# Patient Record
Sex: Female | Born: 1945
Health system: Southern US, Community
[De-identification: ages and names within clinical notes are randomized; demographics above are authoritative.]

## PROBLEM LIST (undated history)

## (undated) DIAGNOSIS — I219 Acute myocardial infarction, unspecified: Secondary | ICD-10-CM

## (undated) DIAGNOSIS — Z972 Presence of dental prosthetic device (complete) (partial): Secondary | ICD-10-CM

## (undated) DIAGNOSIS — G9340 Encephalopathy, unspecified: Secondary | ICD-10-CM

## (undated) DIAGNOSIS — I10 Essential (primary) hypertension: Secondary | ICD-10-CM

## (undated) DIAGNOSIS — E039 Hypothyroidism, unspecified: Secondary | ICD-10-CM

## (undated) DIAGNOSIS — E785 Hyperlipidemia, unspecified: Secondary | ICD-10-CM

## (undated) DIAGNOSIS — I4891 Unspecified atrial fibrillation: Secondary | ICD-10-CM

## (undated) DIAGNOSIS — T4145XA Adverse effect of unspecified anesthetic, initial encounter: Secondary | ICD-10-CM

## (undated) DIAGNOSIS — I639 Cerebral infarction, unspecified: Secondary | ICD-10-CM

## (undated) DIAGNOSIS — J45909 Unspecified asthma, uncomplicated: Secondary | ICD-10-CM

## (undated) DIAGNOSIS — T792XXA Traumatic secondary and recurrent hemorrhage and seroma, initial encounter: Secondary | ICD-10-CM

## (undated) DIAGNOSIS — K219 Gastro-esophageal reflux disease without esophagitis: Secondary | ICD-10-CM

## (undated) DIAGNOSIS — R7611 Nonspecific reaction to tuberculin skin test without active tuberculosis: Secondary | ICD-10-CM

## (undated) DIAGNOSIS — D649 Anemia, unspecified: Secondary | ICD-10-CM

## (undated) DIAGNOSIS — N186 End stage renal disease: Secondary | ICD-10-CM

## (undated) DIAGNOSIS — C801 Malignant (primary) neoplasm, unspecified: Secondary | ICD-10-CM

## (undated) DIAGNOSIS — R569 Unspecified convulsions: Secondary | ICD-10-CM

## (undated) DIAGNOSIS — I2510 Atherosclerotic heart disease of native coronary artery without angina pectoris: Secondary | ICD-10-CM

## (undated) DIAGNOSIS — I739 Peripheral vascular disease, unspecified: Secondary | ICD-10-CM

## (undated) DIAGNOSIS — G4730 Sleep apnea, unspecified: Secondary | ICD-10-CM

## (undated) DIAGNOSIS — I272 Pulmonary hypertension, unspecified: Secondary | ICD-10-CM

## (undated) HISTORY — PX: OTHER SURGICAL HISTORY: SHX169

## (undated) HISTORY — PX: NEPHRECTOMY: SHX65

## (undated) HISTORY — DX: Hyperlipidemia, unspecified: E78.5

## (undated) HISTORY — PX: MULTIPLE TOOTH EXTRACTIONS: SHX2053

## (undated) HISTORY — DX: Hypothyroidism, unspecified: E03.9

## (undated) HISTORY — PX: CHOLECYSTECTOMY: SHX55

## (undated) HISTORY — DX: Peripheral vascular disease, unspecified: I73.9

## (undated) HISTORY — PX: COLONOSCOPY: SHX174

## (undated) HISTORY — PX: CARDIAC CATHETERIZATION: SHX172

## (undated) HISTORY — PX: ABDOMINAL HYSTERECTOMY: SHX81

## (undated) HISTORY — DX: Unspecified convulsions: R56.9

## (undated) HISTORY — DX: Cerebral infarction, unspecified: I63.9

## (undated) HISTORY — PX: TONSILLECTOMY: SUR1361

## (undated) HISTORY — DX: Atherosclerotic heart disease of native coronary artery without angina pectoris: I25.10

## (undated) HISTORY — PX: TUBAL LIGATION: SHX77

## (undated) HISTORY — PX: APPENDECTOMY: SHX54

## (undated) HISTORY — DX: Malignant (primary) neoplasm, unspecified: C80.1

## (undated) HISTORY — DX: Nonspecific reaction to tuberculin skin test without active tuberculosis: R76.11

---

## 2001-01-02 DIAGNOSIS — I639 Cerebral infarction, unspecified: Secondary | ICD-10-CM

## 2001-01-02 HISTORY — DX: Cerebral infarction, unspecified: I63.9

## 2005-03-21 ENCOUNTER — Encounter: Payer: Self-pay | Admitting: Cardiology

## 2005-04-11 ENCOUNTER — Encounter: Payer: Self-pay | Admitting: Cardiology

## 2005-05-23 ENCOUNTER — Encounter: Payer: Self-pay | Admitting: Cardiology

## 2005-07-22 ENCOUNTER — Encounter: Payer: Self-pay | Admitting: Cardiology

## 2006-06-08 ENCOUNTER — Ambulatory Visit: Payer: Self-pay | Admitting: Nephrology

## 2007-02-19 ENCOUNTER — Encounter: Payer: Self-pay | Admitting: Cardiology

## 2008-02-19 ENCOUNTER — Encounter: Payer: Self-pay | Admitting: Cardiology

## 2008-05-22 ENCOUNTER — Encounter: Payer: Self-pay | Admitting: Cardiology

## 2008-07-20 ENCOUNTER — Encounter: Payer: Self-pay | Admitting: Cardiology

## 2008-07-22 ENCOUNTER — Ambulatory Visit: Payer: Self-pay | Admitting: Cardiology

## 2008-07-24 ENCOUNTER — Ambulatory Visit: Payer: Self-pay | Admitting: Cardiology

## 2008-07-27 DIAGNOSIS — I739 Peripheral vascular disease, unspecified: Secondary | ICD-10-CM | POA: Insufficient documentation

## 2008-07-27 DIAGNOSIS — F17200 Nicotine dependence, unspecified, uncomplicated: Secondary | ICD-10-CM

## 2008-07-27 DIAGNOSIS — N186 End stage renal disease: Secondary | ICD-10-CM

## 2008-07-27 DIAGNOSIS — Z8673 Personal history of transient ischemic attack (TIA), and cerebral infarction without residual deficits: Secondary | ICD-10-CM | POA: Insufficient documentation

## 2008-07-27 DIAGNOSIS — I2510 Atherosclerotic heart disease of native coronary artery without angina pectoris: Secondary | ICD-10-CM

## 2008-07-27 HISTORY — DX: End stage renal disease: N18.6

## 2008-07-28 LAB — CONVERTED CEMR LAB
ALT: 21 units/L (ref 0–35)
AST: 21 units/L (ref 0–37)
Albumin: 3.4 g/dL — ABNORMAL LOW (ref 3.5–5.2)
Alkaline Phosphatase: 122 units/L — ABNORMAL HIGH (ref 39–117)
Bilirubin, Direct: 0.1 mg/dL (ref 0.0–0.3)
Cholesterol: 166 mg/dL (ref 0–200)
HDL: 49.1 mg/dL (ref >39.00)
LDL Cholesterol: 97 mg/dL (ref 0–99)
Total Bilirubin: 0.6 mg/dL (ref 0.3–1.2)
Total CHOL/HDL Ratio: 3
Total Protein: 6.9 g/dL (ref 6.0–8.3)
Triglycerides: 98 mg/dL (ref 0.0–149.0)
VLDL: 19.6 mg/dL (ref 0.0–40.0)

## 2008-10-07 ENCOUNTER — Encounter: Payer: Self-pay | Admitting: Cardiology

## 2008-10-08 ENCOUNTER — Encounter (INDEPENDENT_AMBULATORY_CARE_PROVIDER_SITE_OTHER): Payer: Self-pay | Admitting: *Deleted

## 2008-10-13 ENCOUNTER — Encounter: Payer: Self-pay | Admitting: Cardiology

## 2008-10-26 ENCOUNTER — Ambulatory Visit: Payer: Self-pay | Admitting: Cardiovascular Disease

## 2008-10-26 LAB — CONVERTED CEMR LAB: POC INR: 2.9

## 2008-10-28 ENCOUNTER — Ambulatory Visit: Payer: Self-pay | Admitting: Cardiology

## 2008-10-29 ENCOUNTER — Telehealth (INDEPENDENT_AMBULATORY_CARE_PROVIDER_SITE_OTHER): Payer: Self-pay | Admitting: *Deleted

## 2008-11-02 ENCOUNTER — Encounter: Payer: Self-pay | Admitting: Cardiology

## 2008-11-03 ENCOUNTER — Ambulatory Visit: Payer: Self-pay | Admitting: Cardiology

## 2008-11-03 LAB — CONVERTED CEMR LAB: POC INR: 2.5

## 2008-11-17 ENCOUNTER — Ambulatory Visit: Payer: Self-pay | Admitting: Cardiology

## 2008-11-17 LAB — CONVERTED CEMR LAB: POC INR: 1.3

## 2008-11-24 ENCOUNTER — Ambulatory Visit: Payer: Self-pay | Admitting: Cardiovascular Disease

## 2008-11-24 LAB — CONVERTED CEMR LAB: POC INR: 2.5

## 2008-12-08 ENCOUNTER — Ambulatory Visit: Payer: Self-pay | Admitting: Internal Medicine

## 2008-12-08 LAB — CONVERTED CEMR LAB: POC INR: 2.1

## 2009-01-05 ENCOUNTER — Ambulatory Visit: Payer: Self-pay | Admitting: Cardiovascular Disease

## 2009-01-05 LAB — CONVERTED CEMR LAB: POC INR: 2

## 2009-01-28 ENCOUNTER — Inpatient Hospital Stay (HOSPITAL_COMMUNITY): Admission: EM | Admit: 2009-01-28 | Discharge: 2009-02-02 | Payer: Self-pay | Admitting: Emergency Medicine

## 2009-01-28 ENCOUNTER — Ambulatory Visit: Payer: Self-pay | Admitting: Cardiology

## 2009-01-31 ENCOUNTER — Encounter (INDEPENDENT_AMBULATORY_CARE_PROVIDER_SITE_OTHER): Payer: Self-pay | Admitting: Nephrology

## 2009-02-23 ENCOUNTER — Ambulatory Visit: Payer: Self-pay | Admitting: Cardiology

## 2009-02-23 LAB — CONVERTED CEMR LAB: POC INR: 1.6

## 2009-03-09 ENCOUNTER — Ambulatory Visit: Payer: Self-pay | Admitting: Cardiovascular Disease

## 2009-03-09 LAB — CONVERTED CEMR LAB: POC INR: 3

## 2009-03-11 ENCOUNTER — Ambulatory Visit: Payer: Medicare Other | Admitting: Vascular Surgery

## 2009-03-17 ENCOUNTER — Emergency Department: Payer: Self-pay | Admitting: Emergency Medicine

## 2009-03-30 ENCOUNTER — Ambulatory Visit: Payer: Self-pay | Admitting: Cardiology

## 2009-03-30 LAB — CONVERTED CEMR LAB: POC INR: 1.7

## 2009-04-20 ENCOUNTER — Ambulatory Visit: Payer: Self-pay | Admitting: Cardiovascular Disease

## 2009-04-20 LAB — CONVERTED CEMR LAB: POC INR: 2

## 2009-04-22 ENCOUNTER — Ambulatory Visit: Payer: Medicare Other | Admitting: Internal Medicine

## 2009-05-05 ENCOUNTER — Emergency Department (HOSPITAL_COMMUNITY)
Admission: EM | Admit: 2009-05-05 | Discharge: 2009-05-05 | Payer: Self-pay | Source: Home / Self Care | Admitting: Emergency Medicine

## 2009-05-10 ENCOUNTER — Telehealth: Payer: Self-pay | Admitting: Cardiology

## 2009-05-11 ENCOUNTER — Ambulatory Visit: Payer: Self-pay | Admitting: Cardiology

## 2009-05-11 ENCOUNTER — Ambulatory Visit: Payer: Self-pay

## 2009-05-11 ENCOUNTER — Encounter: Payer: Self-pay | Admitting: Cardiovascular Disease

## 2009-05-11 DIAGNOSIS — M79609 Pain in unspecified limb: Secondary | ICD-10-CM

## 2009-05-11 DIAGNOSIS — I10 Essential (primary) hypertension: Secondary | ICD-10-CM | POA: Insufficient documentation

## 2009-05-11 LAB — CONVERTED CEMR LAB
POC INR: 3.58
Prothrombin Time: 36.1 s

## 2009-05-13 ENCOUNTER — Encounter: Payer: Self-pay | Admitting: Cardiology

## 2009-05-18 ENCOUNTER — Encounter: Payer: Self-pay | Admitting: Cardiovascular Disease

## 2009-05-26 ENCOUNTER — Ambulatory Visit: Payer: Self-pay | Admitting: Internal Medicine

## 2009-05-26 LAB — CONVERTED CEMR LAB: POC INR: 2.8

## 2009-06-23 ENCOUNTER — Ambulatory Visit: Payer: Self-pay | Admitting: Cardiology

## 2009-06-23 LAB — CONVERTED CEMR LAB: POC INR: 2.5

## 2009-07-22 ENCOUNTER — Ambulatory Visit: Payer: Self-pay

## 2009-07-22 LAB — CONVERTED CEMR LAB: POC INR: 2.5

## 2009-08-04 ENCOUNTER — Emergency Department (HOSPITAL_COMMUNITY): Admission: EM | Admit: 2009-08-04 | Discharge: 2009-08-04 | Payer: Self-pay | Admitting: Emergency Medicine

## 2009-08-05 ENCOUNTER — Telehealth: Payer: Self-pay | Admitting: Cardiology

## 2009-08-07 ENCOUNTER — Encounter: Admission: RE | Admit: 2009-08-07 | Discharge: 2009-08-07 | Payer: Self-pay | Admitting: Sports Medicine

## 2009-08-13 ENCOUNTER — Encounter: Payer: Self-pay | Admitting: Cardiology

## 2009-08-19 ENCOUNTER — Ambulatory Visit: Payer: Self-pay | Admitting: Internal Medicine

## 2009-08-19 LAB — CONVERTED CEMR LAB: POC INR: 3.2

## 2009-08-29 ENCOUNTER — Emergency Department (HOSPITAL_COMMUNITY): Admission: EM | Admit: 2009-08-29 | Discharge: 2009-08-29 | Payer: Self-pay | Admitting: Emergency Medicine

## 2009-08-31 ENCOUNTER — Encounter (INDEPENDENT_AMBULATORY_CARE_PROVIDER_SITE_OTHER): Payer: Self-pay | Admitting: Urology

## 2009-08-31 ENCOUNTER — Inpatient Hospital Stay (HOSPITAL_COMMUNITY): Admission: RE | Admit: 2009-08-31 | Discharge: 2009-09-03 | Payer: Self-pay | Admitting: Urology

## 2009-09-01 ENCOUNTER — Encounter: Payer: Self-pay | Admitting: Cardiology

## 2009-09-02 ENCOUNTER — Ambulatory Visit: Payer: Self-pay | Admitting: Oncology

## 2009-09-07 ENCOUNTER — Encounter: Payer: Self-pay | Admitting: Cardiology

## 2009-09-28 ENCOUNTER — Encounter: Payer: Self-pay | Admitting: Cardiology

## 2009-09-28 ENCOUNTER — Ambulatory Visit: Payer: Self-pay | Admitting: Cardiovascular Disease

## 2009-09-28 LAB — CONVERTED CEMR LAB: POC INR: 4

## 2009-10-10 ENCOUNTER — Emergency Department (HOSPITAL_COMMUNITY): Admission: EM | Admit: 2009-10-10 | Discharge: 2009-10-10 | Payer: Self-pay | Admitting: Emergency Medicine

## 2009-10-12 ENCOUNTER — Ambulatory Visit: Payer: Self-pay | Admitting: Internal Medicine

## 2009-10-12 LAB — CONVERTED CEMR LAB: POC INR: 4.1

## 2009-10-26 ENCOUNTER — Ambulatory Visit: Payer: Self-pay | Admitting: Cardiovascular Disease

## 2009-10-26 LAB — CONVERTED CEMR LAB: POC INR: 2.5

## 2009-11-09 ENCOUNTER — Ambulatory Visit: Payer: Self-pay | Admitting: Internal Medicine

## 2009-11-09 LAB — CONVERTED CEMR LAB: POC INR: 1.4

## 2009-11-14 ENCOUNTER — Encounter: Payer: Self-pay | Admitting: Cardiology

## 2009-11-14 ENCOUNTER — Emergency Department (HOSPITAL_COMMUNITY): Admission: EM | Admit: 2009-11-14 | Discharge: 2009-11-14 | Payer: Self-pay | Admitting: Emergency Medicine

## 2009-11-15 ENCOUNTER — Encounter: Payer: Self-pay | Admitting: Cardiology

## 2009-11-16 ENCOUNTER — Ambulatory Visit: Payer: Self-pay | Admitting: Cardiovascular Disease

## 2009-11-16 ENCOUNTER — Ambulatory Visit: Payer: Self-pay | Admitting: Cardiology

## 2010-01-06 ENCOUNTER — Encounter
Admission: RE | Admit: 2010-01-06 | Discharge: 2010-01-06 | Payer: Self-pay | Source: Home / Self Care | Attending: Internal Medicine | Admitting: Internal Medicine

## 2010-01-23 ENCOUNTER — Encounter: Payer: Self-pay | Admitting: Internal Medicine

## 2010-02-01 NOTE — Letter (Signed)
Summary: [facility]  [facility]   Imported By: Kassie Mends 03/15/2009 13:08:23  _____________________________________________________________________  External Attachment:    Type:   Image     Comment:   External Document

## 2010-02-01 NOTE — Medication Information (Signed)
Summary: rov/tm  Anticoagulant Therapy  Managed by: Bethena Midget, RN, BSN Referring MD: Angelina Sheriff, MD PCP: None Supervising MD: Graciela Husbands MD, Viviann Spare Indication 1: CVA Lab Used: [facility] [place] Site: [address] INR POC 1.4 INR RANGE 2.0-3.0  Dietary changes: no    Health status changes: no    Bleeding/hemorrhagic complications: no    Recent/future hospitalizations: no    Any changes in medication regimen? no    Recent/future dental: no  Any missed doses?: no       Is patient compliant with meds? yes      Comments: Sees Dr Antoine Poche next wk and want check INR at that time.  Allergies: 1)  ! Pcn 2)  ! Ace Inhibitors 3)  ! Demerol 4)  ! * Ivp Dye 5)  ! Morphine 6)  ! * Tb Med 7)  ! Talwin 8)  ! Dilantin 9)  ! Asa 10)  ! Heparin  Anticoagulation Management History:      The patient is taking warfarin and comes in today for a routine follow up visit.  Positive risk factors for bleeding include an age of 22 years or older and history of CVA/TIA.  The bleeding index is 'intermediate risk'.  Positive CHADS2 values include History of CHF, History of HTN, and Prior Stroke/CVA/TIA.  Negative CHADS2 values include Age > 37 years old.  Anticoagulation responsible provider: Graciela Husbands MD, Viviann Spare.  INR POC: 1.4.  Cuvette Lot#: [id number].  Exp: 11/2010.    Anticoagulation Management Assessment/Plan:      The patient's current anticoagulation dose is Warfarin sodium 10 mg tabs: Use as directed by Anticoagulation Clinic.  The target INR is 2.0-3.0.  The next INR is due 11/16/2009.  Anticoagulation instructions were given to patient.  Results were reviewed/authorized by Bethena Midget, RN, BSN.  She was notified by Bethena Midget, RN, BSN.         Prior Anticoagulation Instructions: INR 2.5 Continue 10mg s daily except 5mg s on Mondays and Fridays. Recheck in 2 weeks.   Current Anticoagulation Instructions: INR 1.4  Today take 1 1/2 tablets then change dose to 1 tablet  everyday except 1/2  tablet on Mondays. Recheck in 2 weeks.

## 2010-02-01 NOTE — Medication Information (Signed)
Summary: rov/ewj  Anticoagulant Therapy  Managed by: Weston Brass, PharmD Referring MD: Angelina Sheriff, MD PCP: None Supervising MD: Gala Romney MD, Reuel Boom Indication 1: CVA Lab Used: [facility] [place] Site: [address] INR POC 3.2 INR RANGE 2.0-3.0  Dietary changes: no     Bleeding/hemorrhagic complications: no    Recent/future hospitalizations: yes       Details: Having kidney removed on Aug. 30.  Will stop Coumadin on Aug. 25.  Any changes in medication regimen? no    Recent/future dental: no  Any missed doses?: no       Is patient compliant with meds? yes       Allergies: 1)  ! Pcn 2)  ! Ace Inhibitors 3)  ! Demerol 4)  ! * Ivp Dye 5)  ! Morphine 6)  ! * Tb Med 7)  ! Talwin 8)  ! Dilantin 9)  ! Asa 10)  ! Heparin  Anticoagulation Management History:      The patient is taking warfarin and comes in today for a routine follow up visit.  Positive risk factors for bleeding include an age of 66 years or older and history of CVA/TIA.  The bleeding index is 'intermediate risk'.  Positive CHADS2 values include History of CHF, History of HTN, and Prior Stroke/CVA/TIA.  Negative CHADS2 values include Age > 21 years old.  Anticoagulation responsible provider: Bensimhon MD, Reuel Boom.  INR POC: 3.2.  Cuvette Lot#: [id number].  Exp: 10/2010.    Anticoagulation Management Assessment/Plan:      The patient's current anticoagulation dose is Warfarin sodium 10 mg tabs: Use as directed by Anticoagulation Clinic.  The target INR is 2.0-3.0.  The next INR is due 09/16/2009.  Anticoagulation instructions were given to patient.  Results were reviewed/authorized by Weston Brass, PharmD.  She was notified by Liana Gerold, PharmD Candidate.         Prior Anticoagulation Instructions: INR 2.5  Continue on same dosage 10mg  daily.   Recheck in 4 weeks.    Current Anticoagulation Instructions: INR 3.2   Skip today's dose, then resume 1 tablet daily.  Return to clinic in 4  weeks.

## 2010-02-01 NOTE — Miscellaneous (Signed)
  Clinical Lists Changes  Observations: Added new observation of DOPPLER LEG: No evidence of right lower extremity deep or supraficial venous thrombus or incompetence (05/11/2009 9:28)      Venous Doppler  Procedure date:  05/11/2009  Findings:      No evidence of right lower extremity deep or supraficial venous thrombus or incompetence

## 2010-02-01 NOTE — Medication Information (Signed)
Summary: ccr  Anticoagulant Therapy  Managed by: Elaina Pattee, PharmD Referring MD: Angelina Sheriff, MD PCP: None Supervising MD: Jens Som MD, Arlys John Indication 1: CVA Lab Used: [facility] [place] Site: [address] INR POC 2.5 INR RANGE 2.0-3.0  Dietary changes: no    Health status changes: no    Bleeding/hemorrhagic complications: no    Recent/future hospitalizations: no    Any changes in medication regimen? yes       Details: DC clonidine.  Recent/future dental: no  Any missed doses?: no       Is patient compliant with meds? yes       Allergies: 1)  ! Pcn 2)  ! Ace Inhibitors 3)  ! Demerol 4)  ! * Ivp Dye 5)  ! Morphine 6)  ! * Tb Med 7)  ! Talwin 8)  ! Dilantin 9)  ! Asa 10)  ! Heparin  Anticoagulation Management History:      The patient is taking warfarin and comes in today for a routine follow up visit.  Positive risk factors for bleeding include an age of 75 years or older and history of CVA/TIA.  The bleeding index is 'intermediate risk'.  Positive CHADS2 values include History of CHF, History of HTN, and Prior Stroke/CVA/TIA.  Negative CHADS2 values include Age > 27 years old.  Anticoagulation responsible provider: Jens Som MD, Arlys John.  INR POC: 2.5.  Cuvette Lot#: [id number].  Exp: 08/2010.    Anticoagulation Management Assessment/Plan:      The patient's current anticoagulation dose is Warfarin sodium 10 mg tabs: Use as directed by Anticoagulation Clinic.  The target INR is 2.0-3.0.  The next INR is due 07/21/2009.  Anticoagulation instructions were given to patient.  Results were reviewed/authorized by Elaina Pattee, PharmD.  She was notified by Elaina Pattee, PharmD.         Prior Anticoagulation Instructions: INR 2.8  Continue same dose of 10mg  daily.   Current Anticoagulation Instructions: INR 2.5. Take 1 tablet daily. Recheck in 4 weeks.

## 2010-02-01 NOTE — Medication Information (Signed)
Summary: rov/tm  Anticoagulant Therapy  Managed by: Inactive Referring MD: Angelina Sheriff, MD PCP: None Supervising MD: Antoine Poche MD, Fayrene Fearing Indication 1: CVA Lab Used: [facility] [place] Site: [address] INR RANGE 2.0-3.0          Comments: Dr Antoine Poche today at his visit discontinued pt coumadin  Allergies: 1)  ! Pcn 2)  ! Ace Inhibitors 3)  ! Demerol 4)  ! * Ivp Dye 5)  ! Morphine 6)  ! * Tb Med 7)  ! Talwin 8)  ! Dilantin 9)  ! Asa 10)  ! Heparin  Anticoagulation Management History:      Positive risk factors for bleeding include an age of 32 years or older and history of CVA/TIA.  The bleeding index is 'intermediate risk'.  Positive CHADS2 values include History of CHF, History of HTN, and Prior Stroke/CVA/TIA.  Negative CHADS2 values include Age > 36 years old.  Anticoagulation responsible provider: Antoine Poche MD, Fayrene Fearing.  Exp: 11/2010.    Anticoagulation Management Assessment/Plan:      The patient's current anticoagulation dose is Warfarin sodium 10 mg tabs: Use as directed by Anticoagulation Clinic.  The target INR is 2.0-3.0.  The next INR is due 11/16/2009.  Anticoagulation instructions were given to patient.  Results were reviewed/authorized by Inactive.         Prior Anticoagulation Instructions: INR 1.4  Today take 1 1/2 tablets then change dose to 1 tablet everyday except 1/2  tablet on Mondays. Recheck in 2 weeks.

## 2010-02-01 NOTE — Medication Information (Signed)
Summary: rov/tm  Anticoagulant Therapy  Managed by: Cloyde Reams, RN, BSN Referring MD: Angelina Sheriff, MD PCP: Dr. Rolly Pancake [facility]) Supervising MD: Eden Emms MD, Theron Arista Indication 1: CVA Lab Used: [facility] [place] Site: [address] INR POC 2.0 INR RANGE 2.0-3.0  Dietary changes: yes       Details: Incr amt in vit K over holidays.    Health status changes: no    Bleeding/hemorrhagic complications: no    Recent/future hospitalizations: no    Any changes in medication regimen? no    Recent/future dental: no  Any missed doses?: no       Is patient compliant with meds? yes       Allergies (verified): 1)  ! Pcn 2)  ! Ace Inhibitors 3)  ! Demerol 4)  ! * Ivp Dye 5)  ! Morphine 6)  ! * Tb Med 7)  ! Talwin 8)  ! Dilantin 9)  ! Asa 10)  ! Heparin  Anticoagulation Management History:      The patient is taking warfarin and comes in today for a routine follow up visit.  Positive risk factors for bleeding include history of CVA/TIA.  Negative risk factors for bleeding include an age less than 18 years old.  The bleeding index is 'intermediate risk'.  Positive CHADS2 values include Prior Stroke/CVA/TIA.  Negative CHADS2 values include Age > 20 years old.  Anticoagulation responsible provider: Eden Emms MD, Theron Arista.  INR POC: 2.0.  Cuvette Lot#: [id number].  Exp: 02/2010.    Anticoagulation Management Assessment/Plan:      The patient's current anticoagulation dose is Warfarin sodium 10 mg tabs: Use as directed by Anticoagulation Clinic.  The next INR is due 02/02/2009.  Anticoagulation instructions were given to patient.  Results were reviewed/authorized by Cloyde Reams, RN, BSN.  She was notified by Cloyde Reams RN.         Prior Anticoagulation Instructions: INR 2.1 Continue 10mg s everyday. Recheck in 4 weeks.   Current Anticoagulation Instructions: INR 2.0  Continue on same dosage 10mg  daily.  Recheck in 4 weeks.

## 2010-02-01 NOTE — Medication Information (Signed)
Summary: rov/eac  Anticoagulant Therapy  Managed by: Bethena Midget, RN, BSN Referring MD: Angelina Sheriff, MD PCP: Dr. Rolly Pancake [facility]) Supervising MD: Eden Emms MD, Theron Arista Indication 1: CVA Lab Used: [facility] [place] Site: [address] INR POC 3.0 INR RANGE 2.0-3.0  Dietary changes: yes       Details: Hasn't been eating normal green leafy vegetables.   Health status changes: no    Bleeding/hemorrhagic complications: no    Recent/future hospitalizations: no    Any changes in medication regimen? no    Recent/future dental: no  Any missed doses?: no       Is patient compliant with meds? yes       Current Medications (verified): 1)  Levothroid 88 Mcg Tabs (Levothyroxine Sodium) .... Take One Tablet By Mouth Once Daily. 2)  Warfarin Sodium 10 Mg Tabs (Warfarin Sodium) .... Use As Directed By Anticoagulation Clinic 3)  Oxcarbazepine 300 Mg Tabs (Oxcarbazepine) .... Take One Tablet By Mouth Twice Daily. 4)  Hydralazine Hcl 25 Mg Tabs (Hydralazine Hcl) .... Take One Tablet By Mouth Three Times A Day 5)  Amlodipine Besylate 10 Mg Tabs (Amlodipine Besylate) .... Take One Tablet By Mouth Daily 6)  Welchol 625 Mg Tabs (Colesevelam Hcl) .... Take 3 Tablets By Mouth Twice A Day With Meals 7)  Renvela 800 Mg Tabs (Sevelamer Carbonate) .... 2 With Every Meal or Snack. 8)  Pravastatin Sodium 40 Mg Tabs (Pravastatin Sodium) .... Take 2  Tablets By Mouth Daily At Bedtime 9)  Citalopram Hydrobromide 20 Mg Tabs (Citalopram Hydrobromide) .... Take One Tablet By Mouth Once Daily. 10)  Hydrocodone-Acetaminophen 5-500 Mg Tabs (Hydrocodone-Acetaminophen) .... As Needed 11)  Lorazepam 2 Mg Tabs (Lorazepam) .... As Needed 12)  Rena-Vite  Tabs (B Complex-C-Folic Acid) .[name].. 1 Tablet By Mouth Daily 13)  Aspirin 81 Mg Tbec (Aspirin) .... Take One Tablet By Mouth Daily  Allergies: 1)  ! Pcn 2)  ! Ace Inhibitors 3)  ! Demerol 4)  ! * Ivp Dye 5)  ! Morphine 6)  ! * Tb Med 7)   ! Talwin 8)  ! Dilantin 9)  ! Asa 10)  ! Heparin  Anticoagulation Management History:      The patient is taking warfarin and comes in today for a routine follow up visit.  Positive risk factors for bleeding include history of CVA/TIA.  Negative risk factors for bleeding include an age less than 1 years old.  The bleeding index is 'intermediate risk'.  Positive CHADS2 values include Prior Stroke/CVA/TIA.  Negative CHADS2 values include Age > 58 years old.  Anticoagulation responsible provider: Eden Emms MD, Theron Arista.  INR POC: 3.0.  Cuvette Lot#: [id number].  Exp: 04/2010.    Anticoagulation Management Assessment/Plan:      The patient's current anticoagulation dose is Warfarin sodium 10 mg tabs: Use as directed by Anticoagulation Clinic.  The target INR is 2.0-3.0.  The next INR is due 03/30/2009.  Anticoagulation instructions were given to patient.  Results were reviewed/authorized by Bethena Midget, RN, BSN.  She was notified by Bethena Midget, RN, BSN.         Prior Anticoagulation Instructions: INR 1.6  Take 1.5 tablets today.  Then return to normal dosing schedule of 10 mg daily.  Return to clinic in 2 weels.  Current Anticoagulation Instructions: INR 3.0 Continue 10mg  everyday. Recheck in 3 weeks.

## 2010-02-01 NOTE — Medication Information (Signed)
Summary: ccr  Anticoagulant Therapy  Managed by: Weston Brass, PharmD Referring MD: Angelina Sheriff, MD PCP: None Supervising MD: Eden Emms MD, Theron Arista Indication 1: CVA Lab Used: [facility] [place] Site: [address] INR POC 4.0 INR RANGE 2.0-3.0  Dietary changes: yes       Details: not had many greens due to decreased appetite  Health status changes: no    Bleeding/hemorrhagic complications: no    Recent/future hospitalizations: yes       Details: kidney removed august 30th   Any changes in medication regimen? no    Recent/future dental: no  Any missed doses?: yes     Details: was taken off coumadin for 5 days before surgery, but has now been back on coumadin several weeks.    Allergies: 1)  ! Pcn 2)  ! Ace Inhibitors 3)  ! Demerol 4)  ! * Ivp Dye 5)  ! Morphine 6)  ! * Tb Med 7)  ! Talwin 8)  ! Dilantin 9)  ! Asa 10)  ! Heparin  Anticoagulation Management History:      The patient is taking warfarin and comes in today for a routine follow up visit.  Positive risk factors for bleeding include an age of 66 years or older and history of CVA/TIA.  The bleeding index is 'intermediate risk'.  Positive CHADS2 values include History of CHF, History of HTN, and Prior Stroke/CVA/TIA.  Negative CHADS2 values include Age > 66 years old.  Anticoagulation responsible Eliyana Pagliaro: Eden Emms MD, Theron Arista.  INR POC: 4.0.  Cuvette Lot#: [id number].  Exp: 11/2010.    Anticoagulation Management Assessment/Plan:      The patient's current anticoagulation dose is Warfarin sodium 10 mg tabs: Use as directed by Anticoagulation Clinic.  The target INR is 2.0-3.0.  The next INR is due 10/12/2009.  Anticoagulation instructions were given to patient.  Results were reviewed/authorized by Weston Brass, PharmD.  She was notified by Kennieth Francois.         Prior Anticoagulation Instructions: INR 3.2   Skip today's dose, then resume 1 tablet daily.  Return to clinic in 4 weeks.  Current  Anticoagulation Instructions: INR 4.0  Skip today's dose, and then begin new dose of one tablet every day except for one-half tablet on Friday.  Recheck in two weeks. Prescriptions: WARFARIN SODIUM 10 MG TABS (WARFARIN SODIUM) Use as directed by Anticoagulation Clinic  #90 x 0   Entered by:   Lyna Poser PharmD   Authorized by:   Rollene Rotunda, MD, [facility]   Signed by:   Lyna Poser PharmD on 09/28/2009   Method used:   Electronically to        [facility] [phone]* (retail)       [address]       [place], [place]  [address]       Ph: [phone]       Fax: [phone]   RxID:   [id number]

## 2010-02-01 NOTE — Letter (Signed)
Summary: Clearance Letter  [facility], Main Office  [address]   [place], [place] [address]   Phone: [phone]  Fax: [phone]    August 13, 2009  Re:     [facility] Address:   [address] [place], [place]  [address] DOB:     08-17-1945 MRN:     [id number]   Dear            Sincerely,  Charolotte Capuchin, RN
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
## 2010-02-01 NOTE — Medication Information (Signed)
Summary: rov/ewj  Anticoagulant Therapy  Managed by: Elaina Pattee, PharmD Referring MD: Angelina Sheriff, MD PCP: Dr. Rolly Pancake [facility]) Supervising MD: Eden Emms MD, Theron Arista Indication 1: CVA Lab Used: [facility] [place] Site: [address] INR POC 2.0 INR RANGE 2.0-3.0  Dietary changes: no    Health status changes: no    Bleeding/hemorrhagic complications: no    Recent/future hospitalizations: no    Any changes in medication regimen? no    Recent/future dental: no  Any missed doses?: no       Is patient compliant with meds? yes       Allergies: 1)  ! Pcn 2)  ! Ace Inhibitors 3)  ! Demerol 4)  ! * Ivp Dye 5)  ! Morphine 6)  ! * Tb Med 7)  ! Talwin 8)  ! Dilantin 9)  ! Asa 10)  ! Heparin  Anticoagulation Management History:      The patient is taking warfarin and comes in today for a routine follow up visit.  Positive risk factors for bleeding include history of CVA/TIA.  Negative risk factors for bleeding include an age less than 63 years old.  The bleeding index is 'intermediate risk'.  Positive CHADS2 values include Prior Stroke/CVA/TIA.  Negative CHADS2 values include Age > 29 years old.  Anticoagulation responsible provider: Eden Emms MD, Theron Arista.  INR POC: 2.0.  Cuvette Lot#: [id number].  Exp: 05/2010.    Anticoagulation Management Assessment/Plan:      The patient's current anticoagulation dose is Warfarin sodium 10 mg tabs: Use as directed by Anticoagulation Clinic.  The target INR is 2.0-3.0.  The next INR is due 05/18/2009.  Anticoagulation instructions were given to patient.  Results were reviewed/authorized by Elaina Pattee, PharmD.  She was notified by Elaina Pattee, PharmD.         Prior Anticoagulation Instructions: INR 1.7  Take 15mg  today, then resume same dosage 10mg  daily.  Recheck in 3 weeks.    Current Anticoagulation Instructions: INR 2.0. Take 1 tablet daily. Recheck in 4 weeks.

## 2010-02-01 NOTE — Letter (Signed)
Summary: Clearance Letter  [facility], Main Office  [address]   [place], [place] [address]   Phone: [phone]  Fax: [phone]    August 13, 2009  Re:     [facility] Address:   [address] [place], [place]  [address] DOB:     [birth year] MRN:     [id number]   Dear Dr. Ihor Gully,  The above patient is at acceptable risk for surgery and may hold Coumadin as needed.        Sincerely,     Dr Rollene Rotunda Charolotte Capuchin, RN

## 2010-02-01 NOTE — Medication Information (Signed)
Summary: rov/cs  Anticoagulant Therapy  Managed by: Bethena Midget, RN, BSN Referring MD: Angelina Sheriff, MD PCP: None Supervising MD: Eden Emms MD, Theron Arista Indication 1: CVA Lab Used: [facility] [place] Site: [address] INR POC 2.5 INR RANGE 2.0-3.0  Dietary changes: no    Health status changes: no    Bleeding/hemorrhagic complications: no    Recent/future hospitalizations: no    Any changes in medication regimen? no    Recent/future dental: no  Any missed doses?: no       Is patient compliant with meds? yes       Allergies: 1)  ! Pcn 2)  ! Ace Inhibitors 3)  ! Demerol 4)  ! * Ivp Dye 5)  ! Morphine 6)  ! * Tb Med 7)  ! Talwin 8)  ! Dilantin 9)  ! Asa 10)  ! Heparin  Anticoagulation Management History:      The patient is taking warfarin and comes in today for a routine follow up visit.  Positive risk factors for bleeding include an age of 66 years or older and history of CVA/TIA.  The bleeding index is 'intermediate risk'.  Positive CHADS2 values include History of CHF, History of HTN, and Prior Stroke/CVA/TIA.  Negative CHADS2 values include Age > 66 years old.  Anticoagulation responsible Jody Silas: Eden Emms MD, Theron Arista.  INR POC: 2.5.  Cuvette Lot#: [id number].  Exp: 11/2010.    Anticoagulation Management Assessment/Plan:      The patient's current anticoagulation dose is Warfarin sodium 10 mg tabs: Use as directed by Anticoagulation Clinic.  The target INR is 2.0-3.0.  The next INR is due 11/09/2009.  Anticoagulation instructions were given to patient.  Results were reviewed/authorized by Bethena Midget, RN, BSN.  She was notified by Bethena Midget, RN, BSN.         Prior Anticoagulation Instructions: INR 4.1  Skip todays dose, and then take Coumadin 1 tablet every day of the week, except 1/2 tablet on Monday and Friday.  Return to clinic in 2 weeks.    Current Anticoagulation Instructions: INR 2.5 Continue 10mg s daily except 5mg s on Mondays and Fridays.  Recheck in 2 weeks.

## 2010-02-01 NOTE — Letter (Signed)
Summary: [facility] Office Visit Note   [facility] Office Visit Note   Imported By: Roderic Ovens 09/21/2009 15:53:54  _____________________________________________________________________  External Attachment:    Type:   Image     Comment:   External Document

## 2010-02-01 NOTE — Medication Information (Signed)
Summary: rov/eac  Anticoagulant Therapy  Managed by: Eda Keys, PharmD Referring MD: Angelina Sheriff, MD PCP: Dr. Rolly Pancake [facility]) Supervising MD: Excell Seltzer MD, Casimiro Needle Indication 1: CVA Lab Used: [facility] [place] Site: [address] INR POC 1.6 INR RANGE 2.0-3.0  Dietary changes: no    Health status changes: no    Bleeding/hemorrhagic complications: no    Recent/future hospitalizations: yes       Details: Admit 1/27, d/c 02/02/09  Any changes in medication regimen? yes       Details: ASA 325 mg, pt also to start combivent and albuterol  Recent/future dental: no  Any missed doses?: no       Is patient compliant with meds? yes       Allergies: 1)  ! Pcn 2)  ! Ace Inhibitors 3)  ! Demerol 4)  ! * Ivp Dye 5)  ! Morphine 6)  ! * Tb Med 7)  ! Talwin 8)  ! Dilantin 9)  ! Asa 10)  ! Heparin  Anticoagulation Management History:      The patient is taking warfarin and comes in today for a routine follow up visit.  Positive risk factors for bleeding include history of CVA/TIA.  Negative risk factors for bleeding include an age less than 38 years old.  The bleeding index is 'intermediate risk'.  Positive CHADS2 values include Prior Stroke/CVA/TIA.  Negative CHADS2 values include Age > 49 years old.  Anticoagulation responsible provider: Excell Seltzer MD, Casimiro Needle.  INR POC: 1.6.  Cuvette Lot#: [id number].  Exp: 04/2010.    Anticoagulation Management Assessment/Plan:      The patient's current anticoagulation dose is Warfarin sodium 10 mg tabs: Use as directed by Anticoagulation Clinic.  The next INR is due 03/09/2009.  Anticoagulation instructions were given to patient.  Results were reviewed/authorized by Eda Keys, PharmD.  She was notified by Eda Keys.         Prior Anticoagulation Instructions: INR 2.0  Continue on same dosage 10mg  daily.  Recheck in 4 weeks.    Current Anticoagulation Instructions: INR 1.6  Take 1.5 tablets today.  Then  return to normal dosing schedule of 10 mg daily.  Return to clinic in 2 weels.

## 2010-02-01 NOTE — Letter (Signed)
Summary: [facility] Office Visit Note   [facility] Office Visit Note   Imported By: Roderic Ovens 10/12/2009 16:04:09  _____________________________________________________________________  External Attachment:    Type:   Image     Comment:   External Document

## 2010-02-01 NOTE — Progress Notes (Signed)
Summary: refill  Phone Note Refill Request Message from:  Patient on May 10, 2009 10:57 AM  Refills Requested: Medication #1:  WARFARIN SODIUM 10 MG TABS Use as directed by Anticoagulation Clinic Send to [facility] [phone] pt is out of medication  Initial call taken by: Judie Grieve,  May 10, 2009 10:59 AM    Prescriptions: WARFARIN SODIUM 10 MG TABS (WARFARIN SODIUM) Use as directed by Anticoagulation Clinic  #30 x 3   Entered by:   Weston Brass PharmD   Authorized by:   Rollene Rotunda, MD, [facility]   Signed by:   Weston Brass PharmD on 05/10/2009   Method used:   Electronically to        [facility] [phone]* (retail)       [address]       [place], [place]  [address]       Ph: [phone]       Fax: [phone]   RxID:   [id number]

## 2010-02-01 NOTE — Consult Note (Signed)
Summary: [place]   [place]   Imported By: Roderic Ovens 10/27/2009 12:27:22  _____________________________________________________________________  External Attachment:    Type:   Image     Comment:   External Document

## 2010-02-01 NOTE — Medication Information (Signed)
Summary: rov/cb  Anticoagulant Therapy  Managed by: Cloyde Reams, RN, BSN Referring MD: Angelina Sheriff, MD PCP: None Supervising MD: Myrtis Ser MD, Tinnie Gens Indication 1: CVA Lab Used: [facility] [place] Site: [address] INR POC 2.5 INR RANGE 2.0-3.0  Dietary changes: yes       Details: Eating less vit K  Health status changes: no    Bleeding/hemorrhagic complications: yes       Details: Incr bruising at dialysis graft site  Recent/future hospitalizations: no    Any changes in medication regimen? no    Recent/future dental: no  Any missed doses?: no       Is patient compliant with meds? yes       Allergies: 1)  ! Pcn 2)  ! Ace Inhibitors 3)  ! Demerol 4)  ! * Ivp Dye 5)  ! Morphine 6)  ! * Tb Med 7)  ! Talwin 8)  ! Dilantin 9)  ! Asa 10)  ! Heparin  Anticoagulation Management History:      The patient is taking warfarin and comes in today for a routine follow up visit.  Positive risk factors for bleeding include an age of 65 years or older and history of CVA/TIA.  The bleeding index is 'intermediate risk'.  Positive CHADS2 values include History of CHF, History of HTN, and Prior Stroke/CVA/TIA.  Negative CHADS2 values include Age > 41 years old.  Anticoagulation responsible provider: Myrtis Ser MD, Tinnie Gens.  INR POC: 2.5.  Cuvette Lot#: [id number].  Exp: 10/2010.    Anticoagulation Management Assessment/Plan:      The patient's current anticoagulation dose is Warfarin sodium 10 mg tabs: Use as directed by Anticoagulation Clinic.  The target INR is 2.0-3.0.  The next INR is due 08/19/2009.  Anticoagulation instructions were given to patient.  Results were reviewed/authorized by Cloyde Reams, RN, BSN.  She was notified by Cloyde Reams RN.         Prior Anticoagulation Instructions: INR 2.5. Take 1 tablet daily. Recheck in 4 weeks.  Current Anticoagulation Instructions: INR 2.5  Continue on same dosage 10mg  daily.   Recheck in 4 weeks.

## 2010-02-01 NOTE — Medication Information (Signed)
Summary: rov/sp  Anticoagulant Therapy  Managed by: Weston Brass, PharmD Referring MD: Angelina Sheriff, MD PCP: None Supervising MD: Gala Romney MD, Reuel Boom Indication 1: CVA Lab Used: [facility] [place] Site: [address] INR POC 2.8 INR RANGE 2.0-3.0  Dietary changes: no    Health status changes: no    Bleeding/hemorrhagic complications: yes       Details: some bleeding after dialysis  Recent/future hospitalizations: no    Any changes in medication regimen? yes       Details: decreased clonidine to 1/2 tablet TID  Recent/future dental: no  Any missed doses?: no       Is patient compliant with meds? yes       Allergies: 1)  ! Pcn 2)  ! Ace Inhibitors 3)  ! Demerol 4)  ! * Ivp Dye 5)  ! Morphine 6)  ! * Tb Med 7)  ! Talwin 8)  ! Dilantin 9)  ! Asa 10)  ! Heparin  Anticoagulation Management History:      The patient is taking warfarin and comes in today for a routine follow up visit.  Positive risk factors for bleeding include an age of 66 years or older and history of CVA/TIA.  The bleeding index is 'intermediate risk'.  Positive CHADS2 values include History of CHF, History of HTN, and Prior Stroke/CVA/TIA.  Negative CHADS2 values include Age > 66 years old.  Anticoagulation responsible provider: Laural Eiland MD, Reuel Boom.  INR POC: 2.8.  Cuvette Lot#: [id number].  Exp: 08/2010.    Anticoagulation Management Assessment/Plan:      The patient's current anticoagulation dose is Warfarin sodium 10 mg tabs: Use as directed by Anticoagulation Clinic.  The target INR is 2.0-3.0.  The next INR is due 06/16/2009.  Anticoagulation instructions were given to patient.  Results were reviewed/authorized by Weston Brass, PharmD.  She was notified by Weston Brass PharmD.         Prior Anticoagulation Instructions: INR 3.58   LMOM for pt Weston Brass PharmD  May 18, 2009 11:26 AM  [facility] Weston Brass PharmD  May 19, 2009 10:17 AM   Spoke with pt.  Take 1/2 tablet today then resume same  dose of 1 tablet every day.  Recheck INR in 1 week Weston Brass PharmD  May 19, 2009 4:10 PM   Current Anticoagulation Instructions: INR 2.8  Continue same dose of 10mg  daily.

## 2010-02-01 NOTE — Progress Notes (Signed)
Summary: off coumadin prior to surgery [facility] Review**  Phone Note From Other Clinic   Caller: [facility] OFFICE [phone] ext [phone] . fax [phone] Request: Talk with Nurse Details for Reason: Pt need to come off coumadin 5 day prior to surgery. pt have nephrectomy.  Initial call taken by: Lorne Skeens,  August 05, 2009 10:50 AM  Follow-up for Phone Call        [facility]. Ollen Gross, RN, BSN  August 05, 2009 11:41 AM  Pam called back and the pt is not scheduled for Nephrectomy at this time. Dr Vernie Ammons would like cardiac clearance and clearance for the pt to hold coumadin prior to surgery.  I will forward this information to Dr Antoine Poche to review. Pam is aware that Dr Antoine Poche is out of the office this week. Julieta Gutting, RN, BSN  August 05, 2009 11:47 AM  Additional Follow-up for Phone Call Additional follow up Details #1::        The patient is at acceptable risk for the planned surgery.  I reviewd records.  She was taken off of coumadin for her previous CVA but is in sinus rhythm without evidence of residual clot identified.  OK to hold coumadin as needed for surgery. Additional Follow-up by: Rollene Rotunda, MD, [facility],  August 08, 2009 10:23 PM

## 2010-02-01 NOTE — Assessment & Plan Note (Signed)
Summary: 6 MONTH ROV 414.01 272.4 PFH,RN   Visit Type:  Follow-up Primary Provider:  None  CC:  Diastolic HF.  History of Present Illness: The patient presents for followup after a hospitalization in January for volume overload. She missed her dialysis appointment and actually had a slight troponin elevation. She was volume overloaded. I have reviewed these results. Her echocardiogram demonstrated a very small pericardial effusion. She had a well-preserved systolic function but some evidence of diastolic dysfunction. She did have a cardiac catheterization demonstrating a normal LAD, circumflex posterior lateral 50% and a previously stented right coronary artery with an ostial 50% stenosis in the stented area. She was managed medically.  She did recently presented to the emergency room with a right leg swelling. She doesn't remember trauma but there is an inflamed slightly indurated area in the anterior tibial region. There is some calf tenderness and swelling in the leg. She was noted to have a therapeutic INR in the ER and no further imaging was undertaken. I reviewed these results. However, she's continued to have tenderness and swelling in that area. She's had no fevers or chills.  From a cardiovascular standpoint she denies any new shortness of breath, PND or orthopnea. She denies any chest pressure, neck or arm discomfort. She's had no palpitations, presyncope or syncope.  Current Medications (verified): 1)  Levothroid 88 Mcg Tabs (Levothyroxine Sodium) .... Take One Tablet By Mouth Once Daily. 2)  Warfarin Sodium 10 Mg Tabs (Warfarin Sodium) .... Use As Directed By Anticoagulation Clinic 3)  Hydralazine Hcl 25 Mg Tabs (Hydralazine Hcl) .... Take One Tablet By Mouth Three Times A Day 4)  Amlodipine Besylate 10 Mg Tabs (Amlodipine Besylate) .... Take One Tablet By Mouth Daily 5)  Renvela 800 Mg Tabs (Sevelamer Carbonate) .... 2 With Every Meal or Snack. 6)  Pravastatin Sodium 40 Mg Tabs  (Pravastatin Sodium) .... Take 2  Tablets By Mouth Daily At Bedtime 7)  Citalopram Hydrobromide 20 Mg Tabs (Citalopram Hydrobromide) .... Take One Tablet By Mouth Once Daily. 8)  Hydrocodone-Acetaminophen 5-500 Mg Tabs (Hydrocodone-Acetaminophen) .... As Needed 9)  Lorazepam 2 Mg Tabs (Lorazepam) .... As Needed 10)  Rena-Vite  Tabs (B Complex-C-Folic Acid) .[name].. 1 Tablet By Mouth Daily 11)  Aspirin 81 Mg Tbec (Aspirin) .... Take One Tablet By Mouth Daily  Allergies (verified): 1)  ! Pcn 2)  ! Ace Inhibitors 3)  ! Demerol 4)  ! * Ivp Dye 5)  ! Morphine 6)  ! * Tb Med 7)  ! Talwin 8)  ! Dilantin 9)  ! Asa 10)  ! Heparin  Past History:  Past Medical History: CAD (RCA stent) CVA 2003 (no apparent residual) Renal failure related to hypertension Hypothyroidism Peripheral vascular disease (right leg 2 stents left leg 3 stents per the patient) Hyperlipidemia Tobacco abuse Positive PPD completed rifampin Diastolic heart failure  Review of Systems       As stated in the HPI and negative for all other systems.   Vital Signs:  Patient profile:   65 year old female Height:      63 inches Weight:      194 pounds BMI:     34.49 Pulse rate:   62 / minute Resp:     16 per minute BP sitting:   162 / 84  (left arm)  Vitals Entered By: Marrion Coy, CNA (May 11, 2009 9:15 AM)  Physical Exam  General:  Well developed, well nourished, in no acute distress. Head:  normocephalic and  atraumatic Eyes:  PERRLA/EOM intact; conjunctiva and lids normal. Neck:  Neck supple, no JVD. No masses, thyromegaly or abnormal cervical nodes. Chest Wall:  no deformities or breast masses noted Lungs:  Clear bilaterally to auscultation and percussion. Abdomen:  Bowel sounds positive; abdomen soft and non-tender without masses, organomegaly, or hernias noted. No hepatosplenomegaly. Msk:  Back normal, normal gait. Muscle strength and tone normal. Extremities:  right leg lower extremity with swelling,  induration with some erythema Neurologic:  Alert and oriented x 3. Skin:  Intact without lesions or rashes. Psych:  Normal affect.   Detailed Cardiovascular Exam  Neck    Carotids: Carotids full and equal bilaterally without bruits.      Neck Veins: Normal, no JVD.    Heart    Inspection: no deformities or lifts noted.      Palpation: normal PMI with no thrills palpable.      Auscultation: regular rate and rhythm, S1, S2 without murmurs, rubs, gallops, or clicks.    Vascular    Abdominal Aorta: no palpable masses, pulsations, or audible bruits.      Femoral Pulses: normal femoral pulses bilaterally.      Pedal Pulses: normal pedal pulses bilaterally.      Radial Pulses: normal radial pulses bilaterally.      Peripheral Circulation: no clubbing, cyanosis, with normal capillary refill.     EKG  Procedure date:  05/11/2009  Findings:      sinus rhythm, rate 60 to, left axis deviation, poor anterior R-wave progression, no acute ST-T wave changes.  Impression & Recommendations:  Problem # 1:  LEG PAIN (ICD-729.5) Today I will check a lower extremity venous Doppler though I think DVT is unlikely. She does have a therapeutic Coumadin level is documented and there seems to be a superficial skin component to this. However, there is some calf swelling and tenderness as well. If this is negative I will talk with her nephrologist who will be seeing her for dialysis today and asked them to suggest further treatment with consideration of antibiotics. Orders: Venous Duplex Lower Extremity (Venous Duplex Lower)  Problem # 2:  CAD (ICD-414.00) She is having no chest pain. She had a catheter results as described from January. She will continue with risk reduction. Orders: EKG w/ Interpretation (93000)  Problem # 3:  TOBACCO USER (ICD-305.1) I again talked with her about the need to stop smoking.  (Greater than 3 minutes)  Problem # 4:  CHRONIC DIASTOLIC HEART FAILURE (ICD-428.32) The  patient seems to be euvolemic with volume treated at dialysis. She avoids salt.  Problem # 5:  ESSENTIAL HYPERTENSION, MALIGNANT (ICD-401.0) She has had difficult to control hypertension. Her blood pressure is elevated today. However, I defer blood pressure management to her nephrologist to see her frequently.  Patient Instructions: 1)  Your physician recommends that you schedule a follow-up appointment as directed 2)  Your physician recommends that you continue on your current medications as directed. Please refer to the Current Medication list given to you today. 3)  Your physician has requested that you have a lower or upper extremity venous duplex.  This test is an ultrasound of the veins in the legs or arms.  It looks at venous blood flow that carries blood from the heart to the legs or arms.  Allow one hour for a Lower Venous exam.  Allow thirty minutes for an Upper Venous exam. There are no restrictions or special instructions.

## 2010-02-01 NOTE — Letter (Signed)
Summary: [facility] Cardiology Office Note  [facility]   Imported By: Roderic Ovens 04/19/2009 16:12:17  _____________________________________________________________________  External Attachment:    Type:   Image     Comment:   External Document

## 2010-02-01 NOTE — Medication Information (Signed)
Summary: Coumadin Clinic  Anticoagulant Therapy  Managed by: Weston Brass, PharmD Referring MD: Angelina Sheriff, MD PCP: None Supervising MD: Eden Emms MD, Theron Arista Indication 1: CVA Lab Used: [facility] [place] Site: [address] PT 36.1 INR POC 3.58 INR RANGE 2.0-3.0  Dietary changes: yes       Details: diet has been different; no vit k in the last few weeks   Health status changes: yes       Details: has cellulitis in leg  Bleeding/hemorrhagic complications: no    Recent/future hospitalizations: no    Any changes in medication regimen? yes       Details: on vancomycin for cellutitis- given to her at dialysis   Recent/future dental: no  Any missed doses?: no       Is patient compliant with meds? yes      Comments: Labs drawn at dialysis.    Allergies: 1)  ! Pcn 2)  ! Ace Inhibitors 3)  ! Demerol 4)  ! * Ivp Dye 5)  ! Morphine 6)  ! * Tb Med 7)  ! Talwin 8)  ! Dilantin 9)  ! Asa 10)  ! Heparin  Anticoagulation Management History:      Her anticoagulation is being managed by telephone today.  Positive risk factors for bleeding include an age of 109 years or older and history of CVA/TIA.  The bleeding index is 'intermediate risk'.  Positive CHADS2 values include History of CHF, History of HTN, and Prior Stroke/CVA/TIA.  Negative CHADS2 values include Age > 11 years old.  Prothrombin time is 36.1.  Anticoagulation responsible provider: Eden Emms MD, Theron Arista.  INR POC: 3.58.  Exp: 05/2010.    Anticoagulation Management Assessment/Plan:      The patient's current anticoagulation dose is Warfarin sodium 10 mg tabs: Use as directed by Anticoagulation Clinic.  The target INR is 2.0-3.0.  The next INR is due 05/26/2009.  Anticoagulation instructions were given to patient.  Results were reviewed/authorized by Weston Brass, PharmD.  She was notified by Weston Brass PharmD.         Prior Anticoagulation Instructions: INR 2.0. Take 1 tablet daily. Recheck in 4 weeks.  Current  Anticoagulation Instructions: INR 3.58   LMOM for pt Weston Brass PharmD  May 18, 2009 11:26 AM  [facility] Weston Brass PharmD  May 19, 2009 10:17 AM   Spoke with pt.  Take 1/2 tablet today then resume same dose of 1 tablet every day.  Recheck INR in 1 week Weston Brass PharmD  May 19, 2009 4:10 PM

## 2010-02-01 NOTE — Assessment & Plan Note (Signed)
Summary: 6 month rov 428.22  414.01  pfh,rn   Visit Type:  Follow-up Primary Provider:  Dorothyann Peng, MD  CC:  CAD/PVD.  History of Present Illness: The patient presents for followup of her known coronary and peripheral vascular disease. I last saw her she has had a left nephrectomy to treat clear cell cancer. She did well with this. She had no cardiovascular problems. Since then she has had no further chest discomfort, neck or arm discomfort. She does get short of breath with activities but she is relatively sedentary. She is not describing any resting shortness of breath, PND or orthopnea. She is having no new palpitations, presyncope or syncope. She has had no weight gain or edema.  Current Medications (verified): 1)  Levothroid 88 Mcg Tabs (Levothyroxine Sodium) .... Take One Tablet By Mouth Once Daily. 2)  Warfarin Sodium 10 Mg Tabs (Warfarin Sodium) .... Use As Directed By Anticoagulation Clinic 3)  Amlodipine Besylate 10 Mg Tabs (Amlodipine Besylate) .... Take One Tablet By Mouth Daily 4)  Renvela 800 Mg Tabs (Sevelamer Carbonate) .... 2 With Every Meal or Snack. 5)  Pravastatin Sodium 40 Mg Tabs (Pravastatin Sodium) .... Take 2  Tablets By Mouth Daily At Bedtime 6)  Citalopram Hydrobromide 20 Mg Tabs (Citalopram Hydrobromide) .... Take One Tablet By Mouth Once Daily. 7)  Hydrocodone-Acetaminophen 5-500 Mg Tabs (Hydrocodone-Acetaminophen) .... As Needed 8)  Lorazepam 2 Mg Tabs (Lorazepam) .... As Needed 9)  Rena-Vite  Tabs (B Complex-C-Folic Acid) .[name].. 1 Tablet By Mouth Daily  Allergies (verified): 1)  ! Pcn 2)  ! Ace Inhibitors 3)  ! Demerol 4)  ! * Ivp Dye 5)  ! Morphine 6)  ! * Tb Med 7)  ! Talwin 8)  ! Dilantin 9)  ! Asa 10)  ! Heparin  Past History:  Past Medical History: CAD (RCA stent) CVA 2003 (no apparent residual) Renal failure related to hypertension Hypothyroidism Peripheral vascular disease (right leg 2 stents left leg 3 stents per the  patient) Hyperlipidemia Tobacco abuse Positive PPD completed rifampin Diastolic heart failure Clear cell cancer  Past Surgical History: Hysterectomy Cholecystectomy D&Cs Right Ankle Repair Left nephrectomy  Review of Systems       As stated in the HPI and negative for all other systems.   Vital Signs:  Patient profile:   65 year old female Height:      63 inches Weight:      168 pounds BMI:     29.87 Pulse rate:   65 / minute Resp:     16 per minute BP sitting:   168 / 78  (right arm)  Vitals Entered By: Marrion Coy, CNA (November 16, 2009 10:42 AM)  Physical Exam  General:  Well developed, well nourished, in no acute distress. Head:  normocephalic and atraumatic Eyes:  PERRLA/EOM intact; conjunctiva and lids normal. Neck:  Neck supple, no JVD. No masses, thyromegaly or abnormal cervical nodes, transmitted bruit from dialysis graft Chest Wall:  no deformities or breast masses noted Lungs:  Clear bilaterally to auscultation and percussion. Abdomen:  Bowel sounds positive; abdomen soft and non-tender without masses, organomegaly, or hernias noted. No hepatosplenomegaly. Msk:  Back normal, normal gait. Muscle strength and tone normal. Extremities:  Right upper arm AV dialysis graft Neurologic:  Alert and oriented x 3. Skin:  Intact without lesions or rashes. Cervical Nodes:  no significant adenopathy Inguinal Nodes:  no significant adenopathy Psych:  Normal affect.   Detailed Cardiovascular Exam  Neck  Carotids: Carotids full and equal bilaterally without bruits.      Neck Veins: Normal, no JVD.    Heart    Inspection: no deformities or lifts noted.      Palpation: normal PMI with no thrills palpable.      Auscultation: regular rate and rhythm, S1, S2 with soft sternal border murmur, no  rubs, gallops, or clicks.    Vascular    Abdominal Aorta: no palpable masses, pulsations, or audible bruits.      Femoral Pulses: normal femoral pulses bilaterally.       Pedal Pulses: normal pedal pulses bilaterally.      Radial Pulses: absent right radial pulse and diminished left radial pulse.      Peripheral Circulation: no clubbing, cyanosis, with normal capillary refill.     EKG  Procedure date:  11/14/2009  Findings:      Sinus rhythm, rate 78, axis within normal limits, intervals within normal limits, no acute ST-T wave changes  Impression & Recommendations:  Problem # 1:  CAD (ICD-414.00) Her last catheterization was January. She has had no new symptoms. She should continue with risk reduction.  She is to be on aspirin. She will resume this.  Problem # 2:  ESSENTIAL HYPERTENSION, MALIGNANT (ICD-401.0) Her blood pressure is elevated. However, I will do for management of this to her nephrologist since her blood pressure is labile related to dialysis.  Problem # 3:  COUMADIN THERAPY (ICD-V58.61) I have reviewed her records thoroughly. At this point I see no clear indication to continue Coumadin. It was started in the past after stent placement as she couldn't take Plavix. She has seen a hematologist recently in the hospital who said they found no clear indication to continue this. I think the risk benefit far weighs on the side of discontinuing it. Therefore, she will stop Coumadin. I had a long discussion with her daughter and her about this.  Problem # 4:  TOBACCO USER (ICD-305.1) We had a long discussion about tobacco cessation (greater than 3 minutes). She will try patches worked him in the electronic cigarette. She has failed Zyban and Chantix  Problem # 5:  OTHER AND UNSPECIFIED HYPERLIPIDEMIA (ICD-272.4) She is given written instructions to get a lipid profile fasting.  Patient Instructions: 1)  Your physician recommends that you schedule a follow-up appointment as directed 2)  Your physician has recommended you make the following change in your medication: stop Coumadin 3)  Your physician recommends that you have a FASTING lipid and liver  profile: 272.0 v58.69

## 2010-02-01 NOTE — Medication Information (Signed)
Summary: rov/tm  Anticoagulant Therapy  Managed by: Cloyde Reams, RN, BSN Referring MD: Angelina Sheriff, MD PCP: Dr. Rolly Pancake [facility]) Supervising MD: Shirlee Latch MD, Freida Busman Indication 1: CVA Lab Used: [facility] [place] Site: [address] INR POC 1.7 INR RANGE 2.0-3.0  Dietary changes: no    Health status changes: no    Bleeding/hemorrhagic complications: no    Recent/future hospitalizations: no    Any changes in medication regimen? no    Recent/future dental: no  Any missed doses?: yes     Details: Fell asleep, missed 1 dose last week, took 15mg  the next day.    Is patient compliant with meds? yes      Comments: Pt states her vein MD advised her she may not need coumadin currently, pt will discuss with Dr Antoine Poche.    Allergies (verified): 1)  ! Pcn 2)  ! Ace Inhibitors 3)  ! Demerol 4)  ! * Ivp Dye 5)  ! Morphine 6)  ! * Tb Med 7)  ! Talwin 8)  ! Dilantin 9)  ! Asa 10)  ! Heparin  Anticoagulation Management History:      The patient is taking warfarin and comes in today for a routine follow up visit.  Positive risk factors for bleeding include history of CVA/TIA.  Negative risk factors for bleeding include an age less than 49 years old.  The bleeding index is 'intermediate risk'.  Positive CHADS2 values include Prior Stroke/CVA/TIA.  Negative CHADS2 values include Age > 71 years old.  Anticoagulation responsible provider: Shirlee Latch MD, Ameilia Rattan.  INR POC: 1.7.  Cuvette Lot#: [id number].  Exp: 05/2010.    Anticoagulation Management Assessment/Plan:      The patient's current anticoagulation dose is Warfarin sodium 10 mg tabs: Use as directed by Anticoagulation Clinic.  The target INR is 2.0-3.0.  The next INR is due 04/20/2009.  Anticoagulation instructions were given to patient.  Results were reviewed/authorized by Cloyde Reams, RN, BSN.  She was notified by Cloyde Reams RN.         Prior Anticoagulation Instructions: INR 3.0 Continue 10mg   everyday. Recheck in 3 weeks.   Current Anticoagulation Instructions: INR 1.7  Take 15mg  today, then resume same dosage 10mg  daily.  Recheck in 3 weeks.

## 2010-03-15 LAB — DIFFERENTIAL
Basophils Relative: 1 % (ref 0–1)
Eosinophils Absolute: 0.4 10*3/uL (ref 0.0–0.7)
Eosinophils Relative: 5 % (ref 0–5)
Lymphs Abs: 1.6 10*3/uL (ref 0.7–4.0)
Monocytes Relative: 7 % (ref 3–12)

## 2010-03-15 LAB — CBC
Hemoglobin: 10.5 g/dL — ABNORMAL LOW (ref 12.0–15.0)
MCH: 26.3 pg (ref 26.0–34.0)
MCHC: 31.8 g/dL (ref 30.0–36.0)
MCV: 82.5 fL (ref 78.0–100.0)
RBC: 4 MIL/uL (ref 3.87–5.11)

## 2010-03-15 LAB — POCT I-STAT, CHEM 8
BUN: 19 mg/dL (ref 6–23)
Creatinine, Ser: 7.3 mg/dL — ABNORMAL HIGH (ref 0.4–1.2)
Sodium: 138 mEq/L (ref 135–145)
TCO2: 33 mmol/L (ref 0–100)

## 2010-03-17 LAB — COMPREHENSIVE METABOLIC PANEL
ALT: 18 U/L (ref 0–35)
AST: 29 U/L (ref 0–37)
Albumin: 2.9 g/dL — ABNORMAL LOW (ref 3.5–5.2)
Alkaline Phosphatase: 46 U/L (ref 39–117)
BUN: 36 mg/dL — ABNORMAL HIGH (ref 6–23)
CO2: 28 mEq/L (ref 19–32)
Calcium: 8.9 mg/dL (ref 8.4–10.5)
Chloride: 98 mEq/L (ref 96–112)
Creatinine, Ser: 8.78 mg/dL — ABNORMAL HIGH (ref 0.4–1.2)
GFR calc Af Amer: 5 mL/min — ABNORMAL LOW (ref >60)
GFR calc non Af Amer: 5 mL/min — ABNORMAL LOW (ref >60)
Glucose, Bld: 88 mg/dL (ref 70–99)
Potassium: 4 mEq/L (ref 3.5–5.1)
Sodium: 136 mEq/L (ref 135–145)
Total Bilirubin: 0.8 mg/dL (ref 0.3–1.2)
Total Protein: 6.3 g/dL (ref 6.0–8.3)

## 2010-03-17 LAB — BASIC METABOLIC PANEL
BUN: 19 mg/dL (ref 6–23)
CO2: 27 mEq/L (ref 19–32)
Calcium: 8.8 mg/dL (ref 8.4–10.5)
Chloride: 99 mEq/L (ref 96–112)
Creatinine, Ser: 6.38 mg/dL — ABNORMAL HIGH (ref 0.4–1.2)
GFR calc Af Amer: 8 mL/min — ABNORMAL LOW (ref >60)
GFR calc non Af Amer: 7 mL/min — ABNORMAL LOW (ref >60)
Glucose, Bld: 82 mg/dL (ref 70–99)
Potassium: 4.1 mEq/L (ref 3.5–5.1)
Sodium: 137 mEq/L (ref 135–145)

## 2010-03-17 LAB — CBC
HCT: 29.1 % — ABNORMAL LOW (ref 36.0–46.0)
HCT: 29.8 % — ABNORMAL LOW (ref 36.0–46.0)
Hemoglobin: 9.8 g/dL — ABNORMAL LOW (ref 12.0–15.0)
Hemoglobin: 9.9 g/dL — ABNORMAL LOW (ref 12.0–15.0)
MCH: 27.6 pg (ref 26.0–34.0)
MCH: 27.8 pg (ref 26.0–34.0)
MCHC: 33.3 g/dL (ref 30.0–36.0)
MCHC: 33.5 g/dL (ref 30.0–36.0)
MCV: 82.8 fL (ref 78.0–100.0)
MCV: 82.9 fL (ref 78.0–100.0)
Platelets: 127 10*3/uL — ABNORMAL LOW (ref 150–400)
Platelets: 148 10*3/uL — ABNORMAL LOW (ref 150–400)
RBC: 3.51 MIL/uL — ABNORMAL LOW (ref 3.87–5.11)
RBC: 3.6 MIL/uL — ABNORMAL LOW (ref 3.87–5.11)
RDW: 16.7 % — ABNORMAL HIGH (ref 11.5–15.5)
RDW: 17.2 % — ABNORMAL HIGH (ref 11.5–15.5)
WBC: 5.9 10*3/uL (ref 4.0–10.5)
WBC: 6.8 10*3/uL (ref 4.0–10.5)

## 2010-03-17 LAB — PROTIME-INR: INR: 2.66 — ABNORMAL HIGH (ref 0.00–1.49)

## 2010-03-17 LAB — PHOSPHORUS: Phosphorus: 6 mg/dL — ABNORMAL HIGH (ref 2.3–4.6)

## 2010-03-18 LAB — BASIC METABOLIC PANEL WITH GFR
BUN: 36 mg/dL — ABNORMAL HIGH (ref 6–23)
CO2: 24 meq/L (ref 19–32)
Calcium: 8.2 mg/dL — ABNORMAL LOW (ref 8.4–10.5)
Chloride: 106 meq/L (ref 96–112)
Creatinine, Ser: 8.29 mg/dL — ABNORMAL HIGH (ref 0.4–1.2)
GFR calc non Af Amer: 5 mL/min — ABNORMAL LOW
Glucose, Bld: 100 mg/dL — ABNORMAL HIGH (ref 70–99)
Potassium: 5.4 meq/L — ABNORMAL HIGH (ref 3.5–5.1)
Sodium: 139 meq/L (ref 135–145)

## 2010-03-18 LAB — CBC
HCT: 28.1 % — ABNORMAL LOW (ref 36.0–46.0)
HCT: 30.2 % — ABNORMAL LOW (ref 36.0–46.0)
HCT: 36.1 % (ref 36.0–46.0)
Hemoglobin: 10 g/dL — ABNORMAL LOW (ref 12.0–15.0)
Hemoglobin: 9.2 g/dL — ABNORMAL LOW (ref 12.0–15.0)
Hemoglobin: 11.9 g/dL — ABNORMAL LOW (ref 12.0–15.0)
MCH: 27.4 pg (ref 26.0–34.0)
MCH: 27.5 pg (ref 26.0–34.0)
MCH: 27.2 pg (ref 26.0–34.0)
MCHC: 32.8 g/dL (ref 30.0–36.0)
MCHC: 33.1 g/dL (ref 30.0–36.0)
MCHC: 33 g/dL (ref 30.0–36.0)
MCV: 83.1 fL (ref 78.0–100.0)
MCV: 83.6 fL (ref 78.0–100.0)
MCV: 82.6 fL (ref 78.0–100.0)
Platelets: 133 K/uL — ABNORMAL LOW (ref 150–400)
Platelets: 157 10*3/uL (ref 150–400)
Platelets: 192 K/uL (ref 150–400)
RBC: 3.37 MIL/uL — ABNORMAL LOW (ref 3.87–5.11)
RBC: 3.63 MIL/uL — ABNORMAL LOW (ref 3.87–5.11)
RBC: 4.37 MIL/uL (ref 3.87–5.11)
RDW: 17.1 % — ABNORMAL HIGH (ref 11.5–15.5)
RDW: 17.7 % — ABNORMAL HIGH (ref 11.5–15.5)
RDW: 15.1 % (ref 11.5–15.5)
WBC: 5 10*3/uL (ref 4.0–10.5)
WBC: 6.7 K/uL (ref 4.0–10.5)
WBC: 9.2 K/uL (ref 4.0–10.5)

## 2010-03-18 LAB — COMPREHENSIVE METABOLIC PANEL
ALT: 11 U/L (ref 0–35)
AST: 17 U/L (ref 0–37)
AST: 23 U/L (ref 0–37)
Albumin: 3.3 g/dL — ABNORMAL LOW (ref 3.5–5.2)
Albumin: 3.5 g/dL (ref 3.5–5.2)
Alkaline Phosphatase: 46 U/L (ref 39–117)
BUN: 26 mg/dL — ABNORMAL HIGH (ref 6–23)
CO2: 29 mEq/L (ref 19–32)
CO2: 24 mEq/L (ref 19–32)
Calcium: 8.8 mg/dL (ref 8.4–10.5)
Calcium: 9.8 mg/dL (ref 8.4–10.5)
Chloride: 104 mEq/L (ref 96–112)
Creatinine, Ser: 6.46 mg/dL — ABNORMAL HIGH (ref 0.4–1.2)
Creatinine, Ser: 7.9 mg/dL — ABNORMAL HIGH (ref 0.4–1.2)
GFR calc Af Amer: 8 mL/min — ABNORMAL LOW (ref >60)
GFR calc Af Amer: 6 mL/min — ABNORMAL LOW (ref >60)
GFR calc non Af Amer: 6 mL/min — ABNORMAL LOW (ref >60)
GFR calc non Af Amer: 5 mL/min — ABNORMAL LOW (ref >60)
Glucose, Bld: 104 mg/dL — ABNORMAL HIGH (ref 70–99)
Potassium: 4.1 mEq/L (ref 3.5–5.1)
Sodium: 140 mEq/L (ref 135–145)
Total Bilirubin: 0.8 mg/dL (ref 0.3–1.2)
Total Protein: 6.8 g/dL (ref 6.0–8.3)

## 2010-03-18 LAB — IRON AND TIBC
Iron: 56 ug/dL (ref 42–135)
Saturation Ratios: 21 % (ref 20–55)
TIBC: 266 ug/dL (ref 250–470)
UIBC: 210 ug/dL

## 2010-03-18 LAB — ABO/RH: ABO/RH(D): B POS

## 2010-03-18 LAB — PROTIME-INR
INR: 1.06 (ref 0.00–1.49)
Prothrombin Time: 14 seconds (ref 11.6–15.2)
Prothrombin Time: 29.3 seconds — ABNORMAL HIGH (ref 11.6–15.2)

## 2010-03-18 LAB — DIFFERENTIAL
Basophils Absolute: 0 K/uL (ref 0.0–0.1)
Basophils Absolute: 0 10*3/uL (ref 0.0–0.1)
Basophils Relative: 0 % (ref 0–1)
Basophils Relative: 0 % (ref 0–1)
Eosinophils Absolute: 0.1 K/uL (ref 0.0–0.7)
Eosinophils Absolute: 0.2 10*3/uL (ref 0.0–0.7)
Eosinophils Relative: 1 % (ref 0–5)
Lymphocytes Relative: 17 % (ref 12–46)
Lymphocytes Relative: 25 % (ref 12–46)
Lymphs Abs: 1.1 K/uL (ref 0.7–4.0)
Monocytes Absolute: 0.7 K/uL (ref 0.1–1.0)
Monocytes Absolute: 0.9 10*3/uL (ref 0.1–1.0)
Monocytes Relative: 10 % (ref 3–12)
Monocytes Relative: 10 % (ref 3–12)
Neutro Abs: 4.8 K/uL (ref 1.7–7.7)
Neutrophils Relative %: 72 % (ref 43–77)

## 2010-03-18 LAB — SURGICAL PCR SCREEN
MRSA, PCR: NEGATIVE
Staphylococcus aureus: NEGATIVE

## 2010-03-18 LAB — TYPE AND SCREEN
ABO/RH(D): B POS
Antibody Screen: NEGATIVE

## 2010-03-18 LAB — FERRITIN: Ferritin: 1189 ng/mL — ABNORMAL HIGH (ref 10–291)

## 2010-03-18 LAB — URINALYSIS, ROUTINE W REFLEX MICROSCOPIC
Bilirubin Urine: NEGATIVE
Glucose, UA: 100 mg/dL — AB
Ketones, ur: NEGATIVE mg/dL
Leukocytes, UA: NEGATIVE
Nitrite: NEGATIVE
Protein, ur: 100 mg/dL — AB
Specific Gravity, Urine: 1.008 (ref 1.005–1.030)
Urobilinogen, UA: 0.2 mg/dL (ref 0.0–1.0)
pH: 8.5 — ABNORMAL HIGH (ref 5.0–8.0)

## 2010-03-18 LAB — GLUCOSE, CAPILLARY
Glucose-Capillary: 102 mg/dL — ABNORMAL HIGH (ref 70–99)
Glucose-Capillary: 127 mg/dL — ABNORMAL HIGH (ref 70–99)
Glucose-Capillary: 79 mg/dL (ref 70–99)

## 2010-03-18 LAB — URINE MICROSCOPIC-ADD ON

## 2010-03-18 LAB — APTT: aPTT: 33 seconds (ref 24–37)

## 2010-03-21 LAB — POCT I-STAT 3, ART BLOOD GAS (G3+)
Acid-base deficit: 6 mmol/L — ABNORMAL HIGH (ref 0.0–2.0)
O2 Saturation: 76 %
TCO2: 24 mmol/L (ref 0–100)
pCO2 arterial: 56.3 mmHg — ABNORMAL HIGH (ref 35.0–45.0)

## 2010-03-21 LAB — IRON AND TIBC
Saturation Ratios: 17 % — ABNORMAL LOW (ref 20–55)
TIBC: 254 ug/dL (ref 250–470)

## 2010-03-21 LAB — RENAL FUNCTION PANEL
Albumin: 3 g/dL — ABNORMAL LOW (ref 3.5–5.2)
BUN: 65 mg/dL — ABNORMAL HIGH (ref 6–23)
Chloride: 97 mEq/L (ref 96–112)
Creatinine, Ser: 9 mg/dL — ABNORMAL HIGH (ref 0.4–1.2)

## 2010-03-21 LAB — CBC
HCT: 34.7 % — ABNORMAL LOW (ref 36.0–46.0)
HCT: 38.8 % (ref 36.0–46.0)
Hemoglobin: 10.5 g/dL — ABNORMAL LOW (ref 12.0–15.0)
Hemoglobin: 9.9 g/dL — ABNORMAL LOW (ref 12.0–15.0)
MCHC: 33.3 g/dL (ref 30.0–36.0)
MCHC: 33.9 g/dL (ref 30.0–36.0)
MCV: 85.8 fL (ref 78.0–100.0)
Platelets: 130 10*3/uL — ABNORMAL LOW (ref 150–400)
Platelets: 143 10*3/uL — ABNORMAL LOW (ref 150–400)
RBC: 3.47 MIL/uL — ABNORMAL LOW (ref 3.87–5.11)
RBC: 3.73 MIL/uL — ABNORMAL LOW (ref 3.87–5.11)
RBC: 4.04 MIL/uL (ref 3.87–5.11)
RDW: 14.6 % (ref 11.5–15.5)
RDW: 14.9 % (ref 11.5–15.5)
WBC: 16.2 10*3/uL — ABNORMAL HIGH (ref 4.0–10.5)
WBC: 5.6 10*3/uL (ref 4.0–10.5)

## 2010-03-21 LAB — PROTIME-INR
INR: 1.42 (ref 0.00–1.49)
INR: 1.49 (ref 0.00–1.49)
INR: 1.5 — ABNORMAL HIGH (ref 0.00–1.49)
INR: 1.53 — ABNORMAL HIGH (ref 0.00–1.49)
INR: 2.8 — ABNORMAL HIGH (ref 0.00–1.49)
Prothrombin Time: 17.9 seconds — ABNORMAL HIGH (ref 11.6–15.2)
Prothrombin Time: 18.3 seconds — ABNORMAL HIGH (ref 11.6–15.2)

## 2010-03-21 LAB — POCT CARDIAC MARKERS: Troponin i, poc: <0.05 ng/mL (ref 0.00–0.09)

## 2010-03-21 LAB — URINALYSIS, ROUTINE W REFLEX MICROSCOPIC
Bilirubin Urine: NEGATIVE
Glucose, UA: 100 mg/dL — AB
Ketones, ur: NEGATIVE mg/dL
pH: 7.5 (ref 5.0–8.0)

## 2010-03-21 LAB — CARDIAC PANEL(CRET KIN+CKTOT+MB+TROPI)
CK, MB: 3.3 ng/mL (ref 0.3–4.0)
CK, MB: 3.4 ng/mL (ref 0.3–4.0)
CK, MB: 4 ng/mL (ref 0.3–4.0)
Total CK: 97 U/L (ref 7–177)
Total CK: 98 U/L (ref 7–177)
Troponin I: 0.75 ng/mL (ref 0.00–0.06)

## 2010-03-21 LAB — BASIC METABOLIC PANEL
BUN: 50 mg/dL — ABNORMAL HIGH (ref 6–23)
CO2: 24 mEq/L (ref 19–32)
CO2: 25 mEq/L (ref 19–32)
CO2: 27 mEq/L (ref 19–32)
Calcium: 8.2 mg/dL — ABNORMAL LOW (ref 8.4–10.5)
Calcium: 8.3 mg/dL — ABNORMAL LOW (ref 8.4–10.5)
Creatinine, Ser: 4.96 mg/dL — ABNORMAL HIGH (ref 0.4–1.2)
Creatinine, Ser: 7.54 mg/dL — ABNORMAL HIGH (ref 0.4–1.2)
GFR calc Af Amer: 11 mL/min — ABNORMAL LOW (ref >60)
GFR calc Af Amer: 7 mL/min — ABNORMAL LOW (ref >60)
GFR calc non Af Amer: 9 mL/min — ABNORMAL LOW (ref >60)
Glucose, Bld: 102 mg/dL — ABNORMAL HIGH (ref 70–99)
Glucose, Bld: 115 mg/dL — ABNORMAL HIGH (ref 70–99)
Potassium: 4.2 mEq/L (ref 3.5–5.1)
Potassium: 4.3 mEq/L (ref 3.5–5.1)
Sodium: 136 mEq/L (ref 135–145)
Sodium: 139 mEq/L (ref 135–145)

## 2010-03-21 LAB — DIFFERENTIAL
Basophils Relative: 0 % (ref 0–1)
Eosinophils Absolute: 0 10*3/uL (ref 0.0–0.7)
Eosinophils Relative: 0 % (ref 0–5)
Lymphs Abs: 1.6 10*3/uL (ref 0.7–4.0)
Monocytes Relative: 5 % (ref 3–12)

## 2010-03-21 LAB — URINE MICROSCOPIC-ADD ON

## 2010-03-21 LAB — HEPATITIS B SURFACE ANTIGEN: Hepatitis B Surface Ag: NEGATIVE

## 2010-03-21 LAB — POCT I-STAT, CHEM 8
Calcium, Ion: 1.08 mmol/L — ABNORMAL LOW (ref 1.12–1.32)
Chloride: 116 mEq/L — ABNORMAL HIGH (ref 96–112)
Glucose, Bld: 174 mg/dL — ABNORMAL HIGH (ref 70–99)
HCT: 36 % (ref 36.0–46.0)

## 2010-03-21 LAB — FERRITIN: Ferritin: 1331 ng/mL — ABNORMAL HIGH (ref 10–291)

## 2010-03-21 LAB — URINE CULTURE: Colony Count: NO GROWTH

## 2010-03-21 LAB — PHOSPHORUS: Phosphorus: 1.9 mg/dL — ABNORMAL LOW (ref 2.3–4.6)

## 2010-03-24 LAB — BASIC METABOLIC PANEL
CO2: 28 mEq/L (ref 19–32)
Chloride: 97 mEq/L (ref 96–112)
Creatinine, Ser: 5.83 mg/dL — ABNORMAL HIGH (ref 0.4–1.2)
GFR calc Af Amer: 9 mL/min — ABNORMAL LOW (ref >60)

## 2010-03-24 LAB — CBC
HCT: 37.7 % (ref 36.0–46.0)
Hemoglobin: 12.4 g/dL (ref 12.0–15.0)
MCHC: 32.9 g/dL (ref 30.0–36.0)
MCV: 86.1 fL (ref 78.0–100.0)
RBC: 4.37 MIL/uL (ref 3.87–5.11)

## 2010-03-24 LAB — PROTIME-INR: Prothrombin Time: 18.7 seconds — ABNORMAL HIGH (ref 11.6–15.2)

## 2010-07-21 ENCOUNTER — Encounter: Payer: Self-pay | Admitting: Cardiology

## 2010-08-23 ENCOUNTER — Telehealth: Payer: Self-pay | Admitting: Cardiology

## 2010-08-23 ENCOUNTER — Emergency Department (HOSPITAL_COMMUNITY)
Admission: EM | Admit: 2010-08-23 | Discharge: 2010-08-23 | Disposition: A | Discharge status: Home / Self Care | Payer: Medicare Other | Attending: Emergency Medicine | Admitting: Emergency Medicine

## 2010-08-23 ENCOUNTER — Encounter (HOSPITAL_COMMUNITY): Payer: Self-pay

## 2010-08-23 ENCOUNTER — Emergency Department (HOSPITAL_COMMUNITY): Payer: Medicare Other

## 2010-08-23 ENCOUNTER — Ambulatory Visit: Payer: Self-pay | Admitting: Cardiology

## 2010-08-23 DIAGNOSIS — J449 Chronic obstructive pulmonary disease, unspecified: Secondary | ICD-10-CM | POA: Insufficient documentation

## 2010-08-23 DIAGNOSIS — N289 Disorder of kidney and ureter, unspecified: Secondary | ICD-10-CM | POA: Insufficient documentation

## 2010-08-23 DIAGNOSIS — I2510 Atherosclerotic heart disease of native coronary artery without angina pectoris: Secondary | ICD-10-CM | POA: Insufficient documentation

## 2010-08-23 DIAGNOSIS — N186 End stage renal disease: Secondary | ICD-10-CM | POA: Insufficient documentation

## 2010-08-23 DIAGNOSIS — E119 Type 2 diabetes mellitus without complications: Secondary | ICD-10-CM | POA: Insufficient documentation

## 2010-08-23 DIAGNOSIS — K2980 Duodenitis without bleeding: Secondary | ICD-10-CM | POA: Insufficient documentation

## 2010-08-23 DIAGNOSIS — R197 Diarrhea, unspecified: Secondary | ICD-10-CM | POA: Insufficient documentation

## 2010-08-23 DIAGNOSIS — R109 Unspecified abdominal pain: Secondary | ICD-10-CM | POA: Insufficient documentation

## 2010-08-23 DIAGNOSIS — I120 Hypertensive chronic kidney disease with stage 5 chronic kidney disease or end stage renal disease: Secondary | ICD-10-CM | POA: Insufficient documentation

## 2010-08-23 DIAGNOSIS — J4489 Other specified chronic obstructive pulmonary disease: Secondary | ICD-10-CM | POA: Insufficient documentation

## 2010-08-23 DIAGNOSIS — R112 Nausea with vomiting, unspecified: Secondary | ICD-10-CM | POA: Insufficient documentation

## 2010-08-23 DIAGNOSIS — I509 Heart failure, unspecified: Secondary | ICD-10-CM | POA: Insufficient documentation

## 2010-08-23 HISTORY — DX: Essential (primary) hypertension: I10

## 2010-08-23 LAB — COMPREHENSIVE METABOLIC PANEL
Alkaline Phosphatase: 65 U/L (ref 39–117)
BUN: 35 mg/dL — ABNORMAL HIGH (ref 6–23)
GFR calc Af Amer: 5 mL/min — ABNORMAL LOW (ref >60)
Glucose, Bld: 118 mg/dL — ABNORMAL HIGH (ref 70–99)
Potassium: 5.2 mEq/L — ABNORMAL HIGH (ref 3.5–5.1)
Total Protein: 7.3 g/dL (ref 6.0–8.3)

## 2010-08-23 LAB — CBC
HCT: 36.6 % (ref 36.0–46.0)
MCH: 26.8 pg (ref 26.0–34.0)
MCHC: 33.1 g/dL (ref 30.0–36.0)
MCV: 81.2 fL (ref 78.0–100.0)
RDW: 14.5 % (ref 11.5–15.5)

## 2010-08-23 LAB — DIFFERENTIAL
Eosinophils Relative: 1 % (ref 0–5)
Lymphocytes Relative: 20 % (ref 12–46)
Lymphs Abs: 1.4 10*3/uL (ref 0.7–4.0)
Monocytes Absolute: 0.5 10*3/uL (ref 0.1–1.0)
Monocytes Relative: 7 % (ref 3–12)

## 2010-08-23 NOTE — Telephone Encounter (Signed)
pts dtr calling re appt this am at 1045a, pt bp really high but she has an upset stomach, dtr said on call dr told her to call to see what to do, dtr wants to know if she needs to go to the ER

## 2010-08-23 NOTE — Telephone Encounter (Signed)
Patient states she has been sick since yesterday with diarrhea upset stomach. This AM her B/P has been 220/140 to 183/138. Patient denies blurred vision nor headache. Patient states is going to [facility]. Pt. Has a appointment at 10:45 AM today in Dr. Jenene Slicker clinic. Avie Arenas RN notified. Daun Peacock aware of pt coming to the ER.

## 2010-09-21 ENCOUNTER — Encounter: Payer: Self-pay | Admitting: Cardiology

## 2010-09-22 ENCOUNTER — Ambulatory Visit (HOSPITAL_COMMUNITY)
Admission: RE | Admit: 2010-09-22 | Discharge: 2010-09-22 | Disposition: A | Discharge status: Home / Self Care | Payer: Medicare Other | Source: Ambulatory Visit | Attending: Urology | Admitting: Urology

## 2010-09-22 ENCOUNTER — Other Ambulatory Visit (HOSPITAL_COMMUNITY): Payer: Self-pay | Admitting: Urology

## 2010-09-22 DIAGNOSIS — C801 Malignant (primary) neoplasm, unspecified: Secondary | ICD-10-CM

## 2010-09-22 DIAGNOSIS — Z85528 Personal history of other malignant neoplasm of kidney: Secondary | ICD-10-CM | POA: Insufficient documentation

## 2010-09-22 DIAGNOSIS — I517 Cardiomegaly: Secondary | ICD-10-CM | POA: Insufficient documentation

## 2010-11-03 HISTORY — PX: VASCULAR SURGERY: SHX849

## 2010-11-10 ENCOUNTER — Ambulatory Visit: Payer: Medicare Other | Admitting: Vascular Surgery

## 2010-11-16 ENCOUNTER — Ambulatory Visit: Payer: Medicare Other | Admitting: Vascular Surgery

## 2010-11-28 ENCOUNTER — Ambulatory Visit: Payer: Medicare Other | Admitting: Vascular Surgery

## 2010-11-29 ENCOUNTER — Ambulatory Visit (INDEPENDENT_AMBULATORY_CARE_PROVIDER_SITE_OTHER): Payer: Medicare Other | Admitting: Cardiology

## 2010-11-29 ENCOUNTER — Encounter: Payer: Self-pay | Admitting: Cardiology

## 2010-11-29 DIAGNOSIS — F17200 Nicotine dependence, unspecified, uncomplicated: Secondary | ICD-10-CM

## 2010-11-29 DIAGNOSIS — E7800 Pure hypercholesterolemia, unspecified: Secondary | ICD-10-CM

## 2010-11-29 DIAGNOSIS — I10 Essential (primary) hypertension: Secondary | ICD-10-CM

## 2010-11-29 DIAGNOSIS — E785 Hyperlipidemia, unspecified: Secondary | ICD-10-CM

## 2010-11-29 DIAGNOSIS — E039 Hypothyroidism, unspecified: Secondary | ICD-10-CM

## 2010-11-29 DIAGNOSIS — I2510 Atherosclerotic heart disease of native coronary artery without angina pectoris: Secondary | ICD-10-CM

## 2010-11-29 LAB — LIPID PANEL
Cholesterol: 145 mg/dL (ref 0–200)
VLDL: 25.6 mg/dL (ref 0.0–40.0)

## 2010-11-29 MED ORDER — ASPIRIN EC 81 MG PO TBEC: 81.0000 mg | DELAYED_RELEASE_TABLET | Freq: Every day | ORAL | Status: DC

## 2010-11-29 NOTE — Assessment & Plan Note (Addendum)
She has no new symptoms.  No further cardiovascular testing is indicated.  She will continue with risk reduction.  We did discuss the risk benefit of ASA and she will restart 81 mg.

## 2010-11-29 NOTE — Assessment & Plan Note (Signed)
We talked about this again at length today. She's tried many times to quit smoking and has failed all available therapies.

## 2010-11-29 NOTE — Assessment & Plan Note (Signed)
Although her blood pressure is elevated today it is typically below 140/90 at home. She watches this closely. No change in therapy is indicated.

## 2010-11-29 NOTE — Assessment & Plan Note (Signed)
I will check a lipid profile today with a goal LDL less than 100 and HDL greater than 40.

## 2010-11-29 NOTE — Patient Instructions (Signed)
Please have lab drawn today.  Start Asprin 81 mg a day. Continue all other medications as listed.  Follow up in 1 year with Dr Antoine Poche.  You will receive a letter in the mail 2 months before you are due.  Please call us when you receive this letter to schedule your follow up appointment.

## 2010-11-29 NOTE — Progress Notes (Signed)
HPI Patient presents for followup of her known coronary disease. Her last catheterization was January 2011. Since that time she has had no new cardiovascular problems. She does have end-stage renal disease and is on dialysis. She tolerates this. She has not get chest pressure, neck or arm discomfort. She doesn't report any shortness of breath, PND or orthopnea. She doesn't have any palpitations, presyncope or syncope.  Allergies  Allergen Reactions  . Ace Inhibitors   . Aspirin   . Heparin   . Meperidine Hcl   . Morphine   . Penicillins   . Pentazocine Lactate   . Phenytoin     Current Outpatient Prescriptions  Medication Sig Dispense Refill  . acyclovir (ZOVIRAX) 800 MG tablet Take 800 mg by mouth daily.       [name] amLODipine (NORVASC) 10 MG tablet Take 10 mg by mouth daily.        . carvedilol (COREG) 25 MG tablet Take 25 mg by mouth 2 (two) times daily with a meal.       . DEXILANT 60 MG capsule Take 60 mg by mouth daily.       . hydrALAZINE (APRESOLINE) 25 MG tablet Take 25 mg by mouth 2 (two) times daily.       [name] HYDROcodone-acetaminophen (NORCO) 10-325 MG per tablet Take 1 tablet by mouth every 6 (six) hours as needed.        [name] levothyroxine (SYNTHROID, LEVOTHROID) 88 MCG tablet Take 88 mcg by mouth daily.        [name] LORazepam (ATIVAN) 2 MG tablet Take 2 mg by mouth as needed.        . multivitamin (RENA-VIT) TABS tablet Take 1 tablet by mouth daily.        [name] NITROSTAT 0.4 MG SL tablet       . polyethylene glycol powder (GLYCOLAX/MIRALAX) powder       . pravastatin (PRAVACHOL) 40 MG tablet Take 80 mg by mouth at bedtime.        . sevelamer (RENVELA) 800 MG tablet Take 1,600 mg by mouth 3 (three) times daily with meals.        . citalopram (CELEXA) 20 MG tablet Take 20 mg by mouth daily.        . OXYCONTIN 10 MG 12 hr tablet         Past Medical History  Diagnosis Date  . CAD (coronary artery disease)     stent to RCA  . CVA (cerebral infarction) 2003    no apparent  residual  . Renal failure     related to hypertension  . Hypothyroidism   . PVD (peripheral vascular disease)   . Hyperlipidemia   . Positive PPD     completed rifampin  . Diastolic congestive heart failure   . Cancer     clear cell cancer  . Renal insufficiency   . Hypertension   . COPD (chronic obstructive pulmonary disease) t    Past Surgical History  Procedure Date  . Abdominal hysterectomy   . Cholecystectomy   . D&cs   . Right ankle repair   . Left nephrectomy     ROS:  As stated in the HPI and negative for all other systems.  PHYSICAL EXAM BP 186/93  Pulse 69  Resp 18  Ht 5\' 3"  (1.6 m)  Wt 173 lb (78.472 kg)  BMI 30.65 kg/m2 GENERAL:  Well appearing HEENT:  Pupils equal round and reactive, fundi not visualized, oral mucosa unremarkable NECK:  No jugular venous distention, waveform within normal limits, carotid upstroke brisk and symmetric, transmitted systolic bruit, no thyromegaly LYMPHATICS:  No cervical, inguinal adenopathy LUNGS:  Clear to auscultation bilaterally BACK:  No CVA tenderness CHEST:  Unremarkable HEART:  PMI not displaced or sustained,S1 and S2 within normal limits, no S3, no S4, no clicks, no rubs, apical systolic murmur early peaking and radiating out the outflow tract. ABD:  Flat, positive bowel sounds normal in frequency in pitch, no bruits, no rebound, no guarding, no midline pulsatile mass, no hepatomegaly, no splenomegaly EXT:  2 plus pulses throughout, right arm edema, clotted right arm fistula, thrill bruit left upper arm fistula,  no cyanosis no clubbing SKIN:  No rashes no nodules NEURO:  Cranial nerves II through XII grossly intact, motor grossly intact throughout PSYCH:  Cognitively intact, oriented to person place and time  EKG:  Sinus rhythm, rate 67, left axis deviation, premature ventricular contractions, poor anterior R wave progression, no acute ST-T wave changes.  ASSESSMENT AND PLAN

## 2010-12-02 ENCOUNTER — Encounter: Payer: Self-pay | Admitting: *Deleted

## 2010-12-03 DIAGNOSIS — T8859XA Other complications of anesthesia, initial encounter: Secondary | ICD-10-CM

## 2010-12-03 HISTORY — DX: Other complications of anesthesia, initial encounter: T88.59XA

## 2010-12-07 ENCOUNTER — Inpatient Hospital Stay: Payer: Medicare Other | Admitting: Vascular Surgery

## 2010-12-20 ENCOUNTER — Other Ambulatory Visit (HOSPITAL_COMMUNITY): Payer: Self-pay | Admitting: *Deleted

## 2010-12-22 ENCOUNTER — Ambulatory Visit (HOSPITAL_COMMUNITY)
Admission: RE | Admit: 2010-12-22 | Discharge: 2010-12-22 | Disposition: A | Discharge status: Home / Self Care | Payer: Medicare Other | Source: Ambulatory Visit | Attending: Nephrology | Admitting: Nephrology

## 2010-12-22 ENCOUNTER — Other Ambulatory Visit: Payer: Self-pay | Admitting: Nephrology

## 2010-12-22 DIAGNOSIS — N189 Chronic kidney disease, unspecified: Secondary | ICD-10-CM

## 2010-12-22 MED ORDER — DIPHENHYDRAMINE HCL 25 MG PO CAPS: 25.0000 mg | ORAL_CAPSULE | Freq: Once | ORAL | Status: DC

## 2010-12-22 MED ORDER — CAMPHOR-MENTHOL 0.5-0.5 % EX LOTN: 1.0000 "application " | TOPICAL_LOTION | Freq: Three times a day (TID) | CUTANEOUS | Status: DC | PRN

## 2010-12-22 MED ORDER — DOCUSATE SODIUM 283 MG RE ENEM: 1.0000 | ENEMA | RECTAL | Status: DC | PRN

## 2010-12-22 MED ORDER — ZOLPIDEM TARTRATE 5 MG PO TABS: 5.0000 mg | ORAL_TABLET | Freq: Every evening | ORAL | Status: DC | PRN

## 2010-12-22 MED ORDER — NEPRO/CARBSTEADY PO LIQD: 237.0000 mL | Freq: Three times a day (TID) | ORAL | Status: DC | PRN

## 2010-12-22 MED ORDER — CALCIUM CARBONATE 1250 MG/5ML PO SUSP: 500.0000 mg | Freq: Four times a day (QID) | ORAL | Status: DC | PRN

## 2010-12-22 MED ORDER — ACETAMINOPHEN 325 MG PO TABS: 650.0000 mg | ORAL_TABLET | Freq: Four times a day (QID) | ORAL | Status: DC | PRN

## 2010-12-22 MED ORDER — ACETAMINOPHEN 325 MG PO TABS: 650.0000 mg | ORAL_TABLET | Freq: Once | ORAL | Status: DC

## 2010-12-22 MED ORDER — ACETAMINOPHEN 650 MG RE SUPP: 650.0000 mg | Freq: Four times a day (QID) | RECTAL | Status: DC | PRN

## 2010-12-22 MED ORDER — ONDANSETRON HCL 4 MG PO TABS: 4.0000 mg | ORAL_TABLET | Freq: Four times a day (QID) | ORAL | Status: DC | PRN

## 2010-12-22 MED ORDER — SODIUM CHLORIDE 0.9 % IV SOLN: 4.0000 mg | Freq: Four times a day (QID) | INTRAVENOUS | Status: DC | PRN

## 2010-12-22 MED ORDER — HYDROXYZINE HCL 10 MG PO TABS: 25.0000 mg | ORAL_TABLET | Freq: Three times a day (TID) | ORAL | Status: DC | PRN

## 2010-12-22 NOTE — H&P (Signed)
Short History and Physical Form  12/22/2010,3:52 PM   HPI: needs transfusion, unable to be done at short stay due to limited access  [name] is an 65 y.o. female. She was discovered to have a hgb of 6.9 in the face of multiple access procedures and possible GI bleeding.  She needs a transfusion, was not able to be done as OP so will come to [facility] tomorrow December 21 for hemodialysis and blood transfusion to be discharged tomorrow after hemodialysis   [facility] [address]   Past Medical History  Diagnosis Date  . CAD (coronary artery disease)     stent to RCA  . CVA (cerebral infarction) 2003    no apparent residual  . Renal failure     related to hypertension  . Hypothyroidism   . PVD (peripheral vascular disease)   . Hyperlipidemia   . Positive PPD     completed rifampin  . Diastolic congestive heart failure   . Cancer     clear cell cancer  . Renal insufficiency   . Hypertension   . COPD (chronic obstructive pulmonary disease) t   Allergies  Allergen Reactions  . Ace Inhibitors   . Aspirin   . Heparin   . Meperidine Hcl   . Morphine   . Penicillins   . Pentazocine Lactate   . Phenytoin    Prior to Admission medications   Medication Sig Start Date End Date Taking? Authorizing Provider  acyclovir (ZOVIRAX) 800 MG tablet Take 800 mg by mouth daily.  11/07/10   Historical Provider, MD  amLODipine (NORVASC) 10 MG tablet Take 10 mg by mouth daily.      Historical Provider, MD  aspirin EC 81 MG tablet Take 1 tablet (81 mg total) by mouth daily. 11/29/10 11/29/11  Rollene Rotunda, MD  carvedilol (COREG) 25 MG tablet Take 25 mg by mouth 2 (two) times daily with a meal.  10/30/10   Historical Provider, MD  citalopram (CELEXA) 20 MG tablet Take 20 mg by mouth daily.      Historical Provider, MD  DEXILANT 60 MG capsule Take 60 mg by mouth daily.  11/27/10   Historical Provider, MD  hydrALAZINE (APRESOLINE) 25 MG tablet Take 25 mg by mouth 2 (two) times daily.  11/07/10    Historical Provider, MD  HYDROcodone-acetaminophen (NORCO) 10-325 MG per tablet Take 1 tablet by mouth every 6 (six) hours as needed.      Historical Provider, MD  levothyroxine (SYNTHROID, LEVOTHROID) 88 MCG tablet Take 88 mcg by mouth daily.      Historical Provider, MD  LORazepam (ATIVAN) 2 MG tablet Take 2 mg by mouth as needed.      Historical Provider, MD  multivitamin (RENA-VIT) TABS tablet Take 1 tablet by mouth daily.      Historical Provider, MD  NITROSTAT 0.4 MG SL tablet  09/16/10   Historical Provider, MD  OXYCONTIN 10 MG 12 hr tablet  08/24/10   Historical Provider, MD  polyethylene glycol powder (GLYCOLAX/MIRALAX) powder  09/28/10   Historical Provider, MD  pravastatin (PRAVACHOL) 40 MG tablet Take 80 mg by mouth at bedtime.      Historical Provider, MD  sevelamer (RENVELA) 800 MG tablet Take 1,600 mg by mouth 3 (three) times daily with meals.      Historical Provider, MD   No results found for this or any previous visit (from the past 48 hour(s)). No results found.  Physical Exam: There were no vitals filed for this  visit. General: mild distress Heart: RRR Lungs: mostly clear Abdomen"soft, nontender Extremities:NAD Access: L sided hero  Assessment/Plan: 1 Anemia- admit on December 21 for transfusion and hemodialysis, followed by discharge 2 ESRD: tomorrrow will keep patient on schedule Continue max ESA  [name] A  12/22/2010, 3:52 PM

## 2010-12-22 NOTE — Progress Notes (Signed)
Dr. Kathrene Bongo spoke with the pt and the pt relayed that Dr. Kathrene Bongo said to d/c her and she was going to have her admitted to dialysis tomorrow.

## 2010-12-22 NOTE — Progress Notes (Signed)
After approximately 6-7 attempt and 1 IV therapist using ultrasound, IV team unable to get IV or type and screen. Called [facility] who told me to call [facility] who told me to page Dr. Kathrene Bongo directly. I paged Dr. Ervin Knack and informed her about no access and no type and screen.

## 2010-12-23 ENCOUNTER — Other Ambulatory Visit: Payer: Self-pay | Admitting: Nephrology

## 2010-12-23 ENCOUNTER — Inpatient Hospital Stay (HOSPITAL_COMMUNITY)
Admission: AD | Admit: 2010-12-23 | Discharge: 2010-12-23 | DRG: 811 | Disposition: A | Discharge status: Home / Self Care | Payer: Medicare Other | Source: Ambulatory Visit | Attending: Nephrology | Admitting: Nephrology

## 2010-12-23 ENCOUNTER — Inpatient Hospital Stay (HOSPITAL_COMMUNITY): Admission: RE | Admit: 2010-12-23 | Payer: Medicare Other | Source: Ambulatory Visit | Admitting: Nephrology

## 2010-12-23 DIAGNOSIS — D509 Iron deficiency anemia, unspecified: Principal | ICD-10-CM | POA: Diagnosis present

## 2010-12-23 DIAGNOSIS — Z992 Dependence on renal dialysis: Secondary | ICD-10-CM

## 2010-12-23 DIAGNOSIS — Z9861 Coronary angioplasty status: Secondary | ICD-10-CM

## 2010-12-23 DIAGNOSIS — I2510 Atherosclerotic heart disease of native coronary artery without angina pectoris: Secondary | ICD-10-CM | POA: Diagnosis present

## 2010-12-23 DIAGNOSIS — I5032 Chronic diastolic (congestive) heart failure: Secondary | ICD-10-CM | POA: Diagnosis present

## 2010-12-23 DIAGNOSIS — I509 Heart failure, unspecified: Secondary | ICD-10-CM | POA: Diagnosis present

## 2010-12-23 DIAGNOSIS — N186 End stage renal disease: Secondary | ICD-10-CM | POA: Diagnosis present

## 2010-12-23 MED ORDER — PARICALCITOL 5 MCG/ML IV SOLN
13.0000 ug | INTRAVENOUS | Status: DC
  Administered 2010-12-23: 13 ug via INTRAVENOUS
  Filled 2010-12-23: qty 2.6

## 2010-12-23 MED ORDER — EPOETIN ALFA 40000 UNIT/ML IJ SOLN
28000.0000 [IU] | Freq: Once | INTRAMUSCULAR | Status: AC
  Administered 2010-12-23: 28000 [IU] via SUBCUTANEOUS
  Filled 2010-12-23: qty 1

## 2010-12-23 MED ORDER — SODIUM CHLORIDE 0.9 % IJ SOLN
125.0000 mg | Freq: Once | INTRAVENOUS | Status: AC
  Administered 2010-12-23: 125 mg via INTRAVENOUS
  Filled 2010-12-23 (×2): qty 10

## 2010-12-23 MED ORDER — PARICALCITOL 5 MCG/ML IV SOLN
INTRAVENOUS | Status: AC
  Administered 2010-12-23: 13 ug via INTRAVENOUS
  Filled 2010-12-23: qty 3

## 2010-12-23 MED ORDER — DIPHENHYDRAMINE HCL 25 MG PO CAPS
ORAL_CAPSULE | ORAL | Status: AC
  Administered 2010-12-23: 25 mg via ORAL
  Filled 2010-12-23: qty 1

## 2010-12-23 MED ORDER — OXYCODONE HCL 5 MG PO TABS: 5.0000 mg | ORAL_TABLET | Freq: Once | ORAL | Status: DC

## 2010-12-23 MED ORDER — DIPHENHYDRAMINE HCL 25 MG PO CAPS
25.0000 mg | ORAL_CAPSULE | Freq: Once | ORAL | Status: AC
  Administered 2010-12-23: 25 mg via ORAL

## 2010-12-23 NOTE — Progress Notes (Signed)
Reviewed labs outpt center drawn 12/21/10 revealing Hgb 7.2; T-sat 12%. Currently scheduled for transfusion. Will initiate iron bolus (Venofer 100mg  IV q tx x 10 with first dose today)

## 2010-12-24 LAB — TYPE AND SCREEN
ABO/RH(D): B POS
Antibody Screen: NEGATIVE
Unit division: 0

## 2011-01-04 ENCOUNTER — Ambulatory Visit: Payer: Self-pay | Admitting: Vascular Surgery

## 2011-01-04 DIAGNOSIS — E119 Type 2 diabetes mellitus without complications: Secondary | ICD-10-CM | POA: Diagnosis not present

## 2011-01-04 DIAGNOSIS — R609 Edema, unspecified: Secondary | ICD-10-CM | POA: Diagnosis not present

## 2011-01-04 DIAGNOSIS — I252 Old myocardial infarction: Secondary | ICD-10-CM | POA: Diagnosis not present

## 2011-01-04 DIAGNOSIS — M7989 Other specified soft tissue disorders: Secondary | ICD-10-CM | POA: Diagnosis not present

## 2011-01-04 DIAGNOSIS — Z8679 Personal history of other diseases of the circulatory system: Secondary | ICD-10-CM | POA: Diagnosis not present

## 2011-01-04 DIAGNOSIS — Z8711 Personal history of peptic ulcer disease: Secondary | ICD-10-CM | POA: Diagnosis not present

## 2011-01-04 DIAGNOSIS — I120 Hypertensive chronic kidney disease with stage 5 chronic kidney disease or end stage renal disease: Secondary | ICD-10-CM | POA: Diagnosis not present

## 2011-01-04 DIAGNOSIS — I498 Other specified cardiac arrhythmias: Secondary | ICD-10-CM | POA: Diagnosis not present

## 2011-01-04 DIAGNOSIS — Z79899 Other long term (current) drug therapy: Secondary | ICD-10-CM | POA: Diagnosis not present

## 2011-01-04 DIAGNOSIS — I10 Essential (primary) hypertension: Secondary | ICD-10-CM | POA: Diagnosis not present

## 2011-01-04 DIAGNOSIS — T82898A Other specified complication of vascular prosthetic devices, implants and grafts, initial encounter: Secondary | ICD-10-CM | POA: Diagnosis not present

## 2011-01-04 DIAGNOSIS — I509 Heart failure, unspecified: Secondary | ICD-10-CM | POA: Diagnosis not present

## 2011-01-04 DIAGNOSIS — Z905 Acquired absence of kidney: Secondary | ICD-10-CM | POA: Diagnosis not present

## 2011-01-04 DIAGNOSIS — Z992 Dependence on renal dialysis: Secondary | ICD-10-CM | POA: Diagnosis not present

## 2011-01-04 DIAGNOSIS — Z85528 Personal history of other malignant neoplasm of kidney: Secondary | ICD-10-CM | POA: Diagnosis not present

## 2011-01-04 DIAGNOSIS — F17200 Nicotine dependence, unspecified, uncomplicated: Secondary | ICD-10-CM | POA: Diagnosis not present

## 2011-01-04 DIAGNOSIS — K219 Gastro-esophageal reflux disease without esophagitis: Secondary | ICD-10-CM | POA: Diagnosis not present

## 2011-01-04 DIAGNOSIS — N186 End stage renal disease: Secondary | ICD-10-CM | POA: Diagnosis not present

## 2011-01-04 DIAGNOSIS — J40 Bronchitis, not specified as acute or chronic: Secondary | ICD-10-CM | POA: Diagnosis not present

## 2011-01-04 DIAGNOSIS — Z9861 Coronary angioplasty status: Secondary | ICD-10-CM | POA: Diagnosis not present

## 2011-01-05 DIAGNOSIS — N039 Chronic nephritic syndrome with unspecified morphologic changes: Secondary | ICD-10-CM | POA: Diagnosis not present

## 2011-01-05 DIAGNOSIS — N186 End stage renal disease: Secondary | ICD-10-CM | POA: Diagnosis not present

## 2011-01-05 DIAGNOSIS — Z992 Dependence on renal dialysis: Secondary | ICD-10-CM | POA: Diagnosis not present

## 2011-01-05 DIAGNOSIS — N259 Disorder resulting from impaired renal tubular function, unspecified: Secondary | ICD-10-CM | POA: Diagnosis not present

## 2011-01-05 DIAGNOSIS — D509 Iron deficiency anemia, unspecified: Secondary | ICD-10-CM | POA: Diagnosis not present

## 2011-01-05 DIAGNOSIS — N2581 Secondary hyperparathyroidism of renal origin: Secondary | ICD-10-CM | POA: Diagnosis not present

## 2011-01-06 DIAGNOSIS — N2581 Secondary hyperparathyroidism of renal origin: Secondary | ICD-10-CM | POA: Diagnosis not present

## 2011-01-06 DIAGNOSIS — Z992 Dependence on renal dialysis: Secondary | ICD-10-CM | POA: Diagnosis not present

## 2011-01-06 DIAGNOSIS — D631 Anemia in chronic kidney disease: Secondary | ICD-10-CM | POA: Diagnosis not present

## 2011-01-06 DIAGNOSIS — N259 Disorder resulting from impaired renal tubular function, unspecified: Secondary | ICD-10-CM | POA: Diagnosis not present

## 2011-01-06 DIAGNOSIS — N186 End stage renal disease: Secondary | ICD-10-CM | POA: Diagnosis not present

## 2011-01-06 DIAGNOSIS — D509 Iron deficiency anemia, unspecified: Secondary | ICD-10-CM | POA: Diagnosis not present

## 2011-01-07 NOTE — Discharge Summary (Signed)
Physician Discharge Summary  Patient ID: [name] MRN: [id number] DOB/AGE: 06-14-1945 66 y.o.  Admit date: 12/23/2010 Discharge date: 12/23/10  Discharge Diagnoses:  Active Problems:   Anemia, iron deficiency  ESRD    Discharged Condition: stable  Hospital Course: [name] is an 66 y.o. AA female with ESRD on chronic HD support every MWF at [facility]. She was discovered to have a hgb of 6.9 in the face of multiple access procedures and possible GI bleeding. She was unable to have an outpatient transfusion when needed and in light of her PMH of CAD s/p RCA stenting and Diastolic CHF, she was therefore admitted for transfusion during her normally scheduled hemodialysis treatment day. Due to her iron deficient T-sat of 12%, she did also receive the first bolus of IV Venofer. She tolerated all the desired treatments well with a post-HD BP 121/62, HR 75 and weight 75.1 kg. She was then discharged to return to her outpatient hemodialysis unit for follow up labs and further treatment as indicated.   Treatments: Transfusion, Hemodialysis Blood pressure 121/62, pulse 75, temperature 97.3 F (36.3 C), temperature source Oral, resp. rate 18, weight 75.1 kg (165 lb 9.1 oz), SpO2 96.00%.  Disposition: Home or Self Care   Discharge Medication List as of 12/23/2010  5:45 PM    CONTINUE these medications which have NOT CHANGED   Details  acyclovir (ZOVIRAX) 800 MG tablet Take 1 tablet by mouth Daily., Starting 11/07/2010, Until Discontinued, Historical Med    amLODipine (NORVASC) 10 MG tablet Take 10 mg by mouth daily. , Until Discontinued, Historical Med    aspirin EC 81 MG tablet Take 81 mg by mouth daily.  , Starting 11/29/2010, Until Wed 11/29/11, Historical Med    carvedilol (COREG) 25 MG tablet Take 25 mg by mouth 2 (two) times daily with a meal. , Starting 10/30/2010, Until Discontinued, Historical Med    citalopram (CELEXA) 20 MG tablet Take 20 mg by mouth daily.  , Until  Discontinued, Historical Med    DEXILANT 60 MG capsule Take 60 mg by mouth daily. , Starting 11/27/2010, Until Discontinued, Historical Med    hydrALAZINE (APRESOLINE) 25 MG tablet Take 25 mg by mouth 2 (two) times daily. , Starting 11/07/2010, Until Discontinued, Historical Med    HYDROcodone-acetaminophen (VICODIN) 5-500 MG per tablet Take 1 tablet by mouth every 6 (six) hours as needed. For pain. , Until Discontinued, Historical Med    levothyroxine (SYNTHROID, LEVOTHROID) 88 MCG tablet Take 88 mcg by mouth daily.  , Until Discontinued, Historical Med    LORazepam (ATIVAN) 2 MG tablet Take 2 mg by mouth as needed.  , Until Discontinued, Historical Med    multivitamin (RENA-VIT) TABS tablet Take 1 tablet by mouth daily.  , Until Discontinued, Historical Med    NITROSTAT 0.4 MG SL tablet Place 0.4 mg under the tongue every 5 (five) minutes as needed. For chest pain., Starting 09/16/2010, Until Discontinued, Historical Med    polyethylene glycol powder (GLYCOLAX/MIRALAX) powder Take 17 g by mouth daily as needed. For constipation., Starting 09/28/2010, Until Discontinued, Historical Med    pravastatin (PRAVACHOL) 40 MG tablet Take 80 mg by mouth at bedtime.  , Until Discontinued, Historical Med    sevelamer (RENVELA) 800 MG tablet Take 1,600 mg by mouth 3 (three) times daily with meals.  , Until Discontinued, Historical Med         Signed: Samuel Germany, [facility] Pager [phone] 01/07/2011, 8:15 AM  Attending Physician: Dr. Caryl Asp  Kathrene Bongo

## 2011-01-09 DIAGNOSIS — N2581 Secondary hyperparathyroidism of renal origin: Secondary | ICD-10-CM | POA: Diagnosis not present

## 2011-01-09 DIAGNOSIS — Z992 Dependence on renal dialysis: Secondary | ICD-10-CM | POA: Diagnosis not present

## 2011-01-09 DIAGNOSIS — D631 Anemia in chronic kidney disease: Secondary | ICD-10-CM | POA: Diagnosis not present

## 2011-01-09 DIAGNOSIS — N186 End stage renal disease: Secondary | ICD-10-CM | POA: Diagnosis not present

## 2011-01-09 DIAGNOSIS — D509 Iron deficiency anemia, unspecified: Secondary | ICD-10-CM | POA: Diagnosis not present

## 2011-01-09 DIAGNOSIS — N259 Disorder resulting from impaired renal tubular function, unspecified: Secondary | ICD-10-CM | POA: Diagnosis not present

## 2011-01-10 DIAGNOSIS — T859XXA Unspecified complication of internal prosthetic device, implant and graft, initial encounter: Secondary | ICD-10-CM | POA: Diagnosis not present

## 2011-01-10 DIAGNOSIS — N186 End stage renal disease: Secondary | ICD-10-CM | POA: Diagnosis not present

## 2011-01-11 DIAGNOSIS — D631 Anemia in chronic kidney disease: Secondary | ICD-10-CM | POA: Diagnosis not present

## 2011-01-11 DIAGNOSIS — N2581 Secondary hyperparathyroidism of renal origin: Secondary | ICD-10-CM | POA: Diagnosis not present

## 2011-01-11 DIAGNOSIS — Z992 Dependence on renal dialysis: Secondary | ICD-10-CM | POA: Diagnosis not present

## 2011-01-11 DIAGNOSIS — D509 Iron deficiency anemia, unspecified: Secondary | ICD-10-CM | POA: Diagnosis not present

## 2011-01-11 DIAGNOSIS — N186 End stage renal disease: Secondary | ICD-10-CM | POA: Diagnosis not present

## 2011-01-11 DIAGNOSIS — N259 Disorder resulting from impaired renal tubular function, unspecified: Secondary | ICD-10-CM | POA: Diagnosis not present

## 2011-01-11 NOTE — Discharge Summary (Signed)
Patient seen and examined, agree with above note with above modifications.  Eloyse Causey, MD 01/11/2011    

## 2011-01-13 DIAGNOSIS — N259 Disorder resulting from impaired renal tubular function, unspecified: Secondary | ICD-10-CM | POA: Diagnosis not present

## 2011-01-13 DIAGNOSIS — Z992 Dependence on renal dialysis: Secondary | ICD-10-CM | POA: Diagnosis not present

## 2011-01-13 DIAGNOSIS — D631 Anemia in chronic kidney disease: Secondary | ICD-10-CM | POA: Diagnosis not present

## 2011-01-13 DIAGNOSIS — N2581 Secondary hyperparathyroidism of renal origin: Secondary | ICD-10-CM | POA: Diagnosis not present

## 2011-01-13 DIAGNOSIS — N186 End stage renal disease: Secondary | ICD-10-CM | POA: Diagnosis not present

## 2011-01-13 DIAGNOSIS — D509 Iron deficiency anemia, unspecified: Secondary | ICD-10-CM | POA: Diagnosis not present

## 2011-01-16 ENCOUNTER — Ambulatory Visit: Payer: Self-pay | Admitting: Vascular Surgery

## 2011-01-16 DIAGNOSIS — F17200 Nicotine dependence, unspecified, uncomplicated: Secondary | ICD-10-CM | POA: Diagnosis not present

## 2011-01-16 DIAGNOSIS — Z79899 Other long term (current) drug therapy: Secondary | ICD-10-CM | POA: Diagnosis not present

## 2011-01-16 DIAGNOSIS — I70219 Atherosclerosis of native arteries of extremities with intermittent claudication, unspecified extremity: Secondary | ICD-10-CM | POA: Diagnosis not present

## 2011-01-16 DIAGNOSIS — N259 Disorder resulting from impaired renal tubular function, unspecified: Secondary | ICD-10-CM | POA: Diagnosis not present

## 2011-01-16 DIAGNOSIS — Z992 Dependence on renal dialysis: Secondary | ICD-10-CM | POA: Diagnosis not present

## 2011-01-16 DIAGNOSIS — N039 Chronic nephritic syndrome with unspecified morphologic changes: Secondary | ICD-10-CM | POA: Diagnosis not present

## 2011-01-16 DIAGNOSIS — T82898A Other specified complication of vascular prosthetic devices, implants and grafts, initial encounter: Secondary | ICD-10-CM | POA: Diagnosis not present

## 2011-01-16 DIAGNOSIS — I519 Heart disease, unspecified: Secondary | ICD-10-CM | POA: Diagnosis not present

## 2011-01-16 DIAGNOSIS — N186 End stage renal disease: Secondary | ICD-10-CM | POA: Diagnosis not present

## 2011-01-16 DIAGNOSIS — I509 Heart failure, unspecified: Secondary | ICD-10-CM | POA: Diagnosis not present

## 2011-01-16 DIAGNOSIS — N2581 Secondary hyperparathyroidism of renal origin: Secondary | ICD-10-CM | POA: Diagnosis not present

## 2011-01-16 DIAGNOSIS — D509 Iron deficiency anemia, unspecified: Secondary | ICD-10-CM | POA: Diagnosis not present

## 2011-01-16 DIAGNOSIS — Z9889 Other specified postprocedural states: Secondary | ICD-10-CM | POA: Diagnosis not present

## 2011-01-16 DIAGNOSIS — Z8673 Personal history of transient ischemic attack (TIA), and cerebral infarction without residual deficits: Secondary | ICD-10-CM | POA: Diagnosis not present

## 2011-01-16 LAB — POTASSIUM: Potassium: 4.9 mmol/L (ref 3.5–5.1)

## 2011-01-18 DIAGNOSIS — N2581 Secondary hyperparathyroidism of renal origin: Secondary | ICD-10-CM | POA: Diagnosis not present

## 2011-01-18 DIAGNOSIS — D509 Iron deficiency anemia, unspecified: Secondary | ICD-10-CM | POA: Diagnosis not present

## 2011-01-18 DIAGNOSIS — N259 Disorder resulting from impaired renal tubular function, unspecified: Secondary | ICD-10-CM | POA: Diagnosis not present

## 2011-01-18 DIAGNOSIS — Z992 Dependence on renal dialysis: Secondary | ICD-10-CM | POA: Diagnosis not present

## 2011-01-18 DIAGNOSIS — N186 End stage renal disease: Secondary | ICD-10-CM | POA: Diagnosis not present

## 2011-01-18 DIAGNOSIS — D631 Anemia in chronic kidney disease: Secondary | ICD-10-CM | POA: Diagnosis not present

## 2011-01-20 DIAGNOSIS — N2581 Secondary hyperparathyroidism of renal origin: Secondary | ICD-10-CM | POA: Diagnosis not present

## 2011-01-20 DIAGNOSIS — N039 Chronic nephritic syndrome with unspecified morphologic changes: Secondary | ICD-10-CM | POA: Diagnosis not present

## 2011-01-20 DIAGNOSIS — Z992 Dependence on renal dialysis: Secondary | ICD-10-CM | POA: Diagnosis not present

## 2011-01-20 DIAGNOSIS — N186 End stage renal disease: Secondary | ICD-10-CM | POA: Diagnosis not present

## 2011-01-20 DIAGNOSIS — N259 Disorder resulting from impaired renal tubular function, unspecified: Secondary | ICD-10-CM | POA: Diagnosis not present

## 2011-01-20 DIAGNOSIS — D509 Iron deficiency anemia, unspecified: Secondary | ICD-10-CM | POA: Diagnosis not present

## 2011-01-23 DIAGNOSIS — D631 Anemia in chronic kidney disease: Secondary | ICD-10-CM | POA: Diagnosis not present

## 2011-01-23 DIAGNOSIS — N186 End stage renal disease: Secondary | ICD-10-CM | POA: Diagnosis not present

## 2011-01-23 DIAGNOSIS — D509 Iron deficiency anemia, unspecified: Secondary | ICD-10-CM | POA: Diagnosis not present

## 2011-01-23 DIAGNOSIS — N259 Disorder resulting from impaired renal tubular function, unspecified: Secondary | ICD-10-CM | POA: Diagnosis not present

## 2011-01-23 DIAGNOSIS — Z992 Dependence on renal dialysis: Secondary | ICD-10-CM | POA: Diagnosis not present

## 2011-01-23 DIAGNOSIS — N2581 Secondary hyperparathyroidism of renal origin: Secondary | ICD-10-CM | POA: Diagnosis not present

## 2011-01-25 DIAGNOSIS — E1129 Type 2 diabetes mellitus with other diabetic kidney complication: Secondary | ICD-10-CM | POA: Diagnosis not present

## 2011-01-25 DIAGNOSIS — Z992 Dependence on renal dialysis: Secondary | ICD-10-CM | POA: Diagnosis not present

## 2011-01-25 DIAGNOSIS — N186 End stage renal disease: Secondary | ICD-10-CM | POA: Diagnosis not present

## 2011-01-25 DIAGNOSIS — D509 Iron deficiency anemia, unspecified: Secondary | ICD-10-CM | POA: Diagnosis not present

## 2011-01-25 DIAGNOSIS — N259 Disorder resulting from impaired renal tubular function, unspecified: Secondary | ICD-10-CM | POA: Diagnosis not present

## 2011-01-25 DIAGNOSIS — D631 Anemia in chronic kidney disease: Secondary | ICD-10-CM | POA: Diagnosis not present

## 2011-01-25 DIAGNOSIS — N2581 Secondary hyperparathyroidism of renal origin: Secondary | ICD-10-CM | POA: Diagnosis not present

## 2011-01-26 DIAGNOSIS — N281 Cyst of kidney, acquired: Secondary | ICD-10-CM | POA: Diagnosis not present

## 2011-01-26 DIAGNOSIS — D4959 Neoplasm of unspecified behavior of other genitourinary organ: Secondary | ICD-10-CM | POA: Diagnosis not present

## 2011-01-26 DIAGNOSIS — Z85528 Personal history of other malignant neoplasm of kidney: Secondary | ICD-10-CM | POA: Diagnosis not present

## 2011-01-26 DIAGNOSIS — C649 Malignant neoplasm of unspecified kidney, except renal pelvis: Secondary | ICD-10-CM | POA: Diagnosis not present

## 2011-01-26 DIAGNOSIS — N2889 Other specified disorders of kidney and ureter: Secondary | ICD-10-CM | POA: Diagnosis not present

## 2011-01-27 DIAGNOSIS — Z992 Dependence on renal dialysis: Secondary | ICD-10-CM | POA: Diagnosis not present

## 2011-01-27 DIAGNOSIS — N2581 Secondary hyperparathyroidism of renal origin: Secondary | ICD-10-CM | POA: Diagnosis not present

## 2011-01-27 DIAGNOSIS — N186 End stage renal disease: Secondary | ICD-10-CM | POA: Diagnosis not present

## 2011-01-27 DIAGNOSIS — D631 Anemia in chronic kidney disease: Secondary | ICD-10-CM | POA: Diagnosis not present

## 2011-01-27 DIAGNOSIS — N259 Disorder resulting from impaired renal tubular function, unspecified: Secondary | ICD-10-CM | POA: Diagnosis not present

## 2011-01-27 DIAGNOSIS — D509 Iron deficiency anemia, unspecified: Secondary | ICD-10-CM | POA: Diagnosis not present

## 2011-01-30 DIAGNOSIS — D509 Iron deficiency anemia, unspecified: Secondary | ICD-10-CM | POA: Diagnosis not present

## 2011-01-30 DIAGNOSIS — N259 Disorder resulting from impaired renal tubular function, unspecified: Secondary | ICD-10-CM | POA: Diagnosis not present

## 2011-01-30 DIAGNOSIS — D631 Anemia in chronic kidney disease: Secondary | ICD-10-CM | POA: Diagnosis not present

## 2011-01-30 DIAGNOSIS — N2581 Secondary hyperparathyroidism of renal origin: Secondary | ICD-10-CM | POA: Diagnosis not present

## 2011-01-30 DIAGNOSIS — Z992 Dependence on renal dialysis: Secondary | ICD-10-CM | POA: Diagnosis not present

## 2011-01-30 DIAGNOSIS — N186 End stage renal disease: Secondary | ICD-10-CM | POA: Diagnosis not present

## 2011-01-31 DIAGNOSIS — Z992 Dependence on renal dialysis: Secondary | ICD-10-CM | POA: Diagnosis not present

## 2011-01-31 DIAGNOSIS — N186 End stage renal disease: Secondary | ICD-10-CM | POA: Diagnosis not present

## 2011-01-31 DIAGNOSIS — N039 Chronic nephritic syndrome with unspecified morphologic changes: Secondary | ICD-10-CM | POA: Diagnosis not present

## 2011-01-31 DIAGNOSIS — D509 Iron deficiency anemia, unspecified: Secondary | ICD-10-CM | POA: Diagnosis not present

## 2011-01-31 DIAGNOSIS — N259 Disorder resulting from impaired renal tubular function, unspecified: Secondary | ICD-10-CM | POA: Diagnosis not present

## 2011-01-31 DIAGNOSIS — N2581 Secondary hyperparathyroidism of renal origin: Secondary | ICD-10-CM | POA: Diagnosis not present

## 2011-02-01 DIAGNOSIS — N186 End stage renal disease: Secondary | ICD-10-CM | POA: Diagnosis not present

## 2011-02-01 DIAGNOSIS — D509 Iron deficiency anemia, unspecified: Secondary | ICD-10-CM | POA: Diagnosis not present

## 2011-02-01 DIAGNOSIS — N2581 Secondary hyperparathyroidism of renal origin: Secondary | ICD-10-CM | POA: Diagnosis not present

## 2011-02-01 DIAGNOSIS — D631 Anemia in chronic kidney disease: Secondary | ICD-10-CM | POA: Diagnosis not present

## 2011-02-01 DIAGNOSIS — N259 Disorder resulting from impaired renal tubular function, unspecified: Secondary | ICD-10-CM | POA: Diagnosis not present

## 2011-02-01 DIAGNOSIS — Z992 Dependence on renal dialysis: Secondary | ICD-10-CM | POA: Diagnosis not present

## 2011-02-02 ENCOUNTER — Other Ambulatory Visit: Payer: Self-pay | Admitting: Urology

## 2011-02-02 DIAGNOSIS — N289 Disorder of kidney and ureter, unspecified: Secondary | ICD-10-CM

## 2011-02-02 DIAGNOSIS — N281 Cyst of kidney, acquired: Secondary | ICD-10-CM | POA: Diagnosis not present

## 2011-02-02 DIAGNOSIS — D4959 Neoplasm of unspecified behavior of other genitourinary organ: Secondary | ICD-10-CM | POA: Diagnosis not present

## 2011-02-02 DIAGNOSIS — N186 End stage renal disease: Secondary | ICD-10-CM | POA: Diagnosis not present

## 2011-02-02 DIAGNOSIS — Z85528 Personal history of other malignant neoplasm of kidney: Secondary | ICD-10-CM | POA: Diagnosis not present

## 2011-02-03 DIAGNOSIS — N2581 Secondary hyperparathyroidism of renal origin: Secondary | ICD-10-CM | POA: Diagnosis not present

## 2011-02-03 DIAGNOSIS — D509 Iron deficiency anemia, unspecified: Secondary | ICD-10-CM | POA: Diagnosis not present

## 2011-02-03 DIAGNOSIS — D631 Anemia in chronic kidney disease: Secondary | ICD-10-CM | POA: Diagnosis not present

## 2011-02-03 DIAGNOSIS — N186 End stage renal disease: Secondary | ICD-10-CM | POA: Diagnosis not present

## 2011-02-07 ENCOUNTER — Ambulatory Visit
Admission: RE | Admit: 2011-02-07 | Discharge: 2011-02-07 | Disposition: A | Discharge status: Home / Self Care | Payer: Medicare Other | Source: Ambulatory Visit | Attending: Urology | Admitting: Urology

## 2011-02-07 ENCOUNTER — Other Ambulatory Visit: Payer: Self-pay | Admitting: Interventional Radiology

## 2011-02-07 DIAGNOSIS — N289 Disorder of kidney and ureter, unspecified: Secondary | ICD-10-CM

## 2011-02-07 DIAGNOSIS — N186 End stage renal disease: Secondary | ICD-10-CM | POA: Diagnosis not present

## 2011-02-07 DIAGNOSIS — I10 Essential (primary) hypertension: Secondary | ICD-10-CM | POA: Diagnosis not present

## 2011-02-07 NOTE — Progress Notes (Signed)
Patient here with her daughter, [name], for consultation for treatment of new right renal mass.  Patient's allergies, medical/surgical history and medications reviewed.  jkl

## 2011-02-16 ENCOUNTER — Inpatient Hospital Stay (HOSPITAL_COMMUNITY): Admission: RE | Admit: 2011-02-16 | Payer: Medicare Other | Source: Ambulatory Visit

## 2011-02-17 ENCOUNTER — Inpatient Hospital Stay (HOSPITAL_COMMUNITY): Payer: Medicare Other

## 2011-02-17 ENCOUNTER — Encounter (HOSPITAL_COMMUNITY): Payer: Self-pay | Admitting: Neurology

## 2011-02-17 ENCOUNTER — Emergency Department (HOSPITAL_COMMUNITY): Payer: Medicare Other

## 2011-02-17 ENCOUNTER — Inpatient Hospital Stay (HOSPITAL_COMMUNITY)
Admission: EM | Admit: 2011-02-17 | Discharge: 2011-02-27 | DRG: 286 | Disposition: A | Discharge status: Skilled Nursing Facility | Payer: Medicare Other | Attending: Internal Medicine | Admitting: Internal Medicine

## 2011-02-17 ENCOUNTER — Other Ambulatory Visit: Payer: Self-pay

## 2011-02-17 DIAGNOSIS — J4489 Other specified chronic obstructive pulmonary disease: Secondary | ICD-10-CM | POA: Diagnosis present

## 2011-02-17 DIAGNOSIS — I5181 Takotsubo syndrome: Secondary | ICD-10-CM | POA: Diagnosis not present

## 2011-02-17 DIAGNOSIS — Z880 Allergy status to penicillin: Secondary | ICD-10-CM

## 2011-02-17 DIAGNOSIS — N289 Disorder of kidney and ureter, unspecified: Secondary | ICD-10-CM

## 2011-02-17 DIAGNOSIS — Z8249 Family history of ischemic heart disease and other diseases of the circulatory system: Secondary | ICD-10-CM

## 2011-02-17 DIAGNOSIS — I219 Acute myocardial infarction, unspecified: Secondary | ICD-10-CM

## 2011-02-17 DIAGNOSIS — R0989 Other specified symptoms and signs involving the circulatory and respiratory systems: Secondary | ICD-10-CM | POA: Diagnosis not present

## 2011-02-17 DIAGNOSIS — Z7982 Long term (current) use of aspirin: Secondary | ICD-10-CM | POA: Diagnosis not present

## 2011-02-17 DIAGNOSIS — R0902 Hypoxemia: Secondary | ICD-10-CM | POA: Diagnosis present

## 2011-02-17 DIAGNOSIS — Z888 Allergy status to other drugs, medicaments and biological substances status: Secondary | ICD-10-CM

## 2011-02-17 DIAGNOSIS — I6350 Cerebral infarction due to unspecified occlusion or stenosis of unspecified cerebral artery: Secondary | ICD-10-CM | POA: Diagnosis not present

## 2011-02-17 DIAGNOSIS — I10 Essential (primary) hypertension: Secondary | ICD-10-CM | POA: Diagnosis not present

## 2011-02-17 DIAGNOSIS — R9431 Abnormal electrocardiogram [ECG] [EKG]: Secondary | ICD-10-CM | POA: Diagnosis not present

## 2011-02-17 DIAGNOSIS — F17200 Nicotine dependence, unspecified, uncomplicated: Secondary | ICD-10-CM | POA: Diagnosis present

## 2011-02-17 DIAGNOSIS — Z8673 Personal history of transient ischemic attack (TIA), and cerebral infarction without residual deficits: Secondary | ICD-10-CM

## 2011-02-17 DIAGNOSIS — J189 Pneumonia, unspecified organism: Secondary | ICD-10-CM | POA: Diagnosis present

## 2011-02-17 DIAGNOSIS — R4182 Altered mental status, unspecified: Secondary | ICD-10-CM | POA: Diagnosis not present

## 2011-02-17 DIAGNOSIS — I517 Cardiomegaly: Secondary | ICD-10-CM | POA: Diagnosis not present

## 2011-02-17 DIAGNOSIS — Z992 Dependence on renal dialysis: Secondary | ICD-10-CM

## 2011-02-17 DIAGNOSIS — Z8701 Personal history of pneumonia (recurrent): Secondary | ICD-10-CM | POA: Diagnosis not present

## 2011-02-17 DIAGNOSIS — R5381 Other malaise: Secondary | ICD-10-CM | POA: Diagnosis not present

## 2011-02-17 DIAGNOSIS — I2109 ST elevation (STEMI) myocardial infarction involving other coronary artery of anterior wall: Secondary | ICD-10-CM | POA: Diagnosis not present

## 2011-02-17 DIAGNOSIS — I2510 Atherosclerotic heart disease of native coronary artery without angina pectoris: Secondary | ICD-10-CM | POA: Diagnosis present

## 2011-02-17 DIAGNOSIS — R404 Transient alteration of awareness: Secondary | ICD-10-CM | POA: Diagnosis not present

## 2011-02-17 DIAGNOSIS — Z79899 Other long term (current) drug therapy: Secondary | ICD-10-CM

## 2011-02-17 DIAGNOSIS — E785 Hyperlipidemia, unspecified: Secondary | ICD-10-CM | POA: Diagnosis present

## 2011-02-17 DIAGNOSIS — E871 Hypo-osmolality and hyponatremia: Secondary | ICD-10-CM | POA: Diagnosis not present

## 2011-02-17 DIAGNOSIS — Z66 Do not resuscitate: Secondary | ICD-10-CM | POA: Diagnosis present

## 2011-02-17 DIAGNOSIS — Z23 Encounter for immunization: Secondary | ICD-10-CM

## 2011-02-17 DIAGNOSIS — E039 Hypothyroidism, unspecified: Secondary | ICD-10-CM | POA: Diagnosis present

## 2011-02-17 DIAGNOSIS — R51 Headache: Secondary | ICD-10-CM | POA: Diagnosis not present

## 2011-02-17 DIAGNOSIS — F329 Major depressive disorder, single episode, unspecified: Secondary | ICD-10-CM | POA: Diagnosis not present

## 2011-02-17 DIAGNOSIS — I5032 Chronic diastolic (congestive) heart failure: Secondary | ICD-10-CM

## 2011-02-17 DIAGNOSIS — I69959 Hemiplegia and hemiparesis following unspecified cerebrovascular disease affecting unspecified side: Secondary | ICD-10-CM | POA: Diagnosis not present

## 2011-02-17 DIAGNOSIS — F29 Unspecified psychosis not due to a substance or known physiological condition: Secondary | ICD-10-CM | POA: Diagnosis not present

## 2011-02-17 DIAGNOSIS — R05 Cough: Secondary | ICD-10-CM | POA: Diagnosis not present

## 2011-02-17 DIAGNOSIS — J9819 Other pulmonary collapse: Secondary | ICD-10-CM | POA: Diagnosis not present

## 2011-02-17 DIAGNOSIS — I120 Hypertensive chronic kidney disease with stage 5 chronic kidney disease or end stage renal disease: Secondary | ICD-10-CM | POA: Diagnosis not present

## 2011-02-17 DIAGNOSIS — I739 Peripheral vascular disease, unspecified: Secondary | ICD-10-CM | POA: Diagnosis present

## 2011-02-17 DIAGNOSIS — N186 End stage renal disease: Secondary | ICD-10-CM | POA: Diagnosis not present

## 2011-02-17 DIAGNOSIS — J449 Chronic obstructive pulmonary disease, unspecified: Secondary | ICD-10-CM | POA: Diagnosis present

## 2011-02-17 DIAGNOSIS — G40909 Epilepsy, unspecified, not intractable, without status epilepticus: Secondary | ICD-10-CM | POA: Diagnosis not present

## 2011-02-17 DIAGNOSIS — J984 Other disorders of lung: Secondary | ICD-10-CM | POA: Diagnosis not present

## 2011-02-17 DIAGNOSIS — I319 Disease of pericardium, unspecified: Secondary | ICD-10-CM | POA: Diagnosis not present

## 2011-02-17 DIAGNOSIS — Z09 Encounter for follow-up examination after completed treatment for conditions other than malignant neoplasm: Secondary | ICD-10-CM | POA: Diagnosis not present

## 2011-02-17 DIAGNOSIS — Z9861 Coronary angioplasty status: Secondary | ICD-10-CM | POA: Diagnosis not present

## 2011-02-17 DIAGNOSIS — R918 Other nonspecific abnormal finding of lung field: Secondary | ICD-10-CM | POA: Diagnosis not present

## 2011-02-17 DIAGNOSIS — Z905 Acquired absence of kidney: Secondary | ICD-10-CM

## 2011-02-17 DIAGNOSIS — I509 Heart failure, unspecified: Secondary | ICD-10-CM | POA: Diagnosis not present

## 2011-02-17 DIAGNOSIS — Z85528 Personal history of other malignant neoplasm of kidney: Secondary | ICD-10-CM | POA: Diagnosis not present

## 2011-02-17 DIAGNOSIS — J90 Pleural effusion, not elsewhere classified: Secondary | ICD-10-CM | POA: Diagnosis not present

## 2011-02-17 DIAGNOSIS — K219 Gastro-esophageal reflux disease without esophagitis: Secondary | ICD-10-CM | POA: Diagnosis not present

## 2011-02-17 DIAGNOSIS — I119 Hypertensive heart disease without heart failure: Secondary | ICD-10-CM | POA: Diagnosis not present

## 2011-02-17 HISTORY — DX: Adverse effect of unspecified anesthetic, initial encounter: T41.45XA

## 2011-02-17 LAB — COMPREHENSIVE METABOLIC PANEL
ALT: 8 U/L (ref 0–35)
AST: 15 U/L (ref 0–37)
Albumin: 3.3 g/dL — ABNORMAL LOW (ref 3.5–5.2)
Alkaline Phosphatase: 78 U/L (ref 39–117)
BUN: 27 mg/dL — ABNORMAL HIGH (ref 6–23)
CO2: 25 mEq/L (ref 19–32)
Calcium: 10.9 mg/dL — ABNORMAL HIGH (ref 8.4–10.5)
Chloride: 97 mEq/L (ref 96–112)
Creatinine, Ser: 7.42 mg/dL — ABNORMAL HIGH (ref 0.50–1.10)
GFR calc Af Amer: 6 mL/min — ABNORMAL LOW (ref >90)
GFR calc non Af Amer: 5 mL/min — ABNORMAL LOW (ref >90)
Glucose, Bld: 119 mg/dL — ABNORMAL HIGH (ref 70–99)
Potassium: 4.6 mEq/L (ref 3.5–5.1)
Sodium: 137 mEq/L (ref 135–145)
Total Bilirubin: 0.5 mg/dL (ref 0.3–1.2)
Total Protein: 6.8 g/dL (ref 6.0–8.3)

## 2011-02-17 LAB — PHOSPHORUS: Phosphorus: 5.4 mg/dL — ABNORMAL HIGH (ref 2.3–4.6)

## 2011-02-17 LAB — CBC
HCT: 42.8 % (ref 36.0–46.0)
Hemoglobin: 12.9 g/dL (ref 12.0–15.0)
MCH: 25.1 pg — ABNORMAL LOW (ref 26.0–34.0)
MCHC: 30.1 g/dL (ref 30.0–36.0)
MCV: 83.3 fL (ref 78.0–100.0)
Platelets: 113 10*3/uL — ABNORMAL LOW (ref 150–400)
RBC: 5.14 MIL/uL — ABNORMAL HIGH (ref 3.87–5.11)
RDW: 19.6 % — ABNORMAL HIGH (ref 11.5–15.5)
WBC: 9.3 10*3/uL (ref 4.0–10.5)

## 2011-02-17 LAB — PROTIME-INR
INR: 1 (ref 0.00–1.49)
Prothrombin Time: 13.4 seconds (ref 11.6–15.2)

## 2011-02-17 LAB — MAGNESIUM: Magnesium: 2 mg/dL (ref 1.5–2.5)

## 2011-02-17 MED ORDER — PANTOPRAZOLE SODIUM 40 MG IV SOLR
40.0000 mg | Freq: Every day | INTRAVENOUS | Status: DC
  Administered 2011-02-18 – 2011-02-21 (×5): 40 mg via INTRAVENOUS
  Filled 2011-02-17 (×10): qty 40

## 2011-02-17 MED ORDER — LORAZEPAM 1 MG PO TABS
1.0000 mg | ORAL_TABLET | Freq: Once | ORAL | Status: DC
  Filled 2011-02-17: qty 1

## 2011-02-17 MED ORDER — PARICALCITOL 5 MCG/ML IV SOLN
INTRAVENOUS | Status: AC
  Administered 2011-02-17: 6 ug via INTRAVENOUS
  Filled 2011-02-17: qty 2

## 2011-02-17 MED ORDER — LEVOTHYROXINE SODIUM 88 MCG PO TABS
88.0000 ug | ORAL_TABLET | Freq: Every day | ORAL | Status: DC
  Filled 2011-02-17 (×2): qty 1

## 2011-02-17 MED ORDER — PROMETHAZINE-CODEINE 6.25-10 MG/5ML PO SYRP: 5.0000 mL | ORAL_SOLUTION | Freq: Four times a day (QID) | ORAL | Status: DC | PRN

## 2011-02-17 MED ORDER — LORAZEPAM 2 MG/ML IJ SOLN
1.0000 mg | Freq: Four times a day (QID) | INTRAMUSCULAR | Status: DC | PRN
  Administered 2011-02-17 – 2011-02-19 (×4): 1 mg via INTRAVENOUS
  Filled 2011-02-17 (×5): qty 1

## 2011-02-17 MED ORDER — LORAZEPAM 2 MG/ML IJ SOLN
1.0000 mg | Freq: Once | INTRAMUSCULAR | Status: AC
  Administered 2011-02-17: 1 mg via INTRAVENOUS
  Filled 2011-02-17: qty 1

## 2011-02-17 MED ORDER — SEVELAMER CARBONATE 800 MG PO TABS
1600.0000 mg | ORAL_TABLET | Freq: Three times a day (TID) | ORAL | Status: DC
  Filled 2011-02-17 (×2): qty 2

## 2011-02-17 MED ORDER — HYDRALAZINE HCL 25 MG PO TABS
25.0000 mg | ORAL_TABLET | Freq: Once | ORAL | Status: DC
  Filled 2011-02-17: qty 1

## 2011-02-17 MED ORDER — CITALOPRAM HYDROBROMIDE 20 MG PO TABS
20.0000 mg | ORAL_TABLET | Freq: Every day | ORAL | Status: DC
  Filled 2011-02-17: qty 1

## 2011-02-17 MED ORDER — SODIUM CHLORIDE 0.9 % IJ SOLN
3.0000 mL | Freq: Two times a day (BID) | INTRAMUSCULAR | Status: DC
  Administered 2011-02-18 – 2011-02-26 (×16): 3 mL via INTRAVENOUS

## 2011-02-17 MED ORDER — PRAVASTATIN SODIUM 40 MG PO TABS
40.0000 mg | ORAL_TABLET | Freq: Every day | ORAL | Status: DC
  Filled 2011-02-17: qty 1

## 2011-02-17 MED ORDER — VANCOMYCIN HCL 1000 MG IV SOLR
750.0000 mg | INTRAVENOUS | Status: DC
  Administered 2011-02-20 – 2011-02-22 (×2): 750 mg via INTRAVENOUS
  Filled 2011-02-17 (×4): qty 750

## 2011-02-17 MED ORDER — ASPIRIN EC 81 MG PO TBEC
81.0000 mg | DELAYED_RELEASE_TABLET | Freq: Every day | ORAL | Status: DC
  Filled 2011-02-17: qty 1

## 2011-02-17 MED ORDER — VANCOMYCIN HCL 1000 MG IV SOLR
1500.0000 mg | Freq: Once | INTRAVENOUS | Status: AC
  Administered 2011-02-17: 1500 mg via INTRAVENOUS
  Filled 2011-02-17: qty 1500

## 2011-02-17 MED ORDER — LORAZEPAM 1 MG PO TABS: 2.0000 mg | ORAL_TABLET | Freq: Four times a day (QID) | ORAL | Status: DC | PRN

## 2011-02-17 MED ORDER — IPRATROPIUM-ALBUTEROL 18-103 MCG/ACT IN AERO
2.0000 | INHALATION_SPRAY | Freq: Four times a day (QID) | RESPIRATORY_TRACT | Status: DC
  Administered 2011-02-18 – 2011-02-20 (×4): 2 via RESPIRATORY_TRACT
  Filled 2011-02-17 (×2): qty 14.7

## 2011-02-17 MED ORDER — VANCOMYCIN HCL 1000 MG IV SOLR
1500.0000 mg | INTRAVENOUS | Status: DC
  Filled 2011-02-17: qty 1500

## 2011-02-17 MED ORDER — CARVEDILOL 25 MG PO TABS
25.0000 mg | ORAL_TABLET | Freq: Two times a day (BID) | ORAL | Status: DC
  Filled 2011-02-17 (×2): qty 1

## 2011-02-17 MED ORDER — AMLODIPINE BESYLATE 10 MG PO TABS
10.0000 mg | ORAL_TABLET | Freq: Every day | ORAL | Status: DC
  Filled 2011-02-17: qty 1

## 2011-02-17 MED ORDER — HALOPERIDOL LACTATE 5 MG/ML IJ SOLN: 2.0000 mg | Freq: Four times a day (QID) | INTRAMUSCULAR | Status: DC | PRN

## 2011-02-17 MED ORDER — PARICALCITOL 5 MCG/ML IV SOLN
6.0000 ug | INTRAVENOUS | Status: DC
  Administered 2011-02-17 – 2011-02-22 (×3): 6 ug via INTRAVENOUS
  Filled 2011-02-17 (×4): qty 1.2

## 2011-02-17 MED ORDER — DEXTROSE 5 % IV SOLN
10.0000 mg/kg | Freq: Three times a day (TID) | INTRAVENOUS | Status: DC
  Administered 2011-02-17 – 2011-02-18 (×3): 500 mg via INTRAVENOUS
  Filled 2011-02-17 (×5): qty 10

## 2011-02-17 MED ORDER — LORAZEPAM 2 MG/ML IJ SOLN
0.5000 mg | Freq: Once | INTRAMUSCULAR | Status: AC
  Administered 2011-02-17: 0.5 mg via INTRAVENOUS

## 2011-02-17 MED ORDER — LABETALOL HCL 5 MG/ML IV SOLN
10.0000 mg | Freq: Four times a day (QID) | INTRAVENOUS | Status: DC
  Administered 2011-02-18 – 2011-02-22 (×13): 10 mg via INTRAVENOUS
  Filled 2011-02-17 (×27): qty 4

## 2011-02-17 MED ORDER — HYDRALAZINE HCL 25 MG PO TABS
25.0000 mg | ORAL_TABLET | Freq: Two times a day (BID) | ORAL | Status: DC
  Filled 2011-02-17 (×2): qty 1

## 2011-02-17 MED ORDER — HYDRALAZINE HCL 20 MG/ML IJ SOLN
10.0000 mg | Freq: Four times a day (QID) | INTRAMUSCULAR | Status: DC
  Administered 2011-02-18 – 2011-02-19 (×7): 10 mg via INTRAVENOUS
  Administered 2011-02-20: 13:00:00 via INTRAVENOUS
  Administered 2011-02-21: 10 mg via INTRAVENOUS
  Administered 2011-02-21: via INTRAVENOUS
  Administered 2011-02-22: 10 mg via INTRAVENOUS
  Administered 2011-02-22: 06:00:00 via INTRAVENOUS
  Filled 2011-02-17 (×24): qty 0.5

## 2011-02-17 MED ORDER — HYDRALAZINE HCL 20 MG/ML IJ SOLN
10.0000 mg | Freq: Once | INTRAMUSCULAR | Status: AC
  Administered 2011-02-17: 10 mg via INTRAVENOUS
  Filled 2011-02-17: qty 0.5

## 2011-02-17 MED ORDER — LORAZEPAM 2 MG/ML IJ SOLN
INTRAMUSCULAR | Status: AC
  Administered 2011-02-17: 1 mg via INTRAVENOUS
  Filled 2011-02-17: qty 1

## 2011-02-17 MED ORDER — POLYETHYLENE GLYCOL 3350 17 GM/SCOOP PO POWD
17.0000 g | Freq: Every day | ORAL | Status: DC | PRN
  Filled 2011-02-17: qty 255

## 2011-02-17 MED ORDER — POLYETHYLENE GLYCOL 3350 17 G PO PACK
17.0000 g | PACK | Freq: Every day | ORAL | Status: DC | PRN
  Filled 2011-02-17: qty 1

## 2011-02-17 MED ORDER — METOPROLOL TARTRATE 1 MG/ML IV SOLN
5.0000 mg | Freq: Once | INTRAVENOUS | Status: AC
  Administered 2011-02-17: 5 mg via INTRAVENOUS
  Filled 2011-02-17: qty 5

## 2011-02-17 MED ORDER — NITROGLYCERIN 0.4 MG SL SUBL: 0.4000 mg | SUBLINGUAL_TABLET | SUBLINGUAL | Status: DC | PRN

## 2011-02-17 MED ORDER — NITROGLYCERIN 0.4 MG SL SUBL
SUBLINGUAL_TABLET | SUBLINGUAL | Status: AC
  Administered 2011-02-17: 0.3 mg via SUBLINGUAL
  Filled 2011-02-17: qty 25

## 2011-02-17 MED ORDER — DEXTROSE 5 % IV SOLN
1.0000 g | INTRAVENOUS | Status: DC
  Filled 2011-02-17: qty 1

## 2011-02-17 MED ORDER — LORAZEPAM 2 MG/ML IJ SOLN
1.0000 mg | Freq: Once | INTRAMUSCULAR | Status: AC
  Administered 2011-02-21: 1 mg via INTRAVENOUS
  Filled 2011-02-17: qty 2
  Filled 2011-02-17: qty 1

## 2011-02-17 MED ORDER — SEVELAMER CARBONATE 800 MG PO TABS: 1600.0000 mg | ORAL_TABLET | Freq: Three times a day (TID) | ORAL | Status: DC

## 2011-02-17 MED ORDER — FLUTICASONE-SALMETEROL 100-50 MCG/DOSE IN AEPB
1.0000 | INHALATION_SPRAY | Freq: Two times a day (BID) | RESPIRATORY_TRACT | Status: DC
  Administered 2011-02-18 – 2011-02-26 (×12): 1 via RESPIRATORY_TRACT
  Filled 2011-02-17: qty 14

## 2011-02-17 MED ORDER — PANTOPRAZOLE SODIUM 40 MG PO TBEC: 40.0000 mg | DELAYED_RELEASE_TABLET | Freq: Every day | ORAL | Status: DC

## 2011-02-17 MED ORDER — RENA-VITE PO TABS
1.0000 | ORAL_TABLET | Freq: Every day | ORAL | Status: DC
  Filled 2011-02-17: qty 1

## 2011-02-17 MED ORDER — DEXTROSE 5 % IV SOLN
1.0000 g | INTRAVENOUS | Status: DC
  Administered 2011-02-18 – 2011-02-22 (×4): 1 g via INTRAVENOUS
  Filled 2011-02-17 (×7): qty 1

## 2011-02-17 MED ORDER — SIMVASTATIN 40 MG PO TABS: 40.0000 mg | ORAL_TABLET | Freq: Every day | ORAL | Status: DC

## 2011-02-17 MED ORDER — ONDANSETRON 4 MG PO TBDP
4.0000 mg | ORAL_TABLET | Freq: Once | ORAL | Status: AC
  Administered 2011-02-17: 4 mg via ORAL
  Filled 2011-02-17: qty 1

## 2011-02-17 MED ORDER — NITROGLYCERIN 0.3 MG SL SUBL
0.3000 mg | SUBLINGUAL_TABLET | Freq: Once | SUBLINGUAL | Status: AC
  Administered 2011-02-17: 0.3 mg via SUBLINGUAL
  Filled 2011-02-17: qty 100

## 2011-02-17 MED ORDER — SODIUM CHLORIDE 0.9 % IJ SOLN
INTRAMUSCULAR | Status: AC
  Filled 2011-02-17: qty 10

## 2011-02-17 MED ORDER — OXYCODONE-ACETAMINOPHEN 5-325 MG PO TABS: 1.0000 | ORAL_TABLET | Freq: Four times a day (QID) | ORAL | Status: DC | PRN

## 2011-02-17 NOTE — ED Provider Notes (Signed)
History     CSN: [id number]  Arrival date & time 02/17/11  4098   First MD Initiated Contact with Patient 02/17/11 [phone]      Chief Complaint  Patient presents with  . Headache   HPI Patient presents via EMS, she has been complaining of headache and nausea.  This morning she was supposed to go to dialysis, but was found at her neighbor's house with confusion and looking disheveled.  The neighbor called the daughter, and EMS was called.  Ms. [name] is a chronic HD patient, last received dialysis on Wednesday.  She has had access procedures in both arms, but the graft in the right arm failed, and she has some chronic weakness in that hand.    The patient has a known renal mass in right kidney (hx of left nephrectomy), and was supposed to get a MRI yesterday to evaluate this mass, but there was confusion about where to go for the imaging and it was not done.   The patient is currently complaining of her feet itching, and her daughter feels like her eyes look yellow.  Her daughter also notes she has had a cough that sounds wet.   Past Medical History  Diagnosis Date  . CAD (coronary artery disease)     stent to RCA  . CVA (cerebral infarction) 2003    no apparent residual  . Renal failure     related to hypertension  . Hypothyroidism   . PVD (peripheral vascular disease)   . Hyperlipidemia   . Positive PPD     completed rifampin  . Diastolic congestive heart failure   . Cancer     clear cell cancer  . Renal insufficiency   . Hypertension   . COPD (chronic obstructive pulmonary disease) t    Past Surgical History  Procedure Date  . Abdominal hysterectomy   . Cholecystectomy   . D&cs   . Right ankle repair   . Left nephrectomy     Family History  Problem Relation Age of Onset  . Heart disease Father   . Hypertension Mother   . Dementia Mother   . Coronary artery disease Sister     History  Substance Use Topics  . Smoking status: Current Everyday Smoker -- 0.5  packs/day  . Smokeless tobacco: Not on file   Comment: 1/2 ppd since age 73  . Alcohol Use: No    Review of Systems  Constitutional: Negative for fever.  HENT: Negative for congestion.   Eyes: Negative for visual disturbance.  Respiratory: Negative for shortness of breath.   Cardiovascular: Negative for chest pain.  Gastrointestinal: Positive for nausea. Negative for vomiting.  Musculoskeletal: Negative for back pain.  Skin: Negative for rash and wound.  Neurological: Negative for dizziness and weakness.  Psychiatric/Behavioral: Positive for confusion.    Allergies  Iohexol; Ampicillin; Meperidine hcl; Morphine; Penicillins; Codeine; Heparin; Pentazocine lactate; Phenytoin; and Ace inhibitors  Home Medications   Current Outpatient Rx  Name Route Sig Dispense Refill  . ACYCLOVIR 800 MG PO TABS Oral Take 1 tablet by mouth Daily.    [name] 18-103 MCG/ACT IN AERO Inhalation Inhale 2 puffs into the lungs every 6 (six) hours as needed. For breathing    . AMLODIPINE BESYLATE 10 MG PO TABS Oral Take 10 mg by mouth daily.     . ASPIRIN EC 81 MG PO TBEC Oral Take 81 mg by mouth daily.      [name] CARVEDILOL 25 MG PO TABS Oral  Take 25 mg by mouth 2 (two) times daily with a meal.     . CITALOPRAM HYDROBROMIDE 20 MG PO TABS Oral Take 20 mg by mouth daily.     [name] DEXILANT 60 MG PO CPDR Oral Take 60 mg by mouth daily.     [name] FLUTICASONE-SALMETEROL 100-50 MCG/DOSE IN AEPB Inhalation Inhale 1 puff into the lungs every 12 (twelve) hours.    [name] HYDRALAZINE HCL 25 MG PO TABS Oral Take 25 mg by mouth 2 (two) times daily.     [name] HYDROCODONE-ACETAMINOPHEN 5-500 MG PO TABS Oral Take 1 tablet by mouth every 6 (six) hours as needed. For pain.     [name] LEVOTHYROXINE SODIUM 88 MCG PO TABS Oral Take 88 mcg by mouth daily.      [name] LORAZEPAM 2 MG PO TABS Oral Take 2 mg by mouth every 6 (six) hours as needed. For anxiety    . RENA-VITE PO TABS Oral Take 1 tablet by mouth daily.      [name] NITROSTAT 0.4 MG SL  SUBL Sublingual Place 0.4 mg under the tongue every 5 (five) minutes as needed. For chest pain.    . OXYCODONE-ACETAMINOPHEN 5-325 MG PO TABS Oral Take 1 tablet by mouth every 6 (six) hours as needed. For pain    . POLYETHYLENE GLYCOL 3350 PO POWD Oral Take 17 g by mouth daily as needed. For constipation.    [name] PRAVASTATIN SODIUM 40 MG PO TABS Oral Take 80 mg by mouth at bedtime.      [name] PROMETHAZINE-CODEINE 6.25-10 MG/5ML PO SYRP Oral Take 5 mLs by mouth every 6 (six) hours as needed. For cough    . SEVELAMER CARBONATE 800 MG PO TABS Oral Take 1,600 mg by mouth 3 (three) times daily with meals.        BP 242/103  Pulse 80  Temp(Src) 98.3 F (36.8 C) (Oral)  Resp 12  SpO2 95%  Physical Exam  Constitutional: She appears well-developed and well-nourished.  HENT:  Head: Normocephalic.       Oral mucosa appears dry, poor dentition.   Eyes: Pupils are equal, round, and reactive to light. Scleral icterus is present.  Neck: Normal range of motion. Neck supple.  Cardiovascular: Normal rate and regular rhythm.   Pulmonary/Chest: Effort normal. She has rales.  Abdominal: Soft. Bowel sounds are normal. There is no tenderness.  Musculoskeletal: She exhibits no edema.  Neurological: She is alert.       Patient oriented to person, place, knows it is February but is unsure of the year.  No CN deficits or focal neuro deficits noted.   Skin: Skin is dry. No rash noted.    ED Course  Procedures (including critical care time)  Date: 02/17/2011  Rate: 80  Rhythm: normal sinus rhythm  QRS Axis: left  Intervals: normal  ST/T Wave abnormalities: nonspecific ST changes  Conduction Disutrbances:left anterior fascicular block  Narrative Interpretation: NSR, evidence of old infarction, no acute changes.   Old EKG Reviewed: unchanged   Labs Reviewed  CBC - Abnormal; Notable for the following:    RBC 5.14 (*)    MCH 25.1 (*)    RDW 19.6 (*)    Platelets 113 (*) PLATELET COUNT CONFIRMED BY SMEAR    All other components within normal limits  COMPREHENSIVE METABOLIC PANEL - Abnormal; Notable for the following:    Glucose, Bld 119 (*)    BUN 27 (*)    Creatinine, Ser 7.42 (*)    Calcium 10.9 (*)  Albumin 3.3 (*)    GFR calc non Af Amer 5 (*)    GFR calc Af Amer 6 (*)    All other components within normal limits  PHOSPHORUS - Abnormal; Notable for the following:    Phosphorus 5.4 (*)    All other components within normal limits  PROTIME-INR  MAGNESIUM   Dg Chest Portable 1 View  02/17/2011  *RADIOLOGY REPORT*  Clinical Data: Headaches, confusion  PORTABLE CHEST - 1 VIEW  Comparison: Chest x-ray of 09/22/2010  Findings: There is vague opacity at both lung bases suspicious for pneumonia.  Cardiomegaly is stable.  No effusion is seen.  No acute bony abnormality is noted.  IMPRESSION: Vague opacities at the lung bases medially.  Pneumonia cannot be excluded.  Stable cardiomegaly.  Original Report Authenticated By: Juline Patch, M.D.      MDM  Patient with agitation in ED.  BP has varied from 140/90-242/100.  Improved with hydralazine, nitro SL, lopressor, ativan.  Pt hypoxic, requiring 3 L [place], CXR concerning for fluid overload.  Will admit to Internal Medicine. Nephrology has also been consulted.         Ardyth Gal, MD 02/17/11 [phone]

## 2011-02-17 NOTE — ED Notes (Signed)
Pt reporting can't stick either arm due to graft placement. Pt has hx of PVD. IV team paged. EDP and resident aware.

## 2011-02-17 NOTE — Progress Notes (Addendum)
Rounded on patient at 2100 pm Patient is currently in dialysis. Nurse reports that she was confused and agitated earlier, and calmed down following Ativan IV ordered by Dr. Lowell Guitar and Dr. Arlean Hopping. The plan is for her to finish her HD and transfer her to 3300 for close monitoring. I have spoken the nurses in HD and 3300, and we will coordinate her care for her to have head CT done tonight.  Patient noted to be febrile at 101.3 and blood cultures obtained in HD. Patient is already on Vanc and Cefepime. I will follow up on her head CT.

## 2011-02-17 NOTE — ED Notes (Signed)
Pt sleeping/resting.

## 2011-02-17 NOTE — Progress Notes (Signed)
Hemodialysis-See flowsheet (late entry) Pt brought to unit in bed. Confused and increasing agitated. Consent obtained from daughter via telephone. Unable to initiate HD d/t agitation and confusion. Dr. Lowell Guitar notified. Order to apply bilateral soft wrist restraints and medicate with Ativan. Use of restraints discussed with pt family via telephone. Pt seen by Dr. Arlean Hopping. Order to give an extra 0.5mg  Ativan. Temp on arrival =101.3. Blood cultures drawn and sent. Continue to monitor

## 2011-02-17 NOTE — ED Notes (Signed)
PO ativan not given due to pt being lethargic

## 2011-02-17 NOTE — ED Notes (Signed)
During swallow evaluation pt not alert enough to follow commands. Pt appearing to still be feeling the side effects of IV Ativan.

## 2011-02-17 NOTE — ED Notes (Signed)
Per ems- c/o headache that started yesterday. Reporting "feeling unwell". C/o nausea, denying any vomiting. Pt has weakness on left hand from previous CVA. Pt BP 170 palpated. CBG 98. No new neuro deficits noted. Denying any blurry vision. Pt was supposed to go to dialysis today, not feeling well enough to go this morning. Pt last dialyzed Wednesday. Pt was supposed to go to MRI yesterday for a mass on her existing kidney. Pt did not end up getting the MRI due to not feeling well. Pt family member reports pt had some confusion yesterday but today her complaint is headache and nausea.

## 2011-02-17 NOTE — H&P (Signed)
Hospital Admission Note Date: 02/17/2011  Patient name: [name] Medical record number: [id number] Date of birth: 17-Jul-1945 Age: 66 y.o. Gender: female PCP: [name], MD, MD  Medical Service: Internal Medicine  Attending physician: Judyann Munson     1st Contact: Wylene Men, MS4  Pager: [phone] 2nd Contact: Dr. Lars Mage   Pager: After 5 pm or weekends: 1st Contact:      Pager: [phone] 2nd Contact:      Pager: [phone]  Chief Complaint: Acute Mental Status Change  History of Present Illness: [name] is a 66 year old lady with a PMH significant for CVA (2003), CAD, ESRD on mwf dialysis, PVD, Hypothyroidism, lyperlipidemia, hypertension who presents with acute mental status change. All information was gathered by the patient's daugher ([name]). Last night Mrs. Dumond was at her baseline mental status, which means she is mildly forgetful but able to perform executive functions. This morning she was found disheveled by a neighbor, when she was scheduled to attend a dialysis appointment. The patient was incoherent with a short attention span. In the ED her blood pressure was 242103, which was corrected to 16862 after 10mg  iv of Hydralazine. She was also given 1mg  of Ativan for agitation.   The patient has a history of a stroke in 2003, which predominantly affected the left side of her body. The patient is left handed. The patient recovered from this stroke with little residual effects. The patient has a history of CAD status post stenting of her RCA "many years ago". The patient has not complained of an increase in chest pain or shortness of breath lately, though she does occasionally use her nitroglycerine tablets for angina. The patient does not have a history of syncope. Patient has a history of heart failure, and has been losing weight lately. The patient has a history of COPD, but she rarely has to use her Albuterol/Ipratropium inhaler. The patient has a long history of  hypertension. At home, she regularly has SBPs of 160-170. The daughter believes the patient has taken lots of Ativan, Benadryl, and Percocet lately due to pain and discomfort with her left arm dialysis graft. Patient has not been ill lately, and does not have any sick contacts. The patient has had a mildly productive cough, but is without fever, chills, malaise, or shortness of breath. Patient has ESRD due to hypertension on MWF hemodialysis. She still produces urine, but has not complained of any dysuria. Patient has a history of falling out of her bed, but not blood or trauma were noted. Patient has complained of intermittent constipation over the past several weeks.   Meds: Medications Prior to Admission  Medication Dose Route Frequency Provider Last Rate Last Dose  . hydrALAZINE (APRESOLINE) injection 10 mg  10 mg Intravenous Once Ardyth Gal, MD   10 mg at 02/17/11 0959  . LORazepam (ATIVAN) injection 1 mg  1 mg Intravenous Once Ardyth Gal, MD   1 mg at 02/17/11 1012  . LORazepam (ATIVAN) tablet 1 mg  1 mg Oral Once Ardyth Gal, MD      . metoprolol (LOPRESSOR) injection 5 mg  5 mg Intravenous Once Ardyth Gal, MD   5 mg at 02/17/11 1113  . nitroGLYCERIN (NITROSTAT) SL tablet 0.3 mg  0.3 mg Sublingual Once Ardyth Gal, MD   0.3 mg at 02/17/11 0831  . ondansetron (ZOFRAN-ODT) disintegrating tablet 4 mg  4 mg Oral Once Ardyth Gal, MD   4 mg at 02/17/11 0801  . DISCONTD: hydrALAZINE (APRESOLINE) tablet  25 mg  25 mg Oral Once Ardyth Gal, MD       Medications Prior to Admission  Medication Sig Dispense Refill  . acyclovir (ZOVIRAX) 800 MG tablet Take 1 tablet by mouth Daily.      [name] albuterol-ipratropium (COMBIVENT) 18-103 MCG/ACT inhaler Inhale 2 puffs into the lungs every 6 (six) hours as needed. For breathing      . amLODipine (NORVASC) 10 MG tablet Take 10 mg by mouth daily.       [name] aspirin EC 81 MG tablet Take 81 mg by mouth daily.        .  carvedilol (COREG) 25 MG tablet Take 25 mg by mouth 2 (two) times daily with a meal.       . citalopram (CELEXA) 20 MG tablet Take 20 mg by mouth daily.       [name] DEXILANT 60 MG capsule Take 60 mg by mouth daily.       . Fluticasone-Salmeterol (ADVAIR) 100-50 MCG/DOSE AEPB Inhale 1 puff into the lungs every 12 (twelve) hours.      . hydrALAZINE (APRESOLINE) 25 MG tablet Take 25 mg by mouth 2 (two) times daily.       [name] HYDROcodone-acetaminophen (VICODIN) 5-500 MG per tablet Take 1 tablet by mouth every 6 (six) hours as needed. For pain.       [name] levothyroxine (SYNTHROID, LEVOTHROID) 88 MCG tablet Take 88 mcg by mouth daily.        [name] LORazepam (ATIVAN) 2 MG tablet Take 2 mg by mouth every 6 (six) hours as needed. For anxiety      . multivitamin (RENA-VIT) TABS tablet Take 1 tablet by mouth daily.        [name] NITROSTAT 0.4 MG SL tablet Place 0.4 mg under the tongue every 5 (five) minutes as needed. For chest pain.      [name] oxyCODONE-acetaminophen (PERCOCET) 5-325 MG per tablet Take 1 tablet by mouth every 6 (six) hours as needed. For pain      . polyethylene glycol powder (GLYCOLAX/MIRALAX) powder Take 17 g by mouth daily as needed. For constipation.      . pravastatin (PRAVACHOL) 40 MG tablet Take 80 mg by mouth at bedtime.        . promethazine-codeine (PHENERGAN WITH CODEINE) 6.25-10 MG/5ML syrup Take 5 mLs by mouth every 6 (six) hours as needed. For cough      . sevelamer (RENVELA) 800 MG tablet Take 1,600 mg by mouth 3 (three) times daily with meals.          Allergies: Iohexol; Ampicillin; Meperidine hcl; Morphine; Penicillins; Heparin; Pentazocine lactate; Phenytoin; and Ace inhibitors Past Medical History  Diagnosis Date  . CAD (coronary artery disease)     stent to RCA  . CVA (cerebral infarction) 2003    no apparent residual  . Renal failure     related to hypertension  . Hypothyroidism   . PVD (peripheral vascular disease)   . Hyperlipidemia   . Positive PPD     completed rifampin  .  Diastolic congestive heart failure   . Renal insufficiency   . Hypertension   . COPD (chronic obstructive pulmonary disease) t  . Cancer     clear cell cancer, kidney  . Complication of anesthesia 12/2010    pt is very confused, with AMS with anesthesia   Past Surgical History  Procedure Date  . Abdominal hysterectomy   . Cholecystectomy   . D&cs   . Right ankle repair   .  Left nephrectomy   . Vascular surgery 11/2010    graft inserted to left arm   Family History  Problem Relation Age of Onset  . Heart disease Father   . Hypertension Mother   . Dementia Mother   . Coronary artery disease Sister    History   Social History  . Marital Status: Legally Separated    Spouse Name: N/A    Number of Children: 2  . Years of Education: N/A   Occupational History  . Retired Engineer, civil (consulting)    Social History Main Topics  . Smoking status: Current Everyday Smoker -- 2.0 packs/day for 53 years    Types: Cigarettes  . Smokeless tobacco: Not on file   Comment: 1/2 ppd since age 86  . Alcohol Use: No  . Drug Use: No  . Sexually Active: Not Currently    Birth Control/ Protection: Post-menopausal   Other Topics Concern  . Not on file   Social History Narrative  . No narrative on file    Review of Systems: Pertinent items are noted in HPI.  Physical Exam: Blood pressure 168/62, pulse 89, temperature 98.3 F (36.8 C), temperature source Oral, resp. rate 15, SpO2 94.00%. Head: Normocephalic, Atraumatic. Eyes: Pupils equally round and reactive to light bilaterally. Sclera non-injected and anicteric.  Mouth: Normal dentition. Moist mucus membranes. Neck: At 45 degrees JVD only observed with hepatic palpation. Trachea midline. No cervical lymphadenopathy. No thyroidmegaly.  Pulmonary: Normal work of breathing. Breath sounds present bilaterally. Mild rhonchii on left. No wheezing or crackles. Cardiac: Regular rate and rhythm. No murmurs, rubs, or gallops. Normal S1 and S2.  GI: Bowel  sounds present. Non-tender to palpation. No hepatosplenomegaly.  Well healed surgical scars.  Extremities: 1+ left radial pulse. 2+ right radial pulse. 2+ pedal pulses bilaterally. Left arm is cool to touch. No clubbing.  Neuro: Minimal participation to exam. Ignores everything to her left. 5/5 grip on right. 3/5 grip on left. 5/5 ankle extension on right. 3/5 ankle extension on left. Reacts to painful stimuli bilaterally. Toes down and in R>L. 2+ patellar reflexes bilaterally.  Psych: Can give full name. Does not know time, date, location, president, or reason for hospitalization.  Lab results: Na 137, K 4.6, Cl 97, CO2 25, BUN 27, Cr 7.42, Glucose 119, Ca 10.9, Mg 2.0, Phosphorus 5.4 Alk Phos 78, Albumin 3.3, AST 15, ALT 8, Total Protein 6.8, Tbili 0.5 WBC 9.3, HGB 12.9, HCT 42.3, PLTs 113,  INR 1.00  Imaging results:  Dg Chest Portable 1 View (02/17/11) IMPRESSION: Vague opacities at the lung bases medially.  Pneumonia cannot be excluded.  Stable cardiomegaly.    Other results: EKG: normal EKG, normal sinus rhythm, unchanged from previous tracings.  Assessment & Plan by Problem:  1. Altered Mental Status: Patient presents with a 1 day history of acute mental status change. A differential diagnosis includes: ischemic stroke, hemorrhagic stroke, subdural hematoma, epidural hematoma, medication, hypertensive encephalopathy, pneumonia, or thyroid dysfunction. Patient is potentially having an ischemic stroke because she has left hemineglect, and appears to have left sided muscle weakness. However, the patient is altered and does not participate in the neurologic exam. Given the patient's history of HTN she is also at risk of hemorrhagic stroke. Given the patient's history of falling she is at risk of subdural or epidural hematoma. Patient has been noted to be taking lorazepam, benadryl, and percocet lately; all which could affect her mental status. Given the patient's high blood pressure on admission  she could be  suffering from hypertensive encephalopathy. Patient has been noted to have productive cough and CXR cannot rule out pneumonia. However, infection is less likely because patient is afebrile with a normal white blood count.  -- MRIMRA of the head and neck -- Urineanalysis/Urine Culture -- Urine drug screen -- Check B12 and Folate  2. Hypertension: Patient's blood pressure on admission to the ED was very high at 242/103. It is unclear if she has taken her outpatient hypertensive medications. Patient was given Hydralazine 10mg  iv, with follow-up blood pressure of 168/62. Patient's home systolic blood pressure is 160-170s. Will continue to monitor. Home blood pressure medications include: Amlodipine 10mg  po daily, Carvedilol 25mg  po BID, and Hydralazine 25mg  po BID.  -- Will restart home medications as tolerated, or convert to iv blood pressure control.  3. End Stage Renal Disease: Patient's has ESRD with MWF hemodialysis. Her last dialysis was last Wednesday (2 days ago). Patient has elevated Ca and PO4, consistent with her renal dysfunction. Patient does not appear to be volume overloaded. BUN is 27 and unlikely to be contributing to her current symptoms. Followed by Dr. Kathrene Bongo. -- Consult nephrology -- Initiate inpatient hemodialysis.   4. Coronary Artery Disease: Patient has a history of CVA in 2003, CAD with a stent to her RCA, and leg PVD. Given the patient's prior history, family history, 106 pack year history of smoking, ESRD, and uncontrolled hypertension; she is at high risk for further vascular insults. Her most recent lipid panel (11/29/10) was total cholesterol 145, LDL 80, TG 128, HDL 40. Patient takes Pravastatin 80mg  daily and ASA 81mg  daily. EKG on admission does not show any signs of ischemia. Patient is followed by Dr. Antoine Poche.  5. Chronic Diastolic Heart Failure: last catheterization on 02/02/09 shows an ejection fraction of 55%. She appears euvolemic today. She has  minimal JVD, and no lower extremity edema. She does not have pulmonary crackles on auscultation, and has not been gaining weight lately. She is currently taking Carvedilol 25mg  po bid. The patient has documented rashes to ACE inhibitors. She is not currently on and ARB.  6. CVA history: Patient has a history of stroke in 2003 that was characterized by left sided weakness. The weakness has completely resolved. Patient's physical exam is worrisome for CVA today which will be evaluated by MRI.   7. Hypothyroidism: Patient has a history of hypthyroidism treated with Synthroid daily. Her last TSH was 1.10 on 11/29/10.  -- TSH pending  Signed: Wilmer Floor 02/17/2011, 1:54 PM    Addendum:  Patient was seen examined and evaluated with MS4 Wylene Men.  Ms. Taflinger is 66 yo women with PMH of ESRD on HD MWF, diastolic CHF, uncontrolled HTN, CAD and previous stroke comes in today with altered mental status. She was due for HD today morning and when her daughter came to get her, she was found in her neighbour's house and was profoundly confused. No fever, chills, shortness of breath, chest pain, difficulty urination or change in bowel or bladder habits. Although she is ESRD, she still makes urine.  14 point ROS was negative except noted above. The history is somewhat limited due to Level 5 caveat and most of the history was obtained from her daughter.  BP 166/115  Pulse 91  Temp(Src) 98.5 F (36.9 C) (Oral)  Resp 19  Ht 5\' 2"  (1.575 m)  Wt 165 lb 9.1 oz (75.1 kg)  BMI 30.28 kg/m2  SpO2 94%  BP 166/115  Pulse 91  Temp(Src) 98.5 F (  36.9 C) (Oral)  Resp 19  Ht 5\' 2"  (1.575 m)  Wt 165 lb 9.1 oz (75.1 kg)  BMI 30.28 kg/m2  SpO2 94%  General Appearance:    Oriented to hospital only, fluent speech which did not make much sense  Head:    Normocephalic, without obvious abnormality, atraumatic  Eyes:    PERRL, conjunctiva/corneas clear, EOM's intact, fundi    benign, both eyes    Ears:    Normal TM's and external ear canals, both ears  Nose:   Nares normal, septum midline, mucosa normal, no drainage    or sinus tenderness  Throat:   Lips, mucosa, and tongue normal; teeth and gums normal  Neck:   Supple, symmetrical, trachea midline, no adenopathy;    thyroid:  no enlargement/tenderness/nodules; no carotid   bruit or JVD  Back:     Symmetric, no curvature, ROM normal, no CVA tenderness  Lungs:     Some rochi on the left lower base, respirations unlabored  Chest Wall:    No tenderness or deformity   Heart:    Regular rate and rhythm, S1 and S2 normal, 2/6 ES murmur left sternal border,  No rub   or gallop  Breast Exam:    No tenderness, masses, or nipple abnormality  Abdomen:     Soft, non-tender, bowel sounds active all four quadrants,    no masses, no organomegaly        Extremities:   Extremities normal, atraumatic, no cyanosis or edema  Pulses:   2+ and symmetric all extremities  Skin:   Skin color, texture, turgor normal, no rashes or lesions  Lymph nodes:   Cervical, supraclavicular, and axillary nodes normal  Neurologic:   Patient was non cooperative during the exam.    Labs and imaging studies from ED were reviewed and no significant abnormalities found except noted in A and P.   Assessment and plan: 1. Altered mental status: Most likely new stroke 2. ESRD 3. CAD 4. HTN 5. Chronic diastolic heart failure 6. Hypothyroidism  Plan: Patient will be admitted to telemetry bed. MRI/MRA head and neck for new stroke vs bleed. Encephalitis is remote possibility with acute change in mental status but given given no fevers and headache, the probability is very low. We will consult renal team for dialysis inpatient. Check tsh, vitamin b12 and folate to rule out severe deficiency leading change in menatl status. We will allow permissive HTN for now.  Lars Mage MD [facility] Internal Medicine Resident Pager [phone] 4:14 PM

## 2011-02-17 NOTE — ED Notes (Signed)
Pt BP 200/113, resident ordering BP medicine. Called pharmacy to have them tube it to me.

## 2011-02-17 NOTE — ED Notes (Signed)
BP taken in lower forearm. Can Not take BP in upper arm on both arms.

## 2011-02-17 NOTE — ED Notes (Addendum)
Iv team at bedside. Trying to get IV via ultrasound. At this time pt hard to reason with, family assisting to reorient patient. Resident at bedside aware of pt condition. Pt hypertensive, pt given 1 SL nitro per resident. Pt coughing sounding congested.

## 2011-02-17 NOTE — Progress Notes (Signed)
Pt off unit

## 2011-02-17 NOTE — Progress Notes (Signed)
ANTIBIOTIC CONSULT NOTE - INITIAL  Pharmacy Consult for Vancomycin/Cefepime Indication: rule out pneumonia  Allergies  Allergen Reactions  . Iohexol Swelling and Other (See Comments)    1970s; passed out and had facial/tongue swelling.  Requires 13-hour prep with prednisone and benadryl  . Ampicillin Hives and Rash  . Meperidine Hcl Swelling and Rash    Makes tongue swell  . Morphine Rash  . Penicillins Rash  . Heparin     MDs told her not to take after reaction in ICU  . Pentazocine Lactate     Patient does not remember reaction to this med ([place]win).   . Phenytoin Other (See Comments)    Had reaction while in ICU; doesn't know.  MDs told her not to take ever again.  . Ace Inhibitors Rash    Patient Measurements: Height: 5\' 2"  (157.5 cm) Weight: 156 lb 4.9 oz (70.9 kg) IBW/kg (Calculated) : 50.1   Vital Signs: Temp: 101.3 F (38.5 C) (02/15 1805) Temp src: Oral (02/15 1805) BP: 117/78 mmHg (02/15 1805) Pulse Rate: 90  (02/15 1805) Intake/Output from previous day:   Intake/Output from this shift:    Labs:  Basename 02/17/11 0801  WBC 9.3  HGB 12.9  PLT 113*  LABCREA --  CREATININE 7.42*   Estimated Creatinine Clearance: 6.9 ml/min (by C-G formula based on Cr of 7.42).   Microbiology: No results found for this or any previous visit (from the past 720 hour(s)).  Medical History: Past Medical History  Diagnosis Date  . CAD (coronary artery disease)     stent to RCA  . CVA (cerebral infarction) 2003    no apparent residual  . Renal failure     related to hypertension  . Hypothyroidism   . PVD (peripheral vascular disease)   . Hyperlipidemia   . Positive PPD     completed rifampin  . Diastolic congestive heart failure   . Renal insufficiency   . Hypertension   . COPD (chronic obstructive pulmonary disease) t  . Cancer     clear cell cancer, kidney  . Complication of anesthesia 12/2010    pt is very confused, with AMS with anesthesia     Medications:  See med rec  Assessment: 66 yo F admitted with AMS, now found to have possible pneumonia and to begin vancomycin and Cefepime empirically.  Patient has ESRD and usual schedule is MWF, last dialysis session was Wednesday.  Patient in dialysis now.    Goal of Therapy:  Target pre-HD level 15-25 mcg/ml Target post-HD level 5-15 mcg/ml  Plan:  1.  Vancomycin 1500 mg IV x 1 then 750 mg IV post each dialysis 2.  Cefepime 1 gm IV q 24hr 3.  F/U any cultures, vancomycin level at steady state  Rolland Porter, [place].D., BCPS Clinical Pharmacist Pager: [phone]

## 2011-02-17 NOTE — Progress Notes (Signed)
02/17/11   1429 note pt is to confused and lethargic  to take her meds by mouth at this time  [address] RN,BSN
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
## 2011-02-17 NOTE — ED Notes (Signed)
Resident aware of pt BP

## 2011-02-17 NOTE — ED Notes (Signed)
Pt appearing confused, difficult to reason with. Uncooperative. Pt repeatedly trying to get out of bed. Family trying to reason with patient. Resident aware of pt mental status. Pt hypertensive. Resident aware

## 2011-02-17 NOTE — ED Notes (Signed)
Iv team unable to get IV. Dr. Juleen China at bedside attempt an EJ.

## 2011-02-17 NOTE — ED Notes (Signed)
RN hanging blood at this time. Unable to take report. Reporting I will call back in 10-15 mins

## 2011-02-17 NOTE — ED Notes (Signed)
IV team paged, on the way down

## 2011-02-18 ENCOUNTER — Inpatient Hospital Stay (HOSPITAL_COMMUNITY): Payer: Medicare Other

## 2011-02-18 DIAGNOSIS — I509 Heart failure, unspecified: Secondary | ICD-10-CM

## 2011-02-18 DIAGNOSIS — I5032 Chronic diastolic (congestive) heart failure: Secondary | ICD-10-CM

## 2011-02-18 DIAGNOSIS — I10 Essential (primary) hypertension: Secondary | ICD-10-CM

## 2011-02-18 DIAGNOSIS — N186 End stage renal disease: Secondary | ICD-10-CM

## 2011-02-18 LAB — URINALYSIS, ROUTINE W REFLEX MICROSCOPIC
Ketones, ur: NEGATIVE mg/dL
Leukocytes, UA: NEGATIVE
Nitrite: NEGATIVE
pH: 8.5 — ABNORMAL HIGH (ref 5.0–8.0)

## 2011-02-18 LAB — RENAL FUNCTION PANEL
BUN: 19 mg/dL (ref 6–23)
Calcium: 10.8 mg/dL — ABNORMAL HIGH (ref 8.4–10.5)
GFR calc Af Amer: 7 mL/min — ABNORMAL LOW (ref >90)
Glucose, Bld: 89 mg/dL (ref 70–99)
Phosphorus: 5.9 mg/dL — ABNORMAL HIGH (ref 2.3–4.6)
Sodium: 139 mEq/L (ref 135–145)

## 2011-02-18 LAB — RAPID URINE DRUG SCREEN, HOSP PERFORMED
Amphetamines: NOT DETECTED
Cocaine: NOT DETECTED
Opiates: POSITIVE — AB
Tetrahydrocannabinol: NOT DETECTED

## 2011-02-18 LAB — GLUCOSE, CAPILLARY: Glucose-Capillary: 95 mg/dL (ref 70–99)

## 2011-02-18 LAB — VITAMIN B12: Vitamin B-12: 1001 pg/mL — ABNORMAL HIGH (ref 211–911)

## 2011-02-18 LAB — URINE MICROSCOPIC-ADD ON

## 2011-02-18 MED ORDER — DEXTROSE 5 % IV SOLN: 10.0000 mg/kg | INTRAVENOUS | Status: DC

## 2011-02-18 MED ORDER — GENTAMICIN SULFATE 40 MG/ML IJ SOLN
180.0000 mg | Freq: Once | INTRAVENOUS | Status: DC
  Filled 2011-02-18: qty 4.5

## 2011-02-18 MED ORDER — GENTAMICIN SULFATE 40 MG/ML IJ SOLN: 140.0000 mg | INTRAVENOUS | Status: DC

## 2011-02-18 NOTE — H&P (Signed)
Internal Medicine Teaching Service Attending Note Date: 02/18/2011  Patient name: [name]  Medical record number: [id number]  Date of birth: 1945-02-25   I have seen and evaluated [name] and discussed their care with the Residency Team.  This is a 66 yo Female with history of CAD, stroke, s/p nephrectomy due to renal mass, and CKD 2/2 hypertension on HD. She presents with 1-2 day of altered mental status. On admit, she was found to have hypertensive emergency with BP of 242/103 and AMS, possibly due to hypertension. Her blood pressure was controlled with doses of labetolol and hydralazine, in addition to having 3L removed in HD last night. Other causes of encephalopathy was also worked up, including infection. NCHCT showed no evidence of bleed or hydrocephaly. She was started on vancomycin and cefepime for presumable pneumonia due to mildly elevated WBC and cxr suggestive of RLL infiltrate. Overnight, she required numerous doses of ativan for agitation. She is more alert on exam but needs frequent prompting to follow commands. She does not appear to have any left sided neglect nor focal weakness on observation/exam. She is still not completely appropriate this morning. One of her daughters has mentioned that her mother may be using her medications incorrectly since a few weekends ago, she was found to be excessively sleepy for a period of 2 days.  Physical Exam: Blood pressure 114/67, pulse 94, temperature 98.5 F (36.9 C), temperature source Oral, resp. rate 24, height 5\' 2"  (1.575 m), weight 148 lb 11.2 oz (67.45 kg), SpO2 99.00%. Gen= dishelved, unkept, a x o by 1, older than stated age, not following commands consistently, repeats "  i want to get up" Head: Normocephalic, Atraumatic.  Eyes: Pupils equally round and reactive to light bilaterally. Sclera non-injected and anicteric.  Mouth: dry oral mucosa, tongue and uvula are midline Neck:  Trachea midline. No cervical lymphadenopathy.  No thyroidmegaly.  Pulmonary: Normal work of breathing.Mild rhonchii on right ant lung fields and clearer on left No wheezing or crackles.  Cardiac: Regular rate and rhythm. No murmurs, rubs, or gallops. Normal S1 and S2.  GI: Bowel sounds present. Non-tender to palpation. No hepatosplenomegaly. Well healed surgical scars.  Extremities: 1+ left radial pulse. 2+ right radial pulse. 2+ pedal pulses bilaterally. Left arm is cool to touch. No clubbing.  Neuro: inconsistent participation to exam.  5/5 grip on right. 3/5 grip on left. 5/5 ankle extension on right. 3/5 ankle extension on left. Reacts to painful stimuli bilaterally. Toes down and in R>L. 2+ patellar reflexes bilaterally. Although she appears to have equal strength in upper extremities as she holds herself up with side rails, sitting up in bed.   Lab results: Results for orders placed during the hospital encounter of 02/17/11 (from the past 24 hour(s))  TSH     Status: Normal   Collection Time   02/17/11  8:52 PM      Component Value Range   TSH 1.536  0.350 - 4.500 (uIU/mL)  VITAMIN B12     Status: Abnormal   Collection Time   02/17/11  8:52 PM      Component Value Range   Vitamin B-12 1001 (*) 211 - 911 (pg/mL)  GLUCOSE, CAPILLARY     Status: Normal   Collection Time   02/17/11 10:47 PM      Component Value Range   Glucose-Capillary 95  70 - 99 (mg/dL)   Comment 1 Notify RN     Comment 2 Documented in Chart  RENAL FUNCTION PANEL     Status: Abnormal   Collection Time   02/18/11  8:25 AM      Component Value Range   Sodium 139  135 - 145 (mEq/L)   Potassium 4.8  3.5 - 5.1 (mEq/L)   Chloride 99  96 - 112 (mEq/L)   CO2 25  19 - 32 (mEq/L)   Glucose, Bld 89  70 - 99 (mg/dL)   BUN 19  6 - 23 (mg/dL)   Creatinine, Ser 1.61 (*) 0.50 - 1.10 (mg/dL)   Calcium 09.6 (*) 8.4 - 10.5 (mg/dL)   Phosphorus 5.9 (*) 2.3 - 4.6 (mg/dL)   Albumin 2.9 (*) 3.5 - 5.2 (g/dL)   GFR calc non Af Amer 6 (*) >90 (mL/min)   GFR calc Af Amer 7 (*)  >90 (mL/min)    Imaging results:  Dg Chest 1 View  02/17/2011  *RADIOLOGY REPORT*  Clinical Data: Altered mental status  CHEST - 1 VIEW  Comparison: Exam at 1533 hours compared to 02/17/2011 at 1010 hours  Findings: Rotated to the left. Enlargement of cardiac silhouette with pulmonary vascular congestion. Bibasilar consolidation. Atherosclerotic calcification aortic arch. No gross pleural effusion or pneumothorax. Bones demineralized.  IMPRESSION: Enlargement of cardiac silhouette with pulmonary vascular congestion. Bibasilar consolidation.  Original Report Authenticated By: Lollie Marrow, M.D.   Ct Head Wo Contrast  02/18/2011  *RADIOLOGY REPORT*  Clinical Data: Altered mental status.  CT HEAD WITHOUT CONTRAST  Technique:  Contiguous axial images were obtained from the base of the skull through the vertex without contrast.  Comparison: 10/10/2009  Findings: There is stable low density and encephalomalacia involving the left frontal lobe and left parietal lobe. Again noted are low density lacunar infarcts throughout the cerebellum.  The study is limited due to excessive patient motion despite repeat images.  No evidence for acute hemorrhage, midline shift or hydrocephalus.  No evidence for a new large infarct.  No acute bony abnormality.  IMPRESSION: Limited examination due to excessive patient motion.  No significant change from the prior examination.  Old ischemic changes in the left cerebral cortex and cerebellar hemispheres.  Original Report Authenticated By: Richarda Overlie, M.D.   Dg Chest Port 1 View  02/18/2011  *RADIOLOGY REPORT*  Clinical Data: Pulmonary infiltrates  PORTABLE CHEST - 1 VIEW  Comparison:   the previous day's study  Findings: There is been interval worsening of interstitial airspace opacities throughout the left lung.  Some improvement in aeration at the right lung base.  Some new mild atelectasis or infiltrate medially in the right upper lung.  Mild cardiomegaly stable. Cannot exclude  small left pleural effusion.  IMPRESSION:  1.  Overall worsening of asymmetric infiltrates or edema, left greater than right.  Original Report Authenticated By: Osa Craver, M.D.   Dg Chest Portable 1 View  02/17/2011  *RADIOLOGY REPORT*  Clinical Data: Headaches, confusion  PORTABLE CHEST - 1 VIEW  Comparison: Chest x-ray of 09/22/2010  Findings: There is vague opacity at both lung bases suspicious for pneumonia.  Cardiomegaly is stable.  No effusion is seen.  No acute bony abnormality is noted.  IMPRESSION: Vague opacities at the lung bases medially.  Pneumonia cannot be excluded.  Stable cardiomegaly.  Original Report Authenticated By: Juline Patch, M.D.    Assessment and Plan: I agree with the formulated Assessment and Plan with the following changes: will get chest CT since patient has increasing patchy airspace disease involving left lung fields unclear etiology, fast progression  for the night prior. Will continue with vancomycin and cefepime for hcap coverage. Will attempt to get sputum culture. It appears there was a delay in receiving cefepime last night. Have re-ordered for this morning. Patient does not appear to have neglect or weakness on the left, thus will not get MRI at this point in order to avoid excess sedation. Will track down records at dialysis center to see why she is on acyclovir 800mg  daily. Her mental status is waxing and waning, no physical exam findings suggestive of nuchal rigidity. If worsens, will get LP.  Duke Salvia Drue Second MD MPH [facility] [phone]

## 2011-02-18 NOTE — Progress Notes (Signed)
Nursing 0300 performed bladder scan which showed zero urine, unable to collect urine specimen at this time, will continue to monitor [name],  [name]
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
## 2011-02-18 NOTE — Progress Notes (Addendum)
Subjective: Patient received hemodialysis yesterday removing 3kg or 3.5litres. Patient remains somnolent and agitated most of the night. She was given 1mg  of ativan. No acute events. Patient is sitting up in her bed this morning. Patient was febrile up to 101.7, but has decreased to 98.9.  Objective: Vital signs in last 24 hours: Filed Vitals:   02/18/11 0345 02/18/11 0410 02/18/11 0634 02/18/11 0733  BP:  128/77 142/75   Pulse: 93 94    Temp:  100.1 F (37.8 C)  98.9 F (37.2 C)  TempSrc:  Oral  Oral  Resp: 23 24    Height:      Weight:  67.45 kg (148 lb 11.2 oz)    SpO2: 98% 99%      Physical Exam: Head: Normocephalic, Atraumatic.  Eyes: Pupils equally round and reactive to light bilaterally. Sclera non-injected and anicteric.  Mouth: Normal dentition. Moist mucus membranes.  Neck: Trachea midline. No cervical lymphadenopathy. No thyroidmegaly.  Pulmonary: Patient does not participate in exam. Breath sounds difficult to hear bilaterally. L>R dullness to percussion.  Cardiac: Regular rate and rhythm. No murmurs, rubs, or gallops. Normal S1 and S2.  GI: Bowel sounds present. Non-tender to palpation. No hepatosplenomegaly. Well healed surgical scars.  Extremities: 1+ left radial pulse. 2+ right radial pulse. 2+ pedal pulses bilaterally. Left arm is cool to touch. No clubbing. Patient's grafts are non-erythematous and non-tender.  Neuro: Moving all extremities. Does not follow commands. Reacts to pain stimuli in hands and feet bilaterally.  Psych: Difficult to arouse. Not orientated to person time or place.   Lab Results: Na 139, K 4.8, Cl 99, CO2 25, BUN 19, Cr 6.09, Glucose 89, Phos 5.9 TSH 1.536  Micro Results: Blood Cultures Pending Urine Cultures Pending  Studies/Results: Dg Chest Portable 1 View (02/17/2011) at 1034 IMPRESSION: Vague opacities at the lung bases medially.  Pneumonia cannot be excluded.  Stable cardiomegaly.   Dg Chest 1 View (02/17/2011) at  1533 IMPRESSION: Enlargement of cardiac silhouette with pulmonary vascular congestion. Bibasilar consolidation.    Dg Chest Port 1 View (02/18/1011)  IMPRESSION: Overall worsening of asymmetric infiltrates or edema, left greater than right.    Ct Head Wo Contrast (02/18/2011) IMPRESSION: Limited examination due to excessive patient motion.  No significant change from the prior examination.  Old ischemic changes in the left cerebral cortex and cerebellar hemispheres.  Medications: I have reviewed the patient's current medications. Scheduled Meds:   . albuterol-ipratropium  2 puff Inhalation Q6H  . ceFEPime (MAXIPIME) IV  1 g Intravenous Q24H  . Fluticasone-Salmeterol  1 puff Inhalation Q12H  . hydrALAZINE  10 mg Intravenous Q6H  . labetalol  10 mg Intravenous Q6H  . LORazepam  0.5 mg Intravenous Once  . LORazepam  1 mg Intravenous Once  . metoprolol  5 mg Intravenous Once  . pantoprazole (PROTONIX) IV  40 mg Intravenous QHS  . paricalcitol  6 mcg Intravenous Q M,W,F-HD  . sodium chloride  3 mL Intravenous Q12H  . sodium chloride      . vancomycin  1,500 mg Intravenous Once  . vancomycin  750 mg Intravenous Q M,W,F-HD  . DISCONTD: acyclovir  10 mg/kg (Ideal) Intravenous Q8H  . DISCONTD: acyclovir  10 mg/kg (Ideal) Intravenous Q24H  . DISCONTD: amLODipine  10 mg Oral Daily  . DISCONTD: aspirin EC  81 mg Oral Daily  . DISCONTD: carvedilol  25 mg Oral BID WC  . DISCONTD: ceFEPime (MAXIPIME) IV  1 g Intravenous To Hemo  . DISCONTD:  citalopram  20 mg Oral Daily  . DISCONTD: gentamicin  140 mg Intravenous Q M,W,F-HD  . DISCONTD: gentamicin  180 mg Intravenous Once  . DISCONTD: hydrALAZINE  25 mg Oral BID  . DISCONTD: levothyroxine  88 mcg Oral QAC breakfast  . DISCONTD: LORazepam  1 mg Oral Once  . DISCONTD: multivitamin  1 tablet Oral Daily  . DISCONTD: pantoprazole  40 mg Oral Q1200  . DISCONTD: pravastatin  40 mg Oral q1800  . DISCONTD: sevelamer  1,600 mg Oral TID WC  . DISCONTD:  sevelamer  1,600 mg Oral TID WC  . DISCONTD: simvastatin  40 mg Oral q1800  . DISCONTD: vancomycin  1,500 mg Intravenous To Hemo   Continuous Infusions:  PRN Meds:.haloperidol lactate, LORazepam, DISCONTD: LORazepam, DISCONTD: LORazepam, DISCONTD: nitroGLYCERIN, DISCONTD: oxyCODONE-acetaminophen, DISCONTD: polyethylene glycol, DISCONTD: polyethylene glycol powder, DISCONTD: promethazine-codeine Assessment/Plan: 1. Health care associated Pneumonia: Increased dullness over left lung, and CXR indicate left lung pneumonia. Patient became febrile overnight with a Tmax of 101.7. Last night she was started on Vancomycin and Cefepime. Cefepime however was not delivered. In the morning patient's temperature had decreased to 98.9 without taking any tylenol. On physical exam the patient is less agitated today, but still difficult to arouse. Will continue broad pneumonia coverage. Patient's chest x-ray shows increased opacity of the entire left lung, but improvement on the right. If patient pneumonia does not improve, will consider broadening coverage with Gentamycin. -- Maintain Vanc/Cefepime -- Blood and Urine cultures pending. -- Chest CT to evaluate for empyema. -- Urine Legionella urinary antigen pending. -- Strept pneumo urinary antigen pending. -- Consider Gentamycin if no improvement. 2  2. Altered Mental Status: Most likely due to pneumonia. Stroke is less likely because patient is able to move and ure extremities. Role of medication is unclear. Family is worried that patient over-uses benadryl, ativan, and percocet. Patient has had mild episodes of disorientation previously. Unclear if the patient became disorientated and aspirated causing pneumonia. Patient could also be suffering from hypertensive encephalopathy. -- Advise nursing to be conservative with ativan.  -- Place hand mits to protect HD line.   3. Hypertension: Patient has a long standing history of hypertension with a baseline SBP  160-170. Patient blood pressure is currently being managed with IV medications. Her blood pressure in the morning was 142/75. Will continue to monitor.  -- Maintain Hydralazine 10mg  iv QID -- Maintain Labetalol 10mg  iv QID   4. End Stage Renal Disease: Patient's has ESRD with MWF hemodialysis. Patient received HD last night, removing 3 kg of fluid. Patient does not appear volume overloaded. BUN is 19 this morning.  -- Being followed by nephrology, we appreciate their input.   5. CVA history: Patient has a history of stroke in 2003 that was characterized by left sided weakness. The weakness has completely resolved. Patient may have some weakness on her left, but she does not participate in the exam. She moves all extremities. She withdrawals to pain stimuli in her hands and feet bilaterally. But her altered mental status is more likely due to pneumonia.   6. Coronary Artery Disease: Patient has a history of CVA in 2003, CAD with a stent to her RCA in 2007, and iliac stenting. Given the patient's prior history, family history, 106 pack year history of smoking, ESRD, and uncontrolled hypertension; she is at high risk for further vascular insults. Her most recent lipid panel (11/29/10) was total cholesterol 145, LDL 80, TG 128, HDL 40.   7. Chronic Diastolic Heart  Failure: last catheterization on 02/02/09 shows an ejection fraction of 55%. She appears euvolemic today. She has minimal JVD, and no lower extremity edema.   8. Hypothyroidism: Patient has a history of hypothyroidism. Her TSH on admission was 1.536. -- Maintain Synthroid 88 mcg daily.    LOS: 1 day   Wilmer Floor 02/18/2011, 11:05 AM   Addendum:   Patient was seen examined and evaluated with MS4 Wylene Men.   Ms Whiteaker looks a little better overall as compared to yesterday. Although initially she was not responding at all but later on she stood up and sat in the bed by herself. She is still not responding to our questions in an  appropriate manner. She did not get antibiotics as ordered. She did get dialysis last night with 3.5 L taken out. No new complaints. Patient had a fever last night and blood cultures were drawn.  14 point ROS was negative except noted above. The history is somewhat limited due to Level 5 caveat and most of the history was obtained from her daughter.   BP 114/67  Pulse 94  Temp(Src) 98.5 F (36.9 C) (Oral)  Resp 24  Ht 5\' 2"  (1.575 m)  Wt 148 lb 11.2 oz (67.45 kg)  BMI 27.20 kg/m2  SpO2 99%  General Appearance:  Oriented to hospital only, fluent speech which did not make much sense   Head:  Normocephalic, without obvious abnormality, atraumatic   Eyes:  PERRL, conjunctiva/corneas clear, EOM's intact, fundi  benign, both eyes   Ears:  Normal TM's and external ear canals, both ears   Nose:  Nares normal, septum midline, mucosa normal, no drainage  or sinus tenderness   Throat:  Lips, mucosa, and tongue normal; teeth and gums normal   Neck:  Supple, symmetrical, trachea midline, no adenopathy;  thyroid: no enlargement/tenderness/nodules; no carotid  bruit or JVD   Back:  Symmetric, no curvature, ROM normal, no CVA tenderness   Lungs:  respirations unlabored, did not cooperate during the exam , difficult to hear breath sounds , dullness to percussion over the left lung   Chest Wall:  No tenderness or deformity   Heart:  Regular rate and rhythm, S1 and S2 normal, 2/6 ES murmur left sternal border, No rub  or gallop   Breast Exam:  No tenderness, masses, or nipple abnormality   Abdomen:  Soft, non-tender, bowel sounds active all four quadrants,  no masses, no organomegaly   Extremities:  Extremities normal, atraumatic, no cyanosis or edema   Pulses:  2+ and symmetric all extremities   Skin:  Skin color, texture, turgor normal, no rashes or lesions   Lymph nodes:  Cervical, supraclavicular, and axillary nodes normal   Neurologic:  Patient was non cooperative during the exam.      All  the labs and imaging studies were reviewed. Patient's chest x-ray findings were suggestive of worsening left lung infiltrates.   Assessment and plan:  1. Altered mental status: Likely 2/2 healthcare associated PNA 2. ESRD  3. CAD  4. HTN  5. Chronic diastolic heart failure  6. Hypothyroidism   Plan: The new infiltrate on chest x-ray, it seems like patient is having a progressive pneumonia. Blood cultures have already been drawn. Vanc and cefepime to cover for gram positives, MRSA and Pseudomonas has been put on board. We did also obtain a CT chest to rule out empyema or loculated pneumonia. Urine culture and urine studies are pending at this time. We will also order sputum culture  and Gram stain. Given the physical exam findings are not consistent with focal weakness we would hold on MRI and MRA for now.   Lars Mage MD  R3 Internal Medicine Resident  Pager [phone]  4:14 PM Internal Medicine Teaching Service Attending Note Date: 02/18/2011  Patient name: [name]  Medical record number: [id number]  Date of birth: 06/25/1945    This patient has been seen and discussed with the house staff. Please see their note for complete details. I concur with his findings.  Judyann Munson 02/18/2011, 1:43 PM

## 2011-02-18 NOTE — Progress Notes (Addendum)
ANTIBIOTIC CONSULT NOTE - INITIAL  Pharmacy Consult for gentamicin Indication: rule out pneumonia  Allergies  Allergen Reactions  . Iohexol Swelling and Other (See Comments)    1970s; passed out and had facial/tongue swelling.  Requires 13-hour prep with prednisone and benadryl  . Ampicillin Hives and Rash  . Meperidine Hcl Swelling and Rash    Makes tongue swell  . Morphine Rash  . Penicillins Rash  . Heparin     MDs told her not to take after reaction in ICU  . Pentazocine Lactate     Patient does not remember reaction to this med ([place]win).   . Phenytoin Other (See Comments)    Had reaction while in ICU; doesn't know.  MDs told her not to take ever again.  . Ace Inhibitors Rash    Patient Measurements: Height: 5\' 2"  (157.5 cm) Weight: 148 lb 11.2 oz (67.45 kg) IBW/kg (Calculated) : 50.1   Vital Signs: Temp: 98.9 F (37.2 C) (02/16 0733) Temp src: Oral (02/16 0733) BP: 142/75 mmHg (02/16 0634) Pulse Rate: 94  (02/16 0410) Intake/Output from previous day: 02/15 0701 - 02/16 0700 In: 330 [IV Piggyback:330] Out: -  Intake/Output from this shift:    Labs:  Basename 02/18/11 0825 02/17/11 0801  WBC -- 9.3  HGB -- 12.9  PLT -- 113*  LABCREA -- --  CREATININE 6.09* 7.42*   Estimated Creatinine Clearance: 8.2 ml/min (by C-G formula based on Cr of 6.09).   Microbiology: No results found for this or any previous visit (from the past 720 hour(s)).  Medical History: Past Medical History  Diagnosis Date  . CAD (coronary artery disease)     stent to RCA  . CVA (cerebral infarction) 2003    no apparent residual  . Renal failure     related to hypertension  . Hypothyroidism   . PVD (peripheral vascular disease)   . Hyperlipidemia   . Positive PPD     completed rifampin  . Diastolic congestive heart failure   . Renal insufficiency   . Hypertension   . COPD (chronic obstructive pulmonary disease) t  . Cancer     clear cell cancer, kidney  . Complication of  anesthesia 12/2010    pt is very confused, with AMS with anesthesia    Medications:  See med rec  Assessment: 66 yo F admitted with AMS, now found to have possible pneumonia and to begin vancomycin and Cefepime empirically.  Patient has ESRD and usual schedule is MWF, Pt received HD yesterday. Received vanc with HD but cefepime was not charted. PNA worse today per MD. Team decided to hold off gent for now.     Goal of Therapy:  Target pre-HD level 15-25 mcg/ml  Plan:  1.  Vancomycin  750 mg IV post each dialysis 2.  Cefepime 1 gm IV q 24hr

## 2011-02-19 ENCOUNTER — Inpatient Hospital Stay (HOSPITAL_COMMUNITY): Payer: Medicare Other

## 2011-02-19 DIAGNOSIS — I5032 Chronic diastolic (congestive) heart failure: Secondary | ICD-10-CM | POA: Diagnosis not present

## 2011-02-19 DIAGNOSIS — R4182 Altered mental status, unspecified: Secondary | ICD-10-CM | POA: Diagnosis not present

## 2011-02-19 DIAGNOSIS — I509 Heart failure, unspecified: Secondary | ICD-10-CM | POA: Diagnosis not present

## 2011-02-19 DIAGNOSIS — N186 End stage renal disease: Secondary | ICD-10-CM | POA: Diagnosis not present

## 2011-02-19 DIAGNOSIS — I10 Essential (primary) hypertension: Secondary | ICD-10-CM | POA: Diagnosis not present

## 2011-02-19 LAB — CBC
MCV: 83.5 fL (ref 78.0–100.0)
Platelets: 118 10*3/uL — ABNORMAL LOW (ref 150–400)
RBC: 5.51 MIL/uL — ABNORMAL HIGH (ref 3.87–5.11)
RDW: 19.6 % — ABNORMAL HIGH (ref 11.5–15.5)
WBC: 4.9 10*3/uL (ref 4.0–10.5)

## 2011-02-19 LAB — DIFFERENTIAL
Basophils Absolute: 0.1 10*3/uL (ref 0.0–0.1)
Lymphocytes Relative: 22 % (ref 12–46)
Lymphs Abs: 1.1 10*3/uL (ref 0.7–4.0)
Neutro Abs: 3 10*3/uL (ref 1.7–7.7)
Neutrophils Relative %: 61 % (ref 43–77)

## 2011-02-19 LAB — RENAL FUNCTION PANEL
CO2: 24 mEq/L (ref 19–32)
Chloride: 98 mEq/L (ref 96–112)
GFR calc Af Amer: 5 mL/min — ABNORMAL LOW (ref >90)
Glucose, Bld: 68 mg/dL — ABNORMAL LOW (ref 70–99)
Phosphorus: 7.3 mg/dL — ABNORMAL HIGH (ref 2.3–4.6)
Potassium: 5.5 mEq/L — ABNORMAL HIGH (ref 3.5–5.1)
Sodium: 137 mEq/L (ref 135–145)

## 2011-02-19 MED ORDER — LORAZEPAM 2 MG/ML IJ SOLN
2.0000 mg | Freq: Once | INTRAMUSCULAR | Status: AC
  Administered 2011-02-19: 2 mg via INTRAMUSCULAR

## 2011-02-19 MED ORDER — LEVOTHYROXINE SODIUM 100 MCG IV SOLR
50.0000 ug | Freq: Every day | INTRAVENOUS | Status: DC
  Filled 2011-02-19 (×2): qty 2.5

## 2011-02-19 MED ORDER — HALOPERIDOL LACTATE 5 MG/ML IJ SOLN
5.0000 mg | Freq: Four times a day (QID) | INTRAMUSCULAR | Status: DC | PRN
  Administered 2011-02-20: 5 mg via INTRAMUSCULAR
  Filled 2011-02-19 (×2): qty 1

## 2011-02-19 MED ORDER — LORAZEPAM 2 MG/ML IJ SOLN: 2.0000 mg | INTRAMUSCULAR | Status: DC | PRN

## 2011-02-19 MED ORDER — SODIUM CHLORIDE 0.9 % IJ SOLN
INTRAMUSCULAR | Status: AC
  Administered 2011-02-19: 09:00:00
  Filled 2011-02-19: qty 10

## 2011-02-19 MED ORDER — HALOPERIDOL LACTATE 5 MG/ML IJ SOLN: 2.0000 mg | INTRAMUSCULAR | Status: DC | PRN

## 2011-02-19 MED ORDER — HEPARIN SODIUM (PORCINE) 1000 UNIT/ML DIALYSIS: 20.0000 [IU]/kg | INTRAMUSCULAR | Status: DC | PRN

## 2011-02-19 MED ORDER — SODIUM CHLORIDE 0.9 % IJ SOLN
INTRAMUSCULAR | Status: AC
  Filled 2011-02-19: qty 50

## 2011-02-19 NOTE — Progress Notes (Addendum)
Subjective: Patient continues to be significantly confused and altered from her baseline. Patient pulled her left IJ catheter out yesterday. She does not have an IV access at this time. No fevers reported in the last 24 hours.   Objective: Vital signs in last 24 hours: Filed Vitals:   02/18/11 2004 02/18/11 2053 02/18/11 2331 02/19/11 0330  BP: 139/79  154/80 185/101  Pulse: 82  88 91  Temp: 98.2 F (36.8 C)  98.2 F (36.8 C) 98.2 F (36.8 C)  TempSrc: Oral  Oral Oral  Resp: 19  18 17   Height:      Weight:    146 lb 6.2 oz (66.4 kg)  SpO2: 99% 96% 95% 96%    Physical Exam: Gen: Patient is alert oriented x 0 Head: Normocephalic, Atraumatic.  Eyes: Pupils equally round and reactive to light bilaterally. Sclera non-injected and anicteric.  Mouth: Normal dentition. Moist mucus membranes.  Neck: Trachea midline. No cervical lymphadenopathy. No thyroidmegaly.  Pulmonary: Patient does not participate in exam. Breath sounds difficult to hear bilaterally. L>R dullness to percussion.  Cardiac: Regular rate and rhythm. No murmurs, rubs, or gallops. Normal S1 and S2.  GI: Bowel sounds present. Non-tender to palpation. No hepatosplenomegaly. Well healed surgical scars.  Extremities: 1+ left radial pulse. 2+ right radial pulse. 2+ pedal pulses bilaterally. Left arm is cool to touch. No clubbing. Patient's grafts are non-erythematous and non-tender.  Neuro: Moving all extremities. Does not follow commands. Psych:  Not orientated to person time or place. Patient is babbling words which are difficult to understand  Lab Results: Patient's renal panel shows generalized worsening consistent with accumulation of the renal byproducts.  Micro Results: Blood Cultures : No growth to date Urine Cultures Pending urine strep antigen is negative  Studies/Results: Dg Chest Portable 1 View (02/17/2011) at 1034 IMPRESSION: Vague opacities at the lung bases medially.  Pneumonia cannot be excluded.  Stable  cardiomegaly.   Dg Chest 1 View (02/17/2011) at 1533 IMPRESSION: Enlargement of cardiac silhouette with pulmonary vascular congestion. Bibasilar consolidation.    Dg Chest Port 1 View (02/18/1011)  IMPRESSION: Overall worsening of asymmetric infiltrates or edema, left greater than right.    Ct Head Wo Contrast (02/18/2011) IMPRESSION: Limited examination due to excessive patient motion.  No significant change from the prior examination.  Old ischemic changes in the left cerebral cortex and cerebellar hemispheres.  Medications: I have reviewed the patient's current medications. Scheduled Meds:    . albuterol-ipratropium  2 puff Inhalation Q6H  . ceFEPime (MAXIPIME) IV  1 g Intravenous Q24H  . Fluticasone-Salmeterol  1 puff Inhalation Q12H  . hydrALAZINE  10 mg Intravenous Q6H  . labetalol  10 mg Intravenous Q6H  . levothyroxine  50 mcg Intravenous QAC breakfast  . LORazepam  1 mg Intravenous Once  . LORazepam  2 mg Intramuscular Once  . LORazepam  2 mg Intramuscular Once  . pantoprazole (PROTONIX) IV  40 mg Intravenous QHS  . paricalcitol  6 mcg Intravenous Q M,W,F-HD  . sodium chloride  3 mL Intravenous Q12H  . sodium chloride      . sodium chloride      . sodium chloride      . sodium chloride      . vancomycin  750 mg Intravenous Q M,W,F-HD   Continuous Infusions:  PRN Meds:.haloperidol lactate, LORazepam, LORazepam Assessment/Plan: 1. Health care associated Pneumonia: She was started on Vancomycin and Cefepime. Will continue broad pneumonia coverage. If patient pneumonia does not improve, will consider  broadening coverage with Gentamycin. CT chest obtained yesterday was consistent with infectious opacity the left upper and middle lung. -- Maintain Vanc/Cefepime --Will obtain CBC with differential to measure improvement in WBC count -- Consider Gentamycin if no improvement. 2  2. Altered Mental Status: Most likely due to pneumonia. Stroke is less likely because patient is able  to move allextremities. Role of medication is unclear. Family is worried that patient over-uses benadryl, ativan, and percocet. Patient has had mild episodes of disorientation previously. Unclear if the patient became disorientated and aspirated causing pneumonia. Patient could also be suffering from hypertensive encephalopathy. -- Advise nursing to be conservative with ativan.  -- Place hand mits to protect HD line.  --start IV BP meds as scheduled.  3. Hypertension: Patient has a long standing history of hypertension with a baseline SBP 160-170. Patient blood pressure is currently being managed with IV medications. Her blood pressure in the morning was 142/75. Will continue to monitor.  -- Maintain Hydralazine 10mg  iv QID -- Maintain Labetalol 10mg  iv QID   4. End Stage Renal Disease: Patient has ESRD with MWF hemodialysis. Patient received HD last night, removing 3 kg of fluid. Patient does not appear volume overloaded. BUN is 36 in morning.  -- Being followed by nephrology, we appreciate their input.  --may need another HD today  5 Coronary Artery Disease: Patient has a history of CVA in 2003, CAD with a stent to her RCA in 2007, and iliac stenting. Given the patient's prior history, family history, 106 pack year history of smoking, ESRD, and uncontrolled hypertension; she is at high risk for further vascular insults. Her most recent lipid panel (11/29/10) was total cholesterol 145, LDL 80, TG 128, HDL 40.   6. Chronic Diastolic Heart Failure: last catheterization on 02/02/09 shows an ejection fraction of 55%. She appears euvolemic today. She has minimal JVD, and no lower extremity edema.   7. Hypothyroidism: Patient has a history of hypothyroidism. Her TSH on admission was 1.536. -- Maintain Synthroid daily IV  Lars Mage MD [facility] Internal Medicine Resident Pager [phone] 10:45 AM  Internal Medicine Teaching Service Attending Note Date: 02/19/2011  Patient name: [name]  Medical record number: [id number]  Date of birth: 1945/07/04    This patient has been seen and discussed with the house staff. Please see their note for complete details. I concur with their findings with the following additions/corrections: patient's overall mental status not clearly improved. She has episodes of waxing and waning. She is on broad spectrum atbx for presumed HCAP. Does not appear to be encephalopathic from infectious standpoint. She does have element of paradoxical effect of increasing agitation with ativan. We will use haldol for her moments of sundowning/inc. Agitation. If still not at baseline, will push for LP in the morning. Anticipate that she will  Need HD today or latest by tomorrow. Appreciate renal consult/mgmt  Loris Winrow 02/19/2011, 1:09 PM

## 2011-02-19 NOTE — Progress Notes (Signed)
Subjective:  Fevers resolving and CXR looks much better today.  Pt still confused, in restraints.  Family does offer that patient has had some memory problems over the last year or so and they have been wondering if she is developing some early dementia, comes and goes, not all the time. Objective:    Vital signs in last 24 hours: Filed Vitals:   02/18/11 2004 02/18/11 2053 02/18/11 2331 02/19/11 0330  BP: 139/79  154/80 185/101  Pulse: 82  88 91  Temp: 98.2 F (36.8 C)  98.2 F (36.8 C) 98.2 F (36.8 C)  TempSrc: Oral  Oral Oral  Resp: 19  18 17   Height:      Weight:    66.4 kg (146 lb 6.2 oz)  SpO2: 99% 96% 95% 96%   Weight change: -8.7 kg (-19 lb 2.9 oz)  Intake/Output Summary (Last 24 hours) at 02/19/11 1257 Last data filed at 02/19/11 0600  Gross per 24 hour  Intake     53 ml  Output      0 ml  Net     53 ml   Labs: Basic Metabolic Panel:  Lab 02/19/11 4098 02/18/11 0825 02/17/11 0837 02/17/11 0801  NA 137 139 -- 137  K 5.5* 4.8 -- 4.6  CL 98 99 -- 97  CO2 24 25 -- 25  GLUCOSE 68* 89 -- 119*  BUN 36* 19 -- 27*  CREATININE 8.15* 6.09* -- 7.42*  ALB -- -- -- --  CALCIUM 10.9* 10.8* -- 10.9*  PHOS 7.3* 5.9* 5.4* --   Liver Function Tests:  Lab 02/19/11 0437 02/18/11 0825 02/17/11 0801  AST -- -- 15  ALT -- -- 8  ALKPHOS -- -- 78  BILITOT -- -- 0.5  PROT -- -- 6.8  ALBUMIN 2.9* 2.9* 3.3*   No results found for this basename: LIPASE:3,AMYLASE:3 in the last 168 hours No results found for this basename: AMMONIA:3 in the last 168 hours CBC:  Lab 02/19/11 1128 02/17/11 0801  WBC 4.9 9.3  NEUTROABS 3.0 --  HGB 14.2 12.9  HCT 46.0 42.8  MCV 83.5 83.3  PLT 118* 113*   Cardiac Enzymes: No results found for this basename: CKTOTAL:5,CKMB:5,CKMBINDEX:5,TROPONINI:5 in the last 168 hours CBG:  Lab 02/17/11 2247  GLUCAP 95    Iron Studies: No results found for this basename: IRON:30,TIBC:30,SATURATION RATIOS:30,TRANSFERRIN:30,FERRITIN:30 in the last 168  hours Studies/Results: Dg Chest 1 View  02/17/2011  *RADIOLOGY REPORT*  Clinical Data: Altered mental status  CHEST - 1 VIEW  Comparison: Exam at 1533 hours compared to 02/17/2011 at 1010 hours  Findings: Rotated to the left. Enlargement of cardiac silhouette with pulmonary vascular congestion. Bibasilar consolidation. Atherosclerotic calcification aortic arch. No gross pleural effusion or pneumothorax. Bones demineralized.  IMPRESSION: Enlargement of cardiac silhouette with pulmonary vascular congestion. Bibasilar consolidation.  Original Report Authenticated By: Lollie Marrow, M.D.   Ct Head Wo Contrast  02/18/2011  *RADIOLOGY REPORT*  Clinical Data: Altered mental status.  CT HEAD WITHOUT CONTRAST  Technique:  Contiguous axial images were obtained from the base of the skull through the vertex without contrast.  Comparison: 10/10/2009  Findings: There is stable low density and encephalomalacia involving the left frontal lobe and left parietal lobe. Again noted are low density lacunar infarcts throughout the cerebellum.  The study is limited due to excessive patient motion despite repeat images.  No evidence for acute hemorrhage, midline shift or hydrocephalus.  No evidence for a new large infarct.  No acute bony abnormality.  IMPRESSION: Limited examination due to excessive patient motion.  No significant change from the prior examination.  Old ischemic changes in the left cerebral cortex and cerebellar hemispheres.  Original Report Authenticated By: Richarda Overlie, M.D.   Ct Chest Wo Contrast  02/18/2011  *RADIOLOGY REPORT*  Clinical Data: Evaluate progressive pneumonia.  History of coronary artery disease, CVA.  Renal failure.  Hypertension.  End-stage renal disease.  CT CHEST WITHOUT CONTRAST  Technique:  Multidetector CT imaging of the chest was performed following the standard protocol without IV contrast.  Comparison: Plain films, including earlier in the day.  No prior CT.  Findings: Lung windows  demonstrate patent airways.  Patchy airspace disease within the dependent left upper lobe. Similar findings within the dependent left lower lobe.  No well-defined mass.  Soft tissue windows demonstrate  mild degradation secondary to lack of IV contrast and patient arm position.  Collateral vessels about the right chest suggests central venous insufficiency.  Moderate cardiomegaly with a small pericardial effusion.  Age advanced dense coronary artery atherosclerosis.  Trace left pleural fluid.  Enlarged pulmonary outflow tract, 3.2 cm.  Mitral annular calcifications.  Prevascular nodes measure up to 9 mm, image 16.  Limited evaluation for hilar adenopathy, secondary to unenhanced technique.  No middle mediastinal adenopathy.  Limited abdominal imaging demonstrates under distended gastric body.  Apparent wall thickening could be secondary.  Mild right adrenal thickening.  Right renal atrophy with low density indeterminate lesions.  Left kidney absent. No acute osseous abnormality. Renal osteodystrophy.  IMPRESSION: 1.  Patchy dependent left upper and left lower lobe airspace disease.  Favor a component of infection.  When comparing scout film to the plain film of earlier today, is felt to be improved. 2.  Trace left pleural fluid. 3.  Cardiomegaly with small pericardial effusion and dense advanced coronary artery atherosclerosis. 4. Pulmonary artery enlargement suggests pulmonary arterial hypertension. 5.  Prominent but not pathologically enlarged prevascular nodes, likely reactive. 6.  Degraded exam due to lack of IV contrast and patient arm position. 7.  Atrophic right kidney with right renal lesions which were better evaluated on 01/26/2011. 8.  Apparent gastric wall thickening, possibly due to underdistension. Correlate with any symptoms of gastritis.  Original Report Authenticated By: Consuello Bossier, M.D.   Dg Chest Port 1 View  02/19/2011  *RADIOLOGY REPORT*  Clinical Data: Central line placement.  PORTABLE  CHEST - 1 VIEW  Comparison: 02/18/2011 CT and plain film.  Findings: Left IJ central line terminates high, at the left jugular vein versus peripheral left brachiocephalic.  No pneumothorax.  Midline trachea.  Cardiomegaly accentuated by AP portable technique.  Probable small left pleural effusion.  Mild interstitial edema is new or increased.  Improved left upper lobe aeration with persistent lower lobe airspace disease.  IMPRESSION:  1.  Left IJ central line high in position.  This should be advanced at least 8 cm.  The study was made a "call report. 2. No pneumothorax. 3.  Cardiomegaly with developing interstitial edema. 4.  Improved aeration with persistent left base air space disease.  Original Report Authenticated By: Consuello Bossier, M.D.   Dg Chest Port 1 View  02/18/2011  *RADIOLOGY REPORT*  Clinical Data: Pulmonary infiltrates  PORTABLE CHEST - 1 VIEW  Comparison:   the previous day's study  Findings: There is been interval worsening of interstitial airspace opacities throughout the left lung.  Some improvement in aeration at the right lung base.  Some new mild atelectasis or infiltrate medially  in the right upper lung.  Mild cardiomegaly stable. Cannot exclude small left pleural effusion.  IMPRESSION:  1.  Overall worsening of asymmetric infiltrates or edema, left greater than right.  Original Report Authenticated By: Osa Craver, M.D.   Medications:      . albuterol-ipratropium  2 puff Inhalation Q6H  . ceFEPime (MAXIPIME) IV  1 g Intravenous Q24H  . Fluticasone-Salmeterol  1 puff Inhalation Q12H  . hydrALAZINE  10 mg Intravenous Q6H  . labetalol  10 mg Intravenous Q6H  . levothyroxine  50 mcg Intravenous QAC breakfast  . LORazepam  1 mg Intravenous Once  . LORazepam  2 mg Intramuscular Once  . LORazepam  2 mg Intramuscular Once  . pantoprazole (PROTONIX) IV  40 mg Intravenous QHS  . paricalcitol  6 mcg Intravenous Q M,W,F-HD  . sodium chloride  3 mL Intravenous Q12H  .  sodium chloride      . sodium chloride      . sodium chloride      . vancomycin  750 mg Intravenous Q M,W,F-HD    I  have reviewed scheduled and prn medications.  Physical Exam:  Blood pressure 185/101, pulse 91, temperature 98.2 F (36.8 C), temperature source Oral, resp. rate 17, height 5\' 2"  (1.575 m), weight 66.4 kg (146 lb 6.2 oz), SpO2 96.00%.  General: remains confused.  Knows she is in the hospital, not sure of date.  Less agitated. Moving all extremities. Neck: supple, no jvd Heart:RRR, no rub Lungs: some faint crackles at the bases Abdomen: BS+ ,soft , nontender  Extremities:no pedal edema  Skin:warm ,dry , no overt ulcers  Neuro:as above Dialysis Access:positive bruit LUA access (HeRO)  Outpatient Dialysis Orders: Center: [place] on MWF .  EDW 71.5 HD Bath 2.0 ca, 2.0 k Time 4hrs Heparin none ALLERGY TO HEPARIN. Access left upper arm hero BFR 400 DFR 800  Zemplar 6 mcg IV/HD Epogen 28000 Units IV/HD Venofer 0  Other 0   Assessment/Plan:  1. AMS- old strokes by CT, prob TME.  May have some early underlying dementia per family.     2. ESRD - had HD on Friday night with 3 kg off, she is now under EDW by weights, about 5 kg.  CXR shows improved PNA, though still has IS edema pattern and BP still high.  Will do HD on Monday on schedule, attempt 2-3 kg more as tolerated.   3. Hypertension- getting prn IV meds  4. Fever/pulm infiltrates- better on vanc/cefipime 5. Metabolic bone disease - zemplar and binders , fu ca and phos  6. Hx of renal cell Ca 7. HO CVA 2003  8. Hx CAD/MI stent  2007  9. Hx of HIT 2007  Vinson Moselle  MD [facility] [phone] pgr    [phone] cell 02/19/2011, 12:57 PM

## 2011-02-19 NOTE — Progress Notes (Signed)
Notified dr Maisie Fus patient pulled out external jugular iv, bp iv medications due at 0600, no orders received, dr Maisie Fus will notify the 0700 dr and i will continue to monitor  Donell Beers, [name]

## 2011-02-19 NOTE — Procedures (Signed)
Internal Medicine Teaching Service Attending Note Date: 02/19/2011  Patient name: [name]  Medical record number: [id number]  Date of birth: 10-23-1945    This patient has been seen and discussed with the house staff. Please see their note for complete details. I concur with the patient's need for venous access. Dr. Eben Burow has placed a temporary IJ for renal to decide if switch out. Will get cxr to assess proper placement.  [name] 02/19/2011, 1:08 PM

## 2011-02-19 NOTE — Procedures (Signed)
PROCEDURE:  Internal jugular central venous catheter, U/S guided.                                 INDICATION: NO IV acces for dialysis                           PROCEDURE: Internal Jugular central venous, ultrasound guided OPERATOR: Lars Mage MD, Jory Ee MS3, supervised by Aram Beecham snider MD                                          CONSENT: Obtained from daughter                         PROCEDURE SUMMARY: a left IJ was placed without any complications                                                                  A time-out was performed. The patient's LEFT neck region was prepped and draped in sterile fashion using chlorhexidine scrub. Anesthesia was achieved with 1% lidocaine. The LEFT internal jugular vein was accessed under ultrasound guidance using a finder needle and sheath. U/S images were permanently documented. Venous blood was withdrawn and the sheath was advanced into the vein and the needle was withdrawn. A guidewire was advanced through the sheath. A small incision was made with a 10 blade scalpel and the sheath was exchanged for a dilator over the guidewire until appropriate dilation was obtained. The dilator was removed and an 8.5 [place] central venous tri-lumen catheter was advanced over the guidewire and secured into place with 4 sutures at <_12_> cm. At time of procedure completion, all ports aspirated and flushed properly. Post-procedure x-ray is pending at this time.  COMPLICATIONS: None  ESTIMATED BLOOD LOSS: 50cc  Lars Mage MD R3 Internal Medicine Resident Pager [phone] 10:19 AM

## 2011-02-20 ENCOUNTER — Inpatient Hospital Stay (HOSPITAL_COMMUNITY): Payer: Medicare Other

## 2011-02-20 LAB — CBC
HCT: 41.7 % (ref 36.0–46.0)
Hemoglobin: 13.1 g/dL (ref 12.0–15.0)
MCV: 81.1 fL (ref 78.0–100.0)
RBC: 5.14 MIL/uL — ABNORMAL HIGH (ref 3.87–5.11)
RDW: 19.4 % — ABNORMAL HIGH (ref 11.5–15.5)
WBC: 7 10*3/uL (ref 4.0–10.5)

## 2011-02-20 LAB — URINE CULTURE: Culture  Setup Time: 201302170240

## 2011-02-20 LAB — RENAL FUNCTION PANEL
CO2: 22 mEq/L (ref 19–32)
Chloride: 98 mEq/L (ref 96–112)
Creatinine, Ser: 10.43 mg/dL — ABNORMAL HIGH (ref 0.50–1.10)
GFR calc Af Amer: 4 mL/min — ABNORMAL LOW (ref >90)
GFR calc non Af Amer: 3 mL/min — ABNORMAL LOW (ref >90)

## 2011-02-20 MED ORDER — LIDOCAINE HCL (PF) 1 % IJ SOLN
INTRAMUSCULAR | Status: AC
  Filled 2011-02-20: qty 5

## 2011-02-20 MED ORDER — MIDAZOLAM HCL 2 MG/2ML IJ SOLN
INTRAMUSCULAR | Status: AC
  Filled 2011-02-20: qty 4

## 2011-02-20 MED ORDER — IPRATROPIUM BROMIDE 0.02 % IN SOLN
0.5000 mg | Freq: Four times a day (QID) | RESPIRATORY_TRACT | Status: DC
  Administered 2011-02-20 – 2011-02-23 (×8): 0.5 mg via RESPIRATORY_TRACT
  Filled 2011-02-20 (×7): qty 2.5

## 2011-02-20 MED ORDER — PARICALCITOL 5 MCG/ML IV SOLN
INTRAVENOUS | Status: AC
  Administered 2011-02-20: 6 ug via INTRAVENOUS
  Filled 2011-02-20: qty 2

## 2011-02-20 MED ORDER — LABETALOL HCL 5 MG/ML IV SOLN: 10.0000 mg | Freq: Once | INTRAVENOUS | Status: DC

## 2011-02-20 MED ORDER — ALBUTEROL SULFATE (5 MG/ML) 0.5% IN NEBU
INHALATION_SOLUTION | RESPIRATORY_TRACT | Status: AC
  Administered 2011-02-20: 21:00:00
  Filled 2011-02-20: qty 0.5

## 2011-02-20 MED ORDER — LABETALOL HCL 5 MG/ML IV SOLN
INTRAVENOUS | Status: AC
  Administered 2011-02-21: 10 mg via INTRAVENOUS
  Filled 2011-02-20: qty 4

## 2011-02-20 MED ORDER — LORAZEPAM 2 MG/ML IJ SOLN: 2.0000 mg | Freq: Once | INTRAMUSCULAR | Status: DC

## 2011-02-20 MED ORDER — ACYCLOVIR SODIUM 50 MG/ML IV SOLN
5.0000 mg/kg | INTRAVENOUS | Status: DC
  Administered 2011-02-22: 320 mg via INTRAVENOUS
  Filled 2011-02-20 (×3): qty 6.4

## 2011-02-20 MED ORDER — ALBUTEROL SULFATE (5 MG/ML) 0.5% IN NEBU
2.5000 mg | INHALATION_SOLUTION | Freq: Four times a day (QID) | RESPIRATORY_TRACT | Status: DC
  Administered 2011-02-20 – 2011-02-23 (×8): 2.5 mg via RESPIRATORY_TRACT
  Filled 2011-02-20 (×8): qty 0.5

## 2011-02-20 MED ORDER — LEVOTHYROXINE SODIUM 100 MCG IV SOLR
44.0000 ug | Freq: Every day | INTRAVENOUS | Status: DC
  Administered 2011-02-21 – 2011-02-22 (×2): 44 ug via INTRAVENOUS
  Filled 2011-02-20 (×4): qty 2.2

## 2011-02-20 MED ORDER — IPRATROPIUM BROMIDE 0.02 % IN SOLN
RESPIRATORY_TRACT | Status: AC
  Administered 2011-02-20: 21:00:00
  Filled 2011-02-20: qty 2.5

## 2011-02-20 NOTE — Progress Notes (Signed)
02/20/11 0645 Pt taken to HD. Pt is stable but confused. Family at bedside.   Elisha Headland RN

## 2011-02-20 NOTE — Progress Notes (Signed)
ANTIBIOTIC CONSULT NOTE - INITIAL  Pharmacy Consult for Acyclovir Indication: r/o HSV encephalopathy  Allergies  Allergen Reactions  . Iohexol Swelling and Other (See Comments)    1970s; passed out and had facial/tongue swelling.  Requires 13-hour prep with prednisone and benadryl  . Ampicillin Hives and Rash  . Meperidine Hcl Swelling and Rash    Makes tongue swell  . Morphine Rash  . Penicillins Rash  . Heparin     MDs told her not to take after reaction in ICU  . Pentazocine Lactate     Patient does not remember reaction to this med ([facility]).   . Phenytoin Other (See Comments)    Had reaction while in ICU; doesn't know.  MDs told her not to take ever again.  . Ace Inhibitors Rash    Patient Measurements: Height: 5\' 2"  (157.5 cm) Weight: 141 lb 1.5 oz (64 kg) IBW/kg (Calculated) : 50.1   Vital Signs: Temp: 97.8 F (36.6 C) (02/18 1125) Temp src: Oral (02/18 1125) BP: 208/97 mmHg (02/18 1125) Pulse Rate: 108  (02/18 1125) Intake/Output from previous day: 02/17 0701 - 02/18 0700 In: 10 [IV Piggyback:10] Out: -  Intake/Output from this shift: Total I/O In: 120 [P.O.:120] Out: 3000 [Other:3000]  Labs:  Basename 02/20/11 0821 02/20/11 0820 02/19/11 1128 02/19/11 0437 02/18/11 0825  WBC -- 7.0 4.9 -- --  HGB -- 13.1 14.2 -- --  PLT -- 120* 118* -- --  LABCREA -- -- -- -- --  CREATININE 10.43* -- -- 8.15* 6.09*   Estimated Creatinine Clearance: 4.7 ml/min (by C-G formula based on Cr of 10.43). No results found for this basename: VANCOTROUGH:2,VANCOPEAK:2,VANCORANDOM:2,GENTTROUGH:2,GENTPEAK:2,GENTRANDOM:2,TOBRATROUGH:2,TOBRAPEAK:2,TOBRARND:2,AMIKACINPEAK:2,AMIKACINTROU:2,AMIKACIN:2, in the last 72 hours   Microbiology: Recent Results (from the past 720 hour(s))  CULTURE, BLOOD (ROUTINE X 2)     Status: Normal (Preliminary result)   Collection Time   02/17/11  6:42 PM      Component Value Range Status Comment   Specimen Description BLOOD HEMODIALYSIS GRAFT    Final    Special Requests BOTTLES DRAWN AEROBIC AND ANAEROBIC 10CC EACH   Final    Culture  Setup Time [id number]   Final    Culture     Final    Value:        BLOOD CULTURE RECEIVED NO GROWTH TO DATE CULTURE WILL BE HELD FOR 5 DAYS BEFORE ISSUING A FINAL NEGATIVE REPORT   Report Status PENDING   Incomplete   CULTURE, BLOOD (ROUTINE X 2)     Status: Normal (Preliminary result)   Collection Time   02/17/11  6:50 PM      Component Value Range Status Comment   Specimen Description BLOOD HEMODIALYSIS GRAFT   Final    Special Requests BOTTLES DRAWN AEROBIC AND ANAEROBIC 10CC EACH   Final    Culture  Setup Time [id number]   Final    Culture     Final    Value:        BLOOD CULTURE RECEIVED NO GROWTH TO DATE CULTURE WILL BE HELD FOR 5 DAYS BEFORE ISSUING A FINAL NEGATIVE REPORT   Report Status PENDING   Incomplete   URINE CULTURE     Status: Normal   Collection Time   02/18/11  5:35 PM      Component Value Range Status Comment   Specimen Description URINE, CLEAN CATCH   Final    Special Requests NONE   Final    Culture  Setup Time [id number]   Final  Colony Count NO GROWTH   Final    Culture NO GROWTH   Final    Report Status 02/20/2011 FINAL   Final     Medical History: Past Medical History  Diagnosis Date  . CAD (coronary artery disease)     stent to RCA  . CVA (cerebral infarction) 2003    no apparent residual  . Renal failure     related to hypertension  . Hypothyroidism   . PVD (peripheral vascular disease)   . Hyperlipidemia   . Positive PPD     completed rifampin  . Diastolic congestive heart failure   . Renal insufficiency   . Hypertension   . COPD (chronic obstructive pulmonary disease) t  . Cancer     clear cell cancer, kidney  . Complication of anesthesia 12/2010    pt is very confused, with AMS with anesthesia    Medications:  Scheduled:    . albuterol-ipratropium  2 puff Inhalation Q6H  . ceFEPime (MAXIPIME) IV  1 g Intravenous Q24H  .  Fluticasone-Salmeterol  1 puff Inhalation Q12H  . hydrALAZINE  10 mg Intravenous Q6H  . labetalol      . labetalol  10 mg Intravenous Q6H  . labetalol  10 mg Intravenous Once  . labetalol  10 mg Intravenous Once  . levothyroxine  44 mcg Intravenous QAC breakfast  . LORazepam  1 mg Intravenous Once  . pantoprazole (PROTONIX) IV  40 mg Intravenous QHS  . paricalcitol  6 mcg Intravenous Q M,W,F-HD  . sodium chloride  3 mL Intravenous Q12H  . sodium chloride      . sodium chloride      . vancomycin  750 mg Intravenous Q M,W,F-HD  . DISCONTD: levothyroxine  50 mcg Intravenous QAC breakfast   Infusions:   Anti-infectives     Start     Dose/Rate Route Frequency Ordered Stop   02/20/11 1200   vancomycin (VANCOCIN) 750 mg in sodium chloride 0.9 % 150 mL IVPB        750 mg 150 mL/hr over 60 Minutes Intravenous Every M-W-F (Hemodialysis) 02/17/11 1910     02/20/11 1200   gentamicin (GARAMYCIN) 140 mg in dextrose 5 % 50 mL IVPB  Status:  Discontinued        140 mg 107 mL/hr over 30 Minutes Intravenous Every M-W-F (Hemodialysis) 02/18/11 1016 02/18/11 1020   02/19/11 0600   acyclovir (ZOVIRAX) 500 mg in dextrose 5 % 100 mL IVPB  Status:  Discontinued        10 mg/kg  50.1 kg (Ideal) 110 mL/hr over 60 Minutes Intravenous Every 24 hours 02/18/11 0850 02/18/11 1003   02/18/11 1030   ceFEPIme (MAXIPIME) 1 g in dextrose 5 % 50 mL IVPB        1 g 100 mL/hr over 30 Minutes Intravenous Every 24 hours 02/17/11 1910     02/18/11 1030   gentamicin (GARAMYCIN) 180 mg in dextrose 5 % 50 mL IVPB  Status:  Discontinued        180 mg 109 mL/hr over 30 Minutes Intravenous  Once 02/18/11 1016 02/18/11 1020   02/17/11 2100   vancomycin (VANCOCIN) 1,500 mg in sodium chloride 0.9 % 500 mL IVPB  Status:  Discontinued        1,500 mg 250 mL/hr over 120 Minutes Intravenous To Hemodialysis 02/17/11 1910 02/17/11 2038   02/17/11 2100   ceFEPIme (MAXIPIME) 1 g in dextrose 5 % 50 mL IVPB  Status:  Discontinued  1 g 100 mL/hr over 30 Minutes Intravenous To Hemodialysis 02/17/11 1910 02/18/11 1007   02/17/11 2100   vancomycin (VANCOCIN) 1,500 mg in sodium chloride 0.9 % 250 mL IVPB        1,500 mg 250 mL/hr over 60 Minutes Intravenous  Once 02/17/11 2040 02/18/11 0000   02/17/11 1500   acyclovir (ZOVIRAX) 500 mg in dextrose 5 % 100 mL IVPB  Status:  Discontinued        10 mg/kg  50.1 kg (Ideal) 110 mL/hr over 60 Minutes Intravenous 3 times per day 02/17/11 1418 02/18/11 0850         Assessment: 66 yo F admitted with AMS, now found to have possible pneumonia on vancomycin and Cefepime empirically. Patient has ESRD and usual dialysis schedule is MWF.  Now to restart Acyclovir for r/o HSV encephalopathy.  Medication Review: Anticoagulation: SCDs for VTE prophylaxis  Infectious Disease: Admitted with AMS. CXR showed infiltrate. Received vanc 1.5g load 2/15. Vanc/cefepime (2/16 >>). Acyclovir dced on 2/16 (was on 10mg /kg IV daily).  Infectious disease restarting Acyclovir as patient remains confused, empiric for HSV encephalopathy while check LP. Afebrile. WBC wnl. Urine cx negative, Blood cx ngtd. Strep Pneumo Urinary Ag negative.  Plan: Continue Vancomycin 750mg  MWF- consider HD level if appropriate Continue Cefepime 1g q24 Restart Acyclovir for potential HSV encephalopathy. Follow-up LP  Cardiovascular: CAD, PVD, lipidemia, HTN, CHF (55%) - BP elevated 207/100,  HR 108 (ST, NSR). Admitted with very elevated BP. Hydralazine q6, labetalol q6, Renal recommends IV enalapril with change to po when able  Endocrinology: hypothyroidism - TSH wnl - synthroid IV at 1/2 home dose.   Gastrointestinal / Nutrition: NPO-sips and chips  Neurology: CVA - CT no acute findings. Confused prior to HD.   Nephrology: ESRD - HD MWF. K 5.2, Phos 7.3- Renal adding phosphate binder, HD today- received 4 hour session at BFR 400. Vancomycin dose given post-HD.   Pulmonary: COPD - 96% 2L On Advair,  combivent  Hematology / Oncology: ESRD, h/h ok, platelets 120  PTA Medication Issues: meds still on hold Best Practices: SCDs  Goal of Therapy:  Clinical Resolution  Plan:  1. Restart Acyclovir at 5mg /kg IV q24 - give after HD on HD days.  2. Follow-up LP cultures.   Fayne Norrie 02/20/2011,1:12 PM

## 2011-02-20 NOTE — Progress Notes (Signed)
INITIAL ADULT NUTRITION ASSESSMENT Date: 02/20/2011   Time: 3:27 PM  Reason for Assessment: Low Braden; Nutrition Risk report  ASSESSMENT: Female 66 y.o.  Dx: Altered mental state  Hx:  Past Medical History  Diagnosis Date  . CAD (coronary artery disease)     stent to RCA  . CVA (cerebral infarction) 2003    no apparent residual  . Renal failure     related to hypertension  . Hypothyroidism   . PVD (peripheral vascular disease)   . Hyperlipidemia   . Positive PPD     completed rifampin  . Diastolic congestive heart failure   . Renal insufficiency   . Hypertension   . COPD (chronic obstructive pulmonary disease) t  . Cancer     clear cell cancer, kidney  . Complication of anesthesia 12/2010    pt is very confused, with AMS with anesthesia    Related Meds:  Scheduled Meds:   . acyclovir  5 mg/kg Intravenous Q24H  . albuterol-ipratropium  2 puff Inhalation Q6H  . ceFEPime (MAXIPIME) IV  1 g Intravenous Q24H  . Fluticasone-Salmeterol  1 puff Inhalation Q12H  . hydrALAZINE  10 mg Intravenous Q6H  . labetalol      . labetalol  10 mg Intravenous Q6H  . labetalol  10 mg Intravenous Once  . labetalol  10 mg Intravenous Once  . levothyroxine  44 mcg Intravenous QAC breakfast  . lidocaine      . LORazepam  1 mg Intravenous Once  . LORazepam  2 mg Intravenous Once  . midazolam      . pantoprazole (PROTONIX) IV  40 mg Intravenous QHS  . paricalcitol  6 mcg Intravenous Q M,W,F-HD  . sodium chloride  3 mL Intravenous Q12H  . sodium chloride      . sodium chloride      . vancomycin  750 mg Intravenous Q M,W,F-HD  . DISCONTD: levothyroxine  50 mcg Intravenous QAC breakfast   Continuous Infusions:  PRN Meds:.haloperidol lactate, DISCONTD: haloperidol lactate   Ht: 5\' 2"  (157.5 cm)  Wt: 141 lb 1.5 oz (64 kg)  Ideal Wt: 50 kg % Ideal Wt: 128%  Wt Readings from Last 12 Encounters:  02/20/11 141 lb 1.5 oz (64 kg)  12/23/10 165 lb 9.1 oz (75.1 kg)  11/29/10 173 lb  (78.472 kg)  11/16/09 168 lb (76.204 kg)  05/11/09 194 lb (87.998 kg)  10/28/08 188 lb (85.276 kg)  07/22/08 193 lb (87.544 kg)   Usual Wt: 165 lb % Usual Wt: 85%  Body mass index is 25.81 kg/(m^2).  Food/Nutrition Related Hx: unintentional weight loss PTA  Labs:  CMP     Component Value Date/Time   NA 141 02/20/2011 0821   K 5.2* 02/20/2011 0821   CL 98 02/20/2011 0821   CO2 22 02/20/2011 0821   GLUCOSE 84 02/20/2011 0821   BUN 54* 02/20/2011 0821   CREATININE 10.43* 02/20/2011 0821   CALCIUM 10.9* 02/20/2011 0821   PROT 6.8 02/17/2011 0801   ALBUMIN 3.0* 02/20/2011 0821   AST 15 02/17/2011 0801   ALT 8 02/17/2011 0801   ALKPHOS 78 02/17/2011 0801   BILITOT 0.5 02/17/2011 0801   GFRNONAA 3* 02/20/2011 0821   GFRAA 4* 02/20/2011 0821    CBG (last 3)   Basename 02/17/11 2247  GLUCAP 95    Intake/Output Summary (Last 24 hours) at 02/20/11 1532 Last data filed at 02/20/11 1200  Gross per 24 hour  Intake    130 ml  Output   3000 ml  Net  -2870 ml    Diet Order:  NPO  IVF:  None  Estimated Nutritional Needs:   Kcal: 1600-1800 Protein: 77-90 grams Fluid: 1.2 liters restriction  Patient did not pass stroke swallow screen on 2/15.  Is currently receiving an MRI.  Patient with severe malnutrition in the context of chronic illness given 15% weight loss in 2 months and visible muscle loss.  Noted progressive weight loss for the past few years.  NUTRITION DIAGNOSIS: -Inadequate oral intake (NI-2.1).  Status: Ongoing  RELATED TO: inability to eat with altered mental status and difficulty swallowing  AS EVIDENCED BY: NPO diet  MONITORING/EVALUATION(Goals): Goal:  Intake to meet at least 90% of estimated needs to prevent further loss of lean body mass.  Monitor for diet advancement, labs, weight trend.  EDUCATION NEEDS: -Education not appropriate at this time  INTERVENTION:  Recommend swallow evaluation with SLP before initiating PO diet.  If unable to safely take  po's, recommend placing an enteral feeding tube for TF:  Nepro at 10 ml/h, increase by 10 ml every 4 hours to goal of 40 ml/h to provide 1728 kcals, 78 grams protein, 696 ml free water daily.  Dietitian #:  [id number]  DOCUMENTATION CODES Per approved criteria  -Severe malnutrition in the context of chronic illness    Hettie Holstein 02/20/2011, 3:27 PM

## 2011-02-20 NOTE — Progress Notes (Addendum)
Subjective: Patient received hemodialysis in the morning. During dialysis the patient's blood pressure was elevated. After returning to the floor her blood pressure peaked at  208/97. Afterwhich, she was given iv labatelol to return her SBPs into the 160s, which is her baseline. Patient's daughter says patient's mental status has not improved since admission. The patient has intermittent periods of lucency, where she knows her name and location. Daughter says the patient is less agitated that at admission. Patient was disorientated and unable to communicate at the time of exam.   Objective: Vital signs in last 24 hours: Filed Vitals:   02/20/11 1012 02/20/11 1030 02/20/11 1100 02/20/11 1125  BP: 219/92 207/100 195/113 208/97  Pulse: 102 108 114 108  Temp:    97.8 F (36.6 C)  TempSrc:    Oral  Resp: 18 23 24 18   Height:      Weight:    64 kg (141 lb 1.5 oz)  SpO2:    99%   Physical Exam: Head: Normocephalic, Atraumatic.  Eyes: Pupils equally round and reactive to light bilaterally. Sclera non-injected and anicteric.  Mouth: Normal dentition. Moist mucus membranes.  Neck: Trachea midline. No cervical lymphadenopathy. No thyroidmegaly.  Pulmonary:  Breath sounds improved bilaterally. No wheezing, rales, or rhonchii.  Cardiac: Regular rate and rhythm. No murmurs, rubs, or gallops. Normal S1 and S2.  GI: Bowel sounds present. Non-tender to palpation. No hepatosplenomegaly. Well healed surgical scars.  Extremities: 1+ left radial pulse. 2+ right radial pulse. 2+ pedal pulses bilaterally. Patient's grafts are non-erythematous and non-tender.  Neuro: Moving all extremities.  Psych: Not orientated to person time or place. Difficult to arouse. Patient will briefly follow commands like sitting foreword, taking deep breaths, and gripping fingers.   Lab Results: Na 141, K 5.2, Cl 98, CO2 22, BUN 54, Cr 10.43, Glucose 84, Phos 8.5, Albumin 3.0 [pre-dialysis labs] WBCs 7.0, HGB 13.1, HCT 41.7, PLTs  120  Micro:  1. Urine Culture: No Growth 2. Blood Cultures: No Growth to date.   Studies/Results: Dg Chest Port 1 View (02/19/2011) IMPRESSION:  1.  Left IJ central line high in position.  This should be advanced at least 8 cm.  The study was made a "call report. 2. No pneumothorax. 3.  Cardiomegaly with developing interstitial edema. 4.  Improved aeration with persistent left base air space disease.  Original Report Authenticated By: Consuello Bossier, M.D.   Medications: I have reviewed the patient's current medications. Scheduled Meds:   . albuterol-ipratropium  2 puff Inhalation Q6H  . ceFEPime (MAXIPIME) IV  1 g Intravenous Q24H  . Fluticasone-Salmeterol  1 puff Inhalation Q12H  . hydrALAZINE  10 mg Intravenous Q6H  . labetalol      . labetalol  10 mg Intravenous Q6H  . labetalol  10 mg Intravenous Once  . labetalol  10 mg Intravenous Once  . levothyroxine  44 mcg Intravenous QAC breakfast  . LORazepam  1 mg Intravenous Once  . pantoprazole (PROTONIX) IV  40 mg Intravenous QHS  . paricalcitol  6 mcg Intravenous Q M,W,F-HD  . sodium chloride  3 mL Intravenous Q12H  . sodium chloride      . sodium chloride      . vancomycin  750 mg Intravenous Q M,W,F-HD  . DISCONTD: levothyroxine  50 mcg Intravenous QAC breakfast   Continuous Infusions:  PRN Meds:.haloperidol lactate, DISCONTD: haloperidol lactate, DISCONTD: haloperidol lactate, DISCONTD: heparin, DISCONTD: LORazepam, DISCONTD: LORazepam Assessment/Plan:  1. Altered Mental Status: Patient's mental status remains  altered, despite improvement in pulmonary exam and chest x-rays. Patient's antibiotic regimen is sufficient to cover for bacterial meningitis. Patient has not received Ativan in over 24-hour, yet she remains altered. Patient is less likely to be suffering from an encephalitis because her mentation is waxing and waning. However, we will evaluate this with an MRI and LP. Hypertensive encephalopathy could also be complicating  this patient. After dialysis her BP was documented to be 208/97. Noteably, she did not receive her AM blood pressure medications, but the degree of hypertension after dialysis is a bit unusual.  -- Advise nursing to be conservative with ativan.   -- MRI of the head -- Lumbar puncture with cell count, opening pressure, gram stain with culture, HSV PCR, and enterovirus PCR.  -- Bedside swallowing evaluation -- Adding Acyclovir 320mg  iv daily to cover HSV encephalitis   2. Health Care Associated Pneumonia: She was started on Vancomycin and Cefepime upon admission. The patient was only briefly febrile. She never had elevated WBCs. Her CXR has improved. Patient may have had pneumonia vs. mucous plugging given the rapid CXR improvement. Will continue broad pneumonia coverage, given her AMS -- Maintain Vanc/Cefepime   3. Hypertension: Patient has a long standing history of hypertension with a baseline SBP 160-170. Patient blood pressure is currently being managed with IV medications. Her blood pressure in the morning was elevated to 208/97 after dialysis. Improved with IV labatelol. Will continue to monitor.  -- Maintain Hydralazine 10mg  iv QID  -- Maintain Labetalol 10mg  iv QID  -- Consider po ACE inhibitor when patient can take PO.   4. End Stage Renal Disease: Patient has ESRD with MWF hemodialysis. Patient received HD this morning.  -- Being followed by nephrology, we appreciate their input.  -- Add Renvela 800--2 TID with meals once she can take po  5. R Renal Mass with History of left RCC: Spoke with radiology. They advise getting abdominal MRI, which patient has improved mental status, because abdominal MRI requires a high level of patient participation.  -- Consider abdominal MRI when patient's mental status improves.   6. Coronary Artery Disease: Patient has a history of CVA in 2003, CAD with a stent to her RCA in 2007, and iliac stenting. Given the patient's prior history, family history,  106 pack year history of smoking, ESRD, and uncontrolled hypertension; she is at high risk for further vascular insults. Her most recent lipid panel (11/29/10) was total cholesterol 145, LDL 80, TG 128, HDL 40.   7. Chronic Diastolic Heart Failure: last catheterization on 02/02/09 shows an ejection fraction of 55%. She appears euvolemic today. She has minimal JVD, and no lower extremity edema.   8. Hypothyroidism: Patient has a history of hypothyroidism. Her TSH on admission was 1.536.  -- Maintain Synthroid daily IV    LOS: 3 days   Wilmer Floor 02/20/2011, 1:07 PM  Addendum: Patient was seen, examined and reviewed with Wylene Men MS4.  Subjective: Patient continues to be significantly confused and altered from her baseline. Her daughter is at bedside and she confirms that patient's condition is unchanged from the day of admission. Patient received her dialysis today and her blood pressure is on the higher side possibly because she did not receive her scheduled antihypertensives.  Objective: Vital signs in last 24 hours: Filed Vitals:   02/20/11 1012 02/20/11 1030 02/20/11 1100 02/20/11 1125  BP: 219/92 207/100 195/113 208/97  Pulse: 102 108 114 108  Temp:    97.8 F (36.6 C)  TempSrc:    Oral  Resp: 18 23 24 18   Height:      Weight:    141 lb 1.5 oz (64 kg)  SpO2:    99%    Physical Exam: Gen: Patient is alert oriented x 1 Head: Normocephalic, Atraumatic.  Eyes: Pupils equally round and reactive to light bilaterally. Sclera non-injected and anicteric.  Mouth: Normal dentition. Moist mucus membranes.  Neck: Trachea midline. No cervical lymphadenopathy. No thyroidmegaly.  Pulmonary: Patient does not participate in exam. Breath sounds difficult to hear bilaterally. L>R dullness to percussion.  Cardiac: Regular rate and rhythm. No murmurs, rubs, or gallops. Normal S1 and S2.  GI: Bowel sounds present. Non-tender to palpation. No hepatosplenomegaly. Well healed  surgical scars.  Extremities: 1+ left radial pulse. 2+ right radial pulse. 2+ pedal pulses bilaterally. Left arm is cool to touch. No clubbing. Patient's grafts are non-erythematous and non-tender.  Neuro: Moving all extremities. Does not follow commands. Psych:  Not orientated to person time or place. Patient is babbling words which are difficult to understand  Lab Results: Patient's labs and imaging studies were reviewed.  Micro Results: Blood Cultures : No growth to date Urine Cultures Pending urine strep antigen is negative  Studies/Results: Dg Chest Portable 1 View (02/17/2011) at 1034 IMPRESSION: Vague opacities at the lung bases medially.  Pneumonia cannot be excluded.  Stable cardiomegaly.   Dg Chest 1 View (02/17/2011) at 1533 IMPRESSION: Enlargement of cardiac silhouette with pulmonary vascular congestion. Bibasilar consolidation.    Dg Chest Port 1 View (02/18/1011)  IMPRESSION: Overall worsening of asymmetric infiltrates or edema, left greater than right.    Ct Head Wo Contrast (02/18/2011) IMPRESSION: Limited examination due to excessive patient motion.  No significant change from the prior examination.  Old ischemic changes in the left cerebral cortex and cerebellar hemispheres.  Medications: I have reviewed the patient's current medications. Scheduled Meds:    . acyclovir  5 mg/kg Intravenous Q24H  . albuterol-ipratropium  2 puff Inhalation Q6H  . ceFEPime (MAXIPIME) IV  1 g Intravenous Q24H  . Fluticasone-Salmeterol  1 puff Inhalation Q12H  . hydrALAZINE  10 mg Intravenous Q6H  . labetalol      . labetalol  10 mg Intravenous Q6H  . labetalol  10 mg Intravenous Once  . labetalol  10 mg Intravenous Once  . levothyroxine  44 mcg Intravenous QAC breakfast  . LORazepam  1 mg Intravenous Once  . LORazepam  2 mg Intravenous Once  . pantoprazole (PROTONIX) IV  40 mg Intravenous QHS  . paricalcitol  6 mcg Intravenous Q M,W,F-HD  . sodium chloride  3 mL Intravenous Q12H    . sodium chloride      . sodium chloride      . vancomycin  750 mg Intravenous Q M,W,F-HD  . DISCONTD: levothyroxine  50 mcg Intravenous QAC breakfast    Assessment/Plan: 1. Health care associated Pneumonia: She was started on Vancomycin and Cefepime. Will continue broad pneumonia coverage. This diagnoses is presumptive at this time as he does not have any documentation of blood cultures or sputum cultures. 3 days of antibiotics and patient is still not improved. Chest x-ray reviewed from yesterday shows healing of left upper opacities.    2. Altered Mental Status: Most likely due to pneumonia. Stroke is less likely because patient is able to move allextremities. Role of medication is unclear. We will start her on acyclovir today and obtain and a lumbar puncture and MRI of her brain. Patient's condition  being similar to what it was 4 days ago at admission is a little bit concerning. We will try to obtain as much data from MRI and lumbar puncture to guide our treatment in next few days.   3. Hypertension: Patient has a long standing history of hypertension with a baseline SBP 160-170. Patient blood pressure is currently being managed with IV medications. Will continue to monitor.  -- Maintain Hydralazine 10mg  iv QID -- Maintain Labetalol 10mg  iv QID   4. End Stage Renal Disease: Agent is status post hemodialysis today. We'll continue to follow renal recommendations.   5 Coronary Artery Disease: Patient has a history of CVA in 2003, CAD with a stent to her RCA in 2007, and iliac stenting. Given the patient's prior history, family history, 106 pack year history of smoking, ESRD, and uncontrolled hypertension; she is at high risk for further vascular insults. Her most recent lipid panel (11/29/10) was total cholesterol 145, LDL 80, TG 128, HDL 40.   6. Chronic Diastolic Heart Failure: last catheterization on 02/02/09 shows an ejection fraction of 55%. She appears euvolemic today. She has minimal  JVD, and no lower extremity edema.   7. Hypothyroidism: Patient has a history of hypothyroidism. Her TSH on admission was 1.536. -- Maintain Synthroid daily IV  Lars Mage MD R3 Internal Medicine Resident Pager [phone] 2:08 PM Internal Medicine Teaching Service Attending Note Date: 02/20/2011  Patient name: Aaliya Maultsby  Medical record number: [id number]  Date of birth: 1945-09-05    This patient has been seen and discussed with the house staff. Please see their note for complete details. I concur with their findings with the following additions/corrections: Ms. Quinter's etiology of altered mental status is somewhat perplexing. On admit, it was thought to be related somewhat to possible medication mismanagement at home vs. Infection. She was started on empiric antibiotics of vancomycin and cefepime for presumed HCAP. Her mental status has been fluctuating, and in the last 24-48 hrs, she had received anxiolytics and sedatives for sundowning, which only complicates her current physical exam. This morning upon return from HD, she had sBP in 230s with responded to labetolol to sBP in the 160s. We will plan to do Brain MRI and LP to assess for viral encephalitis. Will add back acyclovir until CSF analysis and HSV PCR returns.   Kasen Adduci 02/20/2011, 4:00 PM

## 2011-02-20 NOTE — Progress Notes (Signed)
02/20/11 0100 When reviewing pt chart, pt found to have restraint order placed wrong. MD notified. MD states that he will review pt chart and call back with proper orders and plan.   Elisha Headland RN

## 2011-02-20 NOTE — Progress Notes (Addendum)
On HD via L upper arm AVG BP 151/107, Goal 3.5 L.  Hgb  14.2 K 5.5 yesterday Wt Readings from Last 3 Encounters:  02/20/11 66.8 kg (147 lb 4.3 oz)  12/23/10 75.1 kg (165 lb 9.1 oz)  11/29/10 78.472 kg (173 lb)   WEIGHT      DATE 68.7 kg 15 Feb 66.4  17 Feb 66.8  18 Feb (pre dialysis)  BP is still high--on IV labetalol and hydralazine. I'd suggest IV enalapril (1.25 mg IV)  with switch to po ACE inhib once she can take po  Phosphorus is 7.3. She'll need phosphate binder (Renvela 800--2 TID with meals once she can take po).   She will need MRI to look at  R kidney (prior hx L Nephrectomy for renal cell carcinoma) once she's stable and can be still.   Daughter ([name]) at bedside.  She says her mother was living by herself independently at home PTA.

## 2011-02-21 ENCOUNTER — Inpatient Hospital Stay (HOSPITAL_COMMUNITY): Payer: Medicare Other

## 2011-02-21 ENCOUNTER — Inpatient Hospital Stay (HOSPITAL_COMMUNITY): Admission: RE | Admit: 2011-02-21 | Payer: Medicare Other | Source: Ambulatory Visit

## 2011-02-21 LAB — LEGIONELLA ANTIGEN, URINE: Legionella Antigen, Urine: NEGATIVE

## 2011-02-21 LAB — URINALYSIS, ROUTINE W REFLEX MICROSCOPIC
Bilirubin Urine: NEGATIVE
Specific Gravity, Urine: 1.015 (ref 1.005–1.030)
Urobilinogen, UA: 0.2 mg/dL (ref 0.0–1.0)

## 2011-02-21 LAB — CSF CELL COUNT WITH DIFFERENTIAL: Tube #: 3

## 2011-02-21 LAB — RENAL FUNCTION PANEL
Albumin: 2.9 g/dL — ABNORMAL LOW (ref 3.5–5.2)
CO2: 25 mEq/L (ref 19–32)
Chloride: 99 mEq/L (ref 96–112)
Creatinine, Ser: 7.26 mg/dL — ABNORMAL HIGH (ref 0.50–1.10)
GFR calc Af Amer: 6 mL/min — ABNORMAL LOW (ref >90)
GFR calc non Af Amer: 5 mL/min — ABNORMAL LOW (ref >90)
Potassium: 4.2 mEq/L (ref 3.5–5.1)
Sodium: 140 mEq/L (ref 135–145)

## 2011-02-21 LAB — URINE MICROSCOPIC-ADD ON

## 2011-02-21 LAB — RAPID URINE DRUG SCREEN, HOSP PERFORMED
Amphetamines: NOT DETECTED
Barbiturates: NOT DETECTED
Benzodiazepines: NOT DETECTED

## 2011-02-21 LAB — PROTEIN AND GLUCOSE, CSF: Glucose, CSF: 50 mg/dL (ref 43–76)

## 2011-02-21 LAB — GRAM STAIN

## 2011-02-21 MED ORDER — LIDOCAINE-PRILOCAINE 2.5-2.5 % EX CREA
1.0000 "application " | TOPICAL_CREAM | CUTANEOUS | Status: DC | PRN
  Filled 2011-02-21: qty 5

## 2011-02-21 MED ORDER — SODIUM CHLORIDE 0.9 % IV SOLN: 100.0000 mL | INTRAVENOUS | Status: DC | PRN

## 2011-02-21 MED ORDER — PENTAFLUOROPROP-TETRAFLUOROETH EX AERO: 1.0000 "application " | INHALATION_SPRAY | CUTANEOUS | Status: DC | PRN

## 2011-02-21 MED ORDER — NEPRO/CARBSTEADY PO LIQD
237.0000 mL | ORAL | Status: DC | PRN
  Filled 2011-02-21: qty 237

## 2011-02-21 MED ORDER — ALTEPLASE 2 MG IJ SOLR
2.0000 mg | Freq: Once | INTRAMUSCULAR | Status: AC | PRN
  Filled 2011-02-21: qty 2

## 2011-02-21 MED ORDER — LORAZEPAM 2 MG/ML IJ SOLN
INTRAMUSCULAR | Status: AC
  Administered 2011-02-21: 1 mg via INTRAVENOUS
  Filled 2011-02-21: qty 1

## 2011-02-21 MED ORDER — HEPARIN SODIUM (PORCINE) 1000 UNIT/ML DIALYSIS
1000.0000 [IU] | INTRAMUSCULAR | Status: DC | PRN
  Filled 2011-02-21: qty 1

## 2011-02-21 MED ORDER — LIDOCAINE HCL (PF) 1 % IJ SOLN: 5.0000 mL | INTRAMUSCULAR | Status: DC | PRN

## 2011-02-21 MED ORDER — SEVELAMER CARBONATE 800 MG PO TABS
1600.0000 mg | ORAL_TABLET | Freq: Three times a day (TID) | ORAL | Status: DC
  Administered 2011-02-22 – 2011-02-27 (×12): 1600 mg via ORAL
  Filled 2011-02-21 (×22): qty 2

## 2011-02-21 NOTE — Progress Notes (Signed)
ANTIBIOTIC CONSULT NOTE - FOLLOW UP  Pharmacy Consult for Vancomycin/Cefepime/Acyclovir Indication: HCAP, r/o HSV encephalitis  Allergies  Allergen Reactions  . Iohexol Swelling and Other (See Comments)    1970s; passed out and had facial/tongue swelling.  Requires 13-hour prep with prednisone and benadryl  . Ampicillin Hives and Rash  . Meperidine Hcl Swelling and Rash    Makes tongue swell  . Morphine Rash  . Penicillins Rash  . Heparin     MDs told her not to take after reaction in ICU  . Pentazocine Lactate     Patient does not remember reaction to this med (Talwin).   . Phenytoin Other (See Comments)    Had reaction while in ICU; doesn't know.  MDs told her not to take ever again.  . Ace Inhibitors Rash    Patient Measurements: Height: 5\' 2"  (157.5 cm) Weight: 142 lb 13.7 oz (64.8 kg) IBW/kg (Calculated) : 50.1   Vital Signs: Temp: 98.8 F (37.1 C) (02/19 0704) Temp src: Oral (02/19 0704) BP: 98/44 mmHg (02/19 0400) Pulse Rate: 98  (02/19 0400) Intake/Output from previous day: 02/18 0701 - 02/19 0700 In: 130 [P.O.:120; IV Piggyback:10] Out: 3000  Intake/Output from this shift: Total I/O In: 360 [P.O.:360] Out: -   Labs:  Basename 02/21/11 1236 02/20/11 0821 02/20/11 0820 02/19/11 1128 02/19/11 0437  WBC -- -- 7.0 4.9 --  HGB -- -- 13.1 14.2 --  PLT -- -- 120* 118* --  LABCREA -- -- -- -- --  CREATININE 7.26* 10.43* -- -- 8.15*   Estimated Creatinine Clearance: 6.7 ml/min (by C-G formula based on Cr of 7.26).  Microbiology: Recent Results (from the past 720 hour(s))  CULTURE, BLOOD (ROUTINE X 2)     Status: Normal (Preliminary result)   Collection Time   02/17/11  6:42 PM      Component Value Range Status Comment   Specimen Description BLOOD HEMODIALYSIS GRAFT   Final    Special Requests BOTTLES DRAWN AEROBIC AND ANAEROBIC 10CC EACH   Final    Culture  Setup Time [id number]   Final    Culture     Final    Value:        BLOOD CULTURE RECEIVED NO  GROWTH TO DATE CULTURE WILL BE HELD FOR 5 DAYS BEFORE ISSUING A FINAL NEGATIVE REPORT   Report Status PENDING   Incomplete   CULTURE, BLOOD (ROUTINE X 2)     Status: Normal (Preliminary result)   Collection Time   02/17/11  6:50 PM      Component Value Range Status Comment   Specimen Description BLOOD HEMODIALYSIS GRAFT   Final    Special Requests BOTTLES DRAWN AEROBIC AND ANAEROBIC 10CC EACH   Final    Culture  Setup Time [id number]   Final    Culture     Final    Value:        BLOOD CULTURE RECEIVED NO GROWTH TO DATE CULTURE WILL BE HELD FOR 5 DAYS BEFORE ISSUING A FINAL NEGATIVE REPORT   Report Status PENDING   Incomplete   URINE CULTURE     Status: Normal   Collection Time   02/18/11  5:35 PM      Component Value Range Status Comment   Specimen Description URINE, CLEAN CATCH   Final    Special Requests NONE   Final    Culture  Setup Time [id number]   Final    Colony Count NO GROWTH   Final  Culture NO GROWTH   Final    Report Status 02/20/2011 FINAL   Final   GRAM STAIN     Status: Normal   Collection Time   02/21/11  8:44 AM      Component Value Range Status Comment   Specimen Description CSF   Final    Special Requests 2.7ML   Final    Gram Stain     Final    Value: CYTOSPIN     WBC PRESENT, PREDOMINANTLY PMN NO ORGANISMS SEEN   Report Status 02/21/2011 FINAL   Final     Anti-infectives     Start     Dose/Rate Route Frequency Ordered Stop   02/20/11 1600   acyclovir (ZOVIRAX) 320 mg in dextrose 5 % 100 mL IVPB        5 mg/kg  64 kg 106.4 mL/hr over 60 Minutes Intravenous Every 24 hours 02/20/11 1328     02/20/11 1200   vancomycin (VANCOCIN) 750 mg in sodium chloride 0.9 % 150 mL IVPB        750 mg 150 mL/hr over 60 Minutes Intravenous Every M-W-F (Hemodialysis) 02/17/11 1910     02/20/11 1200   gentamicin (GARAMYCIN) 140 mg in dextrose 5 % 50 mL IVPB  Status:  Discontinued        140 mg 107 mL/hr over 30 Minutes Intravenous Every M-W-F (Hemodialysis) 02/18/11  1016 02/18/11 1020   02/19/11 0600   acyclovir (ZOVIRAX) 500 mg in dextrose 5 % 100 mL IVPB  Status:  Discontinued        10 mg/kg  50.1 kg (Ideal) 110 mL/hr over 60 Minutes Intravenous Every 24 hours 02/18/11 0850 02/18/11 1003   02/18/11 1030   ceFEPIme (MAXIPIME) 1 g in dextrose 5 % 50 mL IVPB        1 g 100 mL/hr over 30 Minutes Intravenous Every 24 hours 02/17/11 1910     02/18/11 1030   gentamicin (GARAMYCIN) 180 mg in dextrose 5 % 50 mL IVPB  Status:  Discontinued        180 mg 109 mL/hr over 30 Minutes Intravenous  Once 02/18/11 1016 02/18/11 1020   02/17/11 2100   vancomycin (VANCOCIN) 1,500 mg in sodium chloride 0.9 % 500 mL IVPB  Status:  Discontinued        1,500 mg 250 mL/hr over 120 Minutes Intravenous To Hemodialysis 02/17/11 1910 02/17/11 2038   02/17/11 2100   ceFEPIme (MAXIPIME) 1 g in dextrose 5 % 50 mL IVPB  Status:  Discontinued        1 g 100 mL/hr over 30 Minutes Intravenous To Hemodialysis 02/17/11 1910 02/18/11 1007   02/17/11 2100   vancomycin (VANCOCIN) 1,500 mg in sodium chloride 0.9 % 250 mL IVPB        1,500 mg 250 mL/hr over 60 Minutes Intravenous  Once 02/17/11 2040 02/18/11 0000   02/17/11 1500   acyclovir (ZOVIRAX) 500 mg in dextrose 5 % 100 mL IVPB  Status:  Discontinued        10 mg/kg  50.1 kg (Ideal) 110 mL/hr over 60 Minutes Intravenous 3 times per day 02/17/11 1418 02/18/11 0850          Assessment: 66 yo F admitted with AMS.  On vancomycin (day #5) and cefepime (day#4) for HCAP and acyclovir (#2-although was not charted yesterday) for r/o HSV encephalitis.  Cultures have not grown anything.  Pt has ESRD, HD on MWF.  Goal of Therapy:  Target pre-HD  level 15-25, target post-HD level 5-15  Plan:  1.  Continue vancomycin 750 mg IV post HD 2.  Continue cefepime 1 gm IV daily 3.  Continue acyclovir 320mg  IV q 24hr 4.  F/U cultures, HD  Rolland Porter, Pharm.D., BCPS Clinical Pharmacist Pager: [phone]

## 2011-02-21 NOTE — Progress Notes (Signed)
Subjective: Daughter ([name]) spent night with the patient. Daughter feels the patient's mental status is improving. The patient is more easily aroused, can answer questions, follow commands, and speak in brief phrases. The patient has no complaints. Patient occasionally has a cough productive of brown sputum. Patient denies chest pain or shortness of breath.   At 4am the patient's blood pressure decreased to 98/44. Her morning labatelol and hydralazine were held. On rounds in the morning the patient's blood pressure was 170s/90s. Patient received hemodialysis yesterday and 3.0L of fluid were removed.   Objective: Vital signs in last 24 hours: Filed Vitals:   02/21/11 0000 02/21/11 0223 02/21/11 0400 02/21/11 0704  BP: 125/54  98/44   Pulse: 93  98   Temp: 99.4 F (37.4 C)  99.1 F (37.3 C) 98.8 F (37.1 C)  TempSrc: Oral  Oral Oral  Resp: 19  28   Height:      Weight:   64.8 kg (142 lb 13.7 oz)   SpO2: 96% 98% 100%    Physical Exam: Head: Normocephalic, Atraumatic.  Eyes: Pupils equally round and reactive to light bilaterally. Sclera non-injected and anicteric.  Neck: Trachea midline. No cervical lymphadenopathy. No thyroidmegaly.  Pulmonary: Clear to auscultation bilaterally. Breath sounds heard bilaterally. No wheezing, rales, or rhonchii.  Cardiac: Borderline tachycardia. Regular rhythm. No murmurs, rubs, or gallops. Normal S1 and S2.  GI: Bowel sounds present. Non-tender to palpation. No hepatosplenomegaly. Well healed surgical scars.  Extremities: Patient's grafts are non-erythematous and non-tender.  Neuro: Moving all extremities. Can grip fingers equivalently bilaterally. Toes down and in. Foot extension is equivalent bilaterally.  Psych: More easy to arouse. Orientated to person and place. When asked why she is in the hospital, patient replied "because I had a stroke".   CSF Results:  Glucose: 50, Total Protein 62, RBCs 1, WBCs 2, Neutrophils-few, Lymphs-rare,  Color-clear.  Urine Analysis:  Color-yellow, Appearance-cloudy, Specific Gravity 1.015, pH 8.5, Glucose 100, Bilirubin(-), Ketones(-), Protein 100, Urobilinogen 0.2, Nitrite (-), LE(large), RBCs 7-10, WBCs-many, Squamous Epithelial-many, Trichomonas (+).   Micro Results: 1. Urine Culture: Pending 2. Blood Cultures: No Growth to date.  Studies/Results: Mr Brain Wo Contrast (02/20/2011): FINDINGS: There is considerable motion degradation.  Diffusion imaging does not show any acute or subacute infarction. There are numerous old small vessel infarctions within the cerebellum.  There are old small vessel infarctions within the basal ganglia and thalami.  There are old cortical and subcortical infarctions in the left posterior frontal and parietal region.  No sign of mass lesion, hemorrhage, hydrocephalus or extra-axial collection.  No evidence of inflammatory sinus disease.  IMPRESSION: No evidence of acute or reversible process.  Old infarctions throughout the brain as outlined above.    Medications: I have reviewed the patient's current medications. Scheduled Meds:   . acyclovir  5 mg/kg Intravenous Q24H  . ipratropium  0.5 mg Nebulization Q6H   And  . albuterol  2.5 mg Nebulization Q6H  . albuterol      . ceFEPime (MAXIPIME) IV  1 g Intravenous Q24H  . Fluticasone-Salmeterol  1 puff Inhalation Q12H  . hydrALAZINE  10 mg Intravenous Q6H  . ipratropium      . labetalol      . labetalol  10 mg Intravenous Q6H  . labetalol  10 mg Intravenous Once  . labetalol  10 mg Intravenous Once  . levothyroxine  44 mcg Intravenous QAC breakfast  . lidocaine      . LORazepam  1 mg Intravenous Once  .  LORazepam  2 mg Intravenous Once  . midazolam      . pantoprazole (PROTONIX) IV  40 mg Intravenous QHS  . paricalcitol  6 mcg Intravenous Q M,W,F-HD  . sevelamer  1,600 mg Oral TID WC  . sodium chloride  3 mL Intravenous Q12H  . vancomycin  750 mg Intravenous Q M,W,F-HD  . DISCONTD:  albuterol-ipratropium  2 puff Inhalation Q6H   Continuous Infusions:  PRN Meds:.haloperidol lactate Assessment/Plan: 1. Altered Mental Status: Patient's mental status is improving. LP today not characteristic of an intracranial viral or bacterial infection. MRI yesterday is not consistent with any new intracranial edema or inflammation. Patient received 1mg  of Ativan for LP today, and was more altered afterwards.  -- Advise nursing to be conservative with ativan.  -- Allow daughter to advance diet to softs. If patient is unable to swallow, place an NGT.  -- Maintain Acyclovir 320mg  iv daily to cover HSV encephalitis. However consider discontinuing tomorrow because LP is not consistent with viral infection.    2. Health Care Associated Pneumonia: She was started on Vancomycin and Cefepime upon admission. The patient was only briefly febrile. She never had elevated WBCs. Her CXR has improved. Patient may have had pneumonia vs. mucous plugging given the rapid CXR improvement. If patient's mental status continues to improve will consider narrowing the antibiotic spectrum.  -- Maintain Vancomycin 750mg  qMWF after HD -- Maintain Cefepime 1G iv daily  3. Hypertension: Patient has a long standing history of hypertension with a baseline SBP 160-170. Patient blood pressure is currently being managed with IV medications. Her blood pressure has been very labile with a 24-hour range of 98/44 - 219/92. For now will continue IV management, but will convert back to PO if patient is able to tolerate a soft diet. Otherwise PO medications will be put down an NGT. Patient not on ACE inhibitor because she has a history of rash with that class of medication. Will continue to monitor and hold medication as necessary.  -- Maintain Hydralazine 10mg  iv QID  -- Maintain Labetalol 10mg  iv QID   4. End Stage Renal Disease: Patient has ESRD with MWF hemodialysis. Patient received HD yesterday, removing 3.0 L. Patient's  phosphorus is elevated at 8.5, will add Renvela -- Being followed by nephrology, we appreciate their input.  -- Maintain Paricalcitol iv qMWF during HD.  -- Add Renvela 800--2 TID with meals once she can take po.    5. Trichomonas on Urine Analysis: This is a suspect finding. The patient had a urineanalysis on 12/16 that did not reveal trichomonas. Possible that trichomonas infection has spread to urine in meantime.  -- Repeat UA -- If still positive Metronidazole therapy.   6. Hypothyroidism: Patient has a history of hypothyroidism. Her TSH on admission was 1.536.  -- Maintain Synthroid daily IV  7. R Renal Mass with History of left RCC: Spoke with radiology. They advise getting abdominal MRI, which patient has improved mental status, because abdominal MRI requires a high level of patient participation.  -- Consider abdominal MRI when patient's mental status improves.   8. Coronary Artery Disease: Patient has a history of CVA in 2003, CAD with a stent to her RCA in 2007, and iliac stenting. Given the patient's prior history, family history, 106 pack year history of smoking, ESRD, and uncontrolled hypertension; she is at high risk for further vascular insults. Her most recent lipid panel (11/29/10) was total cholesterol 145, LDL 80, TG 128, HDL 40.   9.  Chronic Diastolic Heart Failure: last catheterization on 02/02/09 shows an ejection fraction of 55%. She appears euvolemic today. She has minimal JVD, and no lower extremity edema.   LOS: 4 days   Wilmer Floor 02/21/2011, 11:30 AM  Addendum:  Patient was seen, examined and reviewed with Wylene Men MS4.   Subjective:  Patient doing better as compared to yesterday and wants to eat. The daughter is much happier with this improvement however she thinks she is still far from baseline. No other complaints overnight.   Objective:  BP 98/44  Pulse 98  Temp(Src) 98.8 F (37.1 C) (Oral)  Resp 28  Ht 5\' 2"  (1.575 m)  Wt 142  lb 13.7 oz (64.8 kg)  BMI 26.13 kg/m2  SpO2 100%   Physical Exam:  Gen: Patient is alert oriented x 2 Head: Normocephalic, Atraumatic.  Eyes: Pupils equally round and reactive to light bilaterally. Sclera non-injected and anicteric.  Mouth: Normal dentition. Moist mucus membranes.  Neck: Trachea midline. No cervical lymphadenopathy. No thyroidmegaly.  Pulmonary: Patient does not participate in exam. Breath sounds difficult to hear bilaterally. L>R dullness to percussion.  Cardiac: Regular rate and rhythm. No murmurs, rubs, or gallops. Normal S1 and S2.  GI: Bowel sounds present. Non-tender to palpation. No hepatosplenomegaly. Well healed surgical scars.  Extremities: 1+ left radial pulse. 2+ right radial pulse. 2+ pedal pulses bilaterally. Left arm is cool to touch. No clubbing. Patient's grafts are non-erythematous and non-tender.  Neuro: Moving all extremities. Does not follow commands.  Psych: Following commands and psych is somewhat appropriate but still far from baseline  Lab Results:  Patient's labs and imaging studies were reviewed.  UA s/o trichomonas  Micro Results:  Blood Cultures : No growth to date  Urine Cultures Pending   urine strep antigen is negative   Studies/Results:  Dg Chest Portable 1 View (02/17/2011) at 1034  IMPRESSION: Vague opacities at the lung bases medially. Pneumonia cannot be excluded. Stable cardiomegaly.  Dg Chest 1 View (02/17/2011) at 1533  IMPRESSION: Enlargement of cardiac silhouette with pulmonary vascular congestion. Bibasilar consolidation.  Dg Chest Port 1 View (02/18/1011)  IMPRESSION: Overall worsening of asymmetric infiltrates or edema, left greater than right.  Ct Head Wo Contrast (02/18/2011)  IMPRESSION: Limited examination due to excessive patient motion. No significant change from the prior examination. Old ischemic changes in the left cerebral cortex and cerebellar hemispheres.   Medications: I have reviewed the patient's current  medications.  Scheduled Meds:   .  acyclovir  5 mg/kg  Intravenous  Q24H   .  albuterol-ipratropium  2 puff  Inhalation  Q6H   .  ceFEPime (MAXIPIME) IV  1 g  Intravenous  Q24H   .  Fluticasone-Salmeterol  1 puff  Inhalation  Q12H   .  hydrALAZINE  10 mg  Intravenous  Q6H   .  labetalol      .  labetalol  10 mg  Intravenous  Q6H   .  labetalol  10 mg  Intravenous  Once   .  labetalol  10 mg  Intravenous  Once   .  levothyroxine  44 mcg  Intravenous  QAC breakfast   .  LORazepam  1 mg  Intravenous  Once   .  LORazepam  2 mg  Intravenous  Once   .  pantoprazole (PROTONIX) IV  40 mg  Intravenous  QHS   .  paricalcitol  6 mcg  Intravenous  Q M,W,F-HD   .  sodium chloride  3 mL  Intravenous  Q12H   .  sodium chloride      .  sodium chloride      .  vancomycin  750 mg  Intravenous  Q M,W,F-HD   .  DISCONTD: levothyroxine  50 mcg  Intravenous  QAC breakfast    Assessment/Plan:  1. Health care associated Pneumonia: She was started on Vancomycin and Cefepime. Will continue broad pneumonia coverage. This diagnoses is presumptive at this time as he does not have any documentation of blood cultures or sputum cultures. 4 days of antibiotics and patient is still not improved. Await clinical improvement/deterioration prior to decision re: change in therapy.  2. Altered Mental Status: Most likely due to pneumonia. Stroke is less likely because patient is able to move all extremities and MRI is negative. Role of medication is unclear. LP results pending at this time.  3. Hypertension: Patient has a long standing history of hypertension with a baseline SBP 160-170. Patient blood pressure is currently being managed with IV medications. Will continue to monitor.  -- Maintain Hydralazine 10mg  iv QID  -- Maintain Labetalol 10mg  iv QID   4. End Stage Renal Disease: We'll continue to follow renal recommendations.   5. CAD 6. Hypothyroidism  Lars Mage MD [facility] Internal Medicine Resident Pager  [phone] 1:59 PM

## 2011-02-21 NOTE — Procedures (Signed)
Procedure Note Lumbar Puncture  Pre-procedure Diagnosis: Altered Mental Status  Post-procedure Diagnosis: Altered Mental Status  Procedure Note:  The patient was sedated with 2 mg of ativan, and the lumbar puncture kit was prepared. The patient was draped and positioned on her side, and the area of insertion was cleaned with chloraprep.  The area was numbed using local lidocaine.  The patient continued to experience agitation and movement with lidocaine injection, so an additional 2 mg of ativan was given.  The LP needle was advanced, but no CSF returned.  The needle was repositioned and re-advanced three additional times, but no CSF returned.  The needle was withdrawn, and the area was bandaged.  The procedure was unsuccessful, and the patient will need IR Lumbar Puncture.  Signed, Janalyn Harder, PGY1

## 2011-02-21 NOTE — Progress Notes (Signed)
Int Med Night Intern:  Called for central line coiling under tegaderm.  CXRay showed poor positioning, line visibly coiled external to skin under tegaderm.  Peripheral IV was placed by IV team, so central line discontinued.

## 2011-02-21 NOTE — Progress Notes (Signed)
02/21/11 0000 Called MD about pt BP. Pt was supposed to receive apresoline and labetelol at 0000. Pt Bp is 125/54. MD states to hold BP meds and to check back 2 hrs later and to call if SBP > 140.   Will update with changes  Elisha Headland RN

## 2011-02-21 NOTE — Progress Notes (Signed)
Internal Medicine Teaching Service Attending Note Date: 02/21/2011  Patient name: [name]  Medical record number: [id number]  Date of birth: [birth year]    This patient has been seen and discussed with the house staff. Please see their note for complete details. I concur with their findings with the following additions/corrections: [name] has had waxing and waning mental status. She had MRI which did not show evidence of acute abnormality, other than old strokes. She underwent attempted LP yesterday without success, thus patient went to IR this morning for LP. She was initiated on acyclovir for HSV encephalitis, although this is thought to be less likely given her mentation is fluctuating. It is unclear how much of her variability in mental status is due to sedatives. She did receive ativan for LP yesterday as well as this morning for the IR procedure. Her daughter reports her mother somewhat improved, "hungry, wants to eat". The second issue to address is the patient's labile blood pressure. She was significantly elevated yesterday, post-dialysis, requiring addn IV doses. We will adjust her medicines since today she is on the lower end with sBP in 100-110s. Will fine tune her regimen as soon as more information returns from lab. Her CSF cell count show WBC within normal limit, thus unlikely infection. Will d/c acyclovir. Await hsv pcr results.  Judyann Munson 02/21/2011, 3:31 PM

## 2011-02-21 NOTE — Progress Notes (Addendum)
Subjective: Awake, more alert, daughter [name]) at bedside  Objective: Vital signs in last 24 hours: Blood pressure 98/44, pulse 98, temperature 98.8 F (37.1 C), temperature source Oral, resp. rate 28, height 5\' 2"  (1.575 m), weight 64.8 kg (142 lb 13.7 oz), SpO2 100.00%.   Intake/Output from previous day: 02/18 0701 - 02/19 0700 In: 130 [P.O.:120; IV Piggyback:10] Out: 3000  Intake/Output this shift:   Weight change: -2.8 kg (-6 lb 2.8 oz)   PHYSICAL EXAM General--0 x2 [facility], 2009, [name]), still with restraints and mittens Chest--clear, 3 lumen cath L IJ Heart--no rub Abd--nontender Extr--AVG patent L upper arm, no edema  Lab Results:   Lab 02/20/11 0821 02/19/11 0437 02/18/11 0825  NA 141 137 139  K 5.2* 5.5* 4.8  CL 98 98 99  CO2 22 24 25   BUN 54* 36* 19  CREATININE 10.43* 8.15* 6.09*  EGFR -- -- --  GLUCOSE 84 -- --  CALCIUM 10.9* 10.9* 10.8*  PHOS 8.5* 7.3* 5.9*      Basename 02/20/11 0820 02/19/11 1128  WBC 7.0 4.9  HGB 13.1 14.2  HCT 41.7 46.0  PLT 120* 118*     LP--1 RBC, 2 WBC   Assessment/Plan: 1. AMS- old strokes by CT, prob TME. May have some early underlying dementia per family.  2. ESRD - HDyesterday,  CXR shows improved PNA attempt 2-3 kg more as tolerated. Weight 64 kg post HD yesterday (EDW 71 kg as outpatient) 3. Hypertension- getting  IV meds--hydralazine 10 QID and Labetalol 10 IV QID.  I'd suggest switch to po if BP meds needed.  Was on Carvedilol 25 BID, amlodipine 10/d and hydralazine 25 BID PTA.  I don't think she needs all those with current BP 4. Fever/pulm infiltrates- better on vanc/cefipime  5. Metabolic bone disease - zemplar and binders , Ca and phos both HIGH.  Using low Ca bath in HD plus non-Ca phos binder 6. Hx of renal cell Ca--will need repeat MRI while in hospital  7. HO CVA 2003  8. Hx CAD/MI stent 2007  9. Hx of HIT 2007     LOS: 4 days   [name] F 02/21/2011,11:23 AM   .labalb

## 2011-02-21 NOTE — Procedures (Signed)
Fluoro guided LP via a left paramedian approach at L2/3.  Opening Pressure 16 cm H2O.  9ml of clear fluid sent for laboratory analysis.  No complications.

## 2011-02-22 ENCOUNTER — Inpatient Hospital Stay (HOSPITAL_COMMUNITY): Payer: Medicare Other

## 2011-02-22 ENCOUNTER — Encounter (HOSPITAL_COMMUNITY)
Admission: EM | Disposition: A | Discharge status: Skilled Nursing Facility | Payer: Self-pay | Source: Home / Self Care | Attending: Internal Medicine

## 2011-02-22 DIAGNOSIS — I2510 Atherosclerotic heart disease of native coronary artery without angina pectoris: Secondary | ICD-10-CM

## 2011-02-22 DIAGNOSIS — I2109 ST elevation (STEMI) myocardial infarction involving other coronary artery of anterior wall: Secondary | ICD-10-CM

## 2011-02-22 HISTORY — PX: LEFT HEART CATHETERIZATION WITH CORONARY ANGIOGRAM: SHX5451

## 2011-02-22 LAB — CARDIAC PANEL(CRET KIN+CKTOT+MB+TROPI): Troponin I: 0.33 ng/mL (ref <0.30)

## 2011-02-22 LAB — URINALYSIS, ROUTINE W REFLEX MICROSCOPIC
Bilirubin Urine: NEGATIVE
Glucose, UA: 100 mg/dL — AB
Ketones, ur: NEGATIVE mg/dL
Nitrite: NEGATIVE
Protein, ur: 100 mg/dL — AB
Specific Gravity, Urine: 1.015 (ref 1.005–1.030)
Urobilinogen, UA: 0.2 mg/dL (ref 0.0–1.0)
pH: 8.5 — ABNORMAL HIGH (ref 5.0–8.0)

## 2011-02-22 LAB — COMPREHENSIVE METABOLIC PANEL
ALT: 17 U/L (ref 0–35)
ALT: 20 U/L (ref 0–35)
AST: 25 U/L (ref 0–37)
AST: 27 U/L (ref 0–37)
Albumin: 2.7 g/dL — ABNORMAL LOW (ref 3.5–5.2)
Alkaline Phosphatase: 61 U/L (ref 39–117)
Alkaline Phosphatase: 59 U/L (ref 39–117)
CO2: 23 mEq/L (ref 19–32)
Calcium: 11.1 mg/dL — ABNORMAL HIGH (ref 8.4–10.5)
Chloride: 96 mEq/L (ref 96–112)
GFR calc Af Amer: 5 mL/min — ABNORMAL LOW (ref >90)
GFR calc Af Amer: 11 mL/min — ABNORMAL LOW (ref >90)
GFR calc non Af Amer: 4 mL/min — ABNORMAL LOW (ref >90)
Glucose, Bld: 101 mg/dL — ABNORMAL HIGH (ref 70–99)
Glucose, Bld: 128 mg/dL — ABNORMAL HIGH (ref 70–99)
Potassium: 3.9 mEq/L (ref 3.5–5.1)
Potassium: 3.8 mEq/L (ref 3.5–5.1)
Sodium: 135 mEq/L (ref 135–145)
Sodium: 131 mEq/L — ABNORMAL LOW (ref 135–145)
Total Protein: 6.5 g/dL (ref 6.0–8.3)

## 2011-02-22 LAB — CBC
Hemoglobin: 12.8 g/dL (ref 12.0–15.0)
Hemoglobin: 13.1 g/dL (ref 12.0–15.0)
MCH: 25.2 pg — ABNORMAL LOW (ref 26.0–34.0)
MCHC: 31.2 g/dL (ref 30.0–36.0)
Platelets: 97 10*3/uL — ABNORMAL LOW (ref 150–400)
Platelets: 80 10*3/uL — ABNORMAL LOW (ref 150–400)
RBC: 5.07 MIL/uL (ref 3.87–5.11)
RBC: 5.21 MIL/uL — ABNORMAL HIGH (ref 3.87–5.11)
WBC: 5.1 10*3/uL (ref 4.0–10.5)

## 2011-02-22 LAB — URINE MICROSCOPIC-ADD ON

## 2011-02-22 LAB — URINE CULTURE: Culture  Setup Time: 201302190731

## 2011-02-22 LAB — LIPID PANEL
LDL Cholesterol: 87 mg/dL (ref 0–99)
Total CHOL/HDL Ratio: 3.6 RATIO
VLDL: 28 mg/dL (ref 0–40)

## 2011-02-22 LAB — PROTIME-INR
INR: 1.25 (ref 0.00–1.49)
Prothrombin Time: 16 seconds — ABNORMAL HIGH (ref 11.6–15.2)

## 2011-02-22 LAB — MRSA PCR SCREENING: MRSA by PCR: NEGATIVE

## 2011-02-22 SURGERY — LEFT HEART CATHETERIZATION WITH CORONARY ANGIOGRAM
Anesthesia: LOCAL

## 2011-02-22 MED ORDER — LEVOTHYROXINE SODIUM 88 MCG PO TABS
88.0000 ug | ORAL_TABLET | Freq: Every day | ORAL | Status: DC
  Administered 2011-02-23 – 2011-02-27 (×5): 88 ug via ORAL
  Filled 2011-02-22 (×7): qty 1

## 2011-02-22 MED ORDER — PARICALCITOL 5 MCG/ML IV SOLN
INTRAVENOUS | Status: AC
  Administered 2011-02-22: 6 ug via INTRAVENOUS
  Filled 2011-02-22: qty 2

## 2011-02-22 MED ORDER — ASPIRIN 81 MG PO CHEW
324.0000 mg | CHEWABLE_TABLET | Freq: Once | ORAL | Status: AC
  Administered 2011-02-22: 324 mg via ORAL

## 2011-02-22 MED ORDER — ASPIRIN 81 MG PO CHEW
CHEWABLE_TABLET | ORAL | Status: AC
  Filled 2011-02-22: qty 4

## 2011-02-22 MED ORDER — HEPARIN (PORCINE) IN NACL 2-0.9 UNIT/ML-% IJ SOLN
INTRAMUSCULAR | Status: AC
  Filled 2011-02-22: qty 2000

## 2011-02-22 MED ORDER — ASPIRIN 81 MG PO CHEW
81.0000 mg | CHEWABLE_TABLET | Freq: Every day | ORAL | Status: DC
  Administered 2011-02-23 – 2011-02-27 (×5): 81 mg via ORAL
  Filled 2011-02-22 (×5): qty 1

## 2011-02-22 MED ORDER — PANTOPRAZOLE SODIUM 40 MG PO TBEC
40.0000 mg | DELAYED_RELEASE_TABLET | Freq: Every day | ORAL | Status: DC
  Administered 2011-02-23 – 2011-02-27 (×5): 40 mg via ORAL
  Filled 2011-02-22 (×5): qty 1

## 2011-02-22 MED ORDER — CARVEDILOL 25 MG PO TABS
25.0000 mg | ORAL_TABLET | Freq: Two times a day (BID) | ORAL | Status: DC
Start: 2011-02-22 — End: 2011-02-27
  Administered 2011-02-22 – 2011-02-26 (×9): 25 mg via ORAL
  Filled 2011-02-22 (×13): qty 1

## 2011-02-22 MED ORDER — NITROGLYCERIN 0.2 MG/ML ON CALL CATH LAB
INTRAVENOUS | Status: AC
  Filled 2011-02-22: qty 1

## 2011-02-22 MED ORDER — BIVALIRUDIN 250 MG IV SOLR
INTRAVENOUS | Status: AC
  Filled 2011-02-22: qty 250

## 2011-02-22 MED ORDER — METHYLPREDNISOLONE SODIUM SUCC 125 MG IJ SOLR
INTRAMUSCULAR | Status: AC
  Filled 2011-02-22: qty 2

## 2011-02-22 MED ORDER — CEFUROXIME AXETIL 500 MG PO TABS
500.0000 mg | ORAL_TABLET | Freq: Two times a day (BID) | ORAL | Status: AC
  Administered 2011-02-23 – 2011-02-24 (×4): 500 mg via ORAL
  Filled 2011-02-22 (×6): qty 1

## 2011-02-22 MED ORDER — DIPHENHYDRAMINE HCL 50 MG/ML IJ SOLN
INTRAMUSCULAR | Status: AC
  Filled 2011-02-22: qty 1

## 2011-02-22 MED ORDER — NEPRO/CARBSTEADY PO LIQD
237.0000 mL | Freq: Three times a day (TID) | ORAL | Status: DC
  Administered 2011-02-23 – 2011-02-26 (×8): 237 mL via ORAL
  Filled 2011-02-22 (×18): qty 237

## 2011-02-22 MED ORDER — HALOPERIDOL LACTATE 5 MG/ML IJ SOLN
5.0000 mg | Freq: Four times a day (QID) | INTRAMUSCULAR | Status: DC | PRN
  Administered 2011-02-22 (×2): 5 mg via INTRAVENOUS
  Filled 2011-02-22: qty 1

## 2011-02-22 MED ORDER — MIDAZOLAM HCL 2 MG/2ML IJ SOLN
INTRAMUSCULAR | Status: AC
  Filled 2011-02-22: qty 2

## 2011-02-22 MED ORDER — SIMVASTATIN 20 MG PO TABS
20.0000 mg | ORAL_TABLET | Freq: Every day | ORAL | Status: DC
  Administered 2011-02-23 – 2011-02-26 (×4): 20 mg via ORAL
  Filled 2011-02-22 (×7): qty 1

## 2011-02-22 MED ORDER — LIDOCAINE HCL (PF) 1 % IJ SOLN
INTRAMUSCULAR | Status: AC
  Filled 2011-02-22: qty 30

## 2011-02-22 MED ORDER — HYDRALAZINE HCL 25 MG PO TABS
25.0000 mg | ORAL_TABLET | Freq: Two times a day (BID) | ORAL | Status: DC
  Administered 2011-02-22 – 2011-02-24 (×5): 25 mg via ORAL
  Filled 2011-02-22 (×7): qty 1

## 2011-02-22 MED ORDER — FAMOTIDINE IN NACL 20-0.9 MG/50ML-% IV SOLN
INTRAVENOUS | Status: AC
  Filled 2011-02-22: qty 50

## 2011-02-22 MED ORDER — AMLODIPINE BESYLATE 10 MG PO TABS
10.0000 mg | ORAL_TABLET | Freq: Every day | ORAL | Status: DC
  Administered 2011-02-23 – 2011-02-27 (×5): 10 mg via ORAL
  Filled 2011-02-22 (×6): qty 1

## 2011-02-22 NOTE — Consults (Signed)
CARDIOLOGY CONSULT NOTE  Patient ID: [name] MRN: [id number] DOB/AGE: February 27, 1945 66 y.o.  Admit date: 02/17/2011  Primary Cardiologist Rollene Rotunda, MD Reason for Consultation anterior ST elevation myocardial infarction.  HPI: This is a 66 year old female with known history of coronary artery disease status post RCA PCI. She has extensive medical problems that include end-stage renal disease on hemodialysis as well as peripheral arterial disease. She was initially admitted for mental status changes and confusion which gradually improved. She started getting physical therapy today and during that she had significant weakness and almost fell on the floor. She did not feel well and had significant tachycardia. She reported dyspnea and possibly chest pain. She had an ECG done which showed anterior and anterolateral ST elevation with biphasic T waves. Cardiac catheterization was recommended. Initially, the patient refused the procedure. I spoke with the patient and her daughter [name] who is her power of attorney. They subsequently agreed to proceed with the procedure. I also have to clarify the issue that she would be a full code during cardiac catheterization although she does have a DO NOT RESUSCITATE status. The patient's mental status has been waxing and waning since admission.    Review of systems complete and found to be negative unless listed above   Past Medical History  Diagnosis Date  . CAD (coronary artery disease)     stent to RCA  . CVA (cerebral infarction) 2003    no apparent residual  . Renal failure     related to hypertension  . Hypothyroidism   . PVD (peripheral vascular disease)   . Hyperlipidemia   . Positive PPD     completed rifampin  . Diastolic congestive heart failure   . Renal insufficiency   . Hypertension   . COPD (chronic obstructive pulmonary disease) t  . Cancer     clear cell cancer, kidney  . Complication of anesthesia 12/2010    pt is very  confused, with AMS with anesthesia    Family History  Problem Relation Age of Onset  . Heart disease Father   . Hypertension Mother   . Dementia Mother   . Coronary artery disease Sister     History   Social History  . Marital Status: Legally Separated    Spouse Name: N/A    Number of Children: 2  . Years of Education: N/A   Occupational History  . Retired Engineer, civil (consulting)    Social History Main Topics  . Smoking status: Current Everyday Smoker -- 2.0 packs/day for 53 years    Types: Cigarettes  . Smokeless tobacco: Not on file   Comment: 1/2 ppd since age 30  . Alcohol Use: No  . Drug Use: No  . Sexually Active: Not Currently    Birth Control/ Protection: Post-menopausal   Other Topics Concern  . Not on file   Social History Narrative  . No narrative on file    Past Surgical History  Procedure Date  . Abdominal hysterectomy   . Cholecystectomy   . D&cs   . Right ankle repair   . Left nephrectomy   . Vascular surgery 11/2010    graft inserted to left arm     Prescriptions prior to admission  Medication Sig Dispense Refill  . acyclovir (ZOVIRAX) 800 MG tablet Take 1 tablet by mouth Daily.      [name] albuterol-ipratropium (COMBIVENT) 18-103 MCG/ACT inhaler Inhale 2 puffs into the lungs every 6 (six) hours as needed. For breathing      .  amLODipine (NORVASC) 10 MG tablet Take 10 mg by mouth daily.       [name] aspirin EC 81 MG tablet Take 81 mg by mouth daily.        . carvedilol (COREG) 25 MG tablet Take 25 mg by mouth 2 (two) times daily with a meal.       . citalopram (CELEXA) 20 MG tablet Take 20 mg by mouth daily.       [name] DEXILANT 60 MG capsule Take 60 mg by mouth daily.       . Fluticasone-Salmeterol (ADVAIR) 100-50 MCG/DOSE AEPB Inhale 1 puff into the lungs every 12 (twelve) hours.      . hydrALAZINE (APRESOLINE) 25 MG tablet Take 25 mg by mouth 2 (two) times daily.       [name] HYDROcodone-acetaminophen (VICODIN) 5-500 MG per tablet Take 1 tablet by mouth every 6 (six) hours as  needed. For pain.       [name] levothyroxine (SYNTHROID, LEVOTHROID) 88 MCG tablet Take 88 mcg by mouth daily.        [name] LORazepam (ATIVAN) 2 MG tablet Take 2 mg by mouth every 6 (six) hours as needed. For anxiety      . multivitamin (RENA-VIT) TABS tablet Take 1 tablet by mouth daily.        [name] NITROSTAT 0.4 MG SL tablet Place 0.4 mg under the tongue every 5 (five) minutes as needed. For chest pain.      [name] oxyCODONE-acetaminophen (PERCOCET) 5-325 MG per tablet Take 1 tablet by mouth every 6 (six) hours as needed. For pain      . polyethylene glycol powder (GLYCOLAX/MIRALAX) powder Take 17 g by mouth daily as needed. For constipation.      . pravastatin (PRAVACHOL) 40 MG tablet Take 80 mg by mouth at bedtime.        . promethazine-codeine (PHENERGAN WITH CODEINE) 6.25-10 MG/5ML syrup Take 5 mLs by mouth every 6 (six) hours as needed. For cough      . sevelamer (RENVELA) 800 MG tablet Take 1,600 mg by mouth 3 (three) times daily with meals.          Physical Exam: Constitutional: She is oriented to person but she does not have a good understanding of her medical conditions. HENT: No nasal discharge.  Head: Normocephalic and atraumatic.  Eyes: Pupils are equal and round. Right eye exhibits no discharge. Left eye exhibits no discharge.  Neck: Normal range of motion. Neck supple. No JVD present. No thyromegaly present.  Cardiovascular: Normal rate, regular rhythm, normal heart sounds. Exam reveals no gallop and no friction rub. No murmur heard.  Pulmonary/Chest: Effort normal and breath sounds normal. No stridor. No respiratory distress. She has no wheezes. She has no rales. She exhibits no tenderness.  Abdominal: Soft. Bowel sounds are normal. She exhibits no distension. There is no tenderness. There is no rebound and no guarding.  Musculoskeletal: Normal range of motion. She exhibits no edema and no tenderness.  Skin: Skin is warm and dry. No rash noted. She is not diaphoretic. No erythema. No pallor.       Labs:   Lab Results  Component Value Date   WBC 4.7 02/22/2011   HGB 13.1 02/22/2011   HCT 42.0 02/22/2011   MCV 80.6 02/22/2011   PLT 80* 02/22/2011    Lab 02/22/11 1815  NA 131*  K 3.8  CL 93*  CO2 27  BUN 16  CREATININE 4.48*  CALCIUM 11.0*  PROT 6.5  BILITOT  0.4  ALKPHOS 59  ALT 20  AST 27  GLUCOSE 128*   Lab Results  Component Value Date   CKTOTAL 83 02/22/2011   CKMB 6.3* 02/22/2011   TROPONINI <0.30 02/22/2011    Lab Results  Component Value Date   CHOL 159 02/22/2011   CHOL 145 11/29/2010   CHOL 166 07/24/2008   Lab Results  Component Value Date   HDL 44 02/22/2011   HDL 39.50 11/29/2010   HDL 49.10 07/24/2008   Lab Results  Component Value Date   LDLCALC 87 02/22/2011   LDLCALC 80 11/29/2010   LDLCALC 97 07/24/2008   Lab Results  Component Value Date   TRIG 140 02/22/2011   TRIG 128.0 11/29/2010   TRIG 98.0 07/24/2008   Lab Results  Component Value Date   CHOLHDL 3.6 02/22/2011   CHOLHDL 4 11/29/2010   CHOLHDL 3 07/24/2008   No results found for this basename: LDLDIRECT       ASSESSMENT AND PLAN:   1. stress-induced cardiomyopathy: The patient had an emergent cardiac catheterization for suspected anterior ST elevation myocardial infarction. However, the catheterization showed no evidence of obstructive coronary artery disease or any evidence of plaque rupture. Her left ventricular angiography was suggestive of stress-induced cardiomyopathy. However, this can be also seen in the setting of intracranial hemorrhage. Thus, a stat CT head was ordered. This was communicated with the primary team. Continue treatment with Coreg and aspirin.  2. mental status changes: Present since admission and still of unclear etiology. This is being followed by the primary team. 3. end-stage renal disease on hemodialysis.   Signed: Lorine Bears, MD, [facility] 02/22/2011, 7:33 PM

## 2011-02-22 NOTE — Progress Notes (Signed)
Clinical Social Work-CSW met with pt and daughter at bedside-Pt daughter in agreement with ST SN[facility]-CSW explained process of placement including insurance and will facilitate FL2 completion. CSW will initiate bed search and follow for d/c planning-[name]-MSW, [phone]

## 2011-02-22 NOTE — Progress Notes (Addendum)
Dr. Yaakov Guthrie called with CXR results. New orders received to not access R IJ. Will not access IJ. IV team paged for PIV access. Will continue to monitor.   Sarayu Prevost, [place] Men[place] RN

## 2011-02-22 NOTE — Op Note (Signed)
Cardiac Catheterization Procedure Note  Name: [name] MRN: [id number] DOB: [birth year]  Procedure: Left Heart Cath, Selective Coronary Angiography, LV angiography  Indication: Suspected acute anterior myocardial infarction. This is a 66 year old female with known history of coronary artery disease status post RCA PCI. She has extensive medical problems that include end-stage renal disease on hemodialysis as well as peripheral arterial disease. She was initially admitted for mental status changes and confusion which gradually improved. She started getting physical therapy today and during that she had significant weakness and almost fell on the floor. She did not feel well. She reported dyspnea and possibly chest pain. She had an ECG done which showed inferior and anterolateral ST elevation with biphasic T waves. Cardiac catheterization was recommended. Initially, the patient refused the procedure. I spoke with the patient and her daughter [name] who is her power of attorney. They subsequently agreed to proceed with the procedure. I also have to clarify the issue that she would be a full code during cardiac catheterization although she does have a DO NOT RESUSCITATE status.   Medications:  Sedation:  1 mg IV Versed,   Contrast:  85 mL Omnipaque  Procedural details: The right groin was prepped, draped, and anesthetized with 1% lidocaine. Using modified Seldinger technique, a 6 French sheath was introduced into the right femoral artery. Standard Judkins catheters were used for coronary angiography and left ventriculography. An AR-1 catheter was used to engage the right coronary artery Catheter exchanges were performed over a guidewire. There were no immediate procedural complications. The patient was transferred to the post catheterization recovery area for further monitoring.  This was a difficult procedure as the patient continued to move her right leg and did not follow instructions.   Procedural  Findings:  Hemodynamics: AO:  132/57   mmHg LV:  127/1    mmHg LVEDP: 4  mmHg  Coronary angiography: Coronary dominance: Right   Left Main:  Normal in size with mild 10-20% distal stenosis.  Left Anterior Descending (LAD):  Mildly calcified with mild diffuse atherosclerosis but no evidence of obstructive disease.  1st diagonal (D1):  Normal in size with 30% proximal disease.  2nd diagonal (D2):  Small in size.  3rd diagonal (D3):  Small in size.  Circumflex (LCx):  Normal in size and mildly calcified. There is mild diffuse atherosclerosis but no evidence of obstructive disease.  1st obtuse marginal:  Very small in size.  2nd obtuse marginal:  Medium in size with minor irregularities.  3rd obtuse marginal:  Normal in size and free of significant disease.   Right Coronary Artery: The vessel is normal in size and appears to be dominant. An ostial stent is noted which appears to be protruding into the aorta at least a few millimeters. This made engaging the vessel selectively almost impossible. I was able to get nonselective shots with an AR-1 catheter. This showed that the vessel is patent with no significant in-stent restenosis. There seems to be a 30- 40% stenosis distal to the stent but otherwise no evidence of obstructive disease.    Left ventriculography: Left ventricular systolic function is moderately reduced , LVEF is estimated at 35-40 %. There is akinesis of the distal anterior, apex and distal inferior wall. The appearance is suggestive of stress-induced cardiomyopathy.  Final Conclusions:   1. Patent RCA stent with no evidence of obstructive coronary artery disease. 2. Moderately reduced LV systolic function with an estimated ejection fraction 35-40% with an appearance suggestive of stress-induced cardiomyopathy. 3. Low left  ventricular end-diastolic pressure.   Recommendations: The patient does not have evidence of obstructive coronary artery disease to explain her ECG  changes. Her ECG changes and left ventricular angiography are suggestive of stress-induced cardiomyopathy. However, given her continued mental status changes, a CT scan of the head is recommended to rule out intracranial bleed as a cause of her current presentation. Continue treatment with Coreg and consider an echocardiogram in few days.   Lorine Bears MD, [facility] 02/22/2011, 6:26 PM

## 2011-02-22 NOTE — Progress Notes (Signed)
Pt's R IJ is coiled and the dressign is wet. Dr. Yaakov Guthrie called. New orders received for CXR. Will continue to monitor.  Peta Peachey, [place] Men[place] RN

## 2011-02-22 NOTE — Progress Notes (Signed)
Nutrition Follow-up  Diet Order:  Regular  Meds: Scheduled Meds:   . ipratropium  0.5 mg Nebulization Q6H   And  . albuterol  2.5 mg Nebulization Q6H  . ceFEPime (MAXIPIME) IV  1 g Intravenous Q24H  . Fluticasone-Salmeterol  1 puff Inhalation Q12H  . hydrALAZINE  10 mg Intravenous Q6H  . labetalol  10 mg Intravenous Q6H  . labetalol  10 mg Intravenous Once  . labetalol  10 mg Intravenous Once  . levothyroxine  44 mcg Intravenous QAC breakfast  . LORazepam  2 mg Intravenous Once  . pantoprazole (PROTONIX) IV  40 mg Intravenous QHS  . paricalcitol  6 mcg Intravenous Q M,W,F-HD  . sevelamer  1,600 mg Oral TID WC  . sodium chloride  3 mL Intravenous Q12H  . vancomycin  750 mg Intravenous Q M,W,F-HD  . DISCONTD: acyclovir  5 mg/kg Intravenous Q24H   Continuous Infusions:  PRN Meds:.sodium chloride, sodium chloride, alteplase, feeding supplement (NEPRO CARB STEADY), haloperidol lactate, heparin, lidocaine, lidocaine-prilocaine, pentafluoroprop-tetrafluoroeth, DISCONTD: haloperidol lactate  Labs:  CMP     Component Value Date/Time   NA 135 02/22/2011 0809   K 3.9 02/22/2011 0809   CL 96 02/22/2011 0809   CO2 23 02/22/2011 0809   GLUCOSE 101* 02/22/2011 0809   BUN 42* 02/22/2011 0809   CREATININE 8.76* 02/22/2011 0809   CALCIUM 11.1* 02/22/2011 0809   PROT 6.6 02/22/2011 0809   ALBUMIN 2.7* 02/22/2011 0809   AST 25 02/22/2011 0809   ALT 17 02/22/2011 0809   ALKPHOS 61 02/22/2011 0809   BILITOT 0.4 02/22/2011 0809   GFRNONAA 4* 02/22/2011 0809   GFRAA 5* 02/22/2011 0809  Phosphorous 6.7 (high)  Intake/Output Summary (Last 24 hours) at 02/22/11 1446 Last data filed at 02/22/11 1202  Gross per 24 hour  Intake    243 ml  Output   2816 ml  Net  -2573 ml    Weight Status:  61.8 kg (down from 64 kg 2/18 with negative fluid balance)  Noted EDW is 71 kg as outpatient.  Current weight is 13% below EDW.  Patient only consumed the cake and a few bites of salad on her lunch tray  today.  Re-estimated needs:  1600-1800 kcals, 77-90 grams protein daily  Nutrition Dx:  Inadequate oral intake now related to altered mental status as evidenced by minimal intake of meals.  Goal:  Intake to meet at least 90% of estimated needs to prevent further loss of lean body mass, unmet.  Intervention:  Nepro TID between meals to maximize oral intake.  Monitor:  PO intake, labs, weight trend.   Hettie Holstein Pager #:  [phone]

## 2011-02-22 NOTE — Progress Notes (Signed)
On HD via l upper arm AVG BP 140/71 Goal 3.3.  Hgb pending K pending.  Calcium pending-- had been high--on low calcium dialysate  Mental status not as good today. Wt Readings from Last 3 Encounters:  02/22/11 65.3 kg (143 lb 15.4 oz)  12/23/10 75.1 kg (165 lb 9.1 oz)  11/29/10 78.472 kg (173 lb)

## 2011-02-22 NOTE — Progress Notes (Signed)
Internal Medicine Teaching Service Attending Note Date: 02/22/2011  Patient name: [name]  Medical record number: [id number]  Date of birth: [birth year]    This patient has been seen and discussed with the house staff. Please see their note for complete details. I concur with their findings with the following additions/corrections: at 4:30 patient was noted to have ST elevations on telemetry in setting of tachycardia, which was confirmed on 12 lead EKG, STEMI alert was initiated, patient received 4 baby aspirin and started on bivalirudin for anticoagulation in preparation to be seen by cardiology for heart catherization. The patient denied having chest pain and arrhythmia was thought to be provoked by her participation with physical therapy. Her vital reflected her HR in the 90s but sBP in the 100-110s. Beta blockade was held due to low sBP. We await to hear results from heart catherization.  Judyann Munson 02/22/2011, 9:39 PM

## 2011-02-22 NOTE — Progress Notes (Signed)
Subjective: Spoke with patient's daughter Rubye Oaks in the morning. Daughter believes the patient is more agitated compared to yesterday. Daughter says the patient has had some uncontrollable arm movements. Daughter says the patient had expressed insight into her disease, but patient did not know what was going on.   We visited the patient in dialysis. She was easily aroused, but confused. The patient answered questions, and could make brief statements. She denied any pain or shortness of breath.   Objective: Vital signs in last 24 hours: Filed Vitals:   02/22/11 1030 02/22/11 1031 02/22/11 1040 02/22/11 1041  BP: 120/57  76/43 85/27  Pulse: 96 96 99 79  Temp:      TempSrc:      Resp: 23 16 18 16   Height:      Weight:      SpO2: 100% 100% 100% 100%   Physical Exam: Head: Normocephalic, Atraumatic.  Eyes: Pupils equally round and reactive to light bilaterally. Sclera non-injected and anicteric.  Neck: Trachea midline. No cervical lymphadenopathy. No thyroidmegaly.  Pulmonary: Clear to auscultation bilaterally. Breath sounds heard bilaterally. No wheezing, rales, or rhonchii.  Cardiac: Borderline tachycardia. Regular rhythm. No murmurs, rubs, or gallops. Normal S1 and S2.  Extremities: Patient's grafts are non-erythematous and non-tender.  Neuro: Moving all extremities. Can grip fingers equivalently bilaterally. Toes down and in. Foot extension is equivalent bilaterally. Patient does not express any involuntary arm, eye, mouth, or tongue movements. Patient is not rigid.  Psych: Easy to arouse. Only orientated to person, but not time, or place.   Lab Results: Na 135, K 3.9, Cl 96, CO2 23, BUN 42, Cr 8.76, Ca 11.1 WBCs 5.1, HGB 12.8, PLTs 97  Studies/Results: Dg Chest Port 1 View (02/21/11) IMPRESSION: Left IJ central line remains high, tip just above the thoracic inlet.  This should be advanced or removed and replaced.  Stable chest.  Dg Fluoro Guide Lumbar Puncture (02/21/11):IMPRESSION:  Technically successful fluoroscopic guided lumbar puncture via a left paramidline approach at L2-3.    Medications: I have reviewed the patient's current medications. Scheduled Meds:   . ipratropium  0.5 mg Nebulization Q6H   And  . albuterol  2.5 mg Nebulization Q6H  . ceFEPime (MAXIPIME) IV  1 g Intravenous Q24H  . Fluticasone-Salmeterol  1 puff Inhalation Q12H  . hydrALAZINE  10 mg Intravenous Q6H  . labetalol  10 mg Intravenous Q6H  . labetalol  10 mg Intravenous Once  . labetalol  10 mg Intravenous Once  . levothyroxine  44 mcg Intravenous QAC breakfast  . LORazepam  2 mg Intravenous Once  . pantoprazole (PROTONIX) IV  40 mg Intravenous QHS  . paricalcitol  6 mcg Intravenous Q M,W,F-HD  . sevelamer  1,600 mg Oral TID WC  . sodium chloride  3 mL Intravenous Q12H  . vancomycin  750 mg Intravenous Q M,W,F-HD  . DISCONTD: acyclovir  5 mg/kg Intravenous Q24H   Continuous Infusions:  PRN Meds:.sodium chloride, sodium chloride, alteplase, feeding supplement (NEPRO CARB STEADY), haloperidol lactate, heparin, lidocaine, lidocaine-prilocaine, pentafluoroprop-tetrafluoroeth, DISCONTD: haloperidol lactate Assessment/Plan: 1. Altered Mental Status: Altered mental status is largely unchanged from yesterday. Patient is arousable, and follows commands, but has little insight. She is easily agitated. OPatient has not received any Ativan in the past 24-hours. Patient has received Haloperidol 5mg  iv twice. Patient does not appear rigid or to have any dyskinesia. The LP does not have any signs of viral infection, Acyclovir will be discontinued.  -- Converting to PO medications since patient is  eating solid meals.  -- Discontinue Acyclovir. -- Attempting to contact Dr. Willis's(neurologist) office to update him and get recommendations. -- Psychiatry consulted because no improvement.   -- Will discontinue restraints because IJ central line has been removed.   2. Health Care Associated Pneumonia: She  was started on Vancomycin and Cefepime upon admission. The patient was only briefly febrile. She never had elevated WBCs. Her CXR has improved. Patient may have had pneumonia vs. mucous plugging given the rapid CXR improvement. Will convert patient to po coverage.  -- Discontinue Vancomycin and Cefepime -- Add Cefuroxime 500mg  po bid  3. Hypertension: Patient has a long standing history of hypertension with a baseline SBP 160-170. During most of the day yesterday her systolic blood pressure was in the 160s. She is receiving dialysis toady. Will convert her back to her home HTN medications. Given her extreme HTN after dialysis will resume her entire therapy. Will continue to monitor.  -- Discontinue iv hydralazine/labatelol -- Add Carvedilol 25mg  po bid -- Add Hydralazine 25mg  po bid -- Add Amlodipine 10mg  po daily  4. End Stage Renal Disease: Patient has ESRD with MWF hemodialysis. Getting hemodialysis today. Getting therapy to lower phosphorus levels.  -- Being followed by nephrology, we appreciate their input.  -- Maintain Paricalcitol iv qMWF during HD.  -- Maintain Renvela 800--2 TID with meals  5. Trichomonas on Urine Analysis: This is a suspect finding. The patient had a urineanalysis on 12/16 that did not reveal trichomonas. Possible that trichomonas infection has spread to urine in meantime.  -- Repeat UA  -- If still positive Metronidazole therapy.   6. Hypothyroidism: Patient has a history of hypothyroidism. Her TSH on admission was 1.536.  -- Discontinue iv Synthroid -- Add Synthroid po daily  7. R Renal Mass with History of left RCC: Spoke with radiology. They advise getting abdominal MRI, which patient has improved mental status, because abdominal MRI requires a high level of patient participation.  -- Consider abdominal MRI when patient's mental status improves.   8. Coronary Artery Disease: Patient has a history of CVA in 2003, CAD with a stent to her RCA in 2007,  and iliac stenting. Given the patient's prior history, family history, 106 pack year history of smoking, ESRD, and uncontrolled hypertension; she is at high risk for further vascular insults. Her most recent lipid panel (11/29/10) was total cholesterol 145, LDL 80, TG 128, HDL 40.   9. Chronic Diastolic Heart Failure: last catheterization on 02/02/09 shows an ejection fraction of 55%. She appears euvolemic today. She has minimal JVD, and no lower extremity edema.    LOS: 5 days   Wilmer Floor 02/22/2011, 10:50 AM  Addendum:   Patient was seen, examined and reviewed with Wylene Men MS4.   Subjective:  We examined the patient while she was getting dialysis. Patient was sleepy but easily arousable. She was not complaining of any pain. Daughter feels that we should contact Dr. Anne Hahn as he is the Neurologist they have seen in the past.    Objective:  BP 112/56  Pulse 99  Temp(Src) 97.4 F (36.3 C) (Oral)  Resp 15  Ht 5\' 2"  (1.575 m)  Wt 143 lb 15.4 oz (65.3 kg)  BMI 26.33 kg/m2  SpO2 100%   Physical Exam:  Gen: Patient is alert oriented x 2  Head: Normocephalic, Atraumatic.  Eyes: Pupils equally round and reactive to light bilaterally. Sclera non-injected and anicteric.  Mouth: Normal dentition. Moist mucus membranes.  Neck: Trachea midline.  No cervical lymphadenopathy. No thyroidmegaly.  Pulmonary: Patient does not participate in exam. Breath sounds difficult to hear bilaterally. L>R dullness to percussion.  Cardiac: Regular rate and rhythm. No murmurs, rubs, or gallops. Normal S1 and S2.  GI: Bowel sounds present. Non-tender to palpation. No hepatosplenomegaly. Well healed surgical scars.  Extremities: 1+ left radial pulse. 2+ right radial pulse. 2+ pedal pulses bilaterally. Left arm is cool to touch. No clubbing. Patient's grafts are non-erythematous and non-tender.  Neuro: Moving all extremities. Does not follow commands.  Psych: Following commands and psych is somewhat  appropriate but still far from baseline   Lab Results:  Patient's labs and imaging studies were reviewed.  UA s/o trichomonas   Micro Results:  Blood Cultures : No growth to date  Urine Cultures Pending  urine strep antigen is negative   Studies/Results:  Dg Chest Portable 1 View (02/17/2011) at 1034  IMPRESSION: Vague opacities at the lung bases medially. Pneumonia cannot be excluded. Stable cardiomegaly.  Dg Chest 1 View (02/17/2011) at 1533  IMPRESSION: Enlargement of cardiac silhouette with pulmonary vascular congestion. Bibasilar consolidation.  Dg Chest Port 1 View (02/18/1011)  IMPRESSION: Overall worsening of asymmetric infiltrates or edema, left greater than right.  Ct Head Wo Contrast (02/18/2011)  IMPRESSION: Limited examination due to excessive patient motion. No significant change from the prior examination. Old ischemic changes in the left cerebral cortex and cerebellar hemispheres.   Medications: I have reviewed the patient's current medications.  Scheduled Meds:  .  acyclovir  5 mg/kg  Intravenous  Q24H   .  albuterol-ipratropium  2 puff  Inhalation  Q6H   .  ceFEPime (MAXIPIME) IV  1 g  Intravenous  Q24H   .  Fluticasone-Salmeterol  1 puff  Inhalation  Q12H   .  hydrALAZINE  10 mg  Intravenous  Q6H   .  labetalol      .  labetalol  10 mg  Intravenous  Q6H   .  labetalol  10 mg  Intravenous  Once   .  labetalol  10 mg  Intravenous  Once   .  levothyroxine  44 mcg  Intravenous  QAC breakfast   .  LORazepam  1 mg  Intravenous  Once   .  LORazepam  2 mg  Intravenous  Once   .  pantoprazole (PROTONIX) IV  40 mg  Intravenous  QHS   .  paricalcitol  6 mcg  Intravenous  Q M,W,F-HD   .  sodium chloride  3 mL  Intravenous  Q12H   .  sodium chloride      .  sodium chloride      .  vancomycin  750 mg  Intravenous  Q M,W,F-HD   .  DISCONTD: levothyroxine  50 mcg  Intravenous  QAC breakfast    Assessment/Plan:  1. Health care associated Pneumonia: We will change her therapy  to oral as she is eating well.  2. Altered Mental Status: It seems like we have exhausted all our resources at this time and this is less likely to be infectious. Her electrolytes are fine. Stroke is less likely because patient is able to move all extremities and MRI is negative. Role of medication is unclear. LP results negative at this time. I will get psych input today to manage her psychosis.  3. Hypertension: Patient has a long standing history of hypertension with a baseline SBP 160-170. Patient blood pressure is currently being managed with IV medications. Will restart all  her po meds.  4. End Stage Renal Disease: We'll continue to follow renal recommendations.   5. CAD   6. Hypothyroidism- start PO dose.   Lars Mage MD  [facility] Internal Medicine Resident  Pager [phone]  1:59 PM

## 2011-02-22 NOTE — Evaluation (Signed)
Physical Therapy Evaluation Patient Details Name: [name] MRN: [id number] DOB: 06-21-1945 Today's Date: 02/22/2011  Problem List:  Patient Active Problem List  Diagnoses  . OTHER AND UNSPECIFIED HYPERLIPIDEMIA  . TOBACCO USER  . Essential hypertension, malignant  . CAD  . Chronic diastolic heart failure  . CVA  . PVD  . ESRD  . LEG PAIN  . Altered mental state    Past Medical History:  Past Medical History  Diagnosis Date  . CAD (coronary artery disease)     stent to RCA  . CVA (cerebral infarction) 2003    no apparent residual  . Renal failure     related to hypertension  . Hypothyroidism   . PVD (peripheral vascular disease)   . Hyperlipidemia   . Positive PPD     completed rifampin  . Diastolic congestive heart failure   . Renal insufficiency   . Hypertension   . COPD (chronic obstructive pulmonary disease) t  . Cancer     clear cell cancer, kidney  . Complication of anesthesia 12/2010    pt is very confused, with AMS with anesthesia   Past Surgical History:  Past Surgical History  Procedure Date  . Abdominal hysterectomy   . Cholecystectomy   . D&cs   . Right ankle repair   . Left nephrectomy   . Vascular surgery 11/2010    graft inserted to left arm    PT Assessment/Plan/Recommendation Clinical Impression Statement: Daughter present throughout session. Pt initially sleeping and snoring and unarousable.  Stepped out of room and daughter called me back stating she was awake and wanted to get up. Pt pulling against wrist restraints on entry, "take these off, I need to use the bathroom."  Pt easily distracted while lines/monitors/restraints arranged. RN entered room as pt coming to sit EOB. Monitor showing HR 177--pt alert, talking, brachial pulse 84.  HR then back down to 92 as RN obtained stethoscope to listen to apical pulse=91. Pt assisted to recliner.  Pt then with incr restlessness and wanting to use bathroom.  RN in to bring [facility].  Pt on/off BSC  without incident.  Once sitting in recliner began to complain of dizziness, then nausea, then feeling hot. Pt reclined to nearly supine position and became less alert.  RN called in to assess pt.  Arousing to sternal rub.  Attempted use of Maxi-move to lift pt back to bed and lift was not functioning. Pt lifted recliner to bed with underlying sheet with 4 people.  RN's attending to pt on PT departure.  Pt clearly not at baseline (functionally or cognitively) as she was able to live alone PTA.  If medically stable, will benefit from PT to further assess balance and mobility issues. PT Recommendation/Assessment: Patient will need skilled PT in the acute care venue PT Problem List: Decreased activity tolerance;Decreased balance;Decreased mobility;Decreased cognition;Decreased knowledge of use of DME;Decreased safety awareness;Cardiopulmonary status limiting activity Barriers to Discharge: Decreased caregiver support PT Therapy Diagnosis : Difficulty walking;Altered mental status PT Frequency: Min 3X/week PT Treatment/Interventions: DME instruction;Gait training;Functional mobility training;Therapeutic activities;Therapeutic exercise;Balance training;Cognitive remediation;Patient/family education Recommendations for Other Services: OT consult Follow Up Recommendations: Skilled nursing facility Equipment Recommended: Defer to next venue PT Goals  Acute Rehab PT Goals PT Goal Formulation: Patient unable to participate in goal setting Time For Goal Achievement: 2 weeks Pt will go Supine/Side to Sit: with supervision;Other (comment) (distant supervision with good safety awareness) PT Goal: Supine/Side to Sit - Progress: Goal set today Pt will go Sit  to Supine/Side: with modified independence PT Goal: Sit to Supine/Side - Progress: Goal set today Pt will go Sit to Stand: with supervision PT Goal: Sit to Stand - Progress: Goal set today Pt will go Stand to Sit: with supervision PT Goal: Stand to Sit -  Progress: Goal set today Pt will Ambulate: 51 - 150 feet;with supervision;with least restrictive assistive device PT Goal: Ambulate - Progress: Goal set today Pt will Perform Home Exercise Program: with supervision, verbal cues required/provided Additional Goals Additional Goal #1: Pt to attend to PT instructions in distracting hospital hallway with less than 2 cues for re-direction over 10 minute period. PT Goal: Additional Goal #1 - Progress: Goal set today  PT Evaluation Precautions/Restrictions  Precautions Precautions: Fall Precaution Comments: unsteady; AMS Prior Functioning  Home Living Lives With: Alone (daughter aware will need SNF (per SW note)) Prior Function Level of Independence: Independent with basic ADLs;Independent with homemaking with ambulation;Independent with gait Cognition Cognition Arousal/Alertness: Lethargic (initially difficult to arouse; then awake and wants BSC) Overall Cognitive Status: History of cognitive impairments History of Cognitive Impairment: Decline in baseline functioning Attention: Impaired Current Attention Level: Sustained Attention - Other Comments: at most 15 seconds; internally distracted "I just don't feel right" Memory: Appears impaired Memory Deficits: daughter reports declining memory for ~1 year Orientation Level: Oriented to person;Oriented to place;Disoriented to situation Safety/Judgement: Decreased safety judgement for tasks assessed Decreased Safety/Judgement: Impulsive;Decreased awareness of need for assistance Sensation/Coordination   Extremity Assessment RLE Assessment RLE Assessment:  (grossly WFL, difficult to assess due to AMS) LLE Assessment LLE Assessment:  (grossly WFL, difficult to assess due to AMS) Mobility (including Balance) Bed Mobility Bed Mobility: Yes Supine to Sit: 5: Supervision;HOB flat;With rails Supine to Sit Details (indicate cue type and reason): incr time to process request to sit EOB Sitting -  Scoot to Edge of Bed: 5: Supervision Sitting - Scoot to Edge of Bed Details (indicate cue type and reason): for safety Transfers Transfers: Yes Sit to Stand: 4: Min assist;With upper extremity assist;From bed;From chair/3-in-1;With armrests Sit to Stand Details (indicate cue type and reason): unsteady even with wide base of support Stand to Sit: 4: Min assist;With upper extremity assist;With armrests;To chair/3-in-1 Stand Pivot Transfers: 4: Min assist;With armrests Stand Pivot Transfer Details (indicate cue type and reason): pivoted x 3 between bed, chair, BSC Ambulation/Gait Ambulation/Gait: No    Exercise    End of Session PT - End of Session Equipment Utilized During Treatment: Gait belt Activity Tolerance: Treatment limited secondary to medical complications (Comment) (see assessment for comments) Patient left: in bed;with family/visitor present;Other (comment) (RN present) Nurse Communication: Mobility status for transfers;Other (comment) (decr arousal, nausea, dizzy, feeling hot) General Behavior During Session: Restless (became less alert at end of session)  Franchot Pollitt 02/22/2011, 5:03 PM Pager [phone]

## 2011-02-23 ENCOUNTER — Inpatient Hospital Stay (HOSPITAL_COMMUNITY): Payer: Medicare Other

## 2011-02-23 DIAGNOSIS — R4182 Altered mental status, unspecified: Secondary | ICD-10-CM

## 2011-02-23 DIAGNOSIS — R9431 Abnormal electrocardiogram [ECG] [EKG]: Secondary | ICD-10-CM | POA: Diagnosis not present

## 2011-02-23 DIAGNOSIS — I2510 Atherosclerotic heart disease of native coronary artery without angina pectoris: Secondary | ICD-10-CM

## 2011-02-23 LAB — CARDIAC PANEL(CRET KIN+CKTOT+MB+TROPI)
CK, MB: 5.6 ng/mL — ABNORMAL HIGH (ref 0.3–4.0)
Total CK: 52 U/L (ref 7–177)
Total CK: 35 U/L (ref 7–177)
Troponin I: <0.3 ng/mL (ref <0.30)

## 2011-02-23 MED ORDER — BIOTENE DRY MOUTH MT LIQD
15.0000 mL | Freq: Two times a day (BID) | OROMUCOSAL | Status: DC
  Administered 2011-02-23 – 2011-02-26 (×7): 15 mL via OROMUCOSAL

## 2011-02-23 MED ORDER — ALBUTEROL SULFATE HFA 108 (90 BASE) MCG/ACT IN AERS
2.0000 | INHALATION_SPRAY | Freq: Four times a day (QID) | RESPIRATORY_TRACT | Status: DC | PRN
  Filled 2011-02-23: qty 6.7

## 2011-02-23 MED FILL — Dextrose Inj 5%: INTRAVENOUS | Qty: 50 | Status: AC

## 2011-02-23 NOTE — Progress Notes (Signed)
PT Cancellation Note     Treatment cancelled this am due to RN and daughter both stated that pt had a bad night last night with little sleep.  Will try back in pm if possible.  02/23/2011  [place] Bing, PT [phone] (pager)

## 2011-02-23 NOTE — Progress Notes (Signed)
Pt too sleepy  For consult at this time. Will revisit later.

## 2011-02-23 NOTE — Consult Note (Signed)
Pt smokes 1 ppd and says she enjoys smoking. Verbalizes understanding of the risk factors. Also refuses education information. Gave pt contact info for future interest.

## 2011-02-23 NOTE — Progress Notes (Addendum)
Subjective: During physical therapy yesterday, patient's HR went into the 140s, and patient became lethargic. 12-lead done at time showed anterolateral ST elevation with biphasic T waves. Patient was taken to the cath lab, which revealed stress induced cardiomyopathy, but no thrombotic events. No intervention was performed. She was transferred to the cardiac ICU. Overnight, the patient had difficulty sleeping due to ICU activity, but her mental status improved. By the morning she was sitting up in bed, lucent, and interactive. She denies any chest pain or shortness of breath. She denies any pain at her catheterization site.   Objective: Vital signs in last 24 hours: Filed Vitals:   02/23/11 0826 02/23/11 0830 02/23/11 0900 02/23/11 1146  BP:   170/71   Pulse: 77  78   Temp: 97.6 F (36.4 C)   98.4 F (36.9 C)  TempSrc: Oral   Oral  Resp: 16  17   Height:      Weight:      SpO2: 99% 99% 97% 98%   Physical Exam: Head: Normocephalic, Atraumatic.  Eyes: Pupils equally round and reactive to light bilaterally. Sclera non-injected and anicteric.  Neck: Trachea midline. No cervical lymphadenopathy. No thyroidmegaly. No JVD.  Pulmonary: Clear to auscultation bilaterally. Breath sounds heard bilaterally. No wheezing, rales, or rhonchii.  Cardiac: Regular rate and rhythms. No murmurs, rubs, or gallops. Normal S1 and S2.  Extremities: Patient's grafts are non-erythematous and non-tender. Femoral catheter access site does not have hematoma or bruit. 1+ left radial pulse. 2+ right radial pulse. 2+ pedal pulses bilaterally. No cyanosis or clubbing.  Neuro: Moving all extremities. Can grip fingers equivalently bilaterally. Toes down and in. Foot extension is equivalent bilaterally.  Psych: Alert. Orientated to person, time, and place. Conversant, but still confused. Able to eat food with minimal assistance.   Lab Results: CK-MB: 6.3 --> 6.7 --> 5.6 --> 4.9 CK: 83 --> 95 --> 52 --> 35 Troponin (<0.30)  --> (0.33) --> (<0.30) --> (<0.30) Na 131, K 3.8, Cl 93, CO2 27, BUN 16, Cr 4.48, Glucose 128, Ca 11.0 WBCs 4.7, HGB 13.1, PLTs 80 Alk Phos 59, Albumin 2.7, AST 27, ALT 20, Tprotein 6.5, Tbili 0.4 Lipids: Total Chol 159, TG 140, HDL 44, LDL 87 Urine analysis: Repeat UA is negative for trichomonas  Studies/Results: Ct Head Wo Contrast (02/22/2011) IMPRESSION: No evidence of acute or reversible findings.  Old left hemispheric cortical and subcortical infarctions and old cerebellar infarctions. Intravascular contrast is present, presumably related to recent intraarterial catheter procedure.  Dg Chest Port 1 View (02/23/2011) FINDINGS: Removal of left IJ venous access.  Stable heart size with mild central vascular congestion.  Minimal basilar atelectasis.  No focal pneumonia, collapse, consolidation, effusion or pneumothorax. Trachea midline.  IMPRESSION: Stable chest exam.  Removal of left IJ central venous access.    Cardiac Catheterization (02/22/2011): Final Conclusions:  1. Patent RCA stent with no evidence of obstructive coronary artery disease.  2. Moderately reduced LV systolic function with an estimated ejection fraction 35-40% with an appearance suggestive of stress-induced cardiomyopathy.  3. Low left ventricular end-diastolic pressure.    Medications: I have reviewed the patient's current medications. Scheduled Meds:   . amLODipine  10 mg Oral Daily  . aspirin      . aspirin  324 mg Oral Once  . aspirin  81 mg Oral Daily  . bivalirudin      . carvedilol  25 mg Oral BID WC  . cefUROXime  500 mg Oral BID WC  . diphenhydrAMINE      .  famotidine      . feeding supplement (NEPRO CARB STEADY)  237 mL Oral TID BM  . Fluticasone-Salmeterol  1 puff Inhalation Q12H  . heparin      . hydrALAZINE  25 mg Oral BID  . levothyroxine  88 mcg Oral QAC breakfast  . lidocaine      . methylPREDNISolone sodium succinate      . midazolam      . nitroGLYCERIN      . pantoprazole  40 mg Oral Q1200   . paricalcitol  6 mcg Intravenous Q M,W,F-HD  . sevelamer  1,600 mg Oral TID WC  . simvastatin  20 mg Oral q1800  . sodium chloride  3 mL Intravenous Q12H  . DISCONTD: albuterol  2.5 mg Nebulization Q6H  . DISCONTD: ceFEPime (MAXIPIME) IV  1 g Intravenous Q24H  . DISCONTD: hydrALAZINE  10 mg Intravenous Q6H  . DISCONTD: ipratropium  0.5 mg Nebulization Q6H  . DISCONTD: labetalol  10 mg Intravenous Q6H  . DISCONTD: labetalol  10 mg Intravenous Once  . DISCONTD: labetalol  10 mg Intravenous Once  . DISCONTD: levothyroxine  44 mcg Intravenous QAC breakfast  . DISCONTD: LORazepam  2 mg Intravenous Once  . DISCONTD: pantoprazole (PROTONIX) IV  40 mg Intravenous QHS  . DISCONTD: vancomycin  750 mg Intravenous Q M,W,F-HD   Continuous Infusions:  PRN Meds:.albuterol, feeding supplement (NEPRO CARB STEADY), haloperidol lactate, heparin, lidocaine, lidocaine-prilocaine, pentafluoroprop-tetrafluoroeth, DISCONTD: sodium chloride, DISCONTD: sodium chloride Assessment/Plan: 1. Stress Induced Cardiomyopathy: After PT yesterday patient presented with anterior distribution ST-elevations. She was given 4 Aspirin 81 mg. Heparin was not given due to a history of heparin-induced thrombocytopenia. Angiomax was ordered, but patient was taken to the cath lab before it could be given. Cardiac catheterization did not reveal any intracoronary thrombus. Catheterization found EF of 35-40% with an appearance suggestive of stress-induced cardiomyopathy. Patient had a minimal elevation of troponin I, which has subsequently resolved. Patient does not appear to have suffered from significant myocardial damage. Patient is currently without chest pain or shortness of breath. Patient did not experience any episodes of tachycardia or irregular rhythms last night. She is currently in the cardiac icu in stable condition. CT of the head ruled out intracranial hemorrhage as a cause of stress.  -- Limited physical activity today.  --  Up to chair as tolerated.  -- Being followed by cardiology, we appreciate their input.  -- Repeat Echocardiogram in 2-4 weeks  2. Altered Mental Status: Altered mental status is improved from yesterday Patient is alert and oriented to person, time, and place. She follows commands. However, she continues to make odd statements like "The tv is going to bring my breakfast this morning." Medical work-up remains negative. Continuing to minimize psychoactive medications.  -- Minimize sedating medications -- Nepro TID between meals, per nutrition recommendations. We appreciate their input.   3. Health Care Associated Pneumonia: Patient is currently being covered with Cefuroxime. She has been afebrile. She denies SOB. CXR does not show any evidence of consolidation.   -- Continue Cefuroxime 500mg  po bid (currently day 6 of therapy)  4. Hypertension: Patient has a long standing history of hypertension with a baseline SBP 160-170. During most of the day yesterday her systolic blood pressure was in the 160s. Patient's blood pressure has been highly variable, with a 24-hour range of 101/54 - 176/93. Patient was recently converted back to her home regimen. Will continue to monitor and optimize.  -- Add Carvedilol 25mg  po bid  --  Add Hydralazine 25mg  po bid  -- Add Amlodipine 10mg  po daily   5. End Stage Renal Disease: Patient has ESRD with MWF hemodialysis. Getting hemodialysis today. Getting therapy to lower phosphorus levels.  -- Being followed by nephrology, we appreciate their input.  -- Maintain Paricalcitol iv qMWF during HD.  -- Maintain Renvela 800--2 TID with meals   6. R Renal Mass with History of left RCC: Spoke with radiology. They advise getting abdominal MRI, which patient has improved mental status, because abdominal MRI requires a high level of patient participation.  -- Patient receiving abdominal MRI today  7. Coronary Artery Disease: Patient has a history of CVA in 2003, CAD with a  stent to her RCA in 2007, and iliac stenting. Given the patient's prior history, family history, 106 pack year history of smoking, ESRD, and uncontrolled hypertension; she is at high risk for further vascular insults. Her most recent lipid panel (02/22/11) wasTotal Chol 159, TG 140, HDL 44, LDL 87. -- Maintain Simvastatin 20mg  po daily -- Maintain ASA 81mg  daily  8. COPD: Patient has a history of COPD. She has not had any shortness of breath.  -- Maintain Advair 1 puff daily -- Maintain duoneb q6h  9. Trichomonas on Urine Analysis: This is a suspect finding. The patient had a urineanalysis on 12/16 that did not reveal trichomonas. Repeat urine analysis was negative for trichomonas. Likely a lab abnormality. Will not treat.   10. Hypothyroidism: Patient has a history of hypothyroidism. Her TSH on admission was 1.536.  -- Maintain Synthroid po daily   LOS: 6 days   Wilmer Floor 02/23/2011, 2:30 PM  Addendum:   Patient was seen, examined and reviewed with Wylene Men MS4.   Subjective:  She is calm and not altered any more. No complaints other than food could be better. Patient had cardiac acth for STEMI but the cath was consistent with Takotsubo cardiomyopathy.  Objective:   BP 152/61  Pulse 83  Temp(Src) 98.4 F (36.9 C) (Oral)  Resp 17  Ht 5\' 2"  (1.575 m)  Wt 136 lb 3.9 oz (61.8 kg)  BMI 24.92 kg/m2  SpO2 97%   Physical Exam:  Gen: Patient is alert oriented x 2  Head: Normocephalic, Atraumatic.  Eyes: Pupils equally round and reactive to light bilaterally. Sclera non-injected and anicteric.  Mouth: Normal dentition. Moist mucus membranes.  Neck: Trachea midline. No cervical lymphadenopathy. No thyroidmegaly.  Pulmonary: Patient does not participate in exam. Breath sounds difficult to hear bilaterally. L>R dullness to percussion.  Cardiac: Regular rate and rhythm. No murmurs, rubs, or gallops. Normal S1 and S2.  GI: Bowel sounds present. Non-tender to palpation.  No hepatosplenomegaly. Well healed surgical scars.  Extremities: 1+ left radial pulse. 2+ right radial pulse. 2+ pedal pulses bilaterally. Left arm is cool to touch. No clubbing. Patient's grafts are non-erythematous and non-tender.  Neuro: Moving all extremities. Does not follow commands.  Psych: Following commands and psych is somewhat appropriate but still far from baseline   Lab Results:   Patient's labs and imaging studies were reviewed.    Micro Results:  Blood Cultures : No growth to date  Urine Cultures: negative urine strep antigen is negative   Studies/Results: reviewed       . amLODipine  10 mg Oral Daily  . aspirin      . aspirin  324 mg Oral Once  . aspirin  81 mg Oral Daily  . bivalirudin      . carvedilol  25 mg Oral BID WC  . cefUROXime  500 mg Oral BID WC  . diphenhydrAMINE      . famotidine      . feeding supplement (NEPRO CARB STEADY)  237 mL Oral TID BM  . Fluticasone-Salmeterol  1 puff Inhalation Q12H  . heparin      . hydrALAZINE  25 mg Oral BID  . levothyroxine  88 mcg Oral QAC breakfast  . lidocaine      . methylPREDNISolone sodium succinate      . midazolam      . nitroGLYCERIN      . pantoprazole  40 mg Oral Q1200  . paricalcitol  6 mcg Intravenous Q M,W,F-HD  . sevelamer  1,600 mg Oral TID WC  . simvastatin  20 mg Oral q1800  . sodium chloride  3 mL Intravenous Q12H  . DISCONTD: albuterol  2.5 mg Nebulization Q6H  . DISCONTD: ipratropium  0.5 mg Nebulization Q6H    Assessment/Plan:   1. Health care associated Pneumonia: On oral antibiotics per Dr. Feliz Beam recommendations.  2. Altered Mental Status: It seems like we have exhausted all our resources at this time and this is less likely to be infectious. Her electrolytes are fine. Stroke is less likely because patient is able to move all extremities and MRI is negative. Role of medication is unclear. LP results negative at this time.    3. Hypertension: Patient has a long standing history  of hypertension. Currently at baseline. Patient's blood pressure is currently being managed with IV medications. Will restart all her po meds.   4. End Stage Renal Disease: We'll continue to follow renal recommendations.   5. CAD: patient had STEMI as per the EKG changes after PT therapy session. She is on coreg and aspirin now as the cath suggested cardiomyopathy.  6. Hypothyroidism   Lars Mage MD R3 Internal Medicine Resident Pager [phone] 4:37 PM Internal Medicine Teaching Service Attending Note Date: 02/23/2011 Patient name: [name]  Medical record number: [id number]  Date of birth: Jun 16, 1945    This patient has been seen and discussed with the house staff on 02/23/2011. Please see their note for complete details. I concur with their findings with the following additions/corrections: On Wednesday evening 2/20, the patient went to cath for what on 12 lead appeared to be STEMI, on heart catherization, she had non-obstructive coronaries but evidence of decreased EF, thought to be stress-induced cardiomyopathy. Patient feels better this morning and mentation overall is improved.  Glori Machnik

## 2011-02-23 NOTE — Progress Notes (Signed)
Clinical Social Work Psychiatry  Assessment    Presenting Symptoms/Problems: Consult for AMS and recent episode of agitation and confusion.  Consult was first seen on  2/21 but assessment unable to be completed, due to patient medical condition and moving her to 2907.  Daughter in room at time of assessment and was able to provide more information.  Psychiatric History: Reported patient had a nervous break down over 20 years ago per daughter and reports stemming from some sort of stressor but it was not clear as to what.  Reports patient takes Ativan at home and that is the only known medication.  No recent stressors or problems per daughter.  No SI, HI, or psychosis.  Reports patient has been doing well and no previous outpatient follow up.   Family Collateral Information: Daughter reports patient lives at home alone and will need ST SNF rehab at time of discharge and all family members and patient are agreeable.  Patient has had several mini strokes with last one being over three years ago and patient has been on dialysis for last six years.  Daughter feel memory has declined in the last two years due to medical problems.  Strong family support and does not feel agitation/AMS is congruent with mental health problems, being that there have not been any recently.                      Emotional Health/Current Symptoms:      Attention/Behavioral/Psychotic Symptoms: Patient lying in the bed with eyes closed and upset because she has not had any sleep.  Patient daughter at bedside able to provide more information regarding patient.  Reports patient is doing much better than when she came in, alert and oriented to self, place and time.  (This writer was unable to assess patient accurately because she was resting in the bed, eyes closed but would have spontaneous movements in the bed with legs and arms.      Interpretive Summary/Anticipated DC Plan:   1.  Patient plan at dc is for ST rehab in which per  chart review and discussion with CSW the process has begun and when medically stable will go to rehab. 2.  Will discuss findings with Psych MD and await further recommendations. 3.  No agitation noted during assessment, but patient has been upset due to lack of sleep. 4.  No outpatient was discussed at this time.  Daughter feels unnecessary at this time.  Will continue to follow.  Ashley Jacobs, MSW LCSW [phone]

## 2011-02-23 NOTE — Progress Notes (Signed)
Physical Therapy Treatment Patient Details Name: [name] MRN: [id number] DOB: 16-Sep-1945 Today's Date: 02/23/2011  PT Assessment/Plan  PT - Assessment/Plan Comments on Treatment Session: pt much improved this treatment session.  Per family, pt is more spirited and getting back to herself.  Her movements are still uncoordinated and often synergistic in nature.  Her gait is also uncoordinated if not ataxic and with decr balance. PT Plan: Discharge plan remains appropriate Follow Up Recommendations: Skilled nursing facility Equipment Recommended: Defer to next venue PT Goals  Acute Rehab PT Goals PT Goal: Supine/Side to Sit - Progress: Progressing toward goal PT Goal: Sit to Supine/Side - Progress: Progressing toward goal PT Goal: Sit to Stand - Progress: Progressing toward goal PT Goal: Stand to Sit - Progress: Progressing toward goal PT Goal: Ambulate - Progress: Progressing toward goal Additional Goals Additional Goal #1: Pt to attend to PT instructions in distracting hospital hallway with less than 2 cues for re-direction over 10 minute period. PT Goal: Additional Goal #1 - Progress: Progressing toward goal  PT Treatment Precautions/Restrictions  Precautions Precautions: Fall Precaution Comments: unsteady; AMS Mobility (including Balance) Bed Mobility Supine to Sit: 5: Supervision;HOB flat;With rails Supine to Sit Details (indicate cue type and reason):  movements are uncoordinated and pt takes time to process. Sitting - Scoot to Edge of Bed: 5: Supervision Transfers Transfers: Yes Sit to Stand: Other (comment);From bed;With upper extremity assist (min guard assist) Sit to Stand Details (indicate cue type and reason): vc's for hand placement/safety; pt thrusts mildly against the bed for support Stand to Sit: 5: Supervision;To bed;To chair/3-in-1 Ambulation/Gait Ambulation/Gait: Yes Ambulation/Gait Assistance: 4: Min assist;Patient percentage (comment);Other (comment) (pt  = 75-85%) Ambulation/Gait Assistance Details (indicate cue type and reason): moderately large lateral w/shift, unequal step length short stride length.; as expected pt has better stability with +2 HHA than +1 HHA or no assist.  With no assistive device pt scissors moderately Ambulation Distance (Feet):  (300) Assistive device: 2 person hand held assist;1 person hand held assist (10-15 feet without assist) Gait Pattern: Step-through pattern;Decreased step length - right;Decreased step length - left;Decreased stride length;Scissoring (uncontrolled heel contact bil) Stairs: No  Posture/Postural Control Posture/Postural Control: No significant limitations Balance Balance Assessed: Yes Static Sitting Balance Static Sitting - Balance Support: Right upper extremity supported;Left upper extremity supported;Feet supported Static Sitting - Level of Assistance: 5: Stand by assistance Static Sitting - Comment/# of Minutes: 8 Static Standing Balance Static Standing - Balance Support: No upper extremity supported Static Standing - Level of Assistance: 4: Min assist;5: Stand by assistance (variable with/without eyes closed) Static Standing - Comment/# of Minutes: 3 Exercise    End of Session PT - End of Session Activity Tolerance: Patient tolerated treatment well Patient left: in chair;with call bell in reach;with family/visitor present Nurse Communication: Mobility status for transfers;Mobility status for ambulation General Behavior During Session: [facility] for tasks performed Cognition: [facility] for tasks performed  Sabino Denning, Eliseo Gum 02/23/2011, 4:25 PM  02/23/2011  [place] Bing, PT [phone] (pager)

## 2011-02-23 NOTE — Progress Notes (Signed)
@   Subjective:  Denies CP or dyspnea   Objective:  Filed Vitals:   02/23/11 0700 02/23/11 0813 02/23/11 0826 02/23/11 0830  BP: 166/68 159/72    Pulse: 79  77   Temp:   97.6 F (36.4 C)   TempSrc:   Oral   Resp: 17  16   Height:      Weight:      SpO2: 98%  99% 99%    Intake/Output from previous day:  Intake/Output Summary (Last 24 hours) at 02/23/11 0836 Last data filed at 02/23/11 0300  Gross per 24 hour  Intake    480 ml  Output   2816 ml  Net  -2336 ml    Physical Exam: Physical exam: Well-developed well-nourished in no acute distress.  Skin is warm and dry.  HEENT is normal.  Neck is supple. No thyromegaly.  Chest is clear to auscultation with normal expansion.  Cardiovascular exam is regular rate and rhythm.  Abdominal exam nontender or distended. No masses palpated. Right groin with no hematoma and no bruit Extremities show no edema. neuro grossly intact    Lab Results: Basic Metabolic Panel:  Basename 02/22/11 1815 02/22/11 0809 02/21/11 1236  NA 131* 135 --  K 3.8 3.9 --  CL 93* 96 --  CO2 27 23 --  GLUCOSE 128* 101* --  BUN 16 42* --  CREATININE 4.48* 8.76* --  CALCIUM 11.0* 11.1* --  MG -- -- --  PHOS -- 6.7* 7.0*   CBC:  Basename 02/22/11 1815 02/22/11 0809  WBC 4.7 5.1  NEUTROABS -- --  HGB 13.1 12.8  HCT 42.0 40.7  MCV 80.6 80.3  PLT 80* 97*   Cardiac Enzymes:  Basename 02/23/11 0244 02/22/11 1923 02/22/11 1815  CKTOTAL 52 95 83  CKMB 5.6* 6.7* 6.3*  CKMBINDEX -- -- --  TROPONINI <0.30 0.33* <0.30     Assessment/Plan:  1) ECG changes - cath revealed no obstructive CAD. Continue medical therapy including ASA, coreg, hydralazine and statin. Repeat echo in 2-4 weeks to see if LV function improved. Note head CT with no bleed. 2) ESRD management per nephrology. 3) AMS - improving; management per primary care Other issues per primary care; fu with Dr Antoine Poche 2-4 weeks after DC. Please call with questions. Olga Millers 02/23/2011, 8:36 AM

## 2011-02-23 NOTE — Progress Notes (Signed)
Subjective: Asleep, daughter Angelique Blonder at bedside Cardiac cath results noted (no CAD; mod decreased LV fct--EF 35-40 %))  Objective: Vital signs in last 24 hours: Blood pressure 170/71, pulse 78, temperature 97.6 F (36.4 C), temperature source Oral, resp. rate 17, height 5\' 2"  (1.575 m), weight 61.8 kg (136 lb 3.9 oz), SpO2 97.00%.   Intake/Output from previous day: 02/20 0701 - 02/21 0700 In: 720 [P.O.:720] Out: 2816  Intake/Output this shift: Total I/O In: 240 [P.O.:240] Out: -  Weight change:    PHYSICAL EXAM General--asleep Chest--clear, cath in L IJ Heart--no rub Abd--nontender Extr--AVG patent L upper arm  Lab Results:   Lab 02/22/11 1815 02/22/11 0809 02/21/11 1236 02/20/11 0821  NA 131* 135 140 --  K 3.8 3.9 4.2 --  CL 93* 96 99 --  CO2 27 23 25  --  BUN 16 42* 30* --  CREATININE 4.48* 8.76* 7.26* --  EGFR -- -- -- --  GLUCOSE 128* -- -- --  CALCIUM 11.0* 11.1* 11.3* --  PHOS -- 6.7* 7.0* 8.5*      Basename 02/22/11 1815 02/22/11 0809  WBC 4.7 5.1  HGB 13.1 12.8  HCT 42.0 40.7  PLT 80* 97*      Assessment/Plan: 1. AMS- old strokes by CT, prob TME. May have some early underlying dementia per family. Sen by  psychiatry 2. ESRD - HD 20 Feb, CXR shows improved PNA. attempt 1 kg more as tolerated. Weight 61.8  kg post HD yesterday (EDW 71 kg as outpatient)--try for weight 61 kg tomorrow  3. Hypertension- amlodipine 10/d, carvedilol 25 BID, hydralazine 25 BID,  4. Fever/pulm infiltrates- now on po ceftin.  Check CXR today  5. Metabolic bone disease - zemplar and binders , Ca and phos both HIGH. Using low Ca bath in HD plus non-Ca phos binder  6. Hx of renal cell Ca, s/p L nephrectomy--will need repeat MRI while in hospital--I will order MRI.  High Ca could be from renal cell ca  7. HO CVA 2003  8. Hx CAD/MI stent 2007--cardiac cath yesterday as noted above  9. Hx of HIT 2007     LOS: 6 days   Vrinda Heckstall F 02/23/2011,10:59 AM   .[email or web address]

## 2011-02-23 NOTE — Consult Note (Signed)
Reason for Consult:Altered Mental Status  Referring Physician: Dr. Leata Bradley [name] is an 66 y.o. female.  HPI: Pt in 3309 was starting PT session, became weak, faint and nearly lost consciousness. Abnormal EKG prompted cardiac screening labs and cardiac cath.  Pt is transferred to Rm [id number] AXIS I Mental Status Change AXIS II Deferred AXIS III Past Medical History  Diagnosis Date  . CAD (coronary artery disease)     stent to RCA  . CVA (cerebral infarction) 2003    no apparent residual  . Renal failure     related to hypertension  . Hypothyroidism   . PVD (peripheral vascular disease)   . Hyperlipidemia   . Positive PPD     completed rifampin  . Diastolic congestive heart failure   . Renal insufficiency   . Hypertension   . COPD (chronic obstructive pulmonary disease) t  . Cancer     clear cell cancer, kidney  . Complication of anesthesia 12/2010    pt is very confused, with AMS with anesthesia    Past Surgical History  Procedure Date  . Abdominal hysterectomy   . Cholecystectomy   . D&cs   . Right ankle repair   . Left nephrectomy   . Vascular surgery 11/2010    graft inserted to left arm  AXIS IV  Complicated medical conditions, acute MI, family support AXIS V  GAF 40  Family History  Problem Relation Age of Onset  . Heart disease Father   . Hypertension Mother   . Dementia Mother   . Coronary artery disease Sister     Social History:  reports that she has been smoking Cigarettes.  She has a 106 pack-year smoking history. She does not have any smokeless tobacco history on file. She reports that she does not drink alcohol or use illicit drugs.  Allergies:  Allergies  Allergen Reactions  . Iohexol Swelling and Other (See Comments)    1970s; passed out and had facial/tongue swelling.  Requires 13-hour prep with prednisone and benadryl  . Ampicillin Hives and Rash  . Meperidine Hcl Swelling and Rash    Makes tongue swell  . Morphine Rash  . Penicillins  Rash  . Heparin     MDs told her not to take after reaction in ICU  . Pentazocine Lactate     Patient does not remember reaction to this med (Talwin).   . Phenytoin Other (See Comments)    Had reaction while in ICU; doesn't know.  MDs told her not to take ever again.  . Ace Inhibitors Rash    Medications: I have reviewed patient's current medications  Results for orders placed during the hospital encounter of 02/17/11 (from the past 48 hour(s))  URINALYSIS, ROUTINE W REFLEX MICROSCOPIC     Status: Abnormal   Collection Time   02/21/11  6:15 AM      Component Value Range Comment   Color, Urine YELLOW  YELLOW     APPearance CLOUDY (*) CLEAR     Specific Gravity, Urine 1.015  1.005 - 1.030     pH 8.5 (*) 5.0 - 8.0     Glucose, UA 100 (*) NEGATIVE (mg/dL)    Hgb urine dipstick SMALL (*) NEGATIVE     Bilirubin Urine NEGATIVE  NEGATIVE     Ketones, ur NEGATIVE  NEGATIVE (mg/dL)    Protein, ur 237 (*) NEGATIVE (mg/dL)    Urobilinogen, UA 0.2  0.0 - 1.0 (mg/dL)    Nitrite NEGATIVE  NEGATIVE     Leukocytes, UA LARGE (*) NEGATIVE    URINE CULTURE     Status: Normal   Collection Time   02/21/11  6:15 AM      Component Value Range Comment   Specimen Description URINE, CLEAN CATCH      Special Requests NONE      Culture  Setup Time [id number]      Colony Count >=100,000 COLONIES/ML      Culture        Value: Multiple bacterial morphotypes present, none predominant. Suggest appropriate recollection if clinically indicated.   Report Status 02/22/2011 FINAL     URINE RAPID DRUG SCREEN (HOSP PERFORMED)     Status: Abnormal   Collection Time   02/21/11  6:15 AM      Component Value Range Comment   Opiates POSITIVE (*) NONE DETECTED     Cocaine NONE DETECTED  NONE DETECTED     Benzodiazepines NONE DETECTED  NONE DETECTED     Amphetamines NONE DETECTED  NONE DETECTED     Tetrahydrocannabinol NONE DETECTED  NONE DETECTED     Barbiturates NONE DETECTED  NONE DETECTED    LEGIONELLA  ANTIGEN, URINE     Status: Normal   Collection Time   02/21/11  6:15 AM      Component Value Range Comment   Specimen Description URINE, CLEAN CATCH      Special Requests NONE      Legionella Antigen, Urine Negative for Legionella pneumophilia serogroup 1      Report Status 02/21/2011 FINAL     STREP PNEUMONIAE URINARY ANTIGEN     Status: Normal   Collection Time   02/21/11  6:15 AM      Component Value Range Comment   Strep Pneumo Urinary Antigen NEGATIVE  NEGATIVE    URINE MICROSCOPIC-ADD ON     Status: Abnormal   Collection Time   02/21/11  6:15 AM      Component Value Range Comment   Squamous Epithelial / LPF MANY (*) RARE     WBC, UA TOO NUMEROUS TO COUNT  <3 (WBC/hpf)    RBC / HPF 7-10  <3 (RBC/hpf)    Bacteria, UA MANY (*) RARE     Urine-Other TRICHOMONAS PRESENT     PROTEIN AND GLUCOSE, CSF     Status: Abnormal   Collection Time   02/21/11  8:44 AM      Component Value Range Comment   Glucose, CSF 50  43 - 76 (mg/dL)    Total  Protein, CSF 62 (*) 15 - 45 (mg/dL)   CSF CULTURE     Status: Normal (Preliminary result)   Collection Time   02/21/11  8:44 AM      Component Value Range Comment   Specimen Description CSF      Special Requests 2.7ML      Gram Stain        Value: CYTOSPIN SLIDE WBC PRESENT,BOTH PMN AND MONONUCLEAR     NO ORGANISMS SEEN     Performed at [facility]   Culture NO GROWTH 1 DAY      Report Status PENDING     GRAM STAIN     Status: Normal   Collection Time   02/21/11  8:44 AM      Component Value Range Comment   Specimen Description CSF      Special Requests 2.7ML      Gram Stain        Value:  CYTOSPIN     WBC PRESENT, PREDOMINANTLY PMN NO ORGANISMS SEEN   Report Status 02/21/2011 FINAL     HERPES SIMPLEX VIRUS(HSV) DNA BY PCR     Status: Normal   Collection Time   02/21/11  8:44 AM      Component Value Range Comment   Specimen source hsv CSF      HSV 1 DNA Not Detected  Not Detected     HSV 2 DNA Not Detected  Not Detected    CSF  CELL COUNT WITH DIFFERENTIAL     Status: Abnormal   Collection Time   02/21/11  8:44 AM      Component Value Range Comment   Tube # 3      Color, CSF COLORLESS  COLORLESS     Appearance, CSF CLEAR  CLEAR     Supernatant NOT INDICATED      RBC Count, CSF 1 (*) 0 (/cu mm)    WBC, CSF 2  0 - 5 (/cu mm)    Segmented Neutrophils-CSF TOO FEW TO COUNT, SMEAR AVAILABLE FOR REVIEW  0 - 6 (%)    Lymphs, CSF RARE  40 - 80 (%)   FUNGUS CULTURE W SMEAR     Status: Normal (Preliminary result)   Collection Time   02/21/11  8:44 AM      Component Value Range Comment   Specimen Description CSF      Special Requests 2.7ML      Fungal Smear NO YEAST OR FUNGAL ELEMENTS SEEN      Culture CULTURE IN PROGRESS FOR FOUR WEEKS      Report Status PENDING     RENAL FUNCTION PANEL     Status: Abnormal   Collection Time   02/21/11 12:36 PM      Component Value Range Comment   Sodium 140  135 - 145 (mEq/L)    Potassium 4.2  3.5 - 5.1 (mEq/L) DELTA CHECK NOTED   Chloride 99  96 - 112 (mEq/L)    CO2 25  19 - 32 (mEq/L)    Glucose, Bld 113 (*) 70 - 99 (mg/dL)    BUN 30 (*) 6 - 23 (mg/dL) DELTA CHECK NOTED   Creatinine, Ser 7.26 (*) 0.50 - 1.10 (mg/dL)    Calcium 16.1 (*) 8.4 - 10.5 (mg/dL)    Phosphorus 7.0 (*) 2.3 - 4.6 (mg/dL)    Albumin 2.9 (*) 3.5 - 5.2 (g/dL)    GFR calc non Af Amer 5 (*) >90 (mL/min)    GFR calc Af Amer 6 (*) >90 (mL/min)   CBC     Status: Abnormal   Collection Time   02/22/11  8:09 AM      Component Value Range Comment   WBC 5.1  4.0 - 10.5 (K/uL)    RBC 5.07  3.87 - 5.11 (MIL/uL)    Hemoglobin 12.8  12.0 - 15.0 (g/dL)    HCT 09.6  04.5 - 40.9 (%)    MCV 80.3  78.0 - 100.0 (fL)    MCH 25.2 (*) 26.0 - 34.0 (pg)    MCHC 31.4  30.0 - 36.0 (g/dL)    RDW 81.1 (*) 91.4 - 15.5 (%)    Platelets 97 (*) 150 - 400 (K/uL)   COMPREHENSIVE METABOLIC PANEL     Status: Abnormal   Collection Time   02/22/11  8:09 AM      Component Value Range Comment   Sodium 135  135 - 145 (mEq/L)  Potassium 3.9  3.5 - 5.1 (mEq/L)    Chloride 96  96 - 112 (mEq/L)    CO2 23  19 - 32 (mEq/L)    Glucose, Bld 101 (*) 70 - 99 (mg/dL)    BUN 42 (*) 6 - 23 (mg/dL)    Creatinine, Ser 1.61 (*) 0.50 - 1.10 (mg/dL)    Calcium 09.6 (*) 8.4 - 10.5 (mg/dL)    Total Protein 6.6  6.0 - 8.3 (g/dL)    Albumin 2.7 (*) 3.5 - 5.2 (g/dL)    AST 25  0 - 37 (U/L)    ALT 17  0 - 35 (U/L)    Alkaline Phosphatase 61  39 - 117 (U/L)    Total Bilirubin 0.4  0.3 - 1.2 (mg/dL)    GFR calc non Af Amer 4 (*) >90 (mL/min)    GFR calc Af Amer 5 (*) >90 (mL/min)   PHOSPHORUS     Status: Abnormal   Collection Time   02/22/11  8:09 AM      Component Value Range Comment   Phosphorus 6.7 (*) 2.3 - 4.6 (mg/dL)   URINALYSIS, ROUTINE W REFLEX MICROSCOPIC     Status: Abnormal   Collection Time   02/22/11  4:31 PM      Component Value Range Comment   Color, Urine YELLOW  YELLOW     APPearance CLEAR  CLEAR     Specific Gravity, Urine 1.015  1.005 - 1.030     pH 8.5 (*) 5.0 - 8.0     Glucose, UA 100 (*) NEGATIVE (mg/dL)    Hgb urine dipstick SMALL (*) NEGATIVE     Bilirubin Urine NEGATIVE  NEGATIVE     Ketones, ur NEGATIVE  NEGATIVE (mg/dL)    Protein, ur 045 (*) NEGATIVE (mg/dL)    Urobilinogen, UA 0.2  0.0 - 1.0 (mg/dL)    Nitrite NEGATIVE  NEGATIVE     Leukocytes, UA MODERATE (*) NEGATIVE    URINE MICROSCOPIC-ADD ON     Status: Abnormal   Collection Time   02/22/11  4:31 PM      Component Value Range Comment   Squamous Epithelial / LPF RARE  RARE     WBC, UA 7-10  <3 (WBC/hpf)    RBC / HPF 3-6  <3 (RBC/hpf)    Bacteria, UA FEW (*) RARE    CBC     Status: Abnormal   Collection Time   02/22/11  6:15 PM      Component Value Range Comment   WBC 4.7  4.0 - 10.5 (K/uL)    RBC 5.21 (*) 3.87 - 5.11 (MIL/uL)    Hemoglobin 13.1  12.0 - 15.0 (g/dL)    HCT 40.9  81.1 - 91.4 (%)    MCV 80.6  78.0 - 100.0 (fL)    MCH 25.1 (*) 26.0 - 34.0 (pg)    MCHC 31.2  30.0 - 36.0 (g/dL)    RDW 78.2 (*) 95.6 - 15.5 (%)     Platelets 80 (*) 150 - 400 (K/uL) CONSISTENT WITH PREVIOUS RESULT  COMPREHENSIVE METABOLIC PANEL     Status: Abnormal   Collection Time   02/22/11  6:15 PM      Component Value Range Comment   Sodium 131 (*) 135 - 145 (mEq/L)    Potassium 3.8  3.5 - 5.1 (mEq/L)    Chloride 93 (*) 96 - 112 (mEq/L)    CO2 27  19 - 32 (mEq/L)    Glucose, Bld 128 (*)  70 - 99 (mg/dL)    BUN 16  6 - 23 (mg/dL) DELTA CHECK NOTED   Creatinine, Ser 4.48 (*) 0.50 - 1.10 (mg/dL) DELTA CHECK NOTED   Calcium 11.0 (*) 8.4 - 10.5 (mg/dL)    Total Protein 6.5  6.0 - 8.3 (g/dL)    Albumin 2.7 (*) 3.5 - 5.2 (g/dL)    AST 27  0 - 37 (U/L)    ALT 20  0 - 35 (U/L)    Alkaline Phosphatase 59  39 - 117 (U/L)    Total Bilirubin 0.4  0.3 - 1.2 (mg/dL)    GFR calc non Af Amer 9 (*) >90 (mL/min)    GFR calc Af Amer 11 (*) >90 (mL/min)   LIPID PANEL     Status: Normal   Collection Time   02/22/11  6:15 PM      Component Value Range Comment   Cholesterol 159  0 - 200 (mg/dL)    Triglycerides 161  <150 (mg/dL)    HDL 44  >09 (mg/dL)    Total CHOL/HDL Ratio 3.6      VLDL 28  0 - 40 (mg/dL)    LDL Cholesterol 87  0 - 99 (mg/dL)   PROTIME-INR     Status: Abnormal   Collection Time   02/22/11  6:15 PM      Component Value Range Comment   Prothrombin Time 16.0 (*) 11.6 - 15.2 (seconds)    INR 1.25  0.00 - 1.49    APTT     Status: Abnormal   Collection Time   02/22/11  6:15 PM      Component Value Range Comment   aPTT 47 (*) 24 - 37 (seconds)   CARDIAC PANEL(CRET KIN+CKTOT+MB+TROPI)     Status: Abnormal   Collection Time   02/22/11  6:15 PM      Component Value Range Comment   Total CK 83  7 - 177 (U/L)    CK, MB 6.3 (*) 0.3 - 4.0 (ng/mL)    Troponin I <0.30  <0.30 (ng/mL)    Relative Index RELATIVE INDEX IS INVALID  0.0 - 2.5    CARDIAC PANEL(CRET KIN+CKTOT+MB+TROPI)     Status: Abnormal   Collection Time   02/22/11  7:23 PM      Component Value Range Comment   Total CK 95  7 - 177 (U/L)    CK, MB 6.7 (*) 0.3 - 4.0  (ng/mL)    Troponin I 0.33 (*) <0.30 (ng/mL)    Relative Index RELATIVE INDEX IS INVALID  0.0 - 2.5    MRSA PCR SCREENING     Status: Normal   Collection Time   02/22/11  7:28 PM      Component Value Range Comment   MRSA by PCR NEGATIVE  NEGATIVE      Ct Head Wo Contrast  02/22/2011  *RADIOLOGY REPORT*  Clinical Data: Weakness.  Altered mental status.  CT HEAD WITHOUT CONTRAST  Technique:  Contiguous axial images were obtained from the base of the skull through the vertex without contrast.  Comparison: MRI 02/20/2011.  Head CT 02/17/2011.  Findings: There is intravascular contrast present.  Presumably, the patient has had a recent arterial catheterization procedure.  The brain shows generalized atrophy.  There are old cortical and subcortical infarctions in the left parietal and posterior frontal region.  There are old cerebellar infarctions.  No sign of acute infarction, mass lesion, hemorrhage, hydrocephalus or extra-axial collection.  Calvarium is unremarkable.  Sinuses  are clear.  IMPRESSION: No evidence of acute or reversible findings.  Old left hemispheric cortical and subcortical infarctions and old cerebellar infarctions. Intravascular contrast is present, presumably related to recent intraarterial catheter procedure.  Original Report Authenticated By: Thomasenia Sales, M.D.   Dg Chest Port 1 View  02/21/2011  *RADIOLOGY REPORT*  Clinical Data: 66 year old female status post IJ line placement.  PORTABLE CHEST - 1 VIEW  Comparison: 02/19/2011 and earlier.  Findings: AP portable semi upright view at 2055 hours.  Unchanged position of the left IJ central line tip which is above the left sternoclavicular joint level at the thoracic inlet.  No pneumothorax. Stable cardiomegaly and mediastinal contours.  No acute pulmonary opacity.  IMPRESSION: Left IJ central line remains high, tip just above the thoracic inlet.  This should be advanced or removed and replaced.  Stable chest.  Original Report  Authenticated By: Harley Hallmark, M.D.   Dg Fluoro Guide Lumbar Puncture  02/21/2011  *RADIOLOGY REPORT*  Clinical Data:  Altered mental status.  DIAGNOSTIC LUMBAR PUNCTURE UNDER FLUOROSCOPIC GUIDANCE  Fluoroscopy time:  0.16 minutes minutes.  Technique:  Informed consent was obtained from the patient prior to the procedure, including potential complications of headache, allergy, and pain.   With the patient prone, the lower back was prepped with Betadine.  1% Lidocaine was used for local anesthesia. Lumbar puncture was performed at the left paramidline L2-3 level using a 22 gauge needle with return of clear CSF with an opening pressure of 16 cm water, within normal limits.   11.0 ml of CSF were obtained for laboratory studies.  The patient tolerated the procedure well and there were no apparent complications.  IMPRESSION: Technically successful fluoroscopic guided lumbar puncture via a left paramidline approach at L2-3.  Original Report Authenticated By: Jamesetta Orleans. MATTERN, M.D.    Review of Systems  Unable to perform ROS: other   Blood pressure 155/63, pulse 87, temperature 98.9 F (37.2 C), temperature source Oral, resp. rate 11, height 5\' 2"  (1.575 m), weight 61.8 kg (136 lb 3.9 oz), SpO2 99.00%. Physical Exam  Assessment/Plan:  Discussed with RN, Dr. Eben Burow present Pt just diagnosed with MI Cardiology being called.  Discussed with daughter Rubye Oaks who has power of attorney. Pt is seen lying in bed  5:10 pm 02/2011  [name] says she was just starting first PT when she slumped, seem to lose consciousness and then complained of chest pain/breathing . She has been noticeable more confused at home and she was brought to [facility].  Cardiac conditions, COPD and renal insufficiency confound her risk for MI RECOMMENDATION: 1. Pt in acute distress.  Will follow patient   Wolfe Camarena 02/23/2011, 12:08 AM

## 2011-02-24 ENCOUNTER — Inpatient Hospital Stay (HOSPITAL_COMMUNITY): Payer: Medicare Other

## 2011-02-24 LAB — RENAL FUNCTION PANEL
Albumin: 2.7 g/dL — ABNORMAL LOW (ref 3.5–5.2)
GFR calc Af Amer: 5 mL/min — ABNORMAL LOW (ref >90)
GFR calc non Af Amer: 4 mL/min — ABNORMAL LOW (ref >90)
Glucose, Bld: 86 mg/dL (ref 70–99)
Phosphorus: 5.9 mg/dL — ABNORMAL HIGH (ref 2.3–4.6)
Potassium: 5 mEq/L (ref 3.5–5.1)
Sodium: 129 mEq/L — ABNORMAL LOW (ref 135–145)

## 2011-02-24 LAB — CULTURE, BLOOD (ROUTINE X 2): Culture: NO GROWTH

## 2011-02-24 LAB — CSF CULTURE W GRAM STAIN: Culture: NO GROWTH

## 2011-02-24 LAB — CBC
MCHC: 31 g/dL (ref 30.0–36.0)
Platelets: 131 10*3/uL — ABNORMAL LOW (ref 150–400)
RDW: 18.4 % — ABNORMAL HIGH (ref 11.5–15.5)
WBC: 4.4 10*3/uL (ref 4.0–10.5)

## 2011-02-24 NOTE — Progress Notes (Signed)
Subjective: No acute events overnight. Patient slept well. She denies any chest pain or shortness of breath. Spoke with the patient's daughter Rubye Oaks). The daughter believes the patient is improved from admission but not back to baseline. The patient's mental status waxes and wanes. She is most lethargic in the morning and after physical therapy. Daughter says the patient continues to make odd comments. For example, the patient often speaks with the nurses about close family friends as though the nurses are intimately familiar with those same friends.   Objective: Vital signs in last 24 hours: Filed Vitals:   02/24/11 0700 02/24/11 0800 02/24/11 0816 02/24/11 0821  BP: 179/88 155/70 155/70   Pulse: 60 58 62   Temp:  97.6 F (36.4 C)    TempSrc:  Oral    Resp:      Height:      Weight:      SpO2: 100% 97%  99%   Physical Exam: Head: Normocephalic, Atraumatic.  Eyes: Pupils equally round and reactive to light bilaterally. Sclera non-injected and anicteric.  Neck: Trachea midline. No cervical lymphadenopathy. No thyroidmegaly. No JVD.  Pulmonary: Clear to auscultation bilaterally. Breath sounds heard bilaterally. No wheezing, rales, or rhonchii.  Cardiac: Regular rate and rhythms. No murmurs, rubs, or gallops. Normal S1 and S2.  Extremities: Patient's grafts are non-erythematous and non-tender. Femoral catheter access site does not have hematoma or bruit. No cyanosis or clubbing.  Neuro: Moving all extremities. Can grip fingers equivalently bilaterally.  Psych: Alert. Orientated to person, time, and place. Conversant, but makes slightly delusional comments. Able to eat food by herself.   Studies/Results: Dg Chest Port 1 View (02/23/2011) FINDINGS: Removal of left IJ venous access.  Stable heart size with mild central vascular congestion.  Minimal basilar atelectasis.  No focal pneumonia, collapse, consolidation, effusion or pneumothorax. Trachea midline.  IMPRESSION: Stable chest exam.  Removal  of left IJ central venous access.    Medications: I have reviewed the patient's current medications. Scheduled Meds:   . amLODipine  10 mg Oral Daily  . antiseptic oral rinse  15 mL Mouth Rinse BID  . aspirin  81 mg Oral Daily  . carvedilol  25 mg Oral BID WC  . cefUROXime  500 mg Oral BID WC  . feeding supplement (NEPRO CARB STEADY)  237 mL Oral TID BM  . Fluticasone-Salmeterol  1 puff Inhalation Q12H  . hydrALAZINE  25 mg Oral BID  . levothyroxine  88 mcg Oral QAC breakfast  . pantoprazole  40 mg Oral Q1200  . paricalcitol  6 mcg Intravenous Q M,W,F-HD  . sevelamer  1,600 mg Oral TID WC  . simvastatin  20 mg Oral q1800  . sodium chloride  3 mL Intravenous Q12H  . DISCONTD: albuterol  2.5 mg Nebulization Q6H  . DISCONTD: ipratropium  0.5 mg Nebulization Q6H   Continuous Infusions:  PRN Meds:.albuterol, feeding supplement (NEPRO CARB STEADY), haloperidol lactate, heparin, lidocaine, lidocaine-prilocaine, pentafluoroprop-tetrafluoroeth Assessment/Plan: 1. Stress Induced Cardiomyopathy: Patient was able to tolerate getting out of her bed yesterday without any episodes of tachycardia. However, after physical therapy she becomes somnolent. 2 days ago patient presented with tachycardia and somnolence after PT, and subsequent 12-lead EKG showed ST-elevations in an anteriolateral distribution. Patient's post exercise somnolence could also be due to deconditioning as well.  -- Up to chair as tolerated.  -- If difficult to arouse after PT, EKG once.  -- Being followed by cardiology, we appreciate their input.  -- Repeat Echocardiogram in 2-4 weeks  --  Moving from CICU to step down today.   2. Altered Mental Status: Altered mental status is similar to yesterday. She is alert, orientated, but has slight delusions. She follows commands. Medical work-up remains negative. Continuing to minimize psychoactive medications.  -- Minimize sedating medications  -- Nepro TID between meals, per nutrition  recommendations. We appreciate their input.  -- Seen by psychiatry yesterday. They recommend trazadone for sleep, minimization of Ativan, and outpatient follow-up. We appreciate their input.   3. Health Care Associated Pneumonia: Patient is currently being covered with Cefuroxime. She has been afebrile. She denies SOB. CXR does not show any evidence of consolidation.  -- Last dose of Cefuroxime will be today after dialysis.   4. Hypertension: Patient has a long standing history of hypertension with a baseline SBP 160-170. Patient's blood pressure was 148/59 in the morning. Patient was recently converted back to her home regimen. Will continue to monitor and optimize.  -- Maintain Carvedilol 25mg  po bid  -- Maintain Hydralazine 25mg  po bid  -- Maintain Amlodipine 10mg  po daily   5. End Stage Renal Disease: Patient has ESRD with MWF hemodialysis. Getting hemodialysis today. Getting therapy to lower phosphorus levels.  -- Being followed by nephrology, we appreciate their input.  -- Maintain Paricalcitol iv qMWF during HD.  -- Maintain Renvela 800--2 TID with meals   6. R Renal Mass with History of left RCC: Spoke with radiology. They advise getting abdominal MRI, which patient has improved mental status, because abdominal MRI requires a high level of patient participation.  -- Patient to receive abdominal MRI when her mental status improves and she can cooperate with exam.   7. Coronary Artery Disease: Patient has a history of CVA in 2003, CAD with a stent to her RCA in 2007, and iliac stenting. Given the patient's prior history, family history, 106 pack year history of smoking, ESRD, and uncontrolled hypertension; she is at high risk for further vascular insults. Her most recent lipid panel (02/22/11) wasTotal Chol 159, TG 140, HDL 44, LDL 87.  -- Maintain Simvastatin 20mg  po daily  -- Maintain ASA 81mg  daily   8. COPD: Patient has a history of COPD. She has not had any shortness of breath.    -- Maintain Advair 1 puff daily  -- Maintain duoneb q6h    9. Hypothyroidism: Patient has a history of hypothyroidism. Her TSH on admission was 1.536.  -- Maintain Synthroid po daily   Dispo: Likely SNF Monday.    LOS: 7 days   Wilmer Floor 02/24/2011, 9:45 AM

## 2011-02-24 NOTE — Progress Notes (Signed)
Subjective: Awake, talking with daughter [name]).  Wants to go home  Objective: Vital signs in last 24 hours: Blood pressure 148/59, pulse 58, temperature 97.6 F (36.4 C), temperature source Oral, resp. rate 16, height 5\' 2"  (1.575 m), weight 64.3 kg (141 lb 12.1 oz), SpO2 96.00%.   Intake/Output from previous day: 02/21 0701 - 02/22 0700 In: 1020 [P.O.:1020] Out: -  Intake/Output this shift: Total I/O In: 223 [P.O.:220; I.V.:3] Out: -  Weight change: -1 kg (-2 lb 3.3 oz)   PHYSICAL EXAM General--awake,alert Chest--clear Heart--no rub Abd--nontender Extr--no edema, AVG patent L upper arm  Lab Results:   Lab 02/22/11 1815 02/22/11 0809 02/21/11 1236 02/20/11 0821  NA 131* 135 140 --  K 3.8 3.9 4.2 --  CL 93* 96 99 --  CO2 27 23 25  --  BUN 16 42* 30* --  CREATININE 4.48* 8.76* 7.26* --  EGFR -- -- -- --  GLUCOSE 128* -- -- --  CALCIUM 11.0* 11.1* 11.3* --  PHOS -- 6.7* 7.0* 8.5*      Basename 02/22/11 1815 02/22/11 0809  WBC 4.7 5.1  HGB 13.1 12.8  HCT 42.0 40.7  PLT 80* 97*      Assessment/Plan: 1. AMS- old strokes by CT, prob TME. May have some early underlying dementia per family. Sen by psychiatry who suggests minimize ativan  And begin Trazodone (antidepressant which is also good for sleep) 2. ESRD - HD 20 Feb, CXR shows improved PNA. attempt 1 kg more as tolerated. Weight 61.8 kg post HD 20 Feb (EDW 71 kg as outpatient)--try for weight 61 kg today 3. Hypertension- amlodipine 10/d, carvedilol 25 BID, hydralazine 25 BID.  If BP continues to be high would add ACE inhib  4. Fever/pulm infiltrates- now on po ceftin. Check CXR today (PA and Lat)  5. Metabolic bone disease - off zemplar andon non-Ca phosphate  binders , Ca and phos both still HIGH. Using low Ca bath in HD plus non-Ca phos binder .  Check today in HD 6. Hx of renal cell Ca, s/p L nephrectomy--will need repeat MRI while in hospital--I will order MRI. High Ca could possibly be from renal cell ca    7. HO CVA 2003  8. Hx CAD/MI stent 2007--cardiac cath yesterday as noted above  9. Hx of HIT 2007    LOS: 7 days   Anntoinette Haefele F 02/24/2011,10:49 AM   .labalb

## 2011-02-24 NOTE — Progress Notes (Signed)
[name] continues to have improvement in mentation although still not quite back to baseline. She was able to tolerate working with PT without chest pain. Will continue to have her work with PT/OT. Will avoid sedatives. Likely to go to SNF early next week, barring any new events.  Duke Salvia Drue Second MD MPH [facility] [phone]

## 2011-02-24 NOTE — Progress Notes (Signed)
Pt was brought to MRI Dept. MRI Abdomen w.o cm was attempted. Pt refused after one series of images. Dr Pecolia Ades ; Radiologist has spoken Supervising Nephrologist on 02/23/11 about the necessity of this exam. Pt has had a CT w/wo cm of and Radiologist felt confident that MRI w/o gadolinium would not add any additional insight to the mass on pt's residual kidney than the CT did. I spoke to pt's RN Jacki Cones prior to returning pt to her room 2917. I also spoke w Jacki Cones again on the floor and relayed the information above to her.  -Eustaquio Boyden RT,R,MR

## 2011-02-24 NOTE — Progress Notes (Signed)
Chaplain made brief initial visit with patient and her daughter. Chaplain listened and offered emotional support. Patient stated that she is coping okay. No follow up needed.

## 2011-02-24 NOTE — Progress Notes (Signed)
Clinical Social Work-CSW following-pt not yet out of step down-CSW attempting to provide bed offers to dtr-CSW will leave report for weekend coverage to provide bed offers (if able) in preparation for d/c on Monday-CSW following-[name]-MSW, [phone]

## 2011-02-24 NOTE — ED Provider Notes (Signed)
I saw and evaluated the patient, reviewed the resident's note and I agree with the findings and plan.  I personally reviewed the patient's EKG and agree with the resident's documentation.  Pt with AMS and hypoxia. Possible hypertensive encephalopathy or from hypoxia or infection. CXR suggestive of possibel pneumonia. ABx and admission.  Raeford Razor, MD 02/24/11 1750

## 2011-02-24 NOTE — Consult Note (Signed)
Reason for Consult   Referring Physician:    [name] is an 66 y.o. female.  HPI: Pt has had progressive mental changes noted by family. She had an episode of weakness, mental confusion. She was to go to MRI with her daughter [name]. She knew where to go but was unable to give verbal directions to and from the MRI appointment. She was brought to ED for evaluation. She had an acute MI yesterday and is stable today after cardiac cath.   AXIS I  Mental Status Changes, Depression due to medical conditions AXIS II Deferred AXIS III Past Medical History  Diagnosis Date  . CAD (coronary artery disease)     stent to RCA  . CVA (cerebral infarction) 2003    no apparent residual  . Renal failure     related to hypertension  . Hypothyroidism   . PVD (peripheral vascular disease)   . Hyperlipidemia   . Positive PPD     completed rifampin  . Diastolic congestive heart failure   . Renal insufficiency   . Hypertension   . COPD (chronic obstructive pulmonary disease) t  . Cancer     clear cell cancer, kidney  . Complication of anesthesia 12/2010    pt is very confused, with AMS with anesthesia    Past Surgical History  Procedure Date  . Abdominal hysterectomy   . Cholecystectomy   . D&cs   . Right ankle repair   . Left nephrectomy   . Vascular surgery 11/2010    graft inserted to left arm  AXID IV Renal insufficiency, family support AXIS V  GAF 45  Family History  Problem Relation Age of Onset  . Heart disease Father   . Hypertension Mother   . Dementia Mother   . Coronary artery disease Sister     Social History:  reports that she has been smoking Cigarettes.  She has a 106 pack-year smoking history. She does not have any smokeless tobacco history on file. She reports that she does not drink alcohol or use illicit drugs.  Allergies:  Allergies  Allergen Reactions  . Iohexol Swelling and Other (See Comments)    1970s; passed out and had facial/tongue swelling.  Requires  13-hour prep with prednisone and benadryl  . Ampicillin Hives and Rash  . Meperidine Hcl Swelling and Rash    Makes tongue swell  . Morphine Rash  . Penicillins Rash  . Heparin     MDs told her not to take after reaction in ICU  . Pentazocine Lactate     Patient does not remember reaction to this med (Talwin).   . Phenytoin Other (See Comments)    Had reaction while in ICU; doesn't know.  MDs told her not to take ever again.  . Ace Inhibitors Rash    Medications: I have reviewed patients current medications.   Results for orders placed during the hospital encounter of 02/17/11 (from the past 48 hour(s))  CARDIAC PANEL(CRET KIN+CKTOT+MB+TROPI)     Status: Abnormal   Collection Time   02/23/11  2:44 AM      Component Value Range Comment   Total CK 52  7 - 177 (U/L)    CK, MB 5.6 (*) 0.3 - 4.0 (ng/mL)    Troponin I <0.30  <0.30 (ng/mL)    Relative Index RELATIVE INDEX IS INVALID  0.0 - 2.5    CARDIAC PANEL(CRET KIN+CKTOT+MB+TROPI)     Status: Abnormal   Collection Time   02/23/11 10:35 AM  Component Value Range Comment   Total CK 35  7 - 177 (U/L)    CK, MB 4.9 (*) 0.3 - 4.0 (ng/mL)    Troponin I <0.30  <0.30 (ng/mL)    Relative Index RELATIVE INDEX IS INVALID  0.0 - 2.5    RENAL FUNCTION PANEL     Status: Abnormal   Collection Time   02/24/11  1:07 PM      Component Value Range Comment   Sodium 129 (*) 135 - 145 (mEq/L)    Potassium 5.0  3.5 - 5.1 (mEq/L)    Chloride 88 (*) 96 - 112 (mEq/L)    CO2 26  19 - 32 (mEq/L)    Glucose, Bld 86  70 - 99 (mg/dL)    BUN 49 (*) 6 - 23 (mg/dL)    Creatinine, Ser 4.54 (*) 0.50 - 1.10 (mg/dL) DELTA CHECK NOTED   Calcium 11.4 (*) 8.4 - 10.5 (mg/dL)    Phosphorus 5.9 (*) 2.3 - 4.6 (mg/dL)    Albumin 2.7 (*) 3.5 - 5.2 (g/dL)    GFR calc non Af Amer 4 (*) >90 (mL/min)    GFR calc Af Amer 5 (*) >90 (mL/min)   CBC     Status: Abnormal   Collection Time   02/24/11  1:07 PM      Component Value Range Comment   WBC 4.4  4.0 - 10.5 (K/uL)     RBC 4.94  3.87 - 5.11 (MIL/uL)    Hemoglobin 12.3  12.0 - 15.0 (g/dL)    HCT 09.8  11.9 - 14.7 (%)    MCV 80.4  78.0 - 100.0 (fL)    MCH 24.9 (*) 26.0 - 34.0 (pg)    MCHC 31.0  30.0 - 36.0 (g/dL)    RDW 82.9 (*) 56.2 - 15.5 (%)    Platelets 131 (*) 150 - 400 (K/uL) PLATELET COUNT CONFIRMED BY SMEAR    Dg Chest 2 View  02/24/2011  *RADIOLOGY REPORT*  Clinical Data: Follow up CHF post dialysis.  CHEST - 2 VIEW 02/24/2011:  Comparison: Portable chest x-rays yesterday and dating back to 02/19/2011.  Findings: Cardiac silhouette enlarged but stable.  Pulmonary vascularity now normal without evidence of pulmonary edema.  No visible pleural effusions.  Minimal linear atelectasis the left lung base.  Lungs otherwise clear.  IMPRESSION: Stable cardiomegaly.  Resolution of pulmonary edema.  Minimal atelectasis at the left lung base.  No acute cardiopulmonary disease otherwise.  Original Report Authenticated By: Arnell Sieving, M.D.   Dg Chest Port 1 View  02/23/2011  *RADIOLOGY REPORT*  Clinical Data: Cough, possible pneumonia  PORTABLE CHEST - 1 VIEW  Comparison: 02/21/2011  Findings: Removal of left IJ venous access.  Stable heart size with mild central vascular congestion.  Minimal basilar atelectasis.  No focal pneumonia, collapse, consolidation, effusion or pneumothorax. Trachea midline.  IMPRESSION: Stable chest exam.  Removal of left IJ central venous access.  Original Report Authenticated By: Judie Petit. Ruel Favors, M.D.    Review of Systems  Unable to perform ROS: other   Blood pressure 126/53, pulse 76, temperature 98.3 F (36.8 C), temperature source Oral, resp. rate 20, height 5\' 2"  (1.575 m), weight 60.6 kg (133 lb 9.6 oz), SpO2 98.00%. Physical Exam  Assessment/Plan: chart reviewed Pt is unavailable.  Pt had been taken to hemodialysis RECOMMENDATION: 1. Will follow patient.   [name] 02/24/2011, 10:44 PM

## 2011-02-24 NOTE — Consult Note (Signed)
Reason for Consult:Altered Mental Status Referring Physician: Dr. Daisey Must Lingelbach is an 66 y.o. female.  HPI: Pt has had progressive mental changes noted by family.  She had an episode of weakness, mental confusion.  She was to go to MRI with her daughter Rubye Oaks.  She knew where to go but was unable to give verbal directions to and from the MRI appointment.  She was brought to ED for evaluation. She had an acute MI yesterday and is stable today after cardiac cath.   AXIS I  Mental Status Change  AXIS  II Deferred AXIS III Past Medical History  Diagnosis Date  . CAD (coronary artery disease)     stent to RCA  . CVA (cerebral infarction) 2003    no apparent residual  . Renal failure     related to hypertension  . Hypothyroidism   . PVD (peripheral vascular disease)   . Hyperlipidemia   . Positive PPD     completed rifampin  . Diastolic congestive heart failure   . Renal insufficiency   . Hypertension   . COPD (chronic obstructive pulmonary disease) t  . Cancer     clear cell cancer, kidney  . Complication of anesthesia 12/2010    pt is very confused, with AMS with anesthesia    Past Surgical History  Procedure Date  . Abdominal hysterectomy   . Cholecystectomy   . D&cs   . Right ankle repair   . Left nephrectomy   . Vascular surgery 11/2010    graft inserted to left arm  AXIS IV  Complex medical conditions, mental health, supportive family  AXIS V  GAF 40    Family History  Problem Relation Age of Onset  . Heart disease Father   . Hypertension Mother   . Dementia Mother   . Coronary artery disease Sister     Social History:  reports that she has been smoking Cigarettes.  She has a 106 pack-year smoking history. She does not have any smokeless tobacco history on file. She reports that she does not drink alcohol or use illicit drugs.  Allergies:  Allergies  Allergen Reactions  . Iohexol Swelling and Other (See Comments)    1970s; passed out and had  facial/tongue swelling.  Requires 13-hour prep with prednisone and benadryl  . Ampicillin Hives and Rash  . Meperidine Hcl Swelling and Rash    Makes tongue swell  . Morphine Rash  . Penicillins Rash  . Heparin     MDs told her not to take after reaction in ICU  . Pentazocine Lactate     Patient does not remember reaction to this med (Talwin).   . Phenytoin Other (See Comments)    Had reaction while in ICU; doesn't know.  MDs told her not to take ever again.  . Ace Inhibitors Rash    Medications: I have reviewed patient's current medications  Results for orders placed during the hospital encounter of 02/17/11 (from the past 48 hour(s))  CBC     Status: Abnormal   Collection Time   02/22/11  8:09 AM      Component Value Range Comment   WBC 5.1  4.0 - 10.5 (K/uL)    RBC 5.07  3.87 - 5.11 (MIL/uL)    Hemoglobin 12.8  12.0 - 15.0 (g/dL)    HCT 91.4  78.2 - 95.6 (%)    MCV 80.3  78.0 - 100.0 (fL)    MCH 25.2 (*) 26.0 - 34.0 (pg)  MCHC 31.4  30.0 - 36.0 (g/dL)    RDW 16.1 (*) 09.6 - 15.5 (%)    Platelets 97 (*) 150 - 400 (K/uL)   COMPREHENSIVE METABOLIC PANEL     Status: Abnormal   Collection Time   02/22/11  8:09 AM      Component Value Range Comment   Sodium 135  135 - 145 (mEq/L)    Potassium 3.9  3.5 - 5.1 (mEq/L)    Chloride 96  96 - 112 (mEq/L)    CO2 23  19 - 32 (mEq/L)    Glucose, Bld 101 (*) 70 - 99 (mg/dL)    BUN 42 (*) 6 - 23 (mg/dL)    Creatinine, Ser 0.45 (*) 0.50 - 1.10 (mg/dL)    Calcium 40.9 (*) 8.4 - 10.5 (mg/dL)    Total Protein 6.6  6.0 - 8.3 (g/dL)    Albumin 2.7 (*) 3.5 - 5.2 (g/dL)    AST 25  0 - 37 (U/L)    ALT 17  0 - 35 (U/L)    Alkaline Phosphatase 61  39 - 117 (U/L)    Total Bilirubin 0.4  0.3 - 1.2 (mg/dL)    GFR calc non Af Amer 4 (*) >90 (mL/min)    GFR calc Af Amer 5 (*) >90 (mL/min)   PHOSPHORUS     Status: Abnormal   Collection Time   02/22/11  8:09 AM      Component Value Range Comment   Phosphorus 6.7 (*) 2.3 - 4.6 (mg/dL)     URINALYSIS, ROUTINE W REFLEX MICROSCOPIC     Status: Abnormal   Collection Time   02/22/11  4:31 PM      Component Value Range Comment   Color, Urine YELLOW  YELLOW     APPearance CLEAR  CLEAR     Specific Gravity, Urine 1.015  1.005 - 1.030     pH 8.5 (*) 5.0 - 8.0     Glucose, UA 100 (*) NEGATIVE (mg/dL)    Hgb urine dipstick SMALL (*) NEGATIVE     Bilirubin Urine NEGATIVE  NEGATIVE     Ketones, ur NEGATIVE  NEGATIVE (mg/dL)    Protein, ur 811 (*) NEGATIVE (mg/dL)    Urobilinogen, UA 0.2  0.0 - 1.0 (mg/dL)    Nitrite NEGATIVE  NEGATIVE     Leukocytes, UA MODERATE (*) NEGATIVE    URINE MICROSCOPIC-ADD ON     Status: Abnormal   Collection Time   02/22/11  4:31 PM      Component Value Range Comment   Squamous Epithelial / LPF RARE  RARE     WBC, UA 7-10  <3 (WBC/hpf)    RBC / HPF 3-6  <3 (RBC/hpf)    Bacteria, UA FEW (*) RARE    CBC     Status: Abnormal   Collection Time   02/22/11  6:15 PM      Component Value Range Comment   WBC 4.7  4.0 - 10.5 (K/uL)    RBC 5.21 (*) 3.87 - 5.11 (MIL/uL)    Hemoglobin 13.1  12.0 - 15.0 (g/dL)    HCT 91.4  78.2 - 95.6 (%)    MCV 80.6  78.0 - 100.0 (fL)    MCH 25.1 (*) 26.0 - 34.0 (pg)    MCHC 31.2  30.0 - 36.0 (g/dL)    RDW 21.3 (*) 08.6 - 15.5 (%)    Platelets 80 (*) 150 - 400 (K/uL) CONSISTENT WITH PREVIOUS RESULT  COMPREHENSIVE METABOLIC PANEL  Status: Abnormal   Collection Time   02/22/11  6:15 PM      Component Value Range Comment   Sodium 131 (*) 135 - 145 (mEq/L)    Potassium 3.8  3.5 - 5.1 (mEq/L)    Chloride 93 (*) 96 - 112 (mEq/L)    CO2 27  19 - 32 (mEq/L)    Glucose, Bld 128 (*) 70 - 99 (mg/dL)    BUN 16  6 - 23 (mg/dL) DELTA CHECK NOTED   Creatinine, Ser 4.48 (*) 0.50 - 1.10 (mg/dL) DELTA CHECK NOTED   Calcium 11.0 (*) 8.4 - 10.5 (mg/dL)    Total Protein 6.5  6.0 - 8.3 (g/dL)    Albumin 2.7 (*) 3.5 - 5.2 (g/dL)    AST 27  0 - 37 (U/L)    ALT 20  0 - 35 (U/L)    Alkaline Phosphatase 59  39 - 117 (U/L)    Total  Bilirubin 0.4  0.3 - 1.2 (mg/dL)    GFR calc non Af Amer 9 (*) >90 (mL/min)    GFR calc Af Amer 11 (*) >90 (mL/min)   LIPID PANEL     Status: Normal   Collection Time   02/22/11  6:15 PM      Component Value Range Comment   Cholesterol 159  0 - 200 (mg/dL)    Triglycerides 782  <150 (mg/dL)    HDL 44  >95 (mg/dL)    Total CHOL/HDL Ratio 3.6      VLDL 28  0 - 40 (mg/dL)    LDL Cholesterol 87  0 - 99 (mg/dL)   PROTIME-INR     Status: Abnormal   Collection Time   02/22/11  6:15 PM      Component Value Range Comment   Prothrombin Time 16.0 (*) 11.6 - 15.2 (seconds)    INR 1.25  0.00 - 1.49    APTT     Status: Abnormal   Collection Time   02/22/11  6:15 PM      Component Value Range Comment   aPTT 47 (*) 24 - 37 (seconds)   CARDIAC PANEL(CRET KIN+CKTOT+MB+TROPI)     Status: Abnormal   Collection Time   02/22/11  6:15 PM      Component Value Range Comment   Total CK 83  7 - 177 (U/L)    CK, MB 6.3 (*) 0.3 - 4.0 (ng/mL)    Troponin I <0.30  <0.30 (ng/mL)    Relative Index RELATIVE INDEX IS INVALID  0.0 - 2.5    CARDIAC PANEL(CRET KIN+CKTOT+MB+TROPI)     Status: Abnormal   Collection Time   02/22/11  7:23 PM      Component Value Range Comment   Total CK 95  7 - 177 (U/L)    CK, MB 6.7 (*) 0.3 - 4.0 (ng/mL)    Troponin I 0.33 (*) <0.30 (ng/mL)    Relative Index RELATIVE INDEX IS INVALID  0.0 - 2.5    MRSA PCR SCREENING     Status: Normal   Collection Time   02/22/11  7:28 PM      Component Value Range Comment   MRSA by PCR NEGATIVE  NEGATIVE    CARDIAC PANEL(CRET KIN+CKTOT+MB+TROPI)     Status: Abnormal   Collection Time   02/23/11  2:44 AM      Component Value Range Comment   Total CK 52  7 - 177 (U/L)    CK, MB 5.6 (*) 0.3 - 4.0 (ng/mL)  Troponin I <0.30  <0.30 (ng/mL)    Relative Index RELATIVE INDEX IS INVALID  0.0 - 2.5    CARDIAC PANEL(CRET KIN+CKTOT+MB+TROPI)     Status: Abnormal   Collection Time   02/23/11 10:35 AM      Component Value Range Comment   Total CK 35  7  - 177 (U/L)    CK, MB 4.9 (*) 0.3 - 4.0 (ng/mL)    Troponin I <0.30  <0.30 (ng/mL)    Relative Index RELATIVE INDEX IS INVALID  0.0 - 2.5      Ct Head Wo Contrast  02/22/2011  *RADIOLOGY REPORT*  Clinical Data: Weakness.  Altered mental status.  CT HEAD WITHOUT CONTRAST  Technique:  Contiguous axial images were obtained from the base of the skull through the vertex without contrast.  Comparison: MRI 02/20/2011.  Head CT 02/17/2011.  Findings: There is intravascular contrast present.  Presumably, the patient has had a recent arterial catheterization procedure.  The brain shows generalized atrophy.  There are old cortical and subcortical infarctions in the left parietal and posterior frontal region.  There are old cerebellar infarctions.  No sign of acute infarction, mass lesion, hemorrhage, hydrocephalus or extra-axial collection.  Calvarium is unremarkable.  Sinuses are clear.  IMPRESSION: No evidence of acute or reversible findings.  Old left hemispheric cortical and subcortical infarctions and old cerebellar infarctions. Intravascular contrast is present, presumably related to recent intraarterial catheter procedure.  Original Report Authenticated By: Thomasenia Sales, M.D.   Dg Chest Port 1 View  02/23/2011  *RADIOLOGY REPORT*  Clinical Data: Cough, possible pneumonia  PORTABLE CHEST - 1 VIEW  Comparison: 02/21/2011  Findings: Removal of left IJ venous access.  Stable heart size with mild central vascular congestion.  Minimal basilar atelectasis.  No focal pneumonia, collapse, consolidation, effusion or pneumothorax. Trachea midline.  IMPRESSION: Stable chest exam.  Removal of left IJ central venous access.  Original Report Authenticated By: Judie Petit. Ruel Favors, M.D.    Review of Systems  Unable to perform ROS: other   Blood pressure 129/67, pulse 63, temperature 98.3 F (36.8 C), temperature source Oral, resp. rate 14, height 5\' 2"  (1.575 m), weight 61.8 kg (136 lb 3.9 oz), SpO2 96.00%. Physical  Exam  Assessment/Plan: Chart reviewed. Pt is interviewed 5:30 pm 02/23/11  [name] her daughter is present Pt is sitting in a chair with head down.  She is awake, drowsy.  She responds to some questions and says she feels better today.  She begins talking about a chest in the room and makes no sense.  Her daughter Rubye Oaks says that is what she has been doing.  Before coming to ED she was to go for an MRI.  She knew the way but she was unable to describe it to her daughter.  She was not able to describe directions to or from MRI.  [name] reports pt take lots of xanax to go to sleep. Home meds say only 2 mg daily prn.  She is suspected of being addicted to more than prescribed Xanax which can cause confusion and sedation. It is not a recommended (NIH Consensus) hypnotic. Mental status examination is suspended until pt is more alert. RECOMMENDATIONS; 1. Consider minimizing use of ativan, only prn when needed for agitation 2. Consider trazodone 50 mg at bedtime 3. Will follow patient.   Lomax Poehler 02/24/2011, 1:35 AM

## 2011-02-25 NOTE — Progress Notes (Addendum)
[facility]    Subjective: Awake, alert, [name] 2 ("2013, [facility],  [place]"), watching TV.  Says her daughters are at home  Objective: Vital signs in last 24 hours: Blood pressure 103/87, pulse 70, temperature 98.9 F (37.2 C), temperature source Oral, resp. rate 20, height 5\' 2"  (1.575 m), weight 60.6 kg (133 lb 9.6 oz), SpO2 100.00%.    PHYSICAL EXAM General--awake, alert Chest--clear Heart--no rub Abd--noontender Extr--no edema, AVG patent L upper arm  Lab Results:   Lab 02/24/11 1307 02/22/11 1815 02/22/11 0809 02/21/11 1236  NA 129* 131* 135 --  K 5.0 3.8 3.9 --  CL 88* 93* 96 --  CO2 26 27 23  --  BUN 49* 16 42* --  CREATININE 8.33* 4.48* 8.76* --  EGFR -- -- -- --  GLUCOSE 86 -- -- --  CALCIUM 11.4* 11.0* 11.1* --  PHOS 5.9* -- 6.7* 7.0*     Basename 02/24/11 1307 02/22/11 1815  WBC 4.4 4.7  HGB 12.3 13.1  HCT 39.7 42.0  PLT 131* 80*   No results found for this basename: PTH in the last 72 hours   Scheduled: Continuous:   Assessment/Plan:  1. AMS- old strokes by CT, prob TME. May have some early underlying dementia per family. Seen by psychiatry who suggests minimize ativan and begin Trazodone (antidepressant which is also good for sleep)  2. ESRD - HD 22 Feb, CXR shows improved PNA. Post HD weight yesterday 60.6 kg. Ca remains high despite 2.0 Ca bath 3. Hypertension- amlodipine 10/d, carvedilol 25 BID,  Hydralazine 25 TID.  BP much better with lower dry weight in dialysis. I will d/c hydralazine for now 4. Fever/pulm infiltrates- now on po ceftin.CXR yesterday--resolution of pulm edema, minimal L lung atelectasis. OK with me to d/c antibiotics  5. Metabolic bone disease - off zemplar and on non-Ca phosphate binders , Ca and phos both still HIGH. Using low Ca bath in HD plus non-Ca phos binder . Check Ca/phos tomorrow  6. Hx of renal cell Ca, s/p L nephrectomy--MRI ordered, but not done since she couldn't hold still.  Need to  get this done (especially wit hypercalcemia). High Ca could possibly be from renal cell ca  7. HO CVA 2003  8. Hx CAD/MI stent 2007--cardiac cathThurs as noted   9. Hx of HIT    LOS: 8 days   Sasuke Yaffe F 02/25/2011,9:23 AM   .[email or web address]

## 2011-02-25 NOTE — Progress Notes (Signed)
Subjective: No acute events overnight. Patient slept well. She denies any chest pain or shortness of breath.   Objective: Vital signs in last 24 hours: Filed Vitals:   02/25/11 0000 02/25/11 0400 02/25/11 0734 02/25/11 0756  BP: 139/75 135/61 103/87   Pulse: 67 68 70   Temp: 99.2 F (37.3 C) 98.9 F (37.2 C) 98.9 F (37.2 C)   TempSrc: Oral Oral Oral   Resp:   20   Height:      Weight:      SpO2: 93% 93% 100% 100%   Physical Exam: Head: Normocephalic, Atraumatic.  Eyes: Pupils equally round and reactive to light bilaterally. Sclera non-injected and anicteric.  Neck: Trachea midline. No cervical lymphadenopathy. No thyroidmegaly. No JVD.  Pulmonary: Clear to auscultation bilaterally. Breath sounds heard bilaterally. No wheezing, rales, or rhonchii.  Cardiac: Regular rate and rhythms. No murmurs, rubs, or gallops. Normal S1 and S2.  Extremities: Patient's grafts are non-erythematous and non-tender. Femoral catheter access site does not have hematoma or bruit. No cyanosis or clubbing.  Neuro: Moving all extremities. Can grip fingers equivalently bilaterally.  Psych: Alert. Orientated to person, time, and place. Conversant, but makes slightly delusional comments. Able to eat food by herself.   Studies/Results: Results for orders placed during the hospital encounter of 02/17/11 (from the past 24 hour(s))  RENAL FUNCTION PANEL     Status: Abnormal   Collection Time   02/24/11  1:07 PM      Component Value Range   Sodium 129 (*) 135 - 145 (mEq/L)   Potassium 5.0  3.5 - 5.1 (mEq/L)   Chloride 88 (*) 96 - 112 (mEq/L)   CO2 26  19 - 32 (mEq/L)   Glucose, Bld 86  70 - 99 (mg/dL)   BUN 49 (*) 6 - 23 (mg/dL)   Creatinine, Ser 1.61 (*) 0.50 - 1.10 (mg/dL)   Calcium 09.6 (*) 8.4 - 10.5 (mg/dL)   Phosphorus 5.9 (*) 2.3 - 4.6 (mg/dL)   Albumin 2.7 (*) 3.5 - 5.2 (g/dL)   GFR calc non Af Amer 4 (*) >90 (mL/min)   GFR calc Af Amer 5 (*) >90 (mL/min)  CBC     Status: Abnormal   Collection  Time   02/24/11  1:07 PM      Component Value Range   WBC 4.4  4.0 - 10.5 (K/uL)   RBC 4.94  3.87 - 5.11 (MIL/uL)   Hemoglobin 12.3  12.0 - 15.0 (g/dL)   HCT 04.5  40.9 - 81.1 (%)   MCV 80.4  78.0 - 100.0 (fL)   MCH 24.9 (*) 26.0 - 34.0 (pg)   MCHC 31.0  30.0 - 36.0 (g/dL)   RDW 91.4 (*) 78.2 - 15.5 (%)   Platelets 131 (*) 150 - 400 (K/uL)     Medications: I have reviewed the patient's current medications. Scheduled Meds:    . amLODipine  10 mg Oral Daily  . antiseptic oral rinse  15 mL Mouth Rinse BID  . aspirin  81 mg Oral Daily  . carvedilol  25 mg Oral BID WC  . cefUROXime  500 mg Oral BID WC  . feeding supplement (NEPRO CARB STEADY)  237 mL Oral TID BM  . Fluticasone-Salmeterol  1 puff Inhalation Q12H  . hydrALAZINE  25 mg Oral BID  . levothyroxine  88 mcg Oral QAC breakfast  . pantoprazole  40 mg Oral Q1200  . sevelamer  1,600 mg Oral TID WC  . simvastatin  20 mg Oral  [id number]  . sodium chloride  3 mL Intravenous Q12H  . DISCONTD: paricalcitol  6 mcg Intravenous Q M,W,F-HD   Continuous Infusions:  PRN Meds:.albuterol, feeding supplement (NEPRO CARB STEADY), haloperidol lactate, heparin, lidocaine, lidocaine-prilocaine, pentafluoroprop-tetrafluoroeth  Assessment/Plan: 1. Stress Induced Cardiomyopathy: Patient was able to tolerate getting out of her bed yesterday without any episodes of tachycardia. However, after physical therapy she becomes somnolent. 2 days ago patient presented with tachycardia and somnolence after PT, and subsequent 12-lead EKG showed ST-elevations in an anteriolateral distribution. Patient's post exercise somnolence could also be due to deconditioning as well.  -- Up to chair as tolerated.  -- If difficult to arouse after PT, EKG once.  -- Repeat Echocardiogram in 2-4 weeks   2. Altered Mental Status: Altered mental status is similar to yesterday. She is alert, orientated, but has slight delusions. She follows commands. Medical work-up remains  negative. Continuing to minimize psychoactive medications.  -- Minimize sedating medications  -- Nepro TID between meals, per nutrition recommendations. We appreciate their input.    3. Health Care Associated Pneumonia: Patient is currently being covered with Cefuroxime. She has been afebrile. She denies SOB. CXR does not show any evidence of consolidation.    4. Hypertension: Patient's blood pressure was 139/75 in the morning. Patient was recently converted back to her home regimen. Will continue to monitor and optimize.  -- Maintain Carvedilol 25mg  po bid  -- Maintain Hydralazine 25mg  po bid  -- Maintain Amlodipine 10mg  po daily   5. End Stage Renal Disease: Patient has ESRD with MWF hemodialysis. Getting hemodialysis today. Getting therapy to lower phosphorus levels.  -- Being followed by nephrology, we appreciate their input.  -- Maintain Paricalcitol iv qMWF during HD.  -- Maintain Renvela 800--2 TID with meals   6. R Renal Mass with History of left RCC: Spoke with radiology. They advise getting abdominal MRI, which patient has improved mental status, because abdominal MRI requires a high level of patient participation.  -- Patient to receive abdominal MRI when her mental status improves and she can cooperate with exam.   7. Coronary Artery Disease: Patient has a history of CVA in 2003, CAD with a stent to her RCA in 2007, and iliac stenting. Given the patient's prior history, family history, 106 pack year history of smoking, ESRD, and uncontrolled hypertension; she is at high risk for further vascular insults. Her most recent lipid panel (02/22/11) wasTotal Chol 159, TG 140, HDL 44, LDL 87.  -- Maintain Simvastatin 20mg  po daily  -- Maintain ASA 81mg  daily   8. COPD: Patient has a history of COPD. She has not had any shortness of breath.  -- Maintain Advair 1 puff daily  -- Maintain duoneb q6h    9. Hypothyroidism: Patient has a history of hypothyroidism. Her TSH on admission was  1.536.  -- Maintain Synthroid po daily   10. Hyponatremia: Continue to monitor.  Dispo: Likely SNF Monday.    LOS: 8 days   Breanna Shorkey 02/25/2011, 8:21 AM

## 2011-02-26 LAB — RENAL FUNCTION PANEL
Albumin: 2.8 g/dL — ABNORMAL LOW (ref 3.5–5.2)
Chloride: 90 mEq/L — ABNORMAL LOW (ref 96–112)
GFR calc non Af Amer: 5 mL/min — ABNORMAL LOW (ref >90)
Phosphorus: 3.8 mg/dL (ref 2.3–4.6)
Potassium: 4.7 mEq/L (ref 3.5–5.1)

## 2011-02-26 MED ORDER — LORAZEPAM 1 MG PO TABS
2.0000 mg | ORAL_TABLET | Freq: Every evening | ORAL | Status: DC | PRN
  Administered 2011-02-26: 2 mg via ORAL
  Filled 2011-02-26: qty 2

## 2011-02-26 NOTE — Progress Notes (Addendum)
Subjective:  No acute events overnight. Patient slept well. She denies any chest pain or shortness of breath. She says "the doctor says she would go home on Monday".   Objective: Vital signs in last 24 hours: Filed Vitals:   02/26/11 0743 02/26/11 0900 02/26/11 0946 02/26/11 0947  BP: 172/59  135/61 135/61  Pulse: 69  63   Temp: 97.6 F (36.4 C)     TempSrc: Oral     Resp: 18     Height:      Weight:      SpO2: 100% 96%     Physical Exam: Head: Normocephalic, Atraumatic.  Eyes: Pupils equally round and reactive to light bilaterally. Sclera non-injected and anicteric.  Neck: Trachea midline. No cervical lymphadenopathy. No thyroidmegaly. No JVD.  Pulmonary: Clear to auscultation bilaterally. Breath sounds heard bilaterally. No wheezing, rales, or rhonchii.  Cardiac: Regular rate and rhythms. No murmurs, rubs, or gallops. Normal S1 and S2.  Extremities: Patient's grafts are non-erythematous and non-tender. Femoral catheter access site does not have hematoma or bruit. No cyanosis or clubbing.  Neuro: Moving all extremities. Can grip fingers equivalently bilaterally.  Psych: Alert. Orientated to person, time, and place. Conversant. Able to eat food by herself.   Studies/Results: Results for orders placed during the hospital encounter of 02/17/11 (from the past 24 hour(s))  RENAL FUNCTION PANEL     Status: Abnormal   Collection Time   02/26/11  6:20 AM      Component Value Range   Sodium 129 (*) 135 - 145 (mEq/L)   Potassium 4.7  3.5 - 5.1 (mEq/L)   Chloride 90 (*) 96 - 112 (mEq/L)   CO2 26  19 - 32 (mEq/L)   Glucose, Bld 97  70 - 99 (mg/dL)   BUN 41 (*) 6 - 23 (mg/dL)   Creatinine, Ser 1.19 (*) 0.50 - 1.10 (mg/dL)   Calcium 14.7 (*) 8.4 - 10.5 (mg/dL)   Phosphorus 3.8  2.3 - 4.6 (mg/dL)   Albumin 2.8 (*) 3.5 - 5.2 (g/dL)   GFR calc non Af Amer 5 (*) >90 (mL/min)   GFR calc Af Amer 6 (*) >90 (mL/min)     Medications: I have reviewed the patient's current  medications. Scheduled Meds:    . amLODipine  10 mg Oral Daily  . antiseptic oral rinse  15 mL Mouth Rinse BID  . aspirin  81 mg Oral Daily  . carvedilol  25 mg Oral BID WC  . feeding supplement (NEPRO CARB STEADY)  237 mL Oral TID BM  . Fluticasone-Salmeterol  1 puff Inhalation Q12H  . levothyroxine  88 mcg Oral QAC breakfast  . pantoprazole  40 mg Oral Q1200  . sevelamer  1,600 mg Oral TID WC  . simvastatin  20 mg Oral q1800  . sodium chloride  3 mL Intravenous Q12H   Continuous Infusions:  PRN Meds:.albuterol, feeding supplement (NEPRO CARB STEADY), haloperidol lactate, heparin, lidocaine, lidocaine-prilocaine, pentafluoroprop-tetrafluoroeth  Assessment/Plan: 1. Stress Induced Cardiomyopathy: Patient was able to tolerate getting out of her bed without any episodes of tachycardia. 3 days ago patient presented with tachycardia and somnolence after PT, and subsequent 12-lead EKG showed ST-elevations in an anteriolateral distribution. Patient's post exercise somnolence could also be due to deconditioning. -Repeat echo in 4 weeks. -continue treatment with statin, aspirin and coreg. -continue physical therapy to help with deconditioning -D/C o SNF possibly on Monday -would transfer to regular floor today  2. Altered Mental Status: She is alert, orientated. She follows commands.  Medical work-up remains negative. Continuing to minimize psychoactive medications.  -- Minimize sedating medications  -- Nepro TID between meals, per nutrition recommendations. We appreciate their input.  -- Appreciate psych input in managing psychosis -- Will need outpatient follow up with psych   3. Health Care Associated Pneumonia: Patient is currently being covered with oral Cefuroxime (total of 9 days of therapy including today) . Her white count was  She has been afebrile. No cultures ever grew any organism.  She denies SOB. CXR does not show any evidence of consolidation so less likely to PNA as we  discussed in previous notes. Will continue abx one more day(total 10 days of therapy).   4. Hypertension: Patient's blood pressure was 135/61 in the morning. It has been very labile. Patient was recently converted back to her home regimen. Will continue to monitor and optimize. Dr. Caryn Section (renal) recommends addition of benezepril 10 mg to her current regimen but patient is allergic to ACE inhibitors (rash). -- Maintain Carvedilol 25mg  po bid  -- Maintain Hydralazine 25mg  po bid  -- Maintain Amlodipine 10mg  po daily  Will continue to monitor BP   5. End Stage Renal Disease: Patient has ESRD with MWF hemodialysis. Getting therapy to lower phosphorus levels and decrease calcium levels. High calcium could be 2/2 renal cell carcinoma. -- Being followed by nephrology, we appreciate their input.   6. R Renal Mass with History of left RCC: MRI was ordered by renal team team to evaluate the bosniac 3 tumor on the right renal lower pole. Patient was too altered to participate as noted on previous notes as a reason for not ordering it at the time of admission and subsequently.  Will get an MRI done today as patient is willing to cooperate prior to calling GU as MRI will define/differentiate the mass better.  7. Coronary Artery Disease: As above #1  8. COPD: Patient has a history of COPD. She has not had any shortness of breath.  -- Maintain Advair 1 puff daily  -- Maintain duoneb q6h    9. Hypothyroidism: Patient has a history of hypothyroidism. Her TSH on admission was 1.536.  -- Maintain Synthroid po daily   10. Hyponatremia: Asymptomatic. Continue to monitor at this time. She does not look hypovolemic. TSH normal.   Dispo: Likely SNF Monday.    LOS: 9 days   Rossi Silvestro 02/26/2011, 11:30 AM

## 2011-02-26 NOTE — Progress Notes (Signed)
[facility]    Subjective: "When can I go home?" Had dialysis on Friday 22 Feb  Objective: Vital signs in last 24 hours: Blood pressure 172/59, pulse 69, temperature 97.6 F (36.4 C), temperature source Oral, resp. rate 18, height 5\' 2"  (1.575 m), weight 60.6 kg (133 lb 9.6 oz), SpO2 100.00%.    PHYSICAL EXAM General--awake, alert, BP a little high today Chest--clear Heart--no rub Abd--nontender Extr--no edema, AVG patent L upper arm  Lab Results:   Lab 02/26/11 0620 02/24/11 1307 02/22/11 1815 02/22/11 0809  NA 129* 129* 131* --  K 4.7 5.0 3.8 --  CL 90* 88* 93* --  CO2 26 26 27  --  BUN 41* 49* 16 --  CREATININE 7.06* 8.33* 4.48* --  EGFR -- -- -- --  GLUCOSE 97 -- -- --  CALCIUM 11.6* 11.4* 11.0* --  PHOS 3.8 5.9* -- 6.7*     Basename 02/24/11 1307  WBC 4.4  HGB 12.3  HCT 39.7  PLT 131*       Continuous:   Assessment/Plan: 1. AMS- old strokes by CT, prob TME. May have some early underlying dementia per family. Seen by psychiatry who suggests minimize ativan and begin Trazodone (which has NOT been started. She looks well today 2. ESRD - HD 22 Feb, CXR shows NO PNA (indicating that CXR findings were most likely fluid overload). Post HD weight 22 Feb 60.6 kg. Ca remains high despite 2.0 Ca bath  3. Hypertension- amlodipine 10/d, carvedilol 25 BID, Hydralazine d/c'd. I'd suggest benazepril--10 mg/d to start today  4. Fever/pulm infiltrates- now on po ceftin.CXR 22 Feb--resolution of pulm edema, minimal L lung atelectasis. OK with me to d/c antibiotics  5. Metabolic bone disease - off zemplar and on non-Ca phosphate binders , Ca still HIGH. Using low Ca bath in HD plus non-Ca phos binder. Ca/Phos 11.6/3.8 today 6. Hx of renal cell Ca, s/p L nephrectomy--MRI ordered, but not done since she couldn't hold still. Discussed CT (with Dr. Si Gaul in radiology today)--CT scan done at Dr. Margrett Rud office 24 Jan showing 9.7 mm mass "Bosniak 3" in lower pole of R  kidney High Ca could  be from renal cell ca.   GU should be called to see whether R nephrectomy is indicated  7. HO CVA 2003  8. Hx CAD/MI stent 2007--cardiac cathThurs as noted  9. Hx of HIT   CBC and renal profile in HDtomorrow  LOS: 9 days   Avaiyah Strubel F 02/26/2011,9:18 AM   .labalb

## 2011-02-26 NOTE — Progress Notes (Signed)
CSW met with pt and provided bed offers. Pt reports she plans to d/c home with assist provided by family. Weekday CSW will f/u on Monday re: d/c plan.  Dellie Burns, MSW, [place] [phone] (weekend)

## 2011-02-27 ENCOUNTER — Inpatient Hospital Stay (HOSPITAL_COMMUNITY): Payer: Medicare Other

## 2011-02-27 DIAGNOSIS — D509 Iron deficiency anemia, unspecified: Secondary | ICD-10-CM | POA: Diagnosis not present

## 2011-02-27 DIAGNOSIS — I119 Hypertensive heart disease without heart failure: Secondary | ICD-10-CM | POA: Diagnosis not present

## 2011-02-27 DIAGNOSIS — Z992 Dependence on renal dialysis: Secondary | ICD-10-CM | POA: Diagnosis not present

## 2011-02-27 DIAGNOSIS — N289 Disorder of kidney and ureter, unspecified: Secondary | ICD-10-CM | POA: Diagnosis not present

## 2011-02-27 DIAGNOSIS — I5181 Takotsubo syndrome: Secondary | ICD-10-CM | POA: Diagnosis not present

## 2011-02-27 DIAGNOSIS — N186 End stage renal disease: Secondary | ICD-10-CM | POA: Diagnosis not present

## 2011-02-27 DIAGNOSIS — R4182 Altered mental status, unspecified: Secondary | ICD-10-CM | POA: Diagnosis not present

## 2011-02-27 DIAGNOSIS — Z85528 Personal history of other malignant neoplasm of kidney: Secondary | ICD-10-CM | POA: Diagnosis not present

## 2011-02-27 DIAGNOSIS — K219 Gastro-esophageal reflux disease without esophagitis: Secondary | ICD-10-CM | POA: Diagnosis not present

## 2011-02-27 DIAGNOSIS — I2510 Atherosclerotic heart disease of native coronary artery without angina pectoris: Secondary | ICD-10-CM | POA: Diagnosis not present

## 2011-02-27 DIAGNOSIS — I5032 Chronic diastolic (congestive) heart failure: Secondary | ICD-10-CM | POA: Diagnosis not present

## 2011-02-27 DIAGNOSIS — N2581 Secondary hyperparathyroidism of renal origin: Secondary | ICD-10-CM | POA: Diagnosis not present

## 2011-02-27 DIAGNOSIS — Z8701 Personal history of pneumonia (recurrent): Secondary | ICD-10-CM | POA: Diagnosis not present

## 2011-02-27 DIAGNOSIS — E039 Hypothyroidism, unspecified: Secondary | ICD-10-CM | POA: Diagnosis not present

## 2011-02-27 DIAGNOSIS — F329 Major depressive disorder, single episode, unspecified: Secondary | ICD-10-CM | POA: Diagnosis not present

## 2011-02-27 DIAGNOSIS — G40909 Epilepsy, unspecified, not intractable, without status epilepticus: Secondary | ICD-10-CM | POA: Diagnosis not present

## 2011-02-27 DIAGNOSIS — E871 Hypo-osmolality and hyponatremia: Secondary | ICD-10-CM | POA: Diagnosis not present

## 2011-02-27 DIAGNOSIS — I69959 Hemiplegia and hemiparesis following unspecified cerebrovascular disease affecting unspecified side: Secondary | ICD-10-CM | POA: Diagnosis not present

## 2011-02-27 DIAGNOSIS — I120 Hypertensive chronic kidney disease with stage 5 chronic kidney disease or end stage renal disease: Secondary | ICD-10-CM | POA: Diagnosis not present

## 2011-02-27 DIAGNOSIS — J189 Pneumonia, unspecified organism: Secondary | ICD-10-CM | POA: Diagnosis not present

## 2011-02-27 DIAGNOSIS — J449 Chronic obstructive pulmonary disease, unspecified: Secondary | ICD-10-CM | POA: Diagnosis not present

## 2011-02-27 LAB — CBC
MCV: 77.9 fL — ABNORMAL LOW (ref 78.0–100.0)
Platelets: 134 10*3/uL — ABNORMAL LOW (ref 150–400)
RBC: 4.76 MIL/uL (ref 3.87–5.11)
RDW: 17.6 % — ABNORMAL HIGH (ref 11.5–15.5)
WBC: 4.8 10*3/uL (ref 4.0–10.5)

## 2011-02-27 LAB — BASIC METABOLIC PANEL
BUN: 11 mg/dL (ref 6–23)
Calcium: 10 mg/dL (ref 8.4–10.5)
Chloride: 97 mEq/L (ref 96–112)
GFR calc non Af Amer: 13 mL/min — ABNORMAL LOW (ref >90)
Potassium: 3.7 mEq/L (ref 3.5–5.1)
Sodium: 137 mEq/L (ref 135–145)

## 2011-02-27 LAB — RENAL FUNCTION PANEL
Albumin: 2.6 g/dL — ABNORMAL LOW (ref 3.5–5.2)
Chloride: 84 mEq/L — ABNORMAL LOW (ref 96–112)
GFR calc Af Amer: 5 mL/min — ABNORMAL LOW (ref >90)
GFR calc non Af Amer: 4 mL/min — ABNORMAL LOW (ref >90)
Phosphorus: 4 mg/dL (ref 2.3–4.6)
Potassium: 5.2 mEq/L — ABNORMAL HIGH (ref 3.5–5.1)
Sodium: 121 mEq/L — ABNORMAL LOW (ref 135–145)

## 2011-02-27 LAB — ENTEROVIRUS PCR

## 2011-02-27 NOTE — Progress Notes (Signed)
Physical Therapy Treatment Note   02/27/11 1500  PT Visit Information  Last PT Received On 02/27/11  Precautions  Precautions Fall  Precaution Comments AMS  Restrictions  Weight Bearing Restrictions No  Bed Mobility  Supine to Sit 5: Supervision  Supine to Sit Details (indicate cue type and reason) increased time  Sitting - Scoot to Edge of Bed 5: Supervision  Transfers  Sit to Stand 4: Min assist (contact guard)  Stand to Sit 4: Min assist (contact guard)  Ambulation/Gait  Ambulation/Gait Yes  Ambulation/Gait Assistance 4: Min assist  Ambulation/Gait Assistance Details (indicate cue type and reason) freq standing rest breaks due to pt report "I can't get myself together." Pt c/o of dizziness. BP 117/59  Ambulation Distance (Feet) 50 Feet  Assistive device (HHA via 1 person on R side)  Gait Pattern Step-through pattern;Decreased stride length (narrow base of support)  Gait velocity decreased  Stairs No  Wheelchair Mobility  Wheelchair Mobility No  Posture/Postural Control  Posture/Postural Control No significant limitations  Static Standing Balance  Static Standing - Balance Support Right upper extremity supported  Static Standing - Level of Assistance 4: Min assist  PT - End of Session  Equipment Utilized During Treatment Gait belt  Activity Tolerance (limited by dizziness and lethargy)  Patient left in bed;with call bell in reach (pt leaving for MRI)  Nurse Communication Mobility status for ambulation (RN aware pt with increased lethargy)  General  Behavior During Session Lethargic (pt did have dialysis earlier this AM)  Cognition [facility] for tasks performed  PT - Assessment/Plan  Comments on Treatment Session Patient with decreased ataxic mvmts this date however ambulation and transfer abiltiy limited by dizziness and lethargy.  PT Plan Discharge plan remains appropriate  PT Frequency Min 3X/week  Follow Up Recommendations Skilled nursing facility  Equipment  Recommended Defer to next venue  Acute Rehab PT Goals  PT Goal: Supine/Side to Sit - Progress Progressing toward goal  PT Goal: Sit to Supine/Side - Progress Progressing toward goal  PT Goal: Sit to Stand - Progress Progressing toward goal  PT Goal: Stand to Sit - Progress Progressing toward goal  PT Goal: Ambulate - Progress Progressing toward goal  Additional Goals  PT Goal: Additional Goal #1 - Progress Progressing toward goal     Lewis Shock, PT, DPT Pager #: [phone] Office #: [phone]

## 2011-02-27 NOTE — Progress Notes (Addendum)
Subjective: No acute events overnight.  Patient states that she slept well overnight, but that she is ready to leave the hospital.  She is under the impression that a physician told her she could go back to her house, rather than to a SNF.  We re-confirmed that she will be going to SNF, and she is amenable.  Objective: Vital signs in last 24 hours: Filed Vitals:   02/27/11 0735 02/27/11 0740 02/27/11 0800 02/27/11 0830  BP: 184/74 166/68 150/63 146/62  Pulse: 57 57 61 64  Temp:      TempSrc:      Resp: 18 18 18 18   Height:      Weight:      SpO2:       Physical Exam: Head: Normocephalic, Atraumatic.  Eyes: Pupils equally round and reactive to light bilaterally. Sclera non-injected and anicteric.  Neck: Trachea midline. No cervical lymphadenopathy. No thyroidmegaly. No JVD.  Pulmonary: Clear to auscultation bilaterally. Breath sounds heard bilaterally. No wheezing, rales, or rhonchii.  Cardiac: Regular rate and rhythms. No murmurs, rubs, or gallops. Normal S1 and S2.  Extremities: Patient's grafts are non-erythematous and non-tender. Femoral catheter access site does not have hematoma or bruit. No cyanosis or clubbing.  Neuro: Moving all extremities. Can grip fingers equivalently bilaterally.  Psych: Alert. Orientated to person, time, and place. Conversant. Able to eat food by herself.   Studies/Results: No results found for this or any previous visit (from the past 24 hour(s)).   Medications: I have reviewed the patient's current medications. Scheduled Meds:    . amLODipine  10 mg Oral Daily  . antiseptic oral rinse  15 mL Mouth Rinse BID  . aspirin  81 mg Oral Daily  . carvedilol  25 mg Oral BID WC  . feeding supplement (NEPRO CARB STEADY)  237 mL Oral TID BM  . Fluticasone-Salmeterol  1 puff Inhalation Q12H  . levothyroxine  88 mcg Oral QAC breakfast  . pantoprazole  40 mg Oral Q1200  . sevelamer  1,600 mg Oral TID WC  . simvastatin  20 mg Oral q1800  . sodium chloride   3 mL Intravenous Q12H   Labs: CBC    Component Value Date/Time   WBC 4.8 02/27/2011 0800   RBC 4.76 02/27/2011 0800   HGB 11.9* 02/27/2011 0800   HCT 37.1 02/27/2011 0800   PLT 134* 02/27/2011 0800   MCV 77.9* 02/27/2011 0800   MCH 25.0* 02/27/2011 0800   MCHC 32.1 02/27/2011 0800   RDW 17.6* 02/27/2011 0800   LYMPHSABS 1.1 02/19/2011 1128   MONOABS 0.8 02/19/2011 1128   EOSABS 0.1 02/19/2011 1128   BASOSABS 0.1 02/19/2011 1128    BMET    Component Value Date/Time   NA 121* 02/27/2011 0800   K 5.2* 02/27/2011 0800   CL 84* 02/27/2011 0800   CO2 25 02/27/2011 0800   GLUCOSE 78 02/27/2011 0800   BUN 49* 02/27/2011 0800   CREATININE 8.39* 02/27/2011 0800   CALCIUM 10.9* 02/27/2011 0800   GFRNONAA 4* 02/27/2011 0800   GFRAA 5* 02/27/2011 0800     Assessment/Plan: 1. Stress Induced Cardiomyopathy: Patient has been able to tolerate getting out of her bed without any episodes of tachycardia. 4 days ago patient presented with tachycardia and somnolence after PT, and subsequent 12-lead EKG showed ST-elevations in an anteriolateral distribution. Patient's post exercise somnolence could also be due to deconditioning. -Repeat echo in 4 weeks. -continue treatment with statin, aspirin and coreg. -continue physical therapy to help  with deconditioning -D/C to SNF for further rehab  2. Altered Mental Status: She is alert, orientated. She follows commands. Medical work-up remains negative. Continuing to minimize psychoactive medications.  -- Minimize sedating medications  -- Nepro TID between meals, per nutrition recommendations. We appreciate their input.  -- Appreciate psych input in managing psychosis -- Will need outpatient follow up with psych   3. Health Care Associated Pneumonia: Patient is currently being covered with oral Cefuroxime (total of 10 days of therapy including today) . She has been afebrile. No cultures ever grew any organism.  She denies SOB. CXR does not show any evidence of  consolidation so less likely to PNA as we discussed in previous notes. -d/c Cefuroxime today  4. Hypertension: Patient's BP's have been elevated today to SBP 150-180's.  BP's have been labile over the course of his hospitalization.  Hydralazine was discontinued on 2/23, and BP's have been more elevated since that time. -- Maintain Carvedilol 25mg  po bid  -- Maintain Amlodipine 10mg  po daily  -- Patient has a listed allergy to ACE-inhibitors, so we will not start Benazepril -- consider re-starting Hydralazine, 25 mg BID  5. End Stage Renal Disease: Patient has ESRD with MWF hemodialysis. Getting therapy to lower phosphorus levels and decrease calcium levels. High calcium could be 2/2 renal cell carcinoma. -- Being followed by nephrology, we appreciate their input.  6. R Renal Mass with History of left RCC: MRI was ordered by renal team team to evaluate the bosniac 3 tumor on the right renal lower pole, but patient was initially unable to participate in study due to altered mental status. -patient is now more alert/oriented, and we believe can now participate in MRI -patient on schedule for MRI today to better evaluate mass, will consult GU in inpatient or outpatient setting as indicated  7. Coronary Artery Disease: As above #1  8. COPD: Patient has a history of COPD. She has not had any shortness of breath.  -- Maintain Advair 1 puff daily  -- Maintain duoneb q6h    9. Hypothyroidism: Patient has a history of hypothyroidism. Her TSH on admission was 1.536.  -- Maintain Synthroid po daily   10. Hyponatremia:  Na continuing to downtrend. Patient currently asymptomatic.  No evidence of hypovolemia, and patient has been tolerating a regular diet. TSH normal.  No hypernatremia or hyper triglyceridemia. Likely due to increased free water intake over the last few days when patient re-started tolerating PO. -discussed with Dr. Arlean Hopping, hyponatremia in ESRD patients can be corrected with  dialysis.  No need for acute treatments (hypertonic saline, salt tablets, etc.), will re-check this pm, and correct with dialysis MWF.  Dispo: Likely SNF Monday.    LOS: 10 days   Janalyn Harder 02/27/2011, 8:44 AM  Internal Medicine Teaching Service Attending Note Date: 02/27/2011  Patient name: [name]  Medical record number: [id number]  Date of birth: [birth year]    This patient has been seen and discussed with the house staff. Please see their note for complete details. I concur with their findings with the following additions/corrections: Ms. Mansouri's mental status is much improved since last week. She denies any shortness of breath or chest pressure when ambulating yesterday. Her presentation of altered mental status is resolved, but she likely does have element of dementia. During the hospitalization she had an episode of stress-induced cardiomyopathy for which she is to continue on aspirin, and optimize her hypertension medications. She is allergic to ace-inhibitors thus it will be excluded  from her regimen. Still not at goal with blood pressure, thus hydralazine is added back into her regimen. She is to have repeat echo in 4 wks to assess her EF that was decreased on this admission. She is to get MRI of kidney today to assess renal mass, then would be ready to discharge to snf for rehabilitation.  Judyann Munson 02/27/2011, 12:51 PM

## 2011-02-27 NOTE — Progress Notes (Signed)
Clinical Social Work-CSW facilitated pt d/c to [facility] SNF with d/c packet, family completing paperwork and [facility]AR transport-No further needs-Jeffory Snelgrove-MSW, [phone]

## 2011-02-27 NOTE — Progress Notes (Signed)
[facility] Progress Note Subjective:  Sleepy on dialysis.  Denies pain, SOB or CP  Objective on HD BP 150/63 P 61 - wt 70.2  Goal 5000- wt today up 10 Kg from 2/22?? Possible but not likely Filed Vitals:   02/27/11 0700 02/27/11 0735 02/27/11 0740 02/27/11 0800  BP: 174/74 184/74 166/68 150/63  Pulse: 55 57 57 61  Temp: 97 F (36.1 C)     TempSrc: Oral     Resp: 18 18 18 18   Height:      Weight: 70.2 kg (154 lb 12.2 oz)     SpO2: 98%      Physical Exam: General: Drowsy, breathing easily off O2  Heart: RRR  Lungs: clear inspiration, some sonorus expiratory breath sounds Abdomen: soft, nontender Extremities: no LE edema Dialysis Access: left upper AVGG Qb400  Problem/Plan: 1. AMS- old strokes by CT, prob TME. May have some early underlying dementia per family. Seen by psychiatry who suggests minimize ativan and begin Trazodone (which has NOT been started. Appears basely, but very drowsy 2. ESRD - MWF [facility] 22 Feb, CXR shows NO PNA (indicating that CXR findings were most likely fluid overload). Post HD weight 22 Feb 60.6 kg. Ca remains high despite 2.0 Ca bath  3. Hypertension- amlodipine 10/d, carvedilol 25 BID, Hydralazine d/c'd. If BP does not come down on dialysis would start benazepril--10 mg/d at HS today  4. Fever/pulm infiltrates-ceftin d/c.CXR 22 Feb--resolution of pulm edema, minimal L lung atelectasis. OK with me to d/c antibiotics  5. Metabolic bone disease - off zemplar and on non-Ca phosphate binders , Ca still HIGH. Using low Ca bath in HD plus non-Ca phos binder. Labs today pending; will follow up at discharge. 6. Hx of renal cell Ca, s/p L nephrectomy--MRI ordered, but not done since she couldn't hold still. Per Dr. Caryn Section: discussed CT (with Dr. Si Gaul in radiology today)--CT scan done at Dr. 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 GU should be called to see whether R nephrectomy  is indicated  7. HO CVA 2003  8. Hx CAD/MI stent 2007--cardiac cathThurs as noted  9. Hx of HIT  Additional Objective Labs: Basic Metabolic Panel:  Lab 02/26/11 1610 02/24/11 1307 02/22/11 1815 02/22/11 0809  NA 129* 129* 131* --  K 4.7 5.0 3.8 --  CL 90* 88* 93* --  CO2 26 26 27  --  GLUCOSE 97 86 128* --  BUN 41* 49* 16 --  CREATININE 7.06* 8.33* 4.48* --  CALCIUM 11.6* 11.4* 11.0* --  ALB -- -- -- --  PHOS 3.8 5.9* -- 6.7*   Liver Function Tests:  Lab 02/26/11 0620 02/24/11 1307 02/22/11 1815 02/22/11 0809  AST -- -- 27 25  ALT -- -- 20 17  ALKPHOS -- -- 59 61  BILITOT -- -- 0.4 0.4  PROT -- -- 6.5 6.6  ALBUMIN 2.8* 2.7* 2.7* --  CBC:  Lab 02/24/11 1307 02/22/11 1815 02/22/11 0809  WBC 4.4 4.7 5.1  NEUTROABS -- -- --  HGB 12.3 13.1 12.8  HCT 39.7 42.0 40.7  MCV 80.4 80.6 80.3  PLT 131* 80* 97*   Cardiac Enzymes:  Lab 02/23/11 1035 02/23/11 0244 02/22/11 1923 02/22/11 1815  CKTOTAL 35 52 95 83  CKMB 4.9* 5.6* 6.7* 6.3*  CKMBINDEX -- -- -- --  TROPONINI <0.30 <0.30 0.33* <0.30   CMedications:      .  amLODipine  10 mg Oral Daily  . antiseptic oral rinse  15 mL Mouth Rinse BID  . aspirin  81 mg Oral Daily  . carvedilol  25 mg Oral BID WC  . feeding supplement (NEPRO CARB STEADY)  237 mL Oral TID BM  . Fluticasone-Salmeterol  1 puff Inhalation Q12H  . levothyroxine  88 mcg Oral QAC breakfast  . pantoprazole  40 mg Oral Q1200  . sevelamer  1,600 mg Oral TID WC  . simvastatin  20 mg Oral q1800  . sodium chloride  3 mL Intravenous Q12H    I  have reviewed scheduled and prn medications.  Sheffield Slider, PA-C [facility] Beeper [phone]  02/27/2011,8:30 AM  LOS: 10 days   Patient seen and examined and agree with assessment and plan as above.   Vinson Moselle  MD [employer] [phone] pgr    [phone] cell 02/27/2011, 9:55 AM

## 2011-02-27 NOTE — Discharge Instructions (Signed)
End Stage Kidney Disease End-stage kidney disease occurs when your kidneys no longer work well enough to support day-to-day life. It usually occurs when longstanding (chronic) kidney failure gets worse, to the point where kidney function is less than 10% of normal. At this point, the kidney function is so low that death will occur from buildup of fluids and waste products in the body. The most common cause of kidney failure is diabetes. Kidney failure is very common. ESRD (End Stage Renal Disease) almost always follows chronic kidney failure or renal insufficiency. This condition may exist for 10 to 20 years or more before developing into ESRD. SYMPTOMS   Unintentional weight loss.   Fatigue, anemia.   Generalized itching (pruritus).   Easy bruising or bleeding.   Drowsiness, lethargy.   Muscle twitching or cramps.   Skin may appear yellow or [place].   General ill feeling.   Frequent hiccups.   No or decreased urine output.   Decreased alertness.   Coma.   Increased skin pigmentation.   Decreased sensation in the hands, feet, or other areas.  DIAGNOSIS  Your caregiver will be able to tell what is wrong by talking to you, doing an examination, and doing laboratory tests. The blood work and urinalysis will show that your kidneys are not working well enough. TREATMENT  Dialysis or kidney transplantation are the only treatments for ESRD. Your health, age and other factors determine which treatment is best. Other treatments for chronic renal failure should continue. Associated diseases that cause kidney failure should be controlled. Some of these are:  Hypertension.   Kidney stones.   Heart failure.   Obstructions of the urinary tract.   Urinary tract infections.   Glomerulonephritis.  PROGNOSIS  ESRD is fatal unless treated with dialysis or transplantation. Both of these treatments can have serious risks and consequences. The outcome varies and is unique to each  individual. RISKS AND COMPLICATIONS Complications of dialysis and kidney transplantation:  Hypertension (kidneys try to raise blood pressure so they can work better).   Platelet dysfunction (cells in blood which help with clotting are defective).   Gastrointestinal loss of blood, duodenal or peptic ulcers.   Hemorrhage (bleeding problems).   Anemia (not enough blood cells are produced).   Hepatitis B, hepatitis C, liver failure (exposure may occur during dialysis).   Infection from decreased operation of white blood cells and immune system.   Multiple cancers form, from long-term immunosuppressant use.   Peripheral neuropathy (damage to your nerves).   Seizures (convulsions).   Encephalopathy, nervous system damage, dementia (changes in your brain).   Weakening of the bones, fractures, joint disorders, joint replacements are common.   Permanent skin pigmentation changes.   Skin dryness, itching, scratching resulting in skin infection from hydration problems.   Changes in glucose metabolism.   Changes in electrolyte levels (salts in your blood).   Decreased libido, impotence (loss of interest in sex or ability to function well).   Miscarriage, menstrual irregularities, infertility.   Pericarditis (inflammation of the lining surrounding the heart).   Cardiac tamponade (fluid collection around the heart).   Heart failure in which your heart cannot pump well enough to keep up with the work.  PREVENTION  Treatment of the causes of longstanding kidney failure may delay or prevent progression to ESRD. For example, diabetes which is under strict control is less likely to cause renal failure than diabetes which is left untreated. Some causes of renal failure cannot be treated. Document Released: 03/11/2003 Document   Revised: 08/31/2010 Document Reviewed: 12/19/2004 [employer] Patient Information 2012 [employer]. 

## 2011-02-27 NOTE — Discharge Summary (Signed)
Internal Medicine Teaching [facility] Discharge Note  Name: [name] MRN: [id number] DOB: [birth year] 66 y.o.  Date of Admission: 02/17/2011  7:35 AM Date of Discharge: 02/27/2011 Attending Physician: Judyann Munson, MD  Discharge Diagnosis: 1. Altered Mental Status 2. Stress induced cardiomyopathy 3. Healthcare associated pneumonia 4. Hypertension 5. End-stage renal disease 6. 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 Coronary artery disease 8. COPD 9. Hypothyroidism 10. Hyponatremia  Discharge Medications: Medication List  As of 02/27/2011  1:05 PM   STOP taking these medications         acyclovir 800 MG tablet      HYDROcodone-acetaminophen 5-500 MG per tablet      LORazepam 2 MG tablet      oxyCODONE-acetaminophen 5-325 MG per tablet      promethazine-codeine 6.25-10 MG/5ML syrup         TAKE these medications         albuterol-ipratropium 18-103 MCG/ACT inhaler   Commonly known as: COMBIVENT   Inhale 2 puffs into the lungs every 6 (six) hours as needed. For breathing      amLODipine 10 MG tablet   Commonly known as: NORVASC   Take 10 mg by mouth daily.      aspirin EC 81 MG tablet   Take 81 mg by mouth daily.      carvedilol 25 MG tablet   Commonly known as: COREG   Take 25 mg by mouth 2 (two) times daily with a meal.      citalopram 20 MG tablet   Commonly known as: CELEXA   Take 20 mg by mouth daily.      DEXILANT 60 MG capsule   Generic drug: dexlansoprazole   Take 60 mg by mouth daily.      Fluticasone-Salmeterol 100-50 MCG/DOSE Aepb   Commonly known as: ADVAIR   Inhale 1 puff into the lungs every 12 (twelve) hours.      hydrALAZINE 25 MG tablet   Commonly known as: APRESOLINE   Take 25 mg by mouth 2 (two) times daily.      levothyroxine 88 MCG tablet   Commonly known as: SYNTHROID, LEVOTHROID   Take 88 mcg by mouth daily.      multivitamin Tabs tablet   Take 1 tablet by mouth daily.     NITROSTAT 0.4 MG SL tablet   Generic drug: nitroGLYCERIN   Place 0.4 mg under the tongue every 5 (five) minutes as needed. For chest pain.      polyethylene glycol powder powder   Commonly known as: GLYCOLAX/MIRALAX   Take 17 g by mouth daily as needed. For constipation.      pravastatin 40 MG tablet   Commonly known as: PRAVACHOL   Take 80 mg by mouth at bedtime.      sevelamer 800 MG tablet   Commonly known as: RENVELA   Take 1,600 mg by mouth 3 (three) times daily with meals.            Disposition and follow-up:   The patient was discharged from [facility] in stable and improved condition, with resolution of altered mental status.  The patient will follow-up with Dr. Allyne Gee on 3/5 at 3:30 pm.  The patient will also follow-up with Dr. Vernie Ammons on 3/5 at 8:15 am, to further follow-up on R renal mass.  Follow-up Appointments:  Follow-up Information    Follow up with [name], MD on 03/07/2011. (3:30 pm)  Contact information:   [address] [place] [address] [phone]       Follow up with Garnett Farm, MD on 03/07/2011. (8:15 am)    Contact information:   [address] [place] [address] [phone]           Consultations: Treatment Team:  Lauris Poag, MD  Procedures Performed:  Dg Chest 1 View  02/17/2011  *RADIOLOGY REPORT*  Clinical Data: Altered mental status  CHEST - 1 VIEW  Comparison: Exam at 1533 hours compared to 02/17/2011 at 1010 hours  Findings: Rotated to the left. Enlargement of cardiac silhouette with pulmonary vascular congestion. Bibasilar consolidation. Atherosclerotic calcification aortic arch. No gross pleural effusion or pneumothorax. Bones demineralized.  IMPRESSION: Enlargement of cardiac silhouette with pulmonary vascular congestion. Bibasilar consolidation.  Original Report Authenticated By: Lollie Marrow, M.D.   Dg Chest 2 View  02/24/2011  *RADIOLOGY REPORT*   Clinical Data: Follow up CHF post dialysis.  CHEST - 2 VIEW 02/24/2011:  Comparison: Portable chest x-rays yesterday and dating back to 02/19/2011.  Findings: Cardiac silhouette enlarged but stable.  Pulmonary vascularity now normal without evidence of pulmonary edema.  No visible pleural effusions.  Minimal linear atelectasis the left lung base.  Lungs otherwise clear.  IMPRESSION: Stable cardiomegaly.  Resolution of pulmonary edema.  Minimal atelectasis at the left lung base.  No acute cardiopulmonary disease otherwise.  Original Report Authenticated By: Arnell Sieving, M.D.   Ct Head Wo Contrast  02/18/2011  *RADIOLOGY REPORT*  Clinical Data: Altered mental status.  CT HEAD WITHOUT CONTRAST  Technique:  Contiguous axial images were obtained from the base of the skull through the vertex without contrast.  Comparison: 10/10/2009  Findings: There is stable low density and encephalomalacia involving the left frontal lobe and left parietal lobe. Again noted are low density lacunar infarcts throughout the cerebellum.  The study is limited due to excessive patient motion despite repeat images.  No evidence for acute hemorrhage, midline shift or hydrocephalus.  No evidence for a new large infarct.  No acute bony abnormality.  IMPRESSION: Limited examination due to excessive patient motion.  No significant change from the prior examination.  Old ischemic changes in the left cerebral cortex and cerebellar hemispheres.  Original Report Authenticated By: Richarda Overlie, M.D.   Ct Chest Wo Contrast  02/18/2011  *RADIOLOGY REPORT*  Clinical Data: Evaluate progressive pneumonia.  History of coronary artery disease, CVA.  Renal failure.  Hypertension.  End-stage renal disease.  CT CHEST WITHOUT CONTRAST  Technique:  Multidetector CT imaging of the chest was performed following the standard protocol without IV contrast.  Comparison: Plain films, including earlier in the day.  No prior CT.  Findings: Lung windows demonstrate  patent airways.  Patchy airspace disease within the dependent left upper lobe. Similar findings within the dependent left lower lobe.  No well-defined mass.  Soft tissue windows demonstrate  mild degradation secondary to lack of IV contrast and patient arm position.  Collateral vessels about the right chest suggests central venous insufficiency.  Moderate cardiomegaly with a small pericardial effusion.  Age advanced dense coronary artery atherosclerosis.  Trace left pleural fluid.  Enlarged pulmonary outflow tract, 3.2 cm.  Mitral annular calcifications.  Prevascular nodes measure up to 9 mm, image 16.  Limited evaluation for hilar adenopathy, secondary to unenhanced technique.  No middle mediastinal adenopathy.  Limited abdominal imaging demonstrates under distended gastric body.  Apparent wall thickening could be secondary.  Mild right  adrenal thickening.  Right renal atrophy with low density indeterminate lesions.  Left kidney absent. No acute osseous abnormality. Renal osteodystrophy.  IMPRESSION: 1.  Patchy dependent left upper and left lower lobe airspace disease.  Favor a component of infection.  When comparing scout film to the plain film of earlier today, is felt to be improved. 2.  Trace left pleural fluid. 3.  Cardiomegaly with small pericardial effusion and dense advanced coronary artery atherosclerosis. 4. Pulmonary artery enlargement suggests pulmonary arterial hypertension. 5.  Prominent but not pathologically enlarged prevascular nodes, likely reactive. 6.  Degraded exam due to lack of IV contrast and patient arm position. 7.  Atrophic right kidney with right renal lesions which were better evaluated on 01/26/2011. 8.  Apparent gastric wall thickening, possibly due to underdistension. Correlate with any symptoms of gastritis.  Original Report Authenticated By: Consuello Bossier, M.D.   Mr Brain Wo Contrast  02/20/2011  *RADIOLOGY REPORT*  Clinical Data: Altered mental status.  Dialysis patient.  MRI  HEAD WITHOUT CONTRAST  Technique:  Multiplanar, multiecho pulse sequences of the brain and surrounding structures were obtained according to standard protocol without intravenous contrast.  Comparison: Head CT 02/15  Findings: There is considerable motion degradation.  Diffusion imaging does not show any acute or subacute infarction. There are numerous old small vessel infarctions within the cerebellum.  There are old small vessel infarctions within the basal ganglia and thalami.  There are old cortical and subcortical infarctions in the left posterior frontal and parietal region.  No sign of mass lesion, hemorrhage, hydrocephalus or extra-axial collection.  No evidence of inflammatory sinus disease.  IMPRESSION: No evidence of acute or reversible process.  Old infarctions throughout the brain as outlined above.  Original Report Authenticated By: Thomasenia Sales, M.D.   Dg Chest Port 1 View  02/19/2011  *RADIOLOGY REPORT*  Clinical Data: Central line placement.  PORTABLE CHEST - 1 VIEW  Comparison: 02/18/2011 CT and plain film.  Findings: Left IJ central line terminates high, at the left jugular vein versus peripheral left brachiocephalic.  No pneumothorax.  Midline trachea.  Cardiomegaly accentuated by AP portable technique.  Probable small left pleural effusion.  Mild interstitial edema is new or increased.  Improved left upper lobe aeration with persistent lower lobe airspace disease.  IMPRESSION:  1.  Left IJ central line high in position.  This should be advanced at least 8 cm.  The study was made a "call report. 2. No pneumothorax. 3.  Cardiomegaly with developing interstitial edema. 4.  Improved aeration with persistent left base air space disease.  Original Report Authenticated By: Consuello Bossier, M.D.   Dg Chest Portable 1 View  02/17/2011  *RADIOLOGY REPORT*  Clinical Data: Headaches, confusion  PORTABLE CHEST - 1 VIEW  Comparison: Chest x-ray of 09/22/2010  Findings: There is vague opacity at both  lung bases suspicious for pneumonia.  Cardiomegaly is stable.  No effusion is seen.  No acute bony abnormality is noted.  IMPRESSION: Vague opacities at the lung bases medially.  Pneumonia cannot be excluded.  Stable cardiomegaly.  Original Report Authenticated By: Juline Patch, M.D.   Ir Radiologist Eval & Mgmt  02/10/2011  *RADIOLOGY REPORT*  NEW PATIENT OFFICE VISIT - LEVEL III ([id number])  02/07/11  Ihor Gully, M.D. [facility] [address] [place], [place]  [address]  RE:  Jing Howatt (DOB: [birth year])  Dear Loraine Leriche:  Thank you for sending Mrs. Jeralene Peters for consultation regarding possible use of  percutaneous ablation to treat an enhancing lesion of the right kidney.  The patient is a 66 year old female with a history of renal carcinoma of the left kidney presenting with flank pain in August 2011.  CT at that time showed a large 6 cm mass of the left kidney.  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 Resected lymph nodes were negative for metastatic disease.  The patient does have a history of end stage renal disease and has been dialysis dependent for the last six years.  Imaging after nephrectomy showed no evidence of tumor recurrence at the site of left nephrectomy.  CT of the abdomen with and without contrast on 01/26/11 showed some complex cysts of the remaining right kidney, marked right renal atrophy and a small area of nodular enhancement in the lower pole of the right kidney measuring roughly 9  mm in greatest diameter.  This enhancement is apparent only on the arterial phase of imaging and is not visible on the venous phase images.  This does not appear to be a hyperdense cyst, as it is not visible on the unenhanced CT images and was not previously seen by unenhanced CT.  The latest CT is the only CT performed with contrast since  nephrectomy.  The patient is asymptomatic with respect to the lesion and denies any hematuria or dysuria.  She has no right flank pain.  Past Medical History:     1.    History of end stage renal disease.  The patient has had a       prior failed graft in the right arm and is currently dialyzed by a       left upper arm HeRO graft placed in January by Dr. Wyn Quaker in       [place].  She did have some complications related to what       appears to be steal phenomenon and arterial insufficiency after       graft placement requiring additional vascular surgical procedures.       Details of exact nature of these procedures are currently       unavailable.  The patient states that she required some type of       venous bypass graft in the upper extremity which has helped with       steal symptoms.  Her left distal upper extremity remains weak.  The       patient dialyzes locally and is followed by  [facility]       [facility].  The patient has been evaluated at [facility] for possible renal       transplantation.  She currently is not on the transplant list due       to recent history of renal carcinoma. 2.    History of coronary artery disease.  Previous stenting of the right coronary artery.  History of diastolic heart failure.  She is currently asymptomatic with respect to chronic obstructive disease. She is followed by Dr. Angelina Sheriff with [facility]. 3.    History of hypertension and hypercholesterolemia.  The patient's primary care physician is Dr. Dorothyann Peng. 4.    History of stroke without significant neurologic deficit. The patient has seen Dr. Lesia Sago in the past. 5.    History of hypothyroidism. 6.    History of COPD. 7.    History of prior cholecystectomy. 8.  History of abdominal hysterectomy.  Medications:  Acyclovir 800 mg daily, Norvasc 10 mg daily, Coreg 25 mg two times daily, Dexilant 60 mg daily, Hydralazine 25 mg twice daily, Synthroid 88 mcg daily, Ativan 2 mg as needed, nitroglycerin 0.4 mg  sublingual as needed, Pravachol 80 mg daily, Renvela 800 mg three times daily, CeleXA 20 mg daily, hydrocodone - acetaminophen 5-500 mg as needed, aspirin 81 mg daily, Combivent inhaler as needed.  Allergies:  ACE inhibitors, heparin, penicillin, Demerol, morphine, Dilantin, pentazocine, iodinated contrast.  Family History:  Twin sister with history of renal carcinoma and renal failure.  Mother with history of hypertension.  Father with history of heart disease.  Social History:  The patient is married and has two children.  She lives in [place].  She is a retired Engineer, civil (consulting).  She rarely drinks alcohol.  She smokes approximately 1 pack of cigarettes per day. She consumes caffeine.  Review of Systems:  The patient wears glasses.  Gastrointestinal: No abdominal pain, nausea, vomiting or diarrhea.  Genitourinary: The patient still produces urine.  No hematuria or history of urinary infection.  Cardiovascular:  No chest pain or palpitations. Musculoskeletal:  No significant joint or muscle pain.  Neurologic: No focal neurologic symptoms.  Respiratory:  History of COPD.  No significant shortness of breath or cough.  Exam:  Vital Signs:  Blood pressure 201/91, pulse 69, respirations 18, temperature 98.2, oxygen saturation 100 percent on room air. Chest:  Clear to auscultation bilaterally.  Heart:  Regular rate and rhythm.  Abdomen:  Soft and nontender.  No flank tenderness. Extremities:  No edema.  Labs:  WBC 7.1, hemoglobin 12.1, hematocrit 36.6, platelet count 177.  Imaging:  All prior CT scans were independently reviewed.  Assessment:  I met with Mrs. Zahniser and her daughter, [name].  We reviewed imaging findings and discussed treatment options for the small area of what appears to be enhancing nodularity in the inferior aspect of the remaining right kidney.  This may have been present previously but was not evaluated with a contrast enhanced scan until recently.  Given the small size of this nodularity, I told Mrs. Whitmyer  that we can likely follow the lesion for now to determine growth pattern over time prior to making a treatment decision.  I did present the possibility of future percutaneous cryoablation and discussed details of the procedure including risks.  If ultimately treated, this small mass should be technically easy to treat with a percutaneous procedure.  Prior to surveillance, I have recommended at least one MRI scan to further characterize the right kidney including the enhancing lesion and the complex cysts present.  This will be potentially also helpful for follow-up purposes as the lesion may be better characterized by MRI than CT.  Unfortunately, the patient cannot receive gadolinium due to renal failure.  She did tolerate iodinated contrast administration recently with pre-medication prior to contrast administration due to history of prior allergy years ago.  Mrs. Brabec is agreeable to obtaining an MRI but did request a sedative given prior symptoms of claustrophobia with MRI.  We will give her 0.5 mg of Xanax for the scan.  This will be scheduled as an outpatient.  I will meet back with Mrs. Groome and her daughter after the MRI scan to discuss results.  Thank you again for sending Mrs. Waddington for consultation and allowing me to participate in her care.  I will forward MRI results to you.  I will also forward any future notes regarding surveillance  of the lesion and possible future percutaneous cryoablation should the lesion clearly enlarge over time.  Sincerely,  Irish Lack, M.D. Lavone NianAngelina Sheriff, M.D.       Annie Sable, M.D.       Dorothyann Peng, M.D.  Original Report Authenticated By: Reola Calkins, M.D.   Dg Fluoro Guide Lumbar Puncture  02/21/2011  *RADIOLOGY REPORT*  Clinical Data:  Altered mental status.  DIAGNOSTIC LUMBAR PUNCTURE UNDER FLUOROSCOPIC GUIDANCE  Fluoroscopy time:  0.16 minutes minutes.  Technique:  Informed consent was obtained from the patient prior to the  procedure, including potential complications of headache, allergy, and pain.   With the patient prone, the lower back was prepped with Betadine.  1% Lidocaine was used for local anesthesia. Lumbar puncture was performed at the left paramidline L2-3 level using a 22 gauge needle with return of clear CSF with an opening pressure of 16 cm water, within normal limits.   11.0 ml of CSF were obtained for laboratory studies.  The patient tolerated the procedure well and there were no apparent complications.  IMPRESSION: Technically successful fluoroscopic guided lumbar puncture via a left paramidline approach at L2-3.  Original Report Authenticated By: Jamesetta Orleans. MATTERN, M.D.   CT Abd/Pelvis 01/26/11 IMPRESSION: 9 mm enhancing focus in the anterior right lower pole, possibly reflecting a small renal neoplasm, Bosniak III. 1.9 cm complex cystic lesion in the right upper pole, Bosniak IIF. If no intervention is performed, follow-up imaging CT with/without contrast is suggested in 6 months.  Additional hemorrhagic and nonhemorrhagic right renal cysts, as described above.  Status post left nephrectomy.   Cardiac Cath: 02/22/11 Procedural Findings:  Hemodynamics:  AO: 132/57 mmHg  LV: 127/1 mmHg  LVEDP: 4 mmHg  Coronary angiography:  Coronary dominance: Right  Left Main: Normal in size with mild 10-20% distal stenosis.  Left Anterior Descending (LAD): Mildly calcified with mild diffuse atherosclerosis but no evidence of obstructive disease.  1st diagonal (D1): Normal in size with 30% proximal disease.  2nd diagonal (D2): Small in size.  3rd diagonal (D3): Small in size.  Circumflex (LCx): Normal in size and mildly calcified. There is mild diffuse atherosclerosis but no evidence of obstructive disease.  1st obtuse marginal: Very small in size.  2nd obtuse marginal: Medium in size with minor irregularities.  3rd obtuse marginal: Normal in size and free of significant disease.  Right Coronary  Artery: The vessel is normal in size and appears to be dominant. An ostial stent is noted which appears to be protruding into the aorta at least a few millimeters. This made engaging the vessel selectively almost impossible. I was able to get nonselective shots with an AR-1 catheter. This showed that the vessel is patent with no significant in-stent restenosis. There seems to be a 30- 40% stenosis distal to the stent but otherwise no evidence of obstructive disease.  Left ventriculography: Left ventricular systolic function is moderately reduced , LVEF is estimated at 35-40 %. There is akinesis of the distal anterior, apex and distal inferior wall. The appearance is suggestive of stress-induced cardiomyopathy.  Final Conclusions:  1. Patent RCA stent with no evidence of obstructive coronary artery disease.  2. Moderately reduced LV systolic function with an estimated ejection fraction 35-40% with an appearance suggestive of stress-induced cardiomyopathy.  3. Low left ventricular end-diastolic pressure.  Recommendations: The patient does not have evidence of obstructive coronary artery disease to explain her ECG changes. Her ECG changes and left ventricular angiography are suggestive of  stress-induced cardiomyopathy. However, given her continued mental status changes, a CT scan of the head is recommended to rule out intracranial bleed as a cause of her current presentation. Continue treatment with Coreg and consider an echocardiogram in few days.    Admission HPI:  Mrs. Mitchner is a 66 year old lady with a PMH significant for CVA (2003), CAD, ESRD on mwf dialysis, PVD, Hypothyroidism, lyperlipidemia, hypertension who presents with acute mental status change. All information was gathered by the patient's daugher ([name]). Last night Mrs. Vandenheuvel was at her baseline mental status, which means she is mildly forgetful but able to perform executive functions. This morning she was found disheveled by a neighbor, when  she was scheduled to attend a dialysis appointment. The patient was incoherent with a short attention span. In the ED her blood pressure was 242103, which was corrected to 16862 after 10mg  iv of Hydralazine. She was also given 1mg  of Ativan for agitation.  The patient has a history of a stroke in 2003, which predominantly affected the left side of her body. The patient is left handed. The patient recovered from this stroke with little residual effects. The patient has a history of CAD status post stenting of her RCA "many years ago". The patient has not complained of an increase in chest pain or shortness of breath lately, though she does occasionally use her nitroglycerine tablets for angina. The patient does not have a history of syncope. Patient has a history of heart failure, and has been losing weight lately. The patient has a history of COPD, but she rarely has to use her Albuterol/Ipratropium inhaler. The patient has a long history of hypertension. At home, she regularly has SBPs of 160-170. The daughter believes the patient has taken lots of Ativan, Benadryl, and Percocet lately due to pain and discomfort with her left arm dialysis graft. Patient has not been ill lately, and does not have any sick contacts. The patient has had a mildly productive cough, but is without fever, chills, malaise, or shortness of breath. Patient has ESRD due to hypertension on MWF hemodialysis. She still produces urine, but has not complained of any dysuria. Patient has a history of falling out of her bed, but not blood or trauma were noted. Patient has complained of intermittent constipation over the past several weeks.   Admission Physical Exam Blood pressure 168/62, pulse 89, temperature 98.3 F (36.8 C), temperature source Oral, resp. rate 15, SpO2 94.00%.  Head: Normocephalic, Atraumatic.  Eyes: Pupils equally round and reactive to light bilaterally. Sclera non-injected and anicteric.  Mouth: Normal dentition. Moist  mucus membranes.  Neck: At 45 degrees JVD only observed with hepatic palpation. Trachea midline. No cervical lymphadenopathy. No thyroidmegaly.  Pulmonary: Normal work of breathing. Breath sounds present bilaterally. Mild rhonchii on left. No wheezing or crackles.  Cardiac: Regular rate and rhythm. No murmurs, rubs, or gallops. Normal S1 and S2.  GI: Bowel sounds present. Non-tender to palpation. No hepatosplenomegaly. Well healed surgical scars.  Extremities: 1+ left radial pulse. 2+ right radial pulse. 2+ pedal pulses bilaterally. Left arm is cool to touch. No clubbing.  Neuro: Minimal participation to exam. Ignores everything to her left. 5/5 grip on right. 3/5 grip on left. 5/5 ankle extension on right. 3/5 ankle extension on left. Reacts to painful stimuli bilaterally. Toes down and in R>L. 2+ patellar reflexes bilaterally.  Psych: Can give full name. Does not know time, date, location, president, or reason for hospitalization.  Admission Labs Na 137, K  4.6, Cl 97, CO2 25, BUN 27, Cr 7.42, Glucose 119, Ca 10.9, Mg 2.0, Phosphorus 5.4  Alk Phos 78, Albumin 3.3, AST 15, ALT 8, Total Protein 6.8, Tbili 0.5  WBC 9.3, HGB 12.9, HCT 42.3, PLTs 113,  INR 1.00  Hospital Course by problem list: 1. Altered Mental Status - the patient presented with acute altered mental status, unable to follow commands or answer questions.  Initial lab work-up revealed no significant abnormalities.  Imaging revealed a possible lung opacity on chest x-ray concerning for pneumonia, a head CT with no acute abnormalities, a chest CT showing a right renal lesion (previously seen on CT 01/26/11), and a brain MRI with no acute abnormality.  A lumbar puncture showed no significant abnormality.  There was concern for medication-induced AMS, and the patient's medication list was reduced, with significant improvement in mental status.  By discharge, the patient had improved to her baseline mental status, and was alert and oriented  x3, and able to participate in activities of daily living.   2. Stress-induced cardiomyopathy - during admission, the patient was noted to become tachycardic and short of breath while working with PT.  An EKG was obtained, which revealed ST-segment elevations.  The patient was taken to the cath lab out of concern for STEMI, but the cath revealed no significant vessel blockage.  The patient was diagnosed with Takotsubo cardiomyopathy.  The patient participated with PT with no further incident for the remainder of her hospitalization, and will be discharged to SNF for further rehabilitation.  3. Healthcare associated pneumonia - the patient presented with altered mental status, and a possible lung opacity on chest x-ray.  However, the patient had no preceding cough, and WBC count = 9.3.  The patient had a Tmax of 101.7 on Day 1 of hospitalization, which resolved by day 2, and did not recur over the remainder of her hospitalization.   She was started on vanc and cefepime, then transitioned to cefuroxime, for a total of 7 days of antibiotics, from 2/15-2/22.   4. Hypertension - the patient has a history of hypertension, which was labile but relatively well-controlled over the course of her hospitalization.  The patient will be discharged on her home regimen of coreg, amlodipine, and hydralazine.  5. End-stage renal disease - the patient has a history of ESRD, and was managed on dialysis throughout hospitalization.  6. Right renal mass - The patient has a known history of a right kidney mass, noted on CT abd 01/26/11 as an anterior right lower pole Bosniac III lesion.  The patient has a history of left RCC, s/p left nephrectomy, and currently follows with Dr. Vernie Ammons of urology.  The patient will follow-up with Dr. Vernie Ammons on 3/5, for further work-up and management.  9. Hypothyroidism - the patient has a long-standing history of hypothyroidism, with TSH within normal limits on admission.  The patient was  continued on her synthroid.  10. Hyponatremia - the patient was noted to have mild hyponatremia during the last few days of her hospitalization.  Her hyponatremia is likely attributable to increased free water intake, after the patient's AMS resolved and she was able to tolerate oral intake.  Her hyponatremia was managed with dialysis, and will continue to be managed with dialysis.  Time spent on discharge: 45 minutes  Discharge Vitals:  BP 130/69  Pulse 70  Temp(Src) 97.4 F (36.3 C) (Oral)  Resp 18  Ht 5\' 2"  (1.575 m)  Wt 148 lb 13 oz (67.5 kg)  BMI 27.22 kg/m2  SpO2 100%  Discharge Labs:  Results for orders placed during the hospital encounter of 02/17/11 (from the past 24 hour(s))  RENAL FUNCTION PANEL     Status: Abnormal   Collection Time   02/27/11  8:00 AM      Component Value Range   Sodium 121 (*) 135 - 145 (mEq/L)   Potassium 5.2 (*) 3.5 - 5.1 (mEq/L)   Chloride 84 (*) 96 - 112 (mEq/L)   CO2 25  19 - 32 (mEq/L)   Glucose, Bld 78  70 - 99 (mg/dL)   BUN 49 (*) 6 - 23 (mg/dL)   Creatinine, Ser 7.82 (*) 0.50 - 1.10 (mg/dL)   Calcium 95.6 (*) 8.4 - 10.5 (mg/dL)   Phosphorus 4.0  2.3 - 4.6 (mg/dL)   Albumin 2.6 (*) 3.5 - 5.2 (g/dL)   GFR calc non Af Amer 4 (*) >90 (mL/min)   GFR calc Af Amer 5 (*) >90 (mL/min)  CBC     Status: Abnormal   Collection Time   02/27/11  8:00 AM      Component Value Range   WBC 4.8  4.0 - 10.5 (K/uL)   RBC 4.76  3.87 - 5.11 (MIL/uL)   Hemoglobin 11.9 (*) 12.0 - 15.0 (g/dL)   HCT 21.3  08.6 - 57.8 (%)   MCV 77.9 (*) 78.0 - 100.0 (fL)   MCH 25.0 (*) 26.0 - 34.0 (pg)   MCHC 32.1  30.0 - 36.0 (g/dL)   RDW 46.9 (*) 62.9 - 15.5 (%)   Platelets 134 (*) 150 - 400 (K/uL)    Signed: Janalyn Harder 02/27/2011, 1:05 PM

## 2011-03-01 ENCOUNTER — Other Ambulatory Visit: Payer: Self-pay | Admitting: Interventional Radiology

## 2011-03-01 DIAGNOSIS — N2889 Other specified disorders of kidney and ureter: Secondary | ICD-10-CM

## 2011-03-01 NOTE — Discharge Summary (Signed)
Internal Medicine Teaching Service Attending Note Date: 03/01/2011  Patient name: [name]  Medical record number: [id number]  Date of birth: 1945/09/06    This patient has been seen and discussed with the house staff. Please see their note for complete details. I concur with their findings and the discharge plan for [name] to go to SNF for further rehabilitation from her prolonged hospitalization.  Latrenda Irani

## 2011-03-02 DIAGNOSIS — N186 End stage renal disease: Secondary | ICD-10-CM | POA: Diagnosis not present

## 2011-03-03 DIAGNOSIS — D509 Iron deficiency anemia, unspecified: Secondary | ICD-10-CM | POA: Diagnosis not present

## 2011-03-03 DIAGNOSIS — D631 Anemia in chronic kidney disease: Secondary | ICD-10-CM | POA: Diagnosis not present

## 2011-03-03 DIAGNOSIS — N186 End stage renal disease: Secondary | ICD-10-CM | POA: Diagnosis not present

## 2011-03-03 DIAGNOSIS — N2581 Secondary hyperparathyroidism of renal origin: Secondary | ICD-10-CM | POA: Diagnosis not present

## 2011-03-07 ENCOUNTER — Ambulatory Visit
Admission: RE | Admit: 2011-03-07 | Discharge: 2011-03-07 | Disposition: A | Discharge status: Home / Self Care | Payer: Medicare Other | Source: Ambulatory Visit | Attending: Interventional Radiology | Admitting: Interventional Radiology

## 2011-03-07 VITALS — BP 146/66 | HR 57 | Temp 98.0°F | Resp 12

## 2011-03-07 DIAGNOSIS — R4182 Altered mental status, unspecified: Secondary | ICD-10-CM | POA: Diagnosis not present

## 2011-03-07 DIAGNOSIS — N2889 Other specified disorders of kidney and ureter: Secondary | ICD-10-CM

## 2011-03-07 DIAGNOSIS — N289 Disorder of kidney and ureter, unspecified: Secondary | ICD-10-CM | POA: Diagnosis not present

## 2011-03-07 DIAGNOSIS — Z79899 Other long term (current) drug therapy: Secondary | ICD-10-CM | POA: Diagnosis not present

## 2011-03-07 DIAGNOSIS — N189 Chronic kidney disease, unspecified: Secondary | ICD-10-CM | POA: Diagnosis not present

## 2011-03-07 DIAGNOSIS — I129 Hypertensive chronic kidney disease with stage 1 through stage 4 chronic kidney disease, or unspecified chronic kidney disease: Secondary | ICD-10-CM | POA: Diagnosis not present

## 2011-03-07 NOTE — Progress Notes (Signed)
Pt. To go over MRI results to f/u renal lesion.  Only complaint is weakness, no other symptoms to report.

## 2011-03-08 DIAGNOSIS — R4182 Altered mental status, unspecified: Secondary | ICD-10-CM | POA: Diagnosis not present

## 2011-03-08 DIAGNOSIS — I10 Essential (primary) hypertension: Secondary | ICD-10-CM | POA: Diagnosis not present

## 2011-03-08 DIAGNOSIS — R112 Nausea with vomiting, unspecified: Secondary | ICD-10-CM | POA: Diagnosis not present

## 2011-03-08 DIAGNOSIS — I129 Hypertensive chronic kidney disease with stage 1 through stage 4 chronic kidney disease, or unspecified chronic kidney disease: Secondary | ICD-10-CM | POA: Diagnosis not present

## 2011-03-08 DIAGNOSIS — B029 Zoster without complications: Secondary | ICD-10-CM | POA: Diagnosis not present

## 2011-03-08 DIAGNOSIS — R1084 Generalized abdominal pain: Secondary | ICD-10-CM | POA: Diagnosis not present

## 2011-03-08 DIAGNOSIS — Z79899 Other long term (current) drug therapy: Secondary | ICD-10-CM | POA: Diagnosis not present

## 2011-03-08 DIAGNOSIS — N189 Chronic kidney disease, unspecified: Secondary | ICD-10-CM | POA: Diagnosis not present

## 2011-03-09 DIAGNOSIS — Z8673 Personal history of transient ischemic attack (TIA), and cerebral infarction without residual deficits: Secondary | ICD-10-CM | POA: Diagnosis not present

## 2011-03-09 DIAGNOSIS — I739 Peripheral vascular disease, unspecified: Secondary | ICD-10-CM | POA: Diagnosis not present

## 2011-03-09 DIAGNOSIS — M6281 Muscle weakness (generalized): Secondary | ICD-10-CM | POA: Diagnosis not present

## 2011-03-09 DIAGNOSIS — Z85528 Personal history of other malignant neoplasm of kidney: Secondary | ICD-10-CM | POA: Diagnosis not present

## 2011-03-09 DIAGNOSIS — N186 End stage renal disease: Secondary | ICD-10-CM | POA: Diagnosis not present

## 2011-03-09 DIAGNOSIS — I5032 Chronic diastolic (congestive) heart failure: Secondary | ICD-10-CM | POA: Diagnosis not present

## 2011-03-09 DIAGNOSIS — J449 Chronic obstructive pulmonary disease, unspecified: Secondary | ICD-10-CM | POA: Diagnosis not present

## 2011-03-09 DIAGNOSIS — I120 Hypertensive chronic kidney disease with stage 5 chronic kidney disease or end stage renal disease: Secondary | ICD-10-CM | POA: Diagnosis not present

## 2011-03-09 DIAGNOSIS — I2510 Atherosclerotic heart disease of native coronary artery without angina pectoris: Secondary | ICD-10-CM | POA: Diagnosis not present

## 2011-03-09 DIAGNOSIS — Z992 Dependence on renal dialysis: Secondary | ICD-10-CM | POA: Diagnosis not present

## 2011-03-10 DIAGNOSIS — M6281 Muscle weakness (generalized): Secondary | ICD-10-CM | POA: Diagnosis not present

## 2011-03-10 DIAGNOSIS — I120 Hypertensive chronic kidney disease with stage 5 chronic kidney disease or end stage renal disease: Secondary | ICD-10-CM | POA: Diagnosis not present

## 2011-03-10 DIAGNOSIS — I739 Peripheral vascular disease, unspecified: Secondary | ICD-10-CM | POA: Diagnosis not present

## 2011-03-10 DIAGNOSIS — N186 End stage renal disease: Secondary | ICD-10-CM | POA: Diagnosis not present

## 2011-03-10 DIAGNOSIS — I5032 Chronic diastolic (congestive) heart failure: Secondary | ICD-10-CM | POA: Diagnosis not present

## 2011-03-10 DIAGNOSIS — I2510 Atherosclerotic heart disease of native coronary artery without angina pectoris: Secondary | ICD-10-CM | POA: Diagnosis not present

## 2011-03-14 DIAGNOSIS — I5032 Chronic diastolic (congestive) heart failure: Secondary | ICD-10-CM | POA: Diagnosis not present

## 2011-03-14 DIAGNOSIS — I739 Peripheral vascular disease, unspecified: Secondary | ICD-10-CM | POA: Diagnosis not present

## 2011-03-14 DIAGNOSIS — I120 Hypertensive chronic kidney disease with stage 5 chronic kidney disease or end stage renal disease: Secondary | ICD-10-CM | POA: Diagnosis not present

## 2011-03-14 DIAGNOSIS — I2510 Atherosclerotic heart disease of native coronary artery without angina pectoris: Secondary | ICD-10-CM | POA: Diagnosis not present

## 2011-03-14 DIAGNOSIS — M6281 Muscle weakness (generalized): Secondary | ICD-10-CM | POA: Diagnosis not present

## 2011-03-14 DIAGNOSIS — N186 End stage renal disease: Secondary | ICD-10-CM | POA: Diagnosis not present

## 2011-03-16 DIAGNOSIS — K117 Disturbances of salivary secretion: Secondary | ICD-10-CM | POA: Diagnosis not present

## 2011-03-16 DIAGNOSIS — F329 Major depressive disorder, single episode, unspecified: Secondary | ICD-10-CM | POA: Diagnosis not present

## 2011-03-16 DIAGNOSIS — A5901 Trichomonal vulvovaginitis: Secondary | ICD-10-CM | POA: Diagnosis not present

## 2011-03-16 DIAGNOSIS — F17200 Nicotine dependence, unspecified, uncomplicated: Secondary | ICD-10-CM | POA: Diagnosis not present

## 2011-03-17 DIAGNOSIS — I739 Peripheral vascular disease, unspecified: Secondary | ICD-10-CM | POA: Diagnosis not present

## 2011-03-17 DIAGNOSIS — M6281 Muscle weakness (generalized): Secondary | ICD-10-CM | POA: Diagnosis not present

## 2011-03-17 DIAGNOSIS — I2510 Atherosclerotic heart disease of native coronary artery without angina pectoris: Secondary | ICD-10-CM | POA: Diagnosis not present

## 2011-03-17 DIAGNOSIS — I120 Hypertensive chronic kidney disease with stage 5 chronic kidney disease or end stage renal disease: Secondary | ICD-10-CM | POA: Diagnosis not present

## 2011-03-17 DIAGNOSIS — I5032 Chronic diastolic (congestive) heart failure: Secondary | ICD-10-CM | POA: Diagnosis not present

## 2011-03-17 DIAGNOSIS — N186 End stage renal disease: Secondary | ICD-10-CM | POA: Diagnosis not present

## 2011-03-23 DIAGNOSIS — I2510 Atherosclerotic heart disease of native coronary artery without angina pectoris: Secondary | ICD-10-CM | POA: Diagnosis not present

## 2011-03-23 DIAGNOSIS — I120 Hypertensive chronic kidney disease with stage 5 chronic kidney disease or end stage renal disease: Secondary | ICD-10-CM | POA: Diagnosis not present

## 2011-03-23 DIAGNOSIS — I5032 Chronic diastolic (congestive) heart failure: Secondary | ICD-10-CM | POA: Diagnosis not present

## 2011-03-23 DIAGNOSIS — N186 End stage renal disease: Secondary | ICD-10-CM | POA: Diagnosis not present

## 2011-03-23 DIAGNOSIS — I739 Peripheral vascular disease, unspecified: Secondary | ICD-10-CM | POA: Diagnosis not present

## 2011-03-23 DIAGNOSIS — M6281 Muscle weakness (generalized): Secondary | ICD-10-CM | POA: Diagnosis not present

## 2011-03-24 LAB — FUNGUS CULTURE W SMEAR: Fungal Smear: NONE SEEN

## 2011-03-28 DIAGNOSIS — I739 Peripheral vascular disease, unspecified: Secondary | ICD-10-CM | POA: Diagnosis not present

## 2011-03-28 DIAGNOSIS — I2510 Atherosclerotic heart disease of native coronary artery without angina pectoris: Secondary | ICD-10-CM | POA: Diagnosis not present

## 2011-03-28 DIAGNOSIS — I5032 Chronic diastolic (congestive) heart failure: Secondary | ICD-10-CM | POA: Diagnosis not present

## 2011-03-28 DIAGNOSIS — M6281 Muscle weakness (generalized): Secondary | ICD-10-CM | POA: Diagnosis not present

## 2011-03-28 DIAGNOSIS — I120 Hypertensive chronic kidney disease with stage 5 chronic kidney disease or end stage renal disease: Secondary | ICD-10-CM | POA: Diagnosis not present

## 2011-03-28 DIAGNOSIS — N186 End stage renal disease: Secondary | ICD-10-CM | POA: Diagnosis not present

## 2011-04-02 DIAGNOSIS — N186 End stage renal disease: Secondary | ICD-10-CM | POA: Diagnosis not present

## 2011-04-03 DIAGNOSIS — N186 End stage renal disease: Secondary | ICD-10-CM | POA: Diagnosis not present

## 2011-04-03 DIAGNOSIS — N039 Chronic nephritic syndrome with unspecified morphologic changes: Secondary | ICD-10-CM | POA: Diagnosis not present

## 2011-04-03 DIAGNOSIS — D509 Iron deficiency anemia, unspecified: Secondary | ICD-10-CM | POA: Diagnosis not present

## 2011-04-03 DIAGNOSIS — N2581 Secondary hyperparathyroidism of renal origin: Secondary | ICD-10-CM | POA: Diagnosis not present

## 2011-04-05 DIAGNOSIS — N186 End stage renal disease: Secondary | ICD-10-CM | POA: Diagnosis not present

## 2011-04-05 DIAGNOSIS — N2581 Secondary hyperparathyroidism of renal origin: Secondary | ICD-10-CM | POA: Diagnosis not present

## 2011-04-05 DIAGNOSIS — N039 Chronic nephritic syndrome with unspecified morphologic changes: Secondary | ICD-10-CM | POA: Diagnosis not present

## 2011-04-05 DIAGNOSIS — D509 Iron deficiency anemia, unspecified: Secondary | ICD-10-CM | POA: Diagnosis not present

## 2011-04-07 DIAGNOSIS — D509 Iron deficiency anemia, unspecified: Secondary | ICD-10-CM | POA: Diagnosis not present

## 2011-04-07 DIAGNOSIS — N186 End stage renal disease: Secondary | ICD-10-CM | POA: Diagnosis not present

## 2011-04-07 DIAGNOSIS — N2581 Secondary hyperparathyroidism of renal origin: Secondary | ICD-10-CM | POA: Diagnosis not present

## 2011-04-07 DIAGNOSIS — D631 Anemia in chronic kidney disease: Secondary | ICD-10-CM | POA: Diagnosis not present

## 2011-04-10 DIAGNOSIS — D509 Iron deficiency anemia, unspecified: Secondary | ICD-10-CM | POA: Diagnosis not present

## 2011-04-10 DIAGNOSIS — N039 Chronic nephritic syndrome with unspecified morphologic changes: Secondary | ICD-10-CM | POA: Diagnosis not present

## 2011-04-10 DIAGNOSIS — N2581 Secondary hyperparathyroidism of renal origin: Secondary | ICD-10-CM | POA: Diagnosis not present

## 2011-04-10 DIAGNOSIS — N186 End stage renal disease: Secondary | ICD-10-CM | POA: Diagnosis not present

## 2011-04-12 DIAGNOSIS — N2581 Secondary hyperparathyroidism of renal origin: Secondary | ICD-10-CM | POA: Diagnosis not present

## 2011-04-12 DIAGNOSIS — N186 End stage renal disease: Secondary | ICD-10-CM | POA: Diagnosis not present

## 2011-04-12 DIAGNOSIS — N039 Chronic nephritic syndrome with unspecified morphologic changes: Secondary | ICD-10-CM | POA: Diagnosis not present

## 2011-04-12 DIAGNOSIS — D509 Iron deficiency anemia, unspecified: Secondary | ICD-10-CM | POA: Diagnosis not present

## 2011-04-14 DIAGNOSIS — N039 Chronic nephritic syndrome with unspecified morphologic changes: Secondary | ICD-10-CM | POA: Diagnosis not present

## 2011-04-14 DIAGNOSIS — N186 End stage renal disease: Secondary | ICD-10-CM | POA: Diagnosis not present

## 2011-04-14 DIAGNOSIS — N2581 Secondary hyperparathyroidism of renal origin: Secondary | ICD-10-CM | POA: Diagnosis not present

## 2011-04-14 DIAGNOSIS — D509 Iron deficiency anemia, unspecified: Secondary | ICD-10-CM | POA: Diagnosis not present

## 2011-04-17 DIAGNOSIS — N2581 Secondary hyperparathyroidism of renal origin: Secondary | ICD-10-CM | POA: Diagnosis not present

## 2011-04-17 DIAGNOSIS — N039 Chronic nephritic syndrome with unspecified morphologic changes: Secondary | ICD-10-CM | POA: Diagnosis not present

## 2011-04-17 DIAGNOSIS — D509 Iron deficiency anemia, unspecified: Secondary | ICD-10-CM | POA: Diagnosis not present

## 2011-04-17 DIAGNOSIS — N186 End stage renal disease: Secondary | ICD-10-CM | POA: Diagnosis not present

## 2011-04-19 DIAGNOSIS — N186 End stage renal disease: Secondary | ICD-10-CM | POA: Diagnosis not present

## 2011-04-19 DIAGNOSIS — N039 Chronic nephritic syndrome with unspecified morphologic changes: Secondary | ICD-10-CM | POA: Diagnosis not present

## 2011-04-19 DIAGNOSIS — D509 Iron deficiency anemia, unspecified: Secondary | ICD-10-CM | POA: Diagnosis not present

## 2011-04-19 DIAGNOSIS — N2581 Secondary hyperparathyroidism of renal origin: Secondary | ICD-10-CM | POA: Diagnosis not present

## 2011-04-22 DIAGNOSIS — N186 End stage renal disease: Secondary | ICD-10-CM | POA: Diagnosis not present

## 2011-04-22 DIAGNOSIS — N2581 Secondary hyperparathyroidism of renal origin: Secondary | ICD-10-CM | POA: Diagnosis not present

## 2011-04-22 DIAGNOSIS — N039 Chronic nephritic syndrome with unspecified morphologic changes: Secondary | ICD-10-CM | POA: Diagnosis not present

## 2011-04-22 DIAGNOSIS — D509 Iron deficiency anemia, unspecified: Secondary | ICD-10-CM | POA: Diagnosis not present

## 2011-04-24 DIAGNOSIS — D631 Anemia in chronic kidney disease: Secondary | ICD-10-CM | POA: Diagnosis not present

## 2011-04-24 DIAGNOSIS — N186 End stage renal disease: Secondary | ICD-10-CM | POA: Diagnosis not present

## 2011-04-24 DIAGNOSIS — N2581 Secondary hyperparathyroidism of renal origin: Secondary | ICD-10-CM | POA: Diagnosis not present

## 2011-04-24 DIAGNOSIS — D509 Iron deficiency anemia, unspecified: Secondary | ICD-10-CM | POA: Diagnosis not present

## 2011-04-26 DIAGNOSIS — E1129 Type 2 diabetes mellitus with other diabetic kidney complication: Secondary | ICD-10-CM | POA: Diagnosis not present

## 2011-04-26 DIAGNOSIS — D509 Iron deficiency anemia, unspecified: Secondary | ICD-10-CM | POA: Diagnosis not present

## 2011-04-26 DIAGNOSIS — N2581 Secondary hyperparathyroidism of renal origin: Secondary | ICD-10-CM | POA: Diagnosis not present

## 2011-04-26 DIAGNOSIS — D631 Anemia in chronic kidney disease: Secondary | ICD-10-CM | POA: Diagnosis not present

## 2011-04-26 DIAGNOSIS — N186 End stage renal disease: Secondary | ICD-10-CM | POA: Diagnosis not present

## 2011-04-29 ENCOUNTER — Emergency Department (HOSPITAL_COMMUNITY): Payer: Medicare Other

## 2011-04-29 ENCOUNTER — Encounter (HOSPITAL_COMMUNITY): Payer: Self-pay

## 2011-04-29 ENCOUNTER — Inpatient Hospital Stay (HOSPITAL_COMMUNITY)
Admission: EM | Admit: 2011-04-29 | Discharge: 2011-05-04 | DRG: 100 | Disposition: A | Discharge status: Rehabilitation Facility | Payer: Medicare Other | Attending: Family Medicine | Admitting: Family Medicine

## 2011-04-29 DIAGNOSIS — I319 Disease of pericardium, unspecified: Secondary | ICD-10-CM | POA: Diagnosis not present

## 2011-04-29 DIAGNOSIS — R569 Unspecified convulsions: Secondary | ICD-10-CM

## 2011-04-29 DIAGNOSIS — Z8673 Personal history of transient ischemic attack (TIA), and cerebral infarction without residual deficits: Secondary | ICD-10-CM | POA: Diagnosis not present

## 2011-04-29 DIAGNOSIS — E162 Hypoglycemia, unspecified: Secondary | ICD-10-CM | POA: Diagnosis not present

## 2011-04-29 DIAGNOSIS — R404 Transient alteration of awareness: Secondary | ICD-10-CM | POA: Diagnosis not present

## 2011-04-29 DIAGNOSIS — I120 Hypertensive chronic kidney disease with stage 5 chronic kidney disease or end stage renal disease: Secondary | ICD-10-CM | POA: Diagnosis present

## 2011-04-29 DIAGNOSIS — I2510 Atherosclerotic heart disease of native coronary artery without angina pectoris: Secondary | ICD-10-CM | POA: Diagnosis not present

## 2011-04-29 DIAGNOSIS — J449 Chronic obstructive pulmonary disease, unspecified: Secondary | ICD-10-CM | POA: Diagnosis present

## 2011-04-29 DIAGNOSIS — N2581 Secondary hyperparathyroidism of renal origin: Secondary | ICD-10-CM | POA: Diagnosis not present

## 2011-04-29 DIAGNOSIS — I428 Other cardiomyopathies: Secondary | ICD-10-CM | POA: Diagnosis present

## 2011-04-29 DIAGNOSIS — I469 Cardiac arrest, cause unspecified: Secondary | ICD-10-CM | POA: Diagnosis present

## 2011-04-29 DIAGNOSIS — R918 Other nonspecific abnormal finding of lung field: Secondary | ICD-10-CM | POA: Diagnosis not present

## 2011-04-29 DIAGNOSIS — E785 Hyperlipidemia, unspecified: Secondary | ICD-10-CM | POA: Diagnosis present

## 2011-04-29 DIAGNOSIS — J810 Acute pulmonary edema: Secondary | ICD-10-CM | POA: Diagnosis present

## 2011-04-29 DIAGNOSIS — I6350 Cerebral infarction due to unspecified occlusion or stenosis of unspecified cerebral artery: Secondary | ICD-10-CM

## 2011-04-29 DIAGNOSIS — J811 Chronic pulmonary edema: Secondary | ICD-10-CM

## 2011-04-29 DIAGNOSIS — I509 Heart failure, unspecified: Secondary | ICD-10-CM | POA: Diagnosis present

## 2011-04-29 DIAGNOSIS — I5032 Chronic diastolic (congestive) heart failure: Secondary | ICD-10-CM | POA: Diagnosis not present

## 2011-04-29 DIAGNOSIS — K219 Gastro-esophageal reflux disease without esophagitis: Secondary | ICD-10-CM | POA: Diagnosis present

## 2011-04-29 DIAGNOSIS — N186 End stage renal disease: Secondary | ICD-10-CM | POA: Diagnosis present

## 2011-04-29 DIAGNOSIS — R9431 Abnormal electrocardiogram [ECG] [EKG]: Secondary | ICD-10-CM

## 2011-04-29 DIAGNOSIS — D509 Iron deficiency anemia, unspecified: Secondary | ICD-10-CM | POA: Diagnosis not present

## 2011-04-29 DIAGNOSIS — E039 Hypothyroidism, unspecified: Secondary | ICD-10-CM | POA: Diagnosis present

## 2011-04-29 DIAGNOSIS — I10 Essential (primary) hypertension: Secondary | ICD-10-CM

## 2011-04-29 DIAGNOSIS — Z9861 Coronary angioplasty status: Secondary | ICD-10-CM | POA: Diagnosis not present

## 2011-04-29 DIAGNOSIS — G40309 Generalized idiopathic epilepsy and epileptic syndromes, not intractable, without status epilepticus: Secondary | ICD-10-CM | POA: Diagnosis not present

## 2011-04-29 DIAGNOSIS — E46 Unspecified protein-calorie malnutrition: Secondary | ICD-10-CM | POA: Diagnosis present

## 2011-04-29 DIAGNOSIS — J4489 Other specified chronic obstructive pulmonary disease: Secondary | ICD-10-CM | POA: Diagnosis present

## 2011-04-29 DIAGNOSIS — G9340 Encephalopathy, unspecified: Secondary | ICD-10-CM | POA: Diagnosis present

## 2011-04-29 DIAGNOSIS — R5381 Other malaise: Secondary | ICD-10-CM

## 2011-04-29 DIAGNOSIS — I739 Peripheral vascular disease, unspecified: Secondary | ICD-10-CM

## 2011-04-29 DIAGNOSIS — G40909 Epilepsy, unspecified, not intractable, without status epilepticus: Secondary | ICD-10-CM | POA: Diagnosis not present

## 2011-04-29 DIAGNOSIS — I6789 Other cerebrovascular disease: Secondary | ICD-10-CM | POA: Diagnosis not present

## 2011-04-29 DIAGNOSIS — F17200 Nicotine dependence, unspecified, uncomplicated: Secondary | ICD-10-CM

## 2011-04-29 DIAGNOSIS — M79609 Pain in unspecified limb: Secondary | ICD-10-CM

## 2011-04-29 DIAGNOSIS — Z992 Dependence on renal dialysis: Secondary | ICD-10-CM

## 2011-04-29 DIAGNOSIS — R4182 Altered mental status, unspecified: Secondary | ICD-10-CM | POA: Diagnosis not present

## 2011-04-29 DIAGNOSIS — G931 Anoxic brain damage, not elsewhere classified: Secondary | ICD-10-CM | POA: Diagnosis not present

## 2011-04-29 DIAGNOSIS — Z5189 Encounter for other specified aftercare: Secondary | ICD-10-CM | POA: Diagnosis not present

## 2011-04-29 DIAGNOSIS — D631 Anemia in chronic kidney disease: Secondary | ICD-10-CM | POA: Diagnosis not present

## 2011-04-29 DIAGNOSIS — F329 Major depressive disorder, single episode, unspecified: Secondary | ICD-10-CM | POA: Diagnosis present

## 2011-04-29 DIAGNOSIS — Z720 Tobacco use: Secondary | ICD-10-CM

## 2011-04-29 LAB — CBC
Hemoglobin: 11.6 g/dL — ABNORMAL LOW (ref 12.0–15.0)
Hemoglobin: 10.6 g/dL — ABNORMAL LOW (ref 12.0–15.0)
MCH: 25.7 pg — ABNORMAL LOW (ref 26.0–34.0)
MCHC: 32 g/dL (ref 30.0–36.0)
MCHC: 31.6 g/dL (ref 30.0–36.0)
MCV: 80.3 fL (ref 78.0–100.0)
Platelets: 127 10*3/uL — ABNORMAL LOW (ref 150–400)
Platelets: 126 10*3/uL — ABNORMAL LOW (ref 150–400)
RBC: 4.17 MIL/uL (ref 3.87–5.11)

## 2011-04-29 LAB — COMPREHENSIVE METABOLIC PANEL
Alkaline Phosphatase: 99 U/L (ref 39–117)
BUN: 13 mg/dL (ref 6–23)
CO2: 27 mEq/L (ref 19–32)
Chloride: 97 mEq/L (ref 96–112)
GFR calc Af Amer: 11 mL/min — ABNORMAL LOW (ref >90)
GFR calc non Af Amer: 10 mL/min — ABNORMAL LOW (ref >90)
Glucose, Bld: 112 mg/dL — ABNORMAL HIGH (ref 70–99)
Potassium: 3.7 mEq/L (ref 3.5–5.1)
Total Bilirubin: 0.8 mg/dL (ref 0.3–1.2)

## 2011-04-29 LAB — CREATININE, SERUM
Creatinine, Ser: 5.04 mg/dL — ABNORMAL HIGH (ref 0.50–1.10)
GFR calc non Af Amer: 8 mL/min — ABNORMAL LOW (ref >90)

## 2011-04-29 LAB — DIFFERENTIAL
Basophils Relative: 1 % (ref 0–1)
Eosinophils Absolute: 0.2 10*3/uL (ref 0.0–0.7)
Eosinophils Relative: 4 % (ref 0–5)
Monocytes Relative: 9 % (ref 3–12)
Neutrophils Relative %: 70 % (ref 43–77)

## 2011-04-29 LAB — POCT I-STAT, CHEM 8
Creatinine, Ser: 4.5 mg/dL — ABNORMAL HIGH (ref 0.50–1.10)
Glucose, Bld: 116 mg/dL — ABNORMAL HIGH (ref 70–99)
Hemoglobin: 13.6 g/dL (ref 12.0–15.0)
Potassium: 3.6 mEq/L (ref 3.5–5.1)
TCO2: 28 mmol/L (ref 0–100)

## 2011-04-29 LAB — CARDIAC PANEL(CRET KIN+CKTOT+MB+TROPI): CK, MB: 2.3 ng/mL (ref 0.3–4.0)

## 2011-04-29 LAB — ETHANOL: Alcohol, Ethyl (B): <11 mg/dL (ref 0–11)

## 2011-04-29 MED ORDER — ONDANSETRON HCL 4 MG PO TABS: 4.0000 mg | ORAL_TABLET | Freq: Four times a day (QID) | ORAL | Status: DC | PRN

## 2011-04-29 MED ORDER — SIMVASTATIN 40 MG PO TABS: 40.0000 mg | ORAL_TABLET | Freq: Every day | ORAL | Status: DC

## 2011-04-29 MED ORDER — HEPARIN SODIUM (PORCINE) 5000 UNIT/ML IJ SOLN
5000.0000 [IU] | Freq: Three times a day (TID) | INTRAMUSCULAR | Status: DC
  Filled 2011-04-29 (×3): qty 1

## 2011-04-29 MED ORDER — ACETAMINOPHEN 325 MG PO TABS
650.0000 mg | ORAL_TABLET | Freq: Four times a day (QID) | ORAL | Status: DC | PRN
  Administered 2011-04-29: 650 mg via ORAL
  Filled 2011-04-29: qty 2

## 2011-04-29 MED ORDER — POLYETHYLENE GLYCOL 3350 17 GM/SCOOP PO POWD
17.0000 g | Freq: Every day | ORAL | Status: DC | PRN
Start: 2011-04-29 — End: 2011-04-29
  Filled 2011-04-29: qty 255

## 2011-04-29 MED ORDER — RENA-VITE PO TABS
1.0000 | ORAL_TABLET | Freq: Every day | ORAL | Status: DC
  Administered 2011-04-29 – 2011-05-04 (×6): 1 via ORAL
  Filled 2011-04-29 (×6): qty 1

## 2011-04-29 MED ORDER — SODIUM CHLORIDE 0.9 % IJ SOLN
3.0000 mL | Freq: Two times a day (BID) | INTRAMUSCULAR | Status: DC
  Administered 2011-04-29 – 2011-05-03 (×8): 3 mL via INTRAVENOUS

## 2011-04-29 MED ORDER — VALPROATE SODIUM 500 MG/5ML IV SOLN
1300.0000 mg | Freq: Once | INTRAVENOUS | Status: AC
  Administered 2011-04-29: 1300 mg via INTRAVENOUS
  Filled 2011-04-29: qty 13

## 2011-04-29 MED ORDER — AMLODIPINE BESYLATE 10 MG PO TABS: 10.0000 mg | ORAL_TABLET | Freq: Every day | ORAL | Status: DC

## 2011-04-29 MED ORDER — HYDRALAZINE HCL 25 MG PO TABS: 25.0000 mg | ORAL_TABLET | Freq: Two times a day (BID) | ORAL | Status: DC

## 2011-04-29 MED ORDER — ACETAMINOPHEN 650 MG RE SUPP: 650.0000 mg | Freq: Four times a day (QID) | RECTAL | Status: DC | PRN

## 2011-04-29 MED ORDER — ALBUTEROL SULFATE (5 MG/ML) 0.5% IN NEBU: 2.5000 mg | INHALATION_SOLUTION | RESPIRATORY_TRACT | Status: DC | PRN

## 2011-04-29 MED ORDER — SODIUM CHLORIDE 0.9 % IV SOLN
INTRAVENOUS | Status: DC
  Administered 2011-04-29: 12:00:00 via INTRAVENOUS

## 2011-04-29 MED ORDER — AMLODIPINE BESYLATE 10 MG PO TABS
10.0000 mg | ORAL_TABLET | Freq: Every day | ORAL | Status: DC
  Administered 2011-04-29 – 2011-05-04 (×6): 10 mg via ORAL
  Filled 2011-04-29 (×6): qty 1

## 2011-04-29 MED ORDER — SIMVASTATIN 40 MG PO TABS
40.0000 mg | ORAL_TABLET | Freq: Every day | ORAL | Status: DC
  Administered 2011-04-29: 40 mg via ORAL
  Filled 2011-04-29 (×2): qty 1

## 2011-04-29 MED ORDER — FLUTICASONE-SALMETEROL 100-50 MCG/DOSE IN AEPB
1.0000 | INHALATION_SPRAY | Freq: Two times a day (BID) | RESPIRATORY_TRACT | Status: DC
  Administered 2011-04-29 – 2011-05-04 (×7): 1 via RESPIRATORY_TRACT
  Filled 2011-04-29: qty 14

## 2011-04-29 MED ORDER — PANTOPRAZOLE SODIUM 40 MG PO TBEC
40.0000 mg | DELAYED_RELEASE_TABLET | Freq: Every day | ORAL | Status: DC
  Administered 2011-04-29 – 2011-05-04 (×6): 40 mg via ORAL
  Filled 2011-04-29 (×6): qty 1

## 2011-04-29 MED ORDER — LEVOTHYROXINE SODIUM 88 MCG PO TABS
88.0000 ug | ORAL_TABLET | Freq: Every day | ORAL | Status: DC
  Administered 2011-04-29 – 2011-05-04 (×6): 88 ug via ORAL
  Filled 2011-04-29 (×6): qty 1

## 2011-04-29 MED ORDER — VALPROATE SODIUM 500 MG/5ML IV SOLN
500.0000 mg | Freq: Three times a day (TID) | INTRAVENOUS | Status: DC
  Administered 2011-04-29 – 2011-04-30 (×2): 500 mg via INTRAVENOUS
  Filled 2011-04-29 (×6): qty 5

## 2011-04-29 MED ORDER — HYDRALAZINE HCL 25 MG PO TABS
25.0000 mg | ORAL_TABLET | Freq: Two times a day (BID) | ORAL | Status: DC
  Administered 2011-04-29 – 2011-05-01 (×4): 25 mg via ORAL
  Filled 2011-04-29 (×6): qty 1

## 2011-04-29 MED ORDER — ASPIRIN EC 81 MG PO TBEC
81.0000 mg | DELAYED_RELEASE_TABLET | Freq: Every day | ORAL | Status: DC
  Administered 2011-04-29 – 2011-05-04 (×6): 81 mg via ORAL
  Filled 2011-04-29 (×6): qty 1

## 2011-04-29 MED ORDER — CITALOPRAM HYDROBROMIDE 20 MG PO TABS
20.0000 mg | ORAL_TABLET | Freq: Every day | ORAL | Status: DC
  Administered 2011-04-30 – 2011-05-04 (×5): 20 mg via ORAL
  Filled 2011-04-29 (×5): qty 1

## 2011-04-29 MED ORDER — HYDRALAZINE HCL 20 MG/ML IJ SOLN
10.0000 mg | Freq: Four times a day (QID) | INTRAMUSCULAR | Status: DC | PRN
  Administered 2011-04-29 – 2011-05-02 (×4): 10 mg via INTRAVENOUS
  Filled 2011-04-29 (×5): qty 1

## 2011-04-29 MED ORDER — NITROGLYCERIN 0.4 MG SL SUBL: 0.4000 mg | SUBLINGUAL_TABLET | SUBLINGUAL | Status: DC | PRN

## 2011-04-29 MED ORDER — SODIUM CHLORIDE 0.9 % IV SOLN
INTRAVENOUS | Status: DC
  Administered 2011-04-29: 16:00:00 via INTRAVENOUS

## 2011-04-29 MED ORDER — SEVELAMER CARBONATE 800 MG PO TABS
1600.0000 mg | ORAL_TABLET | Freq: Three times a day (TID) | ORAL | Status: DC
  Administered 2011-04-30 – 2011-05-04 (×12): 1600 mg via ORAL
  Filled 2011-04-29 (×16): qty 2

## 2011-04-29 MED ORDER — CARVEDILOL 25 MG PO TABS
25.0000 mg | ORAL_TABLET | Freq: Two times a day (BID) | ORAL | Status: DC
  Administered 2011-04-29 – 2011-05-04 (×9): 25 mg via ORAL
  Filled 2011-04-29 (×12): qty 1

## 2011-04-29 MED ORDER — ONDANSETRON HCL 4 MG/2ML IJ SOLN: 4.0000 mg | Freq: Four times a day (QID) | INTRAMUSCULAR | Status: DC | PRN

## 2011-04-29 MED ORDER — POLYETHYLENE GLYCOL 3350 17 G PO PACK
17.0000 g | PACK | Freq: Every day | ORAL | Status: DC | PRN
  Administered 2011-05-02: 17 g via ORAL
  Filled 2011-04-29: qty 1

## 2011-04-29 NOTE — Progress Notes (Addendum)
CARDIOLOGY CONSULT NOTE  Patient ID: [name] MRN: [id number] DOB/AGE: May 05, 1945 66 y.o.  Admit date: 04/29/2011 Primary Physician [name], MD Primary Cardiologist Dr. Antoine Poche Chief Complaint  Cardiac Arrest  HPI: The patient presented to the emergency room after generalized tonic-clonic seizure. This was followed by cardiac arrest. There are no strips to demonstrate rhythm. There was no defibrillation noted. Given her past history we are called to consult.  In the emergency room she is noted to have negative cardiac enzymes initially.  She does seem to have some edema on chest x-ray. Head CT was negative for acute findings.  EKG demonstrated no acute findings.  In 02/2011 she had cardiac catheterization demonstrating a normal LAD, circumflex posterior lateral 50% and a previously stented right coronary artery with an ostial 50% stenosis in the stented area. EF was 35 - 40%.  She was managed medically.  Currently she is in the stepdown unit.  She is confused and not able to fully answer questions.  She seems to deny any new symptoms such as chest discomfort, neck or arm discomfort. There has been no new shortness of breath, PND or orthopnea. There have been no reported palpitations, presyncope or syncope.  (Data recorded below verified through chart review.)  Past Medical History  Diagnosis Date  . CAD (coronary artery disease)     stent to RCA  . CVA (cerebral infarction) 2003    no apparent residual  . Renal failure     related to hypertension  . Hypothyroidism   . PVD (peripheral vascular disease)   . Hyperlipidemia   . Positive PPD     completed rifampin  . Diastolic congestive heart failure   . Renal insufficiency   . Hypertension   . COPD (chronic obstructive pulmonary disease) t  . Cancer     clear cell cancer, kidney  . Complication of anesthesia 12/2010    pt is very confused, with AMS with anesthesia    Past Surgical History  Procedure Date  .  Abdominal hysterectomy   . Cholecystectomy   . D&cs   . Right ankle repair   . Left nephrectomy   . Vascular surgery 11/2010    graft inserted to left arm    Allergies  Allergen Reactions  . Iohexol Swelling and Other (See Comments)    1970s; passed out and had facial/tongue swelling.  Requires 13-hour prep with prednisone and benadryl  . Ampicillin Hives and Rash  . Meperidine Hcl Swelling and Rash    Makes tongue swell  . Morphine Rash  . Penicillins Rash  . Heparin     MDs told her not to take after reaction in ICU  . Pentazocine Lactate     Patient does not remember reaction to this med (Talwin).   . Phenytoin Other (See Comments)    Had reaction while in ICU; doesn't know.  MDs told her not to take ever again.  . Ace Inhibitors Rash   Prescriptions prior to admission  Medication Sig Dispense Refill  . albuterol-ipratropium (COMBIVENT) 18-103 MCG/ACT inhaler Inhale 2 puffs into the lungs every 6 (six) hours as needed. For breathing      . amLODipine (NORVASC) 10 MG tablet Take 10 mg by mouth daily.       [name] aspirin EC 81 MG tablet Take 81 mg by mouth daily.       [name] buPROPion (WELLBUTRIN SR) 150 MG 12 hr tablet Take 150 mg by mouth 2 (two) times daily.      [name]  carvedilol (COREG) 25 MG tablet Take 25 mg by mouth 2 (two) times daily with a meal.       . citalopram (CELEXA) 20 MG tablet Take 20 mg by mouth daily.       [name] DEXILANT 60 MG capsule Take 60 mg by mouth daily.       . Fluticasone-Salmeterol (ADVAIR) 100-50 MCG/DOSE AEPB Inhale 1 puff into the lungs every 12 (twelve) hours.      . hydrALAZINE (APRESOLINE) 25 MG tablet Take 25 mg by mouth 2 (two) times daily.       [name] levothyroxine (SYNTHROID, LEVOTHROID) 88 MCG tablet Take 88 mcg by mouth daily.       . multivitamin (RENA-VIT) TABS tablet Take 1 tablet by mouth daily.       [name] NITROSTAT 0.4 MG SL tablet Place 0.4 mg under the tongue every 5 (five) minutes as needed. For chest pain.      . polyethylene glycol powder  (GLYCOLAX/MIRALAX) powder Take 17 g by mouth daily as needed. For constipation.      . pravastatin (PRAVACHOL) 40 MG tablet Take 80 mg by mouth at bedtime.       . sevelamer (RENVELA) 800 MG tablet Take 1,600 mg by mouth 3 (three) times daily with meals.       . traMADol (ULTRAM) 50 MG tablet Take 50 mg by mouth every 6 (six) hours as needed. For pain      . traZODone (DESYREL) 50 MG tablet Take 50-100 mg by mouth at bedtime.       Family History  Problem Relation Age of Onset  . Heart disease Father   . Hypertension Mother   . Dementia Mother   . Coronary artery disease Sister     History   Social History  . Marital Status: Legally Separated    Spouse Name: N/A    Number of Children: 2  . Years of Education: N/A   Occupational History  . Retired Engineer, civil (consulting)    Social History Main Topics  . Smoking status: Current Everyday Smoker -- 2.0 packs/day for 53 years    Types: Cigarettes  . Smokeless tobacco: Not on file   Comment: 1/2 ppd since age 59  . Alcohol Use: No  . Drug Use: No  . Sexually Active: Not Currently    Birth Control/ Protection: Post-menopausal   Other Topics Concern  . Not on file   Social History Narrative  . No narrative on file     ROS:  Unable to obtain from the patient except as above.  Physical Exam: Blood pressure 168/66, pulse 73, temperature 97.9 F (36.6 C), temperature source Rectal, resp. rate 19, SpO2 99.00%.  HEENT: Pupils equal round and reactive, fundi not visualized, oral mucosa unremarkable  NECK: No jugular venous distention, waveform within normal limits, carotid upstroke brisk and symmetric, transmitted systolic bruit, no thyromegaly  LYMPHATICS: No cervical, inguinal adenopathy  LUNGS: Clear to auscultation bilaterally  BACK: No CVA tenderness  CHEST: Bilateral basilar fine crackles  HEART: PMI not displaced or sustained,S1 and S2 within normal limits, no S3, no S4, no clicks, no rubs, apical systolic murmur early peaking and radiating  out the outflow tract.  ABD: Flat, positive bowel sounds normal in frequency in pitch, no bruits, no rebound, no guarding, no midline pulsatile mass, no hepatomegaly, no splenomegaly  EXT: 2 plus pulses throughout, right arm edema, clotted right arm fistula, thrill bruit left upper arm fistula, no cyanosis no clubbing  SKIN: No  rashes no nodules  NEURO: Cranial nerves II through XII grossly intact, motor grossly intact throughout  [facility]:  Somnolent and confused but she moves all extremities.     Labs: Lab Results  Component Value Date   BUN 12 04/29/2011   Lab Results  Component Value Date   CREATININE 4.50* 04/29/2011   Lab Results  Component Value Date   NA 139 04/29/2011   K 3.6 04/29/2011   CL 99 04/29/2011   CO2 27 04/29/2011   Lab Results  Component Value Date   CKTOTAL 35 02/23/2011   CKMB 4.9* 02/23/2011   TROPONINI <0.30 04/29/2011   Lab Results  Component Value Date   WBC 6.3 04/29/2011   HGB 13.6 04/29/2011   HCT 40.0 04/29/2011   MCV 80.3 04/29/2011   PLT 127* 04/29/2011   Lab Results  Component Value Date   CHOL 159 02/22/2011   HDL 44 02/22/2011   LDLCALC 87 02/22/2011   TRIG 140 02/22/2011   CHOLHDL 3.6 02/22/2011   Lab Results  Component Value Date   ALT 26 04/29/2011   AST 37 04/29/2011   ALKPHOS 99 04/29/2011   BILITOT 0.8 04/29/2011   Radiology: CT Head 1. No acute abnormality. 2. Atrophy, chronic small vessel white matter ischemic changes and old infarcts, as described above.  CXR Interval cardiomegaly and alveolar pulmonary edema and/or pulmonary contusions.  EKG:  NSR rate 90, no acute ST T wave changes.  ASSESSMENT AND PLAN:   1)  Cardiac arrest:  I will have to question the patient further as she wakes up to see if she has had any recent cardiac symptoms.  For now I don't expect an acute cardiac event but rather a post seizure event.  There are no strips for me to see of the actual event.  Cycle cardiac enzymes.  Echocardiogram to evaluate EF  compared with earlier this year.    2)  HTN:  To restart outpatient meds as she wakes up and can take orals.    SignedRollene Rotunda 04/29/2011, 2:58 PM

## 2011-04-29 NOTE — ED Notes (Signed)
Per EMS: patient from dialysis, received half of treatment, had to use the bathroom and suddenly had grand MAL seizure lasting 5 min.  Patient then became unresponsive after seizure, facility preformed CPR for 5 min, then patient regained pulses.  No defibrillation was preformed.  CBG 95. Patient confused, not following commands which is not her norm.

## 2011-04-29 NOTE — ED Notes (Signed)
Patient presents post-ictal from a grand mal seizure prior to arrival.  Patient does follow some commands now and is alert to person, answers only limited questions.  Patient was initially confused and restless, would not answer questions or follow commands, patient also initially contracted to upper and lower extremities.  Family at bedside, patient is now calmly resting, hypertensive on monitor, ABC's intact.

## 2011-04-29 NOTE — Consult Note (Signed)
[facility] Renal Consultation Note  Indication for Consultation:  Management of ESRD/hemodialysis; anemia, hypertension/volume and secondary hyperparathyroidism  HPI: [name] is a 66 y.o. female with ESRD on HD at [facility] who had a generalized tonic-clonic seizure during the last hour of dialysis today, became unresponsive, and stopped breathing before receiving five minutes of chest compressions.  EMS was called, and the patient was stable when she was transported to the ED.  She has a history of seizure activity and was on medication, which was stopped after no further seizures.  Currently she is very groggy and unable to answer all questions.  Dialysis Orders: Center: [place]  on TTS . EDW 66 kg   HD Bath 2K/2Ca  Time 4 hrs  Heparin 0. Access HeRO @ [facility]  BFR 450 DFR 800    Zemplar 7 mcg IV/HD   Epogen 0 Units IV/HD  Venofer  100 mg qwk  Past Medical History  Diagnosis Date  . CAD (coronary artery disease)     stent to RCA  . CVA (cerebral infarction) 2003    no apparent residual  . Renal failure     related to hypertension  . Hypothyroidism   . PVD (peripheral vascular disease)   . Hyperlipidemia   . Positive PPD     completed rifampin  . Diastolic congestive heart failure   . Renal insufficiency   . Hypertension   . COPD (chronic obstructive pulmonary disease) t  . Cancer     clear cell cancer, kidney  . Complication of anesthesia 12/2010    pt is very confused, with AMS with anesthesia   Past Surgical History  Procedure Date  . Abdominal hysterectomy   . Cholecystectomy   . D&cs   . Right ankle repair   . Left nephrectomy   . Vascular surgery 11/2010    graft inserted to left arm   Family History  Problem Relation Age of Onset  . Heart disease Father   . Hypertension Mother   . Dementia Mother   . Coronary artery disease Sister    Social History She has a 106 pack-year smoking history and continues to smoke cigarettes.  She does not have any  smokeless tobacco history on file. She reports that she does not drink alcohol or use illicit drugs.  Allergies  Allergen Reactions  . Iohexol Swelling and Other (See Comments)    1970s; passed out and had facial/tongue swelling.  Requires 13-hour prep with prednisone and benadryl  . Ampicillin Hives and Rash  . Meperidine Hcl Swelling and Rash    Makes tongue swell  . Morphine Rash  . Penicillins Rash  . Heparin     MDs told her not to take after reaction in ICU  . Pentazocine Lactate     Patient does not remember reaction to this med (Talwin).   . Phenytoin Other (See Comments)    Had reaction while in ICU; doesn't know.  MDs told her not to take ever again.  . Ace Inhibitors Rash   Prior to Admission medications   Medication Sig Start Date End Date Taking? Authorizing Provider  albuterol-ipratropium (COMBIVENT) 18-103 MCG/ACT inhaler Inhale 2 puffs into the lungs every 6 (six) hours as needed. For breathing    Historical Provider, MD  amLODipine (NORVASC) 10 MG tablet Take 10 mg by mouth daily.     Historical Provider, MD  aspirin EC 81 MG tablet Take 81 mg by mouth daily.   11/29/10 11/29/11  Rollene Rotunda,  MD  carvedilol (COREG) 25 MG tablet Take 25 mg by mouth 2 (two) times daily with a meal.  10/30/10   Historical Provider, MD  citalopram (CELEXA) 20 MG tablet Take 20 mg by mouth daily.     Historical Provider, MD  DEXILANT 60 MG capsule Take 60 mg by mouth daily.  11/27/10   Historical Provider, MD  Fluticasone-Salmeterol (ADVAIR) 100-50 MCG/DOSE AEPB Inhale 1 puff into the lungs every 12 (twelve) hours.    Historical Provider, MD  hydrALAZINE (APRESOLINE) 25 MG tablet Take 25 mg by mouth 2 (two) times daily.  11/07/10   Historical Provider, MD  levothyroxine (SYNTHROID, LEVOTHROID) 88 MCG tablet Take 88 mcg by mouth daily.      Historical Provider, MD  multivitamin (RENA-VIT) TABS tablet Take 1 tablet by mouth daily.      Historical Provider, MD  NITROSTAT 0.4 MG SL tablet  Place 0.4 mg under the tongue every 5 (five) minutes as needed. For chest pain. 09/16/10   Historical Provider, MD  polyethylene glycol powder (GLYCOLAX/MIRALAX) powder Take 17 g by mouth daily as needed. For constipation. 09/28/10   Historical Provider, MD  pravastatin (PRAVACHOL) 40 MG tablet Take 80 mg by mouth at bedtime.      Historical Provider, MD  sevelamer (RENVELA) 800 MG tablet Take 1,600 mg by mouth 3 (three) times daily with meals.      Historical Provider, MD   Labs:  Results for orders placed during the hospital encounter of 04/29/11 (from the past 48 hour(s))  CBC     Status: Abnormal   Collection Time   04/29/11 11:12 AM      Component Value Range Comment   WBC 6.3  4.0 - 10.5 (K/uL)    RBC 4.51  3.87 - 5.11 (MIL/uL)    Hemoglobin 11.6 (*) 12.0 - 15.0 (g/dL)    HCT 16.1  09.6 - 04.5 (%)    MCV 80.3  78.0 - 100.0 (fL)    MCH 25.7 (*) 26.0 - 34.0 (pg)    MCHC 32.0  30.0 - 36.0 (g/dL)    RDW 40.9 (*) 81.1 - 15.5 (%)    Platelets 127 (*) 150 - 400 (K/uL) CONSISTENT WITH PREVIOUS RESULT  DIFFERENTIAL     Status: Normal   Collection Time   04/29/11 11:12 AM      Component Value Range Comment   Neutrophils Relative 70  43 - 77 (%)    Neutro Abs 4.4  1.7 - 7.7 (K/uL)    Lymphocytes Relative 17  12 - 46 (%)    Lymphs Abs 1.1  0.7 - 4.0 (K/uL)    Monocytes Relative 9  3 - 12 (%)    Monocytes Absolute 0.6  0.1 - 1.0 (K/uL)    Eosinophils Relative 4  0 - 5 (%)    Eosinophils Absolute 0.2  0.0 - 0.7 (K/uL)    Basophils Relative 1  0 - 1 (%)    Basophils Absolute 0.0  0.0 - 0.1 (K/uL)   COMPREHENSIVE METABOLIC PANEL     Status: Abnormal   Collection Time   04/29/11 11:12 AM      Component Value Range Comment   Sodium 137  135 - 145 (mEq/L)    Potassium 3.7  3.5 - 5.1 (mEq/L)    Chloride 97  96 - 112 (mEq/L)    CO2 27  19 - 32 (mEq/L)    Glucose, Bld 112 (*) 70 - 99 (mg/dL)    BUN 13  6 - 23 (mg/dL)    Creatinine, Ser 0.86 (*) 0.50 - 1.10 (mg/dL)    Calcium 9.4  8.4 - 10.5  (mg/dL)    Total Protein 6.7  6.0 - 8.3 (g/dL)    Albumin 3.4 (*) 3.5 - 5.2 (g/dL)    AST 37  0 - 37 (U/L)    ALT 26  0 - 35 (U/L)    Alkaline Phosphatase 99  39 - 117 (U/L)    Total Bilirubin 0.8  0.3 - 1.2 (mg/dL)    GFR calc non Af Amer 10 (*) >90 (mL/min)    GFR calc Af Amer 11 (*) >90 (mL/min)   ETHANOL     Status: Normal   Collection Time   04/29/11 11:12 AM      Component Value Range Comment   Alcohol, Ethyl (B) <11  0 - 11 (mg/dL)   TROPONIN I     Status: Normal   Collection Time   04/29/11 11:12 AM      Component Value Range Comment   Troponin I <0.30  <0.30 (ng/mL)   GLUCOSE, CAPILLARY     Status: Abnormal   Collection Time   04/29/11 11:29 AM      Component Value Range Comment   Glucose-Capillary 107 (*) 70 - 99 (mg/dL)   POCT I-STAT, CHEM 8     Status: Abnormal   Collection Time   04/29/11 11:32 AM      Component Value Range Comment   Sodium 139  135 - 145 (mEq/L)    Potassium 3.6  3.5 - 5.1 (mEq/L)    Chloride 99  96 - 112 (mEq/L)    BUN 12  6 - 23 (mg/dL)    Creatinine, Ser 5.78 (*) 0.50 - 1.10 (mg/dL)    Glucose, Bld 469 (*) 70 - 99 (mg/dL)    Calcium, Ion 6.29 (*) 1.12 - 1.32 (mmol/L)    TCO2 28  0 - 100 (mmol/L)    Hemoglobin 13.6  12.0 - 15.0 (g/dL)    HCT 52.8  41.3 - 24.4 (%)    Unable to obtain a comprehensive review of systems.  Physical Exam: Filed Vitals:   04/29/11 1142  BP: 162/134  Pulse: 81  Temp: 97.9 F (36.6 C)  Resp: 20     General appearance: mild distress, uncooperative and somnolent Head: Normocephalic, without obvious abnormality, atraumatic Neck: no adenopathy, no carotid bruit, no JVD and supple, symmetrical, trachea midline Resp: clear to auscultation bilaterally Cardio: regular rate and rhythm, S1, S2 normal, no murmur, click, rub or gallop GI: soft, non-tender; bowel sounds normal; no masses,  no organomegaly Extremities: extremities normal, atraumatic, no cyanosis or edema Neurologic: Somnolent, difficult to  arouse Dialysis Access: HeRO @ [facility] with + bruit   Assessment/Plan: 1. Seizure activity - generalized tonic-clonic seizure during HD with unresponsiveness requiring CPR; history of epilepsy requiring medication, but recently off meds; CT of head with no acute abnormality; now on Valproic acid IV per neurology (allergic to Dilantin).   2. ESRD -  HD on TTS @ [facility]; completed 3 of 4-hr HD today, K stable at 3.6. 3. Hypertension/volume  - BP most recently 168/66, on Amlodipine 10 mg qd, Coreg 25 mg bid, and Hydralazine 25 mg bid as outpatient; chest x-ray negative. 4. Anemia  - Hgb 11.6 off Epogen, but weekly IV Fe. 5. Metabolic bone disease -  Ca 9.4, P usually 4-5, on Zemplar 7 mcg per HD, Sensipar 30 mg qd, and Renvela 3 with meals  and 2 with snacks. 6. Nutrition - last Alb 3.8, recently on Megace. 7. CAD - s/p MI with stenting in 01/2009, followed by Dr. Antoine Poche. 8. Hypothyroidism - on Levothyroxine 50 mcg qd. 9. Depression - on Celexa.  LYLES,CHARLES 04/29/2011, 12:50 PM   Attending Nephrologist: Delano Metz, MD  Patient seen and examined and agree with assessment and plan as above.  Vinson Moselle  MD [facility] [phone] pgr    [phone] cell 04/29/2011, 8:18 PM

## 2011-04-29 NOTE — ED Notes (Signed)
Pt oxygen sats noted to be 78%, placed on 4 L. sats came up to 88%. EDP aware, reporting get the chest xray and we will reevaluate. Pulse ox currently on toe, attempt to place pulse ox on finger, back, forehead.

## 2011-04-29 NOTE — ED Notes (Signed)
Attempted to call report to [phone], RN unavailable and will call back

## 2011-04-29 NOTE — ED Notes (Signed)
Patient still communicating very minimally, family at bedside, reporting this is not her norm.

## 2011-04-29 NOTE — H&P (Signed)
PATIENT DETAILS Name: [name] Age: 66 y.o. Sex: female Date of Birth: 1945-07-25 Admit Date: 04/29/2011 ZOX:[id number] N, MD, MD   CHIEF COMPLAINT:  Generalized tonic-clonic seizures followed by cardiac arrest   HPI: Patient is a 66 year old African American female, with a past medical history of end-stage renal disease on hemodialysis, prior history of seizures apparently all antiseizure medication was stopped approximately a year ago, recent history of taka Tsubo cardiomyopathy, history of coronary artery disease, history of CVA who was brought to the ED from her hemodialysis center after she suffered a cardiac arrest following a generalized tonic-clonic seizure that was witnessed by the nursing staff there. It is unknown what the initial rhythm was, however from the nursing documentation in the ED she was never defibrillated. She was post ictal when she got to the emergency room, she is now slowly waking up, although still lethargic and drowsy. She denies any recent history of fever, headache, chest pain or shortness of breath. Her 2 daughters at bedside, they claimed that the patient lives alone and was in good health prior to this event. She is a known DO NOT RESUSCITATE, she is now being admitted for further evaluation by the hospitalist team to a step down unit.  ALLERGIES:   Allergies  Allergen Reactions  . Iohexol Swelling and Other (See Comments)    1970s; passed out and had facial/tongue swelling.  Requires 13-hour prep with prednisone and benadryl  . Ampicillin Hives and Rash  . Meperidine Hcl Swelling and Rash    Makes tongue swell  . Morphine Rash  . Penicillins Rash  . Heparin     MDs told her not to take after reaction in ICU  . Pentazocine Lactate     Patient does not remember reaction to this med ([place]).   . Phenytoin Other (See Comments)    Had reaction while in ICU; doesn't know.  MDs told her not to take ever again.  Donivan Scull Inhibitors Rash    PAST  MEDICAL HISTORY: Past Medical History  Diagnosis Date  . CAD (coronary artery disease)     stent to RCA  . CVA (cerebral infarction) 2003    no apparent residual  . Renal failure     related to hypertension  . Hypothyroidism   . PVD (peripheral vascular disease)   . Hyperlipidemia   . Positive PPD     completed rifampin  . Diastolic congestive heart failure   . Renal insufficiency   . Hypertension   . COPD (chronic obstructive pulmonary disease) t  . Cancer     clear cell cancer, kidney  . Complication of anesthesia 12/2010    pt is very confused, with AMS with anesthesia    PAST SURGICAL HISTORY: Past Surgical History  Procedure Date  . Abdominal hysterectomy   . Cholecystectomy   . D&cs   . Right ankle repair   . Left nephrectomy   . Vascular surgery 11/2010    graft inserted to left arm    MEDICATIONS AT HOME: Prior to Admission medications   Medication Sig Start Date End Date Taking? Authorizing Provider  albuterol-ipratropium (COMBIVENT) 18-103 MCG/ACT inhaler Inhale 2 puffs into the lungs every 6 (six) hours as needed. For breathing   Yes Historical Provider, MD  amLODipine (NORVASC) 10 MG tablet Take 10 mg by mouth daily.    Yes Historical Provider, MD  carvedilol (COREG) 25 MG tablet Take 25 mg by mouth 2 (two) times daily with a meal.  10/30/10  Yes Historical Provider, MD  citalopram (CELEXA) 20 MG tablet Take 20 mg by mouth daily.    Yes Historical Provider, MD  DEXILANT 60 MG capsule Take 60 mg by mouth daily.  11/27/10  Yes Historical Provider, MD  hydrALAZINE (APRESOLINE) 25 MG tablet Take 25 mg by mouth 2 (two) times daily.  11/07/10  Yes Historical Provider, MD  levothyroxine (SYNTHROID, LEVOTHROID) 88 MCG tablet Take 88 mcg by mouth daily.    Yes Historical Provider, MD  multivitamin (RENA-VIT) TABS tablet Take 1 tablet by mouth daily.    Yes Historical Provider, MD  polyethylene glycol powder (GLYCOLAX/MIRALAX) powder Take 17 g by mouth daily as needed.  For constipation. 09/28/10  Yes Historical Provider, MD  pravastatin (PRAVACHOL) 40 MG tablet Take 80 mg by mouth at bedtime.    Yes Historical Provider, MD  sevelamer (RENVELA) 800 MG tablet Take 1,600 mg by mouth 3 (three) times daily with meals.    Yes Historical Provider, MD  traMADol (ULTRAM) 50 MG tablet Take 50 mg by mouth every 6 (six) hours as needed. For pain   Yes Historical Provider, MD  aspirin EC 81 MG tablet Take 81 mg by mouth daily.  11/29/10 11/29/11  Rollene Rotunda, MD  Fluticasone-Salmeterol (ADVAIR) 100-50 MCG/DOSE AEPB Inhale 1 puff into the lungs every 12 (twelve) hours.    Historical Provider, MD  NITROSTAT 0.4 MG SL tablet Place 0.4 mg under the tongue every 5 (five) minutes as needed. For chest pain. 09/16/10   Historical Provider, MD    FAMILY HISTORY: Family History  Problem Relation Age of Onset  . Heart disease Father   . Hypertension Mother   . Dementia Mother   . Coronary artery disease Sister     SOCIAL HISTORY:  reports that she has been smoking Cigarettes.  She has a 106 pack-year smoking history. She does not have any smokeless tobacco history on file. She reports that she does not drink alcohol or use illicit drugs.  REVIEW OF SYSTEMS:  Constitutional:   No  weight loss, night sweats,  Fevers, chills, fatigue.  HEENT:    No headaches, Difficulty swallowing,Tooth/dental problems,Sore throat,  No sneezing, itching, ear ache, nasal congestion, post nasal drip,   Cardio-vascular: No chest pain,  Orthopnea, PND, swelling in lower extremities, anasarca,  dizziness, palpitations  GI:  No heartburn, indigestion, abdominal pain, nausea, vomiting, diarrhea, change in bowel habits, loss of appetite  Resp: No shortness of breath with exertion or at rest.  No excess mucus, no productive cough, No non-productive cough,  No coughing up of blood.No change in color of mucus.No wheezing.No chest wall deformity  Skin:  no rash or lesions.  GU:  no dysuria,  change in color of urine, no urgency or frequency.  No flank pain.  Musculoskeletal: No joint pain or swelling.  No decreased range of motion.  No back pain.  Psych: No change in mood or affect. No depression or anxiety.  No memory loss.   PHYSICAL EXAM: Blood pressure 168/66, pulse 73, temperature 97.9 F (36.6 C), temperature source Rectal, resp. rate 19, SpO2 99.00%.  General appearance :Awake, alert, not in any distress. Speech Clear. Not toxic Looking HEENT: Atraumatic and Normocephalic, pupils equally reactive to light and accomodation Neck: supple, no JVD. No cervical lymphadenopathy.  Chest:Good air entry bilaterally, no added sounds  CVS: S1 S2 regular, no murmurs.  Abdomen: Bowel sounds present, Non tender and not distended with no gaurding, rigidity or rebound. Extremities: B/L Lower Ext shows  no edema, both legs are warm to touch, with  dorsalis pedis pulses palpable. Neurology: Awake alert, and oriented X 3, CN II-XII intact, Non focal,  Skin:No Rash Wounds:N/A  LABS ON ADMISSION:   Basename 04/29/11 1132 04/29/11 1112  NA 139 137  K 3.6 3.7  CL 99 97  CO2 -- 27  GLUCOSE 116* 112*  BUN 12 13  CREATININE 4.50* 4.41*  CALCIUM -- 9.4  MG -- --  PHOS -- --    Basename 04/29/11 1112  AST 37  ALT 26  ALKPHOS 99  BILITOT 0.8  PROT 6.7  ALBUMIN 3.4*   No results found for this basename: LIPASE:2,AMYLASE:2 in the last 72 hours  Basename 04/29/11 1132 04/29/11 1112  WBC -- 6.3  NEUTROABS -- 4.4  HGB 13.6 11.6*  HCT 40.0 36.2  MCV -- 80.3  PLT -- 127*    Basename 04/29/11 1112  CKTOTAL --  CKMB --  CKMBINDEX --  TROPONINI <0.30   No results found for this basename: DDIMER:2 in the last 72 hours No components found with this basename: POCBNP:3   RADIOLOGIC STUDIES ON ADMISSION: Ct Head Wo Contrast  04/29/2011  *RADIOLOGY REPORT*  Clinical Data: Seizure during dialysis.  CPR post seizure.  CT HEAD WITHOUT CONTRAST  Technique:  Contiguous axial  images were obtained from the base of the skull through the vertex without contrast.  Comparison: 02/22/2011.  Findings: Stable old left posterior watershed infarct, old left frontal infarct and old right cerebellar infarct.  Stable enlarged ventricles and subarachnoid spaces.  Better visualized old bilateral thalamic infarcts.  No intracranial hemorrhage, mass lesion or CT evidence of acute infarction.  Bilateral hyperostosis frontalis.  Atheromatous cavernous carotid artery calcifications. Minimal left sphenoid sinus mucosal thickening.  IMPRESSION:  1.  No acute abnormality. 2.  Atrophy, chronic small vessel white matter ischemic changes and old infarcts, as described above.  Original Report Authenticated By: Darrol Angel, M.D.   Dg Chest Port 1 View  04/29/2011  *RADIOLOGY REPORT*  Clinical Data: Unresponsive following CPR after a seizure.  PORTABLE CHEST - 1 VIEW  Comparison: 02/24/2011.  Findings: Enlarged cardiac silhouette with an interval increase in the size.  Interval ill-defined airspace opacity throughout the right lung and, to a lesser degree, in the left lung.  No visible pleural fluid.  No fracture or pneumothorax seen.  Thoracic spine degenerative changes.  IMPRESSION: Interval cardiomegaly and alveolar pulmonary edema and/or pulmonary contusions.  Original Report Authenticated By: Darrol Angel, M.D.    ASSESSMENT AND PLAN: Present on Admission:  .Cardiac arrest -This was following a witnessed generalized tonic-clonic seizure while on hemodialysis  -Unclear exactly what the initial rhythm was, in all likelihood this is related to her having a generalized tonic-clonic seizure, however she does have extensive cardiac history of coronary artery disease and recent history of stress cardiomyopathy, as a result I have spoken with [facility] cardiology and they will evaluate the patient and make further recommendations as well.  -DO NOT RESUSCITATE status has been reconfirmed with both her  daughters at bedside.   .Seizure -Patient does have a prior history of generalized tonic-clonic seizures, apparently she has been off medication for a few years  -She has already been seen by neurology, and they have already loaded the patient with Depakote. A CT of the head done in the emergency room is negative.  -It is not clear to me what the exact precipitating factor was for her seizure.  .Pulmonary edema -This is seen  on chest x-ray, she has apparently completed three out of four hours of dialysis this morning -Likely this is noncardiogenic pulmonary edema-possibly neurogenic -following the seizure  .Altered mental state -Likely post ictal state following a seizure, per nursing staff and the daughter is at bedside this is slowly resolving. We'll continue to keep an eye and this. She has had a CT of the head is negative and this is reassuring. She does not have leukocytosis or white count that suggests this is from an infection.  Marland KitchenCAD -Initial cardiac enzymes are negative, her EKG is also almost unchanged  -Continue to cycle cardiac enzymes, cardiology has been informed of her admission and they will follow as well.  -She will be continued on aspirin  .ESRD -Renal team has already evaluated the patient, we will defer this to the renal team.   . Hypertension  -For now we will hold all her antihypertensive medications to her mental status improves.   . History of CVA -Lethargic from her being in a postictal state, she is moving all of her 4 extremities and appears nonfocal -Aspirin will be continued  Further plan will depend as patient's clinical course evolves and further radiologic and laboratory data become available. Patient will be monitored closely.  DVT Prophylaxis: Prophylactic heparin  Code Status: DO NOT RESUSCITATE  Total time spent for admission equals 45 minutes.  Jeoffrey Massed 04/29/2011, 2:20 PM

## 2011-04-29 NOTE — ED Provider Notes (Signed)
History     CSN: [id number]  Arrival date & time 04/29/11  1106   First MD Initiated Contact with Patient 04/29/11 1110      Chief Complaint  Patient presents with  . Altered Mental Status  level 5 caveat due to altered mental status.  (Consider location/radiation/quality/duration/timing/severity/associated sxs/prior treatment) Patient is a 66 y.o. female presenting with altered mental status. The history is provided by the patient and the EMS personnel.  Altered Mental Status  patient came in after a seizure and cardiac arrest at dialysis. She reportedly had a five-minute grand mal seizure. After that she was responsive and apneic without a pulse. CPR was performed 5 minutes. No defibrillation was done. CBG was normal. Patient became confused does not follow commands. Her mental status had improved somewhat on arrival. Initial sats were in the upper 80s.  Past Medical History  Diagnosis Date  . CAD (coronary artery disease)     stent to RCA  . CVA (cerebral infarction) 2003    no apparent residual  . Renal failure     related to hypertension  . Hypothyroidism   . PVD (peripheral vascular disease)   . Hyperlipidemia   . Positive PPD     completed rifampin  . Diastolic congestive heart failure   . Renal insufficiency   . Hypertension   . COPD (chronic obstructive pulmonary disease) t  . Cancer     clear cell cancer, kidney  . Complication of anesthesia 12/2010    pt is very confused, with AMS with anesthesia    Past Surgical History  Procedure Date  . Abdominal hysterectomy   . Cholecystectomy   . D&cs   . Right ankle repair   . Left nephrectomy   . Vascular surgery 11/2010    graft inserted to left arm    Family History  Problem Relation Age of Onset  . Heart disease Father   . Hypertension Mother   . Dementia Mother   . Coronary artery disease Sister     History  Substance Use Topics  . Smoking status: Current Everyday Smoker -- 2.0 packs/day for 53  years    Types: Cigarettes  . Smokeless tobacco: Not on file   Comment: 1/2 ppd since age 6  . Alcohol Use: No    OB History    Grav Para Term Preterm Abortions TAB SAB Ect Mult Living                  Review of Systems  Unable to perform ROS Psychiatric/Behavioral: Positive for altered mental status.    Allergies  Iohexol; Ampicillin; Meperidine hcl; Morphine; Penicillins; Heparin; Pentazocine lactate; Phenytoin; and Ace inhibitors  Home Medications  No current outpatient prescriptions on file.  BP 191/78  Pulse 71  Temp(Src) 98.2 F (36.8 C) (Oral)  Resp 22  Ht 5\' 5"  (1.651 m)  Wt 150 lb 5.7 oz (68.2 kg)  BMI 25.02 kg/m2  SpO2 23%  Physical Exam  Constitutional: She appears well-developed.  HENT:  Head: Normocephalic.  Eyes:       Pupils are reactive, but somewhat sluggish  Neck: Normal range of motion.  Cardiovascular: Normal rate.   Pulmonary/Chest:       Mildly harsh breath sounds bilaterally  Abdominal: Soft.  Musculoskeletal:       Dialysis needles in left upper extremity.  Neurological:       Patient is confused. She is minimally verbal. She'll not follow commands. She is moving both sides symmetrically  Skin: Skin is warm.    ED Course  Procedures (including critical care time)  Labs Reviewed  CBC - Abnormal; Notable for the following:    Hemoglobin 11.6 (*)    MCH 25.7 (*)    RDW 17.5 (*)    Platelets 127 (*) CONSISTENT WITH PREVIOUS RESULT   All other components within normal limits  COMPREHENSIVE METABOLIC PANEL - Abnormal; Notable for the following:    Glucose, Bld 112 (*)    Creatinine, Ser 4.41 (*)    Albumin 3.4 (*)    GFR calc non Af Amer 10 (*)    GFR calc Af Amer 11 (*)    All other components within normal limits  GLUCOSE, CAPILLARY - Abnormal; Notable for the following:    Glucose-Capillary 107 (*)    All other components within normal limits  POCT I-STAT, CHEM 8 - Abnormal; Notable for the following:    Creatinine, Ser  4.50 (*)    Glucose, Bld 116 (*)    Calcium, Ion 1.09 (*)    All other components within normal limits  CBC - Abnormal; Notable for the following:    Hemoglobin 10.6 (*) DELTA CHECK NOTED   HCT 33.5 (*)    MCH 25.4 (*)    RDW 17.2 (*)    Platelets 126 (*) CONSISTENT WITH PREVIOUS RESULT   All other components within normal limits  DIFFERENTIAL  ETHANOL  TROPONIN I  CREATININE, SERUM  CARDIAC PANEL(CRET KIN+CKTOT+MB+TROPI)  CARDIAC PANEL(CRET KIN+CKTOT+MB+TROPI)  MRSA PCR SCREENING   Ct Head Wo Contrast  04/29/2011  *RADIOLOGY REPORT*  Clinical Data: Seizure during dialysis.  CPR post seizure.  CT HEAD WITHOUT CONTRAST  Technique:  Contiguous axial images were obtained from the base of the skull through the vertex without contrast.  Comparison: 02/22/2011.  Findings: Stable old left posterior watershed infarct, old left frontal infarct and old right cerebellar infarct.  Stable enlarged ventricles and subarachnoid spaces.  Better visualized old bilateral thalamic infarcts.  No intracranial hemorrhage, mass lesion or CT evidence of acute infarction.  Bilateral hyperostosis frontalis.  Atheromatous cavernous carotid artery calcifications. Minimal left sphenoid sinus mucosal thickening.  IMPRESSION:  1.  No acute abnormality. 2.  Atrophy, chronic small vessel white matter ischemic changes and old infarcts, as described above.  Original Report Authenticated By: Darrol Angel, M.D.   Dg Chest Port 1 View  04/29/2011  *RADIOLOGY REPORT*  Clinical Data: Unresponsive following CPR after a seizure.  PORTABLE CHEST - 1 VIEW  Comparison: 02/24/2011.  Findings: Enlarged cardiac silhouette with an interval increase in the size.  Interval ill-defined airspace opacity throughout the right lung and, to a lesser degree, in the left lung.  No visible pleural fluid.  No fracture or pneumothorax seen.  Thoracic spine degenerative changes.  IMPRESSION: Interval cardiomegaly and alveolar pulmonary edema and/or  pulmonary contusions.  Original Report Authenticated By: Darrol Angel, M.D.     1. Seizure   2. End stage renal disease on dialysis   3. Cardiac arrest   4. Coronary atherosclerosis of unspecified type of vessel, native or graft    CRITICAL CARE Performed by: Billee Cashing   Total critical care time: 30  Critical care time was exclusive of separately billable procedures and treating other patients.  Critical care was necessary to treat or prevent imminent or life-threatening deterioration.  Critical care was time spent personally by me on the following activities: development of treatment plan with patient and/or surrogate as well as nursing, discussions with  consultants, evaluation of patient's response to treatment, examination of patient, obtaining history from patient or surrogate, ordering and performing treatments and interventions, ordering and review of laboratory studies, ordering and review of radiographic studies, pulse oximetry and re-evaluation of patient's condition.   Date: 04/29/2011  Rate: 90  Rhythm: normal sinus rhythm  QRS Axis: normal  Intervals: normal  ST/T Wave abnormalities: nonspecific ST/T changes  Conduction Disutrbances:none  Narrative Interpretation: improved.  Old EKG Reviewed: changes noted    MDM  Patient he is a came from dialysis after seizure CPR. Initially sats were decreased. It is improved with time. Critical care had been consulted do to persistent hypoxia. Patient is a DO NOT RESUSCITATE.patient has improved with stay. Patient will be admitted to medicine. Nephrology saw the patient in ER did not think the patient needed emergent dialysis        Juliet Rude. Rubin Payor, MD 04/29/11 [phone]

## 2011-04-29 NOTE — Progress Notes (Signed)
Pt had a seizure lasting approximately 3 minutes, witnessed by RN and family; seizure precautions in place; NP notified; will continue to monitor
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
## 2011-04-30 DIAGNOSIS — Z720 Tobacco use: Secondary | ICD-10-CM | POA: Diagnosis present

## 2011-04-30 DIAGNOSIS — I10 Essential (primary) hypertension: Secondary | ICD-10-CM | POA: Diagnosis present

## 2011-04-30 DIAGNOSIS — F32A Depression, unspecified: Secondary | ICD-10-CM | POA: Diagnosis present

## 2011-04-30 DIAGNOSIS — G9340 Encephalopathy, unspecified: Secondary | ICD-10-CM | POA: Diagnosis present

## 2011-04-30 DIAGNOSIS — I319 Disease of pericardium, unspecified: Secondary | ICD-10-CM

## 2011-04-30 DIAGNOSIS — K219 Gastro-esophageal reflux disease without esophagitis: Secondary | ICD-10-CM | POA: Diagnosis present

## 2011-04-30 DIAGNOSIS — F329 Major depressive disorder, single episode, unspecified: Secondary | ICD-10-CM | POA: Diagnosis present

## 2011-04-30 LAB — GLUCOSE, CAPILLARY
Glucose-Capillary: 119 mg/dL — ABNORMAL HIGH (ref 70–99)
Glucose-Capillary: 121 mg/dL — ABNORMAL HIGH (ref 70–99)
Glucose-Capillary: 58 mg/dL — ABNORMAL LOW (ref 70–99)
Glucose-Capillary: 63 mg/dL — ABNORMAL LOW (ref 70–99)
Glucose-Capillary: 68 mg/dL — ABNORMAL LOW (ref 70–99)
Glucose-Capillary: 75 mg/dL (ref 70–99)

## 2011-04-30 LAB — MAGNESIUM: Magnesium: 2 mg/dL (ref 1.5–2.5)

## 2011-04-30 LAB — CARDIAC PANEL(CRET KIN+CKTOT+MB+TROPI)
CK, MB: 2.4 ng/mL (ref 0.3–4.0)
Relative Index: 2.1 (ref 0.0–2.5)
Total CK: 117 U/L (ref 7–177)
Troponin I: <0.3 ng/mL (ref <0.30)

## 2011-04-30 LAB — BASIC METABOLIC PANEL
CO2: 26 mEq/L (ref 19–32)
Chloride: 94 mEq/L — ABNORMAL LOW (ref 96–112)
Creatinine, Ser: 6.03 mg/dL — ABNORMAL HIGH (ref 0.50–1.10)
Potassium: 4.3 mEq/L (ref 3.5–5.1)
Sodium: 133 mEq/L — ABNORMAL LOW (ref 135–145)

## 2011-04-30 LAB — CBC
HCT: 35 % — ABNORMAL LOW (ref 36.0–46.0)
MCHC: 32 g/dL (ref 30.0–36.0)
MCV: 81 fL (ref 78.0–100.0)
Platelets: 109 10*3/uL — ABNORMAL LOW (ref 150–400)
RDW: 17.5 % — ABNORMAL HIGH (ref 11.5–15.5)
WBC: 7.8 10*3/uL (ref 4.0–10.5)

## 2011-04-30 LAB — VALPROIC ACID LEVEL: Valproic Acid Lvl: 49.2 ug/mL — ABNORMAL LOW (ref 50.0–100.0)

## 2011-04-30 MED ORDER — DEXTROSE 10 % IV SOLN
INTRAVENOUS | Status: DC
  Administered 2011-04-30: 14:00:00 via INTRAVENOUS
  Filled 2011-04-30: qty 1000

## 2011-04-30 MED ORDER — PARICALCITOL 5 MCG/ML IV SOLN
7.0000 ug | INTRAVENOUS | Status: DC
  Administered 2011-05-01: 5 ug via INTRAVENOUS
  Administered 2011-05-03 (×2): 7 ug via INTRAVENOUS
  Filled 2011-04-30 (×3): qty 1.4

## 2011-04-30 MED ORDER — LORAZEPAM 2 MG/ML IJ SOLN
1.0000 mg | INTRAMUSCULAR | Status: DC | PRN
  Administered 2011-04-30 (×2): 1 mg via INTRAVENOUS
  Filled 2011-04-30: qty 1

## 2011-04-30 MED ORDER — LORAZEPAM 2 MG/ML IJ SOLN
1.0000 mg | Freq: Four times a day (QID) | INTRAMUSCULAR | Status: DC | PRN
  Administered 2011-05-01: 1 mg via INTRAVENOUS

## 2011-04-30 MED ORDER — ATORVASTATIN CALCIUM 10 MG PO TABS
10.0000 mg | ORAL_TABLET | Freq: Every day | ORAL | Status: DC
  Administered 2011-04-30 – 2011-05-03 (×4): 10 mg via ORAL
  Filled 2011-04-30 (×5): qty 1

## 2011-04-30 MED ORDER — DEXTROSE 50 % IV SOLN
25.0000 mL | Freq: Once | INTRAVENOUS | Status: AC | PRN
  Administered 2011-04-30: 25 mL via INTRAVENOUS

## 2011-04-30 MED ORDER — VALPROATE SODIUM 500 MG/5ML IV SOLN
750.0000 mg | Freq: Three times a day (TID) | INTRAVENOUS | Status: DC
  Filled 2011-04-30 (×2): qty 7.5

## 2011-04-30 MED ORDER — DEXTROSE 50 % IV SOLN
INTRAVENOUS | Status: AC
  Administered 2011-04-30: 50 mL
  Filled 2011-04-30: qty 50

## 2011-04-30 MED ORDER — DEXTROSE 50 % IV SOLN
1.0000 | Freq: Once | INTRAVENOUS | Status: AC
  Administered 2011-04-30: 50 mL via INTRAVENOUS

## 2011-04-30 MED ORDER — DEXTROSE 50 % IV SOLN
INTRAVENOUS | Status: AC
  Administered 2011-04-30: 25 mL
  Filled 2011-04-30: qty 50

## 2011-04-30 MED ORDER — LORAZEPAM 2 MG/ML IJ SOLN
INTRAMUSCULAR | Status: AC
  Administered 2011-04-30: 1 mg via INTRAVENOUS
  Filled 2011-04-30: qty 1

## 2011-04-30 MED ORDER — VALPROATE SODIUM 500 MG/5ML IV SOLN: 750.0000 mg | Freq: Once | INTRAVENOUS | Status: DC

## 2011-04-30 MED ORDER — VALPROATE SODIUM 500 MG/5ML IV SOLN
750.0000 mg | Freq: Three times a day (TID) | INTRAVENOUS | Status: DC
  Administered 2011-04-30 – 2011-05-01 (×3): 750 mg via INTRAVENOUS
  Filled 2011-04-30 (×5): qty 7.5

## 2011-04-30 MED ORDER — GLUCOSE 40 % PO GEL
ORAL | Status: AC
  Filled 2011-04-30: qty 1

## 2011-04-30 MED ORDER — VALPROATE SODIUM 500 MG/5ML IV SOLN
1000.0000 mg | Freq: Once | INTRAVENOUS | Status: AC
  Administered 2011-04-30: 1000 mg via INTRAVENOUS
  Filled 2011-04-30 (×2): qty 10

## 2011-04-30 NOTE — Progress Notes (Signed)
  Echocardiogram 2D Echocardiogram has been performed.  [name] 04/30/2011, 3:41 PM

## 2011-04-30 NOTE — Progress Notes (Signed)
CBG: 68  Treatment: D50 IV 25 mL  Symptoms: None  Follow-up CBG: Time:2130 CBG Result:121  Possible Reasons for Event: Unknown  Comments/MD notified:NP Lynch notified/Q4 hr monitoring ordered; will continue to monitor    Katrinka Blazing, [employer]
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
## 2011-04-30 NOTE — Progress Notes (Signed)
CBG: 58  Treatment: D50 IV 50 mL  Symptoms: None  Follow-up CBG: Time:1420 CBG Result:119  Possible Reasons for Event: Unknown  Comments/MD notified: Dr. Butler Denmark notified at time of event     Cherene Julian

## 2011-04-30 NOTE — Consult Note (Signed)
Subjective: Patient is stable. She had a seizure last night.   Objective: Vital signs in last 24 hours: Temp:  [97.9 F (36.6 C)-98.3 F (36.8 C)] 98 F (36.7 C) (04/28 0330) Pulse Rate:  [67-81] 72  (04/28 0800) Resp:  [13-22] 13  (04/28 0800) BP: (139-191)/(54-134) 139/54 mmHg (04/28 0330) SpO2:  [23 %-100 %] 98 % (04/28 0800) Weight:  [65.9 kg (145 lb 4.5 oz)-68.2 kg (150 lb 5.7 oz)] 65.9 kg (145 lb 4.5 oz) (04/28 0330)  Intake/Output from previous day: 04/27 0701 - 04/28 0700 In: 160 [I.V.:160] Out: -  Intake/Output this shift:   Nutritional status: NPO  Neurological exam: patient is drowsy, briefly arousable by sternal rub, will answer some simple questions and follow simple commands, no aphasia,  EOMI, PERRL, no face asymmetry, tongue midline, no drift, strength grossly 5/5 in all ext, sensory intact to LT throughout, F to N intact b/l, gait: deferred  Lab Results:  Basename 04/30/11 0415 04/30/11 0400 04/29/11 1540 04/29/11 1132 04/29/11 1112  WBC 7.8 -- 6.9 -- --  HGB 11.2* -- 10.6* -- --  HCT 35.0* -- 33.5* -- --  PLT 109* -- 126* -- --  NA -- 133* -- 139 --  K -- 4.3 -- 3.6 --  CL -- 94* -- 99 --  CO2 -- 26 -- -- 27  GLUCOSE -- 59* -- 116* --  BUN -- 22 -- 12 --  CREATININE -- 6.03* 5.04* -- --  CALCIUM -- 10.0 -- -- 9.4  LABA1C -- -- -- -- --   Studies/Results: Ct Head Wo Contrast  04/29/2011  *RADIOLOGY REPORT*  Clinical Data: Seizure during dialysis.  CPR post seizure.  CT HEAD WITHOUT CONTRAST  Technique:  Contiguous axial images were obtained from the base of the skull through the vertex without contrast.  Comparison: 02/22/2011.  Findings: Stable old left posterior watershed infarct, old left frontal infarct and old right cerebellar infarct.  Stable enlarged ventricles and subarachnoid spaces.  Better visualized old bilateral thalamic infarcts.  No intracranial hemorrhage, mass lesion or CT evidence of acute infarction.  Bilateral hyperostosis frontalis.   Atheromatous cavernous carotid artery calcifications. Minimal left sphenoid sinus mucosal thickening.  IMPRESSION:  1.  No acute abnormality. 2.  Atrophy, chronic small vessel white matter ischemic changes and old infarcts, as described above.  Original Report Authenticated By: Darrol Angel, M.D.   Dg Chest Port 1 View  04/29/2011  *RADIOLOGY REPORT*  Clinical Data: Unresponsive following CPR after a seizure.  PORTABLE CHEST - 1 VIEW  Comparison: 02/24/2011.  Findings: Enlarged cardiac silhouette with an interval increase in the size.  Interval ill-defined airspace opacity throughout the right lung and, to a lesser degree, in the left lung.  No visible pleural fluid.  No fracture or pneumothorax seen.  Thoracic spine degenerative changes.  IMPRESSION: Interval cardiomegaly and alveolar pulmonary edema and/or pulmonary contusions.  Original Report Authenticated By: Darrol Angel, M.D.   Medications: I have reviewed the patient's current medications.  Assessment/Plan: 66 years old woman with epilepsy who presented with seizures. She has a history of seizures, but was not treated. She was started on Depakote and her level was 49.1 this am. Increase Vaproic acid to 750 mg IV q8h. Will follow.  LOS: 1 day   [name]

## 2011-04-30 NOTE — Progress Notes (Signed)
CBG: 63  Treatment: D50 IV 25 mL  Symptoms: None  Follow-up CBG: Time:0850 CBG Result:113  Possible Reasons for Event: Unknown  Comments/MD notified:Dr. Rizwan notified at time of even.    Earlene Plater, Alphon[facility] [place]

## 2011-04-30 NOTE — Progress Notes (Signed)
   SUBJECTIVE: Stable, awake and alert. No CP   Filed Vitals:   04/29/11 2300 04/30/11 0030 04/30/11 0330 04/30/11 0800  BP: 182/63 155/71 139/54 187/81  Pulse: 69 78 69 72  Temp:   98 F (36.7 C) 98.6 F (37 C)  TempSrc:   Axillary Oral  Resp: 22  19 13   Height:      Weight:   145 lb 4.5 oz (65.9 kg)   SpO2: 94%  100% 98%    Intake/Output Summary (Last 24 hours) at 04/30/11 1017 Last data filed at 04/30/11 0900  Gross per 24 hour  Intake    180 ml  Output      0 ml  Net    180 ml    TELEMETRY: Reviewed telemetry pt in NSR  LABS: Basic Metabolic Panel:  Basename 04/30/11 0400 04/29/11 1540 04/29/11 1132 04/29/11 1112  NA 133* -- 139 --  K 4.3 -- 3.6 --  CL 94* -- 99 --  CO2 26 -- -- 27  GLUCOSE 59* -- 116* --  BUN 22 -- 12 --  CREATININE 6.03* 5.04* -- --  CALCIUM 10.0 -- -- 9.4  MG 2.0 -- -- --  PHOS -- -- -- --   Liver Function Tests:  Basename 04/29/11 1112  AST 37  ALT 26  ALKPHOS 99  BILITOT 0.8  PROT 6.7  ALBUMIN 3.4*   No results found for this basename: LIPASE:2,AMYLASE:2 in the last 72 hours CBC:  Basename 04/30/11 0415 04/29/11 1540 04/29/11 1112  WBC 7.8 6.9 --  NEUTROABS -- -- 4.4  HGB 11.2* 10.6* --  HCT 35.0* 33.5* --  MCV 81.0 80.3 --  PLT 109* 126* --   Cardiac Enzymes:  Basename 04/29/11 1540 04/29/11 1112  CKTOTAL 109 --  CKMB 2.3 --  CKMBINDEX -- --  TROPONINI <0.30 <0.30    PHYSICAL EXAM: GENERAL: well-nourished, well-developed; NAD HEENT: NCAT, PERRLA, EOMI; sclera clear; no xanthelasma NECK: palpable bilateral carotid pulses, no bruits; no JVD; no TM LUNGS: CTA bilaterally CARDIAC: RRR (S1, S2); no significant murmurs; no rubs or gallops ABDOMEN: soft, non-tender; intact BS EXTREMETIES: intact distal pulses; no significant peripheral edema SKIN: warm/dry; no obvious rash/lesions MUSCULOSKELETAL: no joint deformity NEURO: no focal deficit; NL affect  ASSESSMENT: 1  Cardiac arrest  - s/p Seizure  - NL cardiac  markers 2  NICM  - non obstructive CAD; EF 35-40%, 2/13 3  HTN 4  Epilepsy  PLAN: Will order 2D echo  Gene Serpe  History reviewed with the patient, no changes to be made.  I was able to talk with her and her daughter a little more today and she has had no new cardiac complaints recentlyThe patient exam reveals Agitated.  Lungs clear, COR systolic murmur unchanged.  All available labs, radiology testing, previous records reviewed. Agree with documented assessment and plan. I will check on the repeat echo but doubt that any further cardiac work up will be indicated.   Fayrene Fearing [place] Yeagley  11:19 AM 11/18/2010

## 2011-04-30 NOTE — Progress Notes (Signed)
Subjective:  Currently awake and alert, slow to answer questions, but accompanied by daughter; states that regular HD schedule is MWF, ran yesterday after missing Friday.   Objective: Vital signs in last 24 hours: Temp:  [97.9 F (36.6 C)-98.6 F (37 C)] 98.6 F (37 C) (04/28 0800) Pulse Rate:  [67-81] 72  (04/28 0800) Resp:  [13-22] 13  (04/28 0800) BP: (139-191)/(54-134) 187/81 mmHg (04/28 0800) SpO2:  [23 %-100 %] 98 % (04/28 0800) Weight:  [65.9 kg (145 lb 4.5 oz)-68.2 kg (150 lb 5.7 oz)] 65.9 kg (145 lb 4.5 oz) (04/28 0330) Weight change:   Intake/Output from previous day: 04/27 0701 - 04/28 0700 In: 160 [I.V.:160] Out: -  Intake/Output this shift: Total I/O In: 20 [I.V.:20] Out: -  EXAM: General appearance:  Alert, in no apparent distress Resp:  CTA bilaterally Cardio: RRR without murmur GI: + BS, soft and nontender Extremities:  No edema Access:  HeRO @ LUA  Lab Results:  Basename 04/30/11 0415 04/29/11 1540  WBC 7.8 6.9  HGB 11.2* 10.6*  HCT 35.0* 33.5*  PLT 109* 126*   BMET:  Basename 04/30/11 0400 04/29/11 1540 04/29/11 1132 04/29/11 1112  NA 133* -- 139 --  K 4.3 -- 3.6 --  CL 94* -- 99 --  CO2 26 -- -- 27  GLUCOSE 59* -- 116* --  BUN 22 -- 12 --  CREATININE 6.03* 5.04* -- --  CALCIUM 10.0 -- -- 9.4  ALBUMIN -- -- -- 3.4*   No results found for this basename: PTH:2 in the last 72 hours Iron Studies: No results found for this basename: IRON,TIBC,TRANSFERRIN,FERRITIN in the last 72 hours  Assessment/Plan: 1. Seizure activity - generalized tonic-clonic seizure during HD with unresponsiveness requiring CPR; history of epilepsy requiring medication, but recently off meds; CT of head with no acute abnormality; now on Valproic acid IV per neurology (allergic to Dilantin).  2. ESRD - HD on MWF @ [facility]; completed 3 of 4-hr HD yesterday, K stable at 4.3.  HD tomorrow. 3. Hypertension/volume - BP most recently 187/81, on Amlodipine 10 mg qd, Coreg 25 mg bid,  and Hydralazine 25 mg bid (outpatient meds); chest x-ray negative, current wt below EDW.. 4. Anemia - Hgb 10.6 off Epogen, but weekly IV Fe. 5. Metabolic bone disease - Ca 10 (10.5 corrected), P usually 4-5, on Zemplar 7 mcg per HD, Sensipar 30 mg qd, and Renvela 3 with meals and 2 with snacks. 6. Nutrition - last Alb 3.4, recently on Megace. 7. Cardiac arrest - after seizure yesterday; history of CAD s/p MI with stenting in 01/2009, followed by Dr. Antoine Poche.  2D echo pending. 8. Hypothyroidism - on Levothyroxine 50 mcg qd. 9. Depression - on Celexa. 10. Dementia/aphasia- daughter describes combination of word-finding problems (aphasia) and forgetfullness (leaving stove on, getting lost driving). They don't let her drive anymore.   11. DNR-  had out-of-hospital DNR prior to admission.    LOS: 1 day   LYLES,CHARLES 04/30/2011,11:05 AM  Patient seen and examined and agree with assessment and plan as above.  Vinson Moselle  MD [facility] [phone] pgr    [phone] cell 04/30/2011, 11:45 AM

## 2011-05-01 ENCOUNTER — Inpatient Hospital Stay (HOSPITAL_COMMUNITY): Payer: Medicare Other

## 2011-05-01 DIAGNOSIS — R569 Unspecified convulsions: Secondary | ICD-10-CM

## 2011-05-01 LAB — GLUCOSE, CAPILLARY
Glucose-Capillary: 132 mg/dL — ABNORMAL HIGH (ref 70–99)
Glucose-Capillary: 113 mg/dL — ABNORMAL HIGH (ref 70–99)
Glucose-Capillary: 118 mg/dL — ABNORMAL HIGH (ref 70–99)

## 2011-05-01 LAB — CBC
MCHC: 32.6 g/dL (ref 30.0–36.0)
Platelets: 111 10*3/uL — ABNORMAL LOW (ref 150–400)
RDW: 17.2 % — ABNORMAL HIGH (ref 11.5–15.5)

## 2011-05-01 LAB — RENAL FUNCTION PANEL
BUN: 33 mg/dL — ABNORMAL HIGH (ref 6–23)
CO2: 23 mEq/L (ref 19–32)
GFR calc Af Amer: 5 mL/min — ABNORMAL LOW (ref >90)
Glucose, Bld: 61 mg/dL — ABNORMAL LOW (ref 70–99)
Phosphorus: 5.7 mg/dL — ABNORMAL HIGH (ref 2.3–4.6)
Potassium: 4.2 mEq/L (ref 3.5–5.1)
Sodium: 134 mEq/L — ABNORMAL LOW (ref 135–145)

## 2011-05-01 LAB — VALPROIC ACID LEVEL: Valproic Acid Lvl: 110.3 ug/mL — ABNORMAL HIGH (ref 50.0–100.0)

## 2011-05-01 MED ORDER — VALPROATE SODIUM 500 MG/5ML IV SOLN
500.0000 mg | Freq: Three times a day (TID) | INTRAVENOUS | Status: DC
  Filled 2011-05-01 (×2): qty 5

## 2011-05-01 MED ORDER — PARICALCITOL 5 MCG/ML IV SOLN
INTRAVENOUS | Status: AC
  Administered 2011-05-01: 5 ug via INTRAVENOUS
  Filled 2011-05-01: qty 2

## 2011-05-01 MED ORDER — HYDRALAZINE HCL 25 MG PO TABS
25.0000 mg | ORAL_TABLET | Freq: Three times a day (TID) | ORAL | Status: DC
  Administered 2011-05-01 – 2011-05-04 (×9): 25 mg via ORAL
  Filled 2011-05-01 (×11): qty 1

## 2011-05-01 MED ORDER — BOOST / RESOURCE BREEZE PO LIQD
1.0000 | Freq: Three times a day (TID) | ORAL | Status: DC
  Administered 2011-05-01 – 2011-05-02 (×3): 1 via ORAL

## 2011-05-01 MED ORDER — VALPROATE SODIUM 500 MG/5ML IV SOLN
750.0000 mg | Freq: Two times a day (BID) | INTRAVENOUS | Status: DC
  Administered 2011-05-01: 750 mg via INTRAVENOUS
  Filled 2011-05-01 (×3): qty 7.5

## 2011-05-01 MED ORDER — LORAZEPAM 2 MG/ML IJ SOLN
INTRAMUSCULAR | Status: AC
  Filled 2011-05-01: qty 1

## 2011-05-01 NOTE — Progress Notes (Signed)
  Patient ID: [name], female   DOB: 1945/12/30, 66 y.o.   MRN: [id number]   SUBJECTIVE: going to dialysis.  Sleepy   Filed Vitals:   05/01/11 0329 05/01/11 0700 05/01/11 0725 05/01/11 0800  BP: 165/63 155/63 144/77 155/78  Pulse: 62 71 77 71  Temp:  97.9 F (36.6 C)    TempSrc:  Oral    Resp: 20 18 18 17   Height:      Weight: 65.3 kg (143 lb 15.4 oz)     SpO2: 100% 98% 98%     Intake/Output Summary (Last 24 hours) at 05/01/11 0833 Last data filed at 05/01/11 0500  Gross per 24 hour  Intake    850 ml  Output      0 ml  Net    850 ml    TELEMETRY: Reviewed telemetry pt in NSR no VT or long pauses  LABS: Basic Metabolic Panel:  Basename 05/01/11 0500 04/30/11 0400  NA 134* 133*  K 4.2 4.3  CL 96 94*  CO2 23 26  GLUCOSE 61* 59*  BUN 33* 22  CREATININE 7.92* 6.03*  CALCIUM 9.6 10.0  MG -- 2.0  PHOS 5.7* --   Liver Function Tests:  Basename 05/01/11 0500 04/29/11 1112  AST -- 37  ALT -- 26  ALKPHOS -- 99  BILITOT -- 0.8  PROT -- 6.7  ALBUMIN 2.7* 3.4*   No results found for this basename: LIPASE:2,AMYLASE:2 in the last 72 hours CBC:  Basename 05/01/11 0500 04/30/11 0415 04/29/11 1112  WBC 4.0 7.8 --  NEUTROABS -- -- 4.4  HGB 10.1* 11.2* --  HCT 31.0* 35.0* --  MCV 77.7* 81.0 --  PLT 111* 109* --   Cardiac Enzymes:  Basename 04/30/11 0948 04/29/11 1540 04/29/11 1112  CKTOTAL 117 109 --  CKMB 2.4 2.3 --  CKMBINDEX -- -- --  TROPONINI <0.30 <0.30 <0.30    PHYSICAL EXAM: GENERAL: well-nourished, well-developed; NAD HEENT: NCAT, PERRLA, EOMI; sclera clear; no xanthelasma NECK: palpable bilateral carotid pulses, no bruits; no JVD; no TM LUNGS: CTA bilaterally CARDIAC: RRR (S1, S2); no significant murmurs; no rubs or gallops ABDOMEN: soft, non-tender; intact BS EXTREMETIES: intact distal pulses; no significant peripheral edema SKIN: warm/dry; no obvious rash/lesions MUSCULOSKELETAL: no joint deformity NEURO: no focal deficit; NL  affect  ASSESSMENT: 1  Cardiac arrest  - s/p Seizure  - NL cardiac markers 2  NICM  - non obstructive CAD;  Echo shows EF 50-55% 3  HTN 4  Epilepsy  PLAN: Given age and comorbidities and lack of obvious cardiac etiology will not w/u further.  ? DNR status She is now on depakote for seizures

## 2011-05-01 NOTE — Progress Notes (Signed)
[facility] PROGRESS NOTE    SUBJECTIVE   Drowsy, states she is in no pain, able to follow simple commands at times but is confused at date and year.  no active seizures over night.   OBJECTIVE   Vital signs in last 24 hours: Temp:  [97.1 F (36.2 C)-98.8 F (37.1 C)] 97.1 F (36.2 C) (04/29 1119) Pulse Rate:  [60-78] 78  (04/29 1300) Resp:  [15-22] 22  (04/29 1300) BP: (131-184)/(61-93) 184/77 mmHg (04/29 1300) SpO2:  [92 %-100 %] 98 % (04/29 1300) Weight:  [62.8 kg (138 lb 7.2 oz)-66.1 kg (145 lb 11.6 oz)] 62.8 kg (138 lb 7.2 oz) (04/29 1119)  Intake/Output from previous day: 04/28 0701 - 04/29 0700 In: 860 [I.V.:860] Out: -  Intake/Output this shift: Total I/O In: -  Out: 3302 [Other:3302] Nutritional status: Clear Liquid  Past Medical History  Diagnosis Date  . CAD (coronary artery disease)     stent to RCA  . CVA (cerebral infarction) 2003    no apparent residual  . Renal failure     related to hypertension  . Hypothyroidism   . PVD (peripheral vascular disease)   . Hyperlipidemia   . Positive PPD     completed rifampin  . Diastolic congestive heart failure   . Renal insufficiency   . Hypertension   . COPD (chronic obstructive pulmonary disease) t  . Cancer     clear cell cancer, kidney  . Complication of anesthesia 12/2010    pt is very confused, with AMS with anesthesia    Neurologic Exam:  Mental Status: Alert, not oriented to year or place.  Speech fluent but at times able to answer me and at other times answers with "ok".  Intermittently able to follow 2 step commands.  Cranial Nerves: II-Visual fields grossly intact. III/IV/VI-Extraocular movements intact.  Pupils reactive bilaterally. V/VII-Smile symmetric VIII-grossly intact IX/X-normal gag XI-bilateral shoulder shrug XII-midline tongue extension Motor: 5/5 bilaterally upper extremity and antigravity bilateral LE.   Sensory: Withdrawls  all extremities from pain Deep Tendon Reflexes: 1+ and symmetric UE 2+ left KJ and 1+ right KJ no AJ Plantars: Downgoing left, up going on right Cerebellar: Normal finger-to-nose,  normal heel-to-shin test.    Lab Results: Lab Results  Component Value Date/Time   CHOL 159 02/22/2011  6:15 PM   Lipid Panel No results found for this basename: CHOL,TRIG,HDL,CHOLHDL,VLDL,LDLCALC in the last 72 hours  Studies/Results: Results for orders placed during the hospital encounter of 04/29/11 (from the past 24 hour(s))  GLUCOSE, CAPILLARY     Status: Abnormal   Collection Time   04/30/11  2:58 PM      Component Value Range   Glucose-Capillary 119 (*) 70 - 99 (mg/dL)  GLUCOSE, CAPILLARY     Status: Normal   Collection Time   04/30/11  4:07 PM      Component Value Range   Glucose-Capillary 98  70 - 99 (mg/dL)   Comment 1 Notify RN    GLUCOSE, CAPILLARY     Status: Normal   Collection Time   04/30/11  7:02 PM      Component Value Range   Glucose-Capillary 75  70 - 99 (mg/dL)   Comment 1 Notify RN    GLUCOSE, CAPILLARY  Status: Abnormal   Collection Time   04/30/11  8:07 PM      Component Value Range   Glucose-Capillary 68 (*) 70 - 99 (mg/dL)   Comment 1 Notify RN     Comment 2 Documented in Chart    GLUCOSE, CAPILLARY     Status: Abnormal   Collection Time   04/30/11  9:27 PM      Component Value Range   Glucose-Capillary 121 (*) 70 - 99 (mg/dL)  GLUCOSE, CAPILLARY     Status: Normal   Collection Time   04/30/11 11:33 PM      Component Value Range   Glucose-Capillary 75  70 - 99 (mg/dL)  GLUCOSE, CAPILLARY     Status: Normal   Collection Time   05/01/11 12:27 AM      Component Value Range   Glucose-Capillary 79  70 - 99 (mg/dL)  GLUCOSE, CAPILLARY     Status: Normal   Collection Time   05/01/11  3:27 AM      Component Value Range   Glucose-Capillary 81  70 - 99 (mg/dL)  RENAL FUNCTION PANEL     Status: Abnormal   Collection Time   05/01/11  5:00 AM      Component Value Range    Sodium 134 (*) 135 - 145 (mEq/L)   Potassium 4.2  3.5 - 5.1 (mEq/L)   Chloride 96  96 - 112 (mEq/L)   CO2 23  19 - 32 (mEq/L)   Glucose, Bld 61 (*) 70 - 99 (mg/dL)   BUN 33 (*) 6 - 23 (mg/dL)   Creatinine, Ser 7.82 (*) 0.50 - 1.10 (mg/dL)   Calcium 9.6  8.4 - 95.6 (mg/dL)   Phosphorus 5.7 (*) 2.3 - 4.6 (mg/dL)   Albumin 2.7 (*) 3.5 - 5.2 (g/dL)   GFR calc non Af Amer 5 (*) >90 (mL/min)   GFR calc Af Amer 5 (*) >90 (mL/min)  CBC     Status: Abnormal   Collection Time   05/01/11  5:00 AM      Component Value Range   WBC 4.0  4.0 - 10.5 (K/uL)   RBC 3.99  3.87 - 5.11 (MIL/uL)   Hemoglobin 10.1 (*) 12.0 - 15.0 (g/dL)   HCT 21.3 (*) 08.6 - 46.0 (%)   MCV 77.7 (*) 78.0 - 100.0 (fL)   MCH 25.3 (*) 26.0 - 34.0 (pg)   MCHC 32.6  30.0 - 36.0 (g/dL)   RDW 57.8 (*) 46.9 - 15.5 (%)   Platelets 111 (*) 150 - 400 (K/uL)  VALPROIC ACID LEVEL     Status: Abnormal   Collection Time   05/01/11  5:00 AM      Component Value Range   Valproic Acid Lvl 110.3 (*) 50.0 - 100.0 (ug/mL)  GLUCOSE, CAPILLARY     Status: Abnormal   Collection Time   05/01/11  1:07 PM      Component Value Range   Glucose-Capillary 61 (*) 70 - 99 (mg/dL)   Comment 1 Notify RN      Medications:     Prior to Admission:  Prescriptions prior to admission  Medication Sig Dispense Refill  . albuterol-ipratropium (COMBIVENT) 18-103 MCG/ACT inhaler Inhale 2 puffs into the lungs every 6 (six) hours as needed. For breathing      . amLODipine (NORVASC) 10 MG tablet Take 10 mg by mouth daily.       [name] aspirin EC 81 MG tablet Take 81 mg by mouth daily.       [name]  buPROPion (WELLBUTRIN SR) 150 MG 12 hr tablet Take 150 mg by mouth 2 (two) times daily.      . carvedilol (COREG) 25 MG tablet Take 25 mg by mouth 2 (two) times daily with a meal.       . citalopram (CELEXA) 20 MG tablet Take 20 mg by mouth daily.       [name] DEXILANT 60 MG capsule Take 60 mg by mouth daily.       . Fluticasone-Salmeterol (ADVAIR) 100-50 MCG/DOSE AEPB Inhale 1  puff into the lungs every 12 (twelve) hours.      . hydrALAZINE (APRESOLINE) 25 MG tablet Take 25 mg by mouth 2 (two) times daily.       [name] levothyroxine (SYNTHROID, LEVOTHROID) 88 MCG tablet Take 88 mcg by mouth daily.       . multivitamin (RENA-VIT) TABS tablet Take 1 tablet by mouth daily.       [name] NITROSTAT 0.4 MG SL tablet Place 0.4 mg under the tongue every 5 (five) minutes as needed. For chest pain.      . polyethylene glycol powder (GLYCOLAX/MIRALAX) powder Take 17 g by mouth daily as needed. For constipation.      . pravastatin (PRAVACHOL) 40 MG tablet Take 80 mg by mouth at bedtime.       . sevelamer (RENVELA) 800 MG tablet Take 1,600 mg by mouth 3 (three) times daily with meals.       . traMADol (ULTRAM) 50 MG tablet Take 50 mg by mouth every 6 (six) hours as needed. For pain      . traZODone (DESYREL) 50 MG tablet Take 50-100 mg by mouth at bedtime.       Scheduled:   . amLODipine  10 mg Oral Daily  . aspirin EC  81 mg Oral Daily  . atorvastatin  10 mg Oral q1800  . carvedilol  25 mg Oral BID WC  . citalopram  20 mg Oral Daily  . dextrose  1 ampule Intravenous Once  . dextrose      . dextrose      . Fluticasone-Salmeterol  1 puff Inhalation Q12H  . hydrALAZINE  25 mg Oral BID  . levothyroxine  88 mcg Oral Daily  . LORazepam      . multivitamin  1 tablet Oral Daily  . pantoprazole  40 mg Oral Q1200  . paricalcitol  7 mcg Intravenous 3 times weekly  . sevelamer  1,600 mg Oral TID WC  . sodium chloride  3 mL Intravenous Q12H  . valproate sodium  1,000 mg Intravenous Once  . valproate sodium  750 mg Intravenous Q12H  . DISCONTD: valproate sodium  500 mg Intravenous Q8H  . DISCONTD: valproate sodium  750 mg Intravenous Q8H    Assessment/Plan:    Patient Active Hospital Problem List: Seizure (04/29/2011)   Assessment: no further seizures over night.  Depakote level is now 110.0.     Plan: will hold 1400 hour dose and then restart at 750 mg BID with level at 0500 tomorrow  AM     Felicie Morn PA-C [facility] [phone]  05/01/2011, 1:49 PM

## 2011-05-01 NOTE — Procedures (Signed)
I was present at this session.  I have reviewed the session itself and made appropriate changes.  Taj Nevins K. 4/29/20138:43 AM

## 2011-05-01 NOTE — Progress Notes (Signed)
Pt transported to dialysis; no complications

## 2011-05-01 NOTE — Progress Notes (Addendum)
INITIAL ADULT NUTRITION ASSESSMENT Date: 05/01/2011   Time: 3:20 PM  Reason for Assessment: Nutrition Risk report (unintentional weight loss)  ASSESSMENT: Female 66 y.o.  Dx: Seizure  Hx:  Past Medical History  Diagnosis Date  . CAD (coronary artery disease)     stent to RCA  . CVA (cerebral infarction) 2003    no apparent residual  . Renal failure     related to hypertension  . Hypothyroidism   . PVD (peripheral vascular disease)   . Hyperlipidemia   . Positive PPD     completed rifampin  . Diastolic congestive heart failure   . Renal insufficiency   . Hypertension   . COPD (chronic obstructive pulmonary disease) t  . Cancer     clear cell cancer, kidney  . Complication of anesthesia 12/2010    pt is very confused, with AMS with anesthesia    Related Meds:  Scheduled Meds:    . amLODipine  10 mg Oral Daily  . aspirin EC  81 mg Oral Daily  . atorvastatin  10 mg Oral q1800  . carvedilol  25 mg Oral BID WC  . citalopram  20 mg Oral Daily  . dextrose      . Fluticasone-Salmeterol  1 puff Inhalation Q12H  . hydrALAZINE  25 mg Oral BID  . levothyroxine  88 mcg Oral Daily  . LORazepam      . multivitamin  1 tablet Oral Daily  . pantoprazole  40 mg Oral Q1200  . paricalcitol  7 mcg Intravenous 3 times weekly  . sevelamer  1,600 mg Oral TID WC  . sodium chloride  3 mL Intravenous Q12H  . valproate sodium  750 mg Intravenous Q12H  . DISCONTD: valproate sodium  500 mg Intravenous Q8H  . DISCONTD: valproate sodium  750 mg Intravenous Q8H   Continuous Infusions:    . dextrose 50 mL/hr at 04/30/11 1900   PRN Meds:.acetaminophen, acetaminophen, albuterol, hydrALAZINE, LORazepam, nitroGLYCERIN, ondansetron (ZOFRAN) IV, ondansetron, polyethylene glycol   Ht: 5\' 5"  (165.1 cm)  Wt: 138 lb 7.2 oz (62.8 kg)  Ideal Wt: 56.8 kg % Ideal Wt: 111%  Wt Readings from Last 15 Encounters:  05/01/11 138 lb 7.2 oz (62.8 kg)  02/27/11 148 lb 13 oz (67.5 kg)  02/27/11 148 lb  13 oz (67.5 kg)  12/23/10 165 lb 9.1 oz (75.1 kg)  11/29/10 173 lb (78.472 kg)  11/16/09 168 lb (76.204 kg)  05/11/09 194 lb (87.998 kg)  10/28/08 188 lb (85.276 kg)  07/22/08 193 lb (87.544 kg)    Usual Wt: 168 lb (4 months ago) % Usual Wt: 82%  Body mass index is 23.04 kg/(m^2).  Food/Nutrition Related Hx: 18% weight loss in the past 4 months  Labs:  CMP     Component Value Date/Time   NA 134* 05/01/2011 0500   K 4.2 05/01/2011 0500   CL 96 05/01/2011 0500   CO2 23 05/01/2011 0500   GLUCOSE 61* 05/01/2011 0500   BUN 33* 05/01/2011 0500   CREATININE 7.92* 05/01/2011 0500   CALCIUM 9.6 05/01/2011 0500   PROT 6.7 04/29/2011 1112   ALBUMIN 2.7* 05/01/2011 0500   AST 37 04/29/2011 1112   ALT 26 04/29/2011 1112   ALKPHOS 99 04/29/2011 1112   BILITOT 0.8 04/29/2011 1112   GFRNONAA 5* 05/01/2011 0500   GFRAA 5* 05/01/2011 0500  Phosphorus 5.7 (high)  CBG (last 3)   Basename 05/01/11 1307 05/01/11 0327 05/01/11 0027  GLUCAP 61* 81 79  Intake/Output Summary (Last 24 hours) at 05/01/11 1520 Last data filed at 05/01/11 1119  Gross per 24 hour  Intake    700 ml  Output   3302 ml  Net  -2602 ml    Diet Order: Clear Liquid  IVF:     dextrose Last Rate: 50 mL/hr at 04/30/11 1900    Estimated Nutritional Needs:   Kcal: 1700-1900 Protein: 80-95 grams Fluid: 1200 ml  Noted patient has recently been receiving Megace for appetite stimulant.  Patient not alert enough to provide any nutrition history.  Patient with non-severe (moderate) malnutrition in the context of chronic illness, given 18% weight loss in the past 4 months, visible mild loss of body fat and muscle mass (sunken face/prominent cheek bones and small ankles with prominent bones); suspect intake PTA was < 75% for > one month with recent requirement for Megace.  NUTRITION DIAGNOSIS: -Inadequate oral intake (NI-2.1).  Status: Ongoing  RELATED TO: decreased level of alertness  AS EVIDENCE BY: clear liquid diet and  18% weight loss over the past 4 months  MONITORING/EVALUATION(Goals):  Goal:  Intake to meet >90% of estimated nutrition needs.  Monitor:  PO intake, weight trend, labs.  EDUCATION NEEDS: -Education not appropriate at this time  INTERVENTION:  Advance diet as tolerated (per MD) to regular diet; recommend no renal restrictions until good PO intake is established.  Resource Breeze PO TID to maximize oral intake.  Dietitian #:  [phone]  DOCUMENTATION CODES Per approved criteria  -Non-severe (moderate) malnutrition in the context of chronic illness    [name] 05/01/2011, 3:20 PM

## 2011-05-01 NOTE — Progress Notes (Signed)
Utilization review completed.  

## 2011-05-01 NOTE — Progress Notes (Signed)
[facility] [place] TEAM 1 - Stepdown/ICU TEAM  PCP:  [name], MD  Subjective: 66 year old female with a past medical history of end-stage renal disease on hemodialysis, prior history of seizures - apparently all antiseizure medication was stopped approximately a year ago, recent history of takatsubo cardiomyopathy, history of coronary artery disease, history of CVA who was brought to the ED from her hemodialysis center after she suffered a "cardiac arrest" following a generalized tonic-clonic seizure that was witnessed by the nursing staff there.  It is unknown what the initial rhythm was, however from the nursing documentation in the ED she was never defibrillated.  At the time of my exam today she is quite lethargic.  She will answer only short questions, and quickly goes back to sleep.  No family is present to provide further history.    Objective: Weight change: -2.9 kg (-6 lb 6.3 oz)  Intake/Output Summary (Last 24 hours) at 05/01/11 1513 Last data filed at 05/01/11 1119  Gross per 24 hour  Intake    700 ml  Output   3302 ml  Net  -2602 ml   Blood pressure 184/77, pulse 78, temperature 97.1 F (36.2 C), temperature source Oral, resp. rate 22, height 5\' 5"  (1.651 m), weight 62.8 kg (138 lb 7.2 oz), SpO2 98.00%.  CBG (last 3)   Basename 05/01/11 1307 05/01/11 0327 05/01/11 0027  GLUCAP 61* 81 79   Physical Exam: General: No acute respiratory distress - markedly somnolent Lungs: Clear to auscultation bilaterally without wheezes or crackles Cardiovascular: Regular rate and rhythm without murmur gallop or rub normal S1 and S2 Abdomen: Nontender, nondistended, soft, bowel sounds positive, no rebound, no ascites, no appreciable mass Extremities: No significant cyanosis, clubbing, or edema bilateral lower extremities  Lab Results:  Basename 05/01/11 0500 04/30/11 0400 04/29/11 1540 04/29/11 1132 04/29/11 1112  NA 134* 133* -- 139 --  K 4.2 4.3 -- 3.6 --  CL 96  94* -- 99 --  CO2 23 26 -- -- 27  GLUCOSE 61* 59* -- 116* --  BUN 33* 22 -- 12 --  CREATININE 7.92* 6.03* 5.04* -- --  CALCIUM 9.6 10.0 -- -- 9.4  MG -- 2.0 -- -- --  PHOS 5.7* -- -- -- --    Basename 05/01/11 0500 04/29/11 1112  AST -- 37  ALT -- 26  ALKPHOS -- 99  BILITOT -- 0.8  PROT -- 6.7  ALBUMIN 2.7* 3.4*    Basename 05/01/11 0500 04/30/11 0415 04/29/11 1540 04/29/11 1112  WBC 4.0 7.8 6.9 --  NEUTROABS -- -- -- 4.4  HGB 10.1* 11.2* 10.6* --  HCT 31.0* 35.0* 33.5* --  MCV 77.7* 81.0 80.3 --  PLT 111* 109* 126* --    Basename 04/30/11 0948 04/29/11 1540 04/29/11 1112  CKTOTAL 117 109 --  CKMB 2.4 2.3 --  CKMBINDEX -- -- --  TROPONINI <0.30 <0.30 <0.30   Micro Results: Recent Results (from the past 240 hour(s))  MRSA PCR SCREENING     Status: Normal   Collection Time   04/29/11  3:39 PM      Component Value Range Status Comment   MRSA by PCR NEGATIVE  NEGATIVE  Final     Studies/Results: All recent x-ray/radiology reports have been reviewed in detail.   Medications: I have reviewed the patient's complete medication list.  Assessment/Plan:  Seizure - Primary Event Generalized tonic-clonic type - Neuro has seen the pt - see Neuro note for recs  "Cardiac arrest" Suspect this  was simply sedation and possible hypoventilation related to seizure - see Cards note  Pulmonary edema Much improved at this time w/ ongoing HD   Altered mental status/acute toxic metabolic encephalopathy CT of head was w/o acute findings - suspect this is simply a protracted post-ictal state - follow clinically  Recurrent hypoglycemia Is on D10 at present, and yet CBG remains borderline - does not appear to be on any potentially offending meds - will follow trend - if CBG continues to fluctuate, may require eval for insulinoma  Known hx of CAD Stent to RCA  ESRD on HD Nephrology is following   Hx of clear cell kidney CA  HTN Poorly controlled - adjust meds and  follow  COPD Well compensated at present  DNR  Lonia Blood, MD [facility] Office  [phone] Pager [phone]  On-Call/Text Page:      [email or web address]      password [facility]

## 2011-05-01 NOTE — Progress Notes (Signed)
Patient ID: [name], female   DOB: 02-16-1945, 66 y.o.   MRN: [id number]   [facility] Progress Note    Subjective:   No seizure events reported overnight, no acute events.    Objective:   BP 155/84  Pulse 71  Temp(Src) 97.9 F (36.6 C) (Oral)  Resp 15  Ht 5\' 5"  (1.651 m)  Wt 65.3 kg (143 lb 15.4 oz)  BMI 23.96 kg/m2  SpO2 98%  Physical Exam: Gen: Comfortable in dialysis, somnolent but easy to arouse CVS: Pulse regular in rate and rhythm, heart sounds S1 and S2 normal without any murmurs rubs or gallops Resp: Coarse breath sounds bilaterally-no rales/rhonchi Abd: Soft, flat, nontender and bowel sounds are normal Ext: No lower extremity edema is appreciated  Labs: BMET  Lab 05/01/11 0500 04/30/11 0400 04/29/11 1540 04/29/11 1132 04/29/11 1112  NA 134* 133* -- 139 137  K 4.2 4.3 -- 3.6 3.7  CL 96 94* -- 99 97  CO2 23 26 -- -- 27  GLUCOSE 61* 59* -- 116* 112*  BUN 33* 22 -- 12 13  CREATININE 7.92* 6.03* 5.04* 4.50* 4.41*  ALB -- -- -- -- --  CALCIUM 9.6 10.0 -- -- 9.4  PHOS 5.7* -- -- -- --   CBC  Lab 05/01/11 0500 04/30/11 0415 04/29/11 1540 04/29/11 1132 04/29/11 1112  WBC 4.0 7.8 6.9 -- 6.3  NEUTROABS -- -- -- -- 4.4  HGB 10.1* 11.2* 10.6* 13.6 --  HCT 31.0* 35.0* 33.5* 40.0 --  MCV 77.7* 81.0 80.3 -- 80.3  PLT 111* 109* 126* -- 127*    @IMGRELPRIORS @ Medications:      . amLODipine  10 mg Oral Daily  . aspirin EC  81 mg Oral Daily  . atorvastatin  10 mg Oral q1800  . carvedilol  25 mg Oral BID WC  . citalopram  20 mg Oral Daily  . dextrose  1 ampule Intravenous Once  . dextrose      . dextrose      . Fluticasone-Salmeterol  1 puff Inhalation Q12H  . hydrALAZINE  25 mg Oral BID  . levothyroxine  88 mcg Oral Daily  . LORazepam      . multivitamin  1 tablet Oral Daily  . pantoprazole  40 mg Oral Q1200  . paricalcitol      . paricalcitol  7 mcg Intravenous 3 times weekly  . sevelamer  1,600 mg Oral TID WC  . sodium chloride  3  mL Intravenous Q12H  . valproate sodium  1,000 mg Intravenous Once  . valproate sodium  750 mg Intravenous Q8H  . DISCONTD: valproate sodium  500 mg Intravenous Q8H  . DISCONTD: valproate sodium  750 mg Intravenous Q8H  . DISCONTD: valproate sodium  750 mg Intravenous Once     Assessment/ Plan:   1. Seizure activity - generalized tonic-clonic seizure during HD with unresponsiveness requiring CPR; history of epilepsy requiring medication, but recently off meds; CT of head with no acute abnormality; now on Valproic acid IV per neurology (allergic to Dilantin). Seizure-free since admission with occasional episodes of agitation 2. ESRD - HD on MWF @ [facility]; Tolerating HD without problems at this time. 3. Hypertension/volume - monitor blood pressures postdialysis-no intradialytic hypotension noted so far. Remains on hydralazine, amlodipine and carvedilol. 4. Anemia - hemoglobin currently at goal, remains off Epogen, currently off iron. 5. Metabolic bone disease - hyperphosphatemic, we'll restart binders when mentally more aware and able to tolerate the same. Anticipate some improvement  status post dialysis. 6. Nutrition - last Alb 3.4, recently on Megace. 7. Cardiac arrest - after seizure yesterday; history of CAD s/p MI with stenting in 01/2009, followed by Dr. Antoine Poche. 2D echo pending. 8. Hypothyroidism - on Levothyroxine 50 mcg qd. 9. Depression - on Celexa. 10. Dementia/aphasia- daughter describes combination of word-finding problems (aphasia) and forgetfullness (leaving stove on, getting lost driving). They don't let her drive anymore.  11. DNR- had out-of-hospital DNR prior to admission.    Zetta Bills, MD 05/01/2011, 9:06 AM

## 2011-05-02 DIAGNOSIS — N186 End stage renal disease: Secondary | ICD-10-CM | POA: Diagnosis not present

## 2011-05-02 LAB — GLUCOSE, CAPILLARY
Glucose-Capillary: 134 mg/dL — ABNORMAL HIGH (ref 70–99)
Glucose-Capillary: 113 mg/dL — ABNORMAL HIGH (ref 70–99)

## 2011-05-02 LAB — BASIC METABOLIC PANEL
BUN: 19 mg/dL (ref 6–23)
CO2: 25 mEq/L (ref 19–32)
Calcium: 10.1 mg/dL (ref 8.4–10.5)
Chloride: 84 mEq/L — ABNORMAL LOW (ref 96–112)
GFR calc Af Amer: 10 mL/min — ABNORMAL LOW (ref >90)
GFR calc Af Amer: 9 mL/min — ABNORMAL LOW (ref >90)
GFR calc non Af Amer: 8 mL/min — ABNORMAL LOW (ref >90)
Potassium: 3.8 mEq/L (ref 3.5–5.1)
Sodium: 120 mEq/L — ABNORMAL LOW (ref 135–145)
Sodium: 133 mEq/L — ABNORMAL LOW (ref 135–145)

## 2011-05-02 MED ORDER — SODIUM CHLORIDE 0.9 % IV SOLN: INTRAVENOUS | Status: DC

## 2011-05-02 MED ORDER — SODIUM CHLORIDE 0.9 % IV SOLN
INTRAVENOUS | Status: DC
  Filled 2011-05-02: qty 1

## 2011-05-02 MED ORDER — DIVALPROEX SODIUM 500 MG PO DR TAB
750.0000 mg | DELAYED_RELEASE_TABLET | Freq: Two times a day (BID) | ORAL | Status: DC
  Filled 2011-05-02 (×2): qty 1

## 2011-05-02 MED ORDER — DIVALPROEX SODIUM 500 MG PO DR TAB
750.0000 mg | DELAYED_RELEASE_TABLET | Freq: Two times a day (BID) | ORAL | Status: DC
  Administered 2011-05-02 – 2011-05-04 (×4): 750 mg via ORAL
  Filled 2011-05-02 (×5): qty 1

## 2011-05-02 MED ORDER — POTASSIUM CHLORIDE 10 MEQ/100ML IV SOLN: 10.0000 meq | INTRAVENOUS | Status: DC

## 2011-05-02 MED ORDER — DEXTROSE 50 % IV SOLN: 25.0000 mL | INTRAVENOUS | Status: DC | PRN

## 2011-05-02 MED ORDER — VALPROATE SODIUM 500 MG/5ML IV SOLN
750.0000 mg | Freq: Once | INTRAVENOUS | Status: AC
  Administered 2011-05-02: 750 mg via INTRAVENOUS
  Filled 2011-05-02: qty 7.5

## 2011-05-02 MED ORDER — DEXTROSE 10 % IV SOLN: INTRAVENOUS | Status: DC

## 2011-05-02 NOTE — Progress Notes (Signed)
[facility] PROGRESS NOTE    SUBJECTIVE   No further seizures, more alert and awake.   OBJECTIVE   Vital signs in last 24 hours: Temp:  [97.1 F (36.2 C)-98.3 F (36.8 C)] 98 F (36.7 C) (04/30 0800) Pulse Rate:  [58-78] 61  (04/30 0408) Resp:  [14-22] 15  (04/30 0408) BP: (128-184)/(50-93) 156/54 mmHg (04/30 0800) SpO2:  [92 %-100 %] 98 % (04/30 0806) Weight:  [62.8 kg (138 lb 7.2 oz)-64.4 kg (141 lb 15.6 oz)] 64.4 kg (141 lb 15.6 oz) (04/30 0500)  Intake/Output from previous day: 04/29 0701 - 04/30 0700 In: 1203 [P.O.:600; I.V.:603] Out: 3452 [Urine:150] Intake/Output this shift:   Nutritional status: Clear Liquid  Past Medical History  Diagnosis Date  . CAD (coronary artery disease)     stent to RCA  . CVA (cerebral infarction) 2003    no apparent residual  . Renal failure     related to hypertension  . Hypothyroidism   . PVD (peripheral vascular disease)   . Hyperlipidemia   . Positive PPD     completed rifampin  . Diastolic congestive heart failure   . Renal insufficiency   . Hypertension   . COPD (chronic obstructive pulmonary disease) t  . Cancer     clear cell cancer, kidney  . Complication of anesthesia 12/2010    pt is very confused, with AMS with anesthesia    Neurologic Exam:   Mental Status:  Alert, oriented to hospital, [place]y [place], date, month and her daughter who is next to her Cranial Nerves:  II-Visual fields grossly intact.  III/IV/VI-Extraocular movements intact. Pupils reactive bilaterally.  V/VII-Smile symmetric  VIII-grossly intact  IX/X-normal gag  XI-bilateral shoulder shrug  XII-midline tongue extension  Motor: 5/5 bilaterally upper extremity and 5/5 bilateral LE.  Sensory: Intact pp/lt bilaterally Deep Tendon Reflexes: 1+ and symmetric UE 2+ left KJ and 1+ right KJ no AJ  Plantars: Downgoing left,down going on right  Cerebellar: Normal finger-to-nose, normal heel-to-shin  test.   Lab Results: Results for orders placed during the hospital encounter of 04/29/11 (from the past 24 hour(s))  GLUCOSE, CAPILLARY     Status: Abnormal   Collection Time   05/01/11  1:07 PM      Component Value Range   Glucose-Capillary 61 (*) 70 - 99 (mg/dL)   Comment 1 Notify RN    GLUCOSE, CAPILLARY     Status: Abnormal   Collection Time   05/01/11  5:00 PM      Component Value Range   Glucose-Capillary 132 (*) 70 - 99 (mg/dL)   Comment 1 Notify RN    GLUCOSE, CAPILLARY     Status: Abnormal   Collection Time   05/01/11  8:07 PM      Component Value Range   Glucose-Capillary 113 (*) 70 - 99 (mg/dL)  GLUCOSE, CAPILLARY     Status: Abnormal   Collection Time   05/01/11 11:28 PM      Component Value Range   Glucose-Capillary 118 (*) 70 - 99 (mg/dL)  GLUCOSE, CAPILLARY     Status: Abnormal   Collection Time   05/02/11  3:37 AM      Component Value Range   Glucose-Capillary 108 (*) 70 - 99 (mg/dL)  VALPROIC ACID LEVEL  Status: Normal   Collection Time   05/02/11  4:23 AM      Component Value Range   Valproic Acid Lvl 58.1  50.0 - 100.0 (ug/mL)  BASIC METABOLIC PANEL     Status: Abnormal   Collection Time   05/02/11  4:23 AM      Component Value Range   Sodium 120 (*) 135 - 145 (mEq/L)   Potassium 3.6  3.5 - 5.1 (mEq/L)   Chloride 84 (*) 96 - 112 (mEq/L)   CO2 25  19 - 32 (mEq/L)   Glucose, Bld 875 (*) 70 - 99 (mg/dL)   BUN 16  6 - 23 (mg/dL)   Creatinine, Ser 8.29 (*) 0.50 - 1.10 (mg/dL)   Calcium 9.0  8.4 - 56.2 (mg/dL)   GFR calc non Af Amer 9 (*) >90 (mL/min)   GFR calc Af Amer 10 (*) >90 (mL/min)  GLUCOSE, CAPILLARY     Status: Normal   Collection Time   05/02/11  5:52 AM      Component Value Range   Glucose-Capillary 98  70 - 99 (mg/dL)  BASIC METABOLIC PANEL     Status: Abnormal   Collection Time   05/02/11  6:50 AM      Component Value Range   Sodium 133 (*) 135 - 145 (mEq/L)   Potassium 3.8  3.5 - 5.1 (mEq/L)   Chloride 95 (*) 96 - 112 (mEq/L)   CO2  28  19 - 32 (mEq/L)   Glucose, Bld 96  70 - 99 (mg/dL)   BUN 19  6 - 23 (mg/dL)   Creatinine, Ser 1.30 (*) 0.50 - 1.10 (mg/dL)   Calcium 86.5  8.4 - 10.5 (mg/dL)   GFR calc non Af Amer 8 (*) >90 (mL/min)   GFR calc Af Amer 9 (*) >90 (mL/min)  GLUCOSE, CAPILLARY     Status: Abnormal   Collection Time   05/02/11  8:13 AM      Component Value Range   Glucose-Capillary 109 (*) 70 - 99 (mg/dL)   Comment 1 Notify RN     Comment 2 Documented in Chart     Lipid Panel No results found for this basename: CHOL,TRIG,HDL,CHOLHDL,VLDL,LDLCALC in the last 72 hours  Studies/Results: No results found.  Medications:     Scheduled:   . amLODipine  10 mg Oral Daily  . aspirin EC  81 mg Oral Daily  . atorvastatin  10 mg Oral q1800  . carvedilol  25 mg Oral BID WC  . citalopram  20 mg Oral Daily  . feeding supplement  1 Container Oral TID BM  . Fluticasone-Salmeterol  1 puff Inhalation Q12H  . hydrALAZINE  25 mg Oral Q8H  . levothyroxine  88 mcg Oral Daily  . LORazepam      . multivitamin  1 tablet Oral Daily  . pantoprazole  40 mg Oral Q1200  . paricalcitol  7 mcg Intravenous 3 times weekly  . sevelamer  1,600 mg Oral TID WC  . sodium chloride  3 mL Intravenous Q12H  . valproate sodium  750 mg Intravenous Q12H  . DISCONTD: hydrALAZINE  25 mg Oral BID  . DISCONTD: potassium chloride  10 mEq Intravenous Q1H  . DISCONTD: valproate sodium  500 mg Intravenous Q8H  . DISCONTD: valproate sodium  750 mg Intravenous Q8H    Assessment/Plan:   Patient Active Hospital Problem List: Seizure (04/29/2011) Assessment: no further seizures over night. Depakote level is now 58.1 and therapeutic.  Plan: continue  at 750 mg BID PO.  Patient will need to follow up with Dr. Anne Hahn out patient (primary neurologist).   Neurology will S/O   Felicie Morn PA-C [facility] [phone]  05/02/2011, 10:21 AM

## 2011-05-02 NOTE — Progress Notes (Deleted)
A.M. Labs reviewed. Gluc 875, Na 120, creat elevated. D10 IV d/c'd and insulin gtt started with 500cc saline bolus followed by 50cc/hr maintenance. Recheck bmp at 1000.  Maren Reamer, NP [facility]

## 2011-05-02 NOTE — Progress Notes (Addendum)
CRITICAL VALUE ALERT  Critical value received:  Glucose 875  Date of notification:  05/02/2011   Time of notification:  0600  Critical value read back:yes  Nurse who received alert:  [place] Daulton Harbaugh, RN   MD notified (1st page):  Craige Cotta, NP    Time of first page:  0615    MD notified (2nd page):  Time of second page:  Responding MD:  Craige Cotta, NP  Time MD responded:  [phone]  Labs drawn near D10 infusion. Orders to redraw labs away from infusion. Will continue to monitor.   [place] Neyla Gauntt, RN

## 2011-05-02 NOTE — Progress Notes (Signed)
RN called after previous note written and orders placed to say the lab tech drew the BMP near the D10 line, so that is likely why the glucose was so high. CBGs during night have been in the low 100s.  Orders d/c'd and lab asked to come redraw stat BMP away from D10 line to verify results. Further orders after that.  Maren Reamer, NP [facility]

## 2011-05-02 NOTE — Progress Notes (Signed)
05/02/2011 0215  Pt had a 7 beat run of V tach. Now reading SR on the monitor. Craige Cotta, NP notified. No new orders received. Will continue to monitor.  [place] Liandro Thelin, RN

## 2011-05-02 NOTE — Progress Notes (Signed)
Pt to be TX to [id number], VSS, called report.

## 2011-05-02 NOTE — Progress Notes (Addendum)
[facility] TEAM 1 - Stepdown/ICU TEAM  PCP:  [name], MD  Subjective: 66 year old female with a past medical history of end-stage renal disease on hemodialysis, prior history of seizures - apparently all antiseizure medication was stopped approximately a year ago, recent history of takatsubo cardiomyopathy, history of coronary artery disease, history of CVA who was brought to the ED from her hemodialysis center after she suffered a "cardiac arrest" following a generalized tonic-clonic seizure that was witnessed by the nursing staff there.  It is unknown what the initial rhythm was, however from the nursing documentation in the ED she was never defibrillated.  At the time of my exam today she is quite lethargic.  She will answer only short questions, and quickly goes back to sleep.  No family is present to provide further history.    Objective: Weight change: 0.8 kg (1 lb 12.2 oz)  Intake/Output Summary (Last 24 hours) at 05/02/11 1612 Last data filed at 05/02/11 0600  Gross per 24 hour  Intake   1203 ml  Output    150 ml  Net   1053 ml   Blood pressure 132/87, pulse 61, temperature 98.5 F (36.9 C), temperature source Oral, resp. rate 15, height 5\' 5"  (1.651 m), weight 64.4 kg (141 lb 15.6 oz), SpO2 98.00%.  CBG (last 3)   Basename 05/02/11 1250 05/02/11 0813 05/02/11 0552  GLUCAP 134* 109* 98   Physical Exam: General: No acute respiratory distress - markedly somnolent Lungs: Clear to auscultation bilaterally without wheezes or crackles Cardiovascular: Regular rate and rhythm without murmur gallop or rub normal S1 and S2 Abdomen: Nontender, nondistended, soft, bowel sounds positive, no rebound, no ascites, no appreciable mass Extremities: No significant cyanosis, clubbing, or edema bilateral lower extremities  Lab Results:  Basename 05/02/11 0650 05/02/11 0423 05/01/11 0500 04/30/11 0400  NA 133* 120* 134* --  K 3.8 3.6 4.2 --  CL 95* 84* 96 --  CO2  28 25 23  --  GLUCOSE 96 875* 61* --  BUN 19 16 33* --  CREATININE 5.24* 4.59* 7.92* --  CALCIUM 10.1 9.0 9.6 --  MG -- -- -- 2.0  PHOS -- -- 5.7* --    Basename 05/01/11 0500  AST --  ALT --  ALKPHOS --  BILITOT --  PROT --  ALBUMIN 2.7*    Basename 05/01/11 0500 04/30/11 0415  WBC 4.0 7.8  NEUTROABS -- --  HGB 10.1* 11.2*  HCT 31.0* 35.0*  MCV 77.7* 81.0  PLT 111* 109*    Basename 04/30/11 0948  CKTOTAL 117  CKMB 2.4  CKMBINDEX --  TROPONINI <0.30   Micro Results: Recent Results (from the past 240 hour(s))  MRSA PCR SCREENING     Status: Normal   Collection Time   04/29/11  3:39 PM      Component Value Range Status Comment   MRSA by PCR NEGATIVE  NEGATIVE  Final     Studies/Results: All recent x-ray/radiology reports have been reviewed in detail.   Medications: I have reviewed the patient's complete medication list.  Assessment/Plan:  Seizure - Primary Event Generalized tonic-clonic type - Neuro has seen the pt - cont on Valproic acid BID Wellbutrin should not be resumed, avoid other meds that decrease seizure threshold.   "Cardiac arrest" No further cardiac work up  Pulmonary edema Much improved at this time w/ ongoing HD   Altered mental status/acute toxic metabolic encephalopathy CT of head was w/o acute findings - suspect this is simply  a protracted post-ictal state - follow clinically  Recurrent hypoglycemia Is on D10 at present, and yet CBG remains borderline - does not appear to be on any potentially offending meds - will follow trend - if CBG continues to fluctuate, may require eval for insulinoma -as she is eating now, will d/c d10  Known hx of CAD Stent to RCA  ESRD on HD Nephrology is following   Hx of clear cell kidney CA  HTN Much better control today  COPD Well compensated at present  DNR- Pt has decided today to be a full code.   Disposition- daughter feels pat is not safe at home but pt wants to return home. They have  asked social services to assist.   Calvert Cantor, MD [facility] Office  [phone] Pager [phone]  On-Call/Text Page:      [email or web address]      password [facility]

## 2011-05-02 NOTE — Progress Notes (Signed)
Patient ID: [name], female   DOB: [birth year], 66 y.o.   MRN: [id number]   [facility] Progress Note    Subjective:   Reports to be feeling much better today and more lucid/awake overnight   Objective:   BP 156/54  Pulse 61  Temp(Src) 98 F (36.7 C) (Oral)  Resp 15  Ht 5\' 5"  (1.651 m)  Wt 64.4 kg (141 lb 15.6 oz)  BMI 23.63 kg/m2  SpO2 98%  Physical Exam: WUJ:[id number] resting in bed AOZ:HYQMV RRR, normal S1 and S2  Resp:CTA bilaterally, no rales/rhinchi HQI:ONGE, flat, NT, BS normal Ext:No LE edema  Labs: BMET  Lab 05/02/11 0650 05/02/11 0423 05/01/11 0500 04/30/11 0400 04/29/11 1540 04/29/11 1132 04/29/11 1112  NA 133* 120* 134* 133* -- 139 137  K 3.8 3.6 4.2 4.3 -- 3.6 3.7  CL 95* 84* 96 94* -- 99 97  CO2 28 25 23 26  -- -- 27  GLUCOSE 96 875* 61* 59* -- 116* 112*  BUN 19 16 33* 22 -- 12 13  CREATININE 5.24* 4.59* 7.92* 6.03* 5.04* 4.50* 4.41*  ALB -- -- -- -- -- -- --  CALCIUM 10.1 9.0 9.6 10.0 -- -- 9.4  PHOS -- -- 5.7* -- -- -- --   CBC  Lab 05/01/11 0500 04/30/11 0415 04/29/11 1540 04/29/11 1132 04/29/11 1112  WBC 4.0 7.8 6.9 -- 6.3  NEUTROABS -- -- -- -- 4.4  HGB 10.1* 11.2* 10.6* 13.6 --  HCT 31.0* 35.0* 33.5* 40.0 --  MCV 77.7* 81.0 80.3 -- 80.3  PLT 111* 109* 126* -- 127*    @IMGRELPRIORS @ Medications:      . amLODipine  10 mg Oral Daily  . aspirin EC  81 mg Oral Daily  . atorvastatin  10 mg Oral q1800  . carvedilol  25 mg Oral BID WC  . citalopram  20 mg Oral Daily  . feeding supplement  1 Container Oral TID BM  . Fluticasone-Salmeterol  1 puff Inhalation Q12H  . hydrALAZINE  25 mg Oral Q8H  . levothyroxine  88 mcg Oral Daily  . LORazepam      . multivitamin  1 tablet Oral Daily  . pantoprazole  40 mg Oral Q1200  . paricalcitol  7 mcg Intravenous 3 times weekly  . sevelamer  1,600 mg Oral TID WC  . sodium chloride  3 mL Intravenous Q12H  . valproate sodium  750 mg Intravenous Q12H  . DISCONTD: hydrALAZINE  25 mg  Oral BID  . DISCONTD: potassium chloride  10 mEq Intravenous Q1H  . DISCONTD: valproate sodium  500 mg Intravenous Q8H  . DISCONTD: valproate sodium  750 mg Intravenous Q8H     Assessment/ Plan:   1. Seizure activity - generalized tonic-clonic seizure during HD with unresponsiveness prompting CPR; history of epilepsy requiring medication and now restarted on Valproic acid by neurology (allergic to Dilantin). Seizure-free since admission with occasional episodes of agitation 2. ESRD - HD [facility]WF [facility]; Plan for HD tomorrow. 3. Hypertension/volume - monitor blood pressures- currently fairly controlled. Remains on hydralazine, amlodipine and carvedilol. 4. Anemia - hemoglobin currently at goal, remains off Epogen/off iron. 5. Metabolic bone disease - hyperphosphatemic, we'll restart binders when mentally more aware and able to tolerate the same. Anticipate some improvement status post dialysis. 6. Malnutrition - last Alb 3.4, recently on Megace. 7. Depression - on Celexa.  8. DNR- had out-of-hospital DNR prior to admission.    Zetta Bills, MD 05/02/2011, 9:08 AM

## 2011-05-03 ENCOUNTER — Inpatient Hospital Stay (HOSPITAL_COMMUNITY): Payer: Medicare Other

## 2011-05-03 DIAGNOSIS — I469 Cardiac arrest, cause unspecified: Secondary | ICD-10-CM

## 2011-05-03 DIAGNOSIS — G931 Anoxic brain damage, not elsewhere classified: Secondary | ICD-10-CM

## 2011-05-03 DIAGNOSIS — R5381 Other malaise: Secondary | ICD-10-CM

## 2011-05-03 DIAGNOSIS — R569 Unspecified convulsions: Secondary | ICD-10-CM

## 2011-05-03 DIAGNOSIS — R4182 Altered mental status, unspecified: Secondary | ICD-10-CM

## 2011-05-03 LAB — RENAL FUNCTION PANEL
Albumin: 2.5 g/dL — ABNORMAL LOW (ref 3.5–5.2)
Calcium: 9.6 mg/dL (ref 8.4–10.5)
Creatinine, Ser: 7.43 mg/dL — ABNORMAL HIGH (ref 0.50–1.10)
GFR calc non Af Amer: 5 mL/min — ABNORMAL LOW (ref >90)

## 2011-05-03 LAB — VALPROIC ACID LEVEL: Valproic Acid Lvl: 67 ug/mL (ref 50.0–100.0)

## 2011-05-03 LAB — CBC
MCH: 25.9 pg — ABNORMAL LOW (ref 26.0–34.0)
MCV: 78.6 fL (ref 78.0–100.0)
Platelets: 133 10*3/uL — ABNORMAL LOW (ref 150–400)
RDW: 17.1 % — ABNORMAL HIGH (ref 11.5–15.5)

## 2011-05-03 MED ORDER — PARICALCITOL 5 MCG/ML IV SOLN
INTRAVENOUS | Status: AC
  Administered 2011-05-03: 7 ug via INTRAVENOUS
  Filled 2011-05-03: qty 2

## 2011-05-03 NOTE — Progress Notes (Addendum)
[facility] Progress Note  05/03/2011   Subjective: No further seizure activity on depakote.  Pt is ambulating with PT. She says she lives alone and cares for herself.    Objective:  Vital signs in last 24 hours: Filed Vitals:   05/03/11 1200 05/03/11 1230 05/03/11 1241 05/03/11 1315  BP: 161/78 136/70 197/92 189/77  Pulse: 71 66 67 68  Temp:   97.2 F (36.2 C) 97.9 F (36.6 C)  TempSrc:   Oral   Resp:   15 17  Height:      Weight:   64.6 kg (142 lb 6.7 oz)   SpO2:   98% 100%   Weight change: 0.3 kg (10.6 oz)  Intake/Output Summary (Last 24 hours) at 05/03/11 1526 Last data filed at 05/03/11 1241  Gross per 24 hour  Intake    240 ml  Output   2050 ml  Net  -1810 ml   No results found for this basename: HGBA1C   Lab Results  Component Value Date   LDLCALC 87 02/22/2011   CREATININE 7.43* 05/03/2011    Review of Systems As above, otherwise all reviewed and reported negative  Physical Exam General - awake, no distress, cooperative HEENT - NCAT, MMM Lungs - BBS, CTA CV - normal s1, s2 sounds Abd - soft, nondistended, no masses, nontender Ext - no C/C/E  Lab Results: Results for orders placed during the hospital encounter of 04/29/11 (from the past 24 hour(s))  GLUCOSE, CAPILLARY     Status: Abnormal   Collection Time   05/02/11  4:20 PM      Component Value Range   Glucose-Capillary 113 (*) 70 - 99 (mg/dL)   Comment 1 Notify RN     Comment 2 Documented in Chart    VALPROIC ACID LEVEL     Status: Normal   Collection Time   05/03/11  5:07 AM      Component Value Range   Valproic Acid Lvl 67.0  50.0 - 100.0 (ug/mL)  CBC     Status: Abnormal   Collection Time   05/03/11  9:22 AM      Component Value Range   WBC 3.9 (*) 4.0 - 10.5 (K/uL)   RBC 3.79 (*) 3.87 - 5.11 (MIL/uL)   Hemoglobin 9.8 (*) 12.0 - 15.0 (g/dL)   HCT 16.1 (*) 09.6 - 46.0 (%)   MCV 78.6  78.0 - 100.0 (fL)   MCH 25.9 (*) 26.0 - 34.0 (pg)   MCHC 32.9  30.0 - 36.0 (g/dL)   RDW 04.5 (*)  40.9 - 15.5 (%)   Platelets 133 (*) 150 - 400 (K/uL)  RENAL FUNCTION PANEL     Status: Abnormal   Collection Time   05/03/11  9:23 AM      Component Value Range   Sodium 127 (*) 135 - 145 (mEq/L)   Potassium 4.3  3.5 - 5.1 (mEq/L)   Chloride 89 (*) 96 - 112 (mEq/L)   CO2 28  19 - 32 (mEq/L)   Glucose, Bld 76  70 - 99 (mg/dL)   BUN 37 (*) 6 - 23 (mg/dL)   Creatinine, Ser 8.11 (*) 0.50 - 1.10 (mg/dL)   Calcium 9.6  8.4 - 91.4 (mg/dL)   Phosphorus 5.9 (*) 2.3 - 4.6 (mg/dL)   Albumin 2.5 (*) 3.5 - 5.2 (g/dL)   GFR calc non Af Amer 5 (*) >90 (mL/min)   GFR calc Af Amer 6 (*) >90 (mL/min)    Micro Results: Recent Results (from  the past 240 hour(s))  MRSA PCR SCREENING     Status: Normal   Collection Time   04/29/11  3:39 PM      Component Value Range Status Comment   MRSA by PCR NEGATIVE  NEGATIVE  Final     Medications:  Scheduled Meds:   . amLODipine  10 mg Oral Daily  . aspirin EC  81 mg Oral Daily  . atorvastatin  10 mg Oral q1800  . carvedilol  25 mg Oral BID WC  . citalopram  20 mg Oral Daily  . divalproex  750 mg Oral Q12H  . feeding supplement  1 Container Oral TID BM  . Fluticasone-Salmeterol  1 puff Inhalation Q12H  . hydrALAZINE  25 mg Oral Q8H  . levothyroxine  88 mcg Oral Daily  . multivitamin  1 tablet Oral Daily  . pantoprazole  40 mg Oral Q1200  . paricalcitol  7 mcg Intravenous 3 times weekly  . sevelamer  1,600 mg Oral TID WC  . sodium chloride  3 mL Intravenous Q12H   Continuous Infusions:   . DISCONTD: dextrose     PRN Meds:.acetaminophen, acetaminophen, albuterol, hydrALAZINE, LORazepam, nitroGLYCERIN, ondansetron (ZOFRAN) IV, ondansetron, polyethylene glycol  Assessment/Plan:  Seizure - Primary Event  Generalized tonic-clonic type - Neuro has seen the pt - cont on Valproic acid BID  Wellbutrin should not be resumed, avoid other meds that decrease seizure threshold.  Plan home tomorrow if stable.    "Cardiac arrest"  No further cardiac work up     Pulmonary edema  Much improved at this time w/ ongoing HD   Altered mental status/acute toxic metabolic encephalopathy  CT of head was w/o acute findings - suspect this is simply a protracted post-ictal state - currently much improved  Recurrent hypoglycemia  - no recent recurrence  Known hx of CAD  Stent to RCA   ESRD on HD  Nephrology is following   Hx of clear cell kidney CA   HTN  Much better control today   COPD  Well compensated at present   DNR- Pt has decided today to be a full code.   Disposition- daughter feels pt is not safe at home but pt wants to return home.  I discussed with PT who recommended CIR.  I have asked social services to assist.  Pt stable for discharge to CIR inpatient rehab when bed available.     LOS: 4 days   Maple Odaniel 05/03/2011, 3:26 PM   Cleora Fleet, MD, CDE, FAAFP [facility] [place], [place]  [phone]

## 2011-05-03 NOTE — Progress Notes (Signed)
Physical Therapy Evaluation Patient Details Name: [name] MRN: [id number] DOB: 1945-11-30 Today's Date: 05/03/2011 Time: 3086-5784 PT Time Calculation (min): 24 min  PT Assessment / Plan / Recommendation Clinical Impression  66 yo female admitted with seizure presents to PT with decreased functional mobility and increased fall risk;   Will benefit from PT to maximize independence and safety with mobility, amb, and to decr fall risk and facilitate dc planning;   Of note, pt's daughters are appropriately concerned about pt's safety at home alone;   Feel it is worth considering post-acute rehab -- recommend Rehab Consult to consider possible rehab stay --   Will need more info re: available assist once home; (It is likely pt will decline SNF)    PT Assessment  Patient needs continued PT services    Follow Up Recommendations  Inpatient Rehab    Equipment Recommendations  Defer to next venue    Frequency Min 3X/week    Precautions / Restrictions Precautions Precautions: Fall   Pertinent Vitals/Pain no apparent distress       Mobility  Bed Mobility Bed Mobility: Supine to Sit;Sitting - Scoot to Edge of Bed Supine to Sit: 4: Min assist;With rails Sitting - Scoot to [place] of Bed: 4: Min assist Details for Bed Mobility Assistance: Required handheld assist for lifting trunk off of bed Transfers Transfers: Sit to Stand;Stand to Sit Sit to Stand: 4: Min assist;With upper extremity assist;From bed Stand to Sit: 4: Min assist;With upper extremity assist;To chair/3-in-1;With armrests Details for Transfer Assistance: Cues for safety and control; Noted uncontrolled descent with stand to sit and dependence on UE push for successful sit to stand, indicating LE weakness Ambulation/Gait Ambulation/Gait Assistance: 3: Mod assist Ambulation Distance (Feet): 80 Feet Assistive device: Rolling walker Ambulation/Gait Assistance Details: Up to mod assist as pt had 2 big losses of balance  requiring assist to steady self; tends to lean posteriorly, keeping weight on heels  Gait Pattern: Decreased step length - right;Decreased step length - left;Decreased stride length Gait velocity: slow    Exercises     PT Goals Acute Rehab PT Goals PT Goal Formulation: With patient Time For Goal Achievement: 05/03/11 Potential to Achieve Goals: Good Pt will go Supine/Side to Sit: with modified independence PT Goal: Supine/Side to Sit - Progress: Goal set today Pt will go Sit to Supine/Side: with modified independence PT Goal: Sit to Supine/Side - Progress: Goal set today Pt will go Sit to Stand: with modified independence PT Goal: Sit to Stand - Progress: Goal set today Pt will go Stand to Sit: with modified independence PT Goal: Stand to Sit - Progress: Goal set today Pt will Ambulate: >150 feet;with modified independence;with least restrictive assistive device PT Goal: Ambulate - Progress: Goal set today Pt will Perform Home Exercise Program: Independently PT Goal: Perform Home Exercise Program - Progress: Goal set today  Visit Information  Last PT Received On: 05/03/11 Assistance Needed: +1    Subjective Data  Subjective: "i don't have any problem"  Patient Stated Goal: Be independent and go home   Prior Functioning  Home Living Lives With: Alone Available Help at Discharge: Friend(s);Available PRN/intermittently (pt reports friends check in on her "nearly daily") Type of Home: Apartment Home Access: Level entry Home Layout: One level Bathroom Shower/Tub: Health visitor: Handicapped height Bathroom Accessibility: Yes How Accessible: Accessible via walker Home Adaptive Equipment: Walker - four wheeled;Straight cane;Shower chair without back Additional Comments: Pt very much wants to dc home, Daughters worry about pt safety;  Need to find out how much assist daughters can give pt once she goes home; can pt stay with a doughter, or vice versa? Prior  Function Level of Independence: Needs assistance (Needs assistance with shopping) Driving: No Communication Communication: No difficulties    Cognition  Overall Cognitive Status: Impaired Area of Impairment: Problem solving;Memory Arousal/Alertness: Awake/alert Orientation Level: Appears intact for tasks assessed Behavior During Session: [facility] for tasks performed Memory Deficits: Noted pt attempted to walk back to wrong room; needed cues for room number Problem Solving: Required assist for managing RW with objects like curtain, door frame Cognition - Other Comments: Decreased awareness of balance defecits and fall risk    Extremity/Trunk Assessment Right Upper Extremity Assessment RUE ROM/Strength/Tone: [facility] for tasks assessed Left Upper Extremity Assessment LUE ROM/Strength/Tone: WFL for tasks assessed Right Lower Extremity Assessment RLE ROM/Strength/Tone: Deficits RLE ROM/Strength/Tone Deficits: Generalized weakness, dependent on UE push for successful sit to stand Left Lower Extremity Assessment LLE ROM/Strength/Tone: Deficits LLE ROM/Strength/Tone Deficits: Generalized weakness, dependent on UE push for successful sit to stand   Balance Standardized Balance Assessment Standardized Balance Assessment:  (Recommend Berg next session)  End of Session PT - End of Session Equipment Utilized During Treatment: Gait belt Activity Tolerance: Patient tolerated treatment well Patient left: in chair;with call bell/phone within reach   Van Clines [facility], [facility], [place] [phone]  05/03/2011, 4:18 PM

## 2011-05-03 NOTE — Progress Notes (Signed)
Patient ID: [name], female   DOB: [birth year], 66 y.o.   MRN: [id number]   [facility] Progress Note    Subjective:   No emerging complaints overnight- seizure fee   Objective:   BP 166/64  Pulse 62  Temp(Src) 98.5 F (36.9 C) (Oral)  Resp 20  Ht 5\' 5"  (1.651 m)  Wt 66.4 kg (146 lb 6.2 oz)  BMI 24.36 kg/m2  SpO2 100%  Physical Exam: HQI:[id number] resting in bed KGM:WNUUV RRR, normal S1 and S2  Resp:CTA bilaterally, no rales/rhonchi OZD:GUYQ, flat, NT Ext:No LE edema  Labs: BMET  Lab 05/02/11 0650 05/02/11 0423 05/01/11 0500 04/30/11 0400 04/29/11 1540 04/29/11 1132 04/29/11 1112  NA 133* 120* 134* 133* -- 139 137  K 3.8 3.6 4.2 4.3 -- 3.6 3.7  CL 95* 84* 96 94* -- 99 97  CO2 28 25 23 26  -- -- 27  GLUCOSE 96 875* 61* 59* -- 116* 112*  BUN 19 16 33* 22 -- 12 13  CREATININE 5.24* 4.59* 7.92* 6.03* 5.04* 4.50* 4.41*  ALB -- -- -- -- -- -- --  CALCIUM 10.1 9.0 9.6 10.0 -- -- 9.4  PHOS -- -- 5.7* -- -- -- --   CBC  Lab 05/01/11 0500 04/30/11 0415 04/29/11 1540 04/29/11 1132 04/29/11 1112  WBC 4.0 7.8 6.9 -- 6.3  NEUTROABS -- -- -- -- 4.4  HGB 10.1* 11.2* 10.6* 13.6 --  HCT 31.0* 35.0* 33.5* 40.0 --  MCV 77.7* 81.0 80.3 -- 80.3  PLT 111* 109* 126* -- 127*    @IMGRELPRIORS @ Medications:      . amLODipine  10 mg Oral Daily  . aspirin EC  81 mg Oral Daily  . atorvastatin  10 mg Oral q1800  . carvedilol  25 mg Oral BID WC  . citalopram  20 mg Oral Daily  . divalproex  750 mg Oral Q12H  . feeding supplement  1 Container Oral TID BM  . Fluticasone-Salmeterol  1 puff Inhalation Q12H  . hydrALAZINE  25 mg Oral Q8H  . levothyroxine  88 mcg Oral Daily  . multivitamin  1 tablet Oral Daily  . pantoprazole  40 mg Oral Q1200  . paricalcitol  7 mcg Intravenous 3 times weekly  . sevelamer  1,600 mg Oral TID WC  . sodium chloride  3 mL Intravenous Q12H  . valproate sodium  750 mg Intravenous Once  . DISCONTD: divalproex  750 mg Oral Q12H  .  DISCONTD: valproate sodium  750 mg Intravenous Q12H     Assessment/ Plan:   1. Seizure activity - generalized tonic-clonic seizure during HD with unresponsiveness prompting CPR; history of epilepsy requiring medication and now restarted on Valproic acid by neurology (allergic to Dilantin). Seizure-free since admission and now mental status back to baseline- anticipate DC soon 2. ESRD - HD on MWF @ [facility]; Plan for HD today- cautious UF with limited PO intake. 3. Hypertension/volume - monitor blood pressures- currently fairly controlled. Remains on hydralazine, amlodipine and carvedilol. 4. Anemia - hemoglobin currently at goal, remains off Epogen/off iron. 5. Metabolic bone disease - hyperphosphatemic, await labs from this AM 6. Malnutrition - last Alb 3.4, recently on Megace. 7. Depression - on Celexa.  8. DNR- had out-of-hospital DNR prior to admission.    Zetta Bills, MD 05/03/2011, 8:43 AM

## 2011-05-03 NOTE — Procedures (Signed)
I was present at this session.  I have reviewed the session itself and made appropriate changes.  Markiesha Delia K. 5/1/20139:42 AM

## 2011-05-03 NOTE — Consult Note (Signed)
Physical Medicine and Rehabilitation Consult Reason for Consult: Seizure Referring Phsyician: Dr. Simmie Davies [name] is an 66 y.o. female.   HPI: 66 year old right handed African American female with history of end-stage renal disease hemodialysis as well as prior history of seizure with by report all anti-seizure medications were stopped approximately one year ago, recent history of taka tsubo cardiomyopathy as well as stroke. Patient independent prior to admission living alone. Admitted 04/29/11 after cardiac arrest followed by seizure witnessed by nursing staff during her dialysis. Cranial CT scan negative. She is a known DO NOT RESUSCITATE. Neurology consulted placed on Depakote 750 mg every 12 hours. No further seizure activity noted. She continues with dialysis as directed. M.D. is requested physical medicine rehabilitation consult to consider inpatient rehabilitation services  Review of Systems  Cardiovascular: Positive for leg swelling.  Neurological: Positive for seizures.  All other systems reviewed and are negative.   Past Medical History  Diagnosis Date  . CAD (coronary artery disease)     stent to RCA  . CVA (cerebral infarction) 2003    no apparent residual  . Renal failure     related to hypertension  . Hypothyroidism   . PVD (peripheral vascular disease)   . Hyperlipidemia   . Positive PPD     completed rifampin  . Diastolic congestive heart failure   . Renal insufficiency   . Hypertension   . COPD (chronic obstructive pulmonary disease) t  . Cancer     clear cell cancer, kidney  . Complication of anesthesia 12/2010    pt is very confused, with AMS with anesthesia   Past Surgical History  Procedure Date  . Abdominal hysterectomy   . Cholecystectomy   . D&cs   . Right ankle repair   . Left nephrectomy   . Vascular surgery 11/2010    graft inserted to left arm   Family History  Problem Relation Age of Onset  . Heart disease Father   . Hypertension Mother    . Dementia Mother   . Coronary artery disease Sister    Social History:  reports that she has been smoking Cigarettes.  She has a 106 pack-year smoking history. She does not have any smokeless tobacco history on file. She reports that she does not drink alcohol or use illicit drugs. Allergies:  Allergies  Allergen Reactions  . Iohexol Swelling and Other (See Comments)    1970s; passed out and had facial/tongue swelling.  Requires 13-hour prep with prednisone and benadryl  . Ampicillin Hives and Rash  . Meperidine Hcl Swelling and Rash    Makes tongue swell  . Morphine Rash  . Penicillins Rash  . Heparin     MDs told her not to take after reaction in ICU  . Pentazocine Lactate     Patient does not remember reaction to this med (Talwin).   . Phenytoin Other (See Comments)    Had reaction while in ICU; doesn't know.  MDs told her not to take ever again.  . Ace Inhibitors Rash   Medications Prior to Admission  Medication Sig Dispense Refill  . albuterol-ipratropium (COMBIVENT) 18-103 MCG/ACT inhaler Inhale 2 puffs into the lungs every 6 (six) hours as needed. For breathing      . amLODipine (NORVASC) 10 MG tablet Take 10 mg by mouth daily.       [name] aspirin EC 81 MG tablet Take 81 mg by mouth daily.       . carvedilol (COREG) 25 MG tablet Take  25 mg by mouth 2 (two) times daily with a meal.       . citalopram (CELEXA) 20 MG tablet Take 20 mg by mouth daily.       [name] DEXILANT 60 MG capsule Take 60 mg by mouth daily.       . Fluticasone-Salmeterol (ADVAIR) 100-50 MCG/DOSE AEPB Inhale 1 puff into the lungs every 12 (twelve) hours.      . hydrALAZINE (APRESOLINE) 25 MG tablet Take 25 mg by mouth 2 (two) times daily.       [name] levothyroxine (SYNTHROID, LEVOTHROID) 88 MCG tablet Take 88 mcg by mouth daily.       . multivitamin (RENA-VIT) TABS tablet Take 1 tablet by mouth daily.       [name] NITROSTAT 0.4 MG SL tablet Place 0.4 mg under the tongue every 5 (five) minutes as needed. For chest pain.       . polyethylene glycol powder (GLYCOLAX/MIRALAX) powder Take 17 g by mouth daily as needed. For constipation.      . pravastatin (PRAVACHOL) 40 MG tablet Take 80 mg by mouth at bedtime.       . sevelamer (RENVELA) 800 MG tablet Take 1,600 mg by mouth 3 (three) times daily with meals.       . traMADol (ULTRAM) 50 MG tablet Take 50 mg by mouth every 6 (six) hours as needed. For pain      . traZODone (DESYREL) 50 MG tablet Take 50-100 mg by mouth at bedtime.      [name] DISCONTD: buPROPion (WELLBUTRIN SR) 150 MG 12 hr tablet Take 150 mg by mouth 2 (two) times daily.        Home:    Functional History:   Functional Status:  Mobility:          ADL:    Cognition: Cognition Orientation Level: Oriented X4    Blood pressure 189/77, pulse 68, temperature 97.9 F (36.6 C), temperature source Oral, resp. rate 17, height 5\' 5"  (1.651 m), weight 64.6 kg (142 lb 6.7 oz), SpO2 100.00%. Physical Exam  Vitals reviewed. Constitutional: She is oriented to person, place, and time.  HENT:  Head: Normocephalic.  Eyes: Conjunctivae and EOM are normal. Pupils are equal, round, and reactive to light.  Neck: Neck supple. No thyromegaly present.  Cardiovascular: Normal rate and regular rhythm.   Pulmonary/Chest: Effort normal and breath sounds normal. She has no wheezes.  Abdominal: Soft. She exhibits no distension. There is no tenderness.  Neurological: She is alert and oriented to person, place, and time. No cranial nerve deficit or sensory deficit.       Generalized weakness, proximal slightly greater than distal . No gross sensory deficits. Delayed processing and some difficulty with multi-step commands. No gross limb ataxia.  Skin: Skin is warm and dry.  Psychiatric: She has a normal mood and affect. Her behavior is normal.    Results for orders placed during the hospital encounter of 04/29/11 (from the past 24 hour(s))  GLUCOSE, CAPILLARY     Status: Abnormal   Collection Time   05/02/11  4:20  PM      Component Value Range   Glucose-Capillary 113 (*) 70 - 99 (mg/dL)   Comment 1 Notify RN     Comment 2 Documented in Chart    VALPROIC ACID LEVEL     Status: Normal   Collection Time   05/03/11  5:07 AM      Component Value Range   Valproic Acid Lvl 67.0  50.0 - 100.0 (ug/mL)  CBC     Status: Abnormal   Collection Time   05/03/11  9:22 AM      Component Value Range   WBC 3.9 (*) 4.0 - 10.5 (K/uL)   RBC 3.79 (*) 3.87 - 5.11 (MIL/uL)   Hemoglobin 9.8 (*) 12.0 - 15.0 (g/dL)   HCT 40.9 (*) 81.1 - 46.0 (%)   MCV 78.6  78.0 - 100.0 (fL)   MCH 25.9 (*) 26.0 - 34.0 (pg)   MCHC 32.9  30.0 - 36.0 (g/dL)   RDW 91.4 (*) 78.2 - 15.5 (%)   Platelets 133 (*) 150 - 400 (K/uL)  RENAL FUNCTION PANEL     Status: Abnormal   Collection Time   05/03/11  9:23 AM      Component Value Range   Sodium 127 (*) 135 - 145 (mEq/L)   Potassium 4.3  3.5 - 5.1 (mEq/L)   Chloride 89 (*) 96 - 112 (mEq/L)   CO2 28  19 - 32 (mEq/L)   Glucose, Bld 76  70 - 99 (mg/dL)   BUN 37 (*) 6 - 23 (mg/dL)   Creatinine, Ser 9.56 (*) 0.50 - 1.10 (mg/dL)   Calcium 9.6  8.4 - 21.3 (mg/dL)   Phosphorus 5.9 (*) 2.3 - 4.6 (mg/dL)   Albumin 2.5 (*) 3.5 - 5.2 (g/dL)   GFR calc non Af Amer 5 (*) >90 (mL/min)   GFR calc Af Amer 6 (*) >90 (mL/min)   No results found.  Assessment/Plan: Diagnosis: seizure with cardiac arrest---- likely anoxic encephalopathy 1. Does the need for close, 24 hr/day medical supervision in concert with the patient's rehab needs make it unreasonable for this patient to be served in a less intensive setting? Yes 2. Co-Morbidities requiring supervision/potential complications: CAD, seizure, ESRD, htn 3. Due to bladder management, bowel management, safety, skin/wound care, disease management, medication administration, pain management and patient education, does the patient require 24 hr/day rehab nursing? Yes 4. Does the patient require coordinated care of a physician, rehab nurse, PT (1-2 hrs/day, 5  days/week), OT (1-2 hrs/day, 5 days/week) and SLP (1-2 hrs/day, 5 days/week) to address physical and functional deficits in the context of the above medical diagnosis(es)? Yes Addressing deficits in the following areas: balance, endurance, locomotion, strength, transferring, bowel/bladder control, bathing, dressing, feeding, grooming, toileting, cognition and psychosocial support 5. Can the patient actively participate in an intensive therapy program of at least 3 hrs of therapy per day at least 5 days per week? Yes 6. The potential for patient to make measurable gains while on inpatient rehab is excellent 7. Anticipated functional outcomes upon discharge from inpatient rehab are mod I with PT, mod I with OT, mod I with SLP. 8. Estimated rehab length of stay to reach the above functional goals is: 1 week + 9. Does the patient have adequate social supports to accommodate these discharge functional goals? Yes and Potentially 10. Anticipated D/C setting: Home 11. Anticipated post D/C treatments: HH therapy 12. Overall Rehab/Functional Prognosis: excellent  RECOMMENDATIONS: This patient's condition is appropriate for continued rehabilitative care in the following setting: CIR Patient has agreed to participate in recommended program. Yes Note that insurance prior authorization may be required for reimbursement for recommended care.  Comment: Pt is motivated to return home. Need to speak with daughters to see if they can provide assist if needed at discharge.   Ivory Broad, MD 05/03/2011

## 2011-05-03 NOTE — Progress Notes (Signed)
I met with patient and then with her permission contacted her daughter, [name], by phone. [name] has been discussing with patient for 3 to 4 months that she was concerned with patient living alone due to frequent falls. Patient has been reluctant. They both are in agreement to admit to inpt rehab and further discussion of no longer living at home alone. I discussed the possibility of the PACE program if she is eligible. She is a retired [employer] fundraiser and is aware of need for assistance, but hesitant to give up her independence. I will follow up in the morning. [phone] with questions. Please clarify if I can admit to CIR tomorrow pending bed available.

## 2011-05-03 NOTE — Progress Notes (Signed)
   CARE MANAGEMENT NOTE 05/03/2011  Patient:  [name],[name]   Account Number:  [id number]  Date Initiated:  05/03/2011  Documentation initiated by:  Onnie Boer  Subjective/Objective Assessment:   PT WAS ADMITTED WITH SEIZURES     Action/Plan:   PROGRESSION OF CARE AND DISCHARGE PLANNING   Anticipated DC Date:  05/03/2011   Anticipated DC Plan:  HOME/SELF CARE      DC Planning Services  CM consult      Choice offered to / List presented to:             Status of service:  In process, will continue to follow Medicare Important Message given?   (If response is "NO", the following Medicare IM given date fields will be blank) Date Medicare IM given:   Date Additional Medicare IM given:    Discharge Disposition:    Per UR Regulation:  Reviewed for med. necessity/level of care/duration of stay  If discussed at Long Length of Stay Meetings, dates discussed:    Comments:  05/03/11 Onnie Boer, RN, BSN 1412 PT WAS ADMITTED WITH SEIZURES.  PTA PT WAS AT HOME WITH SELF CARE.  PT TO EVAL.  WILL F/U.

## 2011-05-04 ENCOUNTER — Inpatient Hospital Stay (HOSPITAL_COMMUNITY)
Admission: RE | Admit: 2011-05-04 | Discharge: 2011-05-16 | DRG: 945 | Disposition: A | Discharge status: Home / Self Care | Payer: Medicare Other | Source: Ambulatory Visit | Attending: Physical Medicine & Rehabilitation | Admitting: Physical Medicine & Rehabilitation

## 2011-05-04 DIAGNOSIS — Z888 Allergy status to other drugs, medicaments and biological substances status: Secondary | ICD-10-CM

## 2011-05-04 DIAGNOSIS — E039 Hypothyroidism, unspecified: Secondary | ICD-10-CM

## 2011-05-04 DIAGNOSIS — Z8249 Family history of ischemic heart disease and other diseases of the circulatory system: Secondary | ICD-10-CM

## 2011-05-04 DIAGNOSIS — I739 Peripheral vascular disease, unspecified: Secondary | ICD-10-CM

## 2011-05-04 DIAGNOSIS — Z66 Do not resuscitate: Secondary | ICD-10-CM

## 2011-05-04 DIAGNOSIS — Z992 Dependence on renal dialysis: Secondary | ICD-10-CM | POA: Diagnosis not present

## 2011-05-04 DIAGNOSIS — I6789 Other cerebrovascular disease: Secondary | ICD-10-CM | POA: Diagnosis not present

## 2011-05-04 DIAGNOSIS — I2510 Atherosclerotic heart disease of native coronary artery without angina pectoris: Secondary | ICD-10-CM | POA: Diagnosis not present

## 2011-05-04 DIAGNOSIS — E871 Hypo-osmolality and hyponatremia: Secondary | ICD-10-CM | POA: Diagnosis not present

## 2011-05-04 DIAGNOSIS — I10 Essential (primary) hypertension: Secondary | ICD-10-CM | POA: Diagnosis present

## 2011-05-04 DIAGNOSIS — N186 End stage renal disease: Secondary | ICD-10-CM | POA: Diagnosis present

## 2011-05-04 DIAGNOSIS — J449 Chronic obstructive pulmonary disease, unspecified: Secondary | ICD-10-CM | POA: Diagnosis not present

## 2011-05-04 DIAGNOSIS — Z7982 Long term (current) use of aspirin: Secondary | ICD-10-CM

## 2011-05-04 DIAGNOSIS — R4182 Altered mental status, unspecified: Secondary | ICD-10-CM

## 2011-05-04 DIAGNOSIS — R569 Unspecified convulsions: Secondary | ICD-10-CM

## 2011-05-04 DIAGNOSIS — Z880 Allergy status to penicillin: Secondary | ICD-10-CM

## 2011-05-04 DIAGNOSIS — F0390 Unspecified dementia without behavioral disturbance: Secondary | ICD-10-CM | POA: Diagnosis not present

## 2011-05-04 DIAGNOSIS — R5381 Other malaise: Secondary | ICD-10-CM | POA: Diagnosis not present

## 2011-05-04 DIAGNOSIS — I509 Heart failure, unspecified: Secondary | ICD-10-CM | POA: Diagnosis not present

## 2011-05-04 DIAGNOSIS — G319 Degenerative disease of nervous system, unspecified: Secondary | ICD-10-CM | POA: Diagnosis not present

## 2011-05-04 DIAGNOSIS — Z79899 Other long term (current) drug therapy: Secondary | ICD-10-CM

## 2011-05-04 DIAGNOSIS — I120 Hypertensive chronic kidney disease with stage 5 chronic kidney disease or end stage renal disease: Secondary | ICD-10-CM | POA: Diagnosis not present

## 2011-05-04 DIAGNOSIS — J4489 Other specified chronic obstructive pulmonary disease: Secondary | ICD-10-CM

## 2011-05-04 DIAGNOSIS — E876 Hypokalemia: Secondary | ICD-10-CM

## 2011-05-04 DIAGNOSIS — E785 Hyperlipidemia, unspecified: Secondary | ICD-10-CM | POA: Diagnosis not present

## 2011-05-04 DIAGNOSIS — Z5189 Encounter for other specified aftercare: Secondary | ICD-10-CM

## 2011-05-04 DIAGNOSIS — I5032 Chronic diastolic (congestive) heart failure: Secondary | ICD-10-CM

## 2011-05-04 DIAGNOSIS — F329 Major depressive disorder, single episode, unspecified: Secondary | ICD-10-CM | POA: Diagnosis not present

## 2011-05-04 DIAGNOSIS — E46 Unspecified protein-calorie malnutrition: Secondary | ICD-10-CM

## 2011-05-04 DIAGNOSIS — G40909 Epilepsy, unspecified, not intractable, without status epilepticus: Secondary | ICD-10-CM | POA: Diagnosis present

## 2011-05-04 DIAGNOSIS — G931 Anoxic brain damage, not elsewhere classified: Secondary | ICD-10-CM

## 2011-05-04 DIAGNOSIS — F3289 Other specified depressive episodes: Secondary | ICD-10-CM

## 2011-05-04 DIAGNOSIS — F29 Unspecified psychosis not due to a substance or known physiological condition: Secondary | ICD-10-CM | POA: Diagnosis not present

## 2011-05-04 DIAGNOSIS — N039 Chronic nephritic syndrome with unspecified morphologic changes: Secondary | ICD-10-CM

## 2011-05-04 DIAGNOSIS — I469 Cardiac arrest, cause unspecified: Secondary | ICD-10-CM

## 2011-05-04 DIAGNOSIS — I69998 Other sequelae following unspecified cerebrovascular disease: Secondary | ICD-10-CM | POA: Diagnosis not present

## 2011-05-04 DIAGNOSIS — Z8673 Personal history of transient ischemic attack (TIA), and cerebral infarction without residual deficits: Secondary | ICD-10-CM

## 2011-05-04 DIAGNOSIS — I428 Other cardiomyopathies: Secondary | ICD-10-CM | POA: Diagnosis not present

## 2011-05-04 DIAGNOSIS — Z9861 Coronary angioplasty status: Secondary | ICD-10-CM

## 2011-05-04 DIAGNOSIS — F17200 Nicotine dependence, unspecified, uncomplicated: Secondary | ICD-10-CM

## 2011-05-04 DIAGNOSIS — Z905 Acquired absence of kidney: Secondary | ICD-10-CM

## 2011-05-04 DIAGNOSIS — N2581 Secondary hyperparathyroidism of renal origin: Secondary | ICD-10-CM | POA: Diagnosis not present

## 2011-05-04 DIAGNOSIS — D631 Anemia in chronic kidney disease: Secondary | ICD-10-CM

## 2011-05-04 DIAGNOSIS — R279 Unspecified lack of coordination: Secondary | ICD-10-CM | POA: Diagnosis not present

## 2011-05-04 MED ORDER — PARICALCITOL 5 MCG/ML IV SOLN
7.0000 ug | INTRAVENOUS | Status: DC
  Administered 2011-05-05: 7 ug via INTRAVENOUS
  Filled 2011-05-04 (×2): qty 1.4

## 2011-05-04 MED ORDER — RENA-VITE PO TABS
1.0000 | ORAL_TABLET | Freq: Every day | ORAL | Status: DC
  Administered 2011-05-04 – 2011-05-16 (×12): 1 via ORAL
  Filled 2011-05-04 (×14): qty 1

## 2011-05-04 MED ORDER — ALBUTEROL SULFATE (5 MG/ML) 0.5% IN NEBU: 2.5000 mg | INHALATION_SOLUTION | RESPIRATORY_TRACT | Status: DC | PRN

## 2011-05-04 MED ORDER — BOOST / RESOURCE BREEZE PO LIQD
1.0000 | Freq: Three times a day (TID) | ORAL | Status: DC
  Administered 2011-05-05 – 2011-05-09 (×9): 1 via ORAL

## 2011-05-04 MED ORDER — DIPHENHYDRAMINE HCL 25 MG PO CAPS
25.0000 mg | ORAL_CAPSULE | Freq: Four times a day (QID) | ORAL | Status: DC | PRN
  Administered 2011-05-14 – 2011-05-15 (×2): 25 mg via ORAL
  Filled 2011-05-04 (×2): qty 1

## 2011-05-04 MED ORDER — ONDANSETRON HCL 4 MG/2ML IJ SOLN: 4.0000 mg | Freq: Four times a day (QID) | INTRAMUSCULAR | Status: DC | PRN

## 2011-05-04 MED ORDER — CARVEDILOL 25 MG PO TABS
25.0000 mg | ORAL_TABLET | Freq: Two times a day (BID) | ORAL | Status: DC
  Administered 2011-05-05 – 2011-05-16 (×21): 25 mg via ORAL
  Filled 2011-05-04 (×26): qty 1

## 2011-05-04 MED ORDER — SEVELAMER CARBONATE 800 MG PO TABS
1600.0000 mg | ORAL_TABLET | Freq: Three times a day (TID) | ORAL | Status: DC
  Filled 2011-05-04 (×5): qty 2

## 2011-05-04 MED ORDER — TRAZODONE HCL 50 MG PO TABS
25.0000 mg | ORAL_TABLET | Freq: Every evening | ORAL | Status: DC | PRN
  Administered 2011-05-04 – 2011-05-16 (×4): 50 mg via ORAL
  Filled 2011-05-04 (×4): qty 1

## 2011-05-04 MED ORDER — ACETAMINOPHEN 325 MG PO TABS
325.0000 mg | ORAL_TABLET | ORAL | Status: DC | PRN
  Administered 2011-05-10: 650 mg via ORAL
  Filled 2011-05-04 (×2): qty 2

## 2011-05-04 MED ORDER — FLUTICASONE-SALMETEROL 100-50 MCG/DOSE IN AEPB
1.0000 | INHALATION_SPRAY | Freq: Two times a day (BID) | RESPIRATORY_TRACT | Status: DC
  Administered 2011-05-05 – 2011-05-16 (×21): 1 via RESPIRATORY_TRACT
  Filled 2011-05-04 (×2): qty 14

## 2011-05-04 MED ORDER — ASPIRIN EC 81 MG PO TBEC
81.0000 mg | DELAYED_RELEASE_TABLET | Freq: Every day | ORAL | Status: DC
Start: 2011-05-04 — End: 2011-05-16
  Administered 2011-05-05 – 2011-05-16 (×11): 81 mg via ORAL
  Filled 2011-05-04 (×14): qty 1

## 2011-05-04 MED ORDER — CITALOPRAM HYDROBROMIDE 20 MG PO TABS
20.0000 mg | ORAL_TABLET | Freq: Every day | ORAL | Status: DC
  Administered 2011-05-05 – 2011-05-08 (×4): 20 mg via ORAL
  Filled 2011-05-04 (×6): qty 1

## 2011-05-04 MED ORDER — AMLODIPINE BESYLATE 10 MG PO TABS
10.0000 mg | ORAL_TABLET | Freq: Every day | ORAL | Status: DC
  Administered 2011-05-05 – 2011-05-08 (×4): 10 mg via ORAL
  Filled 2011-05-04 (×8): qty 1

## 2011-05-04 MED ORDER — ONDANSETRON HCL 4 MG PO TABS: 4.0000 mg | ORAL_TABLET | Freq: Four times a day (QID) | ORAL | Status: DC | PRN

## 2011-05-04 MED ORDER — BISACODYL 10 MG RE SUPP: 10.0000 mg | Freq: Every day | RECTAL | Status: DC | PRN

## 2011-05-04 MED ORDER — LEVOTHYROXINE SODIUM 88 MCG PO TABS
88.0000 ug | ORAL_TABLET | Freq: Every day | ORAL | Status: DC
  Administered 2011-05-05 – 2011-05-16 (×11): 88 ug via ORAL
  Filled 2011-05-04 (×16): qty 1

## 2011-05-04 MED ORDER — FLEET ENEMA 7-19 GM/118ML RE ENEM
1.0000 | ENEMA | Freq: Once | RECTAL | Status: AC | PRN
  Filled 2011-05-04: qty 1

## 2011-05-04 MED ORDER — HYDRALAZINE HCL 25 MG PO TABS
25.0000 mg | ORAL_TABLET | Freq: Three times a day (TID) | ORAL | Status: DC
  Administered 2011-05-04 – 2011-05-08 (×11): 25 mg via ORAL
  Filled 2011-05-04 (×15): qty 1

## 2011-05-04 MED ORDER — PANTOPRAZOLE SODIUM 40 MG PO TBEC
40.0000 mg | DELAYED_RELEASE_TABLET | Freq: Every day | ORAL | Status: DC
  Administered 2011-05-05 – 2011-05-15 (×10): 40 mg via ORAL
  Filled 2011-05-04 (×9): qty 1

## 2011-05-04 MED ORDER — ATORVASTATIN CALCIUM 10 MG PO TABS
10.0000 mg | ORAL_TABLET | Freq: Every day | ORAL | Status: DC
  Administered 2011-05-05 – 2011-05-15 (×11): 10 mg via ORAL
  Filled 2011-05-04 (×13): qty 1

## 2011-05-04 MED ORDER — ALUMINUM HYDROXIDE GEL 320 MG/5ML PO SUSP
10.0000 mL | Freq: Four times a day (QID) | ORAL | Status: DC | PRN
  Filled 2011-05-04: qty 30

## 2011-05-04 MED ORDER — GUAIFENESIN-DM 100-10 MG/5ML PO SYRP: 5.0000 mL | ORAL_SOLUTION | Freq: Four times a day (QID) | ORAL | Status: DC | PRN

## 2011-05-04 MED ORDER — POLYETHYLENE GLYCOL 3350 17 G PO PACK
17.0000 g | PACK | Freq: Every day | ORAL | Status: DC | PRN
  Filled 2011-05-04: qty 1

## 2011-05-04 MED ORDER — DIVALPROEX SODIUM 500 MG PO DR TAB
750.0000 mg | DELAYED_RELEASE_TABLET | Freq: Two times a day (BID) | ORAL | Status: DC
  Administered 2011-05-04 – 2011-05-09 (×9): 750 mg via ORAL
  Filled 2011-05-04 (×13): qty 1

## 2011-05-04 MED ORDER — NITROGLYCERIN 0.4 MG SL SUBL: 0.4000 mg | SUBLINGUAL_TABLET | SUBLINGUAL | Status: DC | PRN

## 2011-05-04 MED ORDER — LORAZEPAM 2 MG/ML IJ SOLN: 1.0000 mg | Freq: Four times a day (QID) | INTRAMUSCULAR | Status: DC | PRN

## 2011-05-04 MED ORDER — CAMPHOR-MENTHOL 0.5-0.5 % EX LOTN: TOPICAL_LOTION | CUTANEOUS | Status: DC | PRN

## 2011-05-04 NOTE — Progress Notes (Signed)
OT Cancellation Note  Treatment cancelled today due to:  OT to defer OT evaluation to CIR. Pt is to be admitted 05/04/11 per Ottie Glazier.   [name]   OTR/L Pager: [phone] Office: [phone] .

## 2011-05-04 NOTE — Progress Notes (Signed)
Inpt rehab bed is available today and we plan admit. Patient is in agreement. [phone]

## 2011-05-04 NOTE — H&P (Signed)
Physical Medicine and Rehabilitation Admission H&P    Chief Complaint  Patient presents with  . Altered Mental Status, cardiac arrest, seizure  : HPI: [name] is an 66 y.o. right handed African American female with history of end-stage renal disease on hemodialysis, dementia, prior history of seizure and per report all anti-seizure medications were stopped approximately one year ago.  Admitted 04/29/11 after cardiac arrest followed by seizure witnessed by nursing staff during her dialysis. Patient required 5 minutes of chest compressions. Patient with another seizure activity in ED. Patient with history of cardiac cath with stent 02/2011 and Dr. Antoine Poche consulted for input. Cardiac enzymes negative. Cranial CT scan negative. Neurology consulted and Depakote initiated for treatment.   2D echo done revealing EF 50-55%.  Patient without cardiac etiology and no further work up indicated. Has been seizure free.  Lethargy and confusion is resolving. Has been seizure free and Depakote 750 mg bid recommended with F/U with Dr. Anne Hahn past discharge.  Therapies initiated and balance deficits noted.  CIR consulted for progression.   Review of Systems  Eyes: Negative for blurred vision and double vision.  Respiratory: Positive for shortness of breath (with activity). Negative for cough and wheezing.   Cardiovascular: Negative for chest pain and palpitations.  Gastrointestinal: Positive for diarrhea (chronic history-hasn't had any since admission.) and constipation.  Genitourinary: Negative for dysuria and urgency.  Musculoskeletal: Negative for joint pain.  Neurological: Positive for speech change ( premorbid expressive problems).  Psychiatric/Behavioral: Positive for memory loss (problems with memory-?dementia). Negative for hallucinations. The patient is not nervous/anxious and does not have insomnia.    Past Medical History  Diagnosis Date  . CAD (coronary artery disease)     stent to RCA  .  CVA (cerebral infarction) 2003    no apparent residual  . Renal failure     related to hypertension  . Hypothyroidism   . PVD (peripheral vascular disease)   . Hyperlipidemia   . Positive PPD     completed rifampin  . Diastolic congestive heart failure   . Renal insufficiency   . Hypertension   . COPD (chronic obstructive pulmonary disease) t  . Cancer     clear cell cancer, kidney  . Complication of anesthesia 12/2010    pt is very confused, with AMS with anesthesia   Past Surgical History  Procedure Date  . Abdominal hysterectomy   . Cholecystectomy   . D&cs   . Right ankle repair   . Left nephrectomy   . Vascular surgery 11/2010    graft inserted to left arm   Family History  Problem Relation Age of Onset  . Heart disease Father   . Hypertension Mother   . Dementia Mother   . Coronary artery disease Sister    Social History: Lives alone and independent PTA. Does not drive per family request (used to get lost driving). She reports that she has been smoking Cigarettes.  She has a 106 pack-year smoking history-trying to quit smoking. She does not have any smokeless tobacco history on file. She reports that she does not drink alcohol or use illicit drugs.   Allergies  Allergen Reactions  . Iohexol Swelling and Other (See Comments)    1970s; passed out and had facial/tongue swelling.  Requires 13-hour prep with prednisone and benadryl  . Ampicillin Hives and Rash  . Meperidine Hcl Swelling and Rash    Makes tongue swell  . Morphine Rash  . Penicillins Rash  . Heparin  MDs told her not to take after reaction in ICU  . Pentazocine Lactate     Patient does not remember reaction to this med (Talwin).   . Phenytoin Other (See Comments)    Had reaction while in ICU; doesn't know.  MDs told her not to take ever again.  . Ace Inhibitors Rash    Scheduled Meds:    . amLODipine  10 mg Oral Daily  . aspirin EC  81 mg Oral Daily  . atorvastatin  10 mg Oral q1800  .  carvedilol  25 mg Oral BID WC  . citalopram  20 mg Oral Daily  . divalproex  750 mg Oral Q12H  . feeding supplement  1 Container Oral TID BM  . Fluticasone-Salmeterol  1 puff Inhalation Q12H  . hydrALAZINE  25 mg Oral Q8H  . levothyroxine  88 mcg Oral Daily  . multivitamin  1 tablet Oral QHS  . pantoprazole  40 mg Oral Q1200  . paricalcitol  7 mcg Intravenous Q M,W,F-HD  . sevelamer  1,600 mg Oral TID WC    Medications Prior to Admission  Medication Sig Dispense Refill  . albuterol-ipratropium (COMBIVENT) 18-103 MCG/ACT inhaler Inhale 2 puffs into the lungs every 6 (six) hours as needed. For breathing      . amLODipine (NORVASC) 10 MG tablet Take 10 mg by mouth daily.       . carvedilol (COREG) 25 MG tablet Take 25 mg by mouth 2 (two) times daily with a meal.       . citalopram (CELEXA) 20 MG tablet Take 20 mg by mouth daily.       [name] DEXILANT 60 MG capsule Take 60 mg by mouth daily.       . hydrALAZINE (APRESOLINE) 25 MG tablet Take 25 mg by mouth 2 (two) times daily.       [name] levothyroxine (SYNTHROID, LEVOTHROID) 88 MCG tablet Take 88 mcg by mouth daily.       . multivitamin (RENA-VIT) TABS tablet Take 1 tablet by mouth daily.       . polyethylene glycol powder (GLYCOLAX/MIRALAX) powder Take 17 g by mouth daily as needed. For constipation.      . pravastatin (PRAVACHOL) 40 MG tablet Take 80 mg by mouth at bedtime.       . sevelamer (RENVELA) 800 MG tablet Take 1,600 mg by mouth 3 (three) times daily with meals.       . traMADol (ULTRAM) 50 MG tablet Take 50 mg by mouth every 6 (six) hours as needed. For pain      . traZODone (DESYREL) 50 MG tablet Take 50-100 mg by mouth at bedtime.      [name] aspirin EC 81 MG tablet Take 81 mg by mouth daily.       . Fluticasone-Salmeterol (ADVAIR) 100-50 MCG/DOSE AEPB Inhale 1 puff into the lungs every 12 (twelve) hours.      [name] NITROSTAT 0.4 MG SL tablet Place 0.4 mg under the tongue every 5 (five) minutes as needed. For chest pain.        Home:       Functional History:    Functional Status:  Mobility:          ADL:    Cognition:       There were no vitals taken for this visit. Physical Exam  Constitutional: She is oriented to person, place, and time. She appears well-developed and well-nourished.  HENT:  Head: Normocephalic and atraumatic.  Eyes: Pupils are  equal, round, and reactive to light. Right eye exhibits discharge.  Neck: Normal range of motion. Neck supple.  Cardiovascular: Regular rhythm.   No murmur heard. Pulmonary/Chest: Effort normal and breath sounds normal.  Abdominal: She exhibits no distension. There is no tenderness.  Musculoskeletal: Normal range of motion. She exhibits no edema and no tenderness.  Neurological: She is alert and oriented to person, place, and time.       ocassional problems following basic commends. Moves all four. Speech clear.   motor strength is 5/5 in bilateral deltoid, biceps, triceps, grip Motor strength in the lower extremities are 4+/5 in the hip flexors, knee extensors, ankle dorsiflexors Sensation is reduced to light touch in bilateral feet Extremities show no evidence of atrophy or edema Cognition is intact Range of motion in the upper and lower extremities is normal  Results for orders placed during the hospital encounter of 04/29/11 (from the past 48 hour(s))  VALPROIC ACID LEVEL     Status: Normal   Collection Time   05/03/11  5:07 AM      Component Value Range Comment   Valproic Acid Lvl 67.0  50.0 - 100.0 (ug/mL)   CBC     Status: Abnormal   Collection Time   05/03/11  9:22 AM      Component Value Range Comment   WBC 3.9 (*) 4.0 - 10.5 (K/uL)    RBC 3.79 (*) 3.87 - 5.11 (MIL/uL)    Hemoglobin 9.8 (*) 12.0 - 15.0 (g/dL)    HCT 16.1 (*) 09.6 - 46.0 (%)    MCV 78.6  78.0 - 100.0 (fL)    MCH 25.9 (*) 26.0 - 34.0 (pg)    MCHC 32.9  30.0 - 36.0 (g/dL)    RDW 04.5 (*) 40.9 - 15.5 (%)    Platelets 133 (*) 150 - 400 (K/uL)   RENAL FUNCTION PANEL     Status:  Abnormal   Collection Time   05/03/11  9:23 AM      Component Value Range Comment   Sodium 127 (*) 135 - 145 (mEq/L)    Potassium 4.3  3.5 - 5.1 (mEq/L)    Chloride 89 (*) 96 - 112 (mEq/L)    CO2 28  19 - 32 (mEq/L)    Glucose, Bld 76  70 - 99 (mg/dL)    BUN 37 (*) 6 - 23 (mg/dL) DELTA CHECK NOTED   Creatinine, Ser 7.43 (*) 0.50 - 1.10 (mg/dL)    Calcium 9.6  8.4 - 10.5 (mg/dL)    Phosphorus 5.9 (*) 2.3 - 4.6 (mg/dL)    Albumin 2.5 (*) 3.5 - 5.2 (g/dL)    GFR calc non Af Amer 5 (*) >90 (mL/min)    GFR calc Af Amer 6 (*) >90 (mL/min)     Post Admission Physician Evaluation: 1. Functional deficits secondary  to deconditioning after seizure complicated by cardiorespiratory arrest in a patient with end-stage renal disease. 2. Patient is admitted to receive collaborative, interdisciplinary care between the physiatrist, rehab nursing staff, and therapy team. 3. Patient's level of medical complexity and substantial therapy needs in context of that medical necessity cannot be provided at a lesser intensity of care such as a SNF. 4. Patient has experienced substantial functional loss from his/her baseline which was documented above under the "Functional History" and "Functional Status" headings.  Judging by the patient's diagnosis, physical exam, and functional history, the patient has potential for functional progress which will result in measurable gains while on inpatient rehab.  These gains will be of substantial and practical use upon discharge  in facilitating mobility and self-care at the household level. 5. Physiatrist will provide 24 hour management of medical needs as well as oversight of the therapy plan/treatment and provide guidance as appropriate regarding the interaction of the two. 6. 24 hour rehab nursing will assist with bladder management, bowel management, safety, skin/wound care, disease management, medication administration and pain management  and help integrate therapy concepts,  techniques,education, etc. 7. PT will assess and treat for:  Pre-gait training, gait training, endurance, safety, equipment.  Goals are: Supervision to modified independent with mobility. 8. OT will assess and treat for: ADLs, safety, equipment, endurance.   Goals are: Supervision to modified independent ADL. 9. SLP will assess and treat for: Not applicable.  Goals are: Not applicable. 10. Case Management and Social Worker will assess and treat for psychological issues and discharge planning. 11. Team conference will be held weekly to assess progress toward goals and to determine barriers to discharge. 12.  Patient will receive at least 3 hours of therapy per day at least 5 days per week. 13. ELOS and Prognosis: 10 days good   Medical Problem List and Plan: 1. DVT Prophylaxis/Anticoagulation: Mechanical: Sequential compression devices, below knee Bilateral lower extremities 2. Pain Management:  Monitor.  Denies any discomfort at current time. 3. Mood: denies any issues with depression or anxiety.  4. Seizures:  Continue depakote 750 mg bid.  Last level @66 .0 5. ESRD: continue HD on MWFschedule. 6. Anemia of chronic disease: 7. Hyponatremia:  Recheck in HD tomorrow.  Has been ranging from 66 yo 134 range. 8. CAD with NICM: continue : baby ASA with lipitor, coreg, and Norvasc.   Claudette Laws E 05/04/2011, 4:20 PM

## 2011-05-04 NOTE — Progress Notes (Signed)
Physical Therapy Treatment Patient Details Name: Iya Hamed MRN: [id number] DOB: 23-Jul-1945 Today's Date: 05/04/2011 Time: 1191-4782 PT Time Calculation (min): 24 min  PT Assessment / Plan / Recommendation Comments on Treatment Session  Did well today. Wanted to eat her breakfast so limited by that. Some improvement from yesterday.     Follow Up Recommendations  Inpatient Rehab    Equipment Recommendations  Defer to next venue    Frequency Min 3X/week   Plan Discharge plan remains appropriate;Frequency remains appropriate    Precautions / Restrictions Precautions Precautions: Fall       Mobility  Bed Mobility Supine to Sit: 5: Supervision Sitting - Scoot to Edge of Bed: 6: Modified independent (Device/Increase time) Transfers Sit to Stand: 4: Min guard;From bed;With armrests;From chair/3-in-1;From toilet;With upper extremity assist Stand to Sit: 4: Min guard;With armrests;To toilet;To chair/3-in-1;To bed Details for Transfer Assistance: pt using the backs of her legs against the bed to stabilize once standing; utilizes hands with all transfers, some difficulty controlling descent to chair Ambulation/Gait Ambulation/Gait Assistance: 4: Min assist Ambulation Distance (Feet): 26 Feet Assistive device: Rolling walker Ambulation/Gait Assistance Details: ambulates quickly with decreased safety awarenss; assist to negotiate obstacles with RW; no LOB today during ambulation; head down during gait    Exercises     PT Goals Acute Rehab PT Goals PT Goal: Supine/Side to Sit - Progress: Progressing toward goal PT Goal: Sit to Stand - Progress: Progressing toward goal PT Goal: Stand to Sit - Progress: Progressing toward goal PT Goal: Ambulate - Progress: Progressing toward goal PT Goal: Perform Home Exercise Program - Progress: Progressing toward goal  Visit Information  Last PT Received On: 05/04/11 Assistance Needed: +1    Subjective Data  Subjective: Cant get no rest around  here.    Cognition  Overall Cognitive Status: Impaired Area of Impairment: Safety/judgement Arousal/Alertness: Awake/alert Behavior During Session: Flat affect Safety/Judgement: Impulsive Problem Solving: again requires assistance to negotiate RW around obstacles within the room; ambulates very quickly and poorly controlled through the room with increased impulsivity    Balance  Balance Balance Assessed: Yes Dynamic Standing Balance Dynamic Standing - Comments: following the reciprocal stepping task of the berg balance test pt standing and had 2 moderate losses of balance needing to steady herself with the bed and therapist Standardized Balance Assessment Standardized Balance Assessment: Berg Balance Test Berg Balance Test Sit to Stand: Able to stand  independently using hands Standing Unsupported: Able to stand safely 2 minutes Sitting with Back Unsupported but Feet Supported on Floor or Stool: Able to sit safely and securely 2 minutes Stand to Sit: Controls descent by using hands Transfers: Able to transfer safely, definite need of hands Standing Unsupported with Eyes Closed: Able to stand 10 seconds with supervision Standing Ubsupported with Feet Together: Able to place feet together independently and stand for 1 minute with supervision From Standing, Reach Forward with Outstretched Arm: Can reach forward >5 cm safely (2") From Standing Position, Pick up Object from Floor: Able to pick up shoe, needs supervision From Standing Position, Turn to Look Behind Over each Shoulder: Turn sideways only but maintains balance Turn 360 Degrees: Able to turn 360 degrees safely but slowly Standing Unsupported, Alternately Place Feet on Step/Stool: Able to complete >2 steps/needs minimal assist Standing Unsupported, One Foot in Front: Able to take small step independently and hold 30 seconds Standing on One Leg: Able to lift leg independently and hold equal to or more than 3 seconds Total Score:  37  End of Session PT - End of Session Equipment Utilized During Treatment: Gait belt Activity Tolerance: Patient tolerated treatment well Patient left: in bed;with bed alarm set;with nursing in room (sitting EOB with alarm on) Nurse Communication: Mobility status    WHITLOW,Matti Minney HELEN 05/04/2011, 9:36 AM

## 2011-05-04 NOTE — Progress Notes (Signed)
Patient ID: [name], female   DOB: 09-26-1945, 66 y.o.   MRN: [id number]    [facility] Progress Note    Subjective:   Reports to be feeling better and remains seizure free- possibly CIR admission prior to going back home   Objective:   BP 174/64  Pulse 66  Temp(Src) 97.7 F (36.5 C) (Oral)  Resp 18  Ht 5\' 5"  (1.651 m)  Wt 67.042 kg (147 lb 12.8 oz)  BMI 24.60 kg/m2  SpO2 99%  Physical Exam: NWG:[id number] sleeping in bed. Meal tray untouched. QIO:NGEXB RRR, Normal S1 and S2, no murmurs Resp:CTA bilaterally, no rales/rhonchi MWU:XLKG, flat, non-tender, BS normal Ext:No LE edema.   Labs: BMET  Lab 05/03/11 0923 05/02/11 0650 05/02/11 0423 05/01/11 0500 04/30/11 0400 04/29/11 1540 04/29/11 1132 04/29/11 1112  NA 127* 133* 120* 134* 133* -- 139 137  K 4.3 3.8 3.6 4.2 4.3 -- 3.6 3.7  CL 89* 95* 84* 96 94* -- 99 97  CO2 28 28 25 23 26  -- -- 27  GLUCOSE 76 96 875* 61* 59* -- 116* 112*  BUN 37* 19 16 33* 22 -- 12 13  CREATININE 7.43* 5.24* 4.59* 7.92* 6.03* 5.04* 4.50* --  ALB -- -- -- -- -- -- -- --  CALCIUM 9.6 10.1 9.0 9.6 10.0 -- -- 9.4  PHOS 5.9* -- -- 5.7* -- -- -- --   CBC  Lab 05/03/11 0922 05/01/11 0500 04/30/11 0415 04/29/11 1540 04/29/11 1112  WBC 3.9* 4.0 7.8 6.9 --  NEUTROABS -- -- -- -- 4.4  HGB 9.8* 10.1* 11.2* 10.6* --  HCT 29.8* 31.0* 35.0* 33.5* --  MCV 78.6 77.7* 81.0 80.3 --  PLT 133* 111* 109* 126* --    @IMGRELPRIORS @ Medications:      . amLODipine  10 mg Oral Daily  . aspirin EC  81 mg Oral Daily  . atorvastatin  10 mg Oral q1800  . carvedilol  25 mg Oral BID WC  . citalopram  20 mg Oral Daily  . divalproex  750 mg Oral Q12H  . feeding supplement  1 Container Oral TID BM  . Fluticasone-Salmeterol  1 puff Inhalation Q12H  . hydrALAZINE  25 mg Oral Q8H  . levothyroxine  88 mcg Oral Daily  . multivitamin  1 tablet Oral Daily  . pantoprazole  40 mg Oral Q1200  . paricalcitol  7 mcg Intravenous 3 times weekly  .  sevelamer  1,600 mg Oral TID WC  . sodium chloride  3 mL Intravenous Q12H     Assessment/ Plan:   1. Seizure activity - Seizure-free since admission and now mental status back to baseline, restarted back on depakote by neurology- anticipate DC to CIR soon to ascertain safety to DC home 2. ESRD - HD on MWF @ [facility]; Plan for HD tomorrow. 3. Hypertension/volume - monitor blood pressures- currently fairly controlled. Remains on hydralazine, amlodipine and carvedilol. 4. Anemia - hemoglobin currently at goal, remains off Epogen/off iron. 5. Metabolic bone disease - hyperphosphatemic, await labs from this AM 6. Malnutrition - last Alb 3.4, recently on Megace. 7. Depression - on Celexa.  8. DNR- had out-of-hospital DNR prior to admission.    Zetta Bills, MD 05/04/2011, 8:48 AM

## 2011-05-04 NOTE — Care Management Note (Signed)
    Page 1 of 1   05/04/2011     3:04:11 PM   CARE MANAGEMENT NOTE 05/04/2011  Patient:  Beavin,Alivya   Account Number:  [id number]  Date Initiated:  05/03/2011  Documentation initiated by:  Onnie Boer  Subjective/Objective Assessment:   PT WAS ADMITTED WITH SEIZURES     Action/Plan:   PROGRESSION OF CARE AND DISCHARGE PLANNING   Anticipated DC Date:  05/03/2011   Anticipated DC Plan:  HOME/SELF CARE      DC Planning Services  CM consult      Choice offered to / List presented to:             Status of service:  Completed, signed off Medicare Important Message given?   (If response is "NO", the following Medicare IM given date fields will be blank) Date Medicare IM given:   Date Additional Medicare IM given:    Discharge Disposition:  IP REHAB FACILITY  Per UR Regulation:  Reviewed for med. necessity/level of care/duration of stay  If discussed at Long Length of Stay Meetings, dates discussed:    Comments:  05/04/11 Onnie Boer, RN, BSN [phone] PT WAS TRANSFERED TO CIR  05/03/11 Onnie Boer, RN, BSN 1412 PT WAS ADMITTED WITH SEIZURES.  PTA PT WAS AT HOME WITH SELF CARE.  PT TO EVAL.  WILL F/U.

## 2011-05-04 NOTE — Discharge Summary (Signed)
Physician Discharge Summary  Patient ID: [name] MRN: [id number] DOB/AGE: 67-Jul-1947 67 y.o.  Admit date: 04/29/2011 Discharge date: 05/04/2011  Admission Diagnoses: Seizure Discharge Diagnoses:   *Seizure  CAD  ESRD  Cardiac arrest  Pulmonary edema  Encephalopathy acute  HTN (hypertension)  Nicotine abuse  Depression  GERD (gastroesophageal reflux disease)  Epilepsy  Physical deconditioning   Discharged Condition: good  Hospital Course: Pt was initially admitted with seizure.  From H&P:  Patient is a 66 year old African American female, with a past medical history of end-stage renal disease on hemodialysis, prior history of seizures apparently all antiseizure medication was stopped approximately a year ago, recent history of taka Tsubo cardiomyopathy, history of coronary artery disease, history of CVA who was brought to the ED from her hemodialysis center after she suffered a cardiac arrest following a generalized tonic-clonic seizure that was witnessed by the nursing staff there. It is unknown what the initial rhythm was, however from the nursing documentation in the ED she was never defibrillated. She was post ictal when she got to the emergency room, she is now slowly waking up, although still lethargic and drowsy. She denies any recent history of fever, headache, chest pain or shortness of breath. Her 2 daughters at bedside, they claimed that the patient lives alone and was in good health prior to this event.  Pt was initially treated in intensive care unit.  The patient was seen by cardiology who felt pt probably did not have an acute cardiac event but rather a seizure event.  Please see consultation notes.  The patient was seen by neurology and started on valproic acid.  The patient did not have any further seizure activity and was advised to continue valproic acid 750 BID.  The patient was followed by the inpatient nephrology team and had hemodialysis in the hospital.  The  patient did well.  She was seen by PT and noted to have significant physical deconditioning and unstable on her feet.  A rehab consult was requested and pt was accepted for inpatient rehab.  The patient will be sent to rehab for strengthening and further assessments will be made as to whether pt can go home.  Pt will need to follow up with her cardiologist and neurologist after discharge.      Consults: intensive care, nephrology, neurology  Significant Diagnostic Studies: ECHO (see full report in [facility])   Discharge Exam: Blood pressure 174/64, pulse 66, temperature 97.7 F (36.5 C), temperature source Oral, resp. rate 18, height 5\' 5"  (1.651 m), weight 67.042 kg (147 lb 12.8 oz), SpO2 100.00%. Head: Normocephalic, without obvious abnormality, atraumatic Eyes: negative Nose: Nares normal. Septum midline. Mucosa normal. No drainage or sinus tenderness., no discharge Throat: lips, mucosa, and tongue normal; teeth and gums normal Back: symmetric, no curvature. ROM normal. No CVA tenderness. Resp: clear to auscultation bilaterally and normal percussion bilaterally Chest wall: no tenderness Cardio: S1, S2 normal GI: soft, non-tender; bowel sounds normal; no masses,  no organomegaly Extremities: extremities normal, atraumatic, no cyanosis or edema Skin: Skin color, texture, turgor normal. No rashes or lesions Neurologic: Grossly normal  Disposition: [facility]-Skilled Nursing Facility INPATIENT REHAB CIR   Medication List  As of 05/04/2011 11:11 AM   ASK your doctor about these medications         albuterol-ipratropium 18-103 MCG/ACT inhaler   Commonly known as: COMBIVENT   Inhale 2 puffs into the lungs every 6 (six) hours as needed. For breathing      amLODipine  10 MG tablet   Commonly known as: NORVASC   Take 10 mg by mouth daily.      aspirin EC 81 MG tablet   Take 81 mg by mouth daily.      buPROPion 150 MG 12 hr tablet   Commonly known as: WELLBUTRIN SR   Take 150 mg by  mouth 2 (two) times daily.      carvedilol 25 MG tablet   Commonly known as: COREG   Take 25 mg by mouth 2 (two) times daily with a meal.      citalopram 20 MG tablet   Commonly known as: CELEXA   Take 20 mg by mouth daily.      DEXILANT 60 MG capsule   Generic drug: dexlansoprazole   Take 60 mg by mouth daily.      Fluticasone-Salmeterol 100-50 MCG/DOSE Aepb   Commonly known as: ADVAIR   Inhale 1 puff into the lungs every 12 (twelve) hours.      hydrALAZINE 25 MG tablet   Commonly known as: APRESOLINE   Take 25 mg by mouth 2 (two) times daily.      levothyroxine 88 MCG tablet   Commonly known as: SYNTHROID, LEVOTHROID   Take 88 mcg by mouth daily.      multivitamin Tabs tablet   Take 1 tablet by mouth daily.      NITROSTAT 0.4 MG SL tablet   Generic drug: nitroGLYCERIN   Place 0.4 mg under the tongue every 5 (five) minutes as needed. For chest pain.      polyethylene glycol powder powder   Commonly known as: GLYCOLAX/MIRALAX   Take 17 g by mouth daily as needed. For constipation.      pravastatin 40 MG tablet   Commonly known as: PRAVACHOL   Take 80 mg by mouth at bedtime.      sevelamer 800 MG tablet   Commonly known as: RENVELA   Take 1,600 mg by mouth 3 (three) times daily with meals.      traMADol 50 MG tablet   Commonly known as: ULTRAM   Take 50 mg by mouth every 6 (six) hours as needed. For pain      traZODone 50 MG tablet   Commonly known as: DESYREL   Take 50-100 mg by mouth at bedtime.           Follow-up Information    Follow up with [name], MD in 67 weeks.   Contact information:   [address] [place] [address] [phone]       Follow up with Lesly Dukes, MD in 3 weeks.   Contact information:   [address], [address] [place] [address] [facility] [place] [address] [phone]    Dr. Antoine Poche - Cardiology in 3 weeks.        I have spent 37 mins preparing for  the discharge of this patient to the inpatient rehabilitation facility.     Signed: Hashir Deleeuw 05/04/2011, 11:11 AM

## 2011-05-04 NOTE — Discharge Instructions (Signed)
Seizure, Adult A seizure is when the body shakes uncontrollably (convulsion). It can be a scary experience. A seizure is not a diagnosis. It is a sign that something else may be wrong with brain and/or spinal cord (central nervous system). In the Emergency Department, your condition is evaluated. The seizure is then treated. You will likely need follow-up with your caregiver. You will possibly need further testing and evaluation. Your caregiver or the specialist to whom you are referred will determine if further treatment is needed. After a seizure, you may be confused, dazed and drowsy. These problems (symptoms) often follow a seizure. Medication given to treat the seizure may also cause some of these changes. The time following a seizure is known as a refractory period. Hospital admission is seldom required unless there are other conditions present such as trauma or metabolic problems. Sometimes the seizure activity follows a fainting episode. This may have been caused by a brief drop in blood pressure. These fainting (syncopal) seizures are generally not a cause for concern.  HOME CARE INSTRUCTIONS   Follow up with your caregiver as suggested.   If any problems happen, get help right away.   Do not swim or drive until your caregiver says it is okay.  Document Released: 12/17/1999 Document Revised: 12/08/2010 Document Reviewed: 12/07/2010 [facility] Patient Information 2012 [place], [place].  Home Safety and Preventing Falls Falls are a leading cause of injury and while they affect all age groups, falls have greater short-term and long-term impact on older age groups. However, falls should not be a part of life or aging. It is possible for individuals and their families to use preventive measures to significantly decrease the likelihood that anyone, especially an older adult, will fall. There are many simple measures which can make your home safer with respect to preventing falls. The following  actions can help reduce falls among all members of your family and are especially important as you age, when your balance, lower limb strength, coordination, and eyesight may be declining. The use of preventive measures will help to reduce you and your family's risk of falls and serious medical consequences. OUTDOORS  Repair cracks and edges of walkways and driveways.   Remove high doorway thresholds and trim shrubbery on the main path into your home.   Ensure there is good outside lighting at main entrances and along main walkways.   Clear walkways of tools, rocks, debris, and clutter.   Check that handrails are not broken and are securely fastened. Both sides of steps should have handrails.   In the garage, be attentive to and clean up grease or oil spills on the cement. This can make the surface extremely slippery.   In winter, have leaves, snow, and ice cleared regularly.   Use sand or salt on walkways during winter months.  BATHROOM  Install grab bars by the toilet and in the tub and shower.   Use non-skid mats or decals in the tub or shower.   If unable to easily stand unsupported while showering, place a plastic non slip stool in the shower to sit on when needed.   Install night lights.   Keep floors dry and clean up all water on the floor immediately.   Remove soap buildup in tub or shower on a regular basis.   Secure bath mats with non-slip, double-sided rug tape.   Remove tripping hazards from the floors.  BEDROOMS  Install night lights.   Do not use oversized bedding.   Make sure  a bedside light is easy to reach.   Keep a telephone by your bedside.   Make sure that you can get in and out of your bed easily.   Have a firm chair, with side arms, to use for getting dressed.   Remove clutter from around closets.   Store clothing, bed coverings, and other household items where you can reach them comfortably.   Remove tripping hazards from the floor.  LIVING  AREAS AND STAIRWAYS  Turn on lights to avoid having to walk through dark areas.   Keep lighting uniform in each room. Place brighter lightbulbs in darker areas, including stairways.   Replace lightbulbs that burn out in stairways immediately.   Arrange furniture to provide for clear pathways.   Keep furniture in the same place.   Eliminate or tape down electrical cables in high traffic areas.   Place handrails on both sides of stairways. Use handrails when going up or down stairs.   Most falls occur on the top or bottom 3 steps.   Fix any loose handrails. Make sure handrails on both sides of the stairways are as long as the stairs.   Remove all walkway obstacles.   Coil or tape electrical cords off to the side of walking areas and out of the way. If using many extension cords, have an electrician put in a new wall outlet to reduce or eliminate them.   Make sure spills are cleaned up quickly and allow time for drying before walking on freshly cleaned floors.   Firmly attach carpet with non-skid or two-sided tape.   Keep frequently used items within easy reach.   Remove tripping hazards such as throw rugs and clutter in walkways. Never leave objects on stairs.   Get rid of throw rugs elsewhere if possible.   Eliminate uneven floor surfaces.   Make sure couches and chairs are easy to get into and out of.   Check carpeting to make sure it is firmly attached along stairs.   Make repairs to worn or loose carpet promptly.   Select a carpet pattern that does not visually hide the edge of steps.   Avoid placing throw rugs or scatter rugs at the top or bottom of stairways, or properly secure with carpet tape to prevent slippage.   Have an electrician put in a light switch at the top and bottom of the stairs.   Get light switches that glow.   Avoid the following practices: hurrying, inattention, obscured vision, carrying large loads, and wearing slip-on shoes.   Be aware of  all pets.  KITCHEN  Place items that are used frequently, such as dishes and food, within easy reach.   Keep handles on pots and pans toward the center of the stove. Use back burners when possible.   Make sure spills are cleaned up quickly and allow time for drying.   Avoid walking on wet floors.   Avoid hot utensils and knives.   Position shelves so they are not too high or low.   Place commonly used objects within easy reach.   If necessary, use a sturdy step stool with a grab bar when reaching.   Make sure electrical cables are out of the way.   Do not use floor polish or wax that makes floors slippery.  OTHER HOME FALL PREVENTION STRATEGIES  Wear low heel or rubber sole shoes that are supportive and fit well.   Wear closed toe shoes.   Know and watch for side  effects of medications. Have your caregiver or pharmacist look at all your medicines, even over-the-counter medicines. Some medicines can make you sleepy or dizzy.   Exercise regularly. Exercise makes you stronger and improves your balance and coordination.   Limit use of alcohol.   Use eyeglasses if necessary and keep them clean. Have your vision checked every year.   Organize your household in a manner that minimizes the need to walk distances when hurried, or go up and down stairs unnecessarily. For example, have a phone placed on at least each floor of your home. If possible, have a phone beside each sitting or lying area where you spend the most time at home. Keep emergency numbers posted at all phones.   Use non-skid floor wax.   When using a ladder, make sure:   The base is firm.   All ladder feet are on level ground.   The ladder is angled against the wall properly.   When climbing a ladder, face the ladder and hold the ladder rungs firmly.   If reaching, always keep your hips and body weight centered between the rails.   When using a stepladder, make sure it is fully opened and both spreaders are  firmly locked.   Do not climb a closed stepladder.   Avoid climbing beyond the second step from the top of a stepladder and the 4th rung from the top of an extension ladder.   Learn and use mobility aids as needed.   Change positions slowly. Arise slowly from sitting and lying positions. Sit on the edge of your bed before getting to your feet.   If you have a history of falls, ask someone to add color or contrast paint or tape to grab bars and handrails in your home.   If you have a history of falls, ask someone to place contrasting color strips on first and last steps.   Install an electrical emergency response system if you need one, and know how to use it.   If you have a medical or other condition that causes you to have limited physical strength, it is important that you reach out to family and friends for occasional help.  FOR CHILDREN:  If young children are in the home, use safety gates. At the top of stairs use screw-mounted gates; use pressure-mounted gates for the bottom of the stairs and doorways between rooms.   Young children should be taught to descend stairs on their stomachs, feet first, and later using the handrail.   Keep drawers fully closed to prevent them from being climbed on or pulled out entirely.   Move chairs, cribs, beds and other furniture away from windows.   Consider installing window guards on windows ground floor and up, unless they are emergency fire exits. Make sure they have easy release mechanisms.   Consider installing special locks that only allow the window to be opened to a certain height.   Never rely on window screens to prevent falls.   Never leave babies alone on changing tables, beds or sofas. Use a changing table that has a restraining strap.   When a child can pull to a standing position, the crib mattress should be adjusted to its lowest position. There should be at least 26 inches between the top rails of the crib drop side and the  mattress. Toys, bumper pads, and other objects that can be used as steps to climb out should be removed from the crib.   On bunk  beds never allow a child under age 73 to sleep on the top bunk. For older children, if the upper bunk is not against a wall, use guard rails on both sides. No matter how old a child is, keep the guard rails in place on the top bunk since children roll during sleep. Do not permit horseplay on bunks.   Grass and soil surfaces beneath backyard playground equipment should be replaced with hardwood chips, shredded wood mulch, sand, pea gravel, rubber, crushed stone, or another safer material at depths of at least 9 to 12 inches.   When riding bikes or using skates, skateboards, skis, or snowboards, require children to wear helmets. Look for those that have stickers stating that they meet or exceed safety standards.   Vertical posts or pickets in deck, balcony, and stairway railings should be no more than 3 1/2 inches apart if a young baby will have access to the area. The space between horizontal rails or bars, and between the floor and the first horizontal rail or bar, should be no more than 3 1/2 inches.  Document Released: 12/09/2001 Document Revised: 12/08/2010 Document Reviewed: 10/08/2008 [facility] Patient Information 2012 [place], [place].

## 2011-05-04 NOTE — PMR Pre-admission (Signed)
PMR Admission Coordinator Pre-Admission Assessment  Patient: [name] is an 66 y.o., female MRN: [id number] DOB: 1945-11-02 Height: 5\' 5"  (165.1 cm) Weight: 67.042 kg (147 lb 12.8 oz)  Insurance Information HMO:     PPO:      PCP:      IPA:      80/20: yes     OTHER: no HMO PRIMARY: Medicare      Policy#: [id number]  Subscriber: pt CM Name:       Phone#:      Fax#:  Pre-Cert#:       Employer:  Benefits:  Phone #: vision share     Name: 05/03/11 Eff. Date: a 10/02/05 b 06/03/06     Deduct: #1184      Out of Pocket Max: none      Life Max: none CIR: 100%      SNF: 16 full days LBD 03/02/11 Outpatient: 80%     Co-Pay: 20% Home Health: 100%      Co-Pay: none DME: 80%     Co-Pay: 20% Providers: pt choice   SECONDARY: BCBS [place]      Policy#: [id number]      Subscriber: pt No auth required with medicare primary  Emergency Contact Information Contact Information    Name Relation Home Work Mobile   Edwards,Joy Daughter [phone]     Robinson,Denice Daughter [phone]       Current Medical History  Patient Admitting Diagnosis: Seizure, likely anoxic encephalopathy  History of Present Illness: 66 year old right handed African American female with history of end-stage renal disease hemodialysis as well as prior history of seizure with by report all anti-seizure medications were stopped approximately one year ago, recent history of taka tsubo cardiomyopathy as well as stroke. Patient independent prior to admission living alone. Admitted 04/29/11 after cardiac arrest followed by seizure witnessed by nursing staff during her dialysis. Cranial CT scan negative. She is a known DO NOT RESUSCITATE. Neurology consulted placed on Depakote 750 mg every 12 hours. No further seizure activity noted. She continues with dialysis as directed.     Past Medical History  Past Medical History  Diagnosis Date  . CAD (coronary artery disease)     stent to RCA  . CVA (cerebral infarction) 2003    no  apparent residual  . Renal failure     related to hypertension  . Hypothyroidism   . PVD (peripheral vascular disease)   . Hyperlipidemia   . Positive PPD     completed rifampin  . Diastolic congestive heart failure   . Renal insufficiency   . Hypertension   . COPD (chronic obstructive pulmonary disease) t  . Cancer     clear cell cancer, kidney  . Complication of anesthesia 12/2010    pt is very confused, with AMS with anesthesia    Family History  family history includes Coronary artery disease in her sister; Dementia in her mother; Heart disease in her father; and Hypertension in her mother.  Prior Rehab/Hospitalizations: none Current Medications  Current facility-administered medications:acetaminophen (TYLENOL) suppository 650 mg, 650 mg, Rectal, Q6H PRN, Maretta Bees, MD;  acetaminophen (TYLENOL) tablet 650 mg, 650 mg, Oral, Q6H PRN, Maretta Bees, MD, 650 mg at 04/29/11 1749;  albuterol (PROVENTIL) (5 MG/ML) 0.5% nebulizer solution 2.5 mg, 2.5 mg, Nebulization, Q2H PRN, Maretta Bees, MD amLODipine (NORVASC) tablet 10 mg, 10 mg, Oral, Daily, Maretta Bees, MD, 10 mg at 05/04/11 0923;  aspirin EC tablet 81 mg, 81 mg,  Oral, Daily, Maretta Bees, MD, 81 mg at 05/04/11 1610;  atorvastatin (LIPITOR) tablet 10 mg, 10 mg, Oral, q1800, Herby Abraham, PHARMD, 10 mg at 05/03/11 1712;  carvedilol (COREG) tablet 25 mg, 25 mg, Oral, BID WC, Maretta Bees, MD, 25 mg at 05/04/11 0755 citalopram (CELEXA) tablet 20 mg, 20 mg, Oral, Daily, Maretta Bees, MD, 20 mg at 05/04/11 9604;  divalproex (DEPAKOTE) DR tablet 750 mg, 750 mg, Oral, Q12H, Ulice Dash, PA, 750 mg at 05/04/11 5409;  feeding supplement (RESOURCE BREEZE) liquid 1 Container, 1 Container, Oral, TID BM, Hettie Holstein, RD, 1 Container at 05/02/11 2000 Fluticasone-Salmeterol (ADVAIR) 100-50 MCG/DOSE inhaler 1 puff, 1 puff, Inhalation, Q12H, Maretta Bees, MD, 1 puff at 05/04/11 0931;   hydrALAZINE (APRESOLINE) injection 10 mg, 10 mg, Intravenous, Q6H PRN, Jinger Neighbors, NP, 10 mg at 05/02/11 0340;  hydrALAZINE (APRESOLINE) tablet 25 mg, 25 mg, Oral, Q8H, Lonia Blood, MD, 25 mg at 05/04/11 0636 levothyroxine (SYNTHROID, LEVOTHROID) tablet 88 mcg, 88 mcg, Oral, Daily, Maretta Bees, MD, 88 mcg at 05/04/11 8119;  LORazepam (ATIVAN) injection 1 mg, 1 mg, Intravenous, Q6H PRN, Calvert Cantor, MD, 1 mg at 05/01/11 1478;  multivitamin (RENA-VIT) tablet 1 tablet, 1 tablet, Oral, Daily, Maretta Bees, MD, 1 tablet at 05/04/11 2956;  nitroGLYCERIN (NITROSTAT) SL tablet 0.4 mg, 0.4 mg, Sublingual, Q5 min PRN, Maretta Bees, MD ondansetron (ZOFRAN) injection 4 mg, 4 mg, Intravenous, Q6H PRN, Maretta Bees, MD;  ondansetron (ZOFRAN) tablet 4 mg, 4 mg, Oral, Q6H PRN, Maretta Bees, MD;  pantoprazole (PROTONIX) EC tablet 40 mg, 40 mg, Oral, Q1200, Maretta Bees, MD, 40 mg at 05/03/11 1347;  paricalcitol (ZEMPLAR) injection 7 mcg, 7 mcg, Intravenous, 3 times weekly, Gerome Apley, PA, 7 mcg at 05/03/11 1010 polyethylene glycol (MIRALAX / GLYCOLAX) packet 17 g, 17 g, Oral, Daily PRN, Maretta Bees, MD, 17 g at 05/02/11 1208;  sevelamer (RENVELA) tablet 1,600 mg, 1,600 mg, Oral, TID WC, Maretta Bees, MD, 1,600 mg at 05/04/11 0636;  sodium chloride 0.9 % injection 3 mL, 3 mL, Intravenous, Q12H, Maretta Bees, MD, 3 mL at 05/03/11 2219  Patients Current Diet: General  Precautions / Restrictions Precautions Precautions: Fall Restrictions Weight Bearing Restrictions: No   Prior Activity Level Limited Community (1-2x/wk): op dialysis 3 times per week m/w/f otherwise homebound Journalist, newspaper / Equipment Home Assistive Devices/Equipment: Medical laboratory scientific officer (specify quad or straight) Home Adaptive Equipment: Walker - four wheeled;Straight cane;Shower chair without back  Prior Functional Level Prior Function Level of Independence: Independent with assistive  device(s) Able to Take Stairs?: Yes Driving: No Vocation: On disability (Retired Charity fundraiser)  Current Functional Level Cognition  Arousal/Alertness: Awake/alert Overall Cognitive Status: Impaired Memory Deficits: Noted pt attempted to walk back to wrong room; needed cues for room number Orientation Level: Oriented X4 Safety/Judgement: Impulsive Cognition - Other Comments: Decreased awareness of balance defecits and fall risk    Sensation       Coordination       ADLs       Mobility  Bed Mobility: Supine to Sit;Sitting - Scoot to Edge of Bed Supine to Sit: 5: Supervision Sitting - Scoot to Edge of Bed: 6: Modified independent (Device/Increase time)    Transfers  Transfers: Sit to Stand;Stand to Sit Sit to Stand: 4: Min guard;From bed;With armrests;From chair/3-in-1;From toilet;With upper extremity assist Stand to Sit: 4: Min guard;With armrests;To toilet;To chair/3-in-1;To bed    Ambulation / Gait /  Stairs / Psychologist, prison and probation services  Ambulation/Gait Ambulation/Gait Assistance: 4: Min Environmental consultant (Feet): 26 Feet Assistive device: Rolling walker Ambulation/Gait Assistance Details: ambulates quickly with decreased safety awarenss; assist to negotiate obstacles with RW; no LOB today during ambulation; head down during gait Gait Pattern: Decreased step length - right;Decreased step length - left;Decreased stride length Gait velocity: slow    Posture / Balance Dynamic Standing Balance Dynamic Standing - Comments: following the reciprocal stepping task of the berg balance test pt standing and had 2 moderate losses of balance needing to steady herself with the bed and therapist Berg Balance Test Sit to Stand: Able to stand  independently using hands Standing Unsupported: Able to stand safely 2 minutes Sitting with Back Unsupported but Feet Supported on Floor or Stool: Able to sit safely and securely 2 minutes Stand to Sit: Controls descent by using hands Transfers: Able to  transfer safely, definite need of hands Standing Unsupported with Eyes Closed: Able to stand 10 seconds with supervision Standing Ubsupported with Feet Together: Able to place feet together independently and stand for 1 minute with supervision From Standing, Reach Forward with Outstretched Arm: Can reach forward >5 cm safely (2") From Standing Position, Pick up Object from Floor: Able to pick up shoe, needs supervision From Standing Position, Turn to Look Behind Over each Shoulder: Turn sideways only but maintains balance Turn 360 Degrees: Able to turn 360 degrees safely but slowly Standing Unsupported, Alternately Place Feet on Step/Stool: Able to complete >2 steps/needs minimal assist Standing Unsupported, One Foot in Front: Able to take small step independently and hold 30 seconds Standing on One Leg: Able to lift leg independently and hold equal to or more than 3 seconds Total Score: 37      Previous Home Environment Living Arrangements: Alone Lives With: Alone Type of Home: Apartment Home Layout: One level Home Access: Level entry Bathroom Shower/Tub: Health visitor: Handicapped height Bathroom Accessibility: Yes How Accessible: Accessible via walker Home Care Services: No Additional Comments: Pt very much wants to dc home, Daughters worry about pt safety; Need to find out how much assist daughters can give pt once she goes home; can pt stay with a doughter, or vice versa?  Discharge Living Setting Plans for Discharge Living Setting: Lives with (comment) (Plan to go live with one of her daughters) Type of Home at Discharge: House Discharge Home Layout: One level Discharge Home Access: Stairs to enter Discharge Bathroom Shower/Tub:  (tba) Discharge Bathroom Toilet:  (tba) Do you have any problems obtaining your medications?: No  Social/Family/Support Systems Patient Roles: Parent Contact Information: Lysbeth Penner, daughter/poa Anticipated Caregiver: Judeth Horn, daughters Anticipated Caregiver's Contact Information: Lysbeth Penner. [phone] Ability/Limitations of Caregiver: JOy does not work, Angelique Blonder works days at [employer]: 24/7 Discharge Plan Discussed with Primary Caregiver: Yes Is Caregiver In Agreement with Plan?: Yes Does Caregiver/Family have Issues with Lodging/Transportation while Pt is in Rehab?: No  Goals/Additional Needs Patient/Family Goal for Rehab: Mod I to supervision with PT, OT, and SLP Expected length of stay: ELOS 10 to 14 days Special Service Needs: outpt dialysis M, W, F rides [facility] bus Pt/Family Agrees to Admission and willing to participate: Yes Program Orientation Provided & Reviewed with Pt/Caregiver Including Roles  & Responsibilities: Yes  Patient Condition: This patient's condition remains as documented in the Consult dated 05/03/11, in which the Rehabilitation Physician determined and documented that the patient's condition is appropriate for intensive rehabilitative care in an inpatient rehabilitation facility.  Preadmission Screen Completed By:  Clois Dupes, 05/04/2011 11:31 AM ______________________________________________________________________   Discussed status with Dr. Wynn Banker on 05/04/11 at  1130 and received telephone approval for admission today.  Admission Coordinator:  Clois Dupes, time 1610 05/04/11

## 2011-05-04 NOTE — Progress Notes (Signed)
Received patient from 3700.  Patient Alert and Oriented to Person, Place, Time and Situation.  BP 199/74; HR 60; Temp 98.6; on Room Air with O2 sats 100%. After 40 minutes BP 179/63; spoke with PA Love, waited another 60 minutes as suggested by PA Love, BP 126/60. LUA dialysis graft positive for thrill and bruit.  Skin WDL.   Discussed with patient our safe policy; explained use of call bell for any needs;  Discussed with patient, importance of calling for assistance to ambulate to bathroom.  Patient verbalized understanding.

## 2011-05-05 ENCOUNTER — Inpatient Hospital Stay (HOSPITAL_COMMUNITY): Payer: Medicare Other

## 2011-05-05 DIAGNOSIS — G931 Anoxic brain damage, not elsewhere classified: Secondary | ICD-10-CM

## 2011-05-05 DIAGNOSIS — Z5189 Encounter for other specified aftercare: Secondary | ICD-10-CM

## 2011-05-05 LAB — CBC
HCT: 29.8 % — ABNORMAL LOW (ref 36.0–46.0)
Hemoglobin: 9.8 g/dL — ABNORMAL LOW (ref 12.0–15.0)
MCH: 25.6 pg — ABNORMAL LOW (ref 26.0–34.0)
MCHC: 32.9 g/dL (ref 30.0–36.0)
MCV: 77.8 fL — ABNORMAL LOW (ref 78.0–100.0)
RDW: 16.7 % — ABNORMAL HIGH (ref 11.5–15.5)

## 2011-05-05 LAB — RENAL FUNCTION PANEL
CO2: 26 mEq/L (ref 19–32)
Calcium: 10.6 mg/dL — ABNORMAL HIGH (ref 8.4–10.5)
Creatinine, Ser: 8 mg/dL — ABNORMAL HIGH (ref 0.50–1.10)
Glucose, Bld: 96 mg/dL (ref 70–99)
Phosphorus: 5.8 mg/dL — ABNORMAL HIGH (ref 2.3–4.6)

## 2011-05-05 LAB — GLUCOSE, CAPILLARY
Glucose-Capillary: 66 mg/dL — ABNORMAL LOW (ref 70–99)
Glucose-Capillary: 149 mg/dL — ABNORMAL HIGH (ref 70–99)

## 2011-05-05 MED ORDER — SEVELAMER CARBONATE 800 MG PO TABS
2400.0000 mg | ORAL_TABLET | Freq: Three times a day (TID) | ORAL | Status: DC
  Administered 2011-05-05 – 2011-05-16 (×26): 2400 mg via ORAL
  Filled 2011-05-05 (×38): qty 3

## 2011-05-05 MED ORDER — PARICALCITOL 5 MCG/ML IV SOLN
INTRAVENOUS | Status: AC
  Administered 2011-05-05: 7 ug via INTRAVENOUS
  Filled 2011-05-05: qty 2

## 2011-05-05 MED ORDER — DIPHENHYDRAMINE HCL 25 MG PO CAPS
25.0000 mg | ORAL_CAPSULE | ORAL | Status: DC
  Administered 2011-05-05 – 2011-05-15 (×4): 25 mg via ORAL
  Filled 2011-05-05 (×7): qty 1

## 2011-05-05 MED ORDER — ACYCLOVIR 200 MG PO CAPS
400.0000 mg | ORAL_CAPSULE | Freq: Every day | ORAL | Status: AC
  Administered 2011-05-05 – 2011-05-07 (×8): 400 mg via ORAL
  Filled 2011-05-05 (×8): qty 2

## 2011-05-05 MED ORDER — ACYCLOVIR 400 MG PO TABS
400.0000 mg | ORAL_TABLET | Freq: Every day | ORAL | Status: DC
  Administered 2011-05-05 (×2): 400 mg via ORAL
  Filled 2011-05-05 (×4): qty 1

## 2011-05-05 NOTE — Evaluation (Signed)
Physical Therapy Assessment and Plan  Patient Details  Name: [name] MRN: [id number] Date of Birth: Dec 04, 1945  PT Diagnosis: Abnormality of gait and Difficulty walking Rehab Potential: Good ELOS: < 1 week   Today's Date: 05/05/2011 Time: 0802-0900 Time Calculation (min): 58 min  Problem List:  Patient Active Problem List  Diagnoses  . OTHER AND UNSPECIFIED HYPERLIPIDEMIA  . TOBACCO USER  . Essential hypertension, malignant  . CAD  . Chronic diastolic heart failure  . CVA  . PVD  . ESRD  . LEG PAIN  . Nonspecific abnormal electrocardiogram (ECG) (EKG)  . Cardiac arrest  . Seizure  . Pulmonary edema  . Encephalopathy acute  . HTN (hypertension)  . Nicotine abuse  . Depression  . GERD (gastroesophageal reflux disease)  . Epilepsy  . Physical deconditioning    Past Medical History:  Past Medical History  Diagnosis Date  . CAD (coronary artery disease)     stent to RCA  . CVA (cerebral infarction) 2003    no apparent residual  . Renal failure     related to hypertension  . Hypothyroidism   . PVD (peripheral vascular disease)   . Hyperlipidemia   . Positive PPD     completed rifampin  . Diastolic congestive heart failure   . Renal insufficiency   . Hypertension   . COPD (chronic obstructive pulmonary disease) t  . Cancer     clear cell cancer, kidney  . Complication of anesthesia 12/2010    pt is very confused, with AMS with anesthesia   Past Surgical History:  Past Surgical History  Procedure Date  . Abdominal hysterectomy   . Cholecystectomy   . D&cs   . Right ankle repair   . Left nephrectomy   . Vascular surgery 11/2010    graft inserted to left arm    Assessment & Plan Clinical Impression: HPI: [name] is an 66 y.o. right handed African American female with history of end-stage renal disease on hemodialysis, dementia, prior history of seizure and per report all anti-seizure medications were stopped approximately one year ago.   Admitted 04/29/11 after cardiac arrest followed by seizure witnessed by nursing staff during her dialysis. Patient required 5 minutes of chest compressions. Patient with another seizure activity in ED. Patient with history of cardiac cath with stent 02/2011 and Dr. Antoine Bradley consulted for input. Cardiac enzymes negative. Cranial CT scan negative. Neurology consulted and Depakote initiated for treatment.   2D echo done revealing EF 50-55%.  Patient without cardiac etiology and no further work up indicated. Has been seizure free.  Lethargy and confusion is resolving. Has been seizure free and Depakote 750 mg bid recommended with F/U with Dr. Anne Bradley past discharge.  Therapies initiated and balance deficits noted.     Patient transferred to CIR on 05/04/2011 .   Patient currently requires  Up to min with mobility secondary to decreased cardiorespiratoy endurance and decreased standing balance, decreased postural control and decreased balance strategies.  Prior to hospitalization, patient was I without a device but uses cane rarely in community to walk dogs with mobility and lived with1i level apt .She plans to d/c to her dtr's 1 level house with 1 STE- dtr is a Runner, broadcasting/film/video and works days so intermittent assist available only.  Patient will benefit from skilled PT intervention to maximize safe functional mobility, minimize fall risk and decrease caregiver burden for planned discharge home with intermittent assist.  Anticipate patient will benefit from follow up OP at discharge. Vs  HHPT TBD  PT - End of Session Activity Tolerance: Tolerates 30+ min activity with multiple rests Endurance Deficit: Yes PT Assessment Rehab Potential: Good Barriers to Discharge: None PT Plan PT Frequency: 2-3 X/day, 60-90 minutes Estimated Length of Stay: < 1 week PT Treatment/Interventions: Ambulation/gait training;Functional mobility training;Balance/vestibular training;Community reintegration;Patient/family education;DME/adaptive  equipment instruction;Therapeutic Activities;Therapeutic Exercise PT Recommendation Follow Up Recommendations: Outpatient PT Equipment Recommended: None recommended by PT  PT Evaluation Precautions/Restrictions Precautions Precaution Comments: siezure precautions Vital Signs Therapy Vitals Temp: 98.7 F (37.1 C) Temp src: Oral Pulse Rate: 67  Resp: 18  BP: 176/77 mmHg Patient Position, if appropriate: Lying Oxygen Therapy SpO2: 97 % O2 Device: None (Room air) Pain Pain Assessment Pain Assessment: No/denies pain Home Living/Prior Functioning Home Living Available Help at Discharge:  (plan to d/c to daughters home, 1 level 1 STE) Home Adaptive Equipment: Walker - rolling;Straight cane Prior Function Level of Independence: Independent with transfers Able to Take Stairs?: Yes Driving: No (bus to dialysis) Comments: TV Vision/Perception  Vision - History Baseline Vision: Wears glasses only for reading Perception Perception: Within Functional Limits Praxis Praxis: Intact  Cognition  followed commands on eval, reports STM deficits prior  Sensation Sensation Light Touch: Appears Intact Proprioception: Appears Intact Motor  Motor Motor: Within Functional Limits     Locomotion  Ambulation Ambulation/Gait Assistance: 4: Min assist  Trunk/Postural Assessment WFL    Balance Standardized Balance Assessment Standardized Balance Assessment: Dynamic Gait Index Dynamic Gait Index Level Surface: Mild Impairment Change in Gait Speed: Moderate Impairment Gait with Horizontal Head Turns: Moderate Impairment Gait with Vertical Head Turns: Moderate Impairment Gait and Pivot Turn: Moderate Impairment Step Over Obstacle: Moderate Impairment Step Around Obstacles: Mild Impairment Steps: Moderate Impairment Total Score: 10  Extremity Assessment      RLE Assessment RLE Assessment: Within Functional Limits LLE Assessment LLE Assessment: Within Functional Limits  See FIM  for current functional status Refer to Care Plan for Long Term Goals  Recommendations for other services: None  Discharge Criteria: Patient will be discharged from PT if patient refuses treatment 3 consecutive times without medical reason, if treatment goals not met, if there is a change in medical status, if patient makes no progress towards goals or if patient is discharged from hospital.  The above assessment, treatment plan, treatment alternatives and goals were discussed and mutually agreed upon: by patient  but Skilled intervention: BP decreased with activity 157/70, pt had 1 c/o dizziness during session  And reports fall 1x month while walking in the house but is able to get back up with a struggle, per her report. Pt increased fall risk as noted by DGI 10 (<19 increased fall risk). Gait pattern abnormality- wide BOS, increased upper trunk lateral sway and with fatigue sways more to L, 1 LOB on a turn requiring assist to prevent fall.  Gait training with challenges, pt more resistant to reach to L And LOB posteriorly with challenges, decreased hip strategy. Nustep for cardiorespiratory tolerance- level 4 10 min total with rest and c/o fatigue, goal MET 2, HR 116, O2 100% Blood sugar low this am also but 78 before PT Gait velocity 17.2 10 m walk test velocity 1.9 ft/sec  Michaelene Song 05/05/2011, 9:05 AM

## 2011-05-05 NOTE — Care Management Note (Addendum)
[facility]patient [facility] Individual Statement of Services  Patient Name:  [name]  Date:  05/05/2011  Welcome to the [facility]patient [facility].  Our goal is to provide you with an individualized program based on your diagnosis and situation, designed to meet your specific needs.  With this comprehensive rehabilitation program, you will be expected to participate in at least 3 hours of rehabilitation therapies Monday-Friday, with modified therapy programming on the weekends.  Your rehabilitation program will include the following services:  Physical Therapy (PT), Occupational Therapy (OT), 24 hour per day rehabilitation nursing, Therapeutic Recreaction (TR), Case Management (RN and Child psychotherapist), Rehabilitation Medicine, Nutrition Services and Pharmacy Services  Weekly team conferences will be held on  Tuesday  to discuss your progress.  Your RN Case Designer, television/film set will talk with you frequently to get your input and to update you on team discussions.  Team conferences with you and your family in attendance may also be held.  Expected length of stay: @7  days Overall anticipated outcome: Supervision-Modified Independent  Depending on your progress and recovery, your program may change.  Your RN Case Estate agent will coordinate services and will keep you informed of any changes.  Your RN Sports coach and SW names and contact numbers are listed  below.  The following services may also be recommended but are not provided by the Inpatient Rehabilitation Center:   Driving Evaluations  Home Health Rehabiltiation Services  Outpatient Rehabilitatation [facility]  Vocational Rehabilitation   Arrangements will be made to provide these services after discharge if needed.  Arrangements include referral to agencies that provide these services.  Your insurance has been verified to be:  Medicare + BCBS Your primary doctor is:  Dr. Dorothyann Peng  Pertinent  information will be shared with your doctor and your insurance company.  Case Manager: Melanee Spry, [facility] [phone]  Social Worker:  Amada Jupiter, [place] [phone]  Information discussed with pt [& daughter, [name], by phone] and copy given to patient by: Brock Ra, 05/05/2011, 3:42 PM

## 2011-05-05 NOTE — Progress Notes (Signed)
Patient ID: [name], female   DOB: 07-01-1945, 66 y.o.   MRN: [id number]    [facility] Progress Note    Subjective:   Reports to be feeling well except for some right thigh skin irritation "like the shingles"   Objective:   BP 176/77  Pulse 67  Temp(Src) 98.7 F (37.1 C) (Oral)  Resp 18  Wt 64.5 kg (142 lb 3.2 oz)  SpO2 99%  Physical Exam: OZH:YQMVHQIONGE sitting up in wheelchair- RT assessing sPo2. XBM:WUXLK RRR, Normal S1 with loud S2 Resp:CTA bilaterally, no rales/rhonchi GMW:NUUV, flat, non-tender, BS normal Ext:No LE edema.   Labs: BMET  Lab 05/03/11 0923 05/02/11 0650 05/02/11 0423 05/01/11 0500 04/30/11 0400 04/29/11 1540 04/29/11 1132 04/29/11 1112  NA 127* 133* 120* 134* 133* -- 139 137  K 4.3 3.8 3.6 4.2 4.3 -- 3.6 3.7  CL 89* 95* 84* 96 94* -- 99 97  CO2 28 28 25 23 26  -- -- 27  GLUCOSE 76 96 875* 61* 59* -- 116* 112*  BUN 37* 19 16 33* 22 -- 12 13  CREATININE 7.43* 5.24* 4.59* 7.92* 6.03* 5.04* 4.50* --  ALB -- -- -- -- -- -- -- --  CALCIUM 9.6 10.1 9.0 9.6 10.0 -- -- 9.4  PHOS 5.9* -- -- 5.7* -- -- -- --   CBC  Lab 05/03/11 0922 05/01/11 0500 04/30/11 0415 04/29/11 1540 04/29/11 1112  WBC 3.9* 4.0 7.8 6.9 --  NEUTROABS -- -- -- -- 4.4  HGB 9.8* 10.1* 11.2* 10.6* --  HCT 29.8* 31.0* 35.0* 33.5* --  MCV 78.6 77.7* 81.0 80.3 --  PLT 133* 111* 109* 126* --    @IMGRELPRIORS @ Medications:      . amLODipine  10 mg Oral Daily  . aspirin EC  81 mg Oral Daily  . atorvastatin  10 mg Oral q1800  . carvedilol  25 mg Oral BID WC  . citalopram  20 mg Oral Daily  . divalproex  750 mg Oral Q12H  . feeding supplement  1 Container Oral TID BM  . Fluticasone-Salmeterol  1 puff Inhalation Q12H  . hydrALAZINE  25 mg Oral Q8H  . levothyroxine  88 mcg Oral Daily  . multivitamin  1 tablet Oral QHS  . pantoprazole  40 mg Oral Q1200  . paricalcitol  7 mcg Intravenous Q M,W,F-HD  . sevelamer  1,600 mg Oral TID WC     Assessment/ Plan:     1. Seizures - Seizure-free since admission and now mental status back to baseline, restarted back on depakote by neurology- Currently in CIR for retraining to ascertain that she is safe enough to go home 2. ESRD - HD on MWF @ [facility]; Plan for HD today- no acute volume needs identified. 3. Hypertension/volume - monitor blood pressures- currently fairly controlled. Remains on hydralazine, amlodipine and carvedilol. 4. Anemia - hemoglobin currently at goal, remains off Epogen/off iron. 5. Metabolic bone disease - hyperphosphatemic- increase renagel dose 6. Malnutrition - last Alb 3.4, recently on Megace. 7. Depression - on Celexa.  8. DNR- had out-of-hospital DNR prior to admission.   Zetta Bills, MD 05/05/2011, 9:42 AM

## 2011-05-05 NOTE — Progress Notes (Signed)
Social Work  Social Work Assessment and Plan  Patient Details  Name: [name] MRN: [id number] Date of Birth: [birth year]  Today's Date: 05/05/2011  Problem List:  Patient Active Problem List  Diagnoses  . OTHER AND UNSPECIFIED HYPERLIPIDEMIA  . TOBACCO USER  . Essential hypertension, malignant  . CAD  . Chronic diastolic heart failure  . CVA  . PVD  . ESRD  . LEG PAIN  . Nonspecific abnormal electrocardiogram (ECG) (EKG)  . Cardiac arrest  . Seizure  . Pulmonary edema  . Encephalopathy acute  . HTN (hypertension)  . Nicotine abuse  . Depression  . GERD (gastroesophageal reflux disease)  . Epilepsy  . Physical deconditioning   Past Medical History:  Past Medical History  Diagnosis Date  . CAD (coronary artery disease)     stent to RCA  . CVA (cerebral infarction) 2003    no apparent residual  . Renal failure     related to hypertension  . Hypothyroidism   . PVD (peripheral vascular disease)   . Hyperlipidemia   . Positive PPD     completed rifampin  . Diastolic congestive heart failure   . Renal insufficiency   . Hypertension   . COPD (chronic obstructive pulmonary disease) t  . Cancer     clear cell cancer, kidney  . Complication of anesthesia 12/2010    pt is very confused, with AMS with anesthesia   Past Surgical History:  Past Surgical History  Procedure Date  . Abdominal hysterectomy   . Cholecystectomy   . D&cs   . Right ankle repair   . Left nephrectomy   . Vascular surgery 11/2010    graft inserted to left arm   Social History:  reports that she has been smoking Cigarettes.  She has a 106 pack-year smoking history. She does not have any smokeless tobacco history on file. She reports that she does not drink alcohol or use illicit drugs.  Family / Support Systems Marital Status: Separated How Long?: 3 years - he lives in [place] Patient Roles: Parent Spouse/Significant Other: NA Children: daughters:  Lysbeth Penner @ (C) [phone] and  Mallie Darting @ (C) [phone] - both local Anticipated Caregiver: both daughters to assist Ability/Limitations of Caregiver: Denice works days Printmaker at [employer]) but Engineer, maintenance not working Medical laboratory scientific officer: 24/7 Family Dynamics: Both daughters very supportive and concerned about how pt was managing at home PTA - willing to provide any assistance needed  Social History Preferred language: English Religion: Christian Cultural Background: NA Education: BSN Read: Yes Write: Yes Employment Status: Disabled Date Retired/Disabled/Unemployed: stopped working approx 8 years ago when she began HD Age Retired: 58  Fish farm manager Issues: none Guardian/Conservator: none   Abuse/Neglect Physical Abuse: Denies Verbal Abuse: Denies Sexual Abuse: Denies Exploitation of patient/patient's resources: Denies Self-Neglect: Denies  Emotional Status Pt's affect, behavior adn adjustment status: Pt speaks very directly with SW when reporting about having had a seizure at HD and little recall of events.  States she is unsure why she is on CIR.  Appears a little frustrated with being here, but denies any significant emotional distress.  Not offering much casual discussion and it appears difficult to build much rapport.  Pt denies any s/s of depression or anxiety and screen deferred at this time until able to discuss more with daughters and build rapport. Recent Psychosocial Issues: Pt moved to [facility] to be "closer to my daughters". Daughters report pt with declining cognition and growing concern about her ability  to continue to live alone, however, had not been able to convince pt to move in with one of them. Pyschiatric History: None Substance Abuse History: None  Patient / Family Perceptions, Expectations & Goals Pt/Family understanding of illness & functional limitations: Pt with limited understanding of medical issues and reasons for needing CIR therapy.  Daughters with good  understanding and very pleased that she is here. Premorbid pt/family roles/activities: Daughters were trying to provide more support to pt, yet meeting some resistance from pt.  Pt continued to drive, manage meds, etc despite daughter's request that she allow them to help Anticipated changes in roles/activities/participation: Daughters hopeful this hospitalization will encourage more acceptance from pt to allow them to assist Pt/family expectations/goals: Daughters' goals are for pt to move in with one of them where they can provide close supervision to her.  Pt simply focused on getting home ASAP  [employer] Agencies: Other (Comment) (HD at [facility] and uses [facility]) Premorbid Home Care/DME Agencies: None Transportation available at discharge: yes Resource referrals recommended: Support group (specify)  Discharge Planning Living Arrangements: Children Support Systems: Children Type of Residence: Private residence Insurance Resources: Administrator (specify) (and Acupuncturist) Financial Resources: Restaurant manager, fast food Screen Referred: No Living Expenses: Psychologist, sport and exercise Management: Patient Do you have any problems obtaining your medications?: No Home Management: patient PTA Patient/Family Preliminary Plans: Pt reports she will likely go to home of one of her daughters - plan still being addressed Social Work Anticipated Follow Up Needs: HH/OP Expected length of stay: 7 days  Clinical Impression Relatively healthy appearing woman sitting in w/c and answering questions appropriately but with brief responses. Appears irritated with questions at times.  States she is unsure full extent of medical issues and why she is in hospital still/ on CIR.  Does report that daughters are very supportive and "want me to stay with them".  No obvious s/s of depression / anxiety other than some irritability.  Daughters report pt with cognitive decline but resistant to their  recommendations (i.e. No longer driving, letting them assist with medication management).  Team anticipates short LOS.  Will assist with psychosocial support and d/c planning.  Amada Jupiter  Jessiah Steinhart 05/05/2011, 3:36 PM

## 2011-05-05 NOTE — Evaluation (Signed)
Occupational Therapy Assessment and Plan  Patient Details  Name: [name] MRN: [id number] Date of Birth: 08/01/1945  OT Diagnosis: altered mental status, cognitive deficits and muscle weakness (generalized) Rehab Potential: Rehab Potential: Good ELOS:     Today's Date: 05/05/2011 Time: 1500-1530 Time Calculation (min): 30 min  Problem List:  Patient Active Problem List  Diagnoses  . OTHER AND UNSPECIFIED HYPERLIPIDEMIA  . TOBACCO USER  . Essential hypertension, malignant  . CAD  . Chronic diastolic heart failure  . CVA  . PVD  . ESRD  . LEG PAIN  . Nonspecific abnormal electrocardiogram (ECG) (EKG)  . Cardiac arrest  . Seizure  . Pulmonary edema  . Encephalopathy acute  . HTN (hypertension)  . Nicotine abuse  . Depression  . GERD (gastroesophageal reflux disease)  . Epilepsy  . Physical deconditioning    Past Medical History:  Past Medical History  Diagnosis Date  . CAD (coronary artery disease)     stent to RCA  . CVA (cerebral infarction) 2003    no apparent residual  . Renal failure     related to hypertension  . Hypothyroidism   . PVD (peripheral vascular disease)   . Hyperlipidemia   . Positive PPD     completed rifampin  . Diastolic congestive heart failure   . Renal insufficiency   . Hypertension   . COPD (chronic obstructive pulmonary disease) t  . Cancer     clear cell cancer, kidney  . Complication of anesthesia 12/2010    pt is very confused, with AMS with anesthesia   Past Surgical History:  Past Surgical History  Procedure Date  . Abdominal hysterectomy   . Cholecystectomy   . D&cs   . Right ankle repair   . Left nephrectomy   . Vascular surgery 11/2010    graft inserted to left arm    Assessment & Plan Clinical Impression: Patient is a 66 y.o. year old female with recent admission to the hospital on 04/29/11 with cardiac arrest after seizure during dialysis.  Patient with recent history of failing cognitive health.  Patient  transferred to CIR on 05/04/2011 .    Patient currently requires min with basic self-care skills secondary to decreased attention, decreased awareness, decreased problem solving, decreased safety awareness and decreased memory and decreased standing balance and decreased balance strategies.  Prior to hospitalization, patient could complete IADL with supervision.  Patient with recent history of falls at home.  Patient will benefit from skilled intervention to increase independence with basic self-care skills prior to discharge home with care partner.  Anticipate patient will require 24 hour supervision and no further OT follow recommended.  OT - End of Session Activity Tolerance: Tolerates 30+ min activity with multiple rests Endurance Deficit: Yes OT Assessment Rehab Potential: Good Barriers to Discharge: None OT Plan OT Frequency: 1-2 X/day, 60-90 minutes OT Treatment/Interventions: Balance/vestibular training;Cognitive remediation/compensation;Community reintegration;Discharge planning;DME/adaptive equipment instruction;Functional mobility training;Patient/family education;Self Care/advanced ADL retraining;Therapeutic Activities;Therapeutic Exercise;UE/LE Strength taining/ROM OT Recommendation Follow Up Recommendations: None Equipment Recommended: None recommended by OT  OT Evaluation Precautions/Restrictions  Precautions Precautions: Fall Restrictions Weight Bearing Restrictions: No General Chart Reviewed: Yes   Pain Pain Assessment Pain Assessment: No/denies pain Home Living/Prior Functioning Home Living Lives With: Alone Available Help at Discharge: Family Home Adaptive Equipment: Walker - rolling;Straight cane Prior Function Level of Independence: Independent with transfers Able to Take Stairs?: Yes Driving: No (bus to dialysis) Comments: TV   Vision/Perception  Vision - History Baseline Vision: Wears glasses only for  reading Vision - Assessment Eye Alignment: Within  Functional Limits Perception Perception: Within Functional Limits Praxis Praxis: Intact  Cognition Arousal/Alertness: Awake/alert Orientation Level: Oriented X4 Attention: Selective Selective Attention: Appears intact Memory: Impaired Memory Impairment: Decreased short term memory Decreased Short Term Memory: Functional complex Sensation Sensation Light Touch: Appears Intact Stereognosis: Not tested Hot/Cold: Appears Intact Proprioception: Appears Intact Motor  Motor Motor: Within Functional Limits    Trunk/Postural Assessment  Cervical Assessment Cervical Assessment: Within Functional Limits Thoracic Assessment Thoracic Assessment: Within Functional Limits Lumbar Assessment Lumbar Assessment: Within Functional Limits Postural Control Postural Control: Within Functional Limits  Balance Balance Balance Assessed: Yes Extremity/Trunk Assessment RUE Assessment RUE Assessment: Within Functional Limits LUE Assessment LUE Assessment: Exceptions to [facility] (Newer graph site)  See FIM for current functional status Refer to Care Plan for Long Term Goals  Recommendations for other services: None  Discharge Criteria: Patient will be discharged from OT if patient refuses treatment 3 consecutive times without medical reason, if treatment goals not met, if there is a change in medical status, if patient makes no progress towards goals or if patient is discharged from hospital.  The above assessment, treatment plan, treatment alternatives and goals were discussed and mutually agreed upon: by patient  Collier Salina 05/05/2011, 3:26 PM

## 2011-05-05 NOTE — Evaluation (Signed)
Speech Language Pathology Assessment and Plan  Patient Details  Name: [name] MRN: [id number] Date of Birth: [birth year]  SLP Diagnosis: cognitive impairment  Rehab Potential: Good ELOS: 10 days   Today's Date: 05/05/2011 Time: 1330-1430 Time Calculation (min): 60 min  Skilled Therapeutic Intervention: Administered cognitive-linguistic evaluation. Please see below for details.   Problem List:  Patient Active Problem List  Diagnoses  . OTHER AND UNSPECIFIED HYPERLIPIDEMIA  . TOBACCO USER  . Essential hypertension, malignant  . CAD  . Chronic diastolic heart failure  . CVA  . PVD  . ESRD  . LEG PAIN  . Nonspecific abnormal electrocardiogram (ECG) (EKG)  . Cardiac arrest  . Seizure  . Pulmonary edema  . Encephalopathy acute  . HTN (hypertension)  . Nicotine abuse  . Depression  . GERD (gastroesophageal reflux disease)  . Epilepsy  . Physical deconditioning    Past Medical History:  Past Medical History  Diagnosis Date  . CAD (coronary artery disease)     stent to RCA  . CVA (cerebral infarction) 2003    no apparent residual  . Renal failure     related to hypertension  . Hypothyroidism   . PVD (peripheral vascular disease)   . Hyperlipidemia   . Positive PPD     completed rifampin  . Diastolic congestive heart failure   . Renal insufficiency   . Hypertension   . COPD (chronic obstructive pulmonary disease) t  . Cancer     clear cell cancer, kidney  . Complication of anesthesia 12/2010    pt is very confused, with AMS with anesthesia   Past Surgical History:  Past Surgical History  Procedure Date  . Abdominal hysterectomy   . Cholecystectomy   . D&cs   . Right ankle repair   . Left nephrectomy   . Vascular surgery 11/2010    graft inserted to left arm    Assessment & Plan Clinical Impression: Patient is a 66 y.o. year old female with recent admission to the hospital on 04/29/11 with cardiac arrest after seizure during dialysis. Patient with  recent history of failing cognitive health. Patient transferred to CIR on 05/04/2011 .   Currently, pt is demonstrating mild cognitive impairments impacting awareness, problem solving, safety awareness, organization and memory. Pt also demonstrating impaired verbal expression impacted by working memory and thought organization. Pt reports baseline memory impairments and difficulty with verbal expression.  Patient will benefit from skilled SLP intervention to increase cognitive function and overall independence prior to discharge home with care partner. Anticipate patient will require 24 hour supervision.  SLP - End of Session Patient left: in bed;with call bell/phone within reach Nurse Communication: Cognitive/Linguistic strategies reviewed Assessment SLP Recommendation/Assessment: Patient will need skilled Speech Lanaguage Pathology Services during CIR admission Rehab Potential: Good Barriers to Discharge: None Therapy Diagnosis: Cognitive Impairments SLP Plan SLP Frequency: 1-2 X/day, 30-60 minutes Estimated Length of Stay: 10 days SLP Treatment/Interventions: Cognitive remediation/compensation;Internal/external aids;Environmental controls;Cueing hierarchy;Functional tasks;Patient/family education;Therapeutic Activities Recommendation Follow up Recommendations:  (TBD) Equipment Recommended: None recommended by SLP  SLP Evaluation Precautions/Restrictions  Precautions Precautions: Fall Restrictions Weight Bearing Restrictions: No Pain Pain Assessment Pain Assessment: No/denies pain Prior Functioning Lives With: Alone Available Help at Discharge: Family Cognition Overall Cognitive Status: Impaired at baseline Arousal/Alertness: Awake/alert Orientation Level: Oriented X4 Attention: Selective Selective Attention: Appears intact Memory: Impaired Memory Impairment: Decreased short term memory;Decreased recall of new information Decreased Short Term Memory: Verbal  complex;Functional complex Awareness: Impaired Awareness Impairment: Emergent impairment Problem Solving: Impaired Problem  Solving Impairment: Verbal complex;Functional basic Executive Function: Landscape architect: Impaired Organizing Impairment: Verbal basic;Functional basic Safety/Judgment: Appears intact Comprehension Auditory Comprehension Overall Auditory Comprehension: Appears within functional limits for tasks assessed Yes/No Questions: Within Functional Limits Commands: Within Functional Limits Conversation: Simple Interfering Components: Processing speed EffectiveTechniques: Extra processing time Visual Recognition/Discrimination Discrimination: Within Function Limits Reading Comprehension Reading Status: Not tested Expression Expression Primary Mode of Expression: Verbal Verbal Expression Overall Verbal Expression: Impaired at baseline Initiation: No impairment Automatic Speech: Name;Social Response Level of Generative/Spontaneous Verbalization: Conversation Repetition: No impairment Naming: No impairment Pragmatics: No impairment Effective Techniques: Semantic cues (extra time) Other Verbal Expression Comments: pt's verbal expression impacted by thought organization Written Expression Dominant Hand: Left Written Expression: Within Functional Limits Oral/Motor Oral Motor/Sensory Function Overall Oral Motor/Sensory Function: Appears within functional limits for tasks assessed Motor Speech Overall Motor Speech: Appears within functional limits for tasks assessed  See FIM for current functional status Refer to Care Plan for Long Term Goals  Recommendations for other services: None  Discharge Criteria: Patient will be discharged from SLP if patient refuses treatment 3 consecutive times without medical reason, if treatment goals not met, if there is a change in medical status, if patient makes no progress towards goals or if patient is discharged from  hospital.  The above assessment, treatment plan, treatment alternatives and goals were discussed and mutually agreed upon: by patient  Tallon Gertz 05/05/2011, 4:43 PM  .

## 2011-05-05 NOTE — Plan of Care (Signed)
Overall Plan of Care [facility]) Patient Details Name: [name] MRN: [id number] DOB: 05-06-1945  Diagnosis:  Seizure/cardiac arrest, anoxic encephalopathy  Primary Diagnosis:    <principal problem not specified> Co-morbidities: CAD, CVA, pvd, htn, copd  Functional Problem List  Patient demonstrates impairments in the following areas: Balance, Cognition and Endurance  Basic ADL's: bathing, dressing and toileting Advanced ADL's: simple meal preparation  Transfers:  bed mobility, bed to chair, car and floor Locomotion:  ambulation and stairs  Additional Impairments:  Communication  comprehension and expression and Social Cognition   problem solving, memory and awareness  Anticipated Outcomes Item Anticipated Outcome  Eating/Swallowing    Basic self-care  Supervision / cues  Tolieting  ModI  Bowel/Bladder  Mod independent  Transfers  I  Locomotion  I except S community  Communication    Cognition  Supervision  Pain  <2  Safety/Judgment    Other     Therapy Plan: PT Frequency: 2-3 X/day, 60-90 minutes OT Frequency: 1-2 X/day, 60-90 minutes SLP Frequency: 1-2 X/day, 30-60 minutes ELOS <= 1 week  Team Interventions: Item RN PT OT SLP SW TR Other  Self Care/Advanced ADL Retraining   X      Neuromuscular Re-Education         Therapeutic Activities  x X x     UE/LE Strength Training/ROM         UE/LE Coordination Activities         Visual/Perceptual Remediation/Compensation         DME/Adaptive Equipment Instruction  x X      Therapeutic Exercise  x X      Balance/Vestibular Training  x X      Patient/Family Education x x X x     Cognitive Remediation/Compensation  x X x     Functional Mobility Training  x       Ambulation/Gait Training  x       Furniture conservator/restorer Reintegration  x       Dysphagia/Aspiration Musician         Bladder Management x        Bowel Management         Disease Management/Prevention x        Pain Management         Medication Management x        Skin Care/Wound Management         Splinting/Orthotics         Discharge Planning x    x    Psychosocial Support x    x                       Team Discharge Planning: Destination:  Home- daughter's home Projected Follow-up:  PT Projected Equipment Needs:  none by PT or OT Patient/family involved in discharge planning:  Yes  MD ELOS: one week Medical Rehab Prognosis:  Excellent Assessment: pt admitted for cir therapy to focus on safety, cognition, balance, self-care, mobility. Goals are set at mod I to supervision. Pt is motivated and should do quite well.

## 2011-05-05 NOTE — Progress Notes (Signed)
Physical Therapy Session Note  Patient Details  Name: [name] MRN: [id number] Date of Birth: 1945/03/18  Today's Date: 05/05/2011 Time: 1500-1530 Time Calculation (min): 30 min  Short Term Goals: Week 1:  PT Short Term Goal 1 (Week 1): STG = LTG  Skilled Therapeutic Interventions/Progress Updates:    therpeutic activity- balance training to promote hip and ankle strategies; heel - toe, SLS, tandem, squats- reqjuired physical assist to prevent fall with every activity Recalled conversations from morning session but did have some difficulty following instruction for balance exercises  Therapy Documentation Precautions:  Precautions Precautions: Fall Precaution Comments: siezure precautions Restrictions Weight Bearing Restrictions: No       Pain: Pain Assessment Pain Assessment: No/denies pain Other Treatments: Treatments Therapeutic Activity: high level balance traiining  See FIM for current functional status  Therapy/Group: Individual Therapy  Michaelene Song 05/05/2011, 3:23 PM

## 2011-05-05 NOTE — Progress Notes (Signed)
Patient ID: [name], female   DOB: 10/23/1945, 66 y.o.   MRN: [id number] Subjective/Complaints: Slept fairly well. No new complaints this am Full ROS performed  Objective: Vital Signs: Blood pressure 137/56, pulse 94, temperature 98.7 F (37.1 C), temperature source Oral, resp. rate 18, weight 64.5 kg (142 lb 3.2 oz), SpO2 100.00%. No results found.  Basename 05/03/11 0922  WBC 3.9*  HGB 9.8*  HCT 29.8*  PLT 133*    Basename 05/03/11 0923  NA 127*  K 4.3  CL 89*  CO2 28  GLUCOSE 76  BUN 37*  CREATININE 7.43*  CALCIUM 9.6   CBG (last 3)   Basename 05/05/11 0705 05/02/11 1620 05/02/11 1250  GLUCAP 56* 113* 134*    Wt Readings from Last 3 Encounters:  05/05/11 64.5 kg (142 lb 3.2 oz)  05/04/11 67.042 kg (147 lb 12.8 oz)  02/27/11 67.5 kg (148 lb 13 oz)    Physical Exam:  General appearance: alert, cooperative and no distress Head: Normocephalic, without obvious abnormality, atraumatic Eyes: conjunctivae/corneas clear. PERRL, EOM's intact. Fundi benign. Ears: normal TM's and external ear canals both ears Nose: Nares normal. Septum midline. Mucosa normal. No drainage or sinus tenderness. Throat: lips, mucosa, and tongue normal; teeth and gums normal Neck: no adenopathy, no carotid bruit, no JVD, supple, symmetrical, trachea midline and thyroid not enlarged, symmetric, no tenderness/mass/nodules Back: symmetric, no curvature. ROM normal. No CVA tenderness. Resp: clear to auscultation bilaterally Cardio: regular rate and rhythm, S1, S2 normal, no murmur, click, rub or gallop GI: soft, non-tender; bowel sounds normal; no masses,  no organomegaly Extremities: extremities normal, atraumatic, no cyanosis or edema Pulses: 2+ and symmetric Skin: Skin color, texture, turgor normal. No rashes or lesions Neurologic: Pt alert and oriented. Follows all commands. Some processing delays and problems with awareness. No focal weakness but fine motor coordination remains an issue.  No gross CN abnl. Incision/Wound:    Assessment/Plan: 1. Functional deficits secondary to seizure, cardiac arrest, anoxic encephalopathy which require 3+ hours per day of interdisciplinary therapy in a comprehensive inpatient rehab setting. Physiatrist is providing close team supervision and 24 hour management of active medical problems listed below. Physiatrist and rehab team continue to assess barriers to discharge/monitor patient progress toward functional and medical goals. FIM:       FIM - Toileting Toileting steps completed by patient: Adjust clothing prior to toileting;Performs perineal hygiene;Adjust clothing after toileting Toileting: 5: Supervision: Safety issues/verbal cues  FIM - Diplomatic Services operational officer Devices: Grab bars Toilet Transfers: 4-To toilet/BSC: Min A (steadying Pt. > 75%);4-From toilet/BSC: Min A (steadying Pt. > 75%)     FIM - Locomotion: Ambulation Ambulation/Gait Assistance: 4: Min guard  Comprehension Comprehension Mode: Auditory Comprehension: 7-Follows complex conversation/direction: With no assist  Expression Expression Mode: Verbal Expression: 7-Expresses complex ideas: With no assist  Social Interaction Social Interaction: 7-Interacts appropriately with others - No medications needed.  Problem Solving Problem Solving: 7-Solves complex problems: Recognizes & self-corrects  Memory Memory: 7-Complete Independence: No helper  1. DVT Prophylaxis/Anticoagulation: Mechanical: Sequential compression devices, below knee Bilateral lower extremities  2. Pain Management: Monitor. Denies pain today 3. Mood: denies any issues with depression or anxiety.  4. Seizures: Continue depakote 750 mg bid. Last level @66 .0  5. ESRD: continue HD on MWFschedule.  6. Anemia of chronic disease:  7. Hyponatremia: Recheck in HD . Has been ranging from 66 yo 134 range.  8. CAD with NICM: continue : baby ASA with lipitor, coreg, and  Norvasc  LOS (Days) 1 A FACE TO FACE EVALUATION WAS PERFORMED  Criss Bartles T 05/05/2011, 7:25 AM

## 2011-05-05 NOTE — Progress Notes (Signed)
Patient information reviewed and entered into [facility] system by Kashayla Ungerer, RN, CRRN, PPS Coordinator.  Information including medical coding and functional independence measure will be reviewed and updated through discharge.     Per nursing patient was given "Data Collection Information Summary for Patients in Inpatient Rehabilitation Facilities with attached "Privacy Act Statement-Health Care Records" upon admission.   

## 2011-05-06 DIAGNOSIS — Z5189 Encounter for other specified aftercare: Secondary | ICD-10-CM

## 2011-05-06 DIAGNOSIS — G931 Anoxic brain damage, not elsewhere classified: Secondary | ICD-10-CM

## 2011-05-06 NOTE — Progress Notes (Signed)
Patient ID: Ragena Fiola, female   DOB: [birth year], 66 y.o.   MRN: [id number] Subjective/Complaints: Slept fairly well. No new complaints this am, no problem in HD yesterday Review of Systems  Constitutional: Positive for malaise/fatigue.  All other systems reviewed and are negative.     Objective: Vital Signs: Blood pressure 180/80, pulse 72, temperature 98.8 F (37.1 C), temperature source Oral, resp. rate 20, weight 64.6 kg (142 lb 6.7 oz), SpO2 96.00%. No results found.  Basename 05/05/11 1809 05/03/11 0922  WBC 3.8* 3.9*  HGB 9.8* 9.8*  HCT 29.8* 29.8*  PLT 163 133*    Basename 05/05/11 1809 05/03/11 0923  NA 131* 127*  K 4.3 4.3  CL 91* 89*  CO2 26 28  GLUCOSE 96 76  BUN 44* 37*  CREATININE 8.00* 7.43*  CALCIUM 10.6* 9.6   CBG (last 3)   Basename 05/05/11 1630 05/05/11 1125 05/05/11 0807  GLUCAP 136* 149* 78    Wt Readings from Last 3 Encounters:  05/05/11 64.6 kg (142 lb 6.7 oz)  05/04/11 67.042 kg (147 lb 12.8 oz)  02/27/11 67.5 kg (148 lb 13 oz)    Physical Exam:  General appearance: alert, cooperative and no distress Head: Normocephalic, without obvious abnormality, atraumatic Eyes: EOMI,PERRLA Ears: Ext nl Nose: Nares normal. Septum midline. Mucosa normal. No drainage or sinus tenderness. Throat: lips, mucosa, and tongue normal; teeth and gums normal Neck: no adenopathy, no carotid bruit, no JVD, supple, symmetrical, trachea midline and thyroid not enlarged, symmetric, no tenderness/mass/nodules Back: symmetric, no curvature. ROM normal. No CVA tenderness. Resp: clear to auscultation bilaterally Cardio: regular rate and rhythm, S1, S2 normal, no murmur, click, rub or gallop GI: soft, non-tender; bowel sounds normal; no masses,  no organomegaly Extremities: extremities normal, atraumatic, no cyanosis or edema Pulses: 2+ and symmetric Skin: Skin color, texture, turgor normal. No rashes or lesions Neurologic: Pt alert and oriented. Follows all  commands. Some processing delays and problems with awareness. No focal weakness but fine motor coordination remains an issue. No gross CN abnl. Incision/Wound:    Assessment/Plan: 1. Functional deficits secondary to seizure, cardiac arrest, anoxic encephalopathy which require 3+ hours per day of interdisciplinary therapy in a comprehensive inpatient rehab setting. Physiatrist is providing close team supervision and 24 hour management of active medical problems listed below. Physiatrist and rehab team continue to assess barriers to discharge/monitor patient progress toward functional and medical goals. FIM: FIM - Bathing Bathing Steps Patient Completed: Chest;Right Arm;Left Arm;Abdomen;Front perineal area;Buttocks;Right upper leg;Left upper leg;Right lower leg (including foot);Left lower leg (including foot) Bathing: 4: Steadying assist  FIM - Upper Body Dressing/Undressing Upper body dressing/undressing steps patient completed: Thread/unthread right sleeve of pullover shirt/dresss;Thread/unthread left sleeve of pullover shirt/dress;Put head through opening of pull over shirt/dress;Pull shirt over trunk Upper body dressing/undressing: 5: Set-up assist to: Obtain clothing/put away FIM - Lower Body Dressing/Undressing Lower body dressing/undressing steps patient completed: Thread/unthread right underwear leg;Thread/unthread left underwear leg;Pull underwear up/down;Thread/unthread right pants leg;Thread/unthread left pants leg;Pull pants up/down;Don/Doff right sock;Don/Doff left sock Lower body dressing/undressing: 4: Min-Patient completed 75 plus % of tasks  FIM - Toileting Toileting steps completed by patient: Adjust clothing prior to toileting;Performs perineal hygiene;Adjust clothing after toileting Toileting: 5: Supervision: Safety issues/verbal cues  FIM - Diplomatic Services operational officer Devices: Grab bars Toilet Transfers: 4-To toilet/BSC: Min A (steadying Pt. > 75%)  FIM  - Bed/Chair Transfer Bed/Chair Transfer: 5: Supine > Sit: Supervision (verbal cues/safety issues);7: Sit > Supine: No assist;5: Bed > Chair or W/C:  Supervision (verbal cues/safety issues);5: Chair or W/C > Bed: Supervision (verbal cues/safety issues)  FIM - Locomotion: Wheelchair Locomotion: Wheelchair: 1: Total Assistance/staff pushes wheelchair (Pt<25%) FIM - Locomotion: Ambulation Ambulation/Gait Assistance: 4: Min assist Locomotion: Ambulation: 4: Travels 150 ft or more with minimal assistance (Pt.>75%)  Comprehension Comprehension Mode: Auditory Comprehension: 4-Understands basic 75 - 89% of the time/requires cueing 10 - 24% of the time  Expression Expression Mode: Verbal Expression: 4-Expresses basic 75 - 89% of the time/requires cueing 10 - 24% of the time. Needs helper to occlude trach/needs to repeat words.  Social Interaction Social Interaction: 5-Interacts appropriately 90% of the time - Needs monitoring or encouragement for participation or interaction.  Problem Solving Problem Solving: 4-Solves basic 75 - 89% of the time/requires cueing 10 - 24% of the time  Memory Memory: 4-Recognizes or recalls 75 - 89% of the time/requires cueing 10 - 24% of the time  1. DVT Prophylaxis/Anticoagulation: Mechanical: Sequential compression devices, below knee Bilateral lower extremities  2. Pain Management: Monitor. Denies pain today 3. Mood: denies any issues with depression or anxiety.  4. Seizures: Continue depakote 750 mg bid. Last level @66 .0  5. ESRD: continue HD on MWFschedule.  6. Anemia of chronic disease:  7. Hyponatremia: Recheck in HD . Has been ranging from 66 yo 134 range.  8. CAD with NICM: continue : baby ASA with lipitor, coreg, and Norvasc  LOS (Days) 2 A FACE TO FACE EVALUATION WAS PERFORMED  Mell Guia E 05/06/2011, 7:09 AM

## 2011-05-06 NOTE — Progress Notes (Signed)
Speech Language Pathology Daily Session Note  Patient Details  Name: [name] MRN: [id number] Date of Birth: 04/28/1945  Today's Date: 05/06/2011 Time: 0905-1005 Time Calculation (min): 60 min  Short Term Goals: Week 1: SLP Short Term Goal 1 (Week 1): Pt will utilize external memory aids to increase recall and carryover of newly learned information with supervision semantic cues.  SLP Short Term Goal 2 (Week 1): Pt will demonstrate functional problem solving for basic and familair tasks with supervision verbal cues. SLP Short Term Goal 3 (Week 1): Pt will demonstrate emergent awareness of deficits during functional and familiar tasks with Min A verbal and semantic cues.   Skilled Therapeutic Interventions: Patient seen in room to address cognitive-linguistic goals. Patient expressed difficulty in understanding events surrounding her admission, but was able to recall events with minimal context and question cues from speech therapist. Patient participated in verbal reasoning and problem solving task related to medication and comprehension of medications she has been prescribed by CIR MD, as well as reason for them. During this task, patient was able to recall medications that she remembers taking in the past, but stated that she wants to go over her medication list with the nurse or MD.   Daily Session Precautions/Restrictions    FIM:  Comprehension Comprehension Mode: Auditory Comprehension: 5-Follows basic conversation/direction: With no assist Expression Expression Mode: Verbal Expression: 5-Expresses basic 90% of the time/requires cueing < 10% of the time. Social Interaction Social Interaction: 5-Interacts appropriately 90% of the time - Needs monitoring or encouragement for participation or interaction. Problem Solving Problem Solving: 4-Solves basic 75 - 89% of the time/requires cueing 10 - 24% of the time Memory Memory: 4-Recognizes or recalls 75 - 89% of the time/requires  cueing 10 - 24% of the time General    Pain Pain Assessment Pain Assessment: No/denies pain Cognition:   Oral/Motor: Oral Motor/Sensory Function Overall Oral Motor/Sensory Function: Appears within functional limits for tasks assessed Motor Speech Overall Motor Speech: Appears within functional limits for tasks assessed Comprehension: Auditory Comprehension Overall Auditory Comprehension: Appears within functional limits for tasks assessed Yes/No Questions: Within Functional Limits Commands: Within Functional Limits Conversation: Simple Interfering Components: Processing speed EffectiveTechniques: Extra processing time Visual Recognition/Discrimination Discrimination: Within Function Limits Reading Comprehension Reading Status: Not tested Expression: Expression Primary Mode of Expression: Verbal Verbal Expression Overall Verbal Expression: Impaired at baseline Initiation: No impairment Automatic Speech: Name;Social Response Level of Generative/Spontaneous Verbalization: Conversation Repetition: No impairment Naming: No impairment Pragmatics: No impairment Effective Techniques: Semantic cues (extra time) Other Verbal Expression Comments: pt's verbal expression impacted by thought organization Written Expression Dominant Hand: Left Written Expression: Within Functional Limits  Therapy/Group: Individual Therapy  Pablo Lawrence 05/06/2011, 5:06 PM Angela Nevin, MA, CCC-SLP Speech-Language Pathologist [facility] Inpatient Rehabilitation

## 2011-05-06 NOTE — Progress Notes (Signed)
Physical Therapy Session Note  Patient Details  Name: [name] MRN: [id number] Date of Birth: [birth year]  Today's Date: 05/06/2011 Time: 3086-5784 Time Calculation (min): 27 min  Short Term Goals: Week 1:  PT Short Term Goal 1 (Week 1): STG = LTG  Skilled Therapeutic Interventions/Progress Updates:    Participated in group therapy session, performing gait x 150' HHA/min@ and [place] x 10 min, on 70 cm2 for LE strengthening.  Therapy Documentation Precautions:  Precautions Precautions: Fall Precaution Comments: siezure precautions Restrictions Weight Bearing Restrictions: No General: Amount of Missed PT Time (min): 33 Minutes Missed Time Reason: Other (comment) (returned to room to use bathroom and stayed to eat lunch.)  See FIM for current functional status  Therapy/Group: Group Therapy  [name] 05/06/2011, 2:16 PM

## 2011-05-06 NOTE — Progress Notes (Signed)
Occupational Therapy Session Note  Patient Details  Name: [name] MRN: [id number] Date of Birth: 1945-05-07  Today's Date: 05/06/2011  Session #1  Time: 0815-0900 Time Calculation (min): 45 min  Skilled Therapeutic Interventions: ADL-retraining with emphasis on dynamic sitting and standing balance, functional mobility, safety awareness, sequencing, memory, and divided attention.  Patient demo'd mild LOB X2 while rising from sit to stand and required redirection to complete tasks (to go to shower after toileting).  Patient responds well to redirection but seeks clarification often and demo's short term memory deficits with placing items in her room and with advancing through ADL sequence.    Therapy Documentation Precautions:  Precautions Precautions: Fall Precaution Comments: siezure precautions Restrictions Weight Bearing Restrictions: No   Pain: 0/10; no report of pain.  See FIM for current functional status  Therapy/Group: Individual Therapy  Session #2   Time: 1430-1500 Time Calculation (min): 30 min  Skilled Therapeutic Interventions: Therapeutic activity with emphasis on endurance, functional mobility, falls prevention and dynamic standing balance.  Patient was again confused by sequence of activities in her day, despite presence of daily schedule in her room.  She was disoriented to day and time and required reference of calendar and clock for reorientation.   Patient pushed her w/c, walking behind it, for approx 10 minutes, throughout 4000 wing without complaint of fatigue or dizziness but demo'd decreased control of chair as activity continued due to additional distractions.  Therapy: Individual Therapy  Georgeanne Nim 05/06/2011, 4:15 PM

## 2011-05-07 NOTE — Progress Notes (Signed)
Patient ID: [name], female   DOB: [birth year], 66 y.o.   MRN: [id number]   [facility] Progress Note    Subjective:   Reports to be feeling fair- denies any CP/SOB- on acyclovir   Objective:   BP 204/66  Pulse 82  Temp(Src) 98.3 F (36.8 C) (Oral)  Resp 16  Wt 64.1 kg (141 lb 5 oz)  SpO2 96%  Physical Exam: WJX:[id number] resting in bed- states she is fatigued this morning HYQ:MVHQI RRR, normal S1 and S2  Resp:CTA bilaterally, no rales/rhonchi ONG:EXBM, flat, NT, BS normal Ext:No LE edema  Labs: BMET  Lab 05/05/11 1809 05/03/11 0923 05/02/11 0650 05/02/11 0423 05/01/11 0500  NA 131* 127* 133* 120* 134*  K 4.3 4.3 3.8 3.6 4.2  CL 91* 89* 95* 84* 96  CO2 26 28 28 25 23   GLUCOSE 96 76 96 875* 61*  BUN 44* 37* 19 16 33*  CREATININE 8.00* 7.43* 5.24* 4.59* 7.92*  ALB -- -- -- -- --  CALCIUM 10.6* 9.6 10.1 9.0 9.6  PHOS 5.8* 5.9* -- -- 5.7*   CBC  Lab 05/05/11 1809 05/03/11 0922 05/01/11 0500  WBC 3.8* 3.9* 4.0  NEUTROABS -- -- --  HGB 9.8* 9.8* 10.1*  HCT 29.8* 29.8* 31.0*  MCV 77.8* 78.6 77.7*  PLT 163 133* 111*    @IMGRELPRIORS @ Medications:      . acyclovir  400 mg Oral 5 X Daily  . amLODipine  10 mg Oral Daily  . aspirin EC  81 mg Oral Daily  . atorvastatin  10 mg Oral q1800  . carvedilol  25 mg Oral BID WC  . citalopram  20 mg Oral Daily  . diphenhydrAMINE  25 mg Oral Q M,W,F-HD  . divalproex  750 mg Oral Q12H  . feeding supplement  1 Container Oral TID BM  . Fluticasone-Salmeterol  1 puff Inhalation Q12H  . hydrALAZINE  25 mg Oral Q8H  . levothyroxine  88 mcg Oral Daily  . multivitamin  1 tablet Oral QHS  . pantoprazole  40 mg Oral Q1200  . paricalcitol  7 mcg Intravenous Q M,W,F-HD  . sevelamer  2,400 mg Oral TID WC     Assessment/ Plan:   1.Seizure with CPR in post-ictal phase - Currently seizure-free, restarted back on depakote by neurology- Currently in CIR for retraining prior to going home  2.ESRD - HD on MWF @  [facility]; Plan for HD monday- no acute needs identified to prompt need for HD today.  3. Hypertension/volume - monitor blood pressures- currently fairly controlled. Remains on hydralazine, amlodipine and carvedilol. 4. Anemia - hemoglobin currently at goal, remains off Epogen/off iron.  5.Metabolic bone disease - hyperphosphatemic- increased renagel dose yesterday. Continue zemplar/sensipar  6.Malnutrition - last Alb 3.4, recently on Megace.  7.Depression - on Celexa. 8. Zoster: suspected early zoster over thigh- on acyclovir    Zetta Bills, MD 05/07/2011, 9:53 AM

## 2011-05-07 NOTE — Progress Notes (Signed)
Physical Therapy Note  Patient Details  Name: [name] MRN: [id number] Date of Birth: October 02, 1945 Today's Date: 05/07/2011  Time: 1100-1200 60 minutes  Pt participated in gait training group with min A for gait with HHA.  Attempted gait with SPC, pt unsafe, required mod cuing for sequencing and safety with cane.  Pt is more safe with HHA at this time.  Side stepping and mini squats for hip control for stance portion of gait with manual cues at hips.  Pt with good tolerance to group therapy session.  Group therapy   Razia Screws 05/07/2011, 4:35 PM

## 2011-05-07 NOTE — Progress Notes (Signed)
Patient ID: [name], female   DOB: 1945-06-26, 66 y.o.   MRN: [id number] Subjective/Complaints: I want boiled eggs and Splenda No new complaints this am, Review of Systems  Constitutional: Positive for malaise/fatigue.  All other systems reviewed and are negative.     Objective: Vital Signs: Blood pressure 204/66, pulse 82, temperature 98.3 F (36.8 C), temperature source Oral, resp. rate 16, weight 64.1 kg (141 lb 5 oz), SpO2 96.00%. No results found.  Basename 05/05/11 1809  WBC 3.8*  HGB 9.8*  HCT 29.8*  PLT 163    Basename 05/05/11 1809  NA 131*  K 4.3  CL 91*  CO2 26  GLUCOSE 96  BUN 44*  CREATININE 8.00*  CALCIUM 10.6*   CBG (last 3)   Basename 05/05/11 1630 05/05/11 1125 05/05/11 0807  GLUCAP 136* 149* 78    Wt Readings from Last 3 Encounters:  05/07/11 64.1 kg (141 lb 5 oz)  05/04/11 67.042 kg (147 lb 12.8 oz)  02/27/11 67.5 kg (148 lb 13 oz)    Physical Exam:  General appearance: alert, cooperative and no distress Head: Normocephalic, without obvious abnormality, atraumatic Eyes: EOMI,PERRLA Ears: Ext nl Nose: Nares normal. Septum midline. Mucosa normal. No drainage or sinus tenderness. Throat: lips, mucosa, and tongue normal; teeth and gums normal Neck: no adenopathy, no carotid bruit, no JVD, supple, symmetrical, trachea midline and thyroid not enlarged, symmetric, no tenderness/mass/nodules Back: symmetric, no curvature. ROM normal. No CVA tenderness. Resp: clear to auscultation bilaterally Cardio: regular rate and rhythm, S1, S2 normal, no murmur, click, rub or gallop GI: soft, non-tender; bowel sounds normal; no masses,  no organomegaly Extremities: extremities normal, atraumatic, no cyanosis or edema Pulses: 2+ and symmetric Skin: Skin color, texture, turgor normal. No rashes or lesions Neurologic: Pt alert and oriented. Follows all commands. Some processing delays and problems with awareness. No focal weakness but fine motor coordination  remains an issue. No gross CN abnl. Incision/Wound:    Assessment/Plan: 1. Functional deficits secondary to seizure, cardiac arrest, anoxic encephalopathy which require 3+ hours per day of interdisciplinary therapy in a comprehensive inpatient rehab setting. Physiatrist is providing close team supervision and 24 hour management of active medical problems listed below. Physiatrist and rehab team continue to assess barriers to discharge/monitor patient progress toward functional and medical goals. FIM: FIM - Bathing Bathing Steps Patient Completed: Chest;Right Arm;Left Arm;Abdomen;Front perineal area;Right upper leg;Left upper leg;Right lower leg (including foot);Left lower leg (including foot) Bathing: 5: Supervision: Safety issues/verbal cues  FIM - Upper Body Dressing/Undressing Upper body dressing/undressing steps patient completed: Thread/unthread right bra strap;Thread/unthread left bra strap;Hook/unhook bra (no shirt due to wait for RN (dressing change to left UE)) Upper body dressing/undressing: 5: Set-up assist to: Obtain clothing/put away FIM - Lower Body Dressing/Undressing Lower body dressing/undressing steps patient completed: Thread/unthread right underwear leg;Thread/unthread left underwear leg;Pull underwear up/down;Thread/unthread right pants leg;Thread/unthread left pants leg;Pull pants up/down;Don/Doff right sock;Don/Doff left sock Lower body dressing/undressing: 5: Supervision: Safety issues/verbal cues  FIM - Toileting Toileting steps completed by patient: Adjust clothing prior to toileting;Performs perineal hygiene;Adjust clothing after toileting Toileting Assistive Devices: Grab bar or rail for support Toileting: 5: Supervision: Safety issues/verbal cues  FIM - Diplomatic Services operational officer Devices: Grab bars Toilet Transfers: 5-To toilet/BSC: Supervision (verbal cues/safety issues);5-From toilet/BSC: Supervision (verbal cues/safety issues)  FIM -  Bed/Chair Transfer Bed/Chair Transfer Assistive Devices: Bed rails;HOB elevated Bed/Chair Transfer: 6: Supine > Sit: No assist;5: Bed > Chair or W/C: Supervision (verbal cues/safety issues)  FIM - Locomotion:  Wheelchair Locomotion: Wheelchair: 1: Total Assistance/staff pushes wheelchair (Pt<25%) FIM - Locomotion: Ambulation Locomotion: Ambulation Assistive Devices: Other (comment) (HHA) Ambulation/Gait Assistance: 4: Min assist Locomotion: Ambulation: 4: Travels 150 ft or more with minimal assistance (Pt.>75%)  Comprehension Comprehension Mode: Auditory Comprehension: 5-Follows basic conversation/direction: With no assist  Expression Expression Mode: Verbal Expression: 5-Expresses basic 90% of the time/requires cueing < 10% of the time.  Social Interaction Social Interaction: 5-Interacts appropriately 90% of the time - Needs monitoring or encouragement for participation or interaction.  Problem Solving Problem Solving: 4-Solves basic 75 - 89% of the time/requires cueing 10 - 24% of the time  Memory Memory: 4-Recognizes or recalls 75 - 89% of the time/requires cueing 10 - 24% of the time  1. DVT Prophylaxis/Anticoagulation: Mechanical: Sequential compression devices, below knee Bilateral lower extremities  2. Pain Management: Monitor. Denies pain today 3. Mood: denies any issues with depression or anxiety.  4. Seizures: Continue depakote 750 mg bid. Last level @66 .0  5. ESRD: continue HD on MWFschedule.  6. Anemia of chronic disease:  7. Hyponatremia: Recheck in HD . Has been ranging from 66 yo 134 range.  8. CAD with NICM: continue : baby ASA with lipitor, coreg, and Norvasc 9.  Systolic HTN check ortho BPs LOS (Days) 3 A FACE TO FACE EVALUATION WAS PERFORMED  Jyden Kromer E 05/07/2011, 8:21 AM

## 2011-05-08 ENCOUNTER — Inpatient Hospital Stay (HOSPITAL_COMMUNITY): Payer: Medicare Other

## 2011-05-08 DIAGNOSIS — G931 Anoxic brain damage, not elsewhere classified: Secondary | ICD-10-CM

## 2011-05-08 DIAGNOSIS — Z5189 Encounter for other specified aftercare: Secondary | ICD-10-CM

## 2011-05-08 LAB — GLUCOSE, CAPILLARY
Glucose-Capillary: 103 mg/dL — ABNORMAL HIGH (ref 70–99)
Glucose-Capillary: 148 mg/dL — ABNORMAL HIGH (ref 70–99)

## 2011-05-08 LAB — RENAL FUNCTION PANEL
Albumin: 2.8 g/dL — ABNORMAL LOW (ref 3.5–5.2)
BUN: 47 mg/dL — ABNORMAL HIGH (ref 6–23)
CO2: 23 mEq/L (ref 19–32)
Calcium: 10.6 mg/dL — ABNORMAL HIGH (ref 8.4–10.5)
Chloride: 95 mEq/L — ABNORMAL LOW (ref 96–112)
Creatinine, Ser: 8.48 mg/dL — ABNORMAL HIGH (ref 0.50–1.10)
GFR calc Af Amer: 5 mL/min — ABNORMAL LOW (ref >90)
GFR calc non Af Amer: 4 mL/min — ABNORMAL LOW (ref >90)
Glucose, Bld: 71 mg/dL (ref 70–99)
Phosphorus: 5.1 mg/dL — ABNORMAL HIGH (ref 2.3–4.6)
Potassium: 4.2 mEq/L (ref 3.5–5.1)
Sodium: 133 mEq/L — ABNORMAL LOW (ref 135–145)

## 2011-05-08 LAB — CBC
HCT: 28.4 % — ABNORMAL LOW (ref 36.0–46.0)
Hemoglobin: 9.4 g/dL — ABNORMAL LOW (ref 12.0–15.0)
MCH: 25.3 pg — ABNORMAL LOW (ref 26.0–34.0)
MCHC: 33.1 g/dL (ref 30.0–36.0)
MCV: 76.3 fL — ABNORMAL LOW (ref 78.0–100.0)
Platelets: 157 10*3/uL (ref 150–400)
RBC: 3.72 MIL/uL — ABNORMAL LOW (ref 3.87–5.11)
RDW: 16.4 % — ABNORMAL HIGH (ref 11.5–15.5)
WBC: 3.7 10*3/uL — ABNORMAL LOW (ref 4.0–10.5)

## 2011-05-08 MED ORDER — AMLODIPINE BESYLATE 10 MG PO TABS
10.0000 mg | ORAL_TABLET | Freq: Every day | ORAL | Status: DC
  Administered 2011-05-09 – 2011-05-16 (×8): 10 mg via ORAL
  Filled 2011-05-08 (×9): qty 1

## 2011-05-08 MED ORDER — HYDRALAZINE HCL 25 MG PO TABS
25.0000 mg | ORAL_TABLET | Freq: Four times a day (QID) | ORAL | Status: DC
  Administered 2011-05-08 – 2011-05-14 (×18): 25 mg via ORAL
  Filled 2011-05-08 (×28): qty 1

## 2011-05-08 MED ORDER — BOOST / RESOURCE BREEZE PO LIQD: 1.0000 | Freq: Every day | ORAL | Status: DC | PRN

## 2011-05-08 MED ORDER — CITALOPRAM HYDROBROMIDE 20 MG PO TABS
20.0000 mg | ORAL_TABLET | Freq: Every day | ORAL | Status: DC
  Administered 2011-05-09 – 2011-05-16 (×7): 20 mg via ORAL
  Filled 2011-05-08 (×8): qty 1

## 2011-05-08 NOTE — Progress Notes (Signed)
Occupational Therapy Session Note  Patient Details  Name: [name] MRN: [id number] Date of Birth: 1945/09/10  Today's Date: 05/08/2011 Time: 0915-1000 Time Calculation (min): 45 min  Skilled Therapeutic Interventions/Progress Updates: Patient very confused this am.  Informed RN. RN reported Blood sugar slightly low this am, and BP elevated. Patient speech unintelligible at times, very lethargic with significantly limited activity tolerance.  Patient disoriented to place, time, situation.  Assisted patient to bathe and dress.  Patient requiring constant (max) cueing to remain engaged, and to move to next step.  Mod to max assist needed today for basic self care tasks.     Therapy Documentation Precautions:  Precautions Precautions: Fall Precaution Comments: siezure precautions Restrictions Weight Bearing Restrictions: No General:   Vital Signs: Therapy Vitals BP: 187/77 mmHg Oxygen Therapy SpO2: 97 % O2 Device: None (Room air) Pain:  No pain    See FIM for current functional status  Therapy/Group: Individual Therapy  Collier Salina 05/08/2011, 10:04 AM

## 2011-05-08 NOTE — Progress Notes (Signed)
Physical Therapy Session Note  Patient Details  Name: [name] MRN: [id number] Date of Birth: 10/08/1945  Today's Date: 05/08/2011 Time: 0800-0854 Time Calculation (min): 54 min  Short Term Goals: Week 1:  PT Short Term Goal 1 (Week 1): STG = LTG  Skilled Therapeutic Interventions/Progress Updates:    Pt having trouble word finding this am,making statements that were nosensacle, trouble following 1 step commands, balance worse than Fri, nurse informed, blood sugar in 70's and pt encouraged to drink juice at beginning of session.Gait in room (MRADL's) and pt requiring up to mod A for balance with shuffling gait pattern. Gait training with RW instruction 125 ft to increase safety, min A and pt unable to follow simple commands to keep feet within walker. BP 183/77 99% O2 HR 72 (pt aware "my mind is screwing me up"- that she was having increased difficulty with all tasks.) Of note pt BP was more elevated this am. Pt nurse came and was giving meds, pt requested to go back to room to toielt again (had 20 minutes prior) and became increasingly agitated once back to room and refused to get on the toliet. Pt assisted back to bed to rest, NT informed- alarm set.  Therapy Documentation Precautions:  Precautions Precautions: Fall Precaution Comments: siezure precautions Restrictions Weight Bearing Restrictions: No Pain:denies   Mobility:min A for transfers     See FIM for current functional status  Therapy/Group: Individual Therapy  Michaelene Song 05/08/2011, 8:54 AM

## 2011-05-08 NOTE — Progress Notes (Signed)
Clinical/Bedside Swallow Evaluation Patient Details  Name: [name] MRN: [id number] DOB: Jun 25, 1945  Today's Date: 05/08/2011 Time: 1330-1430 Time Calculation (min): 60 min  Skilled Therapeutic Intervention: Pt administered BSE today due to change in medical status. Please see below for details. Recommend Dys. 2 diet with thin liquids via cup with full supervision to increase utilization of swallowing compensatory strategies and overall safety. RN, PA and pt's daughter aware current diet recommendations and swallowing strategies.   Past Medical History:  Past Medical History  Diagnosis Date  . CAD (coronary artery disease)     stent to RCA  . CVA (cerebral infarction) 2003    no apparent residual  . Renal failure     related to hypertension  . Hypothyroidism   . PVD (peripheral vascular disease)   . Hyperlipidemia   . Positive PPD     completed rifampin  . Diastolic congestive heart failure   . Renal insufficiency   . Hypertension   . COPD (chronic obstructive pulmonary disease) t  . Cancer     clear cell cancer, kidney  . Complication of anesthesia 12/2010    pt is very confused, with AMS with anesthesia   Past Surgical History:  Past Surgical History  Procedure Date  . Abdominal hysterectomy   . Cholecystectomy   . D&cs   . Right ankle repair   . Left nephrectomy   . Vascular surgery 11/2010    graft inserted to left arm   HPI: Pt demonstrating a change in medical status today. Pt is confused and is overall Max A for sustained attention. Pt is also demonstrating language of confusion and decreased word finding. BSE today to assess current swallowing function with recommended diet since recent change in cognitive function.  Assessment/Recommendations/Treatment Plan  SLP Assessment Clinical Impression Statement: Pt administered BSE today due to change in medical status. Pt with a decline in cognitive function and demonstrating confusion and decreased sustained  attention impacting overall awareness and mastication of bolus. Pt had to eventually expell bolus from oral cavity due to distractability. Recommend  pt's diet change to Dys. 2 textures with thin liquids until overall cognitive function improves.  Risk for Aspiration: Moderate  Swallow Evaluation Recommendations Diet Recommendations: Dysphagia 2 (Fine chop);Thin liquid Liquid Administration via: Cup;No straw Medication Administration: Crushed with puree Supervision: Patient able to self feed;Full supervision/cueing for compensatory strategies Compensations: Slow rate;Small sips/bites Postural Changes and/or Swallow Maneuvers: Seated upright 90 degrees Oral Care Recommendations: Oral care BID  Treatment Plan Speech Therapy Frequency: min 5x/week Treatment Duration: 2 weeks Interventions: Aspiration precaution training;Compensatory techniques;Diet toleration management by SLP;Trials of upgraded texture/liquids;Patient/family education;Group dysphagia treatment  Prognosis Prognosis for Safe Diet Advancement: Good  Individuals Consulted Consulted and Agree with Results and Recommendations: Family member/caregiver;Patient Family Member Consulted: daughter   Swallow Study  Consistency Results  Ice Chips Ice chips: Not tested  Thin Liquid Thin Liquid: Impaired Presentation: Cup Pharyngeal  Phase Impairments: Cough - Immediate Other Comments: Pt with immeditate cough X 1 due to pt extending her head back for a large sip   Nectar Thick Liquid Nectar Thick Liquid: Not tested  Honey Thick Liquid Honey Thick Liquid: Not tested  Puree Puree: Within functional limits Presentation: Self Fed  Solid Solid: Impaired Presentation: Self Fed Oral Phase Impairments: Poor awareness of bolus Other Comments: Pt demonstrating poor attention and poor awareness of bolus with decreased mastication, pt had to eventually expell bolus from oral cavity.    Salome Cozby 05/08/2011,3:53  PM

## 2011-05-08 NOTE — Progress Notes (Signed)
Occupational Therapy Session Note  Patient Details  Name: [name] MRN: [id number] Date of Birth: 1945/06/17  Today's Date: 05/08/2011 Time: 4098-1191 Time Calculation (min): 24 min   Skilled Therapeutic Interventions/Progress Updates:    Focus of session on following multiple steps, sustained attention, initiation and sequencing during LB dressing. Pt had just returned from CAT Scan. Pt apparently confused. Pt was not oriented to place, time or situation. Pt demonstrating word finding deficits. Unable to appropriately ID family friend in room. Apraxic. Poor static standing balance, requiring mod A for stand pivot transfer.nsg aware. Pt in no apparent distress.  Therapy Documentation Precautions:  Precautions Precautions: Fall Precaution Comments: siezure precautions Restrictions Weight Bearing Restrictions: No Pain: Pain Assessment Pain Assessment: No/denies pain ADL:   See FIM for current functional status  Therapy/Group: Individual Therapy  Shanetha Bradham,HILLARY 05/08/2011, 4:02 PM  [facility], OTR/L  [phone] 05/08/2011

## 2011-05-08 NOTE — Progress Notes (Signed)
Recreational Therapy Session Note  Patient Details  Name: [name] MRN: [id number] Date of Birth: [birth year] Today's Date: 05/08/2011 Pt on HOLD for TR services at this time.  Will continue to monitor through team for future participation. Rudell Marlowe 05/08/2011, 3:45 PM

## 2011-05-08 NOTE — Progress Notes (Signed)
INITIAL ADULT NUTRITION ASSESSMENT Date: 05/08/2011   Time: 11:38 AM  Reason for Assessment: MD Consult  ASSESSMENT: Female 66 y.o.  Dx: Seizure  Hx:  Past Medical History  Diagnosis Date  . CAD (coronary artery disease)     stent to RCA  . CVA (cerebral infarction) 2003    no apparent residual  . Renal failure     related to hypertension  . Hypothyroidism   . PVD (peripheral vascular disease)   . Hyperlipidemia   . Positive PPD     completed rifampin  . Diastolic congestive heart failure   . Renal insufficiency   . Hypertension   . COPD (chronic obstructive pulmonary disease) t  . Cancer     clear cell cancer, kidney  . Complication of anesthesia 12/2010    pt is very confused, with AMS with anesthesia   Past Surgical History  Procedure Date  . Abdominal hysterectomy   . Cholecystectomy   . D&cs   . Right ankle repair   . Left nephrectomy   . Vascular surgery 11/2010    graft inserted to left arm   Related Meds:     . acyclovir  400 mg Oral 5 X Daily  . amLODipine  10 mg Oral Daily  . aspirin EC  81 mg Oral Daily  . atorvastatin  10 mg Oral q1800  . carvedilol  25 mg Oral BID WC  . citalopram  20 mg Oral QHS  . diphenhydrAMINE  25 mg Oral Q M,W,F-HD  . divalproex  750 mg Oral Q12H  . feeding supplement  1 Container Oral TID BM  . Fluticasone-Salmeterol  1 puff Inhalation Q12H  . hydrALAZINE  25 mg Oral Q6H  . levothyroxine  88 mcg Oral Daily  . multivitamin  1 tablet Oral QHS  . pantoprazole  40 mg Oral Q1200  . paricalcitol  7 mcg Intravenous Q M,W,F-HD  . sevelamer  2,400 mg Oral TID WC  . DISCONTD: citalopram  20 mg Oral Daily  . DISCONTD: hydrALAZINE  25 mg Oral Q8H   Ht: 5\' 5"  (165.1 cm)  Wt: 141 lb 5 oz (64.1 kg) s/p HD on 05/07/11  Ideal Wt:    56.8 kg % Ideal Wt: 113%  Wt Readings from Last 15 Encounters:  05/07/11 141 lb 5 oz (64.1 kg)  05/04/11 147 lb 12.8 oz (67.042 kg)  02/27/11 148 lb 13 oz (67.5 kg)  02/27/11 148 lb 13 oz (67.5  kg)  12/23/10 165 lb 9.1 oz (75.1 kg)  11/29/10 173 lb (78.472 kg)  11/16/09 168 lb (76.204 kg)  05/11/09 194 lb (87.998 kg)  10/28/08 188 lb (85.276 kg)  07/22/08 193 lb (87.544 kg)  Usual Wt: 168 lb (4 months ago) % Usual Wt: 84%  BMI is 23.7 - wt is WNL.  Food/Nutrition Related Hx: 16% wt loss x 4 months  Labs:  CMP     Component Value Date/Time   NA 131* 05/05/2011 1809   K 4.3 05/05/2011 1809   CL 91* 05/05/2011 1809   CO2 26 05/05/2011 1809   GLUCOSE 96 05/05/2011 1809   BUN 44* 05/05/2011 1809   CREATININE 8.00* 05/05/2011 1809   CALCIUM 10.6* 05/05/2011 1809   PROT 6.7 04/29/2011 1112   ALBUMIN 2.8* 05/05/2011 1809   AST 37 04/29/2011 1112   ALT 26 04/29/2011 1112   ALKPHOS 99 04/29/2011 1112   BILITOT 0.8 04/29/2011 1112   GFRNONAA 5* 05/05/2011 1809   GFRAA 5* 05/05/2011 1809  Phosphorus 5.8H   Intake/Output Summary (Last 24 hours) at 05/08/11 1141 Last data filed at 05/08/11 0900  Gross per 24 hour  Intake    360 ml  Output      0 ml  Net    360 ml   Diet Order: Renal 80-90  Supplements/Tube Feeding: Breeze PO TID  IVF:    Estimated Nutritional Needs:   Kcal:  1700 - 1900 kcal Protein:  80-95 grams Fluid: 1200 ml  Pt seen by RD during acute hospitalization.  Previously receiving megace for appetite stimulant during acute hospitalization.  Intake remains highly variable, ranging from 0 - 75% of meals.  Patient with non-severe (moderate) malnutrition in the context of chronic illness, given 18% weight loss in the past 4 months, visible mild loss of body fat and muscle mass (sunken face/prominent cheek bones and small ankles with prominent bones); suspect intake PTA was < 75% for > one month with recent requirement for Megace  RD consulted during rehab admission 2/2 need for "good HS snack." Per RN, pt received milk, graham crackers, and fruit cup last night as still had trending lower blood sugars in 70's.  This RD attempted to speak with pt, however she was not lucid,  asking this RD for a cigarette and not able to tell this RD where she was. Discussed this with RN, who is aware of condition.  NUTRITION DIAGNOSIS: -Inadequate oral intake (NI-2.1).  Status: Ongoing  RELATED TO: variable appetite and waxing/waning mental status  AS EVIDENCE BY: variable PO intake  MONITORING/EVALUATION(Goals): Goal: Pt to consume >/= 75% of meals and supplements. Monitor: PO intake, weights, labs  EDUCATION NEEDS: -Education not appropriate at this time  INTERVENTION: 1. RD to add HS snack, will review with pt when mental status improves 2. Continue Breeze PO TID 3. RD to continue to follow  Dietitian #: [phone]  DOCUMENTATION CODES Per approved criteria  -Non-severe (moderate) malnutrition in the context of chronic illness    Adair Laundry 05/08/2011, 11:38 AM

## 2011-05-08 NOTE — Progress Notes (Signed)
Patient ID: [name], female   DOB: 05/20/1945, 66 y.o.   MRN: [id number] Patient ID: [name], female   DOB: [birth year], 66 y.o.   MRN: [id number] Subjective/Complaints: No new issues. Just waking up, Review of Systems  Constitutional: Positive for malaise/fatigue.  All other systems reviewed and are negative.     Objective: Vital Signs: Blood pressure 206/68, pulse 62, temperature 97.5 F (36.4 C), temperature source Oral, resp. rate 19, weight 64.1 kg (141 lb 5 oz), SpO2 96.00%. No results found.  Basename 05/05/11 1809  WBC 3.8*  HGB 9.8*  HCT 29.8*  PLT 163    Basename 05/05/11 1809  NA 131*  K 4.3  CL 91*  CO2 26  GLUCOSE 96  BUN 44*  CREATININE 8.00*  CALCIUM 10.6*   CBG (last 3)   Basename 05/05/11 1630 05/05/11 1125 05/05/11 0807  GLUCAP 136* 149* 78    Wt Readings from Last 3 Encounters:  05/07/11 64.1 kg (141 lb 5 oz)  05/04/11 67.042 kg (147 lb 12.8 oz)  02/27/11 67.5 kg (148 lb 13 oz)    Physical Exam:  General appearance: alert, cooperative and no distress Head: Normocephalic, without obvious abnormality, atraumatic Eyes: EOMI,PERRLA Ears: Ext nl Nose: Nares normal. Septum midline. Mucosa normal. No drainage or sinus tenderness. Throat: lips, mucosa, and tongue normal; teeth and gums normal Neck: no adenopathy, no carotid bruit, no JVD, supple, symmetrical, trachea midline and thyroid not enlarged, symmetric, no tenderness/mass/nodules Back: symmetric, no curvature. ROM normal. No CVA tenderness. Resp: clear to auscultation bilaterally Cardio: regular rate and rhythm, S1, S2 normal, no murmur, click, rub or gallop GI: soft, non-tender; bowel sounds normal; no masses,  no organomegaly Extremities: extremities normal, atraumatic, no cyanosis or edema Pulses: 2+ and symmetric Skin: Skin color, texture, turgor normal. No rashes or lesions Neurologic: slow to arouse this am, limited insight. No focal weakness but fine motor coordination  remains an issue. No gross CN abnl. Incision/Wound:    Assessment/Plan: 1. Functional deficits secondary to seizure, cardiac arrest, anoxic encephalopathy which require 3+ hours per day of interdisciplinary therapy in a comprehensive inpatient rehab setting. Physiatrist is providing close team supervision and 24 hour management of active medical problems listed below. Physiatrist and rehab team continue to assess barriers to discharge/monitor patient progress toward functional and medical goals. FIM: FIM - Bathing Bathing Steps Patient Completed: Chest;Right Arm;Left Arm;Abdomen;Front perineal area;Buttocks;Right upper leg;Left upper leg;Right lower leg (including foot);Left lower leg (including foot) Bathing: 5: Supervision: Safety issues/verbal cues  FIM - Upper Body Dressing/Undressing Upper body dressing/undressing steps patient completed: Thread/unthread left sleeve of pullover shirt/dress;Hook/unhook bra;Thread/unthread right sleeve of pullover shirt/dresss;Put head through opening of pull over shirt/dress;Thread/unthread right bra strap;Thread/unthread left bra strap Upper body dressing/undressing: 5: Supervision: Safety issues/verbal cues FIM - Lower Body Dressing/Undressing Lower body dressing/undressing steps patient completed: Pull pants up/down;Fasten/unfasten pants;Thread/unthread right pants leg;Thread/unthread left pants leg;Pull underwear up/down Lower body dressing/undressing: 5: Supervision: Safety issues/verbal cues  FIM - Toileting Toileting steps completed by patient: Adjust clothing prior to toileting;Performs perineal hygiene;Adjust clothing after toileting Toileting Assistive Devices: Grab bar or rail for support Toileting: 5: Supervision: Safety issues/verbal cues  FIM - Diplomatic Services operational officer Devices: Grab bars Toilet Transfers: 5-To toilet/BSC: Supervision (verbal cues/safety issues)  FIM - Banker  Devices: Bed rails;HOB elevated Bed/Chair Transfer: 6: Supine > Sit: No assist;5: Sit > Supine: Supervision (verbal cues/safety issues)  FIM - Locomotion: Wheelchair Locomotion: Wheelchair: 1: Total Assistance/staff pushes wheelchair (Pt<25%) FIM -  Locomotion: Ambulation Locomotion: Ambulation Assistive Devices: Other (comment) (HHA) Ambulation/Gait Assistance: 4: Min assist Locomotion: Ambulation: 4: Travels 150 ft or more with minimal assistance (Pt.>75%)  Comprehension Comprehension Mode: Auditory Comprehension: 5-Follows basic conversation/direction: With no assist  Expression Expression Mode: Verbal Expression: 5-Expresses basic 90% of the time/requires cueing < 10% of the time.  Social Interaction Social Interaction: 5-Interacts appropriately 90% of the time - Needs monitoring or encouragement for participation or interaction.  Problem Solving Problem Solving: 4-Solves basic 75 - 89% of the time/requires cueing 10 - 24% of the time  Memory Memory: 4-Recognizes or recalls 75 - 89% of the time/requires cueing 10 - 24% of the time  1. DVT Prophylaxis/Anticoagulation: Mechanical: Sequential compression devices, below knee Bilateral lower extremities  2. Pain Management: Monitor. Denies pain today 3. Mood: denies any issues with depression or anxiety.  4. Seizures: Continue depakote 750 mg bid. Last level @66 .0  5. ESRD: continue HD on MWFschedule.  6. Anemia of chronic disease:  7. Hyponatremia: Recheck in HD . Has been ranging from 66 yo 134 range.  8. CAD with NICM: continue : baby ASA with lipitor, coreg, and Norvasc 9.  Systolic HTN- poor control. Adjust meds- she is due for HD today. LOS (Days) 4 A FACE TO FACE EVALUATION WAS PERFORMED  Eliott Amparan T 05/08/2011, 7:31 AM

## 2011-05-08 NOTE — Progress Notes (Signed)
[facility] Progress Note  Subjective:  Denies pain; "Don't tell me I've had another stroke"  Objective Filed Vitals:   05/08/11 1030 05/08/11 1130 05/08/11 1423 05/08/11 1430  BP: 180/88 190/80 190/80 190/68  Pulse:    58  Temp:    97.7 F (36.5 C)  TempSrc:    Oral  Resp:    22  Weight:      SpO2:    100%   Physical Exam General:confused NAD Heart: RRR Lungs: no wheezes or rales Abdomen: soft NT Extremities: no LE edema Dialysis Access: left upper HERO + bruit Neuro:  At first had nonsensical rambling, then speech clear and  able to state name, [name], but thought she was at home and didn't know year  Assessment/Plan:  1.Seizure with CPR/anoxic encephalopathy in post-ictal phase - Currently seizure-free, restarted back on depakote by neurology- Currently in CIR for retraining prior to going home; Today new change in LOC disoriented; problems with word finding, balance,Head CT neg for acute change 2.ESRD - HD on MWF @ [facility]; check labs pre HD 3. Hypertension/volume - monitor blood pressures- elevated. Remains on hydralazine, amlodipine (change to HS) and carvedilol.  4. Anemia - hemoglobin 9.8; off ESA and iron; check CBC tonight and re-eval to see if we need to start 5.Metabolic bone disease - hyperphosphatemic- increased renagel dose yesterday. Continue zemplar/sensipar;  Change to 2 Ca bath = outpt bath and mild hypercalcemia 6.Malnutrition - last Alb 3.4, recently on Megace; changed to dysphagia 2 with renal restrictions; not on supplements, may need to add. 7.Depression - on Celexa.   Additional Objective Labs: Basic Metabolic Panel:  Lab 05/05/11 1610 05/03/11 0923 05/02/11 0650  NA 131* 127* 133*  K 4.3 4.3 3.8  CL 91* 89* 95*  CO2 26 28 28   GLUCOSE 96 76 96  BUN 44* 37* 19  CREATININE 8.00* 7.43* 5.24*  CALCIUM 10.6* 9.6 10.1  ALB -- -- --  PHOS 5.8* 5.9* --   Liver Function Tests:  Lab 05/05/11 1809 05/03/11 0923  AST -- --  ALT -- --    ALKPHOS -- --  BILITOT -- --  PROT -- --  ALBUMIN 2.8* 2.5*   CBC:  Lab 05/05/11 1809 05/03/11 0922  WBC 3.8* 3.9*  NEUTROABS -- --  HGB 9.8* 9.8*  HCT 29.8* 29.8*  MCV 77.8* 78.6  PLT 163 133*   BCBG:  Lab 05/08/11 1612 05/08/11 1143 05/08/11 1014 05/08/11 0815 05/08/11 0707  GLUCAP 148* 78 103* 70 75  Studies/Results: Ct Head Wo Contrast  05/08/2011  *RADIOLOGY REPORT*  Clinical Data: Confusion.  CT HEAD WITHOUT CONTRAST  Technique:  Contiguous axial images were obtained from the base of the skull through the vertex without contrast.  Comparison: CT 04/29/2011, MRI 02/20/2011  Findings: Image quality degraded by motion.  Multiple repeat images were performed due to motion.  Generalized atrophy.  Ventricle size is prominent but stable.  Chronic infarcts in the left frontal parietal lobe are unchanged. Chronic cerebellar infarcts bilaterally are unchanged. Chronic thalamic infarcts bilaterally.  Negative for acute infarct, hemorrhage, or mass lesion.  Calvarium intact.  IMPRESSION: Atrophy and chronic ischemic change.  No acute abnormality.  Original Report Authenticated By: Camelia Phenes, M.D.   Medications:   . amLODipine  10 mg Oral Daily  . aspirin EC  81 mg Oral Daily  . atorvastatin  10 mg Oral q1800  . carvedilol  25 mg Oral BID WC  . citalopram  20 mg Oral QHS  .  diphenhydrAMINE  25 mg Oral Q M,W,F-HD  . divalproex  750 mg Oral Q12H  . feeding supplement  1 Container Oral TID BM  . Fluticasone-Salmeterol  1 puff Inhalation Q12H  . hydrALAZINE  25 mg Oral Q6H  . levothyroxine  88 mcg Oral Daily  . multivitamin  1 tablet Oral QHS  . pantoprazole  40 mg Oral Q1200  . paricalcitol  7 mcg Intravenous Q M,W,F-HD  . sevelamer  2,400 mg Oral TID WC  . DISCONTD: citalopram  20 mg Oral Daily  . DISCONTD: hydrALAZINE  25 mg Oral Q8H    I  have reviewed scheduled and prn medications.  Sheffield Slider, PA-C [facility] Beeper [phone]  05/08/2011,5:24 PM   LOS: 4 days  Change in mental status today with CT showing only chronic changes  (Chronic infarcts in the left frontal parietal lobe are unchanged. Chronic cerebellar infarcts bilaterally are unchanged. Chronic thalamic infarcts bilaterally).  Negative for acute infarct, hemorrhage, or mass lesion. Will follow clinical exam. Dialysis today.

## 2011-05-08 NOTE — Progress Notes (Signed)
Patient remains confused but alert and answers questions . Patient able to move all extremities with right side slightly weaker than left . Patient able to stand mod assist due to decrease balance . Awaiting dialysis b/p 188/88 at 1830 . Continue with plan of care .                                         Cleotilde Neer

## 2011-05-08 NOTE — Plan of Care (Signed)
Problem: RH SAFETY Goal: RH STG ADHERE TO SAFETY PRECAUTIONS W/ASSISTANCE/DEVICE STG Adhere to Safety Precautions With Assistance/Device- independent  Outcome: Not Progressing Continues to require bed alarm, very confused at times and apraxic

## 2011-05-08 NOTE — Care Management Note (Signed)
Per State Regulation 482.30 This chart was reviewed for medical necessity with respect to the patient's Admission/Duration of stay. Pt w/ new confusion, balance & word finding issues,& need for constant cues.  Labs & tests being done.  Daughter here & aware.  Family ed scheduled for tomorrow is canceled.   Brock Ra                 Nurse Care Manager              Next Review Date: 05/11/11

## 2011-05-08 NOTE — Progress Notes (Signed)
Recreational Therapy Session Note  Patient Details  Name: [name] MRN: [id number] Date of Birth: 08/04/1945 Today's Date: 05/08/2011 Time: 10-1007 Pain: no c/o  Skilled Therapeutic Interventions/Progress Updates: Attempted leisure assessment at bedside.  Pt had difficulty staying awake, and answering simple questions.  Pam, PA in to assess pt.  Eval incomplete at this time.  Brittnei Jagiello 05/08/2011, 10:08 AM

## 2011-05-08 NOTE — Procedures (Signed)
I was present at this session.  I have reviewed the session itself and made appropriate changes.  BP slowly decrease with lower vol.  Still ^.  Inesha Sow L 5/6/201310:15 PM

## 2011-05-08 NOTE — Progress Notes (Signed)
Patient alert and oriented x2-3 this am . Patient able to review meds with RN . Generalized weakness noted to right upper extremity . Patient reassessed at 1000 and patient sleeping in bed arousable apraxic confused . Right side weak but moves. Cipriano Mile , PA notified . cbg rechecked for 103. Continue with plan of care.                  [name]

## 2011-05-09 ENCOUNTER — Inpatient Hospital Stay (HOSPITAL_COMMUNITY): Payer: Medicare Other

## 2011-05-09 DIAGNOSIS — R4182 Altered mental status, unspecified: Secondary | ICD-10-CM

## 2011-05-09 DIAGNOSIS — R569 Unspecified convulsions: Secondary | ICD-10-CM

## 2011-05-09 DIAGNOSIS — G931 Anoxic brain damage, not elsewhere classified: Secondary | ICD-10-CM

## 2011-05-09 DIAGNOSIS — Z5189 Encounter for other specified aftercare: Secondary | ICD-10-CM

## 2011-05-09 LAB — CBC
HCT: 30.7 % — ABNORMAL LOW (ref 36.0–46.0)
Hemoglobin: 10 g/dL — ABNORMAL LOW (ref 12.0–15.0)
MCHC: 32.6 g/dL (ref 30.0–36.0)
RDW: 16.3 % — ABNORMAL HIGH (ref 11.5–15.5)
WBC: 4.9 10*3/uL (ref 4.0–10.5)

## 2011-05-09 LAB — COMPREHENSIVE METABOLIC PANEL
CO2: 30 mEq/L (ref 19–32)
Calcium: 10.5 mg/dL (ref 8.4–10.5)
Creatinine, Ser: 3.91 mg/dL — ABNORMAL HIGH (ref 0.50–1.10)
GFR calc Af Amer: 13 mL/min — ABNORMAL LOW (ref >90)
GFR calc non Af Amer: 11 mL/min — ABNORMAL LOW (ref >90)
Glucose, Bld: 67 mg/dL — ABNORMAL LOW (ref 70–99)
Sodium: 138 mEq/L (ref 135–145)
Total Protein: 5.9 g/dL — ABNORMAL LOW (ref 6.0–8.3)

## 2011-05-09 LAB — AMMONIA: Ammonia: 36 umol/L (ref 11–60)

## 2011-05-09 LAB — TSH
TSH: 1.993 u[IU]/mL (ref 0.350–4.500)
TSH: 2.043 u[IU]/mL (ref 0.350–4.500)

## 2011-05-09 LAB — GLUCOSE, CAPILLARY
Glucose-Capillary: 71 mg/dL (ref 70–99)
Glucose-Capillary: 72 mg/dL (ref 70–99)
Glucose-Capillary: 71 mg/dL (ref 70–99)
Glucose-Capillary: 65 mg/dL — ABNORMAL LOW (ref 70–99)

## 2011-05-09 LAB — VITAMIN B12
Vitamin B-12: 788 pg/mL (ref 211–911)
Vitamin B-12: 813 pg/mL (ref 211–911)

## 2011-05-09 MED ORDER — NEPRO/CARBSTEADY PO LIQD
237.0000 mL | Freq: Three times a day (TID) | ORAL | Status: DC
  Administered 2011-05-10 – 2011-05-15 (×14): 237 mL via ORAL

## 2011-05-09 MED ORDER — LEVETIRACETAM 250 MG PO TABS
500.0000 mg | ORAL_TABLET | Freq: Every day | ORAL | Status: DC
  Administered 2011-05-09 – 2011-05-16 (×7): 500 mg via ORAL
  Filled 2011-05-09 (×9): qty 2

## 2011-05-09 MED ORDER — THIAMINE HCL 100 MG/ML IJ SOLN
200.0000 mg | Freq: Every day | INTRAMUSCULAR | Status: AC
  Administered 2011-05-10 – 2011-05-12 (×3): 200 mg via INTRAVENOUS
  Filled 2011-05-09 (×3): qty 2

## 2011-05-09 MED ORDER — DARBEPOETIN ALFA-POLYSORBATE 25 MCG/0.42ML IJ SOLN: 6.2500 ug | INTRAMUSCULAR | Status: DC

## 2011-05-09 MED ORDER — THIAMINE HCL 100 MG/ML IJ SOLN
100.0000 mg | Freq: Every day | INTRAMUSCULAR | Status: AC
  Administered 2011-05-13: 100 mg via INTRAVENOUS
  Filled 2011-05-09 (×3): qty 1

## 2011-05-09 MED ORDER — DIVALPROEX SODIUM ER 500 MG PO TB24
500.0000 mg | ORAL_TABLET | Freq: Every day | ORAL | Status: DC
  Filled 2011-05-09: qty 1

## 2011-05-09 MED ORDER — VITAMIN B-1 100 MG PO TABS
100.0000 mg | ORAL_TABLET | Freq: Every day | ORAL | Status: DC
  Filled 2011-05-09 (×2): qty 1

## 2011-05-09 MED ORDER — LORAZEPAM 2 MG/ML IJ SOLN
INTRAMUSCULAR | Status: AC
  Administered 2011-05-09: 1 mg
  Filled 2011-05-09: qty 1

## 2011-05-09 MED ORDER — THIAMINE HCL 100 MG/ML IJ SOLN
500.0000 mg | Freq: Once | INTRAVENOUS | Status: AC
  Administered 2011-05-09: 500 mg via INTRAVENOUS
  Filled 2011-05-09: qty 5

## 2011-05-09 NOTE — Procedures (Signed)
EEG NUMBER:  [id number]  This routine EEG was requested in this 66 year old female who was admitted following cardiac arrest and seizure during dialysis.  She also has a history of seizures.  The purpose of this EEG is that she has had a change in mental status and the providers would like to rule out subclinical seizure activity.  Medications include Ultram and Celexa.  EEG was done with the patient briefly awake, but confused, and predominantly drowsy.  During periods of maximal wakefulness, there was no clear alpha rhythm.  There was a posterior dominant rhythm that was approximately 8 cycles per second.  This was superimposed upon poorly organized diffuse alpha and some theta activities.  Her wakeful rhythms quickly interrupted when the patient was not stimulated by the appearance of medium to high amplitude diffuse semi-rhythmic delta activities.  Photic stimulation produced a driving response that was slightly better seen over the right hemisphere.  Hyperventilation was not performed due to the patient's inability to comply.    Much of the tracing was spent in a drowsy state.  This was marked by diffuse poorly organized delta and theta activities.  These were of medium to high amplitude.  Superimposed upon this were semi- rhythmic alpha activities that did seem to be of low amplitude and seen best in the central regions.  Note was made of poorly formed vertex sharp waves as well that were symmetric.  EKG reveals normal sinus rhythm.  CLINICAL INTERPRETATION:  This routine EEG done with the patient briefly awake, but predominantly drowsy is abnormal.  During wakefulness, her background activities are slightly too slow suggesting a mild encephalopathy of nonspecific etiology.  In addition, the mild asymmetry photic driving possibly suggests that she may have an underlying structural or functional lesion in the left hemisphere.  There were no interictal epileptiform  discharges, electrographic seizures, or nonconvulsive status epilepticus seen.          ______________________________ Denton Meek, MD    XB:JYNW D:  05/09/2011 12:15:35  T:  05/09/2011 12:40:05  Job #:  [id number]

## 2011-05-09 NOTE — Progress Notes (Signed)
SLP Cancellation Note  Pt missed 45 minutes of skilled SLP treatment today due to pt unable to be aroused for therapy. Pt will open eyes but instantly fall back asleep. RN aware. Nurse tech checked pt's blood sugar, pt aroused but then quickly fell asleep again.   Taylin Mans 05/09/2011, 11:59 AM

## 2011-05-09 NOTE — Progress Notes (Signed)
Subjective:  Somnolent, and confused; didn't recognize me which is not normal for her; but able to follow simple commands and denies pain or discomfort Also did not recall having a conversation with either me or Bard Herbert yesterday  Vital signs in last 24 hours: Filed Vitals:   05/09/11 0014 05/09/11 0345 05/09/11 0618 05/09/11 0806  BP: 170/53 184/55 169/61   Pulse:  69 77   Temp:  98.7 F (37.1 C) 98.8 F (37.1 C)   TempSrc:  Oral Oral   Resp:  18 18   Weight:   62.9 kg (138 lb 10.7 oz)   SpO2:  100% 100% 96%    Intake/Output Summary (Last 24 hours) at 05/09/11 1336 Last data filed at 05/09/11 0900  Gross per 24 hour  Intake    240 ml  Output   3500 ml (HD)  Net  -3260 ml   Labs: Basic Metabolic Panel:  Lab 05/09/11 1610 05/08/11 1923 05/05/11 1809 05/03/11 0923  NA 138 133* 131* --  K 3.5 4.2 4.3 --  CL 99 95* 91* --  CO2 30 23 26  --  GLUCOSE 67* 71 96 --  BUN 12 47* 44* --  CREATININE 3.91* 8.48* 8.00* --  CALCIUM 10.5 10.6* 10.6* --  ALB -- -- -- --  PHOS -- 5.1* 5.8* 5.9*   Liver Function Tests:  Lab 05/09/11 0610 05/08/11 1923 05/05/11 1809  AST 16 -- --  ALT 8 -- --  ALKPHOS 80 -- --  BILITOT 0.2* -- --  PROT 5.9* -- --  ALBUMIN 2.8* 2.8* 2.8*   No results found for this basename: LIPASE:3,AMYLASE:3 in the last 168 hours  Lab 05/09/11 0610  AMMONIA 36   CBC:  Lab 05/09/11 0610 05/08/11 1923 05/05/11 1809 05/03/11 0922  WBC 4.9 3.7* 3.8* --  NEUTROABS -- -- -- --  HGB 10.0* 9.4* 9.8* --  HCT 30.7* 28.4* 29.8* --  MCV 77.7* 76.3* 77.8* 78.6  PLT 192 157 163 --   Cardiac Enzymes: No results found for this basename: CKTOTAL:5,CKMB:5,CKMBINDEX:5,TROPONINI:5 in the last 168 hours CBG:  Lab 05/09/11 1121 05/09/11 0726 05/08/11 2342 05/08/11 1612 05/08/11 1143  GLUCAP 71 72 71 148* 78    Ct Head Wo Contrast  05/08/2011  *RADIOLOGY REPORT*  Clinical Data: Confusion.  CT HEAD WITHOUT CONTRAST  Technique:  Contiguous axial images were obtained  from the base of the skull through the vertex without contrast.  Comparison: CT 04/29/2011, MRI 02/20/2011  Findings: Image quality degraded by motion.  Multiple repeat images were performed due to motion.  Generalized atrophy.  Ventricle size is prominent but stable.  Chronic infarcts in the left frontal parietal lobe are unchanged. Chronic cerebellar infarcts bilaterally are unchanged. Chronic thalamic infarcts bilaterally.  Negative for acute infarct, hemorrhage, or mass lesion.  Calvarium intact.  IMPRESSION: Atrophy and chronic ischemic change.  No acute abnormality.  Original Report Authenticated By: Camelia Phenes, M.D.   Medications:   I  have reviewed scheduled and prn medications . amLODipine  10 mg Oral Daily  . aspirin EC  81 mg Oral Daily  . atorvastatin  10 mg Oral q1800  . carvedilol  25 mg Oral BID WC  . citalopram  20 mg Oral QHS  . diphenhydrAMINE  25 mg Oral Q M,W,F-HD  . divalproex  500 mg Oral QHS  . feeding supplement (NEPRO CARB STEADY)  237 mL Oral TID WC  . Fluticasone-Salmeterol  1 puff Inhalation Q12H  . hydrALAZINE  25  mg Oral Q6H  . levothyroxine  88 mcg Oral Daily  . LORazepam      . multivitamin  1 tablet Oral QHS  . pantoprazole  40 mg Oral Q1200  . sevelamer  2,400 mg Oral TID WC  . DISCONTD: amLODipine  10 mg Oral Daily  . DISCONTD: darbepoetin (ARANESP) injection - DIALYSIS  6.5476 mcg Intravenous Q Wed-HD  . DISCONTD: divalproex  750 mg Oral Q12H  . DISCONTD: feeding supplement  1 Container Oral TID BM  . DISCONTD: paricalcitol  7 mcg Intravenous Q M,W,F-HD  .  Physical Exam  General:confused, NAD  Heart: RRR  Lungs: no wheezes or rales  Abdomen: soft NT, +BS  Extremities: no LE edema  Dialysis Access: left upper HERO + bruit  Neuro: somnolent, confused, and slow to respond     Assessment/Plan:  1.Seizure with CPR/anoxic encephalopathy in post-ictal phase - no active seizure activity; on depakote until today - decreased by primary service in  case could be causing her neuro sx with level ordered; new change LOC yesterday; became disoriented, still having expressive aphasia; Head CT neg for acute change; s/p EEG to rule out subclinical seizure activity- results negative for interictal epileptiform discharges, electrographic seizures, or nonconvulsive status epilepticus seen; Neuro following   2.ESRD - HD on MWF @ [facility]; next treatment tomorrow with labs pre-tx  3. Hypokalemia- K 3.5, replete with HD; follow 4. Hypertension/volume - above goal but has been difficult to manage in the outpt setting as well (currently on hydralazine, amlodipine (change to HS) and carvedilol); max UF with HD; follow.  5. Anemia - hemoglobin ^ 10; off ESA and iron; add Aranesp 6.25 mcg qwk for now and follow closely   6.Metabolic bone disease - phos 5.1, ca 10.5 (corrected ca+ 11.46); on Renvela, Sensipar and Zemplar ( ); will d/c Zemplar, cont Sensipar, and use 2.0 Ca bath  7.Malnutrition - Alb 2.8, on dysphagia 2 with renal restrictions; change Resource to Nepro scheduled TIDWC; follow   8. HLD- on Lipitor 9. Depression - on Celexa.  10. Hypothyrodism- on synthroid with TSH level pending 11. Disposition- per primary services      Samuel Germany, FNP-C [facility] Pager [phone]  05/09/2011,1:36 PM  LOS: 5 days  I concur with note above with highlighted additions.

## 2011-05-09 NOTE — Progress Notes (Signed)
Physical Therapy Session Note  Patient Details  Name: [name] MRN: [id number] Date of Birth: 02-04-1945  Today's Date: 05/09/2011 Time: 2440-1027 Time Calculation (min): 44 min ( missed due to EEG procedure)  Short Term Goals: Week 1:  PT Short Term Goal 1 (Week 1): STG = LTG  Skilled Therapeutic Interventions/Progress Updates:  Pt very lethargic today, therapist had to assist pt up to sitting to arouse and pt continued to slump forward in sitting several times during session. 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 Pt needing encouragement to participate today.  Supine>sit with Mod A for lifting, sit>supine Min A.  Sitting balance edge of bed, reaching outside BOS in all directions with Min A for steadying. Also instructed pt in seated ankle pumps, LAQ, and marching, 2x10 bil with demonstration and verbal cues for technique. Even with these cues, pt unable to follow commands completely. Attempted to reassess strength, but pt unable to follow instructions for MMT, but was abel to  Observe motion against gravity in both LEs, L possibly less than R. Seated scooting up/down side of bed with Min A.  Sit<>stand x8 with Min A for steadying. Static standing balance x5 attempts, pt tending to lean posteriorly with no righting reactions, ending in sit on bed.  Dynamic standing balance in RW for marching and mini-squats each bil x10. Pt tending to lean L somewhat. Stood in Location manager for visual feedback with good results.  Gait in room 4x10' with RW and Mod A for steadying with turns, and RW management. Cues for posture and foot clearance.  Pt returned to bed for EEG procedure to assess change in status.      Therapy Documentation Precautions:  Precautions Precautions: Fall Precaution Comments: siezure precautions Restrictions Weight Bearing Restrictions: No General:   Vital  Signs: Oxygen Therapy SpO2: 96 % O2 Device: None (Room air) Pain: no c/o pain   See FIM for current functional status  Therapy/Group: Individual Therapy  Virl Cagey, PT 05/09/2011, 10:30 AM

## 2011-05-09 NOTE — Progress Notes (Signed)
Subjective/Complaints: Lethargic and confused yesterday. Awake this am, perhaps a little brighter than yesterday am. Review of Systems  Constitutional: Positive for malaise/fatigue.  All other systems reviewed and are negative.     Objective: Vital Signs: Blood pressure 169/61, pulse 77, temperature 98.8 F (37.1 C), temperature source Oral, resp. rate 18, weight 62.9 kg (138 lb 10.7 oz), SpO2 100.00%. Ct Head Wo Contrast  05/08/2011  *RADIOLOGY REPORT*  Clinical Data: Confusion.  CT HEAD WITHOUT CONTRAST  Technique:  Contiguous axial images were obtained from the base of the skull through the vertex without contrast.  Comparison: CT 04/29/2011, MRI 02/20/2011  Findings: Image quality degraded by motion.  Multiple repeat images were performed due to motion.  Generalized atrophy.  Ventricle size is prominent but stable.  Chronic infarcts in the left frontal parietal lobe are unchanged. Chronic cerebellar infarcts bilaterally are unchanged. Chronic thalamic infarcts bilaterally.  Negative for acute infarct, hemorrhage, or mass lesion.  Calvarium intact.  IMPRESSION: Atrophy and chronic ischemic change.  No acute abnormality.  Original Report Authenticated By: Camelia Phenes, M.D.    Basename 05/09/11 0610 05/08/11 1923  WBC 4.9 3.7*  HGB 10.0* 9.4*  HCT 30.7* 28.4*  PLT 192 157    Basename 05/09/11 0610 05/08/11 1923  NA 138 133*  K 3.5 4.2  CL 99 95*  CO2 30 23  GLUCOSE 67* 71  BUN 12 47*  CREATININE 3.91* 8.48*  CALCIUM 10.5 10.6*   CBG (last 3)   Basename 05/08/11 2342 05/08/11 1612 05/08/11 1143  GLUCAP 71 148* 78    Wt Readings from Last 3 Encounters:  05/09/11 62.9 kg (138 lb 10.7 oz)  05/04/11 67.042 kg (147 lb 12.8 oz)  02/27/11 67.5 kg (148 lb 13 oz)    Physical Exam:  General appearance: alert, cooperative and no distress Head: Normocephalic, without obvious abnormality, atraumatic Eyes: EOMI,PERRLA Ears: Ext nl Nose: Nares normal. Septum midline. Mucosa  normal. No drainage or sinus tenderness. Throat: lips, mucosa, and tongue normal; teeth and gums normal Neck: no adenopathy, no carotid bruit, no JVD, supple, symmetrical, trachea midline and thyroid not enlarged, symmetric, no tenderness/mass/nodules Back: symmetric, no curvature. ROM normal. No CVA tenderness. Resp: clear to auscultation bilaterally Cardio: regular rate and rhythm, S1, S2 normal, no murmur, click, rub or gallop GI: soft, non-tender; bowel sounds normal; no masses,  no organomegaly Extremities: extremities normal, atraumatic, no cyanosis or edema Pulses: 2+ and symmetric Skin: Skin color, texture, turgor normal. No rashes or lesions Neurologic: slow to arouse this am, limited insight. Speech slurred. Poor memory and awareness. Moves all 4's.  Incision/Wound:    Assessment/Plan: 1. Functional deficits secondary to seizure, cardiac arrest, anoxic encephalopathy which require 3+ hours per day of interdisciplinary therapy in a comprehensive inpatient rehab setting. Physiatrist is providing close team supervision and 24 hour management of active medical problems listed below. Physiatrist and rehab team continue to assess barriers to discharge/monitor patient progress toward functional and medical goals. FIM: FIM - Bathing Bathing Steps Patient Completed: Chest;Abdomen;Right upper leg;Left upper leg;Right lower leg (including foot);Left lower leg (including foot);Right Arm Bathing: 3: Mod-Patient completes 5-7 84f 10 parts or 50-74%  FIM - Upper Body Dressing/Undressing Upper body dressing/undressing steps patient completed: Thread/unthread right sleeve of pullover shirt/dresss;Thread/unthread left sleeve of pullover shirt/dress Upper body dressing/undressing: 2: Max-Patient completed 25-49% of tasks FIM - Lower Body Dressing/Undressing Lower body dressing/undressing steps patient completed: Pull underwear up/down;Don/Doff right sock;Don/Doff left sock Lower body  dressing/undressing: 2: Max-Patient completed 25-49% of  tasks  FIM - Toileting Toileting steps completed by patient: Adjust clothing prior to toileting;Performs perineal hygiene;Adjust clothing after toileting Toileting Assistive Devices: Grab bar or rail for support Toileting: 0: Activity did not occur  FIM - Diplomatic Services operational officer Devices: Grab bars Toilet Transfers: 4-To toilet/BSC: Min A (steadying Pt. > 75%);4-From toilet/BSC: Min A (steadying Pt. > 75%)  FIM - Bed/Chair Transfer Bed/Chair Transfer Assistive Devices: Bed rails;HOB elevated Bed/Chair Transfer: 4: Sit > Supine: Min A (steadying pt. > 75%/lift 1 leg);4: Bed > Chair or W/C: Min A (steadying Pt. > 75%)  FIM - Locomotion: Wheelchair Locomotion: Wheelchair: 1: Total Assistance/staff pushes wheelchair (Pt<25%) FIM - Locomotion: Ambulation Locomotion: Ambulation Assistive Devices: Other (comment) (HHA) Ambulation/Gait Assistance: 3: Mod assist Locomotion: Ambulation: 2: Travels 50 - 149 ft with moderate assistance (Pt: 50 - 74%)  Comprehension Comprehension Mode: Auditory Comprehension: 2-Understands basic 25 - 49% of the time/requires cueing 51 - 75% of the time  Expression Expression Mode: Verbal Expression: 2-Expresses basic 25 - 49% of the time/requires cueing 50 - 75% of the time. Uses single words/gestures.  Social Interaction Social Interaction: 1-Interacts appropriately less than 25% of the time. May be withdrawn or combative.  Problem Solving Problem Solving: 1-Solves basic less than 25% of the time - needs direction nearly all the time or does not effectively solve problems and may need a restraint for safety  Memory Memory: 1-Recognizes or recalls less than 25% of the time/requires cueing greater than 75% of the time  1. DVT Prophylaxis/Anticoagulation: Mechanical: Sequential compression devices, below knee Bilateral lower extremities  2. Pain Management: Monitor. Denies pain  today 3. Mood: denies any issues with depression or anxiety.  4. Seizures: Continue depakote 750 mg bid. Last level @67 .0  5. ESRD: continue HD on MWFschedule.  6. Anemia of chronic disease:  7. Hyponatremia: Recheck in HD . Has been ranging from 66 yo 134 range.  8. CAD with NICM: continue : baby ASA with lipitor, coreg, and Norvasc 9.  Systolic HTN- poor control. Adjust meds- she is due for HD today. 10. AMS- work up really unremarkable so far. MRI pending for this am.  -lab work pending today.  -i wonder if this is an intolerance to the vpa? Will hold this am and d/w neuro  -she has had problems with dilantin in the past  -reviewed plan with patient and daughter. LOS (Days) 5 A FACE TO FACE EVALUATION WAS PERFORMED  Rodney Wigger T 05/09/2011, 7:18 AM

## 2011-05-09 NOTE — Progress Notes (Signed)
Physical Therapy Note  Patient Details  Name: [name] MRN: [id number] Date of Birth: 04-18-1945 Today's Date: 05/09/2011  Pt having medical work up today d/t change in status (confusion, weakness and decreased balance), will retry PT tomorrow. Missed 30 min.   Michaelene Song 05/09/2011, 2:28 PM

## 2011-05-09 NOTE — Progress Notes (Signed)
Occupational Therapy Note  Patient Details  Name: [name] MRN: [id number] Date of Birth: 09-12-1945 Today's Date: 05/09/2011  Missed 45 min:  1030-1115  Patient had episode yesterday of increased confusion, speech unintelligibility, decreased balance and coordination.  Work-up continues today.  Patient receiving EEG in room during scheduled OT time.  Patient scheduled for MRI later today, will need sedation prior.  Patient not able to be rescheduled for OT, secondary to medical work up.  Will need to reassess long term goals, and estimated length of stay, once information obtained from various tests.   Collier Salina 05/09/2011, 12:17 PM

## 2011-05-09 NOTE — Consult Note (Signed)
[facility] CONSULT NOTE     Reason for Consult:    HPI:    [name] is an 66 y.o. female initally seen on 04/29/11 by neurology for a GTC seizure during dialysis and had cardiac arrest with subsequent defibrillation.  Patient was started on Valproic acid.  Patient was doing well and transferred to CIR.  While in CIR she was handel ing VPA well, seizure free. On 5/6 2013 she was noted by nurse to have generalized weakness but  right upper extremity appeared weaker. CBG was noted to be 103.  Blood pressure on 5/6 was persistently elevated 170's to 206 systolic.   I have spoken with daughter who states a long time ago she was on Dilantin but had a side effect --"she had a stroke on dilantin" and this was stopped.  After she was placed on another AED but she does not know the name.  She was taken off the medication after a period of being seizure free. While on Depakote in hospital she was doing well but daughter has recently noted patient will intermittently slur words or just not be herself.  The question of medication side effect was brought up and neurology was asked to see patient.    Past Medical History  Diagnosis Date  . CAD (coronary artery disease)     stent to RCA  . CVA (cerebral infarction) 2003    no apparent residual  . Renal failure     related to hypertension  . Hypothyroidism   . PVD (peripheral vascular disease)   . Hyperlipidemia   . Positive PPD     completed rifampin  . Diastolic congestive heart failure   . Renal insufficiency   . Hypertension   . COPD (chronic obstructive pulmonary disease) t  . Cancer     clear cell cancer, kidney  . Complication of anesthesia 12/2010    pt is very confused, with AMS with anesthesia    Past Surgical History  Procedure Date  . Abdominal hysterectomy   . Cholecystectomy   . D&cs   . Right ankle repair   . Left nephrectomy   . Vascular surgery 11/2010    graft inserted to left arm      Family History  Problem Relation Age of Onset  . Heart disease Father   . Hypertension Mother   . Dementia Mother   . Coronary artery disease Sister     Social History:  reports that she has been smoking Cigarettes.  She has a 106 pack-year smoking history. She does not have any smokeless tobacco history on file. She reports that she does not drink alcohol or use illicit drugs.  Allergies  Allergen Reactions  . Iohexol Swelling and Other (See Comments)    1970s; passed out and had facial/tongue swelling.  Requires 13-hour prep with prednisone and benadryl  . Ampicillin Hives and Rash  . Meperidine Hcl Swelling and Rash    Makes tongue swell  . Morphine Rash  . Penicillins Rash  . Heparin     MDs told her not to take after reaction in ICU  . Pentazocine Lactate     Patient does not remember reaction to this med (Talwin).   . Phenytoin Other (See Comments)    Had reaction while in ICU; doesn't know.  MDs told her not to take ever again.  [name] Ace  Inhibitors Rash    Medications:    Scheduled:   . amLODipine  10 mg Oral Daily  . aspirin EC  81 mg Oral Daily  . atorvastatin  10 mg Oral q1800  . carvedilol  25 mg Oral BID WC  . citalopram  20 mg Oral QHS  . diphenhydrAMINE  25 mg Oral Q M,W,F-HD  . feeding supplement  1 Container Oral TID BM  . Fluticasone-Salmeterol  1 puff Inhalation Q12H  . hydrALAZINE  25 mg Oral Q6H  . levothyroxine  88 mcg Oral Daily  . multivitamin  1 tablet Oral QHS  . pantoprazole  40 mg Oral Q1200  . paricalcitol  7 mcg Intravenous Q M,W,F-HD  . sevelamer  2,400 mg Oral TID WC  . DISCONTD: amLODipine  10 mg Oral Daily  . DISCONTD: divalproex  750 mg Oral Q12H    Review of Systems -   Blood pressure 169/61, pulse 77, temperature 98.8 F (37.1 C), temperature source Oral, resp. rate 18, weight 62.9 kg (138 lb 10.7 oz), SpO2 96.00%.   Neurologic Examination:  Mental Status: Alert, oriented to hospital but believes it is 2005.  She is  able to follow commands but is slow to process information and often time will drift off.  She tells me she feels "swimmy headed".  When asked to touch her nose it would take multiple vocal commands for her to actually do what I asked.   Speech fluent without evidence of aphasia. Cranial Nerves: II-Visual fields grossly intact. III/IV/VI-Extraocular movements intact.  Pupils reactive bilaterally. V/VII-Smile symmetric VIII-grossly intact IX/X-normal gag XI-bilateral shoulder shrug XII-midline tongue extension Motor: 5/5 bilaterally with normal tone and bulk Sensory: Pinprick and light touch intact throughout, bilaterally Deep Tendon Reflexes: 2+ and symmetric throughout Plantars: Downgoing bilaterally Cerebellar: Normal finger-to-nose, normal rapid alternating movements and normal heel-to-shin test.  Normal gait and station.    Results for orders placed during the hospital encounter of 05/04/11 (from the past 24 hour(s))  GLUCOSE, CAPILLARY     Status: Abnormal   Collection Time   05/08/11  4:12 PM      Component Value Range   Glucose-Capillary 148 (*) 70 - 99 (mg/dL)  CBC     Status: Abnormal   Collection Time   05/08/11  7:23 PM      Component Value Range   WBC 3.7 (*) 4.0 - 10.5 (K/uL)   RBC 3.72 (*) 3.87 - 5.11 (MIL/uL)   Hemoglobin 9.4 (*) 12.0 - 15.0 (g/dL)   HCT 11.9 (*) 14.7 - 46.0 (%)   MCV 76.3 (*) 78.0 - 100.0 (fL)   MCH 25.3 (*) 26.0 - 34.0 (pg)   MCHC 33.1  30.0 - 36.0 (g/dL)   RDW 82.9 (*) 56.2 - 15.5 (%)   Platelets 157  150 - 400 (K/uL)  RENAL FUNCTION PANEL     Status: Abnormal   Collection Time   05/08/11  7:23 PM      Component Value Range   Sodium 133 (*) 135 - 145 (mEq/L)   Potassium 4.2  3.5 - 5.1 (mEq/L)   Chloride 95 (*) 96 - 112 (mEq/L)   CO2 23  19 - 32 (mEq/L)   Glucose, Bld 71  70 - 99 (mg/dL)   BUN 47 (*) 6 - 23 (mg/dL)   Creatinine, Ser 1.30 (*) 0.50 - 1.10 (mg/dL)   Calcium 86.5 (*) 8.4 - 10.5 (mg/dL)   Phosphorus 5.1 (*) 2.3 - 4.6 (mg/dL)    Albumin 2.8 (*)  3.5 - 5.2 (g/dL)   GFR calc non Af Amer 4 (*) >90 (mL/min)   GFR calc Af Amer 5 (*) >90 (mL/min)  GLUCOSE, CAPILLARY     Status: Normal   Collection Time   05/08/11 11:42 PM      Component Value Range   Glucose-Capillary 71  70 - 99 (mg/dL)  CBC     Status: Abnormal   Collection Time   05/09/11  6:10 AM      Component Value Range   WBC 4.9  4.0 - 10.5 (K/uL)   RBC 3.95  3.87 - 5.11 (MIL/uL)   Hemoglobin 10.0 (*) 12.0 - 15.0 (g/dL)   HCT 16.1 (*) 09.6 - 46.0 (%)   MCV 77.7 (*) 78.0 - 100.0 (fL)   MCH 25.3 (*) 26.0 - 34.0 (pg)   MCHC 32.6  30.0 - 36.0 (g/dL)   RDW 04.5 (*) 40.9 - 15.5 (%)   Platelets 192  150 - 400 (K/uL)  AMMONIA     Status: Normal   Collection Time   05/09/11  6:10 AM      Component Value Range   Ammonia 36  11 - 60 (umol/L)  COMPREHENSIVE METABOLIC PANEL     Status: Abnormal   Collection Time   05/09/11  6:10 AM      Component Value Range   Sodium 138  135 - 145 (mEq/L)   Potassium 3.5  3.5 - 5.1 (mEq/L)   Chloride 99  96 - 112 (mEq/L)   CO2 30  19 - 32 (mEq/L)   Glucose, Bld 67 (*) 70 - 99 (mg/dL)   BUN 12  6 - 23 (mg/dL)   Creatinine, Ser 8.11 (*) 0.50 - 1.10 (mg/dL)   Calcium 91.4  8.4 - 10.5 (mg/dL)   Total Protein 5.9 (*) 6.0 - 8.3 (g/dL)   Albumin 2.8 (*) 3.5 - 5.2 (g/dL)   AST 16  0 - 37 (U/L)   ALT 8  0 - 35 (U/L)   Alkaline Phosphatase 80  39 - 117 (U/L)   Total Bilirubin 0.2 (*) 0.3 - 1.2 (mg/dL)   GFR calc non Af Amer 11 (*) >90 (mL/min)   GFR calc Af Amer 13 (*) >90 (mL/min)  GLUCOSE, CAPILLARY     Status: Normal   Collection Time   05/09/11  7:26 AM      Component Value Range   Glucose-Capillary 72  70 - 99 (mg/dL)   Comment 1 Notify RN    GLUCOSE, CAPILLARY     Status: Normal   Collection Time   05/09/11 11:21 AM      Component Value Range   Glucose-Capillary 71  70 - 99 (mg/dL)   Comment 1 Notify RN      Results for orders placed during the hospital encounter of 05/04/11 (from the past 48 hour(s))  GLUCOSE, CAPILLARY      Status: Normal   Collection Time   05/08/11  7:07 AM      Component Value Range Comment   Glucose-Capillary 75  70 - 99 (mg/dL)    Comment 1 Notify RN     GLUCOSE, CAPILLARY     Status: Normal   Collection Time   05/08/11  8:15 AM      Component Value Range Comment   Glucose-Capillary 70  70 - 99 (mg/dL)    Comment 1 Notify RN     GLUCOSE, CAPILLARY     Status: Abnormal   Collection Time   05/08/11 10:14 AM  Component Value Range Comment   Glucose-Capillary 103 (*) 70 - 99 (mg/dL)    Comment 1 Notify RN     VALPROIC ACID LEVEL     Status: Normal   Collection Time   05/08/11 10:22 AM      Component Value Range Comment   Valproic Acid Lvl 72.6  50.0 - 100.0 (ug/mL)   GLUCOSE, CAPILLARY     Status: Normal   Collection Time   05/08/11 11:43 AM      Component Value Range Comment   Glucose-Capillary 78  70 - 99 (mg/dL)    Comment 1 Notify RN     GLUCOSE, CAPILLARY     Status: Abnormal   Collection Time   05/08/11  4:12 PM      Component Value Range Comment   Glucose-Capillary 148 (*) 70 - 99 (mg/dL)   CBC     Status: Abnormal   Collection Time   05/08/11  7:23 PM      Component Value Range Comment   WBC 3.7 (*) 4.0 - 10.5 (K/uL)    RBC 3.72 (*) 3.87 - 5.11 (MIL/uL)    Hemoglobin 9.4 (*) 12.0 - 15.0 (g/dL)    HCT 16.1 (*) 09.6 - 46.0 (%)    MCV 76.3 (*) 78.0 - 100.0 (fL)    MCH 25.3 (*) 26.0 - 34.0 (pg)    MCHC 33.1  30.0 - 36.0 (g/dL)    RDW 04.5 (*) 40.9 - 15.5 (%)    Platelets 157  150 - 400 (K/uL)   RENAL FUNCTION PANEL     Status: Abnormal   Collection Time   05/08/11  7:23 PM      Component Value Range Comment   Sodium 133 (*) 135 - 145 (mEq/L)    Potassium 4.2  3.5 - 5.1 (mEq/L)    Chloride 95 (*) 96 - 112 (mEq/L)    CO2 23  19 - 32 (mEq/L)    Glucose, Bld 71  70 - 99 (mg/dL)    BUN 47 (*) 6 - 23 (mg/dL)    Creatinine, Ser 8.11 (*) 0.50 - 1.10 (mg/dL)    Calcium 91.4 (*) 8.4 - 10.5 (mg/dL)    Phosphorus 5.1 (*) 2.3 - 4.6 (mg/dL)    Albumin 2.8 (*) 3.5 - 5.2 (g/dL)     GFR calc non Af Amer 4 (*) >90 (mL/min)    GFR calc Af Amer 5 (*) >90 (mL/min)   GLUCOSE, CAPILLARY     Status: Normal   Collection Time   05/08/11 11:42 PM      Component Value Range Comment   Glucose-Capillary 71  70 - 99 (mg/dL)   CBC     Status: Abnormal   Collection Time   05/09/11  6:10 AM      Component Value Range Comment   WBC 4.9  4.0 - 10.5 (K/uL)    RBC 3.95  3.87 - 5.11 (MIL/uL)    Hemoglobin 10.0 (*) 12.0 - 15.0 (g/dL)    HCT 78.2 (*) 95.6 - 46.0 (%)    MCV 77.7 (*) 78.0 - 100.0 (fL)    MCH 25.3 (*) 26.0 - 34.0 (pg)    MCHC 32.6  30.0 - 36.0 (g/dL)    RDW 21.3 (*) 08.6 - 15.5 (%)    Platelets 192  150 - 400 (K/uL)   AMMONIA     Status: Normal   Collection Time   05/09/11  6:10 AM      Component Value Range Comment  Ammonia 36  11 - 60 (umol/L)   COMPREHENSIVE METABOLIC PANEL     Status: Abnormal   Collection Time   05/09/11  6:10 AM      Component Value Range Comment   Sodium 138  135 - 145 (mEq/L)    Potassium 3.5  3.5 - 5.1 (mEq/L)    Chloride 99  96 - 112 (mEq/L)    CO2 30  19 - 32 (mEq/L)    Glucose, Bld 67 (*) 70 - 99 (mg/dL)    BUN 12  6 - 23 (mg/dL)    Creatinine, Ser 0.86 (*) 0.50 - 1.10 (mg/dL) DELTA CHECK NOTED   Calcium 10.5  8.4 - 10.5 (mg/dL)    Total Protein 5.9 (*) 6.0 - 8.3 (g/dL)    Albumin 2.8 (*) 3.5 - 5.2 (g/dL)    AST 16  0 - 37 (U/L)    ALT 8  0 - 35 (U/L)    Alkaline Phosphatase 80  39 - 117 (U/L)    Total Bilirubin 0.2 (*) 0.3 - 1.2 (mg/dL)    GFR calc non Af Amer 11 (*) >90 (mL/min)    GFR calc Af Amer 13 (*) >90 (mL/min)   GLUCOSE, CAPILLARY     Status: Normal   Collection Time   05/09/11  7:26 AM      Component Value Range Comment   Glucose-Capillary 72  70 - 99 (mg/dL)    Comment 1 Notify RN       Ct Head Wo Contrast  05/08/2011  *RADIOLOGY REPORT*  Clinical Data: Confusion.  CT HEAD WITHOUT CONTRAST  Technique:  Contiguous axial images were obtained from the base of the skull through the vertex without contrast.  Comparison: CT  04/29/2011, MRI 02/20/2011  Findings: Image quality degraded by motion.  Multiple repeat images were performed due to motion.  Generalized atrophy.  Ventricle size is prominent but stable.  Chronic infarcts in the left frontal parietal lobe are unchanged. Chronic cerebellar infarcts bilaterally are unchanged. Chronic thalamic infarcts bilaterally.  Negative for acute infarct, hemorrhage, or mass lesion.  Calvarium intact.  IMPRESSION: Atrophy and chronic ischemic change.  No acute abnormality.  Original Report Authenticated By: Camelia Phenes, M.D.   EEG 05/09/11--During wakefulness, her background activities are slightly too slow suggesting a mild encephalopathy of nonspecific etiology. In addition, the mild asymmetry photic driving possibly suggests that she may have an underlying  structural or functional lesion in the left hemisphere. There were no interictal epileptiform discharges, electrographic seizures, or nonconvulsive status epilepticus seen.   Assessment/Plan:   66 YO female with known seizure history. She had been on Depakote since 4/27 and remained seizure free.  Recently she has showed intermittent slurred speech, generalized weakness and right>left weakness. Daughter noted it seems to be after she received her Depakote.  Depakote can cause sedation and there is the possibility this is a medication SE. In addition she was noted to have elevated SBP during date her symptoms started and cannot rule out new acute stroke given her risk factors.   I have discussed patient with Dr. Bettey Costa and she has seen the patient and agrees with the above mentioned.   Recommend: 1) MRI brain to evaluate for stroke 2) Will change Depakote ER dose of 500 mg  Daily q HS--this will allow a slower release.    Felicie Morn PA-C [facility] [phone]  05/09/2011, 11:37 AM  MRI personally reviewed.  EEG noted.  Labs reviewed.  Meds reviewed.  No  current obvious etiology.  No new stroke.  No  ongoing seizure.  As she is an ESRD patient on HD, Wernicke's encephalopathy possible.  As a trial, will start IV thiamine.   Hayden Rasmussen, MD  [facility]

## 2011-05-09 NOTE — Progress Notes (Signed)
Late entry. Patient arousable throughout day . Patient noted to speak clearer today but remains confused. Patient ate bacon this am prior to MRI and required to be rescheduled. Patient npo throughout day . Patient lethargic at times and am meds held due to decreased alertness . Patient would awaken for  Only minutes and be very alert and then go to sleep and very difficult to arouse. cbg this pm 58 and apple juice given and cbg rechecked for 65. Patient feed dinner by staff and cbg up to 106. Patient sleeping at 1830 . Daughter in room . Continue with plan of care.         Cleotilde Neer

## 2011-05-10 ENCOUNTER — Inpatient Hospital Stay (HOSPITAL_COMMUNITY): Payer: Medicare Other

## 2011-05-10 DIAGNOSIS — G931 Anoxic brain damage, not elsewhere classified: Secondary | ICD-10-CM

## 2011-05-10 DIAGNOSIS — Z5189 Encounter for other specified aftercare: Secondary | ICD-10-CM

## 2011-05-10 LAB — CBC
HCT: 27.8 % — ABNORMAL LOW (ref 36.0–46.0)
Hemoglobin: 8.7 g/dL — ABNORMAL LOW (ref 12.0–15.0)
MCH: 24.7 pg — ABNORMAL LOW (ref 26.0–34.0)
MCV: 79 fL (ref 78.0–100.0)
RBC: 3.52 MIL/uL — ABNORMAL LOW (ref 3.87–5.11)

## 2011-05-10 LAB — RENAL FUNCTION PANEL
CO2: 29 mEq/L (ref 19–32)
Calcium: 10.1 mg/dL (ref 8.4–10.5)
Creatinine, Ser: 6.86 mg/dL — ABNORMAL HIGH (ref 0.50–1.10)
Glucose, Bld: 114 mg/dL — ABNORMAL HIGH (ref 70–99)

## 2011-05-10 LAB — VALPROIC ACID LEVEL: Valproic Acid Lvl: 32.3 ug/mL — ABNORMAL LOW (ref 50.0–100.0)

## 2011-05-10 MED ORDER — HYDROCODONE-ACETAMINOPHEN 5-325 MG PO TABS: 1.0000 | ORAL_TABLET | Freq: Four times a day (QID) | ORAL | Status: DC | PRN

## 2011-05-10 NOTE — Patient Care Conference (Addendum)
Inpatient RehabilitationTeam Conference Note Date: 05/09/2011   Time: 3:00 PM    Patient Name: [name]      Medical Record Number: [id number]  Date of Birth: 1945-04-22 Sex: Female         Room/Bed: 4004/4004-01 Payor Info: Payor: MEDICARE  Plan: MEDICARE PART A AND B  Product Type: *No Product type*     Admitting Diagnosis: Seizure, Anoxic Encephalopathy,ESRD  Admit Date/Time:  05/04/2011  3:23 PM Admission Comments: No comment available   Primary Diagnosis:  Seizure Principal Problem: Seizure  Patient Active Problem List  Diagnoses Date Noted  . Epilepsy 05/04/2011  . Physical deconditioning 05/04/2011  . Encephalopathy acute 04/30/2011  . HTN (hypertension) 04/30/2011  . Nicotine abuse 04/30/2011  . Depression 04/30/2011  . GERD (gastroesophageal reflux disease) 04/30/2011  . Cardiac arrest 04/29/2011  . Seizure 04/29/2011  . Pulmonary edema 04/29/2011  . Nonspecific abnormal electrocardiogram (ECG) (EKG) 02/23/2011  . Essential hypertension, malignant 05/11/2009  . Chronic diastolic heart failure 05/11/2009  . LEG PAIN 05/11/2009  . TOBACCO USER 07/27/2008  . CAD 07/27/2008  . CVA 07/27/2008  . PVD 07/27/2008  . ESRD 07/27/2008  . OTHER AND UNSPECIFIED HYPERLIPIDEMIA 07/24/2008    Expected Discharge Date: Expected Discharge Date: 05/16/11  Team Members Present: Physician: Dr. Faith Rogue Case Manager Present: Melanee Spry, RN Social Worker Present: Amada Jupiter, LCSW Nurse Present: Carmie End, RN PT Present: Reggy Eye, PT OT Present: Roney Mans, Felipa Eth, OT SLP Present: Feliberto Gottron, SLP Other (Discipline and Name): Tora Duck, PPS Coordinator     Current Status/Progress Goal Weekly Team Focus  Medical   altered mental status, likely from the valproic acid. had been making progress   improve mental alertness  see above   Bowel/Bladder   Incontient of BM/ continent of urine-Oliguric   min assistance      Swallow/Nutrition/  Hydration   Dys. 2 with thin liquids,  Full supervision with Mod-Max cueing for strategies  supervision with least restrictive diet  utilization of swallowing compensatory strategies.    ADL's   Fluctuates between mod and total  supervision, cueing      Mobility     fluctuating between min and mod   S     Communication   Mod-Max A  TBD  expression of wants/needs   Safety/Cognition/ Behavioral Observations  Max A   Supervision  attention, safety awareness, problem solving   Pain             Skin   non active shingles  no skin breakdown         *See Interdisciplinary Assessment and Plan and progress notes for long and short-term goals  Barriers to Discharge:  safety awareness    Possible Resolutions to Barriers:  supervision at home    Discharge Planning/Teaching Needs:  Home with daughters to provide 24/7 assistance.      Team Discussion: Seizure meds adjusted.  Will see how she progresses.   Revisions to Treatment Plan: none    Continued Need for Acute Rehabilitation Level of Care: The patient requires daily medical management by a physician with specialized training in physical medicine and rehabilitation for the following conditions: Daily direction of a multidisciplinary physical rehabilitation program to ensure safe treatment while eliciting the highest outcome that is of practical value to the patient.: Yes Daily medical management of patient stability for increased activity during participation in an intensive rehabilitation regime.: Yes Daily analysis of laboratory values and/or radiology reports with any subsequent need  for medication adjustment of medical intervention for : Neurological problems;Other  Brock Ra 05/10/2011, 3:21 PM

## 2011-05-10 NOTE — Progress Notes (Signed)
1500 report given to AL, RN. 1559 pt. Transported to Dialysis by AL,RN.  VSS, pt. AOX3.  See [employer]

## 2011-05-10 NOTE — Progress Notes (Signed)
Occupational Therapy Session Note  Patient Details  Name: [name] MRN: [id number] Date of Birth: Feb 07, 1945  Today's Date: 05/10/2011 Time: 2130-8657 Time Calculation (min): 45 min  Skilled Therapeutic Interventions/Progress Updates: Patient seen this am for bathing and dressing.  Thoroughly reviewed chart after yesterday's testing.  MRI without acute findings, patient with encephalomalacia.  Medication changes made recently (Valproic Acid) Patient awake and alert, oriented to place, situation.  Still with disordered thoughts, and function, although much brighter than yesterday.  Speech clear.  Focus today on patient's balance, activity tolerance, and sequencing steps of basic self care tasks.  Therapy Documentation Precautions:  Precautions Precautions: Fall Precaution Comments: siezure precautions Restrictions Weight Bearing Restrictions: No  Pain: Pain Assessment Pain Assessment: No/denies pain Pain Score: 0-No pain Faces Pain Scale: No hurt  See FIM for current functional status  Therapy/Group: Individual Therapy  Collier Salina 05/10/2011, 10:18 AM

## 2011-05-10 NOTE — Progress Notes (Addendum)
Patient ID: [name], female   DOB: 1945/05/05, 66 y.o.   MRN: [id number] Subjective/Complaints: Pt slow to arouse this am, but seems to be more appropriate. RN feels that patient was more appropriate overnight and this AM. Review of Systems  Constitutional: Positive for malaise/fatigue.  All other systems reviewed and are negative.     Objective: Vital Signs: Blood pressure 177/59, pulse 59, temperature 98.4 F (36.9 C), temperature source Oral, resp. rate 19, weight 63.8 kg (140 lb 10.5 oz), SpO2 98.00%. Ct Head Wo Contrast  05/08/2011  *RADIOLOGY REPORT*  Clinical Data: Confusion.  CT HEAD WITHOUT CONTRAST  Technique:  Contiguous axial images were obtained from the base of the skull through the vertex without contrast.  Comparison: CT 04/29/2011, MRI 02/20/2011  Findings: Image quality degraded by motion.  Multiple repeat images were performed due to motion.  Generalized atrophy.  Ventricle size is prominent but stable.  Chronic infarcts in the left frontal parietal lobe are unchanged. Chronic cerebellar infarcts bilaterally are unchanged. Chronic thalamic infarcts bilaterally.  Negative for acute infarct, hemorrhage, or mass lesion.  Calvarium intact.  IMPRESSION: Atrophy and chronic ischemic change.  No acute abnormality.  Original Report Authenticated By: Camelia Phenes, M.D.   Mr Brain Wo Contrast  05/09/2011  *RADIOLOGY REPORT*  Clinical Data: Confusion, ataxia, seizures.  Post cardiac arrest. High blood pressure, renal failure and hyperlipidemia.  MRI HEAD WITHOUT CONTRAST  Technique:  Multiplanar, multiecho pulse sequences of the brain and surrounding structures were obtained according to standard protocol without intravenous contrast.  Comparison: 05/08/2011 CT.  02/20/2011 MR.  Findings: Motion degraded exam.  No acute infarct.  Remote infarcts with encephalomalacia left frontal lobe, left parietal lobe, left occipital lobe and cerebellum bilaterally. Remote small thalamic and basal  ganglia infarcts.  Small vessel disease type changes.  No intracranial hemorrhage.  No intracranial mass lesion detected on this unenhanced exam.  Global atrophy with ventricular prominence felt to be related to atrophy rather hydrocephalus.  Altered signal intensity bone marrow consistent with anemia from the patient's renal failure.  Major intracranial vascular structures appear to be grossly patent. Intracranial atherosclerotic type changes suspected.  IMPRESSION: No acute infarct.  Please see above.  Original Report Authenticated By: Fuller Canada, M.D.    Basename 05/09/11 0610 05/08/11 1923  WBC 4.9 3.7*  HGB 10.0* 9.4*  HCT 30.7* 28.4*  PLT 192 157    Basename 05/09/11 0610 05/08/11 1923  NA 138 133*  K 3.5 4.2  CL 99 95*  CO2 30 23  GLUCOSE 67* 71  BUN 12 47*  CREATININE 3.91* 8.48*  CALCIUM 10.5 10.6*   CBG (last 3)   Basename 05/09/11 2114 05/09/11 1813 05/09/11 1741  GLUCAP 94 106* 65*    Wt Readings from Last 3 Encounters:  05/10/11 63.8 kg (140 lb 10.5 oz)  05/04/11 67.042 kg (147 lb 12.8 oz)  02/27/11 67.5 kg (148 lb 13 oz)    Physical Exam:  General appearance: alert, cooperative and no distress Head: Normocephalic, without obvious abnormality, atraumatic Eyes: EOMI,PERRLA Ears: Ext nl Nose: Nares normal. Septum midline. Mucosa normal. No drainage or sinus tenderness. Throat: lips, mucosa, and tongue normal; teeth and gums normal Neck: no adenopathy, no carotid bruit, no JVD, supple, symmetrical, trachea midline and thyroid not enlarged, symmetric, no tenderness/mass/nodules Back: symmetric, no curvature. ROM normal. No CVA tenderness. Resp: clear to auscultation bilaterally Cardio: regular rate and rhythm, S1, S2 normal, no murmur, click, rub or gallop GI: soft, non-tender; bowel sounds  normal; no masses,  no organomegaly Extremities: extremities normal, atraumatic, no cyanosis or edema Pulses: 2+ and symmetric Skin: Skin color, texture, turgor normal.  No rashes or lesions Neurologic: slow to arouse this am but tells me her name and that she's in [place]. Follows simple one step commands. Kept her eyes closed quite a bit. Moves all 4's.  Incision/Wound:    Assessment/Plan: 1. Functional deficits secondary to seizure, cardiac arrest, anoxic encephalopathy which require 3+ hours per day of interdisciplinary therapy in a comprehensive inpatient rehab setting. Physiatrist is providing close team supervision and 24 hour management of active medical problems listed below. Physiatrist and rehab team continue to assess barriers to discharge/monitor patient progress toward functional and medical goals. FIM: FIM - Bathing Bathing Steps Patient Completed: Chest;Abdomen;Right upper leg;Left upper leg;Right lower leg (including foot);Left lower leg (including foot);Right Arm Bathing: 3: Mod-Patient completes 5-7 90f 10 parts or 50-74%  FIM - Upper Body Dressing/Undressing Upper body dressing/undressing steps patient completed: Thread/unthread right sleeve of pullover shirt/dresss;Thread/unthread left sleeve of pullover shirt/dress Upper body dressing/undressing: 2: Max-Patient completed 25-49% of tasks FIM - Lower Body Dressing/Undressing Lower body dressing/undressing steps patient completed: Pull underwear up/down;Don/Doff right sock;Don/Doff left sock Lower body dressing/undressing: 2: Max-Patient completed 25-49% of tasks  FIM - Toileting Toileting steps completed by patient: Adjust clothing prior to toileting;Performs perineal hygiene;Adjust clothing after toileting Toileting Assistive Devices: Grab bar or rail for support Toileting: 0: Activity did not occur  FIM - Diplomatic Services operational officer Devices: Grab bars Toilet Transfers: 4-To toilet/BSC: Min A (steadying Pt. > 75%);4-From toilet/BSC: Min A (steadying Pt. > 75%)  FIM - Bed/Chair Transfer Bed/Chair Transfer Assistive Devices: Bed rails;HOB elevated Bed/Chair  Transfer: 3: Supine > Sit: Mod A (lifting assist/Pt. 50-74%/lift 2 legs;4: Sit > Supine: Min A (steadying pt. > 75%/lift 1 leg);4: Chair or W/C > Bed: Min A (steadying Pt. > 75%);4: Bed > Chair or W/C: Min A (steadying Pt. > 75%)  FIM - Locomotion: Wheelchair Locomotion: Wheelchair: 0: Activity did not occur FIM - Locomotion: Ambulation Locomotion: Ambulation Assistive Devices: Designer, industrial/product Ambulation/Gait Assistance: 3: Mod assist Locomotion: Ambulation: 1: Travels less than 50 ft with moderate assistance (Pt: 50 - 74%)  Comprehension Comprehension Mode: Auditory Comprehension: 2-Understands basic 25 - 49% of the time/requires cueing 51 - 75% of the time  Expression Expression Mode: Verbal Expression: 2-Expresses basic 25 - 49% of the time/requires cueing 50 - 75% of the time. Uses single words/gestures.  Social Interaction Social Interaction: 1-Interacts appropriately less than 25% of the time. May be withdrawn or combative.  Problem Solving Problem Solving: 1-Solves basic less than 25% of the time - needs direction nearly all the time or does not effectively solve problems and may need a restraint for safety  Memory Memory: 1-Recognizes or recalls less than 25% of the time/requires cueing greater than 75% of the time  1. DVT Prophylaxis/Anticoagulation: Mechanical: Sequential compression devices, below knee Bilateral lower extremities  2. Pain Management: Monitor. Denies pain today 3. Mood: denies any issues with depression or anxiety.  4. Seizures:  depakote changed as below 5. ESRD: continue HD on MWFschedule.  6. Anemia of chronic disease:  7. Hyponatremia: Recheck in HD . Has been ranging from 66 yo 134 range.  8. CAD with NICM: continue : baby ASA with lipitor, coreg, and Norvasc 9.  Systolic HTN- poor control. Adjust meds- she is due for HD today. 10. AMS- work up really unremarkable so far. MRI without acute change.  Labs essentially unremarkable  -VPA changed to  keppra after discussion with neuro yesterday afternoon.  -looks to be improving by all accounts  -continue to monitor closely LOS (Days) 6 A FACE TO FACE EVALUATION WAS PERFORMED  Esbeidy Mclaine T 05/10/2011, 7:14 AM

## 2011-05-10 NOTE — Progress Notes (Signed)
Subjective:  Awake, alert with mental status significantly improved today; standing in hall with PT and yelled out "Benjaman Lobe...what are you doing down here!" generalized weakness but feeling much better today.    Vital signs in last 24 hours: Filed Vitals:   05/09/11 2037 05/09/11 2337 05/10/11 0524 05/10/11 0547  BP:  160/58 170/59 177/59  Pulse:    59  Temp:    98.4 F (36.9 C)  TempSrc:    Oral  Resp:    19  Weight:    63.8 kg (140 lb 10.5 oz)  SpO2: 96%   98%   Weight change: -3.2 kg (-7 lb 0.9 oz)  Intake/Output Summary (Last 24 hours) at 05/10/11 1131 Last data filed at 05/10/11 0800  Gross per 24 hour  Intake    360 ml  Output      0 ml  Net    360 ml   Labs: Basic Metabolic Panel:  Lab 05/09/11 9811 05/08/11 1923 05/05/11 1809  NA 138 133* 131*  K 3.5 4.2 4.3  CL 99 95* 91*  CO2 30 23 26   GLUCOSE 67* 71 96  BUN 12 47* 44*  CREATININE 3.91* 8.48* 8.00*  CALCIUM 10.5 10.6* 10.6*  ALB -- -- --  PHOS -- 5.1* 5.8*   Liver Function Tests:  Lab 05/09/11 0610 05/08/11 1923 05/05/11 1809  AST 16 -- --  ALT 8 -- --  ALKPHOS 80 -- --  BILITOT 0.2* -- --  PROT 5.9* -- --  ALBUMIN 2.8* 2.8* 2.8*   No results found for this basename: LIPASE:3,AMYLASE:3 in the last 168 hours  Lab 05/09/11 0610  AMMONIA 36   CBC:  Lab 05/09/11 0610 05/08/11 1923 05/05/11 1809  WBC 4.9 3.7* 3.8*  NEUTROABS -- -- --  HGB 10.0* 9.4* 9.8*  HCT 30.7* 28.4* 29.8*  MCV 77.7* 76.3* 77.8*  PLT 192 157 163   Cardiac Enzymes: No results found for this basename: CKTOTAL:5,CKMB:5,CKMBINDEX:5,TROPONINI:5 in the last 168 hours CBG:  Lab 05/10/11 0725 05/09/11 2114 05/09/11 1813 05/09/11 1741 05/09/11 1708  GLUCAP 83 94 106* 65* 58*    Iron Studies: No results found for this basename: IRON,TIBC,TRANSFERRIN,FERRITIN in the last 72 hours Studies/Results: Ct Head Wo Contrast  05/08/2011  *RADIOLOGY REPORT*  Clinical Data: Confusion.  CT HEAD WITHOUT CONTRAST  Technique:  Contiguous axial  images were obtained from the base of the skull through the vertex without contrast.  Comparison: CT 04/29/2011, MRI 02/20/2011  Findings: Image quality degraded by motion.  Multiple repeat images were performed due to motion.  Generalized atrophy.  Ventricle size is prominent but stable.  Chronic infarcts in the left frontal parietal lobe are unchanged. Chronic cerebellar infarcts bilaterally are unchanged. Chronic thalamic infarcts bilaterally.  Negative for acute infarct, hemorrhage, or mass lesion.  Calvarium intact.  IMPRESSION: Atrophy and chronic ischemic change.  No acute abnormality.  Original Report Authenticated By: Camelia Phenes, M.D.   Mr Brain Wo Contrast  05/09/2011  *RADIOLOGY REPORT*  Clinical Data: Confusion, ataxia, seizures.  Post cardiac arrest. High blood pressure, renal failure and hyperlipidemia.  MRI HEAD WITHOUT CONTRAST  Technique:  Multiplanar, multiecho pulse sequences of the brain and surrounding structures were obtained according to standard protocol without intravenous contrast.  Comparison: 05/08/2011 CT.  02/20/2011 MR.  Findings: Motion degraded exam.  No acute infarct.  Remote infarcts with encephalomalacia left frontal lobe, left parietal lobe, left occipital lobe and cerebellum bilaterally. Remote small thalamic and basal ganglia infarcts.  Small vessel  disease type changes.  No intracranial hemorrhage.  No intracranial mass lesion detected on this unenhanced exam.  Global atrophy with ventricular prominence felt to be related to atrophy rather hydrocephalus.  Altered signal intensity bone marrow consistent with anemia from the patient's renal failure.  Major intracranial vascular structures appear to be grossly patent. Intracranial atherosclerotic type changes suspected.  IMPRESSION: No acute infarct.  Please see above.  Original Report Authenticated By: Fuller Canada, M.D.   Medications:      . amLODipine  10 mg Oral Daily  . aspirin EC  81 mg Oral Daily  .  atorvastatin  10 mg Oral q1800  . carvedilol  25 mg Oral BID WC  . citalopram  20 mg Oral QHS  . diphenhydrAMINE  25 mg Oral Q M,W,F-HD  . feeding supplement (NEPRO CARB STEADY)  237 mL Oral TID WC  . Fluticasone-Salmeterol  1 puff Inhalation Q12H  . hydrALAZINE  25 mg Oral Q6H  . levETIRAcetam  500 mg Oral Q2000  . levothyroxine  88 mcg Oral Daily  . LORazepam      . multivitamin  1 tablet Oral QHS  . pantoprazole  40 mg Oral Q1200  . sevelamer  2,400 mg Oral TID WC  . thiamine (B-1) 500 mg  500 mg Intravenous Once  . thiamine  100 mg Intravenous Daily  . thiamine  200 mg Intravenous Daily  . thiamine  100 mg Oral Daily  . DISCONTD: darbepoetin (ARANESP) injection - DIALYSIS  6.5476 mcg Intravenous Q Wed-HD  . DISCONTD: divalproex  500 mg Oral QHS  . DISCONTD: feeding supplement  1 Container Oral TID BM  . DISCONTD: paricalcitol  7 mcg Intravenous Q M,W,F-HD    I  have reviewed scheduled and prn medications.  Physical Exam  General: comfortable, NAD  Heart: RRR, 2-3/6 SEM  Lungs: diminished bases only; no rhonchi, rales, or wheezes Abdomen: soft NT, +BS  Extremities: no LE edema  Dialysis Access: left upper HERO + bruit  Neuro: awake, alert, Oriented  X 2 at present  Assessment/Plan:  1.Seizure with CPR/anoxic encephalopathy- AMS without seizure activity Monday; negative EEG and MRI yesterday without acute findings; has ecephalomalacia; neuro following; only change was a reduction in dose of depakote...  2.ESRD - HD on MWF @ [facility]; HD today with labs pre-tx  3. Hypokalemia- K 3.5, replete with HD; follow  4. Hypertension/volume - improved with meds (currently on hydralazine, amlodipine (change to HS) and carvedilol); max UF with HD; follow.  5. Anemia - hemoglobin ^ 10.0; added Aranesp 6.25 mcg qwk; follow trend  6.Metabolic bone disease - phos 5.1, ca 10.5 (corrected ca+ 11.46); on Renvela, Sensipar and Zemplar ( ); d/c'd Zemplar, cont Sensipar, and use 2.25 Ca bath    7.Malnutrition - Alb 2.8, on dysphagia 2 with renal restrictions; change Resource to Nepro scheduled TIDWC; follow  8. HLD- on Lipitor  9. Depression - on Celexa.  10. Hypothyrodism- on synthroid with TSH level euthyroid (2.043) 11. Disposition- per primary services   Samuel Germany, FNP-C [facility] Pager [phone]  05/10/2011,11:31 AM  LOS: 6 days  I concur with note of Vilinda Blanks with highlighted additions.  Patient seems much more alert and appropriate today (although emotionally labile).  Sitting up in the chair reading the newspaper.  For HD later today after therapies.Katrina Daddona B

## 2011-05-10 NOTE — Progress Notes (Signed)
Pt. Requests to sign off hemodialysis treatment 38 minutes early. RN encouraged patient to complete treatment, patient insists to stop treatment due to nausea, refused prn med stating " I just want to be taken off this maching". RN informed patient the renal md must be notified and the AMA form must be signed. Pt. Expressed understanding of the process stating "I do this all the time at the outpatient center". Dr. Caryn Section notified inquired about uf goal, RN reported goal of 2500, removed approximately 1900. Dr. Caryn Section states "ok". Hemodialysis treatment stopped, blood rinsed back, AMA form complete.

## 2011-05-10 NOTE — Progress Notes (Signed)
Physical Therapy Session Note  Patient Details  Name: [name] MRN: [id number] Date of Birth: 05-05-1945  Today's Date: 05/10/2011 Time: 9562-1308 Time Calculation (min): 45 min S:  Pt talkative, seems more at baseline.  Pt requested to return to room early due to fatigue, wanting to get into bed between therapies.  Skilled Therapeutic Interventions/Progress Updates:     See below.  Therapy Documentation Precautions:  Precautions Precautions: Fall Precaution Comments: siezure precautions Restrictions Weight Bearing Restrictions: No Pain: Pain Assessment Pain Assessment: No/denies pain Pain Score: 0-No pain Faces Pain Scale: No hurt Pt started complaining of pain in left hand during session, rated 7/10, RN gave tylenol. Mobility:  Transfer OOB with min@ and  Back into. Locomotion :  Gait training on unit with HHA x 50' with min@ x 2.  5 steps with 2 rails min@ Balance:   Dynamic standing balance to fold towels x 5 min. With one loss of balance posteriorly, requiring assist to regain.  Exercises: Nustep on 50 cm2 for 3 min x 2, for strengthening.    See FIM for current functional status  Therapy/Group: Individual Therapy  [name] 05/10/2011, 9:28 AM

## 2011-05-10 NOTE — Progress Notes (Signed)
Speech Language Pathology Daily Session Note  Patient Details  Name: [name] MRN: [id number] Date of Birth: 1945/06/05  Today's Date: 05/10/2011 Time: 1400-1430 Time Calculation (min): 30 min  Short Term Goals:  SLP Short Term Goal 1 (Week 1): Pt will utilize external memory aids to increase recall and carryover of newly learned information with supervision semantic cues.  SLP Short Term Goal 2 (Week 1): Pt will demonstrate functional problem solving for basic and familair tasks with supervision verbal cues. SLP Short Term Goal 3 (Week 1): Pt will demonstrate emergent awareness of deficits during functional and familiar tasks with Min A verbal and semantic cues.  SLP Short Term Goal 4 (Week 1): Pt will demonstrate efficient mastication of Dys. 2 textures with Mod A verbal cues SLP Short Term Goal 5 (Week 1): Pt will utilize swallowing compensatory strategies with Mod A verbal and semantic cues.   Skilled Therapeutic Interventions: Pt with improved overall cognitive function today. Pt's swallowing function re-evaluated. Pt demonstrated efficient mastication of regular textures and consumed thin liquids via cup without overt s/s of aspiration. Pt's diet upgraded but will require full supervision due to impulsivity with rate and bite size.   Daily Session FIM:  Comprehension Comprehension Mode: Auditory Comprehension: 5-Understands basic 90% of the time/requires cueing < 10% of the time Expression Expression: 4-Expresses basic 75 - 89% of the time/requires cueing 10 - 24% of the time. Needs helper to occlude trach/needs to repeat words. Social Interaction Social Interaction: 5-Interacts appropriately 90% of the time - Needs monitoring or encouragement for participation or interaction. Problem Solving Problem Solving: 4-Solves basic 75 - 89% of the time/requires cueing 10 - 24% of the time Memory Memory: 3-Recognizes or recalls 50 - 74% of the time/requires cueing 25 - 49% of the  time FIM - Eating Eating Activity: 5: Supervision/cues Pain Pain Assessment Pain Assessment: No/denies pain Pain Score: 0-No pain  Therapy/Group: Individual Therapy  Kimerly Rowand 05/10/2011, 5:02 PM

## 2011-05-10 NOTE — Procedures (Signed)
I have personally attended this patient's dialysis session.  No changes made to the HD prescription.  BFR 400.  BP 157/64.    [name]

## 2011-05-10 NOTE — Progress Notes (Signed)
Physical Therapy Session Note  Patient Details  Name: [name] MRN: [id number] Date of Birth: Sep 17, 1945  Today's Date: 05/10/2011 Time: 1102-1200 Time Calculation (min): 58 min  Short Term Goals: Week 1:  PT Short Term Goal 1 (Week 1): STG = LTG  Skilled Therapeutic Interventions/Progress Updates:  Pt much more alert and oriented today, but continues to have some difficulty with memory and problem solving. Tx focused on gait training and standing balance. No device was used this tx to continue challenging balance, but pt will likely benefit from using a device for gait until yesterday's chance in status fully resolves.  Used music this tx for encouragement, which seemed to improve effort.      Therapy Documentation Precautions:  Precautions Precautions: Fall Precaution Comments: siezure precautions Restrictions Weight Bearing Restrictions: No General: Amount of Missed PT Time (min): 15 Minutes Missed Time Reason: Patient fatigue Vital Signs: Oxygen Therapy O2 Device: None (Room air) Pain: no c/o pain.   Mobility: Supine>sit with S Sit<>stand from various surfaces x10 with , pt needing cues to control descent as she tends to just fall back with little regard for safety.    Locomotion : Ambulation Ambulation/Gait Assistance: 4: Min assist (Hand hold assist with no device) 1x150', 4x50'. Pt needed cues to focus and control balance during gait, with improvement in quality as pt otherwise generally unsteady, especially posteriorly with decreased righting reactions.     Balance: Performed static and dynamic balance activities for 30 sec of each of the following conditions:  Static balance: Regular stance, tandem stance, parallel stance, SLS. Pt has some difficulty following instructions for LE placement with these tasks, needing mostly Min-mod A, but Max A for SLS, unable to hold for 5 sec.  Dynamic balance: lateral walking 4x20', retro walking 4x20' with Mod A for steadying  due to decreased foot clearance and posterior LOB, step taps to 4" step x20bil, step over cane forward and backward x10 each direction with Mod A, throwing ball to self against wall, and dancing.    Exercises: Kinetron x35min seated at 70cm for LE strengthening       See FIM for current functional status  Therapy/Group: Individual Therapy  Virl Cagey 05/10/2011, 12:23 PM

## 2011-05-11 LAB — GLUCOSE, CAPILLARY
Glucose-Capillary: 96 mg/dL (ref 70–99)
Glucose-Capillary: 95 mg/dL (ref 70–99)

## 2011-05-11 MED ORDER — NICOTINE 14 MG/24HR TD PT24
14.0000 mg | MEDICATED_PATCH | Freq: Every day | TRANSDERMAL | Status: DC
Start: 2011-05-11 — End: 2011-05-16
  Administered 2011-05-11 – 2011-05-16 (×6): 14 mg via TRANSDERMAL
  Filled 2011-05-11 (×9): qty 1

## 2011-05-11 MED ORDER — CALCIUM CARBONATE 1250 MG/5ML PO SUSP
500.0000 mg | Freq: Four times a day (QID) | ORAL | Status: DC | PRN
Start: 2011-05-11 — End: 2011-05-16
  Administered 2011-05-12 – 2011-05-15 (×3): 500 mg via ORAL
  Filled 2011-05-11 (×3): qty 5

## 2011-05-11 NOTE — Progress Notes (Signed)
Physical Therapy Note  Patient Details  Name: Jared Cahn MRN: [id number] Date of Birth: 11-15-1945 Today's Date: 05/11/2011  15:40-16:25 individual therapy pt denied pain.  Session focused on gait training outside on uneven ground with cane verses rollator walker trial. Pt with decreased endurance and 2 stumbles self corrected by pt with cane and required 5 standing rest breaks. With rollator walker pt demonstrated improved safety with curb with vc for brake use and improved cadence. Pt became dizzy and needed to sit on walker 30' from chair. Pt was questioned with options when presented with steps for problem solving and safety awareness with walker verses cane and pt answered appropriately that she would not attempt 4 steps with walker. Pt's LEs were weak on the steps with 1 rail and cane. Overall min assist.    Julian Reil 05/11/2011, 4:26 PM

## 2011-05-11 NOTE — Progress Notes (Signed)
Physical Therapy Weekly Progress Note  Patient Details  Name: [name] MRN: [id number] Date of Birth: 1945/05/16  Today's Date: 05/11/2011  Patient has met 1 of 9 long term goals.  Short term goals not set due to estimated length of stay. LOS was extended however d/t pt becoming increasingly confused, less responsive and more off balance. Medical workup was completed and testing negative for NEW stroke and symptoms likely related to siezure meds which have been changed.  Based on missed therapy and ongoing safety concerns, goals downgraded to S for all mobility and daughters are available to provide this.  Patient continues to demonstrate the following deficits:decreased postural control, poor balance strategies in standing, decreased memory, decreased safety awareness and therefore will continue to benefit from skilled PT intervention to enhance overall performance with balance, postural control, ability to compensate for deficits and awareness, minimize fall risk and maximize functional I.  See Patient's Care Plan for progression toward long term goals.  Patient not progressing toward long term goals.  See goal revision..  Plan of care revisions: S. Pt educated she may benefit from use of assistive device with gait at d/c but has not fully "bought in to this".      Rogelia Mire 05/11/2011, 9:32 AM

## 2011-05-11 NOTE — Progress Notes (Signed)
I have examined pt and agree with note and plans as outlined by Vilinda Blanks.

## 2011-05-11 NOTE — Care Management Note (Signed)
Per State Regulation 482.30 This chart was reviewed for medical necessity with respect to the patient's Admission/Duration of stay. Since adjustment of seizure medication, pt is participating txs--currently at least min assist w/ balance issues, poor safety awareness/insight w/ supervision goals. Will need family ed w/ daughters before pt's d/c.    Brock Ra                 Nurse Care Manager              Next Review Date: 05/15/11

## 2011-05-11 NOTE — Progress Notes (Signed)
Physical Therapy Session Note  Patient Details  Name: [name] MRN: [id number] Date of Birth: Jun 10, 1945  Today's Date: 05/11/2011 Time: 0805-0900 Time Calculation (min): 55 min  Short Term Goals: Week 1:  PT Short Term Goal 1 (Week 1): STG = LTG  Skilled Therapeutic Interventions/Progress Updates:    Pt chart reviewed and "episode" thought to be med related, no new stroke. Pt alert today and following commands, basically presenting as she did on eval with this therapist.  Balance continues to be impaired and pt educated she will benefit from use of device to decrease fall rsk. Education and gait training with straight cane in R hand (pt has used before but pattern not smooth 2 point or three point) and 4 wheeled walker (neighbor gave her this but she had not used). Gait unsteady with wide BOS, staggering at times, variable cadence, increased unsteadiness with turns and head turns and cognitive task.  Goals revised to S SW does report pt has S.  Therapeutic activiity- car transfer S, floor transfer  Therapy Documentation Precautions:  Precautions Precautions: Fall Precaution Comments: siezure Restrictions Weight Bearing Restrictions: No    Vital Signs: TPulse Rate: 70  Pain: Pain Assessment Pain Assessment:  (R chest wall discomfort from CPR, no intervention required) Mobility: Transfers Sit to Stand: 5: Supervision Stand to Sit: 5: Supervision Locomotion : Ambulation Ambulation/Gait Assistance: 4: Min guard      Balance:LOB posteriorly and fell back onto bed when donning pants      Other Treatments: Treatments Therapeutic Activity: floor transfer S for fall recovery  See FIM for current functional status  Therapy/Group: Individual Therapy  Michaelene Song 05/11/2011, 9:16 AM

## 2011-05-11 NOTE — Progress Notes (Signed)
Occupational Therapy Session Note  Patient Details  Name: [name] MRN: [id number] Date of Birth: Aug 01, 1945  Today's Date: 05/11/2011 Time: 0900-1000 Time Calculation (min): 60 min  Short Term Goals: =LTG due to short estimated LOS  Skilled Therapeutic Interventions:  Self care retraining to include gather clothes, shower, dress, and laundry.  Focus session on remembering to use her cane and not leave it behind, use her cane safely during functional mobility and BADL & IADL tasks, dynamic balance, activity tolerance, easily distracted, scattered thoughts and loss of focus yet able to regroup with minimal cueing.  Precautions:  Precautions: Fall Precaution Comments: siezure Weight Bearing Restrictions: No  Pain: Pain Assessment Pain Assessment: No/denies pain  See FIM for current functional status  Therapy/Group: Individual Therapy  [name] 05/11/2011, 2:40 PM

## 2011-05-11 NOTE — Progress Notes (Signed)
Speech Language Pathology Daily Session Note  Patient Details  Name: [name] MRN: [id number] Date of Birth: 1945/02/14  Today's Date: 05/11/2011 Time: 1200-1245 Time Calculation (min): 45 min  Short Term Goals:  SLP Short Term Goal 1 (Week 1): Pt will utilize external memory aids to increase recall and carryover of newly learned information with supervision semantic cues.  SLP Short Term Goal 2 (Week 1): Pt will demonstrate functional problem solving for basic and familair tasks with supervision verbal cues. SLP Short Term Goal 3 (Week 1): Pt will demonstrate emergent awareness of deficits during functional and familiar tasks with Min A verbal and semantic cues.  SLP Short Term Goal 4 (Week 1): Pt will demonstrate efficient mastication of Dys. 2 textures with Mod A verbal cues SLP Short Term Goal 5 (Week 1): Pt will utilize swallowing compensatory strategies with Mod A verbal and semantic cues.   Skilled Therapeutic Interventions: Treatment focus on utilization of swallowing compensatory strategies with regular textures and thin liquids. Pt required intermittent supervision to utilize small bites. Pt with throat clear X 2, suspect due to large bites. Pt changed to intermittent supervision with meals. Pt demonstrated alternated attention between meal and conversation with Mod I.   Daily Session FIM:  Comprehension Comprehension Mode: Auditory Comprehension: 5-Understands complex 90% of the time/Cues < 10% of the time Expression Expression: 6-Expresses complex ideas: With extra time/assistive device Social Interaction Social Interaction: 5-Interacts appropriately 90% of the time - Needs monitoring or encouragement for participation or interaction. Problem Solving Problem Solving: 5-Solves basic 90% of the time/requires cueing < 10% of the time Memory Memory: 4-Recognizes or recalls 75 - 89% of the time/requires cueing 10 - 24% of the time Pain Pain Assessment Pain Assessment:  No/denies pain   Therapy/Group: Individual Therapy  Markees Carns 05/11/2011, 1:38 PM

## 2011-05-11 NOTE — Progress Notes (Signed)
Subjective:  Lying in bed; reports sleepy but awakened fully alert and oriented (back to her baseline); she was able to tell me her discharge plans (possibly discharge home on 05/16/11 to live with her daughter); she also told me that her mother was being transferred to ha SNF this wknd off [place]!  I told her [facility] PA this wknd and she states, "I really like Onalee Hua..he's so nice"  Vital signs in last 24 hours: Filed Vitals:   05/10/11 2047 05/10/11 2130 05/11/11 0555 05/11/11 0913  BP: 138/47  175/66   Pulse: 61  72 70  Temp: 98.7 F (37.1 C)  98.4 F (36.9 C)   TempSrc: Oral  Oral   Resp: 19  16   Weight:   60.3 kg (132 lb 15 oz)   SpO2: 98% 97% 98%    Weight change: 3.2 kg (7 lb 0.9 oz)  Intake/Output Summary (Last 24 hours) at 05/11/11 1137 Last data filed at 05/11/11 0800  Gross per 24 hour  Intake   1320 ml  Output   2118 ml  Net   -798 ml   Labs: Basic Metabolic Panel:  Lab 05/10/11 1610 05/09/11 0610 05/08/11 1923 05/05/11 1809  NA 134* 138 133* --  K 3.6 3.5 4.2 --  CL 93* 99 95* --  CO2 29 30 23  --  GLUCOSE 114* 67* 71 --  BUN 37* 12 47* --  CREATININE 6.86* 3.91* 8.48* --  CALCIUM 10.1 10.5 10.6* --  ALB -- -- -- --  PHOS 4.7* -- 5.1* 5.8*   Liver Function Tests:  Lab 05/10/11 1624 05/09/11 0610 05/08/11 1923  AST -- 16 --  ALT -- 8 --  ALKPHOS -- 80 --  BILITOT -- 0.2* --  PROT -- 5.9* --  ALBUMIN 2.6* 2.8* 2.8*   No results found for this basename: LIPASE:3,AMYLASE:3 in the last 168 hours  Lab 05/09/11 0610  AMMONIA 36   CBC:  Lab 05/10/11 1623 05/09/11 0610 05/08/11 1923 05/05/11 1809  WBC 3.7* 4.9 3.7* --  NEUTROABS -- -- -- --  HGB 8.7* 10.0* 9.4* --  HCT 27.8* 30.7* 28.4* --  MCV 79.0 77.7* 76.3* 77.8*  PLT 191 192 157 --   Cardiac Enzymes: No results found for this basename: CKTOTAL:5,CKMB:5,CKMBINDEX:5,TROPONINI:5 in the last 168 hours CBG:  Lab 05/11/11 1122 05/11/11 0715 05/10/11 1155 05/10/11 0725 05/09/11 2114    GLUCAP 96 86 92 83 94    Iron Studies: No results found for this basename: IRON,TIBC,TRANSFERRIN,FERRITIN in the last 72 hours Studies/Results: Mr Brain Wo Contrast  05/09/2011  *RADIOLOGY REPORT*  Clinical Data: Confusion, ataxia, seizures.  Post cardiac arrest. High blood pressure, renal failure and hyperlipidemia.  MRI HEAD WITHOUT CONTRAST  Technique:  Multiplanar, multiecho pulse sequences of the brain and surrounding structures were obtained according to standard protocol without intravenous contrast.  Comparison: 05/08/2011 CT.  02/20/2011 MR.  Findings: Motion degraded exam.  No acute infarct.  Remote infarcts with encephalomalacia left frontal lobe, left parietal lobe, left occipital lobe and cerebellum bilaterally. Remote small thalamic and basal ganglia infarcts.  Small vessel disease type changes.  No intracranial hemorrhage.  No intracranial mass lesion detected on this unenhanced exam.  Global atrophy with ventricular prominence felt to be related to atrophy rather hydrocephalus.  Altered signal intensity bone marrow consistent with anemia from the patient's renal failure.  Major intracranial vascular structures appear to be grossly patent. Intracranial atherosclerotic type changes suspected.  IMPRESSION: No acute infarct.  Please  see above.  Original Report Authenticated By: Fuller Canada, M.D.   Medications:      . amLODipine  10 mg Oral Daily  . aspirin EC  81 mg Oral Daily  . atorvastatin  10 mg Oral q1800  . carvedilol  25 mg Oral BID WC  . citalopram  20 mg Oral QHS  . diphenhydrAMINE  25 mg Oral Q M,W,F-HD  . feeding supplement (NEPRO CARB STEADY)  237 mL Oral TID WC  . Fluticasone-Salmeterol  1 puff Inhalation Q12H  . hydrALAZINE  25 mg Oral Q6H  . levETIRAcetam  500 mg Oral Q2000  . levothyroxine  88 mcg Oral Daily  . multivitamin  1 tablet Oral QHS  . pantoprazole  40 mg Oral Q1200  . sevelamer  2,400 mg Oral TID WC  . thiamine  100 mg Intravenous Daily  .  thiamine  200 mg Intravenous Daily  . thiamine  100 mg Oral Daily    I  have reviewed scheduled and prn medications.  Physical Exam  General: comfortable, NAD  Heart: RRR, 2-3/6 SEM  Lungs: diminished bases only; no rhonchi, rales, or wheezes  Abdomen: soft NT, +BS  Extremities: no LE edema  Dialysis Access: left upper HERO + bruit  Neuro: awake, alert, Oriented X 3   Assessment/Plan:  1.Seizure with CPR/anoxic encephalopathy- has ecephalomalacia; neuro following; AMS resolved at this time; as noted above, she's now back to her baseline; etiology unclear; possibly secondary to depakote as improved with dose and level decrease  2.ESRD - HD on MWF @ [facility]; HD tomorrow with labs pre-tx  3. Hypokalemia- K 3.6, cont replete with HD; follow  4. Hypertension/volume - improved with meds (currently on hydralazine, amlodipine (change to HS) and carvedilol); max UF with HD; follow.  5. Anemia - hemoglobin down 8.7, increase Aranesp and give extra 6.25 this wk; follow trend  6.Metabolic bone disease - phos 4.7, ca down 10.1 (corrected ca+ down 11.06); now on Renvela, and Sensipar; Zemplar d/c'd; cont using 2.25 Ca bath (since needs 4K) 7.Malnutrition - Alb 2.8, on dysphagia 2 with renal restrictions; Nepro scheduled TIDWC; follow  8. HLD- on Lipitor  9. Depression - on Celexa.  10. Hypothyrodism- on synthroid with TSH level euthyroid (2.043)  11. Disposition- per primary services; currently anticipated 05/16/11   Samuel Germany, FNP-C [facility] Pager [phone]  05/11/2011,11:37 AM  LOS: 7 days

## 2011-05-11 NOTE — Progress Notes (Signed)
Patient ID: [name], female   DOB: 1945/09/14, 66 y.o.   MRN: [id number] Patient ID: [name], female   DOB: 05-Dec-1945, 66 y.o.   MRN: [id number] Subjective/Complaints: Very alert today. "i am ready to go home". Review of Systems  Constitutional: Positive for malaise/fatigue.  All other systems reviewed and are negative.     Objective: Vital Signs: Blood pressure 175/66, pulse 72, temperature 98.4 F (36.9 C), temperature source Oral, resp. rate 16, weight 60.3 kg (132 lb 15 oz), SpO2 98.00%. Mr Brain Wo Contrast  05/09/2011  *RADIOLOGY REPORT*  Clinical Data: Confusion, ataxia, seizures.  Post cardiac arrest. High blood pressure, renal failure and hyperlipidemia.  MRI HEAD WITHOUT CONTRAST  Technique:  Multiplanar, multiecho pulse sequences of the brain and surrounding structures were obtained according to standard protocol without intravenous contrast.  Comparison: 05/08/2011 CT.  02/20/2011 MR.  Findings: Motion degraded exam.  No acute infarct.  Remote infarcts with encephalomalacia left frontal lobe, left parietal lobe, left occipital lobe and cerebellum bilaterally. Remote small thalamic and basal ganglia infarcts.  Small vessel disease type changes.  No intracranial hemorrhage.  No intracranial mass lesion detected on this unenhanced exam.  Global atrophy with ventricular prominence felt to be related to atrophy rather hydrocephalus.  Altered signal intensity bone marrow consistent with anemia from the patient's renal failure.  Major intracranial vascular structures appear to be grossly patent. Intracranial atherosclerotic type changes suspected.  IMPRESSION: No acute infarct.  Please see above.  Original Report Authenticated By: Fuller Canada, M.D.    Basename 05/10/11 1623 05/09/11 0610  WBC 3.7* 4.9  HGB 8.7* 10.0*  HCT 27.8* 30.7*  PLT 191 192    Basename 05/10/11 1624 05/09/11 0610  NA 134* 138  K 3.6 3.5  CL 93* 99  CO2 29 30  GLUCOSE 114* 67*  BUN 37* 12    CREATININE 6.86* 3.91*  CALCIUM 10.1 10.5   CBG (last 3)   Basename 05/11/11 0715 05/10/11 1155 05/10/11 0725  GLUCAP 86 92 83    Wt Readings from Last 3 Encounters:  05/11/11 60.3 kg (132 lb 15 oz)  05/04/11 67.042 kg (147 lb 12.8 oz)  02/27/11 67.5 kg (148 lb 13 oz)    Physical Exam:  General appearance: alert, cooperative and no distress Head: Normocephalic, without obvious abnormality, atraumatic Eyes: EOMI,PERRLA Ears: Ext nl Nose: Nares normal. Septum midline. Mucosa normal. No drainage or sinus tenderness. Throat: lips, mucosa, and tongue normal; teeth and gums normal Neck: no adenopathy, no carotid bruit, no JVD, supple, symmetrical, trachea midline and thyroid not enlarged, symmetric, no tenderness/mass/nodules Back: symmetric, no curvature. ROM normal. No CVA tenderness. Resp: clear to auscultation bilaterally Cardio: regular rate and rhythm, S1, S2 normal, no murmur, click, rub or gallop GI: soft, non-tender; bowel sounds normal; no masses,  no organomegaly Extremities: extremities normal, atraumatic, no cyanosis or edema Pulses: 2+ and symmetric Skin: Skin color, texture, turgor normal. No rashes or lesions Neurologic: alert and oriented name, place, reason. Follows simple one step commands.sitting with good balance at the EOB. Incision/Wound:    Assessment/Plan: 1. Functional deficits secondary to seizure, cardiac arrest, anoxic encephalopathy which require 3+ hours per day of interdisciplinary therapy in a comprehensive inpatient rehab setting. Physiatrist is providing close team supervision and 24 hour management of active medical problems listed below. Physiatrist and rehab team continue to assess barriers to discharge/monitor patient progress toward functional and medical goals.  Discuss with team regarding dc date as patient is clearing cognitively  FIM: FIM - Bathing Bathing Steps Patient Completed: Chest;Right Arm;Left Arm;Abdomen;Front perineal  area;Buttocks;Right upper leg;Left upper leg Bathing: 4: Min-Patient completes 8-9 16f 10 parts or 75+ percent  FIM - Upper Body Dressing/Undressing Upper body dressing/undressing steps patient completed: Thread/unthread right bra strap;Thread/unthread left bra strap;Hook/unhook bra;Thread/unthread right sleeve of pullover shirt/dresss;Thread/unthread left sleeve of pullover shirt/dress;Pull shirt over trunk;Put head through opening of pull over shirt/dress Upper body dressing/undressing: 5: Set-up assist to: Obtain clothing/put away FIM - Lower Body Dressing/Undressing Lower body dressing/undressing steps patient completed: Thread/unthread right underwear leg;Pull underwear up/down;Thread/unthread left underwear leg;Thread/unthread left pants leg;Don/Doff left sock;Don/Doff right sock;Fasten/unfasten pants;Thread/unthread right pants leg;Pull pants up/down Lower body dressing/undressing: 4: Steadying Assist  FIM - Toileting Toileting steps completed by patient: Adjust clothing prior to toileting;Performs perineal hygiene;Adjust clothing after toileting Toileting Assistive Devices: Grab bar or rail for support Toileting: 5: Supervision: Safety issues/verbal cues  FIM - Diplomatic Services operational officer Devices: Grab bars Toilet Transfers: 4-From toilet/BSC: Min A (steadying Pt. > 75%);4-To toilet/BSC: Min A (steadying Pt. > 75%)  FIM - Bed/Chair Transfer Bed/Chair Transfer Assistive Devices: Bed rails;Arm rests Bed/Chair Transfer: 5: Supine > Sit: Supervision (verbal cues/safety issues);5: Bed > Chair or W/C: Supervision (verbal cues/safety issues);4: Chair or W/C > Bed: Min A (steadying Pt. > 75%)  FIM - Locomotion: Wheelchair Locomotion: Wheelchair: 0: Activity did not occur FIM - Locomotion: Ambulation Locomotion: Ambulation Assistive Devices: Designer, industrial/product Ambulation/Gait Assistance: 4: Min assist (Hand hold assist with no device) Locomotion: Ambulation: 4: Travels 150 ft  or more with minimal assistance (Pt.>75%)  Comprehension Comprehension Mode: Auditory Comprehension: 5-Understands basic 90% of the time/requires cueing < 10% of the time  Expression Expression Mode: Verbal Expression: 4-Expresses basic 75 - 89% of the time/requires cueing 10 - 24% of the time. Needs helper to occlude trach/needs to repeat words.  Social Interaction Social Interaction: 5-Interacts appropriately 90% of the time - Needs monitoring or encouragement for participation or interaction.  Problem Solving Problem Solving: 5-Solves basic 90% of the time/requires cueing < 10% of the time  Memory Memory: 3-Recognizes or recalls 50 - 74% of the time/requires cueing 25 - 49% of the time  1. DVT Prophylaxis/Anticoagulation: Mechanical: Sequential compression devices, below knee Bilateral lower extremities  2. Pain Management: Monitor. Denies pain today 3. Mood: denies any issues with depression or anxiety.  4. Seizures:  KEPPRA STARTED ON Tuesday.   5. ESRD: continue HD on MWFschedule.  6. Anemia of chronic disease:  7. Hyponatremia: Recheck in HD . Has been ranging from 66 yo 134 range.  8. CAD with NICM: continue : baby ASA with lipitor, coreg, and Norvasc 9.  Systolic HTN- poor control at times. 10. AMS- work up really unremarkable so far. MRI without acute change. Labs essentially unremarkable  -VPA changed to keppra and patient has improved dramatically.  Still a little limited insight and awareness  LOS (Days) 7 A FACE TO FACE EVALUATION WAS PERFORMED  Makenzie Weisner T 05/11/2011, 7:46 AM

## 2011-05-12 ENCOUNTER — Inpatient Hospital Stay (HOSPITAL_COMMUNITY): Payer: Medicare Other

## 2011-05-12 DIAGNOSIS — Z5189 Encounter for other specified aftercare: Secondary | ICD-10-CM

## 2011-05-12 DIAGNOSIS — G931 Anoxic brain damage, not elsewhere classified: Secondary | ICD-10-CM

## 2011-05-12 LAB — GLUCOSE, CAPILLARY: Glucose-Capillary: 87 mg/dL (ref 70–99)

## 2011-05-12 LAB — RENAL FUNCTION PANEL
BUN: 44 mg/dL — ABNORMAL HIGH (ref 6–23)
Calcium: 10.4 mg/dL (ref 8.4–10.5)
Creatinine, Ser: 6.71 mg/dL — ABNORMAL HIGH (ref 0.50–1.10)
Glucose, Bld: 85 mg/dL (ref 70–99)
Phosphorus: 3.3 mg/dL (ref 2.3–4.6)

## 2011-05-12 LAB — CBC
HCT: 24.8 % — ABNORMAL LOW (ref 36.0–46.0)
Hemoglobin: 7.9 g/dL — ABNORMAL LOW (ref 12.0–15.0)
MCH: 25.2 pg — ABNORMAL LOW (ref 26.0–34.0)
MCHC: 31.9 g/dL (ref 30.0–36.0)
MCV: 79.2 fL (ref 78.0–100.0)

## 2011-05-12 MED ORDER — DIPHENHYDRAMINE HCL 25 MG PO CAPS
ORAL_CAPSULE | ORAL | Status: AC
  Administered 2011-05-12: 25 mg via ORAL
  Filled 2011-05-12: qty 1

## 2011-05-12 NOTE — Progress Notes (Signed)
Occupational Therapy Session Note  Patient Details  Name: [name] MRN: [id number] Date of Birth: 01/24/1945  Today's Date: 05/12/2011 Time: 1030-1125 Time Calculation (min): 55 min  Short Term Goals: Week 1:     Skilled Therapeutic Interventions/Progress Updates:    Performed bathing and dressing at shower level.  Need minimal cues for organizing and memory.  She forgot where she laid things and if she had all her supplies.  Pt ambulated from bed to bathroom and sat on shower seat with supervision level.  She washed all body and ambulated back to bed to dress.  Demonstrated good physical capabilities with reaching and balance.  Oxygen saturation was 90% during session.    Therapy Documentation Precautions:  Precautions Precautions: Fall Precaution Comments: siezure Restrictions Weight Bearing Restrictions: No    Pain:  none         See FIM for current functional status  Therapy/Group: Individual Therapy  Humberto Seals 05/12/2011, 11:30 AM

## 2011-05-12 NOTE — Progress Notes (Signed)
Physical Therapy Session Note  Patient Details  Name: [name] MRN: [id number] Date of Birth: [birth year]  Today's Date: 05/12/2011 Time: 4098-1191 Time Calculation (min): 25 min  Short Term Goals: Week 1:  PT Short Term Goal 1 (Week 1): STG = LTG  Skilled Therapeutic Interventions/Progress Updates:    Gait training with rollator- pt gait from 4000 to outside ground floor and back (1000 ft approx) with only 1 seated rest break when walking up hill outside. Pt c/o shortness of breath but not noticeable to PT and HR 70.  Pt S for safety and cognition, for example she asked if she should walk across rocks instead of taking paved cement walkway.  Picked object up off floor without LOB with one hand on walker.  Pt I recalled she wanted to change clothes from washer to dryer. Therapy Documentation Precautions:  Precautions Precautions: Fall Precaution Comments: siezure Restrictions Weight Bearing Restrictions: No See FIM for current functional status  Therapy/Group: Individual Therapy  Michaelene Song 05/12/2011, 1:24 PM

## 2011-05-12 NOTE — Progress Notes (Addendum)
Physical Therapy Session Note  Patient Details  Name: [name] MRN: [id number] Date of Birth: 1945/07/24  Today's Date: 05/12/2011 Time: 0921-1014 Time Calculation (min): 53 min  Short Term Goals: Week 2:   STGs=LTGs  Skilled Therapeutic Interventions/Progress Updates:    Session focused on gait training with single point cane, safety with cane on the stairs and static and dynamic standing balance exercises.  Supine to sit supervision for safety HOB flat with rail use, sit to stand supervision for safety due to balance deficits.  Amublation > 300' (150' with rollator supervision for safety), > 300' with single point cane min assist for 1-2 small LOB while multi tasking (walking and talking and opening doors).  Stairs in stairwell x 11 steps min assist especially for stair descent with cane and railing.  Static standing balance: semi tandem, tandem, bil multiple trials at counter, feet together eyes open and eyes closed.  Dynamic balance in hallway: side stepping, high knee marching, retro gait, ball toss with gait, ball bounce with gait, cone taps alternating feet min-mod assist depending on difficulty of activity to maintain balance.    Therapy Documentation Precautions:  Precautions Precautions: Fall Precaution Comments: siezure Restrictions Weight Bearing Restrictions: No Vital Signs: Oxygen Therapy SpO2: 100 % O2 Device: None (Room air) Pain: Pain Assessment Pain Assessment: No/denies pain Pain Score: 0-No pain Locomotion : Ambulation Ambulation/Gait Assistance: 4: Min assist   Therapy/Group: Individual Therapy  Lurena Joiner B. Ottis Vacha, PT, DPT [phone] 05/12/2011, 12:53 PM

## 2011-05-12 NOTE — Progress Notes (Signed)
Speech Language Pathology Daily Session Note  Patient Details  Name: [name] MRN: [id number] Date of Birth: Oct 17, 1945  Today's Date: 05/12/2011 Time: 1415-1430 Time Calculation (min): 15 min  Short Term Goals:  SLP Short Term Goal 1 (Week 1): Pt will utilize external memory aids to increase recall and carryover of newly learned information with supervision semantic cues.  SLP Short Term Goal 2 (Week 1): Pt will demonstrate functional problem solving for basic and familair tasks with supervision verbal cues. SLP Short Term Goal 3 (Week 1): Pt will demonstrate emergent awareness of deficits during functional and familiar tasks with Min A verbal and semantic cues.  SLP Short Term Goal 4 (Week 1): Pt will demonstrate efficient mastication of Dys. 2 textures with Mod A verbal cues SLP Short Term Goal 5 (Week 1): Pt will utilize swallowing compensatory strategies with Mod A verbal and semantic cues.   Skilled Therapeutic Interventions: Treatment focus on recall of complex information in regards to medication management. Pt required supervision verbal cues to recall function of 2 medications. Pt's tx session ended early due to dialysis.    Daily Session Precautions/Restrictions  Precautions Precautions: Fall FIM:  Comprehension Comprehension: 6-Follows complex conversation/direction: With extra time/assistive device Expression Expression: 6-Expresses complex ideas: With extra time/assistive device Social Interaction Social Interaction: 6-Interacts appropriately with others with medication or extra time (anti-anxiety, antidepressant). Problem Solving Problem Solving: 5-Solves complex 90% of the time/cues < 10% of the time Memory Memory: 5-Recognizes or recalls 90% of the time/requires cueing < 10% of the time General  Amount of Missed SLP Time (min): 30 Minutes Missed Time Reason:  (procedure) Pain No/Denies Pain  Therapy/Group: Individual Therapy  [name] 05/12/2011,  3:54 PM

## 2011-05-12 NOTE — Progress Notes (Signed)
Subjective/Complaints: No new issues. Review of Systems  Constitutional: Positive for malaise/fatigue.  All other systems reviewed and are negative.     Objective: Vital Signs: Blood pressure 188/72, pulse 66, temperature 98.3 F (36.8 C), temperature source Oral, resp. rate 18, weight 66.2 kg (145 lb 15.1 oz), SpO2 100.00%. No results found.  Basename 05/10/11 1623  WBC 3.7*  HGB 8.7*  HCT 27.8*  PLT 191    Basename 05/10/11 1624  NA 134*  K 3.6  CL 93*  CO2 29  GLUCOSE 114*  BUN 37*  CREATININE 6.86*  CALCIUM 10.1   CBG (last 3)   Basename 05/11/11 2058 05/11/11 1638 05/11/11 1122  GLUCAP 95 100* 96    Wt Readings from Last 3 Encounters:  05/12/11 66.2 kg (145 lb 15.1 oz)  05/04/11 67.042 kg (147 lb 12.8 oz)  02/27/11 67.5 kg (148 lb 13 oz)    Physical Exam:  General appearance: alert, cooperative and no distress Head: Normocephalic, without obvious abnormality, atraumatic Eyes: EOMI,PERRLA Ears: Ext nl Nose: Nares normal. Septum midline. Mucosa normal. No drainage or sinus tenderness. Throat: lips, mucosa, and tongue normal; teeth and gums normal Neck: no adenopathy, no carotid bruit, no JVD, supple, symmetrical, trachea midline and thyroid not enlarged, symmetric, no tenderness/mass/nodules Back: symmetric, no curvature. ROM normal. No CVA tenderness. Resp: clear to auscultation bilaterally Cardio: regular rate and rhythm, S1, S2 normal, no murmur, click, rub or gallop GI: soft, non-tender; bowel sounds normal; no masses,  no organomegaly Extremities: extremities normal, atraumatic, no cyanosis or edema Pulses: 2+ and symmetric Skin: Skin color, texture, turgor normal. No rashes or lesions Neurologic: alert and oriented name, place, reason. Follows simple one step commands.sitting with good balance at the EOB. Incision/Wound:    Assessment/Plan: 1. Functional deficits secondary to seizure, cardiac arrest, anoxic encephalopathy which require 3+ hours  per day of interdisciplinary therapy in a comprehensive inpatient rehab setting. Physiatrist is providing close team supervision and 24 hour management of active medical problems listed below. Physiatrist and rehab team continue to assess barriers to discharge/monitor patient progress toward functional and medical goals.  Continue toward dc goal of 5/14  FIM: FIM - Bathing Bathing Steps Patient Completed: Chest;Right Arm;Left Arm;Abdomen;Front perineal area;Buttocks;Right upper leg;Left upper leg Bathing: 4: Min-Patient completes 8-9 70f 10 parts or 75+ percent  FIM - Upper Body Dressing/Undressing Upper body dressing/undressing steps patient completed: Thread/unthread right bra strap;Thread/unthread left bra strap;Hook/unhook bra;Thread/unthread right sleeve of pullover shirt/dresss;Thread/unthread left sleeve of pullover shirt/dress;Pull shirt over trunk;Put head through opening of pull over shirt/dress Upper body dressing/undressing: 5: Set-up assist to: Obtain clothing/put away FIM - Lower Body Dressing/Undressing Lower body dressing/undressing steps patient completed: Thread/unthread right underwear leg;Pull underwear up/down;Thread/unthread left underwear leg;Thread/unthread left pants leg;Don/Doff left sock;Don/Doff right sock;Fasten/unfasten pants;Thread/unthread right pants leg;Pull pants up/down Lower body dressing/undressing: 5: Supervision: Safety issues/verbal cues  FIM - Toileting Toileting steps completed by patient: Adjust clothing prior to toileting;Adjust clothing after toileting;Performs perineal hygiene Toileting Assistive Devices: Grab bar or rail for support Toileting: 5: Supervision: Safety issues/verbal cues  FIM - Diplomatic Services operational officer Devices: Grab bars Toilet Transfers: 4-From toilet/BSC: Min A (steadying Pt. > 75%);5-To toilet/BSC: Supervision (verbal cues/safety issues)  FIM - Bed/Chair Transfer Bed/Chair Transfer Assistive Devices: Bed  rails;Arm rests Bed/Chair Transfer: 4: Bed > Chair or W/C: Min A (steadying Pt. > 75%);4: Chair or W/C > Bed: Min A (steadying Pt. > 75%);4: Supine > Sit: Min A (steadying Pt. > 75%/lift 1 leg);5: Sit > Supine:  Supervision (verbal cues/safety issues)  FIM - Locomotion: Wheelchair Locomotion: Wheelchair: 1: Total Assistance/staff pushes wheelchair (Pt<25%) FIM - Locomotion: Ambulation Locomotion: Ambulation Assistive Devices: Designer, industrial/product Ambulation/Gait Assistance: 4: Min guard Locomotion: Ambulation: 4: Travels 150 ft or more with minimal assistance (Pt.>75%)  Comprehension Comprehension Mode: Auditory Comprehension: 4-Understands basic 75 - 89% of the time/requires cueing 10 - 24% of the time  Expression Expression Mode: Verbal Expression: 4-Expresses basic 75 - 89% of the time/requires cueing 10 - 24% of the time. Needs helper to occlude trach/needs to repeat words.  Social Interaction Social Interaction: 5-Interacts appropriately 90% of the time - Needs monitoring or encouragement for participation or interaction.  Problem Solving Problem Solving: 4-Solves basic 75 - 89% of the time/requires cueing 10 - 24% of the time  Memory Memory: 3-Recognizes or recalls 50 - 74% of the time/requires cueing 25 - 49% of the time  1. DVT Prophylaxis/Anticoagulation: Mechanical: Sequential compression devices, below knee Bilateral lower extremities  2. Pain Management: Monitor. Denies pain today 3. Mood: denies any issues with depression or anxiety.  4. Seizures:  KEPPRA STARTED ON Tuesday.   5. ESRD: continue HD on MWFschedule. Per cka 6. Anemia of chronic disease:  7. Hyponatremia: Recheck in HD . Has been ranging from 66 yo 67 134 range.  8. CAD with NICM: continue : baby ASA with lipitor, coreg, and Norvasc 9.  Systolic HTN- poor control at times. 10. AMS- work up really unremarkable so far. MRI without acute change. Labs essentially unremarkable  -VPA changed to keppra and patient has  improved dramatically.  Still a little limited insight and awareness  LOS (Days) 8 A FACE TO FACE EVALUATION WAS PERFORMED  Morad Tal T 05/12/2011, 7:09 AM

## 2011-05-12 NOTE — Progress Notes (Addendum)
Physical Therapy Session Note  Patient Details  Name: [name] MRN: [id number] Date of Birth: Apr 19, 1945  Today's Date: 05/12/2011 Time: 4782-9562 Time Calculation (min): 18 min       Skilled Therapeutic Interventions/Progress Updates:    Gait training for MRADL's-close S collecting clothes in room, openiing and closing drawers, gait to laundry with cane in L hand while carrying bag in in R. Pt needed initial cue to take cane short distance to sink to shave but then states she needed it and demonstrated awareness by stating balance was poor outdoors yesterday and she would always need to use rollator.  1 LOB but pt self corrected.  PT and SW again discussed that 24 hr S is recommended at d/c with pt. Therapy Documentation Precautions:  Precautions Precautions: Fall Precaution Comments: siezure Restrictions Weight Bearing Restrictions: No  See FIM for current functional status  Therapy/Group: Individual Therapy  Michaelene Song 05/12/2011, 12:08 PM

## 2011-05-12 NOTE — Progress Notes (Signed)
Subjective:  No cos, " I doing great with my PT" Objective Vital signs in last 24 hours: Filed Vitals:   05/11/11 1949 05/12/11 0008 05/12/11 0530 05/12/11 0908  BP:  139/80 188/72   Pulse:   66   Temp:   98.3 F (36.8 C)   TempSrc:   Oral   Resp:   18   Weight:   66.2 kg (145 lb 15.1 oz)   SpO2: 97%  100% 100%   Weight change: -0.8 kg (-1 lb 12.2 oz)  Intake/Output Summary (Last 24 hours) at 05/12/11 1046 Last data filed at 05/12/11 0800  Gross per 24 hour  Intake   1460 ml  Output    500 ml  Net    960 ml   Labs: Basic Metabolic Panel:  Lab 05/10/11 1610 05/09/11 0610 05/08/11 1923 05/05/11 1809  NA 134* 138 133* --  K 3.6 3.5 4.2 --  CL 93* 99 95* --  CO2 29 30 23  --  GLUCOSE 114* 67* 71 --  BUN 37* 12 47* --  CREATININE 6.86* 3.91* 8.48* --  CALCIUM 10.1 10.5 10.6* --  ALB -- -- -- --  PHOS 4.7* -- 5.1* 5.8*   Liver Function Tests:  Lab 05/10/11 1624 05/09/11 0610 05/08/11 1923  AST -- 16 --  ALT -- 8 --  ALKPHOS -- 80 --  BILITOT -- 0.2* --  PROT -- 5.9* --  ALBUMIN 2.6* 2.8* 2.8*   No results found for this basename: LIPASE:3,AMYLASE:3 in the last 168 hours  Lab 05/09/11 0610  AMMONIA 36   CBC:  Lab 05/10/11 1623 05/09/11 0610 05/08/11 1923 05/05/11 1809  WBC 3.7* 4.9 3.7* --  NEUTROABS -- -- -- --  HGB 8.7* 10.0* 9.4* --  HCT 27.8* 30.7* 28.4* --  MCV 79.0 77.7* 76.3* 77.8*  PLT 191 192 157 --    Lab 05/12/11 0729 05/11/11 2058 05/11/11 1638 05/11/11 1122 05/11/11 0715  GLUCAP 84 95 100* 96 86  Medications:   . amLODipine  10 mg Oral Daily  . aspirin EC  81 mg Oral Daily  . atorvastatin  10 mg Oral q1800  . carvedilol  25 mg Oral BID WC  . citalopram  20 mg Oral QHS  . diphenhydrAMINE  25 mg Oral Q M,W,F-HD  . feeding supplement (NEPRO CARB STEADY)  237 mL Oral TID WC  . Fluticasone-Salmeterol  1 puff Inhalation Q12H  . hydrALAZINE  25 mg Oral Q6H  . levETIRAcetam  500 mg Oral Q2000  . levothyroxine  88 mcg Oral Daily  .  multivitamin  1 tablet Oral QHS  . nicotine  14 mg Transdermal Daily  . pantoprazole  40 mg Oral Q1200  . sevelamer  2,400 mg Oral TID WC  . thiamine  100 mg Intravenous Daily  . thiamine  200 mg Intravenous Daily  . thiamine  100 mg Oral Daily   I  have reviewed scheduled and prn medications.  Physical Exam: General: alert, ox3 , nad Heart: RRR, 2/6 sem lsb Lungs: Decr. At bases otherwise clear Abdomen: soft, nontender Extremities: Dialysis Access: no pedal edema,pos. Bruit l upper arm avgg/ hero   Problem/Plan:1.Seizure with CPR/anoxic encephalopathy- has encephalomalacia; , she's now back to her baseline;   2.ESRD - HD on MWF @ [facility]; HD today with labs pre-tx  3. Hypokalemia- K 3.6, cont replete with HD; follow , for 4 k bath  4. Hypertension/volume - htn  Pre hd today/ improved with meds (currently on hydralazine,  amlodipine (change to HS) and carvedilol); max UF with HD; follow.  5. Anemia - hemoglobin down 8.7, increase Aranesp and give extra 6.25 this wk; follow trend  6.Metabolic bone disease - phos 4.7, ca down 10.1 (corrected ca+ down 11.06); now on Renvela, and Sensipar; Zemplar d/c'd; cont using 2.25 Ca bath (since needs 4K)  7.Malnutrition - Alb 2.8, on dysphagia 2 with renal restrictions; Nepro scheduled TIDWC; follow  8. HLD- on Lipitor  9. Depression - on Celexa.  10. Hypothyrodism- on synthroid with TSH level euthyroid (2.043)  11. Disposition= plans for 05/16/11  Dc.   Lenny Pastel, PA-C [facility] Beeper [phone] 05/12/2011,10:46 AM  LOS: 8 days  I concur with note of D. Zeyfang, PA-C.  Mentally she remains alert and stable past 2 days after issues earlier in the week with confusion, AMS, expressive aphasia - has clearly improved. Working with PT.  Anticipated d/c date is 5/14.

## 2011-05-13 NOTE — Progress Notes (Signed)
Occupational Therapy Note  Patient Details  Name: [name] MRN: [id number] Date of Birth: 03-Feb-1945 Today's Date: 05/13/2011  Time: 1300-1400 Pt denies pain Group therapy  Pt participated in therapeutic activities group with focus on ambulation with HHA to retrieve items from floor, navigate safely an obstacle course, and engage in dynamic standing activities to increase balance.   Lavone Neri [facility] 05/13/2011, 3:41 PM

## 2011-05-13 NOTE — Progress Notes (Signed)
Subjective/Complaints: Vision denies any complaints.   Review of Systems  Constitutional: Positive for malaise/fatigue.  All other systems reviewed and are negative.   Objective: Vital Signs: Blood pressure 174/61, pulse 63, temperature 98.6 F (37 C), temperature source Oral, resp. rate 16, weight 145 lb 11.6 oz (66.1 kg), SpO2 100.00%. No results found.  Basename 05/12/11 1549 05/10/11 1623  WBC 2.8* 3.7*  HGB 7.9* 8.7*  HCT 24.8* 27.8*  PLT 126* 191    Basename 05/12/11 1549 05/10/11 1624  NA 134* 134*  K 4.4 3.6  CL 95* 93*  CO2 27 29  GLUCOSE 85 114*  BUN 44* 37*  CREATININE 6.71* 6.86*  CALCIUM 10.4 10.1   CBG (last 3)   Basename 05/12/11 2030 05/12/11 1139 05/12/11 0729  GLUCAP 87 102* 84    Wt Readings from Last 3 Encounters:  05/13/11 145 lb 11.6 oz (66.1 kg)  05/04/11 147 lb 12.8 oz (67.042 kg)  02/27/11 148 lb 13 oz (67.5 kg)    Physical Exam:   Elderly, chronically ill-appearing female in no acute distress. HEENT exam atraumatic, normocephalic, neck supple. Chest clear to auscultation cardiac exam S1-S2 are regular. Abdominal exam active bowel sounds, extremities trace edema.  Assessment/Plan: 1. Functional deficits secondary to seizure, cardiac arrest, anoxic encephalopathy which require 3+ hours per day of interdisciplinary therapy in a comprehensive inpatient rehab setting. Physiatrist is providing close team supervision and 24 hour management of active medical problems listed below. Physiatrist and rehab team continue to assess barriers to discharge/monitor patient progress toward functional and medical goals.  Continue toward dc goal of 5/14  FIM: FIM - Bathing Bathing Steps Patient Completed: Chest;Right Arm;Left Arm;Abdomen;Front perineal area;Buttocks;Right upper leg;Left upper leg Bathing: 5: Set-up assist to: Open items  FIM - Upper Body Dressing/Undressing Upper body dressing/undressing steps patient completed: Thread/unthread right  bra strap;Thread/unthread left bra strap;Hook/unhook bra;Thread/unthread right sleeve of pullover shirt/dresss;Thread/unthread left sleeve of pullover shirt/dress;Pull shirt over trunk;Put head through opening of pull over shirt/dress Upper body dressing/undressing: 5: Set-up assist to: Obtain clothing/put away FIM - Lower Body Dressing/Undressing Lower body dressing/undressing steps patient completed: Thread/unthread right underwear leg;Pull underwear up/down;Thread/unthread left underwear leg;Thread/unthread left pants leg;Don/Doff left sock;Don/Doff right sock;Fasten/unfasten pants;Thread/unthread right pants leg;Pull pants up/down Lower body dressing/undressing: 5: Set-up assist to: Obtain clothing  FIM - Toileting Toileting steps completed by patient: Adjust clothing prior to toileting;Adjust clothing after toileting;Performs perineal hygiene Toileting Assistive Devices: Grab bar or rail for support Toileting: 5: Supervision: Safety issues/verbal cues  FIM - Diplomatic Services operational officer Devices: Grab bars Toilet Transfers: 5-To toilet/BSC: Supervision (verbal cues/safety issues);5-From toilet/BSC: Supervision (verbal cues/safety issues)  FIM - Bed/Chair Transfer Bed/Chair Transfer Assistive Devices: Bed rails;Arm rests Bed/Chair Transfer: 7: Supine > Sit: No assist;5: Bed > Chair or W/C: Supervision (verbal cues/safety issues)  FIM - Locomotion: Wheelchair Locomotion: Wheelchair: 0: Activity did not occur FIM - Locomotion: Ambulation Locomotion: Ambulation Assistive Devices:  (rollator) Ambulation/Gait Assistance: 4: Min assist Locomotion: Ambulation: 5: Travels 150 ft or more with supervision/safety issues  Comprehension Comprehension Mode: Auditory Comprehension: 6-Follows complex conversation/direction: With extra time/assistive device  Expression Expression Mode: Verbal Expression: 6-Expresses complex ideas: With extra time/assistive device  Social  Interaction Social Interaction: 6-Interacts appropriately with others with medication or extra time (anti-anxiety, antidepressant).  Problem Solving Problem Solving: 5-Solves complex 90% of the time/cues < 10% of the time  Memory Memory: 5-Recognizes or recalls 90% of the time/requires cueing < 10% of the time  1. DVT Prophylaxis/Anticoagulation: Mechanical: Sequential compression  devices, below knee Bilateral lower extremities  2. Pain Management: . Denies pain today 3. Mood: Affect is normal. 4. Seizures:  KEPPRA STARTED ON Tuesday.   5. ESRD: continue HD on MWFschedule. Per cka 6. Anemia of chronic disease: (Renal failure). Lab Results  Component Value Date   HGB 7.9* 05/12/2011    7. Hyponatremia: Recheck in HD . Has been ranging from 66 yo 134 range. . Basic Metabolic Panel:    Component Value Date/Time   NA 134* 05/12/2011 1549   K 4.4 05/12/2011 1549   CL 95* 05/12/2011 1549   CO2 27 05/12/2011 1549   BUN 44* 05/12/2011 1549   CREATININE 6.71* 05/12/2011 1549   GLUCOSE 85 05/12/2011 1549   CALCIUM 10.4 05/12/2011 1549    8. CAD with NICM: continue : baby ASA with lipitor, coreg, and Norvasc 9.  Systolic HTN- poor control at times. BP Readings from Last 3 Encounters:  05/13/11 174/61  05/04/11 165/70  03/07/11 146/66   will ask nephrology for comment and recommendations.  10. AMS- work up really unremarkable so far. MRI without acute change. Labs essentially unremarkable  -VPA changed to keppra LOS (Days) 9 A FACE TO FACE EVALUATION WAS PERFORMED  Yanni Ruberg HENRY 05/13/2011, 8:32 AM

## 2011-05-14 MED ORDER — HYDRALAZINE HCL 50 MG PO TABS
50.0000 mg | ORAL_TABLET | Freq: Four times a day (QID) | ORAL | Status: DC
  Administered 2011-05-14 – 2011-05-16 (×7): 50 mg via ORAL
  Filled 2011-05-14 (×12): qty 1

## 2011-05-14 NOTE — Progress Notes (Signed)
Patient ID: [name], female   DOB: 1945/07/27, 66 y.o.   MRN: [id number]   [facility] Progress Note    Subjective:   Reports to be feeling well and excited with progress on rehab.   Objective:   BP 177/48  Pulse 61  Temp(Src) 98.1 F (36.7 C) (Oral)  Resp 16  Wt 64.9 kg (143 lb 1.3 oz)  SpO2 99%  Physical Exam: OZH:YQMVHQIONGE resting in bed XBM:WUXLK RRR, normal S1 and S2  Resp:CTA bilaterally, no rales/rhonchi GMW:NUUV, flat, NT, BS normal Ext:NO LE edema  Labs: BMET  Lab 05/12/11 1549 05/10/11 1624 05/09/11 0610 05/08/11 1923  NA 134* 134* 138 133*  K 4.4 3.6 3.5 4.2  CL 95* 93* 99 95*  CO2 27 29 30 23   GLUCOSE 85 114* 67* 71  BUN 44* 37* 12 47*  CREATININE 6.71* 6.86* 3.91* 8.48*  ALB -- -- -- --  CALCIUM 10.4 10.1 10.5 10.6*  PHOS 3.3 4.7* -- 5.1*   CBC  Lab 05/12/11 1549 05/10/11 1623 05/09/11 0610 05/08/11 1923  WBC 2.8* 3.7* 4.9 3.7*  NEUTROABS -- -- -- --  HGB 7.9* 8.7* 10.0* 9.4*  HCT 24.8* 27.8* 30.7* 28.4*  MCV 79.2 79.0 77.7* 76.3*  PLT 126* 191 192 157    @IMGRELPRIORS @ Medications:      . amLODipine  10 mg Oral Daily  . aspirin EC  81 mg Oral Daily  . atorvastatin  10 mg Oral q1800  . carvedilol  25 mg Oral BID WC  . citalopram  20 mg Oral QHS  . diphenhydrAMINE  25 mg Oral Q M,W,F-HD  . feeding supplement (NEPRO CARB STEADY)  237 mL Oral TID WC  . Fluticasone-Salmeterol  1 puff Inhalation Q12H  . hydrALAZINE  50 mg Oral Q6H  . levETIRAcetam  500 mg Oral Q2000  . levothyroxine  88 mcg Oral Daily  . multivitamin  1 tablet Oral QHS  . nicotine  14 mg Transdermal Daily  . pantoprazole  40 mg Oral Q1200  . sevelamer  2,400 mg Oral TID WC  . thiamine  100 mg Intravenous Daily  . thiamine  100 mg Oral Daily  . DISCONTD: hydrALAZINE  25 mg Oral Q6H     Assessment/ Plan:   1.Seizure with CPR/anoxic encephalopathy- brain imaging shows evidence of encephalomalacia; , Neurologically back to her baseline; Progressing  well on PT and anticipated DC on Tuesday 5/14 if all remains stable. 2.ESRD - HD on MWF @ [facility]; HD tomorrow 3. Hypertension/volume - Monitor on hydralazine, amlodipine and carvedilol- plan HD with UF tomorrow.  4. Anemia - hemoglobin down 7.9- will check H/H today and see if she warrants PRBCs at HD tomorrow. 5.Metabolic bone disease - phos 4.7, ca down 10.1 (corrected ca+ down 11.06); now on Renvela, and Sensipar; Zemplar d/c'd; cont using 2.25 Ca bath (since needs 4K)  6.Malnutrition - Alb 2.8, on dysphagia 2 with renal restrictions; Nepro scheduled TIDWC; follow   Zetta Bills, MD 05/14/2011, 10:41 AM

## 2011-05-14 NOTE — Progress Notes (Signed)
Physical Therapy Session Note  Patient Details  Name: [name] MRN: [id number] Date of Birth: [birth year]  Today's Date: 05/14/2011 Time: 0850-0930 Time Calculation (min): 40 min  Short Term Goals: See d/c goals:     Skilled Therapeutic Interventions/Progress Updates:    Pt reporting her balance is better and that the cane is "tripping her up". She stated she was not going to use it after d/c in the home but was going to use rollator in community. PT agreed to assess safety and in fact pt with significantly improved balance and gait today.(obstacle course gathering items while scanning environement with obstacles on ground, curb and steps)  DGI improved to 19 and gait velocity also improved, see below.   There-ex- attempted NUStep for cardiovascular training however pt c/o LE pain at all levels and was unable to tolerate, changed to standing UBE for 8 min, forward and back. Vitals 127/70 HR 62   Therapy Documentation Precautions:  Precautions Precautions: Fall Precaution Comments: siezure Restrictions Weight Bearing Restrictions: No Pain: Pain Assessment Pain Assessment: No/denies pain Pain Score:  (LE discomfort with Nustep pt relates to her stents) Mobility: Transfers Sit to Stand: 7: Independent Stand to Sit: 7: Independent Locomotion : Ambulation Ambulation/Gait Assistance: 5: Supervision Ambulation/Gait Assistance Details: obstacle course Gait Gait velocity: 2.3 ft/sec Stairs / Additional Locomotion Stairs: Yes Stairs Assistance: 6: Modified independent (Device/Increase time) Stair Management Technique: One rail Right Number of Stairs: 5  Curb: 5: Supervision      Balance: Balance Balance Assessed: Yes Standardized Balance Assessment Standardized Balance Assessment: Dynamic Gait Index Dynamic Gait Index Level Surface: Normal Change in Gait Speed: Mild Impairment Gait with Horizontal Head Turns: Mild Impairment Gait with Vertical Head Turns: Mild  Impairment Gait and Pivot Turn: Mild Impairment Step Over Obstacle: Normal Step Around Obstacles: Normal Steps: Mild Impairment Total Score: 19        See FIM for current functional status  Therapy/Group: Individual Therapy  Michaelene Song 05/14/2011, 9:22 AM

## 2011-05-14 NOTE — Progress Notes (Signed)
Subjective/Complaints: Patient denies any complaints.   ROS Objective: Vital Signs: Blood pressure 177/48, pulse 61, temperature 98.1 F (36.7 C), temperature source Oral, resp. rate 16, weight 143 lb 1.3 oz (64.9 kg), SpO2 100.00%. No results found.  Basename 05/12/11 1549  WBC 2.8*  HGB 7.9*  HCT 24.8*  PLT 126*    Basename 05/12/11 1549  NA 134*  K 4.4  CL 95*  CO2 27  GLUCOSE 85  BUN 44*  CREATININE 6.71*  CALCIUM 10.4   CBG (last 3)   Basename 05/12/11 2030 05/12/11 1139 05/12/11 0729  GLUCAP 87 102* 84     Physical Exam:   Elderly, chronically ill-appearing female in no acute distress. HEENT exam atraumatic, normocephalic, neck supple. Chest clear to auscultation cardiac exam S1-S2 are regular. Abdominal exam active bowel sounds, extremities trace edema.  Assessment/Plan: 1. Functional deficits secondary to seizure, cardiac arrest, anoxic encephalopathy which require 3+ hours per day of interdisciplinary therapy in a comprehensive inpatient rehab setting. Physiatrist is providing close team supervision and 24 hour management of active medical problems listed below. Physiatrist and rehab team continue to assess barriers to discharge/monitor patient progress toward functional and medical goals.  Continue toward dc goal of 5/14  FIM: FIM - Bathing Bathing Steps Patient Completed: Chest;Right Arm;Left Arm;Abdomen;Front perineal area;Buttocks;Right upper leg;Left upper leg Bathing: 5: Set-up assist to: Open items  FIM - Upper Body Dressing/Undressing Upper body dressing/undressing steps patient completed: Thread/unthread right bra strap;Thread/unthread left bra strap;Hook/unhook bra;Thread/unthread right sleeve of pullover shirt/dresss;Thread/unthread left sleeve of pullover shirt/dress;Pull shirt over trunk;Put head through opening of pull over shirt/dress Upper body dressing/undressing: 5: Set-up assist to: Obtain clothing/put away FIM - Lower Body  Dressing/Undressing Lower body dressing/undressing steps patient completed: Thread/unthread right underwear leg;Pull underwear up/down;Thread/unthread left underwear leg;Thread/unthread left pants leg;Don/Doff left sock;Don/Doff right sock;Fasten/unfasten pants;Thread/unthread right pants leg;Pull pants up/down Lower body dressing/undressing: 5: Set-up assist to: Obtain clothing  FIM - Toileting Toileting steps completed by patient: Adjust clothing prior to toileting;Adjust clothing after toileting;Performs perineal hygiene Toileting Assistive Devices: Grab bar or rail for support Toileting: 5: Supervision: Safety issues/verbal cues  FIM - Diplomatic Services operational officer Devices: Grab bars Toilet Transfers: 5-To toilet/BSC: Supervision (verbal cues/safety issues);5-From toilet/BSC: Supervision (verbal cues/safety issues)  FIM - Bed/Chair Transfer Bed/Chair Transfer Assistive Devices: Bed rails;Arm rests Bed/Chair Transfer: 7: Supine > Sit: No assist;5: Bed > Chair or W/C: Supervision (verbal cues/safety issues)  FIM - Locomotion: Wheelchair Locomotion: Wheelchair: 0: Activity did not occur FIM - Locomotion: Ambulation Locomotion: Ambulation Assistive Devices: Walker - Rolling;Cane - Straight Ambulation/Gait Assistance: 4: Min assist Locomotion: Ambulation: 5: Travels 150 ft or more with supervision/safety issues  Comprehension Comprehension Mode: Auditory Comprehension: 6-Follows complex conversation/direction: With extra time/assistive device  Expression Expression Mode: Verbal Expression: 6-Expresses complex ideas: With extra time/assistive device  Social Interaction Social Interaction: 6-Interacts appropriately with others with medication or extra time (anti-anxiety, antidepressant).  Problem Solving Problem Solving: 5-Solves complex 90% of the time/cues < 10% of the time  Memory Memory: 5-Recognizes or recalls 90% of the time/requires cueing < 10% of the  time  1. DVT Prophylaxis/Anticoagulation: Mechanical: Sequential compression devices, below knee Bilateral lower extremities  2. Pain Management: . Denies pain today 3. Mood: Affect is normal. 4. Seizures:  On keppra 5. ESRD: continue HD on MWFschedule. Per cka 6. Anemia of chronic disease: (Renal failure). Lab Results  Component Value Date   HGB 7.9* 05/12/2011    7. Hyponatremia: Recheck in HD . Has been ranging  from 66 yo 134 range. . Basic Metabolic Panel:    Component Value Date/Time   NA 134* 05/12/2011 1549   K 4.4 05/12/2011 1549   CL 95* 05/12/2011 1549   CO2 27 05/12/2011 1549   BUN 44* 05/12/2011 1549   CREATININE 6.71* 05/12/2011 1549   GLUCOSE 85 05/12/2011 1549   CALCIUM 10.4 05/12/2011 1549    8. CAD with NICM: continue : baby ASA with lipitor, coreg, and Norvasc 9.  Systolic HTN- poor control at times. BP Readings from Last 3 Encounters:  05/14/11 177/48  05/04/11 165/70  03/07/11 146/66   will ask nephrology for comment and recommendations. For now I will increase hydralazine. 10. AMS- work up really unremarkable so far. MRI without acute change. Labs essentially unremarkable  -VPA changed to keppra LOS (Days) 10 A FACE TO FACE EVALUATION WAS PERFORMED  Meha Vidrine HENRY 05/14/2011, 8:14 AM

## 2011-05-15 ENCOUNTER — Inpatient Hospital Stay (HOSPITAL_COMMUNITY): Payer: Medicare Other

## 2011-05-15 LAB — RENAL FUNCTION PANEL
Albumin: 2.7 g/dL — ABNORMAL LOW (ref 3.5–5.2)
BUN: 78 mg/dL — ABNORMAL HIGH (ref 6–23)
Chloride: 92 mEq/L — ABNORMAL LOW (ref 96–112)
Creatinine, Ser: 8.98 mg/dL — ABNORMAL HIGH (ref 0.50–1.10)

## 2011-05-15 LAB — GLUCOSE, CAPILLARY
Glucose-Capillary: 92 mg/dL (ref 70–99)
Glucose-Capillary: 102 mg/dL — ABNORMAL HIGH (ref 70–99)

## 2011-05-15 MED ORDER — DARBEPOETIN ALFA-POLYSORBATE 200 MCG/0.4ML IJ SOLN
INTRAMUSCULAR | Status: AC
  Administered 2011-05-15: 200 ug via INTRAVENOUS
  Filled 2011-05-15: qty 0.4

## 2011-05-15 MED ORDER — DIPHENHYDRAMINE HCL 25 MG PO CAPS
ORAL_CAPSULE | ORAL | Status: AC
  Administered 2011-05-15: 25 mg via ORAL
  Filled 2011-05-15: qty 1

## 2011-05-15 MED ORDER — DARBEPOETIN ALFA-POLYSORBATE 200 MCG/0.4ML IJ SOLN
200.0000 ug | INTRAMUSCULAR | Status: DC
  Administered 2011-05-15: 200 ug via INTRAVENOUS
  Filled 2011-05-15: qty 0.4

## 2011-05-15 NOTE — Progress Notes (Signed)
Occupational Therapy Discharge Summary  Patient Details  Name: [name] MRN: [id number] Date of Birth: 12-Jul-1945  Today's Date: 05/15/2011 Time: 0900-1005 Time Calculation (min): 65 min  Skilled clinical intervention: Patient seen this am for 1:1 OT to address increased autonomy and safety with morning routine, e.g toileting, shower, gathering necessary items, dressing, laundry, snack prep and light housekeeping.  Patient requesting supervision with walking without device this am due to feeling slightly unsteady after taking a sleeping pill last night.  Spoke with family regarding patient's need for supervision and family in agreement, and has plans in place for patient to move into eldest daughter's home. Patient and family eager for discharge from hospital.  Patient has met 11 of 11 long term goals due to improved activity tolerance, improved balance, ability to compensate for deficits, improved attention, improved awareness and improved coordination.  Patient to discharge at overall Supervision level.  Patient's care partner is independent to provide the necessary physical and cognitive assistance at discharge.  Several family members, including patient's daughter aware of need for supervision for basic self care tasks post discharge.  Reasons goals not met: NA  Recommendation:  Patient has no further OT needs at this time.  Equipment: No equipment provided Patient has a tub seat already.  Reasons for discharge: treatment goals met and discharge from hospital  Patient/family agrees with progress made and goals achieved: Yes  OT Discharge Precautions/Restrictions  Precautions Precautions: Fall Precaution Comments: seizure Restrictions Weight Bearing Restrictions: No Pain Pain Assessment Pain Assessment: No/denies pain Pain Score: 0-No pain   Vision/Perception  Vision - History Baseline Vision: Wears glasses all the time Patient Visual Report: No change from  baseline Vision - Assessment Eye Alignment: Within Functional Limits Vision Assessment: Vision not tested Perception Perception: Within Functional Limits Praxis Praxis: Intact  Cognition Overall Cognitive Status: Impaired at baseline (Recent history of declining memory) Orientation Level: Oriented X4 Attention: Alternating Selective Attention: Appears intact Alternating Attention: Impaired Alternating Attention Impairment: Functional complex Memory: Impaired Memory Impairment: Decreased short term memory;Decreased recall of new information Decreased Short Term Memory: Functional complex Awareness: Appears intact Sensation Sensation Light Touch: Appears Intact Stereognosis: Appears Intact Hot/Cold: Appears Intact Proprioception: Appears Intact Motor  Motor Motor: Within Functional Limits Mobility  Bed Mobility Supine to Sit: 7: Independent Sitting - Scoot to Edge of Bed: 7: Independent Sit to Supine: 7: Independent Transfers Sit to Stand: 7: Independent Stand to Sit: 7: Independent  Trunk/Postural Assessment  Cervical Assessment Cervical Assessment: Within Functional Limits Thoracic Assessment Thoracic Assessment: Within Functional Limits Lumbar Assessment Lumbar Assessment: Within Functional Limits Postural Control Postural Control: Within Functional Limits  Balance Balance Balance Assessed: Yes Static Sitting Balance Static Sitting - Balance Support: No upper extremity supported;Feet supported Dynamic Sitting Balance Dynamic Sitting - Level of Assistance: 7: Independent Static Standing Balance Static Standing - Level of Assistance: 7: Independent Dynamic Standing Balance Dynamic Standing - Comments: DGI 19 Extremity/Trunk Assessment RUE Assessment RUE Assessment: Within Functional Limits LUE Assessment LUE Assessment: Within Functional Limits  See FIM for current functional status  Collier Salina 05/15/2011, 12:11 PM

## 2011-05-15 NOTE — Progress Notes (Signed)
Social Work Patient ID: [name], female   DOB: [birth year], 66  y.o.   MRN: [id number]  Have spoken with pt and daughter, [name] (via phone) this morning to finalize d/c arrangements.  Both confirm pt is to d/c to daughter's Angelique Blonder) home where family will share providing 24/7 supervision.  I have re-routed [facility] van for HD pick up at daughter's address.  OPPT also arranged at [facility].  Per daughter's request, have gathered information for them on local independent living communities and Adult Day Care programs.   Daughter plans to be in to see pt this afternoon.  Preparing for d/c tomorrow.  Ginnie Marich

## 2011-05-15 NOTE — Care Management Note (Signed)
Per State Regulation 482.30 This chart was reviewed for medical necessity with respect to the patient's Admission/Duration of stay. Pt for d/c tomorrow.  Family ed being completed.  Pt has met goals for d/c.   Brock Ra                 Nurse Care Manager              Next Review Date: none

## 2011-05-15 NOTE — Progress Notes (Signed)
Speech Language Pathology Daily Session Note & Discharge Summary   Patient Details  Name: Sarah Baez MRN: [id number] Date of Birth: [birth year]  Today's Date: 05/15/2011 Time: 1330-1415 Time Calculation (min): 45 min  Short Term Goals:  SLP Short Term Goal 1 (Week 1): Pt will utilize external memory aids to increase recall and carryover of newly learned information with supervision semantic cues.  SLP Short Term Goal 2 (Week 1): Pt will demonstrate functional problem solving for basic and familair tasks with supervision verbal cues. SLP Short Term Goal 3 (Week 1): Pt will demonstrate emergent awareness of deficits during functional and familiar tasks with Min A verbal and semantic cues.  SLP Short Term Goal 4 (Week 1): Pt will demonstrate efficient mastication of Dys. 2 textures with Mod A verbal cues SLP Short Term Goal 5 (Week 1): Pt will utilize swallowing compensatory strategies with Mod A verbal and semantic cues.   Skilled Therapeutic Interventions: Treatment focus on family education in regards to current cognitive function and strategies to utilize at home to increase recall and overall independence. Pt and pt's daughter verbalized and demonstrated understanding. Pt demonstrated medication management with use of pill box with supervision semantic cues.   Daily Session FIM:  Comprehension Comprehension Mode: Auditory Comprehension: 7-Follows complex conversation/direction: With no assist Expression Expression Mode: Verbal Expression: 7-Expresses complex ideas: With no assist Social Interaction Social Interaction: 6-Interacts appropriately with others with medication or extra time (anti-anxiety, antidepressant). Problem Solving Problem Solving: 5-Solves complex 90% of the time/cues < 10% of the time Memory Memory: 5-Recognizes or recalls 90% of the time/requires cueing < 10% of the time FIM - Eating Eating Activity: 6: Assistive device: dentures Pain No/Denies  Pain Cognition: Overall Cognitive Status: Impaired at baseline Arousal/Alertness: Awake/alert Orientation Level: Oriented X4 Attention: Alternating Selective Attention: Appears intact Alternating Attention: Impaired Alternating Attention Impairment: Functional complex Memory: Impaired Memory Impairment: Decreased short term memory;Decreased recall of new information Decreased Short Term Memory: Functional complex Awareness: Appears intact Problem Solving: Impaired Problem Solving Impairment: Functional complex Safety/Judgment: Appears intact Oral/Motor: Oral Motor/Sensory Function Overall Oral Motor/Sensory Function: Appears within functional limits for tasks assessed Motor Speech Overall Motor Speech: Appears within functional limits for tasks assessed Comprehension: Auditory Comprehension Overall Auditory Comprehension: Appears within functional limits for tasks assessed Yes/No Questions: Within Functional Limits Commands: Within Functional Limits Conversation: Simple Interfering Components: Processing speed EffectiveTechniques: Extra processing time Visual Recognition/Discrimination Discrimination: Within Function Limits Reading Comprehension Reading Status: Not tested Expression: Expression Primary Mode of Expression: Verbal Verbal Expression Overall Verbal Expression: Appears within functional limits for tasks assessed Initiation: No impairment Automatic Speech: Name;Social Response Level of Generative/Spontaneous Verbalization: Conversation Repetition: No impairment Naming: No impairment Pragmatics: No impairment Effective Techniques: Semantic cues (extra time) Written Expression Dominant Hand: Left Written Expression: Within Functional Limits  Therapy/Group: Individual Therapy  Keitha Kolk 05/15/2011, 4:12 PM  Speech Language Pathology Discharge Summary  Patient Details  Name: Carmeline Kowal MRN: [id number] Date of Birth: 08-27-1945  Today's Date:  05/15/2011 Time: 7829-5621 Time Calculation (min): 45 min  Patient has met 5 of 5 long term goals this admission.  Patient to discharge at overall Supervision level in regards to complex problem solving, awareness and working memory.  Patient's care partner is independent to provide the necessary cognitive assistance at discharge. Pt's daughter and family members to provide 24/7 supervision.   Reasons goals not met: N/A  Recommendation: Patient requires no SLP follow up post CIR discharge.  Equipment: N/A  Reasons for discharge: treatment goals met and  discharge from hospital  Patient/family agrees with progress made and goals achieved: Yes  See FIM for current functional status  Evann Koelzer 05/15/2011, 4:13 PM

## 2011-05-15 NOTE — Plan of Care (Signed)
Problem: RH SAFETY Goal: RH STG ADHERE TO SAFETY PRECAUTIONS W/ASSISTANCE/DEVICE STG Adhere to Safety Precautions With Assistance/Device- independent  Outcome: Not Applicable Date Met:  05/15/11 Patient ambulating with out cane or device indepnednetly

## 2011-05-15 NOTE — Progress Notes (Signed)
Patient requested all meds prior to dialysis . Patient reports she is ready for discharge this am . Independent with ambulating in room from chair to bed . Patient verbalized education on stroke awareness  and medications . Patient tolerating therapy. Continue with plan of care .                                  [name]

## 2011-05-15 NOTE — Progress Notes (Signed)
Nutrition Follow-up  Pt states she is eating well; intake is 95 -100%. Discussed education needs with pt; she states that she's "been on this diet for awhile, I don't need any information."  Diet Order:  Renal 80 - 90  Supplements: Nepro TID  Meds: Scheduled Meds:   . amLODipine  10 mg Oral Daily  . aspirin EC  81 mg Oral Daily  . atorvastatin  10 mg Oral q1800  . carvedilol  25 mg Oral BID WC  . citalopram  20 mg Oral QHS  . diphenhydrAMINE  25 mg Oral Q M,W,F-HD  . feeding supplement (NEPRO CARB STEADY)  237 mL Oral TID WC  . Fluticasone-Salmeterol  1 puff Inhalation Q12H  . hydrALAZINE  50 mg Oral Q6H  . levETIRAcetam  500 mg Oral Q2000  . levothyroxine  88 mcg Oral Daily  . multivitamin  1 tablet Oral QHS  . nicotine  14 mg Transdermal Daily  . pantoprazole  40 mg Oral Q1200  . sevelamer  2,400 mg Oral TID WC  . thiamine  100 mg Intravenous Daily  . thiamine  100 mg Oral Daily   Continuous Infusions:  PRN Meds:.acetaminophen, albuterol, bisacodyl, calcium carbonate (dosed in mg elemental calcium), camphor-menthol, diphenhydrAMINE, guaiFENesin-dextromethorphan, HYDROcodone-acetaminophen, LORazepam, nitroGLYCERIN, ondansetron (ZOFRAN) IV, ondansetron, polyethylene glycol, traZODone  Labs:  CMP     Component Value Date/Time   NA 134* 05/12/2011 1549   K 4.4 05/12/2011 1549   CL 95* 05/12/2011 1549   CO2 27 05/12/2011 1549   GLUCOSE 85 05/12/2011 1549   BUN 44* 05/12/2011 1549   CREATININE 6.71* 05/12/2011 1549   CALCIUM 10.4 05/12/2011 1549   PROT 5.9* 05/09/2011 0610   ALBUMIN 2.7* 05/12/2011 1549   AST 16 05/09/2011 0610   ALT 8 05/09/2011 0610   ALKPHOS 80 05/09/2011 0610   BILITOT 0.2* 05/09/2011 0610   GFRNONAA 6* 05/12/2011 1549   GFRAA 7* 05/12/2011 1549  Phosphorus 3.3 WNL  CBG (last 3)   Basename 05/15/11 1131 05/15/11 0714 05/12/11 2030  GLUCAP 75 92 87     Intake/Output Summary (Last 24 hours) at 05/15/11 1227 Last data filed at 05/15/11 0900  Gross per 24 hour    Intake    720 ml  Output      2 ml  Net    718 ml   Weight Status:  63.6 kg s/p HD on 05/10/11 - stable 64.1 kg s/p HD on 05/07/11  Estimated needs:  1700 - 1900 kcal, 80 -95 grams protein  Nutrition Dx:  Inadequate oral intake r/t variable appetite and waxing/waning mental status AEB variable PO intake. Resolved.  Goal:  Pt to consume >/= 75% of meals and supplements. Met.  Intervention:  Continue Nepro TID  Monitor:  Weights, labs, PO intake  Adair Laundry Pager #:  [phone]

## 2011-05-15 NOTE — Progress Notes (Signed)
Patient ID: [name], female   DOB: 12/02/1945, 66 y.o.   MRN: [id number] Subjective/Complaints: No new issues. Review of Systems  Constitutional: Positive for malaise/fatigue.  All other systems reviewed and are negative.     Objective: Vital Signs: Blood pressure 163/67, pulse 55, temperature 98.1 F (36.7 C), temperature source Oral, resp. rate 16, weight 64.864 kg (143 lb), SpO2 100.00%. No results found.  Basename 05/14/11 1114 05/12/11 1549  WBC -- 2.8*  HGB 8.1* 7.9*  HCT 25.6* 24.8*  PLT -- 126*    Basename 05/12/11 1549  NA 134*  K 4.4  CL 95*  CO2 27  GLUCOSE 85  BUN 44*  CREATININE 6.71*  CALCIUM 10.4   CBG (last 3)   Basename 05/12/11 2030 05/12/11 1139  GLUCAP 87 102*    Wt Readings from Last 3 Encounters:  05/15/11 64.864 kg (143 lb)  05/04/11 67.042 kg (147 lb 12.8 oz)  02/27/11 67.5 kg (148 lb 13 oz)    Physical Exam:  General appearance: alert, cooperative and no distress Head: Normocephalic, without obvious abnormality, atraumatic Eyes: EOMI,PERRLA Ears: Ext nl Nose: Nares normal. Septum midline. Mucosa normal. No drainage or sinus tenderness. Throat: lips, mucosa, and tongue normal; teeth and gums normal Neck: no adenopathy, no carotid bruit, no JVD, supple, symmetrical, trachea midline and thyroid not enlarged, symmetric, no tenderness/mass/nodules Back: symmetric, no curvature. ROM normal. No CVA tenderness. Resp: clear to auscultation bilaterally Cardio: regular rate and rhythm, S1, S2 normal, no murmur, click, rub or gallop GI: soft, non-tender; bowel sounds normal; no masses,  no organomegaly Extremities: extremities normal, atraumatic, no cyanosis or edema Pulses: 2+ and symmetric Skin: Skin color, texture, turgor normal. No rashes or lesions Neurologic: alert and oriented name, place, reason. Follows simple one step commands.standing at sink and lost balance backward once. Was able to catch herself Incision/Wound:     Assessment/Plan: 1. Functional deficits secondary to seizure, cardiac arrest, anoxic encephalopathy which require 3+ hours per day of interdisciplinary therapy in a comprehensive inpatient rehab setting. Physiatrist is providing close team supervision and 24 hour management of active medical problems listed below. Physiatrist and rehab team continue to assess barriers to discharge/monitor patient progress toward functional and medical goals.  Continue toward dc goal of 5/14- balance occasionally off  FIM: FIM - Bathing Bathing Steps Patient Completed: Chest;Right Arm;Left Arm;Abdomen;Front perineal area;Buttocks;Right upper leg;Left upper leg Bathing: 5: Set-up assist to: Open items  FIM - Upper Body Dressing/Undressing Upper body dressing/undressing steps patient completed: Thread/unthread right bra strap;Thread/unthread left bra strap;Hook/unhook bra;Thread/unthread right sleeve of pullover shirt/dresss;Thread/unthread left sleeve of pullover shirt/dress;Pull shirt over trunk;Put head through opening of pull over shirt/dress Upper body dressing/undressing: 5: Set-up assist to: Obtain clothing/put away FIM - Lower Body Dressing/Undressing Lower body dressing/undressing steps patient completed: Thread/unthread right underwear leg;Pull underwear up/down;Thread/unthread left underwear leg;Thread/unthread left pants leg;Don/Doff left sock;Don/Doff right sock;Fasten/unfasten pants;Thread/unthread right pants leg;Pull pants up/down Lower body dressing/undressing: 5: Set-up assist to: Obtain clothing  FIM - Toileting Toileting steps completed by patient: Adjust clothing prior to toileting;Adjust clothing after toileting;Performs perineal hygiene Toileting Assistive Devices: Grab bar or rail for support Toileting: 5: Supervision: Safety issues/verbal cues  FIM - Diplomatic Services operational officer Devices: Grab bars Toilet Transfers: 5-To toilet/BSC: Supervision (verbal cues/safety  issues);5-From toilet/BSC: Supervision (verbal cues/safety issues)  FIM - Bed/Chair Transfer Bed/Chair Transfer Assistive Devices: Bed rails;Arm rests Bed/Chair Transfer: 7: Supine > Sit: No assist;5: Bed > Chair or W/C: Supervision (verbal cues/safety issues)  FIM - Locomotion:  Wheelchair Locomotion: Wheelchair: 0: Activity did not occur FIM - Locomotion: Ambulation Locomotion: Ambulation Assistive Devices: Walker - Rolling;Cane - Straight Ambulation/Gait Assistance: 4: Min assist Locomotion: Ambulation: 5: Travels 150 ft or more with supervision/safety issues  Comprehension Comprehension Mode: Auditory Comprehension: 6-Follows complex conversation/direction: With extra time/assistive device  Expression Expression Mode: Verbal Expression: 6-Expresses complex ideas: With extra time/assistive device  Social Interaction Social Interaction: 6-Interacts appropriately with others with medication or extra time (anti-anxiety, antidepressant).  Problem Solving Problem Solving: 5-Solves complex 90% of the time/cues < 10% of the time  Memory Memory: 5-Recognizes or recalls 90% of the time/requires cueing < 10% of the time  1. DVT Prophylaxis/Anticoagulation: Mechanical: Sequential compression devices, below knee Bilateral lower extremities  2. Pain Management: Monitor. Denies pain today 3. Mood: denies any issues with depression or anxiety.  4. Seizures:  KEPPRA STARTED ON Tuesday.   5. ESRD: continue HD on MWFschedule. Per cka 6. Anemia of chronic disease: hgb trending back up slightly 7. Hyponatremia: Recheck in HD . Has been ranging from 66 yo 134 range.  8. CAD with NICM: continue : baby ASA with lipitor, coreg, and Norvasc 9.  Systolic HTN- poor control at times. 10. AMS- work up really unremarkable so far. MRI without acute change. Labs essentially unremarkable  -VPA changed to keppra and patient has improved dramatically.  Still a little limited insight and awareness  LOS  (Days) 11 A FACE TO FACE EVALUATION WAS PERFORMED  Lizania Bouchard T 05/15/2011, 7:39 AM

## 2011-05-15 NOTE — Progress Notes (Signed)
Subjective:  No complaints, feeling well, ready to go home.  Objective: Vital signs in last 24 hours: Temp:  [98.1 F (36.7 C)-98.2 F (36.8 C)] 98.1 F (36.7 C) (05/13 0543) Pulse Rate:  [55-60] 60  (05/13 0834) Resp:  [16] 16  (05/13 0543) BP: (120-163)/(59-70) 130/68 mmHg (05/13 1229) SpO2:  [99 %-100 %] 100 % (05/13 0543) Weight:  [64.864 kg (143 lb)] 64.864 kg (143 lb) (05/13 0543) Weight change: -0.036 kg (-1.3 oz)  Intake/Output from previous day: 05/12 0701 - 05/13 0700 In: 720 [P.O.:720] Out: 3 [Urine:2; Stool:1] Intake/Output this shift: Total I/O In: 240 [P.O.:240] Out: 2 [Urine:1; Stool:1] EXAM: General appearance: Alert, in no apparent distress Resp:  CTA bilaterally Cardio:  RRR without murmur GI:  + BS, soft and nontender Extremities:  No edema Access:  HeRO @ LUA with + bruit  Lab Results:  Basename 05/14/11 1114 05/12/11 1549  WBC -- 2.8*  HGB 8.1* 7.9*  HCT 25.6* 24.8*  PLT -- 126*   BMET:  Basename 05/12/11 1549  NA 134*  K 4.4  CL 95*  CO2 27  GLUCOSE 85  BUN 44*  CREATININE 6.71*  CALCIUM 10.4  ALBUMIN 2.7*   No results found for this basename: PTH:2 in the last 72 hours Iron Studies: No results found for this basename: IRON,TIBC,TRANSFERRIN,FERRITIN in the last 72 hours  Assessment/Plan: 1. Seizure with CPR/anoxic encephalopathy - evidence of encephalomalacia per brain imaging; now back to baseline.  DC tomorrow. 2. ESRD - HD on MWF @ [place], last K 4.4.  HD pending today. 3. HTN/Volume - BP 130/68 on Amlodipine 10 mg qd, Hydralazine 50 mg q6h, and Carvedilol 25 mg bid; last wt 64.9 with EDW 66 kg.  UF goal of 2-2.5 L today. 4. Anemia - Hgb 8.1 yesterday, off Ananesp.  Give 200 mcg today. 5. Metabolic bone disease - Ca 10.4 (11.4 corrected), P 3.3 on Renvela 3 with meals, off Zemplar. 6. Nutrition - alb 2.7, on Nepro.   LOS: 11 days   LYLES,CHARLES 05/15/2011,1:03 PM   Patient seen and examined and agree with assessment and plan  as above.  Vinson Moselle  MD [facility] [phone] pgr    [phone] cell 05/15/2011, 4:56 PM

## 2011-05-15 NOTE — Progress Notes (Signed)
Physical Therapy Discharge Summary  Patient Details  Name: [name] MRN: [id number] Date of Birth: 1945-02-07  Today's Date: 05/15/2011 Time: 0815-0905 Time Calculation (min): 50 minsession 1:individual therapy, reports of pain in calves only with steps; reports wooziness this morning which she relates to sleeping pill last night, OT informed and NT, pt made mod I for transfers only bed to chair  [place]  B exercise program for balance training, wore 3 # weight (for fall prevention) and did sit to stands without UE support,seated rest breaks were required Climbed 12 steps reciprocally with 1 rail S with increased time Car transfer mod I  Time in/out:1300-1310 Individual therapy Session 2: Education with family regarding fall risk, went over fall risk assessment and gave written copy, they verbalize understanding of recommendation to use rollator out doors  Time in [phone] Individual therapy Pt finished [place] exercise programgait portions(backwards, sidestep and figure eights) per written program and repeated exercise portions  Patient has met 8 of 8 long term goals due to improved activity tolerance, improved balance, improved postural control and improved awareness.  Patient to discharge at an ambulatory level Supervision.  Pt ambulates without device in the home but will use her rollator outdoors.  Patient's care partner is independent to provide the necessary physical and cognitive assistance at discharge. Daughters to stay with pt and provide A as needed.    Pt did have episode during hospital stay of increased confusion, decreased arousal and worsened balance but this resolved with medication changes.    Reasons goals not met: n/a  Recommendation:  Patient will benefit from ongoing skilled PT services in outpatient setting to continue to advance safe functional mobility, address ongoing impairments in standing balance, overall activity tolerance, and minimize fall  risk.  Equipment: No equipment provided  Reasons for discharge: treatment goals met  Patient/family agrees with progress made and goals achieved: Yes  PT Discharge Precautions/Restrictions Precautions Precautions: Fall  Pain Pain Assessment Pain Assessment:  (LE calf discomfort on steps only) Vision/Perception  Vision - History Baseline Vision: Wears glasses all the time  Cognition Overall Cognitive Status: Appears within functional limits for tasks assessed Sensation Sensation Light Touch: Appears Intact Proprioception: Appears Intact Motor  Motor Motor: Within Functional Limits  Mobility Bed Mobility Supine to Sit: 7: Independent Sitting - Scoot to Edge of Bed: 7: Independent Sit to Supine: 7: Independent Transfers Sit to Stand: 7: Independent Stand to Sit: 7: Independent Stand Pivot Transfers: 7: Independent Locomotion  Ambulation Ambulation: Yes Ambulation/Gait Assistance: 5: Supervision Ambulation/Gait Assistance Details: S d/t pt reports she feels "a little woozy" d/t taking sleeping pill last night Stairs / Additional Locomotion Stairs: Yes Stairs Assistance: 5: Supervision Stair Management Technique: One rail Left Number of Stairs: 12  Height of Stairs: 8   Trunk/Postural Assessment  Cervical Assessment Cervical Assessment: Within Functional Limits Thoracic Assessment Thoracic Assessment: Within Functional Limits Lumbar Assessment Lumbar Assessment: Within Functional Limits Postural Control Postural Control: Within Functional Limits  Balance Balance Balance Assessed: Yes Static Sitting Balance Static Sitting - Balance Support: No upper extremity supported;Feet supported (I) Dynamic Sitting Balance Dynamic Sitting - Level of Assistance: 7: Independent Static Standing Balance Static Standing - Level of Assistance: 7: Independent Dynamic Standing Balance Dynamic Standing - Comments: DGI 19 Extremity Assessment      RLE Assessment RLE  Assessment: Within Functional Limits LLE Assessment LLE Assessment: Within Functional Limits Decreased muscular endurance See FIM for current functional status  [name] 05/15/2011, 9:06 AM

## 2011-05-16 DIAGNOSIS — Z5189 Encounter for other specified aftercare: Secondary | ICD-10-CM

## 2011-05-16 DIAGNOSIS — G931 Anoxic brain damage, not elsewhere classified: Secondary | ICD-10-CM

## 2011-05-16 LAB — RENAL FUNCTION PANEL
BUN: 28 mg/dL — ABNORMAL HIGH (ref 6–23)
Chloride: 101 mEq/L (ref 96–112)
Glucose, Bld: 91 mg/dL (ref 70–99)
Phosphorus: 2.9 mg/dL (ref 2.3–4.6)
Potassium: 4.8 mEq/L (ref 3.5–5.1)

## 2011-05-16 MED ORDER — HYDRALAZINE HCL 50 MG PO TABS: 50.0000 mg | ORAL_TABLET | Freq: Four times a day (QID) | ORAL | Status: DC

## 2011-05-16 MED ORDER — NICOTINE 14 MG/24HR TD PT24: MEDICATED_PATCH | TRANSDERMAL | Status: DC

## 2011-05-16 MED ORDER — LEVETIRACETAM 500 MG PO TABS: 500.0000 mg | ORAL_TABLET | Freq: Every day | ORAL | Status: DC

## 2011-05-16 MED ORDER — SEVELAMER CARBONATE 800 MG PO TABS: 2400.0000 mg | ORAL_TABLET | Freq: Three times a day (TID) | ORAL | Status: DC

## 2011-05-16 MED ORDER — THIAMINE HCL 100 MG PO TABS: 100.0000 mg | ORAL_TABLET | Freq: Every day | ORAL | Status: DC

## 2011-05-16 MED ORDER — TRAZODONE HCL 50 MG PO TABS: 50.0000 mg | ORAL_TABLET | Freq: Every evening | ORAL | Status: DC | PRN

## 2011-05-16 NOTE — Discharge Instructions (Signed)
Inpatient Rehab Discharge Instructions  [name] Discharge date and time:  05/16/11  Activities/Precautions/ Functional Status: Activity: ambulate in house Diet: renal diet Max -- 1200 cc fluid per day. Need to watch intake of high potassium foods. Wound Care: none needed Functional status:  ___ No restrictions     ___ Walk up steps independently ___ 24/7 supervision/assistance   ___ Walk up steps with assistance _X__ Intermittent supervision/assistance  ___ Bathe/dress independently ___ Walk with walker     ___ Bathe/dress with assistance ___ Walk Independently    ___ Shower independently ___ Walk with assistance    ___ Shower with assistance _X__ No alcohol     ___ Return to work/school ________   COMMUNITY REFERRALS UPON DISCHARGE:    Outpatient: PT    @ [facility]  [phone])                           1st appointment 5/16 @ 9:45 (arrive at 9:15)                             GENERAL COMMUNITY RESOURCES FOR PATIENT/FAMILY:  Info provided on local Adult Day Care Centers and Independent Living communities   Special Instructions:    My questions have been answered and I understand these instructions. I will adhere to these goals and the provided educational materials after my discharge from the hospital.  Patient/Caregiver Signature _______________________________ Date __________  Clinician Signature _______________________________________ Date __________  Please bring this form and your medication list with you to all your follow-up doctor's appointments.

## 2011-05-16 NOTE — Progress Notes (Addendum)
Subjective:  No complaints, ready to go home.  Objective: Vital signs in last 24 hours: Temp:  [98.1 F (36.7 C)-98.4 F (36.9 C)] 98.4 F (36.9 C) (05/14 0500) Pulse Rate:  [56-69] 64  (05/14 0500) Resp:  [16-22] 17  (05/14 0500) BP: (127-165)/(58-72) 127/66 mmHg (05/14 0500) SpO2:  [94 %-99 %] 94 % (05/14 0903) Weight:  [64.7 kg (142 lb 10.2 oz)-69 kg (152 lb 1.9 oz)] 69 kg (152 lb 1.9 oz) (05/14 0500) Weight change: 2.236 kg (4 lb 14.9 oz)  Intake/Output from previous day: 05/13 0701 - 05/14 0700 In: 720 [P.O.:720] Out: 2509 [Urine:3; Stool:1] Intake/Output this shift: Total I/O In: 240 [P.O.:240] Out: -  EXAM: General appearance:  Alert, in no apparent distress Resp:  CTA bilaterally Cardio: RRR without murmur GI: + BS, soft and nontender Extremities:  No edema Access:  HeRO @ LUA with + bruit  Lab Results:  Basename 05/14/11 1114  WBC --  HGB 8.1*  HCT 25.6*  PLT --   BMET:  Basename 05/16/11 0630 05/15/11 2112  NA 136 131*  K 4.8 5.5*  CL 101 92*  CO2 28 26  GLUCOSE 91 107*  BUN 28* 78*  CREATININE 4.81* 8.98*  CALCIUM 10.1 10.6*  ALBUMIN 2.7* 2.7*   No results found for this basename: PTH:2 in the last 72 hours Iron Studies: No results found for this basename: IRON,TIBC,TRANSFERRIN,FERRITIN in the last 72 hours  Assessment/Plan: 1. Seizure with CPR/anoxic encephalopathy - evidence of encephalomalacia per brain imaging; now back to baseline. DC today. 2. ESRD - HD on MWF @ [place], pre-HD yesterday K 4.8 . HD tomorrow at [facility]. 3. HTN/Volume - BP 127/66 on Amlodipine 10 mg qd, Hydralazine 50 mg q6h, and Carvedilol 25 mg bid; last wt 64.9 with EDW 66 kg; net UF of 2.5 L yesterday. 4. Anemia - last Hgb 8.1, s/p Ananesp 200 mcg yesterday. 5. Metabolic bone disease - Ca 10.1 (11.1 corrected), P 3.3 on Renvela 3 with meals, off Zemplar. 6. Nutrition - alb 2.7, on Nepro.    LOS: 12 days   LYLES,CHARLES 05/16/2011,11:11 AM  Patient seen and examined and  agree with assessment and plan as above.  Vinson Moselle  MD [facility] [phone] pgr    [phone] cell 05/16/2011, 4:08 PM

## 2011-05-16 NOTE — Progress Notes (Signed)
Discharge summary [phone]

## 2011-05-16 NOTE — Progress Notes (Signed)
Late entry: Pt discharge home with family. Discharge instructions provided by Marissa Nestle, PA. Pt escorted off unit in w/c with personal belonging by Lelon Mast, NT.

## 2011-05-16 NOTE — Discharge Summary (Signed)
NAMEKAWANDA, DRUMHELLER NO.:  [id number]  MEDICAL RECORD NO.:  [id number]  LOCATION:  4004                         FACILITY:  [facility]  PHYSICIAN:  Ranelle Oyster, M.D.DATE OF BIRTH:  25-Jun-1945  DATE OF ADMISSION:  05/04/2011 DATE OF DISCHARGE:  05/16/2011                              DISCHARGE SUMMARY   DISCHARGE DIAGNOSES: 1. Deconditioning after seizure activity complicated by     cardiorespiratory arrest. 2. End-stage renal disease. 3. Anemia of chronic disease. 4. Coronary artery disease with NICM.  HISTORY OF PRESENT ILLNESS:  Ms. [name] is a 66 year old female with history of end-stage renal disease, dementia, prior seizure history who had stopped her anti-seizure medicines a year ago. Admitted April 29, 2011, after cardiac arrest, followed by witnessed seizure.  She required 5 minutes of chest compression and had another episode of seizure in ED. Neurology was consulted for input.  The patient was started on Depakote for treatment.  She was noted to have lethargy and confusion that is slowly resolving.  Therapies were initiated and rehab was consulted for progressive therapies.  PAST MEDICAL HISTORY:  Coronary artery disease with stenting to RCA, CVA without residual deficits, renal failure, peripheral vascular disease, hyperlipidemia, history of positive PPD, treated with rifampin, COPD, clear cell cancer of kidney.  FUNCTIONAL HISTORY:  The patient was independent prior to admission. Did her own housework and cooking.  She does not drive.  HOSPITAL COURSE:  [name] was admitted to rehab on May 04, 2011, for inpatient therapies to consist of PT, OT, and speech therapy at least 3 hours 5 days a week.  Past admission, physiatrist, rehab, RN, and therapy team have worked together to provide customized collaborative interdisciplinary care.  Rehab RN has worked with the patient on bowel and bladder program as well as safety plan.   The patient continued hemodialysis on Monday, Wednesday, Friday schedule per the Renal Service.  Routine labs were done with dialysis.  Last labs of May 16, 2011, reveals sodium 136, potassium 4.8, chloride 101, CO2 28 BUN 28, creatinine 4.81, glucose 91.  H and H of May 14, 2011, reveals hemoglobin at 8.1, hematocrit 25.6.  The patient's blood pressures have been checked on b.i.d. basis during this stay.  Systolics have ranged from 130s to occasional high in 160s, diastolics have been in 60s range. Heart rate has been stable in 55 to mid 60 range.  P.o. intake has been good.  Weight at time of discharge is at 69 kgs.   The patient did develop MS changes with lethargy and confusion as well as problems with right-sided ataxia due to intolerance of depakote. MRI  of head was done to rule out any acute changes.  This revealed encephalomalacia left frontal lobe, left parietal lobe and left occipital lobe and cerebellum bilaterally.  No acute infarct was noted.  EEG was done for followup as there was a question of whether the patient could be having subclinical seizures and Neurology was consulted for input.  Dr. Ileana Ladd evaluated the patient.  An EEG done showed no ongoing seizures.  The patient was changed over to Keppra 500 mg b.i.d.  This was adjusted to 500  mg daily due to her end-stage renal disease.  Her confusion and lethargy resolved within 48 hours of discontinuing of Depakote.  The patient continued to make good progress during this stay.  No seizure activity noted during this stay.  During the patient's stay in rehab, weekly team conferences were held to monitor the patient's progress, set goals, as well as discuss barriers to discharge.  At time of admission, the patient was noted to have decreased memories with poor recall and carry over, decreased awareness of deficits as well as poor problem solving.  The patient's family repeated the patient with impaired cognition at  baseline.  She has progressed to being at supervision level in regards to complex problem solving, awareness and working memory.  She requires extra processing time.  Comprehension seems within functional limits for basic tasks. Physical therapy has worked with the patient on activity tolerance, balance, postural control as well as awareness.  Currently, the patient is able to ambulate at supervision level outside a controlled environment.  She can ambulate in home setting without a device, but will required to use her rollator outdoors.  The patient's daughters aware of need to provide supervision past discharge.  OT has worked with the patient on self-care tasks.  The patient is at supervision level for bathing and dressing.  The family will provide supervision assistance as needed past discharge.  DISCHARGE MEDICATIONS: 1. Keppra 500 mg p.o. q.p.m. 2. Nicotine patch 14 mg changed daily for 4 weeks, then taper to 7 mg     for 4 weeks, then stop. 3. Vitamin B1 100 mg p.o. per day. 4. Hydralazine 50 mg q.i.d. 5. Renvela 2400 mg t.i.d. 6. Trazodone 50 mg at bedtime p.r.n. insomnia. 7. Combivent inhaler 2 puffs q.i.d. p.r.n. wheezing. 8. Norvasc 10 mg p.o. per day. 9. Coated aspirin 81 mg a day. 10.Coreg 25 mg b.i.d. 11.Celexa 20 mg a day. 12.Dexilant 60 mg a day. 13.Advair 100-50 one puff q.12 hours. 14.Levothyroxine 88 mcg per day. 15.Multivitamin 1 per day. 16.MiraLAX 17 g in 8 ounces p.o. per day p.r.n. 17.Nitroglycerin p.r.n. chest pain. 18.Pravastatin 40 mg per day.  DIET:  Renal diet with 1200 mL fluid restriction. Limit high potasium foods.  ACTIVITY LEVEL:  As tolerated with supervision out of home setting, intermittent supervision in home setting, use walker when out of home.  SPECIAL INSTRUCTIONS:  Do not use tramadol.  Outpatient physical therapy at [facility] to begin on May 18, 2011, at 9:45.  Information provided about local adult day care as well as  independent living communities.  FOLLOWUP:  The patient to follow up with Dr. Ivory Broad on June 20, 2011, at 10:30. Follow up with Dr. Stephanie Acre in 2-3 weeks. Follow up with Dr. Velna Hatchet May 25, 2011, at 2 p.m.     [name] Reining, P.A.   ______________________________ Ranelle Oyster, M.D.    PL/MEDQ  D:  05/16/2011  T:  05/16/2011  Job:  [id number]

## 2011-05-16 NOTE — Progress Notes (Signed)
Patient ID: [name], female   DOB: 03-15-1945, 66 y.o.   MRN: [id number] Patient ID: [name], female   DOB: 1945/12/19, 66 y.o.   MRN: [id number] Subjective/Complaints: No new issues. Excited to be going home.  Review of Systems  Constitutional: Positive for malaise/fatigue.  All other systems reviewed and are negative.     Objective: Vital Signs: Blood pressure 127/66, pulse 64, temperature 98.4 F (36.9 C), temperature source Oral, resp. rate 17, weight 69 kg (152 lb 1.9 oz), SpO2 99.00%. No results found.  Basename 05/14/11 1114  WBC --  HGB 8.1*  HCT 25.6*  PLT --    [facility] 05/15/11 2112  NA 131*  K 5.5*  CL 92*  CO2 26  GLUCOSE 107*  BUN 78*  CREATININE 8.98*  CALCIUM 10.6*   CBG (last 3)   Basename 05/15/11 1618 05/15/11 1131 05/15/11 0714  GLUCAP 102* 75 92    Wt Readings from Last 3 Encounters:  05/16/11 69 kg (152 lb 1.9 oz)  05/04/11 67.042 kg (147 lb 12.8 oz)  02/27/11 67.5 kg (148 lb 13 oz)    Physical Exam:  General appearance: alert, cooperative and no distress Head: Normocephalic, without obvious abnormality, atraumatic Eyes: EOMI,PERRLA Ears: Ext nl Nose: Nares normal. Septum midline. Mucosa normal. No drainage or sinus tenderness. Throat: lips, mucosa, and tongue normal; teeth and gums normal Neck: no adenopathy, no carotid bruit, no JVD, supple, symmetrical, trachea midline and thyroid not enlarged, symmetric, no tenderness/mass/nodules Back: symmetric, no curvature. ROM normal. No CVA tenderness. Resp: clear to auscultation bilaterally Cardio: regular rate and rhythm, S1, S2 normal, no murmur, click, rub or gallop GI: soft, non-tender; bowel sounds normal; no masses,  no organomegaly Extremities: extremities normal, atraumatic, no cyanosis or edema Pulses: 2+ and symmetric Skin: Skin color, texture, turgor normal. No rashes or lesions Neurologic: alert and oriented name, place, reason. Follows simple one step commands. Moves  all 4 equally. No sensory issues. Incision/Wound:    Assessment/Plan: 1. Functional deficits secondary to seizure, cardiac arrest, anoxic encephalopathy which require 3+ hours per day of interdisciplinary therapy in a comprehensive inpatient rehab setting. Physiatrist is providing close team supervision and 24 hour management of active medical problems listed below. Physiatrist and rehab team continue to assess barriers to discharge/monitor patient progress toward functional and medical goals.  Dc today   FIM: FIM - Bathing Bathing Steps Patient Completed: Chest;Right Arm;Left Arm;Abdomen;Front perineal area;Buttocks;Right upper leg;Left lower leg (including foot);Right lower leg (including foot);Left upper leg Bathing: 5: Supervision: Safety issues/verbal cues  FIM - Upper Body Dressing/Undressing Upper body dressing/undressing steps patient completed: Thread/unthread right bra strap;Thread/unthread left bra strap;Hook/unhook bra;Thread/unthread right sleeve of pullover shirt/dresss;Thread/unthread left sleeve of pullover shirt/dress;Put head through opening of pull over shirt/dress;Pull shirt over trunk Upper body dressing/undressing: 5: Supervision: Safety issues/verbal cues FIM - Lower Body Dressing/Undressing Lower body dressing/undressing steps patient completed: Thread/unthread right underwear leg;Thread/unthread left underwear leg;Pull underwear up/down;Thread/unthread right pants leg;Thread/unthread left pants leg;Pull pants up/down;Fasten/unfasten pants;Don/Doff right sock;Don/Doff left sock;Don/Doff right shoe;Don/Doff left shoe Lower body dressing/undressing: 5: Supervision: Safety issues/verbal cues  FIM - Toileting Toileting steps completed by patient: Adjust clothing prior to toileting;Performs perineal hygiene;Adjust clothing after toileting Toileting Assistive Devices: Grab bar or rail for support Toileting: 7: Independent: No helper, no device  FIM - Ambulance person Devices: Grab bars Toilet Transfers: 5-To toilet/BSC: Supervision (verbal cues/safety issues);5-From toilet/BSC: Supervision (verbal cues/safety issues)  FIM - Bed/Chair Transfer Bed/Chair Transfer Assistive Devices: Bed rails;Arm rests Bed/Chair  Transfer: 7: Independent: No helper  FIM - Locomotion: Wheelchair Locomotion: Wheelchair: 0: Activity did not occur FIM - Locomotion: Ambulation Locomotion: Ambulation Assistive Devices: Walker - Rolling;Cane - Straight Ambulation/Gait Assistance: 5: Supervision Locomotion: Ambulation: 5: Travels 150 ft or more with supervision/safety issues  Comprehension Comprehension Mode: Auditory Comprehension: 7-Follows complex conversation/direction: With no assist  Expression Expression Mode: Verbal Expression: 7-Expresses complex ideas: With no assist  Social Interaction Social Interaction: 6-Interacts appropriately with others with medication or extra time (anti-anxiety, antidepressant).  Problem Solving Problem Solving: 5-Solves basic 90% of the time/requires cueing < 10% of the time  Memory Memory: 5-Recognizes or recalls 90% of the time/requires cueing < 10% of the time  1. DVT Prophylaxis/Anticoagulation: Mechanical: Sequential compression devices, below knee Bilateral lower extremities  2. Pain Management: Monitor. Denies pain today 3. Mood: denies any issues with depression or anxiety.  4. Seizures:  KEPPRA STARTED ON Tuesday.   5. ESRD: continue HD on MWFschedule. Per cka 6. Anemia of chronic disease: hgb trending back up slightly 7. Hyponatremia: Recheck in HD . Has been ranging from 66 yo 134 range.  8. CAD with NICM: continue : baby ASA with lipitor, coreg, and Norvasc 9.  Systolic HTN- poor control at times. 10. AMS- work up really unremarkable so far. MRI without acute change. Labs essentially unremarkable  -VPA changed to keppra and patient has improved dramatically.  Still a little  limited with insight and awareness  LOS (Days) 12 A FACE TO FACE EVALUATION WAS PERFORMED  Danajah Birdsell T 05/16/2011, 7:28 AM

## 2011-05-16 NOTE — Progress Notes (Signed)
Social Work  Discharge Note  The overall goal for the admission was met for:   Discharge location: Yes - Home with daughter (two daughters to share 24/7 supervision)  Length of Stay: Yes - 12 days  Discharge activity level: Yes - supervision to modified independent  Home/community participation: Yes  Services provided included: MD, RD, PT, OT, SLP, RN, CM, TR, Pharmacy and SW  Financial Services: Medicare and Private Insurance: BCBS  Follow-up services arranged: Outpatient: PT via [facility] and Patient/Family has no preference for HH/DME agencies  Comments (or additional information):  Provided daughters info on Adult Day Care Programs and independent living communities  Patient/Family verbalized understanding of follow-up arrangements: Yes  Individual responsible for coordination of the follow-up plan:  daughter  Confirmed correct DME delivered:  NA  Nikos Anglemyer

## 2011-05-17 DIAGNOSIS — N186 End stage renal disease: Secondary | ICD-10-CM | POA: Diagnosis not present

## 2011-05-17 DIAGNOSIS — E119 Type 2 diabetes mellitus without complications: Secondary | ICD-10-CM | POA: Diagnosis not present

## 2011-05-17 DIAGNOSIS — N2581 Secondary hyperparathyroidism of renal origin: Secondary | ICD-10-CM | POA: Diagnosis not present

## 2011-05-17 DIAGNOSIS — D509 Iron deficiency anemia, unspecified: Secondary | ICD-10-CM | POA: Diagnosis not present

## 2011-05-17 DIAGNOSIS — N039 Chronic nephritic syndrome with unspecified morphologic changes: Secondary | ICD-10-CM | POA: Diagnosis not present

## 2011-05-17 DIAGNOSIS — D631 Anemia in chronic kidney disease: Secondary | ICD-10-CM | POA: Diagnosis not present

## 2011-05-18 ENCOUNTER — Ambulatory Visit: Payer: Medicare Other | Admitting: Physical Therapy

## 2011-05-18 ENCOUNTER — Ambulatory Visit: Payer: Medicare Other | Attending: Physical Medicine & Rehabilitation | Admitting: Physical Therapy

## 2011-05-18 DIAGNOSIS — IMO0001 Reserved for inherently not codable concepts without codable children: Secondary | ICD-10-CM | POA: Insufficient documentation

## 2011-05-18 DIAGNOSIS — R269 Unspecified abnormalities of gait and mobility: Secondary | ICD-10-CM | POA: Diagnosis not present

## 2011-05-19 DIAGNOSIS — D509 Iron deficiency anemia, unspecified: Secondary | ICD-10-CM | POA: Diagnosis not present

## 2011-05-19 DIAGNOSIS — N039 Chronic nephritic syndrome with unspecified morphologic changes: Secondary | ICD-10-CM | POA: Diagnosis not present

## 2011-05-19 DIAGNOSIS — N186 End stage renal disease: Secondary | ICD-10-CM | POA: Diagnosis not present

## 2011-05-19 DIAGNOSIS — E119 Type 2 diabetes mellitus without complications: Secondary | ICD-10-CM | POA: Diagnosis not present

## 2011-05-19 DIAGNOSIS — N2581 Secondary hyperparathyroidism of renal origin: Secondary | ICD-10-CM | POA: Diagnosis not present

## 2011-05-22 DIAGNOSIS — D509 Iron deficiency anemia, unspecified: Secondary | ICD-10-CM | POA: Diagnosis not present

## 2011-05-22 DIAGNOSIS — N186 End stage renal disease: Secondary | ICD-10-CM | POA: Diagnosis not present

## 2011-05-22 DIAGNOSIS — N039 Chronic nephritic syndrome with unspecified morphologic changes: Secondary | ICD-10-CM | POA: Diagnosis not present

## 2011-05-22 DIAGNOSIS — E119 Type 2 diabetes mellitus without complications: Secondary | ICD-10-CM | POA: Diagnosis not present

## 2011-05-22 DIAGNOSIS — N2581 Secondary hyperparathyroidism of renal origin: Secondary | ICD-10-CM | POA: Diagnosis not present

## 2011-05-23 ENCOUNTER — Ambulatory Visit: Payer: Medicare Other | Admitting: Physical Therapy

## 2011-05-24 DIAGNOSIS — D631 Anemia in chronic kidney disease: Secondary | ICD-10-CM | POA: Diagnosis not present

## 2011-05-24 DIAGNOSIS — N2581 Secondary hyperparathyroidism of renal origin: Secondary | ICD-10-CM | POA: Diagnosis not present

## 2011-05-24 DIAGNOSIS — N186 End stage renal disease: Secondary | ICD-10-CM | POA: Diagnosis not present

## 2011-05-24 DIAGNOSIS — D509 Iron deficiency anemia, unspecified: Secondary | ICD-10-CM | POA: Diagnosis not present

## 2011-05-24 DIAGNOSIS — E119 Type 2 diabetes mellitus without complications: Secondary | ICD-10-CM | POA: Diagnosis not present

## 2011-05-25 ENCOUNTER — Ambulatory Visit: Payer: Medicare Other | Admitting: *Deleted

## 2011-05-26 DIAGNOSIS — D509 Iron deficiency anemia, unspecified: Secondary | ICD-10-CM | POA: Diagnosis not present

## 2011-05-26 DIAGNOSIS — D631 Anemia in chronic kidney disease: Secondary | ICD-10-CM | POA: Diagnosis not present

## 2011-05-26 DIAGNOSIS — N186 End stage renal disease: Secondary | ICD-10-CM | POA: Diagnosis not present

## 2011-05-26 DIAGNOSIS — E119 Type 2 diabetes mellitus without complications: Secondary | ICD-10-CM | POA: Diagnosis not present

## 2011-05-26 DIAGNOSIS — N2581 Secondary hyperparathyroidism of renal origin: Secondary | ICD-10-CM | POA: Diagnosis not present

## 2011-05-29 DIAGNOSIS — N039 Chronic nephritic syndrome with unspecified morphologic changes: Secondary | ICD-10-CM | POA: Diagnosis not present

## 2011-05-29 DIAGNOSIS — D509 Iron deficiency anemia, unspecified: Secondary | ICD-10-CM | POA: Diagnosis not present

## 2011-05-29 DIAGNOSIS — N186 End stage renal disease: Secondary | ICD-10-CM | POA: Diagnosis not present

## 2011-05-29 DIAGNOSIS — E119 Type 2 diabetes mellitus without complications: Secondary | ICD-10-CM | POA: Diagnosis not present

## 2011-05-29 DIAGNOSIS — N2581 Secondary hyperparathyroidism of renal origin: Secondary | ICD-10-CM | POA: Diagnosis not present

## 2011-05-30 ENCOUNTER — Ambulatory Visit: Payer: Medicare Other | Admitting: Physical Therapy

## 2011-05-30 ENCOUNTER — Other Ambulatory Visit: Payer: Self-pay | Admitting: Internal Medicine

## 2011-05-30 NOTE — Telephone Encounter (Signed)
Patient has not been seen here yet.  Please advise on refill.

## 2011-05-30 NOTE — Telephone Encounter (Signed)
This patient also needs appointment.

## 2011-05-31 DIAGNOSIS — E119 Type 2 diabetes mellitus without complications: Secondary | ICD-10-CM | POA: Diagnosis not present

## 2011-05-31 DIAGNOSIS — N186 End stage renal disease: Secondary | ICD-10-CM | POA: Diagnosis not present

## 2011-05-31 DIAGNOSIS — D509 Iron deficiency anemia, unspecified: Secondary | ICD-10-CM | POA: Diagnosis not present

## 2011-05-31 DIAGNOSIS — N2581 Secondary hyperparathyroidism of renal origin: Secondary | ICD-10-CM | POA: Diagnosis not present

## 2011-05-31 DIAGNOSIS — D631 Anemia in chronic kidney disease: Secondary | ICD-10-CM | POA: Diagnosis not present

## 2011-05-31 NOTE — Telephone Encounter (Signed)
Left message asking patient to return my call.

## 2011-06-01 ENCOUNTER — Ambulatory Visit: Payer: Medicare Other | Admitting: *Deleted

## 2011-06-01 ENCOUNTER — Ambulatory Visit: Payer: Medicare Other | Admitting: Physical Therapy

## 2011-06-02 DIAGNOSIS — E119 Type 2 diabetes mellitus without complications: Secondary | ICD-10-CM | POA: Diagnosis not present

## 2011-06-02 DIAGNOSIS — N039 Chronic nephritic syndrome with unspecified morphologic changes: Secondary | ICD-10-CM | POA: Diagnosis not present

## 2011-06-02 DIAGNOSIS — D509 Iron deficiency anemia, unspecified: Secondary | ICD-10-CM | POA: Diagnosis not present

## 2011-06-02 DIAGNOSIS — N186 End stage renal disease: Secondary | ICD-10-CM | POA: Diagnosis not present

## 2011-06-02 DIAGNOSIS — N2581 Secondary hyperparathyroidism of renal origin: Secondary | ICD-10-CM | POA: Diagnosis not present

## 2011-06-05 DIAGNOSIS — N2581 Secondary hyperparathyroidism of renal origin: Secondary | ICD-10-CM | POA: Diagnosis not present

## 2011-06-05 DIAGNOSIS — N039 Chronic nephritic syndrome with unspecified morphologic changes: Secondary | ICD-10-CM | POA: Diagnosis not present

## 2011-06-05 DIAGNOSIS — D509 Iron deficiency anemia, unspecified: Secondary | ICD-10-CM | POA: Diagnosis not present

## 2011-06-05 DIAGNOSIS — D631 Anemia in chronic kidney disease: Secondary | ICD-10-CM | POA: Diagnosis not present

## 2011-06-05 DIAGNOSIS — N186 End stage renal disease: Secondary | ICD-10-CM | POA: Diagnosis not present

## 2011-06-05 DIAGNOSIS — N390 Urinary tract infection, site not specified: Secondary | ICD-10-CM | POA: Diagnosis not present

## 2011-06-06 ENCOUNTER — Ambulatory Visit: Payer: Medicare Other | Admitting: Physical Therapy

## 2011-06-07 DIAGNOSIS — N390 Urinary tract infection, site not specified: Secondary | ICD-10-CM | POA: Diagnosis not present

## 2011-06-07 DIAGNOSIS — D509 Iron deficiency anemia, unspecified: Secondary | ICD-10-CM | POA: Diagnosis not present

## 2011-06-07 DIAGNOSIS — N039 Chronic nephritic syndrome with unspecified morphologic changes: Secondary | ICD-10-CM | POA: Diagnosis not present

## 2011-06-07 DIAGNOSIS — N2581 Secondary hyperparathyroidism of renal origin: Secondary | ICD-10-CM | POA: Diagnosis not present

## 2011-06-07 DIAGNOSIS — N186 End stage renal disease: Secondary | ICD-10-CM | POA: Diagnosis not present

## 2011-06-08 ENCOUNTER — Ambulatory Visit: Payer: Medicare Other | Attending: Physical Medicine & Rehabilitation | Admitting: Physical Therapy

## 2011-06-08 DIAGNOSIS — R269 Unspecified abnormalities of gait and mobility: Secondary | ICD-10-CM | POA: Diagnosis not present

## 2011-06-08 DIAGNOSIS — IMO0001 Reserved for inherently not codable concepts without codable children: Secondary | ICD-10-CM | POA: Diagnosis not present

## 2011-06-09 DIAGNOSIS — N390 Urinary tract infection, site not specified: Secondary | ICD-10-CM | POA: Diagnosis not present

## 2011-06-09 DIAGNOSIS — D509 Iron deficiency anemia, unspecified: Secondary | ICD-10-CM | POA: Diagnosis not present

## 2011-06-09 DIAGNOSIS — N2581 Secondary hyperparathyroidism of renal origin: Secondary | ICD-10-CM | POA: Diagnosis not present

## 2011-06-09 DIAGNOSIS — N186 End stage renal disease: Secondary | ICD-10-CM | POA: Diagnosis not present

## 2011-06-09 DIAGNOSIS — D631 Anemia in chronic kidney disease: Secondary | ICD-10-CM | POA: Diagnosis not present

## 2011-06-12 DIAGNOSIS — D509 Iron deficiency anemia, unspecified: Secondary | ICD-10-CM | POA: Diagnosis not present

## 2011-06-12 DIAGNOSIS — N390 Urinary tract infection, site not specified: Secondary | ICD-10-CM | POA: Diagnosis not present

## 2011-06-12 DIAGNOSIS — D631 Anemia in chronic kidney disease: Secondary | ICD-10-CM | POA: Diagnosis not present

## 2011-06-12 DIAGNOSIS — N2581 Secondary hyperparathyroidism of renal origin: Secondary | ICD-10-CM | POA: Diagnosis not present

## 2011-06-12 DIAGNOSIS — N186 End stage renal disease: Secondary | ICD-10-CM | POA: Diagnosis not present

## 2011-06-13 ENCOUNTER — Other Ambulatory Visit: Payer: Self-pay | Admitting: Interventional Radiology

## 2011-06-13 ENCOUNTER — Ambulatory Visit: Payer: Medicare Other | Admitting: Physical Therapy

## 2011-06-13 DIAGNOSIS — N2889 Other specified disorders of kidney and ureter: Secondary | ICD-10-CM

## 2011-06-14 DIAGNOSIS — D509 Iron deficiency anemia, unspecified: Secondary | ICD-10-CM | POA: Diagnosis not present

## 2011-06-14 DIAGNOSIS — N186 End stage renal disease: Secondary | ICD-10-CM | POA: Diagnosis not present

## 2011-06-14 DIAGNOSIS — N390 Urinary tract infection, site not specified: Secondary | ICD-10-CM | POA: Diagnosis not present

## 2011-06-14 DIAGNOSIS — N2581 Secondary hyperparathyroidism of renal origin: Secondary | ICD-10-CM | POA: Diagnosis not present

## 2011-06-14 DIAGNOSIS — D631 Anemia in chronic kidney disease: Secondary | ICD-10-CM | POA: Diagnosis not present

## 2011-06-15 ENCOUNTER — Ambulatory Visit: Payer: Medicare Other | Admitting: Physical Therapy

## 2011-06-16 DIAGNOSIS — N390 Urinary tract infection, site not specified: Secondary | ICD-10-CM | POA: Diagnosis not present

## 2011-06-16 DIAGNOSIS — N2581 Secondary hyperparathyroidism of renal origin: Secondary | ICD-10-CM | POA: Diagnosis not present

## 2011-06-16 DIAGNOSIS — N186 End stage renal disease: Secondary | ICD-10-CM | POA: Diagnosis not present

## 2011-06-16 DIAGNOSIS — N039 Chronic nephritic syndrome with unspecified morphologic changes: Secondary | ICD-10-CM | POA: Diagnosis not present

## 2011-06-16 DIAGNOSIS — D509 Iron deficiency anemia, unspecified: Secondary | ICD-10-CM | POA: Diagnosis not present

## 2011-06-19 DIAGNOSIS — D509 Iron deficiency anemia, unspecified: Secondary | ICD-10-CM | POA: Diagnosis not present

## 2011-06-19 DIAGNOSIS — N039 Chronic nephritic syndrome with unspecified morphologic changes: Secondary | ICD-10-CM | POA: Diagnosis not present

## 2011-06-19 DIAGNOSIS — N186 End stage renal disease: Secondary | ICD-10-CM | POA: Diagnosis not present

## 2011-06-19 DIAGNOSIS — N2581 Secondary hyperparathyroidism of renal origin: Secondary | ICD-10-CM | POA: Diagnosis not present

## 2011-06-19 DIAGNOSIS — N390 Urinary tract infection, site not specified: Secondary | ICD-10-CM | POA: Diagnosis not present

## 2011-06-20 ENCOUNTER — Encounter: Payer: Self-pay | Admitting: Physical Medicine & Rehabilitation

## 2011-06-20 ENCOUNTER — Encounter: Payer: Medicare Other | Attending: Physical Medicine & Rehabilitation | Admitting: Physical Medicine & Rehabilitation

## 2011-06-20 VITALS — BP 149/69 | HR 57 | Resp 16 | Ht 62.0 in | Wt 148.6 lb

## 2011-06-20 DIAGNOSIS — G931 Anoxic brain damage, not elsewhere classified: Secondary | ICD-10-CM

## 2011-06-20 DIAGNOSIS — E785 Hyperlipidemia, unspecified: Secondary | ICD-10-CM | POA: Insufficient documentation

## 2011-06-20 DIAGNOSIS — I739 Peripheral vascular disease, unspecified: Secondary | ICD-10-CM | POA: Diagnosis not present

## 2011-06-20 DIAGNOSIS — I2510 Atherosclerotic heart disease of native coronary artery without angina pectoris: Secondary | ICD-10-CM | POA: Insufficient documentation

## 2011-06-20 DIAGNOSIS — R5381 Other malaise: Secondary | ICD-10-CM | POA: Diagnosis not present

## 2011-06-20 DIAGNOSIS — Z8673 Personal history of transient ischemic attack (TIA), and cerebral infarction without residual deficits: Secondary | ICD-10-CM | POA: Insufficient documentation

## 2011-06-20 DIAGNOSIS — I428 Other cardiomyopathies: Secondary | ICD-10-CM | POA: Insufficient documentation

## 2011-06-20 DIAGNOSIS — I120 Hypertensive chronic kidney disease with stage 5 chronic kidney disease or end stage renal disease: Secondary | ICD-10-CM | POA: Insufficient documentation

## 2011-06-20 DIAGNOSIS — I10 Essential (primary) hypertension: Secondary | ICD-10-CM

## 2011-06-20 DIAGNOSIS — E039 Hypothyroidism, unspecified: Secondary | ICD-10-CM | POA: Insufficient documentation

## 2011-06-20 DIAGNOSIS — D638 Anemia in other chronic diseases classified elsewhere: Secondary | ICD-10-CM | POA: Insufficient documentation

## 2011-06-20 DIAGNOSIS — G40909 Epilepsy, unspecified, not intractable, without status epilepticus: Secondary | ICD-10-CM | POA: Insufficient documentation

## 2011-06-20 DIAGNOSIS — F17200 Nicotine dependence, unspecified, uncomplicated: Secondary | ICD-10-CM | POA: Diagnosis not present

## 2011-06-20 DIAGNOSIS — N186 End stage renal disease: Secondary | ICD-10-CM | POA: Insufficient documentation

## 2011-06-20 DIAGNOSIS — R569 Unspecified convulsions: Secondary | ICD-10-CM | POA: Diagnosis not present

## 2011-06-20 NOTE — Progress Notes (Signed)
Subjective:    Patient ID: [name], female    DOB: [birth year], 66 y.o.   MRN: [id number]  HPI  [name] is back regarding her seizure disorder. She was with [place] on rehab a month ago.  Now, she is with her daughter, and she wants to return home alone. There has been no further seizure activity.   She was home by herself on Sunday, and she did well.   She reports no problems with her memory, balance, strength. She was discharged from the neuro rehab center last week. She's exercising daily.  She remains on HD per [facility].  She had been taking the bus to HD previously.   Pain Inventory Average Pain no pain Pain Right Now no pain My pain is no pain  In the last 24 hours, has pain interfered with the following? General activity no pain Relation with others no pain Enjoyment of life no pain What TIME of day is your pain at its worst? no pain Sleep (in general) Good  Pain is worse with: no pain Pain improves with: no pain Relief from Meds: no pain  Mobility walk without assistance how many minutes can you walk? 15 ability to climb steps?  yes do you drive?  no  Function disabled: date disabled 2004 I need assistance with the following:  transportation  Neuro/Psych No problems in this area  Prior Studies Any changes since last visit?  no  Physicians involved in your care Any changes since last visit?  no   Family History  Problem Relation Age of Onset  . Heart disease Father   . Hypertension Mother   . Dementia Mother   . Coronary artery disease Sister    History   Social History  . Marital Status: Legally Separated    Spouse Name: N/A    Number of Children: 2  . Years of Education: N/A   Occupational History  . Retired Engineer, civil (consulting)    Social History Main Topics  . Smoking status: Current Everyday Smoker -- 2.0 packs/day for 53 years    Types: Cigarettes  . Smokeless tobacco: Never Used   Comment: 1/2 ppd since age 58. has stopped since hospitalization but  wants to go back  . Alcohol Use: No  . Drug Use: No  . Sexually Active: Not Currently    Birth Control/ Protection: Post-menopausal   Other Topics Concern  . None   Social History Narrative  . None   Past Surgical History  Procedure Date  . Abdominal hysterectomy   . Cholecystectomy   . D&cs   . Right ankle repair   . Left nephrectomy   . Vascular surgery 11/2010    graft inserted to left arm   Past Medical History  Diagnosis Date  . CAD (coronary artery disease)     stent to RCA  . CVA (cerebral infarction) 2003    no apparent residual  . Renal failure     related to hypertension  . Hypothyroidism   . PVD (peripheral vascular disease)   . Hyperlipidemia   . Positive PPD     completed rifampin  . Diastolic congestive heart failure   . Renal insufficiency   . Hypertension   . COPD (chronic obstructive pulmonary disease) t  . Cancer     clear cell cancer, kidney  . Complication of anesthesia 12/2010    pt is very confused, with AMS with anesthesia   BP 149/69  Pulse 57  Resp 16  Ht 5\' 2"  (  1.575 m)  Wt 148 lb 9.6 oz (67.405 kg)  BMI 27.18 kg/m2  SpO2 98%   Review of Systems  Respiratory: Positive for shortness of breath.   All other systems reviewed and are negative.       Objective:   Physical Exam  Constitutional: She is oriented to person, place, and time. She appears well-developed and well-nourished.  HENT:  Head: Normocephalic and atraumatic.  Right Ear: External ear normal.  Left Ear: External ear normal.  Mouth/Throat: Oropharynx is clear and moist.  Eyes: EOM are normal. Pupils are equal, round, and reactive to light.  Neck: Normal range of motion.  Cardiovascular: Normal rate.   Pulmonary/Chest: Effort normal.  Neurological: She is alert and oriented to person, place, and time. She has normal strength. No sensory deficit.       Minimal balance deficits. Walks without issue.   She remembered 2/3 words after 5 minutes.   Skin: Skin is  warm.  Psychiatric: She has a normal mood and affect. Her behavior is normal. Judgment and thought content normal.          Assessment & Plan:  1. Deconditioning after seizure activity complicated by  cardiorespiratory arrest. Mild anoxic encephalopathy 2. End-stage renal disease.  3. Anemia of chronic disease.  4. Coronary artery disease with NICM.  She is doing extremely well at this point. She has been discharged from therapy. I believe she can be alone by herself. I would recommend periodic check ins just for safety.

## 2011-06-20 NOTE — Patient Instructions (Signed)
[name] may live on her own as long as a family or friend can periodically check in on her.

## 2011-06-21 DIAGNOSIS — D631 Anemia in chronic kidney disease: Secondary | ICD-10-CM | POA: Diagnosis not present

## 2011-06-21 DIAGNOSIS — N2581 Secondary hyperparathyroidism of renal origin: Secondary | ICD-10-CM | POA: Diagnosis not present

## 2011-06-21 DIAGNOSIS — N390 Urinary tract infection, site not specified: Secondary | ICD-10-CM | POA: Diagnosis not present

## 2011-06-21 DIAGNOSIS — D509 Iron deficiency anemia, unspecified: Secondary | ICD-10-CM | POA: Diagnosis not present

## 2011-06-21 DIAGNOSIS — N186 End stage renal disease: Secondary | ICD-10-CM | POA: Diagnosis not present

## 2011-06-23 ENCOUNTER — Other Ambulatory Visit: Payer: Self-pay | Admitting: Interventional Radiology

## 2011-06-23 DIAGNOSIS — N039 Chronic nephritic syndrome with unspecified morphologic changes: Secondary | ICD-10-CM | POA: Diagnosis not present

## 2011-06-23 DIAGNOSIS — N186 End stage renal disease: Secondary | ICD-10-CM | POA: Diagnosis not present

## 2011-06-23 DIAGNOSIS — N2889 Other specified disorders of kidney and ureter: Secondary | ICD-10-CM

## 2011-06-23 DIAGNOSIS — N2581 Secondary hyperparathyroidism of renal origin: Secondary | ICD-10-CM | POA: Diagnosis not present

## 2011-06-23 DIAGNOSIS — N390 Urinary tract infection, site not specified: Secondary | ICD-10-CM | POA: Diagnosis not present

## 2011-06-23 DIAGNOSIS — D509 Iron deficiency anemia, unspecified: Secondary | ICD-10-CM | POA: Diagnosis not present

## 2011-06-26 DIAGNOSIS — N186 End stage renal disease: Secondary | ICD-10-CM | POA: Diagnosis not present

## 2011-06-26 DIAGNOSIS — N2581 Secondary hyperparathyroidism of renal origin: Secondary | ICD-10-CM | POA: Diagnosis not present

## 2011-06-26 DIAGNOSIS — N390 Urinary tract infection, site not specified: Secondary | ICD-10-CM | POA: Diagnosis not present

## 2011-06-26 DIAGNOSIS — N039 Chronic nephritic syndrome with unspecified morphologic changes: Secondary | ICD-10-CM | POA: Diagnosis not present

## 2011-06-26 DIAGNOSIS — D509 Iron deficiency anemia, unspecified: Secondary | ICD-10-CM | POA: Diagnosis not present

## 2011-06-28 DIAGNOSIS — D631 Anemia in chronic kidney disease: Secondary | ICD-10-CM | POA: Diagnosis not present

## 2011-06-28 DIAGNOSIS — N186 End stage renal disease: Secondary | ICD-10-CM | POA: Diagnosis not present

## 2011-06-28 DIAGNOSIS — N390 Urinary tract infection, site not specified: Secondary | ICD-10-CM | POA: Diagnosis not present

## 2011-06-28 DIAGNOSIS — D509 Iron deficiency anemia, unspecified: Secondary | ICD-10-CM | POA: Diagnosis not present

## 2011-06-28 DIAGNOSIS — N2581 Secondary hyperparathyroidism of renal origin: Secondary | ICD-10-CM | POA: Diagnosis not present

## 2011-06-30 DIAGNOSIS — N2581 Secondary hyperparathyroidism of renal origin: Secondary | ICD-10-CM | POA: Diagnosis not present

## 2011-06-30 DIAGNOSIS — N390 Urinary tract infection, site not specified: Secondary | ICD-10-CM | POA: Diagnosis not present

## 2011-06-30 DIAGNOSIS — N186 End stage renal disease: Secondary | ICD-10-CM | POA: Diagnosis not present

## 2011-06-30 DIAGNOSIS — D631 Anemia in chronic kidney disease: Secondary | ICD-10-CM | POA: Diagnosis not present

## 2011-06-30 DIAGNOSIS — D509 Iron deficiency anemia, unspecified: Secondary | ICD-10-CM | POA: Diagnosis not present

## 2011-07-02 DIAGNOSIS — N186 End stage renal disease: Secondary | ICD-10-CM | POA: Diagnosis not present

## 2011-07-03 DIAGNOSIS — D509 Iron deficiency anemia, unspecified: Secondary | ICD-10-CM | POA: Diagnosis not present

## 2011-07-03 DIAGNOSIS — N186 End stage renal disease: Secondary | ICD-10-CM | POA: Diagnosis not present

## 2011-07-03 DIAGNOSIS — D631 Anemia in chronic kidney disease: Secondary | ICD-10-CM | POA: Diagnosis not present

## 2011-07-03 DIAGNOSIS — E8779 Other fluid overload: Secondary | ICD-10-CM | POA: Diagnosis not present

## 2011-07-03 DIAGNOSIS — N039 Chronic nephritic syndrome with unspecified morphologic changes: Secondary | ICD-10-CM | POA: Diagnosis not present

## 2011-07-03 DIAGNOSIS — N2581 Secondary hyperparathyroidism of renal origin: Secondary | ICD-10-CM | POA: Diagnosis not present

## 2011-07-05 DIAGNOSIS — D509 Iron deficiency anemia, unspecified: Secondary | ICD-10-CM | POA: Diagnosis not present

## 2011-07-05 DIAGNOSIS — E8779 Other fluid overload: Secondary | ICD-10-CM | POA: Diagnosis not present

## 2011-07-05 DIAGNOSIS — N186 End stage renal disease: Secondary | ICD-10-CM | POA: Diagnosis not present

## 2011-07-05 DIAGNOSIS — N2581 Secondary hyperparathyroidism of renal origin: Secondary | ICD-10-CM | POA: Diagnosis not present

## 2011-07-05 DIAGNOSIS — D631 Anemia in chronic kidney disease: Secondary | ICD-10-CM | POA: Diagnosis not present

## 2011-07-07 DIAGNOSIS — N186 End stage renal disease: Secondary | ICD-10-CM | POA: Diagnosis not present

## 2011-07-07 DIAGNOSIS — N2581 Secondary hyperparathyroidism of renal origin: Secondary | ICD-10-CM | POA: Diagnosis not present

## 2011-07-07 DIAGNOSIS — E8779 Other fluid overload: Secondary | ICD-10-CM | POA: Diagnosis not present

## 2011-07-07 DIAGNOSIS — D509 Iron deficiency anemia, unspecified: Secondary | ICD-10-CM | POA: Diagnosis not present

## 2011-07-07 DIAGNOSIS — D631 Anemia in chronic kidney disease: Secondary | ICD-10-CM | POA: Diagnosis not present

## 2011-07-10 ENCOUNTER — Telehealth: Payer: Self-pay | Admitting: *Deleted

## 2011-07-10 DIAGNOSIS — E8779 Other fluid overload: Secondary | ICD-10-CM | POA: Diagnosis not present

## 2011-07-10 DIAGNOSIS — N2581 Secondary hyperparathyroidism of renal origin: Secondary | ICD-10-CM | POA: Diagnosis not present

## 2011-07-10 DIAGNOSIS — D509 Iron deficiency anemia, unspecified: Secondary | ICD-10-CM | POA: Diagnosis not present

## 2011-07-10 DIAGNOSIS — N186 End stage renal disease: Secondary | ICD-10-CM | POA: Diagnosis not present

## 2011-07-10 DIAGNOSIS — D631 Anemia in chronic kidney disease: Secondary | ICD-10-CM | POA: Diagnosis not present

## 2011-07-10 NOTE — Telephone Encounter (Signed)
Patient called today reporting pain  At HeRO graft site. She is not a patient here at [facility], here graft was done elsewhere. I called Heather at [facility] and she said the patient only reports pain when they stick her and that they had to try 3 times today to access her for HD.  She has had no drainage, no fever or redness. Herbert Seta will talk to her more tomorrow at HD and will call us back if our services are needed.

## 2011-07-11 DIAGNOSIS — E8779 Other fluid overload: Secondary | ICD-10-CM | POA: Diagnosis not present

## 2011-07-11 DIAGNOSIS — D509 Iron deficiency anemia, unspecified: Secondary | ICD-10-CM | POA: Diagnosis not present

## 2011-07-11 DIAGNOSIS — N186 End stage renal disease: Secondary | ICD-10-CM | POA: Diagnosis not present

## 2011-07-11 DIAGNOSIS — N2581 Secondary hyperparathyroidism of renal origin: Secondary | ICD-10-CM | POA: Diagnosis not present

## 2011-07-11 DIAGNOSIS — D631 Anemia in chronic kidney disease: Secondary | ICD-10-CM | POA: Diagnosis not present

## 2011-07-12 DIAGNOSIS — N2581 Secondary hyperparathyroidism of renal origin: Secondary | ICD-10-CM | POA: Diagnosis not present

## 2011-07-12 DIAGNOSIS — N186 End stage renal disease: Secondary | ICD-10-CM | POA: Diagnosis not present

## 2011-07-12 DIAGNOSIS — E8779 Other fluid overload: Secondary | ICD-10-CM | POA: Diagnosis not present

## 2011-07-12 DIAGNOSIS — D631 Anemia in chronic kidney disease: Secondary | ICD-10-CM | POA: Diagnosis not present

## 2011-07-12 DIAGNOSIS — D509 Iron deficiency anemia, unspecified: Secondary | ICD-10-CM | POA: Diagnosis not present

## 2011-07-14 DIAGNOSIS — N186 End stage renal disease: Secondary | ICD-10-CM | POA: Diagnosis not present

## 2011-07-14 DIAGNOSIS — N2581 Secondary hyperparathyroidism of renal origin: Secondary | ICD-10-CM | POA: Diagnosis not present

## 2011-07-14 DIAGNOSIS — E8779 Other fluid overload: Secondary | ICD-10-CM | POA: Diagnosis not present

## 2011-07-14 DIAGNOSIS — D631 Anemia in chronic kidney disease: Secondary | ICD-10-CM | POA: Diagnosis not present

## 2011-07-14 DIAGNOSIS — D509 Iron deficiency anemia, unspecified: Secondary | ICD-10-CM | POA: Diagnosis not present

## 2011-07-17 DIAGNOSIS — N186 End stage renal disease: Secondary | ICD-10-CM | POA: Diagnosis not present

## 2011-07-17 DIAGNOSIS — N2581 Secondary hyperparathyroidism of renal origin: Secondary | ICD-10-CM | POA: Diagnosis not present

## 2011-07-17 DIAGNOSIS — N039 Chronic nephritic syndrome with unspecified morphologic changes: Secondary | ICD-10-CM | POA: Diagnosis not present

## 2011-07-17 DIAGNOSIS — E8779 Other fluid overload: Secondary | ICD-10-CM | POA: Diagnosis not present

## 2011-07-17 DIAGNOSIS — D509 Iron deficiency anemia, unspecified: Secondary | ICD-10-CM | POA: Diagnosis not present

## 2011-07-19 DIAGNOSIS — E8779 Other fluid overload: Secondary | ICD-10-CM | POA: Diagnosis not present

## 2011-07-19 DIAGNOSIS — N186 End stage renal disease: Secondary | ICD-10-CM | POA: Diagnosis not present

## 2011-07-19 DIAGNOSIS — D509 Iron deficiency anemia, unspecified: Secondary | ICD-10-CM | POA: Diagnosis not present

## 2011-07-19 DIAGNOSIS — N2581 Secondary hyperparathyroidism of renal origin: Secondary | ICD-10-CM | POA: Diagnosis not present

## 2011-07-19 DIAGNOSIS — D631 Anemia in chronic kidney disease: Secondary | ICD-10-CM | POA: Diagnosis not present

## 2011-07-21 DIAGNOSIS — N186 End stage renal disease: Secondary | ICD-10-CM | POA: Diagnosis not present

## 2011-07-21 DIAGNOSIS — N039 Chronic nephritic syndrome with unspecified morphologic changes: Secondary | ICD-10-CM | POA: Diagnosis not present

## 2011-07-21 DIAGNOSIS — E8779 Other fluid overload: Secondary | ICD-10-CM | POA: Diagnosis not present

## 2011-07-21 DIAGNOSIS — N2581 Secondary hyperparathyroidism of renal origin: Secondary | ICD-10-CM | POA: Diagnosis not present

## 2011-07-21 DIAGNOSIS — D509 Iron deficiency anemia, unspecified: Secondary | ICD-10-CM | POA: Diagnosis not present

## 2011-07-24 DIAGNOSIS — N186 End stage renal disease: Secondary | ICD-10-CM | POA: Diagnosis not present

## 2011-07-24 DIAGNOSIS — N2581 Secondary hyperparathyroidism of renal origin: Secondary | ICD-10-CM | POA: Diagnosis not present

## 2011-07-24 DIAGNOSIS — D509 Iron deficiency anemia, unspecified: Secondary | ICD-10-CM | POA: Diagnosis not present

## 2011-07-24 DIAGNOSIS — N039 Chronic nephritic syndrome with unspecified morphologic changes: Secondary | ICD-10-CM | POA: Diagnosis not present

## 2011-07-24 DIAGNOSIS — E8779 Other fluid overload: Secondary | ICD-10-CM | POA: Diagnosis not present

## 2011-07-26 DIAGNOSIS — N186 End stage renal disease: Secondary | ICD-10-CM | POA: Diagnosis not present

## 2011-07-26 DIAGNOSIS — E1129 Type 2 diabetes mellitus with other diabetic kidney complication: Secondary | ICD-10-CM | POA: Diagnosis not present

## 2011-07-26 DIAGNOSIS — N2581 Secondary hyperparathyroidism of renal origin: Secondary | ICD-10-CM | POA: Diagnosis not present

## 2011-07-26 DIAGNOSIS — D631 Anemia in chronic kidney disease: Secondary | ICD-10-CM | POA: Diagnosis not present

## 2011-07-26 DIAGNOSIS — E8779 Other fluid overload: Secondary | ICD-10-CM | POA: Diagnosis not present

## 2011-07-26 DIAGNOSIS — D509 Iron deficiency anemia, unspecified: Secondary | ICD-10-CM | POA: Diagnosis not present

## 2011-07-28 DIAGNOSIS — N2581 Secondary hyperparathyroidism of renal origin: Secondary | ICD-10-CM | POA: Diagnosis not present

## 2011-07-28 DIAGNOSIS — E8779 Other fluid overload: Secondary | ICD-10-CM | POA: Diagnosis not present

## 2011-07-28 DIAGNOSIS — N039 Chronic nephritic syndrome with unspecified morphologic changes: Secondary | ICD-10-CM | POA: Diagnosis not present

## 2011-07-28 DIAGNOSIS — D509 Iron deficiency anemia, unspecified: Secondary | ICD-10-CM | POA: Diagnosis not present

## 2011-07-28 DIAGNOSIS — N186 End stage renal disease: Secondary | ICD-10-CM | POA: Diagnosis not present

## 2011-07-31 DIAGNOSIS — D631 Anemia in chronic kidney disease: Secondary | ICD-10-CM | POA: Diagnosis not present

## 2011-07-31 DIAGNOSIS — N2581 Secondary hyperparathyroidism of renal origin: Secondary | ICD-10-CM | POA: Diagnosis not present

## 2011-07-31 DIAGNOSIS — N186 End stage renal disease: Secondary | ICD-10-CM | POA: Diagnosis not present

## 2011-07-31 DIAGNOSIS — D509 Iron deficiency anemia, unspecified: Secondary | ICD-10-CM | POA: Diagnosis not present

## 2011-07-31 DIAGNOSIS — E8779 Other fluid overload: Secondary | ICD-10-CM | POA: Diagnosis not present

## 2011-08-02 DIAGNOSIS — N186 End stage renal disease: Secondary | ICD-10-CM | POA: Diagnosis not present

## 2011-08-02 DIAGNOSIS — D631 Anemia in chronic kidney disease: Secondary | ICD-10-CM | POA: Diagnosis not present

## 2011-08-02 DIAGNOSIS — D509 Iron deficiency anemia, unspecified: Secondary | ICD-10-CM | POA: Diagnosis not present

## 2011-08-02 DIAGNOSIS — N2581 Secondary hyperparathyroidism of renal origin: Secondary | ICD-10-CM | POA: Diagnosis not present

## 2011-08-02 DIAGNOSIS — E8779 Other fluid overload: Secondary | ICD-10-CM | POA: Diagnosis not present

## 2011-08-04 DIAGNOSIS — N039 Chronic nephritic syndrome with unspecified morphologic changes: Secondary | ICD-10-CM | POA: Diagnosis not present

## 2011-08-04 DIAGNOSIS — E119 Type 2 diabetes mellitus without complications: Secondary | ICD-10-CM | POA: Diagnosis not present

## 2011-08-04 DIAGNOSIS — N2581 Secondary hyperparathyroidism of renal origin: Secondary | ICD-10-CM | POA: Diagnosis not present

## 2011-08-04 DIAGNOSIS — D509 Iron deficiency anemia, unspecified: Secondary | ICD-10-CM | POA: Diagnosis not present

## 2011-08-04 DIAGNOSIS — Z992 Dependence on renal dialysis: Secondary | ICD-10-CM | POA: Diagnosis not present

## 2011-08-04 DIAGNOSIS — N186 End stage renal disease: Secondary | ICD-10-CM | POA: Diagnosis not present

## 2011-08-07 DIAGNOSIS — E119 Type 2 diabetes mellitus without complications: Secondary | ICD-10-CM | POA: Diagnosis not present

## 2011-08-07 DIAGNOSIS — N2581 Secondary hyperparathyroidism of renal origin: Secondary | ICD-10-CM | POA: Diagnosis not present

## 2011-08-07 DIAGNOSIS — Z992 Dependence on renal dialysis: Secondary | ICD-10-CM | POA: Diagnosis not present

## 2011-08-07 DIAGNOSIS — N039 Chronic nephritic syndrome with unspecified morphologic changes: Secondary | ICD-10-CM | POA: Diagnosis not present

## 2011-08-07 DIAGNOSIS — D509 Iron deficiency anemia, unspecified: Secondary | ICD-10-CM | POA: Diagnosis not present

## 2011-08-07 DIAGNOSIS — N186 End stage renal disease: Secondary | ICD-10-CM | POA: Diagnosis not present

## 2011-08-09 DIAGNOSIS — D509 Iron deficiency anemia, unspecified: Secondary | ICD-10-CM | POA: Diagnosis not present

## 2011-08-09 DIAGNOSIS — N186 End stage renal disease: Secondary | ICD-10-CM | POA: Diagnosis not present

## 2011-08-11 DIAGNOSIS — D509 Iron deficiency anemia, unspecified: Secondary | ICD-10-CM | POA: Diagnosis not present

## 2011-08-11 DIAGNOSIS — N186 End stage renal disease: Secondary | ICD-10-CM | POA: Diagnosis not present

## 2011-08-14 DIAGNOSIS — D509 Iron deficiency anemia, unspecified: Secondary | ICD-10-CM | POA: Diagnosis not present

## 2011-08-14 DIAGNOSIS — N2581 Secondary hyperparathyroidism of renal origin: Secondary | ICD-10-CM | POA: Diagnosis not present

## 2011-08-14 DIAGNOSIS — Z992 Dependence on renal dialysis: Secondary | ICD-10-CM | POA: Diagnosis not present

## 2011-08-14 DIAGNOSIS — N186 End stage renal disease: Secondary | ICD-10-CM | POA: Diagnosis not present

## 2011-08-14 DIAGNOSIS — E119 Type 2 diabetes mellitus without complications: Secondary | ICD-10-CM | POA: Diagnosis not present

## 2011-08-14 DIAGNOSIS — D631 Anemia in chronic kidney disease: Secondary | ICD-10-CM | POA: Diagnosis not present

## 2011-08-15 ENCOUNTER — Ambulatory Visit (HOSPITAL_COMMUNITY)
Admission: RE | Admit: 2011-08-15 | Discharge: 2011-08-15 | Disposition: A | Discharge status: Home / Self Care | Payer: Medicare Other | Source: Ambulatory Visit | Attending: Interventional Radiology | Admitting: Interventional Radiology

## 2011-08-15 ENCOUNTER — Other Ambulatory Visit: Payer: Self-pay | Admitting: Emergency Medicine

## 2011-08-15 DIAGNOSIS — N2889 Other specified disorders of kidney and ureter: Secondary | ICD-10-CM

## 2011-08-15 DIAGNOSIS — R609 Edema, unspecified: Secondary | ICD-10-CM

## 2011-08-15 MED ORDER — PREDNISONE 50 MG PO TABS: 50.0000 mg | ORAL_TABLET | ORAL | Status: AC

## 2011-08-15 MED ORDER — DIPHENHYDRAMINE HCL 25 MG PO TABS: 50.0000 mg | ORAL_TABLET | ORAL | Status: DC

## 2011-08-16 ENCOUNTER — Other Ambulatory Visit: Payer: Medicare Other

## 2011-08-16 DIAGNOSIS — N2581 Secondary hyperparathyroidism of renal origin: Secondary | ICD-10-CM | POA: Diagnosis not present

## 2011-08-16 DIAGNOSIS — N186 End stage renal disease: Secondary | ICD-10-CM | POA: Diagnosis not present

## 2011-08-16 DIAGNOSIS — Z992 Dependence on renal dialysis: Secondary | ICD-10-CM | POA: Diagnosis not present

## 2011-08-16 DIAGNOSIS — E119 Type 2 diabetes mellitus without complications: Secondary | ICD-10-CM | POA: Diagnosis not present

## 2011-08-16 DIAGNOSIS — N039 Chronic nephritic syndrome with unspecified morphologic changes: Secondary | ICD-10-CM | POA: Diagnosis not present

## 2011-08-16 DIAGNOSIS — D509 Iron deficiency anemia, unspecified: Secondary | ICD-10-CM | POA: Diagnosis not present

## 2011-08-18 DIAGNOSIS — N2581 Secondary hyperparathyroidism of renal origin: Secondary | ICD-10-CM | POA: Diagnosis not present

## 2011-08-18 DIAGNOSIS — D509 Iron deficiency anemia, unspecified: Secondary | ICD-10-CM | POA: Diagnosis not present

## 2011-08-18 DIAGNOSIS — E119 Type 2 diabetes mellitus without complications: Secondary | ICD-10-CM | POA: Diagnosis not present

## 2011-08-18 DIAGNOSIS — N039 Chronic nephritic syndrome with unspecified morphologic changes: Secondary | ICD-10-CM | POA: Diagnosis not present

## 2011-08-18 DIAGNOSIS — N186 End stage renal disease: Secondary | ICD-10-CM | POA: Diagnosis not present

## 2011-08-18 DIAGNOSIS — Z992 Dependence on renal dialysis: Secondary | ICD-10-CM | POA: Diagnosis not present

## 2011-08-21 DIAGNOSIS — E119 Type 2 diabetes mellitus without complications: Secondary | ICD-10-CM | POA: Diagnosis not present

## 2011-08-21 DIAGNOSIS — N186 End stage renal disease: Secondary | ICD-10-CM | POA: Diagnosis not present

## 2011-08-21 DIAGNOSIS — Z992 Dependence on renal dialysis: Secondary | ICD-10-CM | POA: Diagnosis not present

## 2011-08-21 DIAGNOSIS — D509 Iron deficiency anemia, unspecified: Secondary | ICD-10-CM | POA: Diagnosis not present

## 2011-08-21 DIAGNOSIS — D631 Anemia in chronic kidney disease: Secondary | ICD-10-CM | POA: Diagnosis not present

## 2011-08-21 DIAGNOSIS — N2581 Secondary hyperparathyroidism of renal origin: Secondary | ICD-10-CM | POA: Diagnosis not present

## 2011-08-23 DIAGNOSIS — D509 Iron deficiency anemia, unspecified: Secondary | ICD-10-CM | POA: Diagnosis not present

## 2011-08-23 DIAGNOSIS — Z992 Dependence on renal dialysis: Secondary | ICD-10-CM | POA: Diagnosis not present

## 2011-08-23 DIAGNOSIS — E119 Type 2 diabetes mellitus without complications: Secondary | ICD-10-CM | POA: Diagnosis not present

## 2011-08-23 DIAGNOSIS — N039 Chronic nephritic syndrome with unspecified morphologic changes: Secondary | ICD-10-CM | POA: Diagnosis not present

## 2011-08-23 DIAGNOSIS — N2581 Secondary hyperparathyroidism of renal origin: Secondary | ICD-10-CM | POA: Diagnosis not present

## 2011-08-23 DIAGNOSIS — N186 End stage renal disease: Secondary | ICD-10-CM | POA: Diagnosis not present

## 2011-08-25 DIAGNOSIS — Z992 Dependence on renal dialysis: Secondary | ICD-10-CM | POA: Diagnosis not present

## 2011-08-25 DIAGNOSIS — E119 Type 2 diabetes mellitus without complications: Secondary | ICD-10-CM | POA: Diagnosis not present

## 2011-08-25 DIAGNOSIS — N186 End stage renal disease: Secondary | ICD-10-CM | POA: Diagnosis not present

## 2011-08-25 DIAGNOSIS — N2581 Secondary hyperparathyroidism of renal origin: Secondary | ICD-10-CM | POA: Diagnosis not present

## 2011-08-25 DIAGNOSIS — D509 Iron deficiency anemia, unspecified: Secondary | ICD-10-CM | POA: Diagnosis not present

## 2011-08-25 DIAGNOSIS — D631 Anemia in chronic kidney disease: Secondary | ICD-10-CM | POA: Diagnosis not present

## 2011-08-28 DIAGNOSIS — D509 Iron deficiency anemia, unspecified: Secondary | ICD-10-CM | POA: Diagnosis not present

## 2011-08-28 DIAGNOSIS — N2581 Secondary hyperparathyroidism of renal origin: Secondary | ICD-10-CM | POA: Diagnosis not present

## 2011-08-28 DIAGNOSIS — N186 End stage renal disease: Secondary | ICD-10-CM | POA: Diagnosis not present

## 2011-08-28 DIAGNOSIS — D631 Anemia in chronic kidney disease: Secondary | ICD-10-CM | POA: Diagnosis not present

## 2011-08-28 DIAGNOSIS — Z992 Dependence on renal dialysis: Secondary | ICD-10-CM | POA: Diagnosis not present

## 2011-08-28 DIAGNOSIS — E119 Type 2 diabetes mellitus without complications: Secondary | ICD-10-CM | POA: Diagnosis not present

## 2011-08-29 ENCOUNTER — Ambulatory Visit
Admission: RE | Admit: 2011-08-29 | Discharge: 2011-08-29 | Disposition: A | Discharge status: Home / Self Care | Payer: Medicare Other | Source: Ambulatory Visit | Attending: Interventional Radiology | Admitting: Interventional Radiology

## 2011-08-29 ENCOUNTER — Ambulatory Visit (HOSPITAL_COMMUNITY)
Admission: RE | Admit: 2011-08-29 | Discharge: 2011-08-29 | Disposition: A | Discharge status: Home / Self Care | Payer: Medicare Other | Source: Ambulatory Visit | Attending: Interventional Radiology | Admitting: Interventional Radiology

## 2011-08-29 DIAGNOSIS — Q619 Cystic kidney disease, unspecified: Secondary | ICD-10-CM | POA: Diagnosis not present

## 2011-08-29 DIAGNOSIS — N2889 Other specified disorders of kidney and ureter: Secondary | ICD-10-CM

## 2011-08-29 DIAGNOSIS — N289 Disorder of kidney and ureter, unspecified: Secondary | ICD-10-CM | POA: Diagnosis not present

## 2011-08-29 DIAGNOSIS — N281 Cyst of kidney, acquired: Secondary | ICD-10-CM | POA: Diagnosis not present

## 2011-08-29 MED ORDER — IOHEXOL 300 MG/ML  SOLN: 100.0000 mL | Freq: Once | INTRAMUSCULAR | Status: DC | PRN

## 2011-08-30 NOTE — Progress Notes (Signed)
Patient reports no hematuria or any other urinary problems.    Patient reports that she was hospitalized in April for seizure disorder & cardiorespiratory arrest.  In rehab center after discharge.  Currently living alone in her home but plans to move in with her daughter within the next couple of months due to financial needs.

## 2011-08-30 NOTE — Progress Notes (Signed)
Patient ID: [name], female   DOB: 1945-06-25, 66 y.o.   MRN: [id number]  ESTABLISHED PATIENT OFFICE VISIT  Chief Complaint: Follow-up of complex cystic lesions of the right kidney.  History: Mrs. Paz returns for followup and was last seen on 03/07/2011. Clinically, she is doing much better since prior hospitalization for altered mental status and seizure with cardiorespiratory arrest. She was hospitalized in May of this year. Since that time, she has had no additional seizures and is back to her baseline. She has lost approximately 70 pounds in weight over the last several months and feels much better.  Review of Systems: No flank pain, hematuria or dysuria. The patient remains on hemodialysis and complains of pain at the site of her left upper arm HeRO graft when it is used.  Exam: Vital signs: Blood pressure 198/81, pulse 77, respirations 16, temperature 98.2, oxygen saturation 99% on room air. General: Alert and oriented. Abdomen: Soft and nontender. No flank tenderness.  Imaging: Follow-up CT of the abdomen was performed with and without contrast earlier today. CT followup was chosen as the patient had an extremely difficult time with MRI previously which was compromised by motion artifact due to claustrophobia. The patient prefers not to have future MRI studies. Current CT is of very good quality and compared to a previous CT on 01/26/2011. I personally reviewed the studies and may separate measurements. Imaging findings are actually quite complex and show several complex cystic lesions of the solitary right kidney. A superior upper pole cystic lesion contains an area of internal mural enhancement and is stable to slightly larger in size, measuring 1.7 x 2.2 cm. A complex solid versus cystic lesion of the posterior interpolar kidney is possibly slightly larger and measures approximately 2 x 2.3 cm. A lower pole complex cyst has possibly increased slightly in size  from 2 cm to 2.2 cm in greatest diameter.  Assessment and Plan: I met with Mrs. Sorenson and reviewed imaging findings with her. The lesions present in the right kidney are more complex than previously depicted and any of three different lesions of the right kidney could potentially represent slow growing malignancies or oncocytic neoplasms. I do not feel that there is a clear lesion that stands out has being much more suspicious than another to warrant percutaneous cryoablation at this time. I told her that I also do not think that current biopsy would be very helpful given multiple complex lesions present. Given that she has been on long-term dialysis, she may ultimately require right nephrectomy. She currently is resistant to undergoing major surgery given her recent hospitalizations and problems which she has now recovered from. I did recommend that we at least obtain another follow-up CT in 6 months. In the meantime, I will also ask Dr. Vernie Ammons for a Urologic opinion given the complex appearance by current CT suggesting interval enlargement of up to 3 complex lesions in the right kidney.

## 2011-08-31 DIAGNOSIS — D509 Iron deficiency anemia, unspecified: Secondary | ICD-10-CM | POA: Diagnosis not present

## 2011-08-31 DIAGNOSIS — N186 End stage renal disease: Secondary | ICD-10-CM | POA: Diagnosis not present

## 2011-08-31 DIAGNOSIS — Z992 Dependence on renal dialysis: Secondary | ICD-10-CM | POA: Diagnosis not present

## 2011-08-31 DIAGNOSIS — N2581 Secondary hyperparathyroidism of renal origin: Secondary | ICD-10-CM | POA: Diagnosis not present

## 2011-08-31 DIAGNOSIS — E119 Type 2 diabetes mellitus without complications: Secondary | ICD-10-CM | POA: Diagnosis not present

## 2011-08-31 DIAGNOSIS — N039 Chronic nephritic syndrome with unspecified morphologic changes: Secondary | ICD-10-CM | POA: Diagnosis not present

## 2011-09-01 DIAGNOSIS — D509 Iron deficiency anemia, unspecified: Secondary | ICD-10-CM | POA: Diagnosis not present

## 2011-09-01 DIAGNOSIS — E119 Type 2 diabetes mellitus without complications: Secondary | ICD-10-CM | POA: Diagnosis not present

## 2011-09-01 DIAGNOSIS — N2581 Secondary hyperparathyroidism of renal origin: Secondary | ICD-10-CM | POA: Diagnosis not present

## 2011-09-01 DIAGNOSIS — N186 End stage renal disease: Secondary | ICD-10-CM | POA: Diagnosis not present

## 2011-09-01 DIAGNOSIS — D631 Anemia in chronic kidney disease: Secondary | ICD-10-CM | POA: Diagnosis not present

## 2011-09-01 DIAGNOSIS — Z992 Dependence on renal dialysis: Secondary | ICD-10-CM | POA: Diagnosis not present

## 2011-09-02 DIAGNOSIS — N186 End stage renal disease: Secondary | ICD-10-CM | POA: Diagnosis not present

## 2011-09-04 DIAGNOSIS — N2581 Secondary hyperparathyroidism of renal origin: Secondary | ICD-10-CM | POA: Diagnosis not present

## 2011-09-04 DIAGNOSIS — E878 Other disorders of electrolyte and fluid balance, not elsewhere classified: Secondary | ICD-10-CM | POA: Diagnosis not present

## 2011-09-04 DIAGNOSIS — D509 Iron deficiency anemia, unspecified: Secondary | ICD-10-CM | POA: Diagnosis not present

## 2011-09-04 DIAGNOSIS — N186 End stage renal disease: Secondary | ICD-10-CM | POA: Diagnosis not present

## 2011-09-04 DIAGNOSIS — D631 Anemia in chronic kidney disease: Secondary | ICD-10-CM | POA: Diagnosis not present

## 2011-09-04 DIAGNOSIS — E039 Hypothyroidism, unspecified: Secondary | ICD-10-CM | POA: Diagnosis not present

## 2011-09-04 DIAGNOSIS — Z23 Encounter for immunization: Secondary | ICD-10-CM | POA: Diagnosis not present

## 2011-09-04 DIAGNOSIS — E119 Type 2 diabetes mellitus without complications: Secondary | ICD-10-CM | POA: Diagnosis not present

## 2011-09-12 DIAGNOSIS — E7800 Pure hypercholesterolemia, unspecified: Secondary | ICD-10-CM | POA: Diagnosis not present

## 2011-09-12 DIAGNOSIS — N189 Chronic kidney disease, unspecified: Secondary | ICD-10-CM | POA: Diagnosis not present

## 2011-09-12 DIAGNOSIS — I10 Essential (primary) hypertension: Secondary | ICD-10-CM | POA: Diagnosis not present

## 2011-09-12 DIAGNOSIS — A598 Trichomoniasis of other sites: Secondary | ICD-10-CM | POA: Diagnosis not present

## 2011-09-14 DIAGNOSIS — G40309 Generalized idiopathic epilepsy and epileptic syndromes, not intractable, without status epilepticus: Secondary | ICD-10-CM | POA: Diagnosis not present

## 2011-09-19 ENCOUNTER — Encounter: Payer: Self-pay | Admitting: Cardiology

## 2011-09-19 ENCOUNTER — Ambulatory Visit (INDEPENDENT_AMBULATORY_CARE_PROVIDER_SITE_OTHER): Payer: Medicare Other | Admitting: Cardiology

## 2011-09-19 VITALS — BP 146/63 | HR 56 | Ht 62.0 in | Wt 147.8 lb

## 2011-09-19 DIAGNOSIS — I2510 Atherosclerotic heart disease of native coronary artery without angina pectoris: Secondary | ICD-10-CM

## 2011-09-19 DIAGNOSIS — I10 Essential (primary) hypertension: Secondary | ICD-10-CM

## 2011-09-19 NOTE — Progress Notes (Signed)
HPI Patient presents for followup of her nonobsructive coronary disease. Her last catheterization was January 2011. Echo at that suggested perhaps a mildly reduced ejection fraction. However, since that time she's had no new cardiovascular symptoms. Her blood pressure had been very difficult to control but this is managed with medicines as listed below. Her volume has been managed with dialysis. She does have end-stage renal disease. She tolerates dialysis.. She has not get chest pressure, neck or arm discomfort. She doesn't report any shortness of breath, PND or orthopnea. She doesn't have any palpitations, presyncope or syncope. She did have long hospitalizations related to seizures. There were no cardiac issues and I did review these records.  Allergies  Allergen Reactions  . Iohexol Swelling and Other (See Comments)    1970s; passed out and had facial/tongue swelling.  Requires 13-hour prep with prednisone and benadryl  . Ampicillin Hives and Rash  . Meperidine Hcl Swelling and Rash    Makes tongue swell  . Morphine Rash  . Penicillins Rash  . Valproic Acid And Related Other (See Comments)    Confusion   . Heparin     MDs told her not to take after reaction in ICU  . Pentazocine Lactate     Patient does not remember reaction to this med (Talwin).   . Phenytoin Other (See Comments)    Had reaction while in ICU; doesn't know.  MDs told her not to take ever again.  . Wellbutrin (Bupropion)     sizures  . Ace Inhibitors Rash    Current Outpatient Prescriptions  Medication Sig Dispense Refill  . albuterol-ipratropium (COMBIVENT) 18-103 MCG/ACT inhaler Inhale 2 puffs into the lungs every 6 (six) hours as needed. For breathing      . amLODipine (NORVASC) 10 MG tablet Take 10 mg by mouth daily.       [name] aspirin EC 81 MG tablet Take 81 mg by mouth daily.       . carvedilol (COREG) 25 MG tablet Take 25 mg by mouth 2 (two) times daily with a meal.       . cinacalcet (SENSIPAR) 30 MG  tablet Take 30 mg by mouth daily.      . citalopram (CELEXA) 20 MG tablet Take 20 mg by mouth daily.       . diphenhydrAMINE (BENADRYL) 25 MG tablet Take 2 tablets (50 mg total) by mouth 1 day or 1 dose.  2 tablet  0  . Fluticasone-Salmeterol (ADVAIR) 100-50 MCG/DOSE AEPB Inhale 1 puff into the lungs every 12 (twelve) hours.      . hydrALAZINE (APRESOLINE) 50 MG tablet Take 1 tablet (50 mg total) by mouth 4 (four) times daily.  120 tablet  1  . levothyroxine (SYNTHROID, LEVOTHROID) 88 MCG tablet Take 88 mcg by mouth daily.       . multivitamin (RENA-VIT) TABS tablet Take 1 tablet by mouth daily.       [name] NITROSTAT 0.4 MG SL tablet Place 0.4 mg under the tongue every 5 (five) minutes as needed. For chest pain.      . polyethylene glycol powder (GLYCOLAX/MIRALAX) powder Take 17 g by mouth daily as needed. For constipation.      . pravastatin (PRAVACHOL) 40 MG tablet Take 80 mg by mouth at bedtime.       . sevelamer (RENVELA) 800 MG tablet Take 3 tablets (2,400 mg total) by mouth 3 (three) times daily with meals.  270 tablet  1  . thiamine 100 MG tablet  Take 1 tablet (100 mg total) by mouth daily.  30 tablet  1  . traZODone (DESYREL) 50 MG tablet Take 1 tablet (50 mg total) by mouth at bedtime as needed for sleep.  30 tablet  0  . DISCONTD: levothyroxine (SYNTHROID, LEVOTHROID) 50 MCG tablet TAKE 1 TABLET BY MOUTH EVERY DAY  90 tablet  2    Past Medical History  Diagnosis Date  . CAD (coronary artery disease)     stent to RCA  . CVA (cerebral infarction) 2003    no apparent residual  . Renal failure     related to hypertension  . Hypothyroidism   . PVD (peripheral vascular disease)   . Hyperlipidemia   . Positive PPD     completed rifampin  . Diastolic congestive heart failure   . Renal insufficiency   . Hypertension   . COPD (chronic obstructive pulmonary disease) t  . Cancer     clear cell cancer, kidney  . Complication of anesthesia 12/2010    pt is very confused, with AMS with  anesthesia    Past Surgical History  Procedure Date  . Abdominal hysterectomy   . Cholecystectomy   . D&cs   . Right ankle repair   . Left nephrectomy   . Vascular surgery 11/2010    graft inserted to left arm    ROS:  As stated in the HPI and negative for all other systems.  PHYSICAL EXAM BP 146/63  Pulse 56  Ht 5\' 2"  (1.575 m)  Wt 147 lb 12.8 oz (67.042 kg)  BMI 27.03 kg/m2 GENERAL:  Well appearing HEENT:  Pupils equal round and reactive, fundi not visualized, oral mucosa unremarkable NECK:  No jugular venous distention, waveform within normal limits, carotid upstroke brisk and symmetric, transmitted systolic bruit, no thyromegaly LYMPHATICS:  No cervical, inguinal adenopathy LUNGS:  Clear to auscultation bilaterally BACK:  No CVA tenderness CHEST:  Unremarkable HEART:  PMI not displaced or sustained,S1 and S2 within normal limits, no S3, no S4, no clicks, no rubs, apical systolic murmur early peaking and radiating out the outflow tract. ABD:  Flat, positive bowel sounds normal in frequency in pitch, no bruits, no rebound, no guarding, no midline pulsatile mass, no hepatomegaly, no splenomegaly EXT:  2 plus pulses throughout, right arm edema, clotted right arm fistula, thrill bruit left upper arm fistula,  no cyanosis no clubbing   EKG:  Sinus rhythm, rate 56, left axis deviation,  poor anterior R wave progression, no acute ST-T wave changes.  09/19/2011   ASSESSMENT AND PLAN   CAD -  The patient had nonobstructive disease previously. She has no new symptoms. No further cardiovascular testing is suggested.  TOBACCO USER -  She's tried many times to quit smoking and has failed all available therapies.  She continues to be educated.   ESSENTIAL HYPERTENSION, MALIGNANT -  The blood pressure is at target. No change in medications is indicated. We will continue with therapeutic lifestyle changes (TLC).  CHF - The patient has had issues with volume management previously.  Her ejection fraction appears to be mildly reduced from an echo in 2011 and I reviewed this. However, her volume is manage well at dialysis and she has no overt symptoms. She had minimal coronary disease as described. No further cardiovascular testing is suggested. She can come back to see [place] as needed. Otherwise I will defer management of her primary provider and nephrologist.

## 2011-09-27 DIAGNOSIS — N189 Chronic kidney disease, unspecified: Secondary | ICD-10-CM | POA: Diagnosis not present

## 2011-09-27 DIAGNOSIS — E785 Hyperlipidemia, unspecified: Secondary | ICD-10-CM | POA: Diagnosis not present

## 2011-10-02 DIAGNOSIS — N186 End stage renal disease: Secondary | ICD-10-CM | POA: Diagnosis not present

## 2011-10-04 DIAGNOSIS — N039 Chronic nephritic syndrome with unspecified morphologic changes: Secondary | ICD-10-CM | POA: Diagnosis not present

## 2011-10-04 DIAGNOSIS — D509 Iron deficiency anemia, unspecified: Secondary | ICD-10-CM | POA: Diagnosis not present

## 2011-10-04 DIAGNOSIS — N2581 Secondary hyperparathyroidism of renal origin: Secondary | ICD-10-CM | POA: Diagnosis not present

## 2011-10-04 DIAGNOSIS — N186 End stage renal disease: Secondary | ICD-10-CM | POA: Diagnosis not present

## 2011-10-06 DIAGNOSIS — N2581 Secondary hyperparathyroidism of renal origin: Secondary | ICD-10-CM | POA: Diagnosis not present

## 2011-10-06 DIAGNOSIS — D509 Iron deficiency anemia, unspecified: Secondary | ICD-10-CM | POA: Diagnosis not present

## 2011-10-06 DIAGNOSIS — N039 Chronic nephritic syndrome with unspecified morphologic changes: Secondary | ICD-10-CM | POA: Diagnosis not present

## 2011-10-06 DIAGNOSIS — N186 End stage renal disease: Secondary | ICD-10-CM | POA: Diagnosis not present

## 2011-10-09 DIAGNOSIS — D631 Anemia in chronic kidney disease: Secondary | ICD-10-CM | POA: Diagnosis not present

## 2011-10-09 DIAGNOSIS — D509 Iron deficiency anemia, unspecified: Secondary | ICD-10-CM | POA: Diagnosis not present

## 2011-10-09 DIAGNOSIS — N186 End stage renal disease: Secondary | ICD-10-CM | POA: Diagnosis not present

## 2011-10-09 DIAGNOSIS — N2581 Secondary hyperparathyroidism of renal origin: Secondary | ICD-10-CM | POA: Diagnosis not present

## 2011-10-11 DIAGNOSIS — N2581 Secondary hyperparathyroidism of renal origin: Secondary | ICD-10-CM | POA: Diagnosis not present

## 2011-10-11 DIAGNOSIS — D509 Iron deficiency anemia, unspecified: Secondary | ICD-10-CM | POA: Diagnosis not present

## 2011-10-11 DIAGNOSIS — N186 End stage renal disease: Secondary | ICD-10-CM | POA: Diagnosis not present

## 2011-10-11 DIAGNOSIS — N039 Chronic nephritic syndrome with unspecified morphologic changes: Secondary | ICD-10-CM | POA: Diagnosis not present

## 2011-10-13 DIAGNOSIS — N039 Chronic nephritic syndrome with unspecified morphologic changes: Secondary | ICD-10-CM | POA: Diagnosis not present

## 2011-10-13 DIAGNOSIS — N2581 Secondary hyperparathyroidism of renal origin: Secondary | ICD-10-CM | POA: Diagnosis not present

## 2011-10-13 DIAGNOSIS — D509 Iron deficiency anemia, unspecified: Secondary | ICD-10-CM | POA: Diagnosis not present

## 2011-10-13 DIAGNOSIS — N186 End stage renal disease: Secondary | ICD-10-CM | POA: Diagnosis not present

## 2011-10-16 DIAGNOSIS — N2581 Secondary hyperparathyroidism of renal origin: Secondary | ICD-10-CM | POA: Diagnosis not present

## 2011-10-16 DIAGNOSIS — D509 Iron deficiency anemia, unspecified: Secondary | ICD-10-CM | POA: Diagnosis not present

## 2011-10-16 DIAGNOSIS — N186 End stage renal disease: Secondary | ICD-10-CM | POA: Diagnosis not present

## 2011-10-16 DIAGNOSIS — D631 Anemia in chronic kidney disease: Secondary | ICD-10-CM | POA: Diagnosis not present

## 2011-10-18 DIAGNOSIS — D509 Iron deficiency anemia, unspecified: Secondary | ICD-10-CM | POA: Diagnosis not present

## 2011-10-18 DIAGNOSIS — N2581 Secondary hyperparathyroidism of renal origin: Secondary | ICD-10-CM | POA: Diagnosis not present

## 2011-10-18 DIAGNOSIS — N186 End stage renal disease: Secondary | ICD-10-CM | POA: Diagnosis not present

## 2011-10-18 DIAGNOSIS — D631 Anemia in chronic kidney disease: Secondary | ICD-10-CM | POA: Diagnosis not present

## 2011-10-19 DIAGNOSIS — B373 Candidiasis of vulva and vagina: Secondary | ICD-10-CM | POA: Diagnosis not present

## 2011-10-19 DIAGNOSIS — A598 Trichomoniasis of other sites: Secondary | ICD-10-CM | POA: Diagnosis not present

## 2011-10-20 DIAGNOSIS — D509 Iron deficiency anemia, unspecified: Secondary | ICD-10-CM | POA: Diagnosis not present

## 2011-10-20 DIAGNOSIS — N2581 Secondary hyperparathyroidism of renal origin: Secondary | ICD-10-CM | POA: Diagnosis not present

## 2011-10-20 DIAGNOSIS — N186 End stage renal disease: Secondary | ICD-10-CM | POA: Diagnosis not present

## 2011-10-20 DIAGNOSIS — N039 Chronic nephritic syndrome with unspecified morphologic changes: Secondary | ICD-10-CM | POA: Diagnosis not present

## 2011-10-23 DIAGNOSIS — N186 End stage renal disease: Secondary | ICD-10-CM | POA: Diagnosis not present

## 2011-10-23 DIAGNOSIS — D631 Anemia in chronic kidney disease: Secondary | ICD-10-CM | POA: Diagnosis not present

## 2011-10-23 DIAGNOSIS — N2581 Secondary hyperparathyroidism of renal origin: Secondary | ICD-10-CM | POA: Diagnosis not present

## 2011-10-23 DIAGNOSIS — D509 Iron deficiency anemia, unspecified: Secondary | ICD-10-CM | POA: Diagnosis not present

## 2011-10-25 DIAGNOSIS — D509 Iron deficiency anemia, unspecified: Secondary | ICD-10-CM | POA: Diagnosis not present

## 2011-10-25 DIAGNOSIS — E1129 Type 2 diabetes mellitus with other diabetic kidney complication: Secondary | ICD-10-CM | POA: Diagnosis not present

## 2011-10-25 DIAGNOSIS — N039 Chronic nephritic syndrome with unspecified morphologic changes: Secondary | ICD-10-CM | POA: Diagnosis not present

## 2011-10-25 DIAGNOSIS — N186 End stage renal disease: Secondary | ICD-10-CM | POA: Diagnosis not present

## 2011-10-25 DIAGNOSIS — N2581 Secondary hyperparathyroidism of renal origin: Secondary | ICD-10-CM | POA: Diagnosis not present

## 2011-10-25 DIAGNOSIS — N189 Chronic kidney disease, unspecified: Secondary | ICD-10-CM | POA: Diagnosis not present

## 2011-10-27 DIAGNOSIS — D509 Iron deficiency anemia, unspecified: Secondary | ICD-10-CM | POA: Diagnosis not present

## 2011-10-27 DIAGNOSIS — N2581 Secondary hyperparathyroidism of renal origin: Secondary | ICD-10-CM | POA: Diagnosis not present

## 2011-10-27 DIAGNOSIS — N186 End stage renal disease: Secondary | ICD-10-CM | POA: Diagnosis not present

## 2011-10-27 DIAGNOSIS — N039 Chronic nephritic syndrome with unspecified morphologic changes: Secondary | ICD-10-CM | POA: Diagnosis not present

## 2011-10-30 DIAGNOSIS — D631 Anemia in chronic kidney disease: Secondary | ICD-10-CM | POA: Diagnosis not present

## 2011-10-30 DIAGNOSIS — N186 End stage renal disease: Secondary | ICD-10-CM | POA: Diagnosis not present

## 2011-10-30 DIAGNOSIS — N2581 Secondary hyperparathyroidism of renal origin: Secondary | ICD-10-CM | POA: Diagnosis not present

## 2011-10-30 DIAGNOSIS — D509 Iron deficiency anemia, unspecified: Secondary | ICD-10-CM | POA: Diagnosis not present

## 2011-11-01 DIAGNOSIS — D509 Iron deficiency anemia, unspecified: Secondary | ICD-10-CM | POA: Diagnosis not present

## 2011-11-01 DIAGNOSIS — N2581 Secondary hyperparathyroidism of renal origin: Secondary | ICD-10-CM | POA: Diagnosis not present

## 2011-11-01 DIAGNOSIS — N039 Chronic nephritic syndrome with unspecified morphologic changes: Secondary | ICD-10-CM | POA: Diagnosis not present

## 2011-11-01 DIAGNOSIS — N186 End stage renal disease: Secondary | ICD-10-CM | POA: Diagnosis not present

## 2011-11-02 DIAGNOSIS — N186 End stage renal disease: Secondary | ICD-10-CM | POA: Diagnosis not present

## 2011-11-03 DIAGNOSIS — N2581 Secondary hyperparathyroidism of renal origin: Secondary | ICD-10-CM | POA: Diagnosis not present

## 2011-11-03 DIAGNOSIS — N186 End stage renal disease: Secondary | ICD-10-CM | POA: Diagnosis not present

## 2011-11-03 DIAGNOSIS — D509 Iron deficiency anemia, unspecified: Secondary | ICD-10-CM | POA: Diagnosis not present

## 2011-11-03 DIAGNOSIS — Z992 Dependence on renal dialysis: Secondary | ICD-10-CM | POA: Diagnosis not present

## 2011-11-03 DIAGNOSIS — N039 Chronic nephritic syndrome with unspecified morphologic changes: Secondary | ICD-10-CM | POA: Diagnosis not present

## 2011-11-03 DIAGNOSIS — D631 Anemia in chronic kidney disease: Secondary | ICD-10-CM | POA: Diagnosis not present

## 2011-11-06 DIAGNOSIS — D509 Iron deficiency anemia, unspecified: Secondary | ICD-10-CM | POA: Diagnosis not present

## 2011-11-06 DIAGNOSIS — D631 Anemia in chronic kidney disease: Secondary | ICD-10-CM | POA: Diagnosis not present

## 2011-11-06 DIAGNOSIS — N186 End stage renal disease: Secondary | ICD-10-CM | POA: Diagnosis not present

## 2011-11-06 DIAGNOSIS — Z992 Dependence on renal dialysis: Secondary | ICD-10-CM | POA: Diagnosis not present

## 2011-11-06 DIAGNOSIS — N2581 Secondary hyperparathyroidism of renal origin: Secondary | ICD-10-CM | POA: Diagnosis not present

## 2011-11-08 DIAGNOSIS — N2581 Secondary hyperparathyroidism of renal origin: Secondary | ICD-10-CM | POA: Diagnosis not present

## 2011-11-08 DIAGNOSIS — D631 Anemia in chronic kidney disease: Secondary | ICD-10-CM | POA: Diagnosis not present

## 2011-11-08 DIAGNOSIS — N186 End stage renal disease: Secondary | ICD-10-CM | POA: Diagnosis not present

## 2011-11-08 DIAGNOSIS — Z992 Dependence on renal dialysis: Secondary | ICD-10-CM | POA: Diagnosis not present

## 2011-11-08 DIAGNOSIS — D509 Iron deficiency anemia, unspecified: Secondary | ICD-10-CM | POA: Diagnosis not present

## 2011-11-10 DIAGNOSIS — D509 Iron deficiency anemia, unspecified: Secondary | ICD-10-CM | POA: Diagnosis not present

## 2011-11-10 DIAGNOSIS — D631 Anemia in chronic kidney disease: Secondary | ICD-10-CM | POA: Diagnosis not present

## 2011-11-10 DIAGNOSIS — N2581 Secondary hyperparathyroidism of renal origin: Secondary | ICD-10-CM | POA: Diagnosis not present

## 2011-11-10 DIAGNOSIS — Z992 Dependence on renal dialysis: Secondary | ICD-10-CM | POA: Diagnosis not present

## 2011-11-10 DIAGNOSIS — N186 End stage renal disease: Secondary | ICD-10-CM | POA: Diagnosis not present

## 2011-11-13 DIAGNOSIS — N2581 Secondary hyperparathyroidism of renal origin: Secondary | ICD-10-CM | POA: Diagnosis not present

## 2011-11-13 DIAGNOSIS — N186 End stage renal disease: Secondary | ICD-10-CM | POA: Diagnosis not present

## 2011-11-13 DIAGNOSIS — Z992 Dependence on renal dialysis: Secondary | ICD-10-CM | POA: Diagnosis not present

## 2011-11-13 DIAGNOSIS — D509 Iron deficiency anemia, unspecified: Secondary | ICD-10-CM | POA: Diagnosis not present

## 2011-11-13 DIAGNOSIS — D631 Anemia in chronic kidney disease: Secondary | ICD-10-CM | POA: Diagnosis not present

## 2011-11-15 DIAGNOSIS — Z992 Dependence on renal dialysis: Secondary | ICD-10-CM | POA: Diagnosis not present

## 2011-11-15 DIAGNOSIS — N039 Chronic nephritic syndrome with unspecified morphologic changes: Secondary | ICD-10-CM | POA: Diagnosis not present

## 2011-11-15 DIAGNOSIS — N186 End stage renal disease: Secondary | ICD-10-CM | POA: Diagnosis not present

## 2011-11-15 DIAGNOSIS — N2581 Secondary hyperparathyroidism of renal origin: Secondary | ICD-10-CM | POA: Diagnosis not present

## 2011-11-15 DIAGNOSIS — D509 Iron deficiency anemia, unspecified: Secondary | ICD-10-CM | POA: Diagnosis not present

## 2011-11-17 DIAGNOSIS — D509 Iron deficiency anemia, unspecified: Secondary | ICD-10-CM | POA: Diagnosis not present

## 2011-11-17 DIAGNOSIS — D631 Anemia in chronic kidney disease: Secondary | ICD-10-CM | POA: Diagnosis not present

## 2011-11-17 DIAGNOSIS — N186 End stage renal disease: Secondary | ICD-10-CM | POA: Diagnosis not present

## 2011-11-17 DIAGNOSIS — Z992 Dependence on renal dialysis: Secondary | ICD-10-CM | POA: Diagnosis not present

## 2011-11-17 DIAGNOSIS — N2581 Secondary hyperparathyroidism of renal origin: Secondary | ICD-10-CM | POA: Diagnosis not present

## 2011-11-20 DIAGNOSIS — D631 Anemia in chronic kidney disease: Secondary | ICD-10-CM | POA: Diagnosis not present

## 2011-11-20 DIAGNOSIS — N2581 Secondary hyperparathyroidism of renal origin: Secondary | ICD-10-CM | POA: Diagnosis not present

## 2011-11-20 DIAGNOSIS — Z992 Dependence on renal dialysis: Secondary | ICD-10-CM | POA: Diagnosis not present

## 2011-11-20 DIAGNOSIS — D509 Iron deficiency anemia, unspecified: Secondary | ICD-10-CM | POA: Diagnosis not present

## 2011-11-20 DIAGNOSIS — N186 End stage renal disease: Secondary | ICD-10-CM | POA: Diagnosis not present

## 2011-11-22 DIAGNOSIS — Z992 Dependence on renal dialysis: Secondary | ICD-10-CM | POA: Diagnosis not present

## 2011-11-22 DIAGNOSIS — N2581 Secondary hyperparathyroidism of renal origin: Secondary | ICD-10-CM | POA: Diagnosis not present

## 2011-11-22 DIAGNOSIS — D631 Anemia in chronic kidney disease: Secondary | ICD-10-CM | POA: Diagnosis not present

## 2011-11-22 DIAGNOSIS — N186 End stage renal disease: Secondary | ICD-10-CM | POA: Diagnosis not present

## 2011-11-22 DIAGNOSIS — D509 Iron deficiency anemia, unspecified: Secondary | ICD-10-CM | POA: Diagnosis not present

## 2011-11-24 DIAGNOSIS — N2581 Secondary hyperparathyroidism of renal origin: Secondary | ICD-10-CM | POA: Diagnosis not present

## 2011-11-24 DIAGNOSIS — N039 Chronic nephritic syndrome with unspecified morphologic changes: Secondary | ICD-10-CM | POA: Diagnosis not present

## 2011-11-24 DIAGNOSIS — Z992 Dependence on renal dialysis: Secondary | ICD-10-CM | POA: Diagnosis not present

## 2011-11-24 DIAGNOSIS — N186 End stage renal disease: Secondary | ICD-10-CM | POA: Diagnosis not present

## 2011-11-24 DIAGNOSIS — D509 Iron deficiency anemia, unspecified: Secondary | ICD-10-CM | POA: Diagnosis not present

## 2011-11-27 DIAGNOSIS — D631 Anemia in chronic kidney disease: Secondary | ICD-10-CM | POA: Diagnosis not present

## 2011-11-27 DIAGNOSIS — N186 End stage renal disease: Secondary | ICD-10-CM | POA: Diagnosis not present

## 2011-11-27 DIAGNOSIS — D509 Iron deficiency anemia, unspecified: Secondary | ICD-10-CM | POA: Diagnosis not present

## 2011-11-27 DIAGNOSIS — N2581 Secondary hyperparathyroidism of renal origin: Secondary | ICD-10-CM | POA: Diagnosis not present

## 2011-11-27 DIAGNOSIS — Z992 Dependence on renal dialysis: Secondary | ICD-10-CM | POA: Diagnosis not present

## 2011-11-29 DIAGNOSIS — Z992 Dependence on renal dialysis: Secondary | ICD-10-CM | POA: Diagnosis not present

## 2011-11-29 DIAGNOSIS — N186 End stage renal disease: Secondary | ICD-10-CM | POA: Diagnosis not present

## 2011-11-29 DIAGNOSIS — N039 Chronic nephritic syndrome with unspecified morphologic changes: Secondary | ICD-10-CM | POA: Diagnosis not present

## 2011-11-29 DIAGNOSIS — N2581 Secondary hyperparathyroidism of renal origin: Secondary | ICD-10-CM | POA: Diagnosis not present

## 2011-11-29 DIAGNOSIS — D509 Iron deficiency anemia, unspecified: Secondary | ICD-10-CM | POA: Diagnosis not present

## 2011-12-01 DIAGNOSIS — N186 End stage renal disease: Secondary | ICD-10-CM | POA: Diagnosis not present

## 2011-12-02 DIAGNOSIS — N186 End stage renal disease: Secondary | ICD-10-CM | POA: Diagnosis not present

## 2011-12-05 DIAGNOSIS — N186 End stage renal disease: Secondary | ICD-10-CM | POA: Diagnosis not present

## 2011-12-05 DIAGNOSIS — D509 Iron deficiency anemia, unspecified: Secondary | ICD-10-CM | POA: Diagnosis not present

## 2011-12-05 DIAGNOSIS — N039 Chronic nephritic syndrome with unspecified morphologic changes: Secondary | ICD-10-CM | POA: Diagnosis not present

## 2011-12-05 DIAGNOSIS — D631 Anemia in chronic kidney disease: Secondary | ICD-10-CM | POA: Diagnosis not present

## 2011-12-05 DIAGNOSIS — N2581 Secondary hyperparathyroidism of renal origin: Secondary | ICD-10-CM | POA: Diagnosis not present

## 2011-12-06 DIAGNOSIS — N2581 Secondary hyperparathyroidism of renal origin: Secondary | ICD-10-CM | POA: Diagnosis not present

## 2011-12-06 DIAGNOSIS — D509 Iron deficiency anemia, unspecified: Secondary | ICD-10-CM | POA: Diagnosis not present

## 2011-12-06 DIAGNOSIS — N039 Chronic nephritic syndrome with unspecified morphologic changes: Secondary | ICD-10-CM | POA: Diagnosis not present

## 2011-12-06 DIAGNOSIS — N186 End stage renal disease: Secondary | ICD-10-CM | POA: Diagnosis not present

## 2011-12-08 DIAGNOSIS — N2581 Secondary hyperparathyroidism of renal origin: Secondary | ICD-10-CM | POA: Diagnosis not present

## 2011-12-08 DIAGNOSIS — D509 Iron deficiency anemia, unspecified: Secondary | ICD-10-CM | POA: Diagnosis not present

## 2011-12-08 DIAGNOSIS — N186 End stage renal disease: Secondary | ICD-10-CM | POA: Diagnosis not present

## 2011-12-08 DIAGNOSIS — D631 Anemia in chronic kidney disease: Secondary | ICD-10-CM | POA: Diagnosis not present

## 2011-12-11 DIAGNOSIS — N186 End stage renal disease: Secondary | ICD-10-CM | POA: Diagnosis not present

## 2011-12-11 DIAGNOSIS — N039 Chronic nephritic syndrome with unspecified morphologic changes: Secondary | ICD-10-CM | POA: Diagnosis not present

## 2011-12-11 DIAGNOSIS — N2581 Secondary hyperparathyroidism of renal origin: Secondary | ICD-10-CM | POA: Diagnosis not present

## 2011-12-11 DIAGNOSIS — D509 Iron deficiency anemia, unspecified: Secondary | ICD-10-CM | POA: Diagnosis not present

## 2011-12-12 DIAGNOSIS — E213 Hyperparathyroidism, unspecified: Secondary | ICD-10-CM | POA: Diagnosis not present

## 2011-12-12 DIAGNOSIS — E039 Hypothyroidism, unspecified: Secondary | ICD-10-CM | POA: Diagnosis not present

## 2011-12-13 DIAGNOSIS — N2581 Secondary hyperparathyroidism of renal origin: Secondary | ICD-10-CM | POA: Diagnosis not present

## 2011-12-13 DIAGNOSIS — D509 Iron deficiency anemia, unspecified: Secondary | ICD-10-CM | POA: Diagnosis not present

## 2011-12-13 DIAGNOSIS — N186 End stage renal disease: Secondary | ICD-10-CM | POA: Diagnosis not present

## 2011-12-13 DIAGNOSIS — D631 Anemia in chronic kidney disease: Secondary | ICD-10-CM | POA: Diagnosis not present

## 2011-12-15 DIAGNOSIS — D509 Iron deficiency anemia, unspecified: Secondary | ICD-10-CM | POA: Diagnosis not present

## 2011-12-15 DIAGNOSIS — N186 End stage renal disease: Secondary | ICD-10-CM | POA: Diagnosis not present

## 2011-12-15 DIAGNOSIS — N039 Chronic nephritic syndrome with unspecified morphologic changes: Secondary | ICD-10-CM | POA: Diagnosis not present

## 2011-12-15 DIAGNOSIS — N2581 Secondary hyperparathyroidism of renal origin: Secondary | ICD-10-CM | POA: Diagnosis not present

## 2011-12-18 DIAGNOSIS — N2581 Secondary hyperparathyroidism of renal origin: Secondary | ICD-10-CM | POA: Diagnosis not present

## 2011-12-18 DIAGNOSIS — D509 Iron deficiency anemia, unspecified: Secondary | ICD-10-CM | POA: Diagnosis not present

## 2011-12-18 DIAGNOSIS — D631 Anemia in chronic kidney disease: Secondary | ICD-10-CM | POA: Diagnosis not present

## 2011-12-18 DIAGNOSIS — N186 End stage renal disease: Secondary | ICD-10-CM | POA: Diagnosis not present

## 2011-12-20 DIAGNOSIS — D631 Anemia in chronic kidney disease: Secondary | ICD-10-CM | POA: Diagnosis not present

## 2011-12-20 DIAGNOSIS — N186 End stage renal disease: Secondary | ICD-10-CM | POA: Diagnosis not present

## 2011-12-20 DIAGNOSIS — N2581 Secondary hyperparathyroidism of renal origin: Secondary | ICD-10-CM | POA: Diagnosis not present

## 2011-12-20 DIAGNOSIS — D509 Iron deficiency anemia, unspecified: Secondary | ICD-10-CM | POA: Diagnosis not present

## 2011-12-22 DIAGNOSIS — N186 End stage renal disease: Secondary | ICD-10-CM | POA: Diagnosis not present

## 2011-12-22 DIAGNOSIS — N2581 Secondary hyperparathyroidism of renal origin: Secondary | ICD-10-CM | POA: Diagnosis not present

## 2011-12-22 DIAGNOSIS — D631 Anemia in chronic kidney disease: Secondary | ICD-10-CM | POA: Diagnosis not present

## 2011-12-22 DIAGNOSIS — D509 Iron deficiency anemia, unspecified: Secondary | ICD-10-CM | POA: Diagnosis not present

## 2011-12-24 DIAGNOSIS — D509 Iron deficiency anemia, unspecified: Secondary | ICD-10-CM | POA: Diagnosis not present

## 2011-12-24 DIAGNOSIS — D631 Anemia in chronic kidney disease: Secondary | ICD-10-CM | POA: Diagnosis not present

## 2011-12-24 DIAGNOSIS — N2581 Secondary hyperparathyroidism of renal origin: Secondary | ICD-10-CM | POA: Diagnosis not present

## 2011-12-24 DIAGNOSIS — N186 End stage renal disease: Secondary | ICD-10-CM | POA: Diagnosis not present

## 2011-12-26 DIAGNOSIS — N2581 Secondary hyperparathyroidism of renal origin: Secondary | ICD-10-CM | POA: Diagnosis not present

## 2011-12-26 DIAGNOSIS — D631 Anemia in chronic kidney disease: Secondary | ICD-10-CM | POA: Diagnosis not present

## 2011-12-26 DIAGNOSIS — N186 End stage renal disease: Secondary | ICD-10-CM | POA: Diagnosis not present

## 2011-12-26 DIAGNOSIS — D509 Iron deficiency anemia, unspecified: Secondary | ICD-10-CM | POA: Diagnosis not present

## 2011-12-29 DIAGNOSIS — D631 Anemia in chronic kidney disease: Secondary | ICD-10-CM | POA: Diagnosis not present

## 2011-12-29 DIAGNOSIS — D509 Iron deficiency anemia, unspecified: Secondary | ICD-10-CM | POA: Diagnosis not present

## 2011-12-29 DIAGNOSIS — N2581 Secondary hyperparathyroidism of renal origin: Secondary | ICD-10-CM | POA: Diagnosis not present

## 2011-12-29 DIAGNOSIS — N186 End stage renal disease: Secondary | ICD-10-CM | POA: Diagnosis not present

## 2011-12-31 DIAGNOSIS — N186 End stage renal disease: Secondary | ICD-10-CM | POA: Diagnosis not present

## 2011-12-31 DIAGNOSIS — N039 Chronic nephritic syndrome with unspecified morphologic changes: Secondary | ICD-10-CM | POA: Diagnosis not present

## 2011-12-31 DIAGNOSIS — D509 Iron deficiency anemia, unspecified: Secondary | ICD-10-CM | POA: Diagnosis not present

## 2011-12-31 DIAGNOSIS — N2581 Secondary hyperparathyroidism of renal origin: Secondary | ICD-10-CM | POA: Diagnosis not present

## 2012-01-02 DIAGNOSIS — D509 Iron deficiency anemia, unspecified: Secondary | ICD-10-CM | POA: Diagnosis not present

## 2012-01-02 DIAGNOSIS — D631 Anemia in chronic kidney disease: Secondary | ICD-10-CM | POA: Diagnosis not present

## 2012-01-02 DIAGNOSIS — N2581 Secondary hyperparathyroidism of renal origin: Secondary | ICD-10-CM | POA: Diagnosis not present

## 2012-01-02 DIAGNOSIS — N186 End stage renal disease: Secondary | ICD-10-CM | POA: Diagnosis not present

## 2012-01-05 DIAGNOSIS — Z992 Dependence on renal dialysis: Secondary | ICD-10-CM | POA: Diagnosis not present

## 2012-01-05 DIAGNOSIS — N2581 Secondary hyperparathyroidism of renal origin: Secondary | ICD-10-CM | POA: Diagnosis not present

## 2012-01-05 DIAGNOSIS — N186 End stage renal disease: Secondary | ICD-10-CM | POA: Diagnosis not present

## 2012-01-05 DIAGNOSIS — D631 Anemia in chronic kidney disease: Secondary | ICD-10-CM | POA: Diagnosis not present

## 2012-01-05 DIAGNOSIS — D509 Iron deficiency anemia, unspecified: Secondary | ICD-10-CM | POA: Diagnosis not present

## 2012-01-20 ENCOUNTER — Inpatient Hospital Stay (HOSPITAL_COMMUNITY)
Admission: EM | Admit: 2012-01-20 | Discharge: 2012-01-22 | DRG: 682 | Disposition: A | Discharge status: Home / Self Care | Payer: Medicare Other | Attending: Internal Medicine | Admitting: Internal Medicine

## 2012-01-20 ENCOUNTER — Emergency Department (HOSPITAL_COMMUNITY): Payer: Medicare Other

## 2012-01-20 ENCOUNTER — Encounter (HOSPITAL_COMMUNITY): Payer: Self-pay | Admitting: Emergency Medicine

## 2012-01-20 DIAGNOSIS — F329 Major depressive disorder, single episode, unspecified: Secondary | ICD-10-CM

## 2012-01-20 DIAGNOSIS — J9600 Acute respiratory failure, unspecified whether with hypoxia or hypercapnia: Secondary | ICD-10-CM | POA: Diagnosis present

## 2012-01-20 DIAGNOSIS — R0602 Shortness of breath: Secondary | ICD-10-CM | POA: Diagnosis not present

## 2012-01-20 DIAGNOSIS — Z8674 Personal history of sudden cardiac arrest: Secondary | ICD-10-CM

## 2012-01-20 DIAGNOSIS — Z881 Allergy status to other antibiotic agents status: Secondary | ICD-10-CM

## 2012-01-20 DIAGNOSIS — J449 Chronic obstructive pulmonary disease, unspecified: Secondary | ICD-10-CM | POA: Diagnosis present

## 2012-01-20 DIAGNOSIS — I739 Peripheral vascular disease, unspecified: Secondary | ICD-10-CM | POA: Diagnosis present

## 2012-01-20 DIAGNOSIS — E039 Hypothyroidism, unspecified: Secondary | ICD-10-CM | POA: Diagnosis present

## 2012-01-20 DIAGNOSIS — Z720 Tobacco use: Secondary | ICD-10-CM

## 2012-01-20 DIAGNOSIS — R269 Unspecified abnormalities of gait and mobility: Secondary | ICD-10-CM | POA: Diagnosis not present

## 2012-01-20 DIAGNOSIS — Z79899 Other long term (current) drug therapy: Secondary | ICD-10-CM

## 2012-01-20 DIAGNOSIS — I5032 Chronic diastolic (congestive) heart failure: Secondary | ICD-10-CM | POA: Diagnosis not present

## 2012-01-20 DIAGNOSIS — E875 Hyperkalemia: Secondary | ICD-10-CM | POA: Diagnosis present

## 2012-01-20 DIAGNOSIS — G931 Anoxic brain damage, not elsewhere classified: Secondary | ICD-10-CM

## 2012-01-20 DIAGNOSIS — I469 Cardiac arrest, cause unspecified: Secondary | ICD-10-CM | POA: Diagnosis not present

## 2012-01-20 DIAGNOSIS — E785 Hyperlipidemia, unspecified: Secondary | ICD-10-CM

## 2012-01-20 DIAGNOSIS — R0609 Other forms of dyspnea: Secondary | ICD-10-CM | POA: Diagnosis not present

## 2012-01-20 DIAGNOSIS — J9601 Acute respiratory failure with hypoxia: Secondary | ICD-10-CM

## 2012-01-20 DIAGNOSIS — I509 Heart failure, unspecified: Secondary | ICD-10-CM | POA: Diagnosis present

## 2012-01-20 DIAGNOSIS — Z992 Dependence on renal dialysis: Secondary | ICD-10-CM | POA: Diagnosis not present

## 2012-01-20 DIAGNOSIS — N2581 Secondary hyperparathyroidism of renal origin: Secondary | ICD-10-CM | POA: Diagnosis present

## 2012-01-20 DIAGNOSIS — N039 Chronic nephritic syndrome with unspecified morphologic changes: Secondary | ICD-10-CM | POA: Diagnosis not present

## 2012-01-20 DIAGNOSIS — F17200 Nicotine dependence, unspecified, uncomplicated: Secondary | ICD-10-CM | POA: Diagnosis present

## 2012-01-20 DIAGNOSIS — Z85528 Personal history of other malignant neoplasm of kidney: Secondary | ICD-10-CM

## 2012-01-20 DIAGNOSIS — I10 Essential (primary) hypertension: Secondary | ICD-10-CM

## 2012-01-20 DIAGNOSIS — R5381 Other malaise: Secondary | ICD-10-CM

## 2012-01-20 DIAGNOSIS — I6350 Cerebral infarction due to unspecified occlusion or stenosis of unspecified cerebral artery: Secondary | ICD-10-CM

## 2012-01-20 DIAGNOSIS — Z9089 Acquired absence of other organs: Secondary | ICD-10-CM | POA: Diagnosis not present

## 2012-01-20 DIAGNOSIS — R0600 Dyspnea, unspecified: Secondary | ICD-10-CM

## 2012-01-20 DIAGNOSIS — N186 End stage renal disease: Secondary | ICD-10-CM | POA: Diagnosis present

## 2012-01-20 DIAGNOSIS — I2510 Atherosclerotic heart disease of native coronary artery without angina pectoris: Secondary | ICD-10-CM | POA: Diagnosis present

## 2012-01-20 DIAGNOSIS — Z90710 Acquired absence of both cervix and uterus: Secondary | ICD-10-CM

## 2012-01-20 DIAGNOSIS — G40909 Epilepsy, unspecified, not intractable, without status epilepticus: Secondary | ICD-10-CM | POA: Diagnosis present

## 2012-01-20 DIAGNOSIS — R569 Unspecified convulsions: Secondary | ICD-10-CM

## 2012-01-20 DIAGNOSIS — Z905 Acquired absence of kidney: Secondary | ICD-10-CM

## 2012-01-20 DIAGNOSIS — G9340 Encephalopathy, unspecified: Secondary | ICD-10-CM | POA: Diagnosis not present

## 2012-01-20 DIAGNOSIS — F32A Depression, unspecified: Secondary | ICD-10-CM

## 2012-01-20 DIAGNOSIS — Z885 Allergy status to narcotic agent status: Secondary | ICD-10-CM

## 2012-01-20 DIAGNOSIS — Z880 Allergy status to penicillin: Secondary | ICD-10-CM

## 2012-01-20 DIAGNOSIS — R9431 Abnormal electrocardiogram [ECG] [EKG]: Secondary | ICD-10-CM

## 2012-01-20 DIAGNOSIS — I120 Hypertensive chronic kidney disease with stage 5 chronic kidney disease or end stage renal disease: Principal | ICD-10-CM | POA: Diagnosis present

## 2012-01-20 DIAGNOSIS — J4489 Other specified chronic obstructive pulmonary disease: Secondary | ICD-10-CM | POA: Diagnosis present

## 2012-01-20 DIAGNOSIS — Z8673 Personal history of transient ischemic attack (TIA), and cerebral infarction without residual deficits: Secondary | ICD-10-CM

## 2012-01-20 DIAGNOSIS — Z9861 Coronary angioplasty status: Secondary | ICD-10-CM

## 2012-01-20 DIAGNOSIS — Z8249 Family history of ischemic heart disease and other diseases of the circulatory system: Secondary | ICD-10-CM

## 2012-01-20 DIAGNOSIS — F0390 Unspecified dementia without behavioral disturbance: Secondary | ICD-10-CM | POA: Diagnosis present

## 2012-01-20 DIAGNOSIS — R4182 Altered mental status, unspecified: Secondary | ICD-10-CM | POA: Diagnosis not present

## 2012-01-20 DIAGNOSIS — R0989 Other specified symptoms and signs involving the circulatory and respiratory systems: Secondary | ICD-10-CM | POA: Diagnosis not present

## 2012-01-20 DIAGNOSIS — R404 Transient alteration of awareness: Secondary | ICD-10-CM | POA: Diagnosis not present

## 2012-01-20 DIAGNOSIS — D631 Anemia in chronic kidney disease: Secondary | ICD-10-CM | POA: Diagnosis present

## 2012-01-20 DIAGNOSIS — K219 Gastro-esophageal reflux disease without esophagitis: Secondary | ICD-10-CM

## 2012-01-20 HISTORY — DX: End stage renal disease: N18.6

## 2012-01-20 LAB — URINE MICROSCOPIC-ADD ON

## 2012-01-20 LAB — CBC WITH DIFFERENTIAL/PLATELET
Basophils Relative: 1 % (ref 0–1)
Eosinophils Absolute: 0.2 10*3/uL (ref 0.0–0.7)
Eosinophils Relative: 5 % (ref 0–5)
Hemoglobin: 11.9 g/dL — ABNORMAL LOW (ref 12.0–15.0)
MCH: 26.4 pg (ref 26.0–34.0)
MCHC: 31.6 g/dL (ref 30.0–36.0)
Monocytes Relative: 12 % (ref 3–12)
Neutrophils Relative %: 43 % (ref 43–77)
Platelets: 97 10*3/uL — ABNORMAL LOW (ref 150–400)

## 2012-01-20 LAB — URINALYSIS, ROUTINE W REFLEX MICROSCOPIC
Bilirubin Urine: NEGATIVE
Leukocytes, UA: NEGATIVE
Nitrite: NEGATIVE
Specific Gravity, Urine: 1.01 (ref 1.005–1.030)
Urobilinogen, UA: 0.2 mg/dL (ref 0.0–1.0)
pH: 8.5 — ABNORMAL HIGH (ref 5.0–8.0)

## 2012-01-20 LAB — COMPREHENSIVE METABOLIC PANEL
ALT: 9 U/L (ref 0–35)
Calcium: 11.5 mg/dL — ABNORMAL HIGH (ref 8.4–10.5)
Creatinine, Ser: 6.71 mg/dL — ABNORMAL HIGH (ref 0.50–1.10)
GFR calc Af Amer: 7 mL/min — ABNORMAL LOW (ref >90)
Glucose, Bld: 75 mg/dL (ref 70–99)
Sodium: 138 mEq/L (ref 135–145)
Total Protein: 6.2 g/dL (ref 6.0–8.3)

## 2012-01-20 MED ORDER — ALTEPLASE 2 MG IJ SOLR: 2.0000 mg | Freq: Once | INTRAMUSCULAR | Status: AC | PRN

## 2012-01-20 MED ORDER — SODIUM CHLORIDE 0.9 % IV SOLN
INTRAVENOUS | Status: DC
  Administered 2012-01-20: 20 mL/h via INTRAVENOUS

## 2012-01-20 MED ORDER — LIDOCAINE-PRILOCAINE 2.5-2.5 % EX CREA
1.0000 "application " | TOPICAL_CREAM | CUTANEOUS | Status: DC | PRN
  Filled 2012-01-20: qty 5

## 2012-01-20 MED ORDER — DIPHENHYDRAMINE HCL 50 MG/ML IJ SOLN
25.0000 mg | Freq: Once | INTRAMUSCULAR | Status: AC
  Administered 2012-01-20: 25 mg via INTRAVENOUS

## 2012-01-20 MED ORDER — DIPHENHYDRAMINE HCL 50 MG/ML IJ SOLN
INTRAMUSCULAR | Status: AC
  Administered 2012-01-20: 25 mg via INTRAVENOUS
  Filled 2012-01-20: qty 1

## 2012-01-20 MED ORDER — SODIUM POLYSTYRENE SULFONATE 15 GM/60ML PO SUSP
30.0000 g | Freq: Once | ORAL | Status: AC
  Administered 2012-01-20: 30 g via ORAL
  Filled 2012-01-20: qty 120

## 2012-01-20 MED ORDER — LIDOCAINE HCL (PF) 1 % IJ SOLN: 5.0000 mL | INTRAMUSCULAR | Status: DC | PRN

## 2012-01-20 MED ORDER — SODIUM CHLORIDE 0.9 % IV SOLN: 100.0000 mL | INTRAVENOUS | Status: DC | PRN

## 2012-01-20 MED ORDER — HYDRALAZINE HCL 20 MG/ML IJ SOLN
10.0000 mg | Freq: Once | INTRAMUSCULAR | Status: AC
Start: 2012-01-20 — End: 2012-01-20
  Administered 2012-01-20: 10 mg via INTRAVENOUS
  Filled 2012-01-20: qty 1

## 2012-01-20 MED ORDER — PENTAFLUOROPROP-TETRAFLUOROETH EX AERO: 1.0000 "application " | INHALATION_SPRAY | CUTANEOUS | Status: DC | PRN

## 2012-01-20 MED ORDER — NEPRO/CARBSTEADY PO LIQD
237.0000 mL | ORAL | Status: DC | PRN
  Filled 2012-01-20: qty 237

## 2012-01-20 MED ORDER — HEPARIN SODIUM (PORCINE) 1000 UNIT/ML DIALYSIS: 1000.0000 [IU] | INTRAMUSCULAR | Status: DC | PRN

## 2012-01-20 NOTE — ED Provider Notes (Signed)
  Physical Exam  BP 214/73  Pulse 69  Temp 98.2 F (36.8 C) (Oral)  Resp 20  SpO2 93%  Physical Exam Check ct scan than admit for progressive ataxia and SOB ED Course  Procedures  MDM      Arman Filter, NP 01/21/12 2001

## 2012-01-20 NOTE — ED Notes (Signed)
Patient hard stick

## 2012-01-20 NOTE — ED Notes (Signed)
Pt c/o lightheadedness and trouble with balance x 3 days; pt dialysis pt with last dialysis yesterday; pt sts generalized weakness as well

## 2012-01-20 NOTE — Consult Note (Signed)
[name] 01/20/2012 Meital Riehl D Requesting Physician:  ED physician at [facility]  Reason for Consult:  Hypoxemia, SOB in ESRD pt with AMS HPI: The patient is a 67 y.o. year-old with ESRD due to HTN/NSAID"s, CAD w stents, CVA with memory deficits, HTN, renal cancer and hypothyroidism. She presented to ED today reporting difficulty walking, falling, no balance for 3 days. Also some dyspnea. In ED patient desat's into 80's off of nasal O2. CXR showed vasc congestion only. Pt and daughter deny any prod cough, fever, resp distress or chest pain. Has not missed any dialysis lately. See also PMH for other renal hx.   ROS  no HA or blurred vision  no n/v/d or abd pain  no leg edema  no rash or itching   Past Medical History:  Past Medical History  Diagnosis Date  . CAD (coronary artery disease)     stent to RCA  . CVA (cerebral infarction) 2003    no apparent residual  . Hypothyroidism   . PVD (peripheral vascular disease)   . Hyperlipidemia   . Positive PPD     completed rifampin  . Diastolic congestive heart failure   . Hypertension   . COPD (chronic obstructive pulmonary disease) t  . Cancer     clear cell cancer, kidney  . Complication of anesthesia 12/2010    pt is very confused, with AMS with anesthesia  . CVA 07/27/2008    CVA affected cognition and memory per family, no focal deficits.    . ESRD 07/27/2008    ESRD due to HTN and NSAID's, started hemodialysis in 2005 in [place], [place]. Went to [facility] from 2010 to 2012 and since 2012 has been getting dialysis at [facility] on [address] in [place] on a MWF schedule. First access with RUA AVG placed in [place]. Next and current access was LUA AVG placed by Dr. Wyn Quaker in [place] in or around 2012. Has had 2 or 3 procedures on that graft since placed per family. She gets her access work done here in [place] now. She is allergic to heparin and does not get any heparin at dialysis; she had an allergic reaction  apparently when in ICU in the past.        Past Surgical History:  Past Surgical History  Procedure Date  . Abdominal hysterectomy   . Cholecystectomy   . D&cs   . Right ankle repair   . Left nephrectomy   . Vascular surgery 11/2010    graft inserted to left arm    Family History:  Family History  Problem Relation Age of Onset  . Heart disease Father   . Hypertension Mother   . Dementia Mother   . Coronary artery disease Sister    Social History:  reports that she has been smoking Cigarettes.  She has a 106 pack-year smoking history. She has never used smokeless tobacco. She reports that she does not drink alcohol or use illicit drugs.  Allergies:  Allergies  Allergen Reactions  . Iohexol Swelling and Other (See Comments)    1970s; passed out and had facial/tongue swelling.  Requires 13-hour prep with prednisone and benadryl  . Ampicillin Hives and Rash  . Meperidine Hcl Swelling and Rash    Makes tongue swell  . Morphine Rash  . Penicillins Rash  . Valproic Acid And Related Other (See Comments)    Confusion   . Heparin     MDs told her not to take after reaction in ICU  .  Pentazocine Lactate     Patient does not remember reaction to this med (Talwin).   . Phenytoin Other (See Comments)    Had reaction while in ICU; doesn't know.  MDs told her not to take ever again.  . Wellbutrin (Bupropion)     sizures  . Ace Inhibitors Rash    Home medications: Prior to Admission medications   Medication Sig Start Date End Date Taking? Authorizing Provider  albuterol-ipratropium (COMBIVENT) 18-103 MCG/ACT inhaler Inhale 2 puffs into the lungs every 6 (six) hours as needed. For shortness of breath   Yes Historical Provider, MD  amLODipine (NORVASC) 10 MG tablet Take 10 mg by mouth daily.    Yes Historical Provider, MD  carvedilol (COREG) 25 MG tablet Take 25 mg by mouth 2 (two) times daily with a meal.  10/30/10  Yes Historical Provider, MD  citalopram (CELEXA) 20 MG tablet  Take 20 mg by mouth daily.    Yes Historical Provider, MD  Fluticasone-Salmeterol (ADVAIR) 100-50 MCG/DOSE AEPB Inhale 1 puff into the lungs every 12 (twelve) hours.   Yes Historical Provider, MD  hydrALAZINE (APRESOLINE) 50 MG tablet Take 1 tablet (50 mg total) by mouth 4 (four) times daily. 05/16/11  Yes Evlyn Kanner Love, PA  levothyroxine (SYNTHROID, LEVOTHROID) 88 MCG tablet Take 88 mcg by mouth daily.    Yes Historical Provider, MD  multivitamin (RENA-VIT) TABS tablet Take 1 tablet by mouth daily.    Yes Historical Provider, MD  NITROSTAT 0.4 MG SL tablet Place 0.4 mg under the tongue every 5 (five) minutes as needed. For chest pain. 09/16/10  Yes Historical Provider, MD  polyethylene glycol powder (GLYCOLAX/MIRALAX) powder Take 17 g by mouth daily as needed. For constipation. 09/28/10  Yes Historical Provider, MD  pravastatin (PRAVACHOL) 40 MG tablet Take 80 mg by mouth at bedtime.    Yes Historical Provider, MD  sevelamer (RENAGEL) 800 MG tablet Take 1,600-2,800 mg by mouth 3 (three) times daily with meals. Take 4 tablets in the morning, two tablets at noon, and 4 tablets at night   Yes Historical Provider, MD  traZODone (DESYREL) 50 MG tablet Take 100 mg by mouth at bedtime.   Yes Historical Provider, MD  diphenhydrAMINE (BENADRYL) 25 MG tablet Take 2 tablets (50 mg total) by mouth 1 day or 1 dose. 08/29/11 10/19/11  Reola Calkins, MD    Labs: Basic Metabolic Panel:  Lab 01/20/12 1610  NA 138  K 5.4*  CL 101  CO2 26  GLUCOSE 75  BUN 21  CREATININE 6.71*  ALB --  CALCIUM 11.5*  PHOS --   Liver Function Tests:  Lab 01/20/12 1546  AST 20  ALT 9  ALKPHOS 59  BILITOT 0.3  PROT 6.2  ALBUMIN 3.2*   No results found for this basename: LIPASE:3,AMYLASE:3 in the last 168 hours No results found for this basename: AMMONIA:3 in the last 168 hours CBC:  Lab 01/20/12 1546  WBC 3.5*  NEUTROABS 1.5*  HGB 11.9*  HCT 37.7  MCV 83.8  PLT 97*   PT/INR: @labrcntip (inr:5) Cardiac  Enzymes: No results found for this basename: CKTOTAL:5,CKMB:5,CKMBINDEX:5,TROPONINI:5 in the last 168 hours CBG: No results found for this basename: GLUCAP:5 in the last 168 hours  Physical Exam:  Blood pressure 197/71, pulse 66, temperature 98.2 F (36.8 C), temperature source Oral, resp. rate 15, SpO2 95.00%.  Gen: alert elderly female, slightly confused, not in distress HEENT:  EOMI, sclera anicteric, throat clear Neck: no JVD, no LAN Chest: clear bilat,  no rales or wheezing CV: regular, no rub or gallop, no murmur, no carotid bruits, pedal pulses intact Abdomen: soft, nontender, no ascites or HSM, well-healed scars Ext: no LE edema, no joint effusion or deformity, no gangrene or ulceration Neuro: alert, Ox3, no focal deficit, no asterixis Access: LUA AVGG with good thrill, no signs of infection on LUA AVG or old RUA AVG  Outpatient HD: MWF at [facility]   Impression/Plan 1. AMS- off and on problem for her chronically, prob with underlying dementia. Eval per primary 2. Hypoxemia- no gross edema on exam or xray, but with BP up and oxygen desat's will do short HD tonight to see if volume removal will have an effect 3. Hyperkalemia- HD tonight should take care of this  4. ESRD, mwf HD at [facility] 5. HTN/volume- bp high, cont BP meds, UF with HD tonight 6. Anemia of CKD- get OP records 7. Secondary HPTH- get OP records 8. Hx CVA 9. Hx dementia/confusion 10. CAD w stent 11. Hx cardiac arrest/anoxic encephalopathy in the past  Will follow.  Vinson Moselle  MD [facility] [phone] pgr    [phone] cell 01/20/2012, 9:35 PM

## 2012-01-20 NOTE — H&P (Signed)
PCP:   [name], MD   Chief Complaint:  Confusion, left side numb  HPI: 67 yo female ESRD dialysis mwf lives alone brought in by dtr for some mild confusion and left facial and arm numbness which has resolved.  No confusion now.  No fevers.  No n/v/d.  No cp no sob no edema le , no abd pain.  No weakness in arms or legs.  No headache or vision changes.  sbp over 210 which is unusual per dtr.  Last dialysis Friday.  Also oxgyen 80s on RA on arrival and does not requrie oxygen at home.  Pt is back to her baseline right now but still on 2 L [place] and sbp over 200.  Review of Systems:  O/w neg  Past Medical History: Past Medical History  Diagnosis Date  . CAD (coronary artery disease)     stent to RCA  . CVA (cerebral infarction) 2003    no apparent residual  . Hypothyroidism   . PVD (peripheral vascular disease)   . Hyperlipidemia   . Positive PPD     completed rifampin  . Diastolic congestive heart failure   . Hypertension   . COPD (chronic obstructive pulmonary disease) t  . Cancer     clear cell cancer, kidney  . Complication of anesthesia 12/2010    pt is very confused, with AMS with anesthesia  . CVA 07/27/2008    CVA affected cognition and memory per family, no focal deficits.     Past Surgical History  Procedure Date  . Abdominal hysterectomy   . Cholecystectomy   . D&cs   . Right ankle repair   . Left nephrectomy   . Vascular surgery 11/2010    graft inserted to left arm    Medications: Prior to Admission medications   Medication Sig Start Date End Date Taking? Authorizing Provider  albuterol-ipratropium (COMBIVENT) 18-103 MCG/ACT inhaler Inhale 2 puffs into the lungs every 6 (six) hours as needed. For shortness of breath   Yes Historical Provider, MD  amLODipine (NORVASC) 10 MG tablet Take 10 mg by mouth daily.    Yes Historical Provider, MD  carvedilol (COREG) 25 MG tablet Take 25 mg by mouth 2 (two) times daily with a meal.  10/30/10  Yes Historical  Provider, MD  citalopram (CELEXA) 20 MG tablet Take 20 mg by mouth daily.    Yes Historical Provider, MD  Fluticasone-Salmeterol (ADVAIR) 100-50 MCG/DOSE AEPB Inhale 1 puff into the lungs every 12 (twelve) hours.   Yes Historical Provider, MD  hydrALAZINE (APRESOLINE) 50 MG tablet Take 1 tablet (50 mg total) by mouth 4 (four) times daily. 05/16/11  Yes Evlyn Kanner Love, PA  levothyroxine (SYNTHROID, LEVOTHROID) 88 MCG tablet Take 88 mcg by mouth daily.    Yes Historical Provider, MD  multivitamin (RENA-VIT) TABS tablet Take 1 tablet by mouth daily.    Yes Historical Provider, MD  NITROSTAT 0.4 MG SL tablet Place 0.4 mg under the tongue every 5 (five) minutes as needed. For chest pain. 09/16/10  Yes Historical Provider, MD  polyethylene glycol powder (GLYCOLAX/MIRALAX) powder Take 17 g by mouth daily as needed. For constipation. 09/28/10  Yes Historical Provider, MD  pravastatin (PRAVACHOL) 40 MG tablet Take 80 mg by mouth at bedtime.    Yes Historical Provider, MD  sevelamer (RENAGEL) 800 MG tablet Take 1,600-2,800 mg by mouth 3 (three) times daily with meals. Take 4 tablets in the morning, two tablets at noon, and 4 tablets at night  Yes Historical Provider, MD  traZODone (DESYREL) 50 MG tablet Take 100 mg by mouth at bedtime.   Yes Historical Provider, MD  diphenhydrAMINE (BENADRYL) 25 MG tablet Take 2 tablets (50 mg total) by mouth 1 day or 1 dose. 08/29/11 10/19/11  Reola Calkins, MD    Allergies:   Allergies  Allergen Reactions  . Iohexol Swelling and Other (See Comments)    1970s; passed out and had facial/tongue swelling.  Requires 13-hour prep with prednisone and benadryl  . Ampicillin Hives and Rash  . Meperidine Hcl Swelling and Rash    Makes tongue swell  . Morphine Rash  . Penicillins Rash  . Valproic Acid And Related Other (See Comments)    Confusion   . Heparin     MDs told her not to take after reaction in ICU  . Pentazocine Lactate     Patient does not remember reaction to  this med (Talwin).   . Phenytoin Other (See Comments)    Had reaction while in ICU; doesn't know.  MDs told her not to take ever again.  . Wellbutrin (Bupropion)     sizures  . Ace Inhibitors Rash    Social History:  reports that she has been smoking Cigarettes.  She has a 106 pack-year smoking history. She has never used smokeless tobacco. She reports that she does not drink alcohol or use illicit drugs.  Family History: Family History  Problem Relation Age of Onset  . Heart disease Father   . Hypertension Mother   . Dementia Mother   . Coronary artery disease Sister     Physical Exam: Filed Vitals:   01/20/12 1945 01/20/12 2000 01/20/12 2045 01/20/12 2100  BP: 201/69 201/78 189/77 197/71  Pulse: 65 66 66 66  Temp:      TempSrc:      Resp: 19 14 17 15   SpO2: 94% 94% 96% 95%   General appearance: alert, cooperative and no distress Neck: no JVD and supple, symmetrical, trachea midline Lungs: clear to auscultation bilaterally Heart: regular rate and rhythm, S1, S2 normal, no murmur, click, rub or gallop Abdomen: soft, non-tender; bowel sounds normal; no masses,  no organomegaly Extremities: extremities normal, atraumatic, no cyanosis or edema Pulses: 2+ and symmetric Skin: Skin color, texture, turgor normal. No rashes or lesions Neurologic: Grossly normal     Labs on Admission:   [facility] 01/20/12 1546  NA 138  K 5.4*  CL 101  CO2 26  GLUCOSE 75  BUN 21  CREATININE 6.71*  CALCIUM 11.5*  MG --  PHOS --    Basename 01/20/12 1546  AST 20  ALT 9  ALKPHOS 59  BILITOT 0.3  PROT 6.2  ALBUMIN 3.2*    Basename 01/20/12 1546  WBC 3.5*  NEUTROABS 1.5*  HGB 11.9*  HCT 37.7  MCV 83.8  PLT 97*    Radiological Exams on Admission: Dg Chest 2 View  01/20/2012  *RADIOLOGY REPORT*  Clinical Data: 67 year old female with dizziness and weakness.  CHEST - 2 VIEW  Comparison: 04/29/2011  Findings: Enlargement of the cardiopericardial silhouette is again noted.  Mild pulmonary vascular congestion is present. There is no evidence of focal airspace disease, pulmonary edema, suspicious pulmonary nodule/mass, pleural effusion, or pneumothorax. No acute bony abnormalities are identified.  IMPRESSION: Enlargement of the cardiopericardial silhouette with mild pulmonary vascular congestion.   Original Report Authenticated By: Harmon Pier, M.D.    Ct Head Wo Contrast  01/20/2012  *RADIOLOGY REPORT*  Clinical Data: Imbalance.  No headache.  Hypertension.  End-stage renal disease.  CT HEAD WITHOUT CONTRAST  Technique:  Contiguous axial images were obtained from the base of the skull through the vertex without contrast.  Comparison: MRI brain 05/09/2011 most recent.  CT head most recent 05/08/2011.  Findings:  There is no evidence for acute infarction, intracranial hemorrhage, mass lesion, hydrocephalus, or extra-axial fluid.  Mild premature atrophy.  Moderate chronic microvascular ischemic change. Remote left posterior frontoparietal cortical and subcortical infarction.  Smaller remote left frontal infarct.  Bilateral thalamic lacunes.  Bilateral cerebellar infarcts.  Calvarium intact.  Carotid atherosclerosis.  No sinus or mastoid disease.  IMPRESSION: Chronic changes as described.  No definite acute infarct or hemorrhage.   Original Report Authenticated By: Davonna Belling, M.D.     Assessment/Plan  67 yo female esrd with prob hypertensive encephalopathy and mild edema from htn uncontrolled Principal Problem:  *Essential hypertension, malignant Active Problems:  Chronic diastolic heart failure  ESRD  Encephalopathy acute  Epilepsy  Neuro cks overnight.  nephro called for consult for dialysis over the weekend.  Hydralazine prn given in ED.  Give home doses of hydralazine, coreg and norvasc now.  No focal neuro def at this time.  Tele.    Alyrica Thurow A 01/20/2012, 9:27 PM

## 2012-01-20 NOTE — ED Provider Notes (Addendum)
History     CSN: [id number]  Arrival date & time 01/20/12  1423   First MD Initiated Contact with Patient 01/20/12 1559      Chief Complaint  Patient presents with  . Altered Mental Status    (Consider location/radiation/quality/duration/timing/severity/associated sxs/prior treatment) Patient is a 67 y.o. female presenting with altered mental status. The history is provided by the patient and a relative.  Altered Mental Status   patient here with lightheadedness and trouble with balance x3 days. The daughter notes that her symptoms have been progressively worse over the past 2-3 months. She also notes intermittent confusion. Has been seen by her doctors without diagnosis. No recent fever, vomiting, headache, neck pain, photophobia, chest pain, abdominal pain. She is a dialysis patient was dialyzed yesterday for her full regular time. Symptoms worse when she stands.  Past Medical History  Diagnosis Date  . CAD (coronary artery disease)     stent to RCA  . CVA (cerebral infarction) 2003    no apparent residual  . Renal failure     related to hypertension  . Hypothyroidism   . PVD (peripheral vascular disease)   . Hyperlipidemia   . Positive PPD     completed rifampin  . Diastolic congestive heart failure   . Renal insufficiency   . Hypertension   . COPD (chronic obstructive pulmonary disease) t  . Cancer     clear cell cancer, kidney  . Complication of anesthesia 12/2010    pt is very confused, with AMS with anesthesia    Past Surgical History  Procedure Date  . Abdominal hysterectomy   . Cholecystectomy   . D&cs   . Right ankle repair   . Left nephrectomy   . Vascular surgery 11/2010    graft inserted to left arm    Family History  Problem Relation Age of Onset  . Heart disease Father   . Hypertension Mother   . Dementia Mother   . Coronary artery disease Sister     History  Substance Use Topics  . Smoking status: Current Every Day Smoker -- 2.0  packs/day for 53 years    Types: Cigarettes  . Smokeless tobacco: Never Used     Comment: 1/2 ppd since age 79. has stopped since hospitalization but wants to go back  . Alcohol Use: No    OB History    Grav Para Term Preterm Abortions TAB SAB Ect Mult Living                  Review of Systems  Psychiatric/Behavioral: Positive for altered mental status.  All other systems reviewed and are negative.    Allergies  Iohexol; Ampicillin; Meperidine hcl; Morphine; Penicillins; Valproic acid and related; Heparin; Pentazocine lactate; Phenytoin; Wellbutrin; and Ace inhibitors  Home Medications   Current Outpatient Rx  Name  Route  Sig  Dispense  Refill  . IPRATROPIUM-ALBUTEROL 18-103 MCG/ACT IN AERO   Inhalation   Inhale 2 puffs into the lungs every 6 (six) hours as needed. For shortness of breath         . AMLODIPINE BESYLATE 10 MG PO TABS   Oral   Take 10 mg by mouth daily.          [name] CARVEDILOL 25 MG PO TABS   Oral   Take 25 mg by mouth 2 (two) times daily with a meal.          . CITALOPRAM HYDROBROMIDE 20 MG PO TABS   Oral  Take 20 mg by mouth daily.          [name] FLUTICASONE-SALMETEROL 100-50 MCG/DOSE IN AEPB   Inhalation   Inhale 1 puff into the lungs every 12 (twelve) hours.         [name] HYDRALAZINE HCL 50 MG PO TABS   Oral   Take 1 tablet (50 mg total) by mouth 4 (four) times daily.   120 tablet   1     NOTE THE CHANGE IN DOSAGE   . LEVOTHYROXINE SODIUM 88 MCG PO TABS   Oral   Take 88 mcg by mouth daily.          [name] RENA-VITE PO TABS   Oral   Take 1 tablet by mouth daily.          [name] NITROSTAT 0.4 MG SL SUBL   Sublingual   Place 0.4 mg under the tongue every 5 (five) minutes as needed. For chest pain.         [name] POLYETHYLENE GLYCOL 3350 PO POWD   Oral   Take 17 g by mouth daily as needed. For constipation.         [name] PRAVASTATIN SODIUM 40 MG PO TABS   Oral   Take 80 mg by mouth at bedtime.          [name] SEVELAMER HCL 800 MG PO TABS    Oral   Take 1,600-2,800 mg by mouth 3 (three) times daily with meals. Take 4 tablets in the morning, two tablets at noon, and 4 tablets at night         . TRAZODONE HCL 50 MG PO TABS   Oral   Take 100 mg by mouth at bedtime.         [name] DIPHENHYDRAMINE HCL 25 MG PO TABS   Oral   Take 2 tablets (50 mg total) by mouth 1 day or 1 dose.   2 tablet   0     TAKE 50 MG BENADRYL 1 HR PRIOR TO CT SCAN     BP 181/71  Pulse 60  Temp 97.8 F (36.6 C) (Oral)  Resp 18  SpO2 97%  Physical Exam  Nursing note and vitals reviewed. Constitutional: She is oriented to person, place, and time. She appears well-developed and well-nourished.  Non-toxic appearance. No distress.  HENT:  Head: Normocephalic and atraumatic.  Eyes: Conjunctivae normal, EOM and lids are normal. Pupils are equal, round, and reactive to light.  Neck: Normal range of motion. Neck supple. No tracheal deviation present. No mass present.  Cardiovascular: Normal rate, regular rhythm and normal heart sounds.  Exam reveals no gallop.   No murmur heard. Pulmonary/Chest: Effort normal and breath sounds normal. No stridor. No respiratory distress. She has no decreased breath sounds. She has no wheezes. She has no rhonchi. She has no rales.  Abdominal: Soft. Normal appearance and bowel sounds are normal. She exhibits no distension. There is no tenderness. There is no rebound and no CVA tenderness.  Musculoskeletal: Normal range of motion. She exhibits no edema and no tenderness.  Neurological: She is alert and oriented to person, place, and time. She has normal strength. No cranial nerve deficit or sensory deficit. GCS eye subscore is 4. GCS verbal subscore is 5. GCS motor subscore is 6.  Skin: Skin is warm and dry. No abrasion and no rash noted.  Psychiatric: She has a normal mood and affect. Her speech is normal and behavior is normal.    ED Course  Procedures (including critical care time)   Labs Reviewed  CBC WITH  DIFFERENTIAL  COMPREHENSIVE METABOLIC PANEL   No results found.   No diagnosis found.    MDM  Patient given Kayexalate for her mild hyperkalemia of 5.4. Chest x-ray results reviewed and shows possible pericardial effusion. She began to desaturate down to the low 80s and was given supplemental oxygen. CT chest will be performed for evaluation of the pericardial sac as well as to rule out pulmonary embolism.  8:16 PM Patient has allergy to contrast dye and so therefore when I had chest CT. Patient may be fluid overloaded and will consult nephrology. She was given hydralazine 10 mg IV push for her high blood pressure. She'll be admitted by [facility]      Toy Baker, MD 01/20/12 1940  Toy Baker, MD 01/20/12 1941  Toy Baker, MD 01/20/12 2016

## 2012-01-21 ENCOUNTER — Encounter (HOSPITAL_COMMUNITY): Payer: Self-pay | Admitting: *Deleted

## 2012-01-21 DIAGNOSIS — J9601 Acute respiratory failure with hypoxia: Secondary | ICD-10-CM

## 2012-01-21 DIAGNOSIS — J9600 Acute respiratory failure, unspecified whether with hypoxia or hypercapnia: Secondary | ICD-10-CM

## 2012-01-21 LAB — CBC
MCH: 26.7 pg (ref 26.0–34.0)
Platelets: 85 10*3/uL — ABNORMAL LOW (ref 150–400)
RBC: 4.57 MIL/uL (ref 3.87–5.11)
RDW: 14.7 % (ref 11.5–15.5)
WBC: 2.8 10*3/uL — ABNORMAL LOW (ref 4.0–10.5)

## 2012-01-21 LAB — RENAL FUNCTION PANEL
Albumin: 3.2 g/dL — ABNORMAL LOW (ref 3.5–5.2)
BUN: 9 mg/dL (ref 6–23)
Chloride: 100 mEq/L (ref 96–112)
GFR calc Af Amer: 11 mL/min — ABNORMAL LOW (ref >90)
Phosphorus: 4.3 mg/dL (ref 2.3–4.6)
Potassium: 3.7 mEq/L (ref 3.5–5.1)
Sodium: 139 mEq/L (ref 135–145)

## 2012-01-21 MED ORDER — LORAZEPAM 2 MG/ML IJ SOLN
INTRAMUSCULAR | Status: AC
  Administered 2012-01-21: 0.5 mg via INTRAVENOUS
  Filled 2012-01-21: qty 1

## 2012-01-21 MED ORDER — CARVEDILOL 25 MG PO TABS
25.0000 mg | ORAL_TABLET | Freq: Two times a day (BID) | ORAL | Status: DC
  Administered 2012-01-21 – 2012-01-22 (×3): 25 mg via ORAL
  Filled 2012-01-21 (×5): qty 1

## 2012-01-21 MED ORDER — MOMETASONE FURO-FORMOTEROL FUM 100-5 MCG/ACT IN AERO
2.0000 | INHALATION_SPRAY | Freq: Two times a day (BID) | RESPIRATORY_TRACT | Status: DC
  Administered 2012-01-21 – 2012-01-22 (×2): 2 via RESPIRATORY_TRACT
  Filled 2012-01-21: qty 8.8

## 2012-01-21 MED ORDER — SODIUM CHLORIDE 0.9 % IJ SOLN
3.0000 mL | Freq: Two times a day (BID) | INTRAMUSCULAR | Status: DC
  Administered 2012-01-21 – 2012-01-22 (×2): 3 mL via INTRAVENOUS

## 2012-01-21 MED ORDER — ALBUTEROL SULFATE (5 MG/ML) 0.5% IN NEBU: 2.5000 mg | INHALATION_SOLUTION | RESPIRATORY_TRACT | Status: DC | PRN

## 2012-01-21 MED ORDER — RENA-VITE PO TABS
1.0000 | ORAL_TABLET | Freq: Every day | ORAL | Status: DC
  Administered 2012-01-21 – 2012-01-22 (×2): 1 via ORAL
  Filled 2012-01-21 (×2): qty 1

## 2012-01-21 MED ORDER — CITALOPRAM HYDROBROMIDE 20 MG PO TABS
20.0000 mg | ORAL_TABLET | Freq: Every day | ORAL | Status: DC
  Administered 2012-01-21 – 2012-01-22 (×2): 20 mg via ORAL
  Filled 2012-01-21 (×2): qty 1

## 2012-01-21 MED ORDER — LORAZEPAM 2 MG/ML IJ SOLN
0.5000 mg | Freq: Once | INTRAMUSCULAR | Status: AC
  Administered 2012-01-21: 0.5 mg via INTRAVENOUS

## 2012-01-21 MED ORDER — SODIUM CHLORIDE 0.9 % IJ SOLN
3.0000 mL | Freq: Two times a day (BID) | INTRAMUSCULAR | Status: DC
  Administered 2012-01-21: 3 mL via INTRAVENOUS

## 2012-01-21 MED ORDER — LEVOTHYROXINE SODIUM 88 MCG PO TABS
88.0000 ug | ORAL_TABLET | Freq: Every day | ORAL | Status: DC
  Administered 2012-01-21 – 2012-01-22 (×2): 88 ug via ORAL
  Filled 2012-01-21 (×3): qty 1

## 2012-01-21 MED ORDER — SODIUM CHLORIDE 0.9 % IV SOLN: 250.0000 mL | INTRAVENOUS | Status: DC | PRN

## 2012-01-21 MED ORDER — SODIUM CHLORIDE 0.9 % IJ SOLN
3.0000 mL | INTRAMUSCULAR | Status: DC | PRN
  Administered 2012-01-21: 3 mL via INTRAVENOUS

## 2012-01-21 MED ORDER — TRAZODONE HCL 100 MG PO TABS
100.0000 mg | ORAL_TABLET | Freq: Every day | ORAL | Status: DC
  Administered 2012-01-21: 100 mg via ORAL
  Filled 2012-01-21 (×2): qty 1

## 2012-01-21 MED ORDER — HYDRALAZINE HCL 50 MG PO TABS
50.0000 mg | ORAL_TABLET | Freq: Four times a day (QID) | ORAL | Status: DC
  Administered 2012-01-21 – 2012-01-22 (×7): 50 mg via ORAL
  Filled 2012-01-21 (×9): qty 1

## 2012-01-21 MED ORDER — AMLODIPINE BESYLATE 10 MG PO TABS
10.0000 mg | ORAL_TABLET | Freq: Every day | ORAL | Status: DC
  Administered 2012-01-21 – 2012-01-22 (×2): 10 mg via ORAL
  Filled 2012-01-21 (×3): qty 1

## 2012-01-21 NOTE — Progress Notes (Signed)
[facility]             Progress Note   Subjective: Feels much better after emergent HD last night. No SOB. No current complaints.  Objective: Vital signs in last 24 hours: Temp:  [97.8 F (36.6 C)-99 F (37.2 C)] 99 F (37.2 C) (01/19 0316) Pulse Rate:  [58-79] 71  (01/19 0908) Resp:  [14-24] 18  (01/19 0316) BP: (135-230)/(68-103) 166/76 mmHg (01/19 0316) SpO2:  [91 %-100 %] 100 % (01/19 0908) Weight:  [59.1 kg (130 lb 4.7 oz)] 59.1 kg (130 lb 4.7 oz) (01/19 0316) Weight change:  Last BM Date: 01/21/12  Intake/Output from previous day: 01/18 0701 - 01/19 0700 In: -  Out: 3042      Physical Exam: General: Alert, awake, oriented x3, in no acute distress. HEENT: No bruits, no goiter. Heart: Regular rate and rhythm, without murmurs, rubs, gallops. Lungs: Clear to auscultation bilaterally. Abdomen: Soft, nontender, nondistended, positive bowel sounds. Extremities: No clubbing cyanosis or edema with positive pedal pulses. Neuro: Grossly intact, nonfocal.    Lab Results: Basic Metabolic Panel:  Basename 01/21/12 0700 01/20/12 1546  NA 139 138  K 3.7 5.4*  CL 100 101  CO2 30 26  GLUCOSE 79 75  BUN 9 21  CREATININE 4.44* 6.71*  CALCIUM 11.1* 11.5*  MG -- --  PHOS 4.3 --   Liver Function Tests:  Basename 01/21/12 0700 01/20/12 1546  AST -- 20  ALT -- 9  ALKPHOS -- 59  BILITOT -- 0.3  PROT -- 6.2  ALBUMIN 3.2* 3.2*   CBC:  Basename 01/21/12 0700 01/20/12 1546  WBC 2.8* 3.5*  NEUTROABS -- 1.5*  HGB 12.2 11.9*  HCT 37.3 37.7  MCV 81.6 83.8  PLT 85* 97*   Urine Drug Screen: Drugs of Abuse     Component Value Date/Time   LABOPIA POSITIVE* 02/21/2011 0615   COCAINSCRNUR NONE DETECTED 02/21/2011 0615   LABBENZ NONE DETECTED 02/21/2011 0615   AMPHETMU NONE DETECTED 02/21/2011 0615   THCU NONE DETECTED 02/21/2011 0615   LABBARB NONE DETECTED 02/21/2011 0615    Urinalysis:  Basename 01/20/12 1903  COLORURINE YELLOW  LABSPEC 1.010    PHURINE 8.5*  GLUCOSEU 100*  HGBUR SMALL*  BILIRUBINUR NEGATIVE  KETONESUR 15*  PROTEINUR 100*  UROBILINOGEN 0.2  NITRITE NEGATIVE  LEUKOCYTESUR NEGATIVE    Studies/Results: Dg Chest 2 View  01/20/2012  *RADIOLOGY REPORT*  Clinical Data: 67 year old female with dizziness and weakness.  CHEST - 2 VIEW  Comparison: 04/29/2011  Findings: Enlargement of the cardiopericardial silhouette is again noted. Mild pulmonary vascular congestion is present. There is no evidence of focal airspace disease, pulmonary edema, suspicious pulmonary nodule/mass, pleural effusion, or pneumothorax. No acute bony abnormalities are identified.  IMPRESSION: Enlargement of the cardiopericardial silhouette with mild pulmonary vascular congestion.   Original Report Authenticated By: Harmon Pier, M.D.    Ct Head Wo Contrast  01/20/2012  *RADIOLOGY REPORT*  Clinical Data: Imbalance.  No headache.  Hypertension.  End-stage renal disease.  CT HEAD WITHOUT CONTRAST  Technique:  Contiguous axial images were obtained from the base of the skull through the vertex without contrast.  Comparison: MRI brain 05/09/2011 most recent.  CT head most recent 05/08/2011.  Findings:  There is no evidence for acute infarction, intracranial hemorrhage, mass lesion, hydrocephalus, or extra-axial fluid.  Mild premature atrophy.  Moderate chronic microvascular ischemic change. Remote left posterior frontoparietal cortical and subcortical infarction.  Smaller remote left frontal infarct.  Bilateral thalamic lacunes.  Bilateral  cerebellar infarcts.  Calvarium intact.  Carotid atherosclerosis.  No sinus or mastoid disease.  IMPRESSION: Chronic changes as described.  No definite acute infarct or hemorrhage.   Original Report Authenticated By: Davonna Belling, M.D.     Medications: Scheduled Meds:    . amLODipine  10 mg Oral Daily  . carvedilol  25 mg Oral BID WC  . citalopram  20 mg Oral Daily  . hydrALAZINE  50 mg Oral QID  . levothyroxine  88 mcg  Oral QAC breakfast  . mometasone-formoterol  2 puff Inhalation BID  . multivitamin  1 tablet Oral Daily  . sodium chloride  3 mL Intravenous Q12H  . sodium chloride  3 mL Intravenous Q12H  . traZODone  100 mg Oral QHS   Continuous Infusions:  PRN Meds:.sodium chloride, albuterol, sodium chloride  Assessment/Plan:  Principal Problem:  *Acute respiratory failure with hypoxia Active Problems:  Essential hypertension, malignant  Chronic diastolic heart failure  ESRD  Encephalopathy acute  Epilepsy    Acute Hypoxemic Respiratory Failure -Likely early volume overload related pulmonary edema as she has rapidly improved with HD. -No longer has oxygen requirements.  HTN -BP improved with HD.  Hyperkalemia -Resolved.  Encephalopathy -Likely has some baseline dementia that has exacerbated by hypoxemia. -now back to baseline.  ESRD -HD MWF. -Received an extra session yesterday emergently.  Disposition -DC home in am after HD.     Time spent coordinating care: 35 minutes.   LOS: 1 day   [facility] Pager: [phone] 01/21/2012, 11:11 AM

## 2012-01-21 NOTE — Progress Notes (Addendum)
Hemodialysis-See Flowsheet Dr. Arlean Hopping paged about increasing anxiety. Pt states she feels so nervous/itchy and "cant calm down." Pt fidgeting and rolling over on access arm. Relaxation exercises used with no effect. Order to give benadryl 25mg  iv and then ativan if anxiety not subsided. Done.

## 2012-01-21 NOTE — Progress Notes (Signed)
Hemodialysis- Report given to RN on 2000. Pt vitals stable at end of treatment. Sats 100% on 2L, BP 155/71. Transferred in stable condition. Total UF in HD =3L.

## 2012-01-21 NOTE — Progress Notes (Signed)
Subjective: 3 kg off with HD last night, breathing much better. Was agitated during HD, maybe from Kayexalate given to her in ED she thinks.  "I feel much better today". BP better.   Objective Vital signs in last 24 hours: Filed Vitals:   01/21/12 0158 01/21/12 0200 01/21/12 0220 01/21/12 0316  BP: 136/88 149/78 155/71 166/76  Pulse: 76 78 76 79  Temp:   98.2 F (36.8 C) 99 F (37.2 C)  TempSrc:   Oral Oral  Resp:   15 18  Height:    5\' 3"  (1.6 m)  Weight:    59.1 kg (130 lb 4.7 oz)  SpO2: 100%  100% 99%   Weight change:   Intake/Output Summary (Last 24 hours) at 01/21/12 0854 Last data filed at 01/21/12 0220  Gross per 24 hour  Intake      0 ml  Output   3042 ml  Net  -3042 ml   Labs: Basic Metabolic Panel:  Lab 01/21/12 1610 01/20/12 1546  NA 139 138  K 3.7 5.4*  CL 100 101  CO2 30 26  GLUCOSE 79 75  BUN 9 21  CREATININE 4.44* 6.71*  ALB -- --  CALCIUM 11.1* 11.5*  PHOS 4.3 --   Liver Function Tests:  Lab 01/21/12 0700 01/20/12 1546  AST -- 20  ALT -- 9  ALKPHOS -- 59  BILITOT -- 0.3  PROT -- 6.2  ALBUMIN 3.2* 3.2*   No results found for this basename: LIPASE:3,AMYLASE:3 in the last 168 hours No results found for this basename: AMMONIA:3 in the last 168 hours CBC:  Lab 01/21/12 0700 01/20/12 1546  WBC 2.8* 3.5*  NEUTROABS -- 1.5*  HGB 12.2 11.9*  HCT 37.3 37.7  MCV 81.6 83.8  PLT 85* 97*   PT/INR: @labrcntip (inr:5) Cardiac Enzymes: No results found for this basename: CKTOTAL:5,CKMB:5,CKMBINDEX:5,TROPONINI:5 in the last 168 hours CBG: No results found for this basename: GLUCAP:5 in the last 168 hours  Iron Studies: No results found for this basename: IRON:30,TIBC:30,TRANSFERRIN:30,FERRITIN:30 in the last 168 hours  Physical Exam:  Blood pressure 166/76, pulse 79, temperature 99 F (37.2 C), temperature source Oral, resp. rate 18, height 5\' 3"  (1.6 m), weight 59.1 kg (130 lb 4.7 oz), SpO2 99.00%.  Gen: looks better, sitting up on side of bed    HEENT: EOMI, sclera anicteric, throat clear  Neck: no JVD, no LAN  Chest: clear bilat, no rales or wheezing  CV: regular, no rub or gallop, no murmur, no carotid bruits, pedal pulses intact  Abdomen: soft, nontender, no ascites or HSM, well-healed scars  Ext: no LE edema, no joint effusion or deformity, no gangrene or ulceration  Neuro: alert, Ox3, no focal deficit, no asterixis  Access: LUA AVGG with good thrill, no signs of infection on LUA AVG or old RUA AVG   Outpatient HD: MWF at [facility]   Impression/Plan  1. AMS- has underlying dementia most likely, suspect hypoxemia aggravating cause yesterday 2. Hypoxemia/vasc congestion- suspect volume related early pulm edema, doing better clinically today after 3kg off last night w HD 3. Hyperkalemia- resolved, will d/c telemetry 4. ESRD, mwf HD at [facility]. HD in am it still here. *Heparin allergy 5. HTN/volume- BP was quite high, better after HD 6. Anemia of CKD- get OP records, Hb 12 7. Secondary HPTH- Ca high, phos normal > cont renvela, low Ca bath w HD, get OP records 8. Hx CVA 9. Hx dementia/confusion 10. CAD w stent 11. Hx cardiac arrest/anoxic encephalopathy  in the past 12. Dispo- per primary   Vinson Moselle  MD [facility] [phone] pgr    [phone] cell 01/21/2012, 8:54 AM

## 2012-01-22 DIAGNOSIS — R0609 Other forms of dyspnea: Secondary | ICD-10-CM

## 2012-01-22 LAB — CBC
Hemoglobin: 12.2 g/dL (ref 12.0–15.0)
MCH: 26.2 pg (ref 26.0–34.0)
MCHC: 32.6 g/dL (ref 30.0–36.0)
Platelets: 130 10*3/uL — ABNORMAL LOW (ref 150–400)
RDW: 15.2 % (ref 11.5–15.5)

## 2012-01-22 LAB — URINE CULTURE

## 2012-01-22 LAB — RENAL FUNCTION PANEL
Albumin: 3.2 g/dL — ABNORMAL LOW (ref 3.5–5.2)
CO2: 26 mEq/L (ref 19–32)
Calcium: 11.8 mg/dL — ABNORMAL HIGH (ref 8.4–10.5)
GFR calc Af Amer: 6 mL/min — ABNORMAL LOW (ref >90)
GFR calc non Af Amer: 5 mL/min — ABNORMAL LOW (ref >90)
Phosphorus: 6 mg/dL — ABNORMAL HIGH (ref 2.3–4.6)
Sodium: 134 mEq/L — ABNORMAL LOW (ref 135–145)

## 2012-01-22 NOTE — Progress Notes (Signed)
I have personally attended this patient's dialysis session.  She is presently completing her treatment and holding sites. No issues during treatment.  [name]

## 2012-01-22 NOTE — Discharge Summary (Signed)
Physician Discharge Summary  Patient ID: [name] MRN: [id number] DOB/AGE: 1945-09-01 67 y.o.  Admit date: 01/20/2012 Discharge date: 01/22/2012  Primary Care Physician:  [name], MD   Discharge Diagnoses:    Principal Problem:  *Acute respiratory failure with hypoxia Active Problems:  Essential hypertension, malignant  Chronic diastolic heart failure  ESRD  Encephalopathy acute  Epilepsy      Medication List     As of 01/22/2012  1:13 PM    STOP taking these medications         diphenhydrAMINE 25 MG tablet   Commonly known as: BENADRYL      TAKE these medications         albuterol-ipratropium 18-103 MCG/ACT inhaler   Commonly known as: COMBIVENT   Inhale 2 puffs into the lungs every 6 (six) hours as needed. For shortness of breath      amLODipine 10 MG tablet   Commonly known as: NORVASC   Take 10 mg by mouth daily.      carvedilol 25 MG tablet   Commonly known as: COREG   Take 25 mg by mouth 2 (two) times daily with a meal.      citalopram 20 MG tablet   Commonly known as: CELEXA   Take 20 mg by mouth daily.      Fluticasone-Salmeterol 100-50 MCG/DOSE Aepb   Commonly known as: ADVAIR   Inhale 1 puff into the lungs every 12 (twelve) hours.      hydrALAZINE 50 MG tablet   Commonly known as: APRESOLINE   Take 1 tablet (50 mg total) by mouth 4 (four) times daily.      levothyroxine 88 MCG tablet   Commonly known as: SYNTHROID, LEVOTHROID   Take 88 mcg by mouth daily.      multivitamin Tabs tablet   Take 1 tablet by mouth daily.      NITROSTAT 0.4 MG SL tablet   Generic drug: nitroGLYCERIN   Place 0.4 mg under the tongue every 5 (five) minutes as needed. For chest pain.      polyethylene glycol powder powder   Commonly known as: GLYCOLAX/MIRALAX   Take 17 g by mouth daily as needed. For constipation.      pravastatin 40 MG tablet   Commonly known as: PRAVACHOL   Take 80 mg by mouth at bedtime.      sevelamer 800 MG tablet   Commonly known as: RENAGEL   Take 1,600-2,800 mg by mouth 3 (three) times daily with meals. Take 4 tablets in the morning, two tablets at noon, and 4 tablets at night      traZODone 50 MG tablet   Commonly known as: DESYREL   Take 100 mg by mouth at bedtime.         Disposition and Follow-up:  Will be discharged home today in stable condition.  Consults:  Renal, Dr. Arlean Hopping   Significant Diagnostic Studies:  Dg Chest 2 View  01/20/2012  *RADIOLOGY REPORT*  Clinical Data: 67 year old female with dizziness and weakness.  CHEST - 2 VIEW  Comparison: 04/29/2011  Findings: Enlargement of the cardiopericardial silhouette is again noted. Mild pulmonary vascular congestion is present. There is no evidence of focal airspace disease, pulmonary edema, suspicious pulmonary nodule/mass, pleural effusion, or pneumothorax. No acute bony abnormalities are identified.  IMPRESSION: Enlargement of the cardiopericardial silhouette with mild pulmonary vascular congestion.   Original Report Authenticated By: Harmon Pier, M.D.    Ct Head Wo Contrast  01/20/2012  *RADIOLOGY REPORT*  Clinical Data: Imbalance.  No headache.  Hypertension.  End-stage renal disease.  CT HEAD WITHOUT CONTRAST  Technique:  Contiguous axial images were obtained from the base of the skull through the vertex without contrast.  Comparison: MRI brain 05/09/2011 most recent.  CT head most recent 05/08/2011.  Findings:  There is no evidence for acute infarction, intracranial hemorrhage, mass lesion, hydrocephalus, or extra-axial fluid.  Mild premature atrophy.  Moderate chronic microvascular ischemic change. Remote left posterior frontoparietal cortical and subcortical infarction.  Smaller remote left frontal infarct.  Bilateral thalamic lacunes.  Bilateral cerebellar infarcts.  Calvarium intact.  Carotid atherosclerosis.  No sinus or mastoid disease.  IMPRESSION: Chronic changes as described.  No definite acute infarct or hemorrhage.   Original  Report Authenticated By: Davonna Belling, M.D.     Brief H and P: For complete details please refer to admission H and P, but in brief patient is a 67 y/o woman with PMH significant for ESRD on HD MWF, who was brought in by family for confusion and hypoxemia. We were asked to admit her for further evaluation and management.    Hospital Course:  Principal Problem:  *Acute respiratory failure with hypoxia Active Problems:  Essential hypertension, malignant  Chronic diastolic heart failure  ESRD  Encephalopathy acute  Epilepsy    Acute Hypoxemic Respiratory Failure  -Likely early volume overload related pulmonary edema as she has rapidly improved with HD.  -No longer has oxygen requirements.   HTN  -BP improved with HD.   Hyperkalemia  -Resolved.   Encephalopathy  -Likely has some baseline dementia that has exacerbated by hypoxemia.  -now back to baseline.   ESRD  -HD MWF.  -Received an extra session  Emergently and will dialyze today to put her back on schedule, after which she can go home.   Time spent on Discharge: Greater than 30 minutes.  SignedChaya Jan [facility] Pager: [phone] 01/22/2012, 1:13 PM

## 2012-01-22 NOTE — Progress Notes (Signed)
Subjective:  Anxious to get to HD so can get home Due to go next round Says slept well  Objective:   Lying flat on no oxygen  Vital signs in last 24 hours: Filed Vitals:   01/22/12 0121 01/22/12 0346 01/22/12 0616 01/22/12 0846  BP: 200/78 170/64 162/76   Pulse: 80 72 72   Temp: 97.9 F (36.6 C)  98.5 F (36.9 C)   TempSrc:      Resp:   18   Height:      Weight:      SpO2: 100% 100% 100% 98%   Weight change:   Intake/Output Summary (Last 24 hours) at 01/22/12 1138 Last data filed at 01/22/12 0500  Gross per 24 hour  Intake    120 ml  Output      0 ml  Net    120 ml    Physical Exam:  Blood pressure 162/76, pulse 72, temperature 98.5 F (36.9 C), temperature source Oral, resp. rate 18, height 5\' 3"  (1.6 m), weight 59.1 kg (130 lb 4.7 oz), SpO2 98.00%. Lying flat/no JVD/no oxygen Lungs clear to auscultation Regular S1S2 no S3 Abdomen soft.  No focal tenderness Large midline scar No edema of the lower extremities Left upper AVG with bruit and thrill  Labs:   Lab 01/21/12 0700 01/20/12 1546  NA 139 138  K 3.7 5.4*  CL 100 101  CO2 30 26  GLUCOSE 79 75  BUN 9 21  CREATININE 4.44* 6.71*  ALB -- --  CALCIUM 11.1* 11.5*  PHOS 4.3 --     Lab 01/21/12 0700 01/20/12 1546  AST -- 20  ALT -- 9  ALKPHOS -- 59  BILITOT -- 0.3  PROT -- 6.2  ALBUMIN 3.2* 3.2*   No results found for this basename: LIPASE:3,AMYLASE:3 in the last 168 hours No results found for this basename: AMMONIA:3 in the last 168 hours   Lab 01/21/12 0700 01/20/12 1546  WBC 2.8* 3.5*  NEUTROABS -- 1.5*  HGB 12.2 11.9*  HCT 37.3 37.7  MCV 81.6 83.8  PLT 85* 97*    No results found for this basename: CKTOTAL:5,CKMB:5,CKMBINDEX:5,TROPONINI:5 in the last 168 hours  No results found for this basename: GLUCAP:5 in the last 168 hours   No results found for this basename: IRON:30,TIBC:30,TRANSFERRIN:30,FERRITIN:30 in the last 168 hours  Studies/Results: Dg Chest 2 View  01/20/2012   *RADIOLOGY REPORT*  Clinical Data: 67 year old female with dizziness and weakness.  CHEST - 2 VIEW  Comparison: 04/29/2011  Findings: Enlargement of the cardiopericardial silhouette is again noted. Mild pulmonary vascular congestion is present. There is no evidence of focal airspace disease, pulmonary edema, suspicious pulmonary nodule/mass, pleural effusion, or pneumothorax. No acute bony abnormalities are identified.  IMPRESSION: Enlargement of the cardiopericardial silhouette with mild pulmonary vascular congestion.   Original Report Authenticated By: Harmon Pier, M.D.    Ct Head Wo Contrast  01/20/2012  *RADIOLOGY REPORT*  Clinical Data: Imbalance.  No headache.  Hypertension.  End-stage renal disease.  CT HEAD WITHOUT CONTRAST  Technique:  Contiguous axial images were obtained from the base of the skull through the vertex without contrast.  Comparison: MRI brain 05/09/2011 most recent.  CT head most recent 05/08/2011.  Findings:  There is no evidence for acute infarction, intracranial hemorrhage, mass lesion, hydrocephalus, or extra-axial fluid.  Mild premature atrophy.  Moderate chronic microvascular ischemic change. Remote left posterior frontoparietal cortical and subcortical infarction.  Smaller remote left frontal infarct.  Bilateral thalamic lacunes.  Bilateral cerebellar  infarcts.  Calvarium intact.  Carotid atherosclerosis.  No sinus or mastoid disease.  IMPRESSION: Chronic changes as described.  No definite acute infarct or hemorrhage.   Original Report Authenticated By: Davonna Belling, M.D.          . amLODipine  10 mg Oral Daily  . carvedilol  25 mg Oral BID WC  . citalopram  20 mg Oral Daily  . hydrALAZINE  50 mg Oral QID  . levothyroxine  88 mcg Oral QAC breakfast  . mometasone-formoterol  2 puff Inhalation BID  . multivitamin  1 tablet Oral Daily  . sodium chloride  3 mL Intravenous Q12H  . sodium chloride  3 mL Intravenous Q12H  . traZODone  100 mg Oral QHS     I  have reviewed  scheduled and prn medications.  Outpatient HD: MWF at [facility]  EDW 63.5 (last TMT went out at 63.5)  Bath 2K 2Ca Flow rates BFR 350 DFR a1.5 Epo on hold Hectorol on hold Venofer none  Bath temp 37 Allergic to heparin    ASSESSMENT/RECOMMENDATIONS 1. AMS- has underlying dementia most likely, suspect hypoxemia aggravating cause at time of admission 2. Hypoxemia/vasc congestion- suspect volume related early pulm edema, doing better clinically today after 3kg off  w HD; for HD again today to get on schedule DRY WEIGHT WILL BE MUCH LOWER - IF SCALES CORRECT SHE IS 4.5 KG UNDER OLD EDW - REWEIGH AT HD - NO RALES AND NO EDEMA ON CURRENT EXAM. 3. Hyperkalemia- resolved  4. ESRD, mwf HD at [facility]. HD TODAY TO KEEP ON SCHEDULE. *Heparin allergy 5. HTN/volume- BP was quite high, better after HD 6. Anemia of CKD -  Hb 12 - epo ON HOLD 7. Secondary HPTH- Ca high, phos normal > cont renvela, low Ca bath w HD, HECTOROL ON HOLD 8. Hx CVA 9. Hx dementia/confusion 10. CAD w stent 11. Hx cardiac arrest/anoxic encephalopathy in the past 12. Dispo- per primary - ANTICIPATE D/C AFTER HD TODAY WITH A NEW EDW   Camille Bal, MD [facility] [phone] Pager 01/22/2012, 11:38 AM

## 2012-01-22 NOTE — Progress Notes (Signed)
Nutrition Brief Note  Patient identified on the Malnutrition Screening Tool (MST) Report  Body mass index is 23.08 kg/(m^2). Patient meets criteria for WNL based on current BMI.  UBW 62 kg per pt.  Current diet order is renal, patient is consuming poor intake  of meals at this time. Labs and medications reviewed.   Patient reports poor appetite and intake.  Stopped drinking Nepro a couple of months ago because "they said I didn't need it".  Patient to follow renal diet and states she is aware of guidelines.  Encouraged intake and Nepro if needed if appetite/intake do not return after d/c.   Per chart, patient for d/c after HD today.  No nutrition interventions warranted at this time. If nutrition issues arise, please consult RD.   Oran Rein, RD, LDN Clinical Inpatient Dietitian Pager:  [phone] Weekend and after hours pager:  [phone]

## 2012-01-24 DIAGNOSIS — N189 Chronic kidney disease, unspecified: Secondary | ICD-10-CM | POA: Diagnosis not present

## 2012-01-24 DIAGNOSIS — E1129 Type 2 diabetes mellitus with other diabetic kidney complication: Secondary | ICD-10-CM | POA: Diagnosis not present

## 2012-01-25 NOTE — ED Provider Notes (Signed)
Medical screening examination/treatment/procedure(s) were performed by non-physician practitioner and as supervising physician I was immediately available for consultation/collaboration.  Toy Baker, MD 01/25/12 1331

## 2012-01-31 ENCOUNTER — Telehealth: Payer: Self-pay | Admitting: Radiology

## 2012-01-31 ENCOUNTER — Other Ambulatory Visit: Payer: Self-pay | Admitting: Radiology

## 2012-01-31 ENCOUNTER — Other Ambulatory Visit: Payer: Self-pay | Admitting: Interventional Radiology

## 2012-01-31 DIAGNOSIS — N2889 Other specified disorders of kidney and ureter: Secondary | ICD-10-CM

## 2012-01-31 NOTE — Progress Notes (Signed)
13 hr prep called to [facility], [address]     Rock Nephew instructed to obtain Rx prior to CT scheduled for 02/29/2012 & take as directed.

## 2012-02-01 NOTE — Telephone Encounter (Signed)
13 hr prep called to [facility] on [address].

## 2012-02-02 DIAGNOSIS — N186 End stage renal disease: Secondary | ICD-10-CM | POA: Diagnosis not present

## 2012-02-05 DIAGNOSIS — D509 Iron deficiency anemia, unspecified: Secondary | ICD-10-CM | POA: Diagnosis not present

## 2012-02-05 DIAGNOSIS — D631 Anemia in chronic kidney disease: Secondary | ICD-10-CM | POA: Diagnosis not present

## 2012-02-05 DIAGNOSIS — N2581 Secondary hyperparathyroidism of renal origin: Secondary | ICD-10-CM | POA: Diagnosis not present

## 2012-02-05 DIAGNOSIS — N186 End stage renal disease: Secondary | ICD-10-CM | POA: Diagnosis not present

## 2012-02-08 ENCOUNTER — Other Ambulatory Visit (HOSPITAL_COMMUNITY): Payer: Self-pay | Admitting: Nephrology

## 2012-02-08 DIAGNOSIS — N186 End stage renal disease: Secondary | ICD-10-CM

## 2012-02-13 ENCOUNTER — Ambulatory Visit (HOSPITAL_COMMUNITY)
Admission: RE | Admit: 2012-02-13 | Discharge: 2012-02-13 | Disposition: A | Discharge status: Home / Self Care | Payer: Medicare Other | Source: Ambulatory Visit | Attending: Nephrology | Admitting: Nephrology

## 2012-02-13 ENCOUNTER — Other Ambulatory Visit (HOSPITAL_COMMUNITY): Payer: Self-pay | Admitting: Nephrology

## 2012-02-13 DIAGNOSIS — Z992 Dependence on renal dialysis: Secondary | ICD-10-CM | POA: Insufficient documentation

## 2012-02-13 DIAGNOSIS — J449 Chronic obstructive pulmonary disease, unspecified: Secondary | ICD-10-CM | POA: Insufficient documentation

## 2012-02-13 DIAGNOSIS — Y832 Surgical operation with anastomosis, bypass or graft as the cause of abnormal reaction of the patient, or of later complication, without mention of misadventure at the time of the procedure: Secondary | ICD-10-CM | POA: Insufficient documentation

## 2012-02-13 DIAGNOSIS — I120 Hypertensive chronic kidney disease with stage 5 chronic kidney disease or end stage renal disease: Secondary | ICD-10-CM | POA: Diagnosis not present

## 2012-02-13 DIAGNOSIS — N186 End stage renal disease: Secondary | ICD-10-CM | POA: Diagnosis not present

## 2012-02-13 DIAGNOSIS — I871 Compression of vein: Secondary | ICD-10-CM | POA: Diagnosis not present

## 2012-02-13 DIAGNOSIS — I5030 Unspecified diastolic (congestive) heart failure: Secondary | ICD-10-CM | POA: Diagnosis not present

## 2012-02-13 DIAGNOSIS — I509 Heart failure, unspecified: Secondary | ICD-10-CM | POA: Insufficient documentation

## 2012-02-13 DIAGNOSIS — T82898A Other specified complication of vascular prosthetic devices, implants and grafts, initial encounter: Secondary | ICD-10-CM | POA: Insufficient documentation

## 2012-02-13 DIAGNOSIS — M2040 Other hammer toe(s) (acquired), unspecified foot: Secondary | ICD-10-CM | POA: Diagnosis not present

## 2012-02-13 DIAGNOSIS — J4489 Other specified chronic obstructive pulmonary disease: Secondary | ICD-10-CM | POA: Insufficient documentation

## 2012-02-13 MED ORDER — IOHEXOL 300 MG/ML  SOLN
100.0000 mL | Freq: Once | INTRAMUSCULAR | Status: AC | PRN
  Administered 2012-02-13: 50 mL via INTRAVENOUS

## 2012-02-13 NOTE — Procedures (Signed)
LUE venous PTA 6 mm No comp

## 2012-02-13 NOTE — H&P (Signed)
[name] is an 67 y.o. female.   Chief Complaint: Increased venous pressure HPI: Graft malfunction  Past Medical History  Diagnosis Date  . CAD (coronary artery disease)     stent to RCA  . CVA (cerebral infarction) 2003    no apparent residual  . Hypothyroidism   . PVD (peripheral vascular disease)   . Hyperlipidemia   . Positive PPD     completed rifampin  . Diastolic congestive heart failure   . Hypertension   . COPD (chronic obstructive pulmonary disease) t  . Cancer     clear cell cancer, kidney  . Complication of anesthesia 12/2010    pt is very confused, with AMS with anesthesia  . CVA 07/27/2008    CVA affected cognition and memory per family, no focal deficits.    . ESRD 07/27/2008    ESRD due to HTN and NSAID's, started hemodialysis in 2005 in [place], [place]. Went to [facility] from 2010 to 2012 and since 2012 has been getting dialysis at [facility] on [address] in [place] on a MWF schedule. First access with RUA AVG placed in [place]. Next and current access was LUA AVG placed by Dr. Wyn Quaker in [place] in or around 2012. Has had 2 or 3 procedures on that graft since placed per family. She gets her access work done here in [place] now. She is allergic to heparin and does not get any heparin at dialysis; she had an allergic reaction apparently when in ICU in the past.        Past Surgical History  Procedure Laterality Date  . Abdominal hysterectomy    . Cholecystectomy    . D&cs    . Right ankle repair    . Left nephrectomy    . Vascular surgery  11/2010    graft inserted to left arm    Family History  Problem Relation Age of Onset  . Heart disease Father   . Hypertension Mother   . Dementia Mother   . Coronary artery disease Sister    Social History:  reports that she has been smoking Cigarettes.  She has a 106 pack-year smoking history. She has never used smokeless tobacco. She reports that she does not drink alcohol or use illicit  drugs.  Allergies:  Allergies  Allergen Reactions  . Iohexol Swelling and Other (See Comments)    1970s; passed out and had facial/tongue swelling.  Requires 13-hour prep with prednisone and benadryl  . Ampicillin Hives and Rash  . Meperidine Hcl Swelling and Rash    Makes tongue swell  . Morphine Rash  . Penicillins Rash  . Valproic Acid And Related Other (See Comments)    Confusion   . Heparin     MDs told her not to take after reaction in ICU  . Pentazocine Lactate     Patient does not remember reaction to this med ([place]win).   . Phenytoin Other (See Comments)    Had reaction while in ICU; doesn't know.  MDs told her not to take ever again.  . Wellbutrin (Bupropion)     sizures  . Ace Inhibitors Rash     (Not in a hospital admission)  No results found for this or any previous visit (from the past 48 hour(s)). No results found.  ROS  There were no vitals taken for this visit. Physical Exam  Constitutional: She is oriented to person, place, and time. She appears well-developed and well-nourished.  HENT:  Head: Normocephalic and atraumatic.  Neck: Normal range of motion.  Cardiovascular: Normal rate, regular rhythm and normal heart sounds.   Respiratory: Effort normal and breath sounds normal.  Neurological: She is alert and oriented to person, place, and time.  Skin: Skin is warm and dry.     Assessment/Plan Vensous angioplasty  [name],ART A 02/13/2012, 8:17 AM

## 2012-02-20 ENCOUNTER — Ambulatory Visit (HOSPITAL_COMMUNITY): Admission: RE | Admit: 2012-02-20 | Payer: Medicare Other | Source: Ambulatory Visit

## 2012-02-27 ENCOUNTER — Other Ambulatory Visit: Payer: Self-pay | Admitting: Emergency Medicine

## 2012-02-27 NOTE — Telephone Encounter (Signed)
CALLED IN ANOTHER RX FOR PREDNISONE 50 MG 13, 7, 1 HR PRIOR TO CT SCAN ON THURS. PT STATED THAT SHE HAS OTC BENADRYL AT HOME TO TAKE 50 MG 1 HR PRIOR TO CT SCAN.  LMOVM AT 12:35PM FOR PHARMACY TO REFILL RX.

## 2012-02-29 ENCOUNTER — Ambulatory Visit (HOSPITAL_COMMUNITY)
Admission: RE | Admit: 2012-02-29 | Discharge: 2012-02-29 | Disposition: A | Discharge status: Home / Self Care | Payer: Medicare Other | Source: Ambulatory Visit | Attending: Interventional Radiology | Admitting: Interventional Radiology

## 2012-02-29 ENCOUNTER — Ambulatory Visit
Admission: RE | Admit: 2012-02-29 | Discharge: 2012-02-29 | Disposition: A | Discharge status: Home / Self Care | Payer: Medicare Other | Source: Ambulatory Visit | Attending: Interventional Radiology | Admitting: Interventional Radiology

## 2012-02-29 DIAGNOSIS — N289 Disorder of kidney and ureter, unspecified: Secondary | ICD-10-CM | POA: Insufficient documentation

## 2012-02-29 DIAGNOSIS — Z992 Dependence on renal dialysis: Secondary | ICD-10-CM | POA: Insufficient documentation

## 2012-02-29 DIAGNOSIS — N2889 Other specified disorders of kidney and ureter: Secondary | ICD-10-CM

## 2012-02-29 DIAGNOSIS — C649 Malignant neoplasm of unspecified kidney, except renal pelvis: Secondary | ICD-10-CM | POA: Diagnosis not present

## 2012-02-29 DIAGNOSIS — Q619 Cystic kidney disease, unspecified: Secondary | ICD-10-CM | POA: Insufficient documentation

## 2012-02-29 DIAGNOSIS — Z905 Acquired absence of kidney: Secondary | ICD-10-CM | POA: Diagnosis not present

## 2012-02-29 DIAGNOSIS — N281 Cyst of kidney, acquired: Secondary | ICD-10-CM | POA: Diagnosis not present

## 2012-02-29 MED ORDER — IOHEXOL 300 MG/ML  SOLN
100.0000 mL | Freq: Once | INTRAMUSCULAR | Status: AC | PRN
  Administered 2012-02-29: 100 mL via INTRAVENOUS

## 2012-02-29 NOTE — Progress Notes (Signed)
Denies hematuria or any other problems with urination.  States that she has been experiencing discomfort of LLQ & feels this is related to adhesions from Left radical nephrectomy.    Dialysis schedule:  M, W, & Fr.

## 2012-03-01 DIAGNOSIS — N186 End stage renal disease: Secondary | ICD-10-CM | POA: Diagnosis not present

## 2012-03-04 DIAGNOSIS — D509 Iron deficiency anemia, unspecified: Secondary | ICD-10-CM | POA: Diagnosis not present

## 2012-03-04 DIAGNOSIS — N186 End stage renal disease: Secondary | ICD-10-CM | POA: Diagnosis not present

## 2012-03-04 DIAGNOSIS — N2581 Secondary hyperparathyroidism of renal origin: Secondary | ICD-10-CM | POA: Diagnosis not present

## 2012-03-04 DIAGNOSIS — D631 Anemia in chronic kidney disease: Secondary | ICD-10-CM | POA: Diagnosis not present

## 2012-03-04 NOTE — Progress Notes (Signed)
Patient ID: [name], female   DOB: [birth year], 67 y.o.   MRN: [id number]  ESTABLISHED PATIENT OFFICE VISIT  Chief Complaint: Follow-up of complex cystic lesions of the right kidney.  History: [name] returns for followup and was last seen on 08/29/2011. She remains asymptomatic with respect to the right kidney. She states that she occasionally has some mild left-sided abdominal pain after prior left nephrectomy in 2011. She is status post angioplasty of the venous anastomosis of a left arm dialysis graft on 02/13/2012. She remains on chronic hemodialysis. She was admitted to the hospital in late January for acute hypoxemic respiratory failure which improved with hemodialysis.  Review of Systems: No hematuria or dysuria. No right-sided flank pain. No fever or chills.  Exam: Vital signs: Blood pressure 154/76, pulse 83, respirations 14, temperature 98.2, oxygen saturation 100% on room air. General: No acute distress. Abdomen: Soft and nontender.  Imaging: Follow-up CT was performed today with without contrast. This shows stable complex cysts of the remaining solitary and atrophic right kidney. The only a complex cyst that demonstrates enhancement is a 2 cm upper pole cyst which appears stable when compared to the prior CT last August.  Assessment and Plan: Stable complex cyst of the right kidney. I have recommended continued surveillance with no intervention at this time. The patient was supposed to schedule a follow up appointment with Dr. Vernie Ammons but forgot to do so. I recommended that she make an appointment to see him for continued opinion about management of the right kidney and followup after prior left nephrectomy. I recommended that we reimage the right kidney in approximately a year.

## 2012-03-07 DIAGNOSIS — D4959 Neoplasm of unspecified behavior of other genitourinary organ: Secondary | ICD-10-CM | POA: Diagnosis not present

## 2012-03-07 DIAGNOSIS — Z85528 Personal history of other malignant neoplasm of kidney: Secondary | ICD-10-CM | POA: Diagnosis not present

## 2012-03-07 DIAGNOSIS — N281 Cyst of kidney, acquired: Secondary | ICD-10-CM | POA: Diagnosis not present

## 2012-04-01 DIAGNOSIS — N186 End stage renal disease: Secondary | ICD-10-CM | POA: Diagnosis not present

## 2012-04-03 DIAGNOSIS — N2581 Secondary hyperparathyroidism of renal origin: Secondary | ICD-10-CM | POA: Diagnosis not present

## 2012-04-03 DIAGNOSIS — D631 Anemia in chronic kidney disease: Secondary | ICD-10-CM | POA: Diagnosis not present

## 2012-04-03 DIAGNOSIS — D509 Iron deficiency anemia, unspecified: Secondary | ICD-10-CM | POA: Diagnosis not present

## 2012-04-03 DIAGNOSIS — E8779 Other fluid overload: Secondary | ICD-10-CM | POA: Diagnosis not present

## 2012-04-03 DIAGNOSIS — N186 End stage renal disease: Secondary | ICD-10-CM | POA: Diagnosis not present

## 2012-04-24 DIAGNOSIS — E1029 Type 1 diabetes mellitus with other diabetic kidney complication: Secondary | ICD-10-CM | POA: Diagnosis not present

## 2012-04-24 DIAGNOSIS — N189 Chronic kidney disease, unspecified: Secondary | ICD-10-CM | POA: Diagnosis not present

## 2012-05-02 DIAGNOSIS — L84 Corns and callosities: Secondary | ICD-10-CM | POA: Diagnosis not present

## 2012-05-02 DIAGNOSIS — M2040 Other hammer toe(s) (acquired), unspecified foot: Secondary | ICD-10-CM | POA: Diagnosis not present

## 2012-05-03 DIAGNOSIS — D509 Iron deficiency anemia, unspecified: Secondary | ICD-10-CM | POA: Diagnosis not present

## 2012-05-03 DIAGNOSIS — D631 Anemia in chronic kidney disease: Secondary | ICD-10-CM | POA: Diagnosis not present

## 2012-05-03 DIAGNOSIS — N186 End stage renal disease: Secondary | ICD-10-CM | POA: Diagnosis not present

## 2012-05-03 DIAGNOSIS — N2581 Secondary hyperparathyroidism of renal origin: Secondary | ICD-10-CM | POA: Diagnosis not present

## 2012-05-07 DIAGNOSIS — D509 Iron deficiency anemia, unspecified: Secondary | ICD-10-CM | POA: Diagnosis not present

## 2012-05-07 DIAGNOSIS — N2581 Secondary hyperparathyroidism of renal origin: Secondary | ICD-10-CM | POA: Diagnosis not present

## 2012-05-07 DIAGNOSIS — N186 End stage renal disease: Secondary | ICD-10-CM | POA: Diagnosis not present

## 2012-05-07 DIAGNOSIS — D631 Anemia in chronic kidney disease: Secondary | ICD-10-CM | POA: Diagnosis not present

## 2012-05-08 DIAGNOSIS — D631 Anemia in chronic kidney disease: Secondary | ICD-10-CM | POA: Diagnosis not present

## 2012-05-08 DIAGNOSIS — N2581 Secondary hyperparathyroidism of renal origin: Secondary | ICD-10-CM | POA: Diagnosis not present

## 2012-05-08 DIAGNOSIS — D509 Iron deficiency anemia, unspecified: Secondary | ICD-10-CM | POA: Diagnosis not present

## 2012-05-08 DIAGNOSIS — N186 End stage renal disease: Secondary | ICD-10-CM | POA: Diagnosis not present

## 2012-05-10 DIAGNOSIS — N186 End stage renal disease: Secondary | ICD-10-CM | POA: Diagnosis not present

## 2012-05-10 DIAGNOSIS — D631 Anemia in chronic kidney disease: Secondary | ICD-10-CM | POA: Diagnosis not present

## 2012-05-10 DIAGNOSIS — D509 Iron deficiency anemia, unspecified: Secondary | ICD-10-CM | POA: Diagnosis not present

## 2012-05-10 DIAGNOSIS — N2581 Secondary hyperparathyroidism of renal origin: Secondary | ICD-10-CM | POA: Diagnosis not present

## 2012-05-13 DIAGNOSIS — D509 Iron deficiency anemia, unspecified: Secondary | ICD-10-CM | POA: Diagnosis not present

## 2012-05-13 DIAGNOSIS — N2581 Secondary hyperparathyroidism of renal origin: Secondary | ICD-10-CM | POA: Diagnosis not present

## 2012-05-13 DIAGNOSIS — N186 End stage renal disease: Secondary | ICD-10-CM | POA: Diagnosis not present

## 2012-05-13 DIAGNOSIS — N039 Chronic nephritic syndrome with unspecified morphologic changes: Secondary | ICD-10-CM | POA: Diagnosis not present

## 2012-05-15 DIAGNOSIS — N186 End stage renal disease: Secondary | ICD-10-CM | POA: Diagnosis not present

## 2012-05-15 DIAGNOSIS — N2581 Secondary hyperparathyroidism of renal origin: Secondary | ICD-10-CM | POA: Diagnosis not present

## 2012-05-15 DIAGNOSIS — D631 Anemia in chronic kidney disease: Secondary | ICD-10-CM | POA: Diagnosis not present

## 2012-05-15 DIAGNOSIS — D509 Iron deficiency anemia, unspecified: Secondary | ICD-10-CM | POA: Diagnosis not present

## 2012-05-17 DIAGNOSIS — N039 Chronic nephritic syndrome with unspecified morphologic changes: Secondary | ICD-10-CM | POA: Diagnosis not present

## 2012-05-17 DIAGNOSIS — N2581 Secondary hyperparathyroidism of renal origin: Secondary | ICD-10-CM | POA: Diagnosis not present

## 2012-05-17 DIAGNOSIS — N186 End stage renal disease: Secondary | ICD-10-CM | POA: Diagnosis not present

## 2012-05-17 DIAGNOSIS — D509 Iron deficiency anemia, unspecified: Secondary | ICD-10-CM | POA: Diagnosis not present

## 2012-05-20 DIAGNOSIS — N2581 Secondary hyperparathyroidism of renal origin: Secondary | ICD-10-CM | POA: Diagnosis not present

## 2012-05-20 DIAGNOSIS — N186 End stage renal disease: Secondary | ICD-10-CM | POA: Diagnosis not present

## 2012-05-20 DIAGNOSIS — N039 Chronic nephritic syndrome with unspecified morphologic changes: Secondary | ICD-10-CM | POA: Diagnosis not present

## 2012-05-20 DIAGNOSIS — D509 Iron deficiency anemia, unspecified: Secondary | ICD-10-CM | POA: Diagnosis not present

## 2012-05-21 DIAGNOSIS — D509 Iron deficiency anemia, unspecified: Secondary | ICD-10-CM | POA: Diagnosis not present

## 2012-05-21 DIAGNOSIS — N186 End stage renal disease: Secondary | ICD-10-CM | POA: Diagnosis not present

## 2012-05-21 DIAGNOSIS — N039 Chronic nephritic syndrome with unspecified morphologic changes: Secondary | ICD-10-CM | POA: Diagnosis not present

## 2012-05-21 DIAGNOSIS — N2581 Secondary hyperparathyroidism of renal origin: Secondary | ICD-10-CM | POA: Diagnosis not present

## 2012-05-24 DIAGNOSIS — D509 Iron deficiency anemia, unspecified: Secondary | ICD-10-CM | POA: Diagnosis not present

## 2012-05-24 DIAGNOSIS — N2581 Secondary hyperparathyroidism of renal origin: Secondary | ICD-10-CM | POA: Diagnosis not present

## 2012-05-24 DIAGNOSIS — D631 Anemia in chronic kidney disease: Secondary | ICD-10-CM | POA: Diagnosis not present

## 2012-05-24 DIAGNOSIS — N186 End stage renal disease: Secondary | ICD-10-CM | POA: Diagnosis not present

## 2012-05-27 DIAGNOSIS — N186 End stage renal disease: Secondary | ICD-10-CM | POA: Diagnosis not present

## 2012-05-27 DIAGNOSIS — D509 Iron deficiency anemia, unspecified: Secondary | ICD-10-CM | POA: Diagnosis not present

## 2012-05-27 DIAGNOSIS — D631 Anemia in chronic kidney disease: Secondary | ICD-10-CM | POA: Diagnosis not present

## 2012-05-27 DIAGNOSIS — N2581 Secondary hyperparathyroidism of renal origin: Secondary | ICD-10-CM | POA: Diagnosis not present

## 2012-05-29 DIAGNOSIS — D509 Iron deficiency anemia, unspecified: Secondary | ICD-10-CM | POA: Diagnosis not present

## 2012-05-29 DIAGNOSIS — N039 Chronic nephritic syndrome with unspecified morphologic changes: Secondary | ICD-10-CM | POA: Diagnosis not present

## 2012-05-29 DIAGNOSIS — N186 End stage renal disease: Secondary | ICD-10-CM | POA: Diagnosis not present

## 2012-05-29 DIAGNOSIS — N2581 Secondary hyperparathyroidism of renal origin: Secondary | ICD-10-CM | POA: Diagnosis not present

## 2012-05-31 DIAGNOSIS — N186 End stage renal disease: Secondary | ICD-10-CM | POA: Diagnosis not present

## 2012-05-31 DIAGNOSIS — D631 Anemia in chronic kidney disease: Secondary | ICD-10-CM | POA: Diagnosis not present

## 2012-05-31 DIAGNOSIS — N2581 Secondary hyperparathyroidism of renal origin: Secondary | ICD-10-CM | POA: Diagnosis not present

## 2012-06-01 DIAGNOSIS — N186 End stage renal disease: Secondary | ICD-10-CM | POA: Diagnosis not present

## 2012-06-03 DIAGNOSIS — N186 End stage renal disease: Secondary | ICD-10-CM | POA: Diagnosis not present

## 2012-06-03 DIAGNOSIS — N2581 Secondary hyperparathyroidism of renal origin: Secondary | ICD-10-CM | POA: Diagnosis not present

## 2012-06-03 DIAGNOSIS — D509 Iron deficiency anemia, unspecified: Secondary | ICD-10-CM | POA: Diagnosis not present

## 2012-06-03 DIAGNOSIS — D631 Anemia in chronic kidney disease: Secondary | ICD-10-CM | POA: Diagnosis not present

## 2012-06-05 DIAGNOSIS — N186 End stage renal disease: Secondary | ICD-10-CM | POA: Diagnosis not present

## 2012-06-05 DIAGNOSIS — N039 Chronic nephritic syndrome with unspecified morphologic changes: Secondary | ICD-10-CM | POA: Diagnosis not present

## 2012-06-05 DIAGNOSIS — N2581 Secondary hyperparathyroidism of renal origin: Secondary | ICD-10-CM | POA: Diagnosis not present

## 2012-06-05 DIAGNOSIS — D509 Iron deficiency anemia, unspecified: Secondary | ICD-10-CM | POA: Diagnosis not present

## 2012-06-06 ENCOUNTER — Telehealth: Payer: Self-pay | Admitting: Neurology

## 2012-06-06 NOTE — Telephone Encounter (Signed)
Appointment scheduled for 08-08-12

## 2012-06-07 DIAGNOSIS — N186 End stage renal disease: Secondary | ICD-10-CM | POA: Diagnosis not present

## 2012-06-07 DIAGNOSIS — N2581 Secondary hyperparathyroidism of renal origin: Secondary | ICD-10-CM | POA: Diagnosis not present

## 2012-06-07 DIAGNOSIS — D509 Iron deficiency anemia, unspecified: Secondary | ICD-10-CM | POA: Diagnosis not present

## 2012-06-07 DIAGNOSIS — D631 Anemia in chronic kidney disease: Secondary | ICD-10-CM | POA: Diagnosis not present

## 2012-06-10 DIAGNOSIS — N186 End stage renal disease: Secondary | ICD-10-CM | POA: Diagnosis not present

## 2012-06-10 DIAGNOSIS — D631 Anemia in chronic kidney disease: Secondary | ICD-10-CM | POA: Diagnosis not present

## 2012-06-10 DIAGNOSIS — N2581 Secondary hyperparathyroidism of renal origin: Secondary | ICD-10-CM | POA: Diagnosis not present

## 2012-06-10 DIAGNOSIS — D509 Iron deficiency anemia, unspecified: Secondary | ICD-10-CM | POA: Diagnosis not present

## 2012-06-12 DIAGNOSIS — D631 Anemia in chronic kidney disease: Secondary | ICD-10-CM | POA: Diagnosis not present

## 2012-06-12 DIAGNOSIS — N2581 Secondary hyperparathyroidism of renal origin: Secondary | ICD-10-CM | POA: Diagnosis not present

## 2012-06-12 DIAGNOSIS — N186 End stage renal disease: Secondary | ICD-10-CM | POA: Diagnosis not present

## 2012-06-12 DIAGNOSIS — D509 Iron deficiency anemia, unspecified: Secondary | ICD-10-CM | POA: Diagnosis not present

## 2012-06-15 DIAGNOSIS — N2581 Secondary hyperparathyroidism of renal origin: Secondary | ICD-10-CM | POA: Diagnosis not present

## 2012-06-15 DIAGNOSIS — D509 Iron deficiency anemia, unspecified: Secondary | ICD-10-CM | POA: Diagnosis not present

## 2012-06-15 DIAGNOSIS — N039 Chronic nephritic syndrome with unspecified morphologic changes: Secondary | ICD-10-CM | POA: Diagnosis not present

## 2012-06-15 DIAGNOSIS — N186 End stage renal disease: Secondary | ICD-10-CM | POA: Diagnosis not present

## 2012-06-17 DIAGNOSIS — N2581 Secondary hyperparathyroidism of renal origin: Secondary | ICD-10-CM | POA: Diagnosis not present

## 2012-06-17 DIAGNOSIS — N186 End stage renal disease: Secondary | ICD-10-CM | POA: Diagnosis not present

## 2012-06-17 DIAGNOSIS — D509 Iron deficiency anemia, unspecified: Secondary | ICD-10-CM | POA: Diagnosis not present

## 2012-06-17 DIAGNOSIS — N039 Chronic nephritic syndrome with unspecified morphologic changes: Secondary | ICD-10-CM | POA: Diagnosis not present

## 2012-06-19 DIAGNOSIS — D509 Iron deficiency anemia, unspecified: Secondary | ICD-10-CM | POA: Diagnosis not present

## 2012-06-19 DIAGNOSIS — N186 End stage renal disease: Secondary | ICD-10-CM | POA: Diagnosis not present

## 2012-06-19 DIAGNOSIS — N2581 Secondary hyperparathyroidism of renal origin: Secondary | ICD-10-CM | POA: Diagnosis not present

## 2012-06-19 DIAGNOSIS — N039 Chronic nephritic syndrome with unspecified morphologic changes: Secondary | ICD-10-CM | POA: Diagnosis not present

## 2012-06-21 DIAGNOSIS — D509 Iron deficiency anemia, unspecified: Secondary | ICD-10-CM | POA: Diagnosis not present

## 2012-06-21 DIAGNOSIS — N186 End stage renal disease: Secondary | ICD-10-CM | POA: Diagnosis not present

## 2012-06-21 DIAGNOSIS — N2581 Secondary hyperparathyroidism of renal origin: Secondary | ICD-10-CM | POA: Diagnosis not present

## 2012-06-21 DIAGNOSIS — D631 Anemia in chronic kidney disease: Secondary | ICD-10-CM | POA: Diagnosis not present

## 2012-06-25 DIAGNOSIS — N186 End stage renal disease: Secondary | ICD-10-CM | POA: Diagnosis not present

## 2012-06-25 DIAGNOSIS — N2581 Secondary hyperparathyroidism of renal origin: Secondary | ICD-10-CM | POA: Diagnosis not present

## 2012-06-25 DIAGNOSIS — N039 Chronic nephritic syndrome with unspecified morphologic changes: Secondary | ICD-10-CM | POA: Diagnosis not present

## 2012-06-25 DIAGNOSIS — D509 Iron deficiency anemia, unspecified: Secondary | ICD-10-CM | POA: Diagnosis not present

## 2012-06-26 DIAGNOSIS — N2581 Secondary hyperparathyroidism of renal origin: Secondary | ICD-10-CM | POA: Diagnosis not present

## 2012-06-26 DIAGNOSIS — N039 Chronic nephritic syndrome with unspecified morphologic changes: Secondary | ICD-10-CM | POA: Diagnosis not present

## 2012-06-26 DIAGNOSIS — N186 End stage renal disease: Secondary | ICD-10-CM | POA: Diagnosis not present

## 2012-06-26 DIAGNOSIS — D509 Iron deficiency anemia, unspecified: Secondary | ICD-10-CM | POA: Diagnosis not present

## 2012-06-27 ENCOUNTER — Emergency Department (HOSPITAL_COMMUNITY): Payer: Medicare Other

## 2012-06-27 ENCOUNTER — Inpatient Hospital Stay (HOSPITAL_COMMUNITY)
Admission: EM | Admit: 2012-06-27 | Discharge: 2012-07-02 | DRG: 070 | Disposition: A | Discharge status: Rehabilitation Facility | Payer: Medicare Other | Attending: Internal Medicine | Admitting: Internal Medicine

## 2012-06-27 ENCOUNTER — Encounter (HOSPITAL_COMMUNITY): Payer: Self-pay | Admitting: *Deleted

## 2012-06-27 DIAGNOSIS — I5030 Unspecified diastolic (congestive) heart failure: Secondary | ICD-10-CM | POA: Diagnosis present

## 2012-06-27 DIAGNOSIS — M949 Disorder of cartilage, unspecified: Secondary | ICD-10-CM | POA: Diagnosis present

## 2012-06-27 DIAGNOSIS — I5032 Chronic diastolic (congestive) heart failure: Secondary | ICD-10-CM | POA: Diagnosis not present

## 2012-06-27 DIAGNOSIS — J449 Chronic obstructive pulmonary disease, unspecified: Secondary | ICD-10-CM | POA: Diagnosis present

## 2012-06-27 DIAGNOSIS — I10 Essential (primary) hypertension: Secondary | ICD-10-CM | POA: Diagnosis not present

## 2012-06-27 DIAGNOSIS — M899 Disorder of bone, unspecified: Secondary | ICD-10-CM | POA: Diagnosis present

## 2012-06-27 DIAGNOSIS — I120 Hypertensive chronic kidney disease with stage 5 chronic kidney disease or end stage renal disease: Secondary | ICD-10-CM | POA: Diagnosis not present

## 2012-06-27 DIAGNOSIS — N2581 Secondary hyperparathyroidism of renal origin: Secondary | ICD-10-CM | POA: Diagnosis present

## 2012-06-27 DIAGNOSIS — G9349 Other encephalopathy: Secondary | ICD-10-CM | POA: Diagnosis not present

## 2012-06-27 DIAGNOSIS — D899 Disorder involving the immune mechanism, unspecified: Secondary | ICD-10-CM | POA: Diagnosis present

## 2012-06-27 DIAGNOSIS — R4182 Altered mental status, unspecified: Secondary | ICD-10-CM | POA: Diagnosis not present

## 2012-06-27 DIAGNOSIS — I509 Heart failure, unspecified: Secondary | ICD-10-CM | POA: Diagnosis present

## 2012-06-27 DIAGNOSIS — I739 Peripheral vascular disease, unspecified: Secondary | ICD-10-CM | POA: Diagnosis present

## 2012-06-27 DIAGNOSIS — I2789 Other specified pulmonary heart diseases: Secondary | ICD-10-CM | POA: Diagnosis not present

## 2012-06-27 DIAGNOSIS — IMO0002 Reserved for concepts with insufficient information to code with codable children: Secondary | ICD-10-CM | POA: Diagnosis not present

## 2012-06-27 DIAGNOSIS — E119 Type 2 diabetes mellitus without complications: Secondary | ICD-10-CM | POA: Diagnosis present

## 2012-06-27 DIAGNOSIS — I2510 Atherosclerotic heart disease of native coronary artery without angina pectoris: Secondary | ICD-10-CM | POA: Diagnosis present

## 2012-06-27 DIAGNOSIS — I69991 Dysphagia following unspecified cerebrovascular disease: Secondary | ICD-10-CM | POA: Diagnosis not present

## 2012-06-27 DIAGNOSIS — Z5189 Encounter for other specified aftercare: Secondary | ICD-10-CM | POA: Diagnosis not present

## 2012-06-27 DIAGNOSIS — R197 Diarrhea, unspecified: Secondary | ICD-10-CM

## 2012-06-27 DIAGNOSIS — F17200 Nicotine dependence, unspecified, uncomplicated: Secondary | ICD-10-CM | POA: Diagnosis present

## 2012-06-27 DIAGNOSIS — D631 Anemia in chronic kidney disease: Secondary | ICD-10-CM | POA: Diagnosis present

## 2012-06-27 DIAGNOSIS — Z85528 Personal history of other malignant neoplasm of kidney: Secondary | ICD-10-CM

## 2012-06-27 DIAGNOSIS — G9340 Encephalopathy, unspecified: Secondary | ICD-10-CM | POA: Diagnosis present

## 2012-06-27 DIAGNOSIS — N186 End stage renal disease: Secondary | ICD-10-CM | POA: Diagnosis present

## 2012-06-27 DIAGNOSIS — I6789 Other cerebrovascular disease: Secondary | ICD-10-CM | POA: Diagnosis not present

## 2012-06-27 DIAGNOSIS — J4489 Other specified chronic obstructive pulmonary disease: Secondary | ICD-10-CM | POA: Diagnosis present

## 2012-06-27 DIAGNOSIS — E039 Hypothyroidism, unspecified: Secondary | ICD-10-CM | POA: Diagnosis present

## 2012-06-27 DIAGNOSIS — E875 Hyperkalemia: Secondary | ICD-10-CM | POA: Diagnosis present

## 2012-06-27 DIAGNOSIS — Z8673 Personal history of transient ischemic attack (TIA), and cerebral infarction without residual deficits: Secondary | ICD-10-CM | POA: Diagnosis not present

## 2012-06-27 DIAGNOSIS — E785 Hyperlipidemia, unspecified: Secondary | ICD-10-CM | POA: Diagnosis present

## 2012-06-27 DIAGNOSIS — R569 Unspecified convulsions: Secondary | ICD-10-CM

## 2012-06-27 DIAGNOSIS — I6350 Cerebral infarction due to unspecified occlusion or stenosis of unspecified cerebral artery: Secondary | ICD-10-CM | POA: Diagnosis not present

## 2012-06-27 DIAGNOSIS — N039 Chronic nephritic syndrome with unspecified morphologic changes: Secondary | ICD-10-CM | POA: Diagnosis not present

## 2012-06-27 DIAGNOSIS — R509 Fever, unspecified: Secondary | ICD-10-CM

## 2012-06-27 DIAGNOSIS — R159 Full incontinence of feces: Secondary | ICD-10-CM | POA: Diagnosis present

## 2012-06-27 DIAGNOSIS — R0989 Other specified symptoms and signs involving the circulatory and respiratory systems: Secondary | ICD-10-CM | POA: Diagnosis not present

## 2012-06-27 DIAGNOSIS — I6990 Unspecified sequelae of unspecified cerebrovascular disease: Secondary | ICD-10-CM | POA: Diagnosis not present

## 2012-06-27 DIAGNOSIS — G40804 Other epilepsy, intractable, without status epilepticus: Secondary | ICD-10-CM | POA: Diagnosis not present

## 2012-06-27 HISTORY — DX: Encephalopathy, unspecified: G93.40

## 2012-06-27 LAB — COMPREHENSIVE METABOLIC PANEL
ALT: 10 U/L (ref 0–35)
Albumin: 3.5 g/dL (ref 3.5–5.2)
BUN: 36 mg/dL — ABNORMAL HIGH (ref 6–23)
Calcium: 9.2 mg/dL (ref 8.4–10.5)
GFR calc Af Amer: 5 mL/min — ABNORMAL LOW (ref >90)
Glucose, Bld: 77 mg/dL (ref 70–99)
Sodium: 135 mEq/L (ref 135–145)
Total Protein: 6.8 g/dL (ref 6.0–8.3)

## 2012-06-27 LAB — CBC
Hemoglobin: 12.1 g/dL (ref 12.0–15.0)
MCH: 27.7 pg (ref 26.0–34.0)
MCHC: 33.2 g/dL (ref 30.0–36.0)
RDW: 15 % (ref 11.5–15.5)

## 2012-06-27 LAB — POTASSIUM: Potassium: 6.8 mEq/L (ref 3.5–5.1)

## 2012-06-27 LAB — ACETAMINOPHEN LEVEL: Acetaminophen (Tylenol), Serum: <15 ug/mL (ref 10–30)

## 2012-06-27 LAB — CG4 I-STAT (LACTIC ACID): Lactic Acid, Venous: 1.26 mmol/L (ref 0.5–2.2)

## 2012-06-27 LAB — GLUCOSE, CAPILLARY: Glucose-Capillary: 78 mg/dL (ref 70–99)

## 2012-06-27 MED ORDER — SODIUM CHLORIDE 0.9 % IV SOLN
1.0000 g | Freq: Once | INTRAVENOUS | Status: AC
  Administered 2012-06-28: 1 g via INTRAVENOUS
  Filled 2012-06-27: qty 10

## 2012-06-27 MED ORDER — LEVOTHYROXINE SODIUM 88 MCG PO TABS
88.0000 ug | ORAL_TABLET | Freq: Every day | ORAL | Status: DC
  Administered 2012-06-28 – 2012-07-02 (×5): 88 ug via ORAL
  Filled 2012-06-27 (×6): qty 1

## 2012-06-27 MED ORDER — CARVEDILOL 25 MG PO TABS
25.0000 mg | ORAL_TABLET | Freq: Two times a day (BID) | ORAL | Status: DC
  Administered 2012-06-28 – 2012-07-02 (×10): 25 mg via ORAL
  Filled 2012-06-27 (×11): qty 1

## 2012-06-27 MED ORDER — VANCOMYCIN HCL IN DEXTROSE 1-5 GM/200ML-% IV SOLN
1000.0000 mg | Freq: Once | INTRAVENOUS | Status: AC
  Administered 2012-06-27: 1000 mg via INTRAVENOUS
  Filled 2012-06-27: qty 200

## 2012-06-27 MED ORDER — MOMETASONE FURO-FORMOTEROL FUM 100-5 MCG/ACT IN AERO
2.0000 | INHALATION_SPRAY | Freq: Two times a day (BID) | RESPIRATORY_TRACT | Status: DC
  Administered 2012-06-28 – 2012-07-02 (×8): 2 via RESPIRATORY_TRACT
  Filled 2012-06-27: qty 8.8

## 2012-06-27 MED ORDER — SEVELAMER CARBONATE 800 MG PO TABS
3200.0000 mg | ORAL_TABLET | Freq: Two times a day (BID) | ORAL | Status: DC
  Administered 2012-06-28 – 2012-07-02 (×10): 3200 mg via ORAL
  Filled 2012-06-27 (×11): qty 4

## 2012-06-27 MED ORDER — SODIUM POLYSTYRENE SULFONATE 15 GM/60ML PO SUSP
30.0000 g | Freq: Once | ORAL | Status: AC
  Administered 2012-06-27: 30 g via RECTAL
  Filled 2012-06-27: qty 120

## 2012-06-27 MED ORDER — SODIUM CHLORIDE 0.9 % IV SOLN
1.0000 g | Freq: Once | INTRAVENOUS | Status: AC
  Administered 2012-06-27: 1 g via INTRAVENOUS
  Filled 2012-06-27: qty 10

## 2012-06-27 MED ORDER — METRONIDAZOLE IN NACL 5-0.79 MG/ML-% IV SOLN
500.0000 mg | Freq: Three times a day (TID) | INTRAVENOUS | Status: DC
  Administered 2012-06-27 – 2012-06-28 (×2): 500 mg via INTRAVENOUS
  Filled 2012-06-27 (×5): qty 100

## 2012-06-27 MED ORDER — SIMVASTATIN 20 MG PO TABS
20.0000 mg | ORAL_TABLET | Freq: Every day | ORAL | Status: DC
  Administered 2012-06-28 – 2012-07-01 (×3): 20 mg via ORAL
  Filled 2012-06-27 (×5): qty 1

## 2012-06-27 MED ORDER — DEXTROSE 50 % IV SOLN
1.0000 | Freq: Once | INTRAVENOUS | Status: AC
  Administered 2012-06-28: 50 mL via INTRAVENOUS
  Filled 2012-06-27: qty 50

## 2012-06-27 MED ORDER — AMLODIPINE BESYLATE 10 MG PO TABS
10.0000 mg | ORAL_TABLET | Freq: Every day | ORAL | Status: DC
  Administered 2012-06-28 – 2012-07-02 (×5): 10 mg via ORAL
  Filled 2012-06-27 (×5): qty 1

## 2012-06-27 MED ORDER — TRAZODONE HCL 50 MG PO TABS
50.0000 mg | ORAL_TABLET | Freq: Every evening | ORAL | Status: DC | PRN
  Administered 2012-06-28: 50 mg via ORAL
  Filled 2012-06-27 (×2): qty 1

## 2012-06-27 MED ORDER — HYDRALAZINE HCL 50 MG PO TABS
50.0000 mg | ORAL_TABLET | Freq: Three times a day (TID) | ORAL | Status: DC
  Administered 2012-06-28 – 2012-07-02 (×16): 50 mg via ORAL
  Filled 2012-06-27 (×22): qty 1

## 2012-06-27 MED ORDER — DEXTROSE 5 % IV SOLN
2.0000 g | Freq: Once | INTRAVENOUS | Status: AC
  Administered 2012-06-28: 2 g via INTRAVENOUS
  Filled 2012-06-27: qty 2

## 2012-06-27 MED ORDER — LABETALOL HCL 5 MG/ML IV SOLN
10.0000 mg | INTRAVENOUS | Status: DC | PRN
  Administered 2012-06-27: 10 mg via INTRAVENOUS
  Filled 2012-06-27: qty 4

## 2012-06-27 MED ORDER — CINACALCET HCL 30 MG PO TABS
30.0000 mg | ORAL_TABLET | Freq: Every day | ORAL | Status: DC
  Administered 2012-06-28 – 2012-06-30 (×3): 30 mg via ORAL
  Filled 2012-06-27 (×6): qty 1

## 2012-06-27 MED ORDER — SODIUM CHLORIDE 0.9 % IV BOLUS (SEPSIS)
500.0000 mL | Freq: Once | INTRAVENOUS | Status: AC
  Administered 2012-06-27: 500 mL via INTRAVENOUS

## 2012-06-27 MED ORDER — LEVETIRACETAM 500 MG PO TABS
500.0000 mg | ORAL_TABLET | Freq: Every day | ORAL | Status: DC
  Administered 2012-06-28 – 2012-06-30 (×3): 500 mg via ORAL
  Filled 2012-06-27 (×6): qty 1

## 2012-06-27 MED ORDER — SODIUM BICARBONATE 8.4 % IV SOLN
50.0000 meq | Freq: Once | INTRAVENOUS | Status: AC
  Administered 2012-06-28: 50 meq via INTRAVENOUS
  Filled 2012-06-27: qty 50

## 2012-06-27 MED ORDER — VANCOMYCIN HCL 500 MG IV SOLR
500.0000 mg | Freq: Once | INTRAVENOUS | Status: AC
  Administered 2012-06-28: 500 mg via INTRAVENOUS
  Filled 2012-06-27: qty 500

## 2012-06-27 MED ORDER — SODIUM CHLORIDE 0.9 % IV SOLN
INTRAVENOUS | Status: DC
  Administered 2012-06-28 (×2): via INTRAVENOUS

## 2012-06-27 MED ORDER — SODIUM POLYSTYRENE SULFONATE 15 GM/60ML PO SUSP
30.0000 g | Freq: Once | ORAL | Status: AC
  Administered 2012-06-27: 30 g via ORAL
  Filled 2012-06-27: qty 120

## 2012-06-27 MED ORDER — INSULIN ASPART 100 UNIT/ML IV SOLN
10.0000 [IU] | Freq: Once | INTRAVENOUS | Status: AC
  Administered 2012-06-28: 10 [IU] via INTRAVENOUS

## 2012-06-27 MED ORDER — ACETAMINOPHEN 325 MG PO TABS
650.0000 mg | ORAL_TABLET | Freq: Four times a day (QID) | ORAL | Status: DC | PRN
  Administered 2012-06-27 – 2012-07-01 (×3): 650 mg via ORAL
  Filled 2012-06-27 (×3): qty 2

## 2012-06-27 MED ORDER — IPRATROPIUM-ALBUTEROL 18-103 MCG/ACT IN AERO: 2.0000 | INHALATION_SPRAY | Freq: Four times a day (QID) | RESPIRATORY_TRACT | Status: DC | PRN

## 2012-06-27 MED ORDER — PIPERACILLIN-TAZOBACTAM IN DEX 2-0.25 GM/50ML IV SOLN
2.2500 g | Freq: Once | INTRAVENOUS | Status: DC
  Filled 2012-06-27: qty 50

## 2012-06-27 MED ORDER — ONDANSETRON HCL 4 MG/2ML IJ SOLN
4.0000 mg | Freq: Three times a day (TID) | INTRAMUSCULAR | Status: DC | PRN
  Administered 2012-06-27: 4 mg via INTRAVENOUS
  Filled 2012-06-27: qty 2

## 2012-06-27 MED ORDER — ASPIRIN EC 81 MG PO TBEC
81.0000 mg | DELAYED_RELEASE_TABLET | Freq: Every day | ORAL | Status: DC
  Administered 2012-06-28 – 2012-07-02 (×5): 81 mg via ORAL
  Filled 2012-06-27 (×5): qty 1

## 2012-06-27 MED ORDER — HEPARIN SODIUM (PORCINE) 5000 UNIT/ML IJ SOLN: 5000.0000 [IU] | Freq: Three times a day (TID) | INTRAMUSCULAR | Status: DC

## 2012-06-27 MED ORDER — ACETAMINOPHEN 650 MG RE SUPP
650.0000 mg | Freq: Once | RECTAL | Status: AC
  Administered 2012-06-27: 650 mg via RECTAL
  Filled 2012-06-27: qty 1

## 2012-06-27 MED ORDER — SODIUM CHLORIDE 0.9 % IJ SOLN
3.0000 mL | Freq: Two times a day (BID) | INTRAMUSCULAR | Status: DC
  Administered 2012-06-28 – 2012-07-02 (×6): 3 mL via INTRAVENOUS

## 2012-06-27 MED ORDER — TRAMADOL HCL 50 MG PO TABS
50.0000 mg | ORAL_TABLET | Freq: Three times a day (TID) | ORAL | Status: DC | PRN
  Administered 2012-06-30 – 2012-07-01 (×2): 50 mg via ORAL
  Filled 2012-06-27 (×2): qty 1

## 2012-06-27 NOTE — ED Provider Notes (Signed)
History    CSN: [id number] Arrival date & time 06/27/12  1656  First MD Initiated Contact with Patient 06/27/12 1731     Chief Complaint  Patient presents with  . Altered Mental Status   (Consider location/radiation/quality/duration/timing/severity/associated sxs/prior Treatment) HPI Comments: Patient is a 67 yo F PMHx significant for CAD, CVA, COPD, Renal carcinoma, ESRD on dialysis Monday/Wednesday/Friday presented to emergency department for speech difficulties, confusion, nonbloody diarrhea, nausea, and generalized weakness that began this morning around 11 AM that has been unresolved. Patient's family states she was having difficulty speaking to them all day and seemed very confused until the family brought her in to the ED around 4pm. Patient had been feeling well until today at 52. Family states that patient had to be admitted to the hospital several times for confusion and AMS with the most recent admission being in January. Family endorses that patient presented the same today as she did in January. Patient is a level V caveat d/t AMS.   The history is provided by the patient and a relative.      Past Medical History  Diagnosis Date  . CAD (coronary artery disease)     stent to RCA  . CVA (cerebral infarction) 2003    no apparent residual  . Hypothyroidism   . PVD (peripheral vascular disease)   . Hyperlipidemia   . Positive PPD     completed rifampin  . Diastolic congestive heart failure   . Hypertension   . COPD (chronic obstructive pulmonary disease) t  . Cancer     clear cell cancer, kidney  . Complication of anesthesia 12/2010    pt is very confused, with AMS with anesthesia  . CVA 07/27/2008    CVA affected cognition and memory per family, no focal deficits.    . ESRD 07/27/2008    ESRD due to HTN and NSAID's, started hemodialysis in 2005 in [place], [place]. Went to [facility] from 2010 to 2012 and since 2012 has been getting dialysis at [facility] on  [address] in [place] on a MWF schedule. First access with RUA AVG placed in [place]. Next and current access was LUA AVG placed by Dr. Wyn Quaker in [place] in or around 2012. Has had 2 or 3 procedures on that graft since placed per family. She gets her access work done here in [place] now. She is allergic to heparin and does not get any heparin at dialysis; she had an allergic reaction apparently when in ICU in the past.       Past Surgical History  Procedure Laterality Date  . Abdominal hysterectomy    . Cholecystectomy    . D&cs    . Right ankle repair    . Left nephrectomy    . Vascular surgery  11/2010    graft inserted to left arm   Family History  Problem Relation Age of Onset  . Heart disease Father   . Hypertension Mother   . Dementia Mother   . Coronary artery disease Sister    History  Substance Use Topics  . Smoking status: Current Every Day Smoker -- 2.00 packs/day for 53 years    Types: Cigarettes  . Smokeless tobacco: Never Used     Comment: 1/2 ppd since age 66. has stopped since hospitalization but wants to go back  . Alcohol Use: No   OB History   Grav Para Term Preterm Abortions TAB SAB Ect Mult Living  Review of Systems  Unable to perform ROS: Mental status change    Allergies  Iohexol; Ampicillin; Meperidine hcl; Morphine; Penicillins; Valproic acid and related; Heparin; Pentazocine lactate; Phenytoin; Wellbutrin; and Ace inhibitors  Home Medications   Current Outpatient Rx  Name  Route  Sig  Dispense  Refill  . acetaminophen (TYLENOL) 500 MG tablet   Oral   Take 500 mg by mouth every 6 (six) hours as needed for pain.         [name] albuterol-ipratropium (COMBIVENT) 18-103 MCG/ACT inhaler   Inhalation   Inhale 2 puffs into the lungs every 6 (six) hours as needed. For shortness of breath         . amLODipine (NORVASC) 10 MG tablet   Oral   Take 10 mg by mouth daily.          [name] aspirin EC 81 MG tablet   Oral   Take 81  mg by mouth daily.         . carvedilol (COREG) 25 MG tablet   Oral   Take 25 mg by mouth 2 (two) times daily with a meal.          . cinacalcet (SENSIPAR) 30 MG tablet   Oral   Take 30 mg by mouth at bedtime.         . hydrALAZINE (APRESOLINE) 50 MG tablet   Oral   Take 1 tablet (50 mg total) by mouth 4 (four) times daily.   120 tablet   1     NOTE THE CHANGE IN DOSAGE   . levETIRAcetam (KEPPRA) 500 MG tablet   Oral   Take 500 mg by mouth daily at 8 pm.         . loperamide (IMODIUM) 2 MG capsule   Oral   Take 2 mg by mouth 4 (four) times daily as needed for diarrhea or loose stools.         [name] oxyCODONE (OXYCONTIN) 10 MG 12 hr tablet   Oral   Take 10 mg by mouth every 12 (twelve) hours.         . pravastatin (PRAVACHOL) 40 MG tablet   Oral   Take 40 mg by mouth at bedtime.          . sevelamer (RENAGEL) 800 MG tablet   Oral   Take 1,600-2,800 mg by mouth 3 (three) times daily with meals. Take 4 tablets in the morning, two tablets at noon, and 4 tablets at night         . simethicone (MYLICON) 125 MG chewable tablet   Oral   Chew 125 mg by mouth every 6 (six) hours as needed for flatulence.         . traMADol (ULTRAM) 50 MG tablet   Oral   Take 50 mg by mouth 3 (three) times daily as needed for pain.         . traZODone (DESYREL) 50 MG tablet   Oral   Take 50 mg by mouth at bedtime as needed for sleep.          . citalopram (CELEXA) 20 MG tablet   Oral   Take 20 mg by mouth daily.          . diphenhydrAMINE (BENADRYL) 50 MG capsule   Oral   Take 50 mg by mouth as directed. Benadryl 50 mg by mouth 1 hr prior to CT.  Dispense two 25 mg caps.         [name]  Fluticasone-Salmeterol (ADVAIR) 100-50 MCG/DOSE AEPB   Inhalation   Inhale 1 puff into the lungs every 12 (twelve) hours.         [name] levothyroxine (SYNTHROID, LEVOTHROID) 88 MCG tablet   Oral   Take 88 mcg by mouth daily.          . multivitamin (RENA-VIT) TABS tablet    Oral   Take 1 tablet by mouth daily.          [name] NITROSTAT 0.4 MG SL tablet   Sublingual   Place 0.4 mg under the tongue every 5 (five) minutes as needed. For chest pain.         . polyethylene glycol powder (GLYCOLAX/MIRALAX) powder   Oral   Take 17 g by mouth daily as needed. For constipation.          BP 209/89  Pulse 87  Temp(Src) 99.7 F (37.6 C) (Oral)  Resp 20  SpO2 96% Physical Exam  Constitutional: She appears well-developed and well-nourished.  HENT:  Head: Normocephalic and atraumatic.  Mouth/Throat: Oropharynx is clear and moist.  Eyes: Conjunctivae and EOM are normal. Pupils are equal, round, and reactive to light.  Neck: Neck supple.  Cardiovascular: Normal rate, regular rhythm, normal heart sounds and intact distal pulses.   Pulmonary/Chest: Effort normal and breath sounds normal. No respiratory distress.  Abdominal: Soft. Bowel sounds are normal. There is tenderness.  Musculoskeletal: She exhibits no edema.  Neurological: She is alert.  Strength intact 5/5 bilaterally UE and LE  Skin: Skin is warm and dry. No erythema.  Dialysis graft intact w/o erythema on left upper arm.     ED Course  Procedures (including critical care time)  Medications  piperacillin-tazobactam (ZOSYN) IVPB 2.25 g (not administered)  vancomycin (VANCOCIN) IVPB 1000 mg/200 mL premix (1,000 mg Intravenous New Bag/Given 06/27/12 2057)  ondansetron (ZOFRAN) injection 4 mg (not administered)  acetaminophen (TYLENOL) suppository 650 mg (650 mg Rectal Given 06/27/12 1837)  sodium chloride 0.9 % bolus 500 mL (0 mLs Intravenous Stopped 06/27/12 2046)  sodium polystyrene (KAYEXALATE) 15 GM/60ML suspension 30 g (30 g Rectal Given 06/27/12 1923)  sodium chloride 0.9 % bolus 500 mL (500 mLs Intravenous New Bag/Given 06/27/12 2046)     Labs Reviewed  COMPREHENSIVE METABOLIC PANEL - Abnormal; Notable for the following:    Potassium 6.2 (*)    Chloride 94 (*)    BUN 36 (*)    Creatinine, Ser  8.03 (*)    GFR calc non Af Amer 5 (*)    GFR calc Af Amer 5 (*)    All other components within normal limits  SALICYLATE LEVEL - Abnormal; Notable for the following:    Salicylate Lvl <2.0 (*)    All other components within normal limits  CULTURE, BLOOD (ROUTINE X 2)  CULTURE, BLOOD (ROUTINE X 2)  CBC  GLUCOSE, CAPILLARY  ACETAMINOPHEN LEVEL  URINALYSIS, ROUTINE W REFLEX MICROSCOPIC  OCCULT BLOOD X 1 CARD TO LAB, STOOL  CG4 I-STAT (LACTIC ACID)   Ct Head Wo Contrast  06/27/2012   *RADIOLOGY REPORT*  Clinical Data: Altered mental status  CT HEAD WITHOUT CONTRAST  Technique:  Contiguous axial images were obtained from the base of the skull through the vertex without contrast.  Comparison: CT 01/20/2012  Findings: Generalized atrophy unchanged.  Multiple areas of chronic ischemia including the cerebellum bilaterally, left frontal temporal lobe, left parietal lobe, and left occipital lobe.  No acute infarct.  Negative for hemorrhage or mass.  Calvarium is intact.  IMPRESSION: Atrophy and extensive chronic ischemia.  No acute abnormality.   Original Report Authenticated By: Janeece Riggers, M.D.   Dg Chest Port 1 View  06/27/2012   *RADIOLOGY REPORT*  Clinical Data: Chest pain  PORTABLE CHEST - 1 VIEW  Comparison: Prior chest x-ray 01/20/2012  Findings: Stable cardiomegaly with left ventricular prominence. Similar degree of mild pulmonary vascular congestion without overt edema.  Inspiratory volumes are low.  No focal airspace consolidation, pneumothorax or large effusion.  Atherosclerotic calcifications noted in the transverse aorta.  No acute osseous abnormality.  FLAIR stent graft noted in the right upper extremity.  IMPRESSION:  1.  Slightly low inspiratory volumes, otherwise no acute cardiopulmonary disease. 2.  Stable cardiomegaly with left ventricular prominence and chronic vascular congestion.   Original Report Authenticated By: Malachy Moan, M.D.   1. Altered mental status   2. Diarrhea    3. Fever     MDM  Patient given Kayexalate for her hyperkalemia of 6.2. IV antibiotics started in ED. CXR and CT head reviewed. Labs and vitals reviewed. Patient d/w with Dr. Rosalia Hammers, agrees with plan. Patient will be admitted for further evaluation and work up. The patient appears reasonably stabilized for admission considering the current resources, flow, and capabilities available in the ED at this time, and I doubt any other [facility] requiring further screening and/or treatment in the ED prior to admission.   Jeannetta Ellis, PA-C 06/29/12 [phone]

## 2012-06-27 NOTE — Progress Notes (Signed)
Also calling and speaking with Dr. Kathrene Bongo (nephrologist on call) directly given the patients worsening hyperkalemia.

## 2012-06-27 NOTE — Progress Notes (Signed)
New orders for insulin, d50, kayexalate, and bicarb given.

## 2012-06-27 NOTE — Progress Notes (Signed)
Spoke with Dr. Kathrene Bongo, giving additional amp of calcium gluconate and plans being made to dialyze patient tonight.

## 2012-06-27 NOTE — ED Notes (Signed)
Critical lab potassium 6.8, admitting, Gardner DO notified.

## 2012-06-27 NOTE — ED Notes (Signed)
Critical lab called. Potassium 6.8. Nurse caring for patient and EDP made aware.

## 2012-06-27 NOTE — H&P (Signed)
[facility] History and Physical  [name] WUJ:[id number] DOB: 1945/12/20 DOA: 06/27/2012  Referring physician: ED PCP: [name], MD  Chief Complaint: AMS, Diarrhea  HPI: [name] is a 67 y.o. female who presents to the ED with c/o fever, AMS, and diarrhea.  Diarrhea onset earlier this morning, followed soon after by AMS and fever.  Patient has symptoms of nearly identical AMS symptoms occuring last in Jan of this year determined during that visit to be due to hypertensive encephalopathy.  Patient also apparently didn't undergo a full dialysis session yesterday.  In the ED potassium was 6.2 with some peaking of T waves on EKG.  She was febrile to 101.x, and in obvious rigors at this time.  She was also noted to be incontinent of stool (having uncontrollable diarrhea).  Hospitalist has been asked to admit.  Review of Systems: Denies headache, pain anywhere, states she is feeling very cold (obviously undergoing rigors at this time), having uncontrollable diarrhea, denies new numbness or weakness on either side of her body, 12 systems reviewed and otherwise negative.  Past Medical History  Diagnosis Date  . CAD (coronary artery disease)     stent to RCA  . CVA (cerebral infarction) 2003    no apparent residual  . Hypothyroidism   . PVD (peripheral vascular disease)   . Hyperlipidemia   . Positive PPD     completed rifampin  . Diastolic congestive heart failure   . Hypertension   . COPD (chronic obstructive pulmonary disease) t  . Cancer     clear cell cancer, kidney  . Complication of anesthesia 12/2010    pt is very confused, with AMS with anesthesia  . CVA 07/27/2008    CVA affected cognition and memory per family, no focal deficits.    . ESRD 07/27/2008    ESRD due to HTN and NSAID's, started hemodialysis in 2005 in [place], [place]. Went to [facility] from 2010 to 2012 and since 2012 has been getting dialysis at [facility] on [address] in [place] on a  MWF schedule. First access with RUA AVG placed in [place]. Next and current access was LUA AVG placed by Dr. Wyn Quaker in [place] in or around 2012. Has had 2 or 3 procedures on that graft since placed per family. She gets her access work done here in [place] now. She is allergic to heparin and does not get any heparin at dialysis; she had an allergic reaction apparently when in ICU in the past.       Past Surgical History  Procedure Laterality Date  . Abdominal hysterectomy    . Cholecystectomy    . D&cs    . Right ankle repair    . Left nephrectomy    . Vascular surgery  11/2010    graft inserted to left arm   Social History:  reports that she has been smoking Cigarettes.  She has a 106 pack-year smoking history. She has never used smokeless tobacco. She reports that she does not drink alcohol or use illicit drugs.   Allergies  Allergen Reactions  . Iohexol Swelling and Other (See Comments)    1970s; passed out and had facial/tongue swelling.  Requires 13-hour prep with prednisone and benadryl  . Ampicillin Hives and Rash  . Meperidine Hcl Swelling and Rash    Makes tongue swell  . Morphine Rash  . Penicillins Rash  . Valproic Acid And Related Other (See Comments)    Confusion   . Heparin  MDs told her not to take after reaction in ICU  . Pentazocine Lactate     Patient does not remember reaction to this med ([place]win).   . Phenytoin Other (See Comments)    Had reaction while in ICU; doesn't know.  MDs told her not to take ever again.  . Wellbutrin (Bupropion)     seizures  . Ace Inhibitors Rash    Family History  Problem Relation Age of Onset  . Heart disease Father   . Hypertension Mother   . Dementia Mother   . Coronary artery disease Sister     Prior to Admission medications   Medication Sig Start Date End Date Taking? Authorizing Provider  acetaminophen (TYLENOL) 500 MG tablet Take 500 mg by mouth every 6 (six) hours as needed for pain.   Yes Historical  Provider, MD  albuterol-ipratropium (COMBIVENT) 18-103 MCG/ACT inhaler Inhale 2 puffs into the lungs every 6 (six) hours as needed. For shortness of breath   Yes Historical Provider, MD  amLODipine (NORVASC) 10 MG tablet Take 10 mg by mouth daily.    Yes Historical Provider, MD  aspirin EC 81 MG tablet Take 81 mg by mouth daily.   Yes Historical Provider, MD  carvedilol (COREG) 25 MG tablet Take 25 mg by mouth 2 (two) times daily with a meal.  10/30/10  Yes Historical Provider, MD  cinacalcet (SENSIPAR) 30 MG tablet Take 30 mg by mouth at bedtime.   Yes Historical Provider, MD  hydrALAZINE (APRESOLINE) 50 MG tablet Take 1 tablet (50 mg total) by mouth 4 (four) times daily. 05/16/11  Yes Evlyn Kanner Love, PA-C  levETIRAcetam (KEPPRA) 500 MG tablet Take 500 mg by mouth daily at 8 pm.   Yes Historical Provider, MD  loperamide (IMODIUM) 2 MG capsule Take 2 mg by mouth 4 (four) times daily as needed for diarrhea or loose stools.   Yes Historical Provider, MD  oxyCODONE (OXYCONTIN) 10 MG 12 hr tablet Take 10 mg by mouth every 12 (twelve) hours.   Yes Historical Provider, MD  pravastatin (PRAVACHOL) 40 MG tablet Take 40 mg by mouth at bedtime.    Yes Historical Provider, MD  sevelamer (RENAGEL) 800 MG tablet Take 1,600-2,800 mg by mouth 3 (three) times daily with meals. Take 4 tablets in the morning, two tablets at noon, and 4 tablets at night   Yes Historical Provider, MD  simethicone (MYLICON) 125 MG chewable tablet Chew 125 mg by mouth every 6 (six) hours as needed for flatulence.   Yes Historical Provider, MD  traMADol (ULTRAM) 50 MG tablet Take 50 mg by mouth 3 (three) times daily as needed for pain.   Yes Historical Provider, MD  traZODone (DESYREL) 50 MG tablet Take 50 mg by mouth at bedtime as needed for sleep.    Yes Historical Provider, MD  citalopram (CELEXA) 20 MG tablet Take 20 mg by mouth daily.     Historical Provider, MD  diphenhydrAMINE (BENADRYL) 50 MG capsule Take 50 mg by mouth as directed.  Benadryl 50 mg by mouth 1 hr prior to CT.  Dispense two 25 mg caps. 02/29/12   Reola Calkins, MD  Fluticasone-Salmeterol (ADVAIR) 100-50 MCG/DOSE AEPB Inhale 1 puff into the lungs every 12 (twelve) hours.    Historical Provider, MD  levothyroxine (SYNTHROID, LEVOTHROID) 88 MCG tablet Take 88 mcg by mouth daily.     Historical Provider, MD  multivitamin (RENA-VIT) TABS tablet Take 1 tablet by mouth daily.     Historical Provider, MD  NITROSTAT 0.4 MG SL tablet Place 0.4 mg under the tongue every 5 (five) minutes as needed. For chest pain. 09/16/10   Historical Provider, MD  polyethylene glycol powder (GLYCOLAX/MIRALAX) powder Take 17 g by mouth daily as needed. For constipation. 09/28/10   Historical Provider, MD   Physical Exam: Filed Vitals:   06/27/12 1800 06/27/12 1824 06/27/12 1924 06/27/12 2046  BP: 128/106 192/65  209/89  Pulse: 68 70  87  Temp:  99.1 F (37.3 C) 99.7 F (37.6 C)   TempSrc:  Oral  Oral  Resp: 21 22  20   SpO2: 100% 100%  96%    General:  Having rigors, but is able to wake up and answer questions Eyes: PEERLA EOMI ENT: mucous membranes moist Neck: supple w/o JVD Cardiovascular: RRR w/o MRG Respiratory: CTA B Abdomen: soft, nt, nd, bs+ Skin: no rash nor lesion Musculoskeletal: MAE, full ROM all 4 extremities Psychiatric: normal tone and affect Neurologic: AAOx3, smile equal bilaterally, strength 5/5 all extremities, no obvious focal deficits on exam  Labs on Admission:  Basic Metabolic Panel:  Recent Labs Lab 06/27/12 1712  NA 135  K 6.2*  CL 94*  CO2 25  GLUCOSE 77  BUN 36*  CREATININE 8.03*  CALCIUM 9.2   Liver Function Tests:  Recent Labs Lab 06/27/12 1712  AST 15  ALT 10  ALKPHOS 89  BILITOT 0.3  PROT 6.8  ALBUMIN 3.5   No results found for this basename: LIPASE, AMYLASE,  in the last 168 hours No results found for this basename: AMMONIA,  in the last 168 hours CBC:  Recent Labs Lab 06/27/12 1712  WBC 6.8  HGB 12.1  HCT  36.4  MCV 83.3  PLT 162   Cardiac Enzymes: No results found for this basename: CKTOTAL, CKMB, CKMBINDEX, TROPONINI,  in the last 168 hours  BNP (last 3 results) No results found for this basename: PROBNP,  in the last 8760 hours CBG:  Recent Labs Lab 06/27/12 1708  GLUCAP 78    Radiological Exams on Admission: Ct Head Wo Contrast  06/27/2012   *RADIOLOGY REPORT*  Clinical Data: Altered mental status  CT HEAD WITHOUT CONTRAST  Technique:  Contiguous axial images were obtained from the base of the skull through the vertex without contrast.  Comparison: CT 01/20/2012  Findings: Generalized atrophy unchanged.  Multiple areas of chronic ischemia including the cerebellum bilaterally, left frontal temporal lobe, left parietal lobe, and left occipital lobe.  No acute infarct.  Negative for hemorrhage or mass.  Calvarium is intact.  IMPRESSION: Atrophy and extensive chronic ischemia.  No acute abnormality.   Original Report Authenticated By: Janeece Riggers, M.D.   Dg Chest Port 1 View  06/27/2012   *RADIOLOGY REPORT*  Clinical Data: Chest pain  PORTABLE CHEST - 1 VIEW  Comparison: Prior chest x-ray 01/20/2012  Findings: Stable cardiomegaly with left ventricular prominence. Similar degree of mild pulmonary vascular congestion without overt edema.  Inspiratory volumes are low.  No focal airspace consolidation, pneumothorax or large effusion.  Atherosclerotic calcifications noted in the transverse aorta.  No acute osseous abnormality.  FLAIR stent graft noted in the right upper extremity.  IMPRESSION:  1.  Slightly low inspiratory volumes, otherwise no acute cardiopulmonary disease. 2.  Stable cardiomegaly with left ventricular prominence and chronic vascular congestion.   Original Report Authenticated By: Malachy Moan, M.D.    EKG: Independently reviewed. Shows tall peaked t waves  Assessment/Plan Principal Problem:   Encephalopathy acute Active Problems:   Essential hypertension,  malignant    ESRD   Hyperkalemia, diminished renal excretion   1. Acute encephalopathy - ddx includes 1. Delirium - due to infectious cause, seems likely given the fever of 101.x documented here in the ED, obvious rigors on exam, not entirely certain of source but strongly suspect the diarrhea which just happened to start this morning and is severe.  Sending for C.Diff and stool cultures, patient started on zosyn / vanc in ED, will switch to cefepime / vanc / flagyl (to get flagyl on board in case it is indeed C.Diff).  Dialysis site non tender and not erythematous, patient states no headache or pain anywhere else to give suggestion as to localization.  Gentle hydration with 75 cc/hr of NS for the diarrhea (patient didn't undergo full dialysis session yesterday and is already hypertensive). 2. Hypertensive encephalopathy - patient has history of this in the past with most recent admission for this just a few months ago in Jan, because no new focal findings to suggest stroke on exam will therefore treat patients very high blood pressure of 209/89 with labetalol to see if this brings about improvement in mental status. 3. CT head was negative, if symptoms persist tomorrow then patient should get MRI brain just to be sure there isnt something else going on but given her history of numerous hospital stays with per daughter identical AMS symptoms, it is most likely to be one of the two above. 2. Hyperkalemia - potassium 6.2, on hyperkalemia pathway, recheck of potassium after kayexelate is pending, adding calcium gluconate given the EKG findings, tele monitor. 3. ESRD - next dialysis tomorrow  Called nephrology consult line to see patient for dialysis tomorrow AM.  Code Status: Full Code (must indicate code status--if unknown or must be presumed, indicate so) Family Communication: Spoke with daughter at bedside (indicate person spoken with, if applicable, with phone number if by telephone) Disposition Plan: Admit to  inpatient (indicate anticipated LOS)  Time spent: 70 min  Robertson Colclough M. [facility] Pager [phone]  If 7PM-7AM, please contact night-coverage [email or web address] Password [email or web address] 06/27/2012, 9:19 PM

## 2012-06-27 NOTE — ED Notes (Signed)
Pt and pts family report that family noticed that pt wasn't able to get words out at 11am today.  Reports that pt was frustrated because she couldn't get her words out. Pt not following commands appropriately.  No droop, drift or slurred speech noted.  Pt very lethargic.

## 2012-06-27 NOTE — Progress Notes (Signed)
ANTIBIOTIC CONSULT NOTE - INITIAL  Pharmacy Consult for Vancomycin and Cefepime Indication: fevers  Allergies  Allergen Reactions  . Iohexol Swelling and Other (See Comments)    1970s; passed out and had facial/tongue swelling.  Requires 13-hour prep with prednisone and benadryl  . Ampicillin Hives and Rash  . Meperidine Hcl Swelling and Rash    Makes tongue swell  . Morphine Rash  . Penicillins Rash  . Valproic Acid And Related Other (See Comments)    Confusion   . Heparin     MDs told her not to take after reaction in ICU  . Pentazocine Lactate     Patient does not remember reaction to this med (Talwin).   . Phenytoin Other (See Comments)    Had reaction while in ICU; doesn't know.  MDs told her not to take ever again.  . Wellbutrin (Bupropion)     seizures  . Ace Inhibitors Rash  . Codeine Itching and Rash    Patient Measurements:   Last weight 62.6kg (Feb 2014)  Vital Signs: Temp: 99.7 F (37.6 C) (06/26 1924) Temp src: Oral (06/26 2046) BP: 209/89 mmHg (06/26 2046) Pulse Rate: 87 (06/26 2046) Intake/Output from previous day:   Intake/Output from this shift: Total I/O In: -  Out: 250 [Stool:250]  Labs:  Recent Labs  06/27/12 1712  WBC 6.8  HGB 12.1  PLT 162  CREATININE 8.03*   The CrCl is unknown because both a height and weight (above a minimum accepted value) are required for this calculation. No results found for this basename: VANCOTROUGH, VANCOPEAK, VANCORANDOM, GENTTROUGH, GENTPEAK, GENTRANDOM, TOBRATROUGH, TOBRAPEAK, TOBRARND, AMIKACINPEAK, AMIKACINTROU, AMIKACIN,  in the last 72 hours   Microbiology: No results found for this or any previous visit (from the past 720 hour(s)).  Medical History: Past Medical History  Diagnosis Date  . CAD (coronary artery disease)     stent to RCA  . CVA (cerebral infarction) 2003    no apparent residual  . Hypothyroidism   . PVD (peripheral vascular disease)   . Hyperlipidemia   . Positive PPD    completed rifampin  . Diastolic congestive heart failure   . Hypertension   . COPD (chronic obstructive pulmonary disease) t  . Cancer     clear cell cancer, kidney  . Complication of anesthesia 12/2010    pt is very confused, with AMS with anesthesia  . CVA 07/27/2008    CVA affected cognition and memory per family, no focal deficits.    . ESRD 07/27/2008    ESRD due to HTN and NSAID's, started hemodialysis in 2005 in [place], [place]. Went to [facility] from 2010 to 2012 and since 2012 has been getting dialysis at [facility] on [address] in [place] on a MWF schedule. First access with RUA AVG placed in [place]. Next and current access was LUA AVG placed by Dr. Wyn Quaker in [place] in or around 2012. Has had 2 or 3 procedures on that graft since placed per family. She gets her access work done here in [place] now. She is allergic to heparin and does not get any heparin at dialysis; she had an allergic reaction apparently when in ICU in the past.        Medications:  Anti-infectives   Start     Dose/Rate Route Frequency Ordered Stop   06/27/12 2115  metroNIDAZOLE (FLAGYL) IVPB 500 mg     500 mg 100 mL/hr over 60 Minutes Intravenous Every 8 hours 06/27/12 2103  06/27/12 2045  piperacillin-tazobactam (ZOSYN) IVPB 2.25 g     2.25 g 100 mL/hr over 30 Minutes Intravenous  Once 06/27/12 2043     06/27/12 2045  vancomycin (VANCOCIN) IVPB 1000 mg/200 mL premix     1,000 mg 200 mL/hr over 60 Minutes Intravenous  Once 06/27/12 2043       Assessment: 67 year old female to receive broad-spectrum antimicrobial therapy with Vancomycin, Cefepime, and Flagyl empirically for fevers and diarrhea.  She is HD-dependent and received dialysis on 6/25 but did not complete a full session.   She is currently receiving Vancomycin 1gm in the ED.  Zosyn was ordered but she is pencillin-allergic, so will go ahead and start her Cefepime at this time instead.  Goal of Therapy:  Pre-HD Vancomycin  level 15-25 mcg/ml  Plan:  Cefepime 2gm IV x 1 now, then 2gm IV after each HD session Give an additional 500mg  of Vancomycin for a total of 1500mg  loading dose Vancomycin 750mg  IV after each HD session Monitor for HD schedule and tolerance Follow available micro data  Estella Husk, Pharm.D., BCPS Clinical Pharmacist Phone [phone] or [phone] 06/27/2012, 9:46 PM

## 2012-06-28 DIAGNOSIS — E875 Hyperkalemia: Secondary | ICD-10-CM | POA: Diagnosis not present

## 2012-06-28 DIAGNOSIS — R4182 Altered mental status, unspecified: Secondary | ICD-10-CM | POA: Diagnosis not present

## 2012-06-28 DIAGNOSIS — D631 Anemia in chronic kidney disease: Secondary | ICD-10-CM | POA: Diagnosis not present

## 2012-06-28 DIAGNOSIS — I120 Hypertensive chronic kidney disease with stage 5 chronic kidney disease or end stage renal disease: Secondary | ICD-10-CM | POA: Diagnosis not present

## 2012-06-28 DIAGNOSIS — N186 End stage renal disease: Secondary | ICD-10-CM | POA: Diagnosis not present

## 2012-06-28 LAB — CBC
MCH: 28 pg (ref 26.0–34.0)
Platelets: 147 10*3/uL — ABNORMAL LOW (ref 150–400)
RBC: 4.04 MIL/uL (ref 3.87–5.11)
WBC: 8.1 10*3/uL (ref 4.0–10.5)

## 2012-06-28 LAB — POCT I-STAT, CHEM 8
BUN: 34 mg/dL — ABNORMAL HIGH (ref 6–23)
Chloride: 106 mEq/L (ref 96–112)
Sodium: 140 mEq/L (ref 135–145)
TCO2: 24 mmol/L (ref 0–100)

## 2012-06-28 LAB — BASIC METABOLIC PANEL
Calcium: 9 mg/dL (ref 8.4–10.5)
GFR calc non Af Amer: 4 mL/min — ABNORMAL LOW (ref >90)
Sodium: 140 mEq/L (ref 135–145)

## 2012-06-28 LAB — MRSA PCR SCREENING: MRSA by PCR: NEGATIVE

## 2012-06-28 LAB — POTASSIUM: Potassium: 6.2 mEq/L — ABNORMAL HIGH (ref 3.5–5.1)

## 2012-06-28 MED ORDER — DIPHENHYDRAMINE HCL 50 MG/ML IJ SOLN
12.5000 mg | Freq: Three times a day (TID) | INTRAMUSCULAR | Status: DC | PRN
  Administered 2012-06-28 – 2012-06-30 (×3): 12.5 mg via INTRAVENOUS
  Filled 2012-06-28 (×3): qty 1

## 2012-06-28 MED ORDER — SODIUM CHLORIDE 0.9 % IV SOLN: 100.0000 mL | INTRAVENOUS | Status: DC | PRN

## 2012-06-28 MED ORDER — SEVELAMER CARBONATE 800 MG PO TABS
1600.0000 mg | ORAL_TABLET | Freq: Every day | ORAL | Status: DC
  Administered 2012-06-28 – 2012-07-02 (×5): 1600 mg via ORAL
  Filled 2012-06-28 (×5): qty 2

## 2012-06-28 MED ORDER — NEPRO/CARBSTEADY PO LIQD: 237.0000 mL | ORAL | Status: DC | PRN

## 2012-06-28 MED ORDER — LIDOCAINE HCL (PF) 1 % IJ SOLN: 5.0000 mL | INTRAMUSCULAR | Status: DC | PRN

## 2012-06-28 MED ORDER — DOXERCALCIFEROL 4 MCG/2ML IV SOLN
4.0000 ug | INTRAVENOUS | Status: DC
  Administered 2012-06-28 – 2012-07-01 (×2): 4 ug via INTRAVENOUS
  Filled 2012-06-28: qty 2

## 2012-06-28 MED ORDER — RENA-VITE PO TABS
1.0000 | ORAL_TABLET | Freq: Every day | ORAL | Status: DC
  Administered 2012-06-28 – 2012-06-30 (×3): 1 via ORAL
  Filled 2012-06-28 (×5): qty 1

## 2012-06-28 MED ORDER — NEPRO/CARBSTEADY PO LIQD
237.0000 mL | Freq: Every day | ORAL | Status: DC | PRN
  Filled 2012-06-28: qty 237

## 2012-06-28 MED ORDER — DARBEPOETIN ALFA-POLYSORBATE 40 MCG/0.4ML IJ SOLN
40.0000 ug | INTRAMUSCULAR | Status: DC
  Administered 2012-06-28: 40 ug via INTRAVENOUS

## 2012-06-28 MED ORDER — LIDOCAINE-PRILOCAINE 2.5-2.5 % EX CREA: 1.0000 "application " | TOPICAL_CREAM | CUTANEOUS | Status: DC | PRN

## 2012-06-28 MED ORDER — ALTEPLASE 2 MG IJ SOLR
2.0000 mg | Freq: Once | INTRAMUSCULAR | Status: DC | PRN
  Filled 2012-06-28: qty 2

## 2012-06-28 MED ORDER — VANCOMYCIN HCL IN DEXTROSE 750-5 MG/150ML-% IV SOLN
750.0000 mg | INTRAVENOUS | Status: DC
Start: 2012-06-28 — End: 2012-06-30
  Administered 2012-06-28: 750 mg via INTRAVENOUS
  Filled 2012-06-28: qty 150

## 2012-06-28 MED ORDER — DEXTROSE 5 % IV SOLN
2.0000 g | INTRAVENOUS | Status: DC
  Administered 2012-06-28 – 2012-07-01 (×2): 2 g via INTRAVENOUS
  Filled 2012-06-28 (×4): qty 2

## 2012-06-28 MED ORDER — METRONIDAZOLE 500 MG PO TABS
500.0000 mg | ORAL_TABLET | Freq: Three times a day (TID) | ORAL | Status: DC
  Administered 2012-06-28 – 2012-06-29 (×3): 500 mg via ORAL
  Filled 2012-06-28 (×6): qty 1

## 2012-06-28 MED ORDER — WHITE PETROLATUM GEL
Status: DC | PRN
  Administered 2012-06-28: 0.2 via TOPICAL
  Filled 2012-06-28 (×2): qty 5

## 2012-06-28 MED ORDER — PENTAFLUOROPROP-TETRAFLUOROETH EX AERO: 1.0000 "application " | INHALATION_SPRAY | CUTANEOUS | Status: DC | PRN

## 2012-06-28 NOTE — Progress Notes (Signed)
INITIAL NUTRITION ASSESSMENT  DOCUMENTATION CODES Per approved criteria  -Not Applicable   INTERVENTION:  Nepro Carb Steady daily PRN (425 kcals, 19.1 gm protein per 8 fl oz bottle) RD to follow for nutrition care plan  NUTRITION DIAGNOSIS: Increased nutrient needs related ESRD on HD as evidenced by estimated nutrition needs  Goal: Oral intake with meals & supplements to meet >/= 90% of estimated nutrition needs  Monitor:  PO & supplemental intake, weight, labs, I/O's  Reason for Assessment: Health Hx  67 y.o. female  Admitting Dx: Encephalopathy acute  ASSESSMENT: Patient presented to ED with c/o fever, AMS, and diarrhea; patient also apparently didn't undergo a full dialysis session 6/25; in ED potassium was 6.2 with some peaking of T waves on EKG.  RD unable to obtain nutrition hx; patient confused at time of visitation; PO intake variable at 25-85% per flowsheet records; per weight readings, patient's weight fluctuates more than likely due to fluid; would benefit from addition of supplement on as needed basis ---> RD to order.  Height: Ht Readings from Last 1 Encounters:  02/29/12 5' 2.5" (1.588 m)    Weight: Wt Readings from Last 1 Encounters:  06/28/12 145 lb 11.6 oz (66.1 kg)    Ideal Body Weight: 110 lb  % Ideal Body Weight: 132%  Wt Readings from Last 10 Encounters:  06/28/12 145 lb 11.6 oz (66.1 kg)  02/29/12 138 lb (62.596 kg)  01/22/12 130 lb 11.7 oz (59.3 kg)  09/19/11 147 lb 12.8 oz (67.042 kg)  06/20/11 148 lb 9.6 oz (67.405 kg)  05/16/11 152 lb 1.9 oz (69 kg)  05/04/11 147 lb 12.8 oz (67.042 kg)  02/27/11 148 lb 13 oz (67.5 kg)  02/27/11 148 lb 13 oz (67.5 kg)  12/23/10 165 lb 9.1 oz (75.1 kg)    Usual Body Weight: 138 lb  % Usual Body Weight: 105%  BMI:  Body mass index is 26.21 kg/(m^2).  Estimated Nutritional Needs: Kcal: 1700-1900 Protein: 80-90 gm Fluid: 1200 ml  Skin: Intact  Diet Order: Renal 60/70-02-03-1198 ml  EDUCATION  NEEDS: -No education needs identified at this time   Intake/Output Summary (Last 24 hours) at 06/28/12 1425 Last data filed at 06/28/12 1245  Gross per 24 hour  Intake 578.75 ml  Output   2839 ml  Net -2260.25 ml    Labs:   Recent Labs Lab 06/27/12 1712  06/28/12 0051 06/28/12 0402 06/28/12 0408  NA 135  --   --  140 140  K 6.2*  < > 6.2* 4.9 4.9  CL 94*  --   --  106 101  CO2 25  --   --   --  24  BUN 36*  --   --  34* 37*  CREATININE 8.03*  --   --  9.60* 8.61*  CALCIUM 9.2  --   --   --  9.0  GLUCOSE 77  --   --  79 77  < > = values in this interval not displayed.  CBG (last 3)   Recent Labs  06/27/12 1708  GLUCAP 78    Scheduled Meds: . amLODipine  10 mg Oral Daily  . aspirin EC  81 mg Oral Daily  . carvedilol  25 mg Oral BID WC  . ceFEPime (MAXIPIME) IV  2 g Intravenous Q M,W,F-HD  . cinacalcet  30 mg Oral QHS  . darbepoetin (ARANESP) injection - DIALYSIS  40 mcg Intravenous Q Fri-HD  . doxercalciferol  4 mcg Intravenous Q  M,W,F-HD  . hydrALAZINE  50 mg Oral TID AC & HS  . levETIRAcetam  500 mg Oral Q2000  . levothyroxine  88 mcg Oral QAC breakfast  . metroNIDAZOLE  500 mg Oral Q8H  . mometasone-formoterol  2 puff Inhalation BID  . multivitamin  1 tablet Oral QHS  . sevelamer carbonate  1,600 mg Oral Q1200  . sevelamer carbonate  3,200 mg Oral BID WC  . simvastatin  20 mg Oral q1800  . sodium chloride  3 mL Intravenous Q12H  . vancomycin  750 mg Intravenous Q M,W,F-HD    Continuous Infusions: . sodium chloride 75 mL/hr at 06/28/12 1030    Past Medical History  Diagnosis Date  . CAD (coronary artery disease)     stent to RCA  . CVA (cerebral infarction) 2003    no apparent residual  . Hypothyroidism   . PVD (peripheral vascular disease)   . Hyperlipidemia   . Positive PPD     completed rifampin  . Diastolic congestive heart failure   . Hypertension   . COPD (chronic obstructive pulmonary disease) t  . Cancer     clear cell cancer,  kidney  . Complication of anesthesia 12/2010    pt is very confused, with AMS with anesthesia  . CVA 07/27/2008    CVA affected cognition and memory per family, no focal deficits.    . ESRD 07/27/2008    ESRD due to HTN and NSAID's, started hemodialysis in 2005 in [place], [place]. Went to [facility] from 2010 to 2012 and since 2012 has been getting dialysis at [facility] on [address] in [place] on a MWF schedule. First access with RUA AVG placed in [place]. Next and current access was LUA AVG placed by Dr. Wyn Quaker in [place] in or around 2012. Has had 2 or 3 procedures on that graft since placed per family. She gets her access work done here in [place] now. She is allergic to heparin and does not get any heparin at dialysis; she had an allergic reaction apparently when in ICU in the past.        Past Surgical History  Procedure Laterality Date  . Abdominal hysterectomy    . Cholecystectomy    . D&cs    . Right ankle repair    . Left nephrectomy    . Vascular surgery  11/2010    graft inserted to left arm    Maureen Chatters, RD, LDN Pager #: [phone] After-Hours Pager #: [phone]

## 2012-06-28 NOTE — Consult Note (Signed)
Reason for Consult: ESRD and ESRD related needs Referring Physician: Julian Reil- hospitalist  [name] is an 67 y.o. female with PMhx significant for HTN, hyperlipidemia and diffuse vascular dz- CAD, PAD and hx of CVA.  She also has ESRD dialysis at [facility] MWF- was done Tues/Wed this week but reports shortened tx on Wed due to machine clotting.  Pt was in her USOH until yesterday AM when was making breakfast but couldn't remember how to make anything- she went back to bed but woke up worse so family brought her in- she also was noted to have fever and diarrhea.  In the ER- K was noted to be 6.2 in te early evening- she was given kayexylate but then when was checked at 9 PM was 6.8- I was called after midnight and made arrangements for her to get HD emergently.  Pre HD K was then noted to be 4.8.  This AM she is still confused having trouble finding words.  Her initial head CT was negative- being ruled out for C diff and other infections per the hospitalists.     Dialyzes at [place] on MWF  Primary Nephrologist [place]. EDW unknown. HD [place] unknown, . [employer] info supposed to be coming from [place]  Past Medical History  Diagnosis Date  . CAD (coronary artery disease)     stent to RCA  . CVA (cerebral infarction) 2003    no apparent residual  . Hypothyroidism   . PVD (peripheral vascular disease)   . Hyperlipidemia   . Positive PPD     completed rifampin  . Diastolic congestive heart failure   . Hypertension   . COPD (chronic obstructive pulmonary disease) t  . Cancer     clear cell cancer, kidney  . Complication of anesthesia 12/2010    pt is very confused, with AMS with anesthesia  . CVA 07/27/2008    CVA affected cognition and memory per family, no focal deficits.    . ESRD 07/27/2008    ESRD due to HTN and NSAID's, started hemodialysis in 2005 in [place], [place]. Went to [facility] from 2010 to 2012 and since 2012 has been getting dialysis at [facility] on [address] in  [place] on a MWF schedule. First access with RUA AVG placed in [place]. Next and current access was LUA AVG placed by Dr. Wyn Quaker in [place] in or around 2012. Has had 2 or 3 procedures on that graft since placed per family. She gets her access work done here in [place] now. She is allergic to heparin and does not get any heparin at dialysis; she had an allergic reaction apparently when in ICU in the past.        Past Surgical History  Procedure Laterality Date  . Abdominal hysterectomy    . Cholecystectomy    . D&cs    . Right ankle repair    . Left nephrectomy    . Vascular surgery  11/2010    graft inserted to left arm    Family History  Problem Relation Age of Onset  . Heart disease Father   . Hypertension Mother   . Dementia Mother   . Coronary artery disease Sister     Social History:  reports that she has been smoking Cigarettes.  She has a 106 pack-year smoking history. She has never used smokeless tobacco. She reports that she does not drink alcohol or use illicit drugs.  Allergies:  Allergies  Allergen Reactions  . Iohexol Swelling and Other (  See Comments)    1970s; passed out and had facial/tongue swelling.  Requires 13-hour prep with prednisone and benadryl  . Ampicillin Hives and Rash  . Meperidine Hcl Swelling and Rash    Makes tongue swell  . Morphine Rash  . Penicillins Rash  . Valproic Acid And Related Other (See Comments)    Confusion   . Heparin     MDs told her not to take after reaction in ICU  . Pentazocine Lactate     Patient does not remember reaction to this med (Talwin).   . Phenytoin Other (See Comments)    Had reaction while in ICU; doesn't know.  MDs told her not to take ever again.  . Wellbutrin (Bupropion)     seizures  . Ace Inhibitors Rash  . Codeine Itching and Rash      Results for orders placed during the hospital encounter of 06/27/12 (from the past 48 hour(s))  GLUCOSE, CAPILLARY     Status: None   Collection Time     06/27/12  5:08 PM      Result Value Range   Glucose-Capillary 78  70 - 99 mg/dL  CBC     Status: None   Collection Time    06/27/12  5:12 PM      Result Value Range   WBC 6.8  4.0 - 10.5 K/uL   RBC 4.37  3.87 - 5.11 MIL/uL   Hemoglobin 12.1  12.0 - 15.0 g/dL   HCT 82.9  56.2 - 13.0 %   MCV 83.3  78.0 - 100.0 fL   MCH 27.7  26.0 - 34.0 pg   MCHC 33.2  30.0 - 36.0 g/dL   RDW 86.5  78.4 - 69.6 %   Platelets 162  150 - 400 K/uL  COMPREHENSIVE METABOLIC PANEL     Status: Abnormal   Collection Time    06/27/12  5:12 PM      Result Value Range   Sodium 135  135 - 145 mEq/L   Potassium 6.2 (*) 3.5 - 5.1 mEq/L   Chloride 94 (*) 96 - 112 mEq/L   CO2 25  19 - 32 mEq/L   Glucose, Bld 77  70 - 99 mg/dL   BUN 36 (*) 6 - 23 mg/dL   Creatinine, Ser 2.95 (*) 0.50 - 1.10 mg/dL   Calcium 9.2  8.4 - 28.4 mg/dL   Total Protein 6.8  6.0 - 8.3 g/dL   Albumin 3.5  3.5 - 5.2 g/dL   AST 15  0 - 37 U/L   ALT 10  0 - 35 U/L   Alkaline Phosphatase 89  39 - 117 U/L   Total Bilirubin 0.3  0.3 - 1.2 mg/dL   GFR calc non Af Amer 5 (*) >90 mL/min   GFR calc Af Amer 5 (*) >90 mL/min   Comment:            The eGFR has been calculated     using the CKD EPI equation.     This calculation has not been     validated in all clinical     situations.     eGFR's persistently     <90 mL/min signify     possible Chronic Kidney Disease.  ACETAMINOPHEN LEVEL     Status: None   Collection Time    06/27/12  6:05 PM      Result Value Range   Acetaminophen (Tylenol), Serum <15.0  10 - 30 ug/mL  Comment:            THERAPEUTIC CONCENTRATIONS VARY     SIGNIFICANTLY. A RANGE OF 10-30     ug/mL MAY BE AN EFFECTIVE     CONCENTRATION FOR MANY PATIENTS.     HOWEVER, SOME ARE BEST TREATED     AT CONCENTRATIONS OUTSIDE THIS     RANGE.     ACETAMINOPHEN CONCENTRATIONS     >150 ug/mL AT 4 HOURS AFTER     INGESTION AND >50 ug/mL AT 12     HOURS AFTER INGESTION ARE     OFTEN ASSOCIATED WITH TOXIC     REACTIONS.   SALICYLATE LEVEL     Status: Abnormal   Collection Time    06/27/12  6:05 PM      Result Value Range   Salicylate Lvl <2.0 (*) 2.8 - 20.0 mg/dL  CG4 I-STAT (LACTIC ACID)     Status: None   Collection Time    06/27/12  6:42 PM      Result Value Range   Lactic Acid, Venous 1.26  0.5 - 2.2 mmol/L  POTASSIUM     Status: Abnormal   Collection Time    06/27/12  9:12 PM      Result Value Range   Potassium 6.8 (*) 3.5 - 5.1 mEq/L   Comment: NO VISIBLE HEMOLYSIS     CRITICAL RESULT CALLED TO, READ BACK BY AND VERIFIED WITH:     Audley Hose RN [phone] EBANKS COLCLOUGH, S  POTASSIUM     Status: Abnormal   Collection Time    06/28/12 12:51 AM      Result Value Range   Potassium 6.2 (*) 3.5 - 5.1 mEq/L   Comment: SLIGHT HEMOLYSIS  MRSA PCR SCREENING     Status: None   Collection Time    06/28/12  2:12 AM      Result Value Range   MRSA by PCR NEGATIVE  NEGATIVE   Comment:            The GeneXpert MRSA Assay (FDA     approved for NASAL specimens     only), is one component of a     comprehensive MRSA colonization     surveillance program. It is not     intended to diagnose MRSA     infection nor to guide or     monitor treatment for     MRSA infections.  BASIC METABOLIC PANEL     Status: Abnormal   Collection Time    06/28/12  4:08 AM      Result Value Range   Sodium 140  135 - 145 mEq/L   Potassium 4.9  3.5 - 5.1 mEq/L   Comment: DELTA CHECK NOTED   Chloride 101  96 - 112 mEq/L   CO2 24  19 - 32 mEq/L   Glucose, Bld 77  70 - 99 mg/dL   BUN 37 (*) 6 - 23 mg/dL   Creatinine, Ser 0.45 (*) 0.50 - 1.10 mg/dL   Calcium 9.0  8.4 - 40.9 mg/dL   GFR calc non Af Amer 4 (*) >90 mL/min   GFR calc Af Amer 5 (*) >90 mL/min   Comment:            The eGFR has been calculated     using the CKD EPI equation.     This calculation has not been     validated in all clinical     situations.  eGFR's persistently     <90 mL/min signify     possible Chronic Kidney Disease.  CBC      Status: Abnormal   Collection Time    06/28/12  4:08 AM      Result Value Range   WBC 8.1  4.0 - 10.5 K/uL   RBC 4.04  3.87 - 5.11 MIL/uL   Hemoglobin 11.3 (*) 12.0 - 15.0 g/dL   HCT 96.2 (*) 95.2 - 84.1 %   MCV 83.7  78.0 - 100.0 fL   MCH 28.0  26.0 - 34.0 pg   MCHC 33.4  30.0 - 36.0 g/dL   RDW 32.4  40.1 - 02.7 %   Platelets 147 (*) 150 - 400 K/uL    Ct Head Wo Contrast  06/27/2012   *RADIOLOGY REPORT*  Clinical Data: Altered mental status  CT HEAD WITHOUT CONTRAST  Technique:  Contiguous axial images were obtained from the base of the skull through the vertex without contrast.  Comparison: CT 01/20/2012  Findings: Generalized atrophy unchanged.  Multiple areas of chronic ischemia including the cerebellum bilaterally, left frontal temporal lobe, left parietal lobe, and left occipital lobe.  No acute infarct.  Negative for hemorrhage or mass.  Calvarium is intact.  IMPRESSION: Atrophy and extensive chronic ischemia.  No acute abnormality.   Original Report Authenticated By: Janeece Riggers, M.D.   Dg Chest Port 1 View  06/27/2012   *RADIOLOGY REPORT*  Clinical Data: Chest pain  PORTABLE CHEST - 1 VIEW  Comparison: Prior chest x-ray 01/20/2012  Findings: Stable cardiomegaly with left ventricular prominence. Similar degree of mild pulmonary vascular congestion without overt edema.  Inspiratory volumes are low.  No focal airspace consolidation, pneumothorax or large effusion.  Atherosclerotic calcifications noted in the transverse aorta.  No acute osseous abnormality.  FLAIR stent graft noted in the right upper extremity.  IMPRESSION:  1.  Slightly low inspiratory volumes, otherwise no acute cardiopulmonary disease. 2.  Stable cardiomegaly with left ventricular prominence and chronic vascular congestion.   Original Report Authenticated By: Malachy Moan, M.D.    ROS: Difficult to obtain secondary to pt not being able to find words- says is "sick"- was initially complaining of pain in her access,  now better- denies other pain-  Is having diarrhea- no abdominal pain- is having nonproductive cough.  The remainder of the ROS is negative  Blood pressure 122/61, pulse 94, temperature 99.9 F (37.7 C), temperature source Oral, resp. rate 17, weight 66.815 kg (147 lb 4.8 oz), SpO2 100.00%. General appearance: alert and delirious Eyes: conjunctivae/corneas clear. PERRL, EOM's intact. Fundi benign. Neck: no adenopathy, no carotid bruit, no JVD, supple, symmetrical, trachea midline and thyroid not enlarged, symmetric, no tenderness/mass/nodules Resp: diminished breath sounds bilaterally Cardio: regular rate and rhythm, S1, S2 normal, no murmur, click, rub or gallop GI: abnormal findings:  hyperactive bowel sounds and mild tenderness to palpation Extremities: extremities normal, atraumatic, no cyanosis or edema Neurologic: Mental status: alertness: alert some aphasia- repeating herself-( does have a little of these issues at baseline but this is definitely worse)  Assessment/Plan: 67 year old BF with ESRD- now admitted with fever, AMS and diarrhea-  1 Fever/AMS/diarrhea- being ruled out for infection and C diff per primary team- placed on cefepime, flagyl and vanc. Initial HCT was negative.  2 ESRD: normally MWF via [facility]- had HD Tues and Wed this week so far.  Thought to have hyperkalemia but apparently this was not the case- getting her regular Friday HD early this AM.  I could not get into [facility] this AM so have asked [facility] to send records 3 Hypertension: BP was initially very high, now with 3 liters of UF is better- continue norvasc/coreg and hydralazine  4. Anemia of ESRD: well controlled for now- no ESA and follow 5. Metabolic Bone Disease: continue pt on her home dose of sensipar and renvela   Davarious Tumbleson A 06/28/2012, 7:25 AM

## 2012-06-28 NOTE — Progress Notes (Signed)
[facility] PROGRESS NOTE  [name] RUE:[id number] DOB: 08-16-1945 DOA: 06/27/2012 PCP: [name], MD  Assessment/Plan: Principal Problem:   Encephalopathy acute Active Problems:   Essential hypertension, malignant   ESRD   Hyperkalemia, diminished renal excretion      1 Fever/AMS/diarrhea-infectious etiology? being ruled out for infection and C diff per, empirically placed on cefepime, flagyl and vanc. Initial HCT was negative. DC antibiotics if blood culture negative 2 ESRD: normally MWF via [facility]- had HD Tues and Wed this week so far. Thought to have hyperkalemia but apparently this was not the case- getting her regular Friday HD early this AM. 3 Hypertension: BP was initially very high, now with 3 liters of UF is better- continue norvasc/coreg and hydralazine  4. Anemia of ESRD: well controlled for now- no ESA and follow  5. Metabolic Bone Disease: continue pt on her home dose of sensipar and renvela   Code Status: full Family Communication: family updated about patient's clinical progress Disposition Plan:  As above    Brief narrative: 67 y.o. female with PMhx significant for HTN, hyperlipidemia and diffuse vascular dz- CAD, PAD and hx of CVA. She also has ESRD dialysis at [facility] MWF- was done Tues/Wed this week but reports shortened tx on Wed due to machine clotting. Pt was in her USOH until yesterday AM when was making breakfast but couldn't remember how to make anything- she went back to bed but woke up worse so family brought her in- she also was noted to have fever and diarrhea. In the ER- K was noted to be 6.2 in te early evening- she was given kayexylate but then when was checked at 9 PM was 6.8- I was called after midnight and made arrangements for her to get HD emergently. Pre HD K was then noted to be 4.8. This AM she is still confused having trouble finding words. Her initial head CT was negative- being ruled out for C diff and other infections per the  hospitalists   Consultants:  neprology  Procedures:  None  Antibiotics:  None  HPI/Subjective: Feels better this am and close to baseline mentally. Still with diarrhea  Objective: Filed Vitals:   06/28/12 0822 06/28/12 0830 06/28/12 0857 06/28/12 0935  BP: 156/59 134/63 114/69 135/74  Pulse: 82 79 85 76  Temp:   97.8 F (36.6 C) 98.8 F (37.1 C)  TempSrc:   Oral Oral  Resp:   19 18  Weight:   66.1 kg (145 lb 11.6 oz)   SpO2:   100% 100%    Intake/Output Summary (Last 24 hours) at 06/28/12 1058 Last data filed at 06/28/12 0857  Gross per 24 hour  Intake 278.75 ml  Output   2839 ml  Net -2560.25 ml    Exam:  JYN:WGNFA and alert  CVS:RRR  Resp:scattered rhonchi  Abd:+ BS NTND  Ext:no edema Lt Hero graft  NEURO:Ox3. CNI moves all extremities   Data Reviewed: Basic Metabolic Panel:  Recent Labs Lab 06/27/12 1712 06/27/12 2112 06/28/12 0051 06/28/12 0402 06/28/12 0408  NA 135  --   --  140 140  K 6.2* 6.8* 6.2* 4.9 4.9  CL 94*  --   --  106 101  CO2 25  --   --   --  24  GLUCOSE 77  --   --  79 77  BUN 36*  --   --  34* 37*  CREATININE 8.03*  --   --  9.60* 8.61*  CALCIUM 9.2  --   --   --  9.0    Liver Function Tests:  Recent Labs Lab 06/27/12 1712  AST 15  ALT 10  ALKPHOS 89  BILITOT 0.3  PROT 6.8  ALBUMIN 3.5   No results found for this basename: LIPASE, AMYLASE,  in the last 168 hours No results found for this basename: AMMONIA,  in the last 168 hours  CBC:  Recent Labs Lab 06/27/12 1712 06/28/12 0402 06/28/12 0408  WBC 6.8  --  8.1  HGB 12.1 11.6* 11.3*  HCT 36.4 34.0* 33.8*  MCV 83.3  --  83.7  PLT 162  --  147*    Cardiac Enzymes: No results found for this basename: CKTOTAL, CKMB, CKMBINDEX, TROPONINI,  in the last 168 hours BNP (last 3 results) No results found for this basename: PROBNP,  in the last 8760 hours   CBG:  Recent Labs Lab 06/27/12 1708  GLUCAP 78    Recent Results (from the past 240  hour(s))  CLOSTRIDIUM DIFFICILE BY PCR     Status: None   Collection Time    06/27/12  9:22 PM      Result Value Range Status   C difficile by pcr NEGATIVE  NEGATIVE Final  MRSA PCR SCREENING     Status: None   Collection Time    06/28/12  2:12 AM      Result Value Range Status   MRSA by PCR NEGATIVE  NEGATIVE Final   Comment:            The GeneXpert MRSA Assay (FDA     approved for NASAL specimens     only), is one component of a     comprehensive MRSA colonization     surveillance program. It is not     intended to diagnose MRSA     infection nor to guide or     monitor treatment for     MRSA infections.     Studies: Ct Head Wo Contrast  06/27/2012   *RADIOLOGY REPORT*  Clinical Data: Altered mental status  CT HEAD WITHOUT CONTRAST  Technique:  Contiguous axial images were obtained from the base of the skull through the vertex without contrast.  Comparison: CT 01/20/2012  Findings: Generalized atrophy unchanged.  Multiple areas of chronic ischemia including the cerebellum bilaterally, left frontal temporal lobe, left parietal lobe, and left occipital lobe.  No acute infarct.  Negative for hemorrhage or mass.  Calvarium is intact.  IMPRESSION: Atrophy and extensive chronic ischemia.  No acute abnormality.   Original Report Authenticated By: Janeece Riggers, M.D.   Dg Chest Port 1 View  06/27/2012   *RADIOLOGY REPORT*  Clinical Data: Chest pain  PORTABLE CHEST - 1 VIEW  Comparison: Prior chest x-ray 01/20/2012  Findings: Stable cardiomegaly with left ventricular prominence. Similar degree of mild pulmonary vascular congestion without overt edema.  Inspiratory volumes are low.  No focal airspace consolidation, pneumothorax or large effusion.  Atherosclerotic calcifications noted in the transverse aorta.  No acute osseous abnormality.  FLAIR stent graft noted in the right upper extremity.  IMPRESSION:  1.  Slightly low inspiratory volumes, otherwise no acute cardiopulmonary disease. 2.  Stable  cardiomegaly with left ventricular prominence and chronic vascular congestion.   Original Report Authenticated By: Malachy Moan, M.D.    Scheduled Meds: . amLODipine  10 mg Oral Daily  . aspirin EC  81 mg Oral Daily  . carvedilol  25 mg Oral BID WC  . ceFEPime (MAXIPIME) IV  2 g Intravenous Q M,W,F-HD  .  cinacalcet  30 mg Oral QHS  . darbepoetin (ARANESP) injection - DIALYSIS  40 mcg Intravenous Q Fri-HD  . doxercalciferol  4 mcg Intravenous Q M,W,F-HD  . hydrALAZINE  50 mg Oral TID AC & HS  . levETIRAcetam  500 mg Oral Q2000  . levothyroxine  88 mcg Oral QAC breakfast  . metronidazole  500 mg Intravenous Q8H  . mometasone-formoterol  2 puff Inhalation BID  . multivitamin  1 tablet Oral QHS  . sevelamer carbonate  1,600 mg Oral Q1200  . sevelamer carbonate  3,200 mg Oral BID WC  . simvastatin  20 mg Oral q1800  . sodium chloride  3 mL Intravenous Q12H  . vancomycin  750 mg Intravenous Q M,W,F-HD   Continuous Infusions: . sodium chloride 75 mL/hr at 06/28/12 1030    Principal Problem:   Encephalopathy acute Active Problems:   Essential hypertension, malignant   ESRD   Hyperkalemia, diminished renal excretion    Time spent: 40 minutes   [facility]  [facility] Pager [phone]. If 8PM-8AM, please contact night-coverage at [email or web address], password [facility] 06/28/2012, 10:58 AM  LOS: 1 day

## 2012-06-28 NOTE — Progress Notes (Signed)
Pt admitted with K=6.8 per ED; upon arrival to the unit recheck K with istat; K=4.9. Called and notified Dr. Marjory Sneddon she ordered to change the Acid bath to 2K and set the goal on 4-5L d/t high BPs. canulated pt without problems; 30 mins into the tx pt c/o of pain on the arterial access, site assessed no problems noted. Pt continued to c/o pain , needle removed and recanulated flushing good and pt denies pain when flushed. After needle was taped pt c/o of pain again, no problem noted, access flushing good. Removed the needle and canulated pt for a third time, tx started and pt states it stiill hurts a little, no problem noted on the access and tx was resumed. Pt resting well and not complaining of pain in the arm. Pt is anxious and has had 4 bowel movements.

## 2012-06-28 NOTE — Progress Notes (Signed)
Utilization review completed.  P.J. Kadin Canipe,RN,BSN Case Manager [phone]  

## 2012-06-28 NOTE — Progress Notes (Signed)
S: Feels better this am and close to baseline mentally.  Still with diarrhea.  Pt seen on HD Ap 140 Vp 260 on no heparin due to allergy O:BP 156/59  Pulse 82  Temp(Src) 99.9 F (37.7 C) (Oral)  Resp 17  Wt 66.815 kg (147 lb 4.8 oz)  BMI 26.5 kg/m2  SpO2 100%  Intake/Output Summary (Last 24 hours) at 06/28/12 1610 Last data filed at 06/28/12 0131  Gross per 24 hour  Intake 278.75 ml  Output    250 ml  Net  28.75 ml   Weight change:  RUE:AVWUJ and alert CVS:RRR Resp:scattered rhonchi Abd:+ BS NTND Ext:no edema Lt Hero graft NEURO:Ox3. CNI moves all extremities   . amLODipine  10 mg Oral Daily  . aspirin EC  81 mg Oral Daily  . carvedilol  25 mg Oral BID WC  . ceFEPime (MAXIPIME) IV  2 g Intravenous Q M,W,F-HD  . cinacalcet  30 mg Oral QHS  . hydrALAZINE  50 mg Oral TID AC & HS  . levETIRAcetam  500 mg Oral Q2000  . levothyroxine  88 mcg Oral QAC breakfast  . metronidazole  500 mg Intravenous Q8H  . mometasone-formoterol  2 puff Inhalation BID  . sevelamer carbonate  1,600 mg Oral Q1200  . sevelamer carbonate  3,200 mg Oral BID WC  . simvastatin  20 mg Oral q1800  . sodium chloride  3 mL Intravenous Q12H  . vancomycin  750 mg Intravenous Q M,W,F-HD   Ct Head Wo Contrast  06/27/2012   *RADIOLOGY REPORT*  Clinical Data: Altered mental status  CT HEAD WITHOUT CONTRAST  Technique:  Contiguous axial images were obtained from the base of the skull through the vertex without contrast.  Comparison: CT 01/20/2012  Findings: Generalized atrophy unchanged.  Multiple areas of chronic ischemia including the cerebellum bilaterally, left frontal temporal lobe, left parietal lobe, and left occipital lobe.  No acute infarct.  Negative for hemorrhage or mass.  Calvarium is intact.  IMPRESSION: Atrophy and extensive chronic ischemia.  No acute abnormality.   Original Report Authenticated By: Janeece Riggers, M.D.   Dg Chest Port 1 View  06/27/2012   *RADIOLOGY REPORT*  Clinical Data: Chest pain   PORTABLE CHEST - 1 VIEW  Comparison: Prior chest x-ray 01/20/2012  Findings: Stable cardiomegaly with left ventricular prominence. Similar degree of mild pulmonary vascular congestion without overt edema.  Inspiratory volumes are low.  No focal airspace consolidation, pneumothorax or large effusion.  Atherosclerotic calcifications noted in the transverse aorta.  No acute osseous abnormality.  FLAIR stent graft noted in the right upper extremity.  IMPRESSION:  1.  Slightly low inspiratory volumes, otherwise no acute cardiopulmonary disease. 2.  Stable cardiomegaly with left ventricular prominence and chronic vascular congestion.   Original Report Authenticated By: Malachy Moan, M.D.   BMET    Component Value Date/Time   NA 140 06/28/2012 0408   K 4.9 06/28/2012 0408   CL 101 06/28/2012 0408   CO2 24 06/28/2012 0408   GLUCOSE 77 06/28/2012 0408   BUN 37* 06/28/2012 0408   CREATININE 8.61* 06/28/2012 0408   CALCIUM 9.0 06/28/2012 0408   GFRNONAA 4* 06/28/2012 0408   GFRAA 5* 06/28/2012 0408   CBC    Component Value Date/Time   WBC 8.1 06/28/2012 0408   RBC 4.04 06/28/2012 0408   HGB 11.3* 06/28/2012 0408   HCT 33.8* 06/28/2012 0408   PLT 147* 06/28/2012 0408   MCV 83.7 06/28/2012 0408   MCH 28.0 06/28/2012  0408   MCHC 33.4 06/28/2012 0408   RDW 15.0 06/28/2012 0408   LYMPHSABS 1.4 01/20/2012 1546   MONOABS 0.4 01/20/2012 1546   EOSABS 0.2 01/20/2012 1546   BASOSABS 0.0 01/20/2012 1546     Assessment: 1. AMS, improved 2. Diarrhea, RO c. Diff 3. Sec HPTH  4. Anemia 5. HTN 6. DM 7. ESRD 4 hr bfr 350 2k/2ca  16g needles  dw 65.5 Plan: 1. Await c diff 2. Cont ab 3 resume aranesp 4. Resume hectorol 5. Start rena vite   Ayelen Sciortino T

## 2012-06-29 DIAGNOSIS — N186 End stage renal disease: Secondary | ICD-10-CM | POA: Diagnosis not present

## 2012-06-29 DIAGNOSIS — D631 Anemia in chronic kidney disease: Secondary | ICD-10-CM | POA: Diagnosis not present

## 2012-06-29 DIAGNOSIS — E875 Hyperkalemia: Secondary | ICD-10-CM | POA: Diagnosis not present

## 2012-06-29 DIAGNOSIS — I120 Hypertensive chronic kidney disease with stage 5 chronic kidney disease or end stage renal disease: Secondary | ICD-10-CM | POA: Diagnosis not present

## 2012-06-29 DIAGNOSIS — R4182 Altered mental status, unspecified: Secondary | ICD-10-CM | POA: Diagnosis not present

## 2012-06-29 LAB — GLUCOSE, CAPILLARY
Glucose-Capillary: 150 mg/dL — ABNORMAL HIGH (ref 70–99)
Glucose-Capillary: 93 mg/dL (ref 70–99)

## 2012-06-29 NOTE — Progress Notes (Signed)
S: Diarrhea better O:BP 149/70  Pulse 72  Temp(Src) 98.8 F (37.1 C) (Oral)  Resp 18  Wt 69.582 kg (153 lb 6.4 oz)  BMI 27.59 kg/m2  SpO2 100%  Intake/Output Summary (Last 24 hours) at 06/29/12 4696 Last data filed at 06/28/12 2952  Gross per 24 hour  Intake    420 ml  Output   2589 ml  Net  -2169 ml   Weight change: -0.715 kg (-1 lb 9.2 oz) WUX:LKGMW and alert CVS:RRR Resp:clear Abd:+ BS NTND Ext:no edema Lt Hero graft + bruit NEURO:Ox3. CNI moves all extremities, CNI   . amLODipine  10 mg Oral Daily  . aspirin EC  81 mg Oral Daily  . carvedilol  25 mg Oral BID WC  . ceFEPime (MAXIPIME) IV  2 g Intravenous Q M,W,F-HD  . cinacalcet  30 mg Oral QHS  . darbepoetin (ARANESP) injection - DIALYSIS  40 mcg Intravenous Q Fri-HD  . doxercalciferol  4 mcg Intravenous Q M,W,F-HD  . hydrALAZINE  50 mg Oral TID AC & HS  . levETIRAcetam  500 mg Oral Q2000  . levothyroxine  88 mcg Oral QAC breakfast  . metroNIDAZOLE  500 mg Oral Q8H  . mometasone-formoterol  2 puff Inhalation BID  . multivitamin  1 tablet Oral QHS  . sevelamer carbonate  1,600 mg Oral Q1200  . sevelamer carbonate  3,200 mg Oral BID WC  . simvastatin  20 mg Oral q1800  . sodium chloride  3 mL Intravenous Q12H  . vancomycin  750 mg Intravenous Q M,W,F-HD   Ct Head Wo Contrast  06/27/2012   *RADIOLOGY REPORT*  Clinical Data: Altered mental status  CT HEAD WITHOUT CONTRAST  Technique:  Contiguous axial images were obtained from the base of the skull through the vertex without contrast.  Comparison: CT 01/20/2012  Findings: Generalized atrophy unchanged.  Multiple areas of chronic ischemia including the cerebellum bilaterally, left frontal temporal lobe, left parietal lobe, and left occipital lobe.  No acute infarct.  Negative for hemorrhage or mass.  Calvarium is intact.  IMPRESSION: Atrophy and extensive chronic ischemia.  No acute abnormality.   Original Report Authenticated By: Janeece Riggers, M.D.   Dg Chest Port 1  View  06/27/2012   *RADIOLOGY REPORT*  Clinical Data: Chest pain  PORTABLE CHEST - 1 VIEW  Comparison: Prior chest x-ray 01/20/2012  Findings: Stable cardiomegaly with left ventricular prominence. Similar degree of mild pulmonary vascular congestion without overt edema.  Inspiratory volumes are low.  No focal airspace consolidation, pneumothorax or large effusion.  Atherosclerotic calcifications noted in the transverse aorta.  No acute osseous abnormality.  FLAIR stent graft noted in the right upper extremity.  IMPRESSION:  1.  Slightly low inspiratory volumes, otherwise no acute cardiopulmonary disease. 2.  Stable cardiomegaly with left ventricular prominence and chronic vascular congestion.   Original Report Authenticated By: Malachy Moan, M.D.   BMET    Component Value Date/Time   NA 140 06/28/2012 0408   K 4.9 06/28/2012 0408   CL 101 06/28/2012 0408   CO2 24 06/28/2012 0408   GLUCOSE 77 06/28/2012 0408   BUN 37* 06/28/2012 0408   CREATININE 8.61* 06/28/2012 0408   CALCIUM 9.0 06/28/2012 0408   GFRNONAA 4* 06/28/2012 0408   GFRAA 5* 06/28/2012 0408   CBC    Component Value Date/Time   WBC 8.1 06/28/2012 0408   RBC 4.04 06/28/2012 0408   HGB 11.3* 06/28/2012 0408   HCT 33.8* 06/28/2012 0408   PLT 147*  06/28/2012 0408   MCV 83.7 06/28/2012 0408   MCH 28.0 06/28/2012 0408   MCHC 33.4 06/28/2012 0408   RDW 15.0 06/28/2012 0408   LYMPHSABS 1.4 01/20/2012 1546   MONOABS 0.4 01/20/2012 1546   EOSABS 0.2 01/20/2012 1546   BASOSABS 0.0 01/20/2012 1546     Assessment: 1. AMS, improved 2. Diarrhea,C diff -, improved 3. Sec HPTH on hectorol 4. Anemia on aranesp 5. HTN 6. DM 7. ESRD 4 hr bfr 350 2k/2ca  16g needles  dw 65.5 Plan: 1. DC IV fluids 2. Await BC 3. Increase ambulation  Rondi Ivy T

## 2012-06-29 NOTE — ED Provider Notes (Signed)
  I performed a history and physical examination of Peyton Rossner and discussed her management with Ms. Piepenbrink.  I agree with the history, physical, assessment, and plan of care, with the following exceptions: None  I was present for the following procedures: None Time Spent in Critical Care of the patient: None Time spent in discussions with the patient and family: 10  Gaynor Ferreras S  67 y.o. Female with esrd, dialysed yesterday was well this am then began complaining of weakness, difficulty speaking and generally not feeling well and has been having loose stools- her daughter states these are not unusual symptoms for her.  She appears ill and has a fever.  Sheis having cultures drawn and no source of infection noted here in ed.  CXR clear, skin without erythema or rash, abdomen soft and nontender.  She does not make urine.  Given her immunocompromised status she is started empirically on abx.  Lactic acid normal.  Potassium elevated at 6.2 with normal ekg and kayexalate given.   She will be admitted for further assessment and treatment.   Hilario Quarry, MD 06/29/12 [phone]

## 2012-06-29 NOTE — Progress Notes (Signed)
[facility] PROGRESS NOTE  [name] ZOX:[id number] DOB: [birth year] DOA: 06/27/2012 PCP: [name], MD  Assessment/Plan: Principal Problem:   Encephalopathy acute Active Problems:   Essential hypertension, malignant   ESRD   Hyperkalemia, diminished renal excretion    1 Fever/AMS/diarrhea-infectious etiology?follow blood cultures, being ruled out for infection and C diff negative, therefore discontinue metronidazole,started on  on cefepime, vancomycin empirically. CT head was negative. DC antibiotics if blood culture negative . 2 ESRD: normally MWF via [facility]- had HD Tues and Wed this week so far. Hyperkalemia improving 3. Hypertension: BP was initially very high, now with 3 liters of UF is better- continue norvasc/coreg and hydralazine  4. Anemia of ESRD: well controlled for now- no ESA and follow  5. Metabolic Bone Disease: continue pt on her home dose of sensipar and renvela    Code Status: full  Family Communication: family updated about patient's clinical progress  Disposition Plan: PT eval/OT eval  Brief narrative:  67 y.o. female with PMhx significant for HTN, hyperlipidemia and diffuse vascular dz- CAD, PAD and hx of CVA. She also has ESRD dialysis at [facility] MWF- was done Tues/Wed this week but reports shortened tx on Wed due to machine clotting. Pt was in her USOH until yesterday AM when was making breakfast but couldn't remember how to make anything- she went back to bed but woke up worse so family brought her in- she also was noted to have fever and diarrhea. In the ER- K was noted to be 6.2 in te early evening- she was given kayexylate but then when was checked at 9 PM was 6.8- I was called after midnight and made arrangements for her to get HD emergently. Pre HD K was then noted to be 4.8. This AM she is still confused having trouble finding words. Her initial head CT was negative- being ruled out for C diff and other infections per the hospitalists  Consultants:   neprology Procedures:  None Antibiotics:  Cefepime/vancomycin-6/26-  HPI/Subjective:  Was complaining of itching yesterday, which has improved Mental status back to baseline    Objective: Filed Vitals:   06/28/12 1435 06/28/12 2007 06/28/12 2100 06/29/12 0500  BP: 134/60  152/79 149/70  Pulse: 72  66 72  Temp: 98.6 F (37 C)  98.2 F (36.8 C) 98.8 F (37.1 C)  TempSrc: Oral     Resp: 18  22 18   Weight:    69.582 kg (153 lb 6.4 oz)  SpO2: 100% 100% 95% 100%    Intake/Output Summary (Last 24 hours) at 06/29/12 0816 Last data filed at 06/28/12 1852  Gross per 24 hour  Intake    420 ml  Output   2589 ml  Net  -2169 ml    Exam: WJX:BJYNW and alert  CVS:RRR  Resp:clear  Abd:+ BS NTND  Ext:no edema Lt Hero graft + bruit  NEURO:Ox3. CNI moves all extremities, CNI     Data Reviewed: Basic Metabolic Panel:  Recent Labs Lab 06/27/12 1712 06/27/12 2112 06/28/12 0051 06/28/12 0402 06/28/12 0408  NA 135  --   --  140 140  K 6.2* 6.8* 6.2* 4.9 4.9  CL 94*  --   --  106 101  CO2 25  --   --   --  24  GLUCOSE 77  --   --  79 77  BUN 36*  --   --  34* 37*  CREATININE 8.03*  --   --  9.60* 8.61*  CALCIUM 9.2  --   --   --  9.0    Liver Function Tests:  Recent Labs Lab 06/27/12 1712  AST 15  ALT 10  ALKPHOS 89  BILITOT 0.3  PROT 6.8  ALBUMIN 3.5   No results found for this basename: LIPASE, AMYLASE,  in the last 168 hours No results found for this basename: AMMONIA,  in the last 168 hours  CBC:  Recent Labs Lab 06/27/12 1712 06/28/12 0402 06/28/12 0408  WBC 6.8  --  8.1  HGB 12.1 11.6* 11.3*  HCT 36.4 34.0* 33.8*  MCV 83.3  --  83.7  PLT 162  --  147*    Cardiac Enzymes: No results found for this basename: CKTOTAL, CKMB, CKMBINDEX, TROPONINI,  in the last 168 hours BNP (last 3 results) No results found for this basename: PROBNP,  in the last 8760 hours   CBG:  Recent Labs Lab 06/27/12 1708 06/29/12 0804  GLUCAP 78 150*     Recent Results (from the past 240 hour(s))  CLOSTRIDIUM DIFFICILE BY PCR     Status: None   Collection Time    06/27/12  9:22 PM      Result Value Range Status   C difficile by pcr NEGATIVE  NEGATIVE Final  MRSA PCR SCREENING     Status: None   Collection Time    06/28/12  2:12 AM      Result Value Range Status   MRSA by PCR NEGATIVE  NEGATIVE Final   Comment:            The GeneXpert MRSA Assay (FDA     approved for NASAL specimens     only), is one component of a     comprehensive MRSA colonization     surveillance program. It is not     intended to diagnose MRSA     infection nor to guide or     monitor treatment for     MRSA infections.     Studies: Ct Head Wo Contrast  06/27/2012   *RADIOLOGY REPORT*  Clinical Data: Altered mental status  CT HEAD WITHOUT CONTRAST  Technique:  Contiguous axial images were obtained from the base of the skull through the vertex without contrast.  Comparison: CT 01/20/2012  Findings: Generalized atrophy unchanged.  Multiple areas of chronic ischemia including the cerebellum bilaterally, left frontal temporal lobe, left parietal lobe, and left occipital lobe.  No acute infarct.  Negative for hemorrhage or mass.  Calvarium is intact.  IMPRESSION: Atrophy and extensive chronic ischemia.  No acute abnormality.   Original Report Authenticated By: Janeece Riggers, M.D.   Dg Chest Port 1 View  06/27/2012   *RADIOLOGY REPORT*  Clinical Data: Chest pain  PORTABLE CHEST - 1 VIEW  Comparison: Prior chest x-ray 01/20/2012  Findings: Stable cardiomegaly with left ventricular prominence. Similar degree of mild pulmonary vascular congestion without overt edema.  Inspiratory volumes are low.  No focal airspace consolidation, pneumothorax or large effusion.  Atherosclerotic calcifications noted in the transverse aorta.  No acute osseous abnormality.  FLAIR stent graft noted in the right upper extremity.  IMPRESSION:  1.  Slightly low inspiratory volumes, otherwise no  acute cardiopulmonary disease. 2.  Stable cardiomegaly with left ventricular prominence and chronic vascular congestion.   Original Report Authenticated By: Malachy Moan, M.D.    Scheduled Meds: . amLODipine  10 mg Oral Daily  . aspirin EC  81 mg Oral Daily  . carvedilol  25 mg Oral BID WC  . ceFEPime (MAXIPIME) IV  2 g Intravenous Q  M,W,F-HD  . cinacalcet  30 mg Oral QHS  . darbepoetin (ARANESP) injection - DIALYSIS  40 mcg Intravenous Q Fri-HD  . doxercalciferol  4 mcg Intravenous Q M,W,F-HD  . hydrALAZINE  50 mg Oral TID AC & HS  . levETIRAcetam  500 mg Oral Q2000  . levothyroxine  88 mcg Oral QAC breakfast  . mometasone-formoterol  2 puff Inhalation BID  . multivitamin  1 tablet Oral QHS  . sevelamer carbonate  1,600 mg Oral Q1200  . sevelamer carbonate  3,200 mg Oral BID WC  . simvastatin  20 mg Oral q1800  . sodium chloride  3 mL Intravenous Q12H  . vancomycin  750 mg Intravenous Q M,W,F-HD   Continuous Infusions:   Principal Problem:   Encephalopathy acute Active Problems:   Essential hypertension, malignant   ESRD   Hyperkalemia, diminished renal excretion    Time spent: 40 minutes   [facility]  [facility] Pager [phone]. If 8PM-8AM, please contact night-coverage at [email or web address], password [facility] 06/29/2012, 8:16 AM  LOS: 2 days

## 2012-06-30 DIAGNOSIS — I120 Hypertensive chronic kidney disease with stage 5 chronic kidney disease or end stage renal disease: Secondary | ICD-10-CM | POA: Diagnosis not present

## 2012-06-30 DIAGNOSIS — N039 Chronic nephritic syndrome with unspecified morphologic changes: Secondary | ICD-10-CM | POA: Diagnosis not present

## 2012-06-30 DIAGNOSIS — N186 End stage renal disease: Secondary | ICD-10-CM | POA: Diagnosis not present

## 2012-06-30 DIAGNOSIS — E875 Hyperkalemia: Secondary | ICD-10-CM | POA: Diagnosis not present

## 2012-06-30 DIAGNOSIS — R4182 Altered mental status, unspecified: Secondary | ICD-10-CM

## 2012-06-30 LAB — CBC WITH DIFFERENTIAL/PLATELET
Basophils Absolute: 0 10*3/uL (ref 0.0–0.1)
Basophils Relative: 1 % (ref 0–1)
Eosinophils Absolute: 0.2 10*3/uL (ref 0.0–0.7)
Eosinophils Relative: 3 % (ref 0–5)
HCT: 32.7 % — ABNORMAL LOW (ref 36.0–46.0)
Hemoglobin: 10.7 g/dL — ABNORMAL LOW (ref 12.0–15.0)
RBC: 3.9 MIL/uL (ref 3.87–5.11)
WBC: 4.9 10*3/uL (ref 4.0–10.5)

## 2012-06-30 LAB — COMPREHENSIVE METABOLIC PANEL
ALT: 8 U/L (ref 0–35)
AST: 15 U/L (ref 0–37)
Albumin: 2.8 g/dL — ABNORMAL LOW (ref 3.5–5.2)
CO2: 23 mEq/L (ref 19–32)
Calcium: 9.2 mg/dL (ref 8.4–10.5)
GFR calc non Af Amer: 3 mL/min — ABNORMAL LOW (ref >90)
Sodium: 135 mEq/L (ref 135–145)
Total Protein: 5.8 g/dL — ABNORMAL LOW (ref 6.0–8.3)

## 2012-06-30 LAB — GLUCOSE, CAPILLARY: Glucose-Capillary: 81 mg/dL (ref 70–99)

## 2012-06-30 MED ORDER — DIPHENHYDRAMINE HCL 25 MG PO CAPS
25.0000 mg | ORAL_CAPSULE | Freq: Four times a day (QID) | ORAL | Status: DC | PRN
  Administered 2012-07-01: 25 mg via ORAL

## 2012-06-30 MED ORDER — MIDAZOLAM HCL 2 MG/2ML IJ SOLN: 2.0000 mg | Freq: Once | INTRAMUSCULAR | Status: DC

## 2012-06-30 MED ORDER — LORAZEPAM 1 MG PO TABS
1.0000 mg | ORAL_TABLET | Freq: Four times a day (QID) | ORAL | Status: DC | PRN
  Administered 2012-06-30: 1 mg via ORAL
  Filled 2012-06-30: qty 1

## 2012-06-30 NOTE — Progress Notes (Signed)
ANTIBIOTIC CONSULT NOTE -follow up Pharmacy Consult for Vancomycin and Cefepime Indication: fevers  Allergies  Allergen Reactions  . Iohexol Swelling and Other (See Comments)    1970s; passed out and had facial/tongue swelling.  Requires 13-hour prep with prednisone and benadryl  . Ampicillin Hives and Rash  . Meperidine Hcl Swelling and Rash    Makes tongue swell  . Morphine Rash  . Penicillins Rash  . Valproic Acid And Related Other (See Comments)    Confusion   . Heparin     MDs told her not to take after reaction in ICU  . Pentazocine Lactate     Patient does not remember reaction to this med (Talwin).   . Phenytoin Other (See Comments)    Had reaction while in ICU; doesn't know.  MDs told her not to take ever again.  . Wellbutrin (Bupropion)     seizures  . Ace Inhibitors Rash  . Codeine Itching and Rash    Patient Measurements: Weight: 154 lb 1.6 oz (69.9 kg) Last weight 62.6kg (Feb 2014)  Vital Signs: Temp: 98.3 F (36.8 C) (06/29 0452) Temp src: Oral (06/29 0452) BP: 181/71 mmHg (06/29 1037) Pulse Rate: 63 (06/29 0452) Intake/Output from previous day: 06/28 0701 - 06/29 0700 In: 3 [I.V.:3] Out: -  Intake/Output from this shift: Total I/O In: 483 [P.O.:480; I.V.:3] Out: -   Labs:  Recent Labs  06/27/12 1712 06/28/12 0402 06/28/12 0408  WBC 6.8  --  8.1  HGB 12.1 11.6* 11.3*  PLT 162  --  147*  CREATININE 8.03* 9.60* 8.61*   The CrCl is unknown because both a height and weight (above a minimum accepted value) are required for this calculation. No results found for this basename: VANCOTROUGH, VANCOPEAK, VANCORANDOM, GENTTROUGH, GENTPEAK, GENTRANDOM, TOBRATROUGH, TOBRAPEAK, TOBRARND, AMIKACINPEAK, AMIKACINTROU, AMIKACIN,  in the last 72 hours   Microbiology: Recent Results (from the past 720 hour(s))  CULTURE, BLOOD (ROUTINE X 2)     Status: None   Collection Time    06/27/12  6:30 PM      Result Value Range Status   Specimen Description BLOOD  HAND RIGHT   Final   Special Requests BOTTLES DRAWN AEROBIC ONLY 4CC   Final   Culture  Setup Time 06/28/2012 03:17   Final   Culture     Final   Value:        BLOOD CULTURE RECEIVED NO GROWTH TO DATE CULTURE WILL BE HELD FOR 5 DAYS BEFORE ISSUING A FINAL NEGATIVE REPORT   Report Status PENDING   Incomplete  CULTURE, BLOOD (ROUTINE X 2)     Status: None   Collection Time    06/27/12  6:35 PM      Result Value Range Status   Specimen Description BLOOD ARM RIGHT   Final   Special Requests BOTTLES DRAWN AEROBIC ONLY 4CC   Final   Culture  Setup Time 06/28/2012 03:16   Final   Culture     Final   Value:        BLOOD CULTURE RECEIVED NO GROWTH TO DATE CULTURE WILL BE HELD FOR 5 DAYS BEFORE ISSUING A FINAL NEGATIVE REPORT   Report Status PENDING   Incomplete  CLOSTRIDIUM DIFFICILE BY PCR     Status: None   Collection Time    06/27/12  9:22 PM      Result Value Range Status   C difficile by pcr NEGATIVE  NEGATIVE Final  MRSA PCR SCREENING     Status:  None   Collection Time    06/28/12  2:12 AM      Result Value Range Status   MRSA by PCR NEGATIVE  NEGATIVE Final   Comment:            The GeneXpert MRSA Assay (FDA     approved for NASAL specimens     only), is one component of a     comprehensive MRSA colonization     surveillance program. It is not     intended to diagnose MRSA     infection nor to guide or     monitor treatment for     MRSA infections.     Assessment: 67 year old female on empiric broad-spectrum antimicrobial therapy with Vancomycin, Cefepime day # 3 for fevers and diarrhea.  She is HD-dependent on MWF schedule.  She is afebrile and last WBC was 8.1 on 6/27. BC no growth to date. Per MD note, plan is to dc abx if BC negative.  vanc 6/26>> Cefepime 7/27>> Flagyl 6/26-6/27  6/26 BC x 2 NGTD 6/26 cdiff negative   Goal of Therapy:  Pre-HD Vancomycin level 15-25 mcg/ml  Plan:  Cefepime 2gm IV after each HD session Vancomycin 750mg  IV after each HD  session Monitor for HD schedule and tolerance Follow available micro data Per MD plan is to DC abx if BC negative Herby Abraham, Pharm.D. [phone] 06/30/2012 11:24 AM

## 2012-06-30 NOTE — Evaluation (Signed)
Physical Therapy Evaluation Patient Details Name: [name] MRN: [id number] DOB: 17-Aug-1945 Today's Date: 06/30/2012 Time: 4098-1191 PT Time Calculation (min): 12 min  PT Assessment / Plan / Recommendation History of Present Illness  Patient is a 67 yo female admitted with AMS, HTN, fever, and diarrhea.  Patient with h/o CVA  Clinical Impression  Patient presents with decreased cognition, communication deficits, decreased strength, decreased balance impacting functional mobility.  Will benefit from acute PT to address these issues. Do not feel patient is safe to be home alone at this time.  Recommend SNF for continued therapy.    PT Assessment  Patient needs continued PT services    Follow Up Recommendations  SNF    Does the patient have the potential to tolerate intense rehabilitation      Barriers to Discharge Decreased caregiver support      Equipment Recommendations  None recommended by PT    Recommendations for Other Services     Frequency Min 3X/week    Precautions / Restrictions Precautions Precautions: Fall Restrictions Weight Bearing Restrictions: No   Pertinent Vitals/Pain       Mobility  Bed Mobility Bed Mobility: Supine to Sit;Sit to Supine Supine to Sit: 4: Min assist Sit to Supine: 5: Supervision Details for Bed Mobility Assistance: Verbal cues for technique.  Assist to move trunk up to sitting. Transfers Transfers: Sit to Stand;Stand to Sit Sit to Stand: 4: Min guard;From bed Stand to Sit: 4: Min guard;To bed Details for Transfer Assistance: Assist for balance/safety Ambulation/Gait Ambulation/Gait Assistance: 4: Min assist Ambulation Distance (Feet): 200 Feet Assistive device: 1 person hand held assist Ambulation/Gait Assistance Details: Patient with decreased balance with gait.  Patient lost balance x2 during gait, requiring assist to maintain upright position. Gait Pattern: Step-through pattern;Decreased stride length;Narrow base of support  (Staggering gait) Gait velocity: Slow gait speed    Exercises     PT Diagnosis: Difficulty walking;Abnormality of gait;Generalized weakness;Altered mental status  PT Problem List: Decreased strength;Decreased activity tolerance;Decreased balance;Decreased mobility;Decreased cognition;Decreased knowledge of use of DME;Decreased safety awareness PT Treatment Interventions: DME instruction;Gait training;Functional mobility training;Balance training;Cognitive remediation;Patient/family education     PT Goals(Current goals can be found in the care plan section) Acute Rehab PT Goals Patient Stated Goal: None stated PT Goal Formulation: With patient Time For Goal Achievement: 07/07/12 Potential to Achieve Goals: Good  Visit Information  Last PT Received On: 06/30/12 Assistance Needed: +1 History of Present Illness: Patient is a 67 yo female admitted with AMS, HTN, fever, and diarrhea.  Patient with h/o CVA       Prior Functioning  Home Living Family/patient expects to be discharged to:: Skilled nursing facility Home Equipment: Dan Humphreys - 2 wheels;Cane - single point Additional Comments: Patient not safe to be at home alone. Prior Function Level of Independence: Independent Communication Communication: Expressive difficulties (Answers "I'm fine" to most questions)    Cognition  Cognition Arousal/Alertness: Awake/alert Behavior During Therapy: Anxious Overall Cognitive Status: Impaired/Different from baseline Area of Impairment: Orientation;Following commands Orientation Level: Disoriented to;Time;Place;Situation Following Commands: Follows multi-step commands inconsistently General Comments: Difficult to assess cognition with communication difficulty    Extremity/Trunk Assessment Upper Extremity Assessment Upper Extremity Assessment: Overall WFL for tasks assessed Lower Extremity Assessment Lower Extremity Assessment: Generalized weakness   Balance Balance Balance Assessed:  Yes High Level Balance High Level Balance Activites: Turns;Sudden stops;Head turns High Level Balance Comments: Patient with loss of balance during high level balance activities.  End of Session PT - End of Session Equipment Utilized  During Treatment: Gait belt Activity Tolerance: Patient tolerated treatment well Patient left: in bed;with call bell/phone within reach Nurse Communication: Mobility status  GP     Vena Austria 06/30/2012, 5:06 PM  Durenda Hurt. Renaldo Fiddler, [facility] Acute Rehab Services Pager [phone]

## 2012-06-30 NOTE — Progress Notes (Signed)
[facility] PROGRESS NOTE  [name] ZOX:[id number] DOB: 1945-10-31 DOA: 06/27/2012 PCP: [name], MD  Assessment/Plan: 1. Increasing mental status changes;  changes over last 36 hours, we'll obtain CMP, ammonia level,magnesium level, phosphorus,TSH, free T4, head MRI/MRA 2. HTN;increase hydralazine to 100 mg 3 times a day   Code Status: Full Family Communication: None Disposition Plan: evaluate increasing mental status change   Procedures: Head CT; Atrophy and extensive chronic ischemia. No acute abnormality.  Antibiotics:  Cefepime grams .M/W/F w/ HD  HPI/Subjective:   Objective: Filed Vitals:   06/29/12 2100 06/30/12 0452 06/30/12 1037 06/30/12 1442  BP: 148/62 153/62 181/71 176/63  Pulse: 61 63  68  Temp: 98.5 F (36.9 C) 98.3 F (36.8 C)  97.8 F (36.6 C)  TempSrc: Oral Oral  Oral  Resp: 18 18  20   Weight:  69.9 kg (154 lb 1.6 oz)    SpO2: 100% 100%  96%    Intake/Output Summary (Last 24 hours) at 06/30/12 1726 Last data filed at 06/30/12 1442  Gross per 24 hour  Intake    723 ml  Output      0 ml  Net    723 ml   Filed Weights   06/28/12 0857 06/29/12 0500 06/30/12 0452  Weight: 66.1 kg (145 lb 11.6 oz) 69.582 kg (153 lb 6.4 oz) 69.9 kg (154 lb 1.6 oz)    Exam:   General:  Awake, NAD  Cardiovascular: regular rhythm and rate, negative murmur rubs or gallops, DP/PT pulses 2+  Respiratory:clear patient bilaterally  Abdomen: soft, nontender, nondistended, plus bowel sounds Musculoskeletal: negative pedal edema Neurologic exam; alert and oriented x1 (to person),  Data Reviewed: Basic Metabolic Panel:  Recent Labs Lab 06/27/12 1712 06/27/12 2112 06/28/12 0051 06/28/12 0402 06/28/12 0408  NA 135  --   --  140 140  K 6.2* 6.8* 6.2* 4.9 4.9  CL 94*  --   --  106 101  CO2 25  --   --   --  24  GLUCOSE 77  --   --  79 77  BUN 36*  --   --  34* 37*  CREATININE 8.03*  --   --  9.60* 8.61*  CALCIUM 9.2  --   --   --  9.0    Liver Function Tests:  Recent Labs Lab 06/27/12 1712  AST 15  ALT 10  ALKPHOS 89  BILITOT 0.3  PROT 6.8  ALBUMIN 3.5   No results found for this basename: LIPASE, AMYLASE,  in the last 168 hours No results found for this basename: AMMONIA,  in the last 168 hours CBC:  Recent Labs Lab 06/27/12 1712 06/28/12 0402 06/28/12 0408  WBC 6.8  --  8.1  HGB 12.1 11.6* 11.3*  HCT 36.4 34.0* 33.8*  MCV 83.3  --  83.7  PLT 162  --  147*   Cardiac Enzymes: No results found for this basename: CKTOTAL, CKMB, CKMBINDEX, TROPONINI,  in the last 168 hours BNP (last 3 results) No results found for this basename: PROBNP,  in the last 8760 hours CBG:  Recent Labs Lab 06/29/12 1647 06/29/12 2154 06/30/12 0748 06/30/12 1109 06/30/12 1652  GLUCAP 137* 93 76 76 81    Recent Results (from the past 240 hour(s))  CULTURE, BLOOD (ROUTINE X 2)     Status: None   Collection Time    06/27/12  6:30 PM      Result Value Range Status   Specimen Description BLOOD HAND  RIGHT   Final   Special Requests BOTTLES DRAWN AEROBIC ONLY 4CC   Final   Culture  Setup Time 06/28/2012 03:17   Final   Culture     Final   Value:        BLOOD CULTURE RECEIVED NO GROWTH TO DATE CULTURE WILL BE HELD FOR 5 DAYS BEFORE ISSUING A FINAL NEGATIVE REPORT   Report Status PENDING   Incomplete  CULTURE, BLOOD (ROUTINE X 2)     Status: None   Collection Time    06/27/12  6:35 PM      Result Value Range Status   Specimen Description BLOOD ARM RIGHT   Final   Special Requests BOTTLES DRAWN AEROBIC ONLY 4CC   Final   Culture  Setup Time 06/28/2012 03:16   Final   Culture     Final   Value:        BLOOD CULTURE RECEIVED NO GROWTH TO DATE CULTURE WILL BE HELD FOR 5 DAYS BEFORE ISSUING A FINAL NEGATIVE REPORT   Report Status PENDING   Incomplete  CLOSTRIDIUM DIFFICILE BY PCR     Status: None   Collection Time    06/27/12  9:22 PM      Result Value Range Status   C difficile by pcr NEGATIVE  NEGATIVE Final  MRSA PCR  SCREENING     Status: None   Collection Time    06/28/12  2:12 AM      Result Value Range Status   MRSA by PCR NEGATIVE  NEGATIVE Final   Comment:            The GeneXpert MRSA Assay (FDA     approved for NASAL specimens     only), is one component of a     comprehensive MRSA colonization     surveillance program. It is not     intended to diagnose MRSA     infection nor to guide or     monitor treatment for     MRSA infections.     Studies: No results found.  Scheduled Meds: . amLODipine  10 mg Oral Daily  . aspirin EC  81 mg Oral Daily  . carvedilol  25 mg Oral BID WC  . ceFEPime (MAXIPIME) IV  2 g Intravenous Q M,W,F-HD  . cinacalcet  30 mg Oral QHS  . darbepoetin (ARANESP) injection - DIALYSIS  40 mcg Intravenous Q Fri-HD  . doxercalciferol  4 mcg Intravenous Q M,W,F-HD  . hydrALAZINE  50 mg Oral TID AC & HS  . levETIRAcetam  500 mg Oral Q2000  . levothyroxine  88 mcg Oral QAC breakfast  . mometasone-formoterol  2 puff Inhalation BID  . multivitamin  1 tablet Oral QHS  . sevelamer carbonate  1,600 mg Oral Q1200  . sevelamer carbonate  3,200 mg Oral BID WC  . simvastatin  20 mg Oral q1800  . sodium chloride  3 mL Intravenous Q12H  . vancomycin  750 mg Intravenous Q M,W,F-HD   Continuous Infusions:   Principal Problem:   Encephalopathy acute Active Problems:   Essential hypertension, malignant   ESRD   Hyperkalemia, diminished renal excretion    Time spent: 45 minutes   [name], J  [facility] Pager [phone]. If 7PM-7AM, please contact night-coverage at [email or web address], password [facility] 06/30/2012, 5:26 PM  LOS: 3 days

## 2012-06-30 NOTE — Progress Notes (Signed)
S: No specific co but keeps saying "I need to take care of myself" O:BP 153/62  Pulse 63  Temp(Src) 98.3 F (36.8 C) (Oral)  Resp 18  Wt 69.9 kg (154 lb 1.6 oz)  BMI 27.72 kg/m2  SpO2 100%  Intake/Output Summary (Last 24 hours) at 06/30/12 0729 Last data filed at 06/29/12 1100  Gross per 24 hour  Intake      3 ml  Output      0 ml  Net      3 ml   Weight change: 3.8 kg (8 lb 6 oz) VWU:JWJXB and alert CVS:RRR Resp:clear Abd:+ BS NTND Ext:no edema Lt Hero graft + bruit NEURO:Ox1. CNI moves all extremities, CNI, follows commands   . amLODipine  10 mg Oral Daily  . aspirin EC  81 mg Oral Daily  . carvedilol  25 mg Oral BID WC  . ceFEPime (MAXIPIME) IV  2 g Intravenous Q M,W,F-HD  . cinacalcet  30 mg Oral QHS  . darbepoetin (ARANESP) injection - DIALYSIS  40 mcg Intravenous Q Fri-HD  . doxercalciferol  4 mcg Intravenous Q M,W,F-HD  . hydrALAZINE  50 mg Oral TID AC & HS  . levETIRAcetam  500 mg Oral Q2000  . levothyroxine  88 mcg Oral QAC breakfast  . mometasone-formoterol  2 puff Inhalation BID  . multivitamin  1 tablet Oral QHS  . sevelamer carbonate  1,600 mg Oral Q1200  . sevelamer carbonate  3,200 mg Oral BID WC  . simvastatin  20 mg Oral q1800  . sodium chloride  3 mL Intravenous Q12H  . vancomycin  750 mg Intravenous Q M,W,F-HD   No results found. BMET    Component Value Date/Time   NA 140 06/28/2012 0408   K 4.9 06/28/2012 0408   CL 101 06/28/2012 0408   CO2 24 06/28/2012 0408   GLUCOSE 77 06/28/2012 0408   BUN 37* 06/28/2012 0408   CREATININE 8.61* 06/28/2012 0408   CALCIUM 9.0 06/28/2012 0408   GFRNONAA 4* 06/28/2012 0408   GFRAA 5* 06/28/2012 0408   CBC    Component Value Date/Time   WBC 8.1 06/28/2012 0408   RBC 4.04 06/28/2012 0408   HGB 11.3* 06/28/2012 0408   HCT 33.8* 06/28/2012 0408   PLT 147* 06/28/2012 0408   MCV 83.7 06/28/2012 0408   MCH 28.0 06/28/2012 0408   MCHC 33.4 06/28/2012 0408   RDW 15.0 06/28/2012 0408   LYMPHSABS 1.4 01/20/2012 1546   MONOABS 0.4 01/20/2012 1546   EOSABS 0.2 01/20/2012 1546   BASOSABS 0.0 01/20/2012 1546     Assessment: 1. AMS, improved, but seems confused today 2. Diarrhea,C diff -, improved 3. Sec HPTH on hectorol 4. Anemia on aranesp 5. HTN 6. DM 7. ESRD 4 hr bfr 350 2k/2ca  16g needles  dw 65.5 Plan: 1. HD in AM  2. Follow mental status Nataleigh Griffin T

## 2012-07-01 ENCOUNTER — Encounter (HOSPITAL_COMMUNITY): Payer: Self-pay | Admitting: General Practice

## 2012-07-01 ENCOUNTER — Inpatient Hospital Stay (HOSPITAL_COMMUNITY): Payer: Medicare Other

## 2012-07-01 DIAGNOSIS — R4182 Altered mental status, unspecified: Secondary | ICD-10-CM | POA: Diagnosis not present

## 2012-07-01 DIAGNOSIS — E875 Hyperkalemia: Secondary | ICD-10-CM | POA: Diagnosis not present

## 2012-07-01 DIAGNOSIS — N039 Chronic nephritic syndrome with unspecified morphologic changes: Secondary | ICD-10-CM | POA: Diagnosis not present

## 2012-07-01 DIAGNOSIS — I6350 Cerebral infarction due to unspecified occlusion or stenosis of unspecified cerebral artery: Secondary | ICD-10-CM | POA: Diagnosis not present

## 2012-07-01 DIAGNOSIS — N186 End stage renal disease: Secondary | ICD-10-CM | POA: Diagnosis not present

## 2012-07-01 DIAGNOSIS — I120 Hypertensive chronic kidney disease with stage 5 chronic kidney disease or end stage renal disease: Secondary | ICD-10-CM | POA: Diagnosis not present

## 2012-07-01 LAB — GLUCOSE, CAPILLARY
Glucose-Capillary: 75 mg/dL (ref 70–99)
Glucose-Capillary: 60 mg/dL — ABNORMAL LOW (ref 70–99)

## 2012-07-01 LAB — TSH
TSH: 1.861 u[IU]/mL (ref 0.350–4.500)
TSH: 2.198 u[IU]/mL (ref 0.350–4.500)

## 2012-07-01 LAB — AMMONIA: Ammonia: 31 umol/L (ref 11–60)

## 2012-07-01 MED ORDER — MIDAZOLAM HCL 2 MG/2ML IJ SOLN
INTRAMUSCULAR | Status: AC | PRN
  Administered 2012-07-01: 1 mg via INTRAVENOUS
  Administered 2012-07-01 (×2): 2 mg via INTRAVENOUS
  Administered 2012-07-01 (×3): 1 mg via INTRAVENOUS

## 2012-07-01 MED ORDER — FENTANYL CITRATE 0.05 MG/ML IJ SOLN
INTRAMUSCULAR | Status: AC | PRN
  Administered 2012-07-01 (×2): 50 ug via INTRAVENOUS
  Administered 2012-07-01: 100 ug via INTRAVENOUS

## 2012-07-01 MED ORDER — DIPHENHYDRAMINE HCL 12.5 MG/5ML PO ELIX
6.2500 mg | ORAL_SOLUTION | Freq: Once | ORAL | Status: DC
  Filled 2012-07-01: qty 5

## 2012-07-01 MED ORDER — MIDAZOLAM HCL 2 MG/2ML IJ SOLN: 2.0000 mg | INTRAMUSCULAR | Status: DC | PRN

## 2012-07-01 MED ORDER — MIDAZOLAM HCL 2 MG/2ML IJ SOLN: 2.0000 mg | Freq: Once | INTRAMUSCULAR | Status: DC

## 2012-07-01 MED ORDER — MIDAZOLAM HCL 2 MG/2ML IJ SOLN
1.0000 mg | INTRAMUSCULAR | Status: DC | PRN
  Filled 2012-07-01: qty 10

## 2012-07-01 NOTE — Progress Notes (Signed)
Clinical Social Work Department BRIEF PSYCHOSOCIAL ASSESSMENT 07/01/2012  Patient:  [name],[name]     Account Number:  [id number]     Admit date:  06/27/2012  Clinical Social Worker:  Dennison Bulla  Date/Time:  07/01/2012 10:15 AM  Referred by:  Physician  Date Referred:  07/01/2012 Referred for  SNF Placement   Other Referral:   Interview type:  Patient Other interview type:    PSYCHOSOCIAL DATA Living Status:  ALONE Admitted from facility:   Level of care:   Primary support name:  [name] Primary support relationship to patient:  CHILD, ADULT Degree of support available:   Strong    CURRENT CONCERNS Current Concerns  Post-Acute Placement   Other Concerns:    SOCIAL WORK ASSESSMENT / PLAN CSW received referral to assist with DC planning. CSW reviewed chart which stated that PT/OT recommended SNF. CSW met with patient at bedside.    CSW introduced myself and explained role. Patient remains confused and is unable to tell CSW where she is or the year. Patient reports she does not feel well and struggles with participating in assessment. Patient gave CSW permission to contact dtrs in regards to her care.    CSW spoke with dtr ([name]) via phone who reports that patient currently lives alone and is agreeable to SNF placement. Patient stayed at [facility] last year and dtr requests another placement. CSW left SNF list in room for dtr to review once she comes to visit patient. CSW explained Medicare benefits for SNF. Dtr gave CSW permission to fax out to [facility].    CSW completed FL2 and faxed out. CSW verified with CM that patient was meeting inpatient status of 06/27/12. CSW will follow up with bed offers.   Assessment/plan status:  Psychosocial Support/Ongoing Assessment of Needs Other assessment/ plan:   Information/referral to community resources:   SNF information    PATIENT'S/FAMILY'S RESPONSE TO PLAN OF CARE: Patient remains confused and unable to fully participate  in assessment. Dtr thanked CSW for call and is agreeable to SNF at DC.       (COVERAGE)

## 2012-07-01 NOTE — Progress Notes (Signed)
[facility] PROGRESS NOTE  [name] YNW:[id number] DOB: 08/26/1945 DOA: 06/27/2012 PCP: [name], MD  Assessment/Plan: 1. Increasing mental status changes; Some improvement overnight but still having some difficulty finding words, and expressing thoughts. No electrolyte abnormalities to explain problem brain MRI pending, thyroid panel pending, ammonia level normal 2. HTN;improved with increased hydralazine however still high patient scheduled for HD today Will hold changes to medication until after HD.  3. ESRD; receives HD M/W/F. Seen by Dr. Shirts(Nephrologist) Who has arranged for Pt's HD today  Code Status: Full Family Communication: None Disposition Plan: evaluate increasing mental status change   Procedures: Head CT; Atrophy and extensive chronic ischemia. No acute abnormality.  Antibiotics:  Cefepime grams .M/W/F w/ HD  HPI/Subjective:   Objective: Filed Vitals:   06/30/12 1037 06/30/12 1442 06/30/12 2038 07/01/12 0500  BP: 181/71 176/63 159/62 168/73  Pulse:  68 61 64  Temp:  97.8 F (36.6 C) 98.3 F (36.8 C) 97.8 F (36.6 C)  TempSrc:  Oral Oral   Resp:  20 22 20   Weight:      SpO2:  96% 100% 100%    Intake/Output Summary (Last 24 hours) at 07/01/12 0721 Last data filed at 06/30/12 1442  Gross per 24 hour  Intake    723 ml  Output      0 ml  Net    723 ml   Filed Weights   06/28/12 0857 06/29/12 0500 06/30/12 0452  Weight: 66.1 kg (145 lb 11.6 oz) 69.582 kg (153 lb 6.4 oz) 69.9 kg (154 lb 1.6 oz)    Exam:   General:  Awake, NAD  Cardiovascular: regular rhythm and rate, negative murmur rubs or gallops, DP/PT pulses 2+  Respiratory:clear patient bilaterally  Abdomen: soft, nontender, nondistended, plus bowel sounds Musculoskeletal: negative pedal edema Neurologic exam; alert and oriented x4, obeys all commands, still having some trouble finding certain words and expressing her needs. Cranial nerves II through XII are intact all  extremities strength 5/5, sensation intact    Data Reviewed: Basic Metabolic Panel:  Recent Labs Lab 06/27/12 1712 06/27/12 2112 06/28/12 0051 06/28/12 0402 06/28/12 0408 06/30/12 1901  NA 135  --   --  140 140 135  K 6.2* 6.8* 6.2* 4.9 4.9 4.4  CL 94*  --   --  106 101 96  CO2 25  --   --   --  24 23  GLUCOSE 77  --   --  79 77 82  BUN 36*  --   --  34* 37* 33*  CREATININE 8.03*  --   --  9.60* 8.61* 10.43*  CALCIUM 9.2  --   --   --  9.0 9.2  MG  --   --   --   --   --  1.9  PHOS  --   --   --   --   --  5.1*   Liver Function Tests:  Recent Labs Lab 06/27/12 1712 06/30/12 1901  AST 15 15  ALT 10 8  ALKPHOS 89 74  BILITOT 0.3 0.3  PROT 6.8 5.8*  ALBUMIN 3.5 2.8*   No results found for this basename: LIPASE, AMYLASE,  in the last 168 hours  Recent Labs Lab 06/30/12 1901  AMMONIA 31   CBC:  Recent Labs Lab 06/27/12 1712 06/28/12 0402 06/28/12 0408 06/30/12 1901  WBC 6.8  --  8.1 4.9  NEUTROABS  --   --   --  2.8  HGB 12.1 11.6*  11.3* 10.7*  HCT 36.4 34.0* 33.8* 32.7*  MCV 83.3  --  83.7 83.8  PLT 162  --  147* 125*   Cardiac Enzymes: No results found for this basename: CKTOTAL, CKMB, CKMBINDEX, TROPONINI,  in the last 168 hours BNP (last 3 results) No results found for this basename: PROBNP,  in the last 8760 hours CBG:  Recent Labs Lab 06/29/12 2154 06/30/12 0748 06/30/12 1109 06/30/12 1652 06/30/12 2039  GLUCAP 93 76 76 81 96    Recent Results (from the past 240 hour(s))  CULTURE, BLOOD (ROUTINE X 2)     Status: None   Collection Time    06/27/12  6:30 PM      Result Value Range Status   Specimen Description BLOOD HAND RIGHT   Final   Special Requests BOTTLES DRAWN AEROBIC ONLY 4CC   Final   Culture  Setup Time 06/28/2012 03:17   Final   Culture     Final   Value:        BLOOD CULTURE RECEIVED NO GROWTH TO DATE CULTURE WILL BE HELD FOR 5 DAYS BEFORE ISSUING A FINAL NEGATIVE REPORT   Report Status PENDING   Incomplete  CULTURE,  BLOOD (ROUTINE X 2)     Status: None   Collection Time    06/27/12  6:35 PM      Result Value Range Status   Specimen Description BLOOD ARM RIGHT   Final   Special Requests BOTTLES DRAWN AEROBIC ONLY 4CC   Final   Culture  Setup Time 06/28/2012 03:16   Final   Culture     Final   Value:        BLOOD CULTURE RECEIVED NO GROWTH TO DATE CULTURE WILL BE HELD FOR 5 DAYS BEFORE ISSUING A FINAL NEGATIVE REPORT   Report Status PENDING   Incomplete  CLOSTRIDIUM DIFFICILE BY PCR     Status: None   Collection Time    06/27/12  9:22 PM      Result Value Range Status   C difficile by pcr NEGATIVE  NEGATIVE Final  MRSA PCR SCREENING     Status: None   Collection Time    06/28/12  2:12 AM      Result Value Range Status   MRSA by PCR NEGATIVE  NEGATIVE Final   Comment:            The GeneXpert MRSA Assay (FDA     approved for NASAL specimens     only), is one component of a     comprehensive MRSA colonization     surveillance program. It is not     intended to diagnose MRSA     infection nor to guide or     monitor treatment for     MRSA infections.     Studies: No results found.  Scheduled Meds: . amLODipine  10 mg Oral Daily  . aspirin EC  81 mg Oral Daily  . carvedilol  25 mg Oral BID WC  . ceFEPime (MAXIPIME) IV  2 g Intravenous Q M,W,F-HD  . cinacalcet  30 mg Oral QHS  . darbepoetin (ARANESP) injection - DIALYSIS  40 mcg Intravenous Q Fri-HD  . doxercalciferol  4 mcg Intravenous Q M,W,F-HD  . hydrALAZINE  50 mg Oral TID AC & HS  . levETIRAcetam  500 mg Oral Q2000  . levothyroxine  88 mcg Oral QAC breakfast  . midazolam  2 mg Intramuscular Once  . mometasone-formoterol  2 puff Inhalation BID  .  multivitamin  1 tablet Oral QHS  . sevelamer carbonate  1,600 mg Oral Q1200  . sevelamer carbonate  3,200 mg Oral BID WC  . simvastatin  20 mg Oral q1800  . sodium chloride  3 mL Intravenous Q12H   Continuous Infusions:   Principal Problem:   Encephalopathy acute Active  Problems:   Essential hypertension, malignant   ESRD   Hyperkalemia, diminished renal excretion    Time spent: 45 minutes   Larine Fielding, J  [facility] Pager [phone]. If 7PM-7AM, please contact night-coverage at [email or web address], password [facility] 07/01/2012, 7:21 AM  LOS: 4 days

## 2012-07-01 NOTE — Evaluation (Signed)
Occupational Therapy Evaluation Patient Details Name: [name] MRN: [id number] DOB: 06-11-1945 Today's Date: 07/01/2012 Time: 7829-5621 OT Time Calculation (min): 29 min  OT Assessment / Plan / Recommendation History of present illness Patient is a 67 yo female admitted with AMS, HTN, fever, and diarrhea.  Patient with h/o CVA   Clinical Impression   Pt overall performing at a supervision level in mobility and ADL.  Pt with complaints of vaginal burning and rather perseverative.  Pt disoriented to place, time, and situation.  Pt is not safe for d/c home alone.  Recommend SNF.  Will defer OT to SNF.    OT Assessment  All further OT needs can be met in the next venue of care    Follow Up Recommendations  SNF    Barriers to Discharge      Equipment Recommendations  None recommended by OT    Recommendations for Other Services    Frequency       Precautions / Restrictions Precautions Precautions: Fall Restrictions Weight Bearing Restrictions: No   Pertinent Vitals/Pain Vaginal area, cleaned, barrier cream applied, ice attempted, nursing aware    ADL  Eating/Feeding: Independent Where Assessed - Eating/Feeding: Edge of bed Grooming: Wash/dry hands;Wash/dry face;Supervision/safety Where Assessed - Grooming: Unsupported standing Upper Body Bathing: Supervision/safety Where Assessed - Upper Body Bathing: Unsupported sitting Lower Body Bathing: Supervision/safety Where Assessed - Lower Body Bathing: Unsupported sitting;Supported sit to stand Upper Body Dressing: Supervision/safety Where Assessed - Upper Body Dressing: Unsupported sitting Lower Body Dressing: Supervision/safety Where Assessed - Lower Body Dressing: Unsupported sitting;Supported sit to stand Toilet Transfer: Supervision/safety Toilet Transfer Method: Sit to Barista: Regular height toilet;Grab bars Toileting - Clothing Manipulation and Hygiene: Supervision/safety Where Assessed -  Engineer, mining and Hygiene: Sit to stand from 3-in-1 or toilet Transfers/Ambulation Related to ADLs: supervision within room ADL Comments: sat on shower seat in front of sink for bathing and dressing, perseverates on vagina burning, nursing is aware, barrier cream applied, offered ice pack    OT Diagnosis: Cognitive deficits;Generalized weakness;Acute pain  OT Problem List: Impaired balance (sitting and/or standing);Decreased cognition;Decreased safety awareness;Pain OT Treatment Interventions:     OT Goals(Current goals can be found in the care plan section) Acute Rehab OT Goals Patient Stated Goal: Go home.  Visit Information  Last OT Received On: 07/01/12 Assistance Needed: +1 History of Present Illness: Patient is a 67 yo female admitted with AMS, HTN, fever, and diarrhea.  Patient with h/o CVA       Prior Functioning     Home Living Family/patient expects to be discharged to:: Skilled nursing facility Home Equipment: Dan Humphreys - 2 wheels;Cane - single point Additional Comments: Patient not safe to be at home alone. Prior Function Level of Independence: Independent Communication Communication: Expressive difficulties (repeats "it's terrible") Dominant Hand: Right         Vision/Perception Vision - History Baseline Vision: Wears glasses all the time Patient Visual Report: No change from baseline   Cognition  Cognition Arousal/Alertness: Awake/alert Behavior During Therapy: Anxious Overall Cognitive Status: Impaired/Different from baseline Area of Impairment: Orientation;Following commands Orientation Level: Disoriented to;Time;Place;Situation Following Commands: Follows multi-step commands inconsistently General Comments: perseverative    Extremity/Trunk Assessment Upper Extremity Assessment Upper Extremity Assessment: Overall WFL for tasks assessed     Mobility Bed Mobility Bed Mobility: Supine to Sit;Sit to Supine Supine to Sit: 5:  Supervision Sit to Supine: 5: Supervision Transfers Transfers: Sit to Stand;Stand to Sit Sit to Stand: 5: Supervision;From bed;From chair/3-in-1;From toilet  Stand to Sit: 5: Supervision;To bed;To chair/3-in-1;To toilet     Exercise     Balance Balance Balance Assessed: Yes Dynamic Sitting Balance Dynamic Sitting - Balance Support: Left upper extremity supported Dynamic Sitting - Level of Assistance: 5: Stand by assistance Dynamic Sitting - Comments: 5   End of Session OT - End of Session Activity Tolerance: Patient tolerated treatment well Patient left: in bed;with call bell/phone within reach  GO     Evern Bio 07/01/2012, 9:32 AM [phone]

## 2012-07-01 NOTE — Progress Notes (Signed)
S: No specific co but keeps saying "I need to take care of myself" O:BP 168/73  Pulse 64  Temp(Src) 97.8 F (36.6 C) (Oral)  Resp 20  Wt 69.9 kg (154 lb 1.6 oz)  BMI 27.72 kg/m2  SpO2 100%  Intake/Output Summary (Last 24 hours) at 07/01/12 1048 Last data filed at 07/01/12 0915  Gross per 24 hour  Intake    483 ml  Output      0 ml  Net    483 ml   Weight change:    . amLODipine  10 mg Oral Daily  . aspirin EC  81 mg Oral Daily  . carvedilol  25 mg Oral BID WC  . ceFEPime (MAXIPIME) IV  2 g Intravenous Q M,W,F-HD  . cinacalcet  30 mg Oral QHS  . darbepoetin (ARANESP) injection - DIALYSIS  40 mcg Intravenous Q Fri-HD  . doxercalciferol  4 mcg Intravenous Q M,W,F-HD  . hydrALAZINE  50 mg Oral TID AC & HS  . levETIRAcetam  500 mg Oral Q2000  . levothyroxine  88 mcg Oral QAC breakfast  . midazolam  2 mg Intramuscular Once  . mometasone-formoterol  2 puff Inhalation BID  . multivitamin  1 tablet Oral QHS  . sevelamer carbonate  1,600 mg Oral Q1200  . sevelamer carbonate  3,200 mg Oral BID WC  . simvastatin  20 mg Oral q1800  . sodium chloride  3 mL Intravenous Q12H   No results found. BMET    Component Value Date/Time   NA 135 06/30/2012 1901   K 4.4 06/30/2012 1901   CL 96 06/30/2012 1901   CO2 23 06/30/2012 1901   GLUCOSE 82 06/30/2012 1901   BUN 33* 06/30/2012 1901   CREATININE 10.43* 06/30/2012 1901   CALCIUM 9.2 06/30/2012 1901   GFRNONAA 3* 06/30/2012 1901   GFRAA 4* 06/30/2012 1901   CBC    Component Value Date/Time   WBC 4.9 06/30/2012 1901   RBC 3.90 06/30/2012 1901   HGB 10.7* 06/30/2012 1901   HCT 32.7* 06/30/2012 1901   PLT 125* 06/30/2012 1901   MCV 83.8 06/30/2012 1901   MCH 27.4 06/30/2012 1901   MCHC 32.7 06/30/2012 1901   RDW 14.9 06/30/2012 1901   LYMPHSABS 1.2 06/30/2012 1901   MONOABS 0.6 06/30/2012 1901   EOSABS 0.2 06/30/2012 1901   BASOSABS 0.0 06/30/2012 1901   RUE:AVWUJ and alert CVS:RRR Resp:clear Abd:+ BS NTND Ext:no edema Lt Hero graft +  bruit NEURO:Ox1. CNI moves all extremities, CNI, follows commands  [facility]: [facility]   65.5kg EDW  BFR350  2K/2.5Ca Bath   4hrs  LUA AVG  16 Ga  Hect 6ug  Venofer 100mg /wk  Heparin none  EPO none   Assessment/Plan: 1. Fever/AMS/diarrhea- improved, Cdif and BCx's neg, per primary 2. Confusion- better, oriented 3. Sec HPTH on hectorol 4. Anemia on aranesp 5. HTN 6. DM 7. ESRD, cont MWF HD. HD today 8. Dispo- lives alone, DTR lives nearby, has been ambulating in room. Per primary  Vinson Moselle  MD Pager [phone]    Cell  [phone] 07/01/2012, 10:52 AM

## 2012-07-01 NOTE — ED Notes (Signed)
Pt complains of pain to left arm at HD graft site.  No noticeable redness, swelling or bruising noted.  Elevated arm on pillow for pt, informed primary 3W nurse Maureen Ralphs, RN of this.  Pt says arm feeling better after being elevated

## 2012-07-01 NOTE — Progress Notes (Addendum)
Clinical Social Work Department CLINICAL SOCIAL WORK PLACEMENT NOTE 07/01/2012  Patient:  [name], [name]  Account Number:  [id number] Admit date:  06/27/2012  Clinical Social Worker:  Unk Lightning, LCSW  Date/time:  07/01/2012 10:15 AM  Clinical Social Work is seeking post-discharge placement for this patient at the following level of care:   SKILLED NURSING   (*CSW will update this form in Epic as items are completed)   07/01/2012  Patient/family provided with [facility] Department of Clinical Social Work's list of facilities offering this level of care within the geographic area requested by the patient (or if unable, by the patient's family).  07/01/2012  Patient/family informed of their freedom to choose among providers that offer the needed level of care, that participate in Medicare, Medicaid or managed care program needed by the patient, have an available bed and are willing to accept the patient.  07/01/2012  Patient/family informed of [facility]' ownership interest in [facility], as well as of the fact that they are under no obligation to receive care at this facility.  PASARR submitted to EDS on existing # PASARR number received from E[employer] on   FL2 transmitted to all facilities in geographic area requested by pt/family on  07/01/2012 FL2 transmitted to all facilities within larger geographic area on   Patient informed that his/her managed care company has contracts with or will negotiate with  certain facilities, including the following:     Patient/family informed of bed offers received:  07-02-12 Patient chooses bed at [facility] Physician recommends and patient chooses bed at    Patient to be transferred to [facility] on  07-02-12 Patient to be transferred to facility by Private vehicle of family  The following physician request were entered in Epic:   Additional Comments:

## 2012-07-01 NOTE — H&P (Signed)
[name] is an 67 y.o. female.   Chief Complaint: altered mental status Alert/oriented now Tearful Scheduled for MRI/MRA with sedation HPI: CAD;CVA; HLD; CHF; COPD; HTN; ESRD  Past Medical History  Diagnosis Date  . CAD (coronary artery disease)     stent to RCA  . CVA (cerebral infarction) 2003    no apparent residual  . Hypothyroidism   . PVD (peripheral vascular disease)   . Hyperlipidemia   . Positive PPD     completed rifampin  . Diastolic congestive heart failure   . Hypertension   . COPD (chronic obstructive pulmonary disease) t  . Cancer     clear cell cancer, kidney  . Complication of anesthesia 12/2010    pt is very confused, with AMS with anesthesia  . CVA 07/27/2008    CVA affected cognition and memory per family, no focal deficits.    . ESRD 07/27/2008    ESRD due to HTN and NSAID's, started hemodialysis in 2005 in [place], [place]. Went to [facility] from 2010 to 2012 and since 2012 has been getting dialysis at [facility] on [address] in [place] on a MWF schedule. First access with RUA AVG placed in [place]. Next and current access was LUA AVG placed by Dr. Wyn Quaker in [place] in or around 2012. Has had 2 or 3 procedures on that graft since placed per family. She gets her access work done here in [place] now. She is allergic to heparin and does not get any heparin at dialysis; she had an allergic reaction apparently when in ICU in the past.      . Encephalopathy     Past Surgical History  Procedure Laterality Date  . Abdominal hysterectomy    . Cholecystectomy    . D&cs    . Right ankle repair    . Left nephrectomy    . Vascular surgery  11/2010    graft inserted to left arm    Family History  Problem Relation Age of Onset  . Heart disease Father   . Hypertension Mother   . Dementia Mother   . Coronary artery disease Sister    Social History:  reports that she has been smoking Cigarettes.  She has a 106 pack-year smoking history. She has  never used smokeless tobacco. She reports that she does not drink alcohol or use illicit drugs.  Allergies:  Allergies  Allergen Reactions  . Iohexol Swelling and Other (See Comments)    1970s; passed out and had facial/tongue swelling.  Requires 13-hour prep with prednisone and benadryl  . Ampicillin Hives and Rash  . Meperidine Hcl Swelling and Rash    Makes tongue swell  . Morphine Rash  . Penicillins Rash  . Valproic Acid And Related Other (See Comments)    Confusion   . Heparin     MDs told her not to take after reaction in ICU  . Pentazocine Lactate     Patient does not remember reaction to this med (Talwin).   . Phenytoin Other (See Comments)    Had reaction while in ICU; doesn't know.  MDs told her not to take ever again.  . Wellbutrin (Bupropion)     seizures  . Ace Inhibitors Rash  . Codeine Itching and Rash    Medications Prior to Admission  Medication Sig Dispense Refill  . acetaminophen (TYLENOL) 500 MG tablet Take 500 mg by mouth every 6 (six) hours as needed for pain.      [name] albuterol-ipratropium (COMBIVENT)  18-103 MCG/ACT inhaler Inhale 2 puffs into the lungs every 6 (six) hours as needed. For shortness of breath      . amLODipine (NORVASC) 10 MG tablet Take 10 mg by mouth daily.       [name] aspirin EC 81 MG tablet Take 81 mg by mouth daily.      . carvedilol (COREG) 25 MG tablet Take 25 mg by mouth 2 (two) times daily with a meal.       . cinacalcet (SENSIPAR) 30 MG tablet Take 30 mg by mouth at bedtime.      . Fluticasone-Salmeterol (ADVAIR) 100-50 MCG/DOSE AEPB Inhale 1 puff into the lungs every 12 (twelve) hours.      . hydrALAZINE (APRESOLINE) 50 MG tablet Take 1 tablet (50 mg total) by mouth 4 (four) times daily.  120 tablet  1  . levETIRAcetam (KEPPRA) 500 MG tablet Take 500 mg by mouth daily at 8 pm.      . levothyroxine (SYNTHROID, LEVOTHROID) 88 MCG tablet Take 88 mcg by mouth daily.       [name] loperamide (IMODIUM) 2 MG capsule Take 2 mg by mouth 4 (four)  times daily as needed for diarrhea or loose stools.      [name] oxyCODONE (OXYCONTIN) 10 MG 12 hr tablet Take 10 mg by mouth every 12 (twelve) hours.      . pravastatin (PRAVACHOL) 40 MG tablet Take 40 mg by mouth at bedtime.       . sevelamer (RENAGEL) 800 MG tablet Take 1,600-2,800 mg by mouth 3 (three) times daily with meals. Take 4 tablets in the morning, two tablets at noon, and 4 tablets at night      . simethicone (MYLICON) 125 MG chewable tablet Chew 125 mg by mouth every 6 (six) hours as needed for flatulence.      . traMADol (ULTRAM) 50 MG tablet Take 50 mg by mouth 3 (three) times daily as needed for pain.      . traZODone (DESYREL) 50 MG tablet Take 50 mg by mouth at bedtime as needed for sleep.       . diphenhydrAMINE (BENADRYL) 50 MG capsule Take 50 mg by mouth as directed. Benadryl 50 mg by mouth 1 hr prior to CT.  Dispense two 25 mg caps.      [name] NITROSTAT 0.4 MG SL tablet Place 0.4 mg under the tongue every 5 (five) minutes as needed. For chest pain.        Results for orders placed during the hospital encounter of 06/27/12 (from the past 48 hour(s))  GLUCOSE, CAPILLARY     Status: Abnormal   Collection Time    06/29/12  4:47 PM      Result Value Range   Glucose-Capillary 137 (*) 70 - 99 mg/dL  GLUCOSE, CAPILLARY     Status: None   Collection Time    06/29/12  9:54 PM      Result Value Range   Glucose-Capillary 93  70 - 99 mg/dL  GLUCOSE, CAPILLARY     Status: None   Collection Time    06/30/12  7:48 AM      Result Value Range   Glucose-Capillary 76  70 - 99 mg/dL  GLUCOSE, CAPILLARY     Status: None   Collection Time    06/30/12 11:09 AM      Result Value Range   Glucose-Capillary 76  70 - 99 mg/dL  GLUCOSE, CAPILLARY     Status: None   Collection Time  06/30/12  4:52 PM      Result Value Range   Glucose-Capillary 81  70 - 99 mg/dL  COMPREHENSIVE METABOLIC PANEL     Status: Abnormal   Collection Time    06/30/12  7:01 PM      Result Value Range   Sodium 135   135 - 145 mEq/L   Potassium 4.4  3.5 - 5.1 mEq/L   Chloride 96  96 - 112 mEq/L   CO2 23  19 - 32 mEq/L   Glucose, Bld 82  70 - 99 mg/dL   BUN 33 (*) 6 - 23 mg/dL   Creatinine, Ser 56.21 (*) 0.50 - 1.10 mg/dL   Calcium 9.2  8.4 - 30.8 mg/dL   Total Protein 5.8 (*) 6.0 - 8.3 g/dL   Albumin 2.8 (*) 3.5 - 5.2 g/dL   AST 15  0 - 37 U/L   ALT 8  0 - 35 U/L   Alkaline Phosphatase 74  39 - 117 U/L   Total Bilirubin 0.3  0.3 - 1.2 mg/dL   GFR calc non Af Amer 3 (*) >90 mL/min   GFR calc Af Amer 4 (*) >90 mL/min   Comment:            The eGFR has been calculated     using the CKD EPI equation.     This calculation has not been     validated in all clinical     situations.     eGFR's persistently     <90 mL/min signify     possible Chronic Kidney Disease.  MAGNESIUM     Status: None   Collection Time    06/30/12  7:01 PM      Result Value Range   Magnesium 1.9  1.5 - 2.5 mg/dL  PHOSPHORUS     Status: Abnormal   Collection Time    06/30/12  7:01 PM      Result Value Range   Phosphorus 5.1 (*) 2.3 - 4.6 mg/dL  AMMONIA     Status: None   Collection Time    06/30/12  7:01 PM      Result Value Range   Ammonia 31  11 - 60 umol/L  CBC WITH DIFFERENTIAL     Status: Abnormal   Collection Time    06/30/12  7:01 PM      Result Value Range   WBC 4.9  4.0 - 10.5 K/uL   RBC 3.90  3.87 - 5.11 MIL/uL   Hemoglobin 10.7 (*) 12.0 - 15.0 g/dL   HCT 65.7 (*) 84.6 - 96.2 %   MCV 83.8  78.0 - 100.0 fL   MCH 27.4  26.0 - 34.0 pg   MCHC 32.7  30.0 - 36.0 g/dL   RDW 95.2  84.1 - 32.4 %   Platelets 125 (*) 150 - 400 K/uL   Neutrophils Relative % 59  43 - 77 %   Neutro Abs 2.8  1.7 - 7.7 K/uL   Lymphocytes Relative 25  12 - 46 %   Lymphs Abs 1.2  0.7 - 4.0 K/uL   Monocytes Relative 13 (*) 3 - 12 %   Monocytes Absolute 0.6  0.1 - 1.0 K/uL   Eosinophils Relative 3  0 - 5 %   Eosinophils Absolute 0.2  0.0 - 0.7 K/uL   Basophils Relative 1  0 - 1 %   Basophils Absolute 0.0  0.0 - 0.1 K/uL  TSH  Status: None   Collection Time    06/30/12  7:01 PM      Result Value Range   TSH 1.861  0.350 - 4.500 uIU/mL  T4, FREE     Status: None   Collection Time    06/30/12  7:01 PM      Result Value Range   Free T4 1.30  0.80 - 1.80 ng/dL  GLUCOSE, CAPILLARY     Status: None   Collection Time    06/30/12  8:39 PM      Result Value Range   Glucose-Capillary 96  70 - 99 mg/dL  GLUCOSE, CAPILLARY     Status: None   Collection Time    07/01/12  7:42 AM      Result Value Range   Glucose-Capillary 71  70 - 99 mg/dL  GLUCOSE, CAPILLARY     Status: None   Collection Time    07/01/12 11:59 AM      Result Value Range   Glucose-Capillary 75  70 - 99 mg/dL   No results found.  Review of Systems  Constitutional: Negative for fever.  Psychiatric/Behavioral:       AMS    Blood pressure 153/55, pulse 61, temperature 97.9 F (36.6 C), temperature source Oral, resp. rate 20, weight 154 lb 1.6 oz (69.9 kg), SpO2 100.00%. Physical Exam  Constitutional: She is oriented to person, place, and time.  Cardiovascular: Normal rate and regular rhythm.   Murmur heard. Respiratory: Effort normal and breath sounds normal. She has no wheezes.  GI: Soft. There is no tenderness.  Musculoskeletal: Normal range of motion.  Neurological: She is alert and oriented to person, place, and time.  Tearful   Skin: Skin is warm.  Psychiatric: She has a normal mood and affect. Her behavior is normal. Judgment and thought content normal.     Assessment/Plan MRI/MRA with sedation Claustrophobia Pt aware of procedure benefits and risks and agreeable to proceed Consent signed and in chart  Vence Lalor A 07/01/2012, 2:58 PM

## 2012-07-02 DIAGNOSIS — I2789 Other specified pulmonary heart diseases: Secondary | ICD-10-CM | POA: Diagnosis not present

## 2012-07-02 DIAGNOSIS — I10 Essential (primary) hypertension: Secondary | ICD-10-CM | POA: Diagnosis not present

## 2012-07-02 DIAGNOSIS — Z5189 Encounter for other specified aftercare: Secondary | ICD-10-CM | POA: Diagnosis not present

## 2012-07-02 DIAGNOSIS — D509 Iron deficiency anemia, unspecified: Secondary | ICD-10-CM | POA: Diagnosis not present

## 2012-07-02 DIAGNOSIS — I6990 Unspecified sequelae of unspecified cerebrovascular disease: Secondary | ICD-10-CM | POA: Diagnosis not present

## 2012-07-02 DIAGNOSIS — G9340 Encephalopathy, unspecified: Secondary | ICD-10-CM | POA: Diagnosis not present

## 2012-07-02 DIAGNOSIS — D631 Anemia in chronic kidney disease: Secondary | ICD-10-CM | POA: Diagnosis not present

## 2012-07-02 DIAGNOSIS — I5032 Chronic diastolic (congestive) heart failure: Secondary | ICD-10-CM

## 2012-07-02 DIAGNOSIS — E785 Hyperlipidemia, unspecified: Secondary | ICD-10-CM | POA: Diagnosis not present

## 2012-07-02 DIAGNOSIS — N186 End stage renal disease: Secondary | ICD-10-CM | POA: Diagnosis not present

## 2012-07-02 DIAGNOSIS — G40909 Epilepsy, unspecified, not intractable, without status epilepticus: Secondary | ICD-10-CM | POA: Diagnosis not present

## 2012-07-02 DIAGNOSIS — G9349 Other encephalopathy: Secondary | ICD-10-CM | POA: Diagnosis not present

## 2012-07-02 DIAGNOSIS — R6889 Other general symptoms and signs: Secondary | ICD-10-CM | POA: Diagnosis not present

## 2012-07-02 DIAGNOSIS — IMO0002 Reserved for concepts with insufficient information to code with codable children: Secondary | ICD-10-CM | POA: Diagnosis not present

## 2012-07-02 DIAGNOSIS — G40804 Other epilepsy, intractable, without status epilepticus: Secondary | ICD-10-CM | POA: Diagnosis not present

## 2012-07-02 DIAGNOSIS — R4182 Altered mental status, unspecified: Secondary | ICD-10-CM | POA: Diagnosis not present

## 2012-07-02 DIAGNOSIS — I69991 Dysphagia following unspecified cerebrovascular disease: Secondary | ICD-10-CM | POA: Diagnosis not present

## 2012-07-02 DIAGNOSIS — R569 Unspecified convulsions: Secondary | ICD-10-CM

## 2012-07-02 DIAGNOSIS — I120 Hypertensive chronic kidney disease with stage 5 chronic kidney disease or end stage renal disease: Secondary | ICD-10-CM | POA: Diagnosis not present

## 2012-07-02 DIAGNOSIS — E875 Hyperkalemia: Secondary | ICD-10-CM | POA: Diagnosis not present

## 2012-07-02 DIAGNOSIS — N2581 Secondary hyperparathyroidism of renal origin: Secondary | ICD-10-CM | POA: Diagnosis not present

## 2012-07-02 DIAGNOSIS — I6789 Other cerebrovascular disease: Secondary | ICD-10-CM | POA: Diagnosis not present

## 2012-07-02 LAB — GLUCOSE, CAPILLARY: Glucose-Capillary: 81 mg/dL (ref 70–99)

## 2012-07-02 MED ORDER — DIPHENHYDRAMINE HCL 25 MG PO CAPS: 25.0000 mg | ORAL_CAPSULE | Freq: Four times a day (QID) | ORAL | Status: DC | PRN

## 2012-07-02 NOTE — Clinical Social Work Note (Addendum)
Clinical Social Worker spoke with daughter regarding list of bed offers received. Daughter appeared interested in [facility], but they are considering at the moment. Daughter also questioned that if patient is more "lucid" than yesterday, will she be able to discharge home with home health. CSW will continue to follow.   14:18pm CSW followed up with daughter and informed her that recommendations are still for SNF placement. Per daughter, her sister is to tour [facility] today and will let CSW know if that is the facility to proceed with. CSW is continuing to follow.  15:20pm CSW received call from daughter and they are proceeding with [facility]. Daughter plans to transport patient to facility. CSW notified MD and RN. CSW will complete discharge packet to give to family to take to facility. CSW will sign off, as social work intervention is no longer needed.   Rozetta Nunnery MSW, LCSWA (Coverage for [facility]) [phone]

## 2012-07-02 NOTE — Progress Notes (Signed)
Physical Therapy Treatment Patient Details Name: Chele Cornell MRN: [id number] DOB: Nov 05, 1945 Today's Date: 07/02/2012 Time: 8469-6295 PT Time Calculation (min): 18 min  PT Assessment / Plan / Recommendation  PT Comments   Pt continues to have cognitive impairments with short term deficits and safety awareness.  Pt continues to be unstable with ambulation with at least 3 LOB during ambulation this session.  Pt will need further therapy prior to d/c home.   Follow Up Recommendations  SNF     Equipment Recommendations  None recommended by PT    Frequency Min 3X/week   Progress towards PT Goals Progress towards PT goals: Progressing toward goals  Plan Current plan remains appropriate    Precautions / Restrictions Precautions Precautions: Fall Restrictions Weight Bearing Restrictions: No   Pertinent Vitals/Pain No c/o pain    Mobility  Bed Mobility Bed Mobility: Supine to Sit;Sit to Supine Supine to Sit: 5: Supervision Sit to Supine: 5: Supervision Transfers Transfers: Sit to Stand;Stand to Sit Sit to Stand: 5: Supervision;From bed;From chair/3-in-1;From toilet Stand to Sit: 5: Supervision;To bed;To chair/3-in-1;To toilet Ambulation/Gait Ambulation/Gait Assistance: 4: Min assist Ambulation Distance (Feet): 300 Feet Assistive device: 1 person hand held assist Ambulation/Gait Assistance Details: Pt needed occasional (A) to maintain balance due to at least 3 LOB with lateral sway.  Pt with increase balance deficits with higher balance activities Gait Pattern: Step-through pattern;Shuffle;Lateral hip instability Gait velocity: Slow gait speed    Exercises     PT Diagnosis:    PT Problem List:   PT Treatment Interventions:     PT Goals (current goals can now be found in the care plan section) Acute Rehab PT Goals Patient Stated Goal: Go home. PT Goal Formulation: With patient Time For Goal Achievement: 07/07/12 Potential to Achieve Goals: Good  Visit Information  Last PT Received On: 07/02/12 Assistance Needed: +1 History of Present Illness: Patient is a 67 yo female admitted with AMS, HTN, fever, and diarrhea.  Patient with h/o CVA    Subjective Data  Subjective: "I guess I might need to go to rehab." Patient Stated Goal: Go home.   Cognition  Cognition Arousal/Alertness: Awake/alert Behavior During Therapy: Anxious Overall Cognitive Status: Impaired/Different from baseline Area of Impairment: Memory;Awareness;Problem solving Memory: Decreased short-term memory Following Commands: Follows multi-step commands inconsistently Awareness: Intellectual Problem Solving: Difficulty sequencing General Comments: Pt with short term memory deficits.  Asked pt if she would like for me to get her coffee at end of session.  Then a few minutes later pt stated "I thought someone was going to get me some coffee."  Pt also unable to recall how to call for emergency.  Pt stated "[phone], [phone]."  Then stated "Maybe I do need to go to rehab my memory isn't that great just yet."    Balance  High Level Balance High Level Balance Activites: Turns;Sudden stops;Head turns High Level Balance Comments: Patient with loss of balance during high level balance activities.  End of Session PT - End of Session Equipment Utilized During Treatment: Gait belt Activity Tolerance: Patient tolerated treatment well Patient left: in bed;with call bell/phone within reach;with bed alarm set Nurse Communication: Mobility status   GP     Katti Pelle 07/02/2012, 11:05 AM Jake Shark, PT DPT [phone]

## 2012-07-02 NOTE — Progress Notes (Signed)
Subjective: Feeling better, wants to go  Objective Filed Vitals:   07/01/12 2311 07/02/12 0518 07/02/12 0749 07/02/12 0918  BP: 124/75 172/72 170/65   Pulse: 63 67 69   Temp: 98.8 F (37.1 C) 98.7 F (37.1 C) 98.6 F (37 C)   TempSrc: Oral Oral Oral   Resp: 16 16 16    Weight:      SpO2: 100% 99% 97% 99%    CBC:  Recent Labs Lab 06/27/12 1712 06/28/12 0402 06/28/12 0408 06/30/12 1901  WBC 6.8  --  8.1 4.9  NEUTROABS  --   --   --  2.8  HGB 12.1 11.6* 11.3* 10.7*  HCT 36.4 34.0* 33.8* 32.7*  MCV 83.3  --  83.7 83.8  PLT 162  --  147* 125*   Basic Metabolic Panel:  Recent Labs Lab 06/27/12 1712  06/28/12 0402 06/28/12 0408 06/30/12 1901  NA 135  --  140 140 135  K 6.2*  < > 4.9 4.9 4.4  CL 94*  --  106 101 96  CO2 25  --   --  24 23  GLUCOSE 77  --  79 77 82  BUN 36*  --  34* 37* 33*  CREATININE 8.03*  --  9.60* 8.61* 10.43*  CALCIUM 9.2  --   --  9.0 9.2  PHOS  --   --   --   --  5.1*  < > = values in this interval not displayed.  Physical Exam:  Blood pressure 170/65, pulse 69, temperature 98.6 F (37 C), temperature source Oral, resp. rate 16, weight 67.5 kg (148 lb 13 oz), SpO2 99.00%.  ZOX:WRUEA and alert CVS:RRR Resp:clear Abd:+ BS NTND Ext:no edema Lt Hero graft + bruit NEURO:Ox1. CNI moves all extremities, CNI, follows commands  [facility]   65.5kg EDW  [id number]  2K/2.5Ca Bath   4hrs  LUA AVG  16 Ga  Hect 6ug  Venofer 100mg /wk  Heparin none  EPO none   Assessment/Plan: 1. Fever/AMS/diarrhea- improved, Cdif and BCx's neg, per primary 2. Confusion- better, oriented, ambulating 3. Sec HPTH on hectorol 4. Anemia on aranesp 5. HTN/vol- 2-3 kg over EDW, UF same with HD tomorrow if still here 6. DM 7. ESRD, cont MWF HD 8. Dispo- lives alone, DTR lives nearby, has been ambulating in room. For SNF per SW notes  Vinson Moselle  MD Pager [phone]    Cell  [phone] 07/02/2012, 9:44 AM

## 2012-07-02 NOTE — Progress Notes (Signed)
CSW provided list of bed offers for SNF facilities. CSW also provided SNF listing so that Pt can look at the addresses for selection of facility.   Covering CSW to f/u for bed selection and further d/c planning.    Leron Croak, LCSWA [facility] Emergency Dept.  [phone]

## 2012-07-02 NOTE — Discharge Summary (Signed)
Physician Discharge Summary  [name] ZOX:[id number] DOB: [birth year] DOA: 06/27/2012  PCP: [name], MD  Admit date: 06/27/2012 Discharge date: 07/02/2012  Time spent: 45 minutes  Recommendations for Outpatient Follow-up:  1. Increasing mental status changes; patient appears to be at baseline. Alert and oriented x4, interacting appropriately Brain MRI showed Chronic ischemic changes . No acute infarct. Thyroid panel was within normal limits, as was ammonia level.  2. HTN;improved with increased hydralazine however still high we'll make no further adjustments to the patient being an HD Pt. 3. ESRD; receives HD M/W/F. Seen by Dr. Shirts(Nephrologist) Who arranged for Monday HD 4. Encephalopathy acute; resolved    Discharge Diagnoses:  Principal Problem:   Encephalopathy acute Active Problems:   Essential hypertension, malignant   ESRD   Hyperkalemia, diminished renal excretion   Discharge Condition:  stable  Diet recommendation: Heart healthy  Filed Weights   06/30/12 0452 07/01/12 1842 07/01/12 2247  Weight: 69.9 kg (154 lb 1.6 oz) 70.8 kg (156 lb 1.4 oz) 67.5 kg (148 lb 13 oz)    History of present illness:  [name] is a 67 y.o. BF PMHx HTN, ESRD on M/W/F HD, who presented to the ED with c/o fever, AMS, and diarrhea. Diarrhea onset earlier this morning, followed soon after by AMS and fever. Patient has symptoms of nearly identical AMS symptoms occuring last in Jan of this year determined during that visit to be due to hypertensive encephalopathy. Patient also apparently didn't undergo a full dialysis session yesterday.  In the ED potassium was 6.2 with some peaking of T waves on EKG. She was febrile to 101.x, and in obvious rigors at this time. She was also noted to be incontinent of stool (having uncontrollable diarrhea). Hospitalist has been asked to admit. TODAY patient's mental status back to baseline, somewhat unstable on her feet   Hospital Course:  On  06/27/2012 patient admitted for altered mental status and fever and culture showed no organism present, although patient did receive a full course of vancomycin as a precaution there was no electrolyte imbalance which would account for her mental status changes, no radiological evidence of acute infarct patient's mental status has apparently spontaneously resolved and patient will be discharged to [facility] for rehabilitation secondary to her deconditioning.  Consultations:  Nephrology  Discharge Exam: Filed Vitals:   07/02/12 0749 07/02/12 0918 07/02/12 1039 07/02/12 1146  BP: 170/65  145/74 160/82  Pulse: 69   68  Temp: 98.6 F (37 C)   98.6 F (37 C)  TempSrc: Oral   Oral  Resp: 16   18  Weight:      SpO2: 97% 99%  99%   General: Awake, NAD  Cardiovascular: regular rhythm and rate, negative murmur rubs or gallops, DP/PT pulses 2+  Respiratory:clear patient bilaterally  Abdomen: soft, nontender, nondistended, plus bowel sounds Musculoskeletal: negative pedal edema  Neurologic exam; alert and oriented x4, obeys all commands, cranial nerves  II through XII are intact all extremities strength 5/5, sensation intact  Discharge Instructions   Future Appointments Provider Department Dept Phone   08/08/2012 12:00 PM York Spaniel, MD [facility] [phone]       Medication List    ASK your doctor about these medications       acetaminophen 500 MG tablet  Commonly known as:  TYLENOL  Take 500 mg by mouth every 6 (six) hours as needed for pain.     albuterol-ipratropium 18-103 MCG/ACT inhaler  Commonly known as:  COMBIVENT  Inhale 2 puffs into the lungs every 6 (six) hours as needed. For shortness of breath     amLODipine 10 MG tablet  Commonly known as:  NORVASC  Take 10 mg by mouth daily.     aspirin EC 81 MG tablet  Take 81 mg by mouth daily.     carvedilol 25 MG tablet  Commonly known as:  COREG  Take 25 mg by mouth 2 (two) times daily with a  meal.     cinacalcet 30 MG tablet  Commonly known as:  SENSIPAR  Take 30 mg by mouth at bedtime.     diphenhydrAMINE 50 MG capsule  Commonly known as:  BENADRYL  - Take 50 mg by mouth as directed. Benadryl 50 mg by mouth 1 hr prior to CT.  -   - Dispense two 25 mg caps.     Fluticasone-Salmeterol 100-50 MCG/DOSE Aepb  Commonly known as:  ADVAIR  Inhale 1 puff into the lungs every 12 (twelve) hours.     hydrALAZINE 50 MG tablet  Commonly known as:  APRESOLINE  Take 1 tablet (50 mg total) by mouth 4 (four) times daily.     levETIRAcetam 500 MG tablet  Commonly known as:  KEPPRA  Take 500 mg by mouth daily at 8 pm.     levothyroxine 88 MCG tablet  Commonly known as:  SYNTHROID, LEVOTHROID  Take 88 mcg by mouth daily.     loperamide 2 MG capsule  Commonly known as:  IMODIUM  Take 2 mg by mouth 4 (four) times daily as needed for diarrhea or loose stools.     NITROSTAT 0.4 MG SL tablet  Generic drug:  nitroGLYCERIN  Place 0.4 mg under the tongue every 5 (five) minutes as needed. For chest pain.     oxyCODONE 10 MG 12 hr tablet  Commonly known as:  OXYCONTIN  Take 10 mg by mouth every 12 (twelve) hours.     pravastatin 40 MG tablet  Commonly known as:  PRAVACHOL  Take 40 mg by mouth at bedtime.     sevelamer 800 MG tablet  Commonly known as:  RENAGEL  Take 1,600-2,800 mg by mouth 3 (three) times daily with meals. Take 4 tablets in the morning, two tablets at noon, and 4 tablets at night     simethicone 125 MG chewable tablet  Commonly known as:  MYLICON  Chew 125 mg by mouth every 6 (six) hours as needed for flatulence.     traMADol 50 MG tablet  Commonly known as:  ULTRAM  Take 50 mg by mouth 3 (three) times daily as needed for pain.     traZODone 50 MG tablet  Commonly known as:  DESYREL  Take 50 mg by mouth at bedtime as needed for sleep.       Allergies  Allergen Reactions  . Iohexol Swelling and Other (See Comments)    1970s; passed out and had  facial/tongue swelling.  Requires 13-hour prep with prednisone and benadryl  . Ampicillin Hives and Rash  . Meperidine Hcl Swelling and Rash    Makes tongue swell  . Morphine Rash  . Penicillins Rash  . Valproic Acid And Related Other (See Comments)    Confusion   . Heparin     MDs told her not to take after reaction in ICU  . Pentazocine Lactate     Patient does not remember reaction to this med (Talwin).   . Phenytoin Other (See Comments)  Had reaction while in ICU; doesn't know.  MDs told her not to take ever again.  . Wellbutrin (Bupropion)     seizures  . Ace Inhibitors Rash  . Codeine Itching and Rash      The results of significant diagnostics from this hospitalization (including imaging, microbiology, ancillary and laboratory) are listed below for reference.    Significant Diagnostic Studies: Ct Head Wo Contrast  06/27/2012   *RADIOLOGY REPORT*  Clinical Data: Altered mental status  CT HEAD WITHOUT CONTRAST  Technique:  Contiguous axial images were obtained from the base of the skull through the vertex without contrast.  Comparison: CT 01/20/2012  Findings: Generalized atrophy unchanged.  Multiple areas of chronic ischemia including the cerebellum bilaterally, left frontal temporal lobe, left parietal lobe, and left occipital lobe.  No acute infarct.  Negative for hemorrhage or mass.  Calvarium is intact.  IMPRESSION: Atrophy and extensive chronic ischemia.  No acute abnormality.   Original Report Authenticated By: Janeece Riggers, M.D.   Mr [facility] Wo Contrast  07/01/2012   *RADIOLOGY REPORT*  Clinical Data:  CVA  MRI HEAD WITHOUT CONTRAST MRA HEAD WITHOUT CONTRAST  Technique:  Multiplanar, multiecho pulse sequences of the brain and surrounding structures were obtained without intravenous contrast. Angiographic images of the head were obtained using MRA technique without contrast.  Comparison:  CT 06/27/2012.  MRI 05/09/2011.  MRI HEAD  Findings:  Conscious sedation was  performed due to claustrophobia. The patient received Fentanyl and Versed IV.  Negative for acute infarct.  Chronic infarct left parietal lobe unchanged from the  prior study. Chronic infarcts in the medial thalami bilaterally.  Chronic infarcts in the cerebellum bilaterally, all unchanged.  Negative for intracranial hemorrhage or mass.  Negative for hydrocephalus.  Paranasal sinuses are clear. 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  IMPRESSION: Chronic ischemic changes as above.  No acute infarct.  MRA HEAD  Findings: Both vertebral arteries are patent to the basilar.  AICA patent bilaterally.  PICA not visualized.  The basilar is widely patent.  Fetal origin of the posterior cerebral artery bilaterally. Mild atherosclerotic disease in the posterior cerebral arteries without significant stenosis.  Cavernous carotid is patent bilaterally without stenosis.  Anterior and middle cerebral arteries are patent bilaterally.  Mild atherosclerotic disease in the middle cerebral arteries bilaterally without large vessel occlusion.  Negative for cerebral aneurysm.  IMPRESSION: Mild intracranial atherosclerotic disease.  No large vessel occlusion.   Original Report Authenticated By: Janeece Riggers, M.D.   Mr Brain Wo Contrast  07/01/2012   *RADIOLOGY REPORT*  Clinical Data:  CVA  MRI HEAD WITHOUT CONTRAST MRA HEAD WITHOUT CONTRAST  Technique:  Multiplanar, multiecho pulse sequences of the brain and surrounding structures were obtained without intravenous contrast. Angiographic images of the head were obtained using MRA technique without contrast.  Comparison:  CT 06/27/2012.  MRI 05/09/2011.  MRI HEAD  Findings:  Conscious sedation was performed due to claustrophobia. The patient received Fentanyl and Versed IV.  Negative for acute infarct.  Chronic infarct left parietal lobe unchanged from the  prior study. Chronic infarcts in the medial thalami bilaterally.  Chronic infarcts in the  cerebellum bilaterally, all unchanged.  Negative for intracranial hemorrhage or mass.  Negative for hydrocephalus.  Paranasal sinuses are clear. 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  IMPRESSION: Chronic ischemic changes as above.  No acute infarct.  MRA HEAD  Findings: Both vertebral arteries  are patent to the basilar.  AICA patent bilaterally.  PICA not visualized.  The basilar is widely patent.  Fetal origin of the posterior cerebral artery bilaterally. Mild atherosclerotic disease in the posterior cerebral arteries without significant stenosis.  Cavernous carotid is patent bilaterally without stenosis.  Anterior and middle cerebral arteries are patent bilaterally.  Mild atherosclerotic disease in the middle cerebral arteries bilaterally without large vessel occlusion.  Negative for cerebral aneurysm.  IMPRESSION: Mild intracranial atherosclerotic disease.  No large vessel occlusion.   Original Report Authenticated By: Janeece Riggers, M.D.   Dg Chest Port 1 View  06/27/2012   *RADIOLOGY REPORT*  Clinical Data: Chest pain  PORTABLE CHEST - 1 VIEW  Comparison: Prior chest x-ray 01/20/2012  Findings: Stable cardiomegaly with left ventricular prominence. Similar degree of mild pulmonary vascular congestion without overt edema.  Inspiratory volumes are low.  No focal airspace consolidation, pneumothorax or large effusion.  Atherosclerotic calcifications noted in the transverse aorta.  No acute osseous abnormality.  FLAIR stent graft noted in the right upper extremity.  IMPRESSION:  1.  Slightly low inspiratory volumes, otherwise no acute cardiopulmonary disease. 2.  Stable cardiomegaly with left ventricular prominence and chronic vascular congestion.   Original Report Authenticated By: Malachy Moan, M.D.    Microbiology: Recent Results (from the past 240 hour(s))  CULTURE, BLOOD (ROUTINE X 2)     Status: None   Collection Time    06/27/12  6:30 PM      Result Value  Range Status   Specimen Description BLOOD HAND RIGHT   Final   Special Requests BOTTLES DRAWN AEROBIC ONLY 4CC   Final   Culture  Setup Time 06/28/2012 03:17   Final   Culture     Final   Value:        BLOOD CULTURE RECEIVED NO GROWTH TO DATE CULTURE WILL BE HELD FOR 5 DAYS BEFORE ISSUING A FINAL NEGATIVE REPORT   Report Status PENDING   Incomplete  CULTURE, BLOOD (ROUTINE X 2)     Status: None   Collection Time    06/27/12  6:35 PM      Result Value Range Status   Specimen Description BLOOD ARM RIGHT   Final   Special Requests BOTTLES DRAWN AEROBIC ONLY 4CC   Final   Culture  Setup Time 06/28/2012 03:16   Final   Culture     Final   Value:        BLOOD CULTURE RECEIVED NO GROWTH TO DATE CULTURE WILL BE HELD FOR 5 DAYS BEFORE ISSUING A FINAL NEGATIVE REPORT   Report Status PENDING   Incomplete  CLOSTRIDIUM DIFFICILE BY PCR     Status: None   Collection Time    06/27/12  9:22 PM      Result Value Range Status   C difficile by pcr NEGATIVE  NEGATIVE Final  MRSA PCR SCREENING     Status: None   Collection Time    06/28/12  2:12 AM      Result Value Range Status   MRSA by PCR NEGATIVE  NEGATIVE Final   Comment:            The GeneXpert MRSA Assay (FDA     approved for NASAL specimens     only), is one component of a     comprehensive MRSA colonization     surveillance program. It is not     intended to diagnose MRSA     infection nor to guide or  monitor treatment for     MRSA infections.     Labs: Basic Metabolic Panel:  Recent Labs Lab 06/27/12 1712 06/27/12 2112 06/28/12 0051 06/28/12 0402 06/28/12 0408 06/30/12 1901  NA 135  --   --  140 140 135  K 6.2* 6.8* 6.2* 4.9 4.9 4.4  CL 94*  --   --  106 101 96  CO2 25  --   --   --  24 23  GLUCOSE 77  --   --  79 77 82  BUN 36*  --   --  34* 37* 33*  CREATININE 8.03*  --   --  9.60* 8.61* 10.43*  CALCIUM 9.2  --   --   --  9.0 9.2  MG  --   --   --   --   --  1.9  PHOS  --   --   --   --   --  5.1*   Liver  Function Tests:  Recent Labs Lab 06/27/12 1712 06/30/12 1901  AST 15 15  ALT 10 8  ALKPHOS 89 74  BILITOT 0.3 0.3  PROT 6.8 5.8*  ALBUMIN 3.5 2.8*   No results found for this basename: LIPASE, AMYLASE,  in the last 168 hours  Recent Labs Lab 06/30/12 1901  AMMONIA 31   CBC:  Recent Labs Lab 06/27/12 1712 06/28/12 0402 06/28/12 0408 06/30/12 1901  WBC 6.8  --  8.1 4.9  NEUTROABS  --   --   --  2.8  HGB 12.1 11.6* 11.3* 10.7*  HCT 36.4 34.0* 33.8* 32.7*  MCV 83.3  --  83.7 83.8  PLT 162  --  147* 125*   Cardiac Enzymes: No results found for this basename: CKTOTAL, CKMB, CKMBINDEX, TROPONINI,  in the last 168 hours BNP: BNP (last 3 results) No results found for this basename: PROBNP,  in the last 8760 hours CBG:  Recent Labs Lab 07/01/12 1159 07/01/12 1704 07/01/12 2309 07/02/12 0723 07/02/12 1125  GLUCAP 75 60* 100* 81 114*       Signed:  WOODS, CURTIS, J  [facility] 07/02/2012, 1:45 PM

## 2012-07-03 ENCOUNTER — Encounter: Payer: Self-pay | Admitting: Internal Medicine

## 2012-07-03 ENCOUNTER — Non-Acute Institutional Stay (SKILLED_NURSING_FACILITY): Payer: Medicare Other | Admitting: Internal Medicine

## 2012-07-03 ENCOUNTER — Other Ambulatory Visit: Payer: Self-pay | Admitting: *Deleted

## 2012-07-03 DIAGNOSIS — I5032 Chronic diastolic (congestive) heart failure: Secondary | ICD-10-CM | POA: Diagnosis not present

## 2012-07-03 DIAGNOSIS — F3289 Other specified depressive episodes: Secondary | ICD-10-CM

## 2012-07-03 DIAGNOSIS — I10 Essential (primary) hypertension: Secondary | ICD-10-CM | POA: Diagnosis not present

## 2012-07-03 DIAGNOSIS — G9340 Encephalopathy, unspecified: Secondary | ICD-10-CM

## 2012-07-03 DIAGNOSIS — R5381 Other malaise: Secondary | ICD-10-CM

## 2012-07-03 DIAGNOSIS — F329 Major depressive disorder, single episode, unspecified: Secondary | ICD-10-CM

## 2012-07-03 DIAGNOSIS — G40909 Epilepsy, unspecified, not intractable, without status epilepticus: Secondary | ICD-10-CM

## 2012-07-03 DIAGNOSIS — F32A Depression, unspecified: Secondary | ICD-10-CM

## 2012-07-03 MED ORDER — OXYCODONE HCL 10 MG PO TABS: ORAL_TABLET | ORAL | Status: DC

## 2012-07-03 NOTE — Progress Notes (Signed)
Patient ID: [name], female   DOB: 1945-11-03, 67 y.o.   MRN: [id number] Provider:  Gwenith Spitz. Renato Gails, D.O., C.M.D.  Location:    [facility]  PCP: [name], MD  Code Status: full code   Allergies  Allergen Reactions  . Iohexol Swelling and Other (See Comments)    1970s; passed out and had facial/tongue swelling.  Requires 13-hour prep with prednisone and benadryl  . Ampicillin Hives and Rash  . Meperidine Hcl Swelling and Rash    Makes tongue swell  . Morphine Rash  . Penicillins Rash  . Valproic Acid And Related Other (See Comments)    Confusion   . Heparin     MDs told her not to take after reaction in ICU  . Pentazocine Lactate     Patient does not remember reaction to this med (Talwin).   . Phenytoin Other (See Comments)    Had reaction while in ICU; doesn't know.  MDs told her not to take ever again.  . Wellbutrin (Bupropion)     seizures  . Ace Inhibitors Rash  . Codeine Itching and Rash    Chief Complaint: new admission s/p hospitalization with delirium   HPI: 67 y.o. female  She initially had fever, rigors, and diarrhea with incontinence and was treated with a full course of vancomycin, but no organism was identified.  No stroke was found on imaging.  H/o HTN, ESRD on HD m/w/f Admitted 07/02/12 here She is to f/u with Dr. Anne Hahn 08/08/12.    Review of Systems:  Review of Systems  Constitutional: Positive for chills and malaise/fatigue. Negative for fever.  HENT: Negative for congestion.   Eyes: Negative for blurred vision.  Respiratory: Negative for shortness of breath.   Cardiovascular: Negative for chest pain.  Gastrointestinal: Negative for abdominal pain.  Genitourinary: Negative for dysuria.  Musculoskeletal: Negative for myalgias.  Skin: Negative for rash.  Neurological: Positive for weakness. Negative for dizziness.  Psychiatric/Behavioral: Positive for memory loss.    Past Medical History  Diagnosis Date  . CAD (coronary  artery disease)     stent to RCA  . CVA (cerebral infarction) 2003    no apparent residual  . Hypothyroidism   . PVD (peripheral vascular disease)   . Hyperlipidemia   . Positive PPD     completed rifampin  . Diastolic congestive heart failure   . Hypertension   . COPD (chronic obstructive pulmonary disease) t  . Cancer     clear cell cancer, kidney  . Complication of anesthesia 12/2010    pt is very confused, with AMS with anesthesia  . CVA 07/27/2008    CVA affected cognition and memory per family, no focal deficits.    . ESRD 07/27/2008    ESRD due to HTN and NSAID's, started hemodialysis in 2005 in [place], [place]. Went to [facility] from 2010 to 2012 and since 2012 has been getting dialysis at [facility] on [address] in [place] on a MWF schedule. First access with RUA AVG placed in [place]. Next and current access was LUA AVG placed by Dr. Wyn Quaker in [place] in or around 2012. Has had 2 or 3 procedures on that graft since placed per family. She gets her access work done here in [place] now. She is allergic to heparin and does not get any heparin at dialysis; she had an allergic reaction apparently when in ICU in the past.      . Encephalopathy    Past Surgical History  Procedure Laterality Date  . Abdominal hysterectomy    . Cholecystectomy    . D&cs    . Right ankle repair    . Left nephrectomy    . Vascular surgery  11/2010    graft inserted to left arm   Social History:   reports that she has been smoking Cigarettes.  She has a 106 pack-year smoking history. She has never used smokeless tobacco. She reports that she does not drink alcohol or use illicit drugs.  Family History  Problem Relation Age of Onset  . Heart disease Father   . Hypertension Mother   . Dementia Mother   . Coronary artery disease Sister     Medications: Patient's Medications  New Prescriptions   No medications on file  Previous Medications   ACETAMINOPHEN (TYLENOL) 500 MG  TABLET    Take 500 mg by mouth every 6 (six) hours as needed for pain.   ALBUTEROL-IPRATROPIUM (COMBIVENT) 18-103 MCG/ACT INHALER    Inhale 2 puffs into the lungs every 6 (six) hours as needed. For shortness of breath   AMLODIPINE (NORVASC) 10 MG TABLET    Take 10 mg by mouth daily.    ASPIRIN EC 81 MG TABLET    Take 81 mg by mouth daily.   CARVEDILOL (COREG) 25 MG TABLET    Take 25 mg by mouth 2 (two) times daily with a meal.    CINACALCET (SENSIPAR) 30 MG TABLET    Take 30 mg by mouth at bedtime.   DIPHENHYDRAMINE (BENADRYL) 25 MG CAPSULE    Take 1 capsule (25 mg total) by mouth every 6 (six) hours as needed for itching.   DIPHENHYDRAMINE (BENADRYL) 50 MG CAPSULE    Take 50 mg by mouth as directed. Benadryl 50 mg by mouth 1 hr prior to CT.  Dispense two 25 mg caps.   FLUTICASONE-SALMETEROL (ADVAIR) 100-50 MCG/DOSE AEPB    Inhale 1 puff into the lungs every 12 (twelve) hours.   HYDRALAZINE (APRESOLINE) 50 MG TABLET    Take 1 tablet (50 mg total) by mouth 4 (four) times daily.   LEVETIRACETAM (KEPPRA) 500 MG TABLET    Take 500 mg by mouth daily at 8 pm.   LEVOTHYROXINE (SYNTHROID, LEVOTHROID) 88 MCG TABLET    Take 88 mcg by mouth daily.    LOPERAMIDE (IMODIUM) 2 MG CAPSULE    Take 2 mg by mouth 4 (four) times daily as needed for diarrhea or loose stools.   NITROSTAT 0.4 MG SL TABLET    Place 0.4 mg under the tongue every 5 (five) minutes as needed. For chest pain.   PRAVASTATIN (PRAVACHOL) 40 MG TABLET    Take 40 mg by mouth at bedtime.    SEVELAMER (RENAGEL) 800 MG TABLET    Take 1,600-2,800 mg by mouth 3 (three) times daily with meals. Take 4 tablets in the morning, two tablets at noon, and 4 tablets at night   SIMETHICONE (MYLICON) 125 MG CHEWABLE TABLET    Chew 125 mg by mouth every 6 (six) hours as needed for flatulence.   TRAMADOL (ULTRAM) 50 MG TABLET    Take 50 mg by mouth 3 (three) times daily as needed for pain.   TRAZODONE (DESYREL) 50 MG TABLET    Take 50 mg by mouth at bedtime as  needed for sleep.   Modified Medications   No medications on file  Discontinued Medications   No medications on file     Physical Exam: There were no vitals filed  for this visit. Physical Exam  Constitutional: No distress.  HENT:  Head: Normocephalic and atraumatic.  Right Ear: External ear normal.  Left Ear: External ear normal.  Nose: Nose normal.  Mouth/Throat: Oropharynx is clear and moist.  Eyes: Conjunctivae and EOM are normal. Pupils are equal, round, and reactive to light.  Neck: Normal range of motion.  Cardiovascular: Normal rate, regular rhythm, normal heart sounds and intact distal pulses.   Fistula with normal bruit and thrill  Pulmonary/Chest: Effort normal and breath sounds normal.  Abdominal: Soft. Bowel sounds are normal. She exhibits no distension. There is no tenderness.  Musculoskeletal: Normal range of motion.  Neurological: She is alert.  confused  Skin: Skin is warm and dry.     Labs reviewed: Basic Metabolic Panel:  Recent Labs  29/56/21 0700 01/22/12 1223 06/27/12 1712  06/28/12 0402 06/28/12 0408 06/30/12 1901  NA 139 134* 135  --  140 140 135  K 3.7 4.4 6.2*  < > 4.9 4.9 4.4  CL 100 94* 94*  --  106 101 96  CO2 30 26 25   --   --  24 23  GLUCOSE 79 89 77  --  79 77 82  BUN 9 21 36*  --  34* 37* 33*  CREATININE 4.44* 7.29* 8.03*  --  9.60* 8.61* 10.43*  CALCIUM 11.1* 11.8* 9.2  --   --  9.0 9.2  MG  --   --   --   --   --   --  1.9  PHOS 4.3 6.0*  --   --   --   --  5.1*  < > = values in this interval not displayed. Liver Function Tests:  Recent Labs  01/20/12 1546  01/22/12 1223 06/27/12 1712 06/30/12 1901  AST 20  --   --  15 15  ALT 9  --   --  10 8  ALKPHOS 59  --   --  89 74  BILITOT 0.3  --   --  0.3 0.3  PROT 6.2  --   --  6.8 5.8*  ALBUMIN 3.2*  < > 3.2* 3.5 2.8*  < > = values in this interval not displayed. No results found for this basename: LIPASE, AMYLASE,  in the last 8760 hours  Recent Labs  06/30/12 1901   AMMONIA 31   CBC:  Recent Labs  01/20/12 1546  06/27/12 1712 06/28/12 0402 06/28/12 0408 06/30/12 1901  WBC 3.5*  < > 6.8  --  8.1 4.9  NEUTROABS 1.5*  --   --   --   --  2.8  HGB 11.9*  < > 12.1 11.6* 11.3* 10.7*  HCT 37.7  < > 36.4 34.0* 33.8* 32.7*  MCV 83.8  < > 83.3  --  83.7 83.8  PLT 97*  < > 162  --  147* 125*  < > = values in this interval not displayed.  CBG:  Recent Labs  07/01/12 2309 07/02/12 0723 07/02/12 1125  GLUCAP 100* 81 114*   Imaging and Procedures:   Ct Head Wo Contrast  06/27/2012 *RADIOLOGY REPORT* Clinical Data: Altered mental status CT HEAD WITHOUT CONTRAST Technique: Contiguous axial images were obtained from the base of the skull through the vertex without contrast. Comparison: CT 01/20/2012 Findings: Generalized atrophy unchanged. Multiple areas of chronic ischemia including the cerebellum bilaterally, left frontal temporal lobe, left parietal lobe, and left occipital lobe. No acute infarct. Negative for hemorrhage or mass. Calvarium is intact.  IMPRESSION: Atrophy and extensive chronic ischemia. No acute abnormality. Original Report Authenticated By: Janeece Riggers, M.D.   Mr [facility] Wo Contrast  07/01/2012 *RADIOLOGY REPORT* Clinical Data: CVA MRI HEAD WITHOUT CONTRAST MRA HEAD WITHOUT CONTRAST Technique: Multiplanar, multiecho pulse sequences of the brain and surrounding structures were obtained without intravenous contrast. Angiographic images of the head were obtained using MRA technique without contrast. Comparison: CT 06/27/2012. MRI 05/09/2011. MRI HEAD Findings: Conscious sedation was performed due to claustrophobia. The patient received Fentanyl and Versed IV. Negative for acute infarct. Chronic infarct left parietal lobe unchanged from the prior study. Chronic infarcts in the medial thalami bilaterally. Chronic infarcts in the cerebellum bilaterally, all unchanged. Negative for intracranial hemorrhage or mass. Negative for hydrocephalus. Paranasal  sinuses are clear. 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 IMPRESSION: Chronic ischemic changes as above. No acute infarct. MRA HEAD Findings: Both vertebral arteries are patent to the basilar. AICA patent bilaterally. PICA not visualized. The basilar is widely patent. Fetal origin of the posterior cerebral artery bilaterally. Mild atherosclerotic disease in the posterior cerebral arteries without significant stenosis. Cavernous carotid is patent bilaterally without stenosis. Anterior and middle cerebral arteries are patent bilaterally. Mild atherosclerotic disease in the middle cerebral arteries bilaterally without large vessel occlusion. Negative for cerebral aneurysm. IMPRESSION: Mild intracranial atherosclerotic disease. No large vessel occlusion. Original Report Authenticated By: Janeece Riggers, M.D.   Mr Brain Wo Contrast  07/01/2012 *RADIOLOGY REPORT* Clinical Data: CVA MRI HEAD WITHOUT CONTRAST MRA HEAD WITHOUT CONTRAST Technique: Multiplanar, multiecho pulse sequences of the brain and surrounding structures were obtained without intravenous contrast. Angiographic images of the head were obtained using MRA technique without contrast. Comparison: CT 06/27/2012. MRI 05/09/2011.  MRI HEAD Findings: Conscious sedation was performed due to claustrophobia. The patient received Fentanyl and Versed IV. Negative for acute infarct. Chronic infarct left parietal lobe unchanged from the prior study. Chronic infarcts in the medial thalami bilaterally. Chronic infarcts in the cerebellum bilaterally, all unchanged. Negative for intracranial hemorrhage or mass. Negative for hydrocephalus. Paranasal sinuses are clear. 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 IMPRESSION: Chronic ischemic changes as above. No acute infarct.  MRA HEAD Findings: Both vertebral arteries are patent to the basilar. AICA patent bilaterally. PICA not visualized. The basilar  is widely patent. Fetal origin of the posterior cerebral artery bilaterally. Mild atherosclerotic disease in the posterior cerebral arteries without significant stenosis. Cavernous carotid is patent bilaterally without stenosis. Anterior and middle cerebral arteries are patent bilaterally. Mild atherosclerotic disease in the middle cerebral arteries bilaterally without large vessel occlusion. Negative for cerebral aneurysm. IMPRESSION: Mild intracranial atherosclerotic disease. No large vessel occlusion. Original Report Authenticated By: Janeece Riggers, M.D.   Dg Chest Port 1 View  06/27/2012 *RADIOLOGY REPORT* Clinical Data: Chest pain PORTABLE CHEST - 1 VIEW Comparison: Prior chest x-ray 01/20/2012 Findings: Stable cardiomegaly with left ventricular prominence. Similar degree of mild pulmonary vascular congestion without overt edema. Inspiratory volumes are low. No focal airspace consolidation, pneumothorax or large effusion. Atherosclerotic calcifications noted in the transverse aorta. No acute osseous abnormality. FLAIR stent graft noted in the right upper extremity. IMPRESSION: 1. Slightly low inspiratory volumes, otherwise no acute cardiopulmonary disease. 2. Stable cardiomegaly with left ventricular prominence and chronic vascular congestion. Original Report Authenticated By: Malachy Moan, M.D.     Assessment/Plan 1. Essential hypertension, malignant -hydralazine was added during hospital stay, will monitor bp here and adjust meds as needed  2.  Chronic diastolic heart failure -monitor weights, fluid intake, NAS diet -cont current meds  3. Encephalopathy acute -now oriented, but still confused to some degree which is apparently typical per notes since a prior stroke -not helped by end stage renal disease on HD -gets benadryl for itching during hd which does not help cognition in already mildly cognitively impaired pts f/u Dr. Anne Hahn with [facility] -probably should be started on  dementia meds in the outpatient world when she is entirely stabilized  4. Epilepsy -cont current treatment with keppra  5. Depression -mood ok today, monitor  6. Physical deconditioning -here for short term rehab after her hospitalization for delirium/encephalopathy

## 2012-07-04 LAB — CULTURE, BLOOD (ROUTINE X 2)

## 2012-07-24 DIAGNOSIS — E1129 Type 2 diabetes mellitus with other diabetic kidney complication: Secondary | ICD-10-CM | POA: Diagnosis not present

## 2012-07-24 DIAGNOSIS — N189 Chronic kidney disease, unspecified: Secondary | ICD-10-CM | POA: Diagnosis not present

## 2012-08-01 DIAGNOSIS — N186 End stage renal disease: Secondary | ICD-10-CM | POA: Diagnosis not present

## 2012-08-02 DIAGNOSIS — N2581 Secondary hyperparathyroidism of renal origin: Secondary | ICD-10-CM | POA: Diagnosis not present

## 2012-08-02 DIAGNOSIS — D509 Iron deficiency anemia, unspecified: Secondary | ICD-10-CM | POA: Diagnosis not present

## 2012-08-02 DIAGNOSIS — N186 End stage renal disease: Secondary | ICD-10-CM | POA: Diagnosis not present

## 2012-08-02 DIAGNOSIS — D631 Anemia in chronic kidney disease: Secondary | ICD-10-CM | POA: Diagnosis not present

## 2012-08-07 ENCOUNTER — Encounter: Payer: Self-pay | Admitting: Neurology

## 2012-08-07 DIAGNOSIS — G40309 Generalized idiopathic epilepsy and epileptic syndromes, not intractable, without status epilepticus: Secondary | ICD-10-CM

## 2012-08-08 ENCOUNTER — Ambulatory Visit (INDEPENDENT_AMBULATORY_CARE_PROVIDER_SITE_OTHER): Payer: Medicare Other | Admitting: Neurology

## 2012-08-08 ENCOUNTER — Encounter: Payer: Self-pay | Admitting: Neurology

## 2012-08-08 VITALS — BP 143/68 | HR 65 | Ht 63.75 in | Wt 148.0 lb

## 2012-08-08 DIAGNOSIS — R569 Unspecified convulsions: Secondary | ICD-10-CM | POA: Diagnosis not present

## 2012-08-08 NOTE — Progress Notes (Signed)
Reason for visit: Seizures  [name] is an 67 y.o. female  History of present illness:  [name] is a 67 year old left-handed black female with a history of end-stage renal disease and a history of seizures. The patient suffered a seizure in April 2013 when she mistakenly forgot her medication. The patient has done quite well on Keppra taking 500 mg daily. The patient is tolerating the medication well without drowsiness or irritability. The patient has a driver's license, but she indicates that she has not driven since the seizure. The patient reports no other significant medical issues that have come up since last seen. The patient feels well otherwise. The patient does report some numbness and tingly sensations in the hands, most prominent in the morning when she first awakens. The patient is not interested in pursuing an evaluation for carpal tunnel syndrome.  Past Medical History  Diagnosis Date  . CAD (coronary artery disease)     stent to RCA  . CVA (cerebral infarction) 2003    no apparent residual  . Hypothyroidism   . PVD (peripheral vascular disease)   . Hyperlipidemia   . Positive PPD     completed rifampin  . Diastolic congestive heart failure   . Hypertension   . COPD (chronic obstructive pulmonary disease) t  . Cancer     clear cell cancer, kidney  . Complication of anesthesia 12/2010    pt is very confused, with AMS with anesthesia  . CVA 07/27/2008    CVA affected cognition and memory per family, no focal deficits.    . ESRD 07/27/2008    ESRD due to HTN and NSAID's, started hemodialysis in 2005 in [place], [place]. Went to [facility] from 2010 to 2012 and since 2012 has been getting dialysis at [facility] on [address] in [place] on a MWF schedule. First access with RUA AVG placed in [place]. Next and current access was LUA AVG placed by Dr. Wyn Quaker in [place] in or around 2012. Has had 2 or 3 procedures on that graft since placed per family. She gets  her access work done here in [place] now. She is allergic to heparin and does not get any heparin at dialysis; she had an allergic reaction apparently when in ICU in the past.      . Encephalopathy   . Seizures   . Dyslipidemia   . Chronic renal insufficiency     On hemodialysis  . Arthritis of shoulder     Bilateral  . Depression     Past Surgical History  Procedure Laterality Date  . Abdominal hysterectomy    . Cholecystectomy    . D&cs    . Right ankle repair    . Left nephrectomy    . Vascular surgery  11/2010    graft inserted to left arm  . Nephrectomy Left     Malignant tumor  . Cataract extraction Bilateral   . Tonsillectomy      Family History  Problem Relation Age of Onset  . Heart disease Father   . Hypertension Mother   . Dementia Mother   . Coronary artery disease Sister   . Heart attack Sister   . Hypertension Brother     Social history:  reports that she has been smoking Cigarettes.  She has a 106 pack-year smoking history. She has never used smokeless tobacco. She reports that  drinks alcohol. She reports that she does not use illicit drugs.  Allergies:  Allergies  Allergen Reactions  .  Iohexol Swelling and Other (See Comments)    1970s; passed out and had facial/tongue swelling.  Requires 13-hour prep with prednisone and benadryl  . Ampicillin Hives and Rash  . Meperidine Hcl Swelling and Rash    Makes tongue swell  . Morphine Rash  . Penicillins Rash  . Valproic Acid And Related Other (See Comments)    Confusion   . Heparin     MDs told her not to take after reaction in ICU  . Pentazocine Lactate     Patient does not remember reaction to this med (Talwin).   . Phenytoin Other (See Comments)    Had reaction while in ICU; doesn't know.  MDs told her not to take ever again.  . Wellbutrin (Bupropion)     seizures  . Ace Inhibitors Rash  . Codeine Itching and Rash    Medications:  Current Outpatient Prescriptions on File Prior to Visit   Medication Sig Dispense Refill  . acetaminophen (TYLENOL) 500 MG tablet Take 500 mg by mouth every 6 (six) hours as needed for pain.      [name] albuterol-ipratropium (COMBIVENT) 18-103 MCG/ACT inhaler Inhale 2 puffs into the lungs every 6 (six) hours as needed. For shortness of breath      . amLODipine (NORVASC) 10 MG tablet Take 10 mg by mouth daily.       [name] aspirin EC 81 MG tablet Take 81 mg by mouth daily.      . carvedilol (COREG) 25 MG tablet Take 25 mg by mouth 2 (two) times daily with a meal.       . cinacalcet (SENSIPAR) 30 MG tablet Take 30 mg by mouth at bedtime.      . diphenhydrAMINE (BENADRYL) 25 mg capsule Take 1 capsule (25 mg total) by mouth every 6 (six) hours as needed for itching.  30 capsule  0  . diphenhydrAMINE (BENADRYL) 50 MG capsule Take 50 mg by mouth as directed. Benadryl 50 mg by mouth 1 hr prior to CT.  Dispense two 25 mg caps.      . Fluticasone-Salmeterol (ADVAIR) 100-50 MCG/DOSE AEPB Inhale 1 puff into the lungs every 12 (twelve) hours.      . hydrALAZINE (APRESOLINE) 50 MG tablet Take 1 tablet (50 mg total) by mouth 4 (four) times daily.  120 tablet  1  . levETIRAcetam (KEPPRA) 500 MG tablet Take 500 mg by mouth daily at 8 pm.      . levothyroxine (SYNTHROID, LEVOTHROID) 88 MCG tablet Take 88 mcg by mouth daily.       [name] loperamide (IMODIUM) 2 MG capsule Take 2 mg by mouth 4 (four) times daily as needed for diarrhea or loose stools.      [name] NITROSTAT 0.4 MG SL tablet Place 0.4 mg under the tongue every 5 (five) minutes as needed. For chest pain.      . pravastatin (PRAVACHOL) 40 MG tablet Take 40 mg by mouth at bedtime.       . sevelamer (RENAGEL) 800 MG tablet Take 1,600-2,800 mg by mouth 3 (three) times daily with meals. Take 4 tablets in the morning, two tablets at noon, and 4 tablets at night      . simethicone (MYLICON) 125 MG chewable tablet Chew 125 mg by mouth every 6 (six) hours as needed for flatulence.      . traZODone (DESYREL) 50 MG tablet Take 50 mg by  mouth at bedtime as needed for sleep.        No current  facility-administered medications on file prior to visit.    ROS:  Out of a complete 14 system review of symptoms, the patient complains only of the following symptoms, and all other reviewed systems are negative.  Numbness in the hands History of seizures  Blood pressure 143/68, pulse 65, height 5' 3.75" (1.619 m), weight 148 lb (67.132 kg).  Physical Exam  General: The patient is alert and cooperative at the time of the examination.  Skin: No significant peripheral edema is noted.   Neurologic Exam  Cranial nerves: Facial symmetry is present. Speech is normal, no aphasia or dysarthria is noted. Extraocular movements are full. Visual fields are full.  Motor: The patient has good strength in all 4 extremities.  Coordination: The patient has good finger-nose-finger and heel-to-shin bilaterally.  Gait and station: The patient has a normal gait. Tandem gait is slightly unsteady. Romberg is negative, but is unsteady. No drift is seen.  Reflexes: Deep tendon reflexes are symmetric, but are depressed.   Assessment/Plan:  1. History seizures  2. End-stage renal disease  The patient is doing quite well at this time. I have indicated that she may return to driving at this time. The patient will continue her Keppra, and she will followup in one year.  Marlan Palau MD 08/08/2012 7:06 PM  [facility] [address] [place], [place] [address]  Phone [phone] Fax [phone]

## 2012-08-29 ENCOUNTER — Other Ambulatory Visit: Payer: Self-pay | Admitting: Internal Medicine

## 2012-09-01 DIAGNOSIS — N186 End stage renal disease: Secondary | ICD-10-CM | POA: Diagnosis not present

## 2012-09-03 DIAGNOSIS — N186 End stage renal disease: Secondary | ICD-10-CM | POA: Diagnosis not present

## 2012-09-03 DIAGNOSIS — N2581 Secondary hyperparathyroidism of renal origin: Secondary | ICD-10-CM | POA: Diagnosis not present

## 2012-09-03 DIAGNOSIS — Z23 Encounter for immunization: Secondary | ICD-10-CM | POA: Diagnosis not present

## 2012-09-03 DIAGNOSIS — D509 Iron deficiency anemia, unspecified: Secondary | ICD-10-CM | POA: Diagnosis not present

## 2012-09-03 DIAGNOSIS — D631 Anemia in chronic kidney disease: Secondary | ICD-10-CM | POA: Diagnosis not present

## 2012-09-04 DIAGNOSIS — D509 Iron deficiency anemia, unspecified: Secondary | ICD-10-CM | POA: Diagnosis not present

## 2012-09-04 DIAGNOSIS — Z23 Encounter for immunization: Secondary | ICD-10-CM | POA: Diagnosis not present

## 2012-09-04 DIAGNOSIS — D631 Anemia in chronic kidney disease: Secondary | ICD-10-CM | POA: Diagnosis not present

## 2012-09-04 DIAGNOSIS — N2581 Secondary hyperparathyroidism of renal origin: Secondary | ICD-10-CM | POA: Diagnosis not present

## 2012-09-04 DIAGNOSIS — N186 End stage renal disease: Secondary | ICD-10-CM | POA: Diagnosis not present

## 2012-09-06 DIAGNOSIS — N186 End stage renal disease: Secondary | ICD-10-CM | POA: Diagnosis not present

## 2012-09-06 DIAGNOSIS — N2581 Secondary hyperparathyroidism of renal origin: Secondary | ICD-10-CM | POA: Diagnosis not present

## 2012-09-06 DIAGNOSIS — D631 Anemia in chronic kidney disease: Secondary | ICD-10-CM | POA: Diagnosis not present

## 2012-09-06 DIAGNOSIS — Z23 Encounter for immunization: Secondary | ICD-10-CM | POA: Diagnosis not present

## 2012-09-06 DIAGNOSIS — D509 Iron deficiency anemia, unspecified: Secondary | ICD-10-CM | POA: Diagnosis not present

## 2012-09-09 DIAGNOSIS — D509 Iron deficiency anemia, unspecified: Secondary | ICD-10-CM | POA: Diagnosis not present

## 2012-09-09 DIAGNOSIS — Z23 Encounter for immunization: Secondary | ICD-10-CM | POA: Diagnosis not present

## 2012-09-09 DIAGNOSIS — D631 Anemia in chronic kidney disease: Secondary | ICD-10-CM | POA: Diagnosis not present

## 2012-09-09 DIAGNOSIS — N186 End stage renal disease: Secondary | ICD-10-CM | POA: Diagnosis not present

## 2012-09-09 DIAGNOSIS — N2581 Secondary hyperparathyroidism of renal origin: Secondary | ICD-10-CM | POA: Diagnosis not present

## 2012-09-11 DIAGNOSIS — N186 End stage renal disease: Secondary | ICD-10-CM | POA: Diagnosis not present

## 2012-09-11 DIAGNOSIS — Z23 Encounter for immunization: Secondary | ICD-10-CM | POA: Diagnosis not present

## 2012-09-11 DIAGNOSIS — N2581 Secondary hyperparathyroidism of renal origin: Secondary | ICD-10-CM | POA: Diagnosis not present

## 2012-09-11 DIAGNOSIS — D631 Anemia in chronic kidney disease: Secondary | ICD-10-CM | POA: Diagnosis not present

## 2012-09-11 DIAGNOSIS — D509 Iron deficiency anemia, unspecified: Secondary | ICD-10-CM | POA: Diagnosis not present

## 2012-09-13 DIAGNOSIS — D631 Anemia in chronic kidney disease: Secondary | ICD-10-CM | POA: Diagnosis not present

## 2012-09-13 DIAGNOSIS — D509 Iron deficiency anemia, unspecified: Secondary | ICD-10-CM | POA: Diagnosis not present

## 2012-09-13 DIAGNOSIS — N2581 Secondary hyperparathyroidism of renal origin: Secondary | ICD-10-CM | POA: Diagnosis not present

## 2012-09-13 DIAGNOSIS — Z23 Encounter for immunization: Secondary | ICD-10-CM | POA: Diagnosis not present

## 2012-09-13 DIAGNOSIS — N186 End stage renal disease: Secondary | ICD-10-CM | POA: Diagnosis not present

## 2012-09-16 ENCOUNTER — Other Ambulatory Visit: Payer: Self-pay

## 2012-09-16 ENCOUNTER — Emergency Department (HOSPITAL_COMMUNITY)
Admission: EM | Admit: 2012-09-16 | Discharge: 2012-09-16 | Disposition: A | Discharge status: Home / Self Care | Payer: Medicare Other | Attending: Emergency Medicine | Admitting: Emergency Medicine

## 2012-09-16 ENCOUNTER — Encounter (HOSPITAL_COMMUNITY): Payer: Self-pay

## 2012-09-16 ENCOUNTER — Emergency Department (HOSPITAL_COMMUNITY): Payer: Medicare Other

## 2012-09-16 DIAGNOSIS — Z881 Allergy status to other antibiotic agents status: Secondary | ICD-10-CM | POA: Diagnosis not present

## 2012-09-16 DIAGNOSIS — R059 Cough, unspecified: Secondary | ICD-10-CM | POA: Diagnosis not present

## 2012-09-16 DIAGNOSIS — F17200 Nicotine dependence, unspecified, uncomplicated: Secondary | ICD-10-CM | POA: Diagnosis not present

## 2012-09-16 DIAGNOSIS — N186 End stage renal disease: Secondary | ICD-10-CM | POA: Diagnosis not present

## 2012-09-16 DIAGNOSIS — R062 Wheezing: Secondary | ICD-10-CM | POA: Insufficient documentation

## 2012-09-16 DIAGNOSIS — I739 Peripheral vascular disease, unspecified: Secondary | ICD-10-CM | POA: Insufficient documentation

## 2012-09-16 DIAGNOSIS — J3489 Other specified disorders of nose and nasal sinuses: Secondary | ICD-10-CM | POA: Diagnosis not present

## 2012-09-16 DIAGNOSIS — R569 Unspecified convulsions: Secondary | ICD-10-CM | POA: Insufficient documentation

## 2012-09-16 DIAGNOSIS — I5030 Unspecified diastolic (congestive) heart failure: Secondary | ICD-10-CM | POA: Diagnosis not present

## 2012-09-16 DIAGNOSIS — E039 Hypothyroidism, unspecified: Secondary | ICD-10-CM | POA: Diagnosis not present

## 2012-09-16 DIAGNOSIS — J449 Chronic obstructive pulmonary disease, unspecified: Secondary | ICD-10-CM | POA: Insufficient documentation

## 2012-09-16 DIAGNOSIS — I120 Hypertensive chronic kidney disease with stage 5 chronic kidney disease or end stage renal disease: Secondary | ICD-10-CM | POA: Insufficient documentation

## 2012-09-16 DIAGNOSIS — R05 Cough: Secondary | ICD-10-CM

## 2012-09-16 DIAGNOSIS — J4489 Other specified chronic obstructive pulmonary disease: Secondary | ICD-10-CM | POA: Diagnosis not present

## 2012-09-16 DIAGNOSIS — R0609 Other forms of dyspnea: Secondary | ICD-10-CM | POA: Diagnosis not present

## 2012-09-16 DIAGNOSIS — E785 Hyperlipidemia, unspecified: Secondary | ICD-10-CM | POA: Diagnosis not present

## 2012-09-16 DIAGNOSIS — J309 Allergic rhinitis, unspecified: Secondary | ICD-10-CM | POA: Diagnosis not present

## 2012-09-16 DIAGNOSIS — R630 Anorexia: Secondary | ICD-10-CM | POA: Insufficient documentation

## 2012-09-16 DIAGNOSIS — F329 Major depressive disorder, single episode, unspecified: Secondary | ICD-10-CM | POA: Diagnosis not present

## 2012-09-16 DIAGNOSIS — Z79899 Other long term (current) drug therapy: Secondary | ICD-10-CM | POA: Insufficient documentation

## 2012-09-16 DIAGNOSIS — R112 Nausea with vomiting, unspecified: Secondary | ICD-10-CM | POA: Diagnosis not present

## 2012-09-16 DIAGNOSIS — Z880 Allergy status to penicillin: Secondary | ICD-10-CM | POA: Insufficient documentation

## 2012-09-16 DIAGNOSIS — Z8614 Personal history of Methicillin resistant Staphylococcus aureus infection: Secondary | ICD-10-CM | POA: Insufficient documentation

## 2012-09-16 DIAGNOSIS — M19019 Primary osteoarthritis, unspecified shoulder: Secondary | ICD-10-CM | POA: Insufficient documentation

## 2012-09-16 DIAGNOSIS — Z992 Dependence on renal dialysis: Secondary | ICD-10-CM | POA: Insufficient documentation

## 2012-09-16 DIAGNOSIS — R0989 Other specified symptoms and signs involving the circulatory and respiratory systems: Secondary | ICD-10-CM | POA: Insufficient documentation

## 2012-09-16 DIAGNOSIS — Z85528 Personal history of other malignant neoplasm of kidney: Secondary | ICD-10-CM | POA: Insufficient documentation

## 2012-09-16 DIAGNOSIS — Z885 Allergy status to narcotic agent status: Secondary | ICD-10-CM | POA: Insufficient documentation

## 2012-09-16 DIAGNOSIS — R0600 Dyspnea, unspecified: Secondary | ICD-10-CM

## 2012-09-16 DIAGNOSIS — I2510 Atherosclerotic heart disease of native coronary artery without angina pectoris: Secondary | ICD-10-CM | POA: Diagnosis not present

## 2012-09-16 DIAGNOSIS — Z888 Allergy status to other drugs, medicaments and biological substances status: Secondary | ICD-10-CM | POA: Insufficient documentation

## 2012-09-16 DIAGNOSIS — R0602 Shortness of breath: Secondary | ICD-10-CM | POA: Diagnosis not present

## 2012-09-16 DIAGNOSIS — Z8673 Personal history of transient ischemic attack (TIA), and cerebral infarction without residual deficits: Secondary | ICD-10-CM | POA: Insufficient documentation

## 2012-09-16 DIAGNOSIS — Z7982 Long term (current) use of aspirin: Secondary | ICD-10-CM | POA: Insufficient documentation

## 2012-09-16 DIAGNOSIS — F3289 Other specified depressive episodes: Secondary | ICD-10-CM | POA: Insufficient documentation

## 2012-09-16 LAB — CBC WITH DIFFERENTIAL/PLATELET
Basophils Absolute: 0 10*3/uL (ref 0.0–0.1)
HCT: 32.5 % — ABNORMAL LOW (ref 36.0–46.0)
Lymphocytes Relative: 20 % (ref 12–46)
Lymphs Abs: 1.2 10*3/uL (ref 0.7–4.0)
Neutro Abs: 4 10*3/uL (ref 1.7–7.7)
Platelets: 156 10*3/uL (ref 150–400)
RBC: 3.91 MIL/uL (ref 3.87–5.11)
RDW: 14.3 % (ref 11.5–15.5)
WBC: 5.8 10*3/uL (ref 4.0–10.5)

## 2012-09-16 LAB — BASIC METABOLIC PANEL
CO2: 25 mEq/L (ref 19–32)
Chloride: 92 mEq/L — ABNORMAL LOW (ref 96–112)
Glucose, Bld: 77 mg/dL (ref 70–99)
Sodium: 134 mEq/L — ABNORMAL LOW (ref 135–145)

## 2012-09-16 MED ORDER — ALBUTEROL SULFATE (5 MG/ML) 0.5% IN NEBU
2.5000 mg | INHALATION_SOLUTION | RESPIRATORY_TRACT | Status: DC
  Administered 2012-09-16: 2.5 mg via RESPIRATORY_TRACT
  Filled 2012-09-16: qty 0.5

## 2012-09-16 MED ORDER — ONDANSETRON HCL 4 MG/2ML IJ SOLN
4.0000 mg | Freq: Once | INTRAMUSCULAR | Status: AC
  Administered 2012-09-16: 4 mg via INTRAVENOUS
  Filled 2012-09-16: qty 2

## 2012-09-16 MED ORDER — ONDANSETRON 4 MG PO TBDP: 4.0000 mg | ORAL_TABLET | Freq: Three times a day (TID) | ORAL | Status: DC | PRN

## 2012-09-16 MED ORDER — IPRATROPIUM BROMIDE 0.02 % IN SOLN
0.5000 mg | RESPIRATORY_TRACT | Status: DC
  Administered 2012-09-16: 0.5 mg via RESPIRATORY_TRACT
  Filled 2012-09-16: qty 2.5

## 2012-09-16 MED ORDER — SODIUM CHLORIDE 0.9 % IV SOLN
Freq: Once | INTRAVENOUS | Status: AC
  Administered 2012-09-16: 09:00:00 via INTRAVENOUS

## 2012-09-16 NOTE — ED Provider Notes (Signed)
I have personally seen and examined the patient.  I have discussed the plan of care with the resident.  I have reviewed the documentation on PMH/FH/Soc. History.  I have reviewed the documentation of the resident and agree.  I have reviewed and agree with the ECG interpretation(s) documented by the resident.   Pt well appearing, stable in ER and stable for outpatient management and encouraged to have dialysis treatment today   Joya Gaskins, MD 09/16/12 1255

## 2012-09-16 NOTE — ED Provider Notes (Signed)
Patient seen/examined in the Emergency Department in conjunction with Resident Physician Provider Hess Patient reports cough and shortness of breath. Exam : awake/alert, no distress, bilateral wheezing noted but no resp distress noted Plan: plan is to treat with nebs, check CXR/EKG and reassess    Joya Gaskins, MD 09/16/12 [phone]

## 2012-09-16 NOTE — ED Provider Notes (Signed)
CSN: [id number]     Arrival date & time 09/16/12  7829 History   First MD Initiated Contact with Patient 09/16/12 [phone]     Chief Complaint  Patient presents with  . Nasal Congestion  . Cough  . Shortness of Breath   HPI Comments: Pt is a 67 y/o female with significant PMHx for HTN, HLD, CVA, CAD, ESRD on dialysis, COPD, current 1 ppd smoker for 50 yrs who presents to the ED with worsening productive cough, dyspnea at rest, and intermittent nausea and vomiting.  Pt states she was in her normal state of health up until two days ago when she started to have productive cough.  Pt states she had clear sputum production and at baseline does not have productive cough.  She continued to have productive cough over the last day into today and started to develop some nausea and several episodes of vomitus, non bloody/non bilious, over the last day as well.  This AM, she was going to dialysis, continued to have some cough and called her daughter who brought her to the ED.  She denies any fever, chills, sweats, chest pain, HA, blurred vision, nausea, vomiting, diarrhea, shortness of breath at rest, lower extremity edema, dysuria, abdominal pain, confusion, generalized myalgias.  She has not tried anything for the cough or nausea and does not note anything that exacerbates or improves her Sx.     Past Medical History  Diagnosis Date  . CAD (coronary artery disease)     stent to RCA  . CVA (cerebral infarction) 2003    no apparent residual  . Hypothyroidism   . PVD (peripheral vascular disease)   . Hyperlipidemia   . Positive PPD     completed rifampin  . Diastolic congestive heart failure   . Hypertension   . COPD (chronic obstructive pulmonary disease) t  . Cancer     clear cell cancer, kidney  . Complication of anesthesia 12/2010    pt is very confused, with AMS with anesthesia  . CVA 07/27/2008    CVA affected cognition and memory per family, no focal deficits.    . ESRD 07/27/2008    ESRD due to  HTN and NSAID's, started hemodialysis in 2005 in [place], [place]. Went to [facility] from 2010 to 2012 and since 2012 has been getting dialysis at [facility] on [address] in [place] on a MWF schedule. First access with RUA AVG placed in [place]. Next and current access was LUA AVG placed by Dr. Wyn Quaker in [place] in or around 2012. Has had 2 or 3 procedures on that graft since placed per family. She gets her access work done here in [place] now. She is allergic to heparin and does not get any heparin at dialysis; she had an allergic reaction apparently when in ICU in the past.      . Encephalopathy   . Seizures   . Dyslipidemia   . Chronic renal insufficiency     On hemodialysis  . Arthritis of shoulder     Bilateral  . Depression    Past Surgical History  Procedure Laterality Date  . Abdominal hysterectomy    . Cholecystectomy    . D&cs    . Right ankle repair    . Left nephrectomy    . Vascular surgery  11/2010    graft inserted to left arm  . Nephrectomy Left     Malignant tumor  . Cataract extraction Bilateral   . Tonsillectomy  Family History  Problem Relation Age of Onset  . Heart disease Father   . Hypertension Mother   . Dementia Mother   . Coronary artery disease Sister   . Heart attack Sister   . Hypertension Brother    History  Substance Use Topics  . Smoking status: Current Every Day Smoker -- 2.00 packs/day for 53 years    Types: Cigarettes  . Smokeless tobacco: Never Used     Comment: 1/2 ppd since age 62. has stopped since hospitalization but wants to go back  . Alcohol Use: Yes   OB History   Grav Para Term Preterm Abortions TAB SAB Ect Mult Living                 Review of Systems  Constitutional: Positive for appetite change. Negative for fever, chills, fatigue and unexpected weight change.  HENT: Positive for rhinorrhea. Negative for congestion, sore throat, sneezing, trouble swallowing, postnasal drip and sinus pressure.   Eyes:  Negative for photophobia.  Respiratory: Positive for cough and shortness of breath (with activity). Negative for chest tightness, wheezing and stridor.   Cardiovascular: Negative for chest pain, palpitations and leg swelling.  Gastrointestinal: Positive for nausea and vomiting. Negative for abdominal pain, diarrhea and constipation.  Endocrine: Negative.   Genitourinary: Negative.   Musculoskeletal: Negative for myalgias and arthralgias.  Neurological: Negative for dizziness, weakness and light-headedness.    Allergies  Iohexol; Ampicillin; Meperidine hcl; Morphine; Penicillins; Valproic acid and related; Heparin; Pentazocine lactate; Phenytoin; Wellbutrin; Ace inhibitors; and Codeine  Home Medications   Current Outpatient Rx  Name  Route  Sig  Dispense  Refill  . acetaminophen (TYLENOL) 500 MG tablet   Oral   Take 500 mg by mouth every 6 (six) hours as needed for pain.         [name] albuterol-ipratropium (COMBIVENT) 18-103 MCG/ACT inhaler   Inhalation   Inhale 2 puffs into the lungs every 6 (six) hours as needed. For shortness of breath         . amLODipine (NORVASC) 10 MG tablet   Oral   Take 10 mg by mouth daily.          [name] aspirin EC 81 MG tablet   Oral   Take 81 mg by mouth daily.         . carvedilol (COREG) 25 MG tablet   Oral   Take 25 mg by mouth 2 (two) times daily with a meal.          . cinacalcet (SENSIPAR) 30 MG tablet   Oral   Take 30 mg by mouth at bedtime.         . diphenhydrAMINE (BENADRYL) 25 mg capsule   Oral   Take 1 capsule (25 mg total) by mouth every 6 (six) hours as needed for itching.   30 capsule   0   . diphenhydrAMINE (BENADRYL) 50 MG capsule   Oral   Take 50 mg by mouth as directed. Benadryl 50 mg by mouth 1 hr prior to CT.  Dispense two 25 mg caps.         . Fluticasone-Salmeterol (ADVAIR) 100-50 MCG/DOSE AEPB   Inhalation   Inhale 1 puff into the lungs every 12 (twelve) hours.         . hydrALAZINE (APRESOLINE) 50  MG tablet   Oral   Take 1 tablet (50 mg total) by mouth 4 (four) times daily.   120 tablet   1  NOTE THE CHANGE IN DOSAGE   . levETIRAcetam (KEPPRA) 500 MG tablet   Oral   Take 500 mg by mouth daily at 8 pm.         . levothyroxine (SYNTHROID, LEVOTHROID) 88 MCG tablet   Oral   Take 88 mcg by mouth daily.          [name] loperamide (IMODIUM) 2 MG capsule   Oral   Take 2 mg by mouth 4 (four) times daily as needed for diarrhea or loose stools.         [name] NITROSTAT 0.4 MG SL tablet   Sublingual   Place 0.4 mg under the tongue every 5 (five) minutes as needed. For chest pain.         . pravastatin (PRAVACHOL) 40 MG tablet   Oral   Take 40 mg by mouth at bedtime.          . sevelamer (RENAGEL) 800 MG tablet   Oral   Take 1,600-2,800 mg by mouth 3 (three) times daily with meals. Take 4 tablets in the morning, two tablets at noon, and 4 tablets at night         . simethicone (MYLICON) 125 MG chewable tablet   Oral   Chew 125 mg by mouth every 6 (six) hours as needed for flatulence.         . traZODone (DESYREL) 50 MG tablet   Oral   Take 50 mg by mouth at bedtime as needed for sleep.           BP 171/60  Pulse 72  Temp(Src) 98.3 F (36.8 C) (Oral)  Resp 16  SpO2 100% Physical Exam  Constitutional: She is oriented to person, place, and time. Vital signs are normal. She appears well-developed and well-nourished.  Non-toxic appearance. She does not have a sickly appearance. She appears ill. No distress.  HENT:  Head: Normocephalic and atraumatic.  Nose: Nose normal.  Mouth/Throat: Mucous membranes are dry.  Eyes: Conjunctivae and EOM are normal. Pupils are equal, round, and reactive to light.  Neck: Full passive range of motion without pain. No JVD present.  Cardiovascular: Normal rate, regular rhythm, S1 normal, S2 normal, intact distal pulses and normal pulses.   No murmur heard. Pulmonary/Chest: No accessory muscle usage. Not tachypneic. No respiratory  distress. She has no decreased breath sounds. She has wheezes in the right middle field, the right lower field, the left middle field and the left lower field. She has no rhonchi. She has no rales.  Abdominal: Normal appearance and bowel sounds are normal. There is no tenderness.  Musculoskeletal: Normal range of motion.  Neurological: She is alert and oriented to person, place, and time. No cranial nerve deficit.  Skin: She is not diaphoretic.  Psychiatric: She has a normal mood and affect.    ED Course  Procedures (including critical care time)  Date: 09/16/2012  Rate: 66  Rhythm: normal sinus rhythm  QRS Axis: L anterior fascicular block  Intervals: normal  ST/T Wave abnormalities: normal  Conduction Disutrbances: none  Narrative Interpretation: L anterior fascicular block, Previous anterior infarct, unchanged EKG from previous   Old EKG Reviewed: No significant changes noted    Labs Review Labs Reviewed  CBC WITH DIFFERENTIAL - Abnormal; Notable for the following:    Hemoglobin 10.9 (*)    HCT 32.5 (*)    All other components within normal limits  BASIC METABOLIC PANEL - Abnormal; Notable for the following:    Sodium 134 (*)  Chloride 92 (*)    BUN 53 (*)    Creatinine, Ser 11.46 (*)    GFR calc non Af Amer 3 (*)    GFR calc Af Amer 3 (*)    All other components within normal limits   Imaging Review No results found.  MDM   1. Productive cough   2. Nausea and vomiting   3. Dyspnea   4. COPD (chronic obstructive pulmonary disease)   Pt H&P consistent with URI vs possible COPD exacerbation.  Her O2 saturation at rest is 97-99%, she is not tachycardic, and she is generally well appearing.  She appears slightly dry on exam, has minimal inspiratory/expiratory wheezes in the middle/lower lobes B/L w/o accessory muscle use or respiratory distress.  Will get CXR, EKG due to her dyspnea with cough and her significant cardiac history, CBC, BMET, and give her a 500 cc bolus  with IV zofran x 1.    9:42 AM - Labs unchanged from previous, will need to go to dialysis.  Pt feeling much better after her dose of zofran and 500 cc bolus.  Will d/c home with specific instructions on return as well as f/u with her PCP within the next three days.  Delsym for cough, will not tx with steroids at this point and if still having wheezing, Sx of shortness of breath/productive cough at her visit in the next few days, consider adding prednisone burst.    Avyon Herendeen R. Paulina Fusi, DO of [facility] 09/16/2012, 9:57 AM      Briscoe Deutscher, DO 09/16/12 [phone]

## 2012-09-16 NOTE — ED Notes (Signed)
Patient transported to X-ray 

## 2012-09-16 NOTE — ED Notes (Signed)
Pt reports coughing, cold symptoms and sob since Saturday night.

## 2012-09-17 DIAGNOSIS — Z23 Encounter for immunization: Secondary | ICD-10-CM | POA: Diagnosis not present

## 2012-09-17 DIAGNOSIS — D631 Anemia in chronic kidney disease: Secondary | ICD-10-CM | POA: Diagnosis not present

## 2012-09-17 DIAGNOSIS — N186 End stage renal disease: Secondary | ICD-10-CM | POA: Diagnosis not present

## 2012-09-17 DIAGNOSIS — N2581 Secondary hyperparathyroidism of renal origin: Secondary | ICD-10-CM | POA: Diagnosis not present

## 2012-09-17 DIAGNOSIS — D509 Iron deficiency anemia, unspecified: Secondary | ICD-10-CM | POA: Diagnosis not present

## 2012-09-18 DIAGNOSIS — D631 Anemia in chronic kidney disease: Secondary | ICD-10-CM | POA: Diagnosis not present

## 2012-09-18 DIAGNOSIS — N2581 Secondary hyperparathyroidism of renal origin: Secondary | ICD-10-CM | POA: Diagnosis not present

## 2012-09-18 DIAGNOSIS — Z23 Encounter for immunization: Secondary | ICD-10-CM | POA: Diagnosis not present

## 2012-09-18 DIAGNOSIS — N186 End stage renal disease: Secondary | ICD-10-CM | POA: Diagnosis not present

## 2012-09-18 DIAGNOSIS — D509 Iron deficiency anemia, unspecified: Secondary | ICD-10-CM | POA: Diagnosis not present

## 2012-09-19 ENCOUNTER — Emergency Department (HOSPITAL_COMMUNITY): Payer: Medicare Other

## 2012-09-19 ENCOUNTER — Emergency Department (HOSPITAL_COMMUNITY)
Admission: EM | Admit: 2012-09-19 | Discharge: 2012-09-19 | Disposition: A | Discharge status: Home / Self Care | Payer: Medicare Other | Attending: Emergency Medicine | Admitting: Emergency Medicine

## 2012-09-19 DIAGNOSIS — I132 Hypertensive heart and chronic kidney disease with heart failure and with stage 5 chronic kidney disease, or end stage renal disease: Secondary | ICD-10-CM | POA: Diagnosis not present

## 2012-09-19 DIAGNOSIS — F329 Major depressive disorder, single episode, unspecified: Secondary | ICD-10-CM | POA: Diagnosis not present

## 2012-09-19 DIAGNOSIS — IMO0002 Reserved for concepts with insufficient information to code with codable children: Secondary | ICD-10-CM | POA: Diagnosis not present

## 2012-09-19 DIAGNOSIS — J209 Acute bronchitis, unspecified: Secondary | ICD-10-CM | POA: Diagnosis not present

## 2012-09-19 DIAGNOSIS — Z79899 Other long term (current) drug therapy: Secondary | ICD-10-CM | POA: Insufficient documentation

## 2012-09-19 DIAGNOSIS — R079 Chest pain, unspecified: Secondary | ICD-10-CM | POA: Diagnosis not present

## 2012-09-19 DIAGNOSIS — E039 Hypothyroidism, unspecified: Secondary | ICD-10-CM | POA: Insufficient documentation

## 2012-09-19 DIAGNOSIS — I2510 Atherosclerotic heart disease of native coronary artery without angina pectoris: Secondary | ICD-10-CM | POA: Insufficient documentation

## 2012-09-19 DIAGNOSIS — F3289 Other specified depressive episodes: Secondary | ICD-10-CM | POA: Insufficient documentation

## 2012-09-19 DIAGNOSIS — G40909 Epilepsy, unspecified, not intractable, without status epilepticus: Secondary | ICD-10-CM | POA: Diagnosis not present

## 2012-09-19 DIAGNOSIS — R059 Cough, unspecified: Secondary | ICD-10-CM

## 2012-09-19 DIAGNOSIS — R062 Wheezing: Secondary | ICD-10-CM | POA: Insufficient documentation

## 2012-09-19 DIAGNOSIS — R05 Cough: Secondary | ICD-10-CM | POA: Insufficient documentation

## 2012-09-19 DIAGNOSIS — R0602 Shortness of breath: Secondary | ICD-10-CM | POA: Diagnosis not present

## 2012-09-19 DIAGNOSIS — Z880 Allergy status to penicillin: Secondary | ICD-10-CM | POA: Diagnosis not present

## 2012-09-19 DIAGNOSIS — Z7982 Long term (current) use of aspirin: Secondary | ICD-10-CM | POA: Insufficient documentation

## 2012-09-19 DIAGNOSIS — N186 End stage renal disease: Secondary | ICD-10-CM | POA: Diagnosis not present

## 2012-09-19 DIAGNOSIS — F17200 Nicotine dependence, unspecified, uncomplicated: Secondary | ICD-10-CM | POA: Insufficient documentation

## 2012-09-19 DIAGNOSIS — M19019 Primary osteoarthritis, unspecified shoulder: Secondary | ICD-10-CM | POA: Diagnosis not present

## 2012-09-19 DIAGNOSIS — J4489 Other specified chronic obstructive pulmonary disease: Secondary | ICD-10-CM | POA: Insufficient documentation

## 2012-09-19 DIAGNOSIS — I5030 Unspecified diastolic (congestive) heart failure: Secondary | ICD-10-CM | POA: Insufficient documentation

## 2012-09-19 DIAGNOSIS — J40 Bronchitis, not specified as acute or chronic: Secondary | ICD-10-CM

## 2012-09-19 DIAGNOSIS — Z992 Dependence on renal dialysis: Secondary | ICD-10-CM | POA: Diagnosis not present

## 2012-09-19 DIAGNOSIS — I120 Hypertensive chronic kidney disease with stage 5 chronic kidney disease or end stage renal disease: Secondary | ICD-10-CM | POA: Diagnosis not present

## 2012-09-19 DIAGNOSIS — J449 Chronic obstructive pulmonary disease, unspecified: Secondary | ICD-10-CM | POA: Insufficient documentation

## 2012-09-19 DIAGNOSIS — E785 Hyperlipidemia, unspecified: Secondary | ICD-10-CM | POA: Diagnosis not present

## 2012-09-19 DIAGNOSIS — I5033 Acute on chronic diastolic (congestive) heart failure: Secondary | ICD-10-CM | POA: Diagnosis not present

## 2012-09-19 LAB — BASIC METABOLIC PANEL
BUN: 25 mg/dL — ABNORMAL HIGH (ref 6–23)
Chloride: 90 mEq/L — ABNORMAL LOW (ref 96–112)
GFR calc Af Amer: 6 mL/min — ABNORMAL LOW (ref >90)
GFR calc non Af Amer: 5 mL/min — ABNORMAL LOW (ref >90)
Glucose, Bld: 92 mg/dL (ref 70–99)
Potassium: 4 mEq/L (ref 3.5–5.1)
Sodium: 132 mEq/L — ABNORMAL LOW (ref 135–145)

## 2012-09-19 LAB — CBC
HCT: 31.3 % — ABNORMAL LOW (ref 36.0–46.0)
Hemoglobin: 10.3 g/dL — ABNORMAL LOW (ref 12.0–15.0)
MCH: 27.5 pg (ref 26.0–34.0)
MCHC: 32.9 g/dL (ref 30.0–36.0)
MCV: 83.5 fL (ref 78.0–100.0)
Platelets: 158 K/uL (ref 150–400)
RBC: 3.75 MIL/uL — ABNORMAL LOW (ref 3.87–5.11)
RDW: 14.2 % (ref 11.5–15.5)
WBC: 3.7 10*3/uL — ABNORMAL LOW (ref 4.0–10.5)

## 2012-09-19 LAB — BASIC METABOLIC PANEL WITH GFR
CO2: 27 meq/L (ref 19–32)
Calcium: 9.6 mg/dL (ref 8.4–10.5)
Creatinine, Ser: 7.21 mg/dL — ABNORMAL HIGH (ref 0.50–1.10)

## 2012-09-19 MED ORDER — PROMETHAZINE HCL 25 MG PO TABS: 25.0000 mg | ORAL_TABLET | Freq: Four times a day (QID) | ORAL | Status: DC | PRN

## 2012-09-19 MED ORDER — ALBUTEROL (5 MG/ML) CONTINUOUS INHALATION SOLN
10.0000 mg/h | INHALATION_SOLUTION | Freq: Once | RESPIRATORY_TRACT | Status: AC
  Administered 2012-09-19: 10 mg/h via RESPIRATORY_TRACT
  Filled 2012-09-19: qty 20

## 2012-09-19 MED ORDER — PREDNISONE 20 MG PO TABS: 40.0000 mg | ORAL_TABLET | Freq: Every day | ORAL | Status: DC

## 2012-09-19 MED ORDER — AZITHROMYCIN 250 MG PO TABS: 250.0000 mg | ORAL_TABLET | Freq: Every day | ORAL | Status: DC

## 2012-09-19 NOTE — ED Provider Notes (Addendum)
CSN: [id number]     Arrival date & time 09/19/12  1352 History   First MD Initiated Contact with Patient 09/19/12 1353     Chief Complaint  Patient presents with  . Shortness of Breath   (Consider location/radiation/quality/duration/timing/severity/associated sxs/prior Treatment) HPI Comments: Pt with h/o ESRD and COPD, was seen for dyspnea on Monday, had dialysis on Monday, Tue and Wednesday, then today went to PCP for follow up, was given nebs, steroids, abx adn EMS Called to bring pt to the ED due to continued SOB and sig wheezing.  EMS reports diffuse wheezes, pt denies SOB to them, but after initial neb, pt did admit to feeling improved.  No CP, fevers.  Has been coughing, sounds wet but non productive.    Patient is a 67 y.o. female presenting with shortness of breath. The history is provided by the patient, medical records and the EMS personnel.  Shortness of Breath Severity:  Moderate Onset quality:  Gradual Duration:  1 week Timing:  Constant Progression:  Waxing and waning Chronicity:  New Relieved by:  Inhaler Worsened by:  Exertion Associated symptoms: cough and wheezing   Associated symptoms: no abdominal pain, no chest pain and no fever     Past Medical History  Diagnosis Date  . CAD (coronary artery disease)     stent to RCA  . CVA (cerebral infarction) 2003    no apparent residual  . Hypothyroidism   . PVD (peripheral vascular disease)   . Hyperlipidemia   . Positive PPD     completed rifampin  . Diastolic congestive heart failure   . Hypertension   . COPD (chronic obstructive pulmonary disease) t  . Cancer     clear cell cancer, kidney  . Complication of anesthesia 12/2010    pt is very confused, with AMS with anesthesia  . CVA 07/27/2008    CVA affected cognition and memory per family, no focal deficits.    . ESRD 07/27/2008    ESRD due to HTN and NSAID's, started hemodialysis in 2005 in [place], [place]. Went to [facility] from 2010 to 2012 and since  2012 has been getting dialysis at [facility] on [address] in [place] on a MWF schedule. First access with RUA AVG placed in [place]. Next and current access was LUA AVG placed by Dr. Wyn Quaker in [place] in or around 2012. Has had 2 or 3 procedures on that graft since placed per family. She gets her access work done here in [place] now. She is allergic to heparin and does not get any heparin at dialysis; she had an allergic reaction apparently when in ICU in the past.      . Encephalopathy   . Seizures   . Dyslipidemia   . Chronic renal insufficiency     On hemodialysis  . Arthritis of shoulder     Bilateral  . Depression    Past Surgical History  Procedure Laterality Date  . Abdominal hysterectomy    . Cholecystectomy    . D&cs    . Right ankle repair    . Left nephrectomy    . Vascular surgery  11/2010    graft inserted to left arm  . Nephrectomy Left     Malignant tumor  . Cataract extraction Bilateral   . Tonsillectomy     Family History  Problem Relation Age of Onset  . Heart disease Father   . Hypertension Mother   . Dementia Mother   . Coronary artery disease Sister   .  Heart attack Sister   . Hypertension Brother    History  Substance Use Topics  . Smoking status: Current Every Day Smoker -- 2.00 packs/day for 53 years    Types: Cigarettes  . Smokeless tobacco: Never Used     Comment: 1/2 ppd since age 69. has stopped since hospitalization but wants to go back  . Alcohol Use: Yes   OB History   Grav Para Term Preterm Abortions TAB SAB Ect Mult Living                 Review of Systems  Constitutional: Negative for fever.  Respiratory: Positive for cough, shortness of breath and wheezing.   Cardiovascular: Negative for chest pain.  Gastrointestinal: Negative for nausea and abdominal pain.  All other systems reviewed and are negative.    Allergies  Iohexol; Ampicillin; Meperidine hcl; Morphine; Penicillins; Valproic acid and related; Heparin;  Pentazocine lactate; Phenytoin; Wellbutrin; Ace inhibitors; and Codeine  Home Medications   Current Outpatient Rx  Name  Route  Sig  Dispense  Refill  . acetaminophen (TYLENOL) 500 MG tablet   Oral   Take 500 mg by mouth every 6 (six) hours as needed for pain.         [name] albuterol-ipratropium (COMBIVENT) 18-103 MCG/ACT inhaler   Inhalation   Inhale 2 puffs into the lungs every 6 (six) hours as needed. For shortness of breath         . amLODipine (NORVASC) 10 MG tablet   Oral   Take 10 mg by mouth daily.          [name] aspirin EC 81 MG tablet   Oral   Take 81 mg by mouth daily.         . carvedilol (COREG) 25 MG tablet   Oral   Take 25 mg by mouth 2 (two) times daily with a meal.         . cinacalcet (SENSIPAR) 30 MG tablet   Oral   Take 60 mg by mouth at bedtime.          . diphenhydrAMINE (BENADRYL) 25 MG tablet   Oral   Take 75 mg by mouth daily as needed for itching (Takes prior to dialysis).         . Fluticasone-Salmeterol (ADVAIR) 100-50 MCG/DOSE AEPB   Inhalation   Inhale 1 puff into the lungs every 12 (twelve) hours.         . hydrALAZINE (APRESOLINE) 50 MG tablet   Oral   Take 50 mg by mouth 3 (three) times daily.         [name] levETIRAcetam (KEPPRA) 500 MG tablet   Oral   Take 500 mg by mouth daily at 8 pm.         . levothyroxine (SYNTHROID, LEVOTHROID) 88 MCG tablet   Oral   Take 88 mcg by mouth daily before breakfast.         . loperamide (IMODIUM) 2 MG capsule   Oral   Take 2 mg by mouth 4 (four) times daily as needed for diarrhea or loose stools.         [name] NITROSTAT 0.4 MG SL tablet   Sublingual   Place 0.4 mg under the tongue every 5 (five) minutes as needed. For chest pain.         [name] ondansetron (ZOFRAN ODT) 4 MG disintegrating tablet   Oral   Take 1 tablet (4 mg total) by mouth every 8 (eight) hours as needed  for nausea.   40 tablet   0   . pravastatin (PRAVACHOL) 20 MG tablet   Oral   Take 20 mg by mouth daily.          . sevelamer (RENAGEL) 800 MG tablet   Oral   Take 1,600-3,200 mg by mouth See admin instructions. Take 4 tablets in the morning, at lunch, and 4 tablets dinner. Takes 1,600 mg with snacks twice a day.         . traZODone (DESYREL) 50 MG tablet   Oral   Take 50 mg by mouth at bedtime as needed for sleep.           BP 178/70  Resp 17  SpO2 100% Physical Exam  Nursing note and vitals reviewed. Constitutional: She appears well-developed and well-nourished.  HENT:  Head: Normocephalic and atraumatic.  Eyes: EOM are normal.  Neck: Neck supple.  Cardiovascular: Normal rate, regular rhythm and intact distal pulses.   Pulmonary/Chest: Accessory muscle usage present. She is in respiratory distress. She has wheezes. She has rhonchi.  coughing  Abdominal: Soft. There is no tenderness.  Neurological: She is alert.  Skin: Skin is warm and dry.  Psychiatric: She has a normal mood and affect.    ED Course  Procedures (including critical care time) Labs Review Labs Reviewed  CBC - Abnormal; Notable for the following:    WBC 3.7 (*)    RBC 3.75 (*)    Hemoglobin 10.3 (*)    HCT 31.3 (*)    All other components within normal limits  BASIC METABOLIC PANEL - Abnormal; Notable for the following:    Sodium 132 (*)    Chloride 90 (*)    BUN 25 (*)    Creatinine, Ser 7.21 (*)    GFR calc non Af Amer 5 (*)    GFR calc Af Amer 6 (*)    All other components within normal limits   Imaging Review Dg Chest Port 1 View  09/19/2012   *RADIOLOGY REPORT*  Clinical Data: Cough, chest pain, shortness of breath, and chest congestion.  PORTABLE CHEST - 1 VIEW  Comparison: 09/16/2012.  Findings: Heart size and pulmonary vascularity are within normal limits.  The lungs are clear.  No acute osseous abnormality.  IMPRESSION: No acute disease.   Original Report Authenticated By: Francene Boyers, M.D.   Saturation is 100% and I interpret to be adequate   4:05 PM Pt feels improved, less  tachypneic.  Decreased wheezing on exam.  Pt offered, again declines admission.  Will give Rx for abx adn po steroids for home.  Pt is agreeable with plan.  MDM   1. Bronchitis   2. Cough      Pt with sig wheezing on exam, receiving neb upon arrival, will give another 1 hr neb.  Repeat CXR shows no acute process.  Pt has no CP.  Pt has had dialysis and has been compliant.  Pt received steroid and abx per EMS at her PCP office off of [facility], Dr. Allyne Gee.  Will continue to monitor.  Pt reports early on that she doesn't wish to be admitted.      Gavin Pound. Oletta Lamas, MD 09/19/12 1538  Gavin Pound. Mersedes Alber, MD 09/19/12 1606

## 2012-09-19 NOTE — ED Notes (Signed)
RT notified of breathing treatment.

## 2012-09-19 NOTE — ED Notes (Signed)
To ED from PCP via EMS, seen 3d ago for COPD, returns for same from PCP, EMS reports wheezing, Alb neb in progress on arrival - pt sts improvement although she denies SOB prior, PCP gave steroids and abx, cough X2-3d, NAD

## 2012-09-20 DIAGNOSIS — D509 Iron deficiency anemia, unspecified: Secondary | ICD-10-CM | POA: Diagnosis not present

## 2012-09-20 DIAGNOSIS — N2581 Secondary hyperparathyroidism of renal origin: Secondary | ICD-10-CM | POA: Diagnosis not present

## 2012-09-20 DIAGNOSIS — N186 End stage renal disease: Secondary | ICD-10-CM | POA: Diagnosis not present

## 2012-09-20 DIAGNOSIS — Z23 Encounter for immunization: Secondary | ICD-10-CM | POA: Diagnosis not present

## 2012-09-20 DIAGNOSIS — D631 Anemia in chronic kidney disease: Secondary | ICD-10-CM | POA: Diagnosis not present

## 2012-09-23 ENCOUNTER — Inpatient Hospital Stay (HOSPITAL_COMMUNITY)
Admission: EM | Admit: 2012-09-23 | Discharge: 2012-09-26 | DRG: 190 | Disposition: A | Discharge status: Home / Self Care | Payer: Medicare Other | Attending: Internal Medicine | Admitting: Internal Medicine

## 2012-09-23 ENCOUNTER — Emergency Department (HOSPITAL_COMMUNITY): Payer: Medicare Other

## 2012-09-23 ENCOUNTER — Encounter (HOSPITAL_COMMUNITY): Payer: Self-pay

## 2012-09-23 DIAGNOSIS — F329 Major depressive disorder, single episode, unspecified: Secondary | ICD-10-CM | POA: Diagnosis present

## 2012-09-23 DIAGNOSIS — D509 Iron deficiency anemia, unspecified: Secondary | ICD-10-CM | POA: Diagnosis not present

## 2012-09-23 DIAGNOSIS — E785 Hyperlipidemia, unspecified: Secondary | ICD-10-CM | POA: Diagnosis present

## 2012-09-23 DIAGNOSIS — I5032 Chronic diastolic (congestive) heart failure: Secondary | ICD-10-CM | POA: Diagnosis present

## 2012-09-23 DIAGNOSIS — G40909 Epilepsy, unspecified, not intractable, without status epilepticus: Secondary | ICD-10-CM | POA: Diagnosis present

## 2012-09-23 DIAGNOSIS — R059 Cough, unspecified: Secondary | ICD-10-CM | POA: Diagnosis not present

## 2012-09-23 DIAGNOSIS — F3289 Other specified depressive episodes: Secondary | ICD-10-CM | POA: Diagnosis present

## 2012-09-23 DIAGNOSIS — I2510 Atherosclerotic heart disease of native coronary artery without angina pectoris: Secondary | ICD-10-CM | POA: Diagnosis present

## 2012-09-23 DIAGNOSIS — J449 Chronic obstructive pulmonary disease, unspecified: Secondary | ICD-10-CM | POA: Diagnosis not present

## 2012-09-23 DIAGNOSIS — Z992 Dependence on renal dialysis: Secondary | ICD-10-CM

## 2012-09-23 DIAGNOSIS — Z79899 Other long term (current) drug therapy: Secondary | ICD-10-CM

## 2012-09-23 DIAGNOSIS — Z23 Encounter for immunization: Secondary | ICD-10-CM | POA: Diagnosis not present

## 2012-09-23 DIAGNOSIS — N186 End stage renal disease: Secondary | ICD-10-CM | POA: Diagnosis present

## 2012-09-23 DIAGNOSIS — Z9861 Coronary angioplasty status: Secondary | ICD-10-CM | POA: Diagnosis not present

## 2012-09-23 DIAGNOSIS — M899 Disorder of bone, unspecified: Secondary | ICD-10-CM | POA: Diagnosis present

## 2012-09-23 DIAGNOSIS — I509 Heart failure, unspecified: Secondary | ICD-10-CM | POA: Diagnosis present

## 2012-09-23 DIAGNOSIS — Z7982 Long term (current) use of aspirin: Secondary | ICD-10-CM

## 2012-09-23 DIAGNOSIS — B369 Superficial mycosis, unspecified: Secondary | ICD-10-CM | POA: Diagnosis present

## 2012-09-23 DIAGNOSIS — I739 Peripheral vascular disease, unspecified: Secondary | ICD-10-CM | POA: Diagnosis present

## 2012-09-23 DIAGNOSIS — I120 Hypertensive chronic kidney disease with stage 5 chronic kidney disease or end stage renal disease: Secondary | ICD-10-CM | POA: Diagnosis present

## 2012-09-23 DIAGNOSIS — I10 Essential (primary) hypertension: Secondary | ICD-10-CM | POA: Diagnosis present

## 2012-09-23 DIAGNOSIS — R0989 Other specified symptoms and signs involving the circulatory and respiratory systems: Secondary | ICD-10-CM | POA: Diagnosis not present

## 2012-09-23 DIAGNOSIS — J9600 Acute respiratory failure, unspecified whether with hypoxia or hypercapnia: Secondary | ICD-10-CM

## 2012-09-23 DIAGNOSIS — I272 Pulmonary hypertension, unspecified: Secondary | ICD-10-CM

## 2012-09-23 DIAGNOSIS — R569 Unspecified convulsions: Secondary | ICD-10-CM | POA: Diagnosis present

## 2012-09-23 DIAGNOSIS — Z888 Allergy status to other drugs, medicaments and biological substances status: Secondary | ICD-10-CM | POA: Diagnosis not present

## 2012-09-23 DIAGNOSIS — Z8673 Personal history of transient ischemic attack (TIA), and cerebral infarction without residual deficits: Secondary | ICD-10-CM | POA: Diagnosis not present

## 2012-09-23 DIAGNOSIS — J441 Chronic obstructive pulmonary disease with (acute) exacerbation: Secondary | ICD-10-CM | POA: Diagnosis not present

## 2012-09-23 DIAGNOSIS — F17200 Nicotine dependence, unspecified, uncomplicated: Secondary | ICD-10-CM | POA: Diagnosis present

## 2012-09-23 DIAGNOSIS — R05 Cough: Secondary | ICD-10-CM | POA: Diagnosis not present

## 2012-09-23 DIAGNOSIS — N2581 Secondary hyperparathyroidism of renal origin: Secondary | ICD-10-CM | POA: Diagnosis present

## 2012-09-23 DIAGNOSIS — R0602 Shortness of breath: Secondary | ICD-10-CM | POA: Diagnosis not present

## 2012-09-23 DIAGNOSIS — D649 Anemia, unspecified: Secondary | ICD-10-CM | POA: Diagnosis present

## 2012-09-23 DIAGNOSIS — D631 Anemia in chronic kidney disease: Secondary | ICD-10-CM | POA: Diagnosis not present

## 2012-09-23 DIAGNOSIS — E039 Hypothyroidism, unspecified: Secondary | ICD-10-CM | POA: Diagnosis present

## 2012-09-23 DIAGNOSIS — J9601 Acute respiratory failure with hypoxia: Secondary | ICD-10-CM

## 2012-09-23 DIAGNOSIS — Z85528 Personal history of other malignant neoplasm of kidney: Secondary | ICD-10-CM | POA: Diagnosis not present

## 2012-09-23 LAB — CBC WITH DIFFERENTIAL/PLATELET
Basophils Absolute: 0 10*3/uL (ref 0.0–0.1)
Eosinophils Relative: 0 % (ref 0–5)
Lymphocytes Relative: 16 % (ref 12–46)
Lymphs Abs: 1.2 10*3/uL (ref 0.7–4.0)
MCV: 81.3 fL (ref 78.0–100.0)
Neutro Abs: 5.7 10*3/uL (ref 1.7–7.7)
Platelets: 202 10*3/uL (ref 150–400)
RBC: 3.91 MIL/uL (ref 3.87–5.11)
RDW: 13.8 % (ref 11.5–15.5)
WBC: 7.3 10*3/uL (ref 4.0–10.5)

## 2012-09-23 LAB — BASIC METABOLIC PANEL
CO2: 28 mEq/L (ref 19–32)
Chloride: 94 mEq/L — ABNORMAL LOW (ref 96–112)
Glucose, Bld: 86 mg/dL (ref 70–99)
Potassium: 4.2 mEq/L (ref 3.5–5.1)
Sodium: 136 mEq/L (ref 135–145)

## 2012-09-23 LAB — RENAL FUNCTION PANEL
Albumin: 3 g/dL — ABNORMAL LOW (ref 3.5–5.2)
BUN: 72 mg/dL — ABNORMAL HIGH (ref 6–23)
CO2: 23 mEq/L (ref 19–32)
Chloride: 91 mEq/L — ABNORMAL LOW (ref 96–112)
Creatinine, Ser: 10.08 mg/dL — ABNORMAL HIGH (ref 0.50–1.10)
Phosphorus: 4.4 mg/dL (ref 2.3–4.6)

## 2012-09-23 LAB — TROPONIN I: Troponin I: <0.3 ng/mL (ref <0.30)

## 2012-09-23 MED ORDER — HYDROXYZINE HCL 25 MG PO TABS
ORAL_TABLET | ORAL | Status: AC
  Filled 2012-09-23: qty 1

## 2012-09-23 MED ORDER — METHYLPREDNISOLONE SODIUM SUCC 125 MG IJ SOLR
60.0000 mg | Freq: Two times a day (BID) | INTRAMUSCULAR | Status: DC
  Administered 2012-09-23 – 2012-09-25 (×5): 60 mg via INTRAVENOUS
  Filled 2012-09-23 (×6): qty 0.96

## 2012-09-23 MED ORDER — PANTOPRAZOLE SODIUM 40 MG PO TBEC
40.0000 mg | DELAYED_RELEASE_TABLET | Freq: Every day | ORAL | Status: DC
  Administered 2012-09-23 – 2012-09-26 (×4): 40 mg via ORAL
  Filled 2012-09-23 (×4): qty 1

## 2012-09-23 MED ORDER — CARVEDILOL 25 MG PO TABS
25.0000 mg | ORAL_TABLET | Freq: Two times a day (BID) | ORAL | Status: DC
  Administered 2012-09-23 – 2012-09-26 (×5): 25 mg via ORAL
  Filled 2012-09-23 (×9): qty 1

## 2012-09-23 MED ORDER — DIPHENHYDRAMINE HCL 25 MG PO TABS
75.0000 mg | ORAL_TABLET | Freq: Every day | ORAL | Status: DC | PRN
  Administered 2012-09-23 – 2012-09-25 (×2): 75 mg via ORAL
  Filled 2012-09-23 (×2): qty 3

## 2012-09-23 MED ORDER — LABETALOL HCL 5 MG/ML IV SOLN
5.0000 mg | INTRAVENOUS | Status: DC | PRN
  Filled 2012-09-23: qty 4

## 2012-09-23 MED ORDER — ONDANSETRON HCL 4 MG PO TABS: 4.0000 mg | ORAL_TABLET | Freq: Four times a day (QID) | ORAL | Status: DC | PRN

## 2012-09-23 MED ORDER — DARBEPOETIN ALFA-POLYSORBATE 25 MCG/0.42ML IJ SOLN
25.0000 ug | INTRAMUSCULAR | Status: DC
  Administered 2012-09-23: 25 ug via INTRAVENOUS
  Filled 2012-09-23: qty 0.42

## 2012-09-23 MED ORDER — ASPIRIN EC 81 MG PO TBEC
81.0000 mg | DELAYED_RELEASE_TABLET | Freq: Every day | ORAL | Status: DC
  Administered 2012-09-23 – 2012-09-26 (×4): 81 mg via ORAL
  Filled 2012-09-23 (×4): qty 1

## 2012-09-23 MED ORDER — INFLUENZA VAC SPLIT QUAD 0.5 ML IM SUSP
0.5000 mL | INTRAMUSCULAR | Status: AC
  Administered 2012-09-24: 0.5 mL via INTRAMUSCULAR
  Filled 2012-09-23: qty 0.5

## 2012-09-23 MED ORDER — CALCIUM CARBONATE ANTACID 500 MG PO CHEW: 400.0000 mg | CHEWABLE_TABLET | Freq: Once | ORAL | Status: DC

## 2012-09-23 MED ORDER — ALBUTEROL SULFATE (5 MG/ML) 0.5% IN NEBU: 2.5000 mg | INHALATION_SOLUTION | RESPIRATORY_TRACT | Status: AC | PRN

## 2012-09-23 MED ORDER — ACETAMINOPHEN 325 MG PO TABS
650.0000 mg | ORAL_TABLET | Freq: Four times a day (QID) | ORAL | Status: DC | PRN
  Administered 2012-09-25: 325 mg via ORAL

## 2012-09-23 MED ORDER — AMLODIPINE BESYLATE 10 MG PO TABS
10.0000 mg | ORAL_TABLET | Freq: Every day | ORAL | Status: DC
  Administered 2012-09-23 – 2012-09-24 (×2): 10 mg via ORAL
  Filled 2012-09-23 (×4): qty 1

## 2012-09-23 MED ORDER — SIMVASTATIN 20 MG PO TABS
20.0000 mg | ORAL_TABLET | Freq: Every day | ORAL | Status: DC
  Administered 2012-09-23 – 2012-09-25 (×3): 20 mg via ORAL
  Filled 2012-09-23 (×7): qty 1

## 2012-09-23 MED ORDER — ONDANSETRON HCL 4 MG/2ML IJ SOLN: 4.0000 mg | Freq: Four times a day (QID) | INTRAMUSCULAR | Status: DC | PRN

## 2012-09-23 MED ORDER — ACETAMINOPHEN 650 MG RE SUPP: 650.0000 mg | Freq: Four times a day (QID) | RECTAL | Status: DC | PRN

## 2012-09-23 MED ORDER — LANTHANUM CARBONATE 500 MG PO CHEW
1000.0000 mg | CHEWABLE_TABLET | Freq: Three times a day (TID) | ORAL | Status: DC
  Administered 2012-09-23 – 2012-09-25 (×6): 1000 mg via ORAL
  Filled 2012-09-23 (×12): qty 2

## 2012-09-23 MED ORDER — METHYLPREDNISOLONE SODIUM SUCC 125 MG IJ SOLR
60.0000 mg | Freq: Two times a day (BID) | INTRAMUSCULAR | Status: DC
  Filled 2012-09-23 (×3): qty 0.96

## 2012-09-23 MED ORDER — IPRATROPIUM BROMIDE 0.02 % IN SOLN
0.5000 mg | RESPIRATORY_TRACT | Status: DC | PRN
  Filled 2012-09-23: qty 2.5

## 2012-09-23 MED ORDER — CALCIUM CARBONATE ANTACID 500 MG PO CHEW
CHEWABLE_TABLET | ORAL | Status: AC
  Filled 2012-09-23: qty 2

## 2012-09-23 MED ORDER — ALBUTEROL SULFATE (5 MG/ML) 0.5% IN NEBU: 2.5000 mg | INHALATION_SOLUTION | RESPIRATORY_TRACT | Status: DC | PRN

## 2012-09-23 MED ORDER — DARBEPOETIN ALFA-POLYSORBATE 25 MCG/0.42ML IJ SOLN
INTRAMUSCULAR | Status: AC
  Administered 2012-09-23: 25 ug via INTRAVENOUS
  Filled 2012-09-23: qty 0.42

## 2012-09-23 MED ORDER — SODIUM CHLORIDE 0.9 % IV SOLN
62.5000 mg | INTRAVENOUS | Status: DC
  Administered 2012-09-25: 62.5 mg via INTRAVENOUS
  Filled 2012-09-23: qty 5

## 2012-09-23 MED ORDER — CALCIUM CARBONATE ANTACID 500 MG PO CHEW
400.0000 mg | CHEWABLE_TABLET | Freq: Once | ORAL | Status: AC
  Administered 2012-09-23: 400 mg via ORAL

## 2012-09-23 MED ORDER — DOXERCALCIFEROL 4 MCG/2ML IV SOLN: 6.0000 ug | INTRAVENOUS | Status: DC

## 2012-09-23 MED ORDER — DIPHENHYDRAMINE HCL 25 MG PO CAPS
ORAL_CAPSULE | ORAL | Status: AC
  Administered 2012-09-23: 75 mg via ORAL
  Filled 2012-09-23: qty 3

## 2012-09-23 MED ORDER — CINACALCET HCL 30 MG PO TABS
60.0000 mg | ORAL_TABLET | Freq: Every day | ORAL | Status: DC
  Administered 2012-09-23 – 2012-09-25 (×3): 60 mg via ORAL
  Filled 2012-09-23 (×5): qty 2

## 2012-09-23 MED ORDER — NYSTATIN 100000 UNIT/GM EX POWD
Freq: Two times a day (BID) | CUTANEOUS | Status: DC
  Administered 2012-09-23 – 2012-09-25 (×5): via TOPICAL
  Filled 2012-09-23: qty 15

## 2012-09-23 MED ORDER — LEVETIRACETAM 500 MG PO TABS
500.0000 mg | ORAL_TABLET | Freq: Every day | ORAL | Status: DC
  Administered 2012-09-23 – 2012-09-25 (×3): 500 mg via ORAL
  Filled 2012-09-23 (×6): qty 1

## 2012-09-23 MED ORDER — HYDRALAZINE HCL 50 MG PO TABS
50.0000 mg | ORAL_TABLET | Freq: Three times a day (TID) | ORAL | Status: DC
  Administered 2012-09-23 – 2012-09-26 (×8): 50 mg via ORAL
  Filled 2012-09-23 (×14): qty 1

## 2012-09-23 MED ORDER — AZITHROMYCIN 250 MG PO TABS
250.0000 mg | ORAL_TABLET | Freq: Every day | ORAL | Status: AC
  Administered 2012-09-23 – 2012-09-24 (×2): 250 mg via ORAL
  Filled 2012-09-23 (×2): qty 1

## 2012-09-23 MED ORDER — LEVOTHYROXINE SODIUM 88 MCG PO TABS
88.0000 ug | ORAL_TABLET | Freq: Every day | ORAL | Status: DC
  Administered 2012-09-23 – 2012-09-26 (×4): 88 ug via ORAL
  Filled 2012-09-23 (×5): qty 1

## 2012-09-23 MED ORDER — TRAZODONE HCL 50 MG PO TABS
50.0000 mg | ORAL_TABLET | Freq: Every evening | ORAL | Status: DC | PRN
  Administered 2012-09-23 – 2012-09-26 (×3): 50 mg via ORAL
  Filled 2012-09-23 (×3): qty 1

## 2012-09-23 MED ORDER — HYDROXYZINE HCL 25 MG PO TABS
25.0000 mg | ORAL_TABLET | Freq: Three times a day (TID) | ORAL | Status: DC | PRN
  Administered 2012-09-23: 25 mg via ORAL
  Filled 2012-09-23: qty 1

## 2012-09-23 NOTE — ED Notes (Signed)
Ordered renal breakfast tray for patient

## 2012-09-23 NOTE — Consult Note (Signed)
[facility] Renal Consultation Note    Indication for Consultation:  Management of ESRD/hemodialysis; anemia, hypertension/volume and secondary hyperparathyroidism  HPI: [name] is a  67 y.o. female ESRD patient (MWF HD) with past medical history significant for COPD, tobacco use, CAD and chronic diastolic CHF who presented to the ED with complaints of productive cough and shortness of breath for about two weeks. She was seen in the ED on 9/15 and subsequently evaluated by her PCP on 9/18 who prescribed prednisone, nebs and referred her back to the ED. [name] declined admission at that time and was sent home on azithromycin. She returns today with continued SOB and finally agrees to admission for COPD exacerbation. We are consulted to manage her hemodialysis needs while here.  At the time of this encounter, she is resting comfortably in bed, complaining only of carpal tunnel symptoms to her left wrist and rectal itching that she states is due to a yeast infection (relieved by Nystatin as an outpatient) She endorses a productive cough, but her breathing is much better. She is anxious to go to dialysis.   Past Medical History  Diagnosis Date  . CAD (coronary artery disease)     stent to RCA  . CVA (cerebral infarction) 2003    no apparent residual  . Hypothyroidism   . PVD (peripheral vascular disease)   . Hyperlipidemia   . Positive PPD     completed rifampin  . Diastolic congestive heart failure   . Hypertension   . COPD (chronic obstructive pulmonary disease) t  . Cancer     clear cell cancer, kidney  . Complication of anesthesia 12/2010    pt is very confused, with AMS with anesthesia  . CVA 07/27/2008    CVA affected cognition and memory per family, no focal deficits.    . ESRD 07/27/2008    ESRD due to HTN and NSAID's, started hemodialysis in 2005 in [place], [place]. Went to [facility] from 2010 to 2012 and since 2012 has been getting dialysis at [facility] on [address] in [place] on a MWF schedule. First access with RUA AVG placed in [place]. Next and current access was LUA AVG placed by Dr. Wyn Quaker in [place] in or around 2012. Has had 2 or 3 procedures on that graft since placed per family. She gets her access work done here in [place] now. She is allergic to heparin and does not get any heparin at dialysis; she had an allergic reaction apparently when in ICU in the past.      . Encephalopathy   . Seizures   . Dyslipidemia   . Chronic renal insufficiency     On hemodialysis  . Arthritis of shoulder     Bilateral  . Depression    Past Surgical History  Procedure Laterality Date  . Abdominal hysterectomy    . Cholecystectomy    . D&cs    . Right ankle repair    . Left nephrectomy    . Vascular surgery  11/2010    graft inserted to left arm  . Nephrectomy Left     Malignant tumor  . Cataract extraction Bilateral   . Tonsillectomy     Family History  Problem Relation Age of Onset  . Heart disease Father   . Hypertension Mother   . Dementia Mother   . Coronary artery disease Sister   . Heart attack Sister   . Hypertension Brother    Social History:  reports that  she has been smoking Cigarettes.  She has a 106 pack-year smoking history. She has never used smokeless tobacco. She reports that  drinks alcohol. She reports that she does not use illicit drugs. Allergies  Allergen Reactions  . Iohexol Swelling and Other (See Comments)    1970s; passed out and had facial/tongue swelling.  Requires 13-hour prep with prednisone and benadryl  . Ampicillin Hives and Rash  . Meperidine Hcl Swelling and Rash    Makes tongue swell  . Morphine Rash  . Penicillins Rash  . Valproic Acid And Related Other (See Comments)    Confusion   . Heparin     MDs told her not to take after reaction in ICU  . Pentazocine Lactate     Patient does not remember reaction to this med (Talwin).   . Phenytoin Other (See Comments)    Had  reaction while in ICU; doesn't know.  MDs told her not to take ever again.  . Wellbutrin [Bupropion]     seizures  . Ace Inhibitors Rash   Prior to Admission medications   Medication Sig Start Date End Date Taking? Authorizing Provider  acetaminophen (TYLENOL) 500 MG tablet Take 500 mg by mouth every 6 (six) hours as needed for pain.   Yes Historical Provider, MD  amLODipine (NORVASC) 10 MG tablet Take 10 mg by mouth daily.    Yes Historical Provider, MD  aspirin EC 81 MG tablet Take 81 mg by mouth daily.   Yes Historical Provider, MD  carvedilol (COREG) 25 MG tablet Take 25 mg by mouth 2 (two) times daily with a meal.   Yes Historical Provider, MD  cinacalcet (SENSIPAR) 30 MG tablet Take 60 mg by mouth at bedtime.    Yes Historical Provider, MD  hydrALAZINE (APRESOLINE) 50 MG tablet Take 50 mg by mouth 3 (three) times daily.   Yes Historical Provider, MD  levETIRAcetam (KEPPRA) 500 MG tablet Take 500 mg by mouth daily at 8 pm.   Yes Historical Provider, MD  levothyroxine (SYNTHROID, LEVOTHROID) 88 MCG tablet Take 88 mcg by mouth daily before breakfast.   Yes Historical Provider, MD  loperamide (IMODIUM) 2 MG capsule Take 2 mg by mouth 4 (four) times daily as needed for diarrhea or loose stools.   Yes Historical Provider, MD  NITROSTAT 0.4 MG SL tablet Place 0.4 mg under the tongue every 5 (five) minutes as needed. For chest pain. 09/16/10  Yes Historical Provider, MD  pravastatin (PRAVACHOL) 20 MG tablet Take 20 mg by mouth daily.   Yes Historical Provider, MD  predniSONE (DELTASONE) 20 MG tablet Take 2 tablets (40 mg total) by mouth daily. 09/19/12  Yes Sarah Pound. Ghim, MD  sevelamer (RENAGEL) 800 MG tablet Take 1,600-3,200 mg by mouth See admin instructions. Take 4 tablets in the morning, at lunch, and 4 tablets dinner. Takes 1,600 mg with snacks twice a day.   Yes Historical Provider, MD  traZODone (DESYREL) 50 MG tablet Take 50 mg by mouth at bedtime as needed for sleep.    Yes Historical  Provider, MD  albuterol-ipratropium (COMBIVENT) 18-103 MCG/ACT inhaler Inhale 2 puffs into the lungs every 6 (six) hours as needed. For shortness of breath    Historical Provider, MD  azithromycin (ZITHROMAX) 250 MG tablet Take 1 tablet (250 mg total) by mouth daily. Take 2 tablets by mouth on day 1, then 1 tablet by mouth daily on days 2-5 09/19/12   Sarah Pound. Ghim, MD  diphenhydrAMINE (BENADRYL) 25 MG tablet Take  75 mg by mouth daily as needed for itching (Takes prior to dialysis).    Historical Provider, MD  ondansetron (ZOFRAN ODT) 4 MG disintegrating tablet Take 1 tablet (4 mg total) by mouth every 8 (eight) hours as needed for nausea. 09/16/12   Twana First Hess, DO  promethazine (PHENERGAN) 25 MG tablet Take 1 tablet (25 mg total) by mouth every 6 (six) hours as needed for nausea. 09/19/12   Sarah Pound. Ghim, MD   Current Facility-Administered Medications  Medication Dose Route Frequency Provider Last Rate Last Dose  . acetaminophen (TYLENOL) tablet 650 mg  650 mg Oral Q6H PRN Jerald Kief, MD       Or  . acetaminophen (TYLENOL) suppository 650 mg  650 mg Rectal Q6H PRN Jerald Kief, MD      . albuterol (PROVENTIL) (5 MG/ML) 0.5% nebulizer solution 2.5 mg  2.5 mg Nebulization Q4H PRN Olivia Mackie, MD      . ipratropium (ATROVENT) nebulizer solution 0.5 mg  0.5 mg Nebulization Q4H PRN Jerald Kief, MD       And  . albuterol (PROVENTIL) (5 MG/ML) 0.5% nebulizer solution 2.5 mg  2.5 mg Nebulization Q4H PRN Jerald Kief, MD      . amLODipine (NORVASC) tablet 10 mg  10 mg Oral Daily Jerald Kief, MD   10 mg at 09/23/12 1115  . aspirin EC tablet 81 mg  81 mg Oral Daily Jerald Kief, MD   81 mg at 09/23/12 1115  . azithromycin (ZITHROMAX) tablet 250 mg  250 mg Oral Daily Jerald Kief, MD   250 mg at 09/23/12 1057  . carvedilol (COREG) tablet 25 mg  25 mg Oral BID WC Jerald Kief, MD      . cinacalcet [facility]) tablet 60 mg  60 mg Oral QHS Jerald Kief, MD      . darbepoetin [facility])  injection 25 mcg  25 mcg Intravenous Q Mon-HD Kerin Salen, PA-C      . diphenhydrAMINE (BENADRYL) tablet 75 mg  75 mg Oral Daily PRN Jerald Kief, MD   75 mg at 09/23/12 1529  . [START ON 09/25/2012] ferric gluconate (NULECIT) 62.5 mg in sodium chloride 0.9 % 100 mL IVPB  62.5 mg Intravenous Q Wed-HD Kerin Salen, PA-C      . hydrALAZINE (APRESOLINE) tablet 50 mg  50 mg Oral TID Jerald Kief, MD   50 mg at 09/23/12 1114  . [START ON 09/24/2012] influenza vac split quadrivalent PF (FLUARIX) injection 0.5 mL  0.5 mL Intramuscular Tomorrow-1000 Hillary Bow, DO      . labetalol (NORMODYNE,TRANDATE) injection 5 mg  5 mg Intravenous Q10 min PRN Jerald Kief, MD      . lanthanum [facility]) chewable tablet 1,000 mg  1,000 mg Oral TID WC Kerin Salen, PA-C      . levETIRAcetam (KEPPRA) tablet 500 mg  500 mg Oral Q2000 Jerald Kief, MD      . Melene Muller ON 09/24/2012] levothyroxine (SYNTHROID, LEVOTHROID) tablet 88 mcg  88 mcg Oral QAC breakfast Jerald Kief, MD   88 mcg at 09/23/12 1116  . methylPREDNISolone sodium succinate (SOLU-MEDROL) 125 mg/2 mL injection 60 mg  60 mg Intravenous BID Hillary Bow, DO   60 mg at 09/23/12 1325  . ondansetron (ZOFRAN) tablet 4 mg  4 mg Oral Q6H PRN Jerald Kief, MD       Or  . ondansetron [facility])  injection 4 mg  4 mg Intravenous Q6H PRN Jerald Kief, MD      . pantoprazole (PROTONIX) EC tablet 40 mg  40 mg Oral Daily Jerald Kief, MD   40 mg at 09/23/12 1154  . simvastatin (ZOCOR) tablet 20 mg  20 mg Oral QHS Jerald Kief, MD      . traZODone (DESYREL) tablet 50 mg  50 mg Oral QHS PRN Jerald Kief, MD       Labs: Basic Metabolic Panel:  Recent Labs Lab 09/19/12 1444 09/23/12 0450  NA 132* 136  K 4.0 4.2  CL 90* 94*  CO2 27 28  GLUCOSE 92 86  BUN 25* 63*  CREATININE 7.21* 9.42*  CALCIUM 9.6 10.3   CBC:  Recent Labs Lab 09/19/12 1444 09/23/12 0450  WBC 3.7* 7.3  NEUTROABS  --  5.7  HGB 10.3* 10.9*  HCT 31.3* 31.8*  MCV  83.5 81.3  PLT 158 202   Cardiac Enzymes:  Recent Labs Lab 09/23/12 0450  TROPONINI <0.30   Studies/Results: Dg Chest 2 View  09/23/2012   CLINICAL DATA:  Cough and congestion. COPD exacerbation.  EXAM: CHEST  2 VIEW  COMPARISON:  PA and lateral chest 09/16/2012 and 09/22/2010.  FINDINGS: Heart size and mediastinal contours are within normal limits. Both lungs are clear. Visualized skeletal structures are unremarkable.  IMPRESSION: No active cardiopulmonary disease.   Electronically Signed   By: Drusilla Kanner M.D.   On: 09/23/2012 04:47    ROS: Productive cough, L carpal tunnel pain, rectal itching. 10 pt ROS asked and answered. All other systems negative except as above.  Physical Exam: Filed Vitals:   09/23/12 1245 09/23/12 1530 09/23/12 1551 09/23/12 1556  BP: 184/76 161/68 147/68 147/68  Pulse: 70 69 70 70  Temp: 98.2 F (36.8 C) 98.1 F (36.7 C)    TempSrc: Oral Oral    Resp:  17 18 18   Height:      Weight:  71.8 kg (158 lb 4.6 oz)    SpO2: 99% 97% 96%      General: Well developed, well nourished, in no acute distress. Head: Normocephalic, atraumatic, sclera non-icteric, mucus membranes are moist Neck: Supple. Trace JVD elevation Lungs: Faint crackles at bases. No wheezes, rales, or rhonchi appreciated. Breathing is unlabored. Heart: RRR with S1 S2. No murmurs, rubs, or gallops appreciated. Abdomen: Soft, non-tender, non-distended with normoactive bowel sounds. No rebound/guarding. No obvious abdominal masses. M-S:  Strength and tone appear normal for age. Lower extremities:without edema or ischemic changes, no open wounds  Neuro: Alert and oriented X 3. Moves all extremities spontaneously. Psych:  Responds to questions appropriately with a normal affect. Skin: Resolving fungal infection per rectum Dialysis Access: LUA Hero + bruit  Dialysis Orders: [facility]  on MWF . EDW 67.5 kg HD Bath 2K/2Ca  Time 4 hrs Heparin None (due to allergy) Access LUA AVG BFR 350  DFR A1.5    Hectorol 6 mcg IV/HD Epogen 2000   Units IV/HD  Venofer  50 mg  Recent labs: Hgb 10.7 < 11.4 < 11.6, P 7.5 on Renvela 4-5 ac, PTH 440 on 8/27, Tsat 33%  Assessment/Plan: 1. COPD exacerbation w/ failed op mgmt - Per admit. PRN nebs, IV steroids and an additional 2 days of azithromycin to complete 7 days. CXR clear. 2. ESRD -  Continue MWF K+ 4.2, No heparin d/t allergy 3. Hypertension/volume  - SBPs 140s-180s on home Coreg 25 mg BID, Norvasc 10, Hydralazine TID  plus PRN labetolol here. Standing wgt with HD. Up only about 0.5kg as outpatient. Try for UF 2.5L 4. Anemia  - Hgb 10.9, trending down op on Epo 2000 u. Aranesp 25 ordered here q Mondays. Weekly Fe q Wed. 5. Metabolic bone disease -  Ca 10.2 (11.1 corrected). Low Ca bath. Hold hectorol 6 mcg for hypercalcemia. Last Phos 7.5 -  poorly controlled for some time on op  Renvela - pt states taking 4-5 ac. Previously has not wanted to try a different agent because she has a large supply of renvela at home. Will try Fosrenol while here. New rx at increased dose if tolerated. Last PTH 440. Continue Sensipar 60. Renal panel with HD.  6. Nutrition - alb 3.0. Renal diet, multivitamin 7. Hypothyroidism - on synthroid 8. CAD - on ASA 9. Chronic diastolic CHF - compensated per admit 10. Hx seizures - on Keppra 11. Rectal itching - Reported fungal infection appears to be resolving. Will honor pt request for nystatin powder.  Claud Kelp, PA-C [facility] Pager [phone] 09/23/2012, 4:03 PM   Patient seen and examined.  Agree with assessment and plan as above. Vinson Moselle  MD Pager [phone]    Cell  [phone] 09/23/2012, 5:23 PM

## 2012-09-23 NOTE — ED Notes (Signed)
Questioned previous nurse about giving solumederol. RN states medication is not due until 3 pm this afternoon due to order written as every 12 hours and pt received medication in the EMS at 3am.

## 2012-09-23 NOTE — H&P (Signed)
[facility] History and Physical  Nicholl Bradley ZOX:[id number] DOB: April 19, 1945 DOA: 09/23/2012  Referring physician: Emergency Department PCP: [name], MD  Specialists:   Chief Complaint: SOB  HPI: [name] is a 67 y.o. female  With a hx of ESRD on MWF HD, CAD, chronic diastolic CHF, and seizures who initially presented to the ED on 9/15 with complaints of SOB and cough. She was given a cough suppressant and discharged home. The patient continued with sob and wheezing and saw her PCP on 9/18. She was prescribed prednisone and given a breathing treatment and later referred to the ED given no improvement. In the ED, the patient was given IV steroids and neb treatments. The patient went home with azithromycin against recommendations for admission. The patient returns today with continued sob. She again was given a course of IV steroids with notable improvement. The hosptiatlist was consulted for possible admission.  Review of Systems:  Per above, remainder of 10pt ros reviewed and are neg  Past Medical History  Diagnosis Date  . CAD (coronary artery disease)     stent to RCA  . CVA (cerebral infarction) 2003    no apparent residual  . Hypothyroidism   . PVD (peripheral vascular disease)   . Hyperlipidemia   . Positive PPD     completed rifampin  . Diastolic congestive heart failure   . Hypertension   . COPD (chronic obstructive pulmonary disease) t  . Cancer     clear cell cancer, kidney  . Complication of anesthesia 12/2010    pt is very confused, with AMS with anesthesia  . CVA 07/27/2008    CVA affected cognition and memory per family, no focal deficits.    . ESRD 07/27/2008    ESRD due to HTN and NSAID's, started hemodialysis in 2005 in [place], [place]. Went to [facility] from 2010 to 2012 and since 2012 has been getting dialysis at [facility] on [address] in [place] on a MWF schedule. First access with RUA AVG placed in [place]. Next and current  access was LUA AVG placed by Dr. Wyn Quaker in [place] in or around 2012. Has had 2 or 3 procedures on that graft since placed per family. She gets her access work done here in [place] now. She is allergic to heparin and does not get any heparin at dialysis; she had an allergic reaction apparently when in ICU in the past.      . Encephalopathy   . Seizures   . Dyslipidemia   . Chronic renal insufficiency     On hemodialysis  . Arthritis of shoulder     Bilateral  . Depression    Past Surgical History  Procedure Laterality Date  . Abdominal hysterectomy    . Cholecystectomy    . D&cs    . Right ankle repair    . Left nephrectomy    . Vascular surgery  11/2010    graft inserted to left arm  . Nephrectomy Left     Malignant tumor  . Cataract extraction Bilateral   . Tonsillectomy     Social History:  reports that she has been smoking Cigarettes.  She has a 106 pack-year smoking history. She has never used smokeless tobacco. She reports that  drinks alcohol. She reports that she does not use illicit drugs.  where does patient live--home, ALF, SNF? and with whom if at home?  Can patient participate in ADLs?  Allergies  Allergen Reactions  . Iohexol Swelling and Other (See  Comments)    1970s; passed out and had facial/tongue swelling.  Requires 13-hour prep with prednisone and benadryl  . Ampicillin Hives and Rash  . Meperidine Hcl Swelling and Rash    Makes tongue swell  . Morphine Rash  . Penicillins Rash  . Valproic Acid And Related Other (See Comments)    Confusion   . Heparin     MDs told her not to take after reaction in ICU  . Pentazocine Lactate     Patient does not remember reaction to this med (Talwin).   . Phenytoin Other (See Comments)    Had reaction while in ICU; doesn't know.  MDs told her not to take ever again.  . Wellbutrin [Bupropion]     seizures  . Ace Inhibitors Rash    Family History  Problem Relation Age of Onset  . Heart disease Father   .  Hypertension Mother   . Dementia Mother   . Coronary artery disease Sister   . Heart attack Sister   . Hypertension Brother     (be sure to complete)  Prior to Admission medications   Medication Sig Start Date End Date Taking? Authorizing Provider  acetaminophen (TYLENOL) 500 MG tablet Take 500 mg by mouth every 6 (six) hours as needed for pain.   Yes Historical Provider, MD  amLODipine (NORVASC) 10 MG tablet Take 10 mg by mouth daily.    Yes Historical Provider, MD  aspirin EC 81 MG tablet Take 81 mg by mouth daily.   Yes Historical Provider, MD  carvedilol (COREG) 25 MG tablet Take 25 mg by mouth 2 (two) times daily with a meal.   Yes Historical Provider, MD  cinacalcet (SENSIPAR) 30 MG tablet Take 60 mg by mouth at bedtime.    Yes Historical Provider, MD  hydrALAZINE (APRESOLINE) 50 MG tablet Take 50 mg by mouth 3 (three) times daily.   Yes Historical Provider, MD  levETIRAcetam (KEPPRA) 500 MG tablet Take 500 mg by mouth daily at 8 pm.   Yes Historical Provider, MD  levothyroxine (SYNTHROID, LEVOTHROID) 88 MCG tablet Take 88 mcg by mouth daily before breakfast.   Yes Historical Provider, MD  loperamide (IMODIUM) 2 MG capsule Take 2 mg by mouth 4 (four) times daily as needed for diarrhea or loose stools.   Yes Historical Provider, MD  NITROSTAT 0.4 MG SL tablet Place 0.4 mg under the tongue every 5 (five) minutes as needed. For chest pain. 09/16/10  Yes Historical Provider, MD  pravastatin (PRAVACHOL) 20 MG tablet Take 20 mg by mouth daily.   Yes Historical Provider, MD  predniSONE (DELTASONE) 20 MG tablet Take 2 tablets (40 mg total) by mouth daily. 09/19/12  Yes Gavin Pound. Ghim, MD  sevelamer (RENAGEL) 800 MG tablet Take 1,600-3,200 mg by mouth See admin instructions. Take 4 tablets in the morning, at lunch, and 4 tablets dinner. Takes 1,600 mg with snacks twice a day.   Yes Historical Provider, MD  traZODone (DESYREL) 50 MG tablet Take 50 mg by mouth at bedtime as needed for sleep.    Yes  Historical Provider, MD  albuterol-ipratropium (COMBIVENT) 18-103 MCG/ACT inhaler Inhale 2 puffs into the lungs every 6 (six) hours as needed. For shortness of breath    Historical Provider, MD  azithromycin (ZITHROMAX) 250 MG tablet Take 1 tablet (250 mg total) by mouth daily. Take 2 tablets by mouth on day 1, then 1 tablet by mouth daily on days 2-5 09/19/12   Gavin Pound. Oletta Lamas, MD  diphenhydrAMINE (BENADRYL) 25 MG tablet Take 75 mg by mouth daily as needed for itching (Takes prior to dialysis).    Historical Provider, MD  ondansetron (ZOFRAN ODT) 4 MG disintegrating tablet Take 1 tablet (4 mg total) by mouth every 8 (eight) hours as needed for nausea. 09/16/12   Twana First Hess, DO  promethazine (PHENERGAN) 25 MG tablet Take 1 tablet (25 mg total) by mouth every 6 (six) hours as needed for nausea. 09/19/12   Gavin Pound. Ghim, MD   Physical Exam: Filed Vitals:   09/23/12 0354 09/23/12 0400 09/23/12 0608 09/23/12 0630  BP: 181/75 197/67 189/73 172/59  Pulse: 64 64 73 70  Temp: 98.4 F (36.9 C)     TempSrc: Oral     Resp: 16  12 14   Height: 5\' 2"  (1.575 m)     Weight: 68.04 kg (150 lb)     SpO2: 97% 99% 98% 95%     General:  Awake, in nad  Eyes: PERRL B  ENT: membranes moist, dentition fair  Neck: trachea midline, neck, supple  Cardiovascular: regular, s1, s2  Respiratory: normal resp effort, no wheezing  Abdomen: soft, nondistneded  Skin: normal skin turgor, no abnormal skin lesions seen  Musculoskeletal: perfused, no clubbing or edema  Psychiatric: mood/affect normal // no auditory/visual hallucinations  Neurologic: cn2-12 grossly intact, strength/sensation intact  Labs on Admission:  Basic Metabolic Panel:  Recent Labs Lab 09/19/12 1444 09/23/12 0450  NA 132* 136  Bradley 4.0 4.2  CL 90* 94*  CO2 27 28  GLUCOSE 92 86  BUN 25* 63*  CREATININE 7.21* 9.42*  CALCIUM 9.6 10.3   Liver Function Tests: No results found for this basename: AST, ALT, ALKPHOS, BILITOT, PROT,  ALBUMIN,  in the last 168 hours No results found for this basename: LIPASE, AMYLASE,  in the last 168 hours No results found for this basename: AMMONIA,  in the last 168 hours CBC:  Recent Labs Lab 09/19/12 1444 09/23/12 0450  WBC 3.7* 7.3  NEUTROABS  --  5.7  HGB 10.3* 10.9*  HCT 31.3* 31.8*  MCV 83.5 81.3  PLT 158 202   Cardiac Enzymes:  Recent Labs Lab 09/23/12 0450  TROPONINI <0.30    BNP (last 3 results)  Recent Labs  09/23/12 0450  PROBNP 10221.0*   CBG: No results found for this basename: GLUCAP,  in the last 168 hours  Radiological Exams on Admission: Dg Chest 2 View  09/23/2012   CLINICAL DATA:  Cough and congestion. COPD exacerbation.  EXAM: CHEST  2 VIEW  COMPARISON:  PA and lateral chest 09/16/2012 and 09/22/2010.  FINDINGS: Heart size and mediastinal contours are within normal limits. Both lungs are clear. Visualized skeletal structures are unremarkable.  IMPRESSION: No active cardiopulmonary disease.   Electronically Signed   By: Drusilla Kanner M.D.   On: 09/23/2012 04:47     Assessment/Plan Principal Problem:   COPD exacerbation Active Problems:   Essential hypertension, malignant   CAD   Chronic diastolic heart failure   ESRD   Seizure   1. COPD exacerbation 1. Will continue with PRN duonebs 2. Will continue on scheduled IV steroids with plans to titrate as tolerate 3. Cont O2 per protocol 4. Will admit to med floor 5. The patient has completed 5 days of empiric azithromycin, will give an additional 2 days of abx to complete 7 days of abx. Afebrile. No signs of pneumonia on CXR 2. HTN 1. Suboptimally controlled 2. Cont home meds for now 3.  Will add PRN IV labetalol 3. CAD 1. Cont asa per home regimen 2. Asymptomatic 4. Chronic diastolic CHF 1. Compensated. No signs of vol overload 5. ESRD 1. On MWF HD 2. Will consult Nephrology 6. Hx seizures 1. Will cont home AED for now 2. Stable 7. DVT prophylaxis 1. Documented allergy to  heparin products 2. SCD's for now   Code Status: Full (must indicate code status--if unknown or must be presumed, indicate so) Family Communication: Pt in room (indicate person spoken with, if applicable, with phone number if by telephone) Disposition Plan: Pending (indicate anticipated LOS)  Time spent:  [name] [facility] Pager [phone]  If 7PM-7AM, please contact night-coverage [email or web address] Password [facility] 09/23/2012, 8:11 AM

## 2012-09-23 NOTE — ED Notes (Signed)
Patient presents to ED via [facility]. Pt c/o of increasing shortness of breath. Pt states that she was seen here a few days ago for hx of same and was given "unknown prescriptions" and discharged. Pt states that she was only able to afford 1 of the prescribed meds. Upon EMS arrival patient was found to be "hyperventilating." In route EMS gave pt 10 of Albuterol, 0.5 of Atrovent, and 125 of Solumedrol. Per EMS pt has rhonchi throughout all lobes. Initial bp was 198/86. A&Ox4 upon arrival to ED. O2 sat 97% on RA.

## 2012-09-23 NOTE — Procedures (Signed)
I was present at this dialysis session. I have reviewed the session itself and made appropriate changes.   Vinson Moselle  MD Pager [phone]    Cell  [phone] 09/23/2012, 5:24 PM

## 2012-09-23 NOTE — ED Provider Notes (Signed)
CSN: [id number]     Arrival date & time 09/23/12  2951 History   First MD Initiated Contact with Patient 09/23/12 0344     Chief Complaint  Patient presents with  . Shortness of Breath   (Consider location/radiation/quality/duration/timing/severity/associated sxs/prior Treatment) HPI 67 yo female presents to the ER from home via EMS with complaint of sob, cough.  Pt with h/o COPD, CAD, CHF, ESRD on dialysis MWF.  She has had 2 weeks of sob, cough.  She was seen in the ER on 9/15 and 9/20 for wheezing/sob, refused admission.  Seen by pcm and sent to the ER on 9/20.  She has been using her neb machine, taking steroids without improvement in symptoms.  EMS gave 2 neb tx and solumedrol.  No chest pain, no fevers.  Coughing up yellow sputum.  Quit smoking 2 weeks ago.  She has not missed any dialysis.    Past Medical History  Diagnosis Date  . CAD (coronary artery disease)     stent to RCA  . CVA (cerebral infarction) 2003    no apparent residual  . Hypothyroidism   . PVD (peripheral vascular disease)   . Hyperlipidemia   . Positive PPD     completed rifampin  . Diastolic congestive heart failure   . Hypertension   . COPD (chronic obstructive pulmonary disease) t  . Cancer     clear cell cancer, kidney  . Complication of anesthesia 12/2010    pt is very confused, with AMS with anesthesia  . CVA 07/27/2008    CVA affected cognition and memory per family, no focal deficits.    . ESRD 07/27/2008    ESRD due to HTN and NSAID's, started hemodialysis in 2005 in [place], [place]. Went to [facility] from 2010 to 2012 and since 2012 has been getting dialysis at [facility] on [address] in [place] on a MWF schedule. First access with RUA AVG placed in [place]. Next and current access was LUA AVG placed by Dr. Wyn Quaker in [place] in or around 2012. Has had 2 or 3 procedures on that graft since placed per family. She gets her access work done here in [place] now. She is allergic to heparin  and does not get any heparin at dialysis; she had an allergic reaction apparently when in ICU in the past.      . Encephalopathy   . Seizures   . Dyslipidemia   . Chronic renal insufficiency     On hemodialysis  . Arthritis of shoulder     Bilateral  . Depression    Past Surgical History  Procedure Laterality Date  . Abdominal hysterectomy    . Cholecystectomy    . D&cs    . Right ankle repair    . Left nephrectomy    . Vascular surgery  11/2010    graft inserted to left arm  . Nephrectomy Left     Malignant tumor  . Cataract extraction Bilateral   . Tonsillectomy     Family History  Problem Relation Age of Onset  . Heart disease Father   . Hypertension Mother   . Dementia Mother   . Coronary artery disease Sister   . Heart attack Sister   . Hypertension Brother    History  Substance Use Topics  . Smoking status: Current Every Day Smoker -- 2.00 packs/day for 53 years    Types: Cigarettes  . Smokeless tobacco: Never Used     Comment: 1/2 ppd since age 77.  has stopped since hospitalization but wants to go back  . Alcohol Use: Yes   OB History   Grav Para Term Preterm Abortions TAB SAB Ect Mult Living                 Review of Systems  All other systems reviewed and are negative.    Allergies  Iohexol; Ampicillin; Meperidine hcl; Morphine; Penicillins; Valproic acid and related; Heparin; Pentazocine lactate; Phenytoin; Wellbutrin; and Ace inhibitors  Home Medications   Current Outpatient Rx  Name  Route  Sig  Dispense  Refill  . acetaminophen (TYLENOL) 500 MG tablet   Oral   Take 500 mg by mouth every 6 (six) hours as needed for pain.         [name] amLODipine (NORVASC) 10 MG tablet   Oral   Take 10 mg by mouth daily.          [name] aspirin EC 81 MG tablet   Oral   Take 81 mg by mouth daily.         . carvedilol (COREG) 25 MG tablet   Oral   Take 25 mg by mouth 2 (two) times daily with a meal.         . cinacalcet (SENSIPAR) 30 MG tablet    Oral   Take 60 mg by mouth at bedtime.          . hydrALAZINE (APRESOLINE) 50 MG tablet   Oral   Take 50 mg by mouth 3 (three) times daily.         [name] levETIRAcetam (KEPPRA) 500 MG tablet   Oral   Take 500 mg by mouth daily at 8 pm.         . levothyroxine (SYNTHROID, LEVOTHROID) 88 MCG tablet   Oral   Take 88 mcg by mouth daily before breakfast.         . loperamide (IMODIUM) 2 MG capsule   Oral   Take 2 mg by mouth 4 (four) times daily as needed for diarrhea or loose stools.         [name] NITROSTAT 0.4 MG SL tablet   Sublingual   Place 0.4 mg under the tongue every 5 (five) minutes as needed. For chest pain.         . pravastatin (PRAVACHOL) 20 MG tablet   Oral   Take 20 mg by mouth daily.         . predniSONE (DELTASONE) 20 MG tablet   Oral   Take 2 tablets (40 mg total) by mouth daily.   12 tablet   0   . sevelamer (RENAGEL) 800 MG tablet   Oral   Take 1,600-3,200 mg by mouth See admin instructions. Take 4 tablets in the morning, at lunch, and 4 tablets dinner. Takes 1,600 mg with snacks twice a day.         . traZODone (DESYREL) 50 MG tablet   Oral   Take 50 mg by mouth at bedtime as needed for sleep.          [name] albuterol-ipratropium (COMBIVENT) 18-103 MCG/ACT inhaler   Inhalation   Inhale 2 puffs into the lungs every 6 (six) hours as needed. For shortness of breath         . azithromycin (ZITHROMAX) 250 MG tablet   Oral   Take 1 tablet (250 mg total) by mouth daily. Take 2 tablets by mouth on day 1, then 1 tablet by mouth daily on days 2-5  6 tablet   0   . diphenhydrAMINE (BENADRYL) 25 MG tablet   Oral   Take 75 mg by mouth daily as needed for itching (Takes prior to dialysis).         . ondansetron (ZOFRAN ODT) 4 MG disintegrating tablet   Oral   Take 1 tablet (4 mg total) by mouth every 8 (eight) hours as needed for nausea.   40 tablet   0   . promethazine (PHENERGAN) 25 MG tablet   Oral   Take 1 tablet (25 mg total) by mouth  every 6 (six) hours as needed for nausea.   20 tablet   0    BP 181/75  Pulse 64  Temp(Src) 98.4 F (36.9 C) (Oral)  Resp 16  Ht 5\' 2"  (1.575 m)  Wt 150 lb (68.04 kg)  BMI 27.43 kg/m2  SpO2 97% Physical Exam  Nursing note and vitals reviewed. Constitutional: She is oriented to person, place, and time. She appears well-developed and well-nourished.  HENT:  Head: Normocephalic and atraumatic.  Right Ear: External ear normal.  Left Ear: External ear normal.  Nose: Nose normal.  Mouth/Throat: Oropharynx is clear and moist.  Eyes: Conjunctivae and EOM are normal. Pupils are equal, round, and reactive to light.  Neck: Normal range of motion. Neck supple. No JVD present. No tracheal deviation present. No thyromegaly present.  Cardiovascular: Normal rate, regular rhythm, normal heart sounds and intact distal pulses.  Exam reveals no gallop and no friction rub.   No murmur heard. Pulmonary/Chest: No stridor. No respiratory distress. She has wheezes. She has no rales. She exhibits no tenderness.  Prolonged expiratory phase  Abdominal: Soft. Bowel sounds are normal. She exhibits no distension and no mass. There is no tenderness. There is no rebound and no guarding.  Musculoskeletal: Normal range of motion. She exhibits no edema and no tenderness.  Lymphadenopathy:    She has no cervical adenopathy.  Neurological: She is alert and oriented to person, place, and time. She exhibits normal muscle tone. Coordination normal.  Skin: Skin is warm and dry. No rash noted. No erythema. No pallor.  Psychiatric: She has a normal mood and affect. Her behavior is normal. Judgment and thought content normal.    ED Course  Procedures (including critical care time) Labs Review Labs Reviewed  PRO B NATRIURETIC PEPTIDE - Abnormal; Notable for the following:    Pro B Natriuretic peptide (BNP) 10221.0 (*)    All other components within normal limits  CBC WITH DIFFERENTIAL - Abnormal; Notable for the  following:    Hemoglobin 10.9 (*)    HCT 31.8 (*)    All other components within normal limits  BASIC METABOLIC PANEL - Abnormal; Notable for the following:    Chloride 94 (*)    BUN 63 (*)    Creatinine, Ser 9.42 (*)    GFR calc non Af Amer 4 (*)    GFR calc Af Amer 4 (*)    All other components within normal limits  TROPONIN I   Imaging Review No results found.    Date: 09/23/2012  Rate: 66  Rhythm: normal sinus rhythm  QRS Axis: left  Intervals: normal  ST/T Wave abnormalities: normal  Conduction Disutrbances:left anterior fascicular block  Narrative Interpretation:   Old EKG Reviewed: unchanged   MDM   1. COPD (chronic obstructive pulmonary disease)     67 yo female with persistent COPD exacerbation.  She is amenable to admission at this time.  Wheezing much  improved after EMS tx, but is failing outpatient management.      Olivia Mackie, MD 09/23/12 [phone]

## 2012-09-23 NOTE — ED Notes (Signed)
Report given to [place], [place] on floor. RN has no further questions upon report given. Pt being prepared for transport to floor.

## 2012-09-24 DIAGNOSIS — J449 Chronic obstructive pulmonary disease, unspecified: Secondary | ICD-10-CM | POA: Diagnosis not present

## 2012-09-24 DIAGNOSIS — J441 Chronic obstructive pulmonary disease with (acute) exacerbation: Secondary | ICD-10-CM | POA: Diagnosis not present

## 2012-09-24 DIAGNOSIS — J9600 Acute respiratory failure, unspecified whether with hypoxia or hypercapnia: Secondary | ICD-10-CM | POA: Diagnosis not present

## 2012-09-24 DIAGNOSIS — N186 End stage renal disease: Secondary | ICD-10-CM | POA: Diagnosis not present

## 2012-09-24 LAB — BASIC METABOLIC PANEL
BUN: 37 mg/dL — ABNORMAL HIGH (ref 6–23)
Calcium: 9.2 mg/dL (ref 8.4–10.5)
Chloride: 95 mEq/L — ABNORMAL LOW (ref 96–112)
Creatinine, Ser: 5.58 mg/dL — ABNORMAL HIGH (ref 0.50–1.10)
GFR calc Af Amer: 8 mL/min — ABNORMAL LOW (ref >90)
GFR calc non Af Amer: 7 mL/min — ABNORMAL LOW (ref >90)
Glucose, Bld: 137 mg/dL — ABNORMAL HIGH (ref 70–99)
Potassium: 4.3 mEq/L (ref 3.5–5.1)

## 2012-09-24 LAB — CBC
HCT: 31.2 % — ABNORMAL LOW (ref 36.0–46.0)
Hemoglobin: 10.6 g/dL — ABNORMAL LOW (ref 12.0–15.0)
MCH: 28 pg (ref 26.0–34.0)
MCHC: 34 g/dL (ref 30.0–36.0)
Platelets: 208 10*3/uL (ref 150–400)
RDW: 14.1 % (ref 11.5–15.5)

## 2012-09-24 MED ORDER — ALBUTEROL SULFATE (5 MG/ML) 0.5% IN NEBU
2.5000 mg | INHALATION_SOLUTION | RESPIRATORY_TRACT | Status: DC
  Administered 2012-09-24 (×3): 2.5 mg via RESPIRATORY_TRACT
  Filled 2012-09-24 (×3): qty 0.5

## 2012-09-24 MED ORDER — IPRATROPIUM BROMIDE 0.02 % IN SOLN
0.5000 mg | RESPIRATORY_TRACT | Status: DC
  Administered 2012-09-24 (×3): 0.5 mg via RESPIRATORY_TRACT
  Filled 2012-09-24 (×2): qty 2.5

## 2012-09-24 MED ORDER — HYDRALAZINE HCL 20 MG/ML IJ SOLN
10.0000 mg | Freq: Three times a day (TID) | INTRAMUSCULAR | Status: DC | PRN
  Administered 2012-09-26: 10 mg via INTRAVENOUS
  Filled 2012-09-24: qty 1

## 2012-09-24 NOTE — Care Management Note (Signed)
    Page 1 of 1   09/26/2012     3:04:58 PM   CARE MANAGEMENT NOTE 09/26/2012  Patient:  [name],[name]   Account Number:  [id number]  Date Initiated:  09/24/2012  Documentation initiated by:  Haydn Cush  Subjective/Objective Assessment:   PT ADM ON 9/22/1 WITH SOB, COPD EXACERBATION.  PTA, PT INDEPENDENT OF ADLS.     Action/Plan:   WILL FOLLOW FOR DISCHARGE NEEDS AS PT PROGRESSES.   Anticipated DC Date:  09/27/2012   Anticipated DC Plan:  HOME W HOME HEALTH SERVICES      DC Planning Services  CM consult  Medication Assistance      Choice offered to / List presented to:             Status of service:  Completed, signed off Medicare Important Message given?   (If response is "NO", the following Medicare IM given date fields will be blank) Date Medicare IM given:   Date Additional Medicare IM given:    Discharge Disposition:  HOME/SELF CARE  Per UR Regulation:  Reviewed for med. necessity/level of care/duration of stay  If discussed at Long Length of Stay Meetings, dates discussed:    Comments:  09/26/12 Rhyen Mazariego,RN,BSN [phone] PT STATES SHE IS HAVING TROUBLE AFFORDING MEDICATION COPAYS.  HAS RX FOR ADVAIR AND PREDNISONE.  PRED WILL ONLY BE $4.  PT GIVEN TWO RX DRUG ASSIST CARDS WHICH WILL HOPEFULLY HELP WITH MED COSTS.  SHE IS VERY APPRECIATIVE OF HELP.

## 2012-09-24 NOTE — Progress Notes (Signed)
Nutrition Brief Note  Patient identified on the Malnutrition Screening Tool (MST) Report  Wt Readings from Last 15 Encounters:  09/24/12 154 lb 8.7 oz (70.1 kg)  09/16/12 145 lb (65.772 kg)  08/08/12 148 lb (67.132 kg)  09/14/11 148 lb (67.132 kg)  07/03/12 154 lb (69.854 kg)  07/01/12 148 lb 13 oz (67.5 kg)  02/29/12 138 lb (62.596 kg)  01/22/12 130 lb 11.7 oz (59.3 kg)  09/19/11 147 lb 12.8 oz (67.042 kg)  06/20/11 148 lb 9.6 oz (67.405 kg)  05/16/11 152 lb 1.9 oz (69 kg)  05/04/11 147 lb 12.8 oz (67.042 kg)  02/27/11 148 lb 13 oz (67.5 kg)  02/27/11 148 lb 13 oz (67.5 kg)  12/23/10 165 lb 9.1 oz (75.1 kg)    Body mass index is 28.26 kg/(m^2). Patient meets criteria for overweight based on current BMI.   Current diet order is Renal 80/90, patient is consuming approximately 75-100% of meals at this time. Labs and medications reviewed.   Pt reports she typically follows a Renal diet at home and admits to being somewhat compliant.  She is currently eating well.  She requests more salt with meals.  RD discussed limitations of renal diet. Pt denies additional questions or education needs. No nutrition interventions warranted at this time. If nutrition issues arise, please consult RD.   Loyce Dys, MS RD LDN Clinical Inpatient Dietitian Pager: [phone] Weekend/After hours pager: [phone]

## 2012-09-24 NOTE — Progress Notes (Signed)
[facility] PROGRESS NOTE  [name] JXB:[id number] DOB: 1945-10-14 DOA: 09/23/2012 PCP: [name], MD  Assessment/Plan: COPD exacerbation  1. Add schedule albuterol, ipratropium.  2. Will continue on scheduled IV steroids with plans to titrate as tolerate 3. Cont O2 per protocol 4. The patient has completed 5 days of empiric azithromycin,  HTN  1. Cont home meds for now, Norvasc, coreg, Hydralazine.  2. PRN IV hydralazine  CAD  1. Cont asa per home regimen 2. Asymptomatic  Chronic diastolic CHF  1. Compensated. No signs of vol overload  ESRD  1. On MWF HD 2. Nephrology planning dialysis 9-24  Hx seizures  1. Continue with Keppra.  2. Stable  DVT prophylaxis  5. Documented allergy to heparin products 6. SCD's for now   Code Status: Full Code Family Communication: care discussed with patient.  Disposition Plan: remain inpatient   Consultants:  Renal  Procedures:  none  Antibiotics:  Received Azithromycin 5 days.   HPI/Subjective: Feeling better, breathing better.   Objective: Filed Vitals:   09/24/12 0501  BP: 183/73  Pulse: 67  Temp: 98.1 F (36.7 C)  Resp: 18    Intake/Output Summary (Last 24 hours) at 09/24/12 1213 Last data filed at 09/24/12 0800  Gross per 24 hour  Intake    240 ml  Output   2470 ml  Net  -2230 ml   Filed Weights   09/23/12 1530 09/23/12 2000 09/24/12 0501  Weight: 71.8 kg (158 lb 4.6 oz) 69.4 kg (153 lb) 70.1 kg (154 lb 8.7 oz)    Exam:   General:  No distress.   Cardiovascular: S 1, S 2 RRR  Respiratory: Bilateral wheezes  Abdomen: BS present, soft, nt  Musculoskeletal: no edema  Data Reviewed: Basic Metabolic Panel:  Recent Labs Lab 09/19/12 1444 09/23/12 0450 09/23/12 1545 09/24/12 0525  NA 132* 136 132* 135  K 4.0 4.2 4.7 4.3  CL 90* 94* 91* 95*  CO2 27 28 23 27   GLUCOSE 92 86 189* 137*  BUN 25* 63* 72* 37*  CREATININE 7.21* 9.42* 10.08* 5.58*  CALCIUM 9.6 10.3 9.6 9.2   PHOS  --   --  4.4  --    Liver Function Tests:  Recent Labs Lab 09/23/12 1545  ALBUMIN 3.0*   No results found for this basename: LIPASE, AMYLASE,  in the last 168 hours No results found for this basename: AMMONIA,  in the last 168 hours CBC:  Recent Labs Lab 09/19/12 1444 09/23/12 0450 09/24/12 0525  WBC 3.7* 7.3 9.4  NEUTROABS  --  5.7  --   HGB 10.3* 10.9* 10.6*  HCT 31.3* 31.8* 31.2*  MCV 83.5 81.3 82.3  PLT 158 202 208   Cardiac Enzymes:  Recent Labs Lab 09/23/12 0450  TROPONINI <0.30   BNP (last 3 results)  Recent Labs  09/23/12 0450  PROBNP 10221.0*   CBG: No results found for this basename: GLUCAP,  in the last 168 hours  No results found for this or any previous visit (from the past 240 hour(s)).   Studies: Dg Chest 2 View  09/23/2012   CLINICAL DATA:  Cough and congestion. COPD exacerbation.  EXAM: CHEST  2 VIEW  COMPARISON:  PA and lateral chest 09/16/2012 and 09/22/2010.  FINDINGS: Heart size and mediastinal contours are within normal limits. Both lungs are clear. Visualized skeletal structures are unremarkable.  IMPRESSION: No active cardiopulmonary disease.   Electronically Signed   By: Drusilla Kanner M.D.   On: 09/23/2012  04:47    Scheduled Meds: . albuterol  2.5 mg Nebulization Q4H  . amLODipine  10 mg Oral Daily  . aspirin EC  81 mg Oral Daily  . carvedilol  25 mg Oral BID WC  . cinacalcet  60 mg Oral QHS  . darbepoetin (ARANESP) injection - DIALYSIS  25 mcg Intravenous Q Mon-HD  . [START ON 09/25/2012] ferric gluconate (FERRLECIT/NULECIT) IV  62.5 mg Intravenous Q Wed-HD  . hydrALAZINE  50 mg Oral TID  . influenza vac split quadrivalent PF  0.5 mL Intramuscular Tomorrow-1000  . lanthanum  1,000 mg Oral TID WC  . levETIRAcetam  500 mg Oral Q2000  . levothyroxine  88 mcg Oral QAC breakfast  . methylPREDNISolone (SOLU-MEDROL) injection  60 mg Intravenous BID  . nystatin   Topical BID  . pantoprazole  40 mg Oral Daily  . simvastatin   20 mg Oral QHS   Continuous Infusions:   Principal Problem:   COPD exacerbation Active Problems:   Essential hypertension, malignant   CAD   Chronic diastolic heart failure   ESRD   Seizure    Time spent: 35 minutes.     [name]  [facility] Pager [phone]. If 7PM-7AM, please contact night-coverage at [email or web address], password [facility] 09/24/2012, 12:13 PM  LOS: 1 day

## 2012-09-24 NOTE — Progress Notes (Signed)
Renal Service Daily Progress Note [facility]  Subjective: Feeling better  Physical Exam:  Blood pressure 183/73, pulse 67, temperature 98.1 F (36.7 C), temperature source Oral, resp. rate 18, height 5\' 2"  (1.575 m), weight 70.1 kg (154 lb 8.7 oz), SpO2 98.00%. Gen: alert, no distress Neck: JVD elevation  Lungs: occ exp wheezes, occ crackles Heart: RRR with S1 S2, soft SEM murmurs, rubs, or gallops appreciated Abd: Soft, non-tender, non-distended Ext: no edema or ischemic changes, no open wounds  Neuro: alert and O X 3  Access: LUA Hero + bruit   Dialysis Orders: ([facility] on M[facility])  67.5 kg   2K/2Ca    4hrs   Heparin none (due to allergy)   LUA AVG    350/A1.5 Hectorol 6 mcg     Epogen 2000   Venofer 50 mg/wk  Recent labs: Hgb 10.7 < 11.4 < 11.6, P 7.5 on Renvela 4-5 ac, PTH 440 on 8/27, Tsat 33%  Assessment:  1. COPD exacerbation- nebs, steroids, per primary, improving 2. ESRD, cont MWF HD,  no heparin d/t allergy 3. Hypertension/volume - SBPs 140s-180s on home Coreg 25 mg BID, Norvasc 10, Hydralazine TID plus PRN labetolol here. Standing wgt with HD. Up only about 0.5kg as outpatient. Try for UF 2.5L 4. Anemia - Hgb 10.9, trending down op on Epo 2000 u. Aranesp 25 ordered here q Mondays. Weekly Fe q Wed. 5. Metabolic bone disease - Ca 10.2 (11.1 corrected). Low Ca bath. Hold hectorol 6 mcg for hypercalcemia. Last Phos 7.5 - poorly controlled for some time on op Renvela - pt states taking 4-5 ac. Previously has not wanted to try a different agent because she has a large supply of renvela at home. Will try Fosrenol while here. New rx at increased dose if tolerated. Last PTH 440. Continue Sensipar 60. Renal panel with HD.  6. Nutrition - alb 3.0. Renal diet, multivitamin 7. Hypothyroidism - on synthroid 8. CAD - on ASA 9. Chronic diastolic CHF - compensated per admit 10. Hx seizures - on Keppra 11. Rectal itching / fungal dermatitis- appears to be resolving. Will honor pt  request for nystatin powder.  P- HD tomorrow    Recent Labs Lab 09/23/12 0450 09/23/12 1545 09/24/12 0525  NA 136 132* 135  K 4.2 4.7 4.3  CL 94* 91* 95*  CO2 28 23 27   GLUCOSE 86 189* 137*  BUN 63* 72* 37*  CREATININE 9.42* 10.08* 5.58*  CALCIUM 10.3 9.6 9.2  PHOS  --  4.4  --     Recent Labs Lab 09/23/12 1545  ALBUMIN 3.0*    Recent Labs Lab 09/19/12 1444 09/23/12 0450 09/24/12 0525  WBC 3.7* 7.3 9.4  NEUTROABS  --  5.7  --   HGB 10.3* 10.9* 10.6*  HCT 31.3* 31.8* 31.2*  MCV 83.5 81.3 82.3  PLT 158 202 208   . amLODipine  10 mg Oral Daily  . aspirin EC  81 mg Oral Daily  . azithromycin  250 mg Oral Daily  . carvedilol  25 mg Oral BID WC  . cinacalcet  60 mg Oral QHS  . darbepoetin (ARANESP) injection - DIALYSIS  25 mcg Intravenous Q Mon-HD  . [START ON 09/25/2012] ferric gluconate (FERRLECIT/NULECIT) IV  62.5 mg Intravenous Q Wed-HD  . hydrALAZINE  50 mg Oral TID  . influenza vac split quadrivalent PF  0.5 mL Intramuscular Tomorrow-1000  . lanthanum  1,000 mg Oral TID WC  . levETIRAcetam  500 mg Oral Q2000  .  levothyroxine  88 mcg Oral QAC breakfast  . methylPREDNISolone (SOLU-MEDROL) injection  60 mg Intravenous BID  . nystatin   Topical BID  . pantoprazole  40 mg Oral Daily  . simvastatin  20 mg Oral QHS     acetaminophen, acetaminophen, albuterol, diphenhydrAMINE, hydrOXYzine, ipratropium, labetalol, ondansetron (ZOFRAN) IV, ondansetron, traZODone

## 2012-09-25 DIAGNOSIS — I10 Essential (primary) hypertension: Secondary | ICD-10-CM | POA: Diagnosis not present

## 2012-09-25 DIAGNOSIS — N186 End stage renal disease: Secondary | ICD-10-CM

## 2012-09-25 DIAGNOSIS — N2581 Secondary hyperparathyroidism of renal origin: Secondary | ICD-10-CM | POA: Diagnosis not present

## 2012-09-25 DIAGNOSIS — D509 Iron deficiency anemia, unspecified: Secondary | ICD-10-CM | POA: Diagnosis not present

## 2012-09-25 DIAGNOSIS — J9600 Acute respiratory failure, unspecified whether with hypoxia or hypercapnia: Secondary | ICD-10-CM | POA: Diagnosis not present

## 2012-09-25 DIAGNOSIS — J449 Chronic obstructive pulmonary disease, unspecified: Secondary | ICD-10-CM | POA: Diagnosis not present

## 2012-09-25 DIAGNOSIS — D631 Anemia in chronic kidney disease: Secondary | ICD-10-CM | POA: Diagnosis not present

## 2012-09-25 DIAGNOSIS — J441 Chronic obstructive pulmonary disease with (acute) exacerbation: Secondary | ICD-10-CM | POA: Diagnosis not present

## 2012-09-25 DIAGNOSIS — Z23 Encounter for immunization: Secondary | ICD-10-CM | POA: Diagnosis not present

## 2012-09-25 DIAGNOSIS — I5032 Chronic diastolic (congestive) heart failure: Secondary | ICD-10-CM | POA: Diagnosis not present

## 2012-09-25 LAB — GLUCOSE, CAPILLARY: Glucose-Capillary: 96 mg/dL (ref 70–99)

## 2012-09-25 MED ORDER — ALBUTEROL SULFATE (5 MG/ML) 0.5% IN NEBU
2.5000 mg | INHALATION_SOLUTION | Freq: Two times a day (BID) | RESPIRATORY_TRACT | Status: DC
  Administered 2012-09-25 – 2012-09-26 (×2): 2.5 mg via RESPIRATORY_TRACT
  Filled 2012-09-25 (×3): qty 0.5

## 2012-09-25 MED ORDER — NEPRO/CARBSTEADY PO LIQD: 237.0000 mL | ORAL | Status: DC | PRN

## 2012-09-25 MED ORDER — LIDOCAINE HCL (PF) 1 % IJ SOLN: 5.0000 mL | INTRAMUSCULAR | Status: DC | PRN

## 2012-09-25 MED ORDER — SODIUM CHLORIDE 0.9 % IV SOLN: 100.0000 mL | INTRAVENOUS | Status: DC | PRN

## 2012-09-25 MED ORDER — ALBUTEROL SULFATE (5 MG/ML) 0.5% IN NEBU: 2.5000 mg | INHALATION_SOLUTION | RESPIRATORY_TRACT | Status: DC | PRN

## 2012-09-25 MED ORDER — IPRATROPIUM BROMIDE 0.02 % IN SOLN: 0.5000 mg | Freq: Two times a day (BID) | RESPIRATORY_TRACT | Status: DC

## 2012-09-25 MED ORDER — PENTAFLUOROPROP-TETRAFLUOROETH EX AERO: 1.0000 "application " | INHALATION_SPRAY | CUTANEOUS | Status: DC | PRN

## 2012-09-25 MED ORDER — HEPARIN SODIUM (PORCINE) 1000 UNIT/ML DIALYSIS: 1000.0000 [IU] | INTRAMUSCULAR | Status: DC | PRN

## 2012-09-25 MED ORDER — IPRATROPIUM BROMIDE 0.02 % IN SOLN
0.5000 mg | Freq: Two times a day (BID) | RESPIRATORY_TRACT | Status: DC
  Administered 2012-09-25 – 2012-09-26 (×2): 0.5 mg via RESPIRATORY_TRACT
  Filled 2012-09-25 (×3): qty 2.5

## 2012-09-25 MED ORDER — LIDOCAINE-PRILOCAINE 2.5-2.5 % EX CREA: 1.0000 "application " | TOPICAL_CREAM | CUTANEOUS | Status: DC | PRN

## 2012-09-25 MED ORDER — PREDNISONE 50 MG PO TABS
60.0000 mg | ORAL_TABLET | Freq: Every day | ORAL | Status: DC
  Administered 2012-09-26: 60 mg via ORAL
  Filled 2012-09-25 (×3): qty 1

## 2012-09-25 MED ORDER — DIPHENHYDRAMINE HCL 25 MG PO CAPS
ORAL_CAPSULE | ORAL | Status: AC
  Administered 2012-09-25: 75 mg via ORAL
  Filled 2012-09-25: qty 3

## 2012-09-25 MED ORDER — ACETAMINOPHEN 325 MG PO TABS
ORAL_TABLET | ORAL | Status: AC
  Administered 2012-09-25: 325 mg via ORAL
  Filled 2012-09-25: qty 1

## 2012-09-25 MED ORDER — ALTEPLASE 2 MG IJ SOLR
2.0000 mg | Freq: Once | INTRAMUSCULAR | Status: DC | PRN
  Filled 2012-09-25: qty 2

## 2012-09-25 NOTE — Procedures (Signed)
I was present at this dialysis session. I have reviewed the session itself and made appropriate changes.   Sarah Moselle  MD Pager [phone]    Cell  [phone] 09/25/2012, 8:55 AM

## 2012-09-25 NOTE — Progress Notes (Signed)
Late entry for 09/24/12-- met with patient at bedside to explain [facility] Care Management services. She promptly declined services. Left [facility] Care Management brochure for her to call in future if she changes her mind.  Raiford Noble, MSN- Ed, Charity fundraiser, BSN- [facility] [phone]

## 2012-09-25 NOTE — Progress Notes (Signed)
Renal Service Daily Progress Note [facility]  Subjective: Feeling better, occ cough  Physical Exam:  Blood pressure 171/88, pulse 63, temperature 97.9 F (36.6 C), temperature source Oral, resp. rate 14, height 5\' 2"  (1.575 m), weight 71.5 kg (157 lb 10.1 oz), SpO2 99.00%. Gen: alert, no distress Neck: JVD elevation  Lungs: mostly clear now, occ exp wheeze Heart: RRR with S1 S2, soft SEM murmurs, rubs, or gallops appreciated Abd: Soft, non-tender, non-distended Ext: no edema or ischemic changes, no open wounds  Neuro: alert and O X 3  Access: LUA Hero + bruit   Dialysis Orders: ([facility] on MWF)  67.5 kg   2K/2Ca    4hrs   Heparin none (due to allergy)   LUA AVG    350/A1.5 Hectorol 6 mcg     Epogen 2000   Venofer 50 mg/wk  Recent labs: Hgb 10.7 < 11.4 < 11.6, P 7.5 on Renvela 4-5 ac, PTH 440 on 8/27, Tsat 33%  Assessment:  1. COPD exacerbation- nebs, steroids and abx per primary, improving 2. ESRD, cont MWF HD,  no heparin d/t allergy 3. HTN/volume, cont meds, UF 3-4kg to dry wt 4. Anemia - Hgb 10.9, trending down op on Epo 2000 u. Aranesp 25 ordered here q Mondays. Weekly Fe q Wed. 5. Metabolic bone disease - Ca 10.2 (11.1 corrected). Low Ca bath. Hold hectorol 6 mcg for hypercalcemia. Last Phos 7.5 - poorly controlled for some time on op Renvela - pt states taking 4-5 ac. Previously has not wanted to try a different agent because she has a large supply of renvela at home. Will try Fosrenol while here. New rx at increased dose if tolerated. Last PTH 440. Continue Sensipar 60. Renal panel with HD.  6. Nutrition - alb 3.0. Renal diet, multivitamin  P- HD today  Sarah Moselle  MD Pager [phone]    Cell  [phone] 09/25/2012, 8:54 AM       Recent Labs Lab 09/23/12 0450 09/23/12 1545 09/24/12 0525  NA 136 132* 135  K 4.2 4.7 4.3  CL 94* 91* 95*  CO2 28 23 27   GLUCOSE 86 189* 137*  BUN 63* 72* 37*  CREATININE 9.42* 10.08* 5.58*  CALCIUM 10.3 9.6 9.2   PHOS  --  4.4  --     Recent Labs Lab 09/23/12 1545  ALBUMIN 3.0*    Recent Labs Lab 09/19/12 1444 09/23/12 0450 09/24/12 0525  WBC 3.7* 7.3 9.4  NEUTROABS  --  5.7  --   HGB 10.3* 10.9* 10.6*  HCT 31.3* 31.8* 31.2*  MCV 83.5 81.3 82.3  PLT 158 202 208   . albuterol  2.5 mg Nebulization BID  . amLODipine  10 mg Oral Daily  . aspirin EC  81 mg Oral Daily  . carvedilol  25 mg Oral BID WC  . cinacalcet  60 mg Oral QHS  . darbepoetin (ARANESP) injection - DIALYSIS  25 mcg Intravenous Q Mon-HD  . ferric gluconate (FERRLECIT/NULECIT) IV  62.5 mg Intravenous Q Wed-HD  . hydrALAZINE  50 mg Oral TID  . ipratropium  0.5 mg Nebulization BID  . lanthanum  1,000 mg Oral TID WC  . levETIRAcetam  500 mg Oral Q2000  . levothyroxine  88 mcg Oral QAC breakfast  . methylPREDNISolone (SOLU-MEDROL) injection  60 mg Intravenous BID  . nystatin   Topical BID  . pantoprazole  40 mg Oral Daily  . simvastatin  20 mg Oral QHS     sodium chloride,  sodium chloride, acetaminophen, acetaminophen, albuterol, alteplase, diphenhydrAMINE, feeding supplement (NEPRO CARB STEADY), hydrALAZINE, hydrOXYzine, lidocaine (PF), lidocaine-prilocaine, ondansetron (ZOFRAN) IV, ondansetron, pentafluoroprop-tetrafluoroeth, traZODone

## 2012-09-25 NOTE — Progress Notes (Signed)
[facility] PROGRESS NOTE  [name] MVH:[id number] DOB: March 12, 1945 DOA: 09/23/2012 PCP: [name], MD  Assessment/Plan: 1. COPD exacerbation. Clinically improving, we'll discontinue IV steroids, start oral prednisone 60 mg by mouth daily. Continue duo nebs. Patient complaining 74 days of age azithromycin. 2. Hypertension. Blood pressure stable, on multiple antihypertensive agents with Norvasc, Coreg, hydralazine. 3. End-stage renal disease. Patient on hemodialysis Mondays Wednesdays and Fridays, nephrology consulted as she has undergone HD during this hospitalization 4. History of seizure disorder, stable, continue Keppra. 5. Congestive heart failure, diastolic, compensated. No signs of fluid overload  Code Status: Full code Family Communication: Plan discussed with patient at bedside Disposition Plan: Given clinical improvement anticipate discharge in the next 24 hours   Consultants:  Nephrology  Procedures:  Hemodialysis   HPI/Subjective:   Objective: Filed Vitals:   09/25/12 1115  BP: 149/64  Pulse: 63  Temp: 97.7 F (36.5 C)  Resp: 19    Intake/Output Summary (Last 24 hours) at 09/25/12 1543 Last data filed at 09/25/12 1401  Gross per 24 hour  Intake    600 ml  Output   3936 ml  Net  -3336 ml   Filed Weights   09/25/12 0617 09/25/12 0645 09/25/12 1115  Weight: 71.815 kg (158 lb 5.2 oz) 71.5 kg (157 lb 10.1 oz) 67.9 kg (149 lb 11.1 oz)    Exam:   General:  No acute distress she is awake alert oriented, states feeling better  Cardiovascular: Regular rate rhythm normal S1-S2 no murmurs or gallops  Respiratory: Diminished breath sounds bilaterally, mild expiratory wheezes, otherwise normal respiratory effort  Abdomen: Soft nontender nontender positive bowel sound  Musculoskeletal: Present range of motion of all extremities  Extremity: No edema   Data Reviewed: Basic Metabolic Panel:  Recent Labs Lab 09/19/12 1444 09/23/12 0450  09/23/12 1545 09/24/12 0525  NA 132* 136 132* 135  K 4.0 4.2 4.7 4.3  CL 90* 94* 91* 95*  CO2 27 28 23 27   GLUCOSE 92 86 189* 137*  BUN 25* 63* 72* 37*  CREATININE 7.21* 9.42* 10.08* 5.58*  CALCIUM 9.6 10.3 9.6 9.2  PHOS  --   --  4.4  --    Liver Function Tests:  Recent Labs Lab 09/23/12 1545  ALBUMIN 3.0*   No results found for this basename: LIPASE, AMYLASE,  in the last 168 hours No results found for this basename: AMMONIA,  in the last 168 hours CBC:  Recent Labs Lab 09/19/12 1444 09/23/12 0450 09/24/12 0525  WBC 3.7* 7.3 9.4  NEUTROABS  --  5.7  --   HGB 10.3* 10.9* 10.6*  HCT 31.3* 31.8* 31.2*  MCV 83.5 81.3 82.3  PLT 158 202 208   Cardiac Enzymes:  Recent Labs Lab 09/23/12 0450  TROPONINI <0.30   BNP (last 3 results)  Recent Labs  09/23/12 0450  PROBNP 10221.0*   CBG:  Recent Labs Lab 09/25/12 0915  GLUCAP 96    No results found for this or any previous visit (from the past 240 hour(s)).   Studies: No results found.  Scheduled Meds: . albuterol  2.5 mg Nebulization BID  . amLODipine  10 mg Oral Daily  . aspirin EC  81 mg Oral Daily  . carvedilol  25 mg Oral BID WC  . cinacalcet  60 mg Oral QHS  . darbepoetin (ARANESP) injection - DIALYSIS  25 mcg Intravenous Q Mon-HD  . ferric gluconate (FERRLECIT/NULECIT) IV  62.5 mg Intravenous Q Wed-HD  . hydrALAZINE  50  mg Oral TID  . ipratropium  0.5 mg Nebulization BID  . lanthanum  1,000 mg Oral TID WC  . levETIRAcetam  500 mg Oral Q2000  . levothyroxine  88 mcg Oral QAC breakfast  . nystatin   Topical BID  . pantoprazole  40 mg Oral Daily  . [START ON 09/26/2012] predniSONE  60 mg Oral Q breakfast  . simvastatin  20 mg Oral QHS   Continuous Infusions:   Principal Problem:   COPD exacerbation Active Problems:   Essential hypertension, malignant   CAD   Chronic diastolic heart failure   ESRD   Seizure    Time spent: 30 minutes    Jeralyn Bennett  [facility] Pager  [phone]. If 7PM-7AM, please contact night-coverage at [email or web address], password [email or web address] 09/25/2012, 3:43 PM  LOS: 2 days

## 2012-09-26 DIAGNOSIS — N186 End stage renal disease: Secondary | ICD-10-CM | POA: Diagnosis not present

## 2012-09-26 DIAGNOSIS — J9600 Acute respiratory failure, unspecified whether with hypoxia or hypercapnia: Secondary | ICD-10-CM | POA: Diagnosis not present

## 2012-09-26 DIAGNOSIS — J449 Chronic obstructive pulmonary disease, unspecified: Secondary | ICD-10-CM | POA: Diagnosis not present

## 2012-09-26 DIAGNOSIS — J441 Chronic obstructive pulmonary disease with (acute) exacerbation: Secondary | ICD-10-CM | POA: Diagnosis not present

## 2012-09-26 MED ORDER — PREDNISONE (PAK) 10 MG PO TABS: ORAL_TABLET | ORAL | Status: DC

## 2012-09-26 MED ORDER — SEVELAMER CARBONATE 800 MG PO TABS
3200.0000 mg | ORAL_TABLET | Freq: Three times a day (TID) | ORAL | Status: DC
  Administered 2012-09-26: 3200 mg via ORAL
  Filled 2012-09-26 (×3): qty 4

## 2012-09-26 MED ORDER — FLUTICASONE-SALMETEROL 250-50 MCG/DOSE IN AEPB: 1.0000 | INHALATION_SPRAY | Freq: Two times a day (BID) | RESPIRATORY_TRACT | Status: DC

## 2012-09-26 NOTE — Progress Notes (Signed)
Renal Service Daily Progress Note [facility]  Subjective: stable, 4kg off at HD yest  Physical Exam:  Blood pressure 166/69, pulse 65, temperature 98 F (36.7 C), temperature source Oral, resp. rate 18, height 5\' 2"  (1.575 m), weight 71.2 kg (156 lb 15.5 oz), SpO2 98.00%. Gen: alert, no distress Neck: JVD elevation  Lungs: clear bilat Heart: RRR with S1 S2, soft SEM murmurs, rubs, or gallops appreciated Abd: Soft, non-tender, non-distended Ext: no edema or ischemic changes, no open wounds  Neuro: alert and O X 3  Access: LUA Hero + bruit   Dialysis Orders: ([facility] on MWF)  67.5 kg   2K/2Ca    4hrs   Heparin none (due to allergy)   LUA AVG    350/A1.5 Hectorol 6 mcg     Epogen 2000   Venofer 50 mg/wk  Recent labs: Hgb 10.7 < 11.4 < 11.6, P 7.5 on Renvela 4-5 ac, PTH 440 on 8/27, Tsat 33%  Assessment:  1. COPD exacerbation- resolved, for d/c today 2. ESRD, cont MWF HD,  no heparin d/t allergy 3. HTN/volume- better 4. Anemia - Hgb 10.9, trending down op on Epo 2000 u. Aranesp 25 ordered here q Mondays. Weekly Fe q Wed. 5. Metabolic bone disease - high Ca > low Ca bath and vit D put on hold. High phos, has lots of Renvela at home will continue 4ac tid with Renvela.  High PTH (440), continue Sensipar 60 6. Nutrition - alb 3.0. Renal diet, multivitamin  P- D/C'd today  Sarah Moselle  MD Pager [phone]    Cell  [phone] 09/26/2012, 8:40 AM       Recent Labs Lab 09/23/12 0450 09/23/12 1545 09/24/12 0525  NA 136 132* 135  K 4.2 4.7 4.3  CL 94* 91* 95*  CO2 28 23 27   GLUCOSE 86 189* 137*  BUN 63* 72* 37*  CREATININE 9.42* 10.08* 5.58*  CALCIUM 10.3 9.6 9.2  PHOS  --  4.4  --     Recent Labs Lab 09/23/12 1545  ALBUMIN 3.0*    Recent Labs Lab 09/19/12 1444 09/23/12 0450 09/24/12 0525  WBC 3.7* 7.3 9.4  NEUTROABS  --  5.7  --   HGB 10.3* 10.9* 10.6*  HCT 31.3* 31.8* 31.2*  MCV 83.5 81.3 82.3  PLT 158 202 208   . albuterol  2.5 mg  Nebulization BID  . amLODipine  10 mg Oral Daily  . aspirin EC  81 mg Oral Daily  . carvedilol  25 mg Oral BID WC  . cinacalcet  60 mg Oral QHS  . darbepoetin (ARANESP) injection - DIALYSIS  25 mcg Intravenous Q Mon-HD  . ferric gluconate (FERRLECIT/NULECIT) IV  62.5 mg Intravenous Q Wed-HD  . hydrALAZINE  50 mg Oral TID  . ipratropium  0.5 mg Nebulization BID  . lanthanum  1,000 mg Oral TID WC  . levETIRAcetam  500 mg Oral Q2000  . levothyroxine  88 mcg Oral QAC breakfast  . nystatin   Topical BID  . pantoprazole  40 mg Oral Daily  . predniSONE  60 mg Oral Q breakfast  . simvastatin  20 mg Oral QHS     acetaminophen, acetaminophen, albuterol, diphenhydrAMINE, hydrALAZINE, hydrOXYzine, ondansetron (ZOFRAN) IV, ondansetron, traZODone

## 2012-09-26 NOTE — Discharge Summary (Signed)
Physician Discharge Summary  [name] XBJ:[id number] DOB: 1945/05/02 DOA: 09/23/2012  PCP: [name], MD  Admit date: 09/23/2012 Discharge date: 09/26/2012  Time spent: 35 minutes  Follow up Please followup on blood pressures, she is on multiple antihypertensive agents. Systolic blood pressures during this hospitalization fluctuated between 130 and a 190.   Discharge Diagnoses:  Principal Problem:   COPD exacerbation Active Problems:   Essential hypertension, malignant   CAD   Chronic diastolic heart failure   ESRD   Seizure   Discharge Condition: Stable/improved  Diet recommendation: Heart healthy diet  Filed Weights   09/25/12 0645 09/25/12 1115 09/26/12 0450  Weight: 71.5 kg (157 lb 10.1 oz) 67.9 kg (149 lb 11.1 oz) 71.2 kg (156 lb 15.5 oz)    History of present illness:  [name] is a 67 y.o. female  With a hx of ESRD on MWF HD, CAD, chronic diastolic CHF, and seizures who initially presented to the ED on 9/15 with complaints of SOB and cough. She was given a cough suppressant and discharged home. The patient continued with sob and wheezing and saw her PCP on 9/18. She was prescribed prednisone and given a breathing treatment and later referred to the ED given no improvement. In the ED, the patient was given IV steroids and neb treatments. The patient went home with azithromycin against recommendations for admission. The patient returns today with continued sob. She again was given a course of IV steroids with notable improvement. The hosptiatlist was consulted for possible admission   Hospital Course:  Patient is a pleasant 67 year old with a history of COPD, active tobacco abuse, end-stage renal disease who was admitted for COPD exacerbation. She presented to the emergency department on 09/23/2012 with complaints of worsening shortness of breath, cough, wheezing and scant sputum production. She was seen by her primary care provider on 09/19/2012 is prescribed  prednisone. Patient had also been on a azithromycin. Despite these interventions, patient did not improve. She was admitted to medicine where she was started on IV steroids, DuoNeb's, provide supportive care. With regard to her incisional disease, nephrology was consulted and she underwent hemodialysis during this hospitalization. Patient showed marked clinical improvement and on 09/25/2012 was transitioned to oral steroids. She was able to ambulate without developing significant shortness of breath or hypoxemia. Given patient's stability she was discharged on 09/26/2012.   Consultations:  Nephrology  Discharge Exam: Filed Vitals:   09/26/12 0832  BP:   Pulse: 65  Temp:   Resp: 18    General: No acute distress, reports feeling better, tolerating by mouth intake, feels ready to go home today. Cardiovascular: Regular rate and rhythm normal S1-S2 no murmurs rubs or gout Respiratory: Lungs are clear, significant improvement to lung examination with decreased wheezes, good air movement. Abdomen: Soft nontender nondistended positive bowel sound Extremity: No edema  Discharge Instructions  Discharge Orders   Future Appointments Provider Department Dept Phone   08/07/2013 10:00 AM York Spaniel, MD [facility] [phone]   Future Orders Complete By Expires   Call MD for:  difficulty breathing, headache or visual disturbances  As directed    Call MD for:  extreme fatigue  As directed    Call MD for:  persistant nausea and vomiting  As directed    Call MD for:  temperature >100.4  As directed    Diet - low sodium heart healthy  As directed    Discharge instructions  As directed    Comments:  Please keep follow up appointments   Increase activity slowly  As directed        Medication List    STOP taking these medications       azithromycin 250 MG tablet  Commonly known as:  ZITHROMAX     loperamide 2 MG capsule  Commonly known as:  IMODIUM      predniSONE 20 MG tablet  Commonly known as:  DELTASONE  Replaced by:  predniSONE 10 MG tablet      TAKE these medications       acetaminophen 500 MG tablet  Commonly known as:  TYLENOL  Take 500 mg by mouth every 6 (six) hours as needed for pain.     albuterol-ipratropium 18-103 MCG/ACT inhaler  Commonly known as:  COMBIVENT  Inhale 2 puffs into the lungs every 6 (six) hours as needed. For shortness of breath     amLODipine 10 MG tablet  Commonly known as:  NORVASC  Take 10 mg by mouth daily.     aspirin EC 81 MG tablet  Take 81 mg by mouth daily.     carvedilol 25 MG tablet  Commonly known as:  COREG  Take 25 mg by mouth 2 (two) times daily with a meal.     cinacalcet 30 MG tablet  Commonly known as:  SENSIPAR  Take 60 mg by mouth at bedtime.     diphenhydrAMINE 25 MG tablet  Commonly known as:  BENADRYL  Take 75 mg by mouth daily as needed for itching (Takes prior to dialysis).     Fluticasone-Salmeterol 250-50 MCG/DOSE Aepb  Commonly known as:  ADVAIR DISKUS  Inhale 1 puff into the lungs 2 (two) times daily.     hydrALAZINE 50 MG tablet  Commonly known as:  APRESOLINE  Take 50 mg by mouth 3 (three) times daily.     levETIRAcetam 500 MG tablet  Commonly known as:  KEPPRA  Take 500 mg by mouth daily at 8 pm.     levothyroxine 88 MCG tablet  Commonly known as:  SYNTHROID, LEVOTHROID  Take 88 mcg by mouth daily before breakfast.     NITROSTAT 0.4 MG SL tablet  Generic drug:  nitroGLYCERIN  Place 0.4 mg under the tongue every 5 (five) minutes as needed. For chest pain.     ondansetron 4 MG disintegrating tablet  Commonly known as:  ZOFRAN ODT  Take 1 tablet (4 mg total) by mouth every 8 (eight) hours as needed for nausea.     pravastatin 20 MG tablet  Commonly known as:  PRAVACHOL  Take 20 mg by mouth daily.     predniSONE 10 MG tablet  Commonly known as:  STERAPRED UNI-PAK  Take 6-5-4-3-2-1 tablets by mouth daily till gone.     promethazine 25 MG  tablet  Commonly known as:  PHENERGAN  Take 1 tablet (25 mg total) by mouth every 6 (six) hours as needed for nausea.     sevelamer 800 MG tablet  Commonly known as:  RENAGEL  Take 1,600-3,200 mg by mouth See admin instructions. Take 4 tablets in the morning, at lunch, and 4 tablets dinner. Takes 1,600 mg with snacks twice a day.     traZODone 50 MG tablet  Commonly known as:  DESYREL  Take 50 mg by mouth at bedtime as needed for sleep.       Allergies  Allergen Reactions  . Iohexol Swelling and Other (See Comments)    1970s; passed out and had facial/tongue  swelling.  Requires 13-hour prep with prednisone and benadryl  . Ampicillin Hives and Rash  . Meperidine Hcl Swelling and Rash    Makes tongue swell  . Morphine Rash  . Penicillins Rash  . Valproic Acid And Related Other (See Comments)    Confusion   . Heparin     MDs told her not to take after reaction in ICU  . Pentazocine Lactate     Patient does not remember reaction to this med (Talwin).   . Phenytoin Other (See Comments)    Had reaction while in ICU; doesn't know.  MDs told her not to take ever again.  . Wellbutrin [Bupropion]     seizures  . Ace Inhibitors Rash       Follow-up Information   Follow up with [name], MD In 2 weeks.   Specialty:  Internal Medicine   Contact information:   [address] [place] [address] [phone]        The results of significant diagnostics from this hospitalization (including imaging, microbiology, ancillary and laboratory) are listed below for reference.    Significant Diagnostic Studies: Dg Chest 2 View  09/23/2012   CLINICAL DATA:  Cough and congestion. COPD exacerbation.  EXAM: CHEST  2 VIEW  COMPARISON:  PA and lateral chest 09/16/2012 and 09/22/2010.  FINDINGS: Heart size and mediastinal contours are within normal limits. Both lungs are clear. Visualized skeletal structures are unremarkable.  IMPRESSION: No active cardiopulmonary  disease.   Electronically Signed   By: Drusilla Kanner M.D.   On: 09/23/2012 04:47   Dg Chest 2 View  09/16/2012   *RADIOLOGY REPORT*  Clinical Data: Cough  CHEST - 2 VIEW  Comparison: 06/27/2012  Findings: Linear left lower lobe atelectasis is noted with overall improved aeration compared to the prior exam.  No pleural effusion. No acute abnormality.  Heart size at upper limits of normal.  IMPRESSION: No acute cardiopulmonary process.  Heart size remains at upper limits of normal.   Original Report Authenticated By: Christiana Pellant, M.D.   Dg Chest Port 1 View  09/19/2012   *RADIOLOGY REPORT*  Clinical Data: Cough, chest pain, shortness of breath, and chest congestion.  PORTABLE CHEST - 1 VIEW  Comparison: 09/16/2012.  Findings: Heart size and pulmonary vascularity are within normal limits.  The lungs are clear.  No acute osseous abnormality.  IMPRESSION: No acute disease.   Original Report Authenticated By: Francene Boyers, M.D.    Microbiology: No results found for this or any previous visit (from the past 240 hour(s)).   Labs: Basic Metabolic Panel:  Recent Labs Lab 09/19/12 1444 09/23/12 0450 09/23/12 1545 09/24/12 0525  NA 132* 136 132* 135  K 4.0 4.2 4.7 4.3  CL 90* 94* 91* 95*  CO2 27 28 23 27   GLUCOSE 92 86 189* 137*  BUN 25* 63* 72* 37*  CREATININE 7.21* 9.42* 10.08* 5.58*  CALCIUM 9.6 10.3 9.6 9.2  PHOS  --   --  4.4  --    Liver Function Tests:  Recent Labs Lab 09/23/12 1545  ALBUMIN 3.0*   No results found for this basename: LIPASE, AMYLASE,  in the last 168 hours No results found for this basename: AMMONIA,  in the last 168 hours CBC:  Recent Labs Lab 09/19/12 1444 09/23/12 0450 09/24/12 0525  WBC 3.7* 7.3 9.4  NEUTROABS  --  5.7  --   HGB 10.3* 10.9* 10.6*  HCT 31.3* 31.8* 31.2*  MCV 83.5 81.3 82.3  PLT 158 202 208   Cardiac Enzymes:  Recent Labs Lab 09/23/12 0450  TROPONINI <0.30   BNP: BNP (last 3 results)  Recent Labs  09/23/12 0450   PROBNP 10221.0*   CBG:  Recent Labs Lab 09/25/12 0915  GLUCAP 96       Signed:  Sherrelle Prochazka  [facility] 09/26/2012, 8:44 AM

## 2012-09-27 DIAGNOSIS — D509 Iron deficiency anemia, unspecified: Secondary | ICD-10-CM | POA: Diagnosis not present

## 2012-09-27 DIAGNOSIS — N186 End stage renal disease: Secondary | ICD-10-CM | POA: Diagnosis not present

## 2012-09-27 DIAGNOSIS — Z23 Encounter for immunization: Secondary | ICD-10-CM | POA: Diagnosis not present

## 2012-09-27 DIAGNOSIS — N2581 Secondary hyperparathyroidism of renal origin: Secondary | ICD-10-CM | POA: Diagnosis not present

## 2012-09-27 DIAGNOSIS — D631 Anemia in chronic kidney disease: Secondary | ICD-10-CM | POA: Diagnosis not present

## 2012-09-30 DIAGNOSIS — N186 End stage renal disease: Secondary | ICD-10-CM | POA: Diagnosis not present

## 2012-09-30 DIAGNOSIS — N2581 Secondary hyperparathyroidism of renal origin: Secondary | ICD-10-CM | POA: Diagnosis not present

## 2012-09-30 DIAGNOSIS — D509 Iron deficiency anemia, unspecified: Secondary | ICD-10-CM | POA: Diagnosis not present

## 2012-09-30 DIAGNOSIS — Z23 Encounter for immunization: Secondary | ICD-10-CM | POA: Diagnosis not present

## 2012-09-30 DIAGNOSIS — D631 Anemia in chronic kidney disease: Secondary | ICD-10-CM | POA: Diagnosis not present

## 2012-10-01 DIAGNOSIS — N186 End stage renal disease: Secondary | ICD-10-CM | POA: Diagnosis not present

## 2012-10-02 DIAGNOSIS — D631 Anemia in chronic kidney disease: Secondary | ICD-10-CM | POA: Diagnosis not present

## 2012-10-02 DIAGNOSIS — N2581 Secondary hyperparathyroidism of renal origin: Secondary | ICD-10-CM | POA: Diagnosis not present

## 2012-10-02 DIAGNOSIS — N186 End stage renal disease: Secondary | ICD-10-CM | POA: Diagnosis not present

## 2012-10-02 DIAGNOSIS — D509 Iron deficiency anemia, unspecified: Secondary | ICD-10-CM | POA: Diagnosis not present

## 2012-10-03 DIAGNOSIS — I5033 Acute on chronic diastolic (congestive) heart failure: Secondary | ICD-10-CM | POA: Diagnosis not present

## 2012-10-03 DIAGNOSIS — N186 End stage renal disease: Secondary | ICD-10-CM | POA: Diagnosis not present

## 2012-10-03 DIAGNOSIS — J449 Chronic obstructive pulmonary disease, unspecified: Secondary | ICD-10-CM | POA: Diagnosis not present

## 2012-10-03 DIAGNOSIS — Z992 Dependence on renal dialysis: Secondary | ICD-10-CM | POA: Diagnosis not present

## 2012-10-18 ENCOUNTER — Other Ambulatory Visit: Payer: Self-pay | Admitting: Internal Medicine

## 2012-10-18 DIAGNOSIS — N6310 Unspecified lump in the right breast, unspecified quadrant: Secondary | ICD-10-CM

## 2012-10-23 DIAGNOSIS — E1129 Type 2 diabetes mellitus with other diabetic kidney complication: Secondary | ICD-10-CM | POA: Diagnosis not present

## 2012-10-23 DIAGNOSIS — N189 Chronic kidney disease, unspecified: Secondary | ICD-10-CM | POA: Diagnosis not present

## 2012-11-01 DIAGNOSIS — N186 End stage renal disease: Secondary | ICD-10-CM | POA: Diagnosis not present

## 2012-11-04 DIAGNOSIS — N2581 Secondary hyperparathyroidism of renal origin: Secondary | ICD-10-CM | POA: Diagnosis not present

## 2012-11-04 DIAGNOSIS — D509 Iron deficiency anemia, unspecified: Secondary | ICD-10-CM | POA: Diagnosis not present

## 2012-11-04 DIAGNOSIS — D631 Anemia in chronic kidney disease: Secondary | ICD-10-CM | POA: Diagnosis not present

## 2012-11-04 DIAGNOSIS — N186 End stage renal disease: Secondary | ICD-10-CM | POA: Diagnosis not present

## 2012-11-05 ENCOUNTER — Ambulatory Visit
Admission: RE | Admit: 2012-11-05 | Discharge: 2012-11-05 | Disposition: A | Discharge status: Home / Self Care | Payer: Medicare Other | Source: Ambulatory Visit | Attending: Internal Medicine | Admitting: Internal Medicine

## 2012-11-05 DIAGNOSIS — N6310 Unspecified lump in the right breast, unspecified quadrant: Secondary | ICD-10-CM

## 2012-11-05 DIAGNOSIS — R928 Other abnormal and inconclusive findings on diagnostic imaging of breast: Secondary | ICD-10-CM | POA: Diagnosis not present

## 2012-12-01 ENCOUNTER — Other Ambulatory Visit: Payer: Self-pay | Admitting: Internal Medicine

## 2012-12-01 DIAGNOSIS — N186 End stage renal disease: Secondary | ICD-10-CM | POA: Diagnosis not present

## 2012-12-02 DIAGNOSIS — D631 Anemia in chronic kidney disease: Secondary | ICD-10-CM | POA: Diagnosis not present

## 2012-12-02 DIAGNOSIS — N2581 Secondary hyperparathyroidism of renal origin: Secondary | ICD-10-CM | POA: Diagnosis not present

## 2012-12-02 DIAGNOSIS — N186 End stage renal disease: Secondary | ICD-10-CM | POA: Diagnosis not present

## 2012-12-02 DIAGNOSIS — D509 Iron deficiency anemia, unspecified: Secondary | ICD-10-CM | POA: Diagnosis not present

## 2013-01-01 DIAGNOSIS — N186 End stage renal disease: Secondary | ICD-10-CM | POA: Diagnosis not present

## 2013-01-03 DIAGNOSIS — N2581 Secondary hyperparathyroidism of renal origin: Secondary | ICD-10-CM | POA: Diagnosis not present

## 2013-01-03 DIAGNOSIS — D509 Iron deficiency anemia, unspecified: Secondary | ICD-10-CM | POA: Diagnosis not present

## 2013-01-03 DIAGNOSIS — N186 End stage renal disease: Secondary | ICD-10-CM | POA: Diagnosis not present

## 2013-01-03 DIAGNOSIS — D631 Anemia in chronic kidney disease: Secondary | ICD-10-CM | POA: Diagnosis not present

## 2013-01-03 DIAGNOSIS — Z992 Dependence on renal dialysis: Secondary | ICD-10-CM | POA: Diagnosis not present

## 2013-01-09 DIAGNOSIS — I132 Hypertensive heart and chronic kidney disease with heart failure and with stage 5 chronic kidney disease, or end stage renal disease: Secondary | ICD-10-CM | POA: Diagnosis not present

## 2013-01-09 DIAGNOSIS — I5032 Chronic diastolic (congestive) heart failure: Secondary | ICD-10-CM | POA: Diagnosis not present

## 2013-01-09 DIAGNOSIS — N186 End stage renal disease: Secondary | ICD-10-CM | POA: Diagnosis not present

## 2013-01-09 DIAGNOSIS — Z992 Dependence on renal dialysis: Secondary | ICD-10-CM | POA: Diagnosis not present

## 2013-01-23 ENCOUNTER — Other Ambulatory Visit (HOSPITAL_COMMUNITY): Payer: Self-pay | Admitting: Interventional Radiology

## 2013-01-23 DIAGNOSIS — N2889 Other specified disorders of kidney and ureter: Secondary | ICD-10-CM

## 2013-01-29 DIAGNOSIS — E1129 Type 2 diabetes mellitus with other diabetic kidney complication: Secondary | ICD-10-CM | POA: Diagnosis not present

## 2013-01-29 DIAGNOSIS — N189 Chronic kidney disease, unspecified: Secondary | ICD-10-CM | POA: Diagnosis not present

## 2013-02-01 DIAGNOSIS — N186 End stage renal disease: Secondary | ICD-10-CM | POA: Diagnosis not present

## 2013-02-03 DIAGNOSIS — D631 Anemia in chronic kidney disease: Secondary | ICD-10-CM | POA: Diagnosis not present

## 2013-02-03 DIAGNOSIS — N2581 Secondary hyperparathyroidism of renal origin: Secondary | ICD-10-CM | POA: Diagnosis not present

## 2013-02-03 DIAGNOSIS — N186 End stage renal disease: Secondary | ICD-10-CM | POA: Diagnosis not present

## 2013-02-03 DIAGNOSIS — D509 Iron deficiency anemia, unspecified: Secondary | ICD-10-CM | POA: Diagnosis not present

## 2013-02-05 ENCOUNTER — Encounter: Payer: Self-pay | Admitting: Radiology

## 2013-02-12 ENCOUNTER — Other Ambulatory Visit: Payer: Self-pay | Admitting: Radiology

## 2013-02-12 DIAGNOSIS — N2889 Other specified disorders of kidney and ureter: Secondary | ICD-10-CM

## 2013-02-12 MED ORDER — PREDNISONE 50 MG PO TABS: ORAL_TABLET | ORAL | Status: DC

## 2013-02-12 MED ORDER — DIPHENHYDRAMINE HCL 25 MG PO CAPS: 50.0000 mg | ORAL_CAPSULE | Freq: Once | ORAL | Status: DC

## 2013-02-16 ENCOUNTER — Inpatient Hospital Stay (HOSPITAL_COMMUNITY)
Admission: EM | Admit: 2013-02-16 | Discharge: 2013-02-21 | DRG: 193 | Disposition: A | Discharge status: Home / Self Care | Payer: Medicare Other | Attending: Internal Medicine | Admitting: Internal Medicine

## 2013-02-16 ENCOUNTER — Encounter (HOSPITAL_COMMUNITY): Payer: Self-pay | Admitting: Emergency Medicine

## 2013-02-16 ENCOUNTER — Emergency Department (HOSPITAL_COMMUNITY): Payer: Medicare Other

## 2013-02-16 DIAGNOSIS — N186 End stage renal disease: Secondary | ICD-10-CM | POA: Diagnosis present

## 2013-02-16 DIAGNOSIS — I2789 Other specified pulmonary heart diseases: Secondary | ICD-10-CM | POA: Diagnosis not present

## 2013-02-16 DIAGNOSIS — I2510 Atherosclerotic heart disease of native coronary artery without angina pectoris: Secondary | ICD-10-CM

## 2013-02-16 DIAGNOSIS — Z8249 Family history of ischemic heart disease and other diseases of the circulatory system: Secondary | ICD-10-CM | POA: Diagnosis not present

## 2013-02-16 DIAGNOSIS — I272 Pulmonary hypertension, unspecified: Secondary | ICD-10-CM | POA: Diagnosis present

## 2013-02-16 DIAGNOSIS — N2581 Secondary hyperparathyroidism of renal origin: Secondary | ICD-10-CM | POA: Diagnosis not present

## 2013-02-16 DIAGNOSIS — J9601 Acute respiratory failure with hypoxia: Secondary | ICD-10-CM

## 2013-02-16 DIAGNOSIS — R569 Unspecified convulsions: Secondary | ICD-10-CM | POA: Diagnosis present

## 2013-02-16 DIAGNOSIS — Z85528 Personal history of other malignant neoplasm of kidney: Secondary | ICD-10-CM

## 2013-02-16 DIAGNOSIS — Z7982 Long term (current) use of aspirin: Secondary | ICD-10-CM

## 2013-02-16 DIAGNOSIS — F17200 Nicotine dependence, unspecified, uncomplicated: Secondary | ICD-10-CM | POA: Diagnosis present

## 2013-02-16 DIAGNOSIS — D631 Anemia in chronic kidney disease: Secondary | ICD-10-CM | POA: Diagnosis present

## 2013-02-16 DIAGNOSIS — I739 Peripheral vascular disease, unspecified: Secondary | ICD-10-CM

## 2013-02-16 DIAGNOSIS — J189 Pneumonia, unspecified organism: Principal | ICD-10-CM

## 2013-02-16 DIAGNOSIS — Z992 Dependence on renal dialysis: Secondary | ICD-10-CM | POA: Diagnosis not present

## 2013-02-16 DIAGNOSIS — Z888 Allergy status to other drugs, medicaments and biological substances status: Secondary | ICD-10-CM | POA: Diagnosis not present

## 2013-02-16 DIAGNOSIS — E785 Hyperlipidemia, unspecified: Secondary | ICD-10-CM | POA: Diagnosis present

## 2013-02-16 DIAGNOSIS — Z720 Tobacco use: Secondary | ICD-10-CM

## 2013-02-16 DIAGNOSIS — Z881 Allergy status to other antibiotic agents status: Secondary | ICD-10-CM | POA: Diagnosis not present

## 2013-02-16 DIAGNOSIS — G40309 Generalized idiopathic epilepsy and epileptic syndromes, not intractable, without status epilepticus: Secondary | ICD-10-CM

## 2013-02-16 DIAGNOSIS — I509 Heart failure, unspecified: Secondary | ICD-10-CM | POA: Diagnosis present

## 2013-02-16 DIAGNOSIS — J441 Chronic obstructive pulmonary disease with (acute) exacerbation: Secondary | ICD-10-CM | POA: Diagnosis not present

## 2013-02-16 DIAGNOSIS — Z9861 Coronary angioplasty status: Secondary | ICD-10-CM | POA: Diagnosis not present

## 2013-02-16 DIAGNOSIS — J9600 Acute respiratory failure, unspecified whether with hypoxia or hypercapnia: Secondary | ICD-10-CM | POA: Diagnosis not present

## 2013-02-16 DIAGNOSIS — F329 Major depressive disorder, single episode, unspecified: Secondary | ICD-10-CM

## 2013-02-16 DIAGNOSIS — B029 Zoster without complications: Secondary | ICD-10-CM | POA: Diagnosis present

## 2013-02-16 DIAGNOSIS — I6350 Cerebral infarction due to unspecified occlusion or stenosis of unspecified cerebral artery: Secondary | ICD-10-CM

## 2013-02-16 DIAGNOSIS — I5032 Chronic diastolic (congestive) heart failure: Secondary | ICD-10-CM | POA: Diagnosis not present

## 2013-02-16 DIAGNOSIS — N039 Chronic nephritic syndrome with unspecified morphologic changes: Secondary | ICD-10-CM

## 2013-02-16 DIAGNOSIS — I10 Essential (primary) hypertension: Secondary | ICD-10-CM | POA: Diagnosis not present

## 2013-02-16 DIAGNOSIS — G40909 Epilepsy, unspecified, not intractable, without status epilepticus: Secondary | ICD-10-CM

## 2013-02-16 DIAGNOSIS — Z885 Allergy status to narcotic agent status: Secondary | ICD-10-CM

## 2013-02-16 DIAGNOSIS — E039 Hypothyroidism, unspecified: Secondary | ICD-10-CM | POA: Diagnosis present

## 2013-02-16 DIAGNOSIS — G931 Anoxic brain damage, not elsewhere classified: Secondary | ICD-10-CM

## 2013-02-16 DIAGNOSIS — F32A Depression, unspecified: Secondary | ICD-10-CM

## 2013-02-16 DIAGNOSIS — G9340 Encephalopathy, unspecified: Secondary | ICD-10-CM

## 2013-02-16 DIAGNOSIS — R918 Other nonspecific abnormal finding of lung field: Secondary | ICD-10-CM | POA: Diagnosis not present

## 2013-02-16 DIAGNOSIS — E875 Hyperkalemia: Secondary | ICD-10-CM

## 2013-02-16 DIAGNOSIS — K219 Gastro-esophageal reflux disease without esophagitis: Secondary | ICD-10-CM

## 2013-02-16 DIAGNOSIS — Z79899 Other long term (current) drug therapy: Secondary | ICD-10-CM

## 2013-02-16 DIAGNOSIS — Z8673 Personal history of transient ischemic attack (TIA), and cerebral infarction without residual deficits: Secondary | ICD-10-CM | POA: Diagnosis not present

## 2013-02-16 DIAGNOSIS — R0602 Shortness of breath: Secondary | ICD-10-CM | POA: Diagnosis not present

## 2013-02-16 DIAGNOSIS — I120 Hypertensive chronic kidney disease with stage 5 chronic kidney disease or end stage renal disease: Secondary | ICD-10-CM | POA: Diagnosis present

## 2013-02-16 DIAGNOSIS — R5381 Other malaise: Secondary | ICD-10-CM

## 2013-02-16 DIAGNOSIS — I469 Cardiac arrest, cause unspecified: Secondary | ICD-10-CM

## 2013-02-16 DIAGNOSIS — Y95 Nosocomial condition: Secondary | ICD-10-CM

## 2013-02-16 DIAGNOSIS — R9431 Abnormal electrocardiogram [ECG] [EKG]: Secondary | ICD-10-CM

## 2013-02-16 LAB — BASIC METABOLIC PANEL
BUN: 35 mg/dL — ABNORMAL HIGH (ref 6–23)
CO2: 27 mEq/L (ref 19–32)
CREATININE: 8.3 mg/dL — AB (ref 0.50–1.10)
Calcium: 10.7 mg/dL — ABNORMAL HIGH (ref 8.4–10.5)
Chloride: 92 mEq/L — ABNORMAL LOW (ref 96–112)
GFR, EST AFRICAN AMERICAN: 5 mL/min — AB (ref >90)
GFR, EST NON AFRICAN AMERICAN: 4 mL/min — AB (ref >90)
Glucose, Bld: 97 mg/dL (ref 70–99)
Potassium: 4.2 mEq/L (ref 3.7–5.3)
Sodium: 137 mEq/L (ref 137–147)

## 2013-02-16 LAB — CBC
HCT: 30.7 % — ABNORMAL LOW (ref 36.0–46.0)
Hemoglobin: 10.1 g/dL — ABNORMAL LOW (ref 12.0–15.0)
MCH: 26.4 pg (ref 26.0–34.0)
MCHC: 32.9 g/dL (ref 30.0–36.0)
MCV: 80.2 fL (ref 78.0–100.0)
PLATELETS: 188 10*3/uL (ref 150–400)
RBC: 3.83 MIL/uL — ABNORMAL LOW (ref 3.87–5.11)
RDW: 16.6 % — AB (ref 11.5–15.5)
WBC: 5.1 10*3/uL (ref 4.0–10.5)

## 2013-02-16 LAB — POCT I-STAT TROPONIN I: Troponin i, poc: 0.07 ng/mL (ref 0.00–0.08)

## 2013-02-16 LAB — LACTIC ACID, PLASMA: Lactic Acid, Venous: 1.2 mmol/L (ref 0.5–2.2)

## 2013-02-16 LAB — PRO B NATRIURETIC PEPTIDE: PRO B NATRI PEPTIDE: 31758 pg/mL — AB (ref 0–125)

## 2013-02-16 MED ORDER — CARVEDILOL 25 MG PO TABS
25.0000 mg | ORAL_TABLET | Freq: Two times a day (BID) | ORAL | Status: DC
  Administered 2013-02-17 – 2013-02-21 (×9): 25 mg via ORAL
  Filled 2013-02-16 (×11): qty 1

## 2013-02-16 MED ORDER — NITROGLYCERIN 0.4 MG SL SUBL
0.4000 mg | SUBLINGUAL_TABLET | SUBLINGUAL | Status: DC | PRN
  Administered 2013-02-17: 0.4 mg via SUBLINGUAL
  Filled 2013-02-16: qty 25

## 2013-02-16 MED ORDER — PREDNISONE 20 MG PO TABS
60.0000 mg | ORAL_TABLET | Freq: Once | ORAL | Status: AC
  Administered 2013-02-16: 60 mg via ORAL
  Filled 2013-02-16: qty 3

## 2013-02-16 MED ORDER — METHYLPREDNISOLONE SODIUM SUCC 40 MG IJ SOLR
40.0000 mg | INTRAMUSCULAR | Status: DC
  Administered 2013-02-16: 40 mg via INTRAVENOUS
  Filled 2013-02-16 (×2): qty 1

## 2013-02-16 MED ORDER — IPRATROPIUM BROMIDE 0.02 % IN SOLN
0.5000 mg | Freq: Once | RESPIRATORY_TRACT | Status: AC
  Administered 2013-02-16: 0.5 mg via RESPIRATORY_TRACT
  Filled 2013-02-16: qty 2.5

## 2013-02-16 MED ORDER — LEVETIRACETAM 500 MG PO TABS
500.0000 mg | ORAL_TABLET | Freq: Every day | ORAL | Status: DC
  Administered 2013-02-16 – 2013-02-21 (×6): 500 mg via ORAL
  Filled 2013-02-16 (×6): qty 1

## 2013-02-16 MED ORDER — CINACALCET HCL 30 MG PO TABS
60.0000 mg | ORAL_TABLET | Freq: Every day | ORAL | Status: DC
  Administered 2013-02-16 – 2013-02-20 (×5): 60 mg via ORAL
  Filled 2013-02-16 (×6): qty 2

## 2013-02-16 MED ORDER — DEXTROSE 5 % IV SOLN
2.0000 g | Freq: Once | INTRAVENOUS | Status: AC
  Administered 2013-02-16: 2 g via INTRAVENOUS

## 2013-02-16 MED ORDER — DIPHENHYDRAMINE HCL 50 MG/ML IJ SOLN
25.0000 mg | Freq: Once | INTRAMUSCULAR | Status: AC
  Administered 2013-02-16: 25 mg via INTRAVENOUS
  Filled 2013-02-16: qty 1

## 2013-02-16 MED ORDER — ACETAMINOPHEN 325 MG PO TABS
650.0000 mg | ORAL_TABLET | Freq: Four times a day (QID) | ORAL | Status: DC | PRN
  Administered 2013-02-18 – 2013-02-20 (×2): 650 mg via ORAL
  Filled 2013-02-16 (×2): qty 2

## 2013-02-16 MED ORDER — AMLODIPINE BESYLATE 10 MG PO TABS
10.0000 mg | ORAL_TABLET | Freq: Every day | ORAL | Status: DC
  Administered 2013-02-16 – 2013-02-20 (×5): 10 mg via ORAL
  Filled 2013-02-16 (×6): qty 1

## 2013-02-16 MED ORDER — SIMVASTATIN 10 MG PO TABS
10.0000 mg | ORAL_TABLET | Freq: Every day | ORAL | Status: DC
  Administered 2013-02-17 – 2013-02-20 (×4): 10 mg via ORAL
  Filled 2013-02-16 (×5): qty 1

## 2013-02-16 MED ORDER — DIPHENHYDRAMINE HCL 25 MG PO CAPS
75.0000 mg | ORAL_CAPSULE | Freq: Every day | ORAL | Status: DC | PRN
  Administered 2013-02-17: 75 mg via ORAL
  Filled 2013-02-16: qty 3

## 2013-02-16 MED ORDER — ALBUTEROL SULFATE (2.5 MG/3ML) 0.083% IN NEBU
5.0000 mg | INHALATION_SOLUTION | Freq: Once | RESPIRATORY_TRACT | Status: AC
  Administered 2013-02-16: 5 mg via RESPIRATORY_TRACT
  Filled 2013-02-16: qty 6

## 2013-02-16 MED ORDER — ASPIRIN EC 81 MG PO TBEC
81.0000 mg | DELAYED_RELEASE_TABLET | Freq: Every day | ORAL | Status: DC
  Administered 2013-02-17 – 2013-02-21 (×5): 81 mg via ORAL
  Filled 2013-02-16 (×5): qty 1

## 2013-02-16 MED ORDER — HYDRALAZINE HCL 20 MG/ML IJ SOLN
20.0000 mg | Freq: Once | INTRAMUSCULAR | Status: AC
  Administered 2013-02-16: 20 mg via INTRAVENOUS
  Filled 2013-02-16: qty 1

## 2013-02-16 MED ORDER — ONDANSETRON HCL 4 MG/2ML IJ SOLN: 4.0000 mg | Freq: Four times a day (QID) | INTRAMUSCULAR | Status: DC | PRN

## 2013-02-16 MED ORDER — BUDESONIDE 0.25 MG/2ML IN SUSP
0.2500 mg | Freq: Two times a day (BID) | RESPIRATORY_TRACT | Status: DC
  Administered 2013-02-17 – 2013-02-21 (×8): 0.25 mg via RESPIRATORY_TRACT
  Filled 2013-02-16 (×13): qty 2

## 2013-02-16 MED ORDER — VANCOMYCIN HCL IN DEXTROSE 1-5 GM/200ML-% IV SOLN
1000.0000 mg | Freq: Once | INTRAVENOUS | Status: AC
  Administered 2013-02-16: 1000 mg via INTRAVENOUS
  Filled 2013-02-16: qty 200

## 2013-02-16 MED ORDER — SODIUM CHLORIDE 0.9 % IJ SOLN
3.0000 mL | Freq: Two times a day (BID) | INTRAMUSCULAR | Status: DC
  Administered 2013-02-17 – 2013-02-18 (×2): 3 mL via INTRAVENOUS

## 2013-02-16 MED ORDER — ALBUTEROL SULFATE (2.5 MG/3ML) 0.083% IN NEBU
2.5000 mg | INHALATION_SOLUTION | RESPIRATORY_TRACT | Status: DC
  Administered 2013-02-17: 2.5 mg via RESPIRATORY_TRACT
  Filled 2013-02-16: qty 3

## 2013-02-16 MED ORDER — ONDANSETRON HCL 4 MG PO TABS: 4.0000 mg | ORAL_TABLET | Freq: Four times a day (QID) | ORAL | Status: DC | PRN

## 2013-02-16 MED ORDER — IPRATROPIUM BROMIDE 0.02 % IN SOLN: 0.5000 mg | Freq: Four times a day (QID) | RESPIRATORY_TRACT | Status: DC

## 2013-02-16 MED ORDER — PANTOPRAZOLE SODIUM 40 MG PO TBEC
40.0000 mg | DELAYED_RELEASE_TABLET | Freq: Every day | ORAL | Status: DC
  Administered 2013-02-17 – 2013-02-18 (×2): 40 mg via ORAL
  Filled 2013-02-16 (×4): qty 1

## 2013-02-16 MED ORDER — HYDROCORTISONE 1 % EX CREA
1.0000 "application " | TOPICAL_CREAM | Freq: Every day | CUTANEOUS | Status: DC | PRN
  Administered 2013-02-17: 1 via TOPICAL
  Filled 2013-02-16: qty 28

## 2013-02-16 MED ORDER — ALBUTEROL SULFATE HFA 108 (90 BASE) MCG/ACT IN AERS: 2.0000 | INHALATION_SPRAY | RESPIRATORY_TRACT | Status: DC | PRN

## 2013-02-16 MED ORDER — SODIUM CHLORIDE 0.9 % IJ SOLN
3.0000 mL | Freq: Two times a day (BID) | INTRAMUSCULAR | Status: DC
  Administered 2013-02-17 – 2013-02-18 (×3): 3 mL via INTRAVENOUS

## 2013-02-16 MED ORDER — LEVOTHYROXINE SODIUM 88 MCG PO TABS
88.0000 ug | ORAL_TABLET | Freq: Every day | ORAL | Status: DC
  Administered 2013-02-17 – 2013-02-21 (×5): 88 ug via ORAL
  Filled 2013-02-16 (×6): qty 1

## 2013-02-16 MED ORDER — ACETAMINOPHEN 650 MG RE SUPP: 650.0000 mg | Freq: Four times a day (QID) | RECTAL | Status: DC | PRN

## 2013-02-16 MED ORDER — ALBUTEROL SULFATE (2.5 MG/3ML) 0.083% IN NEBU
2.5000 mg | INHALATION_SOLUTION | RESPIRATORY_TRACT | Status: DC | PRN
  Administered 2013-02-17 – 2013-02-18 (×2): 2.5 mg via RESPIRATORY_TRACT
  Filled 2013-02-16: qty 3

## 2013-02-16 MED ORDER — OSELTAMIVIR PHOSPHATE 30 MG PO CAPS
30.0000 mg | ORAL_CAPSULE | ORAL | Status: DC
  Administered 2013-02-16 – 2013-02-17 (×2): 30 mg via ORAL
  Filled 2013-02-16 (×2): qty 1

## 2013-02-16 MED ORDER — TRAZODONE HCL 50 MG PO TABS
50.0000 mg | ORAL_TABLET | Freq: Every evening | ORAL | Status: DC | PRN
  Administered 2013-02-17 – 2013-02-20 (×5): 50 mg via ORAL
  Filled 2013-02-16 (×6): qty 1

## 2013-02-16 MED ORDER — SEVELAMER CARBONATE 800 MG PO TABS
800.0000 mg | ORAL_TABLET | Freq: Three times a day (TID) | ORAL | Status: DC
  Administered 2013-02-17 (×2): 800 mg via ORAL
  Filled 2013-02-16 (×4): qty 1

## 2013-02-16 MED ORDER — HYDRALAZINE HCL 50 MG PO TABS
50.0000 mg | ORAL_TABLET | Freq: Three times a day (TID) | ORAL | Status: DC
  Administered 2013-02-16: 50 mg via ORAL
  Filled 2013-02-16 (×4): qty 1

## 2013-02-16 NOTE — ED Provider Notes (Signed)
Medical screening examination/treatment/procedure(s) were performed by non-physician practitioner and as supervising physician I was immediately available for consultation/collaboration.  EKG Interpretation   None         Charles B. Karle Starch, MD 02/16/13 2134

## 2013-02-16 NOTE — ED Notes (Signed)
Admitting MD at bedside.

## 2013-02-16 NOTE — ED Notes (Signed)
Pt reports itching. No rash noted. No swelling noted.

## 2013-02-16 NOTE — ED Provider Notes (Signed)
CSN: [id number]     Arrival date & time 02/16/13  1915 History   First MD Initiated Contact with Patient 02/16/13 1933     Chief Complaint  Patient presents with  . Shortness of Breath     (Consider location/radiation/quality/duration/timing/severity/associated sxs/prior Treatment) HPI  Patient to the ER by EMS.   [name] is a 68 y.o.female with a significant PMH of seizures, COPD, hypothyroidism, hypertension, CHF, kidney cancer- dialysis patient w/ ESRD ( last dialysis on Friday, goes 3 times a week) presents to the ER with complaints of shortness of breath and cough. She has been short of breath since Monday, has been using her inhalers at home with significant relief. Today however, she did not get relief with her breathing treatments.  In the room her oxygen is 87% on room air. She denies using home oxygen. She is tachypnic. She is able to speak in full sentences and denies being too tired to breath. She was last admitted in the hospital in September 2014 and denies ever being intubated for her COPD. PT has a low grade temp. Her BP is 206/178, she says that it is usually significantly elevated and that she has not yet taken her BP medications this evening. Pt coughing up sputum thorough interview.  Denies weakness, headache, chest pain, dizziness, sore throat, ear pain, n/v/d, abdominal pains, lowe extremity swelling.    Past Medical History  Diagnosis Date  . CAD (coronary artery disease)     stent to RCA  . CVA (cerebral infarction) 2003    no apparent residual  . Hypothyroidism   . PVD (peripheral vascular disease)   . Hyperlipidemia   . Positive PPD     completed rifampin  . Diastolic congestive heart failure   . Hypertension   . COPD (chronic obstructive pulmonary disease) t  . Cancer     clear cell cancer, kidney  . Complication of anesthesia 12/2010    pt is very confused, with AMS with anesthesia  . CVA 07/27/2008    CVA affected cognition and memory per  family, no focal deficits.    . ESRD 07/27/2008    ESRD due to HTN and NSAID's, started hemodialysis in 2005 in [place], [place]. Went to [facility] from 2010 to 2012 and since 2012 has been getting dialysis at [facility] on [address] in [place] on a MWF schedule. First access with RUA AVG placed in [place]. Next and current access was LUA AVG placed by Dr. Lucky Cowboy in [place] in or around 2012. Has had 2 or 3 procedures on that graft since placed per family. She gets her access work done here in [place] now. She is allergic to heparin and does not get any heparin at dialysis; she had an allergic reaction apparently when in ICU in the past.      . Encephalopathy   . Seizures   . Dyslipidemia   . Chronic renal insufficiency     On hemodialysis  . Arthritis of shoulder     Bilateral  . Depression    Past Surgical History  Procedure Laterality Date  . Abdominal hysterectomy    . Cholecystectomy    . D&cs    . Right ankle repair    . Left nephrectomy    . Vascular surgery  11/2010    graft inserted to left arm  . Nephrectomy Left     Malignant tumor  . Cataract extraction Bilateral   . Tonsillectomy     Family History  Problem Relation Age of Onset  . Heart disease Father   . Hypertension Mother   . Dementia Mother   . Coronary artery disease Sister   . Heart attack Sister   . Hypertension Brother    History  Substance Use Topics  . Smoking status: Current Every Day Smoker -- 2.00 packs/day for 53 years    Types: Cigarettes  . Smokeless tobacco: Never Used     Comment: 1/2 ppd since age 9. has stopped since hospitalization but wants to go back  . Alcohol Use: Yes   OB History   Grav Para Term Preterm Abortions TAB SAB Ect Mult Living                 Review of Systems  The patient denies anorexia,  weight loss, vision loss, decreased hearing, hoarseness, chest pain, syncope, peripheral edema, balance deficits, hemoptysis, abdominal pain, melena, hematochezia,  severe indigestion/heartburn, hematuria, incontinence, genital sores, muscle weakness, suspicious skin lesions, transient blindness, difficulty walking, depression, unusual weight change, abnormal bleeding, enlarged lymph nodes, angioedema, and breast masses.   Allergies  Heparin; Iohexol; Phenytoin; Wellbutrin; Ampicillin; Meperidine hcl; Morphine; Penicillins; Valproic acid and related; Ace inhibitors; and Pentazocine lactate  Home Medications   Current Outpatient Rx  Name  Route  Sig  Dispense  Refill  . acetaminophen (TYLENOL) 500 MG tablet   Oral   Take 500 mg by mouth every 6 (six) hours as needed for pain.         [name] albuterol-ipratropium (COMBIVENT) 18-103 MCG/ACT inhaler   Inhalation   Inhale 2 puffs into the lungs every 6 (six) hours as needed. For shortness of breath         . amLODipine (NORVASC) 10 MG tablet   Oral   Take 10 mg by mouth at bedtime.          [name] aspirin EC 81 MG tablet   Oral   Take 81 mg by mouth daily.         . carvedilol (COREG) 25 MG tablet   Oral   Take 25 mg by mouth 2 (two) times daily with a meal.         . cinacalcet (SENSIPAR) 30 MG tablet   Oral   Take 60 mg by mouth at bedtime.          . diphenhydrAMINE (BENADRYL) 25 MG tablet   Oral   Take 75 mg by mouth daily as needed for itching (Takes prior to dialysis).         . Fluticasone-Salmeterol (ADVAIR DISKUS) 250-50 MCG/DOSE AEPB   Inhalation   Inhale 1 puff into the lungs 2 (two) times daily.   60 each   1   . hydrALAZINE (APRESOLINE) 50 MG tablet   Oral   Take 50 mg by mouth 3 (three) times daily.         . hydrocortisone cream 1 %   Topical   Apply 1 application topically daily as needed for itching.         . levETIRAcetam (KEPPRA) 500 MG tablet   Oral   Take 500 mg by mouth daily at 8 pm.         . levothyroxine (SYNTHROID, LEVOTHROID) 88 MCG tablet   Oral   Take 88 mcg by mouth daily before breakfast.         . Phenylephrine-APAP-Guaifenesin  (MUCINEX FAST-MAX COLD & SINUS) 10-650-400 MG/20ML LIQD   Oral   Take 30 mLs by mouth daily as  needed (for cold).         . pravastatin (PRAVACHOL) 20 MG tablet   Oral   Take 20 mg by mouth daily.         . sevelamer (RENAGEL) 800 MG tablet   Oral   Take 1,600-3,200 mg by mouth See admin instructions. Take 4 tablets in the morning, at lunch, and 4 tablets dinner. Takes 1,600 mg with snacks twice a day.         . traZODone (DESYREL) 50 MG tablet   Oral   Take 50 mg by mouth at bedtime as needed for sleep.          [name] NITROSTAT 0.4 MG SL tablet   Sublingual   Place 0.4 mg under the tongue every 5 (five) minutes as needed. For chest pain.          BP 191/89  Pulse 83  Temp(Src) 99.3 F (37.4 C) (Oral)  Resp 22  SpO2 97% Physical Exam  Nursing note and vitals reviewed. Constitutional: She appears well-developed and well-nourished. She appears distressed.  HENT:  Head: Normocephalic and atraumatic.  Eyes: EOM are normal. Pupils are equal, round, and reactive to light.  Neck: Normal range of motion. Neck supple.  Cardiovascular: Regular rhythm.  Tachycardia present.   Pulmonary/Chest: Tachypnea noted. She is in respiratory distress. She has decreased breath sounds in the right lower field and the left lower field. She has rhonchi.  Abdominal: Soft. There is no tenderness. There is no rebound.  Musculoskeletal:  No obvious lower extremity swelling. No pitting edema.  Neurological: She is alert.  Skin: Skin is warm and dry.    ED Course  Procedures (including critical care time) Labs Review Labs Reviewed  CBC - Abnormal; Notable for the following:    RBC 3.83 (*)    Hemoglobin 10.1 (*)    HCT 30.7 (*)    RDW 16.6 (*)    All other components within normal limits  BASIC METABOLIC PANEL - Abnormal; Notable for the following:    Chloride 92 (*)    BUN 35 (*)    Creatinine, Ser 8.30 (*)    Calcium 10.7 (*)    GFR calc non Af Amer 4 (*)    GFR calc Af Amer 5 (*)     All other components within normal limits  PRO B NATRIURETIC PEPTIDE - Abnormal; Notable for the following:    Pro B Natriuretic peptide (BNP) 31758.0 (*)    All other components within normal limits  LACTIC ACID, PLASMA  INFLUENZA PANEL BY PCR (TYPE A & B, H1N1)  POCT I-STAT TROPONIN I   Imaging Review Dg Chest Port 1 View  02/16/2013   CLINICAL DATA:  Shortness of breath  EXAM: PORTABLE CHEST - 1 VIEW  COMPARISON:  DG CHEST 2 VIEW dated 09/23/2012  FINDINGS: Low lung volumes. Increased density projects within the right lung base. The cardiac silhouette is enlarged. Atherosclerotic calcifications appreciated within the aorta. The osseous structures unremarkable.  IMPRESSION: Right lower lobe infiltrate. Stable cardiomegaly with technique taken into consideration.   Electronically Signed   By: Margaree Mackintosh M.D.   On: 02/16/2013 19:56    EKG Interpretation   None       MDM   Final diagnoses:  HAP (hospital-acquired pneumonia)   7:57pm  Discussed case with Dr. Karle Starch, will treat BP of 209/178 with IV Hydralazine 20mg . Patient takes 50 PO Hydralazine at home. Her symptoms are more consistent with URI but will evaluate  cardiac, pulmonary and renal pathology as well with a generalized work-up.  Albuterol and atrovent Neb as well as prednisone 60mg  given in ED. Pt started on [place] 3 L and oxygen responded well up to 92 %.  8:35 pm Patient has responded well to breathing treatment and prednisone. Feeling much better but still tachypnic with increased work rate of breathing. Chest xray shows right lower lung infiltrate consistent with pneumonia, will admit.  Patient started on cefipeme and vancomycin for HAP  Inpatient, [place], team 10, tele    Linus Mako, PA-C 02/16/13 2104

## 2013-02-16 NOTE — H&P (Addendum)
[facility] History and Physical  [name] [id number] DOB: 1945-08-12 DOA: 02/16/2013  Referring physician: ER physician. PCP: [name], MD   Chief Complaint: Shortness of breath.  HPI: [name] is a 68 y.o. female history of ESRD on hemodialysis on Monday Wednesday and Friday presents to the ER with complaints of increasing shortness of breath and wheezing with productive cough. Patient has been having shortness of breath the last one week. Shortness of breath was present even at rest. Denies any associated chest pain fever chills nausea vomiting diaphoresis. Patient was having nonproductive cough last one week. Since yesterday patient has been having some productive cough with wheezing. At this point patient decided to come to the ER. In the ER patient was found to be wheezing and was given IV Solu-Medrol and nebulizers. Chest x-ray showing possibility of pneumonia and patient has been started antibiotics and admitted for further management.    Review of Systems: As presented in the history of presenting illness, rest negative.  Past Medical History  Diagnosis Date  . CAD (coronary artery disease)     stent to RCA  . CVA (cerebral infarction) 2003    no apparent residual  . Hypothyroidism   . PVD (peripheral vascular disease)   . Hyperlipidemia   . Positive PPD     completed rifampin  . Diastolic congestive heart failure   . Hypertension   . COPD (chronic obstructive pulmonary disease) t  . Cancer     clear cell cancer, kidney  . Complication of anesthesia 12/2010    pt is very confused, with AMS with anesthesia  . CVA 07/27/2008    CVA affected cognition and memory per family, no focal deficits.    . ESRD 07/27/2008    ESRD due to HTN and NSAID's, started hemodialysis in 2005 in [place], [place]. Went to [facility] from 2010 to 2012 and since 2012 has been getting dialysis at [facility] on [address] in [place] on a MWF schedule. First access  with RUA AVG placed in [place]. Next and current access was LUA AVG placed by Dr. Lucky Cowboy in [place] in or around 2012. Has had 2 or 3 procedures on that graft since placed per family. She gets her access work done here in [place] now. She is allergic to heparin and does not get any heparin at dialysis; she had an allergic reaction apparently when in ICU in the past.      . Encephalopathy   . Seizures   . Dyslipidemia   . Chronic renal insufficiency     On hemodialysis  . Arthritis of shoulder     Bilateral  . Depression    Past Surgical History  Procedure Laterality Date  . Abdominal hysterectomy    . Cholecystectomy    . D&cs    . Right ankle repair    . Left nephrectomy    . Vascular surgery  11/2010    graft inserted to left arm  . Nephrectomy Left     Malignant tumor  . Cataract extraction Bilateral   . Tonsillectomy    . Cardiac catheterization     Social History:  reports that she has been smoking Cigarettes.  She has a 106 pack-year smoking history. She has never used smokeless tobacco. She reports that she drinks alcohol. She reports that she does not use illicit drugs. Where does patient live home. Can patient participate in ADLs? Yes.  Allergies  Allergen Reactions  . Heparin Other (See Comments)  MDs told her not to take after reaction in ICU  . Iohexol Swelling and Other (See Comments)    1970s; passed out and had facial/tongue swelling.  Requires 13-hour prep with prednisone and benadryl  . Phenytoin Other (See Comments)    Had reaction while in ICU; doesn't know.  MDs told her not to take ever again.  . Wellbutrin [Bupropion] Other (See Comments)    seizures  . Ampicillin Hives and Rash  . Meperidine Hcl Swelling and Rash    Makes tongue swell  . Morphine Rash  . Penicillins Rash  . Valproic Acid And Related Other (See Comments)    Confusion   . Ace Inhibitors Rash  . Pentazocine Lactate Other (See Comments)    Patient does not remember reaction  to this med (Talwin).     Family History:  Family History  Problem Relation Age of Onset  . Heart disease Father   . Hypertension Mother   . Dementia Mother   . Coronary artery disease Sister   . Heart attack Sister   . Hypertension Brother       Prior to Admission medications   Medication Sig Start Date End Date Taking? Authorizing Provider  acetaminophen (TYLENOL) 500 MG tablet Take 500 mg by mouth every 6 (six) hours as needed for pain.   Yes Historical Provider, MD  albuterol-ipratropium (COMBIVENT) 18-103 MCG/ACT inhaler Inhale 2 puffs into the lungs every 6 (six) hours as needed. For shortness of breath   Yes Historical Provider, MD  amLODipine (NORVASC) 10 MG tablet Take 10 mg by mouth at bedtime.    Yes Historical Provider, MD  aspirin EC 81 MG tablet Take 81 mg by mouth daily.   Yes Historical Provider, MD  carvedilol (COREG) 25 MG tablet Take 25 mg by mouth 2 (two) times daily with a meal.   Yes Historical Provider, MD  cinacalcet (SENSIPAR) 30 MG tablet Take 60 mg by mouth at bedtime.    Yes Historical Provider, MD  diphenhydrAMINE (BENADRYL) 25 MG tablet Take 75 mg by mouth daily as needed for itching (Takes prior to dialysis).   Yes Historical Provider, MD  Fluticasone-Salmeterol (ADVAIR DISKUS) 250-50 MCG/DOSE AEPB Inhale 1 puff into the lungs 2 (two) times daily. 09/26/12  Yes Kelvin Cellar, MD  hydrALAZINE (APRESOLINE) 50 MG tablet Take 50 mg by mouth 3 (three) times daily.   Yes Historical Provider, MD  hydrocortisone cream 1 % Apply 1 application topically daily as needed for itching.   Yes Historical Provider, MD  levETIRAcetam (KEPPRA) 500 MG tablet Take 500 mg by mouth daily at 8 pm.   Yes Historical Provider, MD  levothyroxine (SYNTHROID, LEVOTHROID) 88 MCG tablet Take 88 mcg by mouth daily before breakfast.   Yes Historical Provider, MD  Phenylephrine-APAP-Guaifenesin (MUCINEX FAST-MAX COLD & SINUS) 10-650-400 MG/20ML LIQD Take 30 mLs by mouth daily as needed (for  cold).   Yes Historical Provider, MD  pravastatin (PRAVACHOL) 20 MG tablet Take 20 mg by mouth daily.   Yes Historical Provider, MD  sevelamer (RENAGEL) 800 MG tablet Take 1,600-3,200 mg by mouth See admin instructions. Take 4 tablets in the morning, at lunch, and 4 tablets dinner. Takes 1,600 mg with snacks twice a day.   Yes Historical Provider, MD  traZODone (DESYREL) 50 MG tablet Take 50 mg by mouth at bedtime as needed for sleep.    Yes Historical Provider, MD  NITROSTAT 0.4 MG SL tablet Place 0.4 mg under the tongue every 5 (five) minutes as  needed. For chest pain. 09/16/10   Historical Provider, MD    Physical Exam: Filed Vitals:   02/16/13 2130 02/16/13 2135 02/16/13 2140 02/16/13 2200  BP: 177/71  177/71 181/76  Pulse: 89  85 93  Temp:      TempSrc:      Resp: 27  22 19   SpO2: 92% 94% 99% 93%     General: Well-developed well-nourished.  Eyes: Anicteric no pallor.  ENT: No discharge from the ears nose mouth.  Neck: No mass felt.  Cardiovascular: S1-S2 heard.  Respiratory: Mild expiratory wheeze no crepitations.  Abdomen: Soft nontender bowel sounds present. No guarding no rigidity.  Skin: No rash.  Musculoskeletal: No edema.  Psychiatric: Appears normal.  Neurologic: Alert awake oriented to time place and person. Moves all extremities.  Labs on Admission:  Basic Metabolic Panel:  Recent Labs Lab 02/16/13 1945  NA 137  K 4.2  CL 92*  CO2 27  GLUCOSE 97  BUN 35*  CREATININE 8.30*  CALCIUM 10.7*   Liver Function Tests: No results found for this basename: AST, ALT, ALKPHOS, BILITOT, PROT, ALBUMIN,  in the last 168 hours No results found for this basename: LIPASE, AMYLASE,  in the last 168 hours No results found for this basename: AMMONIA,  in the last 168 hours CBC:  Recent Labs Lab 02/16/13 1945  WBC 5.1  HGB 10.1*  HCT 30.7*  MCV 80.2  PLT 188   Cardiac Enzymes: No results found for this basename: CKTOTAL, CKMB, CKMBINDEX, TROPONINI,  in  the last 168 hours  BNP (last 3 results)  Recent Labs  09/23/12 0450 02/16/13 1945  PROBNP 10221.0* 31758.0*   CBG: No results found for this basename: GLUCAP,  in the last 168 hours  Radiological Exams on Admission: Dg Chest Port 1 View  02/16/2013   CLINICAL DATA:  Shortness of breath  EXAM: PORTABLE CHEST - 1 VIEW  COMPARISON:  DG CHEST 2 VIEW dated 09/23/2012  FINDINGS: Low lung volumes. Increased density projects within the right lung base. The cardiac silhouette is enlarged. Atherosclerotic calcifications appreciated within the aorta. The osseous structures unremarkable.  IMPRESSION: Right lower lobe infiltrate. Stable cardiomegaly with technique taken into consideration.   Electronically Signed   By: Margaree Mackintosh M.D.   On: 02/16/2013 19:56    EKG: Independently reviewed. Normal sinus rhythm.  Assessment/Plan Principal Problem:   HAP (hospital-acquired pneumonia) Active Problems:   CAD   Chronic diastolic heart failure   ESRD   COPD exacerbation   Pneumonia   1. Pneumonia - patient has been placed on vancomycin and cefepime to treat for health care associated pneumonia. I have added Tamiflu to treat empirically for influenza (renal dosing). Check influenza PCR. 2. COPD exacerbation - continue with IV steroids Pulmicort nebulizer. Slowly taper to oral prednisone. 3. Hypertension uncontrolled - in addition to home medications I have placed patient on when necessary IV hydralazine for systolic blood pressure more than 160. I think patient's blood pressure will improve with dialysis. 4. CAD status post stenting - denies any chest pain. Shortness of breath most likely from COPD. 5. ESRD on hemodialysis on Monday Wednesday and Friday - patient does not look to be in obvious fluid overload. I have left a message for dialysis at [phone]. 6. History of diastolic CHF last EF measured in 2013 was 50-55% with 2-D echo showing severe pulmonary hypertension. 7. Anemia - probably  from ESRD. Follow CBC. 8. History of seizures - continue present medications. 9. Tobacco abuse -  patient advised to quit smoking. Patient states he has not smoked since last Monday.  I have reviewed patient's old charts and labs.  Code Status:  Full code.  Family Communication:  Patient's family at the bedside.  Disposition Plan:  Admit to inpatient.    Sarah Achee N. [facility] Pager [phone].  If 7PM-7AM, please contact night-coverage [email or web address] Password [facility] 02/16/2013, 10:23 PM

## 2013-02-16 NOTE — ED Notes (Addendum)
History of COPD.  Reports increased sob since Monday.  Non-productive cough x 4 days. Denies pain.  Speaking in complete sentences.  Taking Mucinex and using inhalers without relief.

## 2013-02-16 NOTE — ED Notes (Signed)
Md paged about continued itching. Order to follow.

## 2013-02-16 NOTE — Progress Notes (Signed)
ANTIBIOTIC CONSULT NOTE - INITIAL  Pharmacy Consult for cefepime Indication: HCAP  Allergies  Allergen Reactions  . Heparin Other (See Comments)    MDs told her not to take after reaction in ICU  . Iohexol Swelling and Other (See Comments)    1970s; passed out and had facial/tongue swelling.  Requires 13-hour prep with prednisone and benadryl  . Phenytoin Other (See Comments)    Had reaction while in ICU; doesn't know.  MDs told her not to take ever again.  . Wellbutrin [Bupropion] Other (See Comments)    seizures  . Ampicillin Hives and Rash  . Meperidine Hcl Swelling and Rash    Makes tongue swell  . Morphine Rash  . Penicillins Rash  . Valproic Acid And Related Other (See Comments)    Confusion   . Ace Inhibitors Rash  . Pentazocine Lactate Other (See Comments)    Patient does not remember reaction to this med (Talwin).     Vital Signs: Temp: 99.3 F (37.4 C) (02/15 1946) Temp src: Oral (02/15 1946) BP: 191/89 mmHg (02/15 2045) Pulse Rate: 83 (02/15 2045) Intake/Output from previous day:   Intake/Output from this shift:    Labs:  Recent Labs  02/16/13 1945  WBC 5.1  HGB 10.1*  PLT 188  CREATININE 8.30*   The CrCl is unknown because both a height and weight (above a minimum accepted value) are required for this calculation. No results found for this basename: VANCOTROUGH, VANCOPEAK, VANCORANDOM, GENTTROUGH, GENTPEAK, GENTRANDOM, TOBRATROUGH, TOBRAPEAK, TOBRARND, AMIKACINPEAK, AMIKACINTROU, AMIKACIN,  in the last 72 hours   Microbiology: No results found for this or any previous visit (from the past 720 hour(s)).  Medical History: Past Medical History  Diagnosis Date  . CAD (coronary artery disease)     stent to RCA  . CVA (cerebral infarction) 2003    no apparent residual  . Hypothyroidism   . PVD (peripheral vascular disease)   . Hyperlipidemia   . Positive PPD     completed rifampin  . Diastolic congestive heart failure   . Hypertension   .  COPD (chronic obstructive pulmonary disease) t  . Cancer     clear cell cancer, kidney  . Complication of anesthesia 12/2010    pt is very confused, with AMS with anesthesia  . CVA 07/27/2008    CVA affected cognition and memory per family, no focal deficits.    . ESRD 07/27/2008    ESRD due to HTN and NSAID's, started hemodialysis in 2005 in [place], [place]. Went to [facility] from 2010 to 2012 and since 2012 has been getting dialysis at [facility] on [address] in [place] on a MWF schedule. First access with RUA AVG placed in [place]. Next and current access was LUA AVG placed by Dr. Lucky Cowboy in [place] in or around 2012. Has had 2 or 3 procedures on that graft since placed per family. She gets her access work done here in [place] now. She is allergic to heparin and does not get any heparin at dialysis; she had an allergic reaction apparently when in ICU in the past.      . Encephalopathy   . Seizures   . Dyslipidemia   . Chronic renal insufficiency     On hemodialysis  . Arthritis of shoulder     Bilateral  . Depression      Assessment: 68 yo female here with SOB and to begin cefepime for HCAP. Patient noted with ESRD and last HD on Friday.  Noted vancomycin 1000mg  IV ordered in ED  2/15 cefepime>>  Plan:  -Cefepime 2gm IV now then will follow for HD plans -Will follow plans for vancomycin -Will follow renal function, cultures and clinical progress  Hildred Laser, Pharm D 02/16/2013 9:27 PM

## 2013-02-17 DIAGNOSIS — I10 Essential (primary) hypertension: Secondary | ICD-10-CM

## 2013-02-17 DIAGNOSIS — R569 Unspecified convulsions: Secondary | ICD-10-CM

## 2013-02-17 DIAGNOSIS — I5032 Chronic diastolic (congestive) heart failure: Secondary | ICD-10-CM

## 2013-02-17 DIAGNOSIS — G40909 Epilepsy, unspecified, not intractable, without status epilepticus: Secondary | ICD-10-CM

## 2013-02-17 LAB — BASIC METABOLIC PANEL
BUN: 41 mg/dL — ABNORMAL HIGH (ref 6–23)
CO2: 25 mEq/L (ref 19–32)
Calcium: 10.3 mg/dL (ref 8.4–10.5)
Chloride: 93 mEq/L — ABNORMAL LOW (ref 96–112)
Creatinine, Ser: 9.09 mg/dL — ABNORMAL HIGH (ref 0.50–1.10)
GFR calc Af Amer: 5 mL/min — ABNORMAL LOW (ref >90)
GFR, EST NON AFRICAN AMERICAN: 4 mL/min — AB (ref >90)
GLUCOSE: 161 mg/dL — AB (ref 70–99)
POTASSIUM: 4.6 meq/L (ref 3.7–5.3)
SODIUM: 137 meq/L (ref 137–147)

## 2013-02-17 LAB — TROPONIN I

## 2013-02-17 LAB — CBC
HCT: 31.2 % — ABNORMAL LOW (ref 36.0–46.0)
HEMOGLOBIN: 10.2 g/dL — AB (ref 12.0–15.0)
MCH: 26 pg (ref 26.0–34.0)
MCHC: 32.7 g/dL (ref 30.0–36.0)
MCV: 79.4 fL (ref 78.0–100.0)
Platelets: 131 10*3/uL — ABNORMAL LOW (ref 150–400)
RBC: 3.93 MIL/uL (ref 3.87–5.11)
RDW: 16.6 % — ABNORMAL HIGH (ref 11.5–15.5)
WBC: 3.2 10*3/uL — AB (ref 4.0–10.5)

## 2013-02-17 LAB — INFLUENZA PANEL BY PCR (TYPE A & B)
H1N1 flu by pcr: NOT DETECTED
Influenza A By PCR: NEGATIVE
Influenza B By PCR: NEGATIVE

## 2013-02-17 MED ORDER — DARBEPOETIN ALFA-POLYSORBATE 60 MCG/0.3ML IJ SOLN
INTRAMUSCULAR | Status: AC
  Administered 2013-02-17: 60 ug via INTRAVENOUS
  Filled 2013-02-17: qty 0.3

## 2013-02-17 MED ORDER — HYDRALAZINE HCL 20 MG/ML IJ SOLN: 10.0000 mg | INTRAMUSCULAR | Status: DC | PRN

## 2013-02-17 MED ORDER — HYDRALAZINE HCL 50 MG PO TABS
75.0000 mg | ORAL_TABLET | Freq: Three times a day (TID) | ORAL | Status: DC
  Administered 2013-02-17 (×3): 75 mg via ORAL
  Filled 2013-02-17 (×6): qty 1

## 2013-02-17 MED ORDER — DOXERCALCIFEROL 4 MCG/2ML IV SOLN
6.0000 ug | INTRAVENOUS | Status: DC
  Administered 2013-02-17 – 2013-02-21 (×3): 6 ug via INTRAVENOUS
  Filled 2013-02-17 (×2): qty 4

## 2013-02-17 MED ORDER — RENA-VITE PO TABS
1.0000 | ORAL_TABLET | Freq: Every day | ORAL | Status: DC
  Administered 2013-02-17 – 2013-02-20 (×4): 1 via ORAL
  Filled 2013-02-17 (×5): qty 1

## 2013-02-17 MED ORDER — IPRATROPIUM-ALBUTEROL 0.5-2.5 (3) MG/3ML IN SOLN
3.0000 mL | Freq: Three times a day (TID) | RESPIRATORY_TRACT | Status: DC
  Administered 2013-02-17 – 2013-02-21 (×12): 3 mL via RESPIRATORY_TRACT
  Filled 2013-02-17 (×11): qty 3

## 2013-02-17 MED ORDER — DIPHENHYDRAMINE HCL 25 MG PO CAPS
ORAL_CAPSULE | ORAL | Status: AC
  Administered 2013-02-17: 75 mg via ORAL
  Filled 2013-02-17: qty 3

## 2013-02-17 MED ORDER — ALBUTEROL SULFATE (2.5 MG/3ML) 0.083% IN NEBU: 2.5000 mg | INHALATION_SOLUTION | Freq: Three times a day (TID) | RESPIRATORY_TRACT | Status: DC

## 2013-02-17 MED ORDER — VANCOMYCIN HCL 500 MG IV SOLR
500.0000 mg | Freq: Once | INTRAVENOUS | Status: AC
  Administered 2013-02-17: 500 mg via INTRAVENOUS
  Filled 2013-02-17: qty 500

## 2013-02-17 MED ORDER — SEVELAMER CARBONATE 800 MG PO TABS
3200.0000 mg | ORAL_TABLET | Freq: Three times a day (TID) | ORAL | Status: DC
Start: 2013-02-17 — End: 2013-02-21
  Administered 2013-02-18 – 2013-02-21 (×8): 3200 mg via ORAL
  Filled 2013-02-17 (×14): qty 4

## 2013-02-17 MED ORDER — DARBEPOETIN ALFA-POLYSORBATE 60 MCG/0.3ML IJ SOLN
60.0000 ug | INTRAMUSCULAR | Status: DC
  Administered 2013-02-17: 60 ug via INTRAVENOUS

## 2013-02-17 MED ORDER — NA FERRIC GLUC CPLX IN SUCROSE 12.5 MG/ML IV SOLN
125.0000 mg | INTRAVENOUS | Status: DC
  Administered 2013-02-17 – 2013-02-21 (×3): 125 mg via INTRAVENOUS
  Filled 2013-02-17 (×6): qty 10

## 2013-02-17 MED ORDER — PREDNISONE 10 MG PO TABS
10.0000 mg | ORAL_TABLET | Freq: Every day | ORAL | Status: DC
  Administered 2013-02-17 – 2013-02-21 (×5): 10 mg via ORAL
  Filled 2013-02-17 (×6): qty 1

## 2013-02-17 MED ORDER — PREDNISOLONE 5 MG PO TABS
10.0000 mg | ORAL_TABLET | Freq: Every day | ORAL | Status: DC
  Filled 2013-02-17 (×2): qty 2

## 2013-02-17 MED ORDER — DIPHENHYDRAMINE HCL 25 MG PO CAPS
75.0000 mg | ORAL_CAPSULE | Freq: Once | ORAL | Status: AC
Start: 2013-02-17 — End: 2013-02-17
  Administered 2013-02-17: 75 mg via ORAL

## 2013-02-17 MED ORDER — DOXERCALCIFEROL 4 MCG/2ML IV SOLN
INTRAVENOUS | Status: AC
  Administered 2013-02-17: 6 ug via INTRAVENOUS
  Filled 2013-02-17: qty 4

## 2013-02-17 MED ORDER — VANCOMYCIN HCL IN DEXTROSE 750-5 MG/150ML-% IV SOLN
750.0000 mg | INTRAVENOUS | Status: DC
  Administered 2013-02-17: 750 mg via INTRAVENOUS
  Filled 2013-02-17 (×3): qty 150

## 2013-02-17 MED ORDER — DEXTROSE 5 % IV SOLN
1.0000 g | INTRAVENOUS | Status: DC
  Administered 2013-02-17: 1 g via INTRAVENOUS
  Filled 2013-02-17 (×2): qty 1

## 2013-02-17 MED ORDER — NEPRO/CARBSTEADY PO LIQD
237.0000 mL | Freq: Two times a day (BID) | ORAL | Status: DC
  Administered 2013-02-18 – 2013-02-21 (×6): 237 mL via ORAL
  Filled 2013-02-17 (×10): qty 237

## 2013-02-17 NOTE — Progress Notes (Signed)
ANTIBIOTIC CONSULT NOTE - INITIAL  Pharmacy Consult for Vancomycin Indication: pneumonia  Allergies  Allergen Reactions  . Heparin Other (See Comments)    MDs told her not to take after reaction in ICU  . Iohexol Swelling and Other (See Comments)    1970s; passed out and had facial/tongue swelling.  Requires 13-hour prep with prednisone and benadryl  . Phenytoin Other (See Comments)    Had reaction while in ICU; doesn't know.  MDs told her not to take ever again.  . Wellbutrin [Bupropion] Other (See Comments)    seizures  . Ampicillin Hives and Rash  . Meperidine Hcl Swelling and Rash    Makes tongue swell  . Morphine Rash  . Penicillins Rash  . Valproic Acid And Related Other (See Comments)    Confusion   . Ace Inhibitors Rash  . Pentazocine Lactate Other (See Comments)    Patient does not remember reaction to this med ([place]).     Patient Measurements: Height: 5\' 3"  (160 cm) Weight: 147 lb 14.5 oz (67.09 kg) IBW/kg (Calculated) : 52.4 Adjusted Body Weight: n/a  Vital Signs: Temp: 98.3 F (36.8 C) (02/16 0600) Temp src: Oral (02/16 0600) BP: 163/77 mmHg (02/16 0600) Pulse Rate: 77 (02/16 0600) Intake/Output from previous day:   Intake/Output from this shift: Total I/O In: 360 [P.O.:360] Out: -   Labs:  Recent Labs  02/16/13 1945 02/17/13 0506  WBC 5.1 3.2*  HGB 10.1* 10.2*  PLT 188 131*  CREATININE 8.30* 9.09*   Estimated Creatinine Clearance: 5.5 ml/min (by C-G formula based on Cr of 9.09). No results found for this basename: VANCOTROUGH, VANCOPEAK, VANCORANDOM, GENTTROUGH, GENTPEAK, GENTRANDOM, TOBRATROUGH, TOBRAPEAK, TOBRARND, AMIKACINPEAK, AMIKACINTROU, AMIKACIN,  in the last 72 hours   Microbiology: No results found for this or any previous visit (from the past 720 hour(s)).  Medical History: Past Medical History  Diagnosis Date  . CAD (coronary artery disease)     stent to RCA  . CVA (cerebral infarction) 2003    no apparent residual  .  Hypothyroidism   . PVD (peripheral vascular disease)   . Hyperlipidemia   . Positive PPD     completed rifampin  . Diastolic congestive heart failure   . Hypertension   . COPD (chronic obstructive pulmonary disease) t  . Cancer     clear cell cancer, kidney  . Complication of anesthesia 12/2010    pt is very confused, with AMS with anesthesia  . CVA 07/27/2008    CVA affected cognition and memory per family, no focal deficits.    . ESRD 07/27/2008    ESRD due to HTN and NSAID's, started hemodialysis in 2005 in [place], [place]. Went to [facility] from 2010 to 2012 and since 2012 has been getting dialysis at [facility] on [address] in [place] on a MWF schedule. First access with RUA AVG placed in [place]. Next and current access was LUA AVG placed by Dr. Lucky Cowboy in [place] in or around 2012. Has had 2 or 3 procedures on that graft since placed per family. She gets her access work done here in [place] now. She is allergic to heparin and does not get any heparin at dialysis; she had an allergic reaction apparently when in ICU in the past.      . Encephalopathy   . Seizures   . Dyslipidemia   . Chronic renal insufficiency     On hemodialysis  . Arthritis of shoulder     Bilateral  . Depression  Medications:  Scheduled:  . amLODipine  10 mg Oral QHS  . aspirin EC  81 mg Oral Daily  . budesonide (PULMICORT) nebulizer solution  0.25 mg Nebulization BID  . carvedilol  25 mg Oral BID WC  . cinacalcet  60 mg Oral QHS  . darbepoetin  60 mcg Intravenous Q7 days  . doxercalciferol  6 mcg Intravenous Q M,W,F-HD  . ferric gluconate (FERRLECIT/NULECIT) IV  125 mg Intravenous Q M,W,F-HD  . hydrALAZINE  75 mg Oral TID  . ipratropium-albuterol  3 mL Nebulization TID  . levETIRAcetam  500 mg Oral Q2000  . levothyroxine  88 mcg Oral QAC breakfast  . multivitamin  1 tablet Oral QHS  . oseltamivir  30 mg Oral Q M,W,F-2000  . pantoprazole  40 mg Oral Daily  . predniSONE  10 mg Oral  Q breakfast  . sevelamer carbonate  800 mg Oral TID WC  . simvastatin  10 mg Oral q1800  . sodium chloride  3 mL Intravenous Q12H  . sodium chloride  3 mL Intravenous Q12H  . vancomycin  500 mg Intravenous Once   Assessment: 68 yo female with ESRD admitted with HCAP.  Given cefepime and vancomycin in ED.  Pharmacy asked to continue these antibiotics.  Was given 2g of cefepime and 1g of vancomycin in the ED last night.  No HD since then, will likely go to HD today.  No culture results.  Afebrile, and WBC low.  Goal of Therapy:  Pre-HD vancomycin level 15-25  Plan:  1. Vancomycin 500 mg IV x 1 now to complete loading dose. 2. Then will begin vancomycin 750 mg IV after each HD session. 3. Will give cefepime 2 g IV after each session as well. 4. Will follow HD schedule and clinical progression.  Uvaldo Rising, BCPS  Clinical Pharmacist Pager [phone]  02/17/2013 9:54 AM

## 2013-02-17 NOTE — Progress Notes (Signed)
UR completed Mayre Bury K. Galileo Colello, RN, BSN, MSHL, CCM  02/17/2013 8:31 AM

## 2013-02-17 NOTE — Progress Notes (Signed)
INITIAL NUTRITION ASSESSMENT  DOCUMENTATION CODES Per approved criteria  -Not Applicable   INTERVENTION: Provide Nepro Shakes BID Encourage PO intake  NUTRITION DIAGNOSIS: Inadequate oral intake related to poor appetite as evidenced by pt's report of only eating a few bites of food per meal.   Goal: Pt to meet >/= 90% of their estimated nutrition needs   Monitor:  PO intake, weight trends, labs  Reason for Assessment: Malnutrition Screening Tool, score of 5  68 y.o. female  Admitting Dx: HAP (hospital-acquired pneumonia)  ASSESSMENT: 68 y.o. female history of COPD, CAD, HTN, and ESRD on hemodialysis on Monday Wednesday and Friday presents to the ER with complaints of increasing shortness of breath and wheezing with productive cough. Patient has been having shortness of breath the last one week.  Pt states that she has had a poor appetite for a long time now. She states that PTA she was only eating a few bites of food at each meal. Pt reports 20 lbs weight loss but, states her usual dry weight is 67 kg (147 lbs) which is what she currently weighs. Question accuracy of pt's report. Per nursing notes pt ate 100% of breakfast this morning.   Nutrition Focused Physical Exam:  Subcutaneous Fat:  Orbital Region: wnl Upper Arm Region: moderate wasting Thoracic and Lumbar Region: NA  Muscle:  Temple Region: moderate wasting Clavicle Bone Region: moderate wasting Clavicle and Acromion Bone Region: mild wasting Scapular Bone Region: NA Dorsal Hand: wnl Patellar Region: mild wasting Anterior Thigh Region: wnl Posterior Calf Region: wnl  Edema: non-pitting RLE and LLE edema  Height: Ht Readings from Last 1 Encounters:  02/16/13 5\' 3"  (1.6 m)    Weight: Wt Readings from Last 1 Encounters:  02/17/13 147 lb 14.5 oz (67.09 kg)    Ideal Body Weight: 115 lbs  % Ideal Body Weight: 128%  Wt Readings from Last 10 Encounters:  02/17/13 147 lb 14.5 oz (67.09 kg)  09/26/12 156  lb 15.5 oz (71.2 kg)  09/16/12 145 lb (65.772 kg)  08/08/12 148 lb (67.132 kg)  09/14/11 148 lb (67.132 kg)  07/03/12 154 lb (69.854 kg)  07/01/12 148 lb 13 oz (67.5 kg)  02/29/12 138 lb (62.596 kg)  01/22/12 130 lb 11.7 oz (59.3 kg)  09/19/11 147 lb 12.8 oz (67.042 kg)    Usual Body Weight: 147 lbs  % Usual Body Weight: 100%  BMI:  Body mass index is 26.21 kg/(m^2).  Estimated Nutritional Needs: Kcal: 1900-2100 Protein: 80-90 grams Fluid: 1200 ml fluid restriction per MD  Skin: intact; non-pitting RLE and LLE edema  Diet Order: Renal  EDUCATION NEEDS: -No education needs identified at this time   Intake/Output Summary (Last 24 hours) at 02/17/13 1057 Last data filed at 02/17/13 0833  Gross per 24 hour  Intake    360 ml  Output      0 ml  Net    360 ml    Last BM: 2/14  Labs:   Recent Labs Lab 02/16/13 1945 02/17/13 0506  NA 137 137  K 4.2 4.6  CL 92* 93*  CO2 27 25  BUN 35* 41*  CREATININE 8.30* 9.09*  CALCIUM 10.7* 10.3  GLUCOSE 97 161*    CBG (last 3)  No results found for this basename: GLUCAP,  in the last 72 hours  Scheduled Meds: . amLODipine  10 mg Oral QHS  . aspirin EC  81 mg Oral Daily  . budesonide (PULMICORT) nebulizer solution  0.25 mg Nebulization BID  .  carvedilol  25 mg Oral BID WC  . ceFEPime (MAXIPIME) IV  1 g Intravenous Q M,W,F-HD  . cinacalcet  60 mg Oral QHS  . darbepoetin  60 mcg Intravenous Q Mon-HD  . doxercalciferol  6 mcg Intravenous Q M,W,F-HD  . ferric gluconate (FERRLECIT/NULECIT) IV  125 mg Intravenous Q M,W,F-HD  . hydrALAZINE  75 mg Oral TID  . ipratropium-albuterol  3 mL Nebulization TID  . levETIRAcetam  500 mg Oral Q2000  . levothyroxine  88 mcg Oral QAC breakfast  . multivitamin  1 tablet Oral QHS  . oseltamivir  30 mg Oral Q M,W,F-2000  . pantoprazole  40 mg Oral Daily  . predniSONE  10 mg Oral Q breakfast  . sevelamer carbonate  800 mg Oral TID WC  . simvastatin  10 mg Oral q1800  . sodium chloride   3 mL Intravenous Q12H  . sodium chloride  3 mL Intravenous Q12H  . vancomycin  500 mg Intravenous Once  . vancomycin  750 mg Intravenous Q M,W,F-HD    Continuous Infusions:   Past Medical History  Diagnosis Date  . CAD (coronary artery disease)     stent to RCA  . CVA (cerebral infarction) 2003    no apparent residual  . Hypothyroidism   . PVD (peripheral vascular disease)   . Hyperlipidemia   . Positive PPD     completed rifampin  . Diastolic congestive heart failure   . Hypertension   . COPD (chronic obstructive pulmonary disease) t  . Cancer     clear cell cancer, kidney  . Complication of anesthesia 12/2010    pt is very confused, with AMS with anesthesia  . CVA 07/27/2008    CVA affected cognition and memory per family, no focal deficits.    . ESRD 07/27/2008    ESRD due to HTN and NSAID's, started hemodialysis in 2005 in [place], [place]. Went to [facility] from 2010 to 2012 and since 2012 has been getting dialysis at [facility] on [address] in [place] on a MWF schedule. First access with RUA AVG placed in [place]. Next and current access was LUA AVG placed by Dr. Lucky Cowboy in [place] in or around 2012. Has had 2 or 3 procedures on that graft since placed per family. She gets her access work done here in [place] now. She is allergic to heparin and does not get any heparin at dialysis; she had an allergic reaction apparently when in ICU in the past.      . Encephalopathy   . Seizures   . Dyslipidemia   . Chronic renal insufficiency     On hemodialysis  . Arthritis of shoulder     Bilateral  . Depression     Past Surgical History  Procedure Laterality Date  . Abdominal hysterectomy    . Cholecystectomy    . D&cs    . Right ankle repair    . Left nephrectomy    . Vascular surgery  11/2010    graft inserted to left arm  . Nephrectomy Left     Malignant tumor  . Cataract extraction Bilateral   . Tonsillectomy    . Cardiac catheterization      Pryor Ochoa RD, LDN Inpatient Clinical Dietitian Pager: [phone] After Hours Pager: [phone]

## 2013-02-17 NOTE — Consult Note (Signed)
Indication for Consultation:  Management of ESRD/hemodialysis; anemia, hypertension/volume and secondary hyperparathyroidism  HPI: [name] is a 68 y.o. female admitted Sunday for sob and wheezing. She receives HD MWF @ [place], history of COPD, HTN, CHF, hypothyroid and seizures. She had been feeling well until Friday night when she started to experience sob and wheezing, she also had a nonproductive cough. It got progressively worse so she came to the ED sunday. She last had HD Friday without any problems, however it appears she has not been running her full tx.  Chest xray showed RLL infiltrate and she was started on antibiotics and given IV steriods and neb treatments. Troponin was negative. She states she is feeling much better now, she slept well, breathing is better and is not feeling as weak.    Past Medical History  Diagnosis Date  . CAD (coronary artery disease)     stent to RCA  . CVA (cerebral infarction) 2003    no apparent residual  . Hypothyroidism   . PVD (peripheral vascular disease)   . Hyperlipidemia   . Positive PPD     completed rifampin  . Diastolic congestive heart failure   . Hypertension   . COPD (chronic obstructive pulmonary disease) t  . Cancer     clear cell cancer, kidney  . Complication of anesthesia 12/2010    pt is very confused, with AMS with anesthesia  . CVA 07/27/2008    CVA affected cognition and memory per family, no focal deficits.    . ESRD 07/27/2008    ESRD due to HTN and NSAID's, started hemodialysis in 2005 in [place], [place]. Went to [facility] from 2010 to 2012 and since 2012 has been getting dialysis at [facility] on [address] in [place] on a MWF schedule. First access with RUA AVG placed in [place]. Next and current access was LUA AVG placed by Dr. Lucky Cowboy in [place] in or around 2012. Has had 2 or 3 procedures on that graft since placed per family. She gets her access work done here in [place] now. She is allergic to heparin  and does not get any heparin at dialysis; she had an allergic reaction apparently when in ICU in the past.      . Encephalopathy   . Seizures   . Dyslipidemia   . Chronic renal insufficiency     On hemodialysis  . Arthritis of shoulder     Bilateral  . Depression    Past Surgical History  Procedure Laterality Date  . Abdominal hysterectomy    . Cholecystectomy    . D&cs    . Right ankle repair    . Left nephrectomy    . Vascular surgery  11/2010    graft inserted to left arm  . Nephrectomy Left     Malignant tumor  . Cataract extraction Bilateral   . Tonsillectomy    . Cardiac catheterization     Family History  Problem Relation Age of Onset  . Heart disease Father   . Hypertension Mother   . Dementia Mother   . Coronary artery disease Sister   . Heart attack Sister   . Hypertension Brother    Social History: She reports her last cigarette was last monday Allergies  Allergen Reactions  . Heparin Other (See Comments)    MDs told her not to take after reaction in ICU  . Iohexol Swelling and Other (See Comments)    1970s; passed out and had facial/tongue swelling.  Requires  13-hour prep with prednisone and benadryl  . Phenytoin Other (See Comments)    Had reaction while in ICU; doesn't know.  MDs told her not to take ever again.  . Wellbutrin [Bupropion] Other (See Comments)    seizures  . Ampicillin Hives and Rash  . Meperidine Hcl Swelling and Rash    Makes tongue swell  . Morphine Rash  . Penicillins Rash  . Valproic Acid And Related Other (See Comments)    Confusion   . Ace Inhibitors Rash  . Pentazocine Lactate Other (See Comments)    Patient does not remember reaction to this med ([place]win).    Prior to Admission medications   Medication Sig Start Date End Date Taking? Authorizing Provider  acetaminophen (TYLENOL) 500 MG tablet Take 500 mg by mouth every 6 (six) hours as needed for pain.   Yes Historical Provider, MD  albuterol-ipratropium (COMBIVENT)  18-103 MCG/ACT inhaler Inhale 2 puffs into the lungs every 6 (six) hours as needed. For shortness of breath   Yes Historical Provider, MD  amLODipine (NORVASC) 10 MG tablet Take 10 mg by mouth at bedtime.    Yes Historical Provider, MD  aspirin EC 81 MG tablet Take 81 mg by mouth daily.   Yes Historical Provider, MD  carvedilol (COREG) 25 MG tablet Take 25 mg by mouth 2 (two) times daily with a meal.   Yes Historical Provider, MD  cinacalcet (SENSIPAR) 30 MG tablet Take 60 mg by mouth at bedtime.    Yes Historical Provider, MD  diphenhydrAMINE (BENADRYL) 25 MG tablet Take 75 mg by mouth daily as needed for itching (Takes prior to dialysis).   Yes Historical Provider, MD  Fluticasone-Salmeterol (ADVAIR DISKUS) 250-50 MCG/DOSE AEPB Inhale 1 puff into the lungs 2 (two) times daily. 09/26/12  Yes Kelvin Cellar, MD  hydrALAZINE (APRESOLINE) 50 MG tablet Take 50 mg by mouth 3 (three) times daily.   Yes Historical Provider, MD  hydrocortisone cream 1 % Apply 1 application topically daily as needed for itching.   Yes Historical Provider, MD  levETIRAcetam (KEPPRA) 500 MG tablet Take 500 mg by mouth daily at 8 pm.   Yes Historical Provider, MD  levothyroxine (SYNTHROID, LEVOTHROID) 88 MCG tablet Take 88 mcg by mouth daily before breakfast.   Yes Historical Provider, MD  Phenylephrine-APAP-Guaifenesin (MUCINEX FAST-MAX COLD & SINUS) 10-650-400 MG/20ML LIQD Take 30 mLs by mouth daily as needed (for cold).   Yes Historical Provider, MD  pravastatin (PRAVACHOL) 20 MG tablet Take 20 mg by mouth daily.   Yes Historical Provider, MD  sevelamer (RENAGEL) 800 MG tablet Take 1,600-3,200 mg by mouth See admin instructions. Take 4 tablets in the morning, at lunch, and 4 tablets dinner. Takes 1,600 mg with snacks twice a day.   Yes Historical Provider, MD  traZODone (DESYREL) 50 MG tablet Take 50 mg by mouth at bedtime as needed for sleep.    Yes Historical Provider, MD  NITROSTAT 0.4 MG SL tablet Place 0.4 mg under the  tongue every 5 (five) minutes as needed. For chest pain. 09/16/10   Historical Provider, MD   Current Facility-Administered Medications  Medication Dose Route Frequency Provider Last Rate Last Dose  . acetaminophen (TYLENOL) tablet 650 mg  650 mg Oral Q6H PRN Rise Patience, MD       Or  . acetaminophen (TYLENOL) suppository 650 mg  650 mg Rectal Q6H PRN Rise Patience, MD      . albuterol (PROVENTIL) (2.5 MG/3ML) 0.083% nebulizer solution 2.5 mg  2.5 mg Nebulization Q2H PRN Rise Patience, MD      . amLODipine (NORVASC) tablet 10 mg  10 mg Oral QHS Rise Patience, MD   10 mg at 02/16/13 2354  . aspirin EC tablet 81 mg  81 mg Oral Daily Rise Patience, MD      . budesonide (PULMICORT) nebulizer solution 0.25 mg  0.25 mg Nebulization BID Rise Patience, MD   0.25 mg at 02/17/13 0815  . carvedilol (COREG) tablet 25 mg  25 mg Oral BID WC Rise Patience, MD   25 mg at 02/17/13 [id number]  . cinacalcet (SENSIPAR) tablet 60 mg  60 mg Oral QHS Rise Patience, MD   60 mg at 02/16/13 2354  . diphenhydrAMINE (BENADRYL) capsule 75 mg  75 mg Oral Daily PRN Rise Patience, MD      . hydrALAZINE (APRESOLINE) injection 10 mg  10 mg Intravenous Q4H PRN Rise Patience, MD      . hydrALAZINE (APRESOLINE) tablet 75 mg  75 mg Oral TID [name], MD      . hydrocortisone cream 1 % 1 application  1 application Topical Daily PRN Rise Patience, MD      . ipratropium-albuterol (DUONEB) 0.5-2.5 (3) MG/3ML nebulizer solution 3 mL  3 mL Nebulization TID Rise Patience, MD   3 mL at 02/17/13 0815  . levETIRAcetam (KEPPRA) tablet 500 mg  500 mg Oral Q2000 Rise Patience, MD   500 mg at 02/16/13 2353  . levothyroxine (SYNTHROID, LEVOTHROID) tablet 88 mcg  88 mcg Oral QAC breakfast Rise Patience, MD   88 mcg at 02/17/13 0630  . nitroGLYCERIN (NITROSTAT) SL tablet 0.4 mg  0.4 mg Sublingual Q5 min PRN Rise Patience, MD      . ondansetron [facility])  tablet 4 mg  4 mg Oral Q6H PRN Rise Patience, MD       Or  . ondansetron [facility]) injection 4 mg  4 mg Intravenous Q6H PRN Rise Patience, MD      . oseltamivir (TAMIFLU) capsule 30 mg  30 mg Oral Q M,W,F-2000 Rise Patience, MD   30 mg at 02/16/13 2353  . pantoprazole (PROTONIX) EC tablet 40 mg  40 mg Oral Daily Rise Patience, MD      . predniSONE (DELTASONE) tablet 10 mg  10 mg Oral Q breakfast [name], MD      . sevelamer carbonate (RENVELA) tablet 800 mg  800 mg Oral TID WC Rise Patience, MD   800 mg at 02/17/13 0630  . simvastatin (ZOCOR) tablet 10 mg  10 mg Oral q1800 Rise Patience, MD      . sodium chloride 0.9 % injection 3 mL  3 mL Intravenous Q12H Rise Patience, MD      . sodium chloride 0.9 % injection 3 mL  3 mL Intravenous Q12H Rise Patience, MD   3 mL at 02/17/13 0001  . traZODone (DESYREL) tablet 50 mg  50 mg Oral QHS PRN Rise Patience, MD   50 mg at 02/17/13 0115   Labs: Basic Metabolic Panel:  Recent Labs Lab 02/16/13 1945 02/17/13 0506  NA 137 137  K 4.2 4.6  CL 92* 93*  CO2 27 25  GLUCOSE 97 161*  BUN 35* 41*  CREATININE 8.30* 9.09*  CALCIUM 10.7* 10.3   Liver Function Tests: No results found for this basename: AST, ALT, ALKPHOS, BILITOT, PROT, ALBUMIN,  in the last 168 hours No results found for this basename: LIPASE, AMYLASE,  in the last 168 hours No results found for this basename: AMMONIA,  in the last 168 hours CBC:  Recent Labs Lab 02/16/13 1945 02/17/13 0506  WBC 5.1 3.2*  HGB 10.1* 10.2*  HCT 30.7* 31.2*  MCV 80.2 79.4  PLT 188 131*   Cardiac Enzymes:  Recent Labs Lab 02/16/13 0002  TROPONINI <0.30   CBG: No results found for this basename: GLUCAP,  in the last 168 hours Iron Studies: No results found for this basename: IRON, TIBC, TRANSFERRIN, FERRITIN,  in the last 72 hours Studies/Results: Dg Chest Port 1 View  02/16/2013   CLINICAL DATA:  Shortness of breath  EXAM:  PORTABLE CHEST - 1 VIEW  COMPARISON:  DG CHEST 2 VIEW dated 09/23/2012  FINDINGS: Low lung volumes. Increased density projects within the right lung base. The cardiac silhouette is enlarged. Atherosclerotic calcifications appreciated within the aorta. The osseous structures unremarkable.  IMPRESSION: Right lower lobe infiltrate. Stable cardiomegaly with technique taken into consideration.   Electronically Signed   By: Margaree Mackintosh M.D.   On: 02/16/2013 19:56    ROS:  Review of Systems: Gen: Denies any fever, chills, sweats, anorexia,weight loss, and sleep disorder. Feeling stonger HEENT: No visual complaints,  Normal external appearance No Epistaxis or Sore throat. No sinusitis.   CV: Denies chest pain, angina, palpitations, syncope, orthopnea, PND, peripheral edema, and claudication. Resp: Reports SOB and wheezing- though much improved now, productive cough with small amt clear sputum. Denies coughing up blood, and pleurisy. GI: Denies vomiting blood, Denies dysphagia or odynophagia. GU : Denies urinary burning, blood in urine, urinary frequency, urinary hesitancy, nocturnal urination, and urinary incontinence.  No renal calculi. MS: Denies joint pain, limitation of movement, and swelling, stiffness, low back pain, extremity pain. Denies muscle weakness, cramps, atrophy.  No use of non steroidal antiinflammatory drugs. Derm: Denies rash,dry skin, hives, moles, warts, or unhealing ulcers.  Reports itching- has appt pending with derm in march. Psych: Denies depression, anxiety, memory loss, suicidal ideation, hallucinations, paranoia, and confusion. Heme: Denies bruising, bleeding, and enlarged lymph nodes. Neuro: No headache.  No diplopia. No dysarthria.  No dysphasia.  No history of CVA.  No paresthesias.   Endocrine No DM.  No Adrenal disease.  Physical Exam: Filed Vitals:   02/17/13 0019 02/17/13 0100 02/17/13 0600 02/17/13 0815  BP: 190/90 150/70 163/77   Pulse: 91 84 77   Temp:   98.3  F (36.8 C)   TempSrc:   Oral   Resp:   21   Height:      Weight:   67.09 kg (147 lb 14.5 oz)   SpO2:   96% 97%     General: Well developed, well nourished, in no acute distress. Sitting on side of bed Head: Normocephalic, atraumatic, sclera non-icteric, mucus membranes are moist Neck: Supple. JVD not elevated. Lungs: Decreased in lower lobes, few rales in RLL Breathing is unlabored. Heart: RRR with S1 S2. No murmurs, rubs, or gallops appreciated. Abdomen: Soft, non-tender, non-distended with normoactive bowel sounds. No rebound/guarding. No obvious abdominal masses. M-S:  Strength and tone appear normal for age. Lower extremities: Trace LE edema. without  ischemic changes, no open wounds  Neuro: Alert and oriented X 3. Moves all extremities spontaneously. Psych:  Responds to questions appropriately with a normal affect. Dialysis Access: L Hero +bruit/thrill  Dialysis Orders: MWF at [facility]. 66.5  2K/2C  4hrs  NO Heparin. L AV  Hero 350/1.5    Hectorol 62mcg IV/HD Epogen 11000   Units IV/HD  Venofer  100mg  qHd x10 (stop 2/20)   Assessment/Plan: 1.  Pneumonia- chest xray- RLL infiltrate. Started cefepime, vanc and tamiflu (pending influenza PCR)- per primary. Droplet precaution.  2. COPD-Neb treatments. steriod taper. 3.  ESRD -  MWF @ [place], HD today. K+ 4.6. No heparin.  4.  Hypertension/volume  - BP elevated 163/77, amlodipine, coreg, hydralazine. No significant volume overload 5.  Anemia  - hgb 10.2. Continue ESA. Tsat 13 1/28 Venofer 100mg  qHD x10- cont, stops 2/20 6.  Metabolic bone disease -  renvela with meals sensipar. hectorol- labs 1/28 phos 4.6, PRH 162 7.  Nutrition - renal diet, multivitamin. Albumin 4.0 (1/28) 8. Seizure disorder- Keppra, per primary  Shelle Iron, NP [facility] [phone] 02/17/2013, 9:19 AM   Renal Attending: Presented with SOB improved with med tx. Reports got off HD early Friday. CXR RLL infilrate, cardiomegaly and LLL region  obscured.  Exam 1+ RLE edema. For HD today and can reassess component of volume. ACPowell, MD

## 2013-02-17 NOTE — Progress Notes (Signed)
DR Florene Glen paged for dialysis orders. Pt states she runs on M-W-F schedule.

## 2013-02-17 NOTE — Progress Notes (Addendum)
[facility] PROGRESS NOTE  [name] LDJ:[id number] DOB: Oct 22, 1945 DOA: 02/16/2013 PCP: [name], MD  Assessment/Plan: 68 y.o. female history of HTN, COPD, smoker, ESRD on hemodialysis on Monday Wednesday and Friday presents to the ER with complaints of increasing shortness of breath and wheezing with productive cough. Chest x-ray showing possibility of pneumonia and patient has been started antibiotics and admitted for further management.   Principal Problem:  HAP (hospital-acquired pneumonia)  Active Problems:  CAD  Chronic diastolic heart failure  ESRD  COPD exacerbation  Pneumonia    1. HCAP; QZE:SPQZR lower lobe infiltrate -cont cefepime, vanc; added Tamiflu pend influenza PCR  2. COPD, no wheezing on exam; taper steroids, cont nebulizers. Stop smoking  3. HTN uncontrolled -resume home meds; increased hydralazine to 575 TID< cont BB, amlodipine; start HD, p[rn hydralazine  4. CAD status post stenting - denies any chest pain. Cont home regimen  5. ESRD on hemodialysis on Monday Wednesday and Friday - patient does not look to be in obvious fluid overload. I have left a message for dialysis at [phone]. 6. H/o diastolic CHF last EF measured in 2013 was 50-55% with 2-D echo showing severe pulmonary hypertension; clinically euvolemic; cont HD for volume control;  7. history of seizures - continue present medications  Code Status: full Family Communication: d/w patient, called updated [name] Daughter [phone] (indicate person spoken with, relationship, and if by phone, the number) Disposition Plan: home when improved, 24-48 hours    Consultants:  Nephrology   Procedures:  Pend HD  Antibiotics:  Cefepime 2/15<<<<<  vanc 2/15<<<<   (indicate start date, and stop date if known)  HPI/Subjective: alert  Objective: Filed Vitals:   02/17/13 0600  BP: 163/77  Pulse: 77  Temp: 98.3 F (36.8 C)  Resp: 21   No intake or output data in the  24 hours ending 02/17/13 0812 Filed Weights   02/16/13 2300 02/17/13 0600  Weight: 67.903 kg (149 lb 11.2 oz) 67.09 kg (147 lb 14.5 oz)    Exam:   General:  alert  Cardiovascular: s1,s2 rrr  Respiratory: few rales inL lung   Abdomen: soft, nt, nd   Musculoskeletal: no edema   Data Reviewed: Basic Metabolic Panel:  Recent Labs Lab 02/16/13 1945 02/17/13 0506  NA 137 137  K 4.2 4.6  CL 92* 93*  CO2 27 25  GLUCOSE 97 161*  BUN 35* 41*  CREATININE 8.30* 9.09*  CALCIUM 10.7* 10.3   Liver Function Tests: No results found for this basename: AST, ALT, ALKPHOS, BILITOT, PROT, ALBUMIN,  in the last 168 hours No results found for this basename: LIPASE, AMYLASE,  in the last 168 hours No results found for this basename: AMMONIA,  in the last 168 hours CBC:  Recent Labs Lab 02/16/13 1945 02/17/13 0506  WBC 5.1 3.2*  HGB 10.1* 10.2*  HCT 30.7* 31.2*  MCV 80.2 79.4  PLT 188 131*   Cardiac Enzymes:  Recent Labs Lab 02/16/13 0002  TROPONINI <0.30   BNP (last 3 results)  Recent Labs  09/23/12 0450 02/16/13 1945  PROBNP 10221.0* 31758.0*   CBG: No results found for this basename: GLUCAP,  in the last 168 hours  No results found for this or any previous visit (from the past 240 hour(s)).   Studies: Dg Chest Port 1 View  02/16/2013   CLINICAL DATA:  Shortness of breath  EXAM: PORTABLE CHEST - 1 VIEW  COMPARISON:  DG CHEST 2 VIEW dated 09/23/2012  FINDINGS: Low lung volumes.  Increased density projects within the right lung base. The cardiac silhouette is enlarged. Atherosclerotic calcifications appreciated within the aorta. The osseous structures unremarkable.  IMPRESSION: Right lower lobe infiltrate. Stable cardiomegaly with technique taken into consideration.   Electronically Signed   By: Margaree Mackintosh M.D.   On: 02/16/2013 19:56    Scheduled Meds: . amLODipine  10 mg Oral QHS  . aspirin EC  81 mg Oral Daily  . budesonide (PULMICORT) nebulizer solution  0.25  mg Nebulization BID  . carvedilol  25 mg Oral BID WC  . cinacalcet  60 mg Oral QHS  . hydrALAZINE  50 mg Oral TID  . ipratropium-albuterol  3 mL Nebulization TID  . levETIRAcetam  500 mg Oral Q2000  . levothyroxine  88 mcg Oral QAC breakfast  . methylPREDNISolone (SOLU-MEDROL) injection  40 mg Intravenous Q24H  . oseltamivir  30 mg Oral Q M,W,F-2000  . pantoprazole  40 mg Oral Daily  . sevelamer carbonate  800 mg Oral TID WC  . simvastatin  10 mg Oral q1800  . sodium chloride  3 mL Intravenous Q12H  . sodium chloride  3 mL Intravenous Q12H   Continuous Infusions:   Principal Problem:   HAP (hospital-acquired pneumonia) Active Problems:   CAD   Chronic diastolic heart failure   ESRD   COPD exacerbation   Pneumonia    Time spent: >35 minutes     [name]  [facility] Pager [phone]. If 7PM-7AM, please contact night-coverage at [email or web address], password [email or web address] 02/17/2013, 8:12 AM  LOS: 1 day

## 2013-02-18 DIAGNOSIS — F17200 Nicotine dependence, unspecified, uncomplicated: Secondary | ICD-10-CM

## 2013-02-18 MED ORDER — HYDRALAZINE HCL 50 MG PO TABS
100.0000 mg | ORAL_TABLET | Freq: Three times a day (TID) | ORAL | Status: DC
  Administered 2013-02-18 – 2013-02-21 (×10): 100 mg via ORAL
  Filled 2013-02-18 (×12): qty 2

## 2013-02-18 MED ORDER — CALCIUM CARBONATE ANTACID 500 MG PO CHEW
1.0000 | CHEWABLE_TABLET | Freq: Two times a day (BID) | ORAL | Status: DC | PRN
  Filled 2013-02-18: qty 1

## 2013-02-18 MED ORDER — DM-GUAIFENESIN ER 30-600 MG PO TB12
1.0000 | ORAL_TABLET | Freq: Two times a day (BID) | ORAL | Status: DC | PRN
  Administered 2013-02-18 – 2013-02-20 (×3): 1 via ORAL
  Filled 2013-02-18 (×5): qty 1

## 2013-02-18 NOTE — Progress Notes (Signed)
Subjective:   Feeling good, breathing is better, just sleepy. Mucinex helped with cough last night  Objective Filed Vitals:   02/18/13 0337 02/18/13 0607 02/18/13 0627 02/18/13 0733  BP: 151/60 164/76    Pulse: 75 74    Temp: 98.8 F (37.1 C) 98.2 F (36.8 C)    TempSrc: Oral Oral    Resp: 17 18    Height:      Weight:  67.2 kg (148 lb 2.4 oz)    SpO2: 99% 95% 95% 94%   Physical Exam General: alert and oriented. Sitting up eating breakfast, no acute distress. Heart:RRR with S1 S2. No murmurs, rubs, or gallops appreciated. Lungs:Decreased in bilat lower lobes, few rales in RLL Breathing is unlabored. Abdomen: soft, nontender +BS Extremities:Trace LE edema.  Dialysis Access: L Hero +bruit/thrill   Dialysis Orders: MWF at [facility]  66.5 2K/2C 4hrs NO Heparin. L AV Hero 350/1.5  Hectorol 68mcg IV/HD Epogen 11000 Units IV/HD Venofer 100mg  qHd x10 (stop 2/20)   Assessment/Plan:  1. Pneumonia- chest xray- RLL infiltrate. Started cefepime, vanc.  Influenza negative, tamiflu Dc'd- per primary.  2. COPD-Neb treatments. steriod taper. 3. ESRD - MWF @ [place], HD yesterday 2.8L removed, pending tomorrow. K+4.6 No heparin.  4. Hypertension/volume - BP elevated 164/76, amlodipine, coreg, hydralazine. No significant volume overload 5. Anemia - hgb 10.2. Continue ESA. Tsat 13 1/28 Venofer 100mg  qHD x10- cont, stops 2/20 6. Metabolic bone disease - renvela with meals sensipar. hectorol- labs 1/28 phos 4.6, PTH 162 7. Nutrition - renal diet, multivitamin. Albumin 4.0 (1/28) 8. Seizure disorder- Keppra, per primary   Shelle Iron, NP [place] [phone] 02/18/2013,9:28 AM  LOS: 2 days    Additional Objective Labs: Basic Metabolic Panel:  Recent Labs Lab 02/16/13 1945 02/17/13 0506  NA 137 137  K 4.2 4.6  CL 92* 93*  CO2 27 25  GLUCOSE 97 161*  BUN 35* 41*  CREATININE 8.30* 9.09*  CALCIUM 10.7* 10.3   Liver Function Tests: No results found for this  basename: AST, ALT, ALKPHOS, BILITOT, PROT, ALBUMIN,  in the last 168 hours No results found for this basename: LIPASE, AMYLASE,  in the last 168 hours CBC:  Recent Labs Lab 02/16/13 1945 02/17/13 0506  WBC 5.1 3.2*  HGB 10.1* 10.2*  HCT 30.7* 31.2*  MCV 80.2 79.4  PLT 188 131*   Blood Culture    Component Value Date/Time   SDES BLOOD ARM RIGHT 06/27/2012 1835   SPECREQUEST BOTTLES DRAWN AEROBIC ONLY 4CC 06/27/2012 1835   CULT NO GROWTH 5 DAYS 06/27/2012 1835   REPTSTATUS 07/04/2012 FINAL 06/27/2012 1835    Cardiac Enzymes:  Recent Labs Lab 02/16/13 0002  TROPONINI <0.30   CBG: No results found for this basename: GLUCAP,  in the last 168 hours Iron Studies: No results found for this basename: IRON, TIBC, TRANSFERRIN, FERRITIN,  in the last 72 hours @lablastinr3 @ Studies/Results: Dg Chest Port 1 View  02/16/2013   CLINICAL DATA:  Shortness of breath  EXAM: PORTABLE CHEST - 1 VIEW  COMPARISON:  DG CHEST 2 VIEW dated 09/23/2012  FINDINGS: Low lung volumes. Increased density projects within the right lung base. The cardiac silhouette is enlarged. Atherosclerotic calcifications appreciated within the aorta. The osseous structures unremarkable.  IMPRESSION: Right lower lobe infiltrate. Stable cardiomegaly with technique taken into consideration.   Electronically Signed   By: Margaree Mackintosh M.D.   On: 02/16/2013 19:56   Medications:   . amLODipine  10 mg Oral QHS  .  aspirin EC  81 mg Oral Daily  . budesonide (PULMICORT) nebulizer solution  0.25 mg Nebulization BID  . carvedilol  25 mg Oral BID WC  . ceFEPime (MAXIPIME) IV  1 g Intravenous Q M,W,F-HD  . cinacalcet  60 mg Oral QHS  . darbepoetin  60 mcg Intravenous Q Mon-HD  . doxercalciferol  6 mcg Intravenous Q M,W,F-HD  . feeding supplement (NEPRO CARB STEADY)  237 mL Oral BID  . ferric gluconate (FERRLECIT/NULECIT) IV  125 mg Intravenous Q M,W,F-HD  . hydrALAZINE  100 mg Oral TID  . ipratropium-albuterol  3 mL Nebulization  TID  . levETIRAcetam  500 mg Oral Q2000  . levothyroxine  88 mcg Oral QAC breakfast  . multivitamin  1 tablet Oral QHS  . pantoprazole  40 mg Oral Daily  . predniSONE  10 mg Oral Q breakfast  . sevelamer carbonate  3,200 mg Oral TID WC  . simvastatin  10 mg Oral q1800  . sodium chloride  3 mL Intravenous Q12H  . sodium chloride  3 mL Intravenous Q12H  . vancomycin  750 mg Intravenous Q M,W,F-HD   Renal Allending 02/18/13 Stable with ongoing coughing. Agree with note articulated by K. Cletus Gash, [facility] Feeling better. Next HD needed on Wed. [name] C

## 2013-02-18 NOTE — Progress Notes (Addendum)
[facility] PROGRESS NOTE  [name] MHD:[id number] DOB: 1945-03-01 DOA: 02/16/2013 PCP: [name], MD  Assessment/Plan: 68 y.o. female history of HTN, COPD, smoker, ESRD on hemodialysis on Monday Wednesday and Friday presents to the ER with complaints of increasing shortness of breath and wheezing with productive cough. Chest x-ray showing possibility of pneumonia and patient has been started antibiotics and admitted for further management.   Principal Problem:  HAP (hospital-acquired pneumonia)  Active Problems:  CAD  Chronic diastolic heart failure  ESRD  COPD exacerbation  Pneumonia    1. HCAP; QJJ:HERDE lower lobe infiltrate -improving; cont cefepime, vanc;d/cTamiflu influenza PCR is negative  2. COPD, no wheezing on exam; taper steroids, cont nebulizers. Stop smoking; desat study prior to discharge  3. HTN uncontrolled -resume home meds; increased hydralazine to 100 TID,. cont BB, amlodipine; start HD, p[rn hydralazine; cont HD  4. CAD status post stenting - denies any chest pain. Cont home regimen  5. ESRD on hemodialysis on Monday Wednesday and Friday ; per nephrology  6. H/o diastolic CHF last EF measured in 2013 was 50-55% with 2-D echo showing severe pulmonary hypertension; clinically euvolemic; cont HD for volume control;  7. history of seizures - continue present medications  Code Status: full Family Communication: d/w patient, called updated [name] Daughter [phone] (indicate person spoken with, relationship, and if by phone, the number) Disposition Plan: home when improved, 24-48 hours    Consultants:  Nephrology   Procedures:  Pend HD  Antibiotics:  Cefepime 2/15<<<<<  vanc 2/15<<<<   (indicate start date, and stop date if known)  HPI/Subjective: alert  Objective: Filed Vitals:   02/18/13 0607  BP: 164/76  Pulse: 74  Temp: 98.2 F (36.8 C)  Resp: 18    Intake/Output Summary (Last 24 hours) at 02/18/13 0917 Last  data filed at 02/17/13 2225  Gross per 24 hour  Intake    240 ml  Output   2834 ml  Net  -2594 ml   Filed Weights   02/17/13 1820 02/17/13 2225 02/18/13 0607  Weight: 69.4 kg (153 lb) 66.5 kg (146 lb 9.7 oz) 67.2 kg (148 lb 2.4 oz)    Exam:   General:  alert  Cardiovascular: s1,s2 rrr  Respiratory: few rales inL lung   Abdomen: soft, nt, nd   Musculoskeletal: no edema   Data Reviewed: Basic Metabolic Panel:  Recent Labs Lab 02/16/13 1945 02/17/13 0506  NA 137 137  K 4.2 4.6  CL 92* 93*  CO2 27 25  GLUCOSE 97 161*  BUN 35* 41*  CREATININE 8.30* 9.09*  CALCIUM 10.7* 10.3   Liver Function Tests: No results found for this basename: AST, ALT, ALKPHOS, BILITOT, PROT, ALBUMIN,  in the last 168 hours No results found for this basename: LIPASE, AMYLASE,  in the last 168 hours No results found for this basename: AMMONIA,  in the last 168 hours CBC:  Recent Labs Lab 02/16/13 1945 02/17/13 0506  WBC 5.1 3.2*  HGB 10.1* 10.2*  HCT 30.7* 31.2*  MCV 80.2 79.4  PLT 188 131*   Cardiac Enzymes:  Recent Labs Lab 02/16/13 0002  TROPONINI <0.30   BNP (last 3 results)  Recent Labs  09/23/12 0450 02/16/13 1945  PROBNP 10221.0* 31758.0*   CBG: No results found for this basename: GLUCAP,  in the last 168 hours  No results found for this or any previous visit (from the past 240 hour(s)).   Studies: Dg Chest Port 1 View  02/16/2013   CLINICAL DATA:  Shortness of breath  EXAM: PORTABLE CHEST - 1 VIEW  COMPARISON:  DG CHEST 2 VIEW dated 09/23/2012  FINDINGS: Low lung volumes. Increased density projects within the right lung base. The cardiac silhouette is enlarged. Atherosclerotic calcifications appreciated within the aorta. The osseous structures unremarkable.  IMPRESSION: Right lower lobe infiltrate. Stable cardiomegaly with technique taken into consideration.   Electronically Signed   By: Margaree Mackintosh M.D.   On: 02/16/2013 19:56    Scheduled Meds: . amLODipine   10 mg Oral QHS  . aspirin EC  81 mg Oral Daily  . budesonide (PULMICORT) nebulizer solution  0.25 mg Nebulization BID  . carvedilol  25 mg Oral BID WC  . ceFEPime (MAXIPIME) IV  1 g Intravenous Q M,W,F-HD  . cinacalcet  60 mg Oral QHS  . darbepoetin  60 mcg Intravenous Q Mon-HD  . doxercalciferol  6 mcg Intravenous Q M,W,F-HD  . feeding supplement (NEPRO CARB STEADY)  237 mL Oral BID  . ferric gluconate (FERRLECIT/NULECIT) IV  125 mg Intravenous Q M,W,F-HD  . hydrALAZINE  75 mg Oral TID  . ipratropium-albuterol  3 mL Nebulization TID  . levETIRAcetam  500 mg Oral Q2000  . levothyroxine  88 mcg Oral QAC breakfast  . multivitamin  1 tablet Oral QHS  . oseltamivir  30 mg Oral Q M,W,F-2000  . pantoprazole  40 mg Oral Daily  . predniSONE  10 mg Oral Q breakfast  . sevelamer carbonate  3,200 mg Oral TID WC  . simvastatin  10 mg Oral q1800  . sodium chloride  3 mL Intravenous Q12H  . sodium chloride  3 mL Intravenous Q12H  . vancomycin  750 mg Intravenous Q M,W,F-HD   Continuous Infusions:   Principal Problem:   HAP (hospital-acquired pneumonia) Active Problems:   CAD   Chronic diastolic heart failure   ESRD   COPD exacerbation   Pneumonia    Time spent: >35 minutes     [name]  [facility] Pager [phone]. If 7PM-7AM, please contact night-coverage at [email or web address], password [facility] 02/18/2013, 9:17 AM  LOS: 2 days

## 2013-02-19 LAB — RENAL FUNCTION PANEL
Albumin: 2.8 g/dL — ABNORMAL LOW (ref 3.5–5.2)
BUN: 44 mg/dL — AB (ref 6–23)
CO2: 26 mEq/L (ref 19–32)
Calcium: 9.5 mg/dL (ref 8.4–10.5)
Chloride: 95 mEq/L — ABNORMAL LOW (ref 96–112)
Creatinine, Ser: 7.54 mg/dL — ABNORMAL HIGH (ref 0.50–1.10)
GFR calc non Af Amer: 5 mL/min — ABNORMAL LOW (ref >90)
GFR, EST AFRICAN AMERICAN: 6 mL/min — AB (ref >90)
Glucose, Bld: 104 mg/dL — ABNORMAL HIGH (ref 70–99)
PHOSPHORUS: 3.3 mg/dL (ref 2.3–4.6)
Potassium: 4.4 mEq/L (ref 3.7–5.3)
SODIUM: 138 meq/L (ref 137–147)

## 2013-02-19 LAB — CBC
HCT: 29.4 % — ABNORMAL LOW (ref 36.0–46.0)
Hemoglobin: 9.3 g/dL — ABNORMAL LOW (ref 12.0–15.0)
MCH: 25.2 pg — AB (ref 26.0–34.0)
MCHC: 31.6 g/dL (ref 30.0–36.0)
MCV: 79.7 fL (ref 78.0–100.0)
Platelets: 181 10*3/uL (ref 150–400)
RBC: 3.69 MIL/uL — ABNORMAL LOW (ref 3.87–5.11)
RDW: 17 % — ABNORMAL HIGH (ref 11.5–15.5)
WBC: 4.7 10*3/uL (ref 4.0–10.5)

## 2013-02-19 MED ORDER — DOXERCALCIFEROL 4 MCG/2ML IV SOLN
INTRAVENOUS | Status: AC
  Filled 2013-02-19: qty 4

## 2013-02-19 MED ORDER — LIDOCAINE HCL (PF) 1 % IJ SOLN: 5.0000 mL | INTRAMUSCULAR | Status: DC | PRN

## 2013-02-19 MED ORDER — PENTAFLUOROPROP-TETRAFLUOROETH EX AERO: 1.0000 "application " | INHALATION_SPRAY | CUTANEOUS | Status: DC | PRN

## 2013-02-19 MED ORDER — SODIUM CHLORIDE 0.9 % IV SOLN: 100.0000 mL | INTRAVENOUS | Status: DC | PRN

## 2013-02-19 MED ORDER — NITROGLYCERIN 0.4 MG SL SUBL: 0.4000 mg | SUBLINGUAL_TABLET | SUBLINGUAL | Status: DC | PRN

## 2013-02-19 MED ORDER — PANTOPRAZOLE SODIUM 20 MG PO TBEC
20.0000 mg | DELAYED_RELEASE_TABLET | Freq: Every day | ORAL | Status: DC
  Administered 2013-02-19 – 2013-02-21 (×3): 20 mg via ORAL
  Filled 2013-02-19 (×3): qty 1

## 2013-02-19 MED ORDER — LIDOCAINE-PRILOCAINE 2.5-2.5 % EX CREA: 1.0000 "application " | TOPICAL_CREAM | CUTANEOUS | Status: DC | PRN

## 2013-02-19 MED ORDER — ALTEPLASE 2 MG IJ SOLR: 2.0000 mg | Freq: Once | INTRAMUSCULAR | Status: DC | PRN

## 2013-02-19 MED ORDER — LEVOFLOXACIN 500 MG PO TABS
500.0000 mg | ORAL_TABLET | ORAL | Status: DC
  Administered 2013-02-19 – 2013-02-21 (×2): 500 mg via ORAL
  Filled 2013-02-19 (×2): qty 1

## 2013-02-19 MED ORDER — NEPRO/CARBSTEADY PO LIQD: 237.0000 mL | ORAL | Status: DC | PRN

## 2013-02-19 NOTE — Progress Notes (Signed)
Pt had 8 beats of Vtach, asymptomatic, having a BM when it happened, pt stated " I feel better after that BM because I havent had the BM since Sunday".  EFrederik Pear ( Night Coverage on call) was notified. Will continue to monitor pt.

## 2013-02-19 NOTE — Progress Notes (Signed)
Subjective:   Slept well but still tired. Wants to get up to HD. Breathing better but still coughing a lot  Objective Filed Vitals:   02/18/13 2031 02/19/13 0555 02/19/13 0850 02/19/13 0900  BP: 154/69 155/64  138/61  Pulse: 73 71  70  Temp: 98.2 F (36.8 C) 98.1 F (36.7 C)  97.5 F (36.4 C)  TempSrc: Oral Oral  Oral  Resp: 20 16  18   Height:      Weight:  68.448 kg (150 lb 14.4 oz)    SpO2: 100% 95% 91% 98%    Physical Exam  General: alert and oriented. Resting in bed, no acute distress.  Heart:RRR with S1 S2. No murmurs, rubs, or gallops appreciated. Slight JVD Lungs: few rales and decreased in bilat lower lobes,  Breathing is unlabored. Frequent dry cough Abdomen: soft, nontender +BS  Extremities:Trace LE edema.  Dialysis Access: L Hero +bruit/thrill   Dialysis Orders: MWF at [facility].  66.5 2K/2C 4hrs NO Heparin. L AV Hero 350/1.5  Hectorol 47mcg IV/HD Epogen 11000 Units IV/HD Venofer 100mg  qHd x10 (stop 2/20)   Assessment/Plan:  1. Pneumonia- chest xray- RLL infiltrate. Started cefepime, vanc. Now only on levaquin. Afebrile. Influenza negative, tamiflu Dc'd- per primary.  2. COPD-Neb treatments. steriod taper. 3. ESRD - MWF @ [place], HD pending today. K+4.6 No heparin.  4. Hypertension/volume - BP  138/61, amlodipine, coreg, hydralazine. No significant volume overload 5. Anemia - hgb 10.2. Continue ESA. Tsat 13 1/28 Venofer 100mg  qHD x10- cont, stops 2/20 6. Metabolic bone disease - renvela with meals sensipar. hectorol- labs 1/28 phos 4.6, PTH 162 7. Nutrition - renal diet, nepro. multivitamin. Albumin 4.0 (1/28) 8. Seizure disorder- Keppra, per primary   Shelle Iron, NP [place] [phone] 02/19/2013,10:27 AM  LOS: 3 days   Renal Attending: Stable. Cough improved. Lungs with rhonchi bilat. For HD later today. [place]  Additional Objective Labs: Basic Metabolic Panel:  Recent Labs Lab 02/16/13 1945 02/17/13 0506  NA 137 137  K 4.2  4.6  CL 92* 93*  CO2 27 25  GLUCOSE 97 161*  BUN 35* 41*  CREATININE 8.30* 9.09*  CALCIUM 10.7* 10.3   Liver Function Tests: No results found for this basename: AST, ALT, ALKPHOS, BILITOT, PROT, ALBUMIN,  in the last 168 hours No results found for this basename: LIPASE, AMYLASE,  in the last 168 hours CBC:  Recent Labs Lab 02/16/13 1945 02/17/13 0506  WBC 5.1 3.2*  HGB 10.1* 10.2*  HCT 30.7* 31.2*  MCV 80.2 79.4  PLT 188 131*   Blood Culture    Component Value Date/Time   SDES BLOOD ARM RIGHT 06/27/2012 1835   SPECREQUEST BOTTLES DRAWN AEROBIC ONLY 4CC 06/27/2012 1835   CULT NO GROWTH 5 DAYS 06/27/2012 1835   REPTSTATUS 07/04/2012 FINAL 06/27/2012 1835    Cardiac Enzymes:  Recent Labs Lab 02/16/13 0002  TROPONINI <0.30   CBG: No results found for this basename: GLUCAP,  in the last 168 hours Iron Studies: No results found for this basename: IRON, TIBC, TRANSFERRIN, FERRITIN,  in the last 72 hours @lablastinr3 @ Studies/Results: No results found. Medications:   . amLODipine  10 mg Oral QHS  . aspirin EC  81 mg Oral Daily  . budesonide (PULMICORT) nebulizer solution  0.25 mg Nebulization BID  . carvedilol  25 mg Oral BID WC  . cinacalcet  60 mg Oral QHS  . darbepoetin  60 mcg Intravenous Q Mon-HD  . doxercalciferol  6 mcg Intravenous Q M,W,F-HD  .  feeding supplement (NEPRO CARB STEADY)  237 mL Oral BID  . ferric gluconate (FERRLECIT/NULECIT) IV  125 mg Intravenous Q M,W,F-HD  . hydrALAZINE  100 mg Oral TID  . ipratropium-albuterol  3 mL Nebulization TID  . levETIRAcetam  500 mg Oral Q2000  . levofloxacin  500 mg Oral Q48H  . levothyroxine  88 mcg Oral QAC breakfast  . multivitamin  1 tablet Oral QHS  . pantoprazole  20 mg Oral Daily  . predniSONE  10 mg Oral Q breakfast  . sevelamer carbonate  3,200 mg Oral TID WC  . simvastatin  10 mg Oral q1800

## 2013-02-19 NOTE — Progress Notes (Signed)
Pt tolerated dialysis well RN says 3 liters off. Says she feels very good tonight.

## 2013-02-19 NOTE — Progress Notes (Signed)
ABG drawn on 1 lpm n/c per t.o. DR. Olevia Bowens.

## 2013-02-19 NOTE — Progress Notes (Signed)
[facility] PROGRESS NOTE Interim History: 68 y.o. female history of HTN, COPD, smoker, ESRD on hemodialysis on Monday Wednesday and Friday presents to the ER with complaints of increasing shortness of breath and wheezing with productive cough. Chest x-ray showing possibility of pneumonia and patient has been started antibiotics and admitted for further management  Assessment/Plan: HCAP (hospital-acquired pneumonia): - vanc and cefepime started on 1.15.2015. - has remained afebrile. - Influenza PCR negative. De-escalate treatment to levaquin. Check ABG  COPD exacerbation: - no wheezing on exam; taper steroids, cont nebulizers  Chronic diastolic heart failure - 1856 was 50-55% with 2-D echo showing severe pulmonary hypertension; clinically euvolemic  ESRD - per renal. - still fluid overloaded    Code Status: full  Family Communication: d/w patient, called updated Edwards,[name] Daughter [phone] (indicate person spoken with, relationship, and if by phone, the number)  Disposition Plan: inpatient   Consultants:  nephrology  Procedures:  HD  Antibiotics: Cefepime &  vanc 2/15>>2.18 levaquin 2.18>>   HPI/Subjective: complains of sleepiness  Objective: Filed Vitals:   02/18/13 1959 02/18/13 2031 02/19/13 0555 02/19/13 0850  BP:  154/69 155/64   Pulse:  73 71   Temp:  98.2 F (36.8 C) 98.1 F (36.7 C)   TempSrc:  Oral Oral   Resp:  20 16   Height:      Weight:   68.448 kg (150 lb 14.4 oz)   SpO2: 94% 100% 95% 91%    Intake/Output Summary (Last 24 hours) at 02/19/13 1003 Last data filed at 02/18/13 2200  Gross per 24 hour  Intake    720 ml  Output      5 ml  Net    715 ml   Filed Weights   02/17/13 2225 02/18/13 0607 02/19/13 0555  Weight: 66.5 kg (146 lb 9.7 oz) 67.2 kg (148 lb 2.4 oz) 68.448 kg (150 lb 14.4 oz)    Exam:  General: Alert, awake, oriented x3, in no acute distress.  HEENT: No bruits, no goiter. +JVD Heart: Regular rate and  rhythm, without murmurs, rubs, gallops.  Lungs: Good air movement, no breath sound on right crackles on left Abdomen: Soft, nontender, nondistended, positive bowel sounds.   Data Reviewed: Basic Metabolic Panel:  Recent Labs Lab 02/16/13 1945 02/17/13 0506  NA 137 137  K 4.2 4.6  CL 92* 93*  CO2 27 25  GLUCOSE 97 161*  BUN 35* 41*  CREATININE 8.30* 9.09*  CALCIUM 10.7* 10.3   Liver Function Tests: No results found for this basename: AST, ALT, ALKPHOS, BILITOT, PROT, ALBUMIN,  in the last 168 hours No results found for this basename: LIPASE, AMYLASE,  in the last 168 hours No results found for this basename: AMMONIA,  in the last 168 hours CBC:  Recent Labs Lab 02/16/13 1945 02/17/13 0506  WBC 5.1 3.2*  HGB 10.1* 10.2*  HCT 30.7* 31.2*  MCV 80.2 79.4  PLT 188 131*   Cardiac Enzymes:  Recent Labs Lab 02/16/13 0002  TROPONINI <0.30   BNP (last 3 results)  Recent Labs  09/23/12 0450 02/16/13 1945  PROBNP 10221.0* 31758.0*   CBG: No results found for this basename: GLUCAP,  in the last 168 hours  No results found for this or any previous visit (from the past 240 hour(s)).   Studies: No results found.  Scheduled Meds: . amLODipine  10 mg Oral QHS  . aspirin EC  81 mg Oral Daily  . budesonide (PULMICORT) nebulizer solution  0.25 mg Nebulization BID  .  carvedilol  25 mg Oral BID WC  . cinacalcet  60 mg Oral QHS  . darbepoetin  60 mcg Intravenous Q Mon-HD  . doxercalciferol  6 mcg Intravenous Q M,W,F-HD  . feeding supplement (NEPRO CARB STEADY)  237 mL Oral BID  . ferric gluconate (FERRLECIT/NULECIT) IV  125 mg Intravenous Q M,W,F-HD  . hydrALAZINE  100 mg Oral TID  . ipratropium-albuterol  3 mL Nebulization TID  . levETIRAcetam  500 mg Oral Q2000  . levofloxacin  500 mg Oral Q48H  . levothyroxine  88 mcg Oral QAC breakfast  . multivitamin  1 tablet Oral QHS  . pantoprazole  20 mg Oral Daily  . predniSONE  10 mg Oral Q breakfast  . sevelamer  carbonate  3,200 mg Oral TID WC  . simvastatin  10 mg Oral q1800   Continuous Infusions:    Charlynne Cousins  [facility] Pager [phone]. If 8PM-8AM, please contact night-coverage at [email or web address], password [facility] 02/19/2013, 10:03 AM  LOS: 3 days

## 2013-02-20 ENCOUNTER — Inpatient Hospital Stay (HOSPITAL_COMMUNITY): Payer: Medicare Other

## 2013-02-20 DIAGNOSIS — J9600 Acute respiratory failure, unspecified whether with hypoxia or hypercapnia: Secondary | ICD-10-CM

## 2013-02-20 LAB — BLOOD GAS, ARTERIAL
ACID-BASE EXCESS: 2.5 mmol/L — AB (ref 0.0–2.0)
BICARBONATE: 26.7 meq/L — AB (ref 20.0–24.0)
Drawn by: 225631
O2 Content: 1 L/min
O2 SAT: 91.7 %
PATIENT TEMPERATURE: 98.6
PH ART: 7.414 (ref 7.350–7.450)
TCO2: 28 mmol/L (ref 0–100)
pCO2 arterial: 42.5 mmHg (ref 35.0–45.0)
pO2, Arterial: 65.6 mmHg — ABNORMAL LOW (ref 80.0–100.0)

## 2013-02-20 MED ORDER — VALACYCLOVIR HCL 500 MG PO TABS
500.0000 mg | ORAL_TABLET | Freq: Every day | ORAL | Status: DC
  Administered 2013-02-20: 500 mg via ORAL
  Filled 2013-02-20 (×2): qty 1

## 2013-02-20 NOTE — Care Management Note (Addendum)
  Page 1 of 1   02/20/2013     3:31:15 PM   CARE MANAGEMENT NOTE 02/20/2013  Patient:  Bleicher,Candas   Account Number:  [id number]  Date Initiated:  02/20/2013  Documentation initiated by:  Wesly Whisenant  Subjective/Objective Assessment:   Admitted with HAP, SOB, COPD, fluid overload.  Hx/o HD M-W-F     Action/Plan:   CM to follow for disposition needs   Anticipated DC Date:  02/23/2013   Anticipated DC Plan:  HOME/SELF CARE         Choice offered to / List presented to:             Status of service:  In process, will continue to follow Medicare Important Message given?   (If response is "NO", the following Medicare IM given date fields will be blank) Date Medicare IM given:   Date Additional Medicare IM given:    Discharge Disposition:    Per UR Regulation:  Reviewed for med. necessity/level of care/duration of stay  If discussed at [place] of Stay Meetings, dates discussed:    Comments:  02/20/2013 Social:  From [employer] for today:  Fluid overload:  extra HD today Dispositon Plan:  HD M-W-F ADD: +2-3 Mariann Laster, RN, BSN, [place], CCM  02/20/2013

## 2013-02-20 NOTE — Progress Notes (Signed)
Patient notified nurse that her shingles have come back on her thigh. Patient is a retired [employer] scientist and states that when it comes back she knows exactly what it is and the tingling pain from it. Patient states she either uses acyclovir cream or the pill. Notified doctor. Orders given for Valtrex and has been administered. Patient also received Trazodone and mucinex per PRN order per patient request. Patient has been placed on Airborne and contact precautions and moved into a negative pressure room per the shingles. Explained to patient why she needs to be in negative pressure room and on contact precautions, patient understood. Will continue to monitor patient to end of shift.

## 2013-02-20 NOTE — Progress Notes (Signed)
Assessment/Plan:  1. Pneumonia- chest xray- RLL infiltrate. Started cefepime, vanc. Now only on levaquin. Afebrile. Influenza negative, tamiflu Dc'd- per primary.  2. COPD-Neb treatments. steriod taper. 3.   ESRD - MWF @ [place], HD again today to establish new EDW  Subjective: Interval History: Still SOB and orthopnea  Objective: Vital signs in last 24 hours: Temp:  [97.6 F (36.4 C)-98.3 F (36.8 C)] 98.2 F (36.8 C) (02/19 0642) Pulse Rate:  [60-78] 69 (02/19 0642) Resp:  [18-27] 20 (02/19 0642) BP: (124-166)/(62-75) 153/67 mmHg (02/19 0642) SpO2:  [92 %-98 %] 92 % (02/19 0733) Weight:  [65.953 kg (145 lb 6.4 oz)-68.7 kg (151 lb 7.3 oz)] 65.953 kg (145 lb 6.4 oz) (02/19 9798) EDW 66.5kg Weight change: 0.252 kg (8.9 oz)  Intake/Output from previous day: 02/18 0701 - 02/19 0700 In: 840 [P.O.:840] Out: 3017 [Urine:10] Intake/Output this shift: Total I/O In: 360 [P.O.:360] Out: -   General appearance: alert and cooperative Chest wall: no tenderness Cardio: regular rate and rhythm, S1, S2 normal, no murmur, click, rub or gallop GI: soft, non-tender; bowel sounds normal; no masses,  no organomegaly Lungs decrease BS and wheezes  Lab Results:  Recent Labs  02/19/13 1344  WBC 4.7  HGB 9.3*  HCT 29.4*  PLT 181   BMET:  Recent Labs  02/19/13 1344  NA 138  K 4.4  CL 95*  CO2 26  GLUCOSE 104*  BUN 44*  CREATININE 7.54*  CALCIUM 9.5   No results found for this basename: PTH,  in the last 72 hours Iron Studies: No results found for this basename: IRON, TIBC, TRANSFERRIN, FERRITIN,  in the last 72 hours Studies/Results: No results found.  Scheduled: . amLODipine  10 mg Oral QHS  . aspirin EC  81 mg Oral Daily  . budesonide (PULMICORT) nebulizer solution  0.25 mg Nebulization BID  . carvedilol  25 mg Oral BID WC  . cinacalcet  60 mg Oral QHS  . darbepoetin  60 mcg Intravenous Q Mon-HD  . doxercalciferol  6 mcg Intravenous Q M,W,F-HD  . feeding supplement (NEPRO  CARB STEADY)  237 mL Oral BID  . ferric gluconate (FERRLECIT/NULECIT) IV  125 mg Intravenous Q M,W,F-HD  . hydrALAZINE  100 mg Oral TID  . ipratropium-albuterol  3 mL Nebulization TID  . levETIRAcetam  500 mg Oral Q2000  . levofloxacin  500 mg Oral Q48H  . levothyroxine  88 mcg Oral QAC breakfast  . multivitamin  1 tablet Oral QHS  . pantoprazole  20 mg Oral Daily  . predniSONE  10 mg Oral Q breakfast  . sevelamer carbonate  3,200 mg Oral TID WC  . simvastatin  10 mg Oral q1800     LOS: 4 days   Halima Fogal C 02/20/2013,9:55 AM

## 2013-02-20 NOTE — Progress Notes (Addendum)
[facility] PROGRESS NOTE Interim History: 68 y.o. female history of HTN, COPD, smoker, ESRD on hemodialysis on Monday Wednesday and Friday presents to the ER with complaints of increasing shortness of breath and wheezing with productive cough. Chest x-ray showing possibility of pneumonia and patient has been started antibiotics and admitted for further management  Assessment/Plan: HCAP (hospital-acquired pneumonia): - vanc and cefepime started on 1.15.2015. - has remained afebrile. - Influenza PCR negative. De-escalate treatment to levaquin.  - afebrile.   COPD exacerbation: - no wheezing on exam; taper steroids, cont nebulizers  Chronic diastolic heart failure - 3016 was 50-55% with 2-D echo showing severe pulmonary hypertension; clinically euvolemic  ESRD - per renal. Will need further HD. - still fluid overloaded, +JVD. Requires O 2 with ambulation. She has not required O2 at home. - cont to complains of SOB.    Code Status: full  Family Communication: d/w patient, called updated [name] Daughter [phone]  Disposition Plan: inpatient   Consultants:  nephrology  Procedures:  HD  Antibiotics: Cefepime &  vanc 2/15>>2.18 levaquin 2.18>>   HPI/Subjective: She is complaining of SOB and orthopnea.  Objective: Filed Vitals:   02/19/13 2207 02/20/13 3220 02/20/13 0642 02/20/13 0733  BP: 133/62  153/67   Pulse: 70  69   Temp: 98.2 F (36.8 C)  98.2 F (36.8 C)   TempSrc: Oral  Oral   Resp: 20  20   Height:      Weight:  65.953 kg (145 lb 6.4 oz)    SpO2: 97%  95% 92%    Intake/Output Summary (Last 24 hours) at 02/20/13 0746 Last data filed at 02/19/13 2300  Gross per 24 hour  Intake    840 ml  Output   3017 ml  Net  -2177 ml   Filed Weights   02/19/13 0555 02/19/13 1325 02/20/13 0638  Weight: 68.448 kg (150 lb 14.4 oz) 68.7 kg (151 lb 7.3 oz) 65.953 kg (145 lb 6.4 oz)    Exam:  General: Alert, awake, oriented x3, in no acute  distress.  HEENT: No bruits, no goiter. +JVD Heart: Regular rate and rhythm, without murmurs, rubs, gallops.  Lungs: Good air movement, no breath sound on right crackles on left Abdomen: Soft, nontender, nondistended, positive bowel sounds.   Data Reviewed: Basic Metabolic Panel:  Recent Labs Lab 02/16/13 1945 02/17/13 0506 02/19/13 1344  NA 137 137 138  K 4.2 4.6 4.4  CL 92* 93* 95*  CO2 27 25 26   GLUCOSE 97 161* 104*  BUN 35* 41* 44*  CREATININE 8.30* 9.09* 7.54*  CALCIUM 10.7* 10.3 9.5  PHOS  --   --  3.3   Liver Function Tests:  Recent Labs Lab 02/19/13 1344  ALBUMIN 2.8*   No results found for this basename: LIPASE, AMYLASE,  in the last 168 hours No results found for this basename: AMMONIA,  in the last 168 hours CBC:  Recent Labs Lab 02/16/13 1945 02/17/13 0506 02/19/13 1344  WBC 5.1 3.2* 4.7  HGB 10.1* 10.2* 9.3*  HCT 30.7* 31.2* 29.4*  MCV 80.2 79.4 79.7  PLT 188 131* 181   Cardiac Enzymes:  Recent Labs Lab 02/16/13 0002  TROPONINI <0.30   BNP (last 3 results)  Recent Labs  09/23/12 0450 02/16/13 1945  PROBNP 10221.0* 31758.0*   CBG: No results found for this basename: GLUCAP,  in the last 168 hours  No results found for this or any previous visit (from the past 240 hour(s)).   Studies: No  results found.  Scheduled Meds: . amLODipine  10 mg Oral QHS  . aspirin EC  81 mg Oral Daily  . budesonide (PULMICORT) nebulizer solution  0.25 mg Nebulization BID  . carvedilol  25 mg Oral BID WC  . cinacalcet  60 mg Oral QHS  . darbepoetin  60 mcg Intravenous Q Mon-HD  . doxercalciferol  6 mcg Intravenous Q M,W,F-HD  . feeding supplement (NEPRO CARB STEADY)  237 mL Oral BID  . ferric gluconate (FERRLECIT/NULECIT) IV  125 mg Intravenous Q M,W,F-HD  . hydrALAZINE  100 mg Oral TID  . ipratropium-albuterol  3 mL Nebulization TID  . levETIRAcetam  500 mg Oral Q2000  . levofloxacin  500 mg Oral Q48H  . levothyroxine  88 mcg Oral QAC breakfast   . multivitamin  1 tablet Oral QHS  . pantoprazole  20 mg Oral Daily  . predniSONE  10 mg Oral Q breakfast  . sevelamer carbonate  3,200 mg Oral TID WC  . simvastatin  10 mg Oral q1800   Continuous Infusions:    Charlynne Cousins  [facility] Pager [phone]. If 8PM-8AM, please contact night-coverage at [email or web address], password [facility] 02/20/2013, 7:46 AM  LOS: 4 days

## 2013-02-21 LAB — CBC
HEMATOCRIT: 33 % — AB (ref 36.0–46.0)
Hemoglobin: 10.5 g/dL — ABNORMAL LOW (ref 12.0–15.0)
MCH: 25.5 pg — AB (ref 26.0–34.0)
MCHC: 31.8 g/dL (ref 30.0–36.0)
MCV: 80.3 fL (ref 78.0–100.0)
PLATELETS: 247 10*3/uL (ref 150–400)
RBC: 4.11 MIL/uL (ref 3.87–5.11)
RDW: 17.3 % — ABNORMAL HIGH (ref 11.5–15.5)
WBC: 6 10*3/uL (ref 4.0–10.5)

## 2013-02-21 LAB — RENAL FUNCTION PANEL
Albumin: 3 g/dL — ABNORMAL LOW (ref 3.5–5.2)
BUN: 28 mg/dL — ABNORMAL HIGH (ref 6–23)
CALCIUM: 10.2 mg/dL (ref 8.4–10.5)
CO2: 28 mEq/L (ref 19–32)
Chloride: 94 mEq/L — ABNORMAL LOW (ref 96–112)
Creatinine, Ser: 5.39 mg/dL — ABNORMAL HIGH (ref 0.50–1.10)
GFR, EST AFRICAN AMERICAN: 9 mL/min — AB (ref >90)
GFR, EST NON AFRICAN AMERICAN: 7 mL/min — AB (ref >90)
GLUCOSE: 139 mg/dL — AB (ref 70–99)
PHOSPHORUS: 3.1 mg/dL (ref 2.3–4.6)
POTASSIUM: 3.8 meq/L (ref 3.7–5.3)
SODIUM: 137 meq/L (ref 137–147)

## 2013-02-21 MED ORDER — PENTAFLUOROPROP-TETRAFLUOROETH EX AERO: 1.0000 "application " | INHALATION_SPRAY | CUTANEOUS | Status: DC | PRN

## 2013-02-21 MED ORDER — LIDOCAINE-PRILOCAINE 2.5-2.5 % EX CREA: 1.0000 "application " | TOPICAL_CREAM | CUTANEOUS | Status: DC | PRN

## 2013-02-21 MED ORDER — NEPRO/CARBSTEADY PO LIQD
237.0000 mL | ORAL | Status: DC | PRN
  Filled 2013-02-21: qty 237

## 2013-02-21 MED ORDER — HEPARIN SODIUM (PORCINE) 1000 UNIT/ML DIALYSIS: 1000.0000 [IU] | INTRAMUSCULAR | Status: DC | PRN

## 2013-02-21 MED ORDER — VALACYCLOVIR HCL 500 MG PO TABS: 500.0000 mg | ORAL_TABLET | Freq: Every day | ORAL | Status: DC

## 2013-02-21 MED ORDER — DOXERCALCIFEROL 4 MCG/2ML IV SOLN
INTRAVENOUS | Status: AC
  Filled 2013-02-21: qty 4

## 2013-02-21 MED ORDER — ALTEPLASE 2 MG IJ SOLR
2.0000 mg | Freq: Once | INTRAMUSCULAR | Status: DC | PRN
  Filled 2013-02-21: qty 2

## 2013-02-21 MED ORDER — LIDOCAINE HCL (PF) 1 % IJ SOLN: 5.0000 mL | INTRAMUSCULAR | Status: DC | PRN

## 2013-02-21 MED ORDER — SODIUM CHLORIDE 0.9 % IV SOLN: 100.0000 mL | INTRAVENOUS | Status: DC | PRN

## 2013-02-21 MED ORDER — LEVOFLOXACIN 500 MG PO TABS: 500.0000 mg | ORAL_TABLET | ORAL | Status: DC

## 2013-02-21 NOTE — Progress Notes (Signed)
Patient evaluated for community based chronic disease management services with [facility] as a benefit of patient's [employer]. Spoke with patient at bedside to explain [facility] services.  Past medical history includes COPD.  This admission patient has acute PNA.  She is not able to reach her PCP office for acute symptom management.  Requested attending to refer to pulmonology for ambulatory management.  Services accepted and written consents obtained and alternate contacts.  Patient will receive a post discharge transition of care call and will be evaluated for monthly home visits for assessments and disease process education.  Left contact information and [facility] literature at bedside. Made Inpatient Case Manager aware that [facility] following. Of note, [facility] Care Management services does not replace or interfere with any services that are arranged by inpatient case management or social work.  For additional questions or referrals please contact Corliss Blacker BSN RN [facility] Liaison at [phone].

## 2013-02-21 NOTE — Progress Notes (Signed)
Subjective:   Breathing much better, coughing less, denies sob and orthopnea. Has shingles on R thigh  Objective Filed Vitals:   02/21/13 0454 02/21/13 0518 02/21/13 0956 02/21/13 0958  BP:  160/59  138/66  Pulse:  57    Temp:  97.8 F (36.6 C)    TempSrc:  Oral    Resp:  18    Height:      Weight: 64.5 kg (142 lb 3.2 oz)     SpO2:  96% 98%    Physical Exam General: alert and oriented. Resting in bed, no acute distress. Heart:  RRR, no murmur, rub or gallop Lungs: Resp unlabored. Decreased bases, occassional exp wheeze. Abdomen: soft, nontender. +BS Extremities: Trace LE edema Dialysis Access:  L Hero +bruit/thrill  Dialysis Orders: MWF at [facility]  66.5 2K/2C 4hrs NO Heparin. L AV Hero 350/1.5  Hectorol 3mcg IV/HD Epogen 11000 Units IV/HD Venofer 100mg  qHd x10 (stop 2/20)   Assessment/Plan:  1. Pneumonia-? Edema given response; Now only on levaquin. .   2. Herpes Zoster- Valacyclovir per primary 2. COPD-Neb treatments. steriod taper. 3. ESRD - MWF @ [place], HD pending  K+4.4 No heparin. Needs lower EDW at discharge 4. Hypertension/volume - BP 138/66, amlodipine, coreg, hydralazine. No significant volume overload 5. Anemia - hgb 9.3 Continue ESA. Tsat 13 1/28 Venofer 100mg  qHD x10- cont, stops 2/20 6. Metabolic bone disease - CA+ 9.5 renvela with meals sensipar. hectorol- labs 1/28 phos 4.6, PTH 162 7. Nutrition - renal diet, nepro. multivitamin. Albumin 2.8 encourage protein 8. Seizure disorder- Keppra, per primary  Shelle Iron, NP [place] [phone] 02/21/2013,11:41 AM  LOS: 5 days   Renal Attending  Agree with note above with changes made. ACPowell, MD  Additional Objective Labs: Basic Metabolic Panel:  Recent Labs Lab 02/16/13 1945 02/17/13 0506 02/19/13 1344  NA 137 137 138  K 4.2 4.6 4.4  CL 92* 93* 95*  CO2 27 25 26   GLUCOSE 97 161* 104*  BUN 35* 41* 44*  CREATININE 8.30* 9.09* 7.54*  CALCIUM 10.7* 10.3 9.5  PHOS  --   --   3.3   Liver Function Tests:  Recent Labs Lab 02/19/13 1344  ALBUMIN 2.8*   No results found for this basename: LIPASE, AMYLASE,  in the last 168 hours CBC:  Recent Labs Lab 02/16/13 1945 02/17/13 0506 02/19/13 1344  WBC 5.1 3.2* 4.7  HGB 10.1* 10.2* 9.3*  HCT 30.7* 31.2* 29.4*  MCV 80.2 79.4 79.7  PLT 188 131* 181   Blood Culture    Component Value Date/Time   SDES BLOOD ARM RIGHT 06/27/2012 1835   SPECREQUEST BOTTLES DRAWN AEROBIC ONLY 4CC 06/27/2012 1835   CULT NO GROWTH 5 DAYS 06/27/2012 1835   REPTSTATUS 07/04/2012 FINAL 06/27/2012 1835    Cardiac Enzymes:  Recent Labs Lab 02/16/13 0002  TROPONINI <0.30   CBG: No results found for this basename: GLUCAP,  in the last 168 hours Iron Studies: No results found for this basename: IRON, TIBC, TRANSFERRIN, FERRITIN,  in the last 72 hours @lablastinr3 @ Studies/Results: Dg Chest Port 1 View  02/20/2013   CLINICAL DATA:  68 year old female with shortness of breath. Chronic obstructed pulmonary disease.  EXAM: PORTABLE CHEST - 1 VIEW  COMPARISON:  02/16/2013 and earlier.  FINDINGS: Portable AP semi upright view at 1803 hrs. Stable cardiomegaly and mediastinal contours. Significantly improved bibasilar ventilation with resolved patchy and indistinct opacity. No pneumothorax or pulmonary edema. No definite effusion. Visualized tracheal air column is within normal  limits.  IMPRESSION: Interval resolved patchy and indistinct right (versus bilateral) lung base opacity. Considering the prompt resolution this might have been related to edema rather than pneumonia.   Electronically Signed   By: Lars Pinks M.D.   On: 02/20/2013 18:43   Medications:   . amLODipine  10 mg Oral QHS  . aspirin EC  81 mg Oral Daily  . budesonide (PULMICORT) nebulizer solution  0.25 mg Nebulization BID  . carvedilol  25 mg Oral BID WC  . cinacalcet  60 mg Oral QHS  . darbepoetin  60 mcg Intravenous Q Mon-HD  . doxercalciferol  6 mcg Intravenous Q M,W,F-HD   . feeding supplement (NEPRO CARB STEADY)  237 mL Oral BID  . ferric gluconate (FERRLECIT/NULECIT) IV  125 mg Intravenous Q M,W,F-HD  . hydrALAZINE  100 mg Oral TID  . ipratropium-albuterol  3 mL Nebulization TID  . levETIRAcetam  500 mg Oral Q2000  . levofloxacin  500 mg Oral Q48H  . levothyroxine  88 mcg Oral QAC breakfast  . multivitamin  1 tablet Oral QHS  . pantoprazole  20 mg Oral Daily  . predniSONE  10 mg Oral Q breakfast  . sevelamer carbonate  3,200 mg Oral TID WC  . simvastatin  10 mg Oral q1800  . valACYclovir  500 mg Oral QHS

## 2013-02-21 NOTE — Progress Notes (Signed)
Pt a/o, no c/o pain, vss, pt stable 

## 2013-02-21 NOTE — Progress Notes (Signed)
D/C to go home with daughter per W/C VSS IV removed.

## 2013-02-21 NOTE — Discharge Summary (Addendum)
Physician Discharge Summary  [name] WUJ:[id number] DOB: 1945-11-20 DOA: 02/16/2013  PCP: [name], MD  Admit date: 02/16/2013 Discharge date: 02/21/2013  Time spent: 34 minutes  Recommendations for Outpatient Follow-up:  1. Follow up with Pulmonary in 1 week.  Discharge Diagnoses:  Principal Problem:   HAP (hospital-acquired pneumonia) Active Problems:   CAD   Chronic diastolic heart failure   ESRD   COPD exacerbation   Pneumonia   Discharge Condition: Stable  Diet recommendation: renal  Filed Weights   02/20/13 1220 02/20/13 1615 02/21/13 0454  Weight: 67.2 kg (148 lb 2.4 oz) 62.5 kg (137 lb 12.6 oz) 64.5 kg (142 lb 3.2 oz)    History of present illness:  68 y.o. female history of ESRD on hemodialysis on Monday Wednesday and Friday presents to the ER with complaints of increasing shortness of breath and wheezing with productive cough. Patient has been having shortness of breath the last one week. Shortness of breath was present even at rest. Denies any associated chest pain fever chills nausea vomiting diaphoresis. Patient was having nonproductive cough last one week. Since yesterday patient has been having some productive cough with wheezing. At this point patient decided to come to the ER. In the ER patient was found to be wheezing and was given IV Solu-Medrol and nebulizers.    Hospital Course:  HCAP (hospital-acquired pneumonia):  - vanc and cefepime started on 1.15.2015.  - Remained afebrile.  - Influenza PCR negative. De-escalate treatment to levaquin once afebrile. - cont for 2 additional days.  COPD exacerbation:  - no wheezing on exam; taper steroids, cont nebulizers  - follow up with pulmonary and critical care.  Chronic diastolic heart failure  - 2013 was 50-55% with 2-D echo showing severe pulmonary hypertension; clinically euvolemic   ESRD  - per renal. -SOB resolved.   Procedures:  CXR  Consultations:  renal  Discharge  Exam: Filed Vitals:   02/21/13 0958  BP: 138/66  Pulse:   Temp:   Resp:     General: A&O x3 Cardiovascular: RRR Respiratory: good air movement CTA B/L  Discharge Instructions      Discharge Orders   Future Appointments Provider Department Dept Phone   03/18/2013 8:30 AM [facility] [phone]   Please pick up your oral contrast prep at your scheduled location at least 1 day prior to your appointment. Liquids only 4 hours prior to your exam. Any medications can be taken as usual. Please arrive 15 min prior to your scheduled exam time.   03/18/2013 10:30 AM [facility] [place] [phone]   08/07/2013 10:00 AM Kathrynn Ducking, MD [facility] [phone]   Future Orders Complete By Expires   Diet - low sodium heart healthy  As directed    Increase activity slowly  As directed        Medication List         acetaminophen 500 MG tablet  Commonly known as:  TYLENOL  Take 500 mg by mouth every 6 (six) hours as needed for pain.     albuterol-ipratropium 18-103 MCG/ACT inhaler  Commonly known as:  COMBIVENT  Inhale 2 puffs into the lungs every 6 (six) hours as needed. For shortness of breath     amLODipine 10 MG tablet  Commonly known as:  NORVASC  Take 10 mg by mouth at bedtime.     aspirin EC 81 MG tablet  Take 81 mg by mouth daily.  carvedilol 25 MG tablet  Commonly known as:  COREG  Take 25 mg by mouth 2 (two) times daily with a meal.     cinacalcet 30 MG tablet  Commonly known as:  SENSIPAR  Take 60 mg by mouth at bedtime.     diphenhydrAMINE 25 MG tablet  Commonly known as:  BENADRYL  Take 75 mg by mouth daily as needed for itching (Takes prior to dialysis).     Fluticasone-Salmeterol 250-50 MCG/DOSE Aepb  Commonly known as:  ADVAIR DISKUS  Inhale 1 puff into the lungs 2 (two) times daily.     hydrALAZINE 50 MG tablet  Commonly known as:  APRESOLINE  Take 50  mg by mouth 3 (three) times daily.     hydrocortisone cream 1 %  Apply 1 application topically daily as needed for itching.     levETIRAcetam 500 MG tablet  Commonly known as:  KEPPRA  Take 500 mg by mouth daily at 8 pm.     levofloxacin 500 MG tablet  Commonly known as:  LEVAQUIN  Take 1 tablet (500 mg total) by mouth every other day.     levothyroxine 88 MCG tablet  Commonly known as:  SYNTHROID, LEVOTHROID  Take 88 mcg by mouth daily before breakfast.     MUCINEX FAST-MAX COLD & SINUS 10-650-400 MG/20ML Liqd  Generic drug:  Phenylephrine-APAP-Guaifenesin  Take 30 mLs by mouth daily as needed (for cold).     NITROSTAT 0.4 MG SL tablet  Generic drug:  nitroGLYCERIN  Place 0.4 mg under the tongue every 5 (five) minutes as needed. For chest pain.     pravastatin 20 MG tablet  Commonly known as:  PRAVACHOL  Take 20 mg by mouth daily.     sevelamer 800 MG tablet  Commonly known as:  RENAGEL  Take 1,600-3,200 mg by mouth See admin instructions. Take 4 tablets in the morning, at lunch, and 4 tablets dinner. Takes 1,600 mg with snacks twice a day.     traZODone 50 MG tablet  Commonly known as:  DESYREL  Take 50 mg by mouth at bedtime as needed for sleep.     valACYclovir 500 MG tablet  Commonly known as:  VALTREX  Take 1 tablet (500 mg total) by mouth at bedtime.       Allergies  Allergen Reactions  . Heparin Other (See Comments)    MDs told her not to take after reaction in ICU  . Iohexol Swelling and Other (See Comments)    1970s; passed out and had facial/tongue swelling.  Requires 13-hour prep with prednisone and benadryl  . Phenytoin Other (See Comments)    Had reaction while in ICU; doesn't know.  MDs told her not to take ever again.  . Wellbutrin [Bupropion] Other (See Comments)    seizures  . Ampicillin Hives and Rash  . Meperidine Hcl Swelling and Rash    Makes tongue swell  . Morphine Rash  . Penicillins Rash  . Valproic Acid And Related Other (See  Comments)    Confusion   . Ace Inhibitors Rash  . Pentazocine Lactate Other (See Comments)    Patient does not remember reaction to this med (Talwin).    Follow-up Information   Follow up with [facility] In 1 week. (hospital follow up)    Specialty:  Pulmonology   Contact information:   [place] [address] [phone]       The results of significant diagnostics from this hospitalization (  including imaging, microbiology, ancillary and laboratory) are listed below for reference.    Significant Diagnostic Studies: Dg Chest Port 1 View  02/20/2013   CLINICAL DATA:  68 year old female with shortness of breath. Chronic obstructed pulmonary disease.  EXAM: PORTABLE CHEST - 1 VIEW  COMPARISON:  02/16/2013 and earlier.  FINDINGS: Portable AP semi upright view at 1803 hrs. Stable cardiomegaly and mediastinal contours. Significantly improved bibasilar ventilation with resolved patchy and indistinct opacity. No pneumothorax or pulmonary edema. No definite effusion. Visualized tracheal air column is within normal limits.  IMPRESSION: Interval resolved patchy and indistinct right (versus bilateral) lung base opacity. Considering the prompt resolution this might have been related to edema rather than pneumonia.   Electronically Signed   By: Lars Pinks M.D.   On: 02/20/2013 18:43   Dg Chest Port 1 View  02/16/2013   CLINICAL DATA:  Shortness of breath  EXAM: PORTABLE CHEST - 1 VIEW  COMPARISON:  DG CHEST 2 VIEW dated 09/23/2012  FINDINGS: Low lung volumes. Increased density projects within the right lung base. The cardiac silhouette is enlarged. Atherosclerotic calcifications appreciated within the aorta. The osseous structures unremarkable.  IMPRESSION: Right lower lobe infiltrate. Stable cardiomegaly with technique taken into consideration.   Electronically Signed   By: Margaree Mackintosh M.D.   On: 02/16/2013 19:56    Microbiology: No results found for this or any previous  visit (from the past 240 hour(s)).   Labs: Basic Metabolic Panel:  Recent Labs Lab 02/16/13 1945 02/17/13 0506 02/19/13 1344  NA 137 137 138  K 4.2 4.6 4.4  CL 92* 93* 95*  CO2 27 25 26   GLUCOSE 97 161* 104*  BUN 35* 41* 44*  CREATININE 8.30* 9.09* 7.54*  CALCIUM 10.7* 10.3 9.5  PHOS  --   --  3.3   Liver Function Tests:  Recent Labs Lab 02/19/13 1344  ALBUMIN 2.8*   No results found for this basename: LIPASE, AMYLASE,  in the last 168 hours No results found for this basename: AMMONIA,  in the last 168 hours CBC:  Recent Labs Lab 02/16/13 1945 02/17/13 0506 02/19/13 1344  WBC 5.1 3.2* 4.7  HGB 10.1* 10.2* 9.3*  HCT 30.7* 31.2* 29.4*  MCV 80.2 79.4 79.7  PLT 188 131* 181   Cardiac Enzymes:  Recent Labs Lab 02/16/13 0002  TROPONINI <0.30   BNP: BNP (last 3 results)  Recent Labs  09/23/12 0450 02/16/13 1945  PROBNP 10221.0* 31758.0*   CBG: No results found for this basename: GLUCAP,  in the last 168 hours     Signed:  Charlynne Cousins  [facility] 02/21/2013, 12:32 PM

## 2013-02-21 NOTE — Progress Notes (Signed)
I reviewed CDC policy regarding H. Zoster and spoke to infection control nurse.  The CDC policy does not require airborne precautions.  I was advised to speak with one of the ID doctors for approval and spoke with Sherri Sear, MD who approved the contact precautions and covering of the lesion. Kris No C

## 2013-03-01 DIAGNOSIS — N186 End stage renal disease: Secondary | ICD-10-CM | POA: Diagnosis not present

## 2013-03-03 DIAGNOSIS — N2581 Secondary hyperparathyroidism of renal origin: Secondary | ICD-10-CM | POA: Diagnosis not present

## 2013-03-03 DIAGNOSIS — D631 Anemia in chronic kidney disease: Secondary | ICD-10-CM | POA: Diagnosis not present

## 2013-03-03 DIAGNOSIS — N186 End stage renal disease: Secondary | ICD-10-CM | POA: Diagnosis not present

## 2013-03-03 DIAGNOSIS — D509 Iron deficiency anemia, unspecified: Secondary | ICD-10-CM | POA: Diagnosis not present

## 2013-03-06 ENCOUNTER — Ambulatory Visit (INDEPENDENT_AMBULATORY_CARE_PROVIDER_SITE_OTHER): Payer: Medicare Other | Admitting: Pulmonary Disease

## 2013-03-06 ENCOUNTER — Encounter: Payer: Self-pay | Admitting: Pulmonary Disease

## 2013-03-06 VITALS — BP 134/74 | HR 66 | Ht 62.5 in | Wt 141.0 lb

## 2013-03-06 DIAGNOSIS — F17200 Nicotine dependence, unspecified, uncomplicated: Secondary | ICD-10-CM | POA: Diagnosis not present

## 2013-03-06 DIAGNOSIS — I2510 Atherosclerotic heart disease of native coronary artery without angina pectoris: Secondary | ICD-10-CM

## 2013-03-06 DIAGNOSIS — I5032 Chronic diastolic (congestive) heart failure: Secondary | ICD-10-CM

## 2013-03-06 DIAGNOSIS — J449 Chronic obstructive pulmonary disease, unspecified: Secondary | ICD-10-CM | POA: Diagnosis not present

## 2013-03-06 DIAGNOSIS — J441 Chronic obstructive pulmonary disease with (acute) exacerbation: Secondary | ICD-10-CM | POA: Diagnosis not present

## 2013-03-06 MED ORDER — BREO ELLIPTA 100-25 MCG/INH IN AEPB: 1.0000 | INHALATION_SPRAY | Freq: Every day | RESPIRATORY_TRACT | Status: DC

## 2013-03-06 NOTE — Progress Notes (Signed)
Subjective:    Patient ID: [name], female    DOB: 12/08/1945, 68 y.o.   MRN: [id number]  HPI  [name] was recently hosptialzied for a COPD exacerbation and HCAP for 5 days and was discharged with plans to see [place] on follow up.    She stated that for the first two to three days of her illness she had a lot of cdyspnea, chest congestion but no chest pain.  She was treated with antibiotics and steroid and had fluid removal in the hospital (HD x3 days).    Since hospital discharge she has been OK.  She has used oxygen once with dialysis for dyspnea.  She had some dyspnea initially which is improving.    She has been told that she has COPD which she uses Advair prn and combivent PRN.  She was diagnosed with COPD many years ago by a primary care physician.    In 09/2012 she was hospitalized for an AE COPD.    She doesn't cough every day. She continues to smoke 1/4 pack of cigarettes a day.  She used to smoke 1 pack per day for many years, she started at age 82.  She thinks she smoked 1ppd for 30 years.     Past Medical History  Diagnosis Date  . CAD (coronary artery disease)     stent to RCA  . CVA (cerebral infarction) 2003    no apparent residual  . Hypothyroidism   . PVD (peripheral vascular disease)   . Hyperlipidemia   . Positive PPD     completed rifampin  . Diastolic congestive heart failure   . Hypertension   . COPD (chronic obstructive pulmonary disease) t  . Cancer     clear cell cancer, kidney  . Complication of anesthesia 12/2010    pt is very confused, with AMS with anesthesia  . CVA 07/27/2008    CVA affected cognition and memory per family, no focal deficits.    . ESRD 07/27/2008    ESRD due to HTN and NSAID's, started hemodialysis in 2005 in [place], [place]. Went to [facility] from 2010 to 2012 and since 2012 has been getting dialysis at [facility] on [address] in [place] on a MWF schedule. First access with RUA AVG placed in [place]. Next and  current access was LUA AVG placed by Dr. Lucky Cowboy in [place] in or around 2012. Has had 2 or 3 procedures on that graft since placed per family. She gets her access work done here in [place] now. She is allergic to heparin and does not get any heparin at dialysis; she had an allergic reaction apparently when in ICU in the past.      . Encephalopathy   . Seizures   . Dyslipidemia   . Chronic renal insufficiency     On hemodialysis  . Arthritis of shoulder     Bilateral  . Depression      Family History  Problem Relation Age of Onset  . Heart disease Father   . Hypertension Mother   . Dementia Mother   . Coronary artery disease Sister   . Heart attack Sister   . Hypertension Brother      History   Social History  . Marital Status: Legally Separated    Spouse Name: N/A    Number of Children: 2  . Years of Education: 16   Occupational History  . Retired Marine scientist   .     Social History Main Topics  .  Smoking status: Current Every Day Smoker -- 1.00 packs/day for 53 years    Types: Cigarettes  . Smokeless tobacco: Never Used     Comment: 1/2 ppd since getting out of hospital  . Alcohol Use: Yes     Comment: very rarely per pt  . Drug Use: No     Comment: former marijuana use, several years  . Sexual Activity: Not Currently    Birth Control/ Protection: Post-menopausal   Other Topics Concern  . Not on file   Social History Narrative  . No narrative on file     Allergies  Allergen Reactions  . Heparin Other (See Comments)    MDs told her not to take after reaction in ICU  . Iohexol Swelling and Other (See Comments)    1970s; passed out and had facial/tongue swelling.  Requires 13-hour prep with prednisone and benadryl  . Phenytoin Other (See Comments)    Had reaction while in ICU; doesn't know.  MDs told her not to take ever again.  . Wellbutrin [Bupropion] Other (See Comments)    seizures  . Ampicillin Hives and Rash  . Meperidine Hcl Swelling and Rash    Makes  tongue swell  . Morphine Rash  . Penicillins Rash  . Valproic Acid And Related Other (See Comments)    Confusion   . Ace Inhibitors Rash  . Pentazocine Lactate Other (See Comments)    Patient does not remember reaction to this med (Talwin).      Outpatient Prescriptions Prior to Visit  Medication Sig Dispense Refill  . acetaminophen (TYLENOL) 500 MG tablet Take 500 mg by mouth every 6 (six) hours as needed for pain.      [name] albuterol-ipratropium (COMBIVENT) 18-103 MCG/ACT inhaler Inhale 2 puffs into the lungs every 6 (six) hours as needed. For shortness of breath      . amLODipine (NORVASC) 10 MG tablet Take 10 mg by mouth at bedtime.       [name] aspirin EC 81 MG tablet Take 81 mg by mouth daily.      . carvedilol (COREG) 25 MG tablet Take 25 mg by mouth 2 (two) times daily with a meal.      . cinacalcet (SENSIPAR) 30 MG tablet Take 60 mg by mouth at bedtime.       . diphenhydrAMINE (BENADRYL) 25 MG tablet Take 75 mg by mouth daily as needed for itching (Takes prior to dialysis).      . hydrALAZINE (APRESOLINE) 50 MG tablet Take 50 mg by mouth 3 (three) times daily.      . hydrocortisone cream 1 % Apply 1 application topically daily as needed for itching.      . levETIRAcetam (KEPPRA) 500 MG tablet Take 500 mg by mouth daily at 8 pm.      . levothyroxine (SYNTHROID, LEVOTHROID) 88 MCG tablet Take 88 mcg by mouth daily before breakfast.      . NITROSTAT 0.4 MG SL tablet Place 0.4 mg under the tongue every 5 (five) minutes as needed. For chest pain.      [name] Phenylephrine-APAP-Guaifenesin (MUCINEX FAST-MAX COLD & SINUS) 10-650-400 MG/20ML LIQD Take 30 mLs by mouth daily as needed (for cold).      . pravastatin (PRAVACHOL) 20 MG tablet Take 20 mg by mouth daily.      . sevelamer (RENAGEL) 800 MG tablet Take 1,600-3,200 mg by mouth See admin instructions. Take 4 tablets in the morning, at lunch, and 4 tablets dinner. Takes 1,600 mg with  snacks twice a day.      . traZODone (DESYREL) 50 MG tablet  Take 50 mg by mouth at bedtime as needed for sleep.       . valACYclovir (VALTREX) 500 MG tablet Take 1 tablet (500 mg total) by mouth at bedtime.  7 tablet  0  . Fluticasone-Salmeterol (ADVAIR DISKUS) 250-50 MCG/DOSE AEPB Inhale 1 puff into the lungs 2 (two) times daily.  60 each  1  . levofloxacin (LEVAQUIN) 500 MG tablet Take 1 tablet (500 mg total) by mouth every other day.  2 tablet  0   No facility-administered medications prior to visit.      Review of Systems  Constitutional: Negative for fever and unexpected weight change.  HENT: Negative for congestion, dental problem, ear pain, nosebleeds, postnasal drip, rhinorrhea, sinus pressure, sneezing, sore throat and trouble swallowing.   Eyes: Negative for redness and itching.  Respiratory: Positive for cough and shortness of breath. Negative for chest tightness and wheezing.   Cardiovascular: Negative for palpitations and leg swelling.  Gastrointestinal: Negative for nausea and vomiting.  Genitourinary: Negative for dysuria.  Musculoskeletal: Negative for joint swelling.  Skin: Negative for rash.  Neurological: Negative for headaches.  Hematological: Does not bruise/bleed easily.  Psychiatric/Behavioral: Negative for dysphoric mood. The patient is not nervous/anxious.        Objective:   Physical Exam Filed Vitals:   03/06/13 1011  BP: 134/74  Pulse: 66  Height: 5' 2.5" (1.588 m)  Weight: 141 lb (63.957 kg)  SpO2: 100%  RA  Gen: well appearing, no acute distress HEENT: NCAT, PERRL, EOMi, OP clear, neck supple without masses PULM: CTA B CV: Regular with occassional interrupted beats, slight murmur llsb, elevated JVP AB: BS+, soft, nontender, no hsm Ext: warm, trace edema, no clubbing, no cyanosis Derm: no rash or skin breakdown Neuro: A&Ox4, CN II-XII intact, strength 5/5 in all 4 extremities      Assessment & Plan:   COPD exacerbation [name] has been hospitalized twice now for COPD exacerbations in the last  year.  I don't think that the 02/2013 hospitalization was actually for HCAP as her dense infiltrate cleared with dialysis and was not associated with a fever or high WBC count.  So this was much more likely to be an AE COPD with a CHF exacerbation.  Exacerbations of COPD causing hospitalization are a marker of very severe COPD.  So we really need to work to prevent these.  The best thing she can do to try to quit smoking. In addition, she needs to use her inhalers as ordered rather than as needed.  Because of the cost of Advair, she doesn't often fill this Rx.  Plan: -switch to [facility] daily for ease of use -stop smoking -full PFT  Chronic diastolic heart failure This is associated with severe pulmonary hypertension (as suggested on echo in 2013).  Based on her physical exam today, I believe she has right heart failure.  The likelihood that this would benefit from a pulmonary vasodilator is low given her COPD and CHF.  Plan: -continue fluid monitoring and management with HD -consider repeat echo  TOBACCO USER This is really her primary problem as it is contributing to her heart failure and COPD.  She is willing to quit, but has struggled.  Chantix lead to a hospitalization in [place] several years ago. She is willing to try nicotine patches. We discussed the imprortance of quitting at length today   Updated Medication List Outpatient Encounter Prescriptions as  of 03/06/2013  Medication Sig  . acetaminophen (TYLENOL) 500 MG tablet Take 500 mg by mouth every 6 (six) hours as needed for pain.  [name] albuterol-ipratropium (COMBIVENT) 18-103 MCG/ACT inhaler Inhale 2 puffs into the lungs every 6 (six) hours as needed. For shortness of breath  . amLODipine (NORVASC) 10 MG tablet Take 10 mg by mouth at bedtime.   [name] aspirin EC 81 MG tablet Take 81 mg by mouth daily.  . carvedilol (COREG) 25 MG tablet Take 25 mg by mouth 2 (two) times daily with a meal.  . cinacalcet (SENSIPAR) 30 MG tablet Take 60 mg by  mouth at bedtime.   . diphenhydrAMINE (BENADRYL) 25 MG tablet Take 75 mg by mouth daily as needed for itching (Takes prior to dialysis).  . hydrALAZINE (APRESOLINE) 50 MG tablet Take 50 mg by mouth 3 (three) times daily.  . hydrocortisone cream 1 % Apply 1 application topically daily as needed for itching.  . levETIRAcetam (KEPPRA) 500 MG tablet Take 500 mg by mouth daily at 8 pm.  . levothyroxine (SYNTHROID, LEVOTHROID) 88 MCG tablet Take 88 mcg by mouth daily before breakfast.  . NITROSTAT 0.4 MG SL tablet Place 0.4 mg under the tongue every 5 (five) minutes as needed. For chest pain.  [name] Phenylephrine-APAP-Guaifenesin (MUCINEX FAST-MAX COLD & SINUS) 10-650-400 MG/20ML LIQD Take 30 mLs by mouth daily as needed (for cold).  . pravastatin (PRAVACHOL) 20 MG tablet Take 20 mg by mouth daily.  . sevelamer (RENAGEL) 800 MG tablet Take 1,600-3,200 mg by mouth See admin instructions. Take 4 tablets in the morning, at lunch, and 4 tablets dinner. Takes 1,600 mg with snacks twice a day.  . traZODone (DESYREL) 50 MG tablet Take 50 mg by mouth at bedtime as needed for sleep.   . valACYclovir (VALTREX) 500 MG tablet Take 1 tablet (500 mg total) by mouth at bedtime.  . [DISCONTINUED] Fluticasone-Salmeterol (ADVAIR DISKUS) 250-50 MCG/DOSE AEPB Inhale 1 puff into the lungs 2 (two) times daily.  . [DISCONTINUED] Fluticasone-Salmeterol (ADVAIR) 250-50 MCG/DOSE AEPB Inhale 1 puff into the lungs 2 (two) times daily. Pt uses prn  . BREO ELLIPTA 100-25 MCG/INH AEPB Inhale 1 puff into the lungs daily.  . [DISCONTINUED] levofloxacin (LEVAQUIN) 500 MG tablet Take 1 tablet (500 mg total) by mouth every other day.

## 2013-03-06 NOTE — Assessment & Plan Note (Signed)
This is really her primary problem as it is contributing to her heart failure and COPD.  She is willing to quit, but has struggled.  Chantix lead to a hospitalization in [place] several years ago. She is willing to try nicotine patches. We discussed the imprortance of quitting at length today

## 2013-03-06 NOTE — Assessment & Plan Note (Signed)
[name] has been hospitalized twice now for COPD exacerbations in the last year.  I don't think that the 02/2013 hospitalization was actually for HCAP as her dense infiltrate cleared with dialysis and was not associated with a fever or high WBC count.  So this was much more likely to be an AE COPD with a CHF exacerbation.  Exacerbations of COPD causing hospitalization are a marker of very severe COPD.  So we really need to work to prevent these.  The best thing she can do to try to quit smoking. In addition, she needs to use her inhalers as ordered rather than as needed.  Because of the cost of Advair, she doesn't often fill this Rx.  Plan: -switch to [facility] daily for ease of use -stop smoking -full PFT

## 2013-03-06 NOTE — Assessment & Plan Note (Signed)
This is associated with severe pulmonary hypertension (as suggested on echo in 2013).  Based on her physical exam today, I believe she has right heart failure.  The likelihood that this would benefit from a pulmonary vasodilator is low given her COPD and CHF.  Plan: -continue fluid monitoring and management with HD -consider repeat echo

## 2013-03-06 NOTE — Patient Instructions (Signed)
Stop Advair Use Breo once per day We will arrange PFTs and see you back after they have been completed

## 2013-03-18 ENCOUNTER — Encounter (HOSPITAL_COMMUNITY): Payer: Self-pay

## 2013-03-18 ENCOUNTER — Ambulatory Visit
Admission: RE | Admit: 2013-03-18 | Discharge: 2013-03-18 | Disposition: A | Discharge status: Home / Self Care | Payer: Medicare Other | Source: Ambulatory Visit | Attending: Interventional Radiology | Admitting: Interventional Radiology

## 2013-03-18 ENCOUNTER — Telehealth: Payer: Self-pay | Admitting: Pulmonary Disease

## 2013-03-18 ENCOUNTER — Ambulatory Visit (HOSPITAL_COMMUNITY)
Admission: RE | Admit: 2013-03-18 | Discharge: 2013-03-18 | Disposition: A | Discharge status: Home / Self Care | Payer: Medicare Other | Source: Ambulatory Visit | Attending: Interventional Radiology | Admitting: Interventional Radiology

## 2013-03-18 DIAGNOSIS — N2889 Other specified disorders of kidney and ureter: Secondary | ICD-10-CM

## 2013-03-18 DIAGNOSIS — N281 Cyst of kidney, acquired: Secondary | ICD-10-CM | POA: Diagnosis not present

## 2013-03-18 DIAGNOSIS — L299 Pruritus, unspecified: Secondary | ICD-10-CM | POA: Diagnosis not present

## 2013-03-18 DIAGNOSIS — N289 Disorder of kidney and ureter, unspecified: Secondary | ICD-10-CM | POA: Diagnosis not present

## 2013-03-18 DIAGNOSIS — D4959 Neoplasm of unspecified behavior of other genitourinary organ: Secondary | ICD-10-CM | POA: Diagnosis not present

## 2013-03-18 DIAGNOSIS — N269 Renal sclerosis, unspecified: Secondary | ICD-10-CM | POA: Diagnosis not present

## 2013-03-18 DIAGNOSIS — L259 Unspecified contact dermatitis, unspecified cause: Secondary | ICD-10-CM | POA: Diagnosis not present

## 2013-03-18 MED ORDER — IOHEXOL 300 MG/ML  SOLN
100.0000 mL | Freq: Once | INTRAMUSCULAR | Status: AC | PRN
  Administered 2013-03-18: 100 mL via INTRAVENOUS

## 2013-03-18 NOTE — Telephone Encounter (Signed)
Samples have been given to the pt. Nothing further was needed. 

## 2013-04-01 DIAGNOSIS — N186 End stage renal disease: Secondary | ICD-10-CM | POA: Diagnosis not present

## 2013-04-02 DIAGNOSIS — E119 Type 2 diabetes mellitus without complications: Secondary | ICD-10-CM | POA: Diagnosis not present

## 2013-04-02 DIAGNOSIS — D509 Iron deficiency anemia, unspecified: Secondary | ICD-10-CM | POA: Diagnosis not present

## 2013-04-02 DIAGNOSIS — N2581 Secondary hyperparathyroidism of renal origin: Secondary | ICD-10-CM | POA: Diagnosis not present

## 2013-04-02 DIAGNOSIS — N186 End stage renal disease: Secondary | ICD-10-CM | POA: Diagnosis not present

## 2013-04-02 DIAGNOSIS — D631 Anemia in chronic kidney disease: Secondary | ICD-10-CM | POA: Diagnosis not present

## 2013-04-08 ENCOUNTER — Ambulatory Visit (INDEPENDENT_AMBULATORY_CARE_PROVIDER_SITE_OTHER): Payer: Medicare Other | Admitting: Pulmonary Disease

## 2013-04-08 DIAGNOSIS — J449 Chronic obstructive pulmonary disease, unspecified: Secondary | ICD-10-CM

## 2013-04-08 NOTE — Progress Notes (Signed)
PFt done. Cumby Bing, CMA

## 2013-04-15 ENCOUNTER — Encounter: Payer: Self-pay | Admitting: Pulmonary Disease

## 2013-04-17 ENCOUNTER — Telehealth: Payer: Self-pay | Admitting: Pulmonary Disease

## 2013-04-17 NOTE — Progress Notes (Signed)
Quick Note:  Called and spoke to pt. She stated she was returning BQ's call regarding PFT results. Pt verbalized understanding of the results and confirmed upcoming apt on 04/29/2013. Pt denied any further questions or concerns at this time. ______

## 2013-04-17 NOTE — Telephone Encounter (Signed)
Called and spoke to pt. She stated she was returning BQ's call regarding PFT results. Pt verbalized understanding of the results and confirmed upcoming apt on 04/29/2013. Pt denied any further questions or concerns at this time.

## 2013-04-23 DIAGNOSIS — E1129 Type 2 diabetes mellitus with other diabetic kidney complication: Secondary | ICD-10-CM | POA: Diagnosis not present

## 2013-04-24 DIAGNOSIS — L290 Pruritus ani: Secondary | ICD-10-CM | POA: Diagnosis not present

## 2013-04-24 DIAGNOSIS — D485 Neoplasm of uncertain behavior of skin: Secondary | ICD-10-CM | POA: Diagnosis not present

## 2013-04-24 DIAGNOSIS — D2330 Other benign neoplasm of skin of unspecified part of face: Secondary | ICD-10-CM | POA: Diagnosis not present

## 2013-04-24 IMAGING — CR DG CHEST 2V
1 series · 1 of 1 positions shown · non-contrast
Comparison: 04/29/2011

CLINICAL DATA: 66-year-old female with dizziness and weakness.

CHEST - 2 VIEW

[view not recorded]
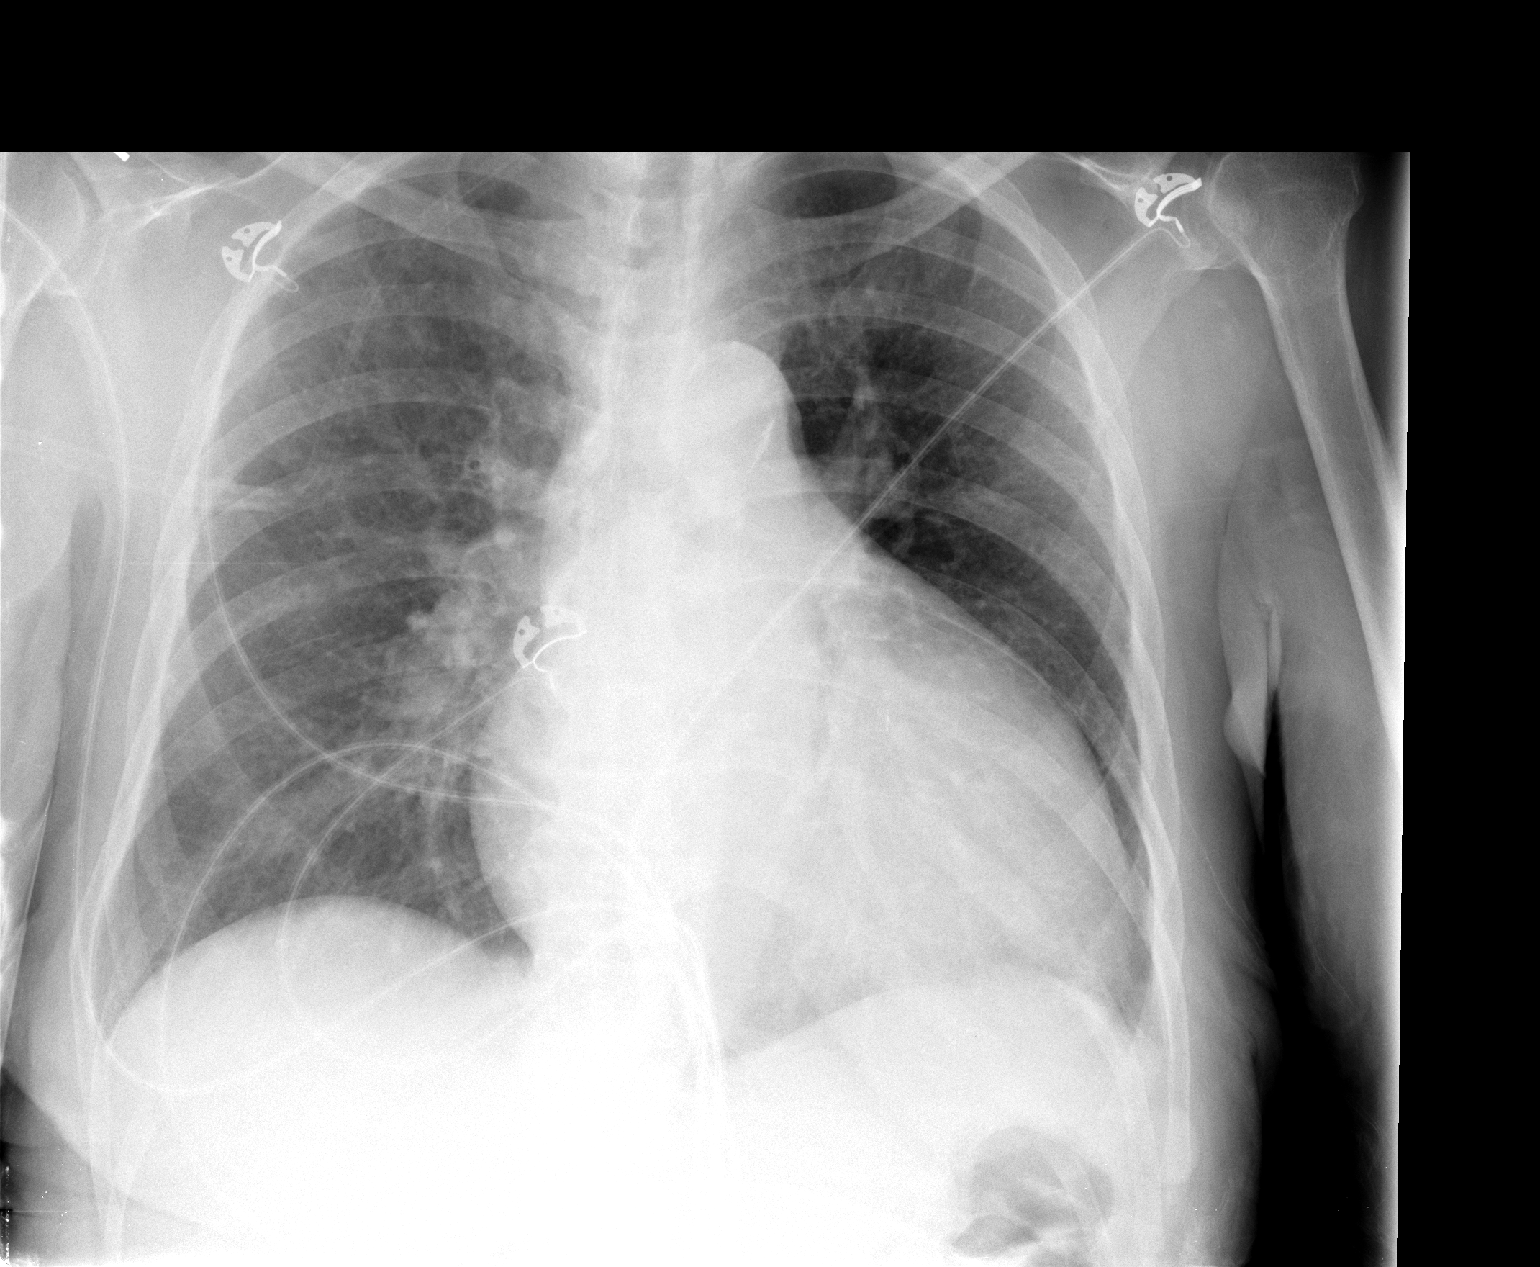

[1 of 1 positions shown; findings below may reference images not displayed]

FINDINGS: Enlargement of the cardiopericardial silhouette is again
noted.
Mild pulmonary vascular congestion is present.
There is no evidence of focal airspace disease, pulmonary edema,
suspicious pulmonary nodule/mass, pleural effusion, or
pneumothorax.
No acute bony abnormalities are identified.
IMPRESSION: Enlargement of the cardiopericardial silhouette with mild pulmonary
vascular congestion.

## 2013-04-29 ENCOUNTER — Ambulatory Visit (INDEPENDENT_AMBULATORY_CARE_PROVIDER_SITE_OTHER): Payer: Medicare Other | Admitting: Pulmonary Disease

## 2013-04-29 ENCOUNTER — Encounter: Payer: Self-pay | Admitting: Pulmonary Disease

## 2013-04-29 VITALS — BP 150/80 | HR 74 | Ht 62.5 in | Wt 145.0 lb

## 2013-04-29 DIAGNOSIS — R0602 Shortness of breath: Secondary | ICD-10-CM | POA: Diagnosis not present

## 2013-04-29 DIAGNOSIS — I5032 Chronic diastolic (congestive) heart failure: Secondary | ICD-10-CM

## 2013-04-29 DIAGNOSIS — Z720 Tobacco use: Secondary | ICD-10-CM

## 2013-04-29 DIAGNOSIS — I2510 Atherosclerotic heart disease of native coronary artery without angina pectoris: Secondary | ICD-10-CM

## 2013-04-29 DIAGNOSIS — F17200 Nicotine dependence, unspecified, uncomplicated: Secondary | ICD-10-CM

## 2013-04-29 DIAGNOSIS — R942 Abnormal results of pulmonary function studies: Secondary | ICD-10-CM | POA: Diagnosis not present

## 2013-04-29 DIAGNOSIS — J441 Chronic obstructive pulmonary disease with (acute) exacerbation: Secondary | ICD-10-CM

## 2013-04-29 NOTE — Assessment & Plan Note (Signed)
Encouraged at length to quit, she stated she doesn't want to

## 2013-04-29 NOTE — Assessment & Plan Note (Signed)
See above

## 2013-04-29 NOTE — Assessment & Plan Note (Addendum)
This lady has smoked her entire adult life but somehow doesn't have COPD. However, her PFT result showed a decreased DLCO which either represents interstitial lung disease or pulmonary hypertension.  I think pulmonary hypertension is more likely. Despite a likely diagnosis of pulmonary hypertension, she continues to do well from a functional standpoint We discussed the fact that right heart failure (due to pulmonary hypertension) progresses.  If it turns out that she has pulmonary arterial hypsertension (WHO class I) then our treatment options would be very limited considering her ESRD status.  She has sleep apnea but doesn't want to use CPAP.  At this point her best options for treatment would be to use CPAP and to quit smoking.  She really doesn't want to do either right now.  Plan: -Stop Breo -use combivent prn dyspnea -Check CT chest and Echocardiogram to assess for ILD and pulmonary hypertension -Request records from Dr. Governor Specking in [place] for heart catheterization results -monitor her functional status with q6 month 6MW -encourage to quit smoking -encourage to use CPAP

## 2013-04-29 NOTE — Patient Instructions (Signed)
We will arrange a 6 minute walk, CT chest, and Echocardiogam Quit smoking Think about using CPAP We will see you back in 6 months

## 2013-04-29 NOTE — Progress Notes (Signed)
Subjective:    Patient ID: [name], female    DOB: 1945/11/12, 68 y.o.   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  PFTs normal.  Active smoker. Secondary Pulmonary hypertension, ESRD, CHF 04/08/2013 full pulmonary function test> ratio 77%, FEV1 1.44 L (80% predicted), total lung capacity 3.55 (72% predicted), ERV 0.23 L (129% predicted), DLCO 10.95 (47% predicted)  HPI  04/29/2013 ROV >> [name] is doing well, no breathing trouble.  Using Kidron daily, still smokes 1/2 PPD. Previously saw Dr. Sunday Shams in [place], > multiple heart catheterizations. She currently doesn't feel dyspnic with exertion, only limitation is from knees.  She is not coughing. She continues to walk 1/2 mile a day without dyspnea, though she says "she don't like it" because it makes her knees hurt.   Past Medical History  Diagnosis Date  . CAD (coronary artery disease)     stent to RCA  . CVA (cerebral infarction) 2003    no apparent residual  . Hypothyroidism   . PVD (peripheral vascular disease)   . Hyperlipidemia   . Positive PPD     completed rifampin  . Diastolic congestive heart failure   . Hypertension   . COPD (chronic obstructive pulmonary disease) t  . Cancer     clear cell cancer, kidney  . Complication of anesthesia 12/2010    pt is very confused, with AMS with anesthesia  . CVA 07/27/2008    CVA affected cognition and memory per family, no focal deficits.    . ESRD 07/27/2008    ESRD due to HTN and NSAID's, started hemodialysis in 2005 in [place], [place]. Went to [facility] from 2010 to 2012 and since 2012 has been getting dialysis at [facility] on [address] [place] on a MWF schedule. First access with RUA AVG placed in [place]. Next and current access was LUA AVG placed by Dr. Lucky Cowboy in [place] in or around 2012. Has had 2 or 3 procedures on that graft since placed per family. She gets her access work done here in [place]  now. She is allergic to heparin and does not get any heparin at dialysis; she had an allergic reaction apparently when in ICU in the past.      . Encephalopathy   . Seizures   . Dyslipidemia   . Chronic renal insufficiency     On hemodialysis  . Arthritis of shoulder     Bilateral  . Depression      Review of Systems  Constitutional: Negative for fever, chills and fatigue.  HENT: Negative for rhinorrhea, sinus pressure and sneezing.   Respiratory: Negative for cough, shortness of breath and wheezing.   Cardiovascular: Negative for chest pain, palpitations and leg swelling.       Objective:   Physical Exam  Filed Vitals:   04/29/13 0912  BP: 150/80  Pulse: 74  Height: 5' 2.5" (1.588 m)  Weight: 145 lb (65.772 kg)  SpO2: 97%   RA  Gen: well appearing HEENT: NCAT, OP clear PULM: CTA B CV: RRR, systolic murmr I/VI RUSB, JVD noted AB: BS+, soft Ext: warm, no edema, clubbing noted Neuro: A&Ox4, maew     Assessment & Plan:   Dyspnea This lady has smoked her entire adult life but somehow doesn't have COPD. However, her PFT result showed a decreased DLCO which either represents interstitial lung disease or pulmonary hypertension.  I think pulmonary hypertension is more likely. Despite a likely diagnosis  of pulmonary hypertension, she continues to do well from a functional standpoint We discussed the fact that right heart failure (due to pulmonary hypertension) progresses.  If it turns out that she has pulmonary arterial hypsertension (WHO class I) then our treatment options would be very limited considering her ESRD status.  She has sleep apnea but doesn't want to use CPAP.  At this point her best options for treatment would be to use CPAP and to quit smoking.  She really doesn't want to do either right now.  Plan: -Stop Breo -use combivent prn dyspnea -Check CT chest and Echocardiogram to assess for ILD and pulmonary hypertension -Request records from Dr. Governor Specking in  [place] for heart catheterization results -monitor her functional status with q6 month 6MW -encourage to quit smoking -encourage to use CPAP  Chronic diastolic heart failure See above  TOBACCO USER Encouraged at length to quit, she stated she doesn't want to    Updated Medication List Outpatient Encounter Prescriptions as of 04/29/2013  Medication Sig  . acetaminophen (TYLENOL) 500 MG tablet Take 500 mg by mouth every 6 (six) hours as needed for pain.  [name] amLODipine (NORVASC) 10 MG tablet Take 10 mg by mouth at bedtime.   [name] aspirin EC 81 MG tablet Take 81 mg by mouth daily.  [name] BREO ELLIPTA 100-25 MCG/INH AEPB Inhale 1 puff into the lungs daily.  . carvedilol (COREG) 25 MG tablet Take 25 mg by mouth 2 (two) times daily with a meal.  . cinacalcet (SENSIPAR) 30 MG tablet Take 60 mg by mouth at bedtime.   . hydrALAZINE (APRESOLINE) 50 MG tablet Take 50 mg by mouth 3 (three) times daily.  . hydrocortisone cream 1 % Apply 1 application topically daily as needed for itching.  . levETIRAcetam (KEPPRA) 500 MG tablet Take 500 mg by mouth daily at 8 pm.  . levothyroxine (SYNTHROID, LEVOTHROID) 88 MCG tablet Take 88 mcg by mouth daily before breakfast.  . NITROSTAT 0.4 MG SL tablet Place 0.4 mg under the tongue every 5 (five) minutes as needed. For chest pain.  . pravastatin (PRAVACHOL) 20 MG tablet Take 20 mg by mouth daily.  . sevelamer carbonate (RENVELA) 800 MG tablet Take 800 mg by mouth. 4 tabs TID, 2 tabs with each snack  . traZODone (DESYREL) 50 MG tablet Take 50 mg by mouth at bedtime as needed for sleep.   . valACYclovir (VALTREX) 500 MG tablet Take 1 tablet (500 mg total) by mouth at bedtime.  [name] Phenylephrine-APAP-Guaifenesin (MUCINEX FAST-MAX COLD & SINUS) 10-650-400 MG/20ML LIQD Take 30 mLs by mouth daily as needed (for cold).  . [DISCONTINUED] albuterol-ipratropium (COMBIVENT) 18-103 MCG/ACT inhaler Inhale 2 puffs into the lungs every 6 (six) hours as needed. For shortness of  breath  . [DISCONTINUED] diphenhydrAMINE (BENADRYL) 25 MG tablet Take 75 mg by mouth daily as needed for itching (Takes prior to dialysis).  . [DISCONTINUED] sevelamer (RENAGEL) 800 MG tablet Take 1,600-3,200 mg by mouth See admin instructions. Take 4 tablets in the morning, at lunch, and 4 tablets dinner. Takes 1,600 mg with snacks twice a day.

## 2013-05-01 DIAGNOSIS — N186 End stage renal disease: Secondary | ICD-10-CM | POA: Diagnosis not present

## 2013-05-02 DIAGNOSIS — D631 Anemia in chronic kidney disease: Secondary | ICD-10-CM | POA: Diagnosis not present

## 2013-05-02 DIAGNOSIS — D509 Iron deficiency anemia, unspecified: Secondary | ICD-10-CM | POA: Diagnosis not present

## 2013-05-02 DIAGNOSIS — N186 End stage renal disease: Secondary | ICD-10-CM | POA: Diagnosis not present

## 2013-05-02 DIAGNOSIS — N2581 Secondary hyperparathyroidism of renal origin: Secondary | ICD-10-CM | POA: Diagnosis not present

## 2013-05-02 DIAGNOSIS — E119 Type 2 diabetes mellitus without complications: Secondary | ICD-10-CM | POA: Diagnosis not present

## 2013-05-05 ENCOUNTER — Ambulatory Visit: Payer: Medicare Other

## 2013-05-07 ENCOUNTER — Other Ambulatory Visit (HOSPITAL_COMMUNITY): Payer: Self-pay | Admitting: Pulmonary Disease

## 2013-05-07 DIAGNOSIS — N189 Chronic kidney disease, unspecified: Principal | ICD-10-CM

## 2013-05-07 DIAGNOSIS — Z992 Dependence on renal dialysis: Principal | ICD-10-CM

## 2013-05-07 DIAGNOSIS — I2729 Other secondary pulmonary hypertension: Secondary | ICD-10-CM

## 2013-05-07 DIAGNOSIS — J441 Chronic obstructive pulmonary disease with (acute) exacerbation: Secondary | ICD-10-CM

## 2013-05-08 ENCOUNTER — Ambulatory Visit (INDEPENDENT_AMBULATORY_CARE_PROVIDER_SITE_OTHER): Payer: Medicare Other | Admitting: Pulmonary Disease

## 2013-05-08 DIAGNOSIS — R0600 Dyspnea, unspecified: Secondary | ICD-10-CM

## 2013-05-08 DIAGNOSIS — R0989 Other specified symptoms and signs involving the circulatory and respiratory systems: Secondary | ICD-10-CM

## 2013-05-08 DIAGNOSIS — R0609 Other forms of dyspnea: Secondary | ICD-10-CM

## 2013-05-09 NOTE — Progress Notes (Signed)
34mw visit only

## 2013-05-13 ENCOUNTER — Ambulatory Visit (INDEPENDENT_AMBULATORY_CARE_PROVIDER_SITE_OTHER)
Admission: RE | Admit: 2013-05-13 | Discharge: 2013-05-13 | Disposition: A | Discharge status: Home / Self Care | Payer: Medicare Other | Source: Ambulatory Visit | Attending: Pulmonary Disease | Admitting: Pulmonary Disease

## 2013-05-13 ENCOUNTER — Ambulatory Visit (HOSPITAL_COMMUNITY): Payer: Medicare Other | Attending: Cardiovascular Disease | Admitting: Radiology

## 2013-05-13 ENCOUNTER — Encounter: Payer: Self-pay | Admitting: Pulmonary Disease

## 2013-05-13 ENCOUNTER — Encounter: Payer: Self-pay | Admitting: Neurology

## 2013-05-13 ENCOUNTER — Other Ambulatory Visit: Payer: Self-pay

## 2013-05-13 DIAGNOSIS — I509 Heart failure, unspecified: Secondary | ICD-10-CM

## 2013-05-13 DIAGNOSIS — I2510 Atherosclerotic heart disease of native coronary artery without angina pectoris: Secondary | ICD-10-CM

## 2013-05-13 DIAGNOSIS — N189 Chronic kidney disease, unspecified: Secondary | ICD-10-CM

## 2013-05-13 DIAGNOSIS — R942 Abnormal results of pulmonary function studies: Secondary | ICD-10-CM | POA: Diagnosis not present

## 2013-05-13 DIAGNOSIS — I2789 Other specified pulmonary heart diseases: Secondary | ICD-10-CM

## 2013-05-13 DIAGNOSIS — I2729 Other secondary pulmonary hypertension: Secondary | ICD-10-CM

## 2013-05-13 DIAGNOSIS — R0602 Shortness of breath: Secondary | ICD-10-CM | POA: Diagnosis not present

## 2013-05-13 DIAGNOSIS — J441 Chronic obstructive pulmonary disease with (acute) exacerbation: Secondary | ICD-10-CM

## 2013-05-13 DIAGNOSIS — Z992 Dependence on renal dialysis: Secondary | ICD-10-CM

## 2013-05-13 NOTE — Progress Notes (Signed)
Echocardiogram performed.  

## 2013-05-13 NOTE — Progress Notes (Signed)
Quick Note:  A,    Please let her know that her Echo showed heart failure and pulmonary hypertension. She needs to discuss this with her cardiologist and [place] on the next visit.    Thanks   B  ---------------------------------------------------------------- lmtcb X1 ______

## 2013-05-15 ENCOUNTER — Telehealth: Payer: Self-pay

## 2013-05-15 NOTE — Telephone Encounter (Signed)
A,        Please let her know that this showed heart failure but not ILD        Thanks    B     Attached to     CT CHEST HIGH RESOLUTION (Order# [id number])   ---------------------------------------------------------------------------------------  Spoke to patient to relay ct chest results and echo results.  She is aware of results and recs.  She said she would contact her cardiologist.  Nothing further needed at this time.

## 2013-05-15 NOTE — Progress Notes (Signed)
Quick Note:  See other result note dated for the same day. ______

## 2013-05-15 NOTE — Telephone Encounter (Signed)
Message copied by Len Blalock on Thu May 15, 2013  3:34 PM ------      Message from: [name]      Created: Tue May 13, 2013 12:20 PM       A,            Please let her know that her Echo showed heart failure and pulmonary hypertension.  She needs to discuss this with her cardiologist and [place] on the next visit.            Thanks      B ------

## 2013-06-01 DIAGNOSIS — N186 End stage renal disease: Secondary | ICD-10-CM | POA: Diagnosis not present

## 2013-06-02 DIAGNOSIS — E119 Type 2 diabetes mellitus without complications: Secondary | ICD-10-CM | POA: Diagnosis not present

## 2013-06-02 DIAGNOSIS — D631 Anemia in chronic kidney disease: Secondary | ICD-10-CM | POA: Diagnosis not present

## 2013-06-02 DIAGNOSIS — D509 Iron deficiency anemia, unspecified: Secondary | ICD-10-CM | POA: Diagnosis not present

## 2013-06-02 DIAGNOSIS — N2581 Secondary hyperparathyroidism of renal origin: Secondary | ICD-10-CM | POA: Diagnosis not present

## 2013-06-02 DIAGNOSIS — N186 End stage renal disease: Secondary | ICD-10-CM | POA: Diagnosis not present

## 2013-06-03 DIAGNOSIS — L290 Pruritus ani: Secondary | ICD-10-CM | POA: Diagnosis not present

## 2013-06-05 DIAGNOSIS — D101 Benign neoplasm of tongue: Secondary | ICD-10-CM | POA: Diagnosis not present

## 2013-06-05 DIAGNOSIS — D3709 Neoplasm of uncertain behavior of other specified sites of the oral cavity: Secondary | ICD-10-CM | POA: Diagnosis not present

## 2013-06-05 DIAGNOSIS — D3701 Neoplasm of uncertain behavior of lip: Secondary | ICD-10-CM | POA: Diagnosis not present

## 2013-06-10 ENCOUNTER — Other Ambulatory Visit: Payer: Self-pay | Admitting: Physician Assistant

## 2013-06-10 ENCOUNTER — Ambulatory Visit (INDEPENDENT_AMBULATORY_CARE_PROVIDER_SITE_OTHER): Payer: Medicare Other | Admitting: Physician Assistant

## 2013-06-10 ENCOUNTER — Encounter: Payer: Self-pay | Admitting: Physician Assistant

## 2013-06-10 VITALS — BP 130/60 | HR 69 | Ht 62.5 in | Wt 141.8 lb

## 2013-06-10 DIAGNOSIS — I10 Essential (primary) hypertension: Secondary | ICD-10-CM | POA: Diagnosis not present

## 2013-06-10 DIAGNOSIS — E785 Hyperlipidemia, unspecified: Secondary | ICD-10-CM

## 2013-06-10 DIAGNOSIS — I272 Pulmonary hypertension, unspecified: Secondary | ICD-10-CM

## 2013-06-10 DIAGNOSIS — N186 End stage renal disease: Secondary | ICD-10-CM

## 2013-06-10 DIAGNOSIS — I428 Other cardiomyopathies: Secondary | ICD-10-CM

## 2013-06-10 DIAGNOSIS — I2510 Atherosclerotic heart disease of native coronary artery without angina pectoris: Secondary | ICD-10-CM | POA: Diagnosis not present

## 2013-06-10 DIAGNOSIS — I429 Cardiomyopathy, unspecified: Secondary | ICD-10-CM

## 2013-06-10 DIAGNOSIS — I2789 Other specified pulmonary heart diseases: Secondary | ICD-10-CM

## 2013-06-10 MED ORDER — ISOSORBIDE MONONITRATE ER 30 MG PO TB24: 30.0000 mg | ORAL_TABLET | Freq: Every day | ORAL | Status: DC

## 2013-06-10 NOTE — Patient Instructions (Signed)
Your physician recommends that you return for lab work in: TODAY; SERUM PROTEIN AND URINE PROTEIN;   START IMDUR 30 MG DAILY; NEW RX SENT IN TODAY  PLEASE FOLLOW UP WITH DR. HOCHREIN IN 3-4 WEEKS

## 2013-06-10 NOTE — Progress Notes (Signed)
Cardiology Office Note   Date:  06/10/2013   ID:  [name], DOB April 07, 1945, MRN [id number]  PCP:  [name], MD  Cardiologist:  Dr. Minus Breeding      History of Present Illness: [name] is a 68 y.o. female with a hx of CAD s/p prior stent to the RCA, ESRD on hemodialysis, PAD (s/p stents in bilateral legs), HTN, HL, prior CVA in 2003, NICM (non-obstructive CAD at cath in 02/2011 with reduced EF  - ? Stress induced CM; EF recovered to normal in 04/2011), COPD, seizure disorder (s/p cardiac arrest in 04/2011).  Last seen by Dr. Minus Breeding in 09/2011.  She was admitted for AECOPD and HCAP.  She has followed up with pulmonology since.  She had PFTs that demonstrated no evidence of COPD but her DLCO was reduced.  Chest CT demonstrated CHF and interstitial edema, pericardial effusion and coronary calcifications.  No evidence of ILD was noted.  Echo was arranged and demonstrated severe pulmonary HTN with PASP 107 mmHg, reduced EF at 35-40% and bright myocardium concerning for infiltrative process.   She was previously followed by a cardiologist in [place] (Dr. Governor Specking) many years ago.    The patient notes mild dyspnea with exertion over many years without significant change. She is NYHA 2-2b.  She has slept on an incline chronically. She denies PND or significant pedal edema. She denies chest pain or syncope. She has been able to meet her dry weight at dialysis without significant difficulty.   Studies:  - LHC (02/2011):  Dist LM 10-20%, prox D1 30%, ostial RCA stent patent with 30-40% dist to RCA stent, EF 35-40% (akinesis of dist ant, apex, dist inf - suggestive of stress induced cardiomyopathy).    - Echo (04/2011):  Mod LVH, EF 50-55%, restrictive physiology, severe MAC, mild MR, mod to severe LAE, mild to mod TR, PASP 80 (severe pulmo HTN), mod effusion  - Echo (05/13/13):  Moderate LVH.  EF 35% to 40%. Diffuse HK.  Grade 4 diastolic dysfunction.  Trivial AI.  Mod MR.  Severe LAE.   Mod to severe RVE.  Mod reduced RVSF.  Mild RAE.  Severe TR.  PASP 181mm Hg Small to moderate pericardial effusion was identified. There was no evidence of hemodynamic compromise. Bright appearence to the myocardium. This conderning for an infiltrative process ( ? amyloidosis).    Recent Labs: 06/30/2012: ALT 8  07/01/2012: TSH 2.198  02/16/2013: Pro B Natriuretic peptide (BNP) 31758.0*  02/21/2013: Creatinine 5.39*; Hemoglobin 10.5*; Potassium 3.8   High Resolution Chest CT (05/2013): IMPRESSION: 1. The appearance of the chest suggests mild congestive heart failure, including a background of mild interstitial pulmonary edema and trace bilateral pleural effusions. Notably, there is also a small amount of pericardial fluid and/or thickening. Although this is unlikely to be of hemodynamic significance, correlation with nonemergent echocardiography may be warranted. 2. No imaging findings to suggest an underlying interstitial lung disease at this time. 3. Atherosclerosis, including left main and 3 vessel coronary artery disease. Please note that although the presence of coronary artery calcium documents the presence of coronary artery disease, the severity of this disease and any potential stenosis cannot be assessed on this non-gated CT examination. Assessment for potential risk factor modification, dietary therapy or pharmacologic therapy may be warranted, if clinically indicated   Wt Readings from Last 3 Encounters:  04/29/13 145 lb (65.772 kg)  03/18/13 144 lb (65.318 kg)  03/06/13 141 lb (63.957 kg)     Past Medical  History  Diagnosis Date  . CAD (coronary artery disease)     stent to RCA  . CVA (cerebral infarction) 2003    no apparent residual  . Hypothyroidism   . PVD (peripheral vascular disease)   . Hyperlipidemia   . Positive PPD     completed rifampin  . Diastolic congestive heart failure   . Hypertension   . COPD (chronic obstructive pulmonary disease) t  . Cancer      clear cell cancer, kidney  . Complication of anesthesia 12/2010    pt is very confused, with AMS with anesthesia  . CVA 07/27/2008    CVA affected cognition and memory per family, no focal deficits.    . ESRD 07/27/2008    ESRD due to HTN and NSAID's, started hemodialysis in 2005 in [place], [place]. Went to [facility] from 2010 to 2012 and since 2012 has been getting dialysis at [facility] on [address] in [place] on a MWF schedule. First access with RUA AVG placed in [place]. Next and current access was LUA AVG placed by Dr. Lucky Cowboy in [place] in or around 2012. Has had 2 or 3 procedures on that graft since placed per family. She gets her access work done here in [place] now. She is allergic to heparin and does not get any heparin at dialysis; she had an allergic reaction apparently when in ICU in the past.      . Encephalopathy   . Seizures   . Dyslipidemia   . Chronic renal insufficiency     On hemodialysis  . Arthritis of shoulder     Bilateral  . Depression     Current Outpatient Prescriptions  Medication Sig Dispense Refill  . acetaminophen (TYLENOL) 500 MG tablet Take 500 mg by mouth every 6 (six) hours as needed for pain.      [name] amLODipine (NORVASC) 10 MG tablet Take 10 mg by mouth at bedtime.       [name] aspirin EC 81 MG tablet Take 81 mg by mouth daily.      [name] BREO ELLIPTA 100-25 MCG/INH AEPB Inhale 1 puff into the lungs daily.  1 each  5  . carvedilol (COREG) 25 MG tablet Take 25 mg by mouth 2 (two) times daily with a meal.      . cinacalcet (SENSIPAR) 30 MG tablet Take 60 mg by mouth at bedtime.       . hydrALAZINE (APRESOLINE) 50 MG tablet Take 50 mg by mouth 3 (three) times daily.      . hydrocortisone cream 1 % Apply 1 application topically daily as needed for itching.      . levETIRAcetam (KEPPRA) 500 MG tablet Take 500 mg by mouth daily at 8 pm.      . levothyroxine (SYNTHROID, LEVOTHROID) 88 MCG tablet Take 88 mcg by mouth daily before breakfast.      .  NITROSTAT 0.4 MG SL tablet Place 0.4 mg under the tongue every 5 (five) minutes as needed. For chest pain.      [name] Phenylephrine-APAP-Guaifenesin (MUCINEX FAST-MAX COLD & SINUS) 10-650-400 MG/20ML LIQD Take 30 mLs by mouth daily as needed (for cold).      . pravastatin (PRAVACHOL) 20 MG tablet Take 20 mg by mouth daily.      . sevelamer carbonate (RENVELA) 800 MG tablet Take 800 mg by mouth. 4 tabs TID, 2 tabs with each snack      . traZODone (DESYREL) 50 MG tablet Take 50 mg by mouth at  bedtime as needed for sleep.       . valACYclovir (VALTREX) 500 MG tablet Take 1 tablet (500 mg total) by mouth at bedtime.  7 tablet  0   No current facility-administered medications for this visit.    Allergies:   Heparin; Iohexol; Phenytoin; Wellbutrin; Ampicillin; Meperidine hcl; Morphine; Penicillins; Valproic acid and related; Ace inhibitors; and Pentazocine lactate   Social History:  The patient  reports that she has been smoking Cigarettes.  She has a 53 pack-year smoking history. She has never used smokeless tobacco. She reports that she drinks alcohol. She reports that she does not use illicit drugs.   Family History:  The patient's family history includes Coronary artery disease in her sister; Dementia in her mother; Heart attack in her sister; Heart disease in her father; Hypertension in her brother and mother.   ROS:  Please see the history of present illness.   She denies significant bleeding or weight changes.   All other systems reviewed and negative.   PHYSICAL EXAM: VS:  BP 130/60  Pulse 69  Ht 5' 2.5" (1.588 m)  Wt 141 lb 12.8 oz (64.32 kg)  BMI 25.51 kg/m2 Well nourished, well developed, in no acute distress HEENT: normal Neck: + JVD Cardiac:  normal S1, S2; RRR; no murmur Lungs:  Decreased breath sounds bilaterally, no wheezing, rhonchi or rales Abd: soft, nontender, no hepatomegaly Ext: no edema Skin: warm and dry Neuro:  CNs 2-12 intact, no focal abnormalities noted  EKG:  NSR,  HR 69, LAD, low voltage in the limb leads, poor R wave progression, nonspecific ST-T wave changes     ASSESSMENT AND PLAN:  1. Cardiomyopathy: Etiology not clear. Previously, her ejection fraction returned to normal after her admission in 2013 with suspected stress-induced cardiomyopathy. She has a bright myocardium on echocardiogram suspicious for infiltrative cardiomyopathy. She has low voltage in limb leads on ECG. She has pericardial effusion noted on echocardiogram. These are all signs of potential amyloidosis. I reviewed this with Dr. Casandra Doffing (DOD).  The patient tells me that she is claustrophobic and would not be able to tolerate MRI.  Therefore, we have recommended proceeding with SPEP/UPEP. If she has no monoclonal paraprotein, she will likely need further workup to include right and left heart catheterization. If she has findings on SPEP/UPEP to suggest Amyloid, consider fat pad bx.  She will be brought back in close follow up with Dr. Percival Spanish to make further recommendations. Continue beta blocker, hydralazine. Add isosorbide 30 mg daily. She notes an allergy to ACE inhibitors. 2. CAD (coronary artery disease): No angina. Proceed with workup as above. Consider right and left heart catheterization in follow up. Continue aspirin, beta blocker, statin. 3. HTN (hypertension): Controlled. 4. End stage renal disease: She is on hemodialysis Monday, Wednesday, Friday. 5. Pulmonary hypertension: Likely secondary to combination of emphysema and CHF. As noted, she will likely require right and left heart catheterization at some point. 6. HLD (hyperlipidemia): Continue statin. 7. Disposition: Followup with Dr. Percival Spanish in 3-4 weeks.   Signed, Versie Starks, MHS 06/10/2013 9:01 AM    [facility] [address], [place], [place]  [address] Phone: [phone]; Fax: [phone]

## 2013-06-11 ENCOUNTER — Other Ambulatory Visit: Payer: Self-pay | Admitting: Physician Assistant

## 2013-06-11 LAB — UPEP/TP, 24-HR URINE

## 2013-06-11 LAB — PROTEIN, URINE, RANDOM: TOTAL PROTEIN, URINE: 154 mg/dL

## 2013-06-12 LAB — PROTEIN ELECTROPHORESIS, SERUM, WITH REFLEX
ALPHA-2-GLOBULIN: 11.9 % — AB (ref 7.1–11.8)
Albumin ELP: 60.7 % (ref 55.8–66.1)
Alpha-1-Globulin: 6 % — ABNORMAL HIGH (ref 2.9–4.9)
Beta 2: 5.5 % (ref 3.2–6.5)
Beta Globulin: 5.9 % (ref 4.7–7.2)
GAMMA GLOBULIN: 10 % — AB (ref 11.1–18.8)
TOTAL PROTEIN, SERUM ELECTROPHOR: 6 g/dL (ref 6.0–8.3)

## 2013-06-12 LAB — IFE INTERPRETATION

## 2013-06-12 LAB — IGG, IGA, IGM
IGA: 262 mg/dL (ref 69–380)
IGG (IMMUNOGLOBIN G), SERUM: 598 mg/dL — AB (ref 690–1700)
IgM, Serum: 92 mg/dL (ref 52–322)

## 2013-06-17 DIAGNOSIS — T82898A Other specified complication of vascular prosthetic devices, implants and grafts, initial encounter: Secondary | ICD-10-CM | POA: Diagnosis not present

## 2013-06-17 DIAGNOSIS — N186 End stage renal disease: Secondary | ICD-10-CM | POA: Diagnosis not present

## 2013-06-17 DIAGNOSIS — I871 Compression of vein: Secondary | ICD-10-CM | POA: Diagnosis not present

## 2013-06-23 ENCOUNTER — Telehealth: Payer: Self-pay | Admitting: *Deleted

## 2013-06-23 NOTE — Telephone Encounter (Signed)
pt notified about serum electrophersis lab results, UPEP still pending. Pt has been sheduled for Dr. Percival Spanish 06/26/13 @ 2 pm, will verify with RN this appt time ok for Dr. Percival Spanish

## 2013-06-26 ENCOUNTER — Encounter: Payer: Self-pay | Admitting: Cardiology

## 2013-06-26 ENCOUNTER — Ambulatory Visit (INDEPENDENT_AMBULATORY_CARE_PROVIDER_SITE_OTHER): Payer: Medicare Other | Admitting: Cardiology

## 2013-06-26 VITALS — BP 130/60 | HR 100 | Ht 62.0 in | Wt 148.0 lb

## 2013-06-26 DIAGNOSIS — I2510 Atherosclerotic heart disease of native coronary artery without angina pectoris: Secondary | ICD-10-CM

## 2013-06-26 DIAGNOSIS — I10 Essential (primary) hypertension: Secondary | ICD-10-CM

## 2013-06-26 NOTE — Progress Notes (Signed)
HPI Patient presents for followup of her nonobsructive coronary disease. She has end-stage renal disease and is on dialysis. She has had an extensive workup for dyspnea and this included a recent echocardiogram. This demonstrated pulmonary pressure to be 107 with an EF of 35-40%. She did have an evaluation in our office for consideration of an MRI because of a possible speckled) on echo. However, she could not have this done as she has some problems with claustrophobia. She had SPEP and UPEP sent off. There was no protein spike on the SPEP and UPEP is pending.  She came back for followup. She actually she feels fine. She's not reporting any acute dyspnea. She's chronically slept on 3 pillows. She's not having any new weight gain or edema. She tolerates dialysis. She denies any chest pressure, neck or arm discomfort.  Allergies  Allergen Reactions  . Heparin Other (See Comments)    MDs told her not to take after reaction in ICU  . Iohexol Swelling and Other (See Comments)    1970s; passed out and had facial/tongue swelling.  Requires 13-hour prep with prednisone and benadryl  . Phenytoin Other (See Comments)    Had reaction while in ICU; doesn't know.  MDs told her not to take ever again.  . Wellbutrin [Bupropion] Other (See Comments)    seizures  . Ampicillin Hives and Rash  . Meperidine Hcl Swelling and Rash    Makes tongue swell  . Morphine Rash  . Penicillins Rash  . Valproic Acid And Related Other (See Comments)    Confusion   . Ace Inhibitors Rash  . Pentazocine Lactate Other (See Comments)    Patient does not remember reaction to this med (Talwin).     Current Outpatient Prescriptions  Medication Sig Dispense Refill  . acetaminophen (TYLENOL) 500 MG tablet Take 500 mg by mouth every 6 (six) hours as needed for pain.      [name] amLODipine (NORVASC) 10 MG tablet Take 10 mg by mouth at bedtime.       [name] aspirin EC 81 MG tablet Take 81 mg by mouth daily.      . carvedilol (COREG) 25  MG tablet Take 25 mg by mouth 2 (two) times daily with a meal.      . cinacalcet (SENSIPAR) 30 MG tablet Take 60 mg by mouth at bedtime.       . hydrALAZINE (APRESOLINE) 50 MG tablet Take 50 mg by mouth 3 (three) times daily.      . hydrocortisone cream 1 % Apply 1 application topically daily as needed for itching.      . isosorbide mononitrate (IMDUR) 30 MG 24 hr tablet Take 1 tablet (30 mg total) by mouth daily.  30 tablet  11  . levETIRAcetam (KEPPRA) 500 MG tablet Take 500 mg by mouth daily at 8 pm.      . levothyroxine (SYNTHROID, LEVOTHROID) 88 MCG tablet Take 88 mcg by mouth daily before breakfast.      . NITROSTAT 0.4 MG SL tablet Place 0.4 mg under the tongue every 5 (five) minutes as needed. For chest pain.      [name] Phenylephrine-APAP-Guaifenesin (MUCINEX FAST-MAX COLD & SINUS) 10-650-400 MG/20ML LIQD Take 30 mLs by mouth daily as needed (for cold).      . pravastatin (PRAVACHOL) 20 MG tablet Take 20 mg by mouth daily.      . sevelamer carbonate (RENVELA) 800 MG tablet Take 800 mg by mouth. 4 tabs TID, 2 tabs with each  snack      . traZODone (DESYREL) 50 MG tablet Take 50 mg by mouth at bedtime as needed for sleep.       [name] triamcinolone ointment (KENALOG) 0.1 % Apply 1 application topically 2 (two) times daily. OINTMENT SKIN      . valACYclovir (VALTREX) 500 MG tablet Take 1 tablet (500 mg total) by mouth at bedtime.  7 tablet  0   No current facility-administered medications for this visit.    Past Medical History  Diagnosis Date  . CAD (coronary artery disease)     stent to RCA  . CVA (cerebral infarction) 2003    no apparent residual  . Hypothyroidism   . PVD (peripheral vascular disease)   . Hyperlipidemia   . Positive PPD     completed rifampin  . Diastolic congestive heart failure   . Hypertension   . COPD (chronic obstructive pulmonary disease) t  . Cancer     clear cell cancer, kidney  . Complication of anesthesia 12/2010    pt is very confused, with AMS with  anesthesia  . CVA 07/27/2008    CVA affected cognition and memory per family, no focal deficits.    . ESRD 07/27/2008    ESRD due to HTN and NSAID's, started hemodialysis in 2005 in [place], [place]. Went to [facility] from 2010 to 2012 and since 2012 has been getting dialysis at [facility] on [address] in [place] on a MWF schedule. First access with RUA AVG placed in [place]. Next and current access was LUA AVG placed by Dr. Lucky Cowboy in [place] in or around 2012. Has had 2 or 3 procedures on that graft since placed per family. She gets her access work done here in [place] now. She is allergic to heparin and does not get any heparin at dialysis; she had an allergic reaction apparently when in ICU in the past.      . Encephalopathy   . Seizures   . Dyslipidemia   . Chronic renal insufficiency     On hemodialysis  . Arthritis of shoulder     Bilateral  . Depression     Past Surgical History  Procedure Laterality Date  . Abdominal hysterectomy    . Cholecystectomy    . D&cs    . Right ankle repair    . Left nephrectomy    . Vascular surgery  11/2010    graft inserted to left arm  . Nephrectomy Left     Malignant tumor  . Cataract extraction Bilateral   . Tonsillectomy    . Cardiac catheterization      ROS:  As stated in the HPI and negative for all other systems.  PHYSICAL EXAM BP 130/60  Pulse 100  Ht 5\' 2"  (1.575 m)  Wt 148 lb (67.132 kg)  BMI 27.06 kg/m2 GENERAL:  Well appearing HEENT:  Pupils equal round and reactive, fundi not visualized, oral mucosa unremarkable NECK:  Jugular venous distention, waveform within normal limits, carotid upstroke brisk and symmetric, transmitted systolic bruit, no thyromegaly LYMPHATICS:  No cervical, inguinal adenopathy LUNGS:  Clear to auscultation bilaterally BACK:  No CVA tenderness CHEST:  Unremarkable HEART:  PMI not displaced or sustained,S1 and S2 within normal limits, no S3, no S4, no clicks, no rubs, apical systolic  murmur early peaking and radiating out the outflow tract. ABD:  Flat, positive bowel sounds normal in frequency in pitch, no bruits, no rebound, no guarding, no midline pulsatile mass, no hepatomegaly, no splenomegaly  EXT:  2 plus pulses throughout, right arm edema, clotted right arm fistula, thrill bruit left upper arm fistula,  no cyanosis no clubbing   EKG:  Sinus rhythm, rate 56, left axis deviation,  poor anterior R wave progression, no acute ST-T wave changes.  06/26/2013   ASSESSMENT AND PLAN  CHF - Her ejection fraction as low as previous most recent evaluation but similar to one she had in the past. What is most impressive is the pulmonary hypertension. This was evident before but it seems to have progressed. However, she does not describe severe symptoms. She thinks she is doing well and would prefer to minimize evaluation. At this point there is no evidence for an infiltrative disease.  I will not plan a right heart cath.  I will see her back in about 3 months to see if symptoms have progressed. [name]  PULMONARY HTN - As above.    CAD -  The patient had nonobstructive disease previously. She has no new symptoms. No further cardiovascular testing is suggested.  TOBACCO USER -  She's tried many times to quit smoking and has failed all available therapies.  She continues to be educated.   ESSENTIAL HYPERTENSION, MALIGNANT -  The blood pressure is at target. No change in medications is indicated. We will continue with therapeutic lifestyle changes (TLC).

## 2013-06-26 NOTE — Patient Instructions (Signed)
Your physician wants you to follow-up in: 6 months with dr hochrein You will receive a reminder letter in the mail two months in advance. If you don't receive a letter, please call our office to schedule the follow-up appointment.

## 2013-07-01 DIAGNOSIS — N186 End stage renal disease: Secondary | ICD-10-CM | POA: Diagnosis not present

## 2013-07-02 DIAGNOSIS — D631 Anemia in chronic kidney disease: Secondary | ICD-10-CM | POA: Diagnosis not present

## 2013-07-02 DIAGNOSIS — E119 Type 2 diabetes mellitus without complications: Secondary | ICD-10-CM | POA: Diagnosis not present

## 2013-07-02 DIAGNOSIS — D509 Iron deficiency anemia, unspecified: Secondary | ICD-10-CM | POA: Diagnosis not present

## 2013-07-02 DIAGNOSIS — N2581 Secondary hyperparathyroidism of renal origin: Secondary | ICD-10-CM | POA: Diagnosis not present

## 2013-07-02 DIAGNOSIS — N186 End stage renal disease: Secondary | ICD-10-CM | POA: Diagnosis not present

## 2013-07-29 ENCOUNTER — Telehealth: Payer: Self-pay | Admitting: Cardiology

## 2013-07-29 NOTE — Telephone Encounter (Signed)
Please call,said she was suppose to bring you something.

## 2013-07-31 NOTE — Telephone Encounter (Signed)
Deferred to [facility] 

## 2013-08-01 DIAGNOSIS — N186 End stage renal disease: Secondary | ICD-10-CM | POA: Diagnosis not present

## 2013-08-01 NOTE — Telephone Encounter (Signed)
Pt. Called no answer and voice mail has not been set up yet

## 2013-08-04 DIAGNOSIS — D509 Iron deficiency anemia, unspecified: Secondary | ICD-10-CM | POA: Diagnosis not present

## 2013-08-04 DIAGNOSIS — D631 Anemia in chronic kidney disease: Secondary | ICD-10-CM | POA: Diagnosis not present

## 2013-08-04 DIAGNOSIS — E119 Type 2 diabetes mellitus without complications: Secondary | ICD-10-CM | POA: Diagnosis not present

## 2013-08-04 DIAGNOSIS — N2581 Secondary hyperparathyroidism of renal origin: Secondary | ICD-10-CM | POA: Diagnosis not present

## 2013-08-04 DIAGNOSIS — N186 End stage renal disease: Secondary | ICD-10-CM | POA: Diagnosis not present

## 2013-08-06 DIAGNOSIS — N2581 Secondary hyperparathyroidism of renal origin: Secondary | ICD-10-CM | POA: Diagnosis not present

## 2013-08-06 DIAGNOSIS — N186 End stage renal disease: Secondary | ICD-10-CM | POA: Diagnosis not present

## 2013-08-06 DIAGNOSIS — E119 Type 2 diabetes mellitus without complications: Secondary | ICD-10-CM | POA: Diagnosis not present

## 2013-08-06 DIAGNOSIS — D631 Anemia in chronic kidney disease: Secondary | ICD-10-CM | POA: Diagnosis not present

## 2013-08-06 DIAGNOSIS — D509 Iron deficiency anemia, unspecified: Secondary | ICD-10-CM | POA: Diagnosis not present

## 2013-08-07 ENCOUNTER — Ambulatory Visit: Payer: Medicare Other | Admitting: Neurology

## 2013-08-08 DIAGNOSIS — N2581 Secondary hyperparathyroidism of renal origin: Secondary | ICD-10-CM | POA: Diagnosis not present

## 2013-08-08 DIAGNOSIS — D509 Iron deficiency anemia, unspecified: Secondary | ICD-10-CM | POA: Diagnosis not present

## 2013-08-08 DIAGNOSIS — E119 Type 2 diabetes mellitus without complications: Secondary | ICD-10-CM | POA: Diagnosis not present

## 2013-08-08 DIAGNOSIS — N186 End stage renal disease: Secondary | ICD-10-CM | POA: Diagnosis not present

## 2013-08-08 DIAGNOSIS — D631 Anemia in chronic kidney disease: Secondary | ICD-10-CM | POA: Diagnosis not present

## 2013-08-11 DIAGNOSIS — D509 Iron deficiency anemia, unspecified: Secondary | ICD-10-CM | POA: Diagnosis not present

## 2013-08-11 DIAGNOSIS — N2581 Secondary hyperparathyroidism of renal origin: Secondary | ICD-10-CM | POA: Diagnosis not present

## 2013-08-11 DIAGNOSIS — D631 Anemia in chronic kidney disease: Secondary | ICD-10-CM | POA: Diagnosis not present

## 2013-08-11 DIAGNOSIS — E119 Type 2 diabetes mellitus without complications: Secondary | ICD-10-CM | POA: Diagnosis not present

## 2013-08-11 DIAGNOSIS — N186 End stage renal disease: Secondary | ICD-10-CM | POA: Diagnosis not present

## 2013-08-13 DIAGNOSIS — N2581 Secondary hyperparathyroidism of renal origin: Secondary | ICD-10-CM | POA: Diagnosis not present

## 2013-08-13 DIAGNOSIS — D631 Anemia in chronic kidney disease: Secondary | ICD-10-CM | POA: Diagnosis not present

## 2013-08-13 DIAGNOSIS — E119 Type 2 diabetes mellitus without complications: Secondary | ICD-10-CM | POA: Diagnosis not present

## 2013-08-13 DIAGNOSIS — D509 Iron deficiency anemia, unspecified: Secondary | ICD-10-CM | POA: Diagnosis not present

## 2013-08-13 DIAGNOSIS — N186 End stage renal disease: Secondary | ICD-10-CM | POA: Diagnosis not present

## 2013-08-15 DIAGNOSIS — N2581 Secondary hyperparathyroidism of renal origin: Secondary | ICD-10-CM | POA: Diagnosis not present

## 2013-08-15 DIAGNOSIS — N186 End stage renal disease: Secondary | ICD-10-CM | POA: Diagnosis not present

## 2013-08-15 DIAGNOSIS — E119 Type 2 diabetes mellitus without complications: Secondary | ICD-10-CM | POA: Diagnosis not present

## 2013-08-15 DIAGNOSIS — D509 Iron deficiency anemia, unspecified: Secondary | ICD-10-CM | POA: Diagnosis not present

## 2013-08-15 DIAGNOSIS — D631 Anemia in chronic kidney disease: Secondary | ICD-10-CM | POA: Diagnosis not present

## 2013-08-18 DIAGNOSIS — D509 Iron deficiency anemia, unspecified: Secondary | ICD-10-CM | POA: Diagnosis not present

## 2013-08-18 DIAGNOSIS — E119 Type 2 diabetes mellitus without complications: Secondary | ICD-10-CM | POA: Diagnosis not present

## 2013-08-18 DIAGNOSIS — N186 End stage renal disease: Secondary | ICD-10-CM | POA: Diagnosis not present

## 2013-08-18 DIAGNOSIS — D631 Anemia in chronic kidney disease: Secondary | ICD-10-CM | POA: Diagnosis not present

## 2013-08-18 DIAGNOSIS — N2581 Secondary hyperparathyroidism of renal origin: Secondary | ICD-10-CM | POA: Diagnosis not present

## 2013-08-20 DIAGNOSIS — N186 End stage renal disease: Secondary | ICD-10-CM | POA: Diagnosis not present

## 2013-08-20 DIAGNOSIS — E119 Type 2 diabetes mellitus without complications: Secondary | ICD-10-CM | POA: Diagnosis not present

## 2013-08-20 DIAGNOSIS — D631 Anemia in chronic kidney disease: Secondary | ICD-10-CM | POA: Diagnosis not present

## 2013-08-20 DIAGNOSIS — N039 Chronic nephritic syndrome with unspecified morphologic changes: Secondary | ICD-10-CM | POA: Diagnosis not present

## 2013-08-20 DIAGNOSIS — D509 Iron deficiency anemia, unspecified: Secondary | ICD-10-CM | POA: Diagnosis not present

## 2013-08-20 DIAGNOSIS — N2581 Secondary hyperparathyroidism of renal origin: Secondary | ICD-10-CM | POA: Diagnosis not present

## 2013-08-22 DIAGNOSIS — N039 Chronic nephritic syndrome with unspecified morphologic changes: Secondary | ICD-10-CM | POA: Diagnosis not present

## 2013-08-22 DIAGNOSIS — N186 End stage renal disease: Secondary | ICD-10-CM | POA: Diagnosis not present

## 2013-08-22 DIAGNOSIS — D509 Iron deficiency anemia, unspecified: Secondary | ICD-10-CM | POA: Diagnosis not present

## 2013-08-22 DIAGNOSIS — N2581 Secondary hyperparathyroidism of renal origin: Secondary | ICD-10-CM | POA: Diagnosis not present

## 2013-08-22 DIAGNOSIS — E119 Type 2 diabetes mellitus without complications: Secondary | ICD-10-CM | POA: Diagnosis not present

## 2013-08-22 DIAGNOSIS — D631 Anemia in chronic kidney disease: Secondary | ICD-10-CM | POA: Diagnosis not present

## 2013-08-25 DIAGNOSIS — D631 Anemia in chronic kidney disease: Secondary | ICD-10-CM | POA: Diagnosis not present

## 2013-08-25 DIAGNOSIS — N2581 Secondary hyperparathyroidism of renal origin: Secondary | ICD-10-CM | POA: Diagnosis not present

## 2013-08-25 DIAGNOSIS — E119 Type 2 diabetes mellitus without complications: Secondary | ICD-10-CM | POA: Diagnosis not present

## 2013-08-25 DIAGNOSIS — D509 Iron deficiency anemia, unspecified: Secondary | ICD-10-CM | POA: Diagnosis not present

## 2013-08-25 DIAGNOSIS — N186 End stage renal disease: Secondary | ICD-10-CM | POA: Diagnosis not present

## 2013-08-27 DIAGNOSIS — D509 Iron deficiency anemia, unspecified: Secondary | ICD-10-CM | POA: Diagnosis not present

## 2013-08-27 DIAGNOSIS — D631 Anemia in chronic kidney disease: Secondary | ICD-10-CM | POA: Diagnosis not present

## 2013-08-27 DIAGNOSIS — N186 End stage renal disease: Secondary | ICD-10-CM | POA: Diagnosis not present

## 2013-08-27 DIAGNOSIS — N2581 Secondary hyperparathyroidism of renal origin: Secondary | ICD-10-CM | POA: Diagnosis not present

## 2013-08-27 DIAGNOSIS — E119 Type 2 diabetes mellitus without complications: Secondary | ICD-10-CM | POA: Diagnosis not present

## 2013-08-29 DIAGNOSIS — N2581 Secondary hyperparathyroidism of renal origin: Secondary | ICD-10-CM | POA: Diagnosis not present

## 2013-08-29 DIAGNOSIS — D509 Iron deficiency anemia, unspecified: Secondary | ICD-10-CM | POA: Diagnosis not present

## 2013-08-29 DIAGNOSIS — E119 Type 2 diabetes mellitus without complications: Secondary | ICD-10-CM | POA: Diagnosis not present

## 2013-08-29 DIAGNOSIS — N039 Chronic nephritic syndrome with unspecified morphologic changes: Secondary | ICD-10-CM | POA: Diagnosis not present

## 2013-08-29 DIAGNOSIS — N186 End stage renal disease: Secondary | ICD-10-CM | POA: Diagnosis not present

## 2013-08-29 DIAGNOSIS — D631 Anemia in chronic kidney disease: Secondary | ICD-10-CM | POA: Diagnosis not present

## 2013-09-01 DIAGNOSIS — D509 Iron deficiency anemia, unspecified: Secondary | ICD-10-CM | POA: Diagnosis not present

## 2013-09-01 DIAGNOSIS — N2581 Secondary hyperparathyroidism of renal origin: Secondary | ICD-10-CM | POA: Diagnosis not present

## 2013-09-01 DIAGNOSIS — N186 End stage renal disease: Secondary | ICD-10-CM | POA: Diagnosis not present

## 2013-09-01 DIAGNOSIS — D631 Anemia in chronic kidney disease: Secondary | ICD-10-CM | POA: Diagnosis not present

## 2013-09-01 DIAGNOSIS — E119 Type 2 diabetes mellitus without complications: Secondary | ICD-10-CM | POA: Diagnosis not present

## 2013-09-03 ENCOUNTER — Encounter: Payer: Self-pay | Admitting: Cardiology

## 2013-09-03 ENCOUNTER — Telehealth: Payer: Self-pay | Admitting: Cardiology

## 2013-09-03 DIAGNOSIS — N186 End stage renal disease: Secondary | ICD-10-CM | POA: Diagnosis not present

## 2013-09-03 DIAGNOSIS — D631 Anemia in chronic kidney disease: Secondary | ICD-10-CM | POA: Diagnosis not present

## 2013-09-03 DIAGNOSIS — D509 Iron deficiency anemia, unspecified: Secondary | ICD-10-CM | POA: Diagnosis not present

## 2013-09-03 DIAGNOSIS — N2581 Secondary hyperparathyroidism of renal origin: Secondary | ICD-10-CM | POA: Diagnosis not present

## 2013-09-03 DIAGNOSIS — E119 Type 2 diabetes mellitus without complications: Secondary | ICD-10-CM | POA: Diagnosis not present

## 2013-09-04 NOTE — Telephone Encounter (Signed)
Closed encounter °

## 2013-09-23 ENCOUNTER — Telehealth: Payer: Self-pay | Admitting: Cardiology

## 2013-09-23 NOTE — Telephone Encounter (Signed)
Closed encounter °

## 2013-09-25 ENCOUNTER — Ambulatory Visit (INDEPENDENT_AMBULATORY_CARE_PROVIDER_SITE_OTHER): Payer: Medicare Other | Admitting: Cardiology

## 2013-09-25 ENCOUNTER — Encounter: Payer: Self-pay | Admitting: Cardiology

## 2013-09-25 VITALS — BP 138/78 | HR 75 | Ht 62.0 in | Wt 146.0 lb

## 2013-09-25 DIAGNOSIS — I2510 Atherosclerotic heart disease of native coronary artery without angina pectoris: Secondary | ICD-10-CM

## 2013-09-25 DIAGNOSIS — I10 Essential (primary) hypertension: Secondary | ICD-10-CM

## 2013-09-25 NOTE — Progress Notes (Signed)
HPI Patient presents for followup of her nonobsructive coronary disease. She has end-stage renal disease and is on dialysis. She has had an extensive workup for dyspnea and this included a recent echocardiogram. This demonstrated pulmonary pressure to be 107 with an EF of 35-40%. She did have an evaluation in our office for consideration of an MRI because of a possible speckled) on echo. However, she could not have this done as she has some problems with claustrophobia. She had SPEP sent off. There was no protein spike on the SPEP  Since she has had this work up she reports that she feels well.  The patient denies any new symptoms such as chest discomfort, neck or arm discomfort. There has been no new shortness of breath, PND or orthopnea. There have been no reported palpitations, presyncope or syncope.   She might get some SOB if she hurries but not with usual activity.    Allergies  Allergen Reactions  . Heparin Other (See Comments)    MDs told her not to take after reaction in ICU  . Iohexol Swelling and Other (See Comments)    1970s; passed out and had facial/tongue swelling.  Requires 13-hour prep with prednisone and benadryl  . Phenytoin Other (See Comments)    Had reaction while in ICU; doesn't know.  MDs told her not to take ever again.  . Wellbutrin [Bupropion] Other (See Comments)    seizures  . Ampicillin Hives and Rash  . Meperidine Hcl Swelling and Rash    Makes tongue swell  . Morphine Rash  . Penicillins Rash  . Valproic Acid And Related Other (See Comments)    Confusion   . Ace Inhibitors Rash  . Pentazocine Lactate Other (See Comments)    Patient does not remember reaction to this med ([place]).     Current Outpatient Prescriptions  Medication Sig Dispense Refill  . acetaminophen (TYLENOL) 500 MG tablet Take 500 mg by mouth every 6 (six) hours as needed for pain.      [name] amLODipine (NORVASC) 10 MG tablet Take 10 mg by mouth at bedtime.       [name] aspirin EC 81 MG  tablet Take 81 mg by mouth daily.      . carvedilol (COREG) 25 MG tablet Take 25 mg by mouth 2 (two) times daily with a meal.      . cinacalcet (SENSIPAR) 30 MG tablet Take 60 mg by mouth at bedtime.       . clindamycin (CLEOCIN) 300 MG capsule Take 1 capsule by mouth 3 (three) times daily.      . hydrALAZINE (APRESOLINE) 50 MG tablet Take 50 mg by mouth 3 (three) times daily.      . hydrocortisone cream 1 % Apply 1 application topically daily as needed for itching.      . isosorbide mononitrate (IMDUR) 30 MG 24 hr tablet Take 1 tablet (30 mg total) by mouth daily.  30 tablet  11  . levETIRAcetam (KEPPRA) 500 MG tablet Take 500 mg by mouth daily at 8 pm.      . levothyroxine (SYNTHROID, LEVOTHROID) 88 MCG tablet Take 88 mcg by mouth daily before breakfast.      . NITROSTAT 0.4 MG SL tablet Place 0.4 mg under the tongue every 5 (five) minutes as needed. For chest pain.      [name] oxyCODONE-acetaminophen (PERCOCET/ROXICET) 5-325 MG per tablet Take 1 tablet by mouth 3 (three) times daily.      [name] Phenylephrine-APAP-Guaifenesin ([place] FAST-MAX  COLD & SINUS) 10-650-400 MG/20ML LIQD Take 30 mLs by mouth daily as needed (for cold).      . pravastatin (PRAVACHOL) 20 MG tablet Take 20 mg by mouth daily.      . sevelamer carbonate (RENVELA) 800 MG tablet Take 800 mg by mouth. 4 tabs TID, 2 tabs with each snack      . traZODone (DESYREL) 50 MG tablet Take 50 mg by mouth at bedtime as needed for sleep.       [name] triamcinolone ointment (KENALOG) 0.1 % Apply 1 application topically 2 (two) times daily. OINTMENT SKIN      . valACYclovir (VALTREX) 500 MG tablet Take 1 tablet (500 mg total) by mouth at bedtime.  7 tablet  0   No current facility-administered medications for this visit.    Past Medical History  Diagnosis Date  . CAD (coronary artery disease)     stent to RCA  . CVA (cerebral infarction) 2003    no apparent residual  . Hypothyroidism   . PVD (peripheral vascular disease)   . Hyperlipidemia   .  Positive PPD     completed rifampin  . Diastolic congestive heart failure   . Hypertension   . COPD (chronic obstructive pulmonary disease) t  . Cancer     clear cell cancer, kidney  . Complication of anesthesia 12/2010    pt is very confused, with AMS with anesthesia  . CVA 07/27/2008    CVA affected cognition and memory per family, no focal deficits.    . ESRD 07/27/2008    ESRD due to HTN and NSAID's, started hemodialysis in 2005 in [place], [place]. Went to [facility] from 2010 to 2012 and since 2012 has been getting dialysis at [facility] on [address] in [place] on a MWF schedule. First access with RUA AVG placed in [place]. Next and current access was LUA AVG placed by Dr. Lucky Cowboy in [place] in or around 2012. Has had 2 or 3 procedures on that graft since placed per family. She gets her access work done here in [place] now. She is allergic to heparin and does not get any heparin at dialysis; she had an allergic reaction apparently when in ICU in the past.      . Encephalopathy   . Seizures   . Dyslipidemia   . Chronic renal insufficiency     On hemodialysis  . Arthritis of shoulder     Bilateral  . Depression     Past Surgical History  Procedure Laterality Date  . Abdominal hysterectomy    . Cholecystectomy    . D&cs    . Right ankle repair    . Left nephrectomy    . Vascular surgery  11/2010    graft inserted to left arm  . Nephrectomy Left     Malignant tumor  . Cataract extraction Bilateral   . Tonsillectomy    . Cardiac catheterization      ROS:  Dental pain.  Otherwise as stated in the HPI and negative for all other systems.  PHYSICAL EXAM BP 138/78  Pulse 75  Ht 5\' 2"  (1.575 m)  Wt 146 lb (66.225 kg)  BMI 26.70 kg/m2 GENERAL:  Well appearing HEENT:  Pupils equal round and reactive, fundi not visualized, oral mucosa unremarkable NECK:  Jugular venous distention, waveform within normal limits, carotid upstroke brisk and symmetric, transmitted  systolic bruit, no thyromegaly LYMPHATICS:  No cervical, inguinal adenopathy LUNGS:  Clear to auscultation bilaterally BACK:  No  CVA tenderness CHEST:  Unremarkable HEART:  PMI not displaced or sustained,S1 and S2 within normal limits, no S3, no S4, no clicks, no rubs, apical systolic murmur early peaking and radiating out the outflow tract. ABD:  Flat, positive bowel sounds normal in frequency in pitch, no bruits, no rebound, no guarding, no midline pulsatile mass, no hepatomegaly, no splenomegaly EXT:  2 plus pulses throughout, right arm edema, clotted right arm fistula, thrill bruit left upper arm fistula,  no cyanosis no clubbing   EKG:  Sinus rhythm, rate 75, left axis deviation,  poor anterior R wave progression, no acute ST-T wave changes.  PVCs   09/25/2013   ASSESSMENT AND PLAN  CHF - She does have a reduced EF but this had been evident on previous echos and has been evaluated.  She does have pulmonary HTN.  However, she insists that she is feeling well.  She would prefer not to have further treatment and I don't believe that right heart cath would add to treatment.  I do not suspect new obstructive CAD.  Finally, she could not tolerate an MRI so we cannot exclude an infiltrative cardiomyopathy  SPEP was negative.  No chagne in therapy is planned.   PULMONARY HTN - As above.    CAD -  The patient had nonobstructive disease previously. She has no new symptoms. No further cardiovascular testing is suggested.  TOBACCO USER -  She's tried many times to quit smoking and has failed all available therapies.  She doesn't want to quit  ESSENTIAL HYPERTENSION, MALIGNANT -  The blood pressure is at target. No change in medications is indicated. We will continue with therapeutic lifestyle changes (TLC).

## 2013-09-25 NOTE — Patient Instructions (Signed)
Your physician recommends that you schedule a follow-up appointment in: 6 months with Dr. Hochrein  

## 2013-10-01 DIAGNOSIS — N186 End stage renal disease: Secondary | ICD-10-CM | POA: Diagnosis not present

## 2013-10-03 DIAGNOSIS — N186 End stage renal disease: Secondary | ICD-10-CM | POA: Diagnosis not present

## 2013-10-03 DIAGNOSIS — Z23 Encounter for immunization: Secondary | ICD-10-CM | POA: Diagnosis not present

## 2013-10-03 DIAGNOSIS — D509 Iron deficiency anemia, unspecified: Secondary | ICD-10-CM | POA: Diagnosis not present

## 2013-10-03 DIAGNOSIS — E119 Type 2 diabetes mellitus without complications: Secondary | ICD-10-CM | POA: Diagnosis not present

## 2013-10-03 DIAGNOSIS — D631 Anemia in chronic kidney disease: Secondary | ICD-10-CM | POA: Diagnosis not present

## 2013-10-03 DIAGNOSIS — N2581 Secondary hyperparathyroidism of renal origin: Secondary | ICD-10-CM | POA: Diagnosis not present

## 2013-10-06 ENCOUNTER — Emergency Department (HOSPITAL_COMMUNITY): Payer: Medicare Other

## 2013-10-06 ENCOUNTER — Encounter (HOSPITAL_COMMUNITY): Payer: Self-pay | Admitting: Emergency Medicine

## 2013-10-06 ENCOUNTER — Emergency Department (HOSPITAL_COMMUNITY)
Admission: EM | Admit: 2013-10-06 | Discharge: 2013-10-06 | Disposition: A | Discharge status: Home / Self Care | Payer: Medicare Other | Attending: Emergency Medicine | Admitting: Emergency Medicine

## 2013-10-06 DIAGNOSIS — Z992 Dependence on renal dialysis: Secondary | ICD-10-CM | POA: Diagnosis not present

## 2013-10-06 DIAGNOSIS — Z7951 Long term (current) use of inhaled steroids: Secondary | ICD-10-CM | POA: Insufficient documentation

## 2013-10-06 DIAGNOSIS — N186 End stage renal disease: Secondary | ICD-10-CM | POA: Diagnosis not present

## 2013-10-06 DIAGNOSIS — I5030 Unspecified diastolic (congestive) heart failure: Secondary | ICD-10-CM | POA: Diagnosis not present

## 2013-10-06 DIAGNOSIS — J40 Bronchitis, not specified as acute or chronic: Secondary | ICD-10-CM | POA: Diagnosis not present

## 2013-10-06 DIAGNOSIS — Z792 Long term (current) use of antibiotics: Secondary | ICD-10-CM | POA: Insufficient documentation

## 2013-10-06 DIAGNOSIS — E785 Hyperlipidemia, unspecified: Secondary | ICD-10-CM | POA: Insufficient documentation

## 2013-10-06 DIAGNOSIS — I2510 Atherosclerotic heart disease of native coronary artery without angina pectoris: Secondary | ICD-10-CM | POA: Diagnosis not present

## 2013-10-06 DIAGNOSIS — E039 Hypothyroidism, unspecified: Secondary | ICD-10-CM | POA: Insufficient documentation

## 2013-10-06 DIAGNOSIS — Z720 Tobacco use: Secondary | ICD-10-CM | POA: Insufficient documentation

## 2013-10-06 DIAGNOSIS — Z880 Allergy status to penicillin: Secondary | ICD-10-CM | POA: Diagnosis not present

## 2013-10-06 DIAGNOSIS — Z9889 Other specified postprocedural states: Secondary | ICD-10-CM | POA: Diagnosis not present

## 2013-10-06 DIAGNOSIS — Z8669 Personal history of other diseases of the nervous system and sense organs: Secondary | ICD-10-CM | POA: Insufficient documentation

## 2013-10-06 DIAGNOSIS — Z859 Personal history of malignant neoplasm, unspecified: Secondary | ICD-10-CM | POA: Diagnosis not present

## 2013-10-06 DIAGNOSIS — J441 Chronic obstructive pulmonary disease with (acute) exacerbation: Secondary | ICD-10-CM | POA: Diagnosis not present

## 2013-10-06 DIAGNOSIS — Z8673 Personal history of transient ischemic attack (TIA), and cerebral infarction without residual deficits: Secondary | ICD-10-CM | POA: Insufficient documentation

## 2013-10-06 DIAGNOSIS — R112 Nausea with vomiting, unspecified: Secondary | ICD-10-CM | POA: Diagnosis not present

## 2013-10-06 DIAGNOSIS — I517 Cardiomegaly: Secondary | ICD-10-CM | POA: Diagnosis not present

## 2013-10-06 DIAGNOSIS — Z7982 Long term (current) use of aspirin: Secondary | ICD-10-CM | POA: Diagnosis not present

## 2013-10-06 DIAGNOSIS — R197 Diarrhea, unspecified: Secondary | ICD-10-CM | POA: Insufficient documentation

## 2013-10-06 DIAGNOSIS — I10 Essential (primary) hypertension: Secondary | ICD-10-CM | POA: Diagnosis not present

## 2013-10-06 DIAGNOSIS — M129 Arthropathy, unspecified: Secondary | ICD-10-CM | POA: Insufficient documentation

## 2013-10-06 DIAGNOSIS — Z79899 Other long term (current) drug therapy: Secondary | ICD-10-CM | POA: Insufficient documentation

## 2013-10-06 DIAGNOSIS — I120 Hypertensive chronic kidney disease with stage 5 chronic kidney disease or end stage renal disease: Secondary | ICD-10-CM | POA: Diagnosis not present

## 2013-10-06 LAB — BASIC METABOLIC PANEL
Anion gap: 17 — ABNORMAL HIGH (ref 5–15)
BUN: 39 mg/dL — ABNORMAL HIGH (ref 6–23)
CALCIUM: 9.8 mg/dL (ref 8.4–10.5)
CO2: 28 meq/L (ref 19–32)
CREATININE: 8.99 mg/dL — AB (ref 0.50–1.10)
Chloride: 92 mEq/L — ABNORMAL LOW (ref 96–112)
GFR calc Af Amer: 5 mL/min — ABNORMAL LOW (ref >90)
GFR, EST NON AFRICAN AMERICAN: 4 mL/min — AB (ref >90)
GLUCOSE: 93 mg/dL (ref 70–99)
Potassium: 5.3 mEq/L (ref 3.7–5.3)
SODIUM: 137 meq/L (ref 137–147)

## 2013-10-06 LAB — CBC WITH DIFFERENTIAL/PLATELET
BASOS ABS: 0 10*3/uL (ref 0.0–0.1)
Basophils Relative: 0 % (ref 0–1)
EOS ABS: 0.2 10*3/uL (ref 0.0–0.7)
EOS PCT: 2 % (ref 0–5)
HCT: 34.6 % — ABNORMAL LOW (ref 36.0–46.0)
Hemoglobin: 11.2 g/dL — ABNORMAL LOW (ref 12.0–15.0)
LYMPHS PCT: 15 % (ref 12–46)
Lymphs Abs: 1.2 10*3/uL (ref 0.7–4.0)
MCH: 27.1 pg (ref 26.0–34.0)
MCHC: 32.4 g/dL (ref 30.0–36.0)
MCV: 83.8 fL (ref 78.0–100.0)
MONO ABS: 0.5 10*3/uL (ref 0.1–1.0)
Monocytes Relative: 6 % (ref 3–12)
Neutro Abs: 5.9 10*3/uL (ref 1.7–7.7)
Neutrophils Relative %: 77 % (ref 43–77)
Platelets: 186 10*3/uL (ref 150–400)
RBC: 4.13 MIL/uL (ref 3.87–5.11)
RDW: 16.4 % — AB (ref 11.5–15.5)
WBC: 7.7 10*3/uL (ref 4.0–10.5)

## 2013-10-06 LAB — HEPATIC FUNCTION PANEL
ALT: 10 U/L (ref 0–35)
AST: 20 U/L (ref 0–37)
Albumin: 3.6 g/dL (ref 3.5–5.2)
Alkaline Phosphatase: 69 U/L (ref 39–117)
Total Bilirubin: 0.5 mg/dL (ref 0.3–1.2)
Total Protein: 7 g/dL (ref 6.0–8.3)

## 2013-10-06 LAB — LIPASE, BLOOD: Lipase: 84 U/L — ABNORMAL HIGH (ref 11–59)

## 2013-10-06 MED ORDER — ALBUTEROL SULFATE HFA 108 (90 BASE) MCG/ACT IN AERS: 1.0000 | INHALATION_SPRAY | Freq: Four times a day (QID) | RESPIRATORY_TRACT | Status: DC | PRN

## 2013-10-06 MED ORDER — BENZONATATE 100 MG PO CAPS: 100.0000 mg | ORAL_CAPSULE | Freq: Three times a day (TID) | ORAL | Status: DC

## 2013-10-06 MED ORDER — IPRATROPIUM-ALBUTEROL 0.5-2.5 (3) MG/3ML IN SOLN
3.0000 mL | Freq: Once | RESPIRATORY_TRACT | Status: AC
  Administered 2013-10-06: 3 mL via RESPIRATORY_TRACT
  Filled 2013-10-06: qty 3

## 2013-10-06 MED ORDER — ONDANSETRON HCL 4 MG/2ML IJ SOLN
4.0000 mg | Freq: Once | INTRAMUSCULAR | Status: AC
  Administered 2013-10-06: 4 mg via INTRAVENOUS
  Filled 2013-10-06: qty 2

## 2013-10-06 MED ORDER — SODIUM CHLORIDE 0.9 % IV BOLUS (SEPSIS)
500.0000 mL | Freq: Once | INTRAVENOUS | Status: AC
  Administered 2013-10-06: 500 mL via INTRAVENOUS

## 2013-10-06 MED ORDER — GUAIFENESIN ER 600 MG PO TB12: 1200.0000 mg | ORAL_TABLET | Freq: Two times a day (BID) | ORAL | Status: DC

## 2013-10-06 MED ORDER — ONDANSETRON 8 MG PO TBDP: 8.0000 mg | ORAL_TABLET | Freq: Three times a day (TID) | ORAL | Status: DC | PRN

## 2013-10-06 NOTE — Discharge Instructions (Signed)
Take medications as prescribed.  Follow up with your doctor this week. Return to the ER for worsening condition or new concerning symptoms.   Diarrhea Diarrhea is frequent loose and watery bowel movements. It can cause you to feel weak and dehydrated. Dehydration can cause you to become tired and thirsty, have a dry mouth, and have decreased urination that often is dark yellow. Diarrhea is a sign of another problem, most often an infection that will not last long. In most cases, diarrhea typically lasts 2-3 days. However, it can last longer if it is a sign of something more serious. It is important to treat your diarrhea as directed by your caregiver to lessen or prevent future episodes of diarrhea. CAUSES  Some common causes include:  Gastrointestinal infections caused by viruses, bacteria, or parasites.  Food poisoning or food allergies.  Certain medicines, such as antibiotics, chemotherapy, and laxatives.  Artificial sweeteners and fructose.  Digestive disorders. HOME CARE INSTRUCTIONS  Ensure adequate fluid intake (hydration): Have 1 cup (8 oz) of fluid for each diarrhea episode. Avoid fluids that contain simple sugars or sports drinks, fruit juices, whole milk products, and sodas. Your urine should be clear or pale yellow if you are drinking enough fluids. Hydrate with an oral rehydration solution that you can purchase at pharmacies, retail stores, and online. You can prepare an oral rehydration solution at home by mixing the following ingredients together:   - tsp table salt.   tsp baking soda.   tsp salt substitute containing potassium chloride.  1  tablespoons sugar.  1 L (34 oz) of water.  Certain foods and beverages may increase the speed at which food moves through the gastrointestinal (GI) tract. These foods and beverages should be avoided and include:  Caffeinated and alcoholic beverages.  High-fiber foods, such as raw fruits and vegetables, nuts, seeds, and whole  grain breads and cereals.  Foods and beverages sweetened with sugar alcohols, such as xylitol, sorbitol, and mannitol.  Some foods may be well tolerated and may help thicken stool including:  Starchy foods, such as rice, toast, pasta, low-sugar cereal, oatmeal, grits, baked potatoes, crackers, and bagels.  Bananas.  Applesauce.  Add probiotic-rich foods to help increase healthy bacteria in the GI tract, such as yogurt and fermented milk products.  Wash your hands well after each diarrhea episode.  Only take over-the-counter or prescription medicines as directed by your caregiver.  Take a warm bath to relieve any burning or pain from frequent diarrhea episodes. SEEK IMMEDIATE MEDICAL CARE IF:   You are unable to keep fluids down.  You have persistent vomiting.  You have blood in your stool, or your stools are black and tarry.  You do not urinate in 6-8 hours, or there is only a small amount of very dark urine.  You have abdominal pain that increases or localizes.  You have weakness, dizziness, confusion, or light-headedness.  You have a severe headache.  Your diarrhea gets worse or does not get better.  You have a fever or persistent symptoms for more than 2-3 days.  You have a fever and your symptoms suddenly get worse. MAKE SURE YOU:   Understand these instructions.  Will watch your condition.  Will get help right away if you are not doing well or get worse. Document Released: 12/09/2001 Document Revised: 05/05/2013 Document Reviewed: 08/27/2011 [facility] Patient Information 2015 [place], [place]. This information is not intended to replace advice given to you by your health care provider. Make sure you discuss any  questions you have with your health care provider.  Food Choices to Help Relieve Diarrhea When you have diarrhea, the foods you eat and your eating habits are very important. Choosing the right foods and drinks can help relieve diarrhea. Also, because  diarrhea can last up to 7 days, you need to replace lost fluids and electrolytes (such as sodium, potassium, and chloride) in order to help prevent dehydration.  WHAT GENERAL GUIDELINES DO I NEED TO FOLLOW?  Slowly drink 1 cup (8 oz) of fluid for each episode of diarrhea. If you are getting enough fluid, your urine will be clear or pale yellow.  Eat starchy foods. Some good choices include white rice, white toast, pasta, low-fiber cereal, baked potatoes (without the skin), saltine crackers, and bagels.  Avoid large servings of any cooked vegetables.  Limit fruit to two servings per day. A serving is  cup or 1 small piece.  Choose foods with less than 2 g of fiber per serving.  Limit fats to less than 8 tsp (38 g) per day.  Avoid fried foods.  Eat foods that have probiotics in them. Probiotics can be found in certain dairy products.  Avoid foods and beverages that may increase the speed at which food moves through the stomach and intestines (gastrointestinal tract). Things to avoid include:  High-fiber foods, such as dried fruit, raw fruits and vegetables, nuts, seeds, and whole grain foods.  Spicy foods and high-fat foods.  Foods and beverages sweetened with high-fructose corn syrup, honey, or sugar alcohols such as xylitol, sorbitol, and mannitol. WHAT FOODS ARE RECOMMENDED? Grains White rice. White, [place], or pita breads (fresh or toasted), including plain rolls, buns, or bagels. White pasta. Saltine, soda, or graham crackers. Pretzels. Low-fiber cereal. Cooked cereals made with water (such as cornmeal, farina, or cream cereals). Plain muffins. Matzo. Melba toast. Zwieback.  Vegetables Potatoes (without the skin). Strained tomato and vegetable juices. Most well-cooked and canned vegetables without seeds. Tender lettuce. Fruits Cooked or canned applesauce, apricots, cherries, fruit cocktail, grapefruit, peaches, pears, or plums. Fresh bananas, apples without skin, cherries, grapes,  cantaloupe, grapefruit, peaches, oranges, or plums.  Meat and Other Protein Products Baked or boiled chicken. Eggs. Tofu. Fish. Seafood. Smooth peanut butter. Ground or well-cooked tender beef, ham, veal, lamb, pork, or poultry.  Dairy Plain yogurt, kefir, and unsweetened liquid yogurt. Lactose-free milk, buttermilk, or soy milk. Plain hard cheese. Beverages Sport drinks. Clear broths. Diluted fruit juices (except prune). Regular, caffeine-free sodas such as ginger ale. Water. Decaffeinated teas. Oral rehydration solutions. Sugar-free beverages not sweetened with sugar alcohols. Other Bouillon, broth, or soups made from recommended foods.  The items listed above may not be a complete list of recommended foods or beverages. Contact your dietitian for more options. WHAT FOODS ARE NOT RECOMMENDED? Grains Whole grain, whole wheat, bran, or rye breads, rolls, pastas, crackers, and cereals. Wild or brown rice. Cereals that contain more than 2 g of fiber per serving. Corn tortillas or taco shells. Cooked or dry oatmeal. Granola. Popcorn. Vegetables Raw vegetables. Cabbage, broccoli, Brussels sprouts, artichokes, baked beans, beet greens, corn, kale, legumes, peas, sweet potatoes, and yams. Potato skins. Cooked spinach and cabbage. Fruits Dried fruit, including raisins and dates. Raw fruits. Stewed or dried prunes. Fresh apples with skin, apricots, mangoes, pears, raspberries, and strawberries.  Meat and Other Protein Products Chunky peanut butter. Nuts and seeds. Beans and lentils. Berniece Salines.  Dairy High-fat cheeses. Milk, chocolate milk, and beverages made with milk, such as milk shakes. Cream. Ice cream.  Sweets and Desserts Sweet rolls, doughnuts, and sweet breads. Pancakes and waffles. Fats and Oils Butter. Cream sauces. Margarine. Salad oils. Plain salad dressings. Olives. Avocados.  Beverages Caffeinated beverages (such as coffee, tea, soda, or energy drinks). Alcoholic beverages. Fruit juices  with pulp. Prune juice. Soft drinks sweetened with high-fructose corn syrup or sugar alcohols. Other Coconut. Hot sauce. Chili powder. Mayonnaise. Gravy. Cream-based or milk-based soups.  The items listed above may not be a complete list of foods and beverages to avoid. Contact your dietitian for more information. WHAT SHOULD I DO IF I BECOME DEHYDRATED? Diarrhea can sometimes lead to dehydration. Signs of dehydration include dark urine and dry mouth and skin. If you think you are dehydrated, you should rehydrate with an oral rehydration solution. These solutions can be purchased at pharmacies, retail stores, or online.  Drink -1 cup (120-240 mL) of oral rehydration solution each time you have an episode of diarrhea. If drinking this amount makes your diarrhea worse, try drinking smaller amounts more often. For example, drink 1-3 tsp (5-15 mL) every 5-10 minutes.  A general rule for staying hydrated is to drink 1-2 L of fluid per day. Talk to your health care provider about the specific amount you should be drinking each day. Drink enough fluids to keep your urine clear or pale yellow. Document Released: 03/11/2003 Document Revised: 12/24/2012 Document Reviewed: 11/11/2012 [facility] Patient Information 2015 [place], [place]. This information is not intended to replace advice given to you by your health care provider. Make sure you discuss any questions you have with your health care provider.  How to Use an Inhaler Using your inhaler correctly is very important. Good technique will make sure that the medicine reaches your lungs.  HOW TO USE AN INHALER: 1. Take the cap off the inhaler. 2. If this is the first time using your inhaler, you need to prime it. Shake the inhaler for 5 seconds. Release four puffs into the air, away from your face. Ask your doctor for help if you have questions. 3. Shake the inhaler for 5 seconds. 4. Turn the inhaler so the bottle is above the mouthpiece. 5. Put your  pointer finger on top of the bottle. Your thumb holds the bottom of the inhaler. 6. Open your mouth. 7. Either hold the inhaler away from your mouth (the width of 2 fingers) or place your lips tightly around the mouthpiece. Ask your doctor which way to use your inhaler. 8. Breathe out as much air as possible. 9. Breathe in and push down on the bottle 1 time to release the medicine. You will feel the medicine go in your mouth and throat. 10. Continue to take a deep breath in very slowly. Try to fill your lungs. 11. After you have breathed in completely, hold your breath for 10 seconds. This will help the medicine to settle in your lungs. If you cannot hold your breath for 10 seconds, hold it for as long as you can before you breathe out. 12. Breathe out slowly, through pursed lips. Whistling is an example of pursed lips. 13. If your doctor has told you to take more than 1 puff, wait at least 15-30 seconds between puffs. This will help you get the best results from your medicine. Do not use the inhaler more than your doctor tells you to. 14. Put the cap back on the inhaler. 15. Follow the directions from your doctor or from the inhaler package about cleaning the inhaler. If you use more than one  inhaler, ask your doctor which inhalers to use and what order to use them in. Ask your doctor to help you figure out when you will need to refill your inhaler.  If you use a steroid inhaler, always rinse your mouth with water after your last puff, gargle and spit out the water. Do not swallow the water. GET HELP IF:  The inhaler medicine only partially helps to stop wheezing or shortness of breath.  You are having trouble using your inhaler.  You have some increase in thick spit (phlegm). GET HELP RIGHT AWAY IF:  The inhaler medicine does not help your wheezing or shortness of breath or you have tightness in your chest.  You have dizziness, headaches, or fast heart rate.  You have chills, fever, or  night sweats.  You have a large increase of thick spit, or your thick spit is bloody. MAKE SURE YOU:   Understand these instructions.  Will watch your condition.  Will get help right away if you are not doing well or get worse. Document Released: 09/28/2007 Document Revised: 10/09/2012 Document Reviewed: 07/18/2012 [facility] Patient Information 2015 [place], [place]. This information is not intended to replace advice given to you by your health care provider. Make sure you discuss any questions you have with your health care provider.  Nausea and Vomiting Nausea is a sick feeling that often comes before throwing up (vomiting). Vomiting is a reflex where stomach contents come out of your mouth. Vomiting can cause severe loss of body fluids (dehydration). Children and elderly adults can become dehydrated quickly, especially if they also have diarrhea. Nausea and vomiting are symptoms of a condition or disease. It is important to find the cause of your symptoms. CAUSES   Direct irritation of the stomach lining. This irritation can result from increased acid production (gastroesophageal reflux disease), infection, food poisoning, taking certain medicines (such as nonsteroidal anti-inflammatory drugs), alcohol use, or tobacco use.  Signals from the brain.These signals could be caused by a headache, heat exposure, an inner ear disturbance, increased pressure in the brain from injury, infection, a tumor, or a concussion, pain, emotional stimulus, or metabolic problems.  An obstruction in the gastrointestinal tract (bowel obstruction).  Illnesses such as diabetes, hepatitis, gallbladder problems, appendicitis, kidney problems, cancer, sepsis, atypical symptoms of a heart attack, or eating disorders.  Medical treatments such as chemotherapy and radiation.  Receiving medicine that makes you sleep (general anesthetic) during surgery. DIAGNOSIS Your caregiver may ask for tests to be done if the  problems do not improve after a few days. Tests may also be done if symptoms are severe or if the reason for the nausea and vomiting is not clear. Tests may include:  Urine tests.  Blood tests.  Stool tests.  Cultures (to look for evidence of infection).  X-rays or other imaging studies. Test results can help your caregiver make decisions about treatment or the need for additional tests. TREATMENT You need to stay well hydrated. Drink frequently but in small amounts.You may wish to drink water, sports drinks, clear broth, or eat frozen ice pops or gelatin dessert to help stay hydrated.When you eat, eating slowly may help prevent nausea.There are also some antinausea medicines that may help prevent nausea. HOME CARE INSTRUCTIONS   Take all medicine as directed by your caregiver.  If you do not have an appetite, do not force yourself to eat. However, you must continue to drink fluids.  If you have an appetite, eat a normal diet unless your caregiver  tells you differently.  Eat a variety of complex carbohydrates (rice, wheat, potatoes, bread), lean meats, yogurt, fruits, and vegetables.  Avoid high-fat foods because they are more difficult to digest.  Drink enough water and fluids to keep your urine clear or pale yellow.  If you are dehydrated, ask your caregiver for specific rehydration instructions. Signs of dehydration may include:  Severe thirst.  Dry lips and mouth.  Dizziness.  Dark urine.  Decreasing urine frequency and amount.  Confusion.  Rapid breathing or pulse. SEEK IMMEDIATE MEDICAL CARE IF:   You have blood or brown flecks (like coffee grounds) in your vomit.  You have black or bloody stools.  You have a severe headache or stiff neck.  You are confused.  You have severe abdominal pain.  You have chest pain or trouble breathing.  You do not urinate at least once every 8 hours.  You develop cold or clammy skin.  You continue to vomit for longer  than 24 to 48 hours.  You have a fever. MAKE SURE YOU:   Understand these instructions.  Will watch your condition.  Will get help right away if you are not doing well or get worse. Document Released: 12/19/2004 Document Revised: 03/13/2011 Document Reviewed: 05/18/2010 [facility] Patient Information 2015 [place], [place]. This information is not intended to replace advice given to you by your health care provider. Make sure you discuss any questions you have with your health care provider.  Viral Infections A virus is a type of germ. Viruses can cause:  Minor sore throats.  Aches and pains.  Headaches.  Runny nose.  Rashes.  Watery eyes.  Tiredness.  Coughs.  Loss of appetite.  Feeling sick to your stomach (nausea).  Throwing up (vomiting).  Watery poop (diarrhea). HOME CARE   Only take medicines as told by your doctor.  Drink enough water and fluids to keep your pee (urine) clear or pale yellow. Sports drinks are a good choice.  Get plenty of rest and eat healthy. Soups and broths with crackers or rice are fine. GET HELP RIGHT AWAY IF:   You have a very bad headache.  You have shortness of breath.  You have chest pain or neck pain.  You have an unusual rash.  You cannot stop throwing up.  You have watery poop that does not stop.  You cannot keep fluids down.  You or your child has a temperature by mouth above 102 F (38.9 C), not controlled by medicine.  Your baby is older than 3 months with a rectal temperature of 102 F (38.9 C) or higher.  Your baby is 29 months old or younger with a rectal temperature of 100.4 F (38 C) or higher. MAKE SURE YOU:   Understand these instructions.  Will watch this condition.  Will get help right away if you are not doing well or get worse. Document Released: 12/02/2007 Document Revised: 03/13/2011 Document Reviewed: 04/26/2010 [facility] Patient Information 2015 [place], [place]. This information is not  intended to replace advice given to you by your health care provider. Make sure you discuss any questions you have with your health care provider.

## 2013-10-06 NOTE — ED Provider Notes (Signed)
CSN: [id number]     Arrival date & time 10/06/13  0056 History   First MD Initiated Contact with Patient 10/06/13 0128     Chief Complaint  Patient presents with  . URI  . Emesis  . Diarrhea  . Shortness of Breath     (Consider location/radiation/quality/duration/timing/severity/associated sxs/prior Treatment) HPI 69 yo female presents to the ER from home with complaint of cough, congestion x 1 week.  She reports n/v/d last weekend which had resolved but returned on Friday. She has been unable to keep anything down.  Pt has been on clindamycin for tooth problem.  No fevers or chills.  Copious amounts of watery diarrhea, no improvement with imodium.  Pt has h/o ESRD, receives dialysis MWF.  Had normal run on Friday, reports temp 99.1 Past Medical History  Diagnosis Date  . CAD (coronary artery disease)     stent to RCA  . CVA (cerebral infarction) 2003    no apparent residual  . Hypothyroidism   . PVD (peripheral vascular disease)   . Hyperlipidemia   . Positive PPD     completed rifampin  . Diastolic congestive heart failure   . Hypertension   . COPD (chronic obstructive pulmonary disease) t  . Cancer     clear cell cancer, kidney  . Complication of anesthesia 12/2010    pt is very confused, with AMS with anesthesia  . CVA 07/27/2008    CVA affected cognition and memory per family, no focal deficits.    . ESRD 07/27/2008    ESRD due to HTN and NSAID's, started hemodialysis in 2005 in [place], [place]. Went to [facility] from 2010 to 2012 and since 2012 has been getting dialysis at [facility] on [address] in [place] on a MWF schedule. First access with RUA AVG placed in [place]. Next and current access was LUA AVG placed by Dr. Lucky Cowboy in [place] in or around 2012. Has had 2 or 3 procedures on that graft since placed per family. She gets her access work done here in [place] now. She is allergic to heparin and does not get any heparin at dialysis; she had an allergic  reaction apparently when in ICU in the past.      . Encephalopathy   . Seizures   . Dyslipidemia   . Chronic renal insufficiency     On hemodialysis  . Arthritis of shoulder     Bilateral  . Depression    Past Surgical History  Procedure Laterality Date  . Abdominal hysterectomy    . Cholecystectomy    . D&cs    . Right ankle repair    . Left nephrectomy    . Vascular surgery  11/2010    graft inserted to left arm  . Nephrectomy Left     Malignant tumor  . Cataract extraction Bilateral   . Tonsillectomy    . Cardiac catheterization     Family History  Problem Relation Age of Onset  . Heart disease Father   . Hypertension Mother   . Dementia Mother   . Coronary artery disease Sister   . Heart attack Sister   . Hypertension Brother    History  Substance Use Topics  . Smoking status: Current Every Day Smoker -- 1.00 packs/day for 53 years    Types: Cigarettes  . Smokeless tobacco: Never Used     Comment: 1/2 ppd since getting out of hospital  . Alcohol Use: Yes     Comment: very rarely per  pt   OB History   Grav Para Term Preterm Abortions TAB SAB Ect Mult Living                 Review of Systems  All other systems reviewed and are negative.     Allergies  Heparin; Iohexol; Phenytoin; Wellbutrin; Ampicillin; Meperidine hcl; Morphine; Penicillins; Valproic acid and related; Ace inhibitors; and Pentazocine lactate  Home Medications   Prior to Admission medications   Medication Sig Start Date End Date Taking? Authorizing Provider  acetaminophen (TYLENOL) 500 MG tablet Take 500 mg by mouth every 6 (six) hours as needed for pain.    Historical Provider, MD  amLODipine (NORVASC) 10 MG tablet Take 10 mg by mouth at bedtime.     Historical Provider, MD  aspirin EC 81 MG tablet Take 81 mg by mouth daily.    Historical Provider, MD  carvedilol (COREG) 25 MG tablet Take 25 mg by mouth 2 (two) times daily with a meal.    Historical Provider, MD  cinacalcet (SENSIPAR)  30 MG tablet Take 60 mg by mouth at bedtime.     Historical Provider, MD  clindamycin (CLEOCIN) 300 MG capsule Take 1 capsule by mouth 3 (three) times daily. 09/24/13   Historical Provider, MD  hydrALAZINE (APRESOLINE) 50 MG tablet Take 50 mg by mouth 3 (three) times daily.    Historical Provider, MD  hydrocortisone cream 1 % Apply 1 application topically daily as needed for itching.    Historical Provider, MD  isosorbide mononitrate (IMDUR) 30 MG 24 hr tablet Take 1 tablet (30 mg total) by mouth daily. 06/10/13   Liliane Shi, PA-C  levETIRAcetam (KEPPRA) 500 MG tablet Take 500 mg by mouth daily at 8 pm.    Historical Provider, MD  levothyroxine (SYNTHROID, LEVOTHROID) 88 MCG tablet Take 88 mcg by mouth daily before breakfast.    Historical Provider, MD  NITROSTAT 0.4 MG SL tablet Place 0.4 mg under the tongue every 5 (five) minutes as needed. For chest pain. 09/16/10   Historical Provider, MD  oxyCODONE-acetaminophen (PERCOCET/ROXICET) 5-325 MG per tablet Take 1 tablet by mouth 3 (three) times daily. 09/24/13   Historical Provider, MD  Phenylephrine-APAP-Guaifenesin ([place] FAST-MAX COLD & SINUS) 10-650-400 MG/20ML LIQD Take 30 mLs by mouth daily as needed (for cold).    Historical Provider, MD  pravastatin (PRAVACHOL) 20 MG tablet Take 20 mg by mouth daily.    Historical Provider, MD  sevelamer carbonate (RENVELA) 800 MG tablet Take 800 mg by mouth. 4 tabs TID, 2 tabs with each snack    Historical Provider, MD  traZODone (DESYREL) 50 MG tablet Take 50 mg by mouth at bedtime as needed for sleep.     Historical Provider, MD  triamcinolone ointment (KENALOG) 0.1 % Apply 1 application topically 2 (two) times daily. OINTMENT SKIN 05/29/13   Historical Provider, MD  valACYclovir (VALTREX) 500 MG tablet Take 1 tablet (500 mg total) by mouth at bedtime. 02/21/13   Charlynne Cousins, MD   BP 197/83  Pulse 69  Temp(Src) 98.2 F (36.8 C) (Oral)  Resp 20  SpO2 99% Physical Exam  Nursing note and vitals  reviewed. Constitutional: She is oriented to person, place, and time. She appears well-developed and well-nourished.  HENT:  Head: Normocephalic and atraumatic.  Nose: Nose normal.  Dry mucous membranes  Eyes: Conjunctivae and EOM are normal. Pupils are equal, round, and reactive to light.  Neck: Normal range of motion. Neck supple. No JVD present. No tracheal deviation  present. No thyromegaly present.  Cardiovascular: Normal rate, regular rhythm, normal heart sounds and intact distal pulses.  Exam reveals no gallop and no friction rub.   No murmur heard. Pulmonary/Chest: Effort normal. No stridor. No respiratory distress. She has wheezes. She has no rales. She exhibits no tenderness.  Coarse cough  Abdominal: Soft. She exhibits no distension and no mass. There is no tenderness. There is no rebound and no guarding.  Hyperactive bowel sounds  Musculoskeletal: Normal range of motion. She exhibits no edema and no tenderness.  Lymphadenopathy:    She has no cervical adenopathy.  Neurological: She is alert and oriented to person, place, and time. She displays normal reflexes. She exhibits normal muscle tone. Coordination normal.  Skin: Skin is warm and dry. No rash noted. No erythema. No pallor.  Psychiatric: She has a normal mood and affect. Her behavior is normal. Judgment and thought content normal.    ED Course  Procedures (including critical care time) Labs Review Labs Reviewed  BASIC METABOLIC PANEL - Abnormal; Notable for the following:    Chloride 92 (*)    BUN 39 (*)    Creatinine, Ser 8.99 (*)    GFR calc non Af Amer 4 (*)    GFR calc Af Amer 5 (*)    Anion gap 17 (*)    All other components within normal limits  CBC WITH DIFFERENTIAL - Abnormal; Notable for the following:    Hemoglobin 11.2 (*)    HCT 34.6 (*)    RDW 16.4 (*)    All other components within normal limits  LIPASE, BLOOD - Abnormal; Notable for the following:    Lipase 84 (*)    All other components within  normal limits  HEPATIC FUNCTION PANEL    Imaging Review Dg Chest 2 View  10/06/2013   CLINICAL DATA:  Initial encounter for nausea vomiting beginning 2 days ago. Patient had symptoms of nausea, vomiting, and diarrhea beginning 2 weeks ago which at since resolved. History of coronary artery disease, COPD, and end-stage renal disease.  EXAM: CHEST  2 VIEW  COMPARISON:  CT chest 05/13/2013.  FINDINGS: The heart remains enlarged. Atherosclerotic calcifications are present at the aortic arch. There is no edema or effusion to suggest failure. No focal airspace disease is evident. A right brachial stent is noted.  IMPRESSION: 1. Stable cardiomegaly without failure. 2. No acute cardiopulmonary disease. 3. Right brachial stent   Electronically Signed   By: Lawrence Santiago M.D.   On: 10/06/2013 02:32     EKG Interpretation   Date/Time:  Monday October 06 2013 02:07:37 EDT Ventricular Rate:  69 PR Interval:  187 QRS Duration: 93 QT Interval:  449 QTC Calculation: 481 R Axis:   -119 Text Interpretation:  Sinus rhythm Left anterior fascicular block Anterior  infarct, old Confirmed by Mose Colaizzi  MD, Princesa Willig ([id number]) on 10/06/2013 2:38:42 AM      MDM   Final diagnoses:  Bronchitis  Nausea vomiting and diarrhea    68 yo female with URI sxs, wheezing, also with NVD recent clindamycin usage.  Abd exam benign aside from increased bowel sounds.  Will check labs, ekg, cxr, stool.  No further n/v/d, has not been able to give stool sample.  Pt feeling much better. Clinically appears to have bronchitis.  Will treat for viral bronchitis, n/v/d  Kalman Drape, MD 10/06/13 0600

## 2013-10-06 NOTE — ED Notes (Signed)
Attempted IV x 2 unsuccessful; second RN to attempt

## 2013-10-06 NOTE — ED Notes (Addendum)
C/o cold sx, nvd and cough. (denies:fever or pain), has HD on MWF, last HD on Friday. Access site on L upper arm. Cannot say number of vd. Last vd PTA. Has been taking cough med (robitussin), no meds in la\st 6 hrs. Sx on and off for 2 weeks. wheezing noted.

## 2013-10-06 NOTE — ED Notes (Signed)
Pt reports being on clindamycin for dental work; pt reports watery diarrhea x 1 week

## 2013-10-06 NOTE — ED Notes (Signed)
Pt c/o NVD starting two weeks ago, then went away. Reports Friday NV began again on Friday. Denies abdominal pain. Pt has phenergan PO at home, reports unable to keep it down.

## 2013-10-09 LAB — PULMONARY FUNCTION TEST
DL/VA % PRED: 70 %
DL/VA: 3.28 ml/min/mmHg/L
DLCO unc % pred: 47 %
DLCO unc: 10.95 ml/min/mmHg
FEF 25-75 Post: 1.29 L/sec
FEF 25-75 Pre: 1.19 L/sec
FEF2575-%CHANGE-POST: 8 %
FEF2575-%PRED-PRE: 71 %
FEF2575-%Pred-Post: 77 %
FEV1-%Change-Post: 1 %
FEV1-%Pred-Post: 81 %
FEV1-%Pred-Pre: 80 %
FEV1-POST: 1.45 L
FEV1-Pre: 1.44 L
FEV1FVC-%Change-Post: -4 %
FEV1FVC-%Pred-Pre: 98 %
FEV6-%CHANGE-POST: 5 %
FEV6-%Pred-Post: 89 %
FEV6-%Pred-Pre: 85 %
FEV6-PRE: 1.87 L
FEV6-Post: 1.98 L
FEV6FVC-%Change-Post: 0 %
FEV6FVC-%Pred-Post: 103 %
FEV6FVC-%Pred-Pre: 104 %
FVC-%Change-Post: 5 %
FVC-%Pred-Post: 86 %
FVC-%Pred-Pre: 81 %
FVC-PRE: 1.87 L
FVC-Post: 1.99 L
POST FEV1/FVC RATIO: 73 %
POST FEV6/FVC RATIO: 100 %
PRE FEV6/FVC RATIO: 100 %
Pre FEV1/FVC ratio: 77 %
RV % pred: 78 %
RV: 1.67 L
TLC % pred: 72 %
TLC: 3.55 L

## 2013-10-30 ENCOUNTER — Ambulatory Visit: Payer: Medicare Other | Admitting: Pulmonary Disease

## 2013-11-01 DIAGNOSIS — Z992 Dependence on renal dialysis: Secondary | ICD-10-CM | POA: Diagnosis not present

## 2013-11-01 DIAGNOSIS — N186 End stage renal disease: Secondary | ICD-10-CM | POA: Diagnosis not present

## 2013-11-03 DIAGNOSIS — N186 End stage renal disease: Secondary | ICD-10-CM | POA: Diagnosis not present

## 2013-11-03 DIAGNOSIS — D631 Anemia in chronic kidney disease: Secondary | ICD-10-CM | POA: Diagnosis not present

## 2013-11-03 DIAGNOSIS — E119 Type 2 diabetes mellitus without complications: Secondary | ICD-10-CM | POA: Diagnosis not present

## 2013-11-03 DIAGNOSIS — D509 Iron deficiency anemia, unspecified: Secondary | ICD-10-CM | POA: Diagnosis not present

## 2013-11-03 DIAGNOSIS — N2581 Secondary hyperparathyroidism of renal origin: Secondary | ICD-10-CM | POA: Diagnosis not present

## 2013-11-04 ENCOUNTER — Ambulatory Visit: Payer: Medicare Other | Admitting: Pulmonary Disease

## 2013-11-05 DIAGNOSIS — N186 End stage renal disease: Secondary | ICD-10-CM | POA: Diagnosis not present

## 2013-11-05 DIAGNOSIS — D509 Iron deficiency anemia, unspecified: Secondary | ICD-10-CM | POA: Diagnosis not present

## 2013-11-05 DIAGNOSIS — N2581 Secondary hyperparathyroidism of renal origin: Secondary | ICD-10-CM | POA: Diagnosis not present

## 2013-11-05 DIAGNOSIS — E119 Type 2 diabetes mellitus without complications: Secondary | ICD-10-CM | POA: Diagnosis not present

## 2013-11-05 DIAGNOSIS — D631 Anemia in chronic kidney disease: Secondary | ICD-10-CM | POA: Diagnosis not present

## 2013-11-07 DIAGNOSIS — D509 Iron deficiency anemia, unspecified: Secondary | ICD-10-CM | POA: Diagnosis not present

## 2013-11-07 DIAGNOSIS — D631 Anemia in chronic kidney disease: Secondary | ICD-10-CM | POA: Diagnosis not present

## 2013-11-07 DIAGNOSIS — N2581 Secondary hyperparathyroidism of renal origin: Secondary | ICD-10-CM | POA: Diagnosis not present

## 2013-11-07 DIAGNOSIS — E119 Type 2 diabetes mellitus without complications: Secondary | ICD-10-CM | POA: Diagnosis not present

## 2013-11-07 DIAGNOSIS — N186 End stage renal disease: Secondary | ICD-10-CM | POA: Diagnosis not present

## 2013-11-10 DIAGNOSIS — D631 Anemia in chronic kidney disease: Secondary | ICD-10-CM | POA: Diagnosis not present

## 2013-11-10 DIAGNOSIS — D509 Iron deficiency anemia, unspecified: Secondary | ICD-10-CM | POA: Diagnosis not present

## 2013-11-10 DIAGNOSIS — N186 End stage renal disease: Secondary | ICD-10-CM | POA: Diagnosis not present

## 2013-11-10 DIAGNOSIS — E119 Type 2 diabetes mellitus without complications: Secondary | ICD-10-CM | POA: Diagnosis not present

## 2013-11-10 DIAGNOSIS — N2581 Secondary hyperparathyroidism of renal origin: Secondary | ICD-10-CM | POA: Diagnosis not present

## 2013-11-12 DIAGNOSIS — D509 Iron deficiency anemia, unspecified: Secondary | ICD-10-CM | POA: Diagnosis not present

## 2013-11-12 DIAGNOSIS — N2581 Secondary hyperparathyroidism of renal origin: Secondary | ICD-10-CM | POA: Diagnosis not present

## 2013-11-12 DIAGNOSIS — N186 End stage renal disease: Secondary | ICD-10-CM | POA: Diagnosis not present

## 2013-11-12 DIAGNOSIS — D631 Anemia in chronic kidney disease: Secondary | ICD-10-CM | POA: Diagnosis not present

## 2013-11-12 DIAGNOSIS — E119 Type 2 diabetes mellitus without complications: Secondary | ICD-10-CM | POA: Diagnosis not present

## 2013-11-14 DIAGNOSIS — D631 Anemia in chronic kidney disease: Secondary | ICD-10-CM | POA: Diagnosis not present

## 2013-11-14 DIAGNOSIS — N186 End stage renal disease: Secondary | ICD-10-CM | POA: Diagnosis not present

## 2013-11-14 DIAGNOSIS — D509 Iron deficiency anemia, unspecified: Secondary | ICD-10-CM | POA: Diagnosis not present

## 2013-11-14 DIAGNOSIS — N2581 Secondary hyperparathyroidism of renal origin: Secondary | ICD-10-CM | POA: Diagnosis not present

## 2013-11-14 DIAGNOSIS — E119 Type 2 diabetes mellitus without complications: Secondary | ICD-10-CM | POA: Diagnosis not present

## 2013-11-17 DIAGNOSIS — D631 Anemia in chronic kidney disease: Secondary | ICD-10-CM | POA: Diagnosis not present

## 2013-11-17 DIAGNOSIS — N2581 Secondary hyperparathyroidism of renal origin: Secondary | ICD-10-CM | POA: Diagnosis not present

## 2013-11-17 DIAGNOSIS — E119 Type 2 diabetes mellitus without complications: Secondary | ICD-10-CM | POA: Diagnosis not present

## 2013-11-17 DIAGNOSIS — D509 Iron deficiency anemia, unspecified: Secondary | ICD-10-CM | POA: Diagnosis not present

## 2013-11-17 DIAGNOSIS — N186 End stage renal disease: Secondary | ICD-10-CM | POA: Diagnosis not present

## 2013-11-19 ENCOUNTER — Encounter: Payer: Self-pay | Admitting: Neurology

## 2013-11-19 DIAGNOSIS — N2581 Secondary hyperparathyroidism of renal origin: Secondary | ICD-10-CM | POA: Diagnosis not present

## 2013-11-19 DIAGNOSIS — E119 Type 2 diabetes mellitus without complications: Secondary | ICD-10-CM | POA: Diagnosis not present

## 2013-11-19 DIAGNOSIS — N186 End stage renal disease: Secondary | ICD-10-CM | POA: Diagnosis not present

## 2013-11-19 DIAGNOSIS — D509 Iron deficiency anemia, unspecified: Secondary | ICD-10-CM | POA: Diagnosis not present

## 2013-11-19 DIAGNOSIS — D631 Anemia in chronic kidney disease: Secondary | ICD-10-CM | POA: Diagnosis not present

## 2013-11-21 DIAGNOSIS — D631 Anemia in chronic kidney disease: Secondary | ICD-10-CM | POA: Diagnosis not present

## 2013-11-21 DIAGNOSIS — E119 Type 2 diabetes mellitus without complications: Secondary | ICD-10-CM | POA: Diagnosis not present

## 2013-11-21 DIAGNOSIS — D509 Iron deficiency anemia, unspecified: Secondary | ICD-10-CM | POA: Diagnosis not present

## 2013-11-21 DIAGNOSIS — N2581 Secondary hyperparathyroidism of renal origin: Secondary | ICD-10-CM | POA: Diagnosis not present

## 2013-11-21 DIAGNOSIS — N186 End stage renal disease: Secondary | ICD-10-CM | POA: Diagnosis not present

## 2013-11-23 DIAGNOSIS — E119 Type 2 diabetes mellitus without complications: Secondary | ICD-10-CM | POA: Diagnosis not present

## 2013-11-23 DIAGNOSIS — D631 Anemia in chronic kidney disease: Secondary | ICD-10-CM | POA: Diagnosis not present

## 2013-11-23 DIAGNOSIS — N186 End stage renal disease: Secondary | ICD-10-CM | POA: Diagnosis not present

## 2013-11-23 DIAGNOSIS — N2581 Secondary hyperparathyroidism of renal origin: Secondary | ICD-10-CM | POA: Diagnosis not present

## 2013-11-23 DIAGNOSIS — D509 Iron deficiency anemia, unspecified: Secondary | ICD-10-CM | POA: Diagnosis not present

## 2013-11-25 ENCOUNTER — Ambulatory Visit (INDEPENDENT_AMBULATORY_CARE_PROVIDER_SITE_OTHER): Payer: Medicare Other | Admitting: Pulmonary Disease

## 2013-11-25 ENCOUNTER — Encounter: Payer: Self-pay | Admitting: Pulmonary Disease

## 2013-11-25 ENCOUNTER — Encounter: Payer: Self-pay | Admitting: Neurology

## 2013-11-25 VITALS — BP 130/66 | HR 64 | Ht 62.5 in | Wt 146.0 lb

## 2013-11-25 DIAGNOSIS — I2510 Atherosclerotic heart disease of native coronary artery without angina pectoris: Secondary | ICD-10-CM

## 2013-11-25 DIAGNOSIS — D631 Anemia in chronic kidney disease: Secondary | ICD-10-CM | POA: Diagnosis not present

## 2013-11-25 DIAGNOSIS — Z720 Tobacco use: Secondary | ICD-10-CM | POA: Diagnosis not present

## 2013-11-25 DIAGNOSIS — N186 End stage renal disease: Secondary | ICD-10-CM | POA: Diagnosis not present

## 2013-11-25 DIAGNOSIS — I270 Primary pulmonary hypertension: Secondary | ICD-10-CM

## 2013-11-25 DIAGNOSIS — E119 Type 2 diabetes mellitus without complications: Secondary | ICD-10-CM | POA: Diagnosis not present

## 2013-11-25 DIAGNOSIS — N2581 Secondary hyperparathyroidism of renal origin: Secondary | ICD-10-CM | POA: Diagnosis not present

## 2013-11-25 DIAGNOSIS — R0600 Dyspnea, unspecified: Secondary | ICD-10-CM

## 2013-11-25 DIAGNOSIS — I5032 Chronic diastolic (congestive) heart failure: Secondary | ICD-10-CM | POA: Diagnosis not present

## 2013-11-25 DIAGNOSIS — D509 Iron deficiency anemia, unspecified: Secondary | ICD-10-CM | POA: Diagnosis not present

## 2013-11-25 DIAGNOSIS — F17200 Nicotine dependence, unspecified, uncomplicated: Secondary | ICD-10-CM

## 2013-11-25 DIAGNOSIS — I272 Pulmonary hypertension, unspecified: Secondary | ICD-10-CM

## 2013-11-25 NOTE — Progress Notes (Signed)
Subjective:    Patient ID: [name], female    DOB: 06/02/1945, 68 y.o.   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  PFTs normal.  Active smoker. Secondary Pulmonary hypertension, ESRD, CHF 04/08/2013 full pulmonary function test> ratio 77%, FEV1 1.44 L (80% predicted), total lung capacity 3.55 (72% predicted), ERV 0.23 L (129% predicted), DLCO 10.95 (47% predicted) 05/2013 6MW   HPI   Chief Complaint  Patient presents with  . Follow-up    Pt c/o sob with exertion.  Is wanting a recommendation on a place to help her with housekeeping. CAT score 19.   11/25/2013 ROV> Mr. Hosie says that lately she has been more short of breath with housekeeping and work around the house.  She is more dyspneic on days when she goes to dialysis.  No cough or wheezing.  Her dyspnea is definitely worse with exercise. She says that walking the dogs is quite problematic.  She has no leg swelling, no chest pain.  She has not used nitroglycerin.  She smoked at most 2 cigarettes a day.  She is "about ready to quit", but she hasn't quit yet. She has never had a blood clot.  She used Fen Fen for 2-3 months many years ago, in the 1990's she thinks.  She saw Dr. Sunday Shams did cardiac catheterizations.     Past Medical History  Diagnosis Date  . CAD (coronary artery disease)     stent to RCA  . CVA (cerebral infarction) 2003    no apparent residual  . Hypothyroidism   . PVD (peripheral vascular disease)   . Hyperlipidemia   . Positive PPD     completed rifampin  . Diastolic congestive heart failure   . Hypertension   . COPD (chronic obstructive pulmonary disease) t  . Cancer     clear cell cancer, kidney  . Complication of anesthesia 12/2010    pt is very confused, with AMS with anesthesia  . CVA 07/27/2008    CVA affected cognition and memory per family, no focal deficits.    . ESRD 07/27/2008    ESRD due to HTN and NSAID's, started hemodialysis in  2005 in [place], [place]. Went to [facility] from 2010 to 2012 and since 2012 has been getting dialysis at [facility] on [address] in [place] on a MWF schedule. First access with RUA AVG placed in [place]. Next and current access was LUA AVG placed by Dr. Lucky Cowboy in [place] in or around 2012. Has had 2 or 3 procedures on that graft since placed per family. She gets her access work done here in [place] now. She is allergic to heparin and does not get any heparin at dialysis; she had an allergic reaction apparently when in ICU in the past.      . Encephalopathy   . Seizures   . Dyslipidemia   . Chronic renal insufficiency     On hemodialysis  . Arthritis of shoulder     Bilateral  . Depression      Review of Systems  Constitutional: Negative for fever, chills and fatigue.  HENT: Negative for rhinorrhea, sinus pressure and sneezing.   Respiratory: Positive for cough and shortness of breath. Negative for wheezing.   Cardiovascular: Negative for chest pain, palpitations and leg swelling.       Objective:   Physical Exam   Filed Vitals:   11/25/13 1501  BP: 130/66  Pulse: 64  Height:  5' 2.5" (1.588 m)  Weight: 146 lb (66.225 kg)  SpO2: 99%   RA  Gen: well appearing HEENT: NCAT, OP clear PULM: CTA B CV: RRR, systolic murmr I/VI RUSB, JVD noted AB: BS+, soft Ext: warm, no edema, clubbing noted Neuro: A&Ox4, maew     Assessment & Plan:   Pulmonary hypertension I am concerned that [facility] ends pulmonary hypertension may be progressing as she has developed worsening symptoms in the last several months. Specifically she is more short of breath and today on physical exam she is not volume overloaded. Further, despite her lengthy smoking history her lung function testing has never showed evidence of COPD. Her CT chest this year did not show evidence of interstitial lung disease. So by deduction I'm concerned that this is related to her pulmonary hypertension.  Plan: -I  will request a right heart catheterization as she may be amenable to pulmonary vasodilator therapy. -I would request of the right heart catheterization be performed immediately after dialysis if possible -Repeat 6 minute walk -Continue O2 at night  Chronic diastolic heart failure Today on physical exam she appears euvolemic. As noted above I will request a right heart catheterization. If that showed marked pulmonary vascular resistance out of proportion to her left atrial pressure then we could consider cautious addition of a PDE 5 inhibitor which could be adjusted on days of hemodialysis.  TOBACCO USER Again educated at length to quit smoking    Updated Medication List Outpatient Encounter Prescriptions as of 11/25/2013  Medication Sig  . acetaminophen (TYLENOL) 500 MG tablet Take 500 mg by mouth every 6 (six) hours as needed for pain.  [name] acyclovir (ZOVIRAX) 200 MG capsule Take 200 mg by mouth daily as needed (shingles).  [name] albuterol (PROVENTIL HFA;VENTOLIN HFA) 108 (90 BASE) MCG/ACT inhaler Inhale 1-2 puffs into the lungs every 6 (six) hours as needed for wheezing or shortness of breath.  [name] amLODipine (NORVASC) 10 MG tablet Take 10 mg by mouth at bedtime.   [name] aspirin EC 81 MG tablet Take 81 mg by mouth daily.  . benzonatate (TESSALON) 100 MG capsule Take 1 capsule (100 mg total) by mouth every 8 (eight) hours.  . carvedilol (COREG) 25 MG tablet Take 25 mg by mouth 2 (two) times daily with a meal.  . cinacalcet (SENSIPAR) 60 MG tablet Take 60 mg by mouth at bedtime.  [name] guaiFENesin (MUCINEX) 600 MG 12 hr tablet Take 2 tablets (1,200 mg total) by mouth 2 (two) times daily.  . hydrALAZINE (APRESOLINE) 50 MG tablet Take 50 mg by mouth 3 (three) times daily.  . hydrocortisone cream 1 % Apply 1 application topically daily as needed for itching.  . isosorbide mononitrate (IMDUR) 30 MG 24 hr tablet Take 30 mg by mouth daily.  [name] levETIRAcetam (KEPPRA) 500 MG tablet Take 500 mg by mouth daily at 8  pm.  . levothyroxine (SYNTHROID, LEVOTHROID) 88 MCG tablet Take 88 mcg by mouth daily before breakfast.  . loperamide (IMODIUM A-D) 2 MG tablet Take 2 mg by mouth 4 (four) times daily as needed for diarrhea or loose stools.  [name] loratadine (CLARITIN) 10 MG tablet Take 10 mg by mouth daily as needed for allergies.  . multivitamin (RENA-VIT) TABS tablet Take 1 tablet by mouth daily.  [name] NITROSTAT 0.4 MG SL tablet Place 0.4 mg under the tongue every 5 (five) minutes as needed. For chest pain.  [name] omeprazole (PRILOSEC OTC) 20 MG tablet Take 20 mg by mouth daily.  . ondansetron (  ZOFRAN ODT) 8 MG disintegrating tablet Take 1 tablet (8 mg total) by mouth every 8 (eight) hours as needed for nausea or vomiting.  [name] Phenylephrine-APAP-Guaifenesin (MUCINEX FAST-MAX COLD & SINUS) 10-650-400 MG/20ML LIQD Take 30 mLs by mouth daily as needed (for cold).  . pravastatin (PRAVACHOL) 40 MG tablet Take 40 mg by mouth daily.  . sevelamer carbonate (RENVELA) 800 MG tablet Take 1,600-3,200 mg by mouth 3 (three) times daily. 4 tabs TID, 2 tabs with each snack  . traZODone (DESYREL) 50 MG tablet Take 50 mg by mouth at bedtime as needed for sleep.

## 2013-11-25 NOTE — Assessment & Plan Note (Addendum)
I am concerned that [facility] ends pulmonary hypertension may be progressing as she has developed worsening symptoms in the last several months. Specifically she is more short of breath and today on physical exam she is not volume overloaded. Further, despite her lengthy smoking history her lung function testing has never showed evidence of COPD. Her CT chest this year did not show evidence of interstitial lung disease. So by deduction I'm concerned that this is related to her pulmonary hypertension.  Plan: -I will request a right heart catheterization as she may be amenable to pulmonary vasodilator therapy. -I would request of the right heart catheterization be performed immediately after dialysis if possible -Repeat 6 minute walk -Continue O2 at night

## 2013-11-25 NOTE — Patient Instructions (Signed)
We will arrange another 6 minute walk We will ask Dr. Percival Spanish to perform a right heart catheterization to assess your pulmonary hypertension.  Ideally this would be performed just after dialysis. We will see you back in 4 weeks, just after the right heart catheterization

## 2013-11-25 NOTE — Assessment & Plan Note (Signed)
Today on physical exam she appears euvolemic. As noted above I will request a right heart catheterization. If that showed marked pulmonary vascular resistance out of proportion to her left atrial pressure then we could consider cautious addition of a PDE 5 inhibitor which could be adjusted on days of hemodialysis.

## 2013-11-25 NOTE — Assessment & Plan Note (Signed)
Again educated at length to quit smoking

## 2013-11-26 ENCOUNTER — Other Ambulatory Visit: Payer: Self-pay | Admitting: *Deleted

## 2013-11-26 DIAGNOSIS — I5032 Chronic diastolic (congestive) heart failure: Secondary | ICD-10-CM

## 2013-11-28 DIAGNOSIS — E119 Type 2 diabetes mellitus without complications: Secondary | ICD-10-CM | POA: Diagnosis not present

## 2013-11-28 DIAGNOSIS — D631 Anemia in chronic kidney disease: Secondary | ICD-10-CM | POA: Diagnosis not present

## 2013-11-28 DIAGNOSIS — N2581 Secondary hyperparathyroidism of renal origin: Secondary | ICD-10-CM | POA: Diagnosis not present

## 2013-11-28 DIAGNOSIS — N186 End stage renal disease: Secondary | ICD-10-CM | POA: Diagnosis not present

## 2013-11-28 DIAGNOSIS — D509 Iron deficiency anemia, unspecified: Secondary | ICD-10-CM | POA: Diagnosis not present

## 2013-12-01 ENCOUNTER — Encounter: Payer: Self-pay | Admitting: Cardiology

## 2013-12-01 DIAGNOSIS — N186 End stage renal disease: Secondary | ICD-10-CM | POA: Diagnosis not present

## 2013-12-01 DIAGNOSIS — D509 Iron deficiency anemia, unspecified: Secondary | ICD-10-CM | POA: Diagnosis not present

## 2013-12-01 DIAGNOSIS — Z992 Dependence on renal dialysis: Secondary | ICD-10-CM | POA: Diagnosis not present

## 2013-12-01 DIAGNOSIS — D631 Anemia in chronic kidney disease: Secondary | ICD-10-CM | POA: Diagnosis not present

## 2013-12-01 DIAGNOSIS — E119 Type 2 diabetes mellitus without complications: Secondary | ICD-10-CM | POA: Diagnosis not present

## 2013-12-01 DIAGNOSIS — N2581 Secondary hyperparathyroidism of renal origin: Secondary | ICD-10-CM | POA: Diagnosis not present

## 2013-12-02 ENCOUNTER — Other Ambulatory Visit: Payer: Self-pay | Admitting: *Deleted

## 2013-12-02 ENCOUNTER — Ambulatory Visit: Payer: Medicare Other

## 2013-12-02 DIAGNOSIS — D689 Coagulation defect, unspecified: Secondary | ICD-10-CM

## 2013-12-02 DIAGNOSIS — Z01812 Encounter for preprocedural laboratory examination: Secondary | ICD-10-CM

## 2013-12-02 DIAGNOSIS — Z79899 Other long term (current) drug therapy: Secondary | ICD-10-CM

## 2013-12-03 DIAGNOSIS — N2581 Secondary hyperparathyroidism of renal origin: Secondary | ICD-10-CM | POA: Diagnosis not present

## 2013-12-03 DIAGNOSIS — E119 Type 2 diabetes mellitus without complications: Secondary | ICD-10-CM | POA: Diagnosis not present

## 2013-12-03 DIAGNOSIS — D631 Anemia in chronic kidney disease: Secondary | ICD-10-CM | POA: Diagnosis not present

## 2013-12-03 DIAGNOSIS — N186 End stage renal disease: Secondary | ICD-10-CM | POA: Diagnosis not present

## 2013-12-03 DIAGNOSIS — D509 Iron deficiency anemia, unspecified: Secondary | ICD-10-CM | POA: Diagnosis not present

## 2013-12-11 ENCOUNTER — Encounter (HOSPITAL_COMMUNITY): Payer: Self-pay | Admitting: Cardiovascular Disease

## 2013-12-12 DIAGNOSIS — Z01812 Encounter for preprocedural laboratory examination: Secondary | ICD-10-CM | POA: Diagnosis not present

## 2013-12-12 DIAGNOSIS — Z79899 Other long term (current) drug therapy: Secondary | ICD-10-CM | POA: Diagnosis not present

## 2013-12-12 DIAGNOSIS — D689 Coagulation defect, unspecified: Secondary | ICD-10-CM | POA: Diagnosis not present

## 2013-12-12 LAB — CBC
HEMATOCRIT: 33.6 % — AB (ref 36.0–46.0)
Hemoglobin: 11 g/dL — ABNORMAL LOW (ref 12.0–15.0)
MCH: 27.4 pg (ref 26.0–34.0)
MCHC: 32.7 g/dL (ref 30.0–36.0)
MCV: 83.6 fL (ref 78.0–100.0)
MPV: 11.1 fL (ref 9.4–12.4)
Platelets: 189 10*3/uL (ref 150–400)
RBC: 4.02 MIL/uL (ref 3.87–5.11)
RDW: 14.4 % (ref 11.5–15.5)
WBC: 3.8 10*3/uL — ABNORMAL LOW (ref 4.0–10.5)

## 2013-12-13 LAB — PROTIME-INR
INR: 1.08 (ref <1.50)
Prothrombin Time: 14 seconds (ref 11.6–15.2)

## 2013-12-13 LAB — APTT: APTT: 31 s (ref 24–37)

## 2013-12-13 LAB — COMPREHENSIVE METABOLIC PANEL
ALK PHOS: 59 U/L (ref 39–117)
ALT: 10 U/L (ref 0–35)
AST: 14 U/L (ref 0–37)
Albumin: 3.8 g/dL (ref 3.5–5.2)
BUN: 13 mg/dL (ref 6–23)
CALCIUM: 8.8 mg/dL (ref 8.4–10.5)
CHLORIDE: 92 meq/L — AB (ref 96–112)
CO2: 38 mEq/L — ABNORMAL HIGH (ref 19–32)
Creat: 3.93 mg/dL — ABNORMAL HIGH (ref 0.50–1.10)
Glucose, Bld: 108 mg/dL — ABNORMAL HIGH (ref 70–99)
POTASSIUM: 3.5 meq/L (ref 3.5–5.3)
Sodium: 140 mEq/L (ref 135–145)
Total Bilirubin: 0.3 mg/dL (ref 0.2–1.2)
Total Protein: 6.4 g/dL (ref 6.0–8.3)

## 2013-12-13 LAB — URINALYSIS, MICROSCOPIC ONLY

## 2013-12-15 ENCOUNTER — Telehealth: Payer: Self-pay | Admitting: *Deleted

## 2013-12-15 ENCOUNTER — Telehealth (HOSPITAL_COMMUNITY): Payer: Self-pay | Admitting: Cardiology

## 2013-12-15 ENCOUNTER — Other Ambulatory Visit: Payer: Self-pay | Admitting: *Deleted

## 2013-12-15 ENCOUNTER — Telehealth: Payer: Self-pay | Admitting: Cardiology

## 2013-12-15 DIAGNOSIS — Z01812 Encounter for preprocedural laboratory examination: Secondary | ICD-10-CM

## 2013-12-15 NOTE — Telephone Encounter (Signed)
Called and notified patient that she will need to return to the lab to give them a urine specimen. Patient voiced understanding and states that she will need to see if she can get some transportation to the lab.

## 2013-12-15 NOTE — Telephone Encounter (Signed)
[facility] called to report that a urine was ordered and they do not have enough specimen to run the test. Patient will be notified that she needs to return to the lab to leave another specimen.

## 2013-12-15 NOTE — Telephone Encounter (Signed)
Per  Dr. Aundra Dubin Will need to cancel cath scheduled on 12/15 @ 9, provider is unavailable Will need to move out to Jan. 2016, only Tues/Thurs per pt as she has dialysis   cath rescheduled for 1/ 19/15 @ 9, arrival 7 am Pt aware and voiced understanding

## 2013-12-16 LAB — URINALYSIS, MICROSCOPIC ONLY
CASTS: NONE SEEN
CRYSTALS: NONE SEEN

## 2014-01-01 DIAGNOSIS — N186 End stage renal disease: Secondary | ICD-10-CM | POA: Diagnosis not present

## 2014-01-01 DIAGNOSIS — Z992 Dependence on renal dialysis: Secondary | ICD-10-CM | POA: Diagnosis not present

## 2014-01-04 DIAGNOSIS — D631 Anemia in chronic kidney disease: Secondary | ICD-10-CM | POA: Diagnosis not present

## 2014-01-04 DIAGNOSIS — E119 Type 2 diabetes mellitus without complications: Secondary | ICD-10-CM | POA: Diagnosis not present

## 2014-01-04 DIAGNOSIS — D509 Iron deficiency anemia, unspecified: Secondary | ICD-10-CM | POA: Diagnosis not present

## 2014-01-04 DIAGNOSIS — N186 End stage renal disease: Secondary | ICD-10-CM | POA: Diagnosis not present

## 2014-01-04 DIAGNOSIS — N2581 Secondary hyperparathyroidism of renal origin: Secondary | ICD-10-CM | POA: Diagnosis not present

## 2014-01-05 DIAGNOSIS — D509 Iron deficiency anemia, unspecified: Secondary | ICD-10-CM | POA: Diagnosis not present

## 2014-01-05 DIAGNOSIS — D631 Anemia in chronic kidney disease: Secondary | ICD-10-CM | POA: Diagnosis not present

## 2014-01-05 DIAGNOSIS — N2581 Secondary hyperparathyroidism of renal origin: Secondary | ICD-10-CM | POA: Diagnosis not present

## 2014-01-05 DIAGNOSIS — E119 Type 2 diabetes mellitus without complications: Secondary | ICD-10-CM | POA: Diagnosis not present

## 2014-01-05 DIAGNOSIS — N186 End stage renal disease: Secondary | ICD-10-CM | POA: Diagnosis not present

## 2014-01-06 DIAGNOSIS — I129 Hypertensive chronic kidney disease with stage 1 through stage 4 chronic kidney disease, or unspecified chronic kidney disease: Secondary | ICD-10-CM | POA: Diagnosis not present

## 2014-01-06 DIAGNOSIS — Z992 Dependence on renal dialysis: Secondary | ICD-10-CM | POA: Diagnosis not present

## 2014-01-06 DIAGNOSIS — I252 Old myocardial infarction: Secondary | ICD-10-CM | POA: Diagnosis not present

## 2014-01-06 DIAGNOSIS — R569 Unspecified convulsions: Secondary | ICD-10-CM | POA: Diagnosis not present

## 2014-01-06 DIAGNOSIS — N186 End stage renal disease: Secondary | ICD-10-CM | POA: Diagnosis not present

## 2014-01-06 DIAGNOSIS — Z7901 Long term (current) use of anticoagulants: Secondary | ICD-10-CM | POA: Diagnosis not present

## 2014-01-06 DIAGNOSIS — M6281 Muscle weakness (generalized): Secondary | ICD-10-CM | POA: Diagnosis not present

## 2014-01-06 DIAGNOSIS — I482 Chronic atrial fibrillation: Secondary | ICD-10-CM | POA: Diagnosis not present

## 2014-01-07 DIAGNOSIS — N186 End stage renal disease: Secondary | ICD-10-CM | POA: Diagnosis not present

## 2014-01-07 DIAGNOSIS — D631 Anemia in chronic kidney disease: Secondary | ICD-10-CM | POA: Diagnosis not present

## 2014-01-07 DIAGNOSIS — E119 Type 2 diabetes mellitus without complications: Secondary | ICD-10-CM | POA: Diagnosis not present

## 2014-01-07 DIAGNOSIS — D509 Iron deficiency anemia, unspecified: Secondary | ICD-10-CM | POA: Diagnosis not present

## 2014-01-07 DIAGNOSIS — N2581 Secondary hyperparathyroidism of renal origin: Secondary | ICD-10-CM | POA: Diagnosis not present

## 2014-01-09 DIAGNOSIS — E119 Type 2 diabetes mellitus without complications: Secondary | ICD-10-CM | POA: Diagnosis not present

## 2014-01-09 DIAGNOSIS — N186 End stage renal disease: Secondary | ICD-10-CM | POA: Diagnosis not present

## 2014-01-09 DIAGNOSIS — N2581 Secondary hyperparathyroidism of renal origin: Secondary | ICD-10-CM | POA: Diagnosis not present

## 2014-01-09 DIAGNOSIS — D631 Anemia in chronic kidney disease: Secondary | ICD-10-CM | POA: Diagnosis not present

## 2014-01-09 DIAGNOSIS — D509 Iron deficiency anemia, unspecified: Secondary | ICD-10-CM | POA: Diagnosis not present

## 2014-01-13 DIAGNOSIS — E119 Type 2 diabetes mellitus without complications: Secondary | ICD-10-CM | POA: Diagnosis not present

## 2014-01-13 DIAGNOSIS — N186 End stage renal disease: Secondary | ICD-10-CM | POA: Diagnosis not present

## 2014-01-13 DIAGNOSIS — N2581 Secondary hyperparathyroidism of renal origin: Secondary | ICD-10-CM | POA: Diagnosis not present

## 2014-01-13 DIAGNOSIS — D631 Anemia in chronic kidney disease: Secondary | ICD-10-CM | POA: Diagnosis not present

## 2014-01-13 DIAGNOSIS — D509 Iron deficiency anemia, unspecified: Secondary | ICD-10-CM | POA: Diagnosis not present

## 2014-01-14 DIAGNOSIS — E119 Type 2 diabetes mellitus without complications: Secondary | ICD-10-CM | POA: Diagnosis not present

## 2014-01-14 DIAGNOSIS — D509 Iron deficiency anemia, unspecified: Secondary | ICD-10-CM | POA: Diagnosis not present

## 2014-01-14 DIAGNOSIS — N186 End stage renal disease: Secondary | ICD-10-CM | POA: Diagnosis not present

## 2014-01-14 DIAGNOSIS — N2581 Secondary hyperparathyroidism of renal origin: Secondary | ICD-10-CM | POA: Diagnosis not present

## 2014-01-14 DIAGNOSIS — D631 Anemia in chronic kidney disease: Secondary | ICD-10-CM | POA: Diagnosis not present

## 2014-01-15 DIAGNOSIS — N186 End stage renal disease: Secondary | ICD-10-CM | POA: Diagnosis not present

## 2014-01-15 DIAGNOSIS — I129 Hypertensive chronic kidney disease with stage 1 through stage 4 chronic kidney disease, or unspecified chronic kidney disease: Secondary | ICD-10-CM | POA: Diagnosis not present

## 2014-01-15 DIAGNOSIS — R569 Unspecified convulsions: Secondary | ICD-10-CM | POA: Diagnosis not present

## 2014-01-15 DIAGNOSIS — M6281 Muscle weakness (generalized): Secondary | ICD-10-CM | POA: Diagnosis not present

## 2014-01-15 DIAGNOSIS — I482 Chronic atrial fibrillation: Secondary | ICD-10-CM | POA: Diagnosis not present

## 2014-01-15 DIAGNOSIS — Z7901 Long term (current) use of anticoagulants: Secondary | ICD-10-CM | POA: Diagnosis not present

## 2014-01-16 DIAGNOSIS — D631 Anemia in chronic kidney disease: Secondary | ICD-10-CM | POA: Diagnosis not present

## 2014-01-16 DIAGNOSIS — N2581 Secondary hyperparathyroidism of renal origin: Secondary | ICD-10-CM | POA: Diagnosis not present

## 2014-01-16 DIAGNOSIS — E119 Type 2 diabetes mellitus without complications: Secondary | ICD-10-CM | POA: Diagnosis not present

## 2014-01-16 DIAGNOSIS — N186 End stage renal disease: Secondary | ICD-10-CM | POA: Diagnosis not present

## 2014-01-16 DIAGNOSIS — D509 Iron deficiency anemia, unspecified: Secondary | ICD-10-CM | POA: Diagnosis not present

## 2014-01-19 DIAGNOSIS — N186 End stage renal disease: Secondary | ICD-10-CM | POA: Diagnosis not present

## 2014-01-19 DIAGNOSIS — E119 Type 2 diabetes mellitus without complications: Secondary | ICD-10-CM | POA: Diagnosis not present

## 2014-01-19 DIAGNOSIS — D631 Anemia in chronic kidney disease: Secondary | ICD-10-CM | POA: Diagnosis not present

## 2014-01-19 DIAGNOSIS — N2581 Secondary hyperparathyroidism of renal origin: Secondary | ICD-10-CM | POA: Diagnosis not present

## 2014-01-19 DIAGNOSIS — D509 Iron deficiency anemia, unspecified: Secondary | ICD-10-CM | POA: Diagnosis not present

## 2014-01-20 ENCOUNTER — Telehealth: Payer: Self-pay | Admitting: Cardiology

## 2014-01-20 ENCOUNTER — Ambulatory Visit (HOSPITAL_COMMUNITY): Admission: RE | Admit: 2014-01-20 | Payer: Medicare Other | Source: Ambulatory Visit | Admitting: Cardiology

## 2014-01-20 ENCOUNTER — Encounter (HOSPITAL_COMMUNITY): Admission: RE | Payer: Self-pay | Source: Ambulatory Visit

## 2014-01-20 ENCOUNTER — Telehealth: Payer: Self-pay | Admitting: Physician Assistant

## 2014-01-20 DIAGNOSIS — I129 Hypertensive chronic kidney disease with stage 1 through stage 4 chronic kidney disease, or unspecified chronic kidney disease: Secondary | ICD-10-CM | POA: Diagnosis not present

## 2014-01-20 DIAGNOSIS — Z7901 Long term (current) use of anticoagulants: Secondary | ICD-10-CM | POA: Diagnosis not present

## 2014-01-20 DIAGNOSIS — M6281 Muscle weakness (generalized): Secondary | ICD-10-CM | POA: Diagnosis not present

## 2014-01-20 DIAGNOSIS — R569 Unspecified convulsions: Secondary | ICD-10-CM | POA: Diagnosis not present

## 2014-01-20 DIAGNOSIS — N186 End stage renal disease: Secondary | ICD-10-CM | POA: Diagnosis not present

## 2014-01-20 DIAGNOSIS — I482 Chronic atrial fibrillation: Secondary | ICD-10-CM | POA: Diagnosis not present

## 2014-01-20 SURGERY — RIGHT HEART CATH
Anesthesia: LOCAL

## 2014-01-20 NOTE — Telephone Encounter (Signed)
Spoke with the cath lab, the patient has been called and procedure has been rescheduled.

## 2014-01-20 NOTE — Telephone Encounter (Signed)
Per answering service-Pt is scheduled for a procedure this morning at 7. She has broken out in shingles. Please call with instructions.

## 2014-01-20 NOTE — Telephone Encounter (Signed)
Ms. Caridi called answering service while fellow was on call, reporting that she has broken out in shingles. The fellow spoke with the cath lab who is actively trying to reschedule her per their protocol.  They told me they would be calling the patient with this information. I received a repeat page from the patient with the same information. I tried calling the patient back but got her voicemail x2 - I left her a message letting her know someone from the hospital lab would be calling her shortly to reschedule. Reyhan Moronta PA-C

## 2014-01-21 DIAGNOSIS — E119 Type 2 diabetes mellitus without complications: Secondary | ICD-10-CM | POA: Diagnosis not present

## 2014-01-21 DIAGNOSIS — D509 Iron deficiency anemia, unspecified: Secondary | ICD-10-CM | POA: Diagnosis not present

## 2014-01-21 DIAGNOSIS — D631 Anemia in chronic kidney disease: Secondary | ICD-10-CM | POA: Diagnosis not present

## 2014-01-21 DIAGNOSIS — N186 End stage renal disease: Secondary | ICD-10-CM | POA: Diagnosis not present

## 2014-01-21 DIAGNOSIS — N2581 Secondary hyperparathyroidism of renal origin: Secondary | ICD-10-CM | POA: Diagnosis not present

## 2014-01-23 DIAGNOSIS — N186 End stage renal disease: Secondary | ICD-10-CM | POA: Diagnosis not present

## 2014-01-23 DIAGNOSIS — D631 Anemia in chronic kidney disease: Secondary | ICD-10-CM | POA: Diagnosis not present

## 2014-01-23 DIAGNOSIS — D509 Iron deficiency anemia, unspecified: Secondary | ICD-10-CM | POA: Diagnosis not present

## 2014-01-23 DIAGNOSIS — E119 Type 2 diabetes mellitus without complications: Secondary | ICD-10-CM | POA: Diagnosis not present

## 2014-01-23 DIAGNOSIS — N2581 Secondary hyperparathyroidism of renal origin: Secondary | ICD-10-CM | POA: Diagnosis not present

## 2014-01-26 DIAGNOSIS — N186 End stage renal disease: Secondary | ICD-10-CM | POA: Diagnosis not present

## 2014-01-26 DIAGNOSIS — D631 Anemia in chronic kidney disease: Secondary | ICD-10-CM | POA: Diagnosis not present

## 2014-01-26 DIAGNOSIS — D509 Iron deficiency anemia, unspecified: Secondary | ICD-10-CM | POA: Diagnosis not present

## 2014-01-26 DIAGNOSIS — E119 Type 2 diabetes mellitus without complications: Secondary | ICD-10-CM | POA: Diagnosis not present

## 2014-01-26 DIAGNOSIS — N2581 Secondary hyperparathyroidism of renal origin: Secondary | ICD-10-CM | POA: Diagnosis not present

## 2014-01-28 DIAGNOSIS — N2581 Secondary hyperparathyroidism of renal origin: Secondary | ICD-10-CM | POA: Diagnosis not present

## 2014-01-28 DIAGNOSIS — E119 Type 2 diabetes mellitus without complications: Secondary | ICD-10-CM | POA: Diagnosis not present

## 2014-01-28 DIAGNOSIS — D631 Anemia in chronic kidney disease: Secondary | ICD-10-CM | POA: Diagnosis not present

## 2014-01-28 DIAGNOSIS — N186 End stage renal disease: Secondary | ICD-10-CM | POA: Diagnosis not present

## 2014-01-28 DIAGNOSIS — D509 Iron deficiency anemia, unspecified: Secondary | ICD-10-CM | POA: Diagnosis not present

## 2014-01-29 ENCOUNTER — Ambulatory Visit (HOSPITAL_COMMUNITY)
Admission: RE | Admit: 2014-01-29 | Discharge: 2014-01-29 | Disposition: A | Discharge status: Home / Self Care | Payer: Medicare Other | Source: Ambulatory Visit | Attending: Cardiology | Admitting: Cardiology

## 2014-01-29 ENCOUNTER — Encounter (HOSPITAL_COMMUNITY)
Admission: RE | Disposition: A | Discharge status: Home / Self Care | Payer: Self-pay | Source: Ambulatory Visit | Attending: Cardiology

## 2014-01-29 ENCOUNTER — Encounter (HOSPITAL_COMMUNITY): Payer: Self-pay | Admitting: Cardiology

## 2014-01-29 DIAGNOSIS — Z79899 Other long term (current) drug therapy: Secondary | ICD-10-CM | POA: Insufficient documentation

## 2014-01-29 DIAGNOSIS — I120 Hypertensive chronic kidney disease with stage 5 chronic kidney disease or end stage renal disease: Secondary | ICD-10-CM | POA: Diagnosis not present

## 2014-01-29 DIAGNOSIS — I272 Other secondary pulmonary hypertension: Secondary | ICD-10-CM | POA: Insufficient documentation

## 2014-01-29 DIAGNOSIS — E785 Hyperlipidemia, unspecified: Secondary | ICD-10-CM | POA: Diagnosis not present

## 2014-01-29 DIAGNOSIS — F329 Major depressive disorder, single episode, unspecified: Secondary | ICD-10-CM | POA: Insufficient documentation

## 2014-01-29 DIAGNOSIS — I5030 Unspecified diastolic (congestive) heart failure: Secondary | ICD-10-CM | POA: Diagnosis not present

## 2014-01-29 DIAGNOSIS — E039 Hypothyroidism, unspecified: Secondary | ICD-10-CM | POA: Insufficient documentation

## 2014-01-29 DIAGNOSIS — M13811 Other specified arthritis, right shoulder: Secondary | ICD-10-CM | POA: Insufficient documentation

## 2014-01-29 DIAGNOSIS — I2510 Atherosclerotic heart disease of native coronary artery without angina pectoris: Secondary | ICD-10-CM | POA: Diagnosis not present

## 2014-01-29 DIAGNOSIS — Z7982 Long term (current) use of aspirin: Secondary | ICD-10-CM | POA: Diagnosis not present

## 2014-01-29 DIAGNOSIS — J441 Chronic obstructive pulmonary disease with (acute) exacerbation: Secondary | ICD-10-CM | POA: Diagnosis not present

## 2014-01-29 DIAGNOSIS — Z992 Dependence on renal dialysis: Secondary | ICD-10-CM | POA: Diagnosis not present

## 2014-01-29 DIAGNOSIS — M13812 Other specified arthritis, left shoulder: Secondary | ICD-10-CM | POA: Diagnosis not present

## 2014-01-29 DIAGNOSIS — I739 Peripheral vascular disease, unspecified: Secondary | ICD-10-CM | POA: Diagnosis not present

## 2014-01-29 DIAGNOSIS — Z8673 Personal history of transient ischemic attack (TIA), and cerebral infarction without residual deficits: Secondary | ICD-10-CM | POA: Diagnosis not present

## 2014-01-29 DIAGNOSIS — I5022 Chronic systolic (congestive) heart failure: Secondary | ICD-10-CM | POA: Diagnosis not present

## 2014-01-29 DIAGNOSIS — N186 End stage renal disease: Secondary | ICD-10-CM | POA: Insufficient documentation

## 2014-01-29 DIAGNOSIS — I270 Primary pulmonary hypertension: Secondary | ICD-10-CM | POA: Diagnosis not present

## 2014-01-29 HISTORY — PX: RIGHT HEART CATHETERIZATION: SHX5447

## 2014-01-29 LAB — CBC
HCT: 37.6 % (ref 36.0–46.0)
Hemoglobin: 11.8 g/dL — ABNORMAL LOW (ref 12.0–15.0)
MCH: 27.5 pg (ref 26.0–34.0)
MCHC: 31.4 g/dL (ref 30.0–36.0)
MCV: 87.6 fL (ref 78.0–100.0)
Platelets: 211 10*3/uL (ref 150–400)
RBC: 4.29 MIL/uL (ref 3.87–5.11)
RDW: 17.1 % — ABNORMAL HIGH (ref 11.5–15.5)
WBC: 4.6 10*3/uL (ref 4.0–10.5)

## 2014-01-29 LAB — POCT I-STAT 3, VENOUS BLOOD GAS (G3P V)
Acid-Base Excess: 7 mmol/L — ABNORMAL HIGH (ref 0.0–2.0)
Acid-Base Excess: 7 mmol/L — ABNORMAL HIGH (ref 0.0–2.0)
Bicarbonate: 32.6 mEq/L — ABNORMAL HIGH (ref 20.0–24.0)
Bicarbonate: 32.8 mEq/L — ABNORMAL HIGH (ref 20.0–24.0)
O2 SAT: 69 %
O2 Saturation: 70 %
PH VEN: 7.44 — AB (ref 7.250–7.300)
PO2 VEN: 35 mmHg (ref 30.0–45.0)
TCO2: 34 mmol/L (ref 0–100)
TCO2: 34 mmol/L (ref 0–100)
pCO2, Ven: 48 mmHg (ref 45.0–50.0)
pCO2, Ven: 48.6 mmHg (ref 45.0–50.0)
pH, Ven: 7.438 — ABNORMAL HIGH (ref 7.250–7.300)
pO2, Ven: 36 mmHg (ref 30.0–45.0)

## 2014-01-29 LAB — PROTIME-INR
INR: 1.05 (ref 0.00–1.49)
PROTHROMBIN TIME: 13.8 s (ref 11.6–15.2)

## 2014-01-29 LAB — BASIC METABOLIC PANEL
ANION GAP: 15 (ref 5–15)
BUN: 25 mg/dL — AB (ref 6–23)
CALCIUM: 8.6 mg/dL (ref 8.4–10.5)
CHLORIDE: 92 mmol/L — AB (ref 96–112)
CO2: 31 mmol/L (ref 19–32)
Creatinine, Ser: 6.79 mg/dL — ABNORMAL HIGH (ref 0.50–1.10)
GFR calc Af Amer: 6 mL/min — ABNORMAL LOW (ref >90)
GFR calc non Af Amer: 6 mL/min — ABNORMAL LOW (ref >90)
GLUCOSE: 84 mg/dL (ref 70–99)
Potassium: 5.1 mmol/L (ref 3.5–5.1)
SODIUM: 138 mmol/L (ref 135–145)

## 2014-01-29 SURGERY — RIGHT HEART CATH
Anesthesia: LOCAL

## 2014-01-29 MED ORDER — LIDOCAINE HCL (PF) 1 % IJ SOLN
INTRAMUSCULAR | Status: AC
  Filled 2014-01-29: qty 30

## 2014-01-29 MED ORDER — SODIUM CHLORIDE 0.9 % IJ SOLN: 3.0000 mL | Freq: Two times a day (BID) | INTRAMUSCULAR | Status: DC

## 2014-01-29 MED ORDER — SODIUM CHLORIDE 0.9 % IV SOLN: 250.0000 mL | INTRAVENOUS | Status: DC | PRN

## 2014-01-29 MED ORDER — MIDAZOLAM HCL 2 MG/2ML IJ SOLN
INTRAMUSCULAR | Status: AC
  Filled 2014-01-29: qty 2

## 2014-01-29 MED ORDER — SODIUM CHLORIDE 0.9 % IV SOLN
INTRAVENOUS | Status: DC
  Administered 2014-01-29: 10 mL/h via INTRAVENOUS
  Administered 2014-01-29: 06:00:00 via INTRAVENOUS

## 2014-01-29 MED ORDER — SODIUM CHLORIDE 0.9 % IV SOLN
INTRAVENOUS | Status: AC
  Filled 2014-01-29: qty 250

## 2014-01-29 MED ORDER — DIPHENHYDRAMINE HCL 50 MG/ML IJ SOLN
INTRAMUSCULAR | Status: AC
  Filled 2014-01-29: qty 1

## 2014-01-29 MED ORDER — ASPIRIN 81 MG PO CHEW: 81.0000 mg | CHEWABLE_TABLET | ORAL | Status: DC

## 2014-01-29 MED ORDER — SODIUM CHLORIDE 0.9 % IJ SOLN: 3.0000 mL | INTRAMUSCULAR | Status: DC | PRN

## 2014-01-29 MED ORDER — FENTANYL CITRATE 0.05 MG/ML IJ SOLN
INTRAMUSCULAR | Status: AC
  Filled 2014-01-29: qty 2

## 2014-01-29 MED ORDER — ACETAMINOPHEN 325 MG PO TABS: 650.0000 mg | ORAL_TABLET | ORAL | Status: DC | PRN

## 2014-01-29 MED ORDER — ONDANSETRON HCL 4 MG/2ML IJ SOLN: 4.0000 mg | Freq: Four times a day (QID) | INTRAMUSCULAR | Status: DC | PRN

## 2014-01-29 NOTE — CV Procedure (Signed)
    Cardiac Catheterization Procedure Note  Name: [name] MRN: [id number] DOB: 05-11-1945  Procedure: Right Heart Cath  Indication:  Severe pulmonary hypertension by echo measurement.    Procedural Details: The right groin was prepped, draped, and anesthetized with 1% lidocaine. Using the modified Seldinger technique a 7 French sheath was placed in the right femoral vein. A Swan-Ganz catheter was used for the right heart catheterization. Standard protocol was followed for recording of right heart pressures and sampling of oxygen saturations. Fick and thermodilution cardiac output was calculated. There were no immediate procedural complications. The patient was transferred to the post catheterization recovery area for further monitoring.  Procedural Findings: Hemodynamics (mmHg) RA mean 1 RV 52/1 PA 52/20, mean 30 PCWP mean 6  Oxygen saturations: PA 70% AO 95%  Cardiac Output (Fick) 5.77  Cardiac Index (Fick) 3.32 PVR 4.2 WU  Cardiac Output (Thermo) 5.12 Cardiac Index (Thermo) 2.94  PVR 4.7 WU  Final Conclusions:  Normal right and left heart filling pressures.  Normal cardiac output.  Moderate pulmonary arterial hypertension.  Treatment per Dr Lake Bells.  We would be glad to see her in clinic as well as needed, will wait for direction from Dr Lake Bells.   Loralie Champagne 01/29/2014, 8:55 AM

## 2014-01-29 NOTE — Discharge Instructions (Signed)
Angiogram, Care After °Refer to this sheet in the next few weeks. These instructions provide you with information on caring for yourself after your procedure. Your health care provider may also give you more specific instructions. Your treatment has been planned according to current medical practices, but problems sometimes occur. Call your health care provider if you have any problems or questions after your procedure.  °WHAT TO EXPECT AFTER THE PROCEDURE °After your procedure, it is typical to have the following sensations: °· Minor discomfort or tenderness and a small bump at the catheter insertion site. The bump should usually decrease in size and tenderness within 1 to 2 weeks. °· Any bruising will usually fade within 2 to 4 weeks. °HOME CARE INSTRUCTIONS  °· You may need to keep taking blood thinners if they were prescribed for you. Take medicines only as directed by your health care provider. °· Do not apply powder or lotion to the site. °· Do not take baths, swim, or use a hot tub until your health care provider approves. °· You may shower 24 hours after the procedure. Remove the bandage (dressing) and gently wash the site with plain soap and water. Gently pat the site dry. °· Inspect the site at least twice daily. °· Limit your activity for the first 48 hours. Do not bend, squat, or lift anything over 20 lb (9 kg) or as directed by your health care provider. °· Plan to have someone take you home after the procedure. Follow instructions about when you can drive or return to work. °SEEK MEDICAL CARE IF: °· You get light-headed when standing up. °· You have drainage (other than a small amount of blood on the dressing). °· You have chills. °· You have a fever. °· You have redness, warmth, swelling, or pain at the insertion site. °SEEK IMMEDIATE MEDICAL CARE IF:  °· You develop chest pain or shortness of breath, feel faint, or pass out. °· You have bleeding, swelling larger than a walnut, or drainage from the  catheter insertion site. °· You develop pain, discoloration, coldness, or severe bruising in the leg or arm that held the catheter. °· You have heavy bleeding from the site. If this happens, hold pressure on the site and call 911. °MAKE SURE YOU: °· Understand these instructions. °· Will watch your condition. °· Will get help right away if you are not doing well or get worse. °Document Released: 07/07/2004 Document Revised: 05/05/2013 Document Reviewed: 05/13/2012 °ExitCare® Patient Information ©2015 [employer]. This information is not intended to replace advice given to you by your health care provider. Make sure you discuss any questions you have with your health care provider. ° °

## 2014-01-29 NOTE — Progress Notes (Signed)
Site area: right groin  Site Prior to Removal:  Level 0  Pressure Applied For 10 MINUTES    Minutes Beginning at [phone]  Manual:   Yes.    Patient Status During Pull:  stable  Post Pull Groin Site:  Level 0  Post Pull Instructions Given:  Yes.    Post Pull Pulses Present:  Yes.    Dressing Applied:  Yes.     Bedrest Begins: [id number]  Comments:  Pt tolerated right femoral venous sheath pull well. VSS. Site unremarkable

## 2014-01-29 NOTE — H&P (Signed)
Advanced Heart Failure Team History and Physical Note    Primary Cardiologist:  Dr Percival Spanish Pulmonary: Dr Lake Bells   HPI:   [name] is a 69 year old with history of ESRD, CAD stent RCA, CVA, PAD, diastolic HF, shingles, and pulmonary HTN. Followed closely by Dr Lake Bells with normal PFTs. Most recent cath 2011.   Ongoing dyspnea with exertion.    04/08/2013 full pulmonary function test> ratio 77%, FEV1 1.44 L (80% predicted), total lung capacity 3.55 (72% predicted), ERV 0.23 L (129% predicted), DLCO 10.95 (47% predicted)   Review of Systems: [y] = yes, [ ]  = no   General: Weight gain [ ] ; Weight loss [ ] ; Anorexia [ ] ; Fatigue [Y ]; Fever [ ] ; Chills [ ] ; Weakness [ ]   Cardiac: Chest pain/pressure [ ] ; Resting SOB [ ] ; Exertional SOB [ Y]; Orthopnea [ ] ; Pedal Edema [ ] ; Palpitations [ ] ; Syncope [ ] ; Presyncope [ ] ; Paroxysmal nocturnal dyspnea[ ]   Pulmonary: Cough [ ] ; Wheezing[ ] ; Hemoptysis[ ] ; Sputum [ ] ; Snoring [ ]   GI: Vomiting[ ] ; Dysphagia[ ] ; Melena[ ] ; Hematochezia [ ] ; Heartburn[ ] ; Abdominal pain [ ] ; Constipation [ ] ; Diarrhea [ ] ; BRBPR [ ]   GU: Hematuria[ ] ; Dysuria [ ] ; Nocturia[ ]   Vascular: Pain in legs with walking [ ] ; Pain in feet with lying flat [ ] ; Non-healing sores [ ] ; Stroke [ ] ; TIA [ ] ; Slurred speech [ ] ;  Neuro: Headaches[ ] ; Vertigo[ ] ; Seizures[ ] ; Paresthesias[ ] ;Blurred vision [ ] ; Diplopia [ ] ; Vision changes [ ]   Ortho/Skin: Arthritis [ ] ; Joint pain [Y ]; Muscle pain [ ] ; Joint swelling [ ] ; Back Pain [ ] ; Rash [ ]   Psych: Depression[ ] ; Anxiety[ ]   Heme: Bleeding problems [ ] ; Clotting disorders [ ] ; Anemia [ ]   Endocrine: Diabetes [ ] ; Thyroid dysfunction[ ]   Home Medications Prior to Admission medications   Medication Sig Start Date End Date Taking? Authorizing Provider  acetaminophen (TYLENOL) 500 MG tablet Take 500 mg by mouth every 6 (six) hours as needed for pain.   Yes Historical Provider, MD  acyclovir (ZOVIRAX) 200 MG capsule Take  200 mg by mouth daily as needed (shingles).   Yes Historical Provider, MD  amLODipine (NORVASC) 10 MG tablet Take 10 mg by mouth at bedtime.    Yes Historical Provider, MD  aspirin EC 81 MG tablet Take 81 mg by mouth daily.   Yes Historical Provider, MD  carvedilol (COREG) 25 MG tablet Take 25 mg by mouth 2 (two) times daily with a meal.   Yes Historical Provider, MD  cinacalcet (SENSIPAR) 60 MG tablet Take 60 mg by mouth at bedtime.   Yes Historical Provider, MD  hydrALAZINE (APRESOLINE) 50 MG tablet Take 50 mg by mouth 3 (three) times daily.   Yes Historical Provider, MD  hydrOXYzine (ATARAX/VISTARIL) 10 MG tablet Take 10 mg by mouth daily as needed (every morning on dialysis days).    Yes Historical Provider, MD  isosorbide mononitrate (IMDUR) 30 MG 24 hr tablet Take 30 mg by mouth daily.   Yes Historical Provider, MD  levETIRAcetam (KEPPRA) 500 MG tablet Take 500 mg by mouth daily at 8 pm.   Yes Historical Provider, MD  omeprazole (PRILOSEC OTC) 20 MG tablet Take 20 mg by mouth daily.   Yes Historical Provider, MD  pravastatin (PRAVACHOL) 40 MG tablet Take 40 mg by mouth daily.   Yes Historical Provider, MD  sevelamer carbonate (RENVELA) 800 MG tablet Take 1,600-3,200 mg  by mouth 3 (three) times daily. 4 tabs TID, 2 tabs with each snack   Yes Historical Provider, MD  traZODone (DESYREL) 50 MG tablet Take 50 mg by mouth at bedtime as needed for sleep.    Yes Historical Provider, MD  albuterol (PROVENTIL HFA;VENTOLIN HFA) 108 (90 BASE) MCG/ACT inhaler Inhale 1-2 puffs into the lungs every 6 (six) hours as needed for wheezing or shortness of breath. 10/06/13   Kalman Drape, MD  benzonatate (TESSALON) 100 MG capsule Take 1 capsule (100 mg total) by mouth every 8 (eight) hours. Patient not taking: Reported on 12/11/2013 10/06/13   Kalman Drape, MD  guaiFENesin (MUCINEX) 600 MG 12 hr tablet Take 2 tablets (1,200 mg total) by mouth 2 (two) times daily. Patient not taking: Reported on 12/11/2013 10/06/13    Kalman Drape, MD  hydrocortisone cream 1 % Apply 1 application topically daily as needed for itching.    Historical Provider, MD  levothyroxine (SYNTHROID, LEVOTHROID) 88 MCG tablet Take 88 mcg by mouth daily before breakfast.    Historical Provider, MD  loperamide (IMODIUM A-D) 2 MG tablet Take 2 mg by mouth 4 (four) times daily as needed for diarrhea or loose stools.    Historical Provider, MD  loratadine (CLARITIN) 10 MG tablet Take 10 mg by mouth daily as needed for allergies.    Historical Provider, MD  multivitamin (RENA-VIT) TABS tablet Take 1 tablet by mouth daily.    Historical Provider, MD  NITROSTAT 0.4 MG SL tablet Place 0.4 mg under the tongue every 5 (five) minutes as needed for chest pain.  09/16/10   Historical Provider, MD  ondansetron (ZOFRAN ODT) 8 MG disintegrating tablet Take 1 tablet (8 mg total) by mouth every 8 (eight) hours as needed for nausea or vomiting. Patient not taking: Reported on 12/11/2013 10/06/13   Kalman Drape, MD    Past Medical History: Past Medical History  Diagnosis Date  . CAD (coronary artery disease)     stent to RCA  . CVA (cerebral infarction) 2003    no apparent residual  . Hypothyroidism   . PVD (peripheral vascular disease)   . Hyperlipidemia   . Positive PPD     completed rifampin  . Diastolic congestive heart failure   . Hypertension   . COPD (chronic obstructive pulmonary disease) t  . Cancer     clear cell cancer, kidney  . Complication of anesthesia 12/2010    pt is very confused, with AMS with anesthesia  . CVA 07/27/2008    CVA affected cognition and memory per family, no focal deficits.    . ESRD 07/27/2008    ESRD due to HTN and NSAID's, started hemodialysis in 2005 in [place], [place]. Went to [facility] from 2010 to 2012 and since 2012 has been getting dialysis at [facility] on [address] in [place] on a MWF schedule. First access with RUA AVG placed in [place]. Next and current access was LUA AVG placed by Dr. Lucky Cowboy  in [place] in or around 2012. Has had 2 or 3 procedures on that graft since placed per family. She gets her access work done here in [place] now. She is allergic to heparin and does not get any heparin at dialysis; she had an allergic reaction apparently when in ICU in the past.      . Encephalopathy   . Seizures   . Dyslipidemia   . Chronic renal insufficiency     On hemodialysis  . Arthritis of  shoulder     Bilateral  . Depression     Past Surgical History: Past Surgical History  Procedure Laterality Date  . Abdominal hysterectomy    . Cholecystectomy    . D&cs    . Right ankle repair    . Left nephrectomy    . Vascular surgery  11/2010    graft inserted to left arm  . Nephrectomy Left     Malignant tumor  . Cataract extraction Bilateral   . Tonsillectomy    . Cardiac catheterization    . Left heart catheterization with coronary angiogram N/A 02/22/2011    Procedure: LEFT HEART CATHETERIZATION WITH CORONARY ANGIOGRAM;  Surgeon: Wellington Hampshire, MD;  Location: [facility] CATH [facility];  Service: Cardiovascular;  Laterality: N/A;    Family History: Family History  Problem Relation Age of Onset  . Heart disease Father   . Hypertension Mother   . Dementia Mother   . Coronary artery disease Sister   . Heart attack Sister   . Hypertension Brother     Social History: History   Social History  . Marital Status: Divorced    Spouse Name: N/A    Number of Children: 2  . Years of Education: 16   Occupational History  . Retired Marine scientist   .     Social History Main Topics  . Smoking status: Current Every Day Smoker -- 1.00 packs/day for 53 years    Types: Cigarettes  . Smokeless tobacco: Never Used     Comment: smoking 2 cigs daily 11/24  . Alcohol Use: 0.0 oz/week    0 Not specified per week     Comment: very rarely per pt  . Drug Use: No     Comment: former marijuana use, several years  . Sexual Activity: Not Currently    Birth Control/ Protection: Post-menopausal    Other Topics Concern  . Not on file   Social History Narrative    Allergies:  Allergies  Allergen Reactions  . Heparin Other (See Comments)    MDs told her not to take after reaction in ICU  . Iohexol Swelling and Other (See Comments)    1970s; passed out and had facial/tongue swelling.  Requires 13-hour prep with prednisone and benadryl  . Phenytoin Other (See Comments)    Had reaction while in ICU; doesn't know.  MDs told her not to take ever again.  . Wellbutrin [Bupropion] Other (See Comments)    seizures  . Ampicillin Hives and Rash  . Meperidine Hcl Swelling and Rash    Makes tongue swell  . Morphine Rash  . Penicillins Rash  . Valproic Acid And Related Other (See Comments)    Confusion   . Ace Inhibitors Rash  . Pentazocine Lactate Other (See Comments)    Patient does not remember reaction to this med ([place]).     Objective:    Vital Signs:   Temp:  [97.6 F (36.4 C)] 97.6 F (36.4 C) (01/28 0542) Pulse Rate:  [70] 70 (01/28 0542) Resp:  [18] 18 (01/28 0542) BP: (114)/(75) 114/75 mmHg (01/28 0542) SpO2:  [100 %] 100 % (01/28 0542) Weight:  [160 lb (72.576 kg)] 160 lb (72.576 kg) (01/28 0542)   Filed Weights   01/29/14 0542  Weight: 160 lb (72.576 kg)    Physical Exam: General:  Well appearing. No resp difficulty HEENT: normal Neck: supple. JVP 8-9. Carotids 2+ bilat; no bruits. No lymphadenopathy or thryomegaly appreciated. Cor: PMI nondisplaced. Regular rate & rhythm. No  rubs, gallops or murmurs. Lungs: clear Abdomen: soft, nontender, nondistended. No hepatosplenomegaly. No bruits or masses. Good bowel sounds. Extremities: no cyanosis, clubbing, rash, edema Neuro: alert & orientedx3, cranial nerves grossly intact. moves all 4 extremities w/o difficulty. Affect pleasant  Telemetry: NSR  Labs: Basic Metabolic Panel: No results for input(s): NA, K, CL, CO2, GLUCOSE, BUN, CREATININE, CALCIUM, MG, PHOS in the last 168 hours.  Liver Function  Tests: No results for input(s): AST, ALT, ALKPHOS, BILITOT, PROT, ALBUMIN in the last 168 hours. No results for input(s): LIPASE, AMYLASE in the last 168 hours. No results for input(s): AMMONIA in the last 168 hours.  CBC:  Recent Labs Lab 01/29/14 0610  WBC 4.6  HGB 11.8*  HCT 37.6  MCV 87.6  PLT 211    Cardiac Enzymes: No results for input(s): CKTOTAL, CKMB, CKMBINDEX, TROPONINI in the last 168 hours.  BNP: BNP (last 3 results)  Recent Labs  02/16/13 1945  PROBNP 31758.0*    CBG: No results for input(s): GLUCAP in the last 168 hours.  Coagulation Studies:  Recent Labs  01/29/14 0610  LABPROT 13.8  INR 1.05    Other results: EKG:  Imaging:  No results found.      Assessment/Plan  1. Dyspnea 2. Pulmonary HTN by echo 3. Chronic Systolic HF: EF 69-45% by last echo.   4. ESRD 5. CAD  Today [name] presents for RHC to further assess pulmonary hypertension and determine if she pulmonary vasodilator therapy.    Length of Stay: 0   CLEGG,AMY 01/29/2014, 7:06 AM  Advanced Heart Failure Team Pager [phone] (M-F; [place])  Please contact [facility] for night-coverage after hours (4p -7a ) and weekends on [email or web address]  Patient seen with NP, agree with the above note.  RHC requested by pulmonary service for evaluation of pulmonary hypertension.  She does not have significant COPD or interstitial lung disease and PA systolic pressure was 003 mmHg by echo.  Pulmonary considering her for pulmonary vasodilator therapy.   Loralie Champagne 01/29/2014 8:08 AM

## 2014-01-30 DIAGNOSIS — D509 Iron deficiency anemia, unspecified: Secondary | ICD-10-CM | POA: Diagnosis not present

## 2014-01-30 DIAGNOSIS — E119 Type 2 diabetes mellitus without complications: Secondary | ICD-10-CM | POA: Diagnosis not present

## 2014-01-30 DIAGNOSIS — N186 End stage renal disease: Secondary | ICD-10-CM | POA: Diagnosis not present

## 2014-01-30 DIAGNOSIS — N2581 Secondary hyperparathyroidism of renal origin: Secondary | ICD-10-CM | POA: Diagnosis not present

## 2014-01-30 DIAGNOSIS — D631 Anemia in chronic kidney disease: Secondary | ICD-10-CM | POA: Diagnosis not present

## 2014-02-01 DIAGNOSIS — N186 End stage renal disease: Secondary | ICD-10-CM | POA: Diagnosis not present

## 2014-02-01 DIAGNOSIS — Z992 Dependence on renal dialysis: Secondary | ICD-10-CM | POA: Diagnosis not present

## 2014-02-04 DIAGNOSIS — N186 End stage renal disease: Secondary | ICD-10-CM | POA: Diagnosis not present

## 2014-02-04 DIAGNOSIS — D631 Anemia in chronic kidney disease: Secondary | ICD-10-CM | POA: Diagnosis not present

## 2014-02-04 DIAGNOSIS — D509 Iron deficiency anemia, unspecified: Secondary | ICD-10-CM | POA: Diagnosis not present

## 2014-02-04 DIAGNOSIS — E119 Type 2 diabetes mellitus without complications: Secondary | ICD-10-CM | POA: Diagnosis not present

## 2014-02-04 DIAGNOSIS — N2581 Secondary hyperparathyroidism of renal origin: Secondary | ICD-10-CM | POA: Diagnosis not present

## 2014-02-06 DIAGNOSIS — D509 Iron deficiency anemia, unspecified: Secondary | ICD-10-CM | POA: Diagnosis not present

## 2014-02-06 DIAGNOSIS — N2581 Secondary hyperparathyroidism of renal origin: Secondary | ICD-10-CM | POA: Diagnosis not present

## 2014-02-06 DIAGNOSIS — E119 Type 2 diabetes mellitus without complications: Secondary | ICD-10-CM | POA: Diagnosis not present

## 2014-02-06 DIAGNOSIS — D631 Anemia in chronic kidney disease: Secondary | ICD-10-CM | POA: Diagnosis not present

## 2014-02-06 DIAGNOSIS — N186 End stage renal disease: Secondary | ICD-10-CM | POA: Diagnosis not present

## 2014-02-09 DIAGNOSIS — D509 Iron deficiency anemia, unspecified: Secondary | ICD-10-CM | POA: Diagnosis not present

## 2014-02-09 DIAGNOSIS — N2581 Secondary hyperparathyroidism of renal origin: Secondary | ICD-10-CM | POA: Diagnosis not present

## 2014-02-09 DIAGNOSIS — E119 Type 2 diabetes mellitus without complications: Secondary | ICD-10-CM | POA: Diagnosis not present

## 2014-02-09 DIAGNOSIS — D631 Anemia in chronic kidney disease: Secondary | ICD-10-CM | POA: Diagnosis not present

## 2014-02-09 DIAGNOSIS — N186 End stage renal disease: Secondary | ICD-10-CM | POA: Diagnosis not present

## 2014-02-10 ENCOUNTER — Ambulatory Visit: Payer: Medicare Other | Admitting: Pulmonary Disease

## 2014-02-11 DIAGNOSIS — E119 Type 2 diabetes mellitus without complications: Secondary | ICD-10-CM | POA: Diagnosis not present

## 2014-02-11 DIAGNOSIS — D509 Iron deficiency anemia, unspecified: Secondary | ICD-10-CM | POA: Diagnosis not present

## 2014-02-11 DIAGNOSIS — N2581 Secondary hyperparathyroidism of renal origin: Secondary | ICD-10-CM | POA: Diagnosis not present

## 2014-02-11 DIAGNOSIS — D631 Anemia in chronic kidney disease: Secondary | ICD-10-CM | POA: Diagnosis not present

## 2014-02-11 DIAGNOSIS — N186 End stage renal disease: Secondary | ICD-10-CM | POA: Diagnosis not present

## 2014-02-13 DIAGNOSIS — N186 End stage renal disease: Secondary | ICD-10-CM | POA: Diagnosis not present

## 2014-02-13 DIAGNOSIS — N2581 Secondary hyperparathyroidism of renal origin: Secondary | ICD-10-CM | POA: Diagnosis not present

## 2014-02-13 DIAGNOSIS — E119 Type 2 diabetes mellitus without complications: Secondary | ICD-10-CM | POA: Diagnosis not present

## 2014-02-13 DIAGNOSIS — D509 Iron deficiency anemia, unspecified: Secondary | ICD-10-CM | POA: Diagnosis not present

## 2014-02-13 DIAGNOSIS — D631 Anemia in chronic kidney disease: Secondary | ICD-10-CM | POA: Diagnosis not present

## 2014-02-16 DIAGNOSIS — N2581 Secondary hyperparathyroidism of renal origin: Secondary | ICD-10-CM | POA: Diagnosis not present

## 2014-02-16 DIAGNOSIS — E119 Type 2 diabetes mellitus without complications: Secondary | ICD-10-CM | POA: Diagnosis not present

## 2014-02-16 DIAGNOSIS — D631 Anemia in chronic kidney disease: Secondary | ICD-10-CM | POA: Diagnosis not present

## 2014-02-16 DIAGNOSIS — N186 End stage renal disease: Secondary | ICD-10-CM | POA: Diagnosis not present

## 2014-02-16 DIAGNOSIS — D509 Iron deficiency anemia, unspecified: Secondary | ICD-10-CM | POA: Diagnosis not present

## 2014-02-18 DIAGNOSIS — D631 Anemia in chronic kidney disease: Secondary | ICD-10-CM | POA: Diagnosis not present

## 2014-02-18 DIAGNOSIS — N186 End stage renal disease: Secondary | ICD-10-CM | POA: Diagnosis not present

## 2014-02-18 DIAGNOSIS — D509 Iron deficiency anemia, unspecified: Secondary | ICD-10-CM | POA: Diagnosis not present

## 2014-02-18 DIAGNOSIS — E119 Type 2 diabetes mellitus without complications: Secondary | ICD-10-CM | POA: Diagnosis not present

## 2014-02-18 DIAGNOSIS — N2581 Secondary hyperparathyroidism of renal origin: Secondary | ICD-10-CM | POA: Diagnosis not present

## 2014-02-20 DIAGNOSIS — D631 Anemia in chronic kidney disease: Secondary | ICD-10-CM | POA: Diagnosis not present

## 2014-02-20 DIAGNOSIS — N186 End stage renal disease: Secondary | ICD-10-CM | POA: Diagnosis not present

## 2014-02-20 DIAGNOSIS — N2581 Secondary hyperparathyroidism of renal origin: Secondary | ICD-10-CM | POA: Diagnosis not present

## 2014-02-20 DIAGNOSIS — D509 Iron deficiency anemia, unspecified: Secondary | ICD-10-CM | POA: Diagnosis not present

## 2014-02-20 DIAGNOSIS — E119 Type 2 diabetes mellitus without complications: Secondary | ICD-10-CM | POA: Diagnosis not present

## 2014-02-23 DIAGNOSIS — E119 Type 2 diabetes mellitus without complications: Secondary | ICD-10-CM | POA: Diagnosis not present

## 2014-02-23 DIAGNOSIS — D509 Iron deficiency anemia, unspecified: Secondary | ICD-10-CM | POA: Diagnosis not present

## 2014-02-23 DIAGNOSIS — D631 Anemia in chronic kidney disease: Secondary | ICD-10-CM | POA: Diagnosis not present

## 2014-02-23 DIAGNOSIS — N186 End stage renal disease: Secondary | ICD-10-CM | POA: Diagnosis not present

## 2014-02-23 DIAGNOSIS — N2581 Secondary hyperparathyroidism of renal origin: Secondary | ICD-10-CM | POA: Diagnosis not present

## 2014-02-25 DIAGNOSIS — E119 Type 2 diabetes mellitus without complications: Secondary | ICD-10-CM | POA: Diagnosis not present

## 2014-02-25 DIAGNOSIS — N186 End stage renal disease: Secondary | ICD-10-CM | POA: Diagnosis not present

## 2014-02-25 DIAGNOSIS — N2581 Secondary hyperparathyroidism of renal origin: Secondary | ICD-10-CM | POA: Diagnosis not present

## 2014-02-25 DIAGNOSIS — D631 Anemia in chronic kidney disease: Secondary | ICD-10-CM | POA: Diagnosis not present

## 2014-02-25 DIAGNOSIS — D509 Iron deficiency anemia, unspecified: Secondary | ICD-10-CM | POA: Diagnosis not present

## 2014-02-27 DIAGNOSIS — E119 Type 2 diabetes mellitus without complications: Secondary | ICD-10-CM | POA: Diagnosis not present

## 2014-02-27 DIAGNOSIS — N2581 Secondary hyperparathyroidism of renal origin: Secondary | ICD-10-CM | POA: Diagnosis not present

## 2014-02-27 DIAGNOSIS — D631 Anemia in chronic kidney disease: Secondary | ICD-10-CM | POA: Diagnosis not present

## 2014-02-27 DIAGNOSIS — D509 Iron deficiency anemia, unspecified: Secondary | ICD-10-CM | POA: Diagnosis not present

## 2014-02-27 DIAGNOSIS — N186 End stage renal disease: Secondary | ICD-10-CM | POA: Diagnosis not present

## 2014-03-02 DIAGNOSIS — D509 Iron deficiency anemia, unspecified: Secondary | ICD-10-CM | POA: Diagnosis not present

## 2014-03-02 DIAGNOSIS — D631 Anemia in chronic kidney disease: Secondary | ICD-10-CM | POA: Diagnosis not present

## 2014-03-02 DIAGNOSIS — N186 End stage renal disease: Secondary | ICD-10-CM | POA: Diagnosis not present

## 2014-03-02 DIAGNOSIS — N2581 Secondary hyperparathyroidism of renal origin: Secondary | ICD-10-CM | POA: Diagnosis not present

## 2014-03-02 DIAGNOSIS — E119 Type 2 diabetes mellitus without complications: Secondary | ICD-10-CM | POA: Diagnosis not present

## 2014-03-02 DIAGNOSIS — Z992 Dependence on renal dialysis: Secondary | ICD-10-CM | POA: Diagnosis not present

## 2014-03-04 DIAGNOSIS — D631 Anemia in chronic kidney disease: Secondary | ICD-10-CM | POA: Diagnosis not present

## 2014-03-04 DIAGNOSIS — N186 End stage renal disease: Secondary | ICD-10-CM | POA: Diagnosis not present

## 2014-03-04 DIAGNOSIS — E119 Type 2 diabetes mellitus without complications: Secondary | ICD-10-CM | POA: Diagnosis not present

## 2014-03-04 DIAGNOSIS — N2581 Secondary hyperparathyroidism of renal origin: Secondary | ICD-10-CM | POA: Diagnosis not present

## 2014-03-04 DIAGNOSIS — D509 Iron deficiency anemia, unspecified: Secondary | ICD-10-CM | POA: Diagnosis not present

## 2014-03-06 DIAGNOSIS — D509 Iron deficiency anemia, unspecified: Secondary | ICD-10-CM | POA: Diagnosis not present

## 2014-03-06 DIAGNOSIS — N2581 Secondary hyperparathyroidism of renal origin: Secondary | ICD-10-CM | POA: Diagnosis not present

## 2014-03-06 DIAGNOSIS — D631 Anemia in chronic kidney disease: Secondary | ICD-10-CM | POA: Diagnosis not present

## 2014-03-06 DIAGNOSIS — E119 Type 2 diabetes mellitus without complications: Secondary | ICD-10-CM | POA: Diagnosis not present

## 2014-03-06 DIAGNOSIS — N186 End stage renal disease: Secondary | ICD-10-CM | POA: Diagnosis not present

## 2014-03-09 DIAGNOSIS — N186 End stage renal disease: Secondary | ICD-10-CM | POA: Diagnosis not present

## 2014-03-09 DIAGNOSIS — E119 Type 2 diabetes mellitus without complications: Secondary | ICD-10-CM | POA: Diagnosis not present

## 2014-03-09 DIAGNOSIS — N2581 Secondary hyperparathyroidism of renal origin: Secondary | ICD-10-CM | POA: Diagnosis not present

## 2014-03-09 DIAGNOSIS — D509 Iron deficiency anemia, unspecified: Secondary | ICD-10-CM | POA: Diagnosis not present

## 2014-03-09 DIAGNOSIS — D631 Anemia in chronic kidney disease: Secondary | ICD-10-CM | POA: Diagnosis not present

## 2014-03-11 DIAGNOSIS — D509 Iron deficiency anemia, unspecified: Secondary | ICD-10-CM | POA: Diagnosis not present

## 2014-03-11 DIAGNOSIS — N2581 Secondary hyperparathyroidism of renal origin: Secondary | ICD-10-CM | POA: Diagnosis not present

## 2014-03-11 DIAGNOSIS — D631 Anemia in chronic kidney disease: Secondary | ICD-10-CM | POA: Diagnosis not present

## 2014-03-11 DIAGNOSIS — N186 End stage renal disease: Secondary | ICD-10-CM | POA: Diagnosis not present

## 2014-03-11 DIAGNOSIS — E119 Type 2 diabetes mellitus without complications: Secondary | ICD-10-CM | POA: Diagnosis not present

## 2014-03-13 DIAGNOSIS — D631 Anemia in chronic kidney disease: Secondary | ICD-10-CM | POA: Diagnosis not present

## 2014-03-13 DIAGNOSIS — E119 Type 2 diabetes mellitus without complications: Secondary | ICD-10-CM | POA: Diagnosis not present

## 2014-03-13 DIAGNOSIS — N2581 Secondary hyperparathyroidism of renal origin: Secondary | ICD-10-CM | POA: Diagnosis not present

## 2014-03-13 DIAGNOSIS — N186 End stage renal disease: Secondary | ICD-10-CM | POA: Diagnosis not present

## 2014-03-13 DIAGNOSIS — D509 Iron deficiency anemia, unspecified: Secondary | ICD-10-CM | POA: Diagnosis not present

## 2014-03-16 DIAGNOSIS — N186 End stage renal disease: Secondary | ICD-10-CM | POA: Diagnosis not present

## 2014-03-16 DIAGNOSIS — D631 Anemia in chronic kidney disease: Secondary | ICD-10-CM | POA: Diagnosis not present

## 2014-03-16 DIAGNOSIS — E119 Type 2 diabetes mellitus without complications: Secondary | ICD-10-CM | POA: Diagnosis not present

## 2014-03-16 DIAGNOSIS — N2581 Secondary hyperparathyroidism of renal origin: Secondary | ICD-10-CM | POA: Diagnosis not present

## 2014-03-16 DIAGNOSIS — D509 Iron deficiency anemia, unspecified: Secondary | ICD-10-CM | POA: Diagnosis not present

## 2014-03-18 DIAGNOSIS — E119 Type 2 diabetes mellitus without complications: Secondary | ICD-10-CM | POA: Diagnosis not present

## 2014-03-18 DIAGNOSIS — D509 Iron deficiency anemia, unspecified: Secondary | ICD-10-CM | POA: Diagnosis not present

## 2014-03-18 DIAGNOSIS — N2581 Secondary hyperparathyroidism of renal origin: Secondary | ICD-10-CM | POA: Diagnosis not present

## 2014-03-18 DIAGNOSIS — N186 End stage renal disease: Secondary | ICD-10-CM | POA: Diagnosis not present

## 2014-03-18 DIAGNOSIS — D631 Anemia in chronic kidney disease: Secondary | ICD-10-CM | POA: Diagnosis not present

## 2014-03-20 DIAGNOSIS — E119 Type 2 diabetes mellitus without complications: Secondary | ICD-10-CM | POA: Diagnosis not present

## 2014-03-20 DIAGNOSIS — N186 End stage renal disease: Secondary | ICD-10-CM | POA: Diagnosis not present

## 2014-03-20 DIAGNOSIS — D509 Iron deficiency anemia, unspecified: Secondary | ICD-10-CM | POA: Diagnosis not present

## 2014-03-20 DIAGNOSIS — D631 Anemia in chronic kidney disease: Secondary | ICD-10-CM | POA: Diagnosis not present

## 2014-03-20 DIAGNOSIS — N2581 Secondary hyperparathyroidism of renal origin: Secondary | ICD-10-CM | POA: Diagnosis not present

## 2014-03-23 DIAGNOSIS — D631 Anemia in chronic kidney disease: Secondary | ICD-10-CM | POA: Diagnosis not present

## 2014-03-23 DIAGNOSIS — N2581 Secondary hyperparathyroidism of renal origin: Secondary | ICD-10-CM | POA: Diagnosis not present

## 2014-03-23 DIAGNOSIS — D509 Iron deficiency anemia, unspecified: Secondary | ICD-10-CM | POA: Diagnosis not present

## 2014-03-23 DIAGNOSIS — N186 End stage renal disease: Secondary | ICD-10-CM | POA: Diagnosis not present

## 2014-03-23 DIAGNOSIS — E119 Type 2 diabetes mellitus without complications: Secondary | ICD-10-CM | POA: Diagnosis not present

## 2014-03-25 DIAGNOSIS — D631 Anemia in chronic kidney disease: Secondary | ICD-10-CM | POA: Diagnosis not present

## 2014-03-25 DIAGNOSIS — E119 Type 2 diabetes mellitus without complications: Secondary | ICD-10-CM | POA: Diagnosis not present

## 2014-03-25 DIAGNOSIS — N186 End stage renal disease: Secondary | ICD-10-CM | POA: Diagnosis not present

## 2014-03-25 DIAGNOSIS — N2581 Secondary hyperparathyroidism of renal origin: Secondary | ICD-10-CM | POA: Diagnosis not present

## 2014-03-25 DIAGNOSIS — D509 Iron deficiency anemia, unspecified: Secondary | ICD-10-CM | POA: Diagnosis not present

## 2014-03-26 ENCOUNTER — Telehealth: Payer: Self-pay | Admitting: Pulmonary Disease

## 2014-03-26 ENCOUNTER — Ambulatory Visit (INDEPENDENT_AMBULATORY_CARE_PROVIDER_SITE_OTHER): Payer: Medicare Other | Admitting: Pulmonary Disease

## 2014-03-26 ENCOUNTER — Other Ambulatory Visit (INDEPENDENT_AMBULATORY_CARE_PROVIDER_SITE_OTHER): Payer: Medicare Other

## 2014-03-26 ENCOUNTER — Encounter: Payer: Self-pay | Admitting: Pulmonary Disease

## 2014-03-26 VITALS — BP 118/64 | HR 64 | Ht 62.5 in | Wt 163.0 lb

## 2014-03-26 DIAGNOSIS — I270 Primary pulmonary hypertension: Secondary | ICD-10-CM | POA: Diagnosis not present

## 2014-03-26 DIAGNOSIS — F17200 Nicotine dependence, unspecified, uncomplicated: Secondary | ICD-10-CM

## 2014-03-26 DIAGNOSIS — I272 Pulmonary hypertension, unspecified: Secondary | ICD-10-CM

## 2014-03-26 DIAGNOSIS — Z720 Tobacco use: Secondary | ICD-10-CM

## 2014-03-26 DIAGNOSIS — I5032 Chronic diastolic (congestive) heart failure: Secondary | ICD-10-CM | POA: Diagnosis not present

## 2014-03-26 DIAGNOSIS — R5383 Other fatigue: Secondary | ICD-10-CM

## 2014-03-26 DIAGNOSIS — R0602 Shortness of breath: Secondary | ICD-10-CM | POA: Diagnosis not present

## 2014-03-26 LAB — HEPATIC FUNCTION PANEL
ALBUMIN: 4.1 g/dL (ref 3.5–5.2)
ALT: 9 U/L (ref 0–35)
AST: 15 U/L (ref 0–37)
Alkaline Phosphatase: 163 U/L — ABNORMAL HIGH (ref 39–117)
Bilirubin, Direct: 0.1 mg/dL (ref 0.0–0.3)
TOTAL PROTEIN: 7.7 g/dL (ref 6.0–8.3)
Total Bilirubin: 0.5 mg/dL (ref 0.2–1.2)

## 2014-03-26 LAB — TSH: TSH: 2.29 u[IU]/mL (ref 0.35–4.50)

## 2014-03-26 NOTE — Assessment & Plan Note (Signed)
Advised to quit.  

## 2014-03-26 NOTE — Telephone Encounter (Signed)
refaxed letairis application form.

## 2014-03-26 NOTE — Assessment & Plan Note (Signed)
Will avoid PDE-5s given her intermittent nitrate use.

## 2014-03-26 NOTE — Assessment & Plan Note (Addendum)
[name] has evidence of pulmonary hypertension in the setting of a normal pulmonary capillary wedge pressure despite a slightly decreased LVEF on her echocardiogram.  Because of a progression in dyspnea and in an effort to prevent worsening right heart failure I think we need to treat her pulmonary hypertension.  Because of her nitrate use and studies showing clinical deterioration in patient with systolic heart failure treated with PDE-5's, I would prefer to use a once daily ERA.    Plan: -start Letairis 5mg , we will monitor LFTs closely and I have advised her to watch out for hypotension on dialysis -6 min walk now and q21months

## 2014-03-26 NOTE — Assessment & Plan Note (Signed)
Appears euvolemic on exam today.  Plan: Continue current management

## 2014-03-26 NOTE — Progress Notes (Signed)
Subjective:    Patient ID: [name], female    DOB: 1945/03/30, 69 y.o.   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  PFTs normal.  Active smoker. Secondary Pulmonary hypertension, ESRD, CHF 04/08/2013 full pulmonary function test> ratio 77%, FEV1 1.44 L (80% predicted), total lung capacity 3.55 (72% predicted), ERV 0.23 L (129% predicted), DLCO 10.95 (47% predicted) 05/2013 6MW   HPI  Chief Complaint  Patient presents with  . Follow-up    Pt c/o DOE, sinus congestion related to pollen.     [name] says she has been great but she still notes some dyspnea when she walks 20-30 minutes at a time.  She does not have to stop.  She is cougihng a little bit with the pollen.  She says that the pollen is giving her sinus congestion, no chest pain or fever or chills.  She notes that she used to use nitroglycerin but she hasn't used it in 2 years.  In general she is feeling well.  She denies drinking alcohol.  She has been compliant with her dialysis.  Past Medical History  Diagnosis Date  . CAD (coronary artery disease)     stent to RCA  . CVA (cerebral infarction) 2003    no apparent residual  . Hypothyroidism   . PVD (peripheral vascular disease)   . Hyperlipidemia   . Positive PPD     completed rifampin  . Diastolic congestive heart failure   . Hypertension   . COPD (chronic obstructive pulmonary disease) t  . Cancer     clear cell cancer, kidney  . Complication of anesthesia 12/2010    pt is very confused, with AMS with anesthesia  . CVA 07/27/2008    CVA affected cognition and memory per family, no focal deficits.    . ESRD 07/27/2008    ESRD due to HTN and NSAID's, started hemodialysis in 2005 in [place], [place]. Went to [facility] from 2010 to 2012 and since 2012 has been getting dialysis at [facility] on [address] in [place] on a MWF schedule. First access with RUA AVG placed in [place]. Next and current access was LUA  AVG placed by Dr. Lucky Cowboy in [place] in or around 2012. Has had 2 or 3 procedures on that graft since placed per family. She gets her access work done here in [place] now. She is allergic to heparin and does not get any heparin at dialysis; she had an allergic reaction apparently when in ICU in the past.      . Encephalopathy   . Seizures   . Dyslipidemia   . Chronic renal insufficiency     On hemodialysis  . Arthritis of shoulder     Bilateral  . Depression      Review of Systems  Constitutional: Negative for fever, chills and fatigue.  HENT: Negative for rhinorrhea, sinus pressure and sneezing.   Respiratory: Positive for cough and shortness of breath. Negative for wheezing.   Cardiovascular: Negative for chest pain, palpitations and leg swelling.       Objective:   Physical Exam  Filed Vitals:   03/26/14 0927  BP: 118/64  Pulse: 64  Height: 5' 2.5" (1.588 m)  Weight: 163 lb (73.936 kg)  SpO2: 100%   RA  Gen: well appearing HEENT: NCAT, OP clear PULM: CTA B CV: RRR, systolic murmr I/VI RUSB, JVD noted AB: BS+, soft Ext: warm, no edema, clubbing noted Neuro: A&Ox4, maew  05/2013 6MW> 240 meters, O2 saturaiton 99% exercise 01/29/2014 RHC> RA mean 1 RV 52/1 PA 52/20, mean 30 PCWP mean 6  Oxygen saturations: PA 70% AO 95%  Cardiac Output (Fick) 5.77  Cardiac Index (Fick) 3.32 PVR 4.2 WU  Cardiac Output (Thermo) 5.12 Cardiac Index (Thermo) 2.94 PVR 4.7 WU     Assessment & Plan:   Pulmonary hypertension [name] has evidence of pulmonary hypertension in the setting of a normal pulmonary capillary wedge pressure despite a slightly decreased LVEF on her echocardiogram.  Because of a progression in dyspnea and in an effort to prevent worsening right heart failure I think we need to treat her pulmonary hypertension.  Because of her nitrate use and studies showing clinical deterioration in patient with systolic heart failure treated with PDE-5's, I would  prefer to use a once daily ERA.    Plan: -start Letairis 5mg , we will monitor LFTs closely and I have advised her to watch out for hypotension on dialysis -6 min walk now and q65months   Chronic diastolic heart failure Appears euvolemic on exam today.  Plan: Continue current management   CAD Will avoid PDE-5s given her intermittent nitrate use.   TOBACCO USER Advised to quit     Updated Medication List Outpatient Encounter Prescriptions as of 03/26/2014  Medication Sig  . acetaminophen (TYLENOL) 500 MG tablet Take 500 mg by mouth every 6 (six) hours as needed for pain.  [name] acyclovir (ZOVIRAX) 200 MG capsule Take 200 mg by mouth daily as needed (shingles).  [name] albuterol (PROVENTIL HFA;VENTOLIN HFA) 108 (90 BASE) MCG/ACT inhaler Inhale 1-2 puffs into the lungs every 6 (six) hours as needed for wheezing or shortness of breath. (Patient not taking: Reported on 03/26/2014)  . amLODipine (NORVASC) 10 MG tablet Take 10 mg by mouth at bedtime.   [name] aspirin EC 81 MG tablet Take 81 mg by mouth daily.  . carvedilol (COREG) 25 MG tablet Take 25 mg by mouth 2 (two) times daily with a meal.  . cinacalcet (SENSIPAR) 60 MG tablet Take 60 mg by mouth at bedtime.  . hydrALAZINE (APRESOLINE) 50 MG tablet Take 50 mg by mouth 3 (three) times daily.  . hydrOXYzine (ATARAX/VISTARIL) 10 MG tablet Take 10 mg by mouth daily as needed (every morning on dialysis days).   . isosorbide mononitrate (IMDUR) 30 MG 24 hr tablet Take 30 mg by mouth daily.  [name] levETIRAcetam (KEPPRA) 500 MG tablet Take 500 mg by mouth daily at 8 pm.  . levothyroxine (SYNTHROID, LEVOTHROID) 88 MCG tablet Take 88 mcg by mouth daily before breakfast.  . loperamide (IMODIUM A-D) 2 MG tablet Take 2 mg by mouth 4 (four) times daily as needed for diarrhea or loose stools.  [name] loratadine (CLARITIN) 10 MG tablet Take 10 mg by mouth daily as needed for allergies.  . multivitamin (RENA-VIT) TABS tablet Take 1 tablet by mouth daily.  [name] NITROSTAT  0.4 MG SL tablet Place 0.4 mg under the tongue every 5 (five) minutes as needed for chest pain.   [name] omeprazole (PRILOSEC OTC) 20 MG tablet Take 20 mg by mouth daily.  . ondansetron (ZOFRAN ODT) 8 MG disintegrating tablet Take 1 tablet (8 mg total) by mouth every 8 (eight) hours as needed for nausea or vomiting.  . pravastatin (PRAVACHOL) 40 MG tablet Take 40 mg by mouth daily.  . sevelamer carbonate (RENVELA) 800 MG tablet Take 1,600-3,200 mg by mouth 3 (three) times daily. 4 tabs TID, 2 tabs with each snack  .  traZODone (DESYREL) 50 MG tablet Take 50 mg by mouth at bedtime as needed for sleep.   . [DISCONTINUED] benzonatate (TESSALON) 100 MG capsule Take 1 capsule (100 mg total) by mouth every 8 (eight) hours. (Patient not taking: Reported on 12/11/2013)  . [DISCONTINUED] guaiFENesin (MUCINEX) 600 MG 12 hr tablet Take 2 tablets (1,200 mg total) by mouth 2 (two) times daily.  . [DISCONTINUED] hydrocortisone cream 1 % Apply 1 application topically daily as needed for itching.

## 2014-03-26 NOTE — Patient Instructions (Signed)
We will arrange another 6 minute walk test We will need to arrange a V/Q scan for you to make sure you don't have blood clots in your lungs We will have you start taking Letairis, this will involve you taking a pill once a day and you wil need to have blood work for a few months We will see you back in 6 weeks or sooner if needed

## 2014-03-26 NOTE — Telephone Encounter (Signed)
Sarah Pina, do you still have the forms? Please advise, thanks!

## 2014-03-26 NOTE — Addendum Note (Signed)
Addended by: Len Blalock on: 03/26/2014 10:53 AM   Modules accepted: Orders

## 2014-03-27 DIAGNOSIS — N186 End stage renal disease: Secondary | ICD-10-CM | POA: Diagnosis not present

## 2014-03-27 DIAGNOSIS — E119 Type 2 diabetes mellitus without complications: Secondary | ICD-10-CM | POA: Diagnosis not present

## 2014-03-27 DIAGNOSIS — N2581 Secondary hyperparathyroidism of renal origin: Secondary | ICD-10-CM | POA: Diagnosis not present

## 2014-03-27 DIAGNOSIS — D631 Anemia in chronic kidney disease: Secondary | ICD-10-CM | POA: Diagnosis not present

## 2014-03-27 DIAGNOSIS — D509 Iron deficiency anemia, unspecified: Secondary | ICD-10-CM | POA: Diagnosis not present

## 2014-03-27 LAB — CENTROMERE ANTIBODIES: Centromere Ab Screen: <1

## 2014-03-27 LAB — HIV ANTIBODY (ROUTINE TESTING W REFLEX): HIV 1&2 Ab, 4th Generation: NONREACTIVE

## 2014-03-27 LAB — ANTI-SCLERODERMA ANTIBODY: SCLERODERMA (SCL-70) (ENA) ANTIBODY, IGG: NEGATIVE

## 2014-03-27 LAB — ANA: Anti Nuclear Antibody(ANA): NEGATIVE

## 2014-03-27 LAB — ANTI-DNA ANTIBODY, DOUBLE-STRANDED: ds DNA Ab: <1 IU/mL

## 2014-03-27 LAB — RNP ANTIBODY: RIBONUCLEIC PROTEIN(ENA) ANTIBODY, IGG: NEGATIVE

## 2014-03-30 DIAGNOSIS — E119 Type 2 diabetes mellitus without complications: Secondary | ICD-10-CM | POA: Diagnosis not present

## 2014-03-30 DIAGNOSIS — N2581 Secondary hyperparathyroidism of renal origin: Secondary | ICD-10-CM | POA: Diagnosis not present

## 2014-03-30 DIAGNOSIS — N186 End stage renal disease: Secondary | ICD-10-CM | POA: Diagnosis not present

## 2014-03-30 DIAGNOSIS — D509 Iron deficiency anemia, unspecified: Secondary | ICD-10-CM | POA: Diagnosis not present

## 2014-03-30 DIAGNOSIS — D631 Anemia in chronic kidney disease: Secondary | ICD-10-CM | POA: Diagnosis not present

## 2014-03-30 LAB — BETA-2 GLYCOPROTEIN I AB,G/M: Beta-2 Glyco 1 IgM: <9 GPI IgM units (ref 0–32)

## 2014-03-31 ENCOUNTER — Telehealth: Payer: Self-pay | Admitting: Pulmonary Disease

## 2014-03-31 NOTE — Progress Notes (Signed)
Quick Note:  Called and spoke to pt. Informed pt of the results per BQ. Pt verbalized understanding and denied any further questions or concerns at this time.   ______

## 2014-03-31 NOTE — Telephone Encounter (Signed)
Caryl Pina- do you still have this form? Please advise, thanks!

## 2014-04-01 DIAGNOSIS — E119 Type 2 diabetes mellitus without complications: Secondary | ICD-10-CM | POA: Diagnosis not present

## 2014-04-01 DIAGNOSIS — N2581 Secondary hyperparathyroidism of renal origin: Secondary | ICD-10-CM | POA: Diagnosis not present

## 2014-04-01 DIAGNOSIS — D509 Iron deficiency anemia, unspecified: Secondary | ICD-10-CM | POA: Diagnosis not present

## 2014-04-01 DIAGNOSIS — D631 Anemia in chronic kidney disease: Secondary | ICD-10-CM | POA: Diagnosis not present

## 2014-04-01 DIAGNOSIS — N186 End stage renal disease: Secondary | ICD-10-CM | POA: Diagnosis not present

## 2014-04-02 DIAGNOSIS — N186 End stage renal disease: Secondary | ICD-10-CM | POA: Diagnosis not present

## 2014-04-02 DIAGNOSIS — Z992 Dependence on renal dialysis: Secondary | ICD-10-CM | POA: Diagnosis not present

## 2014-04-02 DIAGNOSIS — E1129 Type 2 diabetes mellitus with other diabetic kidney complication: Secondary | ICD-10-CM | POA: Diagnosis not present

## 2014-04-02 NOTE — Telephone Encounter (Signed)
This has been refaxed upside down and is being held in case they still do not receive the signature.  Nothing further needed at this time.

## 2014-04-03 ENCOUNTER — Telehealth: Payer: Self-pay | Admitting: Pulmonary Disease

## 2014-04-03 DIAGNOSIS — D631 Anemia in chronic kidney disease: Secondary | ICD-10-CM | POA: Diagnosis not present

## 2014-04-03 DIAGNOSIS — N186 End stage renal disease: Secondary | ICD-10-CM | POA: Diagnosis not present

## 2014-04-03 DIAGNOSIS — D509 Iron deficiency anemia, unspecified: Secondary | ICD-10-CM | POA: Diagnosis not present

## 2014-04-03 DIAGNOSIS — E119 Type 2 diabetes mellitus without complications: Secondary | ICD-10-CM | POA: Diagnosis not present

## 2014-04-03 DIAGNOSIS — N2581 Secondary hyperparathyroidism of renal origin: Secondary | ICD-10-CM | POA: Diagnosis not present

## 2014-04-03 NOTE — Telephone Encounter (Signed)
lmtcb X1 for [employer].

## 2014-04-06 DIAGNOSIS — D631 Anemia in chronic kidney disease: Secondary | ICD-10-CM | POA: Diagnosis not present

## 2014-04-06 DIAGNOSIS — N186 End stage renal disease: Secondary | ICD-10-CM | POA: Diagnosis not present

## 2014-04-06 DIAGNOSIS — D509 Iron deficiency anemia, unspecified: Secondary | ICD-10-CM | POA: Diagnosis not present

## 2014-04-06 DIAGNOSIS — E119 Type 2 diabetes mellitus without complications: Secondary | ICD-10-CM | POA: Diagnosis not present

## 2014-04-06 DIAGNOSIS — N2581 Secondary hyperparathyroidism of renal origin: Secondary | ICD-10-CM | POA: Diagnosis not present

## 2014-04-06 NOTE — Telephone Encounter (Signed)
lmtcb for [name]

## 2014-04-07 NOTE — Telephone Encounter (Signed)
Form received and given to [facility] .  Will forward back to her for f/u

## 2014-04-07 NOTE — Telephone Encounter (Signed)
Spoke with Caryl Pina at [employer].  She is refaxing form to Triage attn to [place].  Will forward to [place] to f/u on .

## 2014-04-08 ENCOUNTER — Telehealth: Payer: Self-pay

## 2014-04-08 DIAGNOSIS — D509 Iron deficiency anemia, unspecified: Secondary | ICD-10-CM | POA: Diagnosis not present

## 2014-04-08 DIAGNOSIS — N186 End stage renal disease: Secondary | ICD-10-CM | POA: Diagnosis not present

## 2014-04-08 DIAGNOSIS — D631 Anemia in chronic kidney disease: Secondary | ICD-10-CM | POA: Diagnosis not present

## 2014-04-08 DIAGNOSIS — N2581 Secondary hyperparathyroidism of renal origin: Secondary | ICD-10-CM | POA: Diagnosis not present

## 2014-04-08 DIAGNOSIS — E119 Type 2 diabetes mellitus without complications: Secondary | ICD-10-CM | POA: Diagnosis not present

## 2014-04-08 NOTE — Telephone Encounter (Signed)
lmtcb X1 to make pt aware.  

## 2014-04-08 NOTE — Telephone Encounter (Signed)
-----   Message from Juanito Doom, MD sent at 04/08/2014  3:18 AM EDT ----- A, Please let her know that her blood work was [employer] B

## 2014-04-09 ENCOUNTER — Ambulatory Visit (INDEPENDENT_AMBULATORY_CARE_PROVIDER_SITE_OTHER): Payer: Medicare Other | Admitting: Cardiology

## 2014-04-09 ENCOUNTER — Encounter: Payer: Self-pay | Admitting: Cardiology

## 2014-04-09 VITALS — BP 100/58 | HR 68 | Ht 62.0 in | Wt 163.4 lb

## 2014-04-09 DIAGNOSIS — I2510 Atherosclerotic heart disease of native coronary artery without angina pectoris: Secondary | ICD-10-CM | POA: Diagnosis not present

## 2014-04-09 DIAGNOSIS — I5032 Chronic diastolic (congestive) heart failure: Secondary | ICD-10-CM

## 2014-04-09 NOTE — Progress Notes (Signed)
HPI Patient presents for followup of her nonobsructive coronary disease. She has end-stage renal disease and is on dialysis. She has had an extensive workup for dyspnea and this included a recent echocardiogram. This demonstrated pulmonary pressure to be 107 with an EF of 35-40%. She did have an evaluation in our office for consideration of an MRI because of a possible speckleding on echo. However, she could not have this done as she has some problems with claustrophobia. She had SPEP sent off. There was no protein spike on the SPEP.  Most recently earlier this year she had a right heart cath with a normal wedge.  She was going to be started on  Letairis but she did not get this yet..    She says that she feels great.  She walks the dog.  The patient denies any new symptoms such as chest discomfort, neck or arm discomfort. There has been no new shortness of breath, PND or orthopnea. There have been no reported palpitations, presyncope or syncope.   Allergies  Allergen Reactions  . Heparin Other (See Comments)    MDs told her not to take after reaction in ICU  . Iohexol Swelling and Other (See Comments)    1970s; passed out and had facial/tongue swelling.  Requires 13-hour prep with prednisone and benadryl  . Phenytoin Other (See Comments)    Had reaction while in ICU; doesn't know.  MDs told her not to take ever again.  . Wellbutrin [Bupropion] Other (See Comments)    seizures  . Ampicillin Hives and Rash  . Meperidine Hcl Swelling and Rash    Makes tongue swell  . Morphine Rash  . Penicillins Rash  . Valproic Acid And Related Other (See Comments)    Confusion   . Ace Inhibitors Rash  . Pentazocine Lactate Other (See Comments)    Patient does not remember reaction to this med ([place]).     Current Outpatient Prescriptions  Medication Sig Dispense Refill  . acetaminophen (TYLENOL) 500 MG tablet Take 500 mg by mouth every 6 (six) hours as needed for pain.    [name] acyclovir (ZOVIRAX)  200 MG capsule Take 200 mg by mouth daily as needed (shingles).    [name] albuterol (PROVENTIL HFA;VENTOLIN HFA) 108 (90 BASE) MCG/ACT inhaler Inhale 1-2 puffs into the lungs every 6 (six) hours as needed for wheezing or shortness of breath. 1 Inhaler 0  . amLODipine (NORVASC) 10 MG tablet Take 10 mg by mouth at bedtime.     [name] aspirin EC 81 MG tablet Take 81 mg by mouth daily.    . carvedilol (COREG) 25 MG tablet Take 25 mg by mouth 2 (two) times daily with a meal.    . hydrALAZINE (APRESOLINE) 50 MG tablet Take 50 mg by mouth 3 (three) times daily.    . hydrOXYzine (ATARAX/VISTARIL) 25 MG tablet Take 1 tablet by mouth daily as needed. On dialysis days  6  . isosorbide mononitrate (IMDUR) 30 MG 24 hr tablet Take 30 mg by mouth daily.    [name] levETIRAcetam (KEPPRA) 500 MG tablet Take 500 mg by mouth daily at 8 pm.    . levothyroxine (SYNTHROID, LEVOTHROID) 88 MCG tablet Take 88 mcg by mouth daily before breakfast.    . loperamide (IMODIUM A-D) 2 MG tablet Take 2 mg by mouth 4 (four) times daily as needed for diarrhea or loose stools.    [name] loratadine (CLARITIN) 10 MG tablet Take 10 mg by mouth daily as needed for allergies.    [name]  multivitamin (RENA-VIT) TABS tablet Take 1 tablet by mouth daily.    [name] NITROSTAT 0.4 MG SL tablet Place 0.4 mg under the tongue every 5 (five) minutes as needed for chest pain.     [name] omeprazole (PRILOSEC OTC) 20 MG tablet Take 20 mg by mouth daily.    . ondansetron (ZOFRAN ODT) 8 MG disintegrating tablet Take 1 tablet (8 mg total) by mouth every 8 (eight) hours as needed for nausea or vomiting. 20 tablet 0  . pravastatin (PRAVACHOL) 40 MG tablet Take 40 mg by mouth daily.    . SENSIPAR 90 MG tablet Take 1 tablet by mouth daily.    . sevelamer carbonate (RENVELA) 800 MG tablet Take 1,600-3,200 mg by mouth 3 (three) times daily. 4 tabs TID, 2 tabs with each snack    . traMADol (ULTRAM) 50 MG tablet Take 1 tablet by mouth daily as needed.    . traZODone (DESYREL) 50 MG tablet Take 50  mg by mouth at bedtime as needed for sleep.      No current facility-administered medications for this visit.    Past Medical History  Diagnosis Date  . CAD (coronary artery disease)     stent to RCA  . CVA (cerebral infarction) 2003    no apparent residual  . Hypothyroidism   . PVD (peripheral vascular disease)   . Hyperlipidemia   . Positive PPD     completed rifampin  . Diastolic congestive heart failure   . Hypertension   . COPD (chronic obstructive pulmonary disease) t  . Cancer     clear cell cancer, kidney  . Complication of anesthesia 12/2010    pt is very confused, with AMS with anesthesia  . CVA 07/27/2008    CVA affected cognition and memory per family, no focal deficits.    . ESRD 07/27/2008    ESRD due to HTN and NSAID's, started hemodialysis in 2005 in [place], [place]. Went to [facility] from 2010 to 2012 and since 2012 has been getting dialysis at [facility] on [address] in [place] on a MWF schedule. First access with RUA AVG placed in [place]. Next and current access was LUA AVG placed by Dr. Lucky Cowboy in [place] in or around 2012. Has had 2 or 3 procedures on that graft since placed per family. She gets her access work done here in [place] now. She is allergic to heparin and does not get any heparin at dialysis; she had an allergic reaction apparently when in ICU in the past.      . Encephalopathy   . Seizures   . Dyslipidemia   . Chronic renal insufficiency     On hemodialysis  . Arthritis of shoulder     Bilateral  . Depression     Past Surgical History  Procedure Laterality Date  . Abdominal hysterectomy    . Cholecystectomy    . D&cs    . Right ankle repair    . Left nephrectomy    . Vascular surgery  11/2010    graft inserted to left arm  . Nephrectomy Left     Malignant tumor  . Cataract extraction Bilateral   . Tonsillectomy    . Cardiac catheterization    . Left heart catheterization with coronary angiogram N/A 02/22/2011     Procedure: LEFT HEART CATHETERIZATION WITH CORONARY ANGIOGRAM;  Surgeon: Wellington Hampshire, MD;  Location: [facility] CATH LAB;  Service: Cardiovascular;  Laterality: N/A;  . Right heart catheterization N/A 01/29/2014  Procedure: RIGHT HEART CATH;  Surgeon: Larey Dresser, MD;  Location: [facility] CATH LAB;  Service: Cardiovascular;  Laterality: N/A;    ROS  As stated in the HPI and negative for all other systems.  PHYSICAL EXAM BP 100/58 mmHg  Pulse 68  Ht 5\' 2"  (1.575 m)  Wt 163 lb 6.4 oz (74.118 kg)  BMI 29.88 kg/m2 GENERAL:  Well appearing HEENT:  Pupils equal round and reactive, fundi not visualized, oral mucosa unremarkable NECK:  Jugular venous distention, waveform within normal limits, carotid upstroke brisk and symmetric, transmitted systolic bruit, no thyromegaly LYMPHATICS:  No cervical, inguinal adenopathy LUNGS:  Clear to auscultation bilaterally BACK:  No CVA tenderness CHEST:  Unremarkable HEART:  PMI not displaced or sustained,S1 and S2 within normal limits, no S3, no S4, no clicks, no rubs, apical systolic murmur early peaking and radiating out the outflow tract. ABD:  Flat, positive bowel sounds normal in frequency in pitch, no bruits, no rebound, no guarding, no midline pulsatile mass, no hepatomegaly, no splenomegaly EXT:  2 plus pulses throughout, right arm edema, clotted right arm fistula, thrill bruit left upper arm fistula,  no cyanosis no clubbing   ASSESSMENT AND PLAN  CHF - The patient has no new sypmtoms.  No further cardiovascular testing is indicated.  We will continue with aggressive risk reduction and meds as listed.  PULMONARY HTN - She did have a right heart cath and was going to be treated  with Latairis.   There is some confusion about this and I sent a note to pulmonary to clarify.   CAD -  The patient had nonobstructive disease previously. She has no new symptoms. No further cardiovascular testing is suggested.  TOBACCO USER -  She's tried many times to  quit smoking and has failed all available therapies.  She doesn't want to quit  ESSENTIAL HYPERTENSION, MALIGNANT -  The blood pressure is at target. No change in medications is indicated. We will continue with therapeutic lifestyle changes (TLC).

## 2014-04-09 NOTE — Telephone Encounter (Signed)
Pt is aware of results. Nothing further was needed. 

## 2014-04-09 NOTE — Telephone Encounter (Signed)
Thomasena Edis calling on this pt can be reached [phone] [id number] fax # [phone].Hillery Hunter

## 2014-04-09 NOTE — Patient Instructions (Signed)
Dr Percival Spanish recommends that you schedule a follow-up appointment in 1 year. You will receive a reminder letter in the mail two months in advance. If you don't receive a letter, please call our office to schedule the follow-up appointment.  Dr Percival Spanish has made no changes in your current medications or treatment plan.

## 2014-04-10 DIAGNOSIS — E119 Type 2 diabetes mellitus without complications: Secondary | ICD-10-CM | POA: Diagnosis not present

## 2014-04-10 DIAGNOSIS — N186 End stage renal disease: Secondary | ICD-10-CM | POA: Diagnosis not present

## 2014-04-10 DIAGNOSIS — D509 Iron deficiency anemia, unspecified: Secondary | ICD-10-CM | POA: Diagnosis not present

## 2014-04-10 DIAGNOSIS — N2581 Secondary hyperparathyroidism of renal origin: Secondary | ICD-10-CM | POA: Diagnosis not present

## 2014-04-10 DIAGNOSIS — D631 Anemia in chronic kidney disease: Secondary | ICD-10-CM | POA: Diagnosis not present

## 2014-04-10 NOTE — Telephone Encounter (Signed)
Spoke with [name], states that he was just reaching out to follow up on form they need.  Made aware that Caryl Pina has form and this should be faxed back either today or Monday 04/13/14. Please advise Caryl Pina. Thanks.

## 2014-04-13 DIAGNOSIS — N186 End stage renal disease: Secondary | ICD-10-CM | POA: Diagnosis not present

## 2014-04-13 DIAGNOSIS — D631 Anemia in chronic kidney disease: Secondary | ICD-10-CM | POA: Diagnosis not present

## 2014-04-13 DIAGNOSIS — E119 Type 2 diabetes mellitus without complications: Secondary | ICD-10-CM | POA: Diagnosis not present

## 2014-04-13 DIAGNOSIS — D509 Iron deficiency anemia, unspecified: Secondary | ICD-10-CM | POA: Diagnosis not present

## 2014-04-13 DIAGNOSIS — N2581 Secondary hyperparathyroidism of renal origin: Secondary | ICD-10-CM | POA: Diagnosis not present

## 2014-04-13 NOTE — Telephone Encounter (Signed)
Caryl Pina calling back about form  [phone] ext [phone]

## 2014-04-13 NOTE — Telephone Encounter (Signed)
Form re-signed by BQ and faxed back to [facility]

## 2014-04-13 NOTE — Telephone Encounter (Signed)
[name] please advise on the status thanks

## 2014-04-14 ENCOUNTER — Encounter (HOSPITAL_COMMUNITY): Admission: RE | Admit: 2014-04-14 | Payer: Medicare Other | Source: Ambulatory Visit

## 2014-04-14 ENCOUNTER — Ambulatory Visit (HOSPITAL_COMMUNITY): Payer: Medicare Other

## 2014-04-14 DIAGNOSIS — I871 Compression of vein: Secondary | ICD-10-CM | POA: Diagnosis not present

## 2014-04-14 DIAGNOSIS — N186 End stage renal disease: Secondary | ICD-10-CM | POA: Diagnosis not present

## 2014-04-14 DIAGNOSIS — Z992 Dependence on renal dialysis: Secondary | ICD-10-CM | POA: Diagnosis not present

## 2014-04-14 DIAGNOSIS — T82858D Stenosis of vascular prosthetic devices, implants and grafts, subsequent encounter: Secondary | ICD-10-CM | POA: Diagnosis not present

## 2014-04-15 ENCOUNTER — Telehealth: Payer: Self-pay | Admitting: Pulmonary Disease

## 2014-04-15 DIAGNOSIS — D631 Anemia in chronic kidney disease: Secondary | ICD-10-CM | POA: Diagnosis not present

## 2014-04-15 DIAGNOSIS — D509 Iron deficiency anemia, unspecified: Secondary | ICD-10-CM | POA: Diagnosis not present

## 2014-04-15 DIAGNOSIS — E119 Type 2 diabetes mellitus without complications: Secondary | ICD-10-CM | POA: Diagnosis not present

## 2014-04-15 DIAGNOSIS — N186 End stage renal disease: Secondary | ICD-10-CM | POA: Diagnosis not present

## 2014-04-15 DIAGNOSIS — N2581 Secondary hyperparathyroidism of renal origin: Secondary | ICD-10-CM | POA: Diagnosis not present

## 2014-04-15 NOTE — Telephone Encounter (Signed)
Spoke with Caryl Pina at [facility], states that she received the new application but now pt will be needing a PA.  This will be through [employer].  This will be faxed to our office today.    Will hold in triage to look out for.

## 2014-04-16 NOTE — Telephone Encounter (Signed)
Enrollment forms have been faxed and completed.

## 2014-04-16 NOTE — Telephone Encounter (Signed)
Anything further needed? Please advise Caryl Pina. Thanks.

## 2014-04-17 DIAGNOSIS — N2581 Secondary hyperparathyroidism of renal origin: Secondary | ICD-10-CM | POA: Diagnosis not present

## 2014-04-17 DIAGNOSIS — E119 Type 2 diabetes mellitus without complications: Secondary | ICD-10-CM | POA: Diagnosis not present

## 2014-04-17 DIAGNOSIS — N186 End stage renal disease: Secondary | ICD-10-CM | POA: Diagnosis not present

## 2014-04-17 DIAGNOSIS — D509 Iron deficiency anemia, unspecified: Secondary | ICD-10-CM | POA: Diagnosis not present

## 2014-04-17 DIAGNOSIS — D631 Anemia in chronic kidney disease: Secondary | ICD-10-CM | POA: Diagnosis not present

## 2014-04-20 DIAGNOSIS — N2581 Secondary hyperparathyroidism of renal origin: Secondary | ICD-10-CM | POA: Diagnosis not present

## 2014-04-20 DIAGNOSIS — N186 End stage renal disease: Secondary | ICD-10-CM | POA: Diagnosis not present

## 2014-04-20 DIAGNOSIS — D631 Anemia in chronic kidney disease: Secondary | ICD-10-CM | POA: Diagnosis not present

## 2014-04-20 DIAGNOSIS — E119 Type 2 diabetes mellitus without complications: Secondary | ICD-10-CM | POA: Diagnosis not present

## 2014-04-20 DIAGNOSIS — D509 Iron deficiency anemia, unspecified: Secondary | ICD-10-CM | POA: Diagnosis not present

## 2014-04-20 NOTE — Telephone Encounter (Signed)
[name] with [facility] called about PA. She states she is our physician account liason & if we have any issues getting PA she is our contact. Her # [phone]

## 2014-04-20 NOTE — Telephone Encounter (Signed)
This PA has been received in Triage. Will route to [place] to follow up on. PA form will be placed in BQ look at. There are some questions on the form that I am unaware of the answers.

## 2014-04-21 NOTE — Telephone Encounter (Signed)
PA has been filled out and faxed back to [place].

## 2014-04-21 NOTE — Telephone Encounter (Signed)
Will send to [facility] as Juluis Rainier

## 2014-04-22 DIAGNOSIS — N2581 Secondary hyperparathyroidism of renal origin: Secondary | ICD-10-CM | POA: Diagnosis not present

## 2014-04-22 DIAGNOSIS — D631 Anemia in chronic kidney disease: Secondary | ICD-10-CM | POA: Diagnosis not present

## 2014-04-22 DIAGNOSIS — E119 Type 2 diabetes mellitus without complications: Secondary | ICD-10-CM | POA: Diagnosis not present

## 2014-04-22 DIAGNOSIS — N186 End stage renal disease: Secondary | ICD-10-CM | POA: Diagnosis not present

## 2014-04-22 DIAGNOSIS — D509 Iron deficiency anemia, unspecified: Secondary | ICD-10-CM | POA: Diagnosis not present

## 2014-04-23 NOTE — Telephone Encounter (Signed)
Please try to fax it through [employer] fax # [phone] That goes directly [facility]ck[facility]

## 2014-04-23 NOTE — Telephone Encounter (Signed)
This was faxed to [facility] direct fax with a confirmation.

## 2014-04-23 NOTE — Telephone Encounter (Signed)
lmtcb for [name]- the fax # provided on PA has not been going through.  I've faxed this PA several times from 3 fax machines at 2 offices and the fax keeps failing.   Requested an alternative fax # from [facility] to get this sent to [place].

## 2014-04-24 DIAGNOSIS — D631 Anemia in chronic kidney disease: Secondary | ICD-10-CM | POA: Diagnosis not present

## 2014-04-24 DIAGNOSIS — E119 Type 2 diabetes mellitus without complications: Secondary | ICD-10-CM | POA: Diagnosis not present

## 2014-04-24 DIAGNOSIS — N2581 Secondary hyperparathyroidism of renal origin: Secondary | ICD-10-CM | POA: Diagnosis not present

## 2014-04-24 DIAGNOSIS — N186 End stage renal disease: Secondary | ICD-10-CM | POA: Diagnosis not present

## 2014-04-24 DIAGNOSIS — D509 Iron deficiency anemia, unspecified: Secondary | ICD-10-CM | POA: Diagnosis not present

## 2014-04-26 NOTE — Op Note (Signed)
PATIENT NAME:  [name], [name] MR#:  [id number] DATE OF BIRTH:  1945/12/09  DATE OF PROCEDURE:  01/04/2011  PREOPERATIVE DIAGNOSES: 1. End-stage renal disease.  2. Functional left arm AV graft and right arm AV graft with massive right arm swelling and not using this graft.  3. Hypertension.   POSTOPERATIVE DIAGNOSES: 1. End-stage renal disease.  2. Functional left arm AV graft and right arm AV graft with massive right arm swelling and not using this graft.  3. Hypertension.   PROCEDURE: Ligation of right arm AV graft.   SURGEON: Algernon Huxley, MD  ANESTHESIA: MAC.   ESTIMATED BLOOD LOSS: Minimal.   INDICATION FOR PROCEDURE: 69 year old African American female with end-stage renal disease. She had a left arm AV graft and subsequent drill procedure for ischemic hand. This resulted in a functional access in the left arm. Her right arm access is no longer functional due to the massive arm swelling and her graft is going to be ligated to reduce this arm swelling. Risks and benefits were discussed, informed consent was obtained.   DESCRIPTION OF PROCEDURE: Patient was brought to the operative suite and after an adequate level of intravenous sedation was obtained, the right upper extremity was sterilely prepped and draped and a sterile surgical field was created. Incision was created overlying the graft. We dissected down and dissected out the graft over several centimeters. Four 0 silk ties were used to ligate the graft in this location. This resulted in no pulsatile flow beyond this portion of the graft that could be palpated and at this point I elected to terminate the procedure. I closed the wound with running 3-0 Vicryl and 4-0 Monocryl. Dermabond was placed as a dressing. The patient tolerated the procedure well and was taken to the recovery room in stable condition.   ____________________________ Algernon Huxley, MD jsd:cms D: 01/04/2011 13:10:41 ET T: 01/04/2011 14:20:25  ET JOB#: [id number]  cc: Algernon Huxley, MD, <Dictator> Algernon Huxley MD ELECTRONICALLY SIGNED 01/20/2011 7:57

## 2014-04-26 NOTE — Op Note (Signed)
PATIENT NAME:  [name], GAUGHRAN MR#:  [id number] DATE OF BIRTH:  25-Jun-1945  DATE OF PROCEDURE:  01/16/2011  PREOPERATIVE DIAGNOSES:  1. End-stage renal disease.  2. Poorly functioning left arm AV graft with previous duplex suggesting venous anastomotic stenosis.  3. Status post drill procedure left arm for ischemia.  4. Status post ligation of right arm AV graft for arm swelling.   POSTOPERATIVE DIAGNOSES:  1. End-stage renal disease.  2. Poorly functioning left arm AV graft with previous duplex suggesting venous anastomotic stenosis.  3. Status post drill procedure left arm for ischemia.  4. Status post ligation of right arm AV graft for arm swelling.   PROCEDURES:  1. Ultrasound guidance for vascular access, left brachial artery axillary vein AV graft.  2. Left upper extremity shuntogram and central venogram.  3. Percutaneous transluminal angioplasty of venous anastomosis with a 7 and 8 mm diameter angioplasty balloon.   SURGEON: Algernon Huxley, M.D.   ANESTHESIA: Local with moderate conscious sedation.   ESTIMATED BLOOD LOSS: Minimal.   FLUOROSCOPY TIME: Approximately 2 minutes.   CONTRAST USED: 22 mL.   INDICATION FOR PROCEDURE: 69 year old [place] American female well known to me for dialysis access. She was actually on the schedule for later this week for an outpatient duplex when the dialysis center called today and said her graft was functioning extremely poorly and wanted her assessed as soon as possible for salvage of the graft. Risks and benefits were discussed. Informed consent was obtained.   DESCRIPTION OF PROCEDURE: Patient brought to the vascular interventional radiology suite. Left upper extremity was sterilely prepped and draped and a sterile surgical field was created. The graft was accessed a few centimeters beyond the arterial anastomosis with ultrasound guidance without difficulty with a Seldinger needle. Program was recorded. Micropuncture wire, sheath, and then  upsized to a 6 [place] sheath. Imaging through this sheath showed an 80% to 90% stenosis at the venous anastomosis. Remainder of the central venous circulation was patent from the left side. I crossed this lesion with a Magic torque wire and treated with a 7 and 8 mm diameter angioplasty balloon. With the balloon inflated I injected contrast, refluxed the arterial anastomosis which was widely patent. This refluxed back into her drill bypass which was patent both throughout the graft as well as the proximal and distal anastomoses. Following angioplasty there was mild residual stenosis of less than 20% and I elected to terminate the procedure. 4-0 Monocryl pursestring suture was placed. The sheath was removed. Pressure was held. Sterile dressing was placed. The patient tolerated procedure well and was taken to recovery room in stable condition.   ____________________________ Algernon Huxley, MD jsd:cms D: 01/16/2011 16:20:44 ET T: 01/17/2011 10:20:21 ET JOB#: [id number]  cc: Algernon Huxley, MD, <Dictator> Algernon Huxley MD ELECTRONICALLY SIGNED 02/04/2011 15:46

## 2014-04-27 DIAGNOSIS — N2581 Secondary hyperparathyroidism of renal origin: Secondary | ICD-10-CM | POA: Diagnosis not present

## 2014-04-27 DIAGNOSIS — D631 Anemia in chronic kidney disease: Secondary | ICD-10-CM | POA: Diagnosis not present

## 2014-04-27 DIAGNOSIS — N186 End stage renal disease: Secondary | ICD-10-CM | POA: Diagnosis not present

## 2014-04-27 DIAGNOSIS — D509 Iron deficiency anemia, unspecified: Secondary | ICD-10-CM | POA: Diagnosis not present

## 2014-04-27 DIAGNOSIS — E119 Type 2 diabetes mellitus without complications: Secondary | ICD-10-CM | POA: Diagnosis not present

## 2014-04-27 NOTE — Telephone Encounter (Signed)
[name] please advise if you have received a determination yet. Thanks.

## 2014-04-27 NOTE — Telephone Encounter (Signed)
[name] has been approved for pt, 01/24/14-94/22/19, #30 for 30 day supply.    Reached out to [place] [facility] and [place] with [employer] to make them aware.  Caryl Pina has already spoken with pt to make her aware.  Nothing further needed.

## 2014-04-27 NOTE — Telephone Encounter (Signed)
I have not received any further documentation on this PA.

## 2014-04-29 DIAGNOSIS — D631 Anemia in chronic kidney disease: Secondary | ICD-10-CM | POA: Diagnosis not present

## 2014-04-29 DIAGNOSIS — N186 End stage renal disease: Secondary | ICD-10-CM | POA: Diagnosis not present

## 2014-04-29 DIAGNOSIS — D509 Iron deficiency anemia, unspecified: Secondary | ICD-10-CM | POA: Diagnosis not present

## 2014-04-29 DIAGNOSIS — N2581 Secondary hyperparathyroidism of renal origin: Secondary | ICD-10-CM | POA: Diagnosis not present

## 2014-04-29 DIAGNOSIS — E119 Type 2 diabetes mellitus without complications: Secondary | ICD-10-CM | POA: Diagnosis not present

## 2014-05-01 DIAGNOSIS — D631 Anemia in chronic kidney disease: Secondary | ICD-10-CM | POA: Diagnosis not present

## 2014-05-01 DIAGNOSIS — N2581 Secondary hyperparathyroidism of renal origin: Secondary | ICD-10-CM | POA: Diagnosis not present

## 2014-05-01 DIAGNOSIS — I120 Hypertensive chronic kidney disease with stage 5 chronic kidney disease or end stage renal disease: Secondary | ICD-10-CM | POA: Diagnosis not present

## 2014-05-01 DIAGNOSIS — E119 Type 2 diabetes mellitus without complications: Secondary | ICD-10-CM | POA: Diagnosis not present

## 2014-05-01 DIAGNOSIS — J705 Respiratory conditions due to smoke inhalation: Secondary | ICD-10-CM | POA: Diagnosis not present

## 2014-05-01 DIAGNOSIS — N186 End stage renal disease: Secondary | ICD-10-CM | POA: Diagnosis not present

## 2014-05-01 DIAGNOSIS — R062 Wheezing: Secondary | ICD-10-CM | POA: Diagnosis not present

## 2014-05-01 DIAGNOSIS — D509 Iron deficiency anemia, unspecified: Secondary | ICD-10-CM | POA: Diagnosis not present

## 2014-05-02 DIAGNOSIS — E1129 Type 2 diabetes mellitus with other diabetic kidney complication: Secondary | ICD-10-CM | POA: Diagnosis not present

## 2014-05-02 DIAGNOSIS — N186 End stage renal disease: Secondary | ICD-10-CM | POA: Diagnosis not present

## 2014-05-02 DIAGNOSIS — Z992 Dependence on renal dialysis: Secondary | ICD-10-CM | POA: Diagnosis not present

## 2014-05-04 DIAGNOSIS — D631 Anemia in chronic kidney disease: Secondary | ICD-10-CM | POA: Diagnosis not present

## 2014-05-04 DIAGNOSIS — E119 Type 2 diabetes mellitus without complications: Secondary | ICD-10-CM | POA: Diagnosis not present

## 2014-05-04 DIAGNOSIS — N2581 Secondary hyperparathyroidism of renal origin: Secondary | ICD-10-CM | POA: Diagnosis not present

## 2014-05-04 DIAGNOSIS — N186 End stage renal disease: Secondary | ICD-10-CM | POA: Diagnosis not present

## 2014-05-04 DIAGNOSIS — D509 Iron deficiency anemia, unspecified: Secondary | ICD-10-CM | POA: Diagnosis not present

## 2014-05-06 DIAGNOSIS — D509 Iron deficiency anemia, unspecified: Secondary | ICD-10-CM | POA: Diagnosis not present

## 2014-05-06 DIAGNOSIS — I272 Other secondary pulmonary hypertension: Secondary | ICD-10-CM | POA: Diagnosis not present

## 2014-05-06 DIAGNOSIS — Z79899 Other long term (current) drug therapy: Secondary | ICD-10-CM | POA: Diagnosis not present

## 2014-05-06 DIAGNOSIS — N186 End stage renal disease: Secondary | ICD-10-CM | POA: Diagnosis not present

## 2014-05-06 DIAGNOSIS — N2581 Secondary hyperparathyroidism of renal origin: Secondary | ICD-10-CM | POA: Diagnosis not present

## 2014-05-06 DIAGNOSIS — E119 Type 2 diabetes mellitus without complications: Secondary | ICD-10-CM | POA: Diagnosis not present

## 2014-05-06 DIAGNOSIS — L600 Ingrowing nail: Secondary | ICD-10-CM | POA: Diagnosis not present

## 2014-05-06 DIAGNOSIS — D631 Anemia in chronic kidney disease: Secondary | ICD-10-CM | POA: Diagnosis not present

## 2014-05-06 DIAGNOSIS — J705 Respiratory conditions due to smoke inhalation: Secondary | ICD-10-CM | POA: Diagnosis not present

## 2014-05-08 DIAGNOSIS — D631 Anemia in chronic kidney disease: Secondary | ICD-10-CM | POA: Diagnosis not present

## 2014-05-08 DIAGNOSIS — N186 End stage renal disease: Secondary | ICD-10-CM | POA: Diagnosis not present

## 2014-05-08 DIAGNOSIS — E119 Type 2 diabetes mellitus without complications: Secondary | ICD-10-CM | POA: Diagnosis not present

## 2014-05-08 DIAGNOSIS — D509 Iron deficiency anemia, unspecified: Secondary | ICD-10-CM | POA: Diagnosis not present

## 2014-05-08 DIAGNOSIS — N2581 Secondary hyperparathyroidism of renal origin: Secondary | ICD-10-CM | POA: Diagnosis not present

## 2014-05-11 DIAGNOSIS — E119 Type 2 diabetes mellitus without complications: Secondary | ICD-10-CM | POA: Diagnosis not present

## 2014-05-11 DIAGNOSIS — D631 Anemia in chronic kidney disease: Secondary | ICD-10-CM | POA: Diagnosis not present

## 2014-05-11 DIAGNOSIS — N186 End stage renal disease: Secondary | ICD-10-CM | POA: Diagnosis not present

## 2014-05-11 DIAGNOSIS — N2581 Secondary hyperparathyroidism of renal origin: Secondary | ICD-10-CM | POA: Diagnosis not present

## 2014-05-11 DIAGNOSIS — D509 Iron deficiency anemia, unspecified: Secondary | ICD-10-CM | POA: Diagnosis not present

## 2014-05-12 DIAGNOSIS — H2513 Age-related nuclear cataract, bilateral: Secondary | ICD-10-CM | POA: Diagnosis not present

## 2014-05-12 DIAGNOSIS — H35033 Hypertensive retinopathy, bilateral: Secondary | ICD-10-CM | POA: Diagnosis not present

## 2014-05-12 DIAGNOSIS — H40013 Open angle with borderline findings, low risk, bilateral: Secondary | ICD-10-CM | POA: Diagnosis not present

## 2014-05-12 DIAGNOSIS — H524 Presbyopia: Secondary | ICD-10-CM | POA: Diagnosis not present

## 2014-05-13 DIAGNOSIS — D631 Anemia in chronic kidney disease: Secondary | ICD-10-CM | POA: Diagnosis not present

## 2014-05-13 DIAGNOSIS — D509 Iron deficiency anemia, unspecified: Secondary | ICD-10-CM | POA: Diagnosis not present

## 2014-05-13 DIAGNOSIS — E119 Type 2 diabetes mellitus without complications: Secondary | ICD-10-CM | POA: Diagnosis not present

## 2014-05-13 DIAGNOSIS — N2581 Secondary hyperparathyroidism of renal origin: Secondary | ICD-10-CM | POA: Diagnosis not present

## 2014-05-13 DIAGNOSIS — N186 End stage renal disease: Secondary | ICD-10-CM | POA: Diagnosis not present

## 2014-05-15 DIAGNOSIS — N186 End stage renal disease: Secondary | ICD-10-CM | POA: Diagnosis not present

## 2014-05-15 DIAGNOSIS — D509 Iron deficiency anemia, unspecified: Secondary | ICD-10-CM | POA: Diagnosis not present

## 2014-05-15 DIAGNOSIS — D631 Anemia in chronic kidney disease: Secondary | ICD-10-CM | POA: Diagnosis not present

## 2014-05-15 DIAGNOSIS — N2581 Secondary hyperparathyroidism of renal origin: Secondary | ICD-10-CM | POA: Diagnosis not present

## 2014-05-15 DIAGNOSIS — E119 Type 2 diabetes mellitus without complications: Secondary | ICD-10-CM | POA: Diagnosis not present

## 2014-05-18 DIAGNOSIS — N186 End stage renal disease: Secondary | ICD-10-CM | POA: Diagnosis not present

## 2014-05-18 DIAGNOSIS — D509 Iron deficiency anemia, unspecified: Secondary | ICD-10-CM | POA: Diagnosis not present

## 2014-05-18 DIAGNOSIS — E119 Type 2 diabetes mellitus without complications: Secondary | ICD-10-CM | POA: Diagnosis not present

## 2014-05-18 DIAGNOSIS — D631 Anemia in chronic kidney disease: Secondary | ICD-10-CM | POA: Diagnosis not present

## 2014-05-18 DIAGNOSIS — N2581 Secondary hyperparathyroidism of renal origin: Secondary | ICD-10-CM | POA: Diagnosis not present

## 2014-05-20 DIAGNOSIS — N186 End stage renal disease: Secondary | ICD-10-CM | POA: Diagnosis not present

## 2014-05-20 DIAGNOSIS — D631 Anemia in chronic kidney disease: Secondary | ICD-10-CM | POA: Diagnosis not present

## 2014-05-20 DIAGNOSIS — D509 Iron deficiency anemia, unspecified: Secondary | ICD-10-CM | POA: Diagnosis not present

## 2014-05-20 DIAGNOSIS — E119 Type 2 diabetes mellitus without complications: Secondary | ICD-10-CM | POA: Diagnosis not present

## 2014-05-20 DIAGNOSIS — N2581 Secondary hyperparathyroidism of renal origin: Secondary | ICD-10-CM | POA: Diagnosis not present

## 2014-05-22 DIAGNOSIS — N186 End stage renal disease: Secondary | ICD-10-CM | POA: Diagnosis not present

## 2014-05-22 DIAGNOSIS — E119 Type 2 diabetes mellitus without complications: Secondary | ICD-10-CM | POA: Diagnosis not present

## 2014-05-22 DIAGNOSIS — N2581 Secondary hyperparathyroidism of renal origin: Secondary | ICD-10-CM | POA: Diagnosis not present

## 2014-05-22 DIAGNOSIS — D631 Anemia in chronic kidney disease: Secondary | ICD-10-CM | POA: Diagnosis not present

## 2014-05-22 DIAGNOSIS — D509 Iron deficiency anemia, unspecified: Secondary | ICD-10-CM | POA: Diagnosis not present

## 2014-05-25 DIAGNOSIS — D509 Iron deficiency anemia, unspecified: Secondary | ICD-10-CM | POA: Diagnosis not present

## 2014-05-25 DIAGNOSIS — E119 Type 2 diabetes mellitus without complications: Secondary | ICD-10-CM | POA: Diagnosis not present

## 2014-05-25 DIAGNOSIS — N2581 Secondary hyperparathyroidism of renal origin: Secondary | ICD-10-CM | POA: Diagnosis not present

## 2014-05-25 DIAGNOSIS — D631 Anemia in chronic kidney disease: Secondary | ICD-10-CM | POA: Diagnosis not present

## 2014-05-25 DIAGNOSIS — N186 End stage renal disease: Secondary | ICD-10-CM | POA: Diagnosis not present

## 2014-05-26 ENCOUNTER — Encounter: Payer: Self-pay | Admitting: Pulmonary Disease

## 2014-05-26 ENCOUNTER — Ambulatory Visit (INDEPENDENT_AMBULATORY_CARE_PROVIDER_SITE_OTHER): Payer: Medicare Other | Admitting: Pulmonary Disease

## 2014-05-26 ENCOUNTER — Ambulatory Visit: Payer: Medicare Other | Admitting: Pulmonary Disease

## 2014-05-26 ENCOUNTER — Other Ambulatory Visit (INDEPENDENT_AMBULATORY_CARE_PROVIDER_SITE_OTHER): Payer: Medicare Other

## 2014-05-26 VITALS — BP 124/68 | HR 67 | Ht 62.5 in | Wt 166.0 lb

## 2014-05-26 DIAGNOSIS — Z5181 Encounter for therapeutic drug level monitoring: Secondary | ICD-10-CM

## 2014-05-26 DIAGNOSIS — I270 Primary pulmonary hypertension: Secondary | ICD-10-CM

## 2014-05-26 DIAGNOSIS — I2510 Atherosclerotic heart disease of native coronary artery without angina pectoris: Secondary | ICD-10-CM | POA: Diagnosis not present

## 2014-05-26 DIAGNOSIS — I272 Pulmonary hypertension, unspecified: Secondary | ICD-10-CM

## 2014-05-26 LAB — HEPATIC FUNCTION PANEL
ALT: 14 U/L (ref 0–35)
AST: 17 U/L (ref 0–37)
Albumin: 3.8 g/dL (ref 3.5–5.2)
Alkaline Phosphatase: 171 U/L — ABNORMAL HIGH (ref 39–117)
Bilirubin, Direct: 0 mg/dL (ref 0.0–0.3)
Total Bilirubin: 0.4 mg/dL (ref 0.2–1.2)
Total Protein: 7 g/dL (ref 6.0–8.3)

## 2014-05-26 MED ORDER — AMBRISENTAN 5 MG PO TABS: 5.0000 mg | ORAL_TABLET | Freq: Every day | ORAL | Status: DC

## 2014-05-26 NOTE — Patient Instructions (Signed)
We will call you with the results of today's blood work Continue with your plans to get a liver function test in 3 months We will see you back in 6 months and we'll do another 6 minute walk on that visit.

## 2014-05-26 NOTE — Progress Notes (Signed)
Subjective:    Patient ID: [name], female    DOB: 06-18-1945, 69 y.o.   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  PFTs normal.  Active smoker. Secondary Pulmonary hypertension, ESRD, CHF 04/08/2013 full pulmonary function test> ratio 77%, FEV1 1.44 L (80% predicted), total lung capacity 3.55 (72% predicted), ERV 0.23 L (129% predicted), DLCO 10.95 (47% predicted) 05/2013 Echo> LVEF 35-40% (diffuse global hypokinesis), RV severely dilated, RVSP 17mHg, RA dilated 05/2013 6MW> 240 meters, O2 saturaiton 99% exercise 05/2013 CT chest > no ILD, definite heart failure and CAD 1/26 RHC > RA mean 1 RV 52/1 PA 52/20, mean 30 PCWP mean 6  Oxygen saturations: PA 70% AO 95%  Cardiac Output (Fick) 5.77  Cardiac Index (Fick) 3.32 PVR 4.2 WU  Cardiac Output (Thermo) 5.12 Cardiac Index (Thermo) 2.94 PVR 4.7 WU 03/2014 ANA neg, HIV neg, SCL-70 neg, RNP neg, B2 glycoprotein neg, DS DNA neg 05/2014 6MW> 349m (98% RA)  HPI  Chief Complaint  Patient presents with  . Follow-up    Review 3mw from today.  pt c/o weight gain since starting the Letairis.     [name] says that she has gained weight since the last visit.  She says taht she has gained several pounds. Last month she had a flare of bronchitis after exposure to some smoke.  She took prednisone for a month.  She took an injection of some sort.  She says that her breathing is better now. She says her breathing is fine.  Her appetitis is OK. NO N/V/D.   Past Medical History  Diagnosis Date  . CAD (coronary artery disease)     stent to RCA  . CVA (cerebral infarction) 2003    no apparent residual  . Hypothyroidism   . PVD (peripheral vascular disease)   . Hyperlipidemia   . Positive PPD     completed rifampin  . Diastolic congestive heart failure   . Hypertension   . COPD (chronic obstructive pulmonary disease) t  . Cancer     clear cell cancer, kidney  . Complication of anesthesia 12/2010    pt is very confused, with AMS with anesthesia  . CVA 07/27/2008    CVA affected cognition and memory per family, no focal deficits.    . ESRD 07/27/2008    ESRD due to HTN and NSAID's, started hemodialysis in 2005 in [place], [place]. Went to [facility] from 2010 to 2012 and since 2012 has been getting dialysis at [facility] on [address] in [place] on a MWF schedule. First access with RUA AVG placed in [place]. Next and current access was LUA AVG placed by Dr. Lucky Cowboy in [place] in or around 2012. Has had 2 or 3 procedures on that graft since placed per family. She gets her access work done here in [place] now. She is allergic to heparin and does not get any heparin at dialysis; she had an allergic reaction apparently when in ICU in the past.      . Encephalopathy   . Seizures   . Dyslipidemia   . Chronic renal insufficiency     On hemodialysis  . Arthritis of shoulder     Bilateral  . Depression      Review of Systems  Constitutional: Negative for fever, chills and fatigue.  HENT: Negative for rhinorrhea, sinus pressure and sneezing.   Respiratory: Negative for cough, shortness of breath and wheezing.   Cardiovascular: Negative for chest  pain, palpitations and leg swelling.       Objective:   Physical Exam  Filed Vitals:   05/26/14 1435  BP: 124/68  Pulse: 67  Height: 5' 2.5" (1.588 m)  Weight: 166 lb (75.297 kg)  SpO2: 97%   RA  Gen: well appearing HEENT: NCAT, OP clear PULM: CTA B CV: RRR, systolic murmr I/VI RUSB, JVD noted AB: BS+, soft Ext: warm, no edema, clubbing noted Neuro: A&Ox4, maew   05/2013 6MW> 240 meters, O2 saturaiton 99% exercise 01/29/2014 RHC> RA mean 1 RV 52/1 PA 52/20, mean 30 PCWP mean 6  Oxygen saturations: PA 70% AO 95%  Cardiac Output (Fick) 5.77  Cardiac Index (Fick) 3.32 PVR 4.2 WU  Cardiac Output (Thermo) 5.12 Cardiac Index (Thermo) 2.94 PVR 4.7 WU     Assessment & Plan:   No problem-specific assessment &  plan notes found for this encounter.   Updated Medication List Outpatient Encounter Prescriptions as of 05/26/2014  Medication Sig  . acetaminophen (TYLENOL) 500 MG tablet Take 500 mg by mouth every 6 (six) hours as needed for pain.  [name] acyclovir (ZOVIRAX) 200 MG capsule Take 200 mg by mouth daily as needed (shingles).  [name] albuterol (PROVENTIL HFA;VENTOLIN HFA) 108 (90 BASE) MCG/ACT inhaler Inhale 1-2 puffs into the lungs every 6 (six) hours as needed for wheezing or shortness of breath.  [name] amLODipine (NORVASC) 10 MG tablet Take 10 mg by mouth at bedtime.   [name] aspirin EC 81 MG tablet Take 81 mg by mouth daily.  . carvedilol (COREG) 25 MG tablet Take 25 mg by mouth 2 (two) times daily with a meal.  . hydrALAZINE (APRESOLINE) 50 MG tablet Take 50 mg by mouth 3 (three) times daily.  . hydrOXYzine (ATARAX/VISTARIL) 25 MG tablet Take 1 tablet by mouth daily as needed. On dialysis days  . isosorbide mononitrate (IMDUR) 30 MG 24 hr tablet Take 30 mg by mouth daily.  [name] levETIRAcetam (KEPPRA) 500 MG tablet Take 500 mg by mouth daily at 8 pm.  . levothyroxine (SYNTHROID, LEVOTHROID) 88 MCG tablet Take 88 mcg by mouth daily before breakfast.  . loperamide (IMODIUM A-D) 2 MG tablet Take 2 mg by mouth 4 (four) times daily as needed for diarrhea or loose stools.  [name] loratadine (CLARITIN) 10 MG tablet Take 10 mg by mouth daily as needed for allergies.  . multivitamin (RENA-VIT) TABS tablet Take 1 tablet by mouth daily.  [name] NITROSTAT 0.4 MG SL tablet Place 0.4 mg under the tongue every 5 (five) minutes as needed for chest pain.   [name] omeprazole (PRILOSEC OTC) 20 MG tablet Take 20 mg by mouth daily.  . ondansetron (ZOFRAN ODT) 8 MG disintegrating tablet Take 1 tablet (8 mg total) by mouth every 8 (eight) hours as needed for nausea or vomiting.  . pravastatin (PRAVACHOL) 40 MG tablet Take 40 mg by mouth daily.  . SENSIPAR 90 MG tablet Take 1 tablet by mouth daily.  . sevelamer carbonate (RENVELA) 800 MG tablet Take  1,600-3,200 mg by mouth 3 (three) times daily. 4 tabs TID, 2 tabs with each snack  . traMADol (ULTRAM) 50 MG tablet Take 1 tablet by mouth daily as needed.  . traZODone (DESYREL) 50 MG tablet Take 50 mg by mouth at bedtime as needed for sleep.    No facility-administered encounter medications on file as of 05/26/2014.

## 2014-05-26 NOTE — Assessment & Plan Note (Signed)
This has been a stable interval for [name] and I am very encouraged by the fact that her 6 minute walk improved significantly with the addition of Letairs.  She has tolerated the medication well without major side effect.  Plan: Continue Letairis 5 mg daily Obtain liver function test today and in 3 months to monitor for toxicity Repeat 6 minute walk in 6 months with return visit

## 2014-05-26 NOTE — Progress Notes (Signed)
SMW performed today. 

## 2014-05-27 DIAGNOSIS — N2581 Secondary hyperparathyroidism of renal origin: Secondary | ICD-10-CM | POA: Diagnosis not present

## 2014-05-27 DIAGNOSIS — D631 Anemia in chronic kidney disease: Secondary | ICD-10-CM | POA: Diagnosis not present

## 2014-05-27 DIAGNOSIS — E119 Type 2 diabetes mellitus without complications: Secondary | ICD-10-CM | POA: Diagnosis not present

## 2014-05-27 DIAGNOSIS — N186 End stage renal disease: Secondary | ICD-10-CM | POA: Diagnosis not present

## 2014-05-27 DIAGNOSIS — D509 Iron deficiency anemia, unspecified: Secondary | ICD-10-CM | POA: Diagnosis not present

## 2014-05-29 DIAGNOSIS — N2581 Secondary hyperparathyroidism of renal origin: Secondary | ICD-10-CM | POA: Diagnosis not present

## 2014-05-29 DIAGNOSIS — N186 End stage renal disease: Secondary | ICD-10-CM | POA: Diagnosis not present

## 2014-05-29 DIAGNOSIS — D631 Anemia in chronic kidney disease: Secondary | ICD-10-CM | POA: Diagnosis not present

## 2014-05-29 DIAGNOSIS — D509 Iron deficiency anemia, unspecified: Secondary | ICD-10-CM | POA: Diagnosis not present

## 2014-05-29 DIAGNOSIS — E119 Type 2 diabetes mellitus without complications: Secondary | ICD-10-CM | POA: Diagnosis not present

## 2014-06-01 DIAGNOSIS — N2581 Secondary hyperparathyroidism of renal origin: Secondary | ICD-10-CM | POA: Diagnosis not present

## 2014-06-01 DIAGNOSIS — E119 Type 2 diabetes mellitus without complications: Secondary | ICD-10-CM | POA: Diagnosis not present

## 2014-06-01 DIAGNOSIS — D509 Iron deficiency anemia, unspecified: Secondary | ICD-10-CM | POA: Diagnosis not present

## 2014-06-01 DIAGNOSIS — N186 End stage renal disease: Secondary | ICD-10-CM | POA: Diagnosis not present

## 2014-06-01 DIAGNOSIS — D631 Anemia in chronic kidney disease: Secondary | ICD-10-CM | POA: Diagnosis not present

## 2014-06-02 DIAGNOSIS — Z992 Dependence on renal dialysis: Secondary | ICD-10-CM | POA: Diagnosis not present

## 2014-06-02 DIAGNOSIS — N186 End stage renal disease: Secondary | ICD-10-CM | POA: Diagnosis not present

## 2014-06-02 DIAGNOSIS — E1129 Type 2 diabetes mellitus with other diabetic kidney complication: Secondary | ICD-10-CM | POA: Diagnosis not present

## 2014-06-03 ENCOUNTER — Telehealth: Payer: Self-pay | Admitting: Pulmonary Disease

## 2014-06-03 DIAGNOSIS — D509 Iron deficiency anemia, unspecified: Secondary | ICD-10-CM | POA: Diagnosis not present

## 2014-06-03 DIAGNOSIS — N2581 Secondary hyperparathyroidism of renal origin: Secondary | ICD-10-CM | POA: Diagnosis not present

## 2014-06-03 DIAGNOSIS — E119 Type 2 diabetes mellitus without complications: Secondary | ICD-10-CM | POA: Diagnosis not present

## 2014-06-03 DIAGNOSIS — D631 Anemia in chronic kidney disease: Secondary | ICD-10-CM | POA: Diagnosis not present

## 2014-06-03 DIAGNOSIS — N186 End stage renal disease: Secondary | ICD-10-CM | POA: Diagnosis not present

## 2014-06-03 NOTE — Telephone Encounter (Signed)
Patient calling to inquire about Letaris.  Patient says that the Myrtis Hopping was never called in.  I checked the chart and it looks as if it was sent to [facility] [address] on [facility]/26[facility]14.  Patient was waiting for it to be [facility]. She did not know it was sent to local pharmacy.  Patient says she will go pick up the medication at the local pharmacy. Nothing further needed.

## 2014-06-04 ENCOUNTER — Telehealth: Payer: Self-pay | Admitting: Pulmonary Disease

## 2014-06-04 MED ORDER — AMBRISENTAN 5 MG PO TABS: 5.0000 mg | ORAL_TABLET | Freq: Every day | ORAL | Status: DC

## 2014-06-04 NOTE — Telephone Encounter (Signed)
Spoke with pt and she is requesting her letaris be sent to [facility]. I have done so. Nothing further needed

## 2014-06-05 DIAGNOSIS — D509 Iron deficiency anemia, unspecified: Secondary | ICD-10-CM | POA: Diagnosis not present

## 2014-06-05 DIAGNOSIS — E119 Type 2 diabetes mellitus without complications: Secondary | ICD-10-CM | POA: Diagnosis not present

## 2014-06-05 DIAGNOSIS — N2581 Secondary hyperparathyroidism of renal origin: Secondary | ICD-10-CM | POA: Diagnosis not present

## 2014-06-05 DIAGNOSIS — D631 Anemia in chronic kidney disease: Secondary | ICD-10-CM | POA: Diagnosis not present

## 2014-06-05 DIAGNOSIS — N186 End stage renal disease: Secondary | ICD-10-CM | POA: Diagnosis not present

## 2014-06-08 DIAGNOSIS — D631 Anemia in chronic kidney disease: Secondary | ICD-10-CM | POA: Diagnosis not present

## 2014-06-08 DIAGNOSIS — E119 Type 2 diabetes mellitus without complications: Secondary | ICD-10-CM | POA: Diagnosis not present

## 2014-06-08 DIAGNOSIS — N2581 Secondary hyperparathyroidism of renal origin: Secondary | ICD-10-CM | POA: Diagnosis not present

## 2014-06-08 DIAGNOSIS — N186 End stage renal disease: Secondary | ICD-10-CM | POA: Diagnosis not present

## 2014-06-08 DIAGNOSIS — D509 Iron deficiency anemia, unspecified: Secondary | ICD-10-CM | POA: Diagnosis not present

## 2014-06-09 ENCOUNTER — Telehealth: Payer: Self-pay | Admitting: Pulmonary Disease

## 2014-06-09 NOTE — Telephone Encounter (Signed)
Noted and nothing further needed

## 2014-06-09 NOTE — Telephone Encounter (Signed)
Patient says that she has not received her Letaris yet.  It was sent out on 06/04/14 to [facility]. Notified patient that he has been sent to [place] and gave her their phone number to call them and check on status of her shipment.  Patient says she will call them to check on shipment and will let us know if we need to do anything further on our end.  Nothing further needed.

## 2014-06-11 DIAGNOSIS — E119 Type 2 diabetes mellitus without complications: Secondary | ICD-10-CM | POA: Diagnosis not present

## 2014-06-11 DIAGNOSIS — N2581 Secondary hyperparathyroidism of renal origin: Secondary | ICD-10-CM | POA: Diagnosis not present

## 2014-06-11 DIAGNOSIS — D509 Iron deficiency anemia, unspecified: Secondary | ICD-10-CM | POA: Diagnosis not present

## 2014-06-11 DIAGNOSIS — D631 Anemia in chronic kidney disease: Secondary | ICD-10-CM | POA: Diagnosis not present

## 2014-06-11 DIAGNOSIS — N186 End stage renal disease: Secondary | ICD-10-CM | POA: Diagnosis not present

## 2014-06-12 DIAGNOSIS — E119 Type 2 diabetes mellitus without complications: Secondary | ICD-10-CM | POA: Diagnosis not present

## 2014-06-12 DIAGNOSIS — D509 Iron deficiency anemia, unspecified: Secondary | ICD-10-CM | POA: Diagnosis not present

## 2014-06-12 DIAGNOSIS — N186 End stage renal disease: Secondary | ICD-10-CM | POA: Diagnosis not present

## 2014-06-12 DIAGNOSIS — D631 Anemia in chronic kidney disease: Secondary | ICD-10-CM | POA: Diagnosis not present

## 2014-06-12 DIAGNOSIS — N2581 Secondary hyperparathyroidism of renal origin: Secondary | ICD-10-CM | POA: Diagnosis not present

## 2014-06-15 DIAGNOSIS — E119 Type 2 diabetes mellitus without complications: Secondary | ICD-10-CM | POA: Diagnosis not present

## 2014-06-15 DIAGNOSIS — N186 End stage renal disease: Secondary | ICD-10-CM | POA: Diagnosis not present

## 2014-06-15 DIAGNOSIS — N2581 Secondary hyperparathyroidism of renal origin: Secondary | ICD-10-CM | POA: Diagnosis not present

## 2014-06-15 DIAGNOSIS — D631 Anemia in chronic kidney disease: Secondary | ICD-10-CM | POA: Diagnosis not present

## 2014-06-15 DIAGNOSIS — D509 Iron deficiency anemia, unspecified: Secondary | ICD-10-CM | POA: Diagnosis not present

## 2014-06-17 ENCOUNTER — Other Ambulatory Visit: Payer: Self-pay | Admitting: Physician Assistant

## 2014-06-17 DIAGNOSIS — N186 End stage renal disease: Secondary | ICD-10-CM | POA: Diagnosis not present

## 2014-06-17 DIAGNOSIS — D509 Iron deficiency anemia, unspecified: Secondary | ICD-10-CM | POA: Diagnosis not present

## 2014-06-17 DIAGNOSIS — D631 Anemia in chronic kidney disease: Secondary | ICD-10-CM | POA: Diagnosis not present

## 2014-06-17 DIAGNOSIS — E119 Type 2 diabetes mellitus without complications: Secondary | ICD-10-CM | POA: Diagnosis not present

## 2014-06-17 DIAGNOSIS — N2581 Secondary hyperparathyroidism of renal origin: Secondary | ICD-10-CM | POA: Diagnosis not present

## 2014-06-19 DIAGNOSIS — D631 Anemia in chronic kidney disease: Secondary | ICD-10-CM | POA: Diagnosis not present

## 2014-06-19 DIAGNOSIS — D509 Iron deficiency anemia, unspecified: Secondary | ICD-10-CM | POA: Diagnosis not present

## 2014-06-19 DIAGNOSIS — N186 End stage renal disease: Secondary | ICD-10-CM | POA: Diagnosis not present

## 2014-06-19 DIAGNOSIS — E119 Type 2 diabetes mellitus without complications: Secondary | ICD-10-CM | POA: Diagnosis not present

## 2014-06-19 DIAGNOSIS — N2581 Secondary hyperparathyroidism of renal origin: Secondary | ICD-10-CM | POA: Diagnosis not present

## 2014-06-22 DIAGNOSIS — N2581 Secondary hyperparathyroidism of renal origin: Secondary | ICD-10-CM | POA: Diagnosis not present

## 2014-06-22 DIAGNOSIS — E119 Type 2 diabetes mellitus without complications: Secondary | ICD-10-CM | POA: Diagnosis not present

## 2014-06-22 DIAGNOSIS — D631 Anemia in chronic kidney disease: Secondary | ICD-10-CM | POA: Diagnosis not present

## 2014-06-22 DIAGNOSIS — N186 End stage renal disease: Secondary | ICD-10-CM | POA: Diagnosis not present

## 2014-06-22 DIAGNOSIS — D509 Iron deficiency anemia, unspecified: Secondary | ICD-10-CM | POA: Diagnosis not present

## 2014-06-24 DIAGNOSIS — D509 Iron deficiency anemia, unspecified: Secondary | ICD-10-CM | POA: Diagnosis not present

## 2014-06-24 DIAGNOSIS — N186 End stage renal disease: Secondary | ICD-10-CM | POA: Diagnosis not present

## 2014-06-24 DIAGNOSIS — N2581 Secondary hyperparathyroidism of renal origin: Secondary | ICD-10-CM | POA: Diagnosis not present

## 2014-06-24 DIAGNOSIS — D631 Anemia in chronic kidney disease: Secondary | ICD-10-CM | POA: Diagnosis not present

## 2014-06-24 DIAGNOSIS — E119 Type 2 diabetes mellitus without complications: Secondary | ICD-10-CM | POA: Diagnosis not present

## 2014-06-25 DIAGNOSIS — L600 Ingrowing nail: Secondary | ICD-10-CM | POA: Diagnosis not present

## 2014-06-26 DIAGNOSIS — D509 Iron deficiency anemia, unspecified: Secondary | ICD-10-CM | POA: Diagnosis not present

## 2014-06-26 DIAGNOSIS — N2581 Secondary hyperparathyroidism of renal origin: Secondary | ICD-10-CM | POA: Diagnosis not present

## 2014-06-26 DIAGNOSIS — N186 End stage renal disease: Secondary | ICD-10-CM | POA: Diagnosis not present

## 2014-06-26 DIAGNOSIS — D631 Anemia in chronic kidney disease: Secondary | ICD-10-CM | POA: Diagnosis not present

## 2014-06-26 DIAGNOSIS — E119 Type 2 diabetes mellitus without complications: Secondary | ICD-10-CM | POA: Diagnosis not present

## 2014-06-29 DIAGNOSIS — E119 Type 2 diabetes mellitus without complications: Secondary | ICD-10-CM | POA: Diagnosis not present

## 2014-06-29 DIAGNOSIS — N186 End stage renal disease: Secondary | ICD-10-CM | POA: Diagnosis not present

## 2014-06-29 DIAGNOSIS — N2581 Secondary hyperparathyroidism of renal origin: Secondary | ICD-10-CM | POA: Diagnosis not present

## 2014-06-29 DIAGNOSIS — D631 Anemia in chronic kidney disease: Secondary | ICD-10-CM | POA: Diagnosis not present

## 2014-06-29 DIAGNOSIS — D509 Iron deficiency anemia, unspecified: Secondary | ICD-10-CM | POA: Diagnosis not present

## 2014-07-01 DIAGNOSIS — N186 End stage renal disease: Secondary | ICD-10-CM | POA: Diagnosis not present

## 2014-07-01 DIAGNOSIS — N2581 Secondary hyperparathyroidism of renal origin: Secondary | ICD-10-CM | POA: Diagnosis not present

## 2014-07-01 DIAGNOSIS — D631 Anemia in chronic kidney disease: Secondary | ICD-10-CM | POA: Diagnosis not present

## 2014-07-01 DIAGNOSIS — D509 Iron deficiency anemia, unspecified: Secondary | ICD-10-CM | POA: Diagnosis not present

## 2014-07-01 DIAGNOSIS — E119 Type 2 diabetes mellitus without complications: Secondary | ICD-10-CM | POA: Diagnosis not present

## 2014-07-02 DIAGNOSIS — N186 End stage renal disease: Secondary | ICD-10-CM | POA: Diagnosis not present

## 2014-07-02 DIAGNOSIS — Z992 Dependence on renal dialysis: Secondary | ICD-10-CM | POA: Diagnosis not present

## 2014-07-02 DIAGNOSIS — E1129 Type 2 diabetes mellitus with other diabetic kidney complication: Secondary | ICD-10-CM | POA: Diagnosis not present

## 2014-07-03 DIAGNOSIS — N2581 Secondary hyperparathyroidism of renal origin: Secondary | ICD-10-CM | POA: Diagnosis not present

## 2014-07-03 DIAGNOSIS — E119 Type 2 diabetes mellitus without complications: Secondary | ICD-10-CM | POA: Diagnosis not present

## 2014-07-03 DIAGNOSIS — N186 End stage renal disease: Secondary | ICD-10-CM | POA: Diagnosis not present

## 2014-07-03 DIAGNOSIS — D631 Anemia in chronic kidney disease: Secondary | ICD-10-CM | POA: Diagnosis not present

## 2014-07-03 DIAGNOSIS — D509 Iron deficiency anemia, unspecified: Secondary | ICD-10-CM | POA: Diagnosis not present

## 2014-07-06 DIAGNOSIS — E119 Type 2 diabetes mellitus without complications: Secondary | ICD-10-CM | POA: Diagnosis not present

## 2014-07-06 DIAGNOSIS — N2581 Secondary hyperparathyroidism of renal origin: Secondary | ICD-10-CM | POA: Diagnosis not present

## 2014-07-06 DIAGNOSIS — D509 Iron deficiency anemia, unspecified: Secondary | ICD-10-CM | POA: Diagnosis not present

## 2014-07-06 DIAGNOSIS — N186 End stage renal disease: Secondary | ICD-10-CM | POA: Diagnosis not present

## 2014-07-06 DIAGNOSIS — D631 Anemia in chronic kidney disease: Secondary | ICD-10-CM | POA: Diagnosis not present

## 2014-07-08 DIAGNOSIS — D631 Anemia in chronic kidney disease: Secondary | ICD-10-CM | POA: Diagnosis not present

## 2014-07-08 DIAGNOSIS — N186 End stage renal disease: Secondary | ICD-10-CM | POA: Diagnosis not present

## 2014-07-08 DIAGNOSIS — E119 Type 2 diabetes mellitus without complications: Secondary | ICD-10-CM | POA: Diagnosis not present

## 2014-07-08 DIAGNOSIS — D509 Iron deficiency anemia, unspecified: Secondary | ICD-10-CM | POA: Diagnosis not present

## 2014-07-08 DIAGNOSIS — N2581 Secondary hyperparathyroidism of renal origin: Secondary | ICD-10-CM | POA: Diagnosis not present

## 2014-07-10 DIAGNOSIS — N2581 Secondary hyperparathyroidism of renal origin: Secondary | ICD-10-CM | POA: Diagnosis not present

## 2014-07-10 DIAGNOSIS — D631 Anemia in chronic kidney disease: Secondary | ICD-10-CM | POA: Diagnosis not present

## 2014-07-10 DIAGNOSIS — E119 Type 2 diabetes mellitus without complications: Secondary | ICD-10-CM | POA: Diagnosis not present

## 2014-07-10 DIAGNOSIS — D509 Iron deficiency anemia, unspecified: Secondary | ICD-10-CM | POA: Diagnosis not present

## 2014-07-10 DIAGNOSIS — N186 End stage renal disease: Secondary | ICD-10-CM | POA: Diagnosis not present

## 2014-07-13 DIAGNOSIS — E119 Type 2 diabetes mellitus without complications: Secondary | ICD-10-CM | POA: Diagnosis not present

## 2014-07-13 DIAGNOSIS — N2581 Secondary hyperparathyroidism of renal origin: Secondary | ICD-10-CM | POA: Diagnosis not present

## 2014-07-13 DIAGNOSIS — D631 Anemia in chronic kidney disease: Secondary | ICD-10-CM | POA: Diagnosis not present

## 2014-07-13 DIAGNOSIS — N186 End stage renal disease: Secondary | ICD-10-CM | POA: Diagnosis not present

## 2014-07-13 DIAGNOSIS — D509 Iron deficiency anemia, unspecified: Secondary | ICD-10-CM | POA: Diagnosis not present

## 2014-07-15 DIAGNOSIS — D631 Anemia in chronic kidney disease: Secondary | ICD-10-CM | POA: Diagnosis not present

## 2014-07-15 DIAGNOSIS — E119 Type 2 diabetes mellitus without complications: Secondary | ICD-10-CM | POA: Diagnosis not present

## 2014-07-15 DIAGNOSIS — N2581 Secondary hyperparathyroidism of renal origin: Secondary | ICD-10-CM | POA: Diagnosis not present

## 2014-07-15 DIAGNOSIS — N186 End stage renal disease: Secondary | ICD-10-CM | POA: Diagnosis not present

## 2014-07-15 DIAGNOSIS — D509 Iron deficiency anemia, unspecified: Secondary | ICD-10-CM | POA: Diagnosis not present

## 2014-07-17 DIAGNOSIS — D631 Anemia in chronic kidney disease: Secondary | ICD-10-CM | POA: Diagnosis not present

## 2014-07-17 DIAGNOSIS — E119 Type 2 diabetes mellitus without complications: Secondary | ICD-10-CM | POA: Diagnosis not present

## 2014-07-17 DIAGNOSIS — N186 End stage renal disease: Secondary | ICD-10-CM | POA: Diagnosis not present

## 2014-07-17 DIAGNOSIS — N2581 Secondary hyperparathyroidism of renal origin: Secondary | ICD-10-CM | POA: Diagnosis not present

## 2014-07-17 DIAGNOSIS — D509 Iron deficiency anemia, unspecified: Secondary | ICD-10-CM | POA: Diagnosis not present

## 2014-07-20 DIAGNOSIS — N2581 Secondary hyperparathyroidism of renal origin: Secondary | ICD-10-CM | POA: Diagnosis not present

## 2014-07-20 DIAGNOSIS — D509 Iron deficiency anemia, unspecified: Secondary | ICD-10-CM | POA: Diagnosis not present

## 2014-07-20 DIAGNOSIS — N186 End stage renal disease: Secondary | ICD-10-CM | POA: Diagnosis not present

## 2014-07-20 DIAGNOSIS — D631 Anemia in chronic kidney disease: Secondary | ICD-10-CM | POA: Diagnosis not present

## 2014-07-20 DIAGNOSIS — E119 Type 2 diabetes mellitus without complications: Secondary | ICD-10-CM | POA: Diagnosis not present

## 2014-07-22 DIAGNOSIS — E119 Type 2 diabetes mellitus without complications: Secondary | ICD-10-CM | POA: Diagnosis not present

## 2014-07-22 DIAGNOSIS — N186 End stage renal disease: Secondary | ICD-10-CM | POA: Diagnosis not present

## 2014-07-22 DIAGNOSIS — D509 Iron deficiency anemia, unspecified: Secondary | ICD-10-CM | POA: Diagnosis not present

## 2014-07-22 DIAGNOSIS — N2581 Secondary hyperparathyroidism of renal origin: Secondary | ICD-10-CM | POA: Diagnosis not present

## 2014-07-22 DIAGNOSIS — D631 Anemia in chronic kidney disease: Secondary | ICD-10-CM | POA: Diagnosis not present

## 2014-07-24 DIAGNOSIS — N186 End stage renal disease: Secondary | ICD-10-CM | POA: Diagnosis not present

## 2014-07-24 DIAGNOSIS — D509 Iron deficiency anemia, unspecified: Secondary | ICD-10-CM | POA: Diagnosis not present

## 2014-07-24 DIAGNOSIS — E119 Type 2 diabetes mellitus without complications: Secondary | ICD-10-CM | POA: Diagnosis not present

## 2014-07-24 DIAGNOSIS — N2581 Secondary hyperparathyroidism of renal origin: Secondary | ICD-10-CM | POA: Diagnosis not present

## 2014-07-24 DIAGNOSIS — D631 Anemia in chronic kidney disease: Secondary | ICD-10-CM | POA: Diagnosis not present

## 2014-07-27 DIAGNOSIS — E119 Type 2 diabetes mellitus without complications: Secondary | ICD-10-CM | POA: Diagnosis not present

## 2014-07-27 DIAGNOSIS — D509 Iron deficiency anemia, unspecified: Secondary | ICD-10-CM | POA: Diagnosis not present

## 2014-07-27 DIAGNOSIS — N2581 Secondary hyperparathyroidism of renal origin: Secondary | ICD-10-CM | POA: Diagnosis not present

## 2014-07-27 DIAGNOSIS — D631 Anemia in chronic kidney disease: Secondary | ICD-10-CM | POA: Diagnosis not present

## 2014-07-27 DIAGNOSIS — N186 End stage renal disease: Secondary | ICD-10-CM | POA: Diagnosis not present

## 2014-07-29 DIAGNOSIS — D631 Anemia in chronic kidney disease: Secondary | ICD-10-CM | POA: Diagnosis not present

## 2014-07-29 DIAGNOSIS — E119 Type 2 diabetes mellitus without complications: Secondary | ICD-10-CM | POA: Diagnosis not present

## 2014-07-29 DIAGNOSIS — N2581 Secondary hyperparathyroidism of renal origin: Secondary | ICD-10-CM | POA: Diagnosis not present

## 2014-07-29 DIAGNOSIS — N186 End stage renal disease: Secondary | ICD-10-CM | POA: Diagnosis not present

## 2014-07-29 DIAGNOSIS — D509 Iron deficiency anemia, unspecified: Secondary | ICD-10-CM | POA: Diagnosis not present

## 2014-07-31 DIAGNOSIS — E119 Type 2 diabetes mellitus without complications: Secondary | ICD-10-CM | POA: Diagnosis not present

## 2014-07-31 DIAGNOSIS — N186 End stage renal disease: Secondary | ICD-10-CM | POA: Diagnosis not present

## 2014-07-31 DIAGNOSIS — D509 Iron deficiency anemia, unspecified: Secondary | ICD-10-CM | POA: Diagnosis not present

## 2014-07-31 DIAGNOSIS — D631 Anemia in chronic kidney disease: Secondary | ICD-10-CM | POA: Diagnosis not present

## 2014-07-31 DIAGNOSIS — N2581 Secondary hyperparathyroidism of renal origin: Secondary | ICD-10-CM | POA: Diagnosis not present

## 2014-08-02 DIAGNOSIS — E1129 Type 2 diabetes mellitus with other diabetic kidney complication: Secondary | ICD-10-CM | POA: Diagnosis not present

## 2014-08-02 DIAGNOSIS — N186 End stage renal disease: Secondary | ICD-10-CM | POA: Diagnosis not present

## 2014-08-02 DIAGNOSIS — Z992 Dependence on renal dialysis: Secondary | ICD-10-CM | POA: Diagnosis not present

## 2014-08-03 DIAGNOSIS — D509 Iron deficiency anemia, unspecified: Secondary | ICD-10-CM | POA: Diagnosis not present

## 2014-08-03 DIAGNOSIS — N186 End stage renal disease: Secondary | ICD-10-CM | POA: Diagnosis not present

## 2014-08-03 DIAGNOSIS — D631 Anemia in chronic kidney disease: Secondary | ICD-10-CM | POA: Diagnosis not present

## 2014-08-03 DIAGNOSIS — N2581 Secondary hyperparathyroidism of renal origin: Secondary | ICD-10-CM | POA: Diagnosis not present

## 2014-08-03 DIAGNOSIS — E119 Type 2 diabetes mellitus without complications: Secondary | ICD-10-CM | POA: Diagnosis not present

## 2014-08-05 DIAGNOSIS — D631 Anemia in chronic kidney disease: Secondary | ICD-10-CM | POA: Diagnosis not present

## 2014-08-05 DIAGNOSIS — N2581 Secondary hyperparathyroidism of renal origin: Secondary | ICD-10-CM | POA: Diagnosis not present

## 2014-08-05 DIAGNOSIS — N186 End stage renal disease: Secondary | ICD-10-CM | POA: Diagnosis not present

## 2014-08-05 DIAGNOSIS — D509 Iron deficiency anemia, unspecified: Secondary | ICD-10-CM | POA: Diagnosis not present

## 2014-08-05 DIAGNOSIS — E119 Type 2 diabetes mellitus without complications: Secondary | ICD-10-CM | POA: Diagnosis not present

## 2014-08-07 DIAGNOSIS — N186 End stage renal disease: Secondary | ICD-10-CM | POA: Diagnosis not present

## 2014-08-07 DIAGNOSIS — E119 Type 2 diabetes mellitus without complications: Secondary | ICD-10-CM | POA: Diagnosis not present

## 2014-08-07 DIAGNOSIS — D509 Iron deficiency anemia, unspecified: Secondary | ICD-10-CM | POA: Diagnosis not present

## 2014-08-07 DIAGNOSIS — D631 Anemia in chronic kidney disease: Secondary | ICD-10-CM | POA: Diagnosis not present

## 2014-08-07 DIAGNOSIS — N2581 Secondary hyperparathyroidism of renal origin: Secondary | ICD-10-CM | POA: Diagnosis not present

## 2014-08-10 DIAGNOSIS — E119 Type 2 diabetes mellitus without complications: Secondary | ICD-10-CM | POA: Diagnosis not present

## 2014-08-10 DIAGNOSIS — D631 Anemia in chronic kidney disease: Secondary | ICD-10-CM | POA: Diagnosis not present

## 2014-08-10 DIAGNOSIS — N186 End stage renal disease: Secondary | ICD-10-CM | POA: Diagnosis not present

## 2014-08-10 DIAGNOSIS — D509 Iron deficiency anemia, unspecified: Secondary | ICD-10-CM | POA: Diagnosis not present

## 2014-08-10 DIAGNOSIS — N2581 Secondary hyperparathyroidism of renal origin: Secondary | ICD-10-CM | POA: Diagnosis not present

## 2014-08-11 DIAGNOSIS — R5383 Other fatigue: Secondary | ICD-10-CM | POA: Diagnosis not present

## 2014-08-12 DIAGNOSIS — N186 End stage renal disease: Secondary | ICD-10-CM | POA: Diagnosis not present

## 2014-08-12 DIAGNOSIS — N2581 Secondary hyperparathyroidism of renal origin: Secondary | ICD-10-CM | POA: Diagnosis not present

## 2014-08-12 DIAGNOSIS — E119 Type 2 diabetes mellitus without complications: Secondary | ICD-10-CM | POA: Diagnosis not present

## 2014-08-12 DIAGNOSIS — D631 Anemia in chronic kidney disease: Secondary | ICD-10-CM | POA: Diagnosis not present

## 2014-08-12 DIAGNOSIS — D509 Iron deficiency anemia, unspecified: Secondary | ICD-10-CM | POA: Diagnosis not present

## 2014-08-14 DIAGNOSIS — N186 End stage renal disease: Secondary | ICD-10-CM | POA: Diagnosis not present

## 2014-08-14 DIAGNOSIS — N2581 Secondary hyperparathyroidism of renal origin: Secondary | ICD-10-CM | POA: Diagnosis not present

## 2014-08-14 DIAGNOSIS — E119 Type 2 diabetes mellitus without complications: Secondary | ICD-10-CM | POA: Diagnosis not present

## 2014-08-14 DIAGNOSIS — D509 Iron deficiency anemia, unspecified: Secondary | ICD-10-CM | POA: Diagnosis not present

## 2014-08-14 DIAGNOSIS — D631 Anemia in chronic kidney disease: Secondary | ICD-10-CM | POA: Diagnosis not present

## 2014-08-17 DIAGNOSIS — D631 Anemia in chronic kidney disease: Secondary | ICD-10-CM | POA: Diagnosis not present

## 2014-08-17 DIAGNOSIS — E119 Type 2 diabetes mellitus without complications: Secondary | ICD-10-CM | POA: Diagnosis not present

## 2014-08-17 DIAGNOSIS — D509 Iron deficiency anemia, unspecified: Secondary | ICD-10-CM | POA: Diagnosis not present

## 2014-08-17 DIAGNOSIS — N2581 Secondary hyperparathyroidism of renal origin: Secondary | ICD-10-CM | POA: Diagnosis not present

## 2014-08-17 DIAGNOSIS — N186 End stage renal disease: Secondary | ICD-10-CM | POA: Diagnosis not present

## 2014-08-19 DIAGNOSIS — N2581 Secondary hyperparathyroidism of renal origin: Secondary | ICD-10-CM | POA: Diagnosis not present

## 2014-08-19 DIAGNOSIS — D631 Anemia in chronic kidney disease: Secondary | ICD-10-CM | POA: Diagnosis not present

## 2014-08-19 DIAGNOSIS — E119 Type 2 diabetes mellitus without complications: Secondary | ICD-10-CM | POA: Diagnosis not present

## 2014-08-19 DIAGNOSIS — D509 Iron deficiency anemia, unspecified: Secondary | ICD-10-CM | POA: Diagnosis not present

## 2014-08-19 DIAGNOSIS — N186 End stage renal disease: Secondary | ICD-10-CM | POA: Diagnosis not present

## 2014-08-21 DIAGNOSIS — N186 End stage renal disease: Secondary | ICD-10-CM | POA: Diagnosis not present

## 2014-08-21 DIAGNOSIS — N2581 Secondary hyperparathyroidism of renal origin: Secondary | ICD-10-CM | POA: Diagnosis not present

## 2014-08-21 DIAGNOSIS — D509 Iron deficiency anemia, unspecified: Secondary | ICD-10-CM | POA: Diagnosis not present

## 2014-08-21 DIAGNOSIS — D631 Anemia in chronic kidney disease: Secondary | ICD-10-CM | POA: Diagnosis not present

## 2014-08-21 DIAGNOSIS — E119 Type 2 diabetes mellitus without complications: Secondary | ICD-10-CM | POA: Diagnosis not present

## 2014-08-24 DIAGNOSIS — N186 End stage renal disease: Secondary | ICD-10-CM | POA: Diagnosis not present

## 2014-08-24 DIAGNOSIS — E119 Type 2 diabetes mellitus without complications: Secondary | ICD-10-CM | POA: Diagnosis not present

## 2014-08-24 DIAGNOSIS — N2581 Secondary hyperparathyroidism of renal origin: Secondary | ICD-10-CM | POA: Diagnosis not present

## 2014-08-24 DIAGNOSIS — D631 Anemia in chronic kidney disease: Secondary | ICD-10-CM | POA: Diagnosis not present

## 2014-08-24 DIAGNOSIS — D509 Iron deficiency anemia, unspecified: Secondary | ICD-10-CM | POA: Diagnosis not present

## 2014-08-25 DIAGNOSIS — Z992 Dependence on renal dialysis: Secondary | ICD-10-CM | POA: Diagnosis not present

## 2014-08-25 DIAGNOSIS — T82858D Stenosis of vascular prosthetic devices, implants and grafts, subsequent encounter: Secondary | ICD-10-CM | POA: Diagnosis not present

## 2014-08-25 DIAGNOSIS — N186 End stage renal disease: Secondary | ICD-10-CM | POA: Diagnosis not present

## 2014-08-25 DIAGNOSIS — I871 Compression of vein: Secondary | ICD-10-CM | POA: Diagnosis not present

## 2014-08-26 DIAGNOSIS — D631 Anemia in chronic kidney disease: Secondary | ICD-10-CM | POA: Diagnosis not present

## 2014-08-26 DIAGNOSIS — D509 Iron deficiency anemia, unspecified: Secondary | ICD-10-CM | POA: Diagnosis not present

## 2014-08-26 DIAGNOSIS — N2581 Secondary hyperparathyroidism of renal origin: Secondary | ICD-10-CM | POA: Diagnosis not present

## 2014-08-26 DIAGNOSIS — E119 Type 2 diabetes mellitus without complications: Secondary | ICD-10-CM | POA: Diagnosis not present

## 2014-08-26 DIAGNOSIS — N186 End stage renal disease: Secondary | ICD-10-CM | POA: Diagnosis not present

## 2014-08-28 DIAGNOSIS — D631 Anemia in chronic kidney disease: Secondary | ICD-10-CM | POA: Diagnosis not present

## 2014-08-28 DIAGNOSIS — D509 Iron deficiency anemia, unspecified: Secondary | ICD-10-CM | POA: Diagnosis not present

## 2014-08-28 DIAGNOSIS — E119 Type 2 diabetes mellitus without complications: Secondary | ICD-10-CM | POA: Diagnosis not present

## 2014-08-28 DIAGNOSIS — N2581 Secondary hyperparathyroidism of renal origin: Secondary | ICD-10-CM | POA: Diagnosis not present

## 2014-08-28 DIAGNOSIS — N186 End stage renal disease: Secondary | ICD-10-CM | POA: Diagnosis not present

## 2014-08-31 ENCOUNTER — Telehealth: Payer: Self-pay | Admitting: Pulmonary Disease

## 2014-08-31 DIAGNOSIS — D631 Anemia in chronic kidney disease: Secondary | ICD-10-CM | POA: Diagnosis not present

## 2014-08-31 DIAGNOSIS — D509 Iron deficiency anemia, unspecified: Secondary | ICD-10-CM | POA: Diagnosis not present

## 2014-08-31 DIAGNOSIS — N186 End stage renal disease: Secondary | ICD-10-CM | POA: Diagnosis not present

## 2014-08-31 DIAGNOSIS — E119 Type 2 diabetes mellitus without complications: Secondary | ICD-10-CM | POA: Diagnosis not present

## 2014-08-31 DIAGNOSIS — N2581 Secondary hyperparathyroidism of renal origin: Secondary | ICD-10-CM | POA: Diagnosis not present

## 2014-08-31 NOTE — Telephone Encounter (Signed)
Called spoke with [name] w/ [facility]. Pt informed her that she had labs done on at [facility] ([address]) on 08/11/14. I don't see anything in epic. Caryl Pina did you receive these results?

## 2014-09-02 DIAGNOSIS — Z880 Allergy status to penicillin: Secondary | ICD-10-CM

## 2014-09-02 DIAGNOSIS — Z9115 Patient's noncompliance with renal dialysis: Secondary | ICD-10-CM | POA: Diagnosis present

## 2014-09-02 DIAGNOSIS — Z905 Acquired absence of kidney: Secondary | ICD-10-CM | POA: Diagnosis present

## 2014-09-02 DIAGNOSIS — I272 Other secondary pulmonary hypertension: Secondary | ICD-10-CM | POA: Diagnosis not present

## 2014-09-02 DIAGNOSIS — I739 Peripheral vascular disease, unspecified: Secondary | ICD-10-CM | POA: Diagnosis present

## 2014-09-02 DIAGNOSIS — F17210 Nicotine dependence, cigarettes, uncomplicated: Secondary | ICD-10-CM | POA: Diagnosis not present

## 2014-09-02 DIAGNOSIS — I120 Hypertensive chronic kidney disease with stage 5 chronic kidney disease or end stage renal disease: Secondary | ICD-10-CM | POA: Diagnosis present

## 2014-09-02 DIAGNOSIS — Z7982 Long term (current) use of aspirin: Secondary | ICD-10-CM

## 2014-09-02 DIAGNOSIS — E875 Hyperkalemia: Principal | ICD-10-CM | POA: Diagnosis present

## 2014-09-02 DIAGNOSIS — F329 Major depressive disorder, single episode, unspecified: Secondary | ICD-10-CM | POA: Diagnosis present

## 2014-09-02 DIAGNOSIS — G40909 Epilepsy, unspecified, not intractable, without status epilepticus: Secondary | ICD-10-CM | POA: Diagnosis present

## 2014-09-02 DIAGNOSIS — Z9114 Patient's other noncompliance with medication regimen: Secondary | ICD-10-CM | POA: Diagnosis present

## 2014-09-02 DIAGNOSIS — A084 Viral intestinal infection, unspecified: Secondary | ICD-10-CM | POA: Diagnosis not present

## 2014-09-02 DIAGNOSIS — I2510 Atherosclerotic heart disease of native coronary artery without angina pectoris: Secondary | ICD-10-CM | POA: Diagnosis present

## 2014-09-02 DIAGNOSIS — Z888 Allergy status to other drugs, medicaments and biological substances status: Secondary | ICD-10-CM

## 2014-09-02 DIAGNOSIS — Z8673 Personal history of transient ischemic attack (TIA), and cerebral infarction without residual deficits: Secondary | ICD-10-CM

## 2014-09-02 DIAGNOSIS — I5042 Chronic combined systolic (congestive) and diastolic (congestive) heart failure: Secondary | ICD-10-CM | POA: Diagnosis not present

## 2014-09-02 DIAGNOSIS — E039 Hypothyroidism, unspecified: Secondary | ICD-10-CM | POA: Diagnosis present

## 2014-09-02 DIAGNOSIS — D631 Anemia in chronic kidney disease: Secondary | ICD-10-CM | POA: Diagnosis present

## 2014-09-02 DIAGNOSIS — N186 End stage renal disease: Secondary | ICD-10-CM | POA: Diagnosis present

## 2014-09-02 DIAGNOSIS — Z881 Allergy status to other antibiotic agents status: Secondary | ICD-10-CM

## 2014-09-02 DIAGNOSIS — Z992 Dependence on renal dialysis: Secondary | ICD-10-CM | POA: Diagnosis not present

## 2014-09-02 DIAGNOSIS — E1129 Type 2 diabetes mellitus with other diabetic kidney complication: Secondary | ICD-10-CM | POA: Diagnosis not present

## 2014-09-02 DIAGNOSIS — E785 Hyperlipidemia, unspecified: Secondary | ICD-10-CM | POA: Diagnosis present

## 2014-09-02 DIAGNOSIS — Z9111 Patient's noncompliance with dietary regimen: Secondary | ICD-10-CM | POA: Diagnosis present

## 2014-09-02 DIAGNOSIS — Z79899 Other long term (current) drug therapy: Secondary | ICD-10-CM

## 2014-09-02 DIAGNOSIS — J449 Chronic obstructive pulmonary disease, unspecified: Secondary | ICD-10-CM | POA: Diagnosis present

## 2014-09-02 NOTE — Telephone Encounter (Signed)
[name] [phone] ext [phone] States they have not been able to contact patient, and will just follow up when next labs are due

## 2014-09-02 NOTE — Telephone Encounter (Signed)
I have not yet received any labs on this patient.

## 2014-09-03 ENCOUNTER — Inpatient Hospital Stay (HOSPITAL_COMMUNITY): Payer: Medicare Other

## 2014-09-03 ENCOUNTER — Encounter (HOSPITAL_COMMUNITY): Payer: Self-pay | Admitting: *Deleted

## 2014-09-03 ENCOUNTER — Inpatient Hospital Stay (HOSPITAL_COMMUNITY)
Admission: EM | Admit: 2014-09-03 | Discharge: 2014-09-04 | DRG: 640 | Disposition: A | Discharge status: Home / Self Care | Payer: Medicare Other | Attending: Internal Medicine | Admitting: Internal Medicine

## 2014-09-03 DIAGNOSIS — Z905 Acquired absence of kidney: Secondary | ICD-10-CM | POA: Diagnosis present

## 2014-09-03 DIAGNOSIS — F329 Major depressive disorder, single episode, unspecified: Secondary | ICD-10-CM | POA: Diagnosis present

## 2014-09-03 DIAGNOSIS — Z7982 Long term (current) use of aspirin: Secondary | ICD-10-CM | POA: Diagnosis not present

## 2014-09-03 DIAGNOSIS — I120 Hypertensive chronic kidney disease with stage 5 chronic kidney disease or end stage renal disease: Secondary | ICD-10-CM | POA: Diagnosis present

## 2014-09-03 DIAGNOSIS — I2510 Atherosclerotic heart disease of native coronary artery without angina pectoris: Secondary | ICD-10-CM | POA: Diagnosis present

## 2014-09-03 DIAGNOSIS — R112 Nausea with vomiting, unspecified: Secondary | ICD-10-CM | POA: Diagnosis not present

## 2014-09-03 DIAGNOSIS — N186 End stage renal disease: Secondary | ICD-10-CM

## 2014-09-03 DIAGNOSIS — E875 Hyperkalemia: Secondary | ICD-10-CM | POA: Diagnosis not present

## 2014-09-03 DIAGNOSIS — G40909 Epilepsy, unspecified, not intractable, without status epilepticus: Secondary | ICD-10-CM | POA: Diagnosis present

## 2014-09-03 DIAGNOSIS — I5032 Chronic diastolic (congestive) heart failure: Secondary | ICD-10-CM | POA: Diagnosis not present

## 2014-09-03 DIAGNOSIS — I270 Primary pulmonary hypertension: Secondary | ICD-10-CM

## 2014-09-03 DIAGNOSIS — A084 Viral intestinal infection, unspecified: Secondary | ICD-10-CM | POA: Diagnosis present

## 2014-09-03 DIAGNOSIS — I739 Peripheral vascular disease, unspecified: Secondary | ICD-10-CM | POA: Diagnosis present

## 2014-09-03 DIAGNOSIS — Z8673 Personal history of transient ischemic attack (TIA), and cerebral infarction without residual deficits: Secondary | ICD-10-CM | POA: Diagnosis not present

## 2014-09-03 DIAGNOSIS — R197 Diarrhea, unspecified: Secondary | ICD-10-CM | POA: Diagnosis present

## 2014-09-03 DIAGNOSIS — F17210 Nicotine dependence, cigarettes, uncomplicated: Secondary | ICD-10-CM | POA: Diagnosis present

## 2014-09-03 DIAGNOSIS — Z9114 Patient's other noncompliance with medication regimen: Secondary | ICD-10-CM | POA: Diagnosis present

## 2014-09-03 DIAGNOSIS — I272 Other secondary pulmonary hypertension: Secondary | ICD-10-CM | POA: Diagnosis present

## 2014-09-03 DIAGNOSIS — Z880 Allergy status to penicillin: Secondary | ICD-10-CM | POA: Diagnosis not present

## 2014-09-03 DIAGNOSIS — D631 Anemia in chronic kidney disease: Secondary | ICD-10-CM | POA: Diagnosis present

## 2014-09-03 DIAGNOSIS — Z9111 Patient's noncompliance with dietary regimen: Secondary | ICD-10-CM | POA: Diagnosis present

## 2014-09-03 DIAGNOSIS — Z888 Allergy status to other drugs, medicaments and biological substances status: Secondary | ICD-10-CM | POA: Diagnosis not present

## 2014-09-03 DIAGNOSIS — E785 Hyperlipidemia, unspecified: Secondary | ICD-10-CM | POA: Diagnosis present

## 2014-09-03 DIAGNOSIS — Z79899 Other long term (current) drug therapy: Secondary | ICD-10-CM | POA: Diagnosis not present

## 2014-09-03 DIAGNOSIS — E039 Hypothyroidism, unspecified: Secondary | ICD-10-CM | POA: Diagnosis present

## 2014-09-03 DIAGNOSIS — Z9115 Patient's noncompliance with renal dialysis: Secondary | ICD-10-CM | POA: Diagnosis present

## 2014-09-03 DIAGNOSIS — R109 Unspecified abdominal pain: Secondary | ICD-10-CM | POA: Diagnosis not present

## 2014-09-03 DIAGNOSIS — R918 Other nonspecific abnormal finding of lung field: Secondary | ICD-10-CM | POA: Diagnosis not present

## 2014-09-03 DIAGNOSIS — Z881 Allergy status to other antibiotic agents status: Secondary | ICD-10-CM | POA: Diagnosis not present

## 2014-09-03 DIAGNOSIS — I5042 Chronic combined systolic (congestive) and diastolic (congestive) heart failure: Secondary | ICD-10-CM | POA: Diagnosis present

## 2014-09-03 DIAGNOSIS — J449 Chronic obstructive pulmonary disease, unspecified: Secondary | ICD-10-CM | POA: Diagnosis present

## 2014-09-03 DIAGNOSIS — Z992 Dependence on renal dialysis: Secondary | ICD-10-CM | POA: Diagnosis not present

## 2014-09-03 DIAGNOSIS — I10 Essential (primary) hypertension: Secondary | ICD-10-CM | POA: Diagnosis present

## 2014-09-03 LAB — COMPREHENSIVE METABOLIC PANEL
ALT: 12 U/L — ABNORMAL LOW (ref 14–54)
ALT: 11 U/L — ABNORMAL LOW (ref 14–54)
ANION GAP: 12 (ref 5–15)
AST: 15 U/L (ref 15–41)
AST: 20 U/L (ref 15–41)
Albumin: 3.2 g/dL — ABNORMAL LOW (ref 3.5–5.0)
Albumin: 3 g/dL — ABNORMAL LOW (ref 3.5–5.0)
Alkaline Phosphatase: 97 U/L (ref 38–126)
Alkaline Phosphatase: 98 U/L (ref 38–126)
Anion gap: 10 (ref 5–15)
BUN: 49 mg/dL — ABNORMAL HIGH (ref 6–20)
BUN: 15 mg/dL (ref 6–20)
CHLORIDE: 96 mmol/L — AB (ref 101–111)
CHLORIDE: 97 mmol/L — AB (ref 101–111)
CO2: 27 mmol/L (ref 22–32)
CO2: 30 mmol/L (ref 22–32)
Calcium: 9 mg/dL (ref 8.9–10.3)
Calcium: 8.6 mg/dL — ABNORMAL LOW (ref 8.9–10.3)
Creatinine, Ser: 12.03 mg/dL — ABNORMAL HIGH (ref 0.44–1.00)
Creatinine, Ser: 5.66 mg/dL — ABNORMAL HIGH (ref 0.44–1.00)
GFR calc non Af Amer: 3 mL/min — ABNORMAL LOW (ref >60)
GFR, EST AFRICAN AMERICAN: 3 mL/min — AB (ref >60)
GFR, EST AFRICAN AMERICAN: 8 mL/min — AB (ref >60)
GFR, EST NON AFRICAN AMERICAN: 7 mL/min — AB (ref >60)
Glucose, Bld: 106 mg/dL — ABNORMAL HIGH (ref 65–99)
Glucose, Bld: 99 mg/dL (ref 65–99)
POTASSIUM: 4.4 mmol/L (ref 3.5–5.1)
POTASSIUM: 6.8 mmol/L — AB (ref 3.5–5.1)
SODIUM: 135 mmol/L (ref 135–145)
Sodium: 137 mmol/L (ref 135–145)
Total Bilirubin: 0.7 mg/dL (ref 0.3–1.2)
Total Bilirubin: 0.9 mg/dL (ref 0.3–1.2)
Total Protein: 6.2 g/dL — ABNORMAL LOW (ref 6.5–8.1)
Total Protein: 6.4 g/dL — ABNORMAL LOW (ref 6.5–8.1)

## 2014-09-03 LAB — CBC WITH DIFFERENTIAL/PLATELET
Basophils Absolute: 0 10*3/uL (ref 0.0–0.1)
Basophils Relative: 1 % (ref 0–1)
Eosinophils Absolute: 0.2 10*3/uL (ref 0.0–0.7)
Eosinophils Relative: 3 % (ref 0–5)
HCT: 31.6 % — ABNORMAL LOW (ref 36.0–46.0)
HEMOGLOBIN: 10.2 g/dL — AB (ref 12.0–15.0)
LYMPHS ABS: 0.8 10*3/uL (ref 0.7–4.0)
LYMPHS PCT: 16 % (ref 12–46)
MCH: 27 pg (ref 26.0–34.0)
MCHC: 32.3 g/dL (ref 30.0–36.0)
MCV: 83.6 fL (ref 78.0–100.0)
Monocytes Absolute: 0.7 10*3/uL (ref 0.1–1.0)
Monocytes Relative: 15 % — ABNORMAL HIGH (ref 3–12)
NEUTROS ABS: 3.3 10*3/uL (ref 1.7–7.7)
NEUTROS PCT: 65 % (ref 43–77)
Platelets: 155 10*3/uL (ref 150–400)
RBC: 3.78 MIL/uL — AB (ref 3.87–5.11)
RDW: 14.4 % (ref 11.5–15.5)
WBC: 5 10*3/uL (ref 4.0–10.5)

## 2014-09-03 LAB — RENAL FUNCTION PANEL
ALBUMIN: 3.1 g/dL — AB (ref 3.5–5.0)
ALBUMIN: 3 g/dL — AB (ref 3.5–5.0)
ANION GAP: 13 (ref 5–15)
Anion gap: 11 (ref 5–15)
BUN: 51 mg/dL — AB (ref 6–20)
BUN: 53 mg/dL — ABNORMAL HIGH (ref 6–20)
CALCIUM: 9.3 mg/dL (ref 8.9–10.3)
CO2: 25 mmol/L (ref 22–32)
CO2: 27 mmol/L (ref 22–32)
Calcium: 14.4 mg/dL (ref 8.9–10.3)
Chloride: 98 mmol/L — ABNORMAL LOW (ref 101–111)
Chloride: 97 mmol/L — ABNORMAL LOW (ref 101–111)
Creatinine, Ser: 11.51 mg/dL — ABNORMAL HIGH (ref 0.44–1.00)
Creatinine, Ser: 12.09 mg/dL — ABNORMAL HIGH (ref 0.44–1.00)
GFR calc Af Amer: 3 mL/min — ABNORMAL LOW (ref >60)
GFR calc non Af Amer: 3 mL/min — ABNORMAL LOW (ref >60)
GFR calc non Af Amer: 3 mL/min — ABNORMAL LOW (ref >60)
GFR, EST AFRICAN AMERICAN: 3 mL/min — AB (ref >60)
GLUCOSE: 55 mg/dL — AB (ref 65–99)
Glucose, Bld: 58 mg/dL — ABNORMAL LOW (ref 65–99)
PHOSPHORUS: 4.1 mg/dL (ref 2.5–4.6)
PHOSPHORUS: 4.5 mg/dL (ref 2.5–4.6)
POTASSIUM: 5.3 mmol/L — AB (ref 3.5–5.1)
POTASSIUM: 5.4 mmol/L — AB (ref 3.5–5.1)
SODIUM: 137 mmol/L (ref 135–145)
Sodium: 134 mmol/L — ABNORMAL LOW (ref 135–145)

## 2014-09-03 LAB — CBC
HEMATOCRIT: 28.7 % — AB (ref 36.0–46.0)
HEMATOCRIT: 30.6 % — AB (ref 36.0–46.0)
HEMATOCRIT: 31.2 % — AB (ref 36.0–46.0)
HEMOGLOBIN: 9.4 g/dL — AB (ref 12.0–15.0)
HEMOGLOBIN: 9.9 g/dL — AB (ref 12.0–15.0)
Hemoglobin: 10.2 g/dL — ABNORMAL LOW (ref 12.0–15.0)
MCH: 27 pg (ref 26.0–34.0)
MCH: 27.2 pg (ref 26.0–34.0)
MCH: 27.4 pg (ref 26.0–34.0)
MCHC: 32.4 g/dL (ref 30.0–36.0)
MCHC: 32.7 g/dL (ref 30.0–36.0)
MCHC: 32.8 g/dL (ref 30.0–36.0)
MCV: 82.9 fL (ref 78.0–100.0)
MCV: 83.4 fL (ref 78.0–100.0)
MCV: 83.9 fL (ref 78.0–100.0)
PLATELETS: 132 10*3/uL — AB (ref 150–400)
Platelets: 159 10*3/uL (ref 150–400)
Platelets: 163 10*3/uL (ref 150–400)
RBC: 3.46 MIL/uL — AB (ref 3.87–5.11)
RBC: 3.67 MIL/uL — AB (ref 3.87–5.11)
RBC: 3.72 MIL/uL — AB (ref 3.87–5.11)
RDW: 14.4 % (ref 11.5–15.5)
RDW: 14.4 % (ref 11.5–15.5)
RDW: 14.5 % (ref 11.5–15.5)
WBC: 6 10*3/uL (ref 4.0–10.5)
WBC: 6.7 10*3/uL (ref 4.0–10.5)
WBC: 6.8 10*3/uL (ref 4.0–10.5)

## 2014-09-03 LAB — URINALYSIS, ROUTINE W REFLEX MICROSCOPIC
Bilirubin Urine: NEGATIVE
GLUCOSE, UA: 250 mg/dL — AB
Ketones, ur: NEGATIVE mg/dL
LEUKOCYTES UA: NEGATIVE
Nitrite: NEGATIVE
PH: 5.5 (ref 5.0–8.0)
PROTEIN: 100 mg/dL — AB
Specific Gravity, Urine: 1.01 (ref 1.005–1.030)
Urobilinogen, UA: 0.2 mg/dL (ref 0.0–1.0)

## 2014-09-03 LAB — URINE MICROSCOPIC-ADD ON

## 2014-09-03 LAB — LIPASE, BLOOD: LIPASE: 27 U/L (ref 22–51)

## 2014-09-03 MED ORDER — ALBUTEROL SULFATE (2.5 MG/3ML) 0.083% IN NEBU: 2.5000 mg | INHALATION_SOLUTION | Freq: Four times a day (QID) | RESPIRATORY_TRACT | Status: DC | PRN

## 2014-09-03 MED ORDER — HYDROXYZINE HCL 25 MG PO TABS
25.0000 mg | ORAL_TABLET | Freq: Three times a day (TID) | ORAL | Status: DC | PRN
  Administered 2014-09-03 – 2014-09-04 (×2): 25 mg via ORAL
  Filled 2014-09-03 (×2): qty 1

## 2014-09-03 MED ORDER — RENA-VITE PO TABS
1.0000 | ORAL_TABLET | Freq: Every day | ORAL | Status: DC
  Administered 2014-09-03 – 2014-09-04 (×2): 1 via ORAL
  Filled 2014-09-03 (×3): qty 1

## 2014-09-03 MED ORDER — SODIUM CHLORIDE 0.9 % IV SOLN
1.0000 g | Freq: Once | INTRAVENOUS | Status: AC
  Administered 2014-09-03: 1 g via INTRAVENOUS
  Filled 2014-09-03: qty 10

## 2014-09-03 MED ORDER — ONDANSETRON HCL 4 MG/2ML IJ SOLN
4.0000 mg | Freq: Four times a day (QID) | INTRAMUSCULAR | Status: DC | PRN
  Administered 2014-09-03: 4 mg via INTRAVENOUS
  Filled 2014-09-03: qty 2

## 2014-09-03 MED ORDER — LIDOCAINE-PRILOCAINE 2.5-2.5 % EX CREA: 1.0000 "application " | TOPICAL_CREAM | CUTANEOUS | Status: DC | PRN

## 2014-09-03 MED ORDER — SODIUM CHLORIDE 0.9 % IV SOLN: 100.0000 mL | INTRAVENOUS | Status: DC | PRN

## 2014-09-03 MED ORDER — ALTEPLASE 2 MG IJ SOLR
2.0000 mg | Freq: Once | INTRAMUSCULAR | Status: DC | PRN
  Filled 2014-09-03: qty 2

## 2014-09-03 MED ORDER — ISOSORBIDE MONONITRATE ER 30 MG PO TB24
30.0000 mg | ORAL_TABLET | Freq: Every day | ORAL | Status: DC
  Administered 2014-09-03 – 2014-09-04 (×2): 30 mg via ORAL
  Filled 2014-09-03 (×2): qty 1

## 2014-09-03 MED ORDER — LIDOCAINE HCL (PF) 1 % IJ SOLN: 5.0000 mL | INTRAMUSCULAR | Status: DC | PRN

## 2014-09-03 MED ORDER — PENTAFLUOROPROP-TETRAFLUOROETH EX AERO: 1.0000 "application " | INHALATION_SPRAY | CUTANEOUS | Status: DC | PRN

## 2014-09-03 MED ORDER — ALBUTEROL SULFATE HFA 108 (90 BASE) MCG/ACT IN AERS: 1.0000 | INHALATION_SPRAY | Freq: Four times a day (QID) | RESPIRATORY_TRACT | Status: DC | PRN

## 2014-09-03 MED ORDER — LEVOTHYROXINE SODIUM 88 MCG PO TABS
88.0000 ug | ORAL_TABLET | Freq: Every day | ORAL | Status: DC
  Administered 2014-09-04: 88 ug via ORAL
  Filled 2014-09-03: qty 1

## 2014-09-03 MED ORDER — CARVEDILOL 25 MG PO TABS
25.0000 mg | ORAL_TABLET | Freq: Two times a day (BID) | ORAL | Status: DC
  Filled 2014-09-03: qty 1

## 2014-09-03 MED ORDER — HYDRALAZINE HCL 50 MG PO TABS
50.0000 mg | ORAL_TABLET | Freq: Three times a day (TID) | ORAL | Status: DC
  Administered 2014-09-03 – 2014-09-04 (×3): 50 mg via ORAL
  Filled 2014-09-03 (×4): qty 1

## 2014-09-03 MED ORDER — AMLODIPINE BESYLATE 10 MG PO TABS
10.0000 mg | ORAL_TABLET | Freq: Every day | ORAL | Status: DC
  Administered 2014-09-03: 10 mg via ORAL
  Filled 2014-09-03: qty 1

## 2014-09-03 MED ORDER — PRAVASTATIN SODIUM 40 MG PO TABS
40.0000 mg | ORAL_TABLET | Freq: Every day | ORAL | Status: DC
  Administered 2014-09-03 – 2014-09-04 (×2): 40 mg via ORAL
  Filled 2014-09-03 (×2): qty 1

## 2014-09-03 MED ORDER — SEVELAMER CARBONATE 800 MG PO TABS
1600.0000 mg | ORAL_TABLET | Freq: Three times a day (TID) | ORAL | Status: DC
  Administered 2014-09-03: 3200 mg via ORAL
  Filled 2014-09-03 (×2): qty 4

## 2014-09-03 MED ORDER — DEXTROSE 50 % IV SOLN
1.0000 | Freq: Once | INTRAVENOUS | Status: AC
  Administered 2014-09-03: 50 mL via INTRAVENOUS
  Filled 2014-09-03: qty 50

## 2014-09-03 MED ORDER — SODIUM CHLORIDE 0.9 % IJ SOLN
3.0000 mL | Freq: Two times a day (BID) | INTRAMUSCULAR | Status: DC
  Administered 2014-09-03 – 2014-09-04 (×3): 3 mL via INTRAVENOUS

## 2014-09-03 MED ORDER — CINACALCET HCL 30 MG PO TABS: 90.0000 mg | ORAL_TABLET | Freq: Every day | ORAL | Status: DC

## 2014-09-03 MED ORDER — AMBRISENTAN 5 MG PO TABS: 5.0000 mg | ORAL_TABLET | Freq: Every day | ORAL | Status: DC

## 2014-09-03 MED ORDER — ACETAMINOPHEN 650 MG RE SUPP: 650.0000 mg | Freq: Four times a day (QID) | RECTAL | Status: DC | PRN

## 2014-09-03 MED ORDER — CINACALCET HCL 30 MG PO TABS
90.0000 mg | ORAL_TABLET | Freq: Every day | ORAL | Status: DC
  Administered 2014-09-03: 90 mg via ORAL
  Filled 2014-09-03: qty 3

## 2014-09-03 MED ORDER — INSULIN ASPART 100 UNIT/ML IV SOLN
10.0000 [IU] | Freq: Once | INTRAVENOUS | Status: AC
  Administered 2014-09-03: 10 [IU] via INTRAVENOUS
  Filled 2014-09-03: qty 1

## 2014-09-03 MED ORDER — ASPIRIN EC 81 MG PO TBEC
81.0000 mg | DELAYED_RELEASE_TABLET | Freq: Every day | ORAL | Status: DC
  Administered 2014-09-03 – 2014-09-04 (×2): 81 mg via ORAL
  Filled 2014-09-03 (×2): qty 1

## 2014-09-03 MED ORDER — LEVETIRACETAM 500 MG PO TABS
500.0000 mg | ORAL_TABLET | Freq: Every day | ORAL | Status: DC
  Administered 2014-09-03: 500 mg via ORAL
  Filled 2014-09-03: qty 1

## 2014-09-03 MED ORDER — NITROGLYCERIN 0.4 MG SL SUBL: 0.4000 mg | SUBLINGUAL_TABLET | SUBLINGUAL | Status: DC | PRN

## 2014-09-03 MED ORDER — ONDANSETRON HCL 4 MG PO TABS: 4.0000 mg | ORAL_TABLET | Freq: Four times a day (QID) | ORAL | Status: DC | PRN

## 2014-09-03 MED ORDER — ACETAMINOPHEN 325 MG PO TABS: 650.0000 mg | ORAL_TABLET | Freq: Four times a day (QID) | ORAL | Status: DC | PRN

## 2014-09-03 MED ORDER — ALBUTEROL SULFATE (2.5 MG/3ML) 0.083% IN NEBU
10.0000 mg | INHALATION_SOLUTION | Freq: Once | RESPIRATORY_TRACT | Status: AC
  Administered 2014-09-03: 10 mg via RESPIRATORY_TRACT
  Filled 2014-09-03: qty 12

## 2014-09-03 MED ORDER — TRAZODONE HCL 50 MG PO TABS: 50.0000 mg | ORAL_TABLET | Freq: Every evening | ORAL | Status: DC | PRN

## 2014-09-03 MED ORDER — PANTOPRAZOLE SODIUM 20 MG PO TBEC
20.0000 mg | DELAYED_RELEASE_TABLET | Freq: Every day | ORAL | Status: DC
  Administered 2014-09-03 – 2014-09-04 (×2): 20 mg via ORAL
  Filled 2014-09-03 (×2): qty 1

## 2014-09-03 NOTE — H&P (Signed)
[facility] History and Physical  SOUL DEVENEY OVZ:[id number] DOB: 01/02/1946 DOA: 09/03/2014  Referring physician: Dr. Sharol Given. PCP: [name], MD  Specialists: Dr. Lake Bells. Pulmonologist.  Chief Complaint: Nausea vomiting and diarrhea.  HPI: [name] is a 69 y.o. female with known history of hemodialysis on 1 day Wednesday and Friday presents to the ER because of nausea vomiting and diarrhea over the last 2-3 days. Patient denies any blood in the vomitus or diarrhea. Denies any fever or chills. Has some nonspecific abdominal discomfort. Patient denies using any recent antibiotics or having any sick contacts or any recent hospitalization. Patient states she has had multiple episodes of vomiting and diarrhea to the point that she was unable to go for dialysis yesterday. In the ER patient was found to have markedly elevated potassium with a hyperacute T waves in the EKG. On-call nephrologist has been consulted and patient has been admitted for further management. Patient denies any chest pain or shortness of breath. Abdomen appears benign.  Review of Systems: As presented in the history of presenting illness, rest negative.  Past Medical History  Diagnosis Date  . CAD (coronary artery disease)     stent to RCA  . CVA (cerebral infarction) 2003    no apparent residual  . Hypothyroidism   . PVD (peripheral vascular disease)   . Hyperlipidemia   . Positive PPD     completed rifampin  . Diastolic congestive heart failure   . Hypertension   . COPD (chronic obstructive pulmonary disease) t  . Cancer     clear cell cancer, kidney  . Complication of anesthesia 12/2010    pt is very confused, with AMS with anesthesia  . CVA 07/27/2008    CVA affected cognition and memory per family, no focal deficits.    . ESRD 07/27/2008    ESRD due to HTN and NSAID's, started hemodialysis in 2005 in [place], [place]. Went to [facility] from 2010 to 2012 and since 2012 has been  getting dialysis at [facility] on [address] in [place] on a MWF schedule. First access with RUA AVG placed in [place]. Next and current access was LUA AVG placed by Dr. Lucky Cowboy in [place] in or around 2012. Has had 2 or 3 procedures on that graft since placed per family. She gets her access work done here in [place] now. She is allergic to heparin and does not get any heparin at dialysis; she had an allergic reaction apparently when in ICU in the past.      . Encephalopathy   . Seizures   . Dyslipidemia   . Chronic renal insufficiency     On hemodialysis  . Arthritis of shoulder     Bilateral  . Depression    Past Surgical History  Procedure Laterality Date  . Abdominal hysterectomy    . Cholecystectomy    . D&cs    . Right ankle repair    . Left nephrectomy    . Vascular surgery  11/2010    graft inserted to left arm  . Nephrectomy Left     Malignant tumor  . Cataract extraction Bilateral   . Tonsillectomy    . Cardiac catheterization    . Left heart catheterization with coronary angiogram N/A 02/22/2011    Procedure: LEFT HEART CATHETERIZATION WITH CORONARY ANGIOGRAM;  Surgeon: Wellington Hampshire, MD;  Location: [facility] CATH LAB;  Service: Cardiovascular;  Laterality: N/A;  . Right heart catheterization N/A 01/29/2014    Procedure: RIGHT HEART  CATH;  Surgeon: Larey Dresser, MD;  Location: [facility] CATH LAB;  Service: Cardiovascular;  Laterality: N/A;   Social History:  reports that she has been smoking Cigarettes.  She has a 53 pack-year smoking history. She has never used smokeless tobacco. She reports that she drinks alcohol. She reports that she does not use illicit drugs. Where does patient live home. Can patient participate in ADLs? Yes.  Allergies  Allergen Reactions  . Heparin Other (See Comments)    MDs told her not to take after reaction in ICU  . Iohexol Swelling and Other (See Comments)    1970s; passed out and had facial/tongue swelling.  Requires 13-hour prep with  prednisone and benadryl  . Phenytoin Other (See Comments)    Had reaction while in ICU; doesn't know.  MDs told her not to take ever again.  . Wellbutrin [Bupropion] Other (See Comments)    seizures  . Ampicillin Hives and Rash  . Meperidine Hcl Swelling and Rash    Makes tongue swell  . Morphine Rash  . Penicillins Rash  . Valproic Acid And Related Other (See Comments)    Confusion   . Ace Inhibitors Rash  . Pentazocine Lactate Other (See Comments)    Patient does not remember reaction to this med (Talwin).     Family History:  Family History  Problem Relation Age of Onset  . Heart disease Father   . Hypertension Mother   . Dementia Mother   . Coronary artery disease Sister   . Heart attack Sister   . Hypertension Brother       Prior to Admission medications   Medication Sig Start Date End Date Taking? Authorizing Provider  acetaminophen (TYLENOL) 500 MG tablet Take 500 mg by mouth every 6 (six) hours as needed for pain.   Yes Historical Provider, MD  albuterol (PROVENTIL HFA;VENTOLIN HFA) 108 (90 BASE) MCG/ACT inhaler Inhale 1-2 puffs into the lungs every 6 (six) hours as needed for wheezing or shortness of breath. 10/06/13  Yes Linton Flemings, MD  ambrisentan (LETAIRIS) 5 MG tablet Take 1 tablet (5 mg total) by mouth daily. 06/04/14  Yes Juanito Doom, MD  amLODipine (NORVASC) 10 MG tablet Take 10 mg by mouth at bedtime.    Yes Historical Provider, MD  aspirin EC 81 MG tablet Take 81 mg by mouth daily.   Yes Historical Provider, MD  carvedilol (COREG) 25 MG tablet Take 25 mg by mouth 2 (two) times daily with a meal.   Yes Historical Provider, MD  hydrALAZINE (APRESOLINE) 50 MG tablet Take 50 mg by mouth 3 (three) times daily.   Yes Historical Provider, MD  hydrOXYzine (ATARAX/VISTARIL) 25 MG tablet Take 1 tablet by mouth daily as needed. On dialysis days 03/18/14  Yes Historical Provider, MD  isosorbide mononitrate (IMDUR) 30 MG 24 hr tablet TAKE 1 TABLET (30 MG TOTAL) BY MOUTH  DAILY. 06/18/14  Yes Minus Breeding, MD  levETIRAcetam (KEPPRA) 500 MG tablet Take 500 mg by mouth daily at 8 pm.   Yes Historical Provider, MD  levothyroxine (SYNTHROID, LEVOTHROID) 88 MCG tablet Take 88 mcg by mouth daily before breakfast.   Yes Historical Provider, MD  multivitamin (RENA-VIT) TABS tablet Take 1 tablet by mouth daily.   Yes Historical Provider, MD  NITROSTAT 0.4 MG SL tablet Place 0.4 mg under the tongue every 5 (five) minutes as needed for chest pain.  09/16/10  Yes Historical Provider, MD  omeprazole (PRILOSEC OTC) 20 MG tablet Take 20 mg  by mouth daily.   Yes Historical Provider, MD  pravastatin (PRAVACHOL) 40 MG tablet Take 40 mg by mouth daily.   Yes Historical Provider, MD  SENSIPAR 90 MG tablet Take 1 tablet by mouth daily. 03/05/14  Yes Historical Provider, MD  sevelamer carbonate (RENVELA) 800 MG tablet Take 1,600-3,200 mg by mouth 3 (three) times daily. 4 tabs TID, 2 tabs with each snack   Yes Historical Provider, MD  traMADol (ULTRAM) 50 MG tablet Take 1 tablet by mouth daily as needed. 04/05/14  Yes Historical Provider, MD  traZODone (DESYREL) 50 MG tablet Take 50 mg by mouth at bedtime as needed for sleep.    Yes Historical Provider, MD  ondansetron (ZOFRAN ODT) 8 MG disintegrating tablet Take 1 tablet (8 mg total) by mouth every 8 (eight) hours as needed for nausea or vomiting. Patient not taking: Reported on 09/03/2014 10/06/13   Linton Flemings, MD    Physical Exam: Filed Vitals:   09/03/14 0200 09/03/14 0215 09/03/14 0322 09/03/14 0330  BP:   185/65 186/76  Pulse: 73 73 78 85  Temp:      TempSrc:      Resp: 18 13 23 19   Height:      Weight:      SpO2: 100% 100% 87% 84%     General:  Moderately built and nourished.  Eyes: anicteric no pallor.  ENT: no discharge from the ears eyes nose and mouth.  Neck: no mass felt. JVD elevated.  Cardiovascular: S1-S2 heard.  Respiratory: no rhonchi or crepitations.  Abdomen: soft nontender bowel sounds present.  Skin:  no rash.  Musculoskeletal: no edema.  Psychiatric: appears normal.  Neurologic: alert awake oriented to time place and person. Moves all extremities.  Labs on Admission:  Basic Metabolic Panel:  Recent Labs Lab 09/03/14 0031 09/03/14 0309  NA 135 134*  K 6.8* 5.3*  CL 96* 98*  CO2 27 25  GLUCOSE 106* 55*  BUN 49* 51*  CREATININE 12.03* 11.51*  CALCIUM 9.0 14.4*  PHOS  --  4.1   Liver Function Tests:  Recent Labs Lab 09/03/14 0031 09/03/14 0309  AST 15  --   ALT 12*  --   ALKPHOS 97  --   BILITOT 0.7  --   PROT 6.2*  --   ALBUMIN 3.2* 3.1*    Recent Labs Lab 09/03/14 0031  LIPASE 27   No results for input(s): AMMONIA in the last 168 hours. CBC:  Recent Labs Lab 09/03/14 0031 09/03/14 0309  WBC 6.8 6.0  HGB 9.9* 10.2*  HCT 30.6* 31.2*  MCV 83.4 83.9  PLT 159 132*   Cardiac Enzymes: No results for input(s): CKTOTAL, CKMB, CKMBINDEX, TROPONINI in the last 168 hours.  BNP (last 3 results) No results for input(s): BNP in the last 8760 hours.  ProBNP (last 3 results) No results for input(s): PROBNP in the last 8760 hours.  CBG: No results for input(s): GLUCAP in the last 168 hours.  Radiological Exams on Admission: No results found.  EKG: Independently reviewed. Normal sinus rhythm with hyperacute T waves.  Assessment/Plan Principal Problem:   Nausea vomiting and diarrhea Active Problems:   Chronic diastolic heart failure   HTN (hypertension)   Epilepsy   Pulmonary hypertension   Hyperkalemia   Diarrhea   1. Nausea vomiting and diarrhea - most likely viral gastroenteritis. However check stool studies to rule out other causes. Closely monitor. 2. Hypokalemia secondary to missed dialysis and also noncompliant with diet - nephrology has  been consulted for urgent dialysis. Further recommendations per nephrologist. 3. Pulmonary hypertension - continue present medications. 4. CAD - denies any chest pain. 5. History of seizures - if patient  cannot take oral Keppra than may have to change to IV Keppra. 6. Chronic anemia - follow CBC. 7. History of chronic combined systolic and diastolic CHF with last EF measured being 35-40%. Fluid management per nephrologist.  I have reviewed patient's old chart and labs and personally reviewed x-ray and EKG.   DVT ProphylaxisSCDs. Patient is allergic to heparin.  Code Status: full code.  Family Communication: patient's family at the bedside.  Disposition Plan: admit inpatient.    Torrion Witter N. [facility] Pager [phone].  If 7PM-7AM, please contact night-coverage [email or web address] Password [facility] 09/03/2014, 3:46 AM

## 2014-09-03 NOTE — ED Notes (Signed)
Patient transported to X-ray with Liliane Channel, Transporter

## 2014-09-03 NOTE — ED Notes (Signed)
Patient presents with c/o N/V/D since Tuesday night.  Missed her diaylsis Wed due to feeling sick.

## 2014-09-03 NOTE — Procedures (Signed)
I have seen and examined this patient and agree with the plan of care   BP 120/80  Pulse 80   Lungs clear no edema   AVF left thrill and bruit    [name] W 09/03/2014, 9:30 AM

## 2014-09-03 NOTE — ED Provider Notes (Signed)
CSN: [id number]     Arrival date & time 09/02/14  2345 History  This chart was scribed for Linton Flemings, MD by Hansel Feinstein, ED Scribe. This patient was seen in room B14C/B14C and the patient's care was started at 1:38 AM.     Chief Complaint  Patient presents with  . Emesis  . Diarrhea   The history is provided by the patient. No language interpreter was used.    HPI Comments: KAMILLAH DIDONATO is a 69 y.o. female with Hx of chronic renal insufficiency, ESRD, encephalopathy, dyslipidemia, CVA, cancer, COPD, HTN, hypothyroidism, CAD, PVD, HLD who presents to the Emergency Department complaining of moderate, intermittent vomiting onset 2 days ago with associated nausea, diarrhea, decreased appetite, abdominal cramping. Pt states she missed her dialysis appointment yesterday due to not feeling well. Her nephrologist is Dr. Baird Cancer in [place]. She denies CP, SOB.   Past Medical History  Diagnosis Date  . CAD (coronary artery disease)     stent to RCA  . CVA (cerebral infarction) 2003    no apparent residual  . Hypothyroidism   . PVD (peripheral vascular disease)   . Hyperlipidemia   . Positive PPD     completed rifampin  . Diastolic congestive heart failure   . Hypertension   . COPD (chronic obstructive pulmonary disease) t  . Cancer     clear cell cancer, kidney  . Complication of anesthesia 12/2010    pt is very confused, with AMS with anesthesia  . CVA 07/27/2008    CVA affected cognition and memory per family, no focal deficits.    . ESRD 07/27/2008    ESRD due to HTN and NSAID's, started hemodialysis in 2005 in [place], [place]. Went to [facility] from 2010 to 2012 and since 2012 has been getting dialysis at [facility] on [address] in [place] on a MWF schedule. First access with RUA AVG placed in [place]. Next and current access was LUA AVG placed by Dr. Lucky Cowboy in [place] in or around 2012. Has had 2 or 3 procedures on that graft since placed per family. She gets her access  work done here in [place] now. She is allergic to heparin and does not get any heparin at dialysis; she had an allergic reaction apparently when in ICU in the past.      . Encephalopathy   . Seizures   . Dyslipidemia   . Chronic renal insufficiency     On hemodialysis  . Arthritis of shoulder     Bilateral  . Depression    Past Surgical History  Procedure Laterality Date  . Abdominal hysterectomy    . Cholecystectomy    . D&cs    . Right ankle repair    . Left nephrectomy    . Vascular surgery  11/2010    graft inserted to left arm  . Nephrectomy Left     Malignant tumor  . Cataract extraction Bilateral   . Tonsillectomy    . Cardiac catheterization    . Left heart catheterization with coronary angiogram N/A 02/22/2011    Procedure: LEFT HEART CATHETERIZATION WITH CORONARY ANGIOGRAM;  Surgeon: Wellington Hampshire, MD;  Location: [facility] CATH LAB;  Service: Cardiovascular;  Laterality: N/A;  . Right heart catheterization N/A 01/29/2014    Procedure: RIGHT HEART CATH;  Surgeon: Larey Dresser, MD;  Location: [facility] CATH LAB;  Service: Cardiovascular;  Laterality: N/A;   Family History  Problem Relation Age of Onset  . Heart disease Father   .  Hypertension Mother   . Dementia Mother   . Coronary artery disease Sister   . Heart attack Sister   . Hypertension Brother    Social History  Substance Use Topics  . Smoking status: Current Every Day Smoker -- 1.00 packs/day for 53 years    Types: Cigarettes  . Smokeless tobacco: Never Used     Comment: smoking .25ppd 05/26/14.  [name] Alcohol Use: 0.0 oz/week    0 Standard drinks or equivalent per week     Comment: very rarely per pt   OB History    No data available     Review of Systems  Constitutional: Positive for appetite change.  Respiratory: Negative for shortness of breath.   Cardiovascular: Negative for chest pain.  Gastrointestinal: Positive for nausea, vomiting and diarrhea.       Abdominal cramping  All other systems  reviewed and are negative.  Allergies  Heparin; Iohexol; Phenytoin; Wellbutrin; Ampicillin; Meperidine hcl; Morphine; Penicillins; Valproic acid and related; Ace inhibitors; and Pentazocine lactate  Home Medications   Prior to Admission medications   Medication Sig Start Date End Date Taking? Authorizing Provider  acetaminophen (TYLENOL) 500 MG tablet Take 500 mg by mouth every 6 (six) hours as needed for pain.    Historical Provider, MD  acyclovir (ZOVIRAX) 200 MG capsule Take 200 mg by mouth daily as needed (shingles).    Historical Provider, MD  albuterol (PROVENTIL HFA;VENTOLIN HFA) 108 (90 BASE) MCG/ACT inhaler Inhale 1-2 puffs into the lungs every 6 (six) hours as needed for wheezing or shortness of breath. 10/06/13   Linton Flemings, MD  ambrisentan (LETAIRIS) 5 MG tablet Take 1 tablet (5 mg total) by mouth daily. 06/04/14   Juanito Doom, MD  amLODipine (NORVASC) 10 MG tablet Take 10 mg by mouth at bedtime.     Historical Provider, MD  aspirin EC 81 MG tablet Take 81 mg by mouth daily.    Historical Provider, MD  carvedilol (COREG) 25 MG tablet Take 25 mg by mouth 2 (two) times daily with a meal.    Historical Provider, MD  hydrALAZINE (APRESOLINE) 50 MG tablet Take 50 mg by mouth 3 (three) times daily.    Historical Provider, MD  hydrOXYzine (ATARAX/VISTARIL) 25 MG tablet Take 1 tablet by mouth daily as needed. On dialysis days 03/18/14   Historical Provider, MD  isosorbide mononitrate (IMDUR) 30 MG 24 hr tablet TAKE 1 TABLET (30 MG TOTAL) BY MOUTH DAILY. 06/18/14   Minus Breeding, MD  levETIRAcetam (KEPPRA) 500 MG tablet Take 500 mg by mouth daily at 8 pm.    Historical Provider, MD  levothyroxine (SYNTHROID, LEVOTHROID) 88 MCG tablet Take 88 mcg by mouth daily before breakfast.    Historical Provider, MD  loperamide (IMODIUM A-D) 2 MG tablet Take 2 mg by mouth 4 (four) times daily as needed for diarrhea or loose stools.    Historical Provider, MD  loratadine (CLARITIN) 10 MG tablet Take 10  mg by mouth daily as needed for allergies.    Historical Provider, MD  multivitamin (RENA-VIT) TABS tablet Take 1 tablet by mouth daily.    Historical Provider, MD  NITROSTAT 0.4 MG SL tablet Place 0.4 mg under the tongue every 5 (five) minutes as needed for chest pain.  09/16/10   Historical Provider, MD  omeprazole (PRILOSEC OTC) 20 MG tablet Take 20 mg by mouth daily.    Historical Provider, MD  ondansetron (ZOFRAN ODT) 8 MG disintegrating tablet Take 1 tablet (8 mg total) by  mouth every 8 (eight) hours as needed for nausea or vomiting. 10/06/13   Linton Flemings, MD  pravastatin (PRAVACHOL) 40 MG tablet Take 40 mg by mouth daily.    Historical Provider, MD  SENSIPAR 90 MG tablet Take 1 tablet by mouth daily. 03/05/14   Historical Provider, MD  sevelamer carbonate (RENVELA) 800 MG tablet Take 1,600-3,200 mg by mouth 3 (three) times daily. 4 tabs TID, 2 tabs with each snack    Historical Provider, MD  traMADol (ULTRAM) 50 MG tablet Take 1 tablet by mouth daily as needed. 04/05/14   Historical Provider, MD  traZODone (DESYREL) 50 MG tablet Take 50 mg by mouth at bedtime as needed for sleep.     Historical Provider, MD   BP 192/63 mmHg  Pulse 80  Temp(Src) 99.5 F (37.5 C) (Oral)  Resp 22  Ht 5\' 3"  (1.6 m)  Wt 172 lb 8 oz (78.245 kg)  BMI 30.56 kg/m2  SpO2 92% Physical Exam  Constitutional: She is oriented to person, place, and time. She appears well-developed and well-nourished.  HENT:  Head: Normocephalic and atraumatic.  Nose: Nose normal.  Mouth/Throat: Oropharynx is clear and moist.  Eyes: Conjunctivae and EOM are normal. Pupils are equal, round, and reactive to light.  Neck: Normal range of motion. Neck supple. No JVD present. No tracheal deviation present. No thyromegaly present.  Cardiovascular: Normal rate, regular rhythm, normal heart sounds and intact distal pulses.  Exam reveals no gallop and no friction rub.   No murmur heard. Pulmonary/Chest: Effort normal and breath sounds normal.  No stridor. No respiratory distress. She has no wheezes. She has no rales. She exhibits no tenderness.  Abdominal: Soft. She exhibits no distension and no mass. There is no tenderness. There is no rebound and no guarding.  Hyperactive bowel sounds  Musculoskeletal: Normal range of motion. She exhibits no edema or tenderness.  Lymphadenopathy:    She has no cervical adenopathy.  Neurological: She is alert and oriented to person, place, and time. She displays normal reflexes. She exhibits normal muscle tone. Coordination normal.  Skin: Skin is warm and dry. No rash noted. No erythema. No pallor.  Psychiatric: She has a normal mood and affect. Her behavior is normal. Judgment and thought content normal.  Nursing note and vitals reviewed.   ED Course  Procedures (including critical care time) DIAGNOSTIC STUDIES: Oxygen Saturation is 92% on RA, low by my interpretation.    COORDINATION OF CARE: 1:41 AM Discussed treatment plan with pt at bedside and pt agreed to plan.   Labs Review Labs Reviewed  COMPREHENSIVE METABOLIC PANEL - Abnormal; Notable for the following:    Potassium 6.8 (*)    Chloride 96 (*)    Glucose, Bld 106 (*)    BUN 49 (*)    Creatinine, Ser 12.03 (*)    Total Protein 6.2 (*)    Albumin 3.2 (*)    ALT 12 (*)    GFR calc non Af Amer 3 (*)    GFR calc Af Amer 3 (*)    All other components within normal limits  CBC - Abnormal; Notable for the following:    RBC 3.67 (*)    Hemoglobin 9.9 (*)    HCT 30.6 (*)    All other components within normal limits  URINALYSIS, ROUTINE W REFLEX MICROSCOPIC (NOT AT [facility]) - Abnormal; Notable for the following:    APPearance CLOUDY (*)    Glucose, UA 250 (*)    Hgb urine dipstick TRACE (*)  Protein, ur 100 (*)    All other components within normal limits  URINE MICROSCOPIC-ADD ON - Abnormal; Notable for the following:    Squamous Epithelial / LPF MANY (*)    Bacteria, UA MANY (*)    All other components within normal limits   RENAL FUNCTION PANEL - Abnormal; Notable for the following:    Sodium 134 (*)    Potassium 5.3 (*)    Chloride 98 (*)    Glucose, Bld 55 (*)    BUN 51 (*)    Creatinine, Ser 11.51 (*)    Calcium 14.4 (*)    Albumin 3.1 (*)    GFR calc non Af Amer 3 (*)    GFR calc Af Amer 3 (*)    All other components within normal limits  CBC - Abnormal; Notable for the following:    RBC 3.72 (*)    Hemoglobin 10.2 (*)    HCT 31.2 (*)    Platelets 132 (*)    All other components within normal limits  LIPASE, BLOOD  CBC  RENAL FUNCTION PANEL    Imaging Review Dg Abd Acute W/chest  09/03/2014   CLINICAL DATA:  Mid abdominal pain, nausea and vomiting.  EXAM: DG ABDOMEN ACUTE W/ 1V CHEST  COMPARISON:  CT 03/19/2014  FINDINGS: The abdominal gas pattern is negative for obstruction or perforation. There are extensive vascular calcifications in the abdomen. There are new ground-glass opacities in the central and basilar lungs which could represent alveolar edema or infectious infiltrate. No large effusion is evident.  IMPRESSION: Negative for obstruction or perforation. Ground-glass opacities in both lungs could represent alveolar edema or infectious infiltrate.   Electronically Signed   By: Andreas Newport M.D.   On: 09/03/2014 04:48   I have personally reviewed and evaluated these images and lab results as part of my medical decision-making.   EKG Interpretation   Date/Time:  Thursday September 03 2014 01:31:01 EDT Ventricular Rate:  73 PR Interval:  149 QRS Duration: 97 QT Interval:  434 QTC Calculation: 478 R Axis:   -24 Text Interpretation:  Sinus arrhythmia Low voltage, extremity and  precordial leads Consider anterior infarct Baseline wander in lead(s) V2  hyperacute T waves  Confirmed by Leamon Palau  MD, [place] Cloe ([id number]) on 09/03/2014  1:50:12 AM     CRITICAL CARE Performed by: Kalman Drape Total critical care time: 60 min Critical care time was exclusive of separately billable procedures and  treating other patients. Critical care was necessary to treat or prevent imminent or life-threatening deterioration. Critical care was time spent personally by me on the following activities: development of treatment plan with patient and/or surrogate as well as nursing, discussions with consultants, evaluation of patient's response to treatment, examination of patient, obtaining history from patient or surrogate, ordering and performing treatments and interventions, ordering and review of laboratory studies, ordering and review of radiographic studies, pulse oximetry and re-evaluation of patient's condition. MDM   Final diagnoses:  Hyperkalemia  ESRD (end stage renal disease)  Nausea vomiting and diarrhea   69 yo female with n/v/d starting Tuesday evening, missed dialysis Wednesday.  Elevated K with hyperacute T waves compared to prior.  Will start on hyperkalemia protocol and discuss with renal.   I personally performed the services described in this documentation, which was scribed in my presence. The recorded information has been reviewed and is accurate.  1:51 AM Case d/w Augustin Coupe who will get patient up for dialysis. D/w Dr Renetta Chalk who will admit  Linton Flemings, MD 09/03/14 0700

## 2014-09-03 NOTE — ED Notes (Addendum)
Dr. Sharol Given made aware of the critical Potassium. Received verbal orders for an EKG. Advised pt is also a dialysis patient and missed her dialysis today d/t not feeling well.

## 2014-09-03 NOTE — Progress Notes (Signed)
Dr. Carles Collet on unit and made aware of patients arrival. MD to see patient.

## 2014-09-03 NOTE — Consult Note (Signed)
Reason for Consult: Hyperkalemia Referring Physician:  Dr. Sharol Given  Chief Complaint: Nausea, vomiting and diarrhea  Assessment/Plan: 1 ESRD - MWF at [facility] with Dr. Joelyn Oms; missed Wednesday treatment because of the persistent nausea, vomiting and diarrhea. She usually has a IDWG of 3kg but the last treatment on Monday she was 5 kg up. 2 Hyperkalemia - We will get her on dialysis ~430am; currently with peaked t-waves but no QRS widening. CaGluconate per protocol. Likely from eating 4 plums which were about to go bad + missed dialysis treatment. 3 Hypertension: Partially secondary to being volume overloaded --> she is almost 4kg up on her EDW. Restart home anti-hypertensives and dialysis this AM. 4. Anemia of ESRD: Stable. 5. Metabolic Bone Disease: On Calcitriol 45mg PO TIW. 6. Diarrhea - Will need some workup; this was the reason she missed dialysis to begin with. 7. COPD - continues to be a smoker. 8. PVD 9. CAD - compensated 10. CVA   [facility] Primary Nephrologist: Dr. SJoelyn OmsEDW 74.5kg Heparin None (allergic) Access LUA arc AVG (NOT A HeRO)  HPI: Sarah LASECKIis a 69y.o. female who dialyzes MWF at [facility] with Dr. SJoelyn Omswho missed dialysis on Wednesday because of persistent nausea, vomiting and diarrhea. She usually has a IDWG of 3kg but the last treatment on Monday she was 5 kg up. She has a reported allergy to heparin and is currently dialyzing through a left upper arm arc graft. She states that it is a HeRO but on angiography and physical exam this is certainly a left arc AVG. She has had nausea, vomiting and diarrhea for the past 2 days with no other close contact that is sick. She denies any fever, chills, dizziness or loss of consciousness. She is not able to characterize the nature or the diarrhea or if there is hematochezia. No recent travel history and she has not eaten out any time recently. She has a chronic cough from her COPD which is not worse than  baseline.   Past Medical History  Diagnosis Date  . CAD (coronary artery disease)     stent to RCA  . CVA (cerebral infarction) 2003    no apparent residual  . Hypothyroidism   . PVD (peripheral vascular disease)   . Hyperlipidemia   . Positive PPD     completed rifampin  . Diastolic congestive heart failure   . Hypertension   . COPD (chronic obstructive pulmonary disease) t  . Cancer     clear cell cancer, kidney  . Complication of anesthesia 12/2010    pt is very confused, with AMS with anesthesia  . CVA 07/27/2008    CVA affected cognition and memory per family, no focal deficits.    . ESRD 07/27/2008    ESRD due to HTN and NSAID's, [place] [facility] [place] [facility] [place]. [place] and [place] access [place]. Has had 2 or 3 procedures on that graft since placed per family. [place] She is allergic to heparin and does not get any heparin at dialysis; she had an allergic reaction apparently when in ICU in the past.      . Encephalopathy   .  Seizures   . Dyslipidemia   . Chronic renal insufficiency     On hemodialysis  . Arthritis of shoulder     Bilateral  . Depression      Allergies:  Allergies  Allergen Reactions  . Heparin Other (See Comments)    MDs told her not to take after reaction in ICU  . Iohexol Swelling and Other (See Comments)    1970s; passed out and had facial/tongue swelling.  Requires 13-hour prep with prednisone and benadryl  . Phenytoin Other (See Comments)    Had reaction while in ICU; doesn't know.  MDs told her not to take ever again.  . Wellbutrin [Bupropion] Other (See Comments)    seizures  . Ampicillin Hives and Rash  . Meperidine Hcl Swelling and Rash     Makes tongue swell  . Morphine Rash  . Penicillins Rash  . Valproic Acid And Related Other (See Comments)    Confusion   . Ace Inhibitors Rash  . Pentazocine Lactate Other (See Comments)    Patient does not remember reaction to this med ([place]).     Past Surgical History  Procedure Laterality Date  . Abdominal hysterectomy    . Cholecystectomy    . D&cs    . Right ankle repair    . Left nephrectomy    . Vascular surgery  11/2010    graft inserted to left arm  . Nephrectomy Left     Malignant tumor  . Cataract extraction Bilateral   . Tonsillectomy    . Cardiac catheterization    . Left heart catheterization with coronary angiogram N/A 02/22/2011    Procedure: LEFT HEART CATHETERIZATION WITH CORONARY ANGIOGRAM;  Surgeon: Wellington Hampshire, MD;  Location: [facility] CATH LAB;  Service: Cardiovascular;  Laterality: N/A;  . Right heart catheterization N/A 01/29/2014    Procedure: RIGHT HEART CATH;  Surgeon: Larey Dresser, MD;  Location: [facility] CATH LAB;  Service: Cardiovascular;  Laterality: N/A;    Family History  Problem Relation Age of Onset  . Heart disease Father   . Hypertension Mother   . Dementia Mother   . Coronary artery disease Sister   . Heart attack Sister   . Hypertension Brother     Social History:  reports that she has been smoking Cigarettes.  She has a 53 pack-year smoking history. She has never used smokeless tobacco. She reports that she drinks alcohol. She reports that she does not use illicit drugs.  Review of Systems  Constitutional: Negative for fever, chills, weight loss and malaise/fatigue.  HENT: Negative for hearing loss and nosebleeds.   Eyes: Negative for blurred vision and double vision.  Respiratory: Positive for cough and shortness of breath. Negative for hemoptysis and sputum production.   Cardiovascular: Negative for chest pain, palpitations and orthopnea.  Gastrointestinal: Positive for nausea, vomiting and diarrhea. Negative for heartburn.   Genitourinary: Negative for dysuria and urgency.  Musculoskeletal: Negative for myalgias, back pain and neck pain.  Skin: Negative for rash.  Neurological: Negative for dizziness, tingling, tremors, seizures, loss of consciousness and headaches.  Endo/Heme/Allergies: Does not bruise/bleed easily.  Psychiatric/Behavioral: Negative for depression.   Pertinent items are noted in HPI.  Blood pressure 192/63, pulse 73, temperature 99.5 F (37.5 C), temperature source Oral, resp. rate 13, height '5\' 3"'  (1.6 m), weight 78.245 kg (172 lb 8 oz), SpO2 100 %. General appearance: alert, cooperative and appears stated age Head: Normocephalic, without obvious abnormality, atraumatic Eyes: negative Neck: no adenopathy, no carotid  bruit, no JVD, supple, symmetrical, trachea midline and thyroid not enlarged, symmetric, no tenderness/mass/nodules Back: symmetric, no curvature. ROM normal. No CVA tenderness. Resp: clear to auscultation bilaterally Chest wall: no tenderness Cardio: regular rate and rhythm, S1, S2 normal, no murmur, click, rub or gallop GI: soft, non-tender; bowel sounds normal; no masses,  no organomegaly Extremities: extremities normal, atraumatic, no cyanosis or edema Pulses: 2+ and symmetric Skin: Skin color, texture, turgor normal. No rashes or lesions Lymph nodes: Cervical, supraclavicular, and axillary nodes normal. Neurologic: Grossly normal  Access: left upper arc AVG +thrill and bruit.  Results for orders placed or performed during the hospital encounter of 09/03/14 (from the past 48 hour(s))  Urinalysis, Routine w reflex microscopic (not at [facility])     Status: Abnormal   Collection Time: 09/03/14 12:18 AM  Result Value Ref Range   Color, Urine YELLOW YELLOW   APPearance CLOUDY (A) CLEAR   Specific Gravity, Urine 1.010 1.005 - 1.030   pH 5.5 5.0 - 8.0   Glucose, UA 250 (A) NEGATIVE mg/dL   Hgb urine dipstick TRACE (A) NEGATIVE   Bilirubin Urine NEGATIVE NEGATIVE   Ketones,  ur NEGATIVE NEGATIVE mg/dL   Protein, ur 100 (A) NEGATIVE mg/dL   Urobilinogen, UA 0.2 0.0 - 1.0 mg/dL   Nitrite NEGATIVE NEGATIVE   Leukocytes, UA NEGATIVE NEGATIVE  Urine microscopic-add on     Status: Abnormal   Collection Time: 09/03/14 12:18 AM  Result Value Ref Range   Squamous Epithelial / LPF MANY (A) RARE   WBC, UA 0-2 <3 WBC/hpf   RBC / HPF 0-2 <3 RBC/hpf   Bacteria, UA MANY (A) RARE   Urine-Other MICROSCOPIC EXAM PERFORMED ON UNCONCENTRATED URINE     Comment: LESS THAN 10 mL OF URINE SUBMITTED RARE YEAST   Lipase, blood     Status: None   Collection Time: 09/03/14 12:31 AM  Result Value Ref Range   Lipase 27 22 - 51 U/L  Comprehensive metabolic panel     Status: Abnormal   Collection Time: 09/03/14 12:31 AM  Result Value Ref Range   Sodium 135 135 - 145 mmol/L   Potassium 6.8 (HH) 3.5 - 5.1 mmol/L    Comment: NO VISIBLE HEMOLYSIS CRITICAL RESULT CALLED TO, READ BACK BY AND VERIFIED WITH: [facility] H,RN 09/03/14 0113 WAYK    Chloride 96 (L) 101 - 111 mmol/L   CO2 27 22 - 32 mmol/L   Glucose, Bld 106 (H) 65 - 99 mg/dL   BUN 49 (H) 6 - 20 mg/dL   Creatinine, Ser 12.03 (H) 0.44 - 1.00 mg/dL   Calcium 9.0 8.9 - 10.3 mg/dL   Total Protein 6.2 (L) 6.5 - 8.1 g/dL   Albumin 3.2 (L) 3.5 - 5.0 g/dL   AST 15 15 - 41 U/L   ALT 12 (L) 14 - 54 U/L   Alkaline Phosphatase 97 38 - 126 U/L   Total Bilirubin 0.7 0.3 - 1.2 mg/dL   GFR calc non Af Amer 3 (L) >60 mL/min   GFR calc Af Amer 3 (L) >60 mL/min    Comment: (NOTE) The eGFR has been calculated using the CKD EPI equation. This calculation has not been validated in all clinical situations. eGFR's persistently <60 mL/min signify possible Chronic Kidney Disease.    Anion gap 12 5 - 15  CBC     Status: Abnormal   Collection Time: 09/03/14 12:31 AM  Result Value Ref Range   WBC 6.8 4.0 - 10.5 K/uL  RBC 3.67 (L) 3.87 - 5.11 MIL/uL   Hemoglobin 9.9 (L) 12.0 - 15.0 g/dL   HCT 30.6 (L) 36.0 - 46.0 %   MCV 83.4 78.0 - 100.0  fL   MCH 27.0 26.0 - 34.0 pg   MCHC 32.4 30.0 - 36.0 g/dL   RDW 14.4 11.5 - 15.5 %   Platelets 159 150 - 400 K/uL    No results found.   Dwana Melena, MD 09/03/2014, 2:51 AM

## 2014-09-03 NOTE — Progress Notes (Signed)
Patient has arrived to unit via stretcher from dialysis with Earnest Bailey, Therapist, sports. Patient alert and oriented x4 with no complaints at this time. Patient denies SHOB, pain, nausea and vomiting at this time. Patient has right arm graft with dressing that is dry and intact. Patient only request if for "something to drink." Patient place on cardiac monitor per orders, CCMD notified. Call bell and phone within reach. Will continue to monitor.

## 2014-09-03 NOTE — Progress Notes (Signed)
PROGRESS NOTE  [name] DTO:[id number] DOB: 08/16/1945 DOA: 09/03/2014 PCP: [name], MD  Brief History 69 year old female with a history of ESRD, COPD, hyponatremia, coronary artery disease status post RCA stent, hyperlipidemia presented with 2-3 days of nausea, vomiting, diarrhea or she denied any fevers, chills, sick contacts, recent hospitalizations, raw or undercooked foods. She has some nonspecific abdominal pain. In the emergency department, the patient was found to have hyperkalemia with potassium 6.8 and elevated T waves. The patient was emergently taken to dialysis. She missed a dialysis on 09/01/2014 due to her illness. Since admission, the patient has improved clinically. Her vomiting has improved. In addition, the patient has not had a bowel movement since admission. Assessment/Plan: Intractable vomiting and diarrhea -Likely viral gastroenteritis as the patient is already improving after 24 hours in the hospital -The diet was advanced which she is tolerating -GI pathogen panel will be collected if she has another loose bowel movement Hyperkalemia -improved with HD Pulmonary hypertension -Continue Letairis Hypertension--systemic -Continue amlodipine and carvedilol -continue hydralazine and imdur ESRD -appreciate nephrology -continue phosphate binders Seizure disorder -continue Keppra Hypothyroidism -continue synthroid History of chronic combined systolic and diastolic CHF  -98/33/8250 echocardiogram EF 35-40%, grade 4 DD -compensated    Family Communication:   Pt at beside Disposition Plan:   Home when medically stable        Procedures/Studies: Dg Abd Acute W/chest  09/03/2014   CLINICAL DATA:  Mid abdominal pain, nausea and vomiting.  EXAM: DG ABDOMEN ACUTE W/ 1V CHEST  COMPARISON:  CT 03/19/2014  FINDINGS: The abdominal gas pattern is negative for obstruction or perforation. There are extensive vascular calcifications in the abdomen.  There are new ground-glass opacities in the central and basilar lungs which could represent alveolar edema or infectious infiltrate. No large effusion is evident.  IMPRESSION: Negative for obstruction or perforation. Ground-glass opacities in both lungs could represent alveolar edema or infectious infiltrate.   Electronically Signed   By: Andreas Newport M.D.   On: 09/03/2014 04:48         Subjective: Patient states that her vomiting has improved as well as her diarrhea. She was able to tolerate a soft diet. Denies any fevers, chills, chest pain, shortness of breath, hematochezia, melena, hematemesis.  Objective: Filed Vitals:   09/03/14 0830 09/03/14 0855 09/03/14 0957 09/03/14 1428  BP: 141/75 130/100 129/50 120/76  Pulse: 77 86 77 81  Temp:  97.9 F (36.6 C) 98.7 F (37.1 C) 99.4 F (37.4 C)  TempSrc:  Oral Oral Oral  Resp:  15 18 18   Height:      Weight:  74.5 kg (164 lb 3.9 oz)    SpO2:  96% 94% 95%    Intake/Output Summary (Last 24 hours) at 09/03/14 1533 Last data filed at 09/03/14 1426  Gross per 24 hour  Intake    720 ml  Output   3503 ml  Net  -2783 ml   Weight change:  Exam:   General:  Pt is alert, follows commands appropriately, not in acute distress  HEENT: No icterus, No thrush, No neck mass, [place]/AT  Cardiovascular: RRR, S1/S2, no rubs, no gallops  Respiratory: CTA bilaterally, no wheezing, no crackles, no rhonchi  Abdomen: Soft/+BS, non tender, non distended, no guarding  Extremities: No edema, No lymphangitis, No petechiae, No rashes, no synovitis  Data Reviewed: Basic Metabolic Panel:  Recent Labs Lab 09/03/14 0031 09/03/14 0309 09/03/14 0640 09/03/14 1140  NA  135 134* 137 137  K 6.8* 5.3* 5.4* 4.4  CL 96* 98* 97* 97*  CO2 27 25 27 30   GLUCOSE 106* 55* 58* 99  BUN 49* 51* 53* 15  CREATININE 12.03* 11.51* 12.09* 5.66*  CALCIUM 9.0 14.4* 9.3 8.6*  PHOS  --  4.1 4.5  --    Liver Function Tests:  Recent Labs Lab 09/03/14 0031  09/03/14 0309 09/03/14 0640 09/03/14 1140  AST 15  --   --  20  ALT 12*  --   --  11*  ALKPHOS 97  --   --  98  BILITOT 0.7  --   --  0.9  PROT 6.2*  --   --  6.4*  ALBUMIN 3.2* 3.1* 3.0* 3.0*    Recent Labs Lab 09/03/14 0031  LIPASE 27   No results for input(s): AMMONIA in the last 168 hours. CBC:  Recent Labs Lab 09/03/14 0031 09/03/14 0309 09/03/14 0639 09/03/14 1140  WBC 6.8 6.0 6.7 5.0  NEUTROABS  --   --   --  3.3  HGB 9.9* 10.2* 9.4* 10.2*  HCT 30.6* 31.2* 28.7* 31.6*  MCV 83.4 83.9 82.9 83.6  PLT 159 132* 163 155   Cardiac Enzymes: No results for input(s): CKTOTAL, CKMB, CKMBINDEX, TROPONINI in the last 168 hours. BNP: Invalid input(s): POCBNP CBG: No results for input(s): GLUCAP in the last 168 hours.  No results found for this or any previous visit (from the past 240 hour(s)).   Scheduled Meds: . ambrisentan  5 mg Oral Daily  . amLODipine  10 mg Oral QHS  . aspirin EC  81 mg Oral Daily  . carvedilol  25 mg Oral BID WC  . [START ON 09/04/2014] cinacalcet  90 mg Oral Q supper  . hydrALAZINE  50 mg Oral TID  . isosorbide mononitrate  30 mg Oral Daily  . levETIRAcetam  500 mg Oral Q2000  . [START ON 09/04/2014] levothyroxine  88 mcg Oral QAC breakfast  . multivitamin  1 tablet Oral Daily  . pantoprazole  20 mg Oral Daily  . pravastatin  40 mg Oral Daily  . sevelamer carbonate  1,600-3,200 mg Oral TID WC  . sodium chloride  3 mL Intravenous Q12H   Continuous Infusions:    Abrahan Fulmore, DO  [facility] Pager [phone]  If 7PM-7AM, please contact night-coverage [email or web address] Password [email or web address] 09/03/2014, 3:33 PM   LOS: 0 days

## 2014-09-04 DIAGNOSIS — I5032 Chronic diastolic (congestive) heart failure: Secondary | ICD-10-CM

## 2014-09-04 MED ORDER — SEVELAMER CARBONATE 800 MG PO TABS
3200.0000 mg | ORAL_TABLET | Freq: Three times a day (TID) | ORAL | Status: DC
  Administered 2014-09-04: 3200 mg via ORAL
  Filled 2014-09-04 (×2): qty 4

## 2014-09-04 MED ORDER — SEVELAMER CARBONATE 800 MG PO TABS: 1600.0000 mg | ORAL_TABLET | ORAL | Status: DC | PRN

## 2014-09-04 MED ORDER — CARVEDILOL 25 MG PO TABS
25.0000 mg | ORAL_TABLET | Freq: Two times a day (BID) | ORAL | Status: DC
  Administered 2014-09-04: 25 mg via ORAL
  Filled 2014-09-04: qty 1

## 2014-09-04 NOTE — Plan of Care (Signed)
Problem: Phase I Progression Outcomes Goal: Voiding-avoid urinary catheter unless indicated Outcome: Progressing Pt is oliguric. Pt states she urinates twice per day.

## 2014-09-04 NOTE — Discharge Summary (Signed)
Physician Discharge Summary  [name] [name]:[id number] DOB: 1945-06-21 DOA: 09/03/2014  PCP: [name], MD  Admit date: 09/03/2014 Discharge date: 09/04/2014  Recommendations for Outpatient Follow-up:  1. Pt will need to follow up with PCP in 2 weeks post discharge Discharge Diagnoses:  Intractable vomiting and diarrhea -Likely viral gastroenteritis as the patient is already improving after 24 hours in the hospital -The patient only had 1 additional bowel movement after 24 hours of hospitalization with any hematochezia. -The patient did not have any further vomiting throughout the hospitalization -The diet was advanced which she is tolerating -GI pathogen panel was collected but results are pending at time of d/c Hyperkalemia -Patient presented with potassium 6.8 -Underwent emergent dialysis -improved with HD  Pulmonary hypertension -Continue Letairis Hypertension--systemic -Continue amlodipine and carvedilol -continue hydralazine and imdur ESRD -appreciate nephrology -continue phosphate binders Seizure disorder -continue Keppra Hypothyroidism -continue synthroid History of chronic combined systolic and diastolic CHF  -29/52/8413 echocardiogram EF 35-40%, grade 4 DD -compensated  Discharge Condition: stable  Disposition: home Follow-up Information    Follow up with [name], MD On 09/10/2014.   Specialty:  Internal Medicine   Why:  10:15am   Contact information:   [address] [place] [address] [phone]       Diet:renal with 1200cc fluid restriction Wt Readings from Last 3 Encounters:  09/04/14 74.6 kg (164 lb 7.4 oz)  05/26/14 75.297 kg (166 lb)  04/09/14 74.118 kg (163 lb 6.4 oz)    History of present illness:  69 year old female with a history of ESRD, COPD, hyponatremia, coronary artery disease status post RCA stent, hyperlipidemia presented with 2-3 days of nausea, vomiting, diarrhea or she denied any fevers, chills,  sick contacts, recent hospitalizations, raw or undercooked foods. She has some nonspecific abdominal pain which resolved after admission.  In the emergency department, the patient was found to have hyperkalemia with potassium 6.8 and elevated T waves. The patient was emergently taken to dialysis. She missed a dialysis on 09/01/2014 due to her illness. Since admission, the patient has improved clinically. Her vomiting has improved. In addition, the patient has not had a bowel movement since admission for the first 24 hours. She also had one soft bowel movement thereafter. Her vomiting improved. Her diet was advanced. Her electrolytes improved on repeat BMP. The patient was discharged in stable condition.  Consultants: nephrology  Discharge Exam: Filed Vitals:   09/04/14 0545  BP: 157/71  Pulse: 81  Temp: 97.9 F (36.6 C)  Resp: 18   Filed Vitals:   09/03/14 1428 09/03/14 2119 09/04/14 0156 09/04/14 0545  BP: 120/76 142/105 148/77 157/71  Pulse: 81 77 79 81  Temp: 99.4 F (37.4 C) 99 F (37.2 C) 98.1 F (36.7 C) 97.9 F (36.6 C)  TempSrc: Oral Oral Oral Oral  Resp: 18 18 18 18   Height:      Weight:    74.6 kg (164 lb 7.4 oz)  SpO2: 95% 96% 95% 96%   General: A&O x 3, NAD, pleasant, cooperative Cardiovascular: RRR, no rub, no gallop, no S3 Respiratory: CTAB, no wheeze, no rhonchi Abdomen:soft, nontender, nondistended, positive bowel sounds Extremities: No edema, No lymphangitis, no petechiae  Discharge Instructions  Discharge Instructions    Diet - low sodium heart healthy    Complete by:  As directed      Increase activity slowly    Complete by:  As directed             Medication List  STOP taking these medications        ondansetron 8 MG disintegrating tablet  Commonly known as:  ZOFRAN ODT      TAKE these medications        acetaminophen 500 MG tablet  Commonly known as:  TYLENOL  Take 500 mg by mouth every 6 (six) hours as needed for pain.     albuterol  108 (90 BASE) MCG/ACT inhaler  Commonly known as:  PROVENTIL HFA;VENTOLIN HFA  Inhale 1-2 puffs into the lungs every 6 (six) hours as needed for wheezing or shortness of breath.     ambrisentan 5 MG tablet  Commonly known as:  LETAIRIS  Take 1 tablet (5 mg total) by mouth daily.     amLODipine 10 MG tablet  Commonly known as:  NORVASC  Take 10 mg by mouth at bedtime.     aspirin EC 81 MG tablet  Take 81 mg by mouth daily.     carvedilol 25 MG tablet  Commonly known as:  COREG  Take 25 mg by mouth 2 (two) times daily with a meal.     hydrALAZINE 50 MG tablet  Commonly known as:  APRESOLINE  Take 50 mg by mouth 3 (three) times daily.     hydrOXYzine 25 MG tablet  Commonly known as:  ATARAX/VISTARIL  Take 1 tablet by mouth daily as needed. On dialysis days     isosorbide mononitrate 30 MG 24 hr tablet  Commonly known as:  IMDUR  TAKE 1 TABLET (30 MG TOTAL) BY MOUTH DAILY.     levETIRAcetam 500 MG tablet  Commonly known as:  KEPPRA  Take 500 mg by mouth daily at 8 pm.     levothyroxine 88 MCG tablet  Commonly known as:  SYNTHROID, LEVOTHROID  Take 88 mcg by mouth daily before breakfast.     multivitamin Tabs tablet  Take 1 tablet by mouth daily.     NITROSTAT 0.4 MG SL tablet  Generic drug:  nitroGLYCERIN  Place 0.4 mg under the tongue every 5 (five) minutes as needed for chest pain.     omeprazole 20 MG tablet  Commonly known as:  PRILOSEC OTC  Take 20 mg by mouth daily.     pravastatin 40 MG tablet  Commonly known as:  PRAVACHOL  Take 40 mg by mouth daily.     SENSIPAR 90 MG tablet  Generic drug:  cinacalcet  Take 1 tablet by mouth daily.     sevelamer carbonate 800 MG tablet  Commonly known as:  RENVELA  Take 1,600-3,200 mg by mouth 3 (three) times daily. 4 tabs TID, 2 tabs with each snack     traMADol 50 MG tablet  Commonly known as:  ULTRAM  Take 1 tablet by mouth daily as needed.     traZODone 50 MG tablet  Commonly known as:  DESYREL  Take 50 mg  by mouth at bedtime as needed for sleep.         The results of significant diagnostics from this hospitalization (including imaging, microbiology, ancillary and laboratory) are listed below for reference.    Significant Diagnostic Studies: Dg Abd Acute W/chest  09/03/2014   CLINICAL DATA:  Mid abdominal pain, nausea and vomiting.  EXAM: DG ABDOMEN ACUTE W/ 1V CHEST  COMPARISON:  CT 03/19/2014  FINDINGS: The abdominal gas pattern is negative for obstruction or perforation. There are extensive vascular calcifications in the abdomen. There are new ground-glass opacities in the central and basilar lungs which could  represent alveolar edema or infectious infiltrate. No large effusion is evident.  IMPRESSION: Negative for obstruction or perforation. Ground-glass opacities in both lungs could represent alveolar edema or infectious infiltrate.   Electronically Signed   By: Andreas Newport M.D.   On: 09/03/2014 04:48     Microbiology: No results found for this or any previous visit (from the past 240 hour(s)).   Labs: Basic Metabolic Panel:  Recent Labs Lab 09/03/14 0031 09/03/14 0309 09/03/14 0640 09/03/14 1140  NA 135 134* 137 137  K 6.8* 5.3* 5.4* 4.4  CL 96* 98* 97* 97*  CO2 27 25 27 30   GLUCOSE 106* 55* 58* 99  BUN 49* 51* 53* 15  CREATININE 12.03* 11.51* 12.09* 5.66*  CALCIUM 9.0 14.4* 9.3 8.6*  PHOS  --  4.1 4.5  --    Liver Function Tests:  Recent Labs Lab 09/03/14 0031 09/03/14 0309 09/03/14 0640 09/03/14 1140  AST 15  --   --  20  ALT 12*  --   --  11*  ALKPHOS 97  --   --  98  BILITOT 0.7  --   --  0.9  PROT 6.2*  --   --  6.4*  ALBUMIN 3.2* 3.1* 3.0* 3.0*    Recent Labs Lab 09/03/14 0031  LIPASE 27   No results for input(s): AMMONIA in the last 168 hours. CBC:  Recent Labs Lab 09/03/14 0031 09/03/14 0309 09/03/14 0639 09/03/14 1140  WBC 6.8 6.0 6.7 5.0  NEUTROABS  --   --   --  3.3  HGB 9.9* 10.2* 9.4* 10.2*  HCT 30.6* 31.2* 28.7* 31.6*  MCV  83.4 83.9 82.9 83.6  PLT 159 132* 163 155   Cardiac Enzymes: No results for input(s): CKTOTAL, CKMB, CKMBINDEX, TROPONINI in the last 168 hours. BNP: Invalid input(s): POCBNP CBG: No results for input(s): GLUCAP in the last 168 hours.  Time coordinating discharge:  Greater than 30 minutes  Signed:  Ramsey Guadamuz, DO [facility] Pager: [phone] 09/04/2014, 1:24 PM

## 2014-09-04 NOTE — Progress Notes (Signed)
Utilization review completed. Esparanza Krider, RN, BSN. 

## 2014-09-04 NOTE — Progress Notes (Signed)
[facility] ROUNDING NOTE   Subjective:   Interval History: no complaints  Objective:  Vital signs in last 24 hours:  Temp:  [97.9 F (36.6 C)-99.4 F (37.4 C)] 97.9 F (36.6 C) (09/02 0545) Pulse Rate:  [77-81] 81 (09/02 0545) Resp:  [18] 18 (09/02 0545) BP: (120-157)/(50-105) 157/71 mmHg (09/02 0545) SpO2:  [94 %-96 %] 96 % (09/02 0545) Weight:  [74.6 kg (164 lb 7.4 oz)] 74.6 kg (164 lb 7.4 oz) (09/02 0545)  Weight change: -3.746 kg (-8 lb 4.1 oz) Filed Weights   09/03/14 0004 09/03/14 0855 09/04/14 0545  Weight: 78.245 kg (172 lb 8 oz) 74.5 kg (164 lb 3.9 oz) 74.6 kg (164 lb 7.4 oz)    Intake/Output: I/O last 3 completed shifts: In: 61 [P.O.:960] Out: 3503 [VELFY:1017]   Intake/Output this shift:  Total I/O In: 240 [P.O.:240] Out: -   CVS- RRR RS- CTA ABD- BS present soft non-distended EXT- no edema AVF no issues Basic Metabolic Panel:  Recent Labs Lab 09/03/14 0031 09/03/14 0309 09/03/14 0640 09/03/14 1140  NA 135 134* 137 137  K 6.8* 5.3* 5.4* 4.4  CL 96* 98* 97* 97*  CO2 27 25 27 30   GLUCOSE 106* 55* 58* 99  BUN 49* 51* 53* 15  CREATININE 12.03* 11.51* 12.09* 5.66*  CALCIUM 9.0 14.4* 9.3 8.6*  PHOS  --  4.1 4.5  --     Liver Function Tests:  Recent Labs Lab 09/03/14 0031 09/03/14 0309 09/03/14 0640 09/03/14 1140  AST 15  --   --  20  ALT 12*  --   --  11*  ALKPHOS 97  --   --  98  BILITOT 0.7  --   --  0.9  PROT 6.2*  --   --  6.4*  ALBUMIN 3.2* 3.1* 3.0* 3.0*    Recent Labs Lab 09/03/14 0031  LIPASE 27   No results for input(s): AMMONIA in the last 168 hours.  CBC:  Recent Labs Lab 09/03/14 0031 09/03/14 0309 09/03/14 0639 09/03/14 1140  WBC 6.8 6.0 6.7 5.0  NEUTROABS  --   --   --  3.3  HGB 9.9* 10.2* 9.4* 10.2*  HCT 30.6* 31.2* 28.7* 31.6*  MCV 83.4 83.9 82.9 83.6  PLT 159 132* 163 155    Cardiac Enzymes: No results for input(s): CKTOTAL, CKMB, CKMBINDEX, TROPONINI in the last 168  hours.  BNP: Invalid input(s): POCBNP  CBG: No results for input(s): GLUCAP in the last 168 hours.  Microbiology: Results for orders placed or performed during the hospital encounter of 06/27/12  Blood culture (routine x 2)     Status: None   Collection Time: 06/27/12  6:30 PM  Result Value Ref Range Status   Specimen Description BLOOD HAND RIGHT  Final   Special Requests BOTTLES DRAWN AEROBIC ONLY 4CC  Final   Culture  Setup Time 06/28/2012 03:17  Final   Culture NO GROWTH 5 DAYS  Final   Report Status 07/04/2012 FINAL  Final  Blood culture (routine x 2)     Status: None   Collection Time: 06/27/12  6:35 PM  Result Value Ref Range Status   Specimen Description BLOOD ARM RIGHT  Final   Special Requests BOTTLES DRAWN AEROBIC ONLY 4CC  Final   Culture  Setup Time 06/28/2012 03:16  Final   Culture NO GROWTH 5 DAYS  Final   Report Status 07/04/2012 FINAL  Final  Clostridium Difficile by PCR     Status: None  Collection Time: 06/27/12  9:22 PM  Result Value Ref Range Status   Toxigenic C Difficile by pcr NEGATIVE NEGATIVE Final  MRSA PCR Screening     Status: None   Collection Time: 06/28/12  2:12 AM  Result Value Ref Range Status   MRSA by PCR NEGATIVE NEGATIVE Final    Comment:        The GeneXpert MRSA Assay (FDA approved for NASAL specimens only), is one component of a comprehensive MRSA colonization surveillance program. It is not intended to diagnose MRSA infection nor to guide or monitor treatment for MRSA infections.    Coagulation Studies: No results for input(s): LABPROT, INR in the last 72 hours.  Urinalysis:  Recent Labs  09/03/14 0018  COLORURINE YELLOW  LABSPEC 1.010  PHURINE 5.5  GLUCOSEU 250*  HGBUR TRACE*  BILIRUBINUR NEGATIVE  KETONESUR NEGATIVE  PROTEINUR 100*  UROBILINOGEN 0.2  NITRITE NEGATIVE  LEUKOCYTESUR NEGATIVE      Imaging: Dg Abd Acute W/chest  09/03/2014   CLINICAL DATA:  Mid abdominal pain, nausea and vomiting.  EXAM: DG  ABDOMEN ACUTE W/ 1V CHEST  COMPARISON:  CT 03/19/2014  FINDINGS: The abdominal gas pattern is negative for obstruction or perforation. There are extensive vascular calcifications in the abdomen. There are new ground-glass opacities in the central and basilar lungs which could represent alveolar edema or infectious infiltrate. No large effusion is evident.  IMPRESSION: Negative for obstruction or perforation. Ground-glass opacities in both lungs could represent alveolar edema or infectious infiltrate.   Electronically Signed   By: Andreas Newport M.D.   On: 09/03/2014 04:48     Medications:     . ambrisentan  5 mg Oral Daily  . amLODipine  10 mg Oral QHS  . aspirin EC  81 mg Oral Daily  . carvedilol  25 mg Oral BID WC  . cinacalcet  90 mg Oral Q supper  . hydrALAZINE  50 mg Oral TID  . isosorbide mononitrate  30 mg Oral Daily  . levETIRAcetam  500 mg Oral Q2000  . levothyroxine  88 mcg Oral QAC breakfast  . multivitamin  1 tablet Oral Daily  . pantoprazole  20 mg Oral Daily  . pravastatin  40 mg Oral Daily  . sevelamer carbonate  3,200 mg Oral TID WC  . sodium chloride  3 mL Intravenous Q12H   sodium chloride, sodium chloride, acetaminophen **OR** acetaminophen, albuterol, alteplase, hydrOXYzine, lidocaine (PF), lidocaine-prilocaine, nitroGLYCERIN, ondansetron **OR** ondansetron (ZOFRAN) IV, pentafluoroprop-tetrafluoroeth, sevelamer carbonate, traZODone  Assessment/ Plan:   ESRD- HD MWF K 4.4  ANEMIA- Hb 10  MBD- calcium 8.7 sensipar  HTN/VOL- controlled  ACCESS- AVF no issues     LOS: 1 [name] W @TODAY @9 :43 AM

## 2014-09-07 DIAGNOSIS — D631 Anemia in chronic kidney disease: Secondary | ICD-10-CM | POA: Diagnosis not present

## 2014-09-07 DIAGNOSIS — N186 End stage renal disease: Secondary | ICD-10-CM | POA: Diagnosis not present

## 2014-09-07 DIAGNOSIS — E119 Type 2 diabetes mellitus without complications: Secondary | ICD-10-CM | POA: Diagnosis not present

## 2014-09-07 DIAGNOSIS — D509 Iron deficiency anemia, unspecified: Secondary | ICD-10-CM | POA: Diagnosis not present

## 2014-09-07 DIAGNOSIS — N2581 Secondary hyperparathyroidism of renal origin: Secondary | ICD-10-CM | POA: Diagnosis not present

## 2014-09-09 DIAGNOSIS — D631 Anemia in chronic kidney disease: Secondary | ICD-10-CM | POA: Diagnosis not present

## 2014-09-09 DIAGNOSIS — N2581 Secondary hyperparathyroidism of renal origin: Secondary | ICD-10-CM | POA: Diagnosis not present

## 2014-09-09 DIAGNOSIS — D509 Iron deficiency anemia, unspecified: Secondary | ICD-10-CM | POA: Diagnosis not present

## 2014-09-09 DIAGNOSIS — N186 End stage renal disease: Secondary | ICD-10-CM | POA: Diagnosis not present

## 2014-09-09 DIAGNOSIS — E119 Type 2 diabetes mellitus without complications: Secondary | ICD-10-CM | POA: Diagnosis not present

## 2014-09-10 DIAGNOSIS — E875 Hyperkalemia: Secondary | ICD-10-CM | POA: Diagnosis not present

## 2014-09-10 DIAGNOSIS — R7309 Other abnormal glucose: Secondary | ICD-10-CM | POA: Diagnosis not present

## 2014-09-10 DIAGNOSIS — E039 Hypothyroidism, unspecified: Secondary | ICD-10-CM | POA: Diagnosis not present

## 2014-09-10 LAB — GI PATHOGEN PANEL BY PCR, STOOL

## 2014-09-11 DIAGNOSIS — E119 Type 2 diabetes mellitus without complications: Secondary | ICD-10-CM | POA: Diagnosis not present

## 2014-09-11 DIAGNOSIS — D631 Anemia in chronic kidney disease: Secondary | ICD-10-CM | POA: Diagnosis not present

## 2014-09-11 DIAGNOSIS — N186 End stage renal disease: Secondary | ICD-10-CM | POA: Diagnosis not present

## 2014-09-11 DIAGNOSIS — D509 Iron deficiency anemia, unspecified: Secondary | ICD-10-CM | POA: Diagnosis not present

## 2014-09-11 DIAGNOSIS — N2581 Secondary hyperparathyroidism of renal origin: Secondary | ICD-10-CM | POA: Diagnosis not present

## 2014-09-14 DIAGNOSIS — D509 Iron deficiency anemia, unspecified: Secondary | ICD-10-CM | POA: Diagnosis not present

## 2014-09-14 DIAGNOSIS — E119 Type 2 diabetes mellitus without complications: Secondary | ICD-10-CM | POA: Diagnosis not present

## 2014-09-14 DIAGNOSIS — N186 End stage renal disease: Secondary | ICD-10-CM | POA: Diagnosis not present

## 2014-09-14 DIAGNOSIS — N2581 Secondary hyperparathyroidism of renal origin: Secondary | ICD-10-CM | POA: Diagnosis not present

## 2014-09-14 DIAGNOSIS — D631 Anemia in chronic kidney disease: Secondary | ICD-10-CM | POA: Diagnosis not present

## 2014-09-16 DIAGNOSIS — N186 End stage renal disease: Secondary | ICD-10-CM | POA: Diagnosis not present

## 2014-09-16 DIAGNOSIS — N2581 Secondary hyperparathyroidism of renal origin: Secondary | ICD-10-CM | POA: Diagnosis not present

## 2014-09-16 DIAGNOSIS — D509 Iron deficiency anemia, unspecified: Secondary | ICD-10-CM | POA: Diagnosis not present

## 2014-09-16 DIAGNOSIS — D631 Anemia in chronic kidney disease: Secondary | ICD-10-CM | POA: Diagnosis not present

## 2014-09-16 DIAGNOSIS — E119 Type 2 diabetes mellitus without complications: Secondary | ICD-10-CM | POA: Diagnosis not present

## 2014-09-18 DIAGNOSIS — D509 Iron deficiency anemia, unspecified: Secondary | ICD-10-CM | POA: Diagnosis not present

## 2014-09-18 DIAGNOSIS — D631 Anemia in chronic kidney disease: Secondary | ICD-10-CM | POA: Diagnosis not present

## 2014-09-18 DIAGNOSIS — N2581 Secondary hyperparathyroidism of renal origin: Secondary | ICD-10-CM | POA: Diagnosis not present

## 2014-09-18 DIAGNOSIS — N186 End stage renal disease: Secondary | ICD-10-CM | POA: Diagnosis not present

## 2014-09-18 DIAGNOSIS — E119 Type 2 diabetes mellitus without complications: Secondary | ICD-10-CM | POA: Diagnosis not present

## 2014-09-21 DIAGNOSIS — N186 End stage renal disease: Secondary | ICD-10-CM | POA: Diagnosis not present

## 2014-09-21 DIAGNOSIS — D631 Anemia in chronic kidney disease: Secondary | ICD-10-CM | POA: Diagnosis not present

## 2014-09-21 DIAGNOSIS — E119 Type 2 diabetes mellitus without complications: Secondary | ICD-10-CM | POA: Diagnosis not present

## 2014-09-21 DIAGNOSIS — D509 Iron deficiency anemia, unspecified: Secondary | ICD-10-CM | POA: Diagnosis not present

## 2014-09-21 DIAGNOSIS — N2581 Secondary hyperparathyroidism of renal origin: Secondary | ICD-10-CM | POA: Diagnosis not present

## 2014-09-23 ENCOUNTER — Encounter (HOSPITAL_COMMUNITY): Payer: Self-pay | Admitting: Emergency Medicine

## 2014-09-23 ENCOUNTER — Emergency Department (HOSPITAL_COMMUNITY)
Admission: EM | Admit: 2014-09-23 | Discharge: 2014-09-23 | Disposition: A | Discharge status: Home / Self Care | Payer: Medicare Other | Attending: Emergency Medicine | Admitting: Emergency Medicine

## 2014-09-23 DIAGNOSIS — I2510 Atherosclerotic heart disease of native coronary artery without angina pectoris: Secondary | ICD-10-CM | POA: Insufficient documentation

## 2014-09-23 DIAGNOSIS — I120 Hypertensive chronic kidney disease with stage 5 chronic kidney disease or end stage renal disease: Secondary | ICD-10-CM | POA: Insufficient documentation

## 2014-09-23 DIAGNOSIS — Z859 Personal history of malignant neoplasm, unspecified: Secondary | ICD-10-CM | POA: Insufficient documentation

## 2014-09-23 DIAGNOSIS — I5030 Unspecified diastolic (congestive) heart failure: Secondary | ICD-10-CM | POA: Diagnosis not present

## 2014-09-23 DIAGNOSIS — Z720 Tobacco use: Secondary | ICD-10-CM | POA: Diagnosis not present

## 2014-09-23 DIAGNOSIS — G40909 Epilepsy, unspecified, not intractable, without status epilepticus: Secondary | ICD-10-CM | POA: Insufficient documentation

## 2014-09-23 DIAGNOSIS — E785 Hyperlipidemia, unspecified: Secondary | ICD-10-CM | POA: Insufficient documentation

## 2014-09-23 DIAGNOSIS — E039 Hypothyroidism, unspecified: Secondary | ICD-10-CM | POA: Diagnosis not present

## 2014-09-23 DIAGNOSIS — R1111 Vomiting without nausea: Secondary | ICD-10-CM | POA: Diagnosis not present

## 2014-09-23 DIAGNOSIS — R404 Transient alteration of awareness: Secondary | ICD-10-CM | POA: Diagnosis not present

## 2014-09-23 DIAGNOSIS — Z7982 Long term (current) use of aspirin: Secondary | ICD-10-CM | POA: Insufficient documentation

## 2014-09-23 DIAGNOSIS — J449 Chronic obstructive pulmonary disease, unspecified: Secondary | ICD-10-CM | POA: Insufficient documentation

## 2014-09-23 DIAGNOSIS — N186 End stage renal disease: Secondary | ICD-10-CM | POA: Insufficient documentation

## 2014-09-23 DIAGNOSIS — Z79899 Other long term (current) drug therapy: Secondary | ICD-10-CM | POA: Diagnosis not present

## 2014-09-23 DIAGNOSIS — Z8673 Personal history of transient ischemic attack (TIA), and cerebral infarction without residual deficits: Secondary | ICD-10-CM | POA: Insufficient documentation

## 2014-09-23 DIAGNOSIS — F329 Major depressive disorder, single episode, unspecified: Secondary | ICD-10-CM | POA: Diagnosis not present

## 2014-09-23 DIAGNOSIS — R112 Nausea with vomiting, unspecified: Secondary | ICD-10-CM | POA: Diagnosis not present

## 2014-09-23 DIAGNOSIS — I129 Hypertensive chronic kidney disease with stage 1 through stage 4 chronic kidney disease, or unspecified chronic kidney disease: Secondary | ICD-10-CM | POA: Diagnosis not present

## 2014-09-23 DIAGNOSIS — R197 Diarrhea, unspecified: Secondary | ICD-10-CM | POA: Diagnosis not present

## 2014-09-23 DIAGNOSIS — Z880 Allergy status to penicillin: Secondary | ICD-10-CM | POA: Insufficient documentation

## 2014-09-23 LAB — CBC WITH DIFFERENTIAL/PLATELET
BASOS ABS: 0 10*3/uL (ref 0.0–0.1)
Basophils Relative: 1 %
EOS ABS: 0.1 10*3/uL (ref 0.0–0.7)
Eosinophils Relative: 2 %
HCT: 34.9 % — ABNORMAL LOW (ref 36.0–46.0)
HEMOGLOBIN: 11.2 g/dL — AB (ref 12.0–15.0)
LYMPHS ABS: 1 10*3/uL (ref 0.7–4.0)
LYMPHS PCT: 15 %
MCH: 27.1 pg (ref 26.0–34.0)
MCHC: 32.1 g/dL (ref 30.0–36.0)
MCV: 84.3 fL (ref 78.0–100.0)
Monocytes Absolute: 0.5 10*3/uL (ref 0.1–1.0)
Monocytes Relative: 8 %
NEUTROS PCT: 74 %
Neutro Abs: 4.7 10*3/uL (ref 1.7–7.7)
Platelets: 177 10*3/uL (ref 150–400)
RBC: 4.14 MIL/uL (ref 3.87–5.11)
RDW: 15.4 % (ref 11.5–15.5)
WBC: 6.3 10*3/uL (ref 4.0–10.5)

## 2014-09-23 LAB — BASIC METABOLIC PANEL
Anion gap: 13 (ref 5–15)
BUN: 30 mg/dL — ABNORMAL HIGH (ref 6–20)
CHLORIDE: 97 mmol/L — AB (ref 101–111)
CO2: 26 mmol/L (ref 22–32)
CREATININE: 9.12 mg/dL — AB (ref 0.44–1.00)
Calcium: 9.3 mg/dL (ref 8.9–10.3)
GFR calc non Af Amer: 4 mL/min — ABNORMAL LOW (ref >60)
GFR, EST AFRICAN AMERICAN: 4 mL/min — AB (ref >60)
Glucose, Bld: 80 mg/dL (ref 65–99)
POTASSIUM: 5.5 mmol/L — AB (ref 3.5–5.1)
SODIUM: 136 mmol/L (ref 135–145)

## 2014-09-23 MED ORDER — SODIUM CHLORIDE 0.9 % IV BOLUS (SEPSIS)
500.0000 mL | Freq: Once | INTRAVENOUS | Status: AC
  Administered 2014-09-23: 500 mL via INTRAVENOUS

## 2014-09-23 MED ORDER — ONDANSETRON HCL 4 MG/2ML IJ SOLN
4.0000 mg | Freq: Once | INTRAMUSCULAR | Status: AC
  Administered 2014-09-23: 4 mg via INTRAVENOUS
  Filled 2014-09-23: qty 2

## 2014-09-23 MED ORDER — ONDANSETRON 8 MG PO TBDP: 4.0000 mg | ORAL_TABLET | Freq: Three times a day (TID) | ORAL | Status: DC | PRN

## 2014-09-23 NOTE — Discharge Instructions (Signed)
End-Stage Kidney Disease The kidneys are two organs that lie on either side of the spine between the middle of the back and the front of the abdomen. The kidneys:   Remove wastes and extra water from the blood.   Produce important hormones. These help keep bones strong, regulate blood pressure, and help create red blood cells.   Balance the fluids and chemicals in the blood and tissues. End-stage kidney disease occurs when the kidneys are so damaged that they cannot do their job. When the kidneys cannot do their job, life-threatening problems occur. The body cannot stay clean and strong without the help of the kidneys. In end-stage kidney disease, the kidneys cannot get better.You need a new kidney or treatments to do some of the work healthy kidneys do in order to stay alive. CAUSES  End-stage kidney disease usually occurs when a long-lasting (chronic) kidney disease gets worse. It may also occur after the kidneys are suddenly damaged (acute kidney injury).  SYMPTOMS   Swelling (edema) of the legs, ankles, or feet.   Tiredness (lethargy).   Nausea or vomiting.   Confusion.   Problems with urination, such as:   Decreased urine production.   Frequent urination, especially at night.   Frequent accidents in children who are potty trained.   Muscle twitches and cramps.   Persistent itchiness.   Loss of appetite.   Headaches.   Abnormally dark or light skin.   Numbness in the hands or feet.   Easy bruising.   Frequent hiccups.   Menstruation stops. DIAGNOSIS  Your health care provider will measure your blood pressure and take some tests. These may include:   Urine tests.   Blood tests.   Imaging tests, such as:   An ultrasound exam.   Computed tomography (CT).  A kidney biopsy. TREATMENT  There are two treatments for end-stage kidney disease:   A procedure that removes toxic wastes from the body (dialysis).   Receiving a new kidney  (kidney transplant). Both of these treatments have serious risks and consequences. Your health care provider will help you determine which treatment is best for you based on your health, age, and other factors. In addition to having dialysis or a kidney transplant, you may need to take medicines to control high blood pressure (hypertension) and cholesterol and to decrease phosphorus levels in your blood.  HOME CARE INSTRUCTIONS  Follow your prescribed diet.   Take medicines only as directed by your health care provider.   Do not take any new medicines (prescription, over-the-counter, or nutritional supplements) unless approved by your health care provider. Many medicines can worsen your kidney damage or need to have the dose adjusted.   Keep all follow-up visits as directed by your health care provider. MAKE SURE YOU:  Understand these instructions.  Will watch your condition.  Will get help right away if you are not doing well or get worse. Document Released: 03/11/2003 Document Revised: 05/05/2013 Document Reviewed: 08/18/2011 [facility] Patient Information 2015 [place], [place]. This information is not intended to replace advice given to you by your health care provider. Make sure you discuss any questions you have with your health care provider.  Diarrhea Diarrhea is frequent loose and watery bowel movements. It can cause you to feel weak and dehydrated. Dehydration can cause you to become tired and thirsty, have a dry mouth, and have decreased urination that often is dark yellow. Diarrhea is a sign of another problem, most often an infection that will not last long.  In most cases, diarrhea typically lasts 2-3 days. However, it can last longer if it is a sign of something more serious. It is important to treat your diarrhea as directed by your caregiver to lessen or prevent future episodes of diarrhea. CAUSES  Some common causes include:  Gastrointestinal infections caused by viruses,  bacteria, or parasites.  Food poisoning or food allergies.  Certain medicines, such as antibiotics, chemotherapy, and laxatives.  Artificial sweeteners and fructose.  Digestive disorders. HOME CARE INSTRUCTIONS  Ensure adequate fluid intake (hydration): Have 1 cup (8 oz) of fluid for each diarrhea episode. Avoid fluids that contain simple sugars or sports drinks, fruit juices, whole milk products, and sodas. Your urine should be clear or pale yellow if you are drinking enough fluids. Hydrate with an oral rehydration solution that you can purchase at pharmacies, retail stores, and online. You can prepare an oral rehydration solution at home by mixing the following ingredients together:   - tsp table salt.   tsp baking soda.   tsp salt substitute containing potassium chloride.  1  tablespoons sugar.  1 L (34 oz) of water.  Certain foods and beverages may increase the speed at which food moves through the gastrointestinal (GI) tract. These foods and beverages should be avoided and include:  Caffeinated and alcoholic beverages.  High-fiber foods, such as raw fruits and vegetables, nuts, seeds, and whole grain breads and cereals.  Foods and beverages sweetened with sugar alcohols, such as xylitol, sorbitol, and mannitol.  Some foods may be well tolerated and may help thicken stool including:  Starchy foods, such as rice, toast, pasta, low-sugar cereal, oatmeal, grits, baked potatoes, crackers, and bagels.  Bananas.  Applesauce.  Add probiotic-rich foods to help increase healthy bacteria in the GI tract, such as yogurt and fermented milk products.  Wash your hands well after each diarrhea episode.  Only take over-the-counter or prescription medicines as directed by your caregiver.  Take a warm bath to relieve any burning or pain from frequent diarrhea episodes. SEEK IMMEDIATE MEDICAL CARE IF:   You are unable to keep fluids down.  You have persistent vomiting.  You have  blood in your stool, or your stools are black and tarry.  You do not urinate in 6-8 hours, or there is only a small amount of very dark urine.  You have abdominal pain that increases or localizes.  You have weakness, dizziness, confusion, or light-headedness.  You have a severe headache.  Your diarrhea gets worse or does not get better.  You have a fever or persistent symptoms for more than 2-3 days.  You have a fever and your symptoms suddenly get worse. MAKE SURE YOU:   Understand these instructions.  Will watch your condition.  Will get help right away if you are not doing well or get worse. Document Released: 12/09/2001 Document Revised: 05/05/2013 Document Reviewed: 08/27/2011 [facility] Patient Information 2015 [place], [place]. This information is not intended to replace advice given to you by your health care provider. Make sure you discuss any questions you have with your health care provider.  Nausea and Vomiting Nausea is a sick feeling that often comes before throwing up (vomiting). Vomiting is a reflex where stomach contents come out of your mouth. Vomiting can cause severe loss of body fluids (dehydration). Children and elderly adults can become dehydrated quickly, especially if they also have diarrhea. Nausea and vomiting are symptoms of a condition or disease. It is important to find the cause of your symptoms. CAUSES  Direct irritation of the stomach lining. This irritation can result from increased acid production (gastroesophageal reflux disease), infection, food poisoning, taking certain medicines (such as nonsteroidal anti-inflammatory drugs), alcohol use, or tobacco use.  Signals from the brain.These signals could be caused by a headache, heat exposure, an inner ear disturbance, increased pressure in the brain from injury, infection, a tumor, or a concussion, pain, emotional stimulus, or metabolic problems.  An obstruction in the gastrointestinal tract (bowel  obstruction).  Illnesses such as diabetes, hepatitis, gallbladder problems, appendicitis, kidney problems, cancer, sepsis, atypical symptoms of a heart attack, or eating disorders.  Medical treatments such as chemotherapy and radiation.  Receiving medicine that makes you sleep (general anesthetic) during surgery. DIAGNOSIS Your caregiver may ask for tests to be done if the problems do not improve after a few days. Tests may also be done if symptoms are severe or if the reason for the nausea and vomiting is not clear. Tests may include:  Urine tests.  Blood tests.  Stool tests.  Cultures (to look for evidence of infection).  X-rays or other imaging studies. Test results can help your caregiver make decisions about treatment or the need for additional tests. TREATMENT You need to stay well hydrated. Drink frequently but in small amounts.You may wish to drink water, sports drinks, clear broth, or eat frozen ice pops or gelatin dessert to help stay hydrated.When you eat, eating slowly may help prevent nausea.There are also some antinausea medicines that may help prevent nausea. HOME CARE INSTRUCTIONS   Take all medicine as directed by your caregiver.  If you do not have an appetite, do not force yourself to eat. However, you must continue to drink fluids.  If you have an appetite, eat a normal diet unless your caregiver tells you differently.  Eat a variety of complex carbohydrates (rice, wheat, potatoes, bread), lean meats, yogurt, fruits, and vegetables.  Avoid high-fat foods because they are more difficult to digest.  Drink enough water and fluids to keep your urine clear or pale yellow.  If you are dehydrated, ask your caregiver for specific rehydration instructions. Signs of dehydration may include:  Severe thirst.  Dry lips and mouth.  Dizziness.  Dark urine.  Decreasing urine frequency and amount.  Confusion.  Rapid breathing or pulse. SEEK IMMEDIATE MEDICAL  CARE IF:   You have blood or brown flecks (like coffee grounds) in your vomit.  You have black or bloody stools.  You have a severe headache or stiff neck.  You are confused.  You have severe abdominal pain.  You have chest pain or trouble breathing.  You do not urinate at least once every 8 hours.  You develop cold or clammy skin.  You continue to vomit for longer than 24 to 48 hours.  You have a fever. MAKE SURE YOU:   Understand these instructions.  Will watch your condition.  Will get help right away if you are not doing well or get worse. Document Released: 12/19/2004 Document Revised: 03/13/2011 Document Reviewed: 05/18/2010 [facility] Patient Information 2015 [place], [place]. This information is not intended to replace advice given to you by your health care provider. Make sure you discuss any questions you have with your health care provider.

## 2014-09-23 NOTE — ED Notes (Signed)
Per EMS, pt started having nausea and diarrhea this morning at dialysis. Pt reports chills. Pt took some phenergan this AM, but reports vomiting the meds up. Pt states that her nausea eased some with some comfort O2. Pt states that her feet feel itchy.

## 2014-09-23 NOTE — ED Provider Notes (Signed)
CSN: [id number]     Arrival date & time 09/23/14  3016 History   First MD Initiated Contact with Patient 09/23/14 0700     Chief Complaint  Patient presents with  . Nausea  . Emesis  . Diarrhea     (Consider location/radiation/quality/duration/timing/severity/associated sxs/prior Treatment) Patient is a 69 y.o. female presenting with vomiting and diarrhea. The history is provided by the patient.  Emesis Severity:  Moderate Associated symptoms: diarrhea   Associated symptoms: no abdominal pain and no headaches   Diarrhea Associated symptoms: vomiting   Associated symptoms: no abdominal pain and no headaches    patient presents with nausea vomiting diarrhea. Began around for the morning. States she has watery diarrhea. States she's been unable to keep medicines down. She is a dialysis patient and was due to get dialysis today. She states she has at her dry weight. States she feels somewhat bad all over. Mild abdominal cramping but no frank abdominal pain. No shortness of breath. States she just feels bad all over. No sick contacts. No recent travel. No recent antibiotics. Around 3 weeks ago had similar symptoms and had to be admitted to the hospital. Today was her dialysis day but she had full dialysis course 2 days ago as scheduled. She states she is at her dry weight already.  Past Medical History  Diagnosis Date  . CAD (coronary artery disease)     stent to RCA  . CVA (cerebral infarction) 2003    no apparent residual  . Hypothyroidism   . PVD (peripheral vascular disease)   . Hyperlipidemia   . Positive PPD     completed rifampin  . Diastolic congestive heart failure   . Hypertension   . COPD (chronic obstructive pulmonary disease) t  . Cancer     clear cell cancer, kidney  . Complication of anesthesia 12/2010    pt is very confused, with AMS with anesthesia  . CVA 07/27/2008    CVA affected cognition and memory per family, no focal deficits.    . ESRD 07/27/2008    ESRD  due to HTN and NSAID's, started hemodialysis in 2005 in [place], [place]. Went to [facility] from 2010 to 2012 and since 2012 has been getting dialysis at [facility] on [address] in [place] on a MWF schedule. First access with RUA AVG placed in [place]. Next and current access was LUA AVG placed by Dr. Lucky Cowboy in [place] in or around 2012. Has had 2 or 3 procedures on that graft since placed per family. She gets her access work done here in [place] now. She is allergic to heparin and does not get any heparin at dialysis; she had an allergic reaction apparently when in ICU in the past.      . Encephalopathy   . Seizures   . Dyslipidemia   . Chronic renal insufficiency     On hemodialysis  . Arthritis of shoulder     Bilateral  . Depression    Past Surgical History  Procedure Laterality Date  . Abdominal hysterectomy    . Cholecystectomy    . D&cs    . Right ankle repair    . Left nephrectomy    . Vascular surgery  11/2010    graft inserted to left arm  . Nephrectomy Left     Malignant tumor  . Cataract extraction Bilateral   . Tonsillectomy    . Cardiac catheterization    . Left heart catheterization with coronary angiogram N/A 02/22/2011  Procedure: LEFT HEART CATHETERIZATION WITH CORONARY ANGIOGRAM;  Surgeon: Wellington Hampshire, MD;  Location: [facility] CATH LAB;  Service: Cardiovascular;  Laterality: N/A;  . Right heart catheterization N/A 01/29/2014    Procedure: RIGHT HEART CATH;  Surgeon: Larey Dresser, MD;  Location: [facility] CATH LAB;  Service: Cardiovascular;  Laterality: N/A;   Family History  Problem Relation Age of Onset  . Heart disease Father   . Hypertension Mother   . Dementia Mother   . Coronary artery disease Sister   . Heart attack Sister   . Hypertension Brother    Social History  Substance Use Topics  . Smoking status: Current Every Day Smoker -- 1.00 packs/day for 53 years    Types: Cigarettes  . Smokeless tobacco: Never Used     Comment: smoking  .25ppd 05/26/14.  [name] Alcohol Use: 0.0 oz/week    0 Standard drinks or equivalent per week     Comment: very rarely per pt   OB History    No data available     Review of Systems  Constitutional: Positive for fatigue. Negative for activity change and appetite change.  Eyes: Negative for pain.  Respiratory: Negative for chest tightness and shortness of breath.   Cardiovascular: Negative for chest pain and leg swelling.  Gastrointestinal: Positive for vomiting and diarrhea. Negative for nausea and abdominal pain.  Genitourinary: Negative for flank pain.  Musculoskeletal: Negative for back pain and neck stiffness.  Skin: Negative for rash.  Neurological: Negative for weakness, numbness and headaches.  Psychiatric/Behavioral: Negative for behavioral problems.      Allergies  Heparin; Iohexol; Phenytoin; Wellbutrin; Ampicillin; Meperidine hcl; Morphine; Penicillins; Valproic acid and related; Ace inhibitors; and Pentazocine lactate  Home Medications   Prior to Admission medications   Medication Sig Start Date End Date Taking? Authorizing Shenaya Lebo  acetaminophen (TYLENOL) 500 MG tablet Take 500 mg by mouth every 6 (six) hours as needed for pain.    Historical Markeia Harkless, MD  albuterol (PROVENTIL HFA;VENTOLIN HFA) 108 (90 BASE) MCG/ACT inhaler Inhale 1-2 puffs into the lungs every 6 (six) hours as needed for wheezing or shortness of breath. 10/06/13   Linton Flemings, MD  ambrisentan (LETAIRIS) 5 MG tablet Take 1 tablet (5 mg total) by mouth daily. 06/04/14   Juanito Doom, MD  amLODipine (NORVASC) 10 MG tablet Take 10 mg by mouth at bedtime.     Historical Emarie Paul, MD  aspirin EC 81 MG tablet Take 81 mg by mouth daily.    Historical Roddy Bellamy, MD  carvedilol (COREG) 25 MG tablet Take 25 mg by mouth 2 (two) times daily with a meal.    Historical Alize Acy, MD  hydrALAZINE (APRESOLINE) 50 MG tablet Take 50 mg by mouth 3 (three) times daily.    Historical Emersyn Kotarski, MD  hydrOXYzine  (ATARAX/VISTARIL) 25 MG tablet Take 1 tablet by mouth daily as needed. On dialysis days 03/18/14   Historical Aidyn Sportsman, MD  isosorbide mononitrate (IMDUR) 30 MG 24 hr tablet TAKE 1 TABLET (30 MG TOTAL) BY MOUTH DAILY. 06/18/14   Minus Breeding, MD  levETIRAcetam (KEPPRA) 500 MG tablet Take 500 mg by mouth daily at 8 pm.    Historical Zaiya Annunziato, MD  levothyroxine (SYNTHROID, LEVOTHROID) 88 MCG tablet Take 88 mcg by mouth daily before breakfast.    Historical Zana Biancardi, MD  multivitamin (RENA-VIT) TABS tablet Take 1 tablet by mouth daily.    Historical Baker Kogler, MD  NITROSTAT 0.4 MG SL tablet Place 0.4 mg under the tongue every 5 (five) minutes as  needed for chest pain.  09/16/10   Historical Johncharles Fusselman, MD  omeprazole (PRILOSEC OTC) 20 MG tablet Take 20 mg by mouth daily.    Historical Abbegale Stehle, MD  ondansetron (ZOFRAN-ODT) 8 MG disintegrating tablet Take 0.5 tablets (4 mg total) by mouth every 8 (eight) hours as needed for nausea or vomiting. 09/23/14   Davonna Belling, MD  pravastatin (PRAVACHOL) 40 MG tablet Take 40 mg by mouth daily.    Historical Abed Schar, MD  SENSIPAR 90 MG tablet Take 1 tablet by mouth daily. 03/05/14   Historical Tzivia Oneil, MD  sevelamer carbonate (RENVELA) 800 MG tablet Take 1,600-3,200 mg by mouth 3 (three) times daily. 4 tabs TID, 2 tabs with each snack    Historical Lonnel Gjerde, MD  traMADol (ULTRAM) 50 MG tablet Take 1 tablet by mouth daily as needed. 04/05/14   Historical Donn Wilmot, MD  traZODone (DESYREL) 50 MG tablet Take 50 mg by mouth at bedtime as needed for sleep.     Historical Latricia Cerrito, MD   BP 184/72 mmHg  Pulse 77  Temp(Src) 98.5 F (36.9 C) (Oral)  Resp 18  SpO2 95% Physical Exam  Constitutional: She appears well-developed.  HENT:  Head: Atraumatic.  Neck: Neck supple.  Cardiovascular: Normal rate.   Pulmonary/Chest: No respiratory distress.  Abdominal: Soft.  Musculoskeletal: Normal range of motion.  Neurological: She is alert.  Skin: Skin is warm.   Psychiatric: She has a normal mood and affect.    ED Course  Procedures (including critical care time) Labs Review Labs Reviewed  BASIC METABOLIC PANEL - Abnormal; Notable for the following:    Potassium 5.5 (*)    Chloride 97 (*)    BUN 30 (*)    Creatinine, Ser 9.12 (*)    GFR calc non Af Amer 4 (*)    GFR calc Af Amer 4 (*)    All other components within normal limits  CBC WITH DIFFERENTIAL/PLATELET - Abnormal; Notable for the following:    Hemoglobin 11.2 (*)    HCT 34.9 (*)    All other components within normal limits    Imaging Review No results found. I have personally reviewed and evaluated these images and lab results as part of my medical decision-making.   EKG Interpretation   Date/Time:  Wednesday September 23 2014 07:20:48 EDT Ventricular Rate:  69 PR Interval:  163 QRS Duration: 92 QT Interval:  443 QTC Calculation: 475 R Axis:   -37 Text Interpretation:  Sinus rhythm Left axis deviation Low voltage,  extremity leads Consider anterior infarct Baseline wander in lead(s) II  III aVF t waves less peaked than previous Confirmed by PICKERING  MD,  NATHAN [phone]) on 09/23/2014 7:30:46 AM      MDM   Final diagnoses:  Nausea vomiting and diarrhea  ESRD (end stage renal disease)    Patient nausea vomiting diarrhea. Also end-stage renal disease. Feels better after treatment. Will follow with dialysis.    Davonna Belling, MD 09/25/14 1500

## 2014-09-23 NOTE — ED Notes (Signed)
MD at bedside. 

## 2014-09-24 DIAGNOSIS — D509 Iron deficiency anemia, unspecified: Secondary | ICD-10-CM | POA: Diagnosis not present

## 2014-09-24 DIAGNOSIS — E119 Type 2 diabetes mellitus without complications: Secondary | ICD-10-CM | POA: Diagnosis not present

## 2014-09-24 DIAGNOSIS — N186 End stage renal disease: Secondary | ICD-10-CM | POA: Diagnosis not present

## 2014-09-24 DIAGNOSIS — D631 Anemia in chronic kidney disease: Secondary | ICD-10-CM | POA: Diagnosis not present

## 2014-09-24 DIAGNOSIS — N2581 Secondary hyperparathyroidism of renal origin: Secondary | ICD-10-CM | POA: Diagnosis not present

## 2014-09-28 DIAGNOSIS — N2581 Secondary hyperparathyroidism of renal origin: Secondary | ICD-10-CM | POA: Diagnosis not present

## 2014-09-28 DIAGNOSIS — D509 Iron deficiency anemia, unspecified: Secondary | ICD-10-CM | POA: Diagnosis not present

## 2014-09-28 DIAGNOSIS — N186 End stage renal disease: Secondary | ICD-10-CM | POA: Diagnosis not present

## 2014-09-28 DIAGNOSIS — D631 Anemia in chronic kidney disease: Secondary | ICD-10-CM | POA: Diagnosis not present

## 2014-09-28 DIAGNOSIS — E119 Type 2 diabetes mellitus without complications: Secondary | ICD-10-CM | POA: Diagnosis not present

## 2014-09-30 ENCOUNTER — Telehealth: Payer: Self-pay | Admitting: Pulmonary Disease

## 2014-09-30 DIAGNOSIS — D509 Iron deficiency anemia, unspecified: Secondary | ICD-10-CM | POA: Diagnosis not present

## 2014-09-30 DIAGNOSIS — E119 Type 2 diabetes mellitus without complications: Secondary | ICD-10-CM | POA: Diagnosis not present

## 2014-09-30 DIAGNOSIS — D631 Anemia in chronic kidney disease: Secondary | ICD-10-CM | POA: Diagnosis not present

## 2014-09-30 DIAGNOSIS — N186 End stage renal disease: Secondary | ICD-10-CM | POA: Diagnosis not present

## 2014-09-30 DIAGNOSIS — N2581 Secondary hyperparathyroidism of renal origin: Secondary | ICD-10-CM | POA: Diagnosis not present

## 2014-09-30 NOTE — Telephone Encounter (Signed)
[name] called inquiring about Lab order. Asked [name] if she has form and she has the form and will fax it today [place] notified. Nothing further needed.

## 2014-10-02 DIAGNOSIS — E1129 Type 2 diabetes mellitus with other diabetic kidney complication: Secondary | ICD-10-CM | POA: Diagnosis not present

## 2014-10-02 DIAGNOSIS — Z992 Dependence on renal dialysis: Secondary | ICD-10-CM | POA: Diagnosis not present

## 2014-10-02 DIAGNOSIS — E119 Type 2 diabetes mellitus without complications: Secondary | ICD-10-CM | POA: Diagnosis not present

## 2014-10-02 DIAGNOSIS — N186 End stage renal disease: Secondary | ICD-10-CM | POA: Diagnosis not present

## 2014-10-02 DIAGNOSIS — D631 Anemia in chronic kidney disease: Secondary | ICD-10-CM | POA: Diagnosis not present

## 2014-10-02 DIAGNOSIS — D509 Iron deficiency anemia, unspecified: Secondary | ICD-10-CM | POA: Diagnosis not present

## 2014-10-02 DIAGNOSIS — N2581 Secondary hyperparathyroidism of renal origin: Secondary | ICD-10-CM | POA: Diagnosis not present

## 2014-10-05 DIAGNOSIS — N186 End stage renal disease: Secondary | ICD-10-CM | POA: Diagnosis not present

## 2014-10-05 DIAGNOSIS — D631 Anemia in chronic kidney disease: Secondary | ICD-10-CM | POA: Diagnosis not present

## 2014-10-05 DIAGNOSIS — N2581 Secondary hyperparathyroidism of renal origin: Secondary | ICD-10-CM | POA: Diagnosis not present

## 2014-10-05 DIAGNOSIS — E119 Type 2 diabetes mellitus without complications: Secondary | ICD-10-CM | POA: Diagnosis not present

## 2014-10-05 DIAGNOSIS — I130 Hypertensive heart and chronic kidney disease with heart failure and stage 1 through stage 4 chronic kidney disease, or unspecified chronic kidney disease: Secondary | ICD-10-CM | POA: Diagnosis not present

## 2014-10-05 DIAGNOSIS — Z23 Encounter for immunization: Secondary | ICD-10-CM | POA: Diagnosis not present

## 2014-10-05 DIAGNOSIS — E875 Hyperkalemia: Secondary | ICD-10-CM | POA: Diagnosis not present

## 2014-10-05 DIAGNOSIS — I5032 Chronic diastolic (congestive) heart failure: Secondary | ICD-10-CM | POA: Diagnosis not present

## 2014-10-05 DIAGNOSIS — D509 Iron deficiency anemia, unspecified: Secondary | ICD-10-CM | POA: Diagnosis not present

## 2014-10-06 DIAGNOSIS — K580 Irritable bowel syndrome with diarrhea: Secondary | ICD-10-CM | POA: Diagnosis not present

## 2014-10-06 DIAGNOSIS — R112 Nausea with vomiting, unspecified: Secondary | ICD-10-CM | POA: Diagnosis not present

## 2014-10-06 DIAGNOSIS — R140 Abdominal distension (gaseous): Secondary | ICD-10-CM | POA: Diagnosis not present

## 2014-10-07 DIAGNOSIS — N2581 Secondary hyperparathyroidism of renal origin: Secondary | ICD-10-CM | POA: Diagnosis not present

## 2014-10-07 DIAGNOSIS — D631 Anemia in chronic kidney disease: Secondary | ICD-10-CM | POA: Diagnosis not present

## 2014-10-07 DIAGNOSIS — E119 Type 2 diabetes mellitus without complications: Secondary | ICD-10-CM | POA: Diagnosis not present

## 2014-10-07 DIAGNOSIS — N186 End stage renal disease: Secondary | ICD-10-CM | POA: Diagnosis not present

## 2014-10-07 DIAGNOSIS — D509 Iron deficiency anemia, unspecified: Secondary | ICD-10-CM | POA: Diagnosis not present

## 2014-10-07 DIAGNOSIS — Z23 Encounter for immunization: Secondary | ICD-10-CM | POA: Diagnosis not present

## 2014-10-09 DIAGNOSIS — Z23 Encounter for immunization: Secondary | ICD-10-CM | POA: Diagnosis not present

## 2014-10-09 DIAGNOSIS — D509 Iron deficiency anemia, unspecified: Secondary | ICD-10-CM | POA: Diagnosis not present

## 2014-10-09 DIAGNOSIS — N186 End stage renal disease: Secondary | ICD-10-CM | POA: Diagnosis not present

## 2014-10-09 DIAGNOSIS — D631 Anemia in chronic kidney disease: Secondary | ICD-10-CM | POA: Diagnosis not present

## 2014-10-09 DIAGNOSIS — E119 Type 2 diabetes mellitus without complications: Secondary | ICD-10-CM | POA: Diagnosis not present

## 2014-10-09 DIAGNOSIS — N2581 Secondary hyperparathyroidism of renal origin: Secondary | ICD-10-CM | POA: Diagnosis not present

## 2014-10-12 DIAGNOSIS — E119 Type 2 diabetes mellitus without complications: Secondary | ICD-10-CM | POA: Diagnosis not present

## 2014-10-12 DIAGNOSIS — D509 Iron deficiency anemia, unspecified: Secondary | ICD-10-CM | POA: Diagnosis not present

## 2014-10-12 DIAGNOSIS — Z23 Encounter for immunization: Secondary | ICD-10-CM | POA: Diagnosis not present

## 2014-10-12 DIAGNOSIS — N2581 Secondary hyperparathyroidism of renal origin: Secondary | ICD-10-CM | POA: Diagnosis not present

## 2014-10-12 DIAGNOSIS — D631 Anemia in chronic kidney disease: Secondary | ICD-10-CM | POA: Diagnosis not present

## 2014-10-12 DIAGNOSIS — N186 End stage renal disease: Secondary | ICD-10-CM | POA: Diagnosis not present

## 2014-10-14 DIAGNOSIS — Z23 Encounter for immunization: Secondary | ICD-10-CM | POA: Diagnosis not present

## 2014-10-14 DIAGNOSIS — N2581 Secondary hyperparathyroidism of renal origin: Secondary | ICD-10-CM | POA: Diagnosis not present

## 2014-10-14 DIAGNOSIS — D509 Iron deficiency anemia, unspecified: Secondary | ICD-10-CM | POA: Diagnosis not present

## 2014-10-14 DIAGNOSIS — E119 Type 2 diabetes mellitus without complications: Secondary | ICD-10-CM | POA: Diagnosis not present

## 2014-10-14 DIAGNOSIS — D631 Anemia in chronic kidney disease: Secondary | ICD-10-CM | POA: Diagnosis not present

## 2014-10-14 DIAGNOSIS — N186 End stage renal disease: Secondary | ICD-10-CM | POA: Diagnosis not present

## 2014-10-16 DIAGNOSIS — D631 Anemia in chronic kidney disease: Secondary | ICD-10-CM | POA: Diagnosis not present

## 2014-10-16 DIAGNOSIS — N2581 Secondary hyperparathyroidism of renal origin: Secondary | ICD-10-CM | POA: Diagnosis not present

## 2014-10-16 DIAGNOSIS — Z23 Encounter for immunization: Secondary | ICD-10-CM | POA: Diagnosis not present

## 2014-10-16 DIAGNOSIS — N186 End stage renal disease: Secondary | ICD-10-CM | POA: Diagnosis not present

## 2014-10-16 DIAGNOSIS — D509 Iron deficiency anemia, unspecified: Secondary | ICD-10-CM | POA: Diagnosis not present

## 2014-10-16 DIAGNOSIS — E119 Type 2 diabetes mellitus without complications: Secondary | ICD-10-CM | POA: Diagnosis not present

## 2014-10-19 DIAGNOSIS — D509 Iron deficiency anemia, unspecified: Secondary | ICD-10-CM | POA: Diagnosis not present

## 2014-10-19 DIAGNOSIS — E119 Type 2 diabetes mellitus without complications: Secondary | ICD-10-CM | POA: Diagnosis not present

## 2014-10-19 DIAGNOSIS — N2581 Secondary hyperparathyroidism of renal origin: Secondary | ICD-10-CM | POA: Diagnosis not present

## 2014-10-19 DIAGNOSIS — Z23 Encounter for immunization: Secondary | ICD-10-CM | POA: Diagnosis not present

## 2014-10-19 DIAGNOSIS — N186 End stage renal disease: Secondary | ICD-10-CM | POA: Diagnosis not present

## 2014-10-19 DIAGNOSIS — D631 Anemia in chronic kidney disease: Secondary | ICD-10-CM | POA: Diagnosis not present

## 2014-10-21 DIAGNOSIS — E119 Type 2 diabetes mellitus without complications: Secondary | ICD-10-CM | POA: Diagnosis not present

## 2014-10-21 DIAGNOSIS — Z23 Encounter for immunization: Secondary | ICD-10-CM | POA: Diagnosis not present

## 2014-10-21 DIAGNOSIS — D509 Iron deficiency anemia, unspecified: Secondary | ICD-10-CM | POA: Diagnosis not present

## 2014-10-21 DIAGNOSIS — D631 Anemia in chronic kidney disease: Secondary | ICD-10-CM | POA: Diagnosis not present

## 2014-10-21 DIAGNOSIS — N2581 Secondary hyperparathyroidism of renal origin: Secondary | ICD-10-CM | POA: Diagnosis not present

## 2014-10-21 DIAGNOSIS — N186 End stage renal disease: Secondary | ICD-10-CM | POA: Diagnosis not present

## 2014-10-23 DIAGNOSIS — D631 Anemia in chronic kidney disease: Secondary | ICD-10-CM | POA: Diagnosis not present

## 2014-10-23 DIAGNOSIS — D509 Iron deficiency anemia, unspecified: Secondary | ICD-10-CM | POA: Diagnosis not present

## 2014-10-23 DIAGNOSIS — E119 Type 2 diabetes mellitus without complications: Secondary | ICD-10-CM | POA: Diagnosis not present

## 2014-10-23 DIAGNOSIS — N2581 Secondary hyperparathyroidism of renal origin: Secondary | ICD-10-CM | POA: Diagnosis not present

## 2014-10-23 DIAGNOSIS — Z23 Encounter for immunization: Secondary | ICD-10-CM | POA: Diagnosis not present

## 2014-10-23 DIAGNOSIS — N186 End stage renal disease: Secondary | ICD-10-CM | POA: Diagnosis not present

## 2014-10-26 DIAGNOSIS — Z23 Encounter for immunization: Secondary | ICD-10-CM | POA: Diagnosis not present

## 2014-10-26 DIAGNOSIS — N2581 Secondary hyperparathyroidism of renal origin: Secondary | ICD-10-CM | POA: Diagnosis not present

## 2014-10-26 DIAGNOSIS — D509 Iron deficiency anemia, unspecified: Secondary | ICD-10-CM | POA: Diagnosis not present

## 2014-10-26 DIAGNOSIS — E119 Type 2 diabetes mellitus without complications: Secondary | ICD-10-CM | POA: Diagnosis not present

## 2014-10-26 DIAGNOSIS — D631 Anemia in chronic kidney disease: Secondary | ICD-10-CM | POA: Diagnosis not present

## 2014-10-26 DIAGNOSIS — N186 End stage renal disease: Secondary | ICD-10-CM | POA: Diagnosis not present

## 2014-10-28 DIAGNOSIS — D509 Iron deficiency anemia, unspecified: Secondary | ICD-10-CM | POA: Diagnosis not present

## 2014-10-28 DIAGNOSIS — N186 End stage renal disease: Secondary | ICD-10-CM | POA: Diagnosis not present

## 2014-10-28 DIAGNOSIS — E119 Type 2 diabetes mellitus without complications: Secondary | ICD-10-CM | POA: Diagnosis not present

## 2014-10-28 DIAGNOSIS — D631 Anemia in chronic kidney disease: Secondary | ICD-10-CM | POA: Diagnosis not present

## 2014-10-28 DIAGNOSIS — N2581 Secondary hyperparathyroidism of renal origin: Secondary | ICD-10-CM | POA: Diagnosis not present

## 2014-10-28 DIAGNOSIS — Z23 Encounter for immunization: Secondary | ICD-10-CM | POA: Diagnosis not present

## 2014-10-30 DIAGNOSIS — N2581 Secondary hyperparathyroidism of renal origin: Secondary | ICD-10-CM | POA: Diagnosis not present

## 2014-10-30 DIAGNOSIS — E119 Type 2 diabetes mellitus without complications: Secondary | ICD-10-CM | POA: Diagnosis not present

## 2014-10-30 DIAGNOSIS — D509 Iron deficiency anemia, unspecified: Secondary | ICD-10-CM | POA: Diagnosis not present

## 2014-10-30 DIAGNOSIS — D631 Anemia in chronic kidney disease: Secondary | ICD-10-CM | POA: Diagnosis not present

## 2014-10-30 DIAGNOSIS — Z23 Encounter for immunization: Secondary | ICD-10-CM | POA: Diagnosis not present

## 2014-10-30 DIAGNOSIS — N186 End stage renal disease: Secondary | ICD-10-CM | POA: Diagnosis not present

## 2014-11-02 DIAGNOSIS — Z992 Dependence on renal dialysis: Secondary | ICD-10-CM | POA: Diagnosis not present

## 2014-11-02 DIAGNOSIS — E1129 Type 2 diabetes mellitus with other diabetic kidney complication: Secondary | ICD-10-CM | POA: Diagnosis not present

## 2014-11-02 DIAGNOSIS — Z23 Encounter for immunization: Secondary | ICD-10-CM | POA: Diagnosis not present

## 2014-11-02 DIAGNOSIS — D631 Anemia in chronic kidney disease: Secondary | ICD-10-CM | POA: Diagnosis not present

## 2014-11-02 DIAGNOSIS — N2581 Secondary hyperparathyroidism of renal origin: Secondary | ICD-10-CM | POA: Diagnosis not present

## 2014-11-02 DIAGNOSIS — E119 Type 2 diabetes mellitus without complications: Secondary | ICD-10-CM | POA: Diagnosis not present

## 2014-11-02 DIAGNOSIS — N186 End stage renal disease: Secondary | ICD-10-CM | POA: Diagnosis not present

## 2014-11-02 DIAGNOSIS — D509 Iron deficiency anemia, unspecified: Secondary | ICD-10-CM | POA: Diagnosis not present

## 2014-11-04 DIAGNOSIS — N2581 Secondary hyperparathyroidism of renal origin: Secondary | ICD-10-CM | POA: Diagnosis not present

## 2014-11-04 DIAGNOSIS — N186 End stage renal disease: Secondary | ICD-10-CM | POA: Diagnosis not present

## 2014-11-04 DIAGNOSIS — E119 Type 2 diabetes mellitus without complications: Secondary | ICD-10-CM | POA: Diagnosis not present

## 2014-11-04 DIAGNOSIS — D631 Anemia in chronic kidney disease: Secondary | ICD-10-CM | POA: Diagnosis not present

## 2014-11-04 DIAGNOSIS — D509 Iron deficiency anemia, unspecified: Secondary | ICD-10-CM | POA: Diagnosis not present

## 2014-11-04 DIAGNOSIS — D508 Other iron deficiency anemias: Secondary | ICD-10-CM | POA: Diagnosis not present

## 2014-11-06 DIAGNOSIS — N2581 Secondary hyperparathyroidism of renal origin: Secondary | ICD-10-CM | POA: Diagnosis not present

## 2014-11-06 DIAGNOSIS — N186 End stage renal disease: Secondary | ICD-10-CM | POA: Diagnosis not present

## 2014-11-06 DIAGNOSIS — D509 Iron deficiency anemia, unspecified: Secondary | ICD-10-CM | POA: Diagnosis not present

## 2014-11-06 DIAGNOSIS — D631 Anemia in chronic kidney disease: Secondary | ICD-10-CM | POA: Diagnosis not present

## 2014-11-06 DIAGNOSIS — E119 Type 2 diabetes mellitus without complications: Secondary | ICD-10-CM | POA: Diagnosis not present

## 2014-11-06 DIAGNOSIS — D508 Other iron deficiency anemias: Secondary | ICD-10-CM | POA: Diagnosis not present

## 2014-11-09 DIAGNOSIS — D509 Iron deficiency anemia, unspecified: Secondary | ICD-10-CM | POA: Diagnosis not present

## 2014-11-09 DIAGNOSIS — N2581 Secondary hyperparathyroidism of renal origin: Secondary | ICD-10-CM | POA: Diagnosis not present

## 2014-11-09 DIAGNOSIS — D631 Anemia in chronic kidney disease: Secondary | ICD-10-CM | POA: Diagnosis not present

## 2014-11-09 DIAGNOSIS — N186 End stage renal disease: Secondary | ICD-10-CM | POA: Diagnosis not present

## 2014-11-09 DIAGNOSIS — E119 Type 2 diabetes mellitus without complications: Secondary | ICD-10-CM | POA: Diagnosis not present

## 2014-11-09 DIAGNOSIS — D508 Other iron deficiency anemias: Secondary | ICD-10-CM | POA: Diagnosis not present

## 2014-11-10 ENCOUNTER — Telehealth: Payer: Self-pay | Admitting: Pulmonary Disease

## 2014-11-10 ENCOUNTER — Other Ambulatory Visit (INDEPENDENT_AMBULATORY_CARE_PROVIDER_SITE_OTHER): Payer: Medicare Other

## 2014-11-10 DIAGNOSIS — Z5181 Encounter for therapeutic drug level monitoring: Secondary | ICD-10-CM

## 2014-11-10 LAB — CBC WITH DIFFERENTIAL/PLATELET
BASOS ABS: 0 10*3/uL (ref 0.0–0.1)
Basophils Relative: 0.3 % (ref 0.0–3.0)
EOS ABS: 0.2 10*3/uL (ref 0.0–0.7)
Eosinophils Relative: 2.5 % (ref 0.0–5.0)
HEMATOCRIT: 40.5 % (ref 36.0–46.0)
HEMOGLOBIN: 12.9 g/dL (ref 12.0–15.0)
LYMPHS PCT: 16.9 % (ref 12.0–46.0)
Lymphs Abs: 1.2 10*3/uL (ref 0.7–4.0)
MCHC: 31.9 g/dL (ref 30.0–36.0)
MCV: 81.5 fl (ref 78.0–100.0)
Monocytes Absolute: 0.4 10*3/uL (ref 0.1–1.0)
Monocytes Relative: 6.1 % (ref 3.0–12.0)
NEUTROS PCT: 74.2 % (ref 43.0–77.0)
Neutro Abs: 5.3 10*3/uL (ref 1.4–7.7)
PLATELETS: 183 10*3/uL (ref 150.0–400.0)
RBC: 4.97 Mil/uL (ref 3.87–5.11)
RDW: 15.9 % — ABNORMAL HIGH (ref 11.5–15.5)
WBC: 7.1 10*3/uL (ref 4.0–10.5)

## 2014-11-10 LAB — HEPATIC FUNCTION PANEL
ALBUMIN: 3.9 g/dL (ref 3.5–5.2)
ALT: 9 U/L (ref 0–35)
AST: 17 U/L (ref 0–37)
Alkaline Phosphatase: 81 U/L (ref 39–117)
Bilirubin, Direct: 0.1 mg/dL (ref 0.0–0.3)
TOTAL PROTEIN: 6.8 g/dL (ref 6.0–8.3)
Total Bilirubin: 0.6 mg/dL (ref 0.2–1.2)

## 2014-11-10 NOTE — Telephone Encounter (Signed)
Per Dr. Lake Bells, ordered CBC and CMP and sent directions to lab to add to bloodwork drawn today Called lab downstairs and advised them that order has been entered for Add on.  Was advised that they drew all the proper tubes that were needed and would not require another blood draw. Patient notified that orders have been sent to the lab and that we do not need any further blood draw. Nothing further needed. Closing encounter

## 2014-11-10 NOTE — Telephone Encounter (Signed)
Patient says she received a letter from [employer] from [employer].  They require patient to have the following tests: ALT, Serum Albumin, AST, Total Bilirubin, CBC w/ platelets. Patient came by the lab today and had blood drawn, but she forgot to bring her letter to get the orders put in.  She said that the lab tech drew extra tubes of blood so we could order these labs.  Dr. Lake Bells, please advise if ok to put in these orders.

## 2014-11-10 NOTE — Telephone Encounter (Signed)
OK by me to order: CBC Comprehensive metabolic panel

## 2014-11-11 ENCOUNTER — Telehealth: Payer: Self-pay | Admitting: Pulmonary Disease

## 2014-11-11 DIAGNOSIS — E119 Type 2 diabetes mellitus without complications: Secondary | ICD-10-CM | POA: Diagnosis not present

## 2014-11-11 DIAGNOSIS — D509 Iron deficiency anemia, unspecified: Secondary | ICD-10-CM | POA: Diagnosis not present

## 2014-11-11 DIAGNOSIS — N2581 Secondary hyperparathyroidism of renal origin: Secondary | ICD-10-CM | POA: Diagnosis not present

## 2014-11-11 DIAGNOSIS — D508 Other iron deficiency anemias: Secondary | ICD-10-CM | POA: Diagnosis not present

## 2014-11-11 DIAGNOSIS — N186 End stage renal disease: Secondary | ICD-10-CM | POA: Diagnosis not present

## 2014-11-11 DIAGNOSIS — D631 Anemia in chronic kidney disease: Secondary | ICD-10-CM | POA: Diagnosis not present

## 2014-11-11 NOTE — Telephone Encounter (Signed)
LM for [name] [id number], regarding patient labs

## 2014-11-12 DIAGNOSIS — D508 Other iron deficiency anemias: Secondary | ICD-10-CM | POA: Diagnosis not present

## 2014-11-12 DIAGNOSIS — N2581 Secondary hyperparathyroidism of renal origin: Secondary | ICD-10-CM | POA: Diagnosis not present

## 2014-11-12 DIAGNOSIS — E119 Type 2 diabetes mellitus without complications: Secondary | ICD-10-CM | POA: Diagnosis not present

## 2014-11-12 DIAGNOSIS — D509 Iron deficiency anemia, unspecified: Secondary | ICD-10-CM | POA: Diagnosis not present

## 2014-11-12 DIAGNOSIS — D631 Anemia in chronic kidney disease: Secondary | ICD-10-CM | POA: Diagnosis not present

## 2014-11-12 DIAGNOSIS — N186 End stage renal disease: Secondary | ICD-10-CM | POA: Diagnosis not present

## 2014-11-12 NOTE — Telephone Encounter (Signed)
ATC Alben Deeds [id number]  [name] out of office until 11/23/14 Unable to leave voicemail. WCB

## 2014-11-12 NOTE — Telephone Encounter (Signed)
Spoke with [name] as [name] is out of office this week Needed to know that the patient has had her monthly labs for her medication.  Advised that both Liver Function and CBC were drawn 11/10/14 Nothing further needed.

## 2014-11-16 DIAGNOSIS — D631 Anemia in chronic kidney disease: Secondary | ICD-10-CM | POA: Diagnosis not present

## 2014-11-16 DIAGNOSIS — N186 End stage renal disease: Secondary | ICD-10-CM | POA: Diagnosis not present

## 2014-11-16 DIAGNOSIS — D508 Other iron deficiency anemias: Secondary | ICD-10-CM | POA: Diagnosis not present

## 2014-11-16 DIAGNOSIS — E119 Type 2 diabetes mellitus without complications: Secondary | ICD-10-CM | POA: Diagnosis not present

## 2014-11-16 DIAGNOSIS — N2581 Secondary hyperparathyroidism of renal origin: Secondary | ICD-10-CM | POA: Diagnosis not present

## 2014-11-16 DIAGNOSIS — D509 Iron deficiency anemia, unspecified: Secondary | ICD-10-CM | POA: Diagnosis not present

## 2014-11-18 DIAGNOSIS — N2581 Secondary hyperparathyroidism of renal origin: Secondary | ICD-10-CM | POA: Diagnosis not present

## 2014-11-18 DIAGNOSIS — D508 Other iron deficiency anemias: Secondary | ICD-10-CM | POA: Diagnosis not present

## 2014-11-18 DIAGNOSIS — E119 Type 2 diabetes mellitus without complications: Secondary | ICD-10-CM | POA: Diagnosis not present

## 2014-11-18 DIAGNOSIS — D509 Iron deficiency anemia, unspecified: Secondary | ICD-10-CM | POA: Diagnosis not present

## 2014-11-18 DIAGNOSIS — N186 End stage renal disease: Secondary | ICD-10-CM | POA: Diagnosis not present

## 2014-11-18 DIAGNOSIS — D631 Anemia in chronic kidney disease: Secondary | ICD-10-CM | POA: Diagnosis not present

## 2014-11-20 ENCOUNTER — Other Ambulatory Visit: Payer: Self-pay | Admitting: *Deleted

## 2014-11-20 DIAGNOSIS — D631 Anemia in chronic kidney disease: Secondary | ICD-10-CM | POA: Diagnosis not present

## 2014-11-20 DIAGNOSIS — N2581 Secondary hyperparathyroidism of renal origin: Secondary | ICD-10-CM | POA: Diagnosis not present

## 2014-11-20 DIAGNOSIS — D508 Other iron deficiency anemias: Secondary | ICD-10-CM | POA: Diagnosis not present

## 2014-11-20 DIAGNOSIS — E119 Type 2 diabetes mellitus without complications: Secondary | ICD-10-CM | POA: Diagnosis not present

## 2014-11-20 DIAGNOSIS — D509 Iron deficiency anemia, unspecified: Secondary | ICD-10-CM | POA: Diagnosis not present

## 2014-11-20 DIAGNOSIS — N186 End stage renal disease: Secondary | ICD-10-CM | POA: Diagnosis not present

## 2014-11-20 DIAGNOSIS — I739 Peripheral vascular disease, unspecified: Secondary | ICD-10-CM

## 2014-11-23 DIAGNOSIS — D508 Other iron deficiency anemias: Secondary | ICD-10-CM | POA: Diagnosis not present

## 2014-11-23 DIAGNOSIS — N186 End stage renal disease: Secondary | ICD-10-CM | POA: Diagnosis not present

## 2014-11-23 DIAGNOSIS — N2581 Secondary hyperparathyroidism of renal origin: Secondary | ICD-10-CM | POA: Diagnosis not present

## 2014-11-23 DIAGNOSIS — D509 Iron deficiency anemia, unspecified: Secondary | ICD-10-CM | POA: Diagnosis not present

## 2014-11-23 DIAGNOSIS — E119 Type 2 diabetes mellitus without complications: Secondary | ICD-10-CM | POA: Diagnosis not present

## 2014-11-23 DIAGNOSIS — D631 Anemia in chronic kidney disease: Secondary | ICD-10-CM | POA: Diagnosis not present

## 2014-11-25 DIAGNOSIS — D509 Iron deficiency anemia, unspecified: Secondary | ICD-10-CM | POA: Diagnosis not present

## 2014-11-25 DIAGNOSIS — D631 Anemia in chronic kidney disease: Secondary | ICD-10-CM | POA: Diagnosis not present

## 2014-11-25 DIAGNOSIS — E119 Type 2 diabetes mellitus without complications: Secondary | ICD-10-CM | POA: Diagnosis not present

## 2014-11-25 DIAGNOSIS — N186 End stage renal disease: Secondary | ICD-10-CM | POA: Diagnosis not present

## 2014-11-25 DIAGNOSIS — D508 Other iron deficiency anemias: Secondary | ICD-10-CM | POA: Diagnosis not present

## 2014-11-25 DIAGNOSIS — N2581 Secondary hyperparathyroidism of renal origin: Secondary | ICD-10-CM | POA: Diagnosis not present

## 2014-11-28 DIAGNOSIS — N186 End stage renal disease: Secondary | ICD-10-CM | POA: Diagnosis not present

## 2014-11-28 DIAGNOSIS — D631 Anemia in chronic kidney disease: Secondary | ICD-10-CM | POA: Diagnosis not present

## 2014-11-28 DIAGNOSIS — D509 Iron deficiency anemia, unspecified: Secondary | ICD-10-CM | POA: Diagnosis not present

## 2014-11-28 DIAGNOSIS — D508 Other iron deficiency anemias: Secondary | ICD-10-CM | POA: Diagnosis not present

## 2014-11-28 DIAGNOSIS — E119 Type 2 diabetes mellitus without complications: Secondary | ICD-10-CM | POA: Diagnosis not present

## 2014-11-28 DIAGNOSIS — N2581 Secondary hyperparathyroidism of renal origin: Secondary | ICD-10-CM | POA: Diagnosis not present

## 2014-11-30 DIAGNOSIS — D509 Iron deficiency anemia, unspecified: Secondary | ICD-10-CM | POA: Diagnosis not present

## 2014-11-30 DIAGNOSIS — E119 Type 2 diabetes mellitus without complications: Secondary | ICD-10-CM | POA: Diagnosis not present

## 2014-11-30 DIAGNOSIS — D631 Anemia in chronic kidney disease: Secondary | ICD-10-CM | POA: Diagnosis not present

## 2014-11-30 DIAGNOSIS — N186 End stage renal disease: Secondary | ICD-10-CM | POA: Diagnosis not present

## 2014-11-30 DIAGNOSIS — D508 Other iron deficiency anemias: Secondary | ICD-10-CM | POA: Diagnosis not present

## 2014-11-30 DIAGNOSIS — N2581 Secondary hyperparathyroidism of renal origin: Secondary | ICD-10-CM | POA: Diagnosis not present

## 2014-12-01 ENCOUNTER — Ambulatory Visit: Payer: Medicare Other | Admitting: Pulmonary Disease

## 2014-12-01 ENCOUNTER — Encounter: Payer: Self-pay | Admitting: Pulmonary Disease

## 2014-12-01 ENCOUNTER — Ambulatory Visit (INDEPENDENT_AMBULATORY_CARE_PROVIDER_SITE_OTHER): Payer: Medicare Other | Admitting: Pulmonary Disease

## 2014-12-01 VITALS — BP 146/88 | HR 71 | Ht 62.5 in | Wt 164.0 lb

## 2014-12-01 DIAGNOSIS — J439 Emphysema, unspecified: Secondary | ICD-10-CM

## 2014-12-01 DIAGNOSIS — I272 Other secondary pulmonary hypertension: Secondary | ICD-10-CM | POA: Diagnosis not present

## 2014-12-01 DIAGNOSIS — I2510 Atherosclerotic heart disease of native coronary artery without angina pectoris: Secondary | ICD-10-CM | POA: Diagnosis not present

## 2014-12-01 NOTE — Patient Instructions (Signed)
Keep taking the Letairis as you're doing We will get a 6 minute walk and blood work on your next visit We will see you back in 3 months or sooner if needed

## 2014-12-01 NOTE — Progress Notes (Signed)
Subjective:    Patient ID: [name], female    DOB: 06-14-1945, 69 y.o.   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  PFTs normal.  Active smoker. Secondary Pulmonary hypertension, ESRD, CHF 04/08/2013 full pulmonary function test> ratio 77%, FEV1 1.44 L (80% predicted), total lung capacity 3.55 (72% predicted), ERV 0.23 L (129% predicted), DLCO 10.95 (47% predicted) 05/2013 Echo> LVEF 35-40% (diffuse global hypokinesis), RV severely dilated, RVSP 154mHg, RA dilated 05/2013 6MW> 240 meters, O2 saturaiton 99% exercise 05/2013 CT chest > no ILD, definite heart failure and CAD 1/26 RHC > RA mean 1 RV 52/1 PA 52/20, mean 30 PCWP mean 6  Oxygen saturations: PA 70% AO 95%  Cardiac Output (Fick) 5.77  Cardiac Index (Fick) 3.32 PVR 4.2 WU  Cardiac Output (Thermo) 5.12 Cardiac Index (Thermo) 2.94 PVR 4.7 WU 03/2014 ANA neg, HIV neg, SCL-70 neg, RNP neg, B2 glycoprotein neg, DS DNA neg 05/2014 6MW> 349m (98% RA)  HPI  Chief Complaint  Patient presents with  . Follow-up    review 60mw.  pt unable to complete 69mw d/t leg pain.  denies any breathing complaints today.    Sakiya has been doing OK Couldn't do 6 min walk because of leg pain Forgot her cane today. Her breathing has been fine lately, not limited in her walking regularly. No cough. She continues to take the [place] every day.  Past Medical History  Diagnosis Date  . CAD (coronary artery disease)     stent to RCA  . CVA (cerebral infarction) 2003    no apparent residual  . Hypothyroidism   . PVD (peripheral vascular disease) ([place])   . Hyperlipidemia   . Positive PPD     completed rifampin  . Diastolic congestive heart failure ([place])   . Hypertension   . COPD (chronic obstructive pulmonary disease) (HCC) t  . Cancer ([place])     clear cell cancer, kidney  . Complication of anesthesia 12/2010    pt is very confused, with AMS with anesthesia  . CVA 07/27/2008    CVA affected  cognition and memory per family, no focal deficits.    . ESRD 07/27/2008    ESRD due to HTN and NSAID's, started hemodialysis in 2005 in [place], [place]. Went to [facility] from 2010 to 2012 and since 2012 has been getting dialysis at [facility] on [address] in [place] on a MWF schedule. First access with RUA AVG placed in [place]. Next and current access was LUA AVG placed by Dr. Lucky Cowboy in [place] in or around 2012. Has had 2 or 3 procedures on that graft since placed per family. She gets her access work done here in [place] now. She is allergic to heparin and does not get any heparin at dialysis; she had an allergic reaction apparently when in ICU in the past.      . Encephalopathy   . Seizures ([place])   . Dyslipidemia   . Chronic renal insufficiency     On hemodialysis  . Arthritis of shoulder     Bilateral  . Depression      Review of Systems  Constitutional: Negative for fever, chills and fatigue.  HENT: Negative for rhinorrhea, sinus pressure and sneezing.   Respiratory: Negative for cough, shortness of breath and wheezing.   Cardiovascular: Negative for chest pain, palpitations and leg swelling.       Objective:   Physical Exam  Filed Vitals:   12/01/14  1507  BP: 146/88  Pulse: 71  Height: 5' 2.5" (1.588 m)  Weight: 164 lb (74.39 kg)  SpO2: 94%   RA  Gen: well appearing HEENT: NCAT, OP clear PULM: CTA B CV: RRR, systolic murmr I/VI RUSB, JVD noted AB: BS+, soft Ext: warm, no edema, clubbing noted Neuro: A&Ox4, maew   05/2013 6MW> 240 meters, O2 saturaiton 99% exercise 01/29/2014 RHC> RA mean 1 RV 52/1 PA 52/20, mean 30 PCWP mean 6  Oxygen saturations: PA 70% AO 95%  Cardiac Output (Fick) 5.77  Cardiac Index (Fick) 3.32 PVR 4.2 WU  Cardiac Output (Thermo) 5.12 Cardiac Index (Thermo) 2.94 PVR 4.7 WU  LFT's from 11/10/2014 normal     Assessment & Plan:   Pulmonary hypertension ([place]) This has been a stable interval for her. She  remains compliant with her medication and her liver function tests have been stable. Unfortunately she could not do a 6 minute walk due to some leg pain today.  Plan: Continue Letairis 37 M walk next visit LFT next visit    Updated Medication List Outpatient Encounter Prescriptions as of 12/01/2014  Medication Sig  . acetaminophen (TYLENOL) 500 MG tablet Take 500 mg by mouth every 6 (six) hours as needed for pain.  [name] albuterol (PROVENTIL HFA;VENTOLIN HFA) 108 (90 BASE) MCG/ACT inhaler Inhale 1-2 puffs into the lungs every 6 (six) hours as needed for wheezing or shortness of breath.  [name] ambrisentan (LETAIRIS) 5 MG tablet Take 1 tablet (5 mg total) by mouth daily.  [name] amLODipine (NORVASC) 10 MG tablet Take 10 mg by mouth at bedtime.   [name] aspirin EC 81 MG tablet Take 81 mg by mouth daily.  . carvedilol (COREG) 25 MG tablet Take 25 mg by mouth 2 (two) times daily with a meal.  . hydrALAZINE (APRESOLINE) 50 MG tablet Take 50 mg by mouth 3 (three) times daily.  . hydrOXYzine (ATARAX/VISTARIL) 25 MG tablet Take 1 tablet by mouth daily as needed. On dialysis days  . isosorbide mononitrate (IMDUR) 30 MG 24 hr tablet TAKE 1 TABLET (30 MG TOTAL) BY MOUTH DAILY.  [name] levETIRAcetam (KEPPRA) 500 MG tablet Take 500 mg by mouth daily at 8 pm.  . levothyroxine (SYNTHROID, LEVOTHROID) 88 MCG tablet Take 88 mcg by mouth daily before breakfast.  . multivitamin (RENA-VIT) TABS tablet Take 1 tablet by mouth daily.  [name] NITROSTAT 0.4 MG SL tablet Place 0.4 mg under the tongue every 5 (five) minutes as needed for chest pain.   [name] omeprazole (PRILOSEC OTC) 20 MG tablet Take 20 mg by mouth daily.  . ondansetron (ZOFRAN-ODT) 8 MG disintegrating tablet Take 0.5 tablets (4 mg total) by mouth every 8 (eight) hours as needed for nausea or vomiting.  . pravastatin (PRAVACHOL) 40 MG tablet Take 40 mg by mouth daily.  . SENSIPAR 90 MG tablet Take 1 tablet by mouth daily.  . sevelamer carbonate (RENVELA) 800 MG tablet Take  1,600-3,200 mg by mouth 3 (three) times daily. 4 tabs TID, 2 tabs with each snack  . traMADol (ULTRAM) 50 MG tablet Take 1 tablet by mouth daily as needed.  . traZODone (DESYREL) 50 MG tablet Take 50 mg by mouth at bedtime as needed for sleep.    No facility-administered encounter medications on file as of 12/01/2014.

## 2014-12-01 NOTE — Assessment & Plan Note (Signed)
This has been a stable interval for her. She remains compliant with her medication and her liver function tests have been stable. Unfortunately she could not do a 6 minute walk due to some leg pain today.  Plan: Continue Letairis 78 M walk next visit LFT next visit

## 2014-12-02 DIAGNOSIS — D509 Iron deficiency anemia, unspecified: Secondary | ICD-10-CM | POA: Diagnosis not present

## 2014-12-02 DIAGNOSIS — N186 End stage renal disease: Secondary | ICD-10-CM | POA: Diagnosis not present

## 2014-12-02 DIAGNOSIS — N2581 Secondary hyperparathyroidism of renal origin: Secondary | ICD-10-CM | POA: Diagnosis not present

## 2014-12-02 DIAGNOSIS — E1129 Type 2 diabetes mellitus with other diabetic kidney complication: Secondary | ICD-10-CM | POA: Diagnosis not present

## 2014-12-02 DIAGNOSIS — E119 Type 2 diabetes mellitus without complications: Secondary | ICD-10-CM | POA: Diagnosis not present

## 2014-12-02 DIAGNOSIS — D508 Other iron deficiency anemias: Secondary | ICD-10-CM | POA: Diagnosis not present

## 2014-12-02 DIAGNOSIS — Z992 Dependence on renal dialysis: Secondary | ICD-10-CM | POA: Diagnosis not present

## 2014-12-02 DIAGNOSIS — D631 Anemia in chronic kidney disease: Secondary | ICD-10-CM | POA: Diagnosis not present

## 2014-12-04 DIAGNOSIS — D508 Other iron deficiency anemias: Secondary | ICD-10-CM | POA: Diagnosis not present

## 2014-12-04 DIAGNOSIS — D631 Anemia in chronic kidney disease: Secondary | ICD-10-CM | POA: Diagnosis not present

## 2014-12-04 DIAGNOSIS — D509 Iron deficiency anemia, unspecified: Secondary | ICD-10-CM | POA: Diagnosis not present

## 2014-12-04 DIAGNOSIS — N186 End stage renal disease: Secondary | ICD-10-CM | POA: Diagnosis not present

## 2014-12-04 DIAGNOSIS — E119 Type 2 diabetes mellitus without complications: Secondary | ICD-10-CM | POA: Diagnosis not present

## 2014-12-04 DIAGNOSIS — N2581 Secondary hyperparathyroidism of renal origin: Secondary | ICD-10-CM | POA: Diagnosis not present

## 2014-12-07 DIAGNOSIS — N186 End stage renal disease: Secondary | ICD-10-CM | POA: Diagnosis not present

## 2014-12-07 DIAGNOSIS — N2581 Secondary hyperparathyroidism of renal origin: Secondary | ICD-10-CM | POA: Diagnosis not present

## 2014-12-07 DIAGNOSIS — E119 Type 2 diabetes mellitus without complications: Secondary | ICD-10-CM | POA: Diagnosis not present

## 2014-12-07 DIAGNOSIS — D509 Iron deficiency anemia, unspecified: Secondary | ICD-10-CM | POA: Diagnosis not present

## 2014-12-07 DIAGNOSIS — D508 Other iron deficiency anemias: Secondary | ICD-10-CM | POA: Diagnosis not present

## 2014-12-07 DIAGNOSIS — D631 Anemia in chronic kidney disease: Secondary | ICD-10-CM | POA: Diagnosis not present

## 2014-12-08 ENCOUNTER — Other Ambulatory Visit: Payer: Self-pay | Admitting: Pulmonary Disease

## 2014-12-09 DIAGNOSIS — N186 End stage renal disease: Secondary | ICD-10-CM | POA: Diagnosis not present

## 2014-12-09 DIAGNOSIS — D509 Iron deficiency anemia, unspecified: Secondary | ICD-10-CM | POA: Diagnosis not present

## 2014-12-09 DIAGNOSIS — D631 Anemia in chronic kidney disease: Secondary | ICD-10-CM | POA: Diagnosis not present

## 2014-12-09 DIAGNOSIS — E119 Type 2 diabetes mellitus without complications: Secondary | ICD-10-CM | POA: Diagnosis not present

## 2014-12-09 DIAGNOSIS — N2581 Secondary hyperparathyroidism of renal origin: Secondary | ICD-10-CM | POA: Diagnosis not present

## 2014-12-09 DIAGNOSIS — D508 Other iron deficiency anemias: Secondary | ICD-10-CM | POA: Diagnosis not present

## 2014-12-11 ENCOUNTER — Telehealth: Payer: Self-pay | Admitting: Vascular Surgery

## 2014-12-11 DIAGNOSIS — E119 Type 2 diabetes mellitus without complications: Secondary | ICD-10-CM | POA: Diagnosis not present

## 2014-12-11 DIAGNOSIS — D508 Other iron deficiency anemias: Secondary | ICD-10-CM | POA: Diagnosis not present

## 2014-12-11 DIAGNOSIS — N2581 Secondary hyperparathyroidism of renal origin: Secondary | ICD-10-CM | POA: Diagnosis not present

## 2014-12-11 DIAGNOSIS — D631 Anemia in chronic kidney disease: Secondary | ICD-10-CM | POA: Diagnosis not present

## 2014-12-11 DIAGNOSIS — N186 End stage renal disease: Secondary | ICD-10-CM | POA: Diagnosis not present

## 2014-12-11 DIAGNOSIS — D509 Iron deficiency anemia, unspecified: Secondary | ICD-10-CM | POA: Diagnosis not present

## 2014-12-11 NOTE — Telephone Encounter (Signed)
LVM for pt with new appt of 12/14- also made Tonya at Dr Mack Guise office aware. dpm

## 2014-12-11 NOTE — Telephone Encounter (Signed)
-----   Message from Gregery Na, RN sent at 12/11/2014  9:21 AM EST ----- Regarding: FW: Appt Please see below. Thanks Hinton Dyer!  ----- Message -----    From: Elam Dutch, MD    Sent: 12/11/2014   8:07 AM      To: Gregery Na, RN Subject: FW: Appt                                       Can you see if anyone has an appt available before 12/22?  Charles ----- Message -----    From: Rexene Agent, MD    Sent: 12/09/2014   8:22 AM      To: Elam Dutch, MD Subject: Appt                                           Hi Charles I follow this pt at the kidney center.  Having quite a bit of claudication pain in RLE, I think PVD, accelerating and hx/o stent on that side.  She doesn't often have concerns but this is getting worse.  I'm wondering if she can get moved up to see you sooner than 12/22.   Thanks [employer] [phone]

## 2014-12-14 ENCOUNTER — Encounter: Payer: Self-pay | Admitting: Vascular Surgery

## 2014-12-14 DIAGNOSIS — N2581 Secondary hyperparathyroidism of renal origin: Secondary | ICD-10-CM | POA: Diagnosis not present

## 2014-12-14 DIAGNOSIS — D509 Iron deficiency anemia, unspecified: Secondary | ICD-10-CM | POA: Diagnosis not present

## 2014-12-14 DIAGNOSIS — D508 Other iron deficiency anemias: Secondary | ICD-10-CM | POA: Diagnosis not present

## 2014-12-14 DIAGNOSIS — E119 Type 2 diabetes mellitus without complications: Secondary | ICD-10-CM | POA: Diagnosis not present

## 2014-12-14 DIAGNOSIS — D631 Anemia in chronic kidney disease: Secondary | ICD-10-CM | POA: Diagnosis not present

## 2014-12-14 DIAGNOSIS — N186 End stage renal disease: Secondary | ICD-10-CM | POA: Diagnosis not present

## 2014-12-15 DIAGNOSIS — D631 Anemia in chronic kidney disease: Secondary | ICD-10-CM | POA: Diagnosis not present

## 2014-12-15 DIAGNOSIS — D509 Iron deficiency anemia, unspecified: Secondary | ICD-10-CM | POA: Diagnosis not present

## 2014-12-15 DIAGNOSIS — D508 Other iron deficiency anemias: Secondary | ICD-10-CM | POA: Diagnosis not present

## 2014-12-15 DIAGNOSIS — N2581 Secondary hyperparathyroidism of renal origin: Secondary | ICD-10-CM | POA: Diagnosis not present

## 2014-12-15 DIAGNOSIS — E119 Type 2 diabetes mellitus without complications: Secondary | ICD-10-CM | POA: Diagnosis not present

## 2014-12-15 DIAGNOSIS — N186 End stage renal disease: Secondary | ICD-10-CM | POA: Diagnosis not present

## 2014-12-16 ENCOUNTER — Ambulatory Visit (INDEPENDENT_AMBULATORY_CARE_PROVIDER_SITE_OTHER): Payer: Medicare Other | Admitting: Vascular Surgery

## 2014-12-16 ENCOUNTER — Encounter: Payer: Self-pay | Admitting: Vascular Surgery

## 2014-12-16 ENCOUNTER — Other Ambulatory Visit: Payer: Self-pay

## 2014-12-16 ENCOUNTER — Ambulatory Visit (HOSPITAL_COMMUNITY)
Admission: RE | Admit: 2014-12-16 | Discharge: 2014-12-16 | Disposition: A | Discharge status: Home / Self Care | Payer: Medicare Other | Source: Ambulatory Visit | Attending: Vascular Surgery | Admitting: Vascular Surgery

## 2014-12-16 VITALS — BP 180/81 | HR 64 | Temp 97.9°F | Resp 16 | Ht 62.0 in | Wt 163.0 lb

## 2014-12-16 DIAGNOSIS — E785 Hyperlipidemia, unspecified: Secondary | ICD-10-CM | POA: Diagnosis not present

## 2014-12-16 DIAGNOSIS — I739 Peripheral vascular disease, unspecified: Secondary | ICD-10-CM | POA: Diagnosis not present

## 2014-12-16 DIAGNOSIS — I70219 Atherosclerosis of native arteries of extremities with intermittent claudication, unspecified extremity: Secondary | ICD-10-CM | POA: Insufficient documentation

## 2014-12-16 DIAGNOSIS — N186 End stage renal disease: Secondary | ICD-10-CM | POA: Diagnosis not present

## 2014-12-16 DIAGNOSIS — I120 Hypertensive chronic kidney disease with stage 5 chronic kidney disease or end stage renal disease: Secondary | ICD-10-CM | POA: Diagnosis not present

## 2014-12-16 DIAGNOSIS — I70213 Atherosclerosis of native arteries of extremities with intermittent claudication, bilateral legs: Secondary | ICD-10-CM

## 2014-12-16 DIAGNOSIS — Z992 Dependence on renal dialysis: Secondary | ICD-10-CM | POA: Insufficient documentation

## 2014-12-16 DIAGNOSIS — Z91041 Radiographic dye allergy status: Secondary | ICD-10-CM

## 2014-12-16 MED ORDER — PREDNISONE 50 MG PO TABS: ORAL_TABLET | ORAL | Status: DC

## 2014-12-16 MED ORDER — DIPHENHYDRAMINE HCL 50 MG PO CAPS: ORAL_CAPSULE | ORAL | Status: DC

## 2014-12-16 NOTE — Progress Notes (Addendum)
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  Patient had recurrence of claudication years ago but only over the last few months as it interfered with ADL.  She notes that ambulation with <25 feet results in pain R>L.  Pain is described as cramping to sharp in character, severity 1-5/10, and associated with ambulation.  Patient has attempted to treat this pain with rest.  The patient has no rest pain symptoms also and no leg wounds/ulcers.  Atherosclerotic risk factors include: HLD, HTN, ESRD-HD, and active smoker.  The patient has had R GSV harvest for ?DRIL for L arm steal.   Past Medical History  Diagnosis Date  . CAD (coronary artery disease)     stent to RCA  . CVA (cerebral infarction) 2003    no apparent residual  . Hypothyroidism   . PVD (peripheral vascular disease) ([place])   . Hyperlipidemia   . Positive PPD     completed rifampin  . Diastolic congestive heart failure ([place])   . Hypertension   . COPD (chronic obstructive pulmonary disease) (HCC) t  . Cancer ([place])     clear cell cancer, kidney  . Complication of anesthesia 12/2010    pt is very confused, with AMS with anesthesia  . CVA 07/27/2008    CVA affected cognition and memory per family, no focal deficits.    . ESRD 07/27/2008    ESRD due to HTN and NSAID's, started hemodialysis in 2005 in [place], [place]. Went to [facility] from 2010 to 2012 and since 2012 has been getting dialysis at [facility] on [address] in [place] on a MWF schedule. First access with RUA AVG placed in [place]. Next and current access was LUA AVG placed by Dr. Lucky Cowboy in [place] in or around 2012. Has had 2 or 3 procedures on that graft since placed per family. She gets her  access work done here in [place] now. She is allergic to heparin and does not get any heparin at dialysis; she had an allergic reaction apparently when in ICU in the past.      . Encephalopathy   . Seizures ([place])   . Dyslipidemia   . Chronic renal insufficiency     On hemodialysis  . Arthritis of shoulder     Bilateral  . Depression     Past Surgical History  Procedure Laterality Date  . Abdominal hysterectomy    . Cholecystectomy    . D&cs    . Right ankle repair    . Left nephrectomy    . Vascular surgery  11/2010    graft inserted to left arm  . Nephrectomy Left     Malignant tumor  . Cataract extraction Bilateral   . Tonsillectomy    . Cardiac catheterization    . Left heart catheterization with coronary angiogram N/A 02/22/2011    Procedure: LEFT HEART CATHETERIZATION WITH CORONARY ANGIOGRAM;  Surgeon: Wellington Hampshire, MD;  Location: [facility] CATH LAB;  Service: Cardiovascular;  Laterality: N/A;  . Right heart catheterization N/A 01/29/2014    Procedure: RIGHT HEART CATH;  Surgeon: Larey Dresser, MD;  Location: [facility] CATH LAB;  Service: Cardiovascular;  Laterality: N/A;    Social History   Social History  .  Marital Status: Divorced    Spouse Name: N/A  . Number of Children: 2  . Years of Education: 16   Occupational History  . Retired Marine scientist   .     Social History Main Topics  . Smoking status: Current Every Day Smoker -- 1.00 packs/day for 53 years    Types: Cigarettes  . Smokeless tobacco: Never Used     Comment: smoking .25ppd 05/26/14.  [name] Alcohol Use: 0.0 oz/week    0 Standard drinks or equivalent per week     Comment: very rarely per pt  . Drug Use: No     Comment: former marijuana use, several years  . Sexual Activity: Not Currently    Birth Control/ Protection: Post-menopausal   Other Topics Concern  . Not on file   Social History Narrative    Family History  Problem Relation Age of Onset  . Heart disease Father   . Hypertension Mother   .  Dementia Mother   . Coronary artery disease Sister   . Heart attack Sister   . Hypertension Brother     Current Outpatient Prescriptions  Medication Sig Dispense Refill  . acetaminophen (TYLENOL) 500 MG tablet Take 500 mg by mouth every 6 (six) hours as needed for pain.    [name] albuterol (PROVENTIL HFA;VENTOLIN HFA) 108 (90 BASE) MCG/ACT inhaler Inhale 1-2 puffs into the lungs every 6 (six) hours as needed for wheezing or shortness of breath. 1 Inhaler 0  . amLODipine (NORVASC) 10 MG tablet Take 10 mg by mouth at bedtime.     [name] aspirin EC 81 MG tablet Take 81 mg by mouth daily.    . carvedilol (COREG) 25 MG tablet Take 25 mg by mouth 2 (two) times daily with a meal.    . hydrALAZINE (APRESOLINE) 50 MG tablet Take 50 mg by mouth 3 (three) times daily.    . isosorbide mononitrate (IMDUR) 30 MG 24 hr tablet TAKE 1 TABLET (30 MG TOTAL) BY MOUTH DAILY. 30 tablet 10  . LETAIRIS 5 MG tablet TAKE 1 TABLET (5 MG) ORALLY DAILY. DO NOT HANDLE IF PREGNANT. DO NOT SPLIT, CRUSH OR CHEW. AVOID INHALATION AND CONTACT WITH SKIN OR EYE. RE 30 tablet 2  . levETIRAcetam (KEPPRA) 500 MG tablet Take 500 mg by mouth daily at 8 pm.    . levothyroxine (SYNTHROID, LEVOTHROID) 88 MCG tablet Take 88 mcg by mouth daily before breakfast.    . multivitamin (RENA-VIT) TABS tablet Take 1 tablet by mouth daily.    [name] NITROSTAT 0.4 MG SL tablet Place 0.4 mg under the tongue every 5 (five) minutes as needed for chest pain.     [name] omeprazole (PRILOSEC OTC) 20 MG tablet Take 20 mg by mouth daily.    . ondansetron (ZOFRAN-ODT) 8 MG disintegrating tablet Take 0.5 tablets (4 mg total) by mouth every 8 (eight) hours as needed for nausea or vomiting. 10 tablet 0  . pravastatin (PRAVACHOL) 40 MG tablet Take 40 mg by mouth daily.    . SENSIPAR 90 MG tablet Take 1 tablet by mouth daily.    . sevelamer carbonate (RENVELA) 800 MG tablet Take 1,600-3,200 mg by mouth 3 (three) times daily. 4 tabs TID, 2 tabs with each snack    . traMADol  (ULTRAM) 50 MG tablet Take 1 tablet by mouth daily as needed.    . traZODone (DESYREL) 50 MG tablet Take 50 mg by mouth at bedtime as needed for sleep.     . hydrOXYzine (ATARAX/VISTARIL)  25 MG tablet Take 1 tablet by mouth daily as needed. Reported on 12/16/2014  6   No current facility-administered medications for this visit.    Allergies  Allergen Reactions  . Heparin Other (See Comments)    MDs told her not to take after reaction in ICU  . Iohexol Swelling and Other (See Comments)    1970s; passed out and had facial/tongue swelling.  Requires 13-hour prep with prednisone and benadryl  . Phenytoin Other (See Comments)    Had reaction while in ICU; doesn't know.  MDs told her not to take ever again.  . Wellbutrin [Bupropion] Other (See Comments)    seizures  . Ampicillin Hives and Rash  . Meperidine Hcl Swelling and Rash    Makes tongue swell  . Morphine Rash  . Penicillins Rash  . Valproic Acid And Related Other (See Comments)    Confusion   . Ace Inhibitors Rash  . Pentazocine Lactate Other (See Comments)    Patient does not remember reaction to this med ([place]).      REVIEW OF SYSTEMS:  (Positives checked otherwise negative)  CARDIOVASCULAR:   [ ]  chest pain,  [ ]  chest pressure,  [ ]  palpitations,  [ ]  shortness of breath when laying flat,  [ ]  shortness of breath with exertion,   [x]  pain in feet when walking,  [ ]  pain in feet when laying flat, [ ]  history of blood clot in veins (DVT),  [ ]  history of phlebitis,  [ ]  swelling in legs,  [ ]  varicose veins  PULMONARY:   [ ]  productive cough,  [ ]  asthma,  [ ]  wheezing  NEUROLOGIC:   [ ]  weakness in arms or legs,  [ ]  numbness in arms or legs,  [ ]  difficulty speaking or slurred speech,  [ ]  temporary loss of vision in one eye,  [ ]  dizziness  HEMATOLOGIC:   [ ]  bleeding problems,  [ ]  problems with blood clotting too easily  MUSCULOSKEL:   [ ]  joint pain, [ ]  joint swelling  GASTROINTEST:   [  ] vomiting blood,  [ ]  blood in stool     GENITOURINARY:   [ ]  burning with urination,  [ ]  blood in urine  PSYCHIATRIC:   [ ]  history of major depression  INTEGUMENTARY:   [ ]  rashes,  [ ]  ulcers  CONSTITUTIONAL:   [ ]  fever,  [ ]  chills   For VQI Use Only   PRE-ADM LIVING: Home  AMB STATUS: Ambulatory  CAD Sx: None  PRIOR CHF: None  STRESS TEST: [x]  No, [ ]  Normal, [ ]  + ischemia, [ ]  + MI, [ ]  Both   Physical Examination  Filed Vitals:   12/16/14 1122 12/16/14 1125  BP: 169/76 180/81  Pulse: 64 64  Temp: 97.9 F (36.6 C)   Resp: 16   Height: 5\' 2"  (1.575 m)   Weight: 163 lb (73.936 kg)   SpO2: 95%    Body mass index is 29.81 kg/(m^2).  General: A&O x 3, WDWN  Head: [place]/AT  Ear/Nose/Throat: Hearing grossly intact, nares without erythema or drainage, oropharynx without Erythema/Exudate, Mallampati score: 3  Eyes: PERRLA, EOMI  Neck: Supple, no nuchal rigidity, no palpable LAD  Pulmonary: Sym exp, good air movt, CTAB, no rales, rhonchi, & wheezing  Cardiac: RRR, Nl S1, S2, no Murmurs, rubs or gallops  Vascular: Vessel Right Left  Radial Palpable Not Palpable  Brachial Palpable Palpable  Carotid Palpable, without bruit  Palpable, without bruit  Aorta Not palpable N/A  Femoral Palpable Palpable  Popliteal Not palpable Not palpable  PT Not Palpable Not Palpable  DP Not Palpable Not Palpable   Gastrointestinal: soft, NTND, no G/R, no HSM, no masses, no CVAT B, mid-line incision healed  Musculoskeletal: M/S 5/5 throughout , Extremities without ischemic changes , R medial thigh incision healed, LUA AVG palpable with thrill and bruit  Neurologic: CN grossly intact, Pain and light touch intact in extremities except decreased in feet, Motor exam as listed above  Psychiatric: Judgment intact, Mood & affect appropriate for pt's clinical situation  Dermatologic: See M/S exam for extremity exam, no rashes otherwise noted  Lymph : No Cervical,  Axillary, or Inguinal lymphadenopathy    Non-Invasive Vascular Imaging  ABI (Date: 12/16/2014)  R:   ABI: 0.69,   DP: mono,   PT: mono,   TBI: 0  L:   ABI: 0.89,   DP: mono,   PT: mono,   TBI: 0.54   Outside Studies/Documentation 15 pages of outside documents were reviewed including: outpatient nephrology chart and labs.  Medical Decision Making  [name] is a 69 y.o. female who presents with: R > L lifestyle limiting intermittent claudication s/p prior stenting (likely SFA), history of IV contrast allergy, multiple co-morbidities including CAD, ESRD-HD   Normally, I would consider maximal medical management and walking plan first but this patient has already been doing this for the last few years with worsening of sx.  At this point, I suspect she has multi-level occlusive disease that is now affecting her ADL.  Based on this patient's history and physical exam, I recommend: Aortogram, bilateral leg runoff.  This is scheduled with Dr. Scot Dock on 22 DEC 16.  I doubt she will have any endovascular targets given history of prior bilateral stent placement (sounds like SFA).  I discussed in depth with the patient the nature of atherosclerosis, and emphasized the importance of maximal medical management including strict control of blood pressure, blood glucose, and lipid levels, antiplatelet agent, obtaining regular exercise, and cessation of smoking.    The patient is aware that without maximal medical management the underlying atherosclerotic disease process will progress, limiting the benefit of any interventions.  I discussed in depth with the patient a walking plan and how to execute such. The patient is currently on a statin: Pravachol. The patient is currently on an anti-platelet: ASA.  In event, bypass will be needed, she will need Cardiac consult to clear Anesthesia given prior history of PCI.  Reported she recently saw Dr. Percival Spanish but I don't see the  notes in the EMR.  Thank you for allowing [place] to participate in this patient's care.   Adele Barthel, MD [facility] Office: [phone] Pager: [phone]  12/16/2014, 12:03 PM

## 2014-12-18 DIAGNOSIS — N186 End stage renal disease: Secondary | ICD-10-CM | POA: Diagnosis not present

## 2014-12-18 DIAGNOSIS — D631 Anemia in chronic kidney disease: Secondary | ICD-10-CM | POA: Diagnosis not present

## 2014-12-18 DIAGNOSIS — D508 Other iron deficiency anemias: Secondary | ICD-10-CM | POA: Diagnosis not present

## 2014-12-18 DIAGNOSIS — N2581 Secondary hyperparathyroidism of renal origin: Secondary | ICD-10-CM | POA: Diagnosis not present

## 2014-12-18 DIAGNOSIS — D509 Iron deficiency anemia, unspecified: Secondary | ICD-10-CM | POA: Diagnosis not present

## 2014-12-18 DIAGNOSIS — E119 Type 2 diabetes mellitus without complications: Secondary | ICD-10-CM | POA: Diagnosis not present

## 2014-12-21 DIAGNOSIS — D509 Iron deficiency anemia, unspecified: Secondary | ICD-10-CM | POA: Diagnosis not present

## 2014-12-21 DIAGNOSIS — N2581 Secondary hyperparathyroidism of renal origin: Secondary | ICD-10-CM | POA: Diagnosis not present

## 2014-12-21 DIAGNOSIS — E119 Type 2 diabetes mellitus without complications: Secondary | ICD-10-CM | POA: Diagnosis not present

## 2014-12-21 DIAGNOSIS — D631 Anemia in chronic kidney disease: Secondary | ICD-10-CM | POA: Diagnosis not present

## 2014-12-21 DIAGNOSIS — N186 End stage renal disease: Secondary | ICD-10-CM | POA: Diagnosis not present

## 2014-12-21 DIAGNOSIS — D508 Other iron deficiency anemias: Secondary | ICD-10-CM | POA: Diagnosis not present

## 2014-12-23 DIAGNOSIS — D508 Other iron deficiency anemias: Secondary | ICD-10-CM | POA: Diagnosis not present

## 2014-12-23 DIAGNOSIS — N186 End stage renal disease: Secondary | ICD-10-CM | POA: Diagnosis not present

## 2014-12-23 DIAGNOSIS — E119 Type 2 diabetes mellitus without complications: Secondary | ICD-10-CM | POA: Diagnosis not present

## 2014-12-23 DIAGNOSIS — D509 Iron deficiency anemia, unspecified: Secondary | ICD-10-CM | POA: Diagnosis not present

## 2014-12-23 DIAGNOSIS — N2581 Secondary hyperparathyroidism of renal origin: Secondary | ICD-10-CM | POA: Diagnosis not present

## 2014-12-23 DIAGNOSIS — D631 Anemia in chronic kidney disease: Secondary | ICD-10-CM | POA: Diagnosis not present

## 2014-12-24 ENCOUNTER — Encounter: Payer: Medicare Other | Admitting: Vascular Surgery

## 2014-12-24 ENCOUNTER — Encounter (HOSPITAL_COMMUNITY): Payer: Medicare Other

## 2014-12-25 DIAGNOSIS — D509 Iron deficiency anemia, unspecified: Secondary | ICD-10-CM | POA: Diagnosis not present

## 2014-12-25 DIAGNOSIS — D508 Other iron deficiency anemias: Secondary | ICD-10-CM | POA: Diagnosis not present

## 2014-12-25 DIAGNOSIS — E119 Type 2 diabetes mellitus without complications: Secondary | ICD-10-CM | POA: Diagnosis not present

## 2014-12-25 DIAGNOSIS — N2581 Secondary hyperparathyroidism of renal origin: Secondary | ICD-10-CM | POA: Diagnosis not present

## 2014-12-25 DIAGNOSIS — N186 End stage renal disease: Secondary | ICD-10-CM | POA: Diagnosis not present

## 2014-12-25 DIAGNOSIS — D631 Anemia in chronic kidney disease: Secondary | ICD-10-CM | POA: Diagnosis not present

## 2014-12-28 DIAGNOSIS — N186 End stage renal disease: Secondary | ICD-10-CM | POA: Diagnosis not present

## 2014-12-28 DIAGNOSIS — D508 Other iron deficiency anemias: Secondary | ICD-10-CM | POA: Diagnosis not present

## 2014-12-28 DIAGNOSIS — N2581 Secondary hyperparathyroidism of renal origin: Secondary | ICD-10-CM | POA: Diagnosis not present

## 2014-12-28 DIAGNOSIS — D509 Iron deficiency anemia, unspecified: Secondary | ICD-10-CM | POA: Diagnosis not present

## 2014-12-28 DIAGNOSIS — E119 Type 2 diabetes mellitus without complications: Secondary | ICD-10-CM | POA: Diagnosis not present

## 2014-12-28 DIAGNOSIS — D631 Anemia in chronic kidney disease: Secondary | ICD-10-CM | POA: Diagnosis not present

## 2014-12-30 DIAGNOSIS — N186 End stage renal disease: Secondary | ICD-10-CM | POA: Diagnosis not present

## 2014-12-30 DIAGNOSIS — E119 Type 2 diabetes mellitus without complications: Secondary | ICD-10-CM | POA: Diagnosis not present

## 2014-12-30 DIAGNOSIS — D508 Other iron deficiency anemias: Secondary | ICD-10-CM | POA: Diagnosis not present

## 2014-12-30 DIAGNOSIS — D509 Iron deficiency anemia, unspecified: Secondary | ICD-10-CM | POA: Diagnosis not present

## 2014-12-30 DIAGNOSIS — N2581 Secondary hyperparathyroidism of renal origin: Secondary | ICD-10-CM | POA: Diagnosis not present

## 2014-12-30 DIAGNOSIS — D631 Anemia in chronic kidney disease: Secondary | ICD-10-CM | POA: Diagnosis not present

## 2014-12-31 ENCOUNTER — Encounter (HOSPITAL_COMMUNITY)
Admission: RE | Disposition: A | Discharge status: Home / Self Care | Payer: Self-pay | Source: Ambulatory Visit | Attending: Vascular Surgery

## 2014-12-31 ENCOUNTER — Telehealth: Payer: Self-pay | Admitting: Vascular Surgery

## 2014-12-31 ENCOUNTER — Ambulatory Visit (HOSPITAL_COMMUNITY)
Admission: RE | Admit: 2014-12-31 | Discharge: 2014-12-31 | Disposition: A | Discharge status: Home / Self Care | Payer: Medicare Other | Source: Ambulatory Visit | Attending: Vascular Surgery | Admitting: Vascular Surgery

## 2014-12-31 DIAGNOSIS — F329 Major depressive disorder, single episode, unspecified: Secondary | ICD-10-CM | POA: Insufficient documentation

## 2014-12-31 DIAGNOSIS — J449 Chronic obstructive pulmonary disease, unspecified: Secondary | ICD-10-CM | POA: Diagnosis not present

## 2014-12-31 DIAGNOSIS — N186 End stage renal disease: Secondary | ICD-10-CM | POA: Insufficient documentation

## 2014-12-31 DIAGNOSIS — Z7982 Long term (current) use of aspirin: Secondary | ICD-10-CM | POA: Insufficient documentation

## 2014-12-31 DIAGNOSIS — M1990 Unspecified osteoarthritis, unspecified site: Secondary | ICD-10-CM | POA: Insufficient documentation

## 2014-12-31 DIAGNOSIS — Z8249 Family history of ischemic heart disease and other diseases of the circulatory system: Secondary | ICD-10-CM | POA: Diagnosis not present

## 2014-12-31 DIAGNOSIS — Z8673 Personal history of transient ischemic attack (TIA), and cerebral infarction without residual deficits: Secondary | ICD-10-CM | POA: Insufficient documentation

## 2014-12-31 DIAGNOSIS — Z992 Dependence on renal dialysis: Secondary | ICD-10-CM | POA: Insufficient documentation

## 2014-12-31 DIAGNOSIS — I5032 Chronic diastolic (congestive) heart failure: Secondary | ICD-10-CM | POA: Diagnosis not present

## 2014-12-31 DIAGNOSIS — Z880 Allergy status to penicillin: Secondary | ICD-10-CM | POA: Diagnosis not present

## 2014-12-31 DIAGNOSIS — F17210 Nicotine dependence, cigarettes, uncomplicated: Secondary | ICD-10-CM | POA: Diagnosis not present

## 2014-12-31 DIAGNOSIS — E039 Hypothyroidism, unspecified: Secondary | ICD-10-CM | POA: Diagnosis not present

## 2014-12-31 DIAGNOSIS — I70219 Atherosclerosis of native arteries of extremities with intermittent claudication, unspecified extremity: Secondary | ICD-10-CM | POA: Diagnosis present

## 2014-12-31 DIAGNOSIS — I70213 Atherosclerosis of native arteries of extremities with intermittent claudication, bilateral legs: Secondary | ICD-10-CM | POA: Insufficient documentation

## 2014-12-31 DIAGNOSIS — I2510 Atherosclerotic heart disease of native coronary artery without angina pectoris: Secondary | ICD-10-CM | POA: Diagnosis not present

## 2014-12-31 DIAGNOSIS — I701 Atherosclerosis of renal artery: Secondary | ICD-10-CM | POA: Diagnosis not present

## 2014-12-31 DIAGNOSIS — Z85528 Personal history of other malignant neoplasm of kidney: Secondary | ICD-10-CM | POA: Insufficient documentation

## 2014-12-31 DIAGNOSIS — E785 Hyperlipidemia, unspecified: Secondary | ICD-10-CM | POA: Diagnosis not present

## 2014-12-31 DIAGNOSIS — T82856A Stenosis of peripheral vascular stent, initial encounter: Secondary | ICD-10-CM | POA: Diagnosis not present

## 2014-12-31 DIAGNOSIS — Y812 Prosthetic and other implants, materials and accessory general- and plastic-surgery devices associated with adverse incidents: Secondary | ICD-10-CM | POA: Diagnosis not present

## 2014-12-31 DIAGNOSIS — I132 Hypertensive heart and chronic kidney disease with heart failure and with stage 5 chronic kidney disease, or end stage renal disease: Secondary | ICD-10-CM | POA: Insufficient documentation

## 2014-12-31 DIAGNOSIS — G9340 Encephalopathy, unspecified: Secondary | ICD-10-CM | POA: Insufficient documentation

## 2014-12-31 HISTORY — PX: PERIPHERAL VASCULAR CATHETERIZATION: SHX172C

## 2014-12-31 LAB — POCT I-STAT, CHEM 8
BUN: 11 mg/dL (ref 6–20)
CALCIUM ION: 1.09 mmol/L — AB (ref 1.13–1.30)
Chloride: 95 mmol/L — ABNORMAL LOW (ref 101–111)
Creatinine, Ser: 5 mg/dL — ABNORMAL HIGH (ref 0.44–1.00)
Glucose, Bld: 145 mg/dL — ABNORMAL HIGH (ref 65–99)
HEMATOCRIT: 38 % (ref 36.0–46.0)
HEMOGLOBIN: 12.9 g/dL (ref 12.0–15.0)
Potassium: 3.7 mmol/L (ref 3.5–5.1)
SODIUM: 140 mmol/L (ref 135–145)
TCO2: 34 mmol/L (ref 0–100)

## 2014-12-31 SURGERY — ABDOMINAL AORTOGRAM
Anesthesia: LOCAL

## 2014-12-31 MED ORDER — CARVEDILOL 25 MG PO TABS
25.0000 mg | ORAL_TABLET | ORAL | Status: AC
  Administered 2014-12-31: 25 mg via ORAL
  Filled 2014-12-31: qty 1

## 2014-12-31 MED ORDER — HYDRALAZINE HCL 50 MG PO TABS
50.0000 mg | ORAL_TABLET | ORAL | Status: AC
  Administered 2014-12-31: 50 mg via ORAL
  Filled 2014-12-31: qty 1

## 2014-12-31 MED ORDER — HEPARIN (PORCINE) IN NACL 2-0.9 UNIT/ML-% IJ SOLN
INTRAMUSCULAR | Status: AC
  Filled 2014-12-31: qty 1000

## 2014-12-31 MED ORDER — OXYCODONE-ACETAMINOPHEN 5-325 MG PO TABS: 1.0000 | ORAL_TABLET | Freq: Four times a day (QID) | ORAL | Status: DC | PRN

## 2014-12-31 MED ORDER — SODIUM CHLORIDE 0.9 % IJ SOLN: 3.0000 mL | Freq: Two times a day (BID) | INTRAMUSCULAR | Status: DC

## 2014-12-31 MED ORDER — SODIUM CHLORIDE 0.9 % IV SOLN: 250.0000 mL | INTRAVENOUS | Status: DC | PRN

## 2014-12-31 MED ORDER — MIDAZOLAM HCL 2 MG/2ML IJ SOLN
INTRAMUSCULAR | Status: DC | PRN
  Administered 2014-12-31: 1 mg via INTRAVENOUS

## 2014-12-31 MED ORDER — LIDOCAINE HCL (PF) 1 % IJ SOLN
INTRAMUSCULAR | Status: DC | PRN
  Administered 2014-12-31: 11:00:00

## 2014-12-31 MED ORDER — SODIUM CHLORIDE 0.9 % IJ SOLN: 3.0000 mL | INTRAMUSCULAR | Status: DC | PRN

## 2014-12-31 MED ORDER — ACETAMINOPHEN 325 MG PO TABS: 650.0000 mg | ORAL_TABLET | ORAL | Status: DC | PRN

## 2014-12-31 MED ORDER — MIDAZOLAM HCL 2 MG/2ML IJ SOLN
INTRAMUSCULAR | Status: AC
  Filled 2014-12-31: qty 2

## 2014-12-31 MED ORDER — ISOSORBIDE MONONITRATE ER 30 MG PO TB24
30.0000 mg | ORAL_TABLET | ORAL | Status: AC
  Administered 2014-12-31: 30 mg via ORAL
  Filled 2014-12-31: qty 1

## 2014-12-31 MED ORDER — FENTANYL CITRATE (PF) 100 MCG/2ML IJ SOLN
INTRAMUSCULAR | Status: DC | PRN
  Administered 2014-12-31: 50 ug via INTRAVENOUS

## 2014-12-31 MED ORDER — IODIXANOL 320 MG/ML IV SOLN
INTRAVENOUS | Status: DC | PRN
  Administered 2014-12-31: 90 mL via INTRA_ARTERIAL

## 2014-12-31 MED ORDER — LIDOCAINE HCL (PF) 1 % IJ SOLN
INTRAMUSCULAR | Status: AC
  Filled 2014-12-31: qty 30

## 2014-12-31 MED ORDER — HYDRALAZINE HCL 20 MG/ML IJ SOLN: 10.0000 mg | INTRAMUSCULAR | Status: DC | PRN

## 2014-12-31 MED ORDER — FENTANYL CITRATE (PF) 100 MCG/2ML IJ SOLN
INTRAMUSCULAR | Status: AC
  Filled 2014-12-31: qty 2

## 2014-12-31 SURGICAL SUPPLY — 12 items
CATH OMNI FLUSH 5F 65CM (CATHETERS) ×2 IMPLANT
COVER PRB 48X5XTLSCP FOLD TPE (BAG) ×1 IMPLANT
COVER PROBE 5X48 (BAG) ×1
GUIDEWIRE ANGLED .035X150CM (WIRE) ×2 IMPLANT
KIT MICROINTRODUCER STIFF 5F (SHEATH) ×2 IMPLANT
KIT PV (KITS) ×2 IMPLANT
SHEATH PINNACLE 5F 10CM (SHEATH) ×4 IMPLANT
SYR MEDRAD MARK V 150ML (SYRINGE) ×2 IMPLANT
TRANSDUCER W/STOPCOCK (MISCELLANEOUS) ×2 IMPLANT
TRAY PV CATH (CUSTOM PROCEDURE TRAY) ×2 IMPLANT
WIRE BENTSON .035X145CM (WIRE) ×4 IMPLANT
WIRE TORQFLEX AUST .018X40CM (WIRE) ×2 IMPLANT

## 2014-12-31 NOTE — Interval H&P Note (Signed)
[facility]  History and Physical Update  The patient was interviewed and re-examined.  The patient's previous History and Physical has been reviewed and is unchanged from my consult.  There is no change in the plan of care: aortogram, bilateral leg runoff.  I doubt intervention will be possible.  I discussed with the patient the nature of angiographic procedures, especially the limited patencies of any endovascular intervention.  The patient is aware of that the risks of an angiographic procedure include but are not limited to: bleeding, infection, access site complications, renal failure, embolization, rupture of vessel, dissection, possible need for emergent surgical intervention, possible need for surgical procedures to treat the patient's pathology, anaphylactic reaction to contrast, and stroke and death.  The patient is aware of the risks and agrees to proceed.   Adele Barthel, MD [facility] Office: [phone] Pager: [phone]  12/31/2014, 9:10 AM

## 2014-12-31 NOTE — Progress Notes (Addendum)
Right femoral artery sheath removed and pressure held for 25 minutes. Right DP pulse is there by doppler. Groin level 0 and no apparent complications. Patient verbalizes understanding of post sheath instructions. Bed rest begins at 1225 PM for 4 hours.

## 2014-12-31 NOTE — H&P (View-Only) (Signed)
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  Patient had recurrence of claudication years ago but only over the last few months as it interfered with ADL.  She notes that ambulation with <25 feet results in pain R>L.  Pain is described as cramping to sharp in character, severity 1-5/10, and associated with ambulation.  Patient has attempted to treat this pain with rest.  The patient has no rest pain symptoms also and no leg wounds/ulcers.  Atherosclerotic risk factors include: HLD, HTN, ESRD-HD, and active smoker.  The patient has had R GSV harvest for ?DRIL for L arm steal.   Past Medical History  Diagnosis Date  . CAD (coronary artery disease)     stent to RCA  . CVA (cerebral infarction) 2003    no apparent residual  . Hypothyroidism   . PVD (peripheral vascular disease) ([place])   . Hyperlipidemia   . Positive PPD     completed rifampin  . Diastolic congestive heart failure ([place])   . Hypertension   . COPD (chronic obstructive pulmonary disease) ([facility]) t  . Cancer ([place])     clear cell cancer, kidney  . Complication of anesthesia 12/2010    pt is very confused, with AMS with anesthesia  . CVA 07/27/2008    CVA affected cognition and memory per family, no focal deficits.    . ESRD 07/27/2008    ESRD due to HTN and NSAID's, started hemodialysis in 2005 in [place], [place]. Went to [facility] from 2010 to 2012 and since 2012 has been getting dialysis at [facility] on [address] in [place] on a MWF schedule. First access with RUA AVG placed in [place]. Next and current access was LUA AVG placed by Dr. Lucky Cowboy in [place] in or around 2012. Has had 2 or 3 procedures on that graft since placed per family. She gets her  access work done here in [place] now. She is allergic to heparin and does not get any heparin at dialysis; she had an allergic reaction apparently when in ICU in the past.      . Encephalopathy   . Seizures ([place])   . Dyslipidemia   . Chronic renal insufficiency     On hemodialysis  . Arthritis of shoulder     Bilateral  . Depression     Past Surgical History  Procedure Laterality Date  . Abdominal hysterectomy    . Cholecystectomy    . D&cs    . Right ankle repair    . Left nephrectomy    . Vascular surgery  11/2010    graft inserted to left arm  . Nephrectomy Left     Malignant tumor  . Cataract extraction Bilateral   . Tonsillectomy    . Cardiac catheterization    . Left heart catheterization with coronary angiogram N/A 02/22/2011    Procedure: LEFT HEART CATHETERIZATION WITH CORONARY ANGIOGRAM;  Surgeon: Wellington Hampshire, MD;  Location: [facility] CATH LAB;  Service: Cardiovascular;  Laterality: N/A;  . Right heart catheterization N/A 01/29/2014    Procedure: RIGHT HEART CATH;  Surgeon: Larey Dresser, MD;  Location: [facility] CATH LAB;  Service: Cardiovascular;  Laterality: N/A;    Social History   Social History  .  Marital Status: Divorced    Spouse Name: N/A  . Number of Children: 2  . Years of Education: 16   Occupational History  . Retired Marine scientist   .     Social History Main Topics  . Smoking status: Current Every Day Smoker -- 1.00 packs/day for 53 years    Types: Cigarettes  . Smokeless tobacco: Never Used     Comment: smoking .25ppd 05/26/14.  [name] Alcohol Use: 0.0 oz/week    0 Standard drinks or equivalent per week     Comment: very rarely per pt  . Drug Use: No     Comment: former marijuana use, several years  . Sexual Activity: Not Currently    Birth Control/ Protection: Post-menopausal   Other Topics Concern  . Not on file   Social History Narrative    Family History  Problem Relation Age of Onset  . Heart disease Father   . Hypertension Mother   .  Dementia Mother   . Coronary artery disease Sister   . Heart attack Sister   . Hypertension Brother     Current Outpatient Prescriptions  Medication Sig Dispense Refill  . acetaminophen (TYLENOL) 500 MG tablet Take 500 mg by mouth every 6 (six) hours as needed for pain.    [name] albuterol (PROVENTIL HFA;VENTOLIN HFA) 108 (90 BASE) MCG/ACT inhaler Inhale 1-2 puffs into the lungs every 6 (six) hours as needed for wheezing or shortness of breath. 1 Inhaler 0  . amLODipine (NORVASC) 10 MG tablet Take 10 mg by mouth at bedtime.     [name] aspirin EC 81 MG tablet Take 81 mg by mouth daily.    . carvedilol (COREG) 25 MG tablet Take 25 mg by mouth 2 (two) times daily with a meal.    . hydrALAZINE (APRESOLINE) 50 MG tablet Take 50 mg by mouth 3 (three) times daily.    . isosorbide mononitrate (IMDUR) 30 MG 24 hr tablet TAKE 1 TABLET (30 MG TOTAL) BY MOUTH DAILY. 30 tablet 10  . LETAIRIS 5 MG tablet TAKE 1 TABLET (5 MG) ORALLY DAILY. DO NOT HANDLE IF PREGNANT. DO NOT SPLIT, CRUSH OR CHEW. AVOID INHALATION AND CONTACT WITH SKIN OR EYE. RE 30 tablet 2  . levETIRAcetam (KEPPRA) 500 MG tablet Take 500 mg by mouth daily at 8 pm.    . levothyroxine (SYNTHROID, LEVOTHROID) 88 MCG tablet Take 88 mcg by mouth daily before breakfast.    . multivitamin (RENA-VIT) TABS tablet Take 1 tablet by mouth daily.    [name] NITROSTAT 0.4 MG SL tablet Place 0.4 mg under the tongue every 5 (five) minutes as needed for chest pain.     [name] omeprazole (PRILOSEC OTC) 20 MG tablet Take 20 mg by mouth daily.    . ondansetron (ZOFRAN-ODT) 8 MG disintegrating tablet Take 0.5 tablets (4 mg total) by mouth every 8 (eight) hours as needed for nausea or vomiting. 10 tablet 0  . pravastatin (PRAVACHOL) 40 MG tablet Take 40 mg by mouth daily.    . SENSIPAR 90 MG tablet Take 1 tablet by mouth daily.    . sevelamer carbonate (RENVELA) 800 MG tablet Take 1,600-3,200 mg by mouth 3 (three) times daily. 4 tabs TID, 2 tabs with each snack    . traMADol  (ULTRAM) 50 MG tablet Take 1 tablet by mouth daily as needed.    . traZODone (DESYREL) 50 MG tablet Take 50 mg by mouth at bedtime as needed for sleep.     . hydrOXYzine (ATARAX/VISTARIL)  25 MG tablet Take 1 tablet by mouth daily as needed. Reported on 12/16/2014  6   No current facility-administered medications for this visit.    Allergies  Allergen Reactions  . Heparin Other (See Comments)    MDs told her not to take after reaction in ICU  . Iohexol Swelling and Other (See Comments)    1970s; passed out and had facial/tongue swelling.  Requires 13-hour prep with prednisone and benadryl  . Phenytoin Other (See Comments)    Had reaction while in ICU; doesn't know.  MDs told her not to take ever again.  . Wellbutrin [Bupropion] Other (See Comments)    seizures  . Ampicillin Hives and Rash  . Meperidine Hcl Swelling and Rash    Makes tongue swell  . Morphine Rash  . Penicillins Rash  . Valproic Acid And Related Other (See Comments)    Confusion   . Ace Inhibitors Rash  . Pentazocine Lactate Other (See Comments)    Patient does not remember reaction to this med ([place]).      REVIEW OF SYSTEMS:  (Positives checked otherwise negative)  CARDIOVASCULAR:   [ ]  chest pain,  [ ]  chest pressure,  [ ]  palpitations,  [ ]  shortness of breath when laying flat,  [ ]  shortness of breath with exertion,   [x]  pain in feet when walking,  [ ]  pain in feet when laying flat, [ ]  history of blood clot in veins (DVT),  [ ]  history of phlebitis,  [ ]  swelling in legs,  [ ]  varicose veins  PULMONARY:   [ ]  productive cough,  [ ]  asthma,  [ ]  wheezing  NEUROLOGIC:   [ ]  weakness in arms or legs,  [ ]  numbness in arms or legs,  [ ]  difficulty speaking or slurred speech,  [ ]  temporary loss of vision in one eye,  [ ]  dizziness  HEMATOLOGIC:   [ ]  bleeding problems,  [ ]  problems with blood clotting too easily  MUSCULOSKEL:   [ ]  joint pain, [ ]  joint swelling  GASTROINTEST:   [  ] vomiting blood,  [ ]  blood in stool     GENITOURINARY:   [ ]  burning with urination,  [ ]  blood in urine  PSYCHIATRIC:   [ ]  history of major depression  INTEGUMENTARY:   [ ]  rashes,  [ ]  ulcers  CONSTITUTIONAL:   [ ]  fever,  [ ]  chills   For VQI Use Only   PRE-ADM LIVING: Home  AMB STATUS: Ambulatory  CAD Sx: None  PRIOR CHF: None  STRESS TEST: [x]  No, [ ]  Normal, [ ]  + ischemia, [ ]  + MI, [ ]  Both   Physical Examination  Filed Vitals:   12/16/14 1122 12/16/14 1125  BP: 169/76 180/81  Pulse: 64 64  Temp: 97.9 F (36.6 C)   Resp: 16   Height: 5\' 2"  (1.575 m)   Weight: 163 lb (73.936 kg)   SpO2: 95%    Body mass index is 29.81 kg/(m^2).  General: A&O x 3, WDWN  Head: [place]/AT  Ear/Nose/Throat: Hearing grossly intact, nares without erythema or drainage, oropharynx without Erythema/Exudate, Mallampati score: 3  Eyes: PERRLA, EOMI  Neck: Supple, no nuchal rigidity, no palpable LAD  Pulmonary: Sym exp, good air movt, CTAB, no rales, rhonchi, & wheezing  Cardiac: RRR, Nl S1, S2, no Murmurs, rubs or gallops  Vascular: Vessel Right Left  Radial Palpable Not Palpable  Brachial Palpable Palpable  Carotid Palpable, without bruit  Palpable, without bruit  Aorta Not palpable N/A  Femoral Palpable Palpable  Popliteal Not palpable Not palpable  PT Not Palpable Not Palpable  DP Not Palpable Not Palpable   Gastrointestinal: soft, NTND, no G/R, no HSM, no masses, no CVAT B, mid-line incision healed  Musculoskeletal: M/S 5/5 throughout , Extremities without ischemic changes , R medial thigh incision healed, LUA AVG palpable with thrill and bruit  Neurologic: CN grossly intact, Pain and light touch intact in extremities except decreased in feet, Motor exam as listed above  Psychiatric: Judgment intact, Mood & affect appropriate for pt's clinical situation  Dermatologic: See M/S exam for extremity exam, no rashes otherwise noted  Lymph : No Cervical,  Axillary, or Inguinal lymphadenopathy    Non-Invasive Vascular Imaging  ABI (Date: 12/16/2014)  R:   ABI: 0.69,   DP: mono,   PT: mono,   TBI: 0  L:   ABI: 0.89,   DP: mono,   PT: mono,   TBI: 0.54   Outside Studies/Documentation 15 pages of outside documents were reviewed including: outpatient nephrology chart and labs.  Medical Decision Making  [name] is a 69 y.o. female who presents with: R > L lifestyle limiting intermittent claudication s/p prior stenting (likely SFA), history of IV contrast allergy, multiple co-morbidities including CAD, ESRD-HD   Normally, I would consider maximal medical management and walking plan first but this patient has already been doing this for the last few years with worsening of sx.  At this point, I suspect she has multi-level occlusive disease that is now affecting her ADL.  Based on this patient's history and physical exam, I recommend: Aortogram, bilateral leg runoff.  This is scheduled with Dr. Scot Dock on 22 DEC 16.  I doubt she will have any endovascular targets given history of prior bilateral stent placement (sounds like SFA).  I discussed in depth with the patient the nature of atherosclerosis, and emphasized the importance of maximal medical management including strict control of blood pressure, blood glucose, and lipid levels, antiplatelet agent, obtaining regular exercise, and cessation of smoking.    The patient is aware that without maximal medical management the underlying atherosclerotic disease process will progress, limiting the benefit of any interventions.  I discussed in depth with the patient a walking plan and how to execute such. The patient is currently on a statin: Pravachol. The patient is currently on an anti-platelet: ASA.  In event, bypass will be needed, she will need Cardiac consult to clear Anesthesia given prior history of PCI.  Reported she recently saw Dr. Percival Spanish but I don't see the  notes in the EMR.  Thank you for allowing [place] to participate in this patient's care.   Adele Barthel, MD [facility] Office: [phone] Pager: [phone]  12/16/2014, 12:03 PM

## 2014-12-31 NOTE — Discharge Instructions (Signed)
Angiogram, Care After °Refer to this sheet in the next few weeks. These instructions provide you with information about caring for yourself after your procedure. Your health care provider may also give you more specific instructions. Your treatment has been planned according to current medical practices, but problems sometimes occur. Call your health care provider if you have any problems or questions after your procedure. °WHAT TO EXPECT AFTER THE PROCEDURE °After your procedure, it is typical to have the following: °· Bruising at the catheter insertion site that usually fades within 1-2 weeks. °· Blood collecting in the tissue (hematoma) that may be painful to the touch. It should usually decrease in size and tenderness within 1-2 weeks. °HOME CARE INSTRUCTIONS °· Take medicines only as directed by your health care provider. °· You may shower 24-48 hours after the procedure or as directed by your health care provider. Remove the bandage (dressing) and gently wash the site with plain soap and water. Pat the area dry with a clean towel. Do not rub the site, because this may cause bleeding. °· Do not take baths, swim, or use a hot tub until your health care provider approves. °· Check your insertion site every day for redness, swelling, or drainage. °· Do not apply powder or lotion to the site. °· Do not lift over 10 lb (4.5 kg) for 5 days after your procedure or as directed by your health care provider. °· Ask your health care provider when it is okay to: °¨ Return to work or school. °¨ Resume usual physical activities or sports. °¨ Resume sexual activity. °· Do not drive home if you are discharged the same day as the procedure. Have someone else drive you. °· You may drive 24 hours after the procedure unless otherwise instructed by your health care provider. °· Do not operate machinery or power tools for 24 hours after the procedure or as directed by your health care provider. °· If your procedure was done as an  outpatient procedure, which means that you went home the same day as your procedure, a responsible adult should be with you for the first 24 hours after you arrive home. °· Keep all follow-up visits as directed by your health care provider. This is important. °SEEK MEDICAL CARE IF: °· You have a fever. °· You have chills. °· You have increased bleeding from the catheter insertion site. Hold pressure on the site. °SEEK IMMEDIATE MEDICAL CARE IF: °· You have unusual pain at the catheter insertion site. °· You have redness, warmth, or swelling at the catheter insertion site. °· You have drainage (other than a small amount of blood on the dressing) from the catheter insertion site. °· The catheter insertion site is bleeding, and the bleeding does not stop after 30 minutes of holding steady pressure on the site. °· The area near or just beyond the catheter insertion site becomes pale, cool, tingly, or numb. °  °This information is not intended to replace advice given to you by your health care provider. Make sure you discuss any questions you have with your health care provider. °  °Document Released: 07/07/2004 Document Revised: 01/09/2014 Document Reviewed: 05/22/2012 °Elsevier Interactive Patient Education ©2016 Elsevier Inc. ° °

## 2014-12-31 NOTE — Telephone Encounter (Signed)
-----   Message from Mena Goes, RN sent at 12/31/2014 12:49 PM EST ----- Regarding: schedule   ----- Message -----    From: Conrad Minneola, MD    Sent: 12/31/2014  11:43 AM      To: [phone]  HALSTON NOREEN [id number] 10/30/1945  PROCEDURE: 1.  Bilateral common femoral artery cannulation under ultrasound guidance 2.  Placement of catheter in aorta 3.  Aortogram 4.  Second order arterial selection 5.  Left leg runoff via catheter 6.  Right leg runoff via sheath  Follow-up: 4-6 weeks

## 2014-12-31 NOTE — Telephone Encounter (Signed)
LM for pt re appt, dpm °

## 2015-01-01 ENCOUNTER — Encounter (HOSPITAL_COMMUNITY): Payer: Self-pay | Admitting: Vascular Surgery

## 2015-01-01 DIAGNOSIS — N186 End stage renal disease: Secondary | ICD-10-CM | POA: Diagnosis not present

## 2015-01-01 DIAGNOSIS — E119 Type 2 diabetes mellitus without complications: Secondary | ICD-10-CM | POA: Diagnosis not present

## 2015-01-01 DIAGNOSIS — D509 Iron deficiency anemia, unspecified: Secondary | ICD-10-CM | POA: Diagnosis not present

## 2015-01-01 DIAGNOSIS — D508 Other iron deficiency anemias: Secondary | ICD-10-CM | POA: Diagnosis not present

## 2015-01-01 DIAGNOSIS — D631 Anemia in chronic kidney disease: Secondary | ICD-10-CM | POA: Diagnosis not present

## 2015-01-01 DIAGNOSIS — N2581 Secondary hyperparathyroidism of renal origin: Secondary | ICD-10-CM | POA: Diagnosis not present

## 2015-01-02 DIAGNOSIS — N186 End stage renal disease: Secondary | ICD-10-CM | POA: Diagnosis not present

## 2015-01-02 DIAGNOSIS — Z992 Dependence on renal dialysis: Secondary | ICD-10-CM | POA: Diagnosis not present

## 2015-01-02 DIAGNOSIS — E1129 Type 2 diabetes mellitus with other diabetic kidney complication: Secondary | ICD-10-CM | POA: Diagnosis not present

## 2015-01-04 DIAGNOSIS — D509 Iron deficiency anemia, unspecified: Secondary | ICD-10-CM | POA: Diagnosis not present

## 2015-01-04 DIAGNOSIS — E119 Type 2 diabetes mellitus without complications: Secondary | ICD-10-CM | POA: Diagnosis not present

## 2015-01-04 DIAGNOSIS — D508 Other iron deficiency anemias: Secondary | ICD-10-CM | POA: Diagnosis not present

## 2015-01-04 DIAGNOSIS — D631 Anemia in chronic kidney disease: Secondary | ICD-10-CM | POA: Diagnosis not present

## 2015-01-04 DIAGNOSIS — N186 End stage renal disease: Secondary | ICD-10-CM | POA: Diagnosis not present

## 2015-01-04 DIAGNOSIS — N2581 Secondary hyperparathyroidism of renal origin: Secondary | ICD-10-CM | POA: Diagnosis not present

## 2015-01-06 DIAGNOSIS — N2581 Secondary hyperparathyroidism of renal origin: Secondary | ICD-10-CM | POA: Diagnosis not present

## 2015-01-06 DIAGNOSIS — D631 Anemia in chronic kidney disease: Secondary | ICD-10-CM | POA: Diagnosis not present

## 2015-01-06 DIAGNOSIS — N186 End stage renal disease: Secondary | ICD-10-CM | POA: Diagnosis not present

## 2015-01-06 DIAGNOSIS — D508 Other iron deficiency anemias: Secondary | ICD-10-CM | POA: Diagnosis not present

## 2015-01-06 DIAGNOSIS — D509 Iron deficiency anemia, unspecified: Secondary | ICD-10-CM | POA: Diagnosis not present

## 2015-01-06 DIAGNOSIS — E119 Type 2 diabetes mellitus without complications: Secondary | ICD-10-CM | POA: Diagnosis not present

## 2015-01-08 DIAGNOSIS — N2581 Secondary hyperparathyroidism of renal origin: Secondary | ICD-10-CM | POA: Diagnosis not present

## 2015-01-08 DIAGNOSIS — N186 End stage renal disease: Secondary | ICD-10-CM | POA: Diagnosis not present

## 2015-01-08 DIAGNOSIS — D509 Iron deficiency anemia, unspecified: Secondary | ICD-10-CM | POA: Diagnosis not present

## 2015-01-08 DIAGNOSIS — D508 Other iron deficiency anemias: Secondary | ICD-10-CM | POA: Diagnosis not present

## 2015-01-08 DIAGNOSIS — D631 Anemia in chronic kidney disease: Secondary | ICD-10-CM | POA: Diagnosis not present

## 2015-01-08 DIAGNOSIS — E119 Type 2 diabetes mellitus without complications: Secondary | ICD-10-CM | POA: Diagnosis not present

## 2015-01-11 DIAGNOSIS — N186 End stage renal disease: Secondary | ICD-10-CM | POA: Diagnosis not present

## 2015-01-11 DIAGNOSIS — D631 Anemia in chronic kidney disease: Secondary | ICD-10-CM | POA: Diagnosis not present

## 2015-01-11 DIAGNOSIS — D509 Iron deficiency anemia, unspecified: Secondary | ICD-10-CM | POA: Diagnosis not present

## 2015-01-11 DIAGNOSIS — D508 Other iron deficiency anemias: Secondary | ICD-10-CM | POA: Diagnosis not present

## 2015-01-11 DIAGNOSIS — E119 Type 2 diabetes mellitus without complications: Secondary | ICD-10-CM | POA: Diagnosis not present

## 2015-01-11 DIAGNOSIS — N2581 Secondary hyperparathyroidism of renal origin: Secondary | ICD-10-CM | POA: Diagnosis not present

## 2015-01-13 DIAGNOSIS — D631 Anemia in chronic kidney disease: Secondary | ICD-10-CM | POA: Diagnosis not present

## 2015-01-13 DIAGNOSIS — N186 End stage renal disease: Secondary | ICD-10-CM | POA: Diagnosis not present

## 2015-01-13 DIAGNOSIS — D509 Iron deficiency anemia, unspecified: Secondary | ICD-10-CM | POA: Diagnosis not present

## 2015-01-13 DIAGNOSIS — E119 Type 2 diabetes mellitus without complications: Secondary | ICD-10-CM | POA: Diagnosis not present

## 2015-01-13 DIAGNOSIS — N2581 Secondary hyperparathyroidism of renal origin: Secondary | ICD-10-CM | POA: Diagnosis not present

## 2015-01-13 DIAGNOSIS — D508 Other iron deficiency anemias: Secondary | ICD-10-CM | POA: Diagnosis not present

## 2015-01-15 DIAGNOSIS — N2581 Secondary hyperparathyroidism of renal origin: Secondary | ICD-10-CM | POA: Diagnosis not present

## 2015-01-15 DIAGNOSIS — D631 Anemia in chronic kidney disease: Secondary | ICD-10-CM | POA: Diagnosis not present

## 2015-01-15 DIAGNOSIS — E119 Type 2 diabetes mellitus without complications: Secondary | ICD-10-CM | POA: Diagnosis not present

## 2015-01-15 DIAGNOSIS — N186 End stage renal disease: Secondary | ICD-10-CM | POA: Diagnosis not present

## 2015-01-15 DIAGNOSIS — D509 Iron deficiency anemia, unspecified: Secondary | ICD-10-CM | POA: Diagnosis not present

## 2015-01-15 DIAGNOSIS — D508 Other iron deficiency anemias: Secondary | ICD-10-CM | POA: Diagnosis not present

## 2015-01-18 DIAGNOSIS — N2581 Secondary hyperparathyroidism of renal origin: Secondary | ICD-10-CM | POA: Diagnosis not present

## 2015-01-18 DIAGNOSIS — E119 Type 2 diabetes mellitus without complications: Secondary | ICD-10-CM | POA: Diagnosis not present

## 2015-01-18 DIAGNOSIS — N186 End stage renal disease: Secondary | ICD-10-CM | POA: Diagnosis not present

## 2015-01-18 DIAGNOSIS — D508 Other iron deficiency anemias: Secondary | ICD-10-CM | POA: Diagnosis not present

## 2015-01-18 DIAGNOSIS — D631 Anemia in chronic kidney disease: Secondary | ICD-10-CM | POA: Diagnosis not present

## 2015-01-18 DIAGNOSIS — D509 Iron deficiency anemia, unspecified: Secondary | ICD-10-CM | POA: Diagnosis not present

## 2015-01-20 DIAGNOSIS — N2581 Secondary hyperparathyroidism of renal origin: Secondary | ICD-10-CM | POA: Diagnosis not present

## 2015-01-20 DIAGNOSIS — D508 Other iron deficiency anemias: Secondary | ICD-10-CM | POA: Diagnosis not present

## 2015-01-20 DIAGNOSIS — N186 End stage renal disease: Secondary | ICD-10-CM | POA: Diagnosis not present

## 2015-01-20 DIAGNOSIS — E119 Type 2 diabetes mellitus without complications: Secondary | ICD-10-CM | POA: Diagnosis not present

## 2015-01-20 DIAGNOSIS — D509 Iron deficiency anemia, unspecified: Secondary | ICD-10-CM | POA: Diagnosis not present

## 2015-01-20 DIAGNOSIS — D631 Anemia in chronic kidney disease: Secondary | ICD-10-CM | POA: Diagnosis not present

## 2015-01-22 ENCOUNTER — Encounter: Payer: Self-pay | Admitting: Vascular Surgery

## 2015-01-22 DIAGNOSIS — D509 Iron deficiency anemia, unspecified: Secondary | ICD-10-CM | POA: Diagnosis not present

## 2015-01-22 DIAGNOSIS — D631 Anemia in chronic kidney disease: Secondary | ICD-10-CM | POA: Diagnosis not present

## 2015-01-22 DIAGNOSIS — E119 Type 2 diabetes mellitus without complications: Secondary | ICD-10-CM | POA: Diagnosis not present

## 2015-01-22 DIAGNOSIS — N186 End stage renal disease: Secondary | ICD-10-CM | POA: Diagnosis not present

## 2015-01-22 DIAGNOSIS — N2581 Secondary hyperparathyroidism of renal origin: Secondary | ICD-10-CM | POA: Diagnosis not present

## 2015-01-22 DIAGNOSIS — D508 Other iron deficiency anemias: Secondary | ICD-10-CM | POA: Diagnosis not present

## 2015-01-25 ENCOUNTER — Telehealth: Payer: Self-pay | Admitting: Pulmonary Disease

## 2015-01-25 DIAGNOSIS — N2581 Secondary hyperparathyroidism of renal origin: Secondary | ICD-10-CM | POA: Diagnosis not present

## 2015-01-25 DIAGNOSIS — D508 Other iron deficiency anemias: Secondary | ICD-10-CM | POA: Diagnosis not present

## 2015-01-25 DIAGNOSIS — D631 Anemia in chronic kidney disease: Secondary | ICD-10-CM | POA: Diagnosis not present

## 2015-01-25 DIAGNOSIS — N186 End stage renal disease: Secondary | ICD-10-CM | POA: Diagnosis not present

## 2015-01-25 DIAGNOSIS — D509 Iron deficiency anemia, unspecified: Secondary | ICD-10-CM | POA: Diagnosis not present

## 2015-01-25 DIAGNOSIS — E119 Type 2 diabetes mellitus without complications: Secondary | ICD-10-CM | POA: Diagnosis not present

## 2015-01-25 NOTE — Telephone Encounter (Signed)
LMTCB x 1 

## 2015-01-26 NOTE — Telephone Encounter (Signed)
lmtcb for pt.  

## 2015-01-27 DIAGNOSIS — N186 End stage renal disease: Secondary | ICD-10-CM | POA: Diagnosis not present

## 2015-01-27 DIAGNOSIS — D509 Iron deficiency anemia, unspecified: Secondary | ICD-10-CM | POA: Diagnosis not present

## 2015-01-27 DIAGNOSIS — E119 Type 2 diabetes mellitus without complications: Secondary | ICD-10-CM | POA: Diagnosis not present

## 2015-01-27 DIAGNOSIS — D631 Anemia in chronic kidney disease: Secondary | ICD-10-CM | POA: Diagnosis not present

## 2015-01-27 DIAGNOSIS — D508 Other iron deficiency anemias: Secondary | ICD-10-CM | POA: Diagnosis not present

## 2015-01-27 DIAGNOSIS — N2581 Secondary hyperparathyroidism of renal origin: Secondary | ICD-10-CM | POA: Diagnosis not present

## 2015-01-27 NOTE — Telephone Encounter (Signed)
Pt returning call.[name] ° °

## 2015-01-27 NOTE — Telephone Encounter (Signed)
Spoke with pt, states that she is having a hard time getting grants for [employer] assistance- was given two companies to help with copay assistance:  CCV-pt states they have run out of funds Good Day- is unable to get through on the phone, and does not have a computer to apply online.  Pt states that her daughter has a computer at work that she can try to apply with.  Medication without copay assistance costs pt over $2k/month.   Pt has been out of Letairis since Friday.  Cannot afford refill without copay assistance.  BQ please advise if you have a suggestion for an alternative med.  Thanks!

## 2015-01-27 NOTE — Telephone Encounter (Signed)
lmtcb x3 for pt. 

## 2015-01-29 ENCOUNTER — Ambulatory Visit (INDEPENDENT_AMBULATORY_CARE_PROVIDER_SITE_OTHER): Payer: Medicare Other | Admitting: Vascular Surgery

## 2015-01-29 ENCOUNTER — Encounter: Payer: Self-pay | Admitting: Vascular Surgery

## 2015-01-29 VITALS — BP 189/80 | HR 66 | Ht 62.0 in | Wt 162.2 lb

## 2015-01-29 DIAGNOSIS — I70213 Atherosclerosis of native arteries of extremities with intermittent claudication, bilateral legs: Secondary | ICD-10-CM | POA: Diagnosis not present

## 2015-01-29 NOTE — Progress Notes (Signed)
Postoperative Visit   History of Present Illness  [name] is a 70 y.o. female who presents for postoperative follow-up from procedure on Date: 12/31/14: Ao, BRo .  The patient's lower extremity symptoms are unchanged: intermittent claudication.  The patient is able to complete their activities of daily living.  The patient denies rest pain at this point.  She is starting the walking plan, walking every day as instructed.   Past Medical History, Past Surgical History, Social History, Family History, Medications, Allergies, and Review of Systems are unchanged from previous evaluation on 12/31/14.  Current Outpatient Prescriptions  Medication Sig Dispense Refill  . acetaminophen (TYLENOL) 500 MG tablet Take 1,000 mg by mouth daily as needed for mild pain.     [name] albuterol (PROVENTIL HFA;VENTOLIN HFA) 108 (90 BASE) MCG/ACT inhaler Inhale 1-2 puffs into the lungs every 6 (six) hours as needed for wheezing or shortness of breath. 1 Inhaler 0  . amLODipine (NORVASC) 10 MG tablet Take 10 mg by mouth at bedtime.     [name] aspirin EC 81 MG tablet Take 81 mg by mouth daily.    . carvedilol (COREG) 25 MG tablet Take 25 mg by mouth 2 (two) times daily with a meal.    . diphenhydrAMINE (BENADRYL) 50 MG capsule Take 50 mg by mouth on 12/24/14, at 6:30 am. (Patient taking differently: Take 50 mg by mouth every Monday, Wednesday, and Friday. On dialysis days) 1 capsule 0  . hydrALAZINE (APRESOLINE) 50 MG tablet Take 50 mg by mouth 3 (three) times daily.    . hydrOXYzine (ATARAX/VISTARIL) 25 MG tablet Take 25 mg by mouth daily as needed for itching. Reported on 12/16/2014  6  . isosorbide mononitrate (IMDUR) 30 MG 24 hr tablet TAKE 1 TABLET (30 MG TOTAL) BY MOUTH DAILY. 30 tablet 10  . LETAIRIS 5 MG tablet TAKE 1 TABLET (5 MG) ORALLY DAILY. DO NOT HANDLE IF PREGNANT. DO NOT SPLIT, CRUSH OR CHEW. AVOID INHALATION AND CONTACT WITH SKIN OR EYE. RE 30 tablet 2  . levETIRAcetam (KEPPRA) 500 MG tablet Take  500 mg by mouth daily at 8 pm.    . levothyroxine (SYNTHROID, LEVOTHROID) 88 MCG tablet Take 88 mcg by mouth daily before breakfast.    . multivitamin (RENA-VIT) TABS tablet Take 1 tablet by mouth daily.    [name] NITROSTAT 0.4 MG SL tablet Place 0.4 mg under the tongue every 5 (five) minutes as needed for chest pain.     [name] omeprazole (PRILOSEC OTC) 20 MG tablet Take 20 mg by mouth daily.    . pravastatin (PRAVACHOL) 40 MG tablet Take 40 mg by mouth at bedtime.     . predniSONE (DELTASONE) 50 MG tablet One tablet (50mg ) 12/23/14 at 6:30 pm; one tablet (50mg ) 12/24/14 at 12:30 am and then one tablet (50 mg) 12/24/14 at 6:30 am. (Patient taking differently: Take 50 mg by mouth once. ) 3 tablet 0  . SENSIPAR 90 MG tablet Take 90 mg by mouth at bedtime.     . sevelamer carbonate (RENVELA) 800 MG tablet Take 1,600-3,200 mg by mouth 3 (three) times daily. 4 tabs TID, 2 tabs with each snack    . traMADol (ULTRAM) 50 MG tablet Take 50 mg by mouth daily as needed for moderate pain.     . traZODone (DESYREL) 50 MG tablet Take 50 mg by mouth at bedtime as needed for sleep.     Shela Leff C-NR 100-10 MG/5ML syrup     . predniSONE (  DELTASONE) 20 MG tablet      No current facility-administered medications for this visit.   On ROS: no rest pain, +intermittent claudication    For VQI Use Only  PRE-ADM LIVING: Home  AMB STATUS: Ambulatory   Physical Examination  Filed Vitals:   01/29/15 1004 01/29/15 1006  BP: 195/82 189/80  Pulse: 66   Height: 5\' 2"  (1.575 m)   Weight: 162 lb 3.2 oz (73.573 kg)   SpO2: 98%    Body mass index is 29.66 kg/(m^2).  General: A&O x 3, WDWN  Pulmonary: Sym exp, good air movt, CTAB, no rales, rhonchi, & wheezing  Cardiac: RRR, Nl S1, S2, no Murmurs, rubs or gallops  Vascular: Vessel Right Left  Radial Palpable Not Palpable  Brachial Palpable Palpable  Carotid Palpable, without bruit Palpable, without bruit  Aorta Not palpable N/A  Femoral Palpable  Palpable  Popliteal Not palpable Not palpable  PT Not Palpable Not Palpable  DP Not Palpable Not Palpable   Gastrointestinal: soft, NTND, no G/R, no HSM, no masses, no CVAT B, mid-line incision healed  Musculoskeletal: M/S 5/5 throughout , Extremities without ischemic changes , R medial thigh incision healed, LUA AVG palpable with thrill and bruit  Neurologic: CN grossly intact, Pain and light touch intact in extremities except decreased in feet, Motor exam as listed above   Medical Decision Making  [name] is a 70 y.o. female who presents s/p diagnostic aortogram with bilateral leg runoff for BLE intermittent claudication . The angiogram demonstrates bilateral occluded stents with occluded SFA with extensive collateralization via the PFA.  She has brisk refilling of her AK popliteal arteries. Based on his angiographic findings, this patient needs: no immediate intervention thus she might be a candidate for B fem-AK pop bypasses. She does NOT want bypasses at this point and is making an attempt at maximal medical mgmt. She knows she needs to quit smoke, but declines assistance at this time. I discussed in depth with the patient the nature of atherosclerosis, and emphasized the importance of maximal medical management including strict control of blood pressure, blood glucose, and lipid levels, obtaining regular exercise, and cessation of smoking.  The patient is aware that without maximal medical management the underlying atherosclerotic disease process will progress, limiting the benefit of any interventions. The patient is currently on a statin: Pravachol. The patient is currently on an anti-platelet: ASA. She will follow up in 3 months with my NP with [facility] to continue surveillance. We discussed the findings and sx of CLI, so she will return sooner if necessary.  Thank you for allowing [place] to participate in this patient's care.  Adele Barthel, MD [facility] Office: [phone] Pager: [phone]

## 2015-01-29 NOTE — Telephone Encounter (Signed)
Cannot locate telephone number for drug rep, Elberta Fortis, so I called the rep we have in our files which is for [employer] at [place].  Left detailed message on voicemail advising Rolena Infante that we need to get in touch with our drug rep and requesting that he call back to help [place] get in touch with him. Awaiting call back from [facility]. Attempted to contact the main number for [place], the office does not open until 8am pacific time. Will have to call back.

## 2015-01-29 NOTE — Telephone Encounter (Signed)
Spoke with Joe (our drug rep for [employer])  He says that he will bring by samples to give to patient to hold her over until she can get set up with patient assistance program. He says that she can contact: [employer] and [employer] [phone] to set up for patient assistance.  Attempted to contact patient, left message for her to call back. Awaiting samples to be dropped off by Joe to give to patient  FYI to BQ

## 2015-01-29 NOTE — Telephone Encounter (Signed)
We need to contact [name], the [place] rep immediately.  Please see if we can find his number and reach out to him today please.

## 2015-01-29 NOTE — Telephone Encounter (Signed)
Received samples of Letairis for patient.  Placed box of samples at front for patient to pick up. Attempted to contact patient again today and left message for her to call back. Need to give patient information regarding LEAP below as well as inform her that samples are ready for pick up. Will hold in triage until patient has been informed.

## 2015-01-30 DIAGNOSIS — D508 Other iron deficiency anemias: Secondary | ICD-10-CM | POA: Diagnosis not present

## 2015-01-30 DIAGNOSIS — E119 Type 2 diabetes mellitus without complications: Secondary | ICD-10-CM | POA: Diagnosis not present

## 2015-01-30 DIAGNOSIS — N2581 Secondary hyperparathyroidism of renal origin: Secondary | ICD-10-CM | POA: Diagnosis not present

## 2015-01-30 DIAGNOSIS — D509 Iron deficiency anemia, unspecified: Secondary | ICD-10-CM | POA: Diagnosis not present

## 2015-01-30 DIAGNOSIS — D631 Anemia in chronic kidney disease: Secondary | ICD-10-CM | POA: Diagnosis not present

## 2015-01-30 DIAGNOSIS — N186 End stage renal disease: Secondary | ICD-10-CM | POA: Diagnosis not present

## 2015-02-01 DIAGNOSIS — E119 Type 2 diabetes mellitus without complications: Secondary | ICD-10-CM | POA: Diagnosis not present

## 2015-02-01 DIAGNOSIS — D631 Anemia in chronic kidney disease: Secondary | ICD-10-CM | POA: Diagnosis not present

## 2015-02-01 DIAGNOSIS — N186 End stage renal disease: Secondary | ICD-10-CM | POA: Diagnosis not present

## 2015-02-01 DIAGNOSIS — D509 Iron deficiency anemia, unspecified: Secondary | ICD-10-CM | POA: Diagnosis not present

## 2015-02-01 DIAGNOSIS — D508 Other iron deficiency anemias: Secondary | ICD-10-CM | POA: Diagnosis not present

## 2015-02-01 DIAGNOSIS — N2581 Secondary hyperparathyroidism of renal origin: Secondary | ICD-10-CM | POA: Diagnosis not present

## 2015-02-01 NOTE — Telephone Encounter (Signed)
Pt cb to let use know she has medicine and dose not need any samples, [phone]

## 2015-02-01 NOTE — Telephone Encounter (Signed)
Called and left message on patient's voicemail to have her call me back. Also called patient's daughter [name] and requested that she have her mother return my call. Awaiting call back.

## 2015-02-01 NOTE — Telephone Encounter (Signed)
OK - thanks

## 2015-02-02 DIAGNOSIS — Z992 Dependence on renal dialysis: Secondary | ICD-10-CM | POA: Diagnosis not present

## 2015-02-02 DIAGNOSIS — E1129 Type 2 diabetes mellitus with other diabetic kidney complication: Secondary | ICD-10-CM | POA: Diagnosis not present

## 2015-02-02 DIAGNOSIS — N186 End stage renal disease: Secondary | ICD-10-CM | POA: Diagnosis not present

## 2015-02-03 DIAGNOSIS — N186 End stage renal disease: Secondary | ICD-10-CM | POA: Diagnosis not present

## 2015-02-03 DIAGNOSIS — D2372 Other benign neoplasm of skin of left lower limb, including hip: Secondary | ICD-10-CM | POA: Diagnosis not present

## 2015-02-03 DIAGNOSIS — D509 Iron deficiency anemia, unspecified: Secondary | ICD-10-CM | POA: Diagnosis not present

## 2015-02-03 DIAGNOSIS — E119 Type 2 diabetes mellitus without complications: Secondary | ICD-10-CM | POA: Diagnosis not present

## 2015-02-03 DIAGNOSIS — M79672 Pain in left foot: Secondary | ICD-10-CM | POA: Diagnosis not present

## 2015-02-03 DIAGNOSIS — N2581 Secondary hyperparathyroidism of renal origin: Secondary | ICD-10-CM | POA: Diagnosis not present

## 2015-02-03 DIAGNOSIS — D631 Anemia in chronic kidney disease: Secondary | ICD-10-CM | POA: Diagnosis not present

## 2015-02-03 DIAGNOSIS — M216X2 Other acquired deformities of left foot: Secondary | ICD-10-CM | POA: Diagnosis not present

## 2015-02-03 DIAGNOSIS — I739 Peripheral vascular disease, unspecified: Secondary | ICD-10-CM | POA: Diagnosis not present

## 2015-02-04 DIAGNOSIS — N186 End stage renal disease: Secondary | ICD-10-CM | POA: Diagnosis not present

## 2015-02-04 DIAGNOSIS — T82858D Stenosis of vascular prosthetic devices, implants and grafts, subsequent encounter: Secondary | ICD-10-CM | POA: Diagnosis not present

## 2015-02-04 DIAGNOSIS — Z992 Dependence on renal dialysis: Secondary | ICD-10-CM | POA: Diagnosis not present

## 2015-02-04 DIAGNOSIS — I871 Compression of vein: Secondary | ICD-10-CM | POA: Diagnosis not present

## 2015-02-05 DIAGNOSIS — N2581 Secondary hyperparathyroidism of renal origin: Secondary | ICD-10-CM | POA: Diagnosis not present

## 2015-02-05 DIAGNOSIS — D509 Iron deficiency anemia, unspecified: Secondary | ICD-10-CM | POA: Diagnosis not present

## 2015-02-05 DIAGNOSIS — D631 Anemia in chronic kidney disease: Secondary | ICD-10-CM | POA: Diagnosis not present

## 2015-02-05 DIAGNOSIS — N186 End stage renal disease: Secondary | ICD-10-CM | POA: Diagnosis not present

## 2015-02-05 DIAGNOSIS — E119 Type 2 diabetes mellitus without complications: Secondary | ICD-10-CM | POA: Diagnosis not present

## 2015-02-08 DIAGNOSIS — E119 Type 2 diabetes mellitus without complications: Secondary | ICD-10-CM | POA: Diagnosis not present

## 2015-02-08 DIAGNOSIS — D509 Iron deficiency anemia, unspecified: Secondary | ICD-10-CM | POA: Diagnosis not present

## 2015-02-08 DIAGNOSIS — N2581 Secondary hyperparathyroidism of renal origin: Secondary | ICD-10-CM | POA: Diagnosis not present

## 2015-02-08 DIAGNOSIS — D631 Anemia in chronic kidney disease: Secondary | ICD-10-CM | POA: Diagnosis not present

## 2015-02-08 DIAGNOSIS — N186 End stage renal disease: Secondary | ICD-10-CM | POA: Diagnosis not present

## 2015-02-10 ENCOUNTER — Telehealth: Payer: Self-pay | Admitting: Pulmonary Disease

## 2015-02-10 DIAGNOSIS — N2581 Secondary hyperparathyroidism of renal origin: Secondary | ICD-10-CM | POA: Diagnosis not present

## 2015-02-10 DIAGNOSIS — E119 Type 2 diabetes mellitus without complications: Secondary | ICD-10-CM | POA: Diagnosis not present

## 2015-02-10 DIAGNOSIS — D631 Anemia in chronic kidney disease: Secondary | ICD-10-CM | POA: Diagnosis not present

## 2015-02-10 DIAGNOSIS — D509 Iron deficiency anemia, unspecified: Secondary | ICD-10-CM | POA: Diagnosis not present

## 2015-02-10 DIAGNOSIS — N186 End stage renal disease: Secondary | ICD-10-CM | POA: Diagnosis not present

## 2015-02-10 NOTE — Telephone Encounter (Signed)
LMTCB for [name] 

## 2015-02-11 NOTE — Telephone Encounter (Signed)
Called spoke with [name] w/ [facility]. Made aware pt has not had lab work yet for Jan/Feb. She verbalized understanding and needed nothing further

## 2015-02-12 ENCOUNTER — Other Ambulatory Visit (INDEPENDENT_AMBULATORY_CARE_PROVIDER_SITE_OTHER): Payer: Medicare Other

## 2015-02-12 DIAGNOSIS — D631 Anemia in chronic kidney disease: Secondary | ICD-10-CM | POA: Diagnosis not present

## 2015-02-12 DIAGNOSIS — I272 Other secondary pulmonary hypertension: Secondary | ICD-10-CM

## 2015-02-12 DIAGNOSIS — E119 Type 2 diabetes mellitus without complications: Secondary | ICD-10-CM | POA: Diagnosis not present

## 2015-02-12 DIAGNOSIS — N186 End stage renal disease: Secondary | ICD-10-CM | POA: Diagnosis not present

## 2015-02-12 DIAGNOSIS — Z5181 Encounter for therapeutic drug level monitoring: Secondary | ICD-10-CM | POA: Diagnosis not present

## 2015-02-12 DIAGNOSIS — D509 Iron deficiency anemia, unspecified: Secondary | ICD-10-CM | POA: Diagnosis not present

## 2015-02-12 DIAGNOSIS — N2581 Secondary hyperparathyroidism of renal origin: Secondary | ICD-10-CM | POA: Diagnosis not present

## 2015-02-12 LAB — COMPLETE METABOLIC PANEL WITH GFR
ALK PHOS: 60 U/L (ref 33–130)
ALT: 9 U/L (ref 6–29)
AST: 18 U/L (ref 10–35)
Albumin: 3.7 g/dL (ref 3.6–5.1)
BUN: 12 mg/dL (ref 7–25)
CO2: 36 mmol/L — ABNORMAL HIGH (ref 20–31)
Calcium: 8.7 mg/dL (ref 8.6–10.4)
Chloride: 96 mmol/L — ABNORMAL LOW (ref 98–110)
Creat: 3.86 mg/dL — ABNORMAL HIGH (ref 0.60–0.93)
GFR, EST AFRICAN AMERICAN: 13 mL/min — AB (ref >=60)
GFR, Est Non African American: 11 mL/min — ABNORMAL LOW (ref >=60)
Glucose, Bld: 87 mg/dL (ref 65–99)
Potassium: 4.7 mmol/L (ref 3.5–5.3)
Sodium: 139 mmol/L (ref 135–146)
TOTAL PROTEIN: 6.5 g/dL (ref 6.1–8.1)
Total Bilirubin: 0.7 mg/dL (ref 0.2–1.2)

## 2015-02-12 LAB — HEPATIC FUNCTION PANEL
ALT: 10 U/L (ref 0–35)
AST: 18 U/L (ref 0–37)
Albumin: 3.9 g/dL (ref 3.5–5.2)
Alkaline Phosphatase: 59 U/L (ref 39–117)
BILIRUBIN TOTAL: 0.7 mg/dL (ref 0.2–1.2)
Bilirubin, Direct: 0.1 mg/dL (ref 0.0–0.3)
Total Protein: 7 g/dL (ref 6.0–8.3)

## 2015-02-15 ENCOUNTER — Telehealth: Payer: Self-pay | Admitting: Pulmonary Disease

## 2015-02-15 DIAGNOSIS — N186 End stage renal disease: Secondary | ICD-10-CM | POA: Diagnosis not present

## 2015-02-15 DIAGNOSIS — E119 Type 2 diabetes mellitus without complications: Secondary | ICD-10-CM | POA: Diagnosis not present

## 2015-02-15 DIAGNOSIS — D509 Iron deficiency anemia, unspecified: Secondary | ICD-10-CM | POA: Diagnosis not present

## 2015-02-15 DIAGNOSIS — D631 Anemia in chronic kidney disease: Secondary | ICD-10-CM | POA: Diagnosis not present

## 2015-02-15 DIAGNOSIS — I272 Pulmonary hypertension, unspecified: Secondary | ICD-10-CM

## 2015-02-15 DIAGNOSIS — N2581 Secondary hyperparathyroidism of renal origin: Secondary | ICD-10-CM | POA: Diagnosis not present

## 2015-02-15 NOTE — Telephone Encounter (Signed)
[name] with [facility] returned call, [phone] ext [phone]

## 2015-02-15 NOTE — Telephone Encounter (Signed)
lmtcb x1 for Teresa. 

## 2015-02-15 NOTE — Telephone Encounter (Signed)
Spoke with [name]. Advised her that the CMP and Hepatic Panel were drawn. BQ wanted CMP, Hepatic and CBC to be drawn quarterly. [name] is going to contact the pt to have her come back and have the CBC drawn. Nothing further was needed.

## 2015-02-17 ENCOUNTER — Telehealth: Payer: Self-pay | Admitting: Pulmonary Disease

## 2015-02-17 DIAGNOSIS — N186 End stage renal disease: Secondary | ICD-10-CM | POA: Diagnosis not present

## 2015-02-17 DIAGNOSIS — D631 Anemia in chronic kidney disease: Secondary | ICD-10-CM | POA: Diagnosis not present

## 2015-02-17 DIAGNOSIS — D509 Iron deficiency anemia, unspecified: Secondary | ICD-10-CM | POA: Diagnosis not present

## 2015-02-17 DIAGNOSIS — E119 Type 2 diabetes mellitus without complications: Secondary | ICD-10-CM | POA: Diagnosis not present

## 2015-02-17 DIAGNOSIS — N2581 Secondary hyperparathyroidism of renal origin: Secondary | ICD-10-CM | POA: Diagnosis not present

## 2015-02-17 NOTE — Telephone Encounter (Signed)
LMTCB x1 for pt to ensure she is coming this week.

## 2015-02-18 NOTE — Telephone Encounter (Signed)
LMTCB for pt x 2  Looked and did not see labs have been done yet.

## 2015-02-19 DIAGNOSIS — D509 Iron deficiency anemia, unspecified: Secondary | ICD-10-CM | POA: Diagnosis not present

## 2015-02-19 DIAGNOSIS — D631 Anemia in chronic kidney disease: Secondary | ICD-10-CM | POA: Diagnosis not present

## 2015-02-19 DIAGNOSIS — E119 Type 2 diabetes mellitus without complications: Secondary | ICD-10-CM | POA: Diagnosis not present

## 2015-02-19 DIAGNOSIS — N186 End stage renal disease: Secondary | ICD-10-CM | POA: Diagnosis not present

## 2015-02-19 DIAGNOSIS — N2581 Secondary hyperparathyroidism of renal origin: Secondary | ICD-10-CM | POA: Diagnosis not present

## 2015-02-19 NOTE — Telephone Encounter (Signed)
Cbc not yet drawn. lmtcb X3 for pt to ensure this will be drawn.

## 2015-02-22 DIAGNOSIS — N2581 Secondary hyperparathyroidism of renal origin: Secondary | ICD-10-CM | POA: Diagnosis not present

## 2015-02-22 DIAGNOSIS — D509 Iron deficiency anemia, unspecified: Secondary | ICD-10-CM | POA: Diagnosis not present

## 2015-02-22 DIAGNOSIS — D631 Anemia in chronic kidney disease: Secondary | ICD-10-CM | POA: Diagnosis not present

## 2015-02-22 DIAGNOSIS — N186 End stage renal disease: Secondary | ICD-10-CM | POA: Diagnosis not present

## 2015-02-22 DIAGNOSIS — E119 Type 2 diabetes mellitus without complications: Secondary | ICD-10-CM | POA: Diagnosis not present

## 2015-02-22 NOTE — Telephone Encounter (Signed)
CBC still not drawn. LMTCB for pt

## 2015-02-23 NOTE — Telephone Encounter (Signed)
lmtcb X4 for pt- labs still not drawn

## 2015-02-24 NOTE — Telephone Encounter (Signed)
Spoke with pt. She is aware that she needs to have a CBC drawn. States that her sister passed away and she will come by soon to have this done. Nothing further was needed.

## 2015-02-26 ENCOUNTER — Other Ambulatory Visit (INDEPENDENT_AMBULATORY_CARE_PROVIDER_SITE_OTHER): Payer: Medicare Other

## 2015-02-26 DIAGNOSIS — E119 Type 2 diabetes mellitus without complications: Secondary | ICD-10-CM | POA: Diagnosis not present

## 2015-02-26 DIAGNOSIS — D509 Iron deficiency anemia, unspecified: Secondary | ICD-10-CM | POA: Diagnosis not present

## 2015-02-26 DIAGNOSIS — N186 End stage renal disease: Secondary | ICD-10-CM | POA: Diagnosis not present

## 2015-02-26 DIAGNOSIS — D631 Anemia in chronic kidney disease: Secondary | ICD-10-CM | POA: Diagnosis not present

## 2015-02-26 DIAGNOSIS — N2581 Secondary hyperparathyroidism of renal origin: Secondary | ICD-10-CM | POA: Diagnosis not present

## 2015-02-26 DIAGNOSIS — I272 Other secondary pulmonary hypertension: Secondary | ICD-10-CM

## 2015-02-26 LAB — CBC WITH DIFFERENTIAL/PLATELET
BASOS PCT: 1.1 % (ref 0.0–3.0)
Basophils Absolute: 0 10*3/uL (ref 0.0–0.1)
EOS PCT: 5.4 % — AB (ref 0.0–5.0)
Eosinophils Absolute: 0.2 10*3/uL (ref 0.0–0.7)
HCT: 35.8 % — ABNORMAL LOW (ref 36.0–46.0)
HEMOGLOBIN: 11.9 g/dL — AB (ref 12.0–15.0)
LYMPHS ABS: 1.4 10*3/uL (ref 0.7–4.0)
Lymphocytes Relative: 40.8 % (ref 12.0–46.0)
MCHC: 33.3 g/dL (ref 30.0–36.0)
MCV: 80.5 fl (ref 78.0–100.0)
MONO ABS: 0.5 10*3/uL (ref 0.1–1.0)
Monocytes Relative: 13.9 % — ABNORMAL HIGH (ref 3.0–12.0)
NEUTROS ABS: 1.3 10*3/uL — AB (ref 1.4–7.7)
NEUTROS PCT: 38.8 % — AB (ref 43.0–77.0)
PLATELETS: 194 10*3/uL (ref 150.0–400.0)
RBC: 4.45 Mil/uL (ref 3.87–5.11)
RDW: 17.1 % — AB (ref 11.5–15.5)
WBC: 3.4 10*3/uL — ABNORMAL LOW (ref 4.0–10.5)

## 2015-03-01 ENCOUNTER — Telehealth: Payer: Self-pay | Admitting: Pulmonary Disease

## 2015-03-01 DIAGNOSIS — D631 Anemia in chronic kidney disease: Secondary | ICD-10-CM | POA: Diagnosis not present

## 2015-03-01 DIAGNOSIS — N186 End stage renal disease: Secondary | ICD-10-CM | POA: Diagnosis not present

## 2015-03-01 DIAGNOSIS — D509 Iron deficiency anemia, unspecified: Secondary | ICD-10-CM | POA: Diagnosis not present

## 2015-03-01 DIAGNOSIS — E119 Type 2 diabetes mellitus without complications: Secondary | ICD-10-CM | POA: Diagnosis not present

## 2015-03-01 DIAGNOSIS — N2581 Secondary hyperparathyroidism of renal origin: Secondary | ICD-10-CM | POA: Diagnosis not present

## 2015-03-01 NOTE — Telephone Encounter (Signed)
Spoke with Thresa Ross at [facility]. She transferred me to Joneen Caraway, a pharmacist. I have given him the verbal to override this. They will ship this medication out to the pt. Nothing further was needed.

## 2015-03-02 DIAGNOSIS — Z992 Dependence on renal dialysis: Secondary | ICD-10-CM | POA: Diagnosis not present

## 2015-03-02 DIAGNOSIS — E1129 Type 2 diabetes mellitus with other diabetic kidney complication: Secondary | ICD-10-CM | POA: Diagnosis not present

## 2015-03-02 DIAGNOSIS — N186 End stage renal disease: Secondary | ICD-10-CM | POA: Diagnosis not present

## 2015-03-03 DIAGNOSIS — E119 Type 2 diabetes mellitus without complications: Secondary | ICD-10-CM | POA: Diagnosis not present

## 2015-03-03 DIAGNOSIS — N186 End stage renal disease: Secondary | ICD-10-CM | POA: Diagnosis not present

## 2015-03-03 DIAGNOSIS — D509 Iron deficiency anemia, unspecified: Secondary | ICD-10-CM | POA: Diagnosis not present

## 2015-03-03 DIAGNOSIS — N2581 Secondary hyperparathyroidism of renal origin: Secondary | ICD-10-CM | POA: Diagnosis not present

## 2015-03-03 DIAGNOSIS — D631 Anemia in chronic kidney disease: Secondary | ICD-10-CM | POA: Diagnosis not present

## 2015-03-05 DIAGNOSIS — D631 Anemia in chronic kidney disease: Secondary | ICD-10-CM | POA: Diagnosis not present

## 2015-03-05 DIAGNOSIS — D509 Iron deficiency anemia, unspecified: Secondary | ICD-10-CM | POA: Diagnosis not present

## 2015-03-05 DIAGNOSIS — E119 Type 2 diabetes mellitus without complications: Secondary | ICD-10-CM | POA: Diagnosis not present

## 2015-03-05 DIAGNOSIS — N186 End stage renal disease: Secondary | ICD-10-CM | POA: Diagnosis not present

## 2015-03-05 DIAGNOSIS — N2581 Secondary hyperparathyroidism of renal origin: Secondary | ICD-10-CM | POA: Diagnosis not present

## 2015-03-08 DIAGNOSIS — N2581 Secondary hyperparathyroidism of renal origin: Secondary | ICD-10-CM | POA: Diagnosis not present

## 2015-03-08 DIAGNOSIS — D509 Iron deficiency anemia, unspecified: Secondary | ICD-10-CM | POA: Diagnosis not present

## 2015-03-08 DIAGNOSIS — D631 Anemia in chronic kidney disease: Secondary | ICD-10-CM | POA: Diagnosis not present

## 2015-03-08 DIAGNOSIS — E119 Type 2 diabetes mellitus without complications: Secondary | ICD-10-CM | POA: Diagnosis not present

## 2015-03-08 DIAGNOSIS — N186 End stage renal disease: Secondary | ICD-10-CM | POA: Diagnosis not present

## 2015-03-10 DIAGNOSIS — D631 Anemia in chronic kidney disease: Secondary | ICD-10-CM | POA: Diagnosis not present

## 2015-03-10 DIAGNOSIS — E119 Type 2 diabetes mellitus without complications: Secondary | ICD-10-CM | POA: Diagnosis not present

## 2015-03-10 DIAGNOSIS — N2581 Secondary hyperparathyroidism of renal origin: Secondary | ICD-10-CM | POA: Diagnosis not present

## 2015-03-10 DIAGNOSIS — D509 Iron deficiency anemia, unspecified: Secondary | ICD-10-CM | POA: Diagnosis not present

## 2015-03-10 DIAGNOSIS — N186 End stage renal disease: Secondary | ICD-10-CM | POA: Diagnosis not present

## 2015-03-12 DIAGNOSIS — D631 Anemia in chronic kidney disease: Secondary | ICD-10-CM | POA: Diagnosis not present

## 2015-03-12 DIAGNOSIS — N2581 Secondary hyperparathyroidism of renal origin: Secondary | ICD-10-CM | POA: Diagnosis not present

## 2015-03-12 DIAGNOSIS — N186 End stage renal disease: Secondary | ICD-10-CM | POA: Diagnosis not present

## 2015-03-12 DIAGNOSIS — E119 Type 2 diabetes mellitus without complications: Secondary | ICD-10-CM | POA: Diagnosis not present

## 2015-03-12 DIAGNOSIS — D509 Iron deficiency anemia, unspecified: Secondary | ICD-10-CM | POA: Diagnosis not present

## 2015-03-15 ENCOUNTER — Telehealth: Payer: Self-pay | Admitting: Pulmonary Disease

## 2015-03-15 DIAGNOSIS — Z5181 Encounter for therapeutic drug level monitoring: Secondary | ICD-10-CM

## 2015-03-15 DIAGNOSIS — N186 End stage renal disease: Secondary | ICD-10-CM | POA: Diagnosis not present

## 2015-03-15 DIAGNOSIS — E119 Type 2 diabetes mellitus without complications: Secondary | ICD-10-CM | POA: Diagnosis not present

## 2015-03-15 DIAGNOSIS — N2581 Secondary hyperparathyroidism of renal origin: Secondary | ICD-10-CM | POA: Diagnosis not present

## 2015-03-15 DIAGNOSIS — D509 Iron deficiency anemia, unspecified: Secondary | ICD-10-CM | POA: Diagnosis not present

## 2015-03-15 DIAGNOSIS — D631 Anemia in chronic kidney disease: Secondary | ICD-10-CM | POA: Diagnosis not present

## 2015-03-15 NOTE — Telephone Encounter (Signed)
Patient has been on the [facility] program since 05/2014, her papers expire on 3/26, needs the renewal form signed and faxed back by the 20th.

## 2015-03-16 NOTE — Telephone Encounter (Signed)
No, this company is just trying to make money off of labs. I want the labs done in the [place] office or else I never see them.  Please make sure she has quarterly LFT's ordered through the [place] office.

## 2015-03-16 NOTE — Telephone Encounter (Signed)
Left message for patient to call back. Placed standing order for labs to be drawn quarterly. Awaiting call back from patient.

## 2015-03-17 DIAGNOSIS — N2581 Secondary hyperparathyroidism of renal origin: Secondary | ICD-10-CM | POA: Diagnosis not present

## 2015-03-17 DIAGNOSIS — E119 Type 2 diabetes mellitus without complications: Secondary | ICD-10-CM | POA: Diagnosis not present

## 2015-03-17 DIAGNOSIS — D509 Iron deficiency anemia, unspecified: Secondary | ICD-10-CM | POA: Diagnosis not present

## 2015-03-17 DIAGNOSIS — D631 Anemia in chronic kidney disease: Secondary | ICD-10-CM | POA: Diagnosis not present

## 2015-03-17 DIAGNOSIS — N186 End stage renal disease: Secondary | ICD-10-CM | POA: Diagnosis not present

## 2015-03-17 NOTE — Telephone Encounter (Signed)
Pt notified  She agrees to come here for labs  Orders already placed

## 2015-03-18 DIAGNOSIS — D2372 Other benign neoplasm of skin of left lower limb, including hip: Secondary | ICD-10-CM | POA: Diagnosis not present

## 2015-03-18 DIAGNOSIS — L84 Corns and callosities: Secondary | ICD-10-CM | POA: Diagnosis not present

## 2015-03-18 DIAGNOSIS — L602 Onychogryphosis: Secondary | ICD-10-CM | POA: Diagnosis not present

## 2015-03-18 DIAGNOSIS — I70293 Other atherosclerosis of native arteries of extremities, bilateral legs: Secondary | ICD-10-CM | POA: Diagnosis not present

## 2015-03-19 DIAGNOSIS — D631 Anemia in chronic kidney disease: Secondary | ICD-10-CM | POA: Diagnosis not present

## 2015-03-19 DIAGNOSIS — N2581 Secondary hyperparathyroidism of renal origin: Secondary | ICD-10-CM | POA: Diagnosis not present

## 2015-03-19 DIAGNOSIS — D509 Iron deficiency anemia, unspecified: Secondary | ICD-10-CM | POA: Diagnosis not present

## 2015-03-19 DIAGNOSIS — E119 Type 2 diabetes mellitus without complications: Secondary | ICD-10-CM | POA: Diagnosis not present

## 2015-03-19 DIAGNOSIS — N186 End stage renal disease: Secondary | ICD-10-CM | POA: Diagnosis not present

## 2015-03-22 ENCOUNTER — Telehealth: Payer: Self-pay | Admitting: Pulmonary Disease

## 2015-03-22 DIAGNOSIS — D509 Iron deficiency anemia, unspecified: Secondary | ICD-10-CM | POA: Diagnosis not present

## 2015-03-22 DIAGNOSIS — E119 Type 2 diabetes mellitus without complications: Secondary | ICD-10-CM | POA: Diagnosis not present

## 2015-03-22 DIAGNOSIS — N2581 Secondary hyperparathyroidism of renal origin: Secondary | ICD-10-CM | POA: Diagnosis not present

## 2015-03-22 DIAGNOSIS — N186 End stage renal disease: Secondary | ICD-10-CM | POA: Diagnosis not present

## 2015-03-22 DIAGNOSIS — D631 Anemia in chronic kidney disease: Secondary | ICD-10-CM | POA: Diagnosis not present

## 2015-03-22 NOTE — Telephone Encounter (Signed)
lmtcb for Theresa. 

## 2015-03-23 NOTE — Telephone Encounter (Signed)
Spoke with [name] and notified per last phone note Dr Lake Bells does not want labs done through the LEAP program  She states will update her information  Nothing further needed

## 2015-03-24 DIAGNOSIS — E119 Type 2 diabetes mellitus without complications: Secondary | ICD-10-CM | POA: Diagnosis not present

## 2015-03-24 DIAGNOSIS — N186 End stage renal disease: Secondary | ICD-10-CM | POA: Diagnosis not present

## 2015-03-24 DIAGNOSIS — N2581 Secondary hyperparathyroidism of renal origin: Secondary | ICD-10-CM | POA: Diagnosis not present

## 2015-03-24 DIAGNOSIS — D631 Anemia in chronic kidney disease: Secondary | ICD-10-CM | POA: Diagnosis not present

## 2015-03-24 DIAGNOSIS — D509 Iron deficiency anemia, unspecified: Secondary | ICD-10-CM | POA: Diagnosis not present

## 2015-03-26 DIAGNOSIS — N186 End stage renal disease: Secondary | ICD-10-CM | POA: Diagnosis not present

## 2015-03-26 DIAGNOSIS — D631 Anemia in chronic kidney disease: Secondary | ICD-10-CM | POA: Diagnosis not present

## 2015-03-26 DIAGNOSIS — E119 Type 2 diabetes mellitus without complications: Secondary | ICD-10-CM | POA: Diagnosis not present

## 2015-03-26 DIAGNOSIS — N2581 Secondary hyperparathyroidism of renal origin: Secondary | ICD-10-CM | POA: Diagnosis not present

## 2015-03-26 DIAGNOSIS — D509 Iron deficiency anemia, unspecified: Secondary | ICD-10-CM | POA: Diagnosis not present

## 2015-03-29 DIAGNOSIS — E119 Type 2 diabetes mellitus without complications: Secondary | ICD-10-CM | POA: Diagnosis not present

## 2015-03-29 DIAGNOSIS — N2581 Secondary hyperparathyroidism of renal origin: Secondary | ICD-10-CM | POA: Diagnosis not present

## 2015-03-29 DIAGNOSIS — N186 End stage renal disease: Secondary | ICD-10-CM | POA: Diagnosis not present

## 2015-03-29 DIAGNOSIS — D509 Iron deficiency anemia, unspecified: Secondary | ICD-10-CM | POA: Diagnosis not present

## 2015-03-29 DIAGNOSIS — D631 Anemia in chronic kidney disease: Secondary | ICD-10-CM | POA: Diagnosis not present

## 2015-03-30 ENCOUNTER — Ambulatory Visit: Payer: Medicare Other | Admitting: Pulmonary Disease

## 2015-03-30 ENCOUNTER — Telehealth: Payer: Self-pay | Admitting: Pulmonary Disease

## 2015-03-30 NOTE — Telephone Encounter (Signed)
OK to overbook?

## 2015-03-30 NOTE — Telephone Encounter (Signed)
Patient states that she has a program that she has to attend on 3/30 from 9am - 1pm and cannot make the appointment with Dr. Lake Bells at 1:30 that day.  She said that she can only come on Tuesdays or Thursdays because she has dialysis the rest of the week.  She wants to know if she can come in to see Dr. Lake Bells later in the evening on 3/30.  I advised her that he does not have any openings in the afternoon.  She wanted me to ask his nurse if there is any way that she can be seen that evening.  Patient says that she will wait by the phone today for a call back to confirm.  Ashley/ Dr. Lake Bells, please advise.

## 2015-03-30 NOTE — Telephone Encounter (Signed)
Patient rescheduled for 04/01/15 at 2:30pm. Patient notified of appointment. Nothing further needed.

## 2015-03-31 DIAGNOSIS — N2581 Secondary hyperparathyroidism of renal origin: Secondary | ICD-10-CM | POA: Diagnosis not present

## 2015-03-31 DIAGNOSIS — D631 Anemia in chronic kidney disease: Secondary | ICD-10-CM | POA: Diagnosis not present

## 2015-03-31 DIAGNOSIS — N186 End stage renal disease: Secondary | ICD-10-CM | POA: Diagnosis not present

## 2015-03-31 DIAGNOSIS — E119 Type 2 diabetes mellitus without complications: Secondary | ICD-10-CM | POA: Diagnosis not present

## 2015-03-31 DIAGNOSIS — D509 Iron deficiency anemia, unspecified: Secondary | ICD-10-CM | POA: Diagnosis not present

## 2015-04-01 ENCOUNTER — Ambulatory Visit: Payer: Medicare Other | Admitting: Pulmonary Disease

## 2015-04-02 DIAGNOSIS — N2581 Secondary hyperparathyroidism of renal origin: Secondary | ICD-10-CM | POA: Diagnosis not present

## 2015-04-02 DIAGNOSIS — E1129 Type 2 diabetes mellitus with other diabetic kidney complication: Secondary | ICD-10-CM | POA: Diagnosis not present

## 2015-04-02 DIAGNOSIS — E119 Type 2 diabetes mellitus without complications: Secondary | ICD-10-CM | POA: Diagnosis not present

## 2015-04-02 DIAGNOSIS — N186 End stage renal disease: Secondary | ICD-10-CM | POA: Diagnosis not present

## 2015-04-02 DIAGNOSIS — Z992 Dependence on renal dialysis: Secondary | ICD-10-CM | POA: Diagnosis not present

## 2015-04-02 DIAGNOSIS — D509 Iron deficiency anemia, unspecified: Secondary | ICD-10-CM | POA: Diagnosis not present

## 2015-04-02 DIAGNOSIS — D631 Anemia in chronic kidney disease: Secondary | ICD-10-CM | POA: Diagnosis not present

## 2015-04-05 DIAGNOSIS — E119 Type 2 diabetes mellitus without complications: Secondary | ICD-10-CM | POA: Diagnosis not present

## 2015-04-05 DIAGNOSIS — D509 Iron deficiency anemia, unspecified: Secondary | ICD-10-CM | POA: Diagnosis not present

## 2015-04-05 DIAGNOSIS — N2581 Secondary hyperparathyroidism of renal origin: Secondary | ICD-10-CM | POA: Diagnosis not present

## 2015-04-05 DIAGNOSIS — D631 Anemia in chronic kidney disease: Secondary | ICD-10-CM | POA: Diagnosis not present

## 2015-04-05 DIAGNOSIS — N186 End stage renal disease: Secondary | ICD-10-CM | POA: Diagnosis not present

## 2015-04-07 DIAGNOSIS — D631 Anemia in chronic kidney disease: Secondary | ICD-10-CM | POA: Diagnosis not present

## 2015-04-07 DIAGNOSIS — D509 Iron deficiency anemia, unspecified: Secondary | ICD-10-CM | POA: Diagnosis not present

## 2015-04-07 DIAGNOSIS — E119 Type 2 diabetes mellitus without complications: Secondary | ICD-10-CM | POA: Diagnosis not present

## 2015-04-07 DIAGNOSIS — N186 End stage renal disease: Secondary | ICD-10-CM | POA: Diagnosis not present

## 2015-04-07 DIAGNOSIS — N2581 Secondary hyperparathyroidism of renal origin: Secondary | ICD-10-CM | POA: Diagnosis not present

## 2015-04-09 DIAGNOSIS — E119 Type 2 diabetes mellitus without complications: Secondary | ICD-10-CM | POA: Diagnosis not present

## 2015-04-09 DIAGNOSIS — D631 Anemia in chronic kidney disease: Secondary | ICD-10-CM | POA: Diagnosis not present

## 2015-04-09 DIAGNOSIS — D509 Iron deficiency anemia, unspecified: Secondary | ICD-10-CM | POA: Diagnosis not present

## 2015-04-09 DIAGNOSIS — N186 End stage renal disease: Secondary | ICD-10-CM | POA: Diagnosis not present

## 2015-04-09 DIAGNOSIS — N2581 Secondary hyperparathyroidism of renal origin: Secondary | ICD-10-CM | POA: Diagnosis not present

## 2015-04-12 DIAGNOSIS — D509 Iron deficiency anemia, unspecified: Secondary | ICD-10-CM | POA: Diagnosis not present

## 2015-04-12 DIAGNOSIS — N186 End stage renal disease: Secondary | ICD-10-CM | POA: Diagnosis not present

## 2015-04-12 DIAGNOSIS — N2581 Secondary hyperparathyroidism of renal origin: Secondary | ICD-10-CM | POA: Diagnosis not present

## 2015-04-12 DIAGNOSIS — D631 Anemia in chronic kidney disease: Secondary | ICD-10-CM | POA: Diagnosis not present

## 2015-04-12 DIAGNOSIS — E119 Type 2 diabetes mellitus without complications: Secondary | ICD-10-CM | POA: Diagnosis not present

## 2015-04-14 DIAGNOSIS — N2581 Secondary hyperparathyroidism of renal origin: Secondary | ICD-10-CM | POA: Diagnosis not present

## 2015-04-14 DIAGNOSIS — E119 Type 2 diabetes mellitus without complications: Secondary | ICD-10-CM | POA: Diagnosis not present

## 2015-04-14 DIAGNOSIS — D631 Anemia in chronic kidney disease: Secondary | ICD-10-CM | POA: Diagnosis not present

## 2015-04-14 DIAGNOSIS — D509 Iron deficiency anemia, unspecified: Secondary | ICD-10-CM | POA: Diagnosis not present

## 2015-04-14 DIAGNOSIS — N186 End stage renal disease: Secondary | ICD-10-CM | POA: Diagnosis not present

## 2015-04-15 DIAGNOSIS — E559 Vitamin D deficiency, unspecified: Secondary | ICD-10-CM | POA: Diagnosis not present

## 2015-04-15 DIAGNOSIS — I130 Hypertensive heart and chronic kidney disease with heart failure and stage 1 through stage 4 chronic kidney disease, or unspecified chronic kidney disease: Secondary | ICD-10-CM | POA: Diagnosis not present

## 2015-04-15 DIAGNOSIS — N186 End stage renal disease: Secondary | ICD-10-CM | POA: Diagnosis not present

## 2015-04-15 DIAGNOSIS — E039 Hypothyroidism, unspecified: Secondary | ICD-10-CM | POA: Diagnosis not present

## 2015-04-15 DIAGNOSIS — I5032 Chronic diastolic (congestive) heart failure: Secondary | ICD-10-CM | POA: Diagnosis not present

## 2015-04-16 DIAGNOSIS — N186 End stage renal disease: Secondary | ICD-10-CM | POA: Diagnosis not present

## 2015-04-16 DIAGNOSIS — E119 Type 2 diabetes mellitus without complications: Secondary | ICD-10-CM | POA: Diagnosis not present

## 2015-04-16 DIAGNOSIS — N2581 Secondary hyperparathyroidism of renal origin: Secondary | ICD-10-CM | POA: Diagnosis not present

## 2015-04-16 DIAGNOSIS — D631 Anemia in chronic kidney disease: Secondary | ICD-10-CM | POA: Diagnosis not present

## 2015-04-16 DIAGNOSIS — D509 Iron deficiency anemia, unspecified: Secondary | ICD-10-CM | POA: Diagnosis not present

## 2015-04-19 DIAGNOSIS — N186 End stage renal disease: Secondary | ICD-10-CM | POA: Diagnosis not present

## 2015-04-19 DIAGNOSIS — D631 Anemia in chronic kidney disease: Secondary | ICD-10-CM | POA: Diagnosis not present

## 2015-04-19 DIAGNOSIS — E119 Type 2 diabetes mellitus without complications: Secondary | ICD-10-CM | POA: Diagnosis not present

## 2015-04-19 DIAGNOSIS — N2581 Secondary hyperparathyroidism of renal origin: Secondary | ICD-10-CM | POA: Diagnosis not present

## 2015-04-19 DIAGNOSIS — D509 Iron deficiency anemia, unspecified: Secondary | ICD-10-CM | POA: Diagnosis not present

## 2015-04-20 DIAGNOSIS — Z992 Dependence on renal dialysis: Secondary | ICD-10-CM | POA: Diagnosis not present

## 2015-04-20 DIAGNOSIS — I871 Compression of vein: Secondary | ICD-10-CM | POA: Diagnosis not present

## 2015-04-20 DIAGNOSIS — N186 End stage renal disease: Secondary | ICD-10-CM | POA: Diagnosis not present

## 2015-04-20 DIAGNOSIS — T82868D Thrombosis of vascular prosthetic devices, implants and grafts, subsequent encounter: Secondary | ICD-10-CM | POA: Diagnosis not present

## 2015-04-21 DIAGNOSIS — N2581 Secondary hyperparathyroidism of renal origin: Secondary | ICD-10-CM | POA: Diagnosis not present

## 2015-04-21 DIAGNOSIS — N186 End stage renal disease: Secondary | ICD-10-CM | POA: Diagnosis not present

## 2015-04-21 DIAGNOSIS — D631 Anemia in chronic kidney disease: Secondary | ICD-10-CM | POA: Diagnosis not present

## 2015-04-21 DIAGNOSIS — D509 Iron deficiency anemia, unspecified: Secondary | ICD-10-CM | POA: Diagnosis not present

## 2015-04-21 DIAGNOSIS — E119 Type 2 diabetes mellitus without complications: Secondary | ICD-10-CM | POA: Diagnosis not present

## 2015-04-22 ENCOUNTER — Ambulatory Visit: Payer: Medicare Other | Admitting: Pulmonary Disease

## 2015-04-23 DIAGNOSIS — D2372 Other benign neoplasm of skin of left lower limb, including hip: Secondary | ICD-10-CM | POA: Diagnosis not present

## 2015-04-23 DIAGNOSIS — N2581 Secondary hyperparathyroidism of renal origin: Secondary | ICD-10-CM | POA: Diagnosis not present

## 2015-04-23 DIAGNOSIS — E119 Type 2 diabetes mellitus without complications: Secondary | ICD-10-CM | POA: Diagnosis not present

## 2015-04-23 DIAGNOSIS — N186 End stage renal disease: Secondary | ICD-10-CM | POA: Diagnosis not present

## 2015-04-23 DIAGNOSIS — D509 Iron deficiency anemia, unspecified: Secondary | ICD-10-CM | POA: Diagnosis not present

## 2015-04-23 DIAGNOSIS — D631 Anemia in chronic kidney disease: Secondary | ICD-10-CM | POA: Diagnosis not present

## 2015-04-26 DIAGNOSIS — D631 Anemia in chronic kidney disease: Secondary | ICD-10-CM | POA: Diagnosis not present

## 2015-04-26 DIAGNOSIS — D509 Iron deficiency anemia, unspecified: Secondary | ICD-10-CM | POA: Diagnosis not present

## 2015-04-26 DIAGNOSIS — N186 End stage renal disease: Secondary | ICD-10-CM | POA: Diagnosis not present

## 2015-04-26 DIAGNOSIS — N2581 Secondary hyperparathyroidism of renal origin: Secondary | ICD-10-CM | POA: Diagnosis not present

## 2015-04-26 DIAGNOSIS — E119 Type 2 diabetes mellitus without complications: Secondary | ICD-10-CM | POA: Diagnosis not present

## 2015-04-28 DIAGNOSIS — N186 End stage renal disease: Secondary | ICD-10-CM | POA: Diagnosis not present

## 2015-04-28 DIAGNOSIS — D509 Iron deficiency anemia, unspecified: Secondary | ICD-10-CM | POA: Diagnosis not present

## 2015-04-28 DIAGNOSIS — N2581 Secondary hyperparathyroidism of renal origin: Secondary | ICD-10-CM | POA: Diagnosis not present

## 2015-04-28 DIAGNOSIS — D631 Anemia in chronic kidney disease: Secondary | ICD-10-CM | POA: Diagnosis not present

## 2015-04-28 DIAGNOSIS — E119 Type 2 diabetes mellitus without complications: Secondary | ICD-10-CM | POA: Diagnosis not present

## 2015-04-29 DIAGNOSIS — N186 End stage renal disease: Secondary | ICD-10-CM | POA: Diagnosis not present

## 2015-04-29 DIAGNOSIS — D509 Iron deficiency anemia, unspecified: Secondary | ICD-10-CM | POA: Diagnosis not present

## 2015-04-29 DIAGNOSIS — D631 Anemia in chronic kidney disease: Secondary | ICD-10-CM | POA: Diagnosis not present

## 2015-04-29 DIAGNOSIS — N2581 Secondary hyperparathyroidism of renal origin: Secondary | ICD-10-CM | POA: Diagnosis not present

## 2015-04-29 DIAGNOSIS — E119 Type 2 diabetes mellitus without complications: Secondary | ICD-10-CM | POA: Diagnosis not present

## 2015-04-30 ENCOUNTER — Encounter: Payer: Self-pay | Admitting: Family

## 2015-04-30 ENCOUNTER — Encounter (HOSPITAL_COMMUNITY): Payer: Medicare Other

## 2015-04-30 ENCOUNTER — Ambulatory Visit: Payer: Medicare Other | Admitting: Family

## 2015-05-02 DIAGNOSIS — N186 End stage renal disease: Secondary | ICD-10-CM | POA: Diagnosis not present

## 2015-05-02 DIAGNOSIS — E1129 Type 2 diabetes mellitus with other diabetic kidney complication: Secondary | ICD-10-CM | POA: Diagnosis not present

## 2015-05-02 DIAGNOSIS — Z992 Dependence on renal dialysis: Secondary | ICD-10-CM | POA: Diagnosis not present

## 2015-05-03 DIAGNOSIS — N186 End stage renal disease: Secondary | ICD-10-CM | POA: Diagnosis not present

## 2015-05-03 DIAGNOSIS — L299 Pruritus, unspecified: Secondary | ICD-10-CM | POA: Diagnosis not present

## 2015-05-03 DIAGNOSIS — T7840XA Allergy, unspecified, initial encounter: Secondary | ICD-10-CM | POA: Diagnosis not present

## 2015-05-03 DIAGNOSIS — Z992 Dependence on renal dialysis: Secondary | ICD-10-CM | POA: Diagnosis not present

## 2015-05-03 DIAGNOSIS — D509 Iron deficiency anemia, unspecified: Secondary | ICD-10-CM | POA: Diagnosis not present

## 2015-05-05 ENCOUNTER — Encounter: Payer: Self-pay | Admitting: Radiology

## 2015-05-05 ENCOUNTER — Other Ambulatory Visit: Payer: Self-pay | Admitting: Radiology

## 2015-05-05 ENCOUNTER — Other Ambulatory Visit (HOSPITAL_COMMUNITY): Payer: Self-pay | Admitting: Interventional Radiology

## 2015-05-05 DIAGNOSIS — Z992 Dependence on renal dialysis: Secondary | ICD-10-CM | POA: Diagnosis not present

## 2015-05-05 DIAGNOSIS — T7840XA Allergy, unspecified, initial encounter: Secondary | ICD-10-CM | POA: Diagnosis not present

## 2015-05-05 DIAGNOSIS — N186 End stage renal disease: Secondary | ICD-10-CM | POA: Diagnosis not present

## 2015-05-05 DIAGNOSIS — D509 Iron deficiency anemia, unspecified: Secondary | ICD-10-CM | POA: Diagnosis not present

## 2015-05-05 DIAGNOSIS — L299 Pruritus, unspecified: Secondary | ICD-10-CM | POA: Diagnosis not present

## 2015-05-05 DIAGNOSIS — N2889 Other specified disorders of kidney and ureter: Secondary | ICD-10-CM

## 2015-05-05 MED ORDER — DIPHENHYDRAMINE HCL 50 MG PO CAPS: 50.0000 mg | ORAL_CAPSULE | Freq: Once | ORAL | Status: DC

## 2015-05-05 MED ORDER — PREDNISONE 50 MG PO TABS: ORAL_TABLET | ORAL | Status: DC

## 2015-05-05 NOTE — Progress Notes (Signed)
Patient scheduled for CT Abd w/ & w/o contrast on 06/08/2015.  Rx phoned to [facility] for 13 hr prep (patient has a Hx of a contrast reaction).  Patient instructed as follows:  Prednisone 50 mg po 13 hrs, 7 hrs & 1 hr prior to CT  Benadryl 50 mg po 1 hr prior to CT.    Lanika Colgate Riki Rusk, RN 05/05/2015 3:14 PM

## 2015-05-07 ENCOUNTER — Ambulatory Visit (HOSPITAL_COMMUNITY): Payer: Medicare Other | Attending: Family

## 2015-05-07 ENCOUNTER — Ambulatory Visit: Payer: Medicare Other | Admitting: Family

## 2015-05-07 DIAGNOSIS — N186 End stage renal disease: Secondary | ICD-10-CM | POA: Diagnosis not present

## 2015-05-07 DIAGNOSIS — Z992 Dependence on renal dialysis: Secondary | ICD-10-CM | POA: Diagnosis not present

## 2015-05-07 DIAGNOSIS — L299 Pruritus, unspecified: Secondary | ICD-10-CM | POA: Diagnosis not present

## 2015-05-07 DIAGNOSIS — D509 Iron deficiency anemia, unspecified: Secondary | ICD-10-CM | POA: Diagnosis not present

## 2015-05-07 DIAGNOSIS — T7840XA Allergy, unspecified, initial encounter: Secondary | ICD-10-CM | POA: Diagnosis not present

## 2015-05-10 ENCOUNTER — Other Ambulatory Visit: Payer: Self-pay | Admitting: Pulmonary Disease

## 2015-05-10 DIAGNOSIS — E119 Type 2 diabetes mellitus without complications: Secondary | ICD-10-CM | POA: Diagnosis not present

## 2015-05-10 DIAGNOSIS — D631 Anemia in chronic kidney disease: Secondary | ICD-10-CM | POA: Diagnosis not present

## 2015-05-10 DIAGNOSIS — N186 End stage renal disease: Secondary | ICD-10-CM | POA: Diagnosis not present

## 2015-05-10 DIAGNOSIS — D509 Iron deficiency anemia, unspecified: Secondary | ICD-10-CM | POA: Diagnosis not present

## 2015-05-10 DIAGNOSIS — N2581 Secondary hyperparathyroidism of renal origin: Secondary | ICD-10-CM | POA: Diagnosis not present

## 2015-05-11 DIAGNOSIS — D2372 Other benign neoplasm of skin of left lower limb, including hip: Secondary | ICD-10-CM | POA: Diagnosis not present

## 2015-05-12 DIAGNOSIS — E119 Type 2 diabetes mellitus without complications: Secondary | ICD-10-CM | POA: Diagnosis not present

## 2015-05-12 DIAGNOSIS — N186 End stage renal disease: Secondary | ICD-10-CM | POA: Diagnosis not present

## 2015-05-12 DIAGNOSIS — D631 Anemia in chronic kidney disease: Secondary | ICD-10-CM | POA: Diagnosis not present

## 2015-05-12 DIAGNOSIS — D509 Iron deficiency anemia, unspecified: Secondary | ICD-10-CM | POA: Diagnosis not present

## 2015-05-12 DIAGNOSIS — N2581 Secondary hyperparathyroidism of renal origin: Secondary | ICD-10-CM | POA: Diagnosis not present

## 2015-05-14 DIAGNOSIS — E119 Type 2 diabetes mellitus without complications: Secondary | ICD-10-CM | POA: Diagnosis not present

## 2015-05-14 DIAGNOSIS — D631 Anemia in chronic kidney disease: Secondary | ICD-10-CM | POA: Diagnosis not present

## 2015-05-14 DIAGNOSIS — N186 End stage renal disease: Secondary | ICD-10-CM | POA: Diagnosis not present

## 2015-05-14 DIAGNOSIS — D509 Iron deficiency anemia, unspecified: Secondary | ICD-10-CM | POA: Diagnosis not present

## 2015-05-14 DIAGNOSIS — N2581 Secondary hyperparathyroidism of renal origin: Secondary | ICD-10-CM | POA: Diagnosis not present

## 2015-05-17 DIAGNOSIS — N186 End stage renal disease: Secondary | ICD-10-CM | POA: Diagnosis not present

## 2015-05-17 DIAGNOSIS — E119 Type 2 diabetes mellitus without complications: Secondary | ICD-10-CM | POA: Diagnosis not present

## 2015-05-17 DIAGNOSIS — N2581 Secondary hyperparathyroidism of renal origin: Secondary | ICD-10-CM | POA: Diagnosis not present

## 2015-05-17 DIAGNOSIS — D631 Anemia in chronic kidney disease: Secondary | ICD-10-CM | POA: Diagnosis not present

## 2015-05-17 DIAGNOSIS — D509 Iron deficiency anemia, unspecified: Secondary | ICD-10-CM | POA: Diagnosis not present

## 2015-05-19 DIAGNOSIS — N186 End stage renal disease: Secondary | ICD-10-CM | POA: Diagnosis not present

## 2015-05-19 DIAGNOSIS — N2581 Secondary hyperparathyroidism of renal origin: Secondary | ICD-10-CM | POA: Diagnosis not present

## 2015-05-19 DIAGNOSIS — E119 Type 2 diabetes mellitus without complications: Secondary | ICD-10-CM | POA: Diagnosis not present

## 2015-05-19 DIAGNOSIS — D631 Anemia in chronic kidney disease: Secondary | ICD-10-CM | POA: Diagnosis not present

## 2015-05-19 DIAGNOSIS — D509 Iron deficiency anemia, unspecified: Secondary | ICD-10-CM | POA: Diagnosis not present

## 2015-05-20 DIAGNOSIS — L602 Onychogryphosis: Secondary | ICD-10-CM | POA: Diagnosis not present

## 2015-05-20 DIAGNOSIS — L84 Corns and callosities: Secondary | ICD-10-CM | POA: Diagnosis not present

## 2015-05-20 DIAGNOSIS — I70293 Other atherosclerosis of native arteries of extremities, bilateral legs: Secondary | ICD-10-CM | POA: Diagnosis not present

## 2015-05-21 DIAGNOSIS — N2581 Secondary hyperparathyroidism of renal origin: Secondary | ICD-10-CM | POA: Diagnosis not present

## 2015-05-21 DIAGNOSIS — D631 Anemia in chronic kidney disease: Secondary | ICD-10-CM | POA: Diagnosis not present

## 2015-05-21 DIAGNOSIS — D509 Iron deficiency anemia, unspecified: Secondary | ICD-10-CM | POA: Diagnosis not present

## 2015-05-21 DIAGNOSIS — N186 End stage renal disease: Secondary | ICD-10-CM | POA: Diagnosis not present

## 2015-05-21 DIAGNOSIS — E119 Type 2 diabetes mellitus without complications: Secondary | ICD-10-CM | POA: Diagnosis not present

## 2015-05-24 DIAGNOSIS — N186 End stage renal disease: Secondary | ICD-10-CM | POA: Diagnosis not present

## 2015-05-24 DIAGNOSIS — D509 Iron deficiency anemia, unspecified: Secondary | ICD-10-CM | POA: Diagnosis not present

## 2015-05-24 DIAGNOSIS — N2581 Secondary hyperparathyroidism of renal origin: Secondary | ICD-10-CM | POA: Diagnosis not present

## 2015-05-24 DIAGNOSIS — E119 Type 2 diabetes mellitus without complications: Secondary | ICD-10-CM | POA: Diagnosis not present

## 2015-05-24 DIAGNOSIS — D631 Anemia in chronic kidney disease: Secondary | ICD-10-CM | POA: Diagnosis not present

## 2015-05-26 DIAGNOSIS — N186 End stage renal disease: Secondary | ICD-10-CM | POA: Diagnosis not present

## 2015-05-26 DIAGNOSIS — D509 Iron deficiency anemia, unspecified: Secondary | ICD-10-CM | POA: Diagnosis not present

## 2015-05-26 DIAGNOSIS — N2581 Secondary hyperparathyroidism of renal origin: Secondary | ICD-10-CM | POA: Diagnosis not present

## 2015-05-26 DIAGNOSIS — D631 Anemia in chronic kidney disease: Secondary | ICD-10-CM | POA: Diagnosis not present

## 2015-05-26 DIAGNOSIS — E119 Type 2 diabetes mellitus without complications: Secondary | ICD-10-CM | POA: Diagnosis not present

## 2015-05-28 DIAGNOSIS — N186 End stage renal disease: Secondary | ICD-10-CM | POA: Diagnosis not present

## 2015-05-28 DIAGNOSIS — E119 Type 2 diabetes mellitus without complications: Secondary | ICD-10-CM | POA: Diagnosis not present

## 2015-05-28 DIAGNOSIS — N2581 Secondary hyperparathyroidism of renal origin: Secondary | ICD-10-CM | POA: Diagnosis not present

## 2015-05-28 DIAGNOSIS — D509 Iron deficiency anemia, unspecified: Secondary | ICD-10-CM | POA: Diagnosis not present

## 2015-05-28 DIAGNOSIS — D631 Anemia in chronic kidney disease: Secondary | ICD-10-CM | POA: Diagnosis not present

## 2015-05-31 DIAGNOSIS — E119 Type 2 diabetes mellitus without complications: Secondary | ICD-10-CM | POA: Diagnosis not present

## 2015-05-31 DIAGNOSIS — N2581 Secondary hyperparathyroidism of renal origin: Secondary | ICD-10-CM | POA: Diagnosis not present

## 2015-05-31 DIAGNOSIS — D509 Iron deficiency anemia, unspecified: Secondary | ICD-10-CM | POA: Diagnosis not present

## 2015-05-31 DIAGNOSIS — N186 End stage renal disease: Secondary | ICD-10-CM | POA: Diagnosis not present

## 2015-05-31 DIAGNOSIS — D631 Anemia in chronic kidney disease: Secondary | ICD-10-CM | POA: Diagnosis not present

## 2015-06-01 ENCOUNTER — Encounter: Payer: Self-pay | Admitting: Pulmonary Disease

## 2015-06-01 ENCOUNTER — Ambulatory Visit (INDEPENDENT_AMBULATORY_CARE_PROVIDER_SITE_OTHER): Payer: Medicare Other | Admitting: Pulmonary Disease

## 2015-06-01 VITALS — BP 134/72 | HR 64 | Ht 62.0 in | Wt 160.0 lb

## 2015-06-01 DIAGNOSIS — J9610 Chronic respiratory failure, unspecified whether with hypoxia or hypercapnia: Secondary | ICD-10-CM | POA: Diagnosis not present

## 2015-06-01 DIAGNOSIS — I70213 Atherosclerosis of native arteries of extremities with intermittent claudication, bilateral legs: Secondary | ICD-10-CM | POA: Diagnosis not present

## 2015-06-01 DIAGNOSIS — I272 Other secondary pulmonary hypertension: Secondary | ICD-10-CM | POA: Diagnosis not present

## 2015-06-01 DIAGNOSIS — F17200 Nicotine dependence, unspecified, uncomplicated: Secondary | ICD-10-CM

## 2015-06-01 NOTE — Assessment & Plan Note (Signed)
Counseled to quit smoking today 

## 2015-06-01 NOTE — Progress Notes (Signed)
Subjective:    Patient ID: [name], female    DOB: 03/04/1945, 70 y.o.   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  PFTs normal.  Active smoker. Secondary Pulmonary hypertension, ESRD, CHF 04/08/2013 full pulmonary function test> ratio 77%, FEV1 1.44 L (80% predicted), total lung capacity 3.55 (72% predicted), ERV 0.23 L (129% predicted), DLCO 10.95 (47% predicted) 05/2013 Echo> LVEF 35-40% (diffuse global hypokinesis), RV severely dilated, RVSP 161mHg, RA dilated 05/2013 6MW> 240 meters, O2 saturaiton 99% exercise 05/2013 CT chest > no ILD, definite heart failure and CAD 1/26 RHC > RA mean 1 RV 52/1 PA 52/20, mean 30 PCWP mean 6  Oxygen saturations: PA 70% AO 95%  Cardiac Output (Fick) 5.77  Cardiac Index (Fick) 3.32 PVR 4.2 WU  Cardiac Output (Thermo) 5.12 Cardiac Index (Thermo) 2.94 PVR 4.7 WU 03/2014 ANA neg, HIV neg, SCL-70 neg, RNP neg, B2 glycoprotein neg, DS DNA neg 05/2014 6MW> 337m (98% RA)  HPI  Chief Complaint  Patient presents with  . Follow-up    pt states she is doing well, no breathing complaints today.     Keyerra missed our last appointment because her twin sister died in 02/07/22.  She has no respiratory complaints.   She continues to smoke cigarettes.   She has not breathing complaints. She is coughing occasionally, no mucus production. She continues to take her Letairis daily.   She has no problems with dialysis.   Past Medical History  Diagnosis Date  . CAD (coronary artery disease)     stent to RCA  . CVA (cerebral infarction) 2003    no apparent residual  . Hypothyroidism   . PVD (peripheral vascular disease) ([place])   . Hyperlipidemia   . Positive PPD     completed rifampin  . Diastolic congestive heart failure ([place])   . Hypertension   . COPD (chronic obstructive pulmonary disease) (H[facility]) t  . Cancer ([place])     clear cell cancer, kidney  . Complication of anesthesia 12/2010    pt is very confused,  with AMS with anesthesia  . CVA 07/27/2008    CVA affected cognition and memory per family, no focal deficits.    . ESRD 07/27/2008    ESRD due to HTN and NSAID's, started hemodialysis in 2005 in [place], [place]. Went to [facility] from 2010 to 2012 and since 2012 has been getting dialysis at [facility] on [address] in [place] on a MWF schedule. First access with RUA AVG placed in [place]. Next and current access was LUA AVG placed by Dr. Lucky Cowboy in [place] in or around 2012. Has had 2 or 3 procedures on that graft since placed per family. She gets her access work done here in [place] now. She is allergic to heparin and does not get any heparin at dialysis; she had an allergic reaction apparently when in ICU in the past.      . Encephalopathy   . Seizures ([place])   . Dyslipidemia   . Chronic renal insufficiency     On hemodialysis  . Arthritis of shoulder     Bilateral  . Depression      Review of Systems  Constitutional: Negative for fever, chills and fatigue.  HENT: Negative for rhinorrhea, sinus pressure and sneezing.   Respiratory: Negative for cough, shortness of breath and wheezing.   Cardiovascular: Negative for chest pain, palpitations and leg swelling.       Objective:  Physical Exam  Filed Vitals:   06/01/15 1617  BP: 134/72  Pulse: 64  Height: 5\' 2"  (1.575 m)  Weight: 160 lb (72.576 kg)  SpO2: 96%   RA  Gen: well appearing HEENT: NCAT, OP clear PULM: CTA B CV: RRR, systolic murmr I/VI RUSB, JVD noted AB: BS+, soft Ext: warm, no edema, clubbing noted Neuro: A&Ox4, maew   05/2013 6MW> 240 meters, O2 saturaiton 99% exercise 01/29/2014 RHC> RA mean 1 RV 52/1 PA 52/20, mean 30 PCWP mean 6  Oxygen saturations: PA 70% AO 95%  Cardiac Output (Fick) 5.77  Cardiac Index (Fick) 3.32 PVR 4.2 WU  Cardiac Output (Thermo) 5.12 Cardiac Index (Thermo) 2.94 PVR 4.7 WU  LFT's from 11/10/2014 normal     Assessment & Plan:   Pulmonary  hypertension (HCC) Reeg remains perfectly asymptomatic despite her moderate to severe pulmonary hypertension. We put her on Letairis a year ago and her 6 minute walk improved and she's had no complaints of shortness of breath since then. Despite her significant smoking history she surprisingly has no evidence of COPD or pulmonary parenchymal problem. Also, despite the fact that she has a slightly depressed left ventricular ejection fraction her pulmonary cardiac wedge pressure was completely normal on her right heart catheterization.  We are treating her as a Lexicographer group 1 pulmonary hypertension patient. I explained to her today that ideally I would like to have her on multiple medications as patients treated with combination therapy have been shown to have a slower progression to more severe outcome such as death, hospitalization, or starting continuous IV infusions of pulmonary vasodilators.  Or somewhat limited because she's not been coming to appointments recently. Further, she says she doesn't want to take any more medications and she's very reluctant to me increasing the dose of Letairis or starting a new agent.  Plan: 6 minute walk this year, if worsening then increase Letairis at least or consider adding a second agent Echocardiogram this year Follow-up 3 months  TOBACCO USER Counseled to quit smoking today.     Updated Medication List Outpatient Encounter Prescriptions as of 06/01/2015  Medication Sig  . acetaminophen (TYLENOL) 500 MG tablet Take 1,000 mg by mouth daily as needed for mild pain.   [name] albuterol (PROVENTIL HFA;VENTOLIN HFA) 108 (90 BASE) MCG/ACT inhaler Inhale 1-2 puffs into the lungs every 6 (six) hours as needed for wheezing or shortness of breath.  [name] amLODipine (NORVASC) 10 MG tablet Take 10 mg by mouth at bedtime.   [name] aspirin EC 81 MG tablet Take 81 mg by mouth daily.  . carvedilol (COREG) 25 MG tablet Take 25 mg by mouth 2 (two) times daily  with a meal.  . diphenhydrAMINE (BENADRYL) 50 MG capsule Take 50 mg by mouth on 12/24/14, at 6:30 am. (Patient taking differently: Take 50 mg by mouth every Monday, Wednesday, and Friday. On dialysis days)  . hydrALAZINE (APRESOLINE) 50 MG tablet Take 50 mg by mouth 3 (three) times daily.  . hydrOXYzine (ATARAX/VISTARIL) 25 MG tablet Take 25 mg by mouth daily as needed for itching. Reported on 12/16/2014  . IOPHEN C-NR 100-10 MG/5ML syrup   . isosorbide mononitrate (IMDUR) 30 MG 24 hr tablet TAKE 1 TABLET (30 MG TOTAL) BY MOUTH DAILY.  [name] LETAIRIS 5 MG tablet TAKE 1 TABLET (5 MG) ORALLY DAILY. DO NOT HANDLE IF PREGNANT. DO NOT SPLIT, CRUSH OR CHEW. AVOID INHALATION AND CONTACT WITH SKIN OR EYE. RE  . levETIRAcetam (KEPPRA) 500 MG tablet Take  500 mg by mouth daily at 8 pm.  . levothyroxine (SYNTHROID, LEVOTHROID) 88 MCG tablet Take 88 mcg by mouth daily before breakfast.  . multivitamin (RENA-VIT) TABS tablet Take 1 tablet by mouth daily.  [name] NITROSTAT 0.4 MG SL tablet Place 0.4 mg under the tongue every 5 (five) minutes as needed for chest pain.   [name] omeprazole (PRILOSEC OTC) 20 MG tablet Take 20 mg by mouth daily.  . pravastatin (PRAVACHOL) 40 MG tablet Take 40 mg by mouth at bedtime.   . predniSONE (DELTASONE) 20 MG tablet   . predniSONE (DELTASONE) 50 MG tablet One tablet (50mg ) 12/23/14 at 6:30 pm; one tablet (50mg ) 12/24/14 at 12:30 am and then one tablet (50 mg) 12/24/14 at 6:30 am. (Patient taking differently: Take 50 mg by mouth once. )  . [START ON 06/07/2015] predniSONE (DELTASONE) 50 MG tablet Prednisone 50 mg po 13 hrs, 7 hrs and 1 hr prior to CT (scheduled for 06/08/2015 at 8am)  . SENSIPAR 90 MG tablet Take 90 mg by mouth at bedtime.   . sevelamer carbonate (RENVELA) 800 MG tablet Take 1,600-3,200 mg by mouth 3 (three) times daily. 4 tabs TID, 2 tabs with each snack  . traMADol (ULTRAM) 50 MG tablet Take 50 mg by mouth daily as needed for moderate pain.   . traZODone (DESYREL) 50 MG tablet  Take 50 mg by mouth at bedtime as needed for sleep.    No facility-administered encounter medications on file as of 06/01/2015.

## 2015-06-01 NOTE — Assessment & Plan Note (Signed)
[name] remains perfectly asymptomatic despite her moderate to severe pulmonary hypertension. We put her on Letairis a year ago and her 6 minute walk improved and she's had no complaints of shortness of breath since then. Despite her significant smoking history she surprisingly has no evidence of COPD or pulmonary parenchymal problem. Also, despite the fact that she has a slightly depressed left ventricular ejection fraction her pulmonary cardiac wedge pressure was completely normal on her right heart catheterization.  We are treating her as a Lexicographer group 1 pulmonary hypertension patient. I explained to her today that ideally I would like to have her on multiple medications as patients treated with combination therapy have been shown to have a slower progression to more severe outcome such as death, hospitalization, or starting continuous IV infusions of pulmonary vasodilators.  Or somewhat limited because she's not been coming to appointments recently. Further, she says she doesn't want to take any more medications and she's very reluctant to me increasing the dose of Letairis or starting a new agent.  Plan: 6 minute walk this year, if worsening then increase Letairis at least or consider adding a second agent Echocardiogram this year Follow-up 3 months

## 2015-06-01 NOTE — Patient Instructions (Signed)
We will prescribe a walker for you Stop smoking Keep taking [name] We will schedule a 6 minute walk and an echocardiogram between now and the next visit We will see you back in 3 months or sooner if needed

## 2015-06-02 DIAGNOSIS — E1129 Type 2 diabetes mellitus with other diabetic kidney complication: Secondary | ICD-10-CM | POA: Diagnosis not present

## 2015-06-02 DIAGNOSIS — N2581 Secondary hyperparathyroidism of renal origin: Secondary | ICD-10-CM | POA: Diagnosis not present

## 2015-06-02 DIAGNOSIS — D631 Anemia in chronic kidney disease: Secondary | ICD-10-CM | POA: Diagnosis not present

## 2015-06-02 DIAGNOSIS — N186 End stage renal disease: Secondary | ICD-10-CM | POA: Diagnosis not present

## 2015-06-02 DIAGNOSIS — D509 Iron deficiency anemia, unspecified: Secondary | ICD-10-CM | POA: Diagnosis not present

## 2015-06-02 DIAGNOSIS — E119 Type 2 diabetes mellitus without complications: Secondary | ICD-10-CM | POA: Diagnosis not present

## 2015-06-02 DIAGNOSIS — Z992 Dependence on renal dialysis: Secondary | ICD-10-CM | POA: Diagnosis not present

## 2015-06-04 DIAGNOSIS — D509 Iron deficiency anemia, unspecified: Secondary | ICD-10-CM | POA: Diagnosis not present

## 2015-06-04 DIAGNOSIS — E119 Type 2 diabetes mellitus without complications: Secondary | ICD-10-CM | POA: Diagnosis not present

## 2015-06-04 DIAGNOSIS — D631 Anemia in chronic kidney disease: Secondary | ICD-10-CM | POA: Diagnosis not present

## 2015-06-04 DIAGNOSIS — N289 Disorder of kidney and ureter, unspecified: Secondary | ICD-10-CM | POA: Diagnosis not present

## 2015-06-04 DIAGNOSIS — N2889 Other specified disorders of kidney and ureter: Secondary | ICD-10-CM | POA: Diagnosis not present

## 2015-06-04 DIAGNOSIS — N2581 Secondary hyperparathyroidism of renal origin: Secondary | ICD-10-CM | POA: Diagnosis not present

## 2015-06-04 DIAGNOSIS — N186 End stage renal disease: Secondary | ICD-10-CM | POA: Diagnosis not present

## 2015-06-05 LAB — CREATININE WITH EST GFR
Creat: 4.16 mg/dL — ABNORMAL HIGH (ref 0.60–0.93)
GFR, Est African American: 12 mL/min — ABNORMAL LOW (ref >=60)
GFR, Est Non African American: 10 mL/min — ABNORMAL LOW (ref >=60)

## 2015-06-05 LAB — BUN: BUN: 17 mg/dL (ref 7–25)

## 2015-06-07 DIAGNOSIS — N186 End stage renal disease: Secondary | ICD-10-CM | POA: Diagnosis not present

## 2015-06-07 DIAGNOSIS — D631 Anemia in chronic kidney disease: Secondary | ICD-10-CM | POA: Diagnosis not present

## 2015-06-07 DIAGNOSIS — N2581 Secondary hyperparathyroidism of renal origin: Secondary | ICD-10-CM | POA: Diagnosis not present

## 2015-06-07 DIAGNOSIS — E119 Type 2 diabetes mellitus without complications: Secondary | ICD-10-CM | POA: Diagnosis not present

## 2015-06-07 DIAGNOSIS — D509 Iron deficiency anemia, unspecified: Secondary | ICD-10-CM | POA: Diagnosis not present

## 2015-06-08 ENCOUNTER — Ambulatory Visit
Admission: RE | Admit: 2015-06-08 | Discharge: 2015-06-08 | Disposition: A | Discharge status: Home / Self Care | Payer: Medicare Other | Source: Ambulatory Visit | Attending: Interventional Radiology | Admitting: Interventional Radiology

## 2015-06-08 ENCOUNTER — Encounter (HOSPITAL_COMMUNITY): Payer: Self-pay

## 2015-06-08 ENCOUNTER — Ambulatory Visit (HOSPITAL_COMMUNITY)
Admission: RE | Admit: 2015-06-08 | Discharge: 2015-06-08 | Disposition: A | Discharge status: Home / Self Care | Payer: Medicare Other | Source: Ambulatory Visit | Attending: Interventional Radiology | Admitting: Interventional Radiology

## 2015-06-08 DIAGNOSIS — Z905 Acquired absence of kidney: Secondary | ICD-10-CM | POA: Diagnosis not present

## 2015-06-08 DIAGNOSIS — C642 Malignant neoplasm of left kidney, except renal pelvis: Secondary | ICD-10-CM | POA: Diagnosis not present

## 2015-06-08 DIAGNOSIS — I517 Cardiomegaly: Secondary | ICD-10-CM | POA: Insufficient documentation

## 2015-06-08 DIAGNOSIS — J811 Chronic pulmonary edema: Secondary | ICD-10-CM | POA: Diagnosis not present

## 2015-06-08 DIAGNOSIS — I2510 Atherosclerotic heart disease of native coronary artery without angina pectoris: Secondary | ICD-10-CM | POA: Diagnosis not present

## 2015-06-08 DIAGNOSIS — N2889 Other specified disorders of kidney and ureter: Secondary | ICD-10-CM | POA: Diagnosis not present

## 2015-06-08 MED ORDER — IOPAMIDOL (ISOVUE-300) INJECTION 61%
100.0000 mL | Freq: Once | INTRAVENOUS | Status: AC | PRN
Start: 2015-06-08 — End: 2015-06-08
  Administered 2015-06-08: 100 mL via INTRAVENOUS

## 2015-06-08 NOTE — Progress Notes (Signed)
Chief Complaint: Surveillance of the cystic lesions of the right kidney. Status post prior left nephrectomy for renal carcinoma.  History of Present Illness: [name] is a 70 y.o. female who returns for follow-up imaging of complex cystic lesions of the right kidney which have been stable since 2013 by imaging. She remains on hemodialysis and currently is asymptomatic.  Past Medical History  Diagnosis Date  . CAD (coronary artery disease)     stent to RCA  . CVA (cerebral infarction) 2003    no apparent residual  . Hypothyroidism   . PVD (peripheral vascular disease) ([place])   . Hyperlipidemia   . Positive PPD     completed rifampin  . Diastolic congestive heart failure ([place])   . Hypertension   . COPD (chronic obstructive pulmonary disease) (H[facility]) t  . Cancer ([place])     clear cell cancer, kidney  . Complication of anesthesia 12/2010    pt is very confused, with AMS with anesthesia  . CVA 07/27/2008    CVA affected cognition and memory per family, no focal deficits.    . ESRD 07/27/2008    ESRD due to HTN and NSAID's, started hemodialysis in 2005 in [place], [place]. Went to [facility] from 2010 to 2012 and since 2012 has been getting dialysis at [facility] on [address] in [place] on a MWF schedule. First access with RUA AVG placed in [place]. Next and current access was LUA AVG placed by Dr. Lucky Cowboy in [place] in or around 2012. Has had 2 or 3 procedures on that graft since placed per family. She gets her access work done here in [facility] now. She is allergic to heparin and does not get any heparin at dialysis; she had an allergic reaction apparently when in ICU in the past.      . Encephalopathy   . Seizures ([place])   . Dyslipidemia   . Chronic renal insufficiency     On hemodialysis  . Arthritis of shoulder     Bilateral  . Depression     Past Surgical History  Procedure Laterality Date  . Abdominal hysterectomy    . Cholecystectomy    . D&cs    .  Right ankle repair    . Left nephrectomy    . Vascular surgery  11/2010    graft inserted to left arm  . Nephrectomy Left     Malignant tumor  . Cataract extraction Bilateral   . Tonsillectomy    . Cardiac catheterization    . Left heart catheterization with coronary angiogram N/A 02/22/2011    Procedure: LEFT HEART CATHETERIZATION WITH CORONARY ANGIOGRAM;  Surgeon: Wellington Hampshire, MD;  Location: [facility] CATH LAB;  Service: Cardiovascular;  Laterality: N/A;  . Right heart catheterization N/A 01/29/2014    Procedure: RIGHT HEART CATH;  Surgeon: Larey Dresser, MD;  Location: [facility] C[facility] LAB;  Service: Cardiovascular;  Laterality: N/A;  . Peripheral vascular catheterization N/A 12/31/2014    Procedure: Abdominal Aortogram;  Surgeon: Conrad Trappe, MD;  Location: [facility] CV LAB;  Service: Cardiovascular;  Laterality: N/A;    Allergies: Ace inhibitors; Heparin; Iohexol; Phenytoin; Phenytoin sodium extended; Wellbutrin; Ampicillin; Meperidine hcl; Morphine; Penicillins; Pentazocine; Valproic acid and related; Iodinated diagnostic agents; and Pentazocine lactate  Medications: Prior to Admission medications   Medication Sig Start Date End Date Taking? Authorizing Provider  acetaminophen (TYLENOL) 500 MG tablet Take 1,000 mg by mouth daily as needed for mild pain.     Historical  Provider, MD  albuterol (PROVENTIL HFA;VENTOLIN HFA) 108 (90 BASE) MCG/ACT inhaler Inhale 1-2 puffs into the lungs every 6 (six) hours as needed for wheezing or shortness of breath. 10/06/13   Linton Flemings, MD  amLODipine (NORVASC) 10 MG tablet Take 10 mg by mouth at bedtime.     Historical Provider, MD  aspirin EC 81 MG tablet Take 81 mg by mouth daily.    Historical Provider, MD  carvedilol (COREG) 25 MG tablet Take 25 mg by mouth 2 (two) times daily with a meal.    Historical Provider, MD  diphenhydrAMINE (BENADRYL) 50 MG capsule Take 50 mg by mouth on 12/24/14, at 6:30 am. Patient taking differently: Take 50 mg by mouth  every Monday, Wednesday, and Friday. On dialysis days 12/24/14   Angelia Mould, MD  hydrALAZINE (APRESOLINE) 50 MG tablet Take 50 mg by mouth 3 (three) times daily.    Historical Provider, MD  hydrOXYzine (ATARAX/VISTARIL) 25 MG tablet Take 25 mg by mouth daily as needed for itching. Reported on 12/16/2014 03/18/14   Historical Provider, MD  Shela Leff C-NR 100-10 MG/5ML syrup  01/13/15   Historical Provider, MD  isosorbide mononitrate (IMDUR) 30 MG 24 hr tablet TAKE 1 TABLET (30 MG TOTAL) BY MOUTH DAILY. 06/18/14   Minus Breeding, MD  LETAIRIS 5 MG tablet TAKE 1 TABLET (5 MG) ORALLY DAILY. DO NOT HANDLE IF PREGNANT. DO NOT SPLIT, CRUSH OR CHEW. AVOID INHALATION AND CONTACT WITH SKIN OR EYE. RE 05/10/15   Juanito Doom, MD  levETIRAcetam (KEPPRA) 500 MG tablet Take 500 mg by mouth daily at 8 pm.    Historical Provider, MD  levothyroxine (SYNTHROID, LEVOTHROID) 88 MCG tablet Take 88 mcg by mouth daily before breakfast.    Historical Provider, MD  multivitamin (RENA-VIT) TABS tablet Take 1 tablet by mouth daily.    Historical Provider, MD  NITROSTAT 0.4 MG SL tablet Place 0.4 mg under the tongue every 5 (five) minutes as needed for chest pain.  09/16/10   Historical Provider, MD  omeprazole (PRILOSEC OTC) 20 MG tablet Take 20 mg by mouth daily.    Historical Provider, MD  pravastatin (PRAVACHOL) 40 MG tablet Take 40 mg by mouth at bedtime.     Historical Provider, MD  predniSONE (DELTASONE) 20 MG tablet  01/25/15   Historical Provider, MD  predniSONE (DELTASONE) 50 MG tablet One tablet (70m) 12/23/14 at 6:30 pm; one tablet (514m 12/24/14 at 12:30 am and then one tablet (50 mg) 12/24/14 at 6:30 am. Patient taking differently: Take 50 mg by mouth once.  12/23/14   ChAngelia MouldMD  predniSONE (DELTASONE) 50 MG tablet Prednisone 50 mg po 13 hrs, 7 hrs and 1 hr prior to CT (scheduled for 06/08/2015 at 8am) 06/07/15   GlAletta EdouardMD  SENSIPAR 90 MG tablet Take 90 mg by mouth at bedtime.  03/05/14    Historical Provider, MD  sevelamer carbonate (RENVELA) 800 MG tablet Take 1,600-3,200 mg by mouth 3 (three) times daily. 4 tabs TID, 2 tabs with each snack    Historical Provider, MD  traMADol (ULTRAM) 50 MG tablet Take 50 mg by mouth daily as needed for moderate pain.  04/05/14   Historical Provider, MD  traZODone (DESYREL) 50 MG tablet Take 50 mg by mouth at bedtime as needed for sleep.     Historical Provider, MD     Family History  Problem Relation Age of Onset  . Heart disease Father   . Hypertension Mother   .  Dementia Mother   . Coronary artery disease Sister   . Heart attack Sister   . Hypertension Brother     Social History   Social History  . Marital Status: Divorced    Spouse Name: N/A  . Number of Children: 2  . Years of Education: 16   Occupational History  . Retired Marine scientist   .     Social History Main Topics  . Smoking status: Current Every Day Smoker -- 1.00 packs/day for 53 years    Types: Cigarettes  . Smokeless tobacco: Never Used     Comment: down to .5-.75 ppd 06/01/15  . Alcohol Use: 0.0 oz/week    0 Standard drinks or equivalent per week     Comment: very rarely per pt  . Drug Use: No     Comment: former marijuana use, several years  . Sexual Activity: Not Currently    Birth Control/ Protection: Post-menopausal   Other Topics Concern  . Not on file   Social History Narrative    ECOG Status: 0 - Asymptomatic  Review of Systems: A 12 point ROS discussed and pertinent positives are indicated in the HPI above.  All other systems are negative.  Review of Systems  Constitutional: Negative.   Respiratory: Negative.   Cardiovascular: Negative.   Gastrointestinal: Negative.   Genitourinary: Negative.   Musculoskeletal: Negative.   Neurological: Negative.      Vital Signs: BP 193/93 mmHg  Pulse 71  Temp(Src) 98.1 F (36.7 C) (Oral)  Resp 14  Ht 5' 2.5" (1.588 m)  Wt 160 lb (72.576 kg)  BMI 28.78 kg/m2  SpO2 100%  Physical Exam    Constitutional: She is oriented to person, place, and time. No distress.  Abdominal: Soft. She exhibits no distension. There is no tenderness. There is no rebound and no guarding.  Musculoskeletal: She exhibits no edema.  Neurological: She is alert and oriented to person, place, and time.  Skin: She is not diaphoretic.  Nursing note and vitals reviewed.   Imaging: Ct Abd Wo & W Cm  06/08/2015  CLINICAL DATA:  70 year old female with history of prior a left-sided renal cell carcinoma status post left nephrectomy. Follow-up for prior cryoablation. EXAM: CT ABDOMEN WITHOUT AND WITH CONTRAST TECHNIQUE: Multidetector CT imaging of the abdomen was performed following the standard protocol before and following the bolus administration of intravenous contrast. CONTRAST:  176m ISOVUE-300 IOPAMIDOL (ISOVUE-300) INJECTION 61% COMPARISON:  CT the abdomen 03/18/2013. FINDINGS: Lower chest: Cardiomegaly. Severe mitral annular calcifications. Atherosclerotic calcifications in the right coronary artery. Scarring in the left lung base. Subtle ground-glass attenuation and interlobular septal thickening throughout the visualize lung bases, which may suggest a background of mild pulmonary edema. Probable air trapping in the right lower lobe. Trace amount of pericardial fluid and/or thickening, unlikely to be of hemodynamic significance at this time. Hepatobiliary: No cystic or solid hepatic lesions. No intra or extrahepatic biliary ductal dilatation. Gallbladder is not visualized and likely surgically absent (no surgical clips are noted in the gallbladder fossa). Pancreas: No pancreatic mass. No pancreatic ductal dilatation. No pancreatic or peripancreatic fluid or inflammatory changes. Spleen: Unremarkable. Adrenals/Urinary Tract: Status post left nephrectomy. Right kidney is again severely atrophic with multiple cystic lesions of varying degrees of complexity. The majority of these are intermediate attenuation and do not  enhance, compatible with proteinaceous/hemorrhagic cysts, largest of which is exophytic in the interpolar region measuring up to 3.2 cm in diameter. In addition, in the upper pole there are several lesions  which have irregular calcifications in their walls, but generally demonstrate no definite enhancing soft tissue. The exception to this is a 2.0 x 1.5 cm lesion in the upper pole (image 35 of series 4) which appears to have a tiny internal focus of nodular enhancing soft tissue (slightly compromises assessment secondary to extensive respiratory motion on the noncontrast images), which is similar in size to the prior study. In addition, there is an increasingly apparent fatty attenuation lesion which appears to emanate from the lower pole of the right kidney, and fills much of the infrarenal right-sided pararenal space, measuring up to 11.0 x 7.9 x 9.4 cm (axial image 61 of series 2 and coronal image 63 of series 604), concerning for a very large renal angiomyolipoma (predominantly fatty). No hydroureteronephrosis in the visualized abdomen. Right adrenal gland is unremarkable in appearance. Left adrenal gland is not confidently visualized, and may be surgically absent. Stomach/Bowel: The appearance of the stomach is normal. There is no pathologic dilatation of visualized small bowel or colon. Vascular/Lymphatic: Extensive atherosclerosis throughout the abdominal vasculature, without evidence of aneurysm or dissection. No lymphadenopathy noted in the abdomen. Other: No significant volume of ascites and no pneumoperitoneum in the visualized peritoneal cavity. Musculoskeletal: There are no aggressive appearing lytic or blastic lesions noted in the visualized portions of the skeleton. Diffuse sclerosis throughout the visualized axial skeleton, presumably from renal osteodystrophy. IMPRESSION: 1. While the majority of the right-sided renal lesions appear essentially unchanged compared to the prior study, compatible with  cysts of varying degrees of complexity, there is an increasingly apparent fatty attenuation lesion extending off the lower pole the right kidney which measures up to 11.0 x 7.9 x 9.4 cm, likely to represent a predominantly fatty renal angiomyolipoma. 2. Status post left radical nephrectomy with no findings to suggest local recurrence of disease or definite metastatic disease in the abdomen. 3. Extensive atherosclerosis, including right coronary artery disease. 4. Cardiomegaly with evidence of mild interstitial pulmonary edema in the lung bases, concerning for underlying congestive heart failure. 5. Trace amount of pericardial fluid and/or thickening, similar to prior studies, unlikely to be of hemodynamic significance at this time. These results were called by telephone at the time of interpretation on 06/08/2015 at 9:24 am to Dr. Aletta Edouard, who verbally acknowledged these results. Electronically Signed   By: Vinnie Langton M.D.   On: 06/08/2015 09:25    Labs:  CBC:  Recent Labs  09/03/14 1140 09/23/14 0745 11/10/14 1539 12/31/14 0932 02/26/15 1447  WBC 5.0 6.3 7.1  --  3.4*  HGB 10.2* 11.2* 12.9 12.9 11.9*  HCT 31.6* 34.9* 40.5 38.0 35.8*  PLT 155 177 183.0  --  194.0    COAGS: No results for input(s): INR, APTT in the last 8760 hours.  BMP:  Recent Labs  09/03/14 0640 09/03/14 1140 09/23/14 0745 12/31/14 0932 02/12/15 1152 06/04/15 1654  NA 137 137 136 140 139  --   K 5.4* 4.4 5.5* 3.7 4.7  --   CL 97* 97* 97* 95* 96*  --   CO2 _0 --  36*  --   GLUCOSE 58* 99 80 145* 87  --   BUN 53* 15 30* _1 CALCIUM 9.3 8.6* 9.3  --  8.7  --   CREATININE 12.09* 5.66* 9.12* 5.00* 3.86* 4.16*  GFRNONAA 3* 7* 4*  --  11* 10*  GFRAA 3* 8* 4*  --  13* 12*    LIVER FUNCTION TESTS:  Recent  Labs  09/03/14 0031  09/03/14 0640 09/03/14 1140 11/10/14 0958 02/12/15 1152  BILITOT 0.7  --   --  0.9 0.6 0.7  0.7  AST 15  --   --  _0 ALT 12*  --   --  11* _1 ALKPHOS 97  --   --  98 81 60  59  PROT 6.2*  --   --  6.4* 6.8 6.5  7.0  ALBUMIN 3.2*  < > 3.0* 3.0* 3.9 3.7  3.9  < > = values in this interval not displayed.   Assessment and Plan:  I met with Mrs. Averitt and reviewed follow-up CT of the abdomen with her from earlier today. This is compared to the most recent scan dated 03/18/2013. The complex cystic lesions throughout much of the right kidney remain stable with no evidence of progressive enlargement or enhancement to suggest renal carcinoma. Now evident on the current study is a more obvious lesion emanating from the lower pole of the right kidney of almost entirely fat density that demonstrates some internal vascularity and mild enhancement.  Imaging findings are consistent with angiomyolipoma. This lesion does show significant enlargement over time with current maximal dimensions of likely nearly 11 cm. There is no evidence of associated hemorrhage.  I discussed treatment options for the enlarging angiomyolipoma with Mrs. Perleberg. Surgical resection may be difficult given size and the fact that it occupies much of the inferior right retroperitoneum below the right kidney. Percutaneous transcatheter embolization of arterial supply can be a very effective way of shrinking the lesion and decreasing the risk of spontaneous hemorrhage over time. I discussed details of catheter embolization with the patient.  Mrs. Deans feels very well currently and has absolutely no symptoms referrable to the enlarging AML. She would like to hold off on any intervention currently. The complex cystic lesions of the right kidney no longer require routine follow-up as they have been stable over 4 years. I have recommended a follow-up CT in 12 months to follow the enlarging AML. I told Mrs. Boschert to certainly call if she begins to experience any right flank pain and also go to an emergency department if she develops any acute severe right flank  pain.   Electronically SignedAletta Edouard T 06/08/2015, 12:49 PM   I spent a total of 15 Minutes in face to face in clinical consultation, greater than 50% of which was counseling/coordinating care for follow up of right renal lesions.

## 2015-06-10 DIAGNOSIS — D509 Iron deficiency anemia, unspecified: Secondary | ICD-10-CM | POA: Diagnosis not present

## 2015-06-10 DIAGNOSIS — N2581 Secondary hyperparathyroidism of renal origin: Secondary | ICD-10-CM | POA: Diagnosis not present

## 2015-06-10 DIAGNOSIS — N186 End stage renal disease: Secondary | ICD-10-CM | POA: Diagnosis not present

## 2015-06-10 DIAGNOSIS — D631 Anemia in chronic kidney disease: Secondary | ICD-10-CM | POA: Diagnosis not present

## 2015-06-10 DIAGNOSIS — E119 Type 2 diabetes mellitus without complications: Secondary | ICD-10-CM | POA: Diagnosis not present

## 2015-06-11 DIAGNOSIS — D631 Anemia in chronic kidney disease: Secondary | ICD-10-CM | POA: Diagnosis not present

## 2015-06-11 DIAGNOSIS — E119 Type 2 diabetes mellitus without complications: Secondary | ICD-10-CM | POA: Diagnosis not present

## 2015-06-11 DIAGNOSIS — N186 End stage renal disease: Secondary | ICD-10-CM | POA: Diagnosis not present

## 2015-06-11 DIAGNOSIS — N2581 Secondary hyperparathyroidism of renal origin: Secondary | ICD-10-CM | POA: Diagnosis not present

## 2015-06-11 DIAGNOSIS — D509 Iron deficiency anemia, unspecified: Secondary | ICD-10-CM | POA: Diagnosis not present

## 2015-06-14 DIAGNOSIS — N2581 Secondary hyperparathyroidism of renal origin: Secondary | ICD-10-CM | POA: Diagnosis not present

## 2015-06-14 DIAGNOSIS — N186 End stage renal disease: Secondary | ICD-10-CM | POA: Diagnosis not present

## 2015-06-14 DIAGNOSIS — D509 Iron deficiency anemia, unspecified: Secondary | ICD-10-CM | POA: Diagnosis not present

## 2015-06-14 DIAGNOSIS — E119 Type 2 diabetes mellitus without complications: Secondary | ICD-10-CM | POA: Diagnosis not present

## 2015-06-14 DIAGNOSIS — D631 Anemia in chronic kidney disease: Secondary | ICD-10-CM | POA: Diagnosis not present

## 2015-06-16 DIAGNOSIS — N186 End stage renal disease: Secondary | ICD-10-CM | POA: Diagnosis not present

## 2015-06-16 DIAGNOSIS — D631 Anemia in chronic kidney disease: Secondary | ICD-10-CM | POA: Diagnosis not present

## 2015-06-16 DIAGNOSIS — N2581 Secondary hyperparathyroidism of renal origin: Secondary | ICD-10-CM | POA: Diagnosis not present

## 2015-06-16 DIAGNOSIS — E119 Type 2 diabetes mellitus without complications: Secondary | ICD-10-CM | POA: Diagnosis not present

## 2015-06-16 DIAGNOSIS — D509 Iron deficiency anemia, unspecified: Secondary | ICD-10-CM | POA: Diagnosis not present

## 2015-06-18 DIAGNOSIS — N2581 Secondary hyperparathyroidism of renal origin: Secondary | ICD-10-CM | POA: Diagnosis not present

## 2015-06-18 DIAGNOSIS — D509 Iron deficiency anemia, unspecified: Secondary | ICD-10-CM | POA: Diagnosis not present

## 2015-06-18 DIAGNOSIS — N186 End stage renal disease: Secondary | ICD-10-CM | POA: Diagnosis not present

## 2015-06-18 DIAGNOSIS — D631 Anemia in chronic kidney disease: Secondary | ICD-10-CM | POA: Diagnosis not present

## 2015-06-18 DIAGNOSIS — E119 Type 2 diabetes mellitus without complications: Secondary | ICD-10-CM | POA: Diagnosis not present

## 2015-06-21 DIAGNOSIS — S91301A Unspecified open wound, right foot, initial encounter: Secondary | ICD-10-CM | POA: Diagnosis not present

## 2015-06-21 DIAGNOSIS — N186 End stage renal disease: Secondary | ICD-10-CM | POA: Diagnosis not present

## 2015-06-21 DIAGNOSIS — N2581 Secondary hyperparathyroidism of renal origin: Secondary | ICD-10-CM | POA: Diagnosis not present

## 2015-06-21 DIAGNOSIS — E119 Type 2 diabetes mellitus without complications: Secondary | ICD-10-CM | POA: Diagnosis not present

## 2015-06-21 DIAGNOSIS — D631 Anemia in chronic kidney disease: Secondary | ICD-10-CM | POA: Diagnosis not present

## 2015-06-21 DIAGNOSIS — D509 Iron deficiency anemia, unspecified: Secondary | ICD-10-CM | POA: Diagnosis not present

## 2015-06-23 DIAGNOSIS — D631 Anemia in chronic kidney disease: Secondary | ICD-10-CM | POA: Diagnosis not present

## 2015-06-23 DIAGNOSIS — E119 Type 2 diabetes mellitus without complications: Secondary | ICD-10-CM | POA: Diagnosis not present

## 2015-06-23 DIAGNOSIS — N186 End stage renal disease: Secondary | ICD-10-CM | POA: Diagnosis not present

## 2015-06-23 DIAGNOSIS — D509 Iron deficiency anemia, unspecified: Secondary | ICD-10-CM | POA: Diagnosis not present

## 2015-06-23 DIAGNOSIS — N2581 Secondary hyperparathyroidism of renal origin: Secondary | ICD-10-CM | POA: Diagnosis not present

## 2015-06-25 DIAGNOSIS — E119 Type 2 diabetes mellitus without complications: Secondary | ICD-10-CM | POA: Diagnosis not present

## 2015-06-25 DIAGNOSIS — N186 End stage renal disease: Secondary | ICD-10-CM | POA: Diagnosis not present

## 2015-06-25 DIAGNOSIS — D631 Anemia in chronic kidney disease: Secondary | ICD-10-CM | POA: Diagnosis not present

## 2015-06-25 DIAGNOSIS — N2581 Secondary hyperparathyroidism of renal origin: Secondary | ICD-10-CM | POA: Diagnosis not present

## 2015-06-25 DIAGNOSIS — D509 Iron deficiency anemia, unspecified: Secondary | ICD-10-CM | POA: Diagnosis not present

## 2015-06-28 DIAGNOSIS — D509 Iron deficiency anemia, unspecified: Secondary | ICD-10-CM | POA: Diagnosis not present

## 2015-06-28 DIAGNOSIS — N186 End stage renal disease: Secondary | ICD-10-CM | POA: Diagnosis not present

## 2015-06-28 DIAGNOSIS — E119 Type 2 diabetes mellitus without complications: Secondary | ICD-10-CM | POA: Diagnosis not present

## 2015-06-28 DIAGNOSIS — N2581 Secondary hyperparathyroidism of renal origin: Secondary | ICD-10-CM | POA: Diagnosis not present

## 2015-06-28 DIAGNOSIS — D631 Anemia in chronic kidney disease: Secondary | ICD-10-CM | POA: Diagnosis not present

## 2015-06-30 ENCOUNTER — Other Ambulatory Visit: Payer: Self-pay | Admitting: Cardiology

## 2015-06-30 DIAGNOSIS — E119 Type 2 diabetes mellitus without complications: Secondary | ICD-10-CM | POA: Diagnosis not present

## 2015-06-30 DIAGNOSIS — D509 Iron deficiency anemia, unspecified: Secondary | ICD-10-CM | POA: Diagnosis not present

## 2015-06-30 DIAGNOSIS — N2581 Secondary hyperparathyroidism of renal origin: Secondary | ICD-10-CM | POA: Diagnosis not present

## 2015-06-30 DIAGNOSIS — D631 Anemia in chronic kidney disease: Secondary | ICD-10-CM | POA: Diagnosis not present

## 2015-06-30 DIAGNOSIS — N186 End stage renal disease: Secondary | ICD-10-CM | POA: Diagnosis not present

## 2015-07-01 NOTE — Telephone Encounter (Signed)
Rx(s) sent to pharmacy electronically.  

## 2015-07-02 DIAGNOSIS — E1129 Type 2 diabetes mellitus with other diabetic kidney complication: Secondary | ICD-10-CM | POA: Diagnosis not present

## 2015-07-02 DIAGNOSIS — Z992 Dependence on renal dialysis: Secondary | ICD-10-CM | POA: Diagnosis not present

## 2015-07-02 DIAGNOSIS — N2581 Secondary hyperparathyroidism of renal origin: Secondary | ICD-10-CM | POA: Diagnosis not present

## 2015-07-02 DIAGNOSIS — E119 Type 2 diabetes mellitus without complications: Secondary | ICD-10-CM | POA: Diagnosis not present

## 2015-07-02 DIAGNOSIS — N186 End stage renal disease: Secondary | ICD-10-CM | POA: Diagnosis not present

## 2015-07-02 DIAGNOSIS — D631 Anemia in chronic kidney disease: Secondary | ICD-10-CM | POA: Diagnosis not present

## 2015-07-02 DIAGNOSIS — D509 Iron deficiency anemia, unspecified: Secondary | ICD-10-CM | POA: Diagnosis not present

## 2015-07-05 DIAGNOSIS — N186 End stage renal disease: Secondary | ICD-10-CM | POA: Diagnosis not present

## 2015-07-05 DIAGNOSIS — D631 Anemia in chronic kidney disease: Secondary | ICD-10-CM | POA: Diagnosis not present

## 2015-07-05 DIAGNOSIS — N2581 Secondary hyperparathyroidism of renal origin: Secondary | ICD-10-CM | POA: Diagnosis not present

## 2015-07-05 DIAGNOSIS — E119 Type 2 diabetes mellitus without complications: Secondary | ICD-10-CM | POA: Diagnosis not present

## 2015-07-05 DIAGNOSIS — D509 Iron deficiency anemia, unspecified: Secondary | ICD-10-CM | POA: Diagnosis not present

## 2015-07-07 DIAGNOSIS — N2581 Secondary hyperparathyroidism of renal origin: Secondary | ICD-10-CM | POA: Diagnosis not present

## 2015-07-07 DIAGNOSIS — N186 End stage renal disease: Secondary | ICD-10-CM | POA: Diagnosis not present

## 2015-07-07 DIAGNOSIS — D509 Iron deficiency anemia, unspecified: Secondary | ICD-10-CM | POA: Diagnosis not present

## 2015-07-07 DIAGNOSIS — D631 Anemia in chronic kidney disease: Secondary | ICD-10-CM | POA: Diagnosis not present

## 2015-07-07 DIAGNOSIS — E119 Type 2 diabetes mellitus without complications: Secondary | ICD-10-CM | POA: Diagnosis not present

## 2015-07-08 DIAGNOSIS — S91301A Unspecified open wound, right foot, initial encounter: Secondary | ICD-10-CM | POA: Diagnosis not present

## 2015-07-08 DIAGNOSIS — I70293 Other atherosclerosis of native arteries of extremities, bilateral legs: Secondary | ICD-10-CM | POA: Diagnosis not present

## 2015-07-09 DIAGNOSIS — D509 Iron deficiency anemia, unspecified: Secondary | ICD-10-CM | POA: Diagnosis not present

## 2015-07-09 DIAGNOSIS — D631 Anemia in chronic kidney disease: Secondary | ICD-10-CM | POA: Diagnosis not present

## 2015-07-09 DIAGNOSIS — N2581 Secondary hyperparathyroidism of renal origin: Secondary | ICD-10-CM | POA: Diagnosis not present

## 2015-07-09 DIAGNOSIS — N186 End stage renal disease: Secondary | ICD-10-CM | POA: Diagnosis not present

## 2015-07-09 DIAGNOSIS — E119 Type 2 diabetes mellitus without complications: Secondary | ICD-10-CM | POA: Diagnosis not present

## 2015-07-12 DIAGNOSIS — N2581 Secondary hyperparathyroidism of renal origin: Secondary | ICD-10-CM | POA: Diagnosis not present

## 2015-07-12 DIAGNOSIS — D631 Anemia in chronic kidney disease: Secondary | ICD-10-CM | POA: Diagnosis not present

## 2015-07-12 DIAGNOSIS — E119 Type 2 diabetes mellitus without complications: Secondary | ICD-10-CM | POA: Diagnosis not present

## 2015-07-12 DIAGNOSIS — N186 End stage renal disease: Secondary | ICD-10-CM | POA: Diagnosis not present

## 2015-07-12 DIAGNOSIS — D509 Iron deficiency anemia, unspecified: Secondary | ICD-10-CM | POA: Diagnosis not present

## 2015-07-14 ENCOUNTER — Ambulatory Visit (HOSPITAL_BASED_OUTPATIENT_CLINIC_OR_DEPARTMENT_OTHER): Payer: Medicare Other

## 2015-07-14 ENCOUNTER — Other Ambulatory Visit: Payer: Self-pay

## 2015-07-14 DIAGNOSIS — I5030 Unspecified diastolic (congestive) heart failure: Secondary | ICD-10-CM | POA: Diagnosis not present

## 2015-07-14 DIAGNOSIS — I272 Other secondary pulmonary hypertension: Secondary | ICD-10-CM

## 2015-07-14 DIAGNOSIS — I313 Pericardial effusion (noninflammatory): Secondary | ICD-10-CM | POA: Diagnosis not present

## 2015-07-14 DIAGNOSIS — I119 Hypertensive heart disease without heart failure: Secondary | ICD-10-CM | POA: Diagnosis not present

## 2015-07-14 DIAGNOSIS — N186 End stage renal disease: Secondary | ICD-10-CM | POA: Diagnosis not present

## 2015-07-14 DIAGNOSIS — I358 Other nonrheumatic aortic valve disorders: Secondary | ICD-10-CM | POA: Diagnosis not present

## 2015-07-14 DIAGNOSIS — I2510 Atherosclerotic heart disease of native coronary artery without angina pectoris: Secondary | ICD-10-CM | POA: Diagnosis not present

## 2015-07-14 DIAGNOSIS — N2581 Secondary hyperparathyroidism of renal origin: Secondary | ICD-10-CM | POA: Diagnosis not present

## 2015-07-14 DIAGNOSIS — I071 Rheumatic tricuspid insufficiency: Secondary | ICD-10-CM | POA: Diagnosis not present

## 2015-07-14 DIAGNOSIS — Z720 Tobacco use: Secondary | ICD-10-CM | POA: Diagnosis not present

## 2015-07-14 DIAGNOSIS — F329 Major depressive disorder, single episode, unspecified: Secondary | ICD-10-CM | POA: Diagnosis not present

## 2015-07-14 DIAGNOSIS — I340 Nonrheumatic mitral (valve) insufficiency: Secondary | ICD-10-CM | POA: Diagnosis not present

## 2015-07-14 DIAGNOSIS — D631 Anemia in chronic kidney disease: Secondary | ICD-10-CM | POA: Diagnosis not present

## 2015-07-14 DIAGNOSIS — I371 Nonrheumatic pulmonary valve insufficiency: Secondary | ICD-10-CM | POA: Diagnosis not present

## 2015-07-14 DIAGNOSIS — I700 Atherosclerosis of aorta: Secondary | ICD-10-CM | POA: Diagnosis not present

## 2015-07-14 DIAGNOSIS — R938 Abnormal findings on diagnostic imaging of other specified body structures: Secondary | ICD-10-CM | POA: Diagnosis not present

## 2015-07-14 DIAGNOSIS — E785 Hyperlipidemia, unspecified: Secondary | ICD-10-CM | POA: Diagnosis not present

## 2015-07-14 DIAGNOSIS — D509 Iron deficiency anemia, unspecified: Secondary | ICD-10-CM | POA: Diagnosis not present

## 2015-07-14 DIAGNOSIS — I132 Hypertensive heart and chronic kidney disease with heart failure and with stage 5 chronic kidney disease, or end stage renal disease: Secondary | ICD-10-CM | POA: Diagnosis not present

## 2015-07-14 DIAGNOSIS — J449 Chronic obstructive pulmonary disease, unspecified: Secondary | ICD-10-CM | POA: Diagnosis not present

## 2015-07-14 DIAGNOSIS — I70213 Atherosclerosis of native arteries of extremities with intermittent claudication, bilateral legs: Secondary | ICD-10-CM | POA: Diagnosis not present

## 2015-07-14 DIAGNOSIS — E119 Type 2 diabetes mellitus without complications: Secondary | ICD-10-CM | POA: Diagnosis not present

## 2015-07-15 ENCOUNTER — Encounter: Payer: Self-pay | Admitting: Family

## 2015-07-15 ENCOUNTER — Ambulatory Visit (HOSPITAL_COMMUNITY)
Admission: RE | Admit: 2015-07-15 | Discharge: 2015-07-15 | Disposition: A | Discharge status: Home / Self Care | Payer: Medicare Other | Source: Ambulatory Visit | Attending: Family | Admitting: Family

## 2015-07-15 DIAGNOSIS — I70213 Atherosclerosis of native arteries of extremities with intermittent claudication, bilateral legs: Secondary | ICD-10-CM | POA: Diagnosis not present

## 2015-07-15 DIAGNOSIS — I358 Other nonrheumatic aortic valve disorders: Secondary | ICD-10-CM | POA: Diagnosis not present

## 2015-07-15 DIAGNOSIS — N186 End stage renal disease: Secondary | ICD-10-CM | POA: Insufficient documentation

## 2015-07-15 DIAGNOSIS — R938 Abnormal findings on diagnostic imaging of other specified body structures: Secondary | ICD-10-CM | POA: Insufficient documentation

## 2015-07-15 DIAGNOSIS — I313 Pericardial effusion (noninflammatory): Secondary | ICD-10-CM | POA: Insufficient documentation

## 2015-07-15 DIAGNOSIS — I071 Rheumatic tricuspid insufficiency: Secondary | ICD-10-CM | POA: Diagnosis not present

## 2015-07-15 DIAGNOSIS — I371 Nonrheumatic pulmonary valve insufficiency: Secondary | ICD-10-CM | POA: Insufficient documentation

## 2015-07-15 DIAGNOSIS — I2510 Atherosclerotic heart disease of native coronary artery without angina pectoris: Secondary | ICD-10-CM | POA: Insufficient documentation

## 2015-07-15 DIAGNOSIS — I340 Nonrheumatic mitral (valve) insufficiency: Secondary | ICD-10-CM | POA: Insufficient documentation

## 2015-07-15 DIAGNOSIS — I272 Other secondary pulmonary hypertension: Secondary | ICD-10-CM | POA: Insufficient documentation

## 2015-07-15 DIAGNOSIS — I119 Hypertensive heart disease without heart failure: Secondary | ICD-10-CM | POA: Insufficient documentation

## 2015-07-15 DIAGNOSIS — J449 Chronic obstructive pulmonary disease, unspecified: Secondary | ICD-10-CM | POA: Insufficient documentation

## 2015-07-15 DIAGNOSIS — E785 Hyperlipidemia, unspecified: Secondary | ICD-10-CM | POA: Insufficient documentation

## 2015-07-15 DIAGNOSIS — Z720 Tobacco use: Secondary | ICD-10-CM | POA: Insufficient documentation

## 2015-07-15 DIAGNOSIS — I5030 Unspecified diastolic (congestive) heart failure: Secondary | ICD-10-CM | POA: Insufficient documentation

## 2015-07-15 DIAGNOSIS — F329 Major depressive disorder, single episode, unspecified: Secondary | ICD-10-CM | POA: Insufficient documentation

## 2015-07-15 DIAGNOSIS — I132 Hypertensive heart and chronic kidney disease with heart failure and with stage 5 chronic kidney disease, or end stage renal disease: Secondary | ICD-10-CM | POA: Insufficient documentation

## 2015-07-15 DIAGNOSIS — I700 Atherosclerosis of aorta: Secondary | ICD-10-CM | POA: Insufficient documentation

## 2015-07-16 DIAGNOSIS — D509 Iron deficiency anemia, unspecified: Secondary | ICD-10-CM | POA: Diagnosis not present

## 2015-07-16 DIAGNOSIS — E119 Type 2 diabetes mellitus without complications: Secondary | ICD-10-CM | POA: Diagnosis not present

## 2015-07-16 DIAGNOSIS — N186 End stage renal disease: Secondary | ICD-10-CM | POA: Diagnosis not present

## 2015-07-16 DIAGNOSIS — N2581 Secondary hyperparathyroidism of renal origin: Secondary | ICD-10-CM | POA: Diagnosis not present

## 2015-07-16 DIAGNOSIS — D631 Anemia in chronic kidney disease: Secondary | ICD-10-CM | POA: Diagnosis not present

## 2015-07-19 DIAGNOSIS — D631 Anemia in chronic kidney disease: Secondary | ICD-10-CM | POA: Diagnosis not present

## 2015-07-19 DIAGNOSIS — E119 Type 2 diabetes mellitus without complications: Secondary | ICD-10-CM | POA: Diagnosis not present

## 2015-07-19 DIAGNOSIS — N2581 Secondary hyperparathyroidism of renal origin: Secondary | ICD-10-CM | POA: Diagnosis not present

## 2015-07-19 DIAGNOSIS — D509 Iron deficiency anemia, unspecified: Secondary | ICD-10-CM | POA: Diagnosis not present

## 2015-07-19 DIAGNOSIS — N186 End stage renal disease: Secondary | ICD-10-CM | POA: Diagnosis not present

## 2015-07-20 ENCOUNTER — Encounter: Payer: Self-pay | Admitting: Family

## 2015-07-20 ENCOUNTER — Ambulatory Visit (INDEPENDENT_AMBULATORY_CARE_PROVIDER_SITE_OTHER): Payer: Medicare Other | Admitting: Family

## 2015-07-20 VITALS — BP 146/63 | HR 59 | Temp 98.1°F | Resp 18 | Ht 62.5 in | Wt 157.0 lb

## 2015-07-20 DIAGNOSIS — I70213 Atherosclerosis of native arteries of extremities with intermittent claudication, bilateral legs: Secondary | ICD-10-CM | POA: Diagnosis not present

## 2015-07-20 DIAGNOSIS — R0989 Other specified symptoms and signs involving the circulatory and respiratory systems: Secondary | ICD-10-CM

## 2015-07-20 DIAGNOSIS — Z720 Tobacco use: Secondary | ICD-10-CM | POA: Diagnosis not present

## 2015-07-20 DIAGNOSIS — F17200 Nicotine dependence, unspecified, uncomplicated: Secondary | ICD-10-CM

## 2015-07-20 NOTE — Progress Notes (Signed)
[facility] [place]   CC: Follow up peripheral artery occlusive disease  History of Present Illness [name] is a 71 y.o. female patient of Dr. Bridgett Larsson who is s/p arteriogram with bilateral run off on 12/31/14 . The patient's lower extremity symptoms are unchanged: intermittent claudication. The patient is able to complete their activities of daily living. The patient denies rest pain at this point.  After walking about 20 feet, the left calf starts hurting, relieved by rest.  AV graft HD access is in her left upper arm, MWF are HD days.  She denies non healing wounds in her LE's.  She reports a stoke, MI, and seizures about 2005, started HD at that time.  The patient denies New Medical or Surgical History.  Pt Diabetic: No Pt smoker: smoker  (1/2 ppd, started at age 62 yrs)  Pt meds include: Statin :Yes Betablocker: Yes ASA: Yes Other anticoagulants/antiplatelets: no  Past Medical History  Diagnosis Date  . CAD (coronary artery disease)     stent to RCA  . CVA (cerebral infarction) 2003    no apparent residual  . Hypothyroidism   . PVD (peripheral vascular disease) ([place])   . Hyperlipidemia   . Positive PPD     completed rifampin  . Diastolic congestive heart failure ([place])   . Hypertension   . COPD (chronic obstructive pulmonary disease) ([facility]) t  . Cancer ([place])     clear cell cancer, kidney  . Complication of anesthesia 12/2010    pt is very confused, with AMS with anesthesia  . CVA 07/27/2008    CVA affected cognition and memory per family, no focal deficits.    . ESRD 07/27/2008    ESRD due to HTN and NSAID's, started hemodialysis in 2005 in [place], [place]. Went to [facility] from 2010 to 2012 and since 2012 has been getting dialysis at [facility] on [address] in [place] on a MWF schedule. First access with RUA AVG placed in [place]. Next and current access was LUA AVG placed by Dr. Lucky Cowboy in [place] in or around 2012. Has had  2 or 3 procedures on that graft since placed per family. She gets her access work done here in [place] now. She is allergic to heparin and does not get any heparin at dialysis; she had an allergic reaction apparently when in ICU in the past.      . Encephalopathy   . Seizures ([place])   . Dyslipidemia   . Chronic renal insufficiency     On hemodialysis  . Arthritis of shoulder     Bilateral  . Depression     Social History Social History  Substance Use Topics  . Smoking status: Current Every Day Smoker -- 0.50 packs/day for 53 years    Types: Cigarettes  . Smokeless tobacco: Never Used     Comment: down to .5-.75 ppd 06/01/15  . Alcohol Use: 0.0 oz/week    0 Standard drinks or equivalent per week     Comment: very rarely per pt    Family History Family History  Problem Relation Age of Onset  . Heart disease Father   . Hypertension Mother   . Dementia Mother   . Coronary artery disease Sister   . Heart attack Sister   . Hypertension Brother     Past Surgical History  Procedure Laterality Date  . Abdominal hysterectomy    . Cholecystectomy    . D&cs    . Right ankle repair    .  Left nephrectomy    . Vascular surgery  11/2010    graft inserted to left arm  . Nephrectomy Left     Malignant tumor  . Cataract extraction Bilateral   . Tonsillectomy    . Cardiac catheterization    . Left heart catheterization with coronary angiogram N/A 02/22/2011    Procedure: LEFT HEART CATHETERIZATION WITH CORONARY ANGIOGRAM;  Surgeon: Wellington Hampshire, MD;  Location: [facility] CATH LAB;  Service: Cardiovascular;  Laterality: N/A;  . Right heart catheterization N/A 01/29/2014    Procedure: RIGHT HEART CATH;  Surgeon: Larey Dresser, MD;  Location[facility] C[facility] LAB;  Service: Cardiovascular;  Laterality: N/A;  . Peripheral vascular catheterization N/A 12/31/2014    Procedure: Abdominal Aortogram;  Surgeon: Conrad [place] Troy, MD;  Location: [facility] CV LAB;  Service: Cardiovascular;  Laterality: N/A;     Allergies  Allergen Reactions  . Ace Inhibitors Anaphylaxis and Rash  . Heparin Other (See Comments)    MDs told her not to take after reaction in ICU  . Iohexol Swelling and Other (See Comments)    1970s; passed out and had facial/tongue swelling.  Requires 13-hour prep with prednisone and benadryl  . Phenytoin Other (See Comments)    Had reaction while in ICU; doesn't know.  MDs told her not to take ever again.  [name] Phenytoin Sodium Extended Other (See Comments)  . Wellbutrin [Bupropion] Other (See Comments)    seizures  . Ampicillin Hives and Rash  . Meperidine Hcl Swelling and Rash    Makes tongue swell  . Morphine Rash  . Penicillins Rash    Has patient had a PCN reaction causing immediate rash, facial/tongue/throat swelling, SOB or lightheadedness with hypotension: Yes Has patient had a PCN reaction causing severe rash involving mucus membranes or skin necrosis: No Has patient had a PCN reaction that required hospitalization No Has patient had a PCN reaction occurring within the last 10 years: No If all of the above answers are "NO", then may proceed with Cephalosporin use.    [name] Pentazocine Rash  . Valproic Acid And Related Other (See Comments)    Confusion   . Iodinated Diagnostic Agents Other (See Comments)    Pre-meditate with benadryl and prednisone 3 times before appt.  . Pentazocine Lactate Other (See Comments)    Patient does not remember reaction to this med ([place]).     Current Outpatient Prescriptions  Medication Sig Dispense Refill  . acetaminophen (TYLENOL) 500 MG tablet Take 1,000 mg by mouth daily as needed for mild pain.     [name] amLODipine (NORVASC) 10 MG tablet Take 10 mg by mouth at bedtime.     [name] aspirin EC 81 MG tablet Take 81 mg by mouth daily.    . carvedilol (COREG) 25 MG tablet Take 25 mg by mouth 2 (two) times daily with a meal.    . diphenhydrAMINE (BENADRYL) 50 MG capsule Take 50 mg by mouth on 12/24/14, at 6:30 am. (Patient taking  differently: Take 50 mg by mouth every Monday, Wednesday, and Friday. On dialysis days) 1 capsule 0  . hydrALAZINE (APRESOLINE) 50 MG tablet Take 50 mg by mouth 3 (three) times daily.    . hydrOXYzine (ATARAX/VISTARIL) 25 MG tablet Take 25 mg by mouth daily as needed for itching. Reported on 12/16/2014  6  . IOPHEN C-NR 100-10 MG/5ML syrup     . isosorbide mononitrate (IMDUR) 30 MG 24 hr tablet Take 1 tablet (30 mg total) by mouth daily. PLEASE  CONTACT OFFICE FOR ADDITIONAL REFILLS 15 tablet 0  . LETAIRIS 5 MG tablet TAKE 1 TABLET (5 MG) ORALLY DAILY. DO NOT HANDLE IF PREGNANT. DO NOT SPLIT, CRUSH OR CHEW. AVOID INHALATION AND CONTACT WITH SKIN OR EYE. RE 30 tablet 2  . levETIRAcetam (KEPPRA) 500 MG tablet Take 500 mg by mouth daily at 8 pm.    . levothyroxine (SYNTHROID, LEVOTHROID) 88 MCG tablet Take 88 mcg by mouth daily before breakfast.    . multivitamin (RENA-VIT) TABS tablet Take 1 tablet by mouth daily.    [name] NITROSTAT 0.4 MG SL tablet Place 0.4 mg under the tongue every 5 (five) minutes as needed for chest pain.     [name] omeprazole (PRILOSEC OTC) 20 MG tablet Take 20 mg by mouth daily.    . pravastatin (PRAVACHOL) 40 MG tablet Take 40 mg by mouth at bedtime.     . SENSIPAR 90 MG tablet Take 90 mg by mouth at bedtime.     . sevelamer carbonate (RENVELA) 800 MG tablet Take 1,600-3,200 mg by mouth 3 (three) times daily. 4 tabs TID, 2 tabs with each snack    . traMADol (ULTRAM) 50 MG tablet Take 50 mg by mouth daily as needed for moderate pain.     . traZODone (DESYREL) 50 MG tablet Take 50 mg by mouth at bedtime as needed for sleep.     [name] albuterol (PROVENTIL HFA;VENTOLIN HFA) 108 (90 BASE) MCG/ACT inhaler Inhale 1-2 puffs into the lungs every 6 (six) hours as needed for wheezing or shortness of breath. (Patient not taking: Reported on 07/20/2015) 1 Inhaler 0  . predniSONE (DELTASONE) 20 MG tablet Reported on 07/20/2015    . predniSONE (DELTASONE) 50 MG tablet One tablet (50mg ) 12/23/14 at 6:30  pm; one tablet (50mg ) 12/24/14 at 12:30 am and then one tablet (50 mg) 12/24/14 at 6:30 am. (Patient not taking: Reported on 07/20/2015) 3 tablet 0  . predniSONE (DELTASONE) 50 MG tablet Prednisone 50 mg po 13 hrs, 7 hrs and 1 hr prior to CT (scheduled for 06/08/2015 at 8am) (Patient not taking: Reported on 07/20/2015) 3 tablet 0   No current facility-administered medications for this visit.    ROS: See HPI for pertinent positives and negatives.   Physical Examination  Filed Vitals:   07/20/15 1257  BP: 146/63  Pulse: 59  Temp: 98.1 F (36.7 C)  TempSrc: Oral  Resp: 18  Height: 5' 2.5" (1.588 m)  Weight: 157 lb (71.215 kg)  SpO2: 95%   Body mass index is 28.24 kg/(m^2).  General: A&O x 3, WDWN  Pulmonary: Sym exp, fair air movt, respirations are non labored, few rales in both bases, no rhonchi or wheezing  Cardiac: RRR, Nl S1, S2, + murmur  Vascular: Vessel Right Left  Radial Palpable Not Palpable  Brachial Palpable Palpable  Carotid Palpable, without bruit Palpable, with bruit  Aorta Not palpable N/A  Femoral Palpable Palpable  Popliteal Not palpable Not palpable  PT Not Palpable Not Palpable  DP Not Palpable Not Palpable   Gastrointestinal: soft, NTND, no G/R, no HSM, no palpable masses, no CVAT B, mid-line incision healed  Musculoskeletal: M/S 5/5 throughout , Extremities without ischemic changes , R medial thigh incision healed, LUA AVG palpable with thrill and bruit  Neurologic: CN 2-12 grossly intact, Pain and light touch intact in extremities except decreased in feet, Motor exam as listed above         ASSESSMENT: [name] is a 70 y.o. female who presents  with: a history of bilateral LE stents [place] about [place] city. She has moderate claudication in her left calf that is not life limiting.  She has no signs of ischemia in her feet/legs.  ABI's on 07/15/15 indicate monophasic waveforms  throughout that do not correspond to ABI's, indicating medial vessel calcification secondary to ESRD. Right TBI was 0.33 (0.47, 12/16/14) with toe pressure of 59; left TBI was 0.46 (0.54) with toe pressure of 82. Graduated walking program discussed and how to achieve. She had a CVA in 2005. She has a left carotid bruit, no carotid duplex results on file, will check carotid duplex on her return in 3 months with ABI's. We discussed the signs and sx of CLI, so she will return sooner if necessary.   Dr. Bridgett Larsson last saw pt on 01/29/15. At that time the angiogram demonstrated bilateral occluded stents with occluded SFA with extensive collateralization via the PFA. She has brisk refilling of her AK popliteal arteries.  Based on her angiographic findings, this patient needs: no immediate intervention thus she might be a candidate for B fem-AK pop bypasses.  She does NOT want bypasses at this point and is making an attempt at maximal medical mgmt.  She knows she needs to quit smoking. The patient was counseled re smoking cessation and given several free resources re smoking cessation.  The patient is currently on a statin: Pravachol.  The patient is currently on an anti-platelet: ASA.   PLAN:  Based on the patient's vascular studies and examination, pt will return to clinic in 3 months with ABI's and carotid duplex.  I discussed in depth with the patient the nature of atherosclerosis, and emphasized the importance of maximal medical management including strict control of blood pressure, blood glucose, and lipid levels, obtaining regular exercise, and cessation of smoking.  The patient is aware that without maximal medical management the underlying atherosclerotic disease process will progress, limiting the benefit of any interventions.  The patient was given information about PAD including signs, symptoms, treatment, what symptoms should prompt the patient to seek immediate medical care, and risk  reduction measures to take.  Clemon Chambers, RN, MSN, FNP-C [facility] Phone: [phone]  Clinic MD: Early  07/20/2015 1:14 PM

## 2015-07-20 NOTE — Patient Instructions (Signed)
Intermittent Claudication Intermittent claudication is pain in your leg that occurs when you walk or exercise and goes away when you rest. The pain can occur in one or both legs. CAUSES Intermittent claudication is caused by the buildup of plaque within the major arteries in the body (atherosclerosis). The plaque, which makes arteries stiff and narrow, prevents enough blood from reaching your leg muscles. The pain occurs when you walk or exercise because your muscles need more blood when you are moving and exercising. RISK FACTORS Risk factors include:  A family history of atherosclerosis.  A personal history of stroke or heart disease.  Older age.  Being inactive or overweight.  Smoking cigarettes.  Having another health condition such as:  Diabetes.  High blood pressure.  High cholesterol. SIGNS AND SYMPTOMS  Your hip or leg may:   Ache.  Cramp.  Feel tight.  Feel weak.  Feel heavy. Over time, you may feel pain in your calf, thigh, or hip. DIAGNOSIS  Your health care provider may diagnose intermittent claudication based on your symptoms and medical history. Your health care provider may also do tests to learn more about your condition. These may include:  Blood tests.  An ultrasound.  Imaging tests such as angiography, magnetic resonance angiography (MRA), and computed tomography angiography (CTA). TREATMENT You may be treated for problems such as:  High blood pressure.  High cholesterol.  Diabetes. Other treatments may include:  Lifestyle changes such as:  Starting an exercise program.  Losing weight.  Quitting smoking.  Medicines to help restore blood flow through your legs.  Blood vessel surgery (angioplasty) to restore blood flow if your intermittent claudication is caused by severe peripheral artery disease. HOME CARE INSTRUCTIONS  Manage any other health conditions you have.  Eat a diet low in saturated fats and calories to maintain a  healthy weight.  Quit smoking, if you smoke.  Take medicines only as directed by your health care provider.  If your health care provider recommended an exercise program for you, follow it as directed. Your exercise program may involve:  Walking three or more times a week.  Walking until you have certain symptoms of intermittent claudication.  Resting until symptoms go away.  Gradually increasing walking time to about 50 minutes a day. SEEK MEDICAL CARE IF: Your condition is not getting better or is getting worse. SEEK IMMEDIATE MEDICAL CARE IF:   You have chest pain.  You have difficulty breathing.  You develop arm weakness.  You have trouble speaking.  Your face begins to droop. MAKE SURE YOU:  Understand these instructions.  Will watch your condition.  Will get help if you are not doing well or get worse.   This information is not intended to replace advice given to you by your health care provider. Make sure you discuss any questions you have with your health care provider.   Document Released: 10/22/2003 Document Revised: 01/09/2014 Document Reviewed: 03/27/2013 Elsevier Interactive Patient Education 2016 Elsevier Inc.     Steps to Quit Smoking  Smoking tobacco can be harmful to your health and can affect almost every organ in your body. Smoking puts you, and those around you, at risk for developing many serious chronic diseases. Quitting smoking is difficult, but it is one of the best things that you can do for your health. It is never too late to quit. WHAT ARE THE BENEFITS OF QUITTING SMOKING? When you quit smoking, you lower your risk of developing serious diseases and conditions, such as:    Lung cancer or lung disease, such as COPD.  Heart disease.  Stroke.  Heart attack.  Infertility.  Osteoporosis and bone fractures. Additionally, symptoms such as coughing, wheezing, and shortness of breath may get better when you quit. You may also find that  you get sick less often because your body is stronger at fighting off colds and infections. If you are pregnant, quitting smoking can help to reduce your chances of having a baby of low birth weight. HOW DO I GET READY TO QUIT? When you decide to quit smoking, create a plan to make sure that you are successful. Before you quit:  Pick a date to quit. Set a date within the next two weeks to give you time to prepare.  Write down the reasons why you are quitting. Keep this list in places where you will see it often, such as on your bathroom mirror or in your car or wallet.  Identify the people, places, things, and activities that make you want to smoke (triggers) and avoid them. Make sure to take these actions:  Throw away all cigarettes at home, at work, and in your car.  Throw away smoking accessories, such as ashtrays and lighters.  Clean your car and make sure to empty the ashtray.  Clean your home, including curtains and carpets.  Tell your family, friends, and coworkers that you are quitting. Support from your loved ones can make quitting easier.  Talk with your health care provider about your options for quitting smoking.  Find out what treatment options are covered by your health insurance. WHAT STRATEGIES CAN I USE TO QUIT SMOKING?  Talk with your healthcare provider about different strategies to quit smoking. Some strategies include:  Quitting smoking altogether instead of gradually lessening how much you smoke over a period of time. Research shows that quitting "cold turkey" is more successful than gradually quitting.  Attending in-person counseling to help you build problem-solving skills. You are more likely to have success in quitting if you attend several counseling sessions. Even short sessions of 10 minutes can be effective.  Finding resources and support systems that can help you to quit smoking and remain smoke-free after you quit. These resources are most helpful when  you use them often. They can include:  Online chats with a counselor.  Telephone quitlines.  Printed self-help materials.  Support groups or group counseling.  Text messaging programs.  Mobile phone applications.  Taking medicines to help you quit smoking. (If you are pregnant or breastfeeding, talk with your health care provider first.) Some medicines contain nicotine and some do not. Both types of medicines help with cravings, but the medicines that include nicotine help to relieve withdrawal symptoms. Your health care provider may recommend:  Nicotine patches, gum, or lozenges.  Nicotine inhalers or sprays.  Non-nicotine medicine that is taken by mouth. Talk with your health care provider about combining strategies, such as taking medicines while you are also receiving in-person counseling. Using these two strategies together makes you more likely to succeed in quitting than if you used either strategy on its own. If you are pregnant or breastfeeding, talk with your health care provider about finding counseling or other support strategies to quit smoking. Do not take medicine to help you quit smoking unless told to do so by your health care provider. WHAT THINGS CAN I DO TO MAKE IT EASIER TO QUIT? Quitting smoking might feel overwhelming at first, but there is a lot that you can do to make   it easier. Take these important actions:  Reach out to your family and friends and ask that they support and encourage you during this time. Call telephone quitlines, reach out to support groups, or work with a counselor for support.  Ask people who smoke to avoid smoking around you.  Avoid places that trigger you to smoke, such as bars, parties, or smoke-break areas at work.  Spend time around people who do not smoke.  Lessen stress in your life, because stress can be a smoking trigger for some people. To lessen stress, try:  Exercising regularly.  Deep-breathing  exercises.  Yoga.  Meditating.  Performing a body scan. This involves closing your eyes, scanning your body from head to toe, and noticing which parts of your body are particularly tense. Purposefully relax the muscles in those areas.  Download or purchase mobile phone or tablet apps (applications) that can help you stick to your quit plan by providing reminders, tips, and encouragement. There are many free apps, such as QuitGuide from the CDC (Centers for Disease Control and Prevention). You can find other support for quitting smoking (smoking cessation) through smokefree.gov and other websites. HOW WILL I FEEL WHEN I QUIT SMOKING? Within the first 24 hours of quitting smoking, you may start to feel some withdrawal symptoms. These symptoms are usually most noticeable 2-3 days after quitting, but they usually do not last beyond 2-3 weeks. Changes or symptoms that you might experience include:  Mood swings.  Restlessness, anxiety, or irritation.  Difficulty concentrating.  Dizziness.  Strong cravings for sugary foods in addition to nicotine.  Mild weight gain.  Constipation.  Nausea.  Coughing or a sore throat.  Changes in how your medicines work in your body.  A depressed mood.  Difficulty sleeping (insomnia). After the first 2-3 weeks of quitting, you may start to notice more positive results, such as:  Improved sense of smell and taste.  Decreased coughing and sore throat.  Slower heart rate.  Lower blood pressure.  Clearer skin.  The ability to breathe more easily.  Fewer sick days. Quitting smoking is very challenging for most people. Do not get discouraged if you are not successful the first time. Some people need to make many attempts to quit before they achieve long-term success. Do your best to stick to your quit plan, and talk with your health care provider if you have any questions or concerns.   This information is not intended to replace advice given to  you by your health care provider. Make sure you discuss any questions you have with your health care provider.   Document Released: 12/13/2000 Document Revised: 05/05/2014 Document Reviewed: 05/05/2014 Elsevier Interactive Patient Education 2016 Elsevier Inc.  

## 2015-07-21 DIAGNOSIS — N2581 Secondary hyperparathyroidism of renal origin: Secondary | ICD-10-CM | POA: Diagnosis not present

## 2015-07-21 DIAGNOSIS — N186 End stage renal disease: Secondary | ICD-10-CM | POA: Diagnosis not present

## 2015-07-21 DIAGNOSIS — D631 Anemia in chronic kidney disease: Secondary | ICD-10-CM | POA: Diagnosis not present

## 2015-07-21 DIAGNOSIS — E119 Type 2 diabetes mellitus without complications: Secondary | ICD-10-CM | POA: Diagnosis not present

## 2015-07-21 DIAGNOSIS — D509 Iron deficiency anemia, unspecified: Secondary | ICD-10-CM | POA: Diagnosis not present

## 2015-07-23 DIAGNOSIS — E119 Type 2 diabetes mellitus without complications: Secondary | ICD-10-CM | POA: Diagnosis not present

## 2015-07-23 DIAGNOSIS — D631 Anemia in chronic kidney disease: Secondary | ICD-10-CM | POA: Diagnosis not present

## 2015-07-23 DIAGNOSIS — N186 End stage renal disease: Secondary | ICD-10-CM | POA: Diagnosis not present

## 2015-07-23 DIAGNOSIS — D509 Iron deficiency anemia, unspecified: Secondary | ICD-10-CM | POA: Diagnosis not present

## 2015-07-23 DIAGNOSIS — N2581 Secondary hyperparathyroidism of renal origin: Secondary | ICD-10-CM | POA: Diagnosis not present

## 2015-07-26 DIAGNOSIS — D509 Iron deficiency anemia, unspecified: Secondary | ICD-10-CM | POA: Diagnosis not present

## 2015-07-26 DIAGNOSIS — D631 Anemia in chronic kidney disease: Secondary | ICD-10-CM | POA: Diagnosis not present

## 2015-07-26 DIAGNOSIS — N2581 Secondary hyperparathyroidism of renal origin: Secondary | ICD-10-CM | POA: Diagnosis not present

## 2015-07-26 DIAGNOSIS — N186 End stage renal disease: Secondary | ICD-10-CM | POA: Diagnosis not present

## 2015-07-26 DIAGNOSIS — E119 Type 2 diabetes mellitus without complications: Secondary | ICD-10-CM | POA: Diagnosis not present

## 2015-07-28 DIAGNOSIS — N186 End stage renal disease: Secondary | ICD-10-CM | POA: Diagnosis not present

## 2015-07-28 DIAGNOSIS — E119 Type 2 diabetes mellitus without complications: Secondary | ICD-10-CM | POA: Diagnosis not present

## 2015-07-28 DIAGNOSIS — D509 Iron deficiency anemia, unspecified: Secondary | ICD-10-CM | POA: Diagnosis not present

## 2015-07-28 DIAGNOSIS — N2581 Secondary hyperparathyroidism of renal origin: Secondary | ICD-10-CM | POA: Diagnosis not present

## 2015-07-28 DIAGNOSIS — D631 Anemia in chronic kidney disease: Secondary | ICD-10-CM | POA: Diagnosis not present

## 2015-07-30 DIAGNOSIS — D509 Iron deficiency anemia, unspecified: Secondary | ICD-10-CM | POA: Diagnosis not present

## 2015-07-30 DIAGNOSIS — N186 End stage renal disease: Secondary | ICD-10-CM | POA: Diagnosis not present

## 2015-07-30 DIAGNOSIS — D631 Anemia in chronic kidney disease: Secondary | ICD-10-CM | POA: Diagnosis not present

## 2015-07-30 DIAGNOSIS — N2581 Secondary hyperparathyroidism of renal origin: Secondary | ICD-10-CM | POA: Diagnosis not present

## 2015-07-30 DIAGNOSIS — E119 Type 2 diabetes mellitus without complications: Secondary | ICD-10-CM | POA: Diagnosis not present

## 2015-08-02 DIAGNOSIS — N2581 Secondary hyperparathyroidism of renal origin: Secondary | ICD-10-CM | POA: Diagnosis not present

## 2015-08-02 DIAGNOSIS — D631 Anemia in chronic kidney disease: Secondary | ICD-10-CM | POA: Diagnosis not present

## 2015-08-02 DIAGNOSIS — D509 Iron deficiency anemia, unspecified: Secondary | ICD-10-CM | POA: Diagnosis not present

## 2015-08-02 DIAGNOSIS — E119 Type 2 diabetes mellitus without complications: Secondary | ICD-10-CM | POA: Diagnosis not present

## 2015-08-02 DIAGNOSIS — Z992 Dependence on renal dialysis: Secondary | ICD-10-CM | POA: Diagnosis not present

## 2015-08-02 DIAGNOSIS — E1129 Type 2 diabetes mellitus with other diabetic kidney complication: Secondary | ICD-10-CM | POA: Diagnosis not present

## 2015-08-02 DIAGNOSIS — N186 End stage renal disease: Secondary | ICD-10-CM | POA: Diagnosis not present

## 2015-08-04 DIAGNOSIS — N2581 Secondary hyperparathyroidism of renal origin: Secondary | ICD-10-CM | POA: Diagnosis not present

## 2015-08-04 DIAGNOSIS — D631 Anemia in chronic kidney disease: Secondary | ICD-10-CM | POA: Diagnosis not present

## 2015-08-04 DIAGNOSIS — E119 Type 2 diabetes mellitus without complications: Secondary | ICD-10-CM | POA: Diagnosis not present

## 2015-08-04 DIAGNOSIS — N186 End stage renal disease: Secondary | ICD-10-CM | POA: Diagnosis not present

## 2015-08-04 DIAGNOSIS — D509 Iron deficiency anemia, unspecified: Secondary | ICD-10-CM | POA: Diagnosis not present

## 2015-08-06 DIAGNOSIS — N186 End stage renal disease: Secondary | ICD-10-CM | POA: Diagnosis not present

## 2015-08-06 DIAGNOSIS — D509 Iron deficiency anemia, unspecified: Secondary | ICD-10-CM | POA: Diagnosis not present

## 2015-08-06 DIAGNOSIS — E119 Type 2 diabetes mellitus without complications: Secondary | ICD-10-CM | POA: Diagnosis not present

## 2015-08-06 DIAGNOSIS — D631 Anemia in chronic kidney disease: Secondary | ICD-10-CM | POA: Diagnosis not present

## 2015-08-06 DIAGNOSIS — N2581 Secondary hyperparathyroidism of renal origin: Secondary | ICD-10-CM | POA: Diagnosis not present

## 2015-08-09 DIAGNOSIS — D509 Iron deficiency anemia, unspecified: Secondary | ICD-10-CM | POA: Diagnosis not present

## 2015-08-09 DIAGNOSIS — E119 Type 2 diabetes mellitus without complications: Secondary | ICD-10-CM | POA: Diagnosis not present

## 2015-08-09 DIAGNOSIS — D631 Anemia in chronic kidney disease: Secondary | ICD-10-CM | POA: Diagnosis not present

## 2015-08-09 DIAGNOSIS — N186 End stage renal disease: Secondary | ICD-10-CM | POA: Diagnosis not present

## 2015-08-09 DIAGNOSIS — N2581 Secondary hyperparathyroidism of renal origin: Secondary | ICD-10-CM | POA: Diagnosis not present

## 2015-08-10 DIAGNOSIS — L84 Corns and callosities: Secondary | ICD-10-CM | POA: Diagnosis not present

## 2015-08-10 DIAGNOSIS — L602 Onychogryphosis: Secondary | ICD-10-CM | POA: Diagnosis not present

## 2015-08-10 DIAGNOSIS — I70293 Other atherosclerosis of native arteries of extremities, bilateral legs: Secondary | ICD-10-CM | POA: Diagnosis not present

## 2015-08-13 DIAGNOSIS — D631 Anemia in chronic kidney disease: Secondary | ICD-10-CM | POA: Diagnosis not present

## 2015-08-13 DIAGNOSIS — N2581 Secondary hyperparathyroidism of renal origin: Secondary | ICD-10-CM | POA: Diagnosis not present

## 2015-08-13 DIAGNOSIS — E119 Type 2 diabetes mellitus without complications: Secondary | ICD-10-CM | POA: Diagnosis not present

## 2015-08-13 DIAGNOSIS — D509 Iron deficiency anemia, unspecified: Secondary | ICD-10-CM | POA: Diagnosis not present

## 2015-08-13 DIAGNOSIS — N186 End stage renal disease: Secondary | ICD-10-CM | POA: Diagnosis not present

## 2015-08-16 DIAGNOSIS — N186 End stage renal disease: Secondary | ICD-10-CM | POA: Diagnosis not present

## 2015-08-16 DIAGNOSIS — D509 Iron deficiency anemia, unspecified: Secondary | ICD-10-CM | POA: Diagnosis not present

## 2015-08-16 DIAGNOSIS — D631 Anemia in chronic kidney disease: Secondary | ICD-10-CM | POA: Diagnosis not present

## 2015-08-16 DIAGNOSIS — E119 Type 2 diabetes mellitus without complications: Secondary | ICD-10-CM | POA: Diagnosis not present

## 2015-08-16 DIAGNOSIS — N2581 Secondary hyperparathyroidism of renal origin: Secondary | ICD-10-CM | POA: Diagnosis not present

## 2015-08-18 DIAGNOSIS — E119 Type 2 diabetes mellitus without complications: Secondary | ICD-10-CM | POA: Diagnosis not present

## 2015-08-18 DIAGNOSIS — D509 Iron deficiency anemia, unspecified: Secondary | ICD-10-CM | POA: Diagnosis not present

## 2015-08-18 DIAGNOSIS — N186 End stage renal disease: Secondary | ICD-10-CM | POA: Diagnosis not present

## 2015-08-18 DIAGNOSIS — D631 Anemia in chronic kidney disease: Secondary | ICD-10-CM | POA: Diagnosis not present

## 2015-08-18 DIAGNOSIS — N2581 Secondary hyperparathyroidism of renal origin: Secondary | ICD-10-CM | POA: Diagnosis not present

## 2015-08-20 DIAGNOSIS — D631 Anemia in chronic kidney disease: Secondary | ICD-10-CM | POA: Diagnosis not present

## 2015-08-20 DIAGNOSIS — N186 End stage renal disease: Secondary | ICD-10-CM | POA: Diagnosis not present

## 2015-08-20 DIAGNOSIS — E119 Type 2 diabetes mellitus without complications: Secondary | ICD-10-CM | POA: Diagnosis not present

## 2015-08-20 DIAGNOSIS — D509 Iron deficiency anemia, unspecified: Secondary | ICD-10-CM | POA: Diagnosis not present

## 2015-08-20 DIAGNOSIS — N2581 Secondary hyperparathyroidism of renal origin: Secondary | ICD-10-CM | POA: Diagnosis not present

## 2015-08-24 ENCOUNTER — Encounter (HOSPITAL_COMMUNITY): Payer: Self-pay | Admitting: Emergency Medicine

## 2015-08-24 ENCOUNTER — Emergency Department (HOSPITAL_COMMUNITY): Payer: Medicare Other

## 2015-08-24 ENCOUNTER — Emergency Department (HOSPITAL_COMMUNITY)
Admission: EM | Admit: 2015-08-24 | Discharge: 2015-08-24 | Disposition: A | Discharge status: Home / Self Care | Payer: Medicare Other | Attending: Emergency Medicine | Admitting: Emergency Medicine

## 2015-08-24 DIAGNOSIS — Z79899 Other long term (current) drug therapy: Secondary | ICD-10-CM | POA: Insufficient documentation

## 2015-08-24 DIAGNOSIS — S0990XA Unspecified injury of head, initial encounter: Secondary | ICD-10-CM

## 2015-08-24 DIAGNOSIS — N186 End stage renal disease: Secondary | ICD-10-CM | POA: Insufficient documentation

## 2015-08-24 DIAGNOSIS — S0083XA Contusion of other part of head, initial encounter: Secondary | ICD-10-CM | POA: Insufficient documentation

## 2015-08-24 DIAGNOSIS — Z8673 Personal history of transient ischemic attack (TIA), and cerebral infarction without residual deficits: Secondary | ICD-10-CM | POA: Diagnosis not present

## 2015-08-24 DIAGNOSIS — S0003XA Contusion of scalp, initial encounter: Secondary | ICD-10-CM | POA: Diagnosis not present

## 2015-08-24 DIAGNOSIS — I5030 Unspecified diastolic (congestive) heart failure: Secondary | ICD-10-CM | POA: Diagnosis not present

## 2015-08-24 DIAGNOSIS — E039 Hypothyroidism, unspecified: Secondary | ICD-10-CM | POA: Diagnosis not present

## 2015-08-24 DIAGNOSIS — F17210 Nicotine dependence, cigarettes, uncomplicated: Secondary | ICD-10-CM | POA: Insufficient documentation

## 2015-08-24 DIAGNOSIS — I132 Hypertensive heart and chronic kidney disease with heart failure and with stage 5 chronic kidney disease, or end stage renal disease: Secondary | ICD-10-CM | POA: Diagnosis not present

## 2015-08-24 DIAGNOSIS — Y999 Unspecified external cause status: Secondary | ICD-10-CM | POA: Insufficient documentation

## 2015-08-24 DIAGNOSIS — Y929 Unspecified place or not applicable: Secondary | ICD-10-CM | POA: Diagnosis not present

## 2015-08-24 DIAGNOSIS — Z7982 Long term (current) use of aspirin: Secondary | ICD-10-CM | POA: Diagnosis not present

## 2015-08-24 DIAGNOSIS — W19XXXA Unspecified fall, initial encounter: Secondary | ICD-10-CM

## 2015-08-24 DIAGNOSIS — W06XXXA Fall from bed, initial encounter: Secondary | ICD-10-CM | POA: Insufficient documentation

## 2015-08-24 DIAGNOSIS — N2581 Secondary hyperparathyroidism of renal origin: Secondary | ICD-10-CM | POA: Diagnosis not present

## 2015-08-24 DIAGNOSIS — I2510 Atherosclerotic heart disease of native coronary artery without angina pectoris: Secondary | ICD-10-CM | POA: Insufficient documentation

## 2015-08-24 DIAGNOSIS — D509 Iron deficiency anemia, unspecified: Secondary | ICD-10-CM | POA: Diagnosis not present

## 2015-08-24 DIAGNOSIS — J449 Chronic obstructive pulmonary disease, unspecified: Secondary | ICD-10-CM | POA: Insufficient documentation

## 2015-08-24 DIAGNOSIS — D631 Anemia in chronic kidney disease: Secondary | ICD-10-CM | POA: Diagnosis not present

## 2015-08-24 DIAGNOSIS — Y939 Activity, unspecified: Secondary | ICD-10-CM | POA: Insufficient documentation

## 2015-08-24 DIAGNOSIS — E119 Type 2 diabetes mellitus without complications: Secondary | ICD-10-CM | POA: Diagnosis not present

## 2015-08-24 DIAGNOSIS — Z85828 Personal history of other malignant neoplasm of skin: Secondary | ICD-10-CM | POA: Insufficient documentation

## 2015-08-24 LAB — COMPREHENSIVE METABOLIC PANEL
ALT: 14 U/L (ref 14–54)
AST: 21 U/L (ref 15–41)
Albumin: 3.4 g/dL — ABNORMAL LOW (ref 3.5–5.0)
Alkaline Phosphatase: 50 U/L (ref 38–126)
Anion gap: 11 (ref 5–15)
BUN: 44 mg/dL — ABNORMAL HIGH (ref 6–20)
CALCIUM: 9.6 mg/dL (ref 8.9–10.3)
CHLORIDE: 92 mmol/L — AB (ref 101–111)
CO2: 33 mmol/L — ABNORMAL HIGH (ref 22–32)
CREATININE: 11.72 mg/dL — AB (ref 0.44–1.00)
GFR, EST AFRICAN AMERICAN: 3 mL/min — AB (ref >60)
GFR, EST NON AFRICAN AMERICAN: 3 mL/min — AB (ref >60)
Glucose, Bld: 89 mg/dL (ref 65–99)
Potassium: 5 mmol/L (ref 3.5–5.1)
Sodium: 136 mmol/L (ref 135–145)
Total Bilirubin: 0.8 mg/dL (ref 0.3–1.2)
Total Protein: 6.2 g/dL — ABNORMAL LOW (ref 6.5–8.1)

## 2015-08-24 LAB — CBC WITH DIFFERENTIAL/PLATELET
Basophils Absolute: 0 10*3/uL (ref 0.0–0.1)
Basophils Relative: 1 %
EOS PCT: 3 %
Eosinophils Absolute: 0.1 10*3/uL (ref 0.0–0.7)
HCT: 34.2 % — ABNORMAL LOW (ref 36.0–46.0)
Hemoglobin: 10.7 g/dL — ABNORMAL LOW (ref 12.0–15.0)
LYMPHS ABS: 0.9 10*3/uL (ref 0.7–4.0)
LYMPHS PCT: 20 %
MCH: 26.4 pg (ref 26.0–34.0)
MCHC: 31.3 g/dL (ref 30.0–36.0)
MCV: 84.4 fL (ref 78.0–100.0)
MONO ABS: 0.3 10*3/uL (ref 0.1–1.0)
MONOS PCT: 6 %
NEUTROS ABS: 3.1 10*3/uL (ref 1.7–7.7)
NEUTROS PCT: 71 %
PLATELETS: 149 10*3/uL — AB (ref 150–400)
RBC: 4.05 MIL/uL (ref 3.87–5.11)
RDW: 17.1 % — ABNORMAL HIGH (ref 11.5–15.5)
WBC: 4.4 10*3/uL (ref 4.0–10.5)

## 2015-08-24 NOTE — ED Provider Notes (Signed)
TIME SEEN: 5:25 AM  CHIEF COMPLAINT: Fall, head injury  HPI: Pt is a 70 y.o. female with history of CAD, end-stage renal disease on hemodialysis Monday, Wednesday and Friday he was last dialyzed on Friday, CVA, seizures on Keppra who presents emergency department with a fall out of bed. He states that she thinks she fell out of bed while asleep. No known seizure activity. States she hit her head on the nightstand. Is not on anticoagulation, antiplatelets. Did have some mild vertigo with standing afterwards. No headache currently, numbness, tingling or focal weakness. She does have a history of a chronic cough that is unchanged. No chest pain or shortness of breath. No vomiting or diarrhea. No missed sessions of dialysis.  ROS: See HPI Constitutional: no fever  Eyes: no drainage  ENT: no runny nose   Cardiovascular:  no chest pain  Resp: no SOB  GI: no vomiting GU: no dysuria Integumentary: no rash  Allergy: no hives  Musculoskeletal: no leg swelling  Neurological: no slurred speech ROS otherwise negative  PAST MEDICAL HISTORY/PAST SURGICAL HISTORY:  Past Medical History:  Diagnosis Date  . Arthritis of shoulder    Bilateral  . CAD (coronary artery disease)    stent to RCA  . Cancer ([place])    clear cell cancer, kidney  . Chronic renal insufficiency    On hemodialysis  . Complication of anesthesia 12/2010   pt is very confused, with AMS with anesthesia  . COPD (chronic obstructive pulmonary disease) (HCC) t  . CVA 07/27/2008   CVA affected cognition and memory per family, no focal deficits.    . CVA (cerebral infarction) 2003   no apparent residual  . Depression   . Diastolic congestive heart failure ([place])   . Dyslipidemia   . Encephalopathy   . ESRD 07/27/2008   ESRD due to HTN and NSAID's, started hemodialysis in 2005 in [place], [place]. Went to [facility] from 2010 to 2012 and since 2012 has been getting dialysis at [facility] on [address] in [place] on a MWF  schedule. First access with RUA AVG placed in [place]. Next and current access was LUA AVG placed by Dr. Lucky Cowboy in [place] in or around 2012. Has had 2 or 3 procedures on that graft since placed per family. She gets her access work done here in [place] now. She is allergic to heparin and does not get any heparin at dialysis; she had an allergic reaction apparently when in ICU in the past.      . Hyperlipidemia   . Hypertension   . Hypothyroidism   . Positive PPD    completed rifampin  . PVD (peripheral vascular disease) ([place])   . Seizures ([place])     MEDICATIONS:  Prior to Admission medications   Medication Sig Start Date End Date Taking? Authorizing Provider  acetaminophen (TYLENOL) 500 MG tablet Take 1,000 mg by mouth daily as needed for mild pain.     Historical Provider, MD  albuterol (PROVENTIL HFA;VENTOLIN HFA) 108 (90 BASE) MCG/ACT inhaler Inhale 1-2 puffs into the lungs every 6 (six) hours as needed for wheezing or shortness of breath. Patient not taking: Reported on 07/20/2015 10/06/13   Linton Flemings, MD  amLODipine (NORVASC) 10 MG tablet Take 10 mg by mouth at bedtime.     Historical Provider, MD  aspirin EC 81 MG tablet Take 81 mg by mouth daily.    Historical Provider, MD  carvedilol (COREG) 25 MG tablet Take 25 mg by mouth 2 (two)  times daily with a meal.    Historical Provider, MD  diphenhydrAMINE (BENADRYL) 50 MG capsule Take 50 mg by mouth on 12/24/14, at 6:30 am. Patient taking differently: Take 50 mg by mouth every Monday, Wednesday, and Friday. On dialysis days 12/24/14   Angelia Mould, MD  hydrALAZINE (APRESOLINE) 50 MG tablet Take 50 mg by mouth 3 (three) times daily.    Historical Provider, MD  hydrOXYzine (ATARAX/VISTARIL) 25 MG tablet Take 25 mg by mouth daily as needed for itching. Reported on 12/16/2014 03/18/14   Historical Provider, MD  Shela Leff C-NR 100-10 MG/5ML syrup  01/13/15   Historical Provider, MD  isosorbide mononitrate (IMDUR) 30 MG 24 hr tablet Take  1 tablet (30 mg total) by mouth daily. PLEASE  CONTACT OFFICE FOR ADDITIONAL REFILLS 07/01/15   Minus Breeding, MD  LETAIRIS 5 MG tablet TAKE 1 TABLET (5 MG) ORALLY DAILY. DO NOT HANDLE IF PREGNANT. DO NOT SPLIT, CRUSH OR CHEW. AVOID INHALATION AND CONTACT WITH SKIN OR EYE. RE 05/10/15   Juanito Doom, MD  levETIRAcetam (KEPPRA) 500 MG tablet Take 500 mg by mouth daily at 8 pm.    Historical Provider, MD  levothyroxine (SYNTHROID, LEVOTHROID) 88 MCG tablet Take 88 mcg by mouth daily before breakfast.    Historical Provider, MD  multivitamin (RENA-VIT) TABS tablet Take 1 tablet by mouth daily.    Historical Provider, MD  NITROSTAT 0.4 MG SL tablet Place 0.4 mg under the tongue every 5 (five) minutes as needed for chest pain.  09/16/10   Historical Provider, MD  omeprazole (PRILOSEC OTC) 20 MG tablet Take 20 mg by mouth daily.    Historical Provider, MD  pravastatin (PRAVACHOL) 40 MG tablet Take 40 mg by mouth at bedtime.     Historical Provider, MD  predniSONE (DELTASONE) 20 MG tablet Reported on 07/20/2015 01/25/15   Historical Provider, MD  predniSONE (DELTASONE) 50 MG tablet One tablet (50mg ) 12/23/14 at 6:30 pm; one tablet (50mg ) 12/24/14 at 12:30 am and then one tablet (50 mg) 12/24/14 at 6:30 am. Patient not taking: Reported on 07/20/2015 12/23/14   Angelia Mould, MD  predniSONE (DELTASONE) 50 MG tablet Prednisone 50 mg po 13 hrs, 7 hrs and 1 hr prior to CT (scheduled for 06/08/2015 at 8am) Patient not taking: Reported on 07/20/2015 06/07/15   Aletta Edouard, MD  SENSIPAR 90 MG tablet Take 90 mg by mouth at bedtime.  03/05/14   Historical Provider, MD  sevelamer carbonate (RENVELA) 800 MG tablet Take 1,600-3,200 mg by mouth 3 (three) times daily. 4 tabs TID, 2 tabs with each snack    Historical Provider, MD  traMADol (ULTRAM) 50 MG tablet Take 50 mg by mouth daily as needed for moderate pain.  04/05/14   Historical Provider, MD  traZODone (DESYREL) 50 MG tablet Take 50 mg by mouth at bedtime as  needed for sleep.     Historical Provider, MD    ALLERGIES:  Allergies  Allergen Reactions  . Ace Inhibitors Anaphylaxis and Rash  . Heparin Other (See Comments)    MDs told her not to take after reaction in ICU  . Iohexol Swelling and Other (See Comments)    1970s; passed out and had facial/tongue swelling.  Requires 13-hour prep with prednisone and benadryl  . Phenytoin Other (See Comments)    Had reaction while in ICU; doesn't know.  MDs told her not to take ever again.  [name] Phenytoin Sodium Extended Other (See Comments)  . Wellbutrin [Bupropion] Other (See Comments)  seizures  . Ampicillin Hives and Rash  . Meperidine Hcl Swelling and Rash    Makes tongue swell  . Morphine Rash  . Penicillins Rash    Has patient had a PCN reaction causing immediate rash, facial/tongue/throat swelling, SOB or lightheadedness with hypotension: Yes Has patient had a PCN reaction causing severe rash involving mucus membranes or skin necrosis: No Has patient had a PCN reaction that required hospitalization No Has patient had a PCN reaction occurring within the last 10 years: No If all of the above answers are "NO", then may proceed with Cephalosporin use.    [name] Pentazocine Rash  . Valproic Acid And Related Other (See Comments)    Confusion   . Iodinated Diagnostic Agents Other (See Comments)    Pre-meditate with benadryl and prednisone 3 times before appt.  . Pentazocine Lactate Other (See Comments)    Patient does not remember reaction to this med (Talwin).     SOCIAL HISTORY:  Social History  Substance Use Topics  . Smoking status: Current Every Day Smoker    Packs/day: 0.50    Years: 53.00    Types: Cigarettes  . Smokeless tobacco: Never Used     Comment: down to .5-.75 ppd 06/01/15  . Alcohol use 0.0 oz/week     Comment: very rarely per pt    FAMILY HISTORY: Family History  Problem Relation Age of Onset  . Heart disease Father   . Hypertension Mother   . Dementia Mother   .  Coronary artery disease Sister   . Heart attack Sister   . Hypertension Brother     EXAM: BP 192/86 (BP Location: Right Arm)   Pulse 68   Temp 97.5 F (36.4 C) (Oral)   Resp 19   SpO2 93%  CONSTITUTIONAL: Alert and oriented 3 and responds appropriately to questions. Well-appearing; well-nourished; GCS 37, elderly HEAD: Normocephalic;Patient has a small hematoma to the right forehead, no sign of skull fracture on exam EYES: Conjunctivae clear, PERRL, EOMI, no raccoon eyes ENT: normal nose; no rhinorrhea; moist mucous membranes; pharynx without lesions noted; no dental injury; no septal hematoma NECK: Supple, no meningismus, no LAD; no midline spinal tenderness, step-off or deformity CARD: RRR; S1 and S2 appreciated; no murmurs, no clicks, no rubs, no gallops RESP: Normal chest excursion without splinting or tachypnea; breath sounds clear and equal bilaterally; no wheezes, no rhonchi, no rales; no hypoxia or respiratory distress CHEST:  chest wall stable, no crepitus or ecchymosis or deformity, nontender to palpation ABD/GI: Normal bowel sounds; non-distended; soft, non-tender, no rebound, no guarding PELVIS:  stable, nontender to palpation BACK:  The back appears normal and is non-tender to palpation, there is no CVA tenderness; no midline spinal tenderness, step-off or deformity EXT: Normal ROM in all joints; non-tender to palpation; no edema; normal capillary refill; no cyanosis, no bony tenderness or bony deformity of patient's extremities, no joint effusion, no ecchymosis or lacerations, AV fistula in the left upper extremity with good thrill and bruit, 2+ radial pulses bilaterally, no erythema or warmth over the AV fistula    SKIN: Normal color for age and race; warm NEURO: Moves all extremities equally, sensation to light touch intact diffusely, cranial nerves II through XII intact PSYCH: The patient's mood and manner are appropriate. Grooming and personal hygiene are  appropriate.  MEDICAL DECISION MAKING: Patient here with fall out of bed. States she thinks she was asleep and rolled out of bed. She denies any preceding symptoms. Denies any seizure-like activity.  Has no current complaints other than hematoma to the right forehead. No other sign of trauma on exam. Does not appear volume overloaded. No recent infectious symptoms. Denies any preceding chest pain or shortness of breath. EKG shows no new ischemic abnormality or arrhythmia. We'll check basic labs, CT of her head.  ED PROGRESS: Labs show no acute abnormality. Head CT unremarkable for acute injury. Patient is still on her neurologic baseline. Plan is to discharge patient home with her daughter. They're comfortable with this plan.   At this time, I do not feel there is any life-threatening condition present. I have reviewed and discussed all results (EKG, imaging, lab, urine as appropriate), exam findings with patient/family. I have reviewed nursing notes and appropriate previous records.  I feel the patient is safe to be discharged home without further emergent workup and can continue workup as an outpatient. Discussed usual and customary return precautions. Patient/family verbalize understanding and are comfortable with this plan.  Outpatient follow-up has been provided. All questions have been answered.   EKG Interpretation  Date/Time:  Tuesday August 24 2015 04:38:47 EDT Ventricular Rate:  73 PR Interval:  194 QRS Duration: 88 QT Interval:  438 QTC Calculation: 482 R Axis:   -61 Text Interpretation:  Normal sinus rhythm with sinus arrhythmia Left axis deviation Anterior infarct , age undetermined Abnormal ECG No significant change since last tracing Confirmed by Prentiss Hammett,  DO, Raizy Auzenne [phone]) on 08/24/2015 5:54:18 AM         [place], DO 08/24/15 0630

## 2015-08-24 NOTE — ED Triage Notes (Signed)
Pt. lost her balance while on bed this morning , hit her head against night stand , denies LOC , alert and oriented / presents with mild swelling at right forehead . Hemodialysis q Mon/Wed/Friday her last treatment was Friday last week .

## 2015-08-25 DIAGNOSIS — N186 End stage renal disease: Secondary | ICD-10-CM | POA: Diagnosis not present

## 2015-08-25 DIAGNOSIS — D631 Anemia in chronic kidney disease: Secondary | ICD-10-CM | POA: Diagnosis not present

## 2015-08-25 DIAGNOSIS — E119 Type 2 diabetes mellitus without complications: Secondary | ICD-10-CM | POA: Diagnosis not present

## 2015-08-25 DIAGNOSIS — D509 Iron deficiency anemia, unspecified: Secondary | ICD-10-CM | POA: Diagnosis not present

## 2015-08-25 DIAGNOSIS — N2581 Secondary hyperparathyroidism of renal origin: Secondary | ICD-10-CM | POA: Diagnosis not present

## 2015-08-27 DIAGNOSIS — E119 Type 2 diabetes mellitus without complications: Secondary | ICD-10-CM | POA: Diagnosis not present

## 2015-08-27 DIAGNOSIS — D631 Anemia in chronic kidney disease: Secondary | ICD-10-CM | POA: Diagnosis not present

## 2015-08-27 DIAGNOSIS — N2581 Secondary hyperparathyroidism of renal origin: Secondary | ICD-10-CM | POA: Diagnosis not present

## 2015-08-27 DIAGNOSIS — N186 End stage renal disease: Secondary | ICD-10-CM | POA: Diagnosis not present

## 2015-08-27 DIAGNOSIS — D509 Iron deficiency anemia, unspecified: Secondary | ICD-10-CM | POA: Diagnosis not present

## 2015-08-30 DIAGNOSIS — D509 Iron deficiency anemia, unspecified: Secondary | ICD-10-CM | POA: Diagnosis not present

## 2015-08-30 DIAGNOSIS — N2581 Secondary hyperparathyroidism of renal origin: Secondary | ICD-10-CM | POA: Diagnosis not present

## 2015-08-30 DIAGNOSIS — E119 Type 2 diabetes mellitus without complications: Secondary | ICD-10-CM | POA: Diagnosis not present

## 2015-08-30 DIAGNOSIS — N186 End stage renal disease: Secondary | ICD-10-CM | POA: Diagnosis not present

## 2015-08-30 DIAGNOSIS — D631 Anemia in chronic kidney disease: Secondary | ICD-10-CM | POA: Diagnosis not present

## 2015-09-01 ENCOUNTER — Ambulatory Visit: Payer: Medicare Other | Admitting: Pulmonary Disease

## 2015-09-01 ENCOUNTER — Ambulatory Visit: Payer: Medicare Other

## 2015-09-01 DIAGNOSIS — E119 Type 2 diabetes mellitus without complications: Secondary | ICD-10-CM | POA: Diagnosis not present

## 2015-09-01 DIAGNOSIS — D631 Anemia in chronic kidney disease: Secondary | ICD-10-CM | POA: Diagnosis not present

## 2015-09-01 DIAGNOSIS — N186 End stage renal disease: Secondary | ICD-10-CM | POA: Diagnosis not present

## 2015-09-01 DIAGNOSIS — N2581 Secondary hyperparathyroidism of renal origin: Secondary | ICD-10-CM | POA: Diagnosis not present

## 2015-09-01 DIAGNOSIS — D509 Iron deficiency anemia, unspecified: Secondary | ICD-10-CM | POA: Diagnosis not present

## 2015-09-02 ENCOUNTER — Ambulatory Visit: Payer: Medicare Other | Admitting: Pulmonary Disease

## 2015-09-02 ENCOUNTER — Ambulatory Visit (INDEPENDENT_AMBULATORY_CARE_PROVIDER_SITE_OTHER): Payer: Medicare Other | Admitting: Pulmonary Disease

## 2015-09-02 ENCOUNTER — Encounter: Payer: Self-pay | Admitting: Pulmonary Disease

## 2015-09-02 DIAGNOSIS — I272 Other secondary pulmonary hypertension: Secondary | ICD-10-CM | POA: Diagnosis not present

## 2015-09-02 DIAGNOSIS — I70213 Atherosclerosis of native arteries of extremities with intermittent claudication, bilateral legs: Secondary | ICD-10-CM | POA: Diagnosis not present

## 2015-09-02 DIAGNOSIS — E1129 Type 2 diabetes mellitus with other diabetic kidney complication: Secondary | ICD-10-CM | POA: Diagnosis not present

## 2015-09-02 DIAGNOSIS — R0600 Dyspnea, unspecified: Secondary | ICD-10-CM | POA: Diagnosis not present

## 2015-09-02 DIAGNOSIS — Z992 Dependence on renal dialysis: Secondary | ICD-10-CM | POA: Diagnosis not present

## 2015-09-02 DIAGNOSIS — N186 End stage renal disease: Secondary | ICD-10-CM | POA: Diagnosis not present

## 2015-09-02 MED ORDER — ALBUTEROL SULFATE HFA 108 (90 BASE) MCG/ACT IN AERS: 1.0000 | INHALATION_SPRAY | Freq: Four times a day (QID) | RESPIRATORY_TRACT | 5 refills | Status: DC | PRN

## 2015-09-02 MED ORDER — NITROGLYCERIN 0.4 MG SL SUBL
0.4000 mg | SUBLINGUAL_TABLET | SUBLINGUAL | 0 refills | Status: DC | PRN
Start: 2015-09-02 — End: 2016-06-12

## 2015-09-02 MED ORDER — BECLOMETHASONE DIPROPIONATE 80 MCG/ACT IN AERS: 2.0000 | INHALATION_SPRAY | Freq: Two times a day (BID) | RESPIRATORY_TRACT | 0 refills | Status: DC

## 2015-09-02 NOTE — Progress Notes (Signed)
History of Present Illness [name] is a 70 y.o. female with moderate to severe pulmonary hypertension.  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  PFTs normal.  Active smoker. Secondary Pulmonary hypertension, ESRD, CHF 04/08/2013 full pulmonary function test> ratio 77%, FEV1 1.44 L (80% predicted), total lung capacity 3.55 (72% predicted), ERV 0.23 L (129% predicted), DLCO 10.95 (47% predicted) 05/2013 Echo> LVEF 35-40% (diffuse global hypokinesis), RV severely dilated, RVSP 139mHg, RA dilated 05/2013 6MW> 240 meters, O2 saturaiton 99% exercise 05/2013 CT chest > no ILD, definite heart failure and CAD 1/26 RHC > RA mean 1 RV 52/1 PA 52/20, mean 30 PCWP mean 6  Oxygen saturations: PA 70% AO 95%  Cardiac Output (Fick) 5.77  Cardiac Index (Fick) 3.32 PVR 4.2 WU  Cardiac Output (Thermo) 5.12 Cardiac Index (Thermo) 2.94 PVR 4.7 WU 03/2014 ANA neg, HIV neg, SCL-70 neg, RNP neg, B2 glycoprotein neg, DS DNA neg 05/2014 6MW> 329m (98% RA) 07/2015 Echo EF 45-50%, PA Peak pressure was 86 mm HG 08/2015 6MW>>  09/02/2015  Pt. Presents to the office for follow up. She had her 6 minute walk today and states that she did not get short of breath, but her legs were bothering her. She is compliant with her Lataris 5 mg daily. She states she is not having significant shortness of breath, but notes shortness of breath with walking significant distance.She has a walker that she uses very rarely. She denies chest pain, fever, orthopnea or hemoptysis. She has had a stable interval since last appointment. She did fall earlier this month, but was checked out at the hospital and cleared. She suspects Dr. Lake [place] is going to increase her Lataris, but states she does not want to increase her dose. 6MW distance has decreased significantly since May,, but she states it is her legs and not her lungs.  Tests 07/2015: Echo: EF 45-50%, PA Peak pressure was 86 mm HG   Past medical hx Past  Medical History:  Diagnosis Date  . Arthritis of shoulder    Bilateral  . CAD (coronary artery disease)    stent to RCA  . Cancer ([name])    clear cell cancer, kidney  . Chronic renal insufficiency    On hemodialysis  . Complication of anesthesia 12/2010   pt is very confused, with AMS with anesthesia  . COPD (chronic obstructive pulmonary disease) (HCC) t  . CVA 07/27/2008   CVA affected cognition and memory per family, no focal deficits.    . CVA (cerebral infarction) 2003   no apparent residual  . Depression   . Diastolic congestive heart failure ([place])   . Dyslipidemia   . Encephalopathy   . ESRD 07/27/2008   ESRD due to HTN and NSAID's, [name] 2010 to 2012 and since 2012 [place] at [name] MWF schedule. First access with RUA AVG placed in [name]. Next and current access was LUA AVG placed by Dr. Lucky Cowboy in [name] in or around 2012. Has had 2 or 3 procedures on that graft since placed per family. She gets her access work done here in [name] now. She is allergic to heparin and does not get any heparin at dialysis; she had an allergic reaction apparently when in ICU in the past.      . Hyperlipidemia   . Hypertension   . Hypothyroidism   .  Positive PPD    completed rifampin  . PVD (peripheral vascular disease) ([name])   . Seizures ([name])      Past surgical hx, Family hx, Social hx all reviewed.  Current Outpatient Prescriptions on File Prior to Visit  Medication Sig  . acetaminophen (TYLENOL) 500 MG tablet Take 1,000 mg by mouth daily as needed for mild pain.   [name] albuterol (PROVENTIL HFA;VENTOLIN HFA) 108 (90 BASE) MCG/ACT inhaler Inhale 1-2 puffs into the lungs every 6 (six) hours as needed for wheezing or shortness of breath.  [name] amLODipine (NORVASC) 10 MG tablet Take 10 mg by mouth at bedtime.   [name] aspirin EC 81 MG tablet Take 81 mg by mouth daily.    . carvedilol (COREG) 25 MG tablet Take 25 mg by mouth 2 (two) times daily with a meal.  . diphenhydrAMINE (BENADRYL) 50 MG capsule Take 50 mg by mouth on 12/24/14, at 6:30 am. (Patient taking differently: Take 50 mg by mouth every Monday, Wednesday, and Friday. On dialysis days)  . hydrALAZINE (APRESOLINE) 50 MG tablet Take 50 mg by mouth 3 (three) times daily.  . hydrOXYzine (ATARAX/VISTARIL) 25 MG tablet Take 25 mg by mouth daily as needed for itching. Reported on 12/16/2014  . IOPHEN C-NR 100-10 MG/5ML syrup   . isosorbide mononitrate (IMDUR) 30 MG 24 hr tablet Take 1 tablet (30 mg total) by mouth daily. PLEASE  CONTACT OFFICE FOR ADDITIONAL REFILLS  . LETAIRIS 5 MG tablet TAKE 1 TABLET (5 MG) ORALLY DAILY. DO NOT HANDLE IF PREGNANT. DO NOT SPLIT, CRUSH OR CHEW. AVOID INHALATION AND CONTACT WITH SKIN OR EYE. RE  . levETIRAcetam (KEPPRA) 500 MG tablet Take 500 mg by mouth daily at 8 pm.  . levothyroxine (SYNTHROID, LEVOTHROID) 88 MCG tablet Take 88 mcg by mouth daily before breakfast.  . multivitamin (RENA-VIT) TABS tablet Take 1 tablet by mouth daily.  [name] NITROSTAT 0.4 MG SL tablet Place 0.4 mg under the tongue every 5 (five) minutes as needed for chest pain.   [name] omeprazole (PRILOSEC OTC) 20 MG tablet Take 20 mg by mouth daily.  . pravastatin (PRAVACHOL) 40 MG tablet Take 40 mg by mouth at bedtime.   . SENSIPAR 90 MG tablet Take 90 mg by mouth at bedtime.   . sevelamer carbonate (RENVELA) 800 MG tablet Take 1,600-3,200 mg by mouth 3 (three) times daily. 4 tabs TID, 2 tabs with each snack  . traMADol (ULTRAM) 50 MG tablet Take 50 mg by mouth daily as needed for moderate pain.   . traZODone (DESYREL) 50 MG tablet Take 50 mg by mouth at bedtime as needed for sleep.    No current facility-administered medications on file prior to visit.      Allergies  Allergen Reactions  . Ace Inhibitors Anaphylaxis and Rash  . Heparin Other (See Comments)    MDs told her not to take after reaction in ICU   . Iohexol Swelling and Other (See Comments)    1970s; passed out and had facial/tongue swelling.  Requires 13-hour prep with prednisone and benadryl  . Phenytoin Other (See Comments)    Had reaction while in ICU; doesn't know.  MDs told her not to take ever again.  [name] Phenytoin Sodium Extended Other (See Comments)  . Wellbutrin [Bupropion] Other (See Comments)    seizures  . Ampicillin Hives and Rash  . Meperidine Hcl Swelling and Rash    Makes tongue swell  . Morphine Rash  . Penicillins Rash    Has patient  had a PCN reaction causing immediate rash, facial/tongue/throat swelling, SOB or lightheadedness with hypotension: Yes Has patient had a PCN reaction causing severe rash involving mucus membranes or skin necrosis: No Has patient had a PCN reaction that required hospitalization No Has patient had a PCN reaction occurring within the last 10 years: No If all of the above answers are "NO", then may proceed with Cephalosporin use.    [name] Pentazocine Rash  . Valproic Acid And Related Other (See Comments)    Confusion   . Iodinated Diagnostic Agents Other (See Comments)    Pre-meditate with benadryl and prednisone 3 times before appt.  . Pentazocine Lactate Other (See Comments)    Patient does not remember reaction to this med (Talwin).     Review Of Systems:  Constitutional:   No  weight loss, night sweats,  Fevers, chills, fatigue, or  lassitude.  HEENT:   No headaches,  Difficulty swallowing,  Tooth/dental problems, or  Sore throat,                No sneezing, itching, ear ache, nasal congestion, post nasal drip,   CV:  No chest pain,  Orthopnea, PND, swelling in lower extremities, anasarca, dizziness, palpitations, syncope.   GI  No heartburn, indigestion, abdominal pain, nausea, vomiting, diarrhea, change in bowel habits, loss of appetite, bloody stools.   Resp: + shortness of breath with exertion not  at rest.  No excess mucus, no productive cough,  No non-productive cough,   No coughing up of blood.  No change in color of mucus.  No wheezing.  No chest wall deformity  Skin: no rash or lesions.  GU: no dysuria, change in color of urine, no urgency or frequency.  No flank pain, no hematuria   MS:  No joint pain or swelling.  No decreased range of motion.  No back pain.  Psych:  No change in mood or affect. No depression or anxiety.  No memory loss.   Vital Signs BP 126/70 (BP Location: Left Arm, Cuff Size: Normal)   Pulse 60   Ht 5' 2.5" (1.588 m)   Wt 156 lb (70.8 kg)   SpO2 98%   BMI 28.08 kg/m    Physical Exam:  General- No distress,  A&Ox3, pleasant. ENT: No sinus tenderness, TM clear, pale nasal mucosa, no oral exudate,no post nasal drip, no LAN Cardiac: S1, S2, regular rate and rhythm, no murmur Chest: No wheeze/ rales/ dullness; no accessory muscle use, no nasal flaring, no sternal retractions Abd.: Soft Non-tender Ext: No clubbing cyanosis, edema Neuro:  normal strength Skin: No rashes, warm and dry Psych: normal mood and behavior   Assessment/Plan  Pulmonary hypertension (HCC) Stable interval Compliant with Lataris 5 mg. Plan: We will renew your nitroglycerine today. Continue your Lataris 5 mg daily as you have been doing. We will send in a prescription for your proventil. Inhale 1-2 breaths as needed up to every 6 hours for wheezing or shortness of breath. Follow up with Dr. [place] in 3 months. Please contact office for sooner follow up if symptoms do not improve or worsen or seek emergency care.     Magdalen Spatz, NP 09/02/2015  4:41 PM

## 2015-09-02 NOTE — Assessment & Plan Note (Signed)
Stable interval Compliant with Lataris 5 mg. Plan: We will renew your nitroglycerine today. Continue your Lataris 5 mg daily as you have been doing. We will send in a prescription for your proventil. Inhale 1-2 breaths as needed up to every 6 hours for wheezing or shortness of breath. Follow up with Dr. Lake Bells in 3 months. Please contact office for sooner follow up if symptoms do not improve or worsen or seek emergency care.

## 2015-09-02 NOTE — Progress Notes (Signed)
Attending:  I have seen and examined the patient with nurse practitioner/resident and agree with the note above.  We formulated the plan together and I elicited the following history.    She has been feeling well recently, no shortness of breath. She staying active. Her 6 minute walk distance is down today due to pain in her legs. Her echocardiogram showed improvement in her RVSP but still markedly elevated  On exam: Lungs clear to auscultation Trace edema in legs  Pulmonary hypertension: Ideally I would like to escalate the dose of her Letairis but she refused. She doesn't want to take any more medicine and she says she feels great. Because her echocardiogram numbers grossly overestimate the actual PA pressure when you look at her prior right heart catheterization I think it's reasonable to continue Letairis at a minimal dose. However, she needs to undergo surgery in the future I encouraged her to let us know because I would like to increase the Letairis to have her optimized. For now will continue at the current dose.

## 2015-09-02 NOTE — Patient Instructions (Addendum)
It is nice to meet you. We will renew your nitroglycerine today. Continue your Lataris 5 mg daily as you have been doing. We will send in a prescription for your proventil. Inhale 1-2 breaths as needed up to every 6 hours for wheezing or shortness of breath. Follow up with Dr. Lake Bells in 3 months. Please contact office for sooner follow up if symptoms do not improve or worsen or seek emergency care.

## 2015-09-03 DIAGNOSIS — D509 Iron deficiency anemia, unspecified: Secondary | ICD-10-CM | POA: Diagnosis not present

## 2015-09-03 DIAGNOSIS — E119 Type 2 diabetes mellitus without complications: Secondary | ICD-10-CM | POA: Diagnosis not present

## 2015-09-03 DIAGNOSIS — N2581 Secondary hyperparathyroidism of renal origin: Secondary | ICD-10-CM | POA: Diagnosis not present

## 2015-09-03 DIAGNOSIS — N186 End stage renal disease: Secondary | ICD-10-CM | POA: Diagnosis not present

## 2015-09-03 DIAGNOSIS — Z23 Encounter for immunization: Secondary | ICD-10-CM | POA: Diagnosis not present

## 2015-09-03 DIAGNOSIS — D631 Anemia in chronic kidney disease: Secondary | ICD-10-CM | POA: Diagnosis not present

## 2015-09-06 DIAGNOSIS — N186 End stage renal disease: Secondary | ICD-10-CM | POA: Diagnosis not present

## 2015-09-06 DIAGNOSIS — D509 Iron deficiency anemia, unspecified: Secondary | ICD-10-CM | POA: Diagnosis not present

## 2015-09-06 DIAGNOSIS — D631 Anemia in chronic kidney disease: Secondary | ICD-10-CM | POA: Diagnosis not present

## 2015-09-06 DIAGNOSIS — Z23 Encounter for immunization: Secondary | ICD-10-CM | POA: Diagnosis not present

## 2015-09-06 DIAGNOSIS — N2581 Secondary hyperparathyroidism of renal origin: Secondary | ICD-10-CM | POA: Diagnosis not present

## 2015-09-06 DIAGNOSIS — E119 Type 2 diabetes mellitus without complications: Secondary | ICD-10-CM | POA: Diagnosis not present

## 2015-09-08 DIAGNOSIS — D509 Iron deficiency anemia, unspecified: Secondary | ICD-10-CM | POA: Diagnosis not present

## 2015-09-08 DIAGNOSIS — N186 End stage renal disease: Secondary | ICD-10-CM | POA: Diagnosis not present

## 2015-09-08 DIAGNOSIS — N2581 Secondary hyperparathyroidism of renal origin: Secondary | ICD-10-CM | POA: Diagnosis not present

## 2015-09-08 DIAGNOSIS — E119 Type 2 diabetes mellitus without complications: Secondary | ICD-10-CM | POA: Diagnosis not present

## 2015-09-08 DIAGNOSIS — Z23 Encounter for immunization: Secondary | ICD-10-CM | POA: Diagnosis not present

## 2015-09-08 DIAGNOSIS — D631 Anemia in chronic kidney disease: Secondary | ICD-10-CM | POA: Diagnosis not present

## 2015-09-10 DIAGNOSIS — N2581 Secondary hyperparathyroidism of renal origin: Secondary | ICD-10-CM | POA: Diagnosis not present

## 2015-09-10 DIAGNOSIS — D509 Iron deficiency anemia, unspecified: Secondary | ICD-10-CM | POA: Diagnosis not present

## 2015-09-10 DIAGNOSIS — Z23 Encounter for immunization: Secondary | ICD-10-CM | POA: Diagnosis not present

## 2015-09-10 DIAGNOSIS — D631 Anemia in chronic kidney disease: Secondary | ICD-10-CM | POA: Diagnosis not present

## 2015-09-10 DIAGNOSIS — N186 End stage renal disease: Secondary | ICD-10-CM | POA: Diagnosis not present

## 2015-09-10 DIAGNOSIS — E119 Type 2 diabetes mellitus without complications: Secondary | ICD-10-CM | POA: Diagnosis not present

## 2015-09-13 DIAGNOSIS — Z23 Encounter for immunization: Secondary | ICD-10-CM | POA: Diagnosis not present

## 2015-09-13 DIAGNOSIS — E119 Type 2 diabetes mellitus without complications: Secondary | ICD-10-CM | POA: Diagnosis not present

## 2015-09-13 DIAGNOSIS — N2581 Secondary hyperparathyroidism of renal origin: Secondary | ICD-10-CM | POA: Diagnosis not present

## 2015-09-13 DIAGNOSIS — D509 Iron deficiency anemia, unspecified: Secondary | ICD-10-CM | POA: Diagnosis not present

## 2015-09-13 DIAGNOSIS — N186 End stage renal disease: Secondary | ICD-10-CM | POA: Diagnosis not present

## 2015-09-13 DIAGNOSIS — D631 Anemia in chronic kidney disease: Secondary | ICD-10-CM | POA: Diagnosis not present

## 2015-09-15 ENCOUNTER — Telehealth: Payer: Self-pay | Admitting: Pulmonary Disease

## 2015-09-15 DIAGNOSIS — D631 Anemia in chronic kidney disease: Secondary | ICD-10-CM | POA: Diagnosis not present

## 2015-09-15 DIAGNOSIS — N2581 Secondary hyperparathyroidism of renal origin: Secondary | ICD-10-CM | POA: Diagnosis not present

## 2015-09-15 DIAGNOSIS — D509 Iron deficiency anemia, unspecified: Secondary | ICD-10-CM | POA: Diagnosis not present

## 2015-09-15 DIAGNOSIS — N186 End stage renal disease: Secondary | ICD-10-CM | POA: Diagnosis not present

## 2015-09-15 DIAGNOSIS — Z23 Encounter for immunization: Secondary | ICD-10-CM | POA: Diagnosis not present

## 2015-09-15 DIAGNOSIS — E119 Type 2 diabetes mellitus without complications: Secondary | ICD-10-CM | POA: Diagnosis not present

## 2015-09-15 NOTE — Telephone Encounter (Signed)
Called [facility] back and gave verbal ok to refill Letairis based upon BQ's last OV note.    Nothing further needed.

## 2015-09-16 ENCOUNTER — Other Ambulatory Visit: Payer: Self-pay

## 2015-09-16 MED ORDER — AMBRISENTAN 5 MG PO TABS: ORAL_TABLET | ORAL | 5 refills | Status: DC

## 2015-09-17 DIAGNOSIS — N2581 Secondary hyperparathyroidism of renal origin: Secondary | ICD-10-CM | POA: Diagnosis not present

## 2015-09-17 DIAGNOSIS — Z23 Encounter for immunization: Secondary | ICD-10-CM | POA: Diagnosis not present

## 2015-09-17 DIAGNOSIS — N186 End stage renal disease: Secondary | ICD-10-CM | POA: Diagnosis not present

## 2015-09-17 DIAGNOSIS — E119 Type 2 diabetes mellitus without complications: Secondary | ICD-10-CM | POA: Diagnosis not present

## 2015-09-17 DIAGNOSIS — D509 Iron deficiency anemia, unspecified: Secondary | ICD-10-CM | POA: Diagnosis not present

## 2015-09-17 DIAGNOSIS — D631 Anemia in chronic kidney disease: Secondary | ICD-10-CM | POA: Diagnosis not present

## 2015-09-20 DIAGNOSIS — D631 Anemia in chronic kidney disease: Secondary | ICD-10-CM | POA: Diagnosis not present

## 2015-09-20 DIAGNOSIS — Z23 Encounter for immunization: Secondary | ICD-10-CM | POA: Diagnosis not present

## 2015-09-20 DIAGNOSIS — D509 Iron deficiency anemia, unspecified: Secondary | ICD-10-CM | POA: Diagnosis not present

## 2015-09-20 DIAGNOSIS — N186 End stage renal disease: Secondary | ICD-10-CM | POA: Diagnosis not present

## 2015-09-20 DIAGNOSIS — N2581 Secondary hyperparathyroidism of renal origin: Secondary | ICD-10-CM | POA: Diagnosis not present

## 2015-09-20 DIAGNOSIS — E119 Type 2 diabetes mellitus without complications: Secondary | ICD-10-CM | POA: Diagnosis not present

## 2015-09-22 DIAGNOSIS — E119 Type 2 diabetes mellitus without complications: Secondary | ICD-10-CM | POA: Diagnosis not present

## 2015-09-22 DIAGNOSIS — Z23 Encounter for immunization: Secondary | ICD-10-CM | POA: Diagnosis not present

## 2015-09-22 DIAGNOSIS — N186 End stage renal disease: Secondary | ICD-10-CM | POA: Diagnosis not present

## 2015-09-22 DIAGNOSIS — D509 Iron deficiency anemia, unspecified: Secondary | ICD-10-CM | POA: Diagnosis not present

## 2015-09-22 DIAGNOSIS — D631 Anemia in chronic kidney disease: Secondary | ICD-10-CM | POA: Diagnosis not present

## 2015-09-22 DIAGNOSIS — N2581 Secondary hyperparathyroidism of renal origin: Secondary | ICD-10-CM | POA: Diagnosis not present

## 2015-09-24 DIAGNOSIS — D509 Iron deficiency anemia, unspecified: Secondary | ICD-10-CM | POA: Diagnosis not present

## 2015-09-24 DIAGNOSIS — E119 Type 2 diabetes mellitus without complications: Secondary | ICD-10-CM | POA: Diagnosis not present

## 2015-09-24 DIAGNOSIS — D631 Anemia in chronic kidney disease: Secondary | ICD-10-CM | POA: Diagnosis not present

## 2015-09-24 DIAGNOSIS — Z23 Encounter for immunization: Secondary | ICD-10-CM | POA: Diagnosis not present

## 2015-09-24 DIAGNOSIS — N2581 Secondary hyperparathyroidism of renal origin: Secondary | ICD-10-CM | POA: Diagnosis not present

## 2015-09-24 DIAGNOSIS — N186 End stage renal disease: Secondary | ICD-10-CM | POA: Diagnosis not present

## 2015-09-27 DIAGNOSIS — D509 Iron deficiency anemia, unspecified: Secondary | ICD-10-CM | POA: Diagnosis not present

## 2015-09-27 DIAGNOSIS — N2581 Secondary hyperparathyroidism of renal origin: Secondary | ICD-10-CM | POA: Diagnosis not present

## 2015-09-27 DIAGNOSIS — E119 Type 2 diabetes mellitus without complications: Secondary | ICD-10-CM | POA: Diagnosis not present

## 2015-09-27 DIAGNOSIS — N186 End stage renal disease: Secondary | ICD-10-CM | POA: Diagnosis not present

## 2015-09-27 DIAGNOSIS — D631 Anemia in chronic kidney disease: Secondary | ICD-10-CM | POA: Diagnosis not present

## 2015-09-27 DIAGNOSIS — Z23 Encounter for immunization: Secondary | ICD-10-CM | POA: Diagnosis not present

## 2015-09-29 DIAGNOSIS — D631 Anemia in chronic kidney disease: Secondary | ICD-10-CM | POA: Diagnosis not present

## 2015-09-29 DIAGNOSIS — E119 Type 2 diabetes mellitus without complications: Secondary | ICD-10-CM | POA: Diagnosis not present

## 2015-09-29 DIAGNOSIS — D509 Iron deficiency anemia, unspecified: Secondary | ICD-10-CM | POA: Diagnosis not present

## 2015-09-29 DIAGNOSIS — N2581 Secondary hyperparathyroidism of renal origin: Secondary | ICD-10-CM | POA: Diagnosis not present

## 2015-09-29 DIAGNOSIS — N186 End stage renal disease: Secondary | ICD-10-CM | POA: Diagnosis not present

## 2015-09-29 DIAGNOSIS — Z23 Encounter for immunization: Secondary | ICD-10-CM | POA: Diagnosis not present

## 2015-10-01 DIAGNOSIS — E119 Type 2 diabetes mellitus without complications: Secondary | ICD-10-CM | POA: Diagnosis not present

## 2015-10-01 DIAGNOSIS — N2581 Secondary hyperparathyroidism of renal origin: Secondary | ICD-10-CM | POA: Diagnosis not present

## 2015-10-01 DIAGNOSIS — D509 Iron deficiency anemia, unspecified: Secondary | ICD-10-CM | POA: Diagnosis not present

## 2015-10-01 DIAGNOSIS — Z23 Encounter for immunization: Secondary | ICD-10-CM | POA: Diagnosis not present

## 2015-10-01 DIAGNOSIS — D631 Anemia in chronic kidney disease: Secondary | ICD-10-CM | POA: Diagnosis not present

## 2015-10-01 DIAGNOSIS — N186 End stage renal disease: Secondary | ICD-10-CM | POA: Diagnosis not present

## 2015-10-02 DIAGNOSIS — N186 End stage renal disease: Secondary | ICD-10-CM | POA: Diagnosis not present

## 2015-10-02 DIAGNOSIS — E1129 Type 2 diabetes mellitus with other diabetic kidney complication: Secondary | ICD-10-CM | POA: Diagnosis not present

## 2015-10-02 DIAGNOSIS — Z992 Dependence on renal dialysis: Secondary | ICD-10-CM | POA: Diagnosis not present

## 2015-10-04 DIAGNOSIS — D631 Anemia in chronic kidney disease: Secondary | ICD-10-CM | POA: Diagnosis not present

## 2015-10-04 DIAGNOSIS — N2581 Secondary hyperparathyroidism of renal origin: Secondary | ICD-10-CM | POA: Diagnosis not present

## 2015-10-04 DIAGNOSIS — N186 End stage renal disease: Secondary | ICD-10-CM | POA: Diagnosis not present

## 2015-10-06 DIAGNOSIS — D631 Anemia in chronic kidney disease: Secondary | ICD-10-CM | POA: Diagnosis not present

## 2015-10-06 DIAGNOSIS — N186 End stage renal disease: Secondary | ICD-10-CM | POA: Diagnosis not present

## 2015-10-06 DIAGNOSIS — N2581 Secondary hyperparathyroidism of renal origin: Secondary | ICD-10-CM | POA: Diagnosis not present

## 2015-10-08 DIAGNOSIS — N186 End stage renal disease: Secondary | ICD-10-CM | POA: Diagnosis not present

## 2015-10-08 DIAGNOSIS — D631 Anemia in chronic kidney disease: Secondary | ICD-10-CM | POA: Diagnosis not present

## 2015-10-08 DIAGNOSIS — N2581 Secondary hyperparathyroidism of renal origin: Secondary | ICD-10-CM | POA: Diagnosis not present

## 2015-10-11 DIAGNOSIS — D631 Anemia in chronic kidney disease: Secondary | ICD-10-CM | POA: Diagnosis not present

## 2015-10-11 DIAGNOSIS — N186 End stage renal disease: Secondary | ICD-10-CM | POA: Diagnosis not present

## 2015-10-11 DIAGNOSIS — N2581 Secondary hyperparathyroidism of renal origin: Secondary | ICD-10-CM | POA: Diagnosis not present

## 2015-10-13 DIAGNOSIS — N2581 Secondary hyperparathyroidism of renal origin: Secondary | ICD-10-CM | POA: Diagnosis not present

## 2015-10-13 DIAGNOSIS — N186 End stage renal disease: Secondary | ICD-10-CM | POA: Diagnosis not present

## 2015-10-13 DIAGNOSIS — D631 Anemia in chronic kidney disease: Secondary | ICD-10-CM | POA: Diagnosis not present

## 2015-10-18 DIAGNOSIS — N186 End stage renal disease: Secondary | ICD-10-CM | POA: Diagnosis not present

## 2015-10-18 DIAGNOSIS — D631 Anemia in chronic kidney disease: Secondary | ICD-10-CM | POA: Diagnosis not present

## 2015-10-18 DIAGNOSIS — N2581 Secondary hyperparathyroidism of renal origin: Secondary | ICD-10-CM | POA: Diagnosis not present

## 2015-10-20 DIAGNOSIS — N2581 Secondary hyperparathyroidism of renal origin: Secondary | ICD-10-CM | POA: Diagnosis not present

## 2015-10-20 DIAGNOSIS — D631 Anemia in chronic kidney disease: Secondary | ICD-10-CM | POA: Diagnosis not present

## 2015-10-20 DIAGNOSIS — N186 End stage renal disease: Secondary | ICD-10-CM | POA: Diagnosis not present

## 2015-10-22 DIAGNOSIS — N2581 Secondary hyperparathyroidism of renal origin: Secondary | ICD-10-CM | POA: Diagnosis not present

## 2015-10-22 DIAGNOSIS — N186 End stage renal disease: Secondary | ICD-10-CM | POA: Diagnosis not present

## 2015-10-22 DIAGNOSIS — D631 Anemia in chronic kidney disease: Secondary | ICD-10-CM | POA: Diagnosis not present

## 2015-10-25 DIAGNOSIS — D631 Anemia in chronic kidney disease: Secondary | ICD-10-CM | POA: Diagnosis not present

## 2015-10-25 DIAGNOSIS — N2581 Secondary hyperparathyroidism of renal origin: Secondary | ICD-10-CM | POA: Diagnosis not present

## 2015-10-25 DIAGNOSIS — N186 End stage renal disease: Secondary | ICD-10-CM | POA: Diagnosis not present

## 2015-10-27 DIAGNOSIS — N2581 Secondary hyperparathyroidism of renal origin: Secondary | ICD-10-CM | POA: Diagnosis not present

## 2015-10-27 DIAGNOSIS — N186 End stage renal disease: Secondary | ICD-10-CM | POA: Diagnosis not present

## 2015-10-27 DIAGNOSIS — D631 Anemia in chronic kidney disease: Secondary | ICD-10-CM | POA: Diagnosis not present

## 2015-10-29 DIAGNOSIS — N186 End stage renal disease: Secondary | ICD-10-CM | POA: Diagnosis not present

## 2015-10-29 DIAGNOSIS — D631 Anemia in chronic kidney disease: Secondary | ICD-10-CM | POA: Diagnosis not present

## 2015-10-29 DIAGNOSIS — N2581 Secondary hyperparathyroidism of renal origin: Secondary | ICD-10-CM | POA: Diagnosis not present

## 2015-11-01 DIAGNOSIS — N186 End stage renal disease: Secondary | ICD-10-CM | POA: Diagnosis not present

## 2015-11-01 DIAGNOSIS — N2581 Secondary hyperparathyroidism of renal origin: Secondary | ICD-10-CM | POA: Diagnosis not present

## 2015-11-01 DIAGNOSIS — D631 Anemia in chronic kidney disease: Secondary | ICD-10-CM | POA: Diagnosis not present

## 2015-11-02 DIAGNOSIS — Z992 Dependence on renal dialysis: Secondary | ICD-10-CM | POA: Diagnosis not present

## 2015-11-02 DIAGNOSIS — E1129 Type 2 diabetes mellitus with other diabetic kidney complication: Secondary | ICD-10-CM | POA: Diagnosis not present

## 2015-11-02 DIAGNOSIS — N186 End stage renal disease: Secondary | ICD-10-CM | POA: Diagnosis not present

## 2015-11-03 DIAGNOSIS — N2581 Secondary hyperparathyroidism of renal origin: Secondary | ICD-10-CM | POA: Diagnosis not present

## 2015-11-03 DIAGNOSIS — E119 Type 2 diabetes mellitus without complications: Secondary | ICD-10-CM | POA: Diagnosis not present

## 2015-11-03 DIAGNOSIS — D509 Iron deficiency anemia, unspecified: Secondary | ICD-10-CM | POA: Diagnosis not present

## 2015-11-03 DIAGNOSIS — D631 Anemia in chronic kidney disease: Secondary | ICD-10-CM | POA: Diagnosis not present

## 2015-11-03 DIAGNOSIS — N186 End stage renal disease: Secondary | ICD-10-CM | POA: Diagnosis not present

## 2015-11-05 DIAGNOSIS — D631 Anemia in chronic kidney disease: Secondary | ICD-10-CM | POA: Diagnosis not present

## 2015-11-05 DIAGNOSIS — N186 End stage renal disease: Secondary | ICD-10-CM | POA: Diagnosis not present

## 2015-11-05 DIAGNOSIS — D509 Iron deficiency anemia, unspecified: Secondary | ICD-10-CM | POA: Diagnosis not present

## 2015-11-05 DIAGNOSIS — E119 Type 2 diabetes mellitus without complications: Secondary | ICD-10-CM | POA: Diagnosis not present

## 2015-11-05 DIAGNOSIS — N2581 Secondary hyperparathyroidism of renal origin: Secondary | ICD-10-CM | POA: Diagnosis not present

## 2015-11-08 DIAGNOSIS — E119 Type 2 diabetes mellitus without complications: Secondary | ICD-10-CM | POA: Diagnosis not present

## 2015-11-08 DIAGNOSIS — N186 End stage renal disease: Secondary | ICD-10-CM | POA: Diagnosis not present

## 2015-11-08 DIAGNOSIS — D509 Iron deficiency anemia, unspecified: Secondary | ICD-10-CM | POA: Diagnosis not present

## 2015-11-08 DIAGNOSIS — D631 Anemia in chronic kidney disease: Secondary | ICD-10-CM | POA: Diagnosis not present

## 2015-11-08 DIAGNOSIS — N2581 Secondary hyperparathyroidism of renal origin: Secondary | ICD-10-CM | POA: Diagnosis not present

## 2015-11-10 ENCOUNTER — Other Ambulatory Visit: Payer: Self-pay | Admitting: *Deleted

## 2015-11-10 ENCOUNTER — Encounter: Payer: Self-pay | Admitting: Family

## 2015-11-10 DIAGNOSIS — D631 Anemia in chronic kidney disease: Secondary | ICD-10-CM | POA: Diagnosis not present

## 2015-11-10 DIAGNOSIS — I739 Peripheral vascular disease, unspecified: Secondary | ICD-10-CM

## 2015-11-10 DIAGNOSIS — D509 Iron deficiency anemia, unspecified: Secondary | ICD-10-CM | POA: Diagnosis not present

## 2015-11-10 DIAGNOSIS — N186 End stage renal disease: Secondary | ICD-10-CM | POA: Diagnosis not present

## 2015-11-10 DIAGNOSIS — E119 Type 2 diabetes mellitus without complications: Secondary | ICD-10-CM | POA: Diagnosis not present

## 2015-11-10 DIAGNOSIS — I6523 Occlusion and stenosis of bilateral carotid arteries: Secondary | ICD-10-CM

## 2015-11-10 DIAGNOSIS — N2581 Secondary hyperparathyroidism of renal origin: Secondary | ICD-10-CM | POA: Diagnosis not present

## 2015-11-11 ENCOUNTER — Ambulatory Visit (HOSPITAL_COMMUNITY)
Admission: RE | Admit: 2015-11-11 | Discharge: 2015-11-11 | Disposition: A | Discharge status: Home / Self Care | Payer: Medicare Other | Source: Ambulatory Visit | Attending: Family | Admitting: Family

## 2015-11-11 ENCOUNTER — Ambulatory Visit (INDEPENDENT_AMBULATORY_CARE_PROVIDER_SITE_OTHER): Payer: Medicare Other | Admitting: Family

## 2015-11-11 ENCOUNTER — Ambulatory Visit (INDEPENDENT_AMBULATORY_CARE_PROVIDER_SITE_OTHER)
Admission: RE | Admit: 2015-11-11 | Discharge: 2015-11-11 | Disposition: A | Discharge status: Home / Self Care | Payer: Medicare Other | Source: Ambulatory Visit | Attending: Family | Admitting: Family

## 2015-11-11 ENCOUNTER — Encounter: Payer: Self-pay | Admitting: Family

## 2015-11-11 VITALS — BP 137/68 | HR 63 | Temp 97.3°F | Ht 62.5 in | Wt 152.0 lb

## 2015-11-11 DIAGNOSIS — I6523 Occlusion and stenosis of bilateral carotid arteries: Secondary | ICD-10-CM

## 2015-11-11 DIAGNOSIS — Z720 Tobacco use: Secondary | ICD-10-CM | POA: Diagnosis not present

## 2015-11-11 DIAGNOSIS — R938 Abnormal findings on diagnostic imaging of other specified body structures: Secondary | ICD-10-CM | POA: Diagnosis not present

## 2015-11-11 DIAGNOSIS — I739 Peripheral vascular disease, unspecified: Secondary | ICD-10-CM

## 2015-11-11 DIAGNOSIS — I779 Disorder of arteries and arterioles, unspecified: Secondary | ICD-10-CM

## 2015-11-11 DIAGNOSIS — F17200 Nicotine dependence, unspecified, uncomplicated: Secondary | ICD-10-CM | POA: Diagnosis not present

## 2015-11-11 LAB — VAS US CAROTID
LCCAPDIAS: 15 cm/s
LCCAPSYS: 72 cm/s
LICAPSYS: -81 cm/s
Left CCA dist dias: 15 cm/s
Left CCA dist sys: 63 cm/s
Left ICA dist dias: -12 cm/s
Left ICA dist sys: -40 cm/s
Left ICA prox dias: -19 cm/s
RCCADSYS: -54 cm/s
RCCAPDIAS: 14 cm/s
RIGHT CCA MID DIAS: 10 cm/s
Right CCA prox sys: 83 cm/s

## 2015-11-11 NOTE — Progress Notes (Signed)
[facility] [place]   CC: Follow up peripheral artery occlusive disease  History of Present Illness [name] is a 70 y.o. female patient of Dr. Bridgett Larsson who is s/p arteriogram with bilateral run off on 12/31/14 . The patient's lower extremity symptoms are unchanged: intermittent claudication. The patient is able to complete their activities of daily living. The patient denies rest pain at this point.  After walking about 60 feet, both calves start hurting, relieved by rest.  AV graft HD access is in her left upper arm, MWF are HD days.  She denies non healing wounds in her LE's.  She reports a stroke, MI, and seizures about 2005, started HD at that time.  The patient denies New Medical or Surgical History.  Pt Diabetic: No Pt smoker: smoker  (1/2 ppd, started at age 47 yrs)  Pt meds include: Statin :Yes Betablocker: Yes ASA: Yes Other anticoagulants/antiplatelets: no   Past Medical History:  Diagnosis Date  . Arthritis of shoulder    Bilateral  . CAD (coronary artery disease)    stent to RCA  . Cancer ([place])    clear cell cancer, kidney  . Chronic renal insufficiency    On hemodialysis  . Complication of anesthesia 12/2010   pt is very confused, with AMS with anesthesia  . COPD (chronic obstructive pulmonary disease) ([facility]) t  . CVA 07/27/2008   CVA affected cognition and memory per family, no focal deficits.    . CVA (cerebral infarction) 2003   no apparent residual  . Depression   . Diastolic congestive heart failure ([place])   . Dyslipidemia   . Encephalopathy   . ESRD 07/27/2008   ESRD due to HTN and NSAID's, started hemodialysis in 2005 in [place], [place]. Went to [facility] from 2010 to 2012 and since 2012 has been getting dialysis at [facility] on [address] in [place] on a MWF schedule. First access with RUA AVG placed in [place]. Next and current access was LUA AVG placed by Dr. Lucky Cowboy in [place] in or around 2012. Has had  2 or 3 procedures on that graft since placed per family. She gets her access work done here in [place] now. She is allergic to heparin and does not get any heparin at dialysis; she had an allergic reaction apparently when in ICU in the past.      . Hyperlipidemia   . Hypertension   . Hypothyroidism   . Positive PPD    completed rifampin  . PVD (peripheral vascular disease) ([place])   . Seizures [facility])     Social History Social History  Substance Use Topics  . Smoking status: Current Every Day Smoker    Packs/day: 0.50    Years: 53.00    Types: Cigarettes  . Smokeless tobacco: Never Used     Comment: down to .25ppd  . Alcohol use 0.0 oz/week     Comment: very rarely per pt    Family History Family History  Problem Relation Age of Onset  . Heart disease Father   . Hypertension Mother   . Dementia Mother   . Coronary artery disease Sister   . Heart attack Sister   . Hypertension Brother     Past Surgical History:  Procedure Laterality Date  . ABDOMINAL HYSTERECTOMY    . CARDIAC CATHETERIZATION    . CATARACT EXTRACTION Bilateral   . CHOLECYSTECTOMY    . D&Cs    . LEFT HEART CATHETERIZATION WITH CORONARY ANGIOGRAM N/A 02/22/2011  Procedure: LEFT HEART CATHETERIZATION WITH CORONARY ANGIOGRAM;  Surgeon: Wellington Hampshire, MD;  Location: [place] CATH LAB;  Service: Cardiovascular;  Laterality: N/A;  . left nephrectomy    . NEPHRECTOMY Left    Malignant tumor  . PERIPHERAL VASCULAR CATHETERIZATION N/A 12/31/2014   Procedure: Abdominal Aortogram;  Surgeon: Conrad Succasunna, MD;  Location: [place] CV LAB;  Service: Cardiovascular;  Laterality: N/A;  . right ankle repair    . RIGHT HEART CATHETERIZATION N/A 01/29/2014   Procedure: RIGHT HEART CATH;  Surgeon: Larey Dresser, MD;  Location: [facility] CATH LAB;  Service: Cardiovascular;  Laterality: N/A;  . TONSILLECTOMY    . VASCULAR SURGERY  11/2010   graft inserted to left arm    Allergies  Allergen Reactions  . Ace Inhibitors  Anaphylaxis and Rash  . Heparin Other (See Comments)    MDs told her not to take after reaction in ICU  . Iohexol Swelling and Other (See Comments)    1970s; passed out and had facial/tongue swelling.  Requires 13-hour prep with prednisone and benadryl  . Phenytoin Other (See Comments)    Had reaction while in ICU; doesn't know.  MDs told her not to take ever again.  [name] Phenytoin Sodium Extended Other (See Comments)  . Wellbutrin [Bupropion] Other (See Comments)    seizures  . Ampicillin Hives and Rash  . Meperidine Hcl Swelling and Rash    Makes tongue swell  . Morphine Rash  . Penicillins Rash    Has patient had a PCN reaction causing immediate rash, facial/tongue/throat swelling, SOB or lightheadedness with hypotension: Yes Has patient had a PCN reaction causing severe rash involving mucus membranes or skin necrosis: No Has patient had a PCN reaction that required hospitalization No Has patient had a PCN reaction occurring within the last 10 years: No If all of the above answers are "NO", then may proceed with Cephalosporin use.    [name] Pentazocine Rash  . Valproic Acid And Related Other (See Comments)    Confusion   . Iodinated Diagnostic Agents Other (See Comments)    Pre-meditate with benadryl and prednisone 3 times before appt.  . Pentazocine Lactate Other (See Comments)    Patient does not remember reaction to this med ([place]).     Current Outpatient Prescriptions  Medication Sig Dispense Refill  . acetaminophen (TYLENOL) 500 MG tablet Take 1,000 mg by mouth daily as needed for mild pain.     [name] albuterol (PROVENTIL HFA;VENTOLIN HFA) 108 (90 Base) MCG/ACT inhaler Inhale 1-2 puffs into the lungs every 6 (six) hours as needed for wheezing or shortness of breath. 1 Inhaler 5  . ambrisentan (LETAIRIS) 5 MG tablet TAKE 1 TABLET (5 MG) ORALLY DAILY. 30 tablet 5  . amLODipine (NORVASC) 10 MG tablet Take 10 mg by mouth at bedtime.     [name] aspirin EC 81 MG tablet Take 81 mg by mouth  daily.    . carvedilol (COREG) 25 MG tablet Take 25 mg by mouth 2 (two) times daily with a meal.    . diphenhydrAMINE (BENADRYL) 50 MG capsule Take 50 mg by mouth on 12/24/14, at 6:30 am. (Patient taking differently: Take 50 mg by mouth every Monday, Wednesday, and Friday. On dialysis days) 1 capsule 0  . hydrALAZINE (APRESOLINE) 50 MG tablet Take 50 mg by mouth 3 (three) times daily.    . hydrOXYzine (ATARAX/VISTARIL) 25 MG tablet Take 25 mg by mouth daily as needed for itching. Reported on 12/16/2014  6  .  IOPHEN C-NR 100-10 MG/5ML syrup     . isosorbide mononitrate (IMDUR) 30 MG 24 hr tablet Take 1 tablet (30 mg total) by mouth daily. PLEASE  CONTACT OFFICE FOR ADDITIONAL REFILLS 15 tablet 0  . levETIRAcetam (KEPPRA) 500 MG tablet Take 500 mg by mouth daily at 8 pm.    . levothyroxine (SYNTHROID, LEVOTHROID) 88 MCG tablet Take 88 mcg by mouth daily before breakfast.    . multivitamin (RENA-VIT) TABS tablet Take 1 tablet by mouth daily.    . nitroGLYCERIN (NITROSTAT) 0.4 MG SL tablet Place 1 tablet (0.4 mg total) under the tongue every 5 (five) minutes as needed for chest pain. 30 tablet 0  . omeprazole (PRILOSEC OTC) 20 MG tablet Take 20 mg by mouth daily.    . pravastatin (PRAVACHOL) 40 MG tablet Take 40 mg by mouth at bedtime.     . SENSIPAR 90 MG tablet Take 90 mg by mouth at bedtime.     . sevelamer carbonate (RENVELA) 800 MG tablet Take 1,600-3,200 mg by mouth 3 (three) times daily. 4 tabs TID, 2 tabs with each snack    . traMADol (ULTRAM) 50 MG tablet Take 50 mg by mouth daily as needed for moderate pain.     . traZODone (DESYREL) 50 MG tablet Take 50 mg by mouth at bedtime as needed for sleep.      No current facility-administered medications for this visit.     ROS: See HPI for pertinent positives and negatives.   Physical Examination  Vitals:   11/11/15 1049  BP: 137/68  Pulse: 63  Temp: 97.3 F (36.3 C)  SpO2: 100%  Weight: 152 lb (68.9 kg)  Height: 5' 2.5" (1.588 m)    Body mass index is 27.36 kg/m.  General: A&O x 3, WDWN  Pulmonary: Sym exp, respirations are non labored, fair air movt except diminished in both bases, no rhonchi, rales, or wheezing. Occasional moist cough.  Cardiac: RRR, Nl S1, S2, + murmur  Vascular: Vessel Right Left  Radial Palpable Not Palpable  Brachial Palpable Palpable  Carotid Palpable, without bruit Palpable, with bruit  Aorta Not palpable N/A  Femoral Palpable Palpable  Popliteal Not palpable Not palpable  PT Not Palpable Not Palpable  DP Not Palpable Not Palpable   Gastrointestinal: soft, NTND, no G/R, no HSM, no palpable masses, no CVAT B, mid-line incision healed  Musculoskeletal: M/S 5/5 throughout , Extremities without ischemic changes , R medial thigh incision healed, LUA AVG palpable with thrill and bruit  Neurologic: CN 2-12 grossly intact, Pain and light touch intact in extremities except decreased in feet, Motor exam     ASSESSMENT: [name] is a 70 y.o. female who presents with: a history of bilateral LE stents [place] about 2002 in another city. Bilateral SFA are occluded with extensive collateralization via the PFA according to angiogram in January 2017. She did not want bypass surgery in her legs; she will try maximal medical management.  She has moderate claudication in both calves that is not life limiting.  She has no signs of ischemia in her feet/legs.  She had a CVA in 2005.  DATA Carotid duplex today suggests irregular calcific plaque in the bilateral ICA and ECA, <40%bilateral ICA stenosis. Bilateral vertebral artery flow is antegrade. Bilateral subclavian artery waveforms are normal. No previous carotid duplex exams for comparison.  ABI's today indicate moderate bilateral arterial occlusive disease, monophasic waveforms throughout. Right ABI is 0.70, left is 0.76. Right TBI is 0.31 (0.33, 07/15/15) with  toe pressure of  44; left TBI is 0.32 (0.46) with toe pressure of 46   Dr. Bridgett Larsson last saw pt on 01/29/15. At that time the angiogram demonstrated bilateral occluded stents with occluded SFA with extensive collateralization via the PFA. She has brisk refilling of her AK popliteal arteries.  Based on her angiographic findings, this patient needs: no immediate intervention thus she might be a candidate for B fem-AK pop bypasses.  She does NOT want bypasses at this point and is making an attempt at maximal medical mgmt.  She knows she needs to quit smoking.   The patient is currently on a statin: Pravachol. The patient is currently on an anti-platelet: ASA.    PLAN:  She waklks about 25 minutes daily and does not like to walk. Graduated walking program discussed and how to achieve if she decides to increase her walking.  Daily seated leg exercises discussed and demonstrated.   The patient was counseled re smoking cessation and given several free resources re smoking cessation.  Based on the patient's vascular studies and examination, pt will return to clinic in 6 months with ABI's, carotid duplex in a year.   I discussed in depth with the patient the nature of atherosclerosis, and emphasized the importance of maximal medical management including strict control of blood pressure, blood glucose, and lipid levels, obtaining regular exercise, and cessation of smoking.  The patient is aware that without maximal medical management the underlying atherosclerotic disease process will progress, limiting the benefit of any interventions.  The patient was given information about PAD including signs, symptoms, treatment, what symptoms should prompt the patient to seek immediate medical care, and risk reduction measures to take.  Clemon Chambers, RN, MSN, FNP-C [facility] Phone: [phone]  Clinic MD: [facility]  11/11/15 11:12 AM

## 2015-11-11 NOTE — Patient Instructions (Addendum)
Peripheral Vascular Disease Peripheral vascular disease (PVD) is a disease of the blood vessels that are not part of your heart and brain. A simple term for PVD is poor circulation. In most cases, PVD narrows the blood vessels that carry blood from your heart to the rest of your body. This can result in a decreased supply of blood to your arms, legs, and internal organs, like your stomach or kidneys. However, it most often affects a person's lower legs and feet. There are two types of PVD.  Organic PVD. This is the more common type. It is caused by damage to the structure of blood vessels.  Functional PVD. This is caused by conditions that make blood vessels contract and tighten (spasm). Without treatment, PVD tends to get worse over time. PVD can also lead to acute ischemic limb. This is when an arm or limb suddenly has trouble getting enough blood. This is a medical emergency. CAUSES Each type of PVD has many different causes. The most common cause of PVD is buildup of a fatty material (plaque) inside of your arteries (atherosclerosis). Small amounts of plaque can break off from the walls of the blood vessels and become lodged in a smaller artery. This blocks blood flow and can cause acute ischemic limb. Other common causes of PVD include:  Blood clots that form inside of blood vessels.  Injuries to blood vessels.  Diseases that cause inflammation of blood vessels or cause blood vessel spasms.  Health behaviors and health history that increase your risk of developing PVD. RISK FACTORS  You may have a greater risk of PVD if you:  Have a family history of PVD.  Have certain medical conditions, including:  High cholesterol.  Diabetes.  High blood pressure (hypertension).  Coronary heart disease.  Past problems with blood clots.  Past injury, such as burns or a broken bone. These may have damaged blood vessels in your limbs.  Buerger disease. This is caused by inflamed blood  vessels in your hands and feet.  Some forms of arthritis.  Rare birth defects that affect the arteries in your legs.  Use tobacco.  Do not get enough exercise.  Are obese.  Are age 50 or older. SIGNS AND SYMPTOMS  PVD may cause many different symptoms. Your symptoms depend on what part of your body is not getting enough blood. Some common signs and symptoms include:  Cramps in your lower legs. This may be a symptom of poor leg circulation (claudication).  Pain and weakness in your legs while you are physically active that goes away when you rest (intermittent claudication).  Leg pain when at rest.  Leg numbness, tingling, or weakness.  Coldness in a leg or foot, especially when compared with the other leg.  Skin or hair changes. These can include:  Hair loss.  Shiny skin.  Pale or bluish skin.  Thick toenails.  Inability to get or maintain an erection (erectile dysfunction). People with PVD are more prone to developing ulcers and sores on their toes, feet, or legs. These may take longer than normal to heal. DIAGNOSIS Your health care provider may diagnose PVD from your signs and symptoms. The health care provider will also do a physical exam. You may have tests to find out what is causing your PVD and determine its severity. Tests may include:  Blood pressure recordings from your arms and legs and measurements of the strength of your pulses (pulse volume recordings).  Imaging studies using sound waves to take pictures of   the blood flow through your blood vessels (Doppler ultrasound).  Injecting a dye into your blood vessels before having imaging studies using:  X-rays (angiogram or arteriogram).  Computer-generated X-rays (CT angiogram).  A powerful electromagnetic field and a computer (magnetic resonance angiogram or MRA). TREATMENT Treatment for PVD depends on the cause of your condition and the severity of your symptoms. It also depends on your age. Underlying  causes need to be treated and controlled. These include long-lasting (chronic) conditions, such as diabetes, high cholesterol, and high blood pressure. You may need to first try making lifestyle changes and taking medicines. Surgery may be needed if these do not work. Lifestyle changes may include:  Quitting smoking.  Exercising regularly.  Following a low-fat, low-cholesterol diet. Medicines may include:  Blood thinners to prevent blood clots.  Medicines to improve blood flow.  Medicines to improve your blood cholesterol levels. Surgical procedures may include:  A procedure that uses an inflated balloon to open a blocked artery and improve blood flow (angioplasty).  A procedure to put in a tube (stent) to keep a blocked artery open (stent implant).  Surgery to reroute blood flow around a blocked artery (peripheral bypass surgery).  Surgery to remove dead tissue from an infected wound on the affected limb.  Amputation. This is surgical removal of the affected limb. This may be necessary in cases of acute ischemic limb that are not improved through medical or surgical treatments. HOME CARE INSTRUCTIONS  Take medicines only as directed by your health care provider.  Do not use any tobacco products, including cigarettes, chewing tobacco, or electronic cigarettes. If you need help quitting, ask your health care provider.  Lose weight if you are overweight, and maintain a healthy weight as directed by your health care provider.  Eat a diet that is low in fat and cholesterol. If you need help, ask your health care provider.  Exercise regularly. Ask your health care provider to suggest some good activities for you.  Use compression stockings or other mechanical devices as directed by your health care provider.  Take good care of your feet.  Wear comfortable shoes that fit well.  Check your feet often for any cuts or sores. SEEK MEDICAL CARE IF:  You have cramps in your legs  while walking.  You have leg pain when you are at rest.  You have coldness in a leg or foot.  Your skin changes.  You have erectile dysfunction.  You have cuts or sores on your feet that are not healing. SEEK IMMEDIATE MEDICAL CARE IF:  Your arm or leg turns cold and blue.  Your arms or legs become red, warm, swollen, painful, or numb.  You have chest pain or trouble breathing.  You suddenly have weakness in your face, arm, or leg.  You become very confused or lose the ability to speak.  You suddenly have a very bad headache or lose your vision.   This information is not intended to replace advice given to you by your health care provider. Make sure you discuss any questions you have with your health care provider.   Document Released: 01/27/2004 Document Revised: 01/09/2014 Document Reviewed: 05/29/2013 Elsevier Interactive Patient Education 2016 Elsevier Inc.     Steps to Quit Smoking  Smoking tobacco can be harmful to your health and can affect almost every organ in your body. Smoking puts you, and those around you, at risk for developing many serious chronic diseases. Quitting smoking is difficult, but it is one of   the best things that you can do for your health. It is never too late to quit. WHAT ARE THE BENEFITS OF QUITTING SMOKING? When you quit smoking, you lower your risk of developing serious diseases and conditions, such as:  Lung cancer or lung disease, such as COPD.  Heart disease.  Stroke.  Heart attack.  Infertility.  Osteoporosis and bone fractures. Additionally, symptoms such as coughing, wheezing, and shortness of breath may get better when you quit. You may also find that you get sick less often because your body is stronger at fighting off colds and infections. If you are pregnant, quitting smoking can help to reduce your chances of having a baby of low birth weight. HOW DO I GET READY TO QUIT? When you decide to quit smoking, create a plan to  make sure that you are successful. Before you quit:  Pick a date to quit. Set a date within the next two weeks to give you time to prepare.  Write down the reasons why you are quitting. Keep this list in places where you will see it often, such as on your bathroom mirror or in your car or wallet.  Identify the people, places, things, and activities that make you want to smoke (triggers) and avoid them. Make sure to take these actions:  Throw away all cigarettes at home, at work, and in your car.  Throw away smoking accessories, such as ashtrays and lighters.  Clean your car and make sure to empty the ashtray.  Clean your home, including curtains and carpets.  Tell your family, friends, and coworkers that you are quitting. Support from your loved ones can make quitting easier.  Talk with your health care provider about your options for quitting smoking.  Find out what treatment options are covered by your health insurance. WHAT STRATEGIES CAN I USE TO QUIT SMOKING?  Talk with your healthcare provider about different strategies to quit smoking. Some strategies include:  Quitting smoking altogether instead of gradually lessening how much you smoke over a period of time. Research shows that quitting "cold turkey" is more successful than gradually quitting.  Attending in-person counseling to help you build problem-solving skills. You are more likely to have success in quitting if you attend several counseling sessions. Even short sessions of 10 minutes can be effective.  Finding resources and support systems that can help you to quit smoking and remain smoke-free after you quit. These resources are most helpful when you use them often. They can include:  Online chats with a counselor.  Telephone quitlines.  Printed self-help materials.  Support groups or group counseling.  Text messaging programs.  Mobile phone applications.  Taking medicines to help you quit smoking. (If you are  pregnant or breastfeeding, talk with your health care provider first.) Some medicines contain nicotine and some do not. Both types of medicines help with cravings, but the medicines that include nicotine help to relieve withdrawal symptoms. Your health care provider may recommend:  Nicotine patches, gum, or lozenges.  Nicotine inhalers or sprays.  Non-nicotine medicine that is taken by mouth. Talk with your health care provider about combining strategies, such as taking medicines while you are also receiving in-person counseling. Using these two strategies together makes you more likely to succeed in quitting than if you used either strategy on its own. If you are pregnant or breastfeeding, talk with your health care provider about finding counseling or other support strategies to quit smoking. Do not take medicine to help you   quit smoking unless told to do so by your health care provider. WHAT THINGS CAN I DO TO MAKE IT EASIER TO QUIT? Quitting smoking might feel overwhelming at first, but there is a lot that you can do to make it easier. Take these important actions:  Reach out to your family and friends and ask that they support and encourage you during this time. Call telephone quitlines, reach out to support groups, or work with a counselor for support.  Ask people who smoke to avoid smoking around you.  Avoid places that trigger you to smoke, such as bars, parties, or smoke-break areas at work.  Spend time around people who do not smoke.  Lessen stress in your life, because stress can be a smoking trigger for some people. To lessen stress, try:  Exercising regularly.  Deep-breathing exercises.  Yoga.  Meditating.  Performing a body scan. This involves closing your eyes, scanning your body from head to toe, and noticing which parts of your body are particularly tense. Purposefully relax the muscles in those areas.  Download or purchase mobile phone or tablet apps (applications)  that can help you stick to your quit plan by providing reminders, tips, and encouragement. There are many free apps, such as QuitGuide from the CDC (Centers for Disease Control and Prevention). You can find other support for quitting smoking (smoking cessation) through smokefree.gov and other websites. HOW WILL I FEEL WHEN I QUIT SMOKING? Within the first 24 hours of quitting smoking, you may start to feel some withdrawal symptoms. These symptoms are usually most noticeable 2-3 days after quitting, but they usually do not last beyond 2-3 weeks. Changes or symptoms that you might experience include:  Mood swings.  Restlessness, anxiety, or irritation.  Difficulty concentrating.  Dizziness.  Strong cravings for sugary foods in addition to nicotine.  Mild weight gain.  Constipation.  Nausea.  Coughing or a sore throat.  Changes in how your medicines work in your body.  A depressed mood.  Difficulty sleeping (insomnia). After the first 2-3 weeks of quitting, you may start to notice more positive results, such as:  Improved sense of smell and taste.  Decreased coughing and sore throat.  Slower heart rate.  Lower blood pressure.  Clearer skin.  The ability to breathe more easily.  Fewer sick days. Quitting smoking is very challenging for most people. Do not get discouraged if you are not successful the first time. Some people need to make many attempts to quit before they achieve long-term success. Do your best to stick to your quit plan, and talk with your health care provider if you have any questions or concerns.   This information is not intended to replace advice given to you by your health care provider. Make sure you discuss any questions you have with your health care provider.   Document Released: 12/13/2000 Document Revised: 05/05/2014 Document Reviewed: 05/05/2014 Elsevier Interactive Patient Education 2016 Elsevier Inc.  

## 2015-11-12 DIAGNOSIS — N2581 Secondary hyperparathyroidism of renal origin: Secondary | ICD-10-CM | POA: Diagnosis not present

## 2015-11-12 DIAGNOSIS — D509 Iron deficiency anemia, unspecified: Secondary | ICD-10-CM | POA: Diagnosis not present

## 2015-11-12 DIAGNOSIS — D631 Anemia in chronic kidney disease: Secondary | ICD-10-CM | POA: Diagnosis not present

## 2015-11-12 DIAGNOSIS — N186 End stage renal disease: Secondary | ICD-10-CM | POA: Diagnosis not present

## 2015-11-12 DIAGNOSIS — E119 Type 2 diabetes mellitus without complications: Secondary | ICD-10-CM | POA: Diagnosis not present

## 2015-11-15 DIAGNOSIS — N2581 Secondary hyperparathyroidism of renal origin: Secondary | ICD-10-CM | POA: Diagnosis not present

## 2015-11-15 DIAGNOSIS — D509 Iron deficiency anemia, unspecified: Secondary | ICD-10-CM | POA: Diagnosis not present

## 2015-11-15 DIAGNOSIS — N186 End stage renal disease: Secondary | ICD-10-CM | POA: Diagnosis not present

## 2015-11-15 DIAGNOSIS — D631 Anemia in chronic kidney disease: Secondary | ICD-10-CM | POA: Diagnosis not present

## 2015-11-15 DIAGNOSIS — E119 Type 2 diabetes mellitus without complications: Secondary | ICD-10-CM | POA: Diagnosis not present

## 2015-11-15 NOTE — Addendum Note (Signed)
Addended by: Thresa Ross C on: 11/15/2015 04:09 PM   Modules accepted: Orders

## 2015-11-17 DIAGNOSIS — E119 Type 2 diabetes mellitus without complications: Secondary | ICD-10-CM | POA: Diagnosis not present

## 2015-11-17 DIAGNOSIS — N2581 Secondary hyperparathyroidism of renal origin: Secondary | ICD-10-CM | POA: Diagnosis not present

## 2015-11-17 DIAGNOSIS — D509 Iron deficiency anemia, unspecified: Secondary | ICD-10-CM | POA: Diagnosis not present

## 2015-11-17 DIAGNOSIS — N186 End stage renal disease: Secondary | ICD-10-CM | POA: Diagnosis not present

## 2015-11-17 DIAGNOSIS — D631 Anemia in chronic kidney disease: Secondary | ICD-10-CM | POA: Diagnosis not present

## 2015-11-19 DIAGNOSIS — N2581 Secondary hyperparathyroidism of renal origin: Secondary | ICD-10-CM | POA: Diagnosis not present

## 2015-11-19 DIAGNOSIS — D631 Anemia in chronic kidney disease: Secondary | ICD-10-CM | POA: Diagnosis not present

## 2015-11-19 DIAGNOSIS — D509 Iron deficiency anemia, unspecified: Secondary | ICD-10-CM | POA: Diagnosis not present

## 2015-11-19 DIAGNOSIS — N186 End stage renal disease: Secondary | ICD-10-CM | POA: Diagnosis not present

## 2015-11-19 DIAGNOSIS — E119 Type 2 diabetes mellitus without complications: Secondary | ICD-10-CM | POA: Diagnosis not present

## 2015-11-21 DIAGNOSIS — N2581 Secondary hyperparathyroidism of renal origin: Secondary | ICD-10-CM | POA: Diagnosis not present

## 2015-11-21 DIAGNOSIS — D631 Anemia in chronic kidney disease: Secondary | ICD-10-CM | POA: Diagnosis not present

## 2015-11-21 DIAGNOSIS — N186 End stage renal disease: Secondary | ICD-10-CM | POA: Diagnosis not present

## 2015-11-21 DIAGNOSIS — D509 Iron deficiency anemia, unspecified: Secondary | ICD-10-CM | POA: Diagnosis not present

## 2015-11-21 DIAGNOSIS — E119 Type 2 diabetes mellitus without complications: Secondary | ICD-10-CM | POA: Diagnosis not present

## 2015-11-22 DIAGNOSIS — I871 Compression of vein: Secondary | ICD-10-CM | POA: Diagnosis not present

## 2015-11-22 DIAGNOSIS — T82858A Stenosis of vascular prosthetic devices, implants and grafts, initial encounter: Secondary | ICD-10-CM | POA: Diagnosis not present

## 2015-11-22 DIAGNOSIS — Z992 Dependence on renal dialysis: Secondary | ICD-10-CM | POA: Diagnosis not present

## 2015-11-22 DIAGNOSIS — N186 End stage renal disease: Secondary | ICD-10-CM | POA: Diagnosis not present

## 2015-11-23 DIAGNOSIS — D509 Iron deficiency anemia, unspecified: Secondary | ICD-10-CM | POA: Diagnosis not present

## 2015-11-23 DIAGNOSIS — E119 Type 2 diabetes mellitus without complications: Secondary | ICD-10-CM | POA: Diagnosis not present

## 2015-11-23 DIAGNOSIS — N2581 Secondary hyperparathyroidism of renal origin: Secondary | ICD-10-CM | POA: Diagnosis not present

## 2015-11-23 DIAGNOSIS — N186 End stage renal disease: Secondary | ICD-10-CM | POA: Diagnosis not present

## 2015-11-23 DIAGNOSIS — D631 Anemia in chronic kidney disease: Secondary | ICD-10-CM | POA: Diagnosis not present

## 2015-11-24 ENCOUNTER — Other Ambulatory Visit: Payer: Self-pay

## 2015-11-24 MED ORDER — AMBRISENTAN 5 MG PO TABS: ORAL_TABLET | ORAL | 5 refills | Status: DC

## 2015-11-26 DIAGNOSIS — Z992 Dependence on renal dialysis: Secondary | ICD-10-CM | POA: Diagnosis not present

## 2015-11-26 DIAGNOSIS — N2581 Secondary hyperparathyroidism of renal origin: Secondary | ICD-10-CM | POA: Diagnosis not present

## 2015-11-26 DIAGNOSIS — N186 End stage renal disease: Secondary | ICD-10-CM | POA: Diagnosis not present

## 2015-11-29 DIAGNOSIS — D631 Anemia in chronic kidney disease: Secondary | ICD-10-CM | POA: Diagnosis not present

## 2015-11-29 DIAGNOSIS — N2581 Secondary hyperparathyroidism of renal origin: Secondary | ICD-10-CM | POA: Diagnosis not present

## 2015-11-29 DIAGNOSIS — D509 Iron deficiency anemia, unspecified: Secondary | ICD-10-CM | POA: Diagnosis not present

## 2015-11-29 DIAGNOSIS — E119 Type 2 diabetes mellitus without complications: Secondary | ICD-10-CM | POA: Diagnosis not present

## 2015-11-29 DIAGNOSIS — N186 End stage renal disease: Secondary | ICD-10-CM | POA: Diagnosis not present

## 2015-12-01 DIAGNOSIS — E119 Type 2 diabetes mellitus without complications: Secondary | ICD-10-CM | POA: Diagnosis not present

## 2015-12-01 DIAGNOSIS — D509 Iron deficiency anemia, unspecified: Secondary | ICD-10-CM | POA: Diagnosis not present

## 2015-12-01 DIAGNOSIS — N2581 Secondary hyperparathyroidism of renal origin: Secondary | ICD-10-CM | POA: Diagnosis not present

## 2015-12-01 DIAGNOSIS — D631 Anemia in chronic kidney disease: Secondary | ICD-10-CM | POA: Diagnosis not present

## 2015-12-01 DIAGNOSIS — N186 End stage renal disease: Secondary | ICD-10-CM | POA: Diagnosis not present

## 2015-12-02 ENCOUNTER — Ambulatory Visit: Payer: Medicare Other | Admitting: Pulmonary Disease

## 2015-12-02 DIAGNOSIS — L602 Onychogryphosis: Secondary | ICD-10-CM | POA: Diagnosis not present

## 2015-12-02 DIAGNOSIS — N186 End stage renal disease: Secondary | ICD-10-CM | POA: Diagnosis not present

## 2015-12-02 DIAGNOSIS — I739 Peripheral vascular disease, unspecified: Secondary | ICD-10-CM | POA: Diagnosis not present

## 2015-12-02 DIAGNOSIS — Z992 Dependence on renal dialysis: Secondary | ICD-10-CM | POA: Diagnosis not present

## 2015-12-02 DIAGNOSIS — L84 Corns and callosities: Secondary | ICD-10-CM | POA: Diagnosis not present

## 2015-12-02 DIAGNOSIS — E1129 Type 2 diabetes mellitus with other diabetic kidney complication: Secondary | ICD-10-CM | POA: Diagnosis not present

## 2015-12-03 DIAGNOSIS — N2581 Secondary hyperparathyroidism of renal origin: Secondary | ICD-10-CM | POA: Diagnosis not present

## 2015-12-03 DIAGNOSIS — E119 Type 2 diabetes mellitus without complications: Secondary | ICD-10-CM | POA: Diagnosis not present

## 2015-12-03 DIAGNOSIS — D631 Anemia in chronic kidney disease: Secondary | ICD-10-CM | POA: Diagnosis not present

## 2015-12-03 DIAGNOSIS — N186 End stage renal disease: Secondary | ICD-10-CM | POA: Diagnosis not present

## 2015-12-03 DIAGNOSIS — D509 Iron deficiency anemia, unspecified: Secondary | ICD-10-CM | POA: Diagnosis not present

## 2015-12-06 DIAGNOSIS — D631 Anemia in chronic kidney disease: Secondary | ICD-10-CM | POA: Diagnosis not present

## 2015-12-06 DIAGNOSIS — D509 Iron deficiency anemia, unspecified: Secondary | ICD-10-CM | POA: Diagnosis not present

## 2015-12-06 DIAGNOSIS — N186 End stage renal disease: Secondary | ICD-10-CM | POA: Diagnosis not present

## 2015-12-06 DIAGNOSIS — N2581 Secondary hyperparathyroidism of renal origin: Secondary | ICD-10-CM | POA: Diagnosis not present

## 2015-12-06 DIAGNOSIS — E119 Type 2 diabetes mellitus without complications: Secondary | ICD-10-CM | POA: Diagnosis not present

## 2015-12-08 DIAGNOSIS — D509 Iron deficiency anemia, unspecified: Secondary | ICD-10-CM | POA: Diagnosis not present

## 2015-12-08 DIAGNOSIS — N2581 Secondary hyperparathyroidism of renal origin: Secondary | ICD-10-CM | POA: Diagnosis not present

## 2015-12-08 DIAGNOSIS — N186 End stage renal disease: Secondary | ICD-10-CM | POA: Diagnosis not present

## 2015-12-08 DIAGNOSIS — D631 Anemia in chronic kidney disease: Secondary | ICD-10-CM | POA: Diagnosis not present

## 2015-12-08 DIAGNOSIS — E119 Type 2 diabetes mellitus without complications: Secondary | ICD-10-CM | POA: Diagnosis not present

## 2015-12-09 ENCOUNTER — Ambulatory Visit: Payer: Medicare Other | Admitting: Pulmonary Disease

## 2015-12-10 DIAGNOSIS — D631 Anemia in chronic kidney disease: Secondary | ICD-10-CM | POA: Diagnosis not present

## 2015-12-10 DIAGNOSIS — E119 Type 2 diabetes mellitus without complications: Secondary | ICD-10-CM | POA: Diagnosis not present

## 2015-12-10 DIAGNOSIS — N186 End stage renal disease: Secondary | ICD-10-CM | POA: Diagnosis not present

## 2015-12-10 DIAGNOSIS — D509 Iron deficiency anemia, unspecified: Secondary | ICD-10-CM | POA: Diagnosis not present

## 2015-12-10 DIAGNOSIS — N2581 Secondary hyperparathyroidism of renal origin: Secondary | ICD-10-CM | POA: Diagnosis not present

## 2015-12-13 DIAGNOSIS — N186 End stage renal disease: Secondary | ICD-10-CM | POA: Diagnosis not present

## 2015-12-13 DIAGNOSIS — D631 Anemia in chronic kidney disease: Secondary | ICD-10-CM | POA: Diagnosis not present

## 2015-12-13 DIAGNOSIS — E119 Type 2 diabetes mellitus without complications: Secondary | ICD-10-CM | POA: Diagnosis not present

## 2015-12-13 DIAGNOSIS — D509 Iron deficiency anemia, unspecified: Secondary | ICD-10-CM | POA: Diagnosis not present

## 2015-12-13 DIAGNOSIS — N2581 Secondary hyperparathyroidism of renal origin: Secondary | ICD-10-CM | POA: Diagnosis not present

## 2015-12-15 DIAGNOSIS — N2581 Secondary hyperparathyroidism of renal origin: Secondary | ICD-10-CM | POA: Diagnosis not present

## 2015-12-15 DIAGNOSIS — D631 Anemia in chronic kidney disease: Secondary | ICD-10-CM | POA: Diagnosis not present

## 2015-12-15 DIAGNOSIS — E119 Type 2 diabetes mellitus without complications: Secondary | ICD-10-CM | POA: Diagnosis not present

## 2015-12-15 DIAGNOSIS — N186 End stage renal disease: Secondary | ICD-10-CM | POA: Diagnosis not present

## 2015-12-15 DIAGNOSIS — D509 Iron deficiency anemia, unspecified: Secondary | ICD-10-CM | POA: Diagnosis not present

## 2015-12-16 DIAGNOSIS — Z992 Dependence on renal dialysis: Secondary | ICD-10-CM | POA: Diagnosis not present

## 2015-12-16 DIAGNOSIS — H6123 Impacted cerumen, bilateral: Secondary | ICD-10-CM | POA: Diagnosis not present

## 2015-12-16 DIAGNOSIS — I130 Hypertensive heart and chronic kidney disease with heart failure and stage 1 through stage 4 chronic kidney disease, or unspecified chronic kidney disease: Secondary | ICD-10-CM | POA: Diagnosis not present

## 2015-12-16 DIAGNOSIS — I132 Hypertensive heart and chronic kidney disease with heart failure and with stage 5 chronic kidney disease, or end stage renal disease: Secondary | ICD-10-CM | POA: Diagnosis not present

## 2015-12-16 DIAGNOSIS — N186 End stage renal disease: Secondary | ICD-10-CM | POA: Diagnosis not present

## 2015-12-16 DIAGNOSIS — I5032 Chronic diastolic (congestive) heart failure: Secondary | ICD-10-CM | POA: Diagnosis not present

## 2015-12-16 DIAGNOSIS — E039 Hypothyroidism, unspecified: Secondary | ICD-10-CM | POA: Diagnosis not present

## 2015-12-16 DIAGNOSIS — Z79899 Other long term (current) drug therapy: Secondary | ICD-10-CM | POA: Diagnosis not present

## 2015-12-16 DIAGNOSIS — Z0000 Encounter for general adult medical examination without abnormal findings: Secondary | ICD-10-CM | POA: Diagnosis not present

## 2015-12-17 DIAGNOSIS — D509 Iron deficiency anemia, unspecified: Secondary | ICD-10-CM | POA: Diagnosis not present

## 2015-12-17 DIAGNOSIS — D631 Anemia in chronic kidney disease: Secondary | ICD-10-CM | POA: Diagnosis not present

## 2015-12-17 DIAGNOSIS — N186 End stage renal disease: Secondary | ICD-10-CM | POA: Diagnosis not present

## 2015-12-17 DIAGNOSIS — E119 Type 2 diabetes mellitus without complications: Secondary | ICD-10-CM | POA: Diagnosis not present

## 2015-12-17 DIAGNOSIS — N2581 Secondary hyperparathyroidism of renal origin: Secondary | ICD-10-CM | POA: Diagnosis not present

## 2015-12-20 DIAGNOSIS — D631 Anemia in chronic kidney disease: Secondary | ICD-10-CM | POA: Diagnosis not present

## 2015-12-20 DIAGNOSIS — N186 End stage renal disease: Secondary | ICD-10-CM | POA: Diagnosis not present

## 2015-12-20 DIAGNOSIS — D509 Iron deficiency anemia, unspecified: Secondary | ICD-10-CM | POA: Diagnosis not present

## 2015-12-20 DIAGNOSIS — E119 Type 2 diabetes mellitus without complications: Secondary | ICD-10-CM | POA: Diagnosis not present

## 2015-12-20 DIAGNOSIS — N2581 Secondary hyperparathyroidism of renal origin: Secondary | ICD-10-CM | POA: Diagnosis not present

## 2015-12-22 DIAGNOSIS — E119 Type 2 diabetes mellitus without complications: Secondary | ICD-10-CM | POA: Diagnosis not present

## 2015-12-22 DIAGNOSIS — N2581 Secondary hyperparathyroidism of renal origin: Secondary | ICD-10-CM | POA: Diagnosis not present

## 2015-12-22 DIAGNOSIS — D509 Iron deficiency anemia, unspecified: Secondary | ICD-10-CM | POA: Diagnosis not present

## 2015-12-22 DIAGNOSIS — D631 Anemia in chronic kidney disease: Secondary | ICD-10-CM | POA: Diagnosis not present

## 2015-12-22 DIAGNOSIS — N186 End stage renal disease: Secondary | ICD-10-CM | POA: Diagnosis not present

## 2015-12-24 DIAGNOSIS — D631 Anemia in chronic kidney disease: Secondary | ICD-10-CM | POA: Diagnosis not present

## 2015-12-24 DIAGNOSIS — N186 End stage renal disease: Secondary | ICD-10-CM | POA: Diagnosis not present

## 2015-12-24 DIAGNOSIS — N2581 Secondary hyperparathyroidism of renal origin: Secondary | ICD-10-CM | POA: Diagnosis not present

## 2015-12-24 DIAGNOSIS — D509 Iron deficiency anemia, unspecified: Secondary | ICD-10-CM | POA: Diagnosis not present

## 2015-12-24 DIAGNOSIS — E119 Type 2 diabetes mellitus without complications: Secondary | ICD-10-CM | POA: Diagnosis not present

## 2015-12-26 DIAGNOSIS — D509 Iron deficiency anemia, unspecified: Secondary | ICD-10-CM | POA: Diagnosis not present

## 2015-12-26 DIAGNOSIS — N186 End stage renal disease: Secondary | ICD-10-CM | POA: Diagnosis not present

## 2015-12-26 DIAGNOSIS — E119 Type 2 diabetes mellitus without complications: Secondary | ICD-10-CM | POA: Diagnosis not present

## 2015-12-26 DIAGNOSIS — D631 Anemia in chronic kidney disease: Secondary | ICD-10-CM | POA: Diagnosis not present

## 2015-12-26 DIAGNOSIS — N2581 Secondary hyperparathyroidism of renal origin: Secondary | ICD-10-CM | POA: Diagnosis not present

## 2015-12-29 DIAGNOSIS — N186 End stage renal disease: Secondary | ICD-10-CM | POA: Diagnosis not present

## 2015-12-29 DIAGNOSIS — D509 Iron deficiency anemia, unspecified: Secondary | ICD-10-CM | POA: Diagnosis not present

## 2015-12-29 DIAGNOSIS — E119 Type 2 diabetes mellitus without complications: Secondary | ICD-10-CM | POA: Diagnosis not present

## 2015-12-29 DIAGNOSIS — D631 Anemia in chronic kidney disease: Secondary | ICD-10-CM | POA: Diagnosis not present

## 2015-12-29 DIAGNOSIS — N2581 Secondary hyperparathyroidism of renal origin: Secondary | ICD-10-CM | POA: Diagnosis not present

## 2015-12-31 DIAGNOSIS — N186 End stage renal disease: Secondary | ICD-10-CM | POA: Diagnosis not present

## 2015-12-31 DIAGNOSIS — E119 Type 2 diabetes mellitus without complications: Secondary | ICD-10-CM | POA: Diagnosis not present

## 2015-12-31 DIAGNOSIS — D509 Iron deficiency anemia, unspecified: Secondary | ICD-10-CM | POA: Diagnosis not present

## 2015-12-31 DIAGNOSIS — N2581 Secondary hyperparathyroidism of renal origin: Secondary | ICD-10-CM | POA: Diagnosis not present

## 2015-12-31 DIAGNOSIS — D631 Anemia in chronic kidney disease: Secondary | ICD-10-CM | POA: Diagnosis not present

## 2016-01-02 DIAGNOSIS — E1129 Type 2 diabetes mellitus with other diabetic kidney complication: Secondary | ICD-10-CM | POA: Diagnosis not present

## 2016-01-02 DIAGNOSIS — N2581 Secondary hyperparathyroidism of renal origin: Secondary | ICD-10-CM | POA: Diagnosis not present

## 2016-01-02 DIAGNOSIS — E119 Type 2 diabetes mellitus without complications: Secondary | ICD-10-CM | POA: Diagnosis not present

## 2016-01-02 DIAGNOSIS — Z992 Dependence on renal dialysis: Secondary | ICD-10-CM | POA: Diagnosis not present

## 2016-01-02 DIAGNOSIS — D509 Iron deficiency anemia, unspecified: Secondary | ICD-10-CM | POA: Diagnosis not present

## 2016-01-02 DIAGNOSIS — D631 Anemia in chronic kidney disease: Secondary | ICD-10-CM | POA: Diagnosis not present

## 2016-01-02 DIAGNOSIS — N186 End stage renal disease: Secondary | ICD-10-CM | POA: Diagnosis not present

## 2016-01-05 DIAGNOSIS — D509 Iron deficiency anemia, unspecified: Secondary | ICD-10-CM | POA: Diagnosis not present

## 2016-01-05 DIAGNOSIS — E119 Type 2 diabetes mellitus without complications: Secondary | ICD-10-CM | POA: Diagnosis not present

## 2016-01-05 DIAGNOSIS — D631 Anemia in chronic kidney disease: Secondary | ICD-10-CM | POA: Diagnosis not present

## 2016-01-05 DIAGNOSIS — N2581 Secondary hyperparathyroidism of renal origin: Secondary | ICD-10-CM | POA: Diagnosis not present

## 2016-01-05 DIAGNOSIS — N186 End stage renal disease: Secondary | ICD-10-CM | POA: Diagnosis not present

## 2016-01-07 DIAGNOSIS — D509 Iron deficiency anemia, unspecified: Secondary | ICD-10-CM | POA: Diagnosis not present

## 2016-01-07 DIAGNOSIS — N2581 Secondary hyperparathyroidism of renal origin: Secondary | ICD-10-CM | POA: Diagnosis not present

## 2016-01-07 DIAGNOSIS — E119 Type 2 diabetes mellitus without complications: Secondary | ICD-10-CM | POA: Diagnosis not present

## 2016-01-07 DIAGNOSIS — N186 End stage renal disease: Secondary | ICD-10-CM | POA: Diagnosis not present

## 2016-01-07 DIAGNOSIS — D631 Anemia in chronic kidney disease: Secondary | ICD-10-CM | POA: Diagnosis not present

## 2016-01-10 DIAGNOSIS — D509 Iron deficiency anemia, unspecified: Secondary | ICD-10-CM | POA: Diagnosis not present

## 2016-01-10 DIAGNOSIS — D631 Anemia in chronic kidney disease: Secondary | ICD-10-CM | POA: Diagnosis not present

## 2016-01-10 DIAGNOSIS — N186 End stage renal disease: Secondary | ICD-10-CM | POA: Diagnosis not present

## 2016-01-10 DIAGNOSIS — N2581 Secondary hyperparathyroidism of renal origin: Secondary | ICD-10-CM | POA: Diagnosis not present

## 2016-01-10 DIAGNOSIS — E119 Type 2 diabetes mellitus without complications: Secondary | ICD-10-CM | POA: Diagnosis not present

## 2016-01-12 DIAGNOSIS — N2581 Secondary hyperparathyroidism of renal origin: Secondary | ICD-10-CM | POA: Diagnosis not present

## 2016-01-12 DIAGNOSIS — N186 End stage renal disease: Secondary | ICD-10-CM | POA: Diagnosis not present

## 2016-01-12 DIAGNOSIS — D509 Iron deficiency anemia, unspecified: Secondary | ICD-10-CM | POA: Diagnosis not present

## 2016-01-12 DIAGNOSIS — D631 Anemia in chronic kidney disease: Secondary | ICD-10-CM | POA: Diagnosis not present

## 2016-01-12 DIAGNOSIS — E119 Type 2 diabetes mellitus without complications: Secondary | ICD-10-CM | POA: Diagnosis not present

## 2016-01-14 DIAGNOSIS — N186 End stage renal disease: Secondary | ICD-10-CM | POA: Diagnosis not present

## 2016-01-14 DIAGNOSIS — N2581 Secondary hyperparathyroidism of renal origin: Secondary | ICD-10-CM | POA: Diagnosis not present

## 2016-01-14 DIAGNOSIS — D509 Iron deficiency anemia, unspecified: Secondary | ICD-10-CM | POA: Diagnosis not present

## 2016-01-14 DIAGNOSIS — D631 Anemia in chronic kidney disease: Secondary | ICD-10-CM | POA: Diagnosis not present

## 2016-01-14 DIAGNOSIS — E119 Type 2 diabetes mellitus without complications: Secondary | ICD-10-CM | POA: Diagnosis not present

## 2016-01-17 DIAGNOSIS — N2581 Secondary hyperparathyroidism of renal origin: Secondary | ICD-10-CM | POA: Diagnosis not present

## 2016-01-17 DIAGNOSIS — N186 End stage renal disease: Secondary | ICD-10-CM | POA: Diagnosis not present

## 2016-01-17 DIAGNOSIS — D509 Iron deficiency anemia, unspecified: Secondary | ICD-10-CM | POA: Diagnosis not present

## 2016-01-17 DIAGNOSIS — D631 Anemia in chronic kidney disease: Secondary | ICD-10-CM | POA: Diagnosis not present

## 2016-01-17 DIAGNOSIS — E119 Type 2 diabetes mellitus without complications: Secondary | ICD-10-CM | POA: Diagnosis not present

## 2016-01-19 DIAGNOSIS — D631 Anemia in chronic kidney disease: Secondary | ICD-10-CM | POA: Diagnosis not present

## 2016-01-19 DIAGNOSIS — E119 Type 2 diabetes mellitus without complications: Secondary | ICD-10-CM | POA: Diagnosis not present

## 2016-01-19 DIAGNOSIS — N186 End stage renal disease: Secondary | ICD-10-CM | POA: Diagnosis not present

## 2016-01-19 DIAGNOSIS — D509 Iron deficiency anemia, unspecified: Secondary | ICD-10-CM | POA: Diagnosis not present

## 2016-01-19 DIAGNOSIS — N2581 Secondary hyperparathyroidism of renal origin: Secondary | ICD-10-CM | POA: Diagnosis not present

## 2016-01-21 DIAGNOSIS — E119 Type 2 diabetes mellitus without complications: Secondary | ICD-10-CM | POA: Diagnosis not present

## 2016-01-21 DIAGNOSIS — D509 Iron deficiency anemia, unspecified: Secondary | ICD-10-CM | POA: Diagnosis not present

## 2016-01-21 DIAGNOSIS — D631 Anemia in chronic kidney disease: Secondary | ICD-10-CM | POA: Diagnosis not present

## 2016-01-21 DIAGNOSIS — N186 End stage renal disease: Secondary | ICD-10-CM | POA: Diagnosis not present

## 2016-01-21 DIAGNOSIS — N2581 Secondary hyperparathyroidism of renal origin: Secondary | ICD-10-CM | POA: Diagnosis not present

## 2016-01-24 DIAGNOSIS — N2581 Secondary hyperparathyroidism of renal origin: Secondary | ICD-10-CM | POA: Diagnosis not present

## 2016-01-24 DIAGNOSIS — D631 Anemia in chronic kidney disease: Secondary | ICD-10-CM | POA: Diagnosis not present

## 2016-01-24 DIAGNOSIS — E119 Type 2 diabetes mellitus without complications: Secondary | ICD-10-CM | POA: Diagnosis not present

## 2016-01-24 DIAGNOSIS — D509 Iron deficiency anemia, unspecified: Secondary | ICD-10-CM | POA: Diagnosis not present

## 2016-01-24 DIAGNOSIS — N186 End stage renal disease: Secondary | ICD-10-CM | POA: Diagnosis not present

## 2016-01-26 DIAGNOSIS — D631 Anemia in chronic kidney disease: Secondary | ICD-10-CM | POA: Diagnosis not present

## 2016-01-26 DIAGNOSIS — N186 End stage renal disease: Secondary | ICD-10-CM | POA: Diagnosis not present

## 2016-01-26 DIAGNOSIS — D509 Iron deficiency anemia, unspecified: Secondary | ICD-10-CM | POA: Diagnosis not present

## 2016-01-26 DIAGNOSIS — E119 Type 2 diabetes mellitus without complications: Secondary | ICD-10-CM | POA: Diagnosis not present

## 2016-01-26 DIAGNOSIS — N2581 Secondary hyperparathyroidism of renal origin: Secondary | ICD-10-CM | POA: Diagnosis not present

## 2016-01-28 DIAGNOSIS — E119 Type 2 diabetes mellitus without complications: Secondary | ICD-10-CM | POA: Diagnosis not present

## 2016-01-28 DIAGNOSIS — N2581 Secondary hyperparathyroidism of renal origin: Secondary | ICD-10-CM | POA: Diagnosis not present

## 2016-01-28 DIAGNOSIS — D631 Anemia in chronic kidney disease: Secondary | ICD-10-CM | POA: Diagnosis not present

## 2016-01-28 DIAGNOSIS — N186 End stage renal disease: Secondary | ICD-10-CM | POA: Diagnosis not present

## 2016-01-28 DIAGNOSIS — D509 Iron deficiency anemia, unspecified: Secondary | ICD-10-CM | POA: Diagnosis not present

## 2016-02-01 DIAGNOSIS — E119 Type 2 diabetes mellitus without complications: Secondary | ICD-10-CM | POA: Diagnosis not present

## 2016-02-01 DIAGNOSIS — N186 End stage renal disease: Secondary | ICD-10-CM | POA: Diagnosis not present

## 2016-02-01 DIAGNOSIS — D631 Anemia in chronic kidney disease: Secondary | ICD-10-CM | POA: Diagnosis not present

## 2016-02-01 DIAGNOSIS — D509 Iron deficiency anemia, unspecified: Secondary | ICD-10-CM | POA: Diagnosis not present

## 2016-02-01 DIAGNOSIS — N2581 Secondary hyperparathyroidism of renal origin: Secondary | ICD-10-CM | POA: Diagnosis not present

## 2016-02-02 DIAGNOSIS — D509 Iron deficiency anemia, unspecified: Secondary | ICD-10-CM | POA: Diagnosis not present

## 2016-02-02 DIAGNOSIS — N186 End stage renal disease: Secondary | ICD-10-CM | POA: Diagnosis not present

## 2016-02-02 DIAGNOSIS — N2581 Secondary hyperparathyroidism of renal origin: Secondary | ICD-10-CM | POA: Diagnosis not present

## 2016-02-02 DIAGNOSIS — Z992 Dependence on renal dialysis: Secondary | ICD-10-CM | POA: Diagnosis not present

## 2016-02-02 DIAGNOSIS — D631 Anemia in chronic kidney disease: Secondary | ICD-10-CM | POA: Diagnosis not present

## 2016-02-02 DIAGNOSIS — E1129 Type 2 diabetes mellitus with other diabetic kidney complication: Secondary | ICD-10-CM | POA: Diagnosis not present

## 2016-02-02 DIAGNOSIS — E119 Type 2 diabetes mellitus without complications: Secondary | ICD-10-CM | POA: Diagnosis not present

## 2016-02-03 ENCOUNTER — Other Ambulatory Visit (INDEPENDENT_AMBULATORY_CARE_PROVIDER_SITE_OTHER): Payer: Medicare Other

## 2016-02-03 ENCOUNTER — Encounter (INDEPENDENT_AMBULATORY_CARE_PROVIDER_SITE_OTHER): Payer: Self-pay

## 2016-02-03 ENCOUNTER — Ambulatory Visit (INDEPENDENT_AMBULATORY_CARE_PROVIDER_SITE_OTHER): Payer: Medicare Other | Admitting: Pulmonary Disease

## 2016-02-03 ENCOUNTER — Encounter: Payer: Self-pay | Admitting: Pulmonary Disease

## 2016-02-03 VITALS — BP 114/66 | HR 70 | Ht 62.5 in | Wt 161.0 lb

## 2016-02-03 DIAGNOSIS — I272 Pulmonary hypertension, unspecified: Secondary | ICD-10-CM

## 2016-02-03 DIAGNOSIS — Z5181 Encounter for therapeutic drug level monitoring: Secondary | ICD-10-CM

## 2016-02-03 DIAGNOSIS — Z87891 Personal history of nicotine dependence: Secondary | ICD-10-CM | POA: Diagnosis not present

## 2016-02-03 LAB — HEPATIC FUNCTION PANEL
ALBUMIN: 4.1 g/dL (ref 3.5–5.2)
ALK PHOS: 74 U/L (ref 39–117)
ALT: 58 U/L — ABNORMAL HIGH (ref 0–35)
AST: 18 U/L (ref 0–37)
BILIRUBIN DIRECT: 0.1 mg/dL (ref 0.0–0.3)
TOTAL PROTEIN: 6.9 g/dL (ref 6.0–8.3)
Total Bilirubin: 0.4 mg/dL (ref 0.2–1.2)

## 2016-02-03 NOTE — Assessment & Plan Note (Signed)
She quit smoking amazingly in December after 54 years. I'm very happy for an congratulated her today. We are going to refer her to the lung cancer screening program given her smoking history and age.

## 2016-02-03 NOTE — Assessment & Plan Note (Signed)
Her echocardiogram grossly overestimates her actual PA pressure numbers. She has been hesitant to change any of her medications in the past and given the fact that she stasis stable I see no indication for escalating the medicine.  Plan: Check liver function testing to evaluate for drug toxicity Continue Letairis 6 minute walk next visit in 6 months

## 2016-02-03 NOTE — Patient Instructions (Signed)
We will call you with the results of today's blood work Keep taking Milda Smart as you're doing We will refer you to the lung cancer screening program We will see you back in 4-6 months or sooner if needed

## 2016-02-03 NOTE — Progress Notes (Signed)
Subjective:    Patient ID: [name], female    DOB: 26-Oct-1945, 71 y.o.   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  PFTs normal.  Active smoker. Secondary Pulmonary hypertension, ESRD, CHF 04/08/2013 full pulmonary function test> ratio 77%, FEV1 1.44 L (80% predicted), total lung capacity 3.55 (72% predicted), ERV 0.23 L (129% predicted), DLCO 10.95 (47% predicted) 05/2013 Echo> LVEF 35-40% (diffuse global hypokinesis), RV severely dilated, RVSP 157mHg, RA dilated 05/2013 6MW> 240 meters, O2 saturaiton 99% exercise 05/2013 CT chest > no ILD, definite heart failure and CAD 1/26 RHC > RA mean 1 RV 52/1 PA 52/20, mean 30 PCWP mean 6  Oxygen saturations: PA 70% AO 95%  Cardiac Output (Fick) 5.77  Cardiac Index (Fick) 3.32 PVR 4.2 WU  Cardiac Output (Thermo) 5.12 Cardiac Index (Thermo) 2.94 PVR 4.7 WU 03/2014 ANA neg, HIV neg, SCL-70 neg, RNP neg, B2 glycoprotein neg, DS DNA neg 05/2014 6MW> 342m (98% RA)  HPI  Chief Complaint  Patient presents with  . Follow-up    pt doing well, no complaints.  pt quit smoking 12/2015   [name] quit smoking in 12/2015.  She says she feels really wonderful right now.  She says that she has not been coughing since she quit smoking.  She is getting around [facility] without difficulty.  She says that she is tolerating dialsysis without difficulty.  She currently has no desire to stop smoking right. She was out of Letairis for a while. Otherwise she is doing really well.  No chest pain, no cough, no wheezing, no leg swelling, no weight loss.    Past Medical History:  Diagnosis Date  . Arthritis of shoulder    Bilateral  . CAD (coronary artery disease)    stent to RCA  . Cancer ([place])    clear cell cancer, kidney  . Chronic renal insufficiency    On hemodialysis  . Complication of anesthesia 12/2010   pt is very confused, with AMS with anesthesia  . COPD (chronic obstructive pulmonary disease) ([facility]) t  . CVA  07/27/2008   CVA affected cognition and memory per family, no focal deficits.    . CVA (cerebral infarction) 2003   no apparent residual  . Depression   . Diastolic congestive heart failure ([place])   . Dyslipidemia   . Encephalopathy   . ESRD 07/27/2008   ESRD due to HTN and NSAID's, started hemodialysis in 2005 in [place], [place]. Went to [facility] from 2010 to 2012 and since 2012 has been getting dialysis at [facility] on [address] in [place] on a MWF schedule. First access with RUA AVG placed in [place]. Next and current access was LUA AVG placed by Dr. Lucky Cowboy in [place] in or around 2012. Has had 2 or 3 procedures on that graft since placed per family. She gets her access work done here in [place] now. She is allergic to heparin and does not get any heparin at dialysis; she had an allergic reaction apparently when in ICU in the past.      . Hyperlipidemia   . Hypertension   . Hypothyroidism   . Positive PPD    completed rifampin  . PVD (peripheral vascular disease) ([place])   . Seizures ([place])      Review of Systems  Constitutional: Negative for chills, fatigue and fever.  HENT: Negative for rhinorrhea, sinus pressure and sneezing.   Respiratory: Negative for cough, shortness of breath and wheezing.  Cardiovascular: Negative for chest pain, palpitations and leg swelling.       Objective:   Physical Exam  Vitals:   02/03/16 1516  BP: 114/66  BP Location: Right Arm  Cuff Size: Normal  Pulse: 70  SpO2: 98%  Weight: 161 lb (73 kg)  Height: 5' 2.5" (1.588 m)   RA  Gen: well appearing HENT: OP clear, TM's clear, neck supple PULM: CTA B, normal percussion CV: RRR, systolic murmur, trace edema GI: BS+, soft, nontender Derm: no cyanosis or rash Psyche: normal mood and affect   05/2013 6MW> 240 meters, O2 saturaiton 99% exercise 01/29/2014 RHC> RA mean 1 RV 52/1 PA 52/20, mean 30 PCWP mean 6  Oxygen saturations: PA 70% AO 95%  Cardiac Output (Fick)  5.77  Cardiac Index (Fick) 3.32 PVR 4.2 WU  Cardiac Output (Thermo) 5.12 Cardiac Index (Thermo) 2.94 PVR 4.7 WU  LFT's from 11/10/2014 normal  Records from her visit with a vascular and vein associate physicians were reviewed her she was assessed for peripheral arterial disease and they elected to continue monitoring.     Assessment & Plan:   TOBACCO USER She quit smoking amazingly in December after 55 years. I'm very happy for an congratulated her today. We are going to refer her to the lung cancer screening program given her smoking history and age.  Pulmonary hypertension Her echocardiogram grossly overestimates her actual PA pressure numbers. She has been hesitant to change any of her medications in the past and given the fact that she stasis stable I see no indication for escalating the medicine.  Plan: Check liver function testing to evaluate for drug toxicity Continue Letairis 6 minute walk next visit in 6 months   Updated Medication List Outpatient Encounter Prescriptions as of 02/03/2016  Medication Sig  . acetaminophen (TYLENOL) 500 MG tablet Take 1,000 mg by mouth daily as needed for mild pain.   [name] albuterol (PROVENTIL HFA;VENTOLIN HFA) 108 (90 Base) MCG/ACT inhaler Inhale 1-2 puffs into the lungs every 6 (six) hours as needed for wheezing or shortness of breath.  [name] ambrisentan (LETAIRIS) 5 MG tablet TAKE 1 TABLET (5 MG) ORALLY DAILY.  [name] amLODipine (NORVASC) 10 MG tablet Take 10 mg by mouth at bedtime.   [name] aspirin EC 81 MG tablet Take 81 mg by mouth daily.  . carvedilol (COREG) 25 MG tablet Take 25 mg by mouth 2 (two) times daily with a meal.  . diphenhydrAMINE (BENADRYL) 50 MG capsule Take 50 mg by mouth on 12/24/14, at 6:30 am. (Patient taking differently: Take 50 mg by mouth every Monday, Wednesday, and Friday. On dialysis days)  . hydrALAZINE (APRESOLINE) 50 MG tablet Take 50 mg by mouth 3 (three) times daily.  . hydrOXYzine (ATARAX/VISTARIL) 25 MG tablet Take 25  mg by mouth daily as needed for itching. Reported on 12/16/2014  . IOPHEN C-NR 100-10 MG/5ML syrup   . isosorbide mononitrate (IMDUR) 30 MG 24 hr tablet Take 1 tablet (30 mg total) by mouth daily. PLEASE  CONTACT OFFICE FOR ADDITIONAL REFILLS  . levETIRAcetam (KEPPRA) 500 MG tablet Take 500 mg by mouth daily at 8 pm.  . levothyroxine (SYNTHROID, LEVOTHROID) 88 MCG tablet Take 88 mcg by mouth daily before breakfast.  . multivitamin (RENA-VIT) TABS tablet Take 1 tablet by mouth daily.  . nitroGLYCERIN (NITROSTAT) 0.4 MG SL tablet Place 1 tablet (0.4 mg total) under the tongue every 5 (five) minutes as needed for chest pain.  [name] omeprazole (PRILOSEC OTC) 20 MG tablet Take 20  mg by mouth daily.  . pravastatin (PRAVACHOL) 40 MG tablet Take 40 mg by mouth at bedtime.   . SENSIPAR 90 MG tablet Take 90 mg by mouth at bedtime.   . sevelamer carbonate (RENVELA) 800 MG tablet Take 1,600-3,200 mg by mouth 3 (three) times daily. 4 tabs TID, 2 tabs with each snack  . traMADol (ULTRAM) 50 MG tablet Take 50 mg by mouth daily as needed for moderate pain.   . traZODone (DESYREL) 50 MG tablet Take 50 mg by mouth at bedtime as needed for sleep.    No facility-administered encounter medications on file as of 02/03/2016.

## 2016-02-04 DIAGNOSIS — N2581 Secondary hyperparathyroidism of renal origin: Secondary | ICD-10-CM | POA: Diagnosis not present

## 2016-02-04 DIAGNOSIS — D509 Iron deficiency anemia, unspecified: Secondary | ICD-10-CM | POA: Diagnosis not present

## 2016-02-04 DIAGNOSIS — E119 Type 2 diabetes mellitus without complications: Secondary | ICD-10-CM | POA: Diagnosis not present

## 2016-02-04 DIAGNOSIS — D631 Anemia in chronic kidney disease: Secondary | ICD-10-CM | POA: Diagnosis not present

## 2016-02-04 DIAGNOSIS — N186 End stage renal disease: Secondary | ICD-10-CM | POA: Diagnosis not present

## 2016-02-07 DIAGNOSIS — N186 End stage renal disease: Secondary | ICD-10-CM | POA: Diagnosis not present

## 2016-02-07 DIAGNOSIS — D509 Iron deficiency anemia, unspecified: Secondary | ICD-10-CM | POA: Diagnosis not present

## 2016-02-07 DIAGNOSIS — N2581 Secondary hyperparathyroidism of renal origin: Secondary | ICD-10-CM | POA: Diagnosis not present

## 2016-02-07 DIAGNOSIS — E119 Type 2 diabetes mellitus without complications: Secondary | ICD-10-CM | POA: Diagnosis not present

## 2016-02-07 DIAGNOSIS — D631 Anemia in chronic kidney disease: Secondary | ICD-10-CM | POA: Diagnosis not present

## 2016-02-09 DIAGNOSIS — D631 Anemia in chronic kidney disease: Secondary | ICD-10-CM | POA: Diagnosis not present

## 2016-02-09 DIAGNOSIS — N186 End stage renal disease: Secondary | ICD-10-CM | POA: Diagnosis not present

## 2016-02-09 DIAGNOSIS — E119 Type 2 diabetes mellitus without complications: Secondary | ICD-10-CM | POA: Diagnosis not present

## 2016-02-09 DIAGNOSIS — N2581 Secondary hyperparathyroidism of renal origin: Secondary | ICD-10-CM | POA: Diagnosis not present

## 2016-02-09 DIAGNOSIS — D509 Iron deficiency anemia, unspecified: Secondary | ICD-10-CM | POA: Diagnosis not present

## 2016-02-11 DIAGNOSIS — D509 Iron deficiency anemia, unspecified: Secondary | ICD-10-CM | POA: Diagnosis not present

## 2016-02-11 DIAGNOSIS — N186 End stage renal disease: Secondary | ICD-10-CM | POA: Diagnosis not present

## 2016-02-11 DIAGNOSIS — E119 Type 2 diabetes mellitus without complications: Secondary | ICD-10-CM | POA: Diagnosis not present

## 2016-02-11 DIAGNOSIS — D631 Anemia in chronic kidney disease: Secondary | ICD-10-CM | POA: Diagnosis not present

## 2016-02-11 DIAGNOSIS — N2581 Secondary hyperparathyroidism of renal origin: Secondary | ICD-10-CM | POA: Diagnosis not present

## 2016-02-14 DIAGNOSIS — D509 Iron deficiency anemia, unspecified: Secondary | ICD-10-CM | POA: Diagnosis not present

## 2016-02-14 DIAGNOSIS — N186 End stage renal disease: Secondary | ICD-10-CM | POA: Diagnosis not present

## 2016-02-14 DIAGNOSIS — E119 Type 2 diabetes mellitus without complications: Secondary | ICD-10-CM | POA: Diagnosis not present

## 2016-02-14 DIAGNOSIS — D631 Anemia in chronic kidney disease: Secondary | ICD-10-CM | POA: Diagnosis not present

## 2016-02-14 DIAGNOSIS — N2581 Secondary hyperparathyroidism of renal origin: Secondary | ICD-10-CM | POA: Diagnosis not present

## 2016-02-16 DIAGNOSIS — E119 Type 2 diabetes mellitus without complications: Secondary | ICD-10-CM | POA: Diagnosis not present

## 2016-02-16 DIAGNOSIS — D509 Iron deficiency anemia, unspecified: Secondary | ICD-10-CM | POA: Diagnosis not present

## 2016-02-16 DIAGNOSIS — D631 Anemia in chronic kidney disease: Secondary | ICD-10-CM | POA: Diagnosis not present

## 2016-02-16 DIAGNOSIS — N2581 Secondary hyperparathyroidism of renal origin: Secondary | ICD-10-CM | POA: Diagnosis not present

## 2016-02-16 DIAGNOSIS — N186 End stage renal disease: Secondary | ICD-10-CM | POA: Diagnosis not present

## 2016-02-18 DIAGNOSIS — D631 Anemia in chronic kidney disease: Secondary | ICD-10-CM | POA: Diagnosis not present

## 2016-02-18 DIAGNOSIS — N186 End stage renal disease: Secondary | ICD-10-CM | POA: Diagnosis not present

## 2016-02-18 DIAGNOSIS — D509 Iron deficiency anemia, unspecified: Secondary | ICD-10-CM | POA: Diagnosis not present

## 2016-02-18 DIAGNOSIS — E119 Type 2 diabetes mellitus without complications: Secondary | ICD-10-CM | POA: Diagnosis not present

## 2016-02-18 DIAGNOSIS — N2581 Secondary hyperparathyroidism of renal origin: Secondary | ICD-10-CM | POA: Diagnosis not present

## 2016-02-21 DIAGNOSIS — N2581 Secondary hyperparathyroidism of renal origin: Secondary | ICD-10-CM | POA: Diagnosis not present

## 2016-02-21 DIAGNOSIS — D631 Anemia in chronic kidney disease: Secondary | ICD-10-CM | POA: Diagnosis not present

## 2016-02-21 DIAGNOSIS — D509 Iron deficiency anemia, unspecified: Secondary | ICD-10-CM | POA: Diagnosis not present

## 2016-02-21 DIAGNOSIS — E119 Type 2 diabetes mellitus without complications: Secondary | ICD-10-CM | POA: Diagnosis not present

## 2016-02-21 DIAGNOSIS — N186 End stage renal disease: Secondary | ICD-10-CM | POA: Diagnosis not present

## 2016-02-22 ENCOUNTER — Telehealth: Payer: Self-pay | Admitting: Acute Care

## 2016-02-23 DIAGNOSIS — N186 End stage renal disease: Secondary | ICD-10-CM | POA: Diagnosis not present

## 2016-02-23 DIAGNOSIS — D631 Anemia in chronic kidney disease: Secondary | ICD-10-CM | POA: Diagnosis not present

## 2016-02-23 DIAGNOSIS — D509 Iron deficiency anemia, unspecified: Secondary | ICD-10-CM | POA: Diagnosis not present

## 2016-02-23 DIAGNOSIS — E119 Type 2 diabetes mellitus without complications: Secondary | ICD-10-CM | POA: Diagnosis not present

## 2016-02-23 DIAGNOSIS — N2581 Secondary hyperparathyroidism of renal origin: Secondary | ICD-10-CM | POA: Diagnosis not present

## 2016-02-24 NOTE — Telephone Encounter (Signed)
LMTC x `1 for pt 

## 2016-02-25 DIAGNOSIS — E119 Type 2 diabetes mellitus without complications: Secondary | ICD-10-CM | POA: Diagnosis not present

## 2016-02-25 DIAGNOSIS — N186 End stage renal disease: Secondary | ICD-10-CM | POA: Diagnosis not present

## 2016-02-25 DIAGNOSIS — D509 Iron deficiency anemia, unspecified: Secondary | ICD-10-CM | POA: Diagnosis not present

## 2016-02-25 DIAGNOSIS — N2581 Secondary hyperparathyroidism of renal origin: Secondary | ICD-10-CM | POA: Diagnosis not present

## 2016-02-25 DIAGNOSIS — D631 Anemia in chronic kidney disease: Secondary | ICD-10-CM | POA: Diagnosis not present

## 2016-02-25 NOTE — Telephone Encounter (Signed)
Will forward this to lung nodule pool.

## 2016-02-28 DIAGNOSIS — D509 Iron deficiency anemia, unspecified: Secondary | ICD-10-CM | POA: Diagnosis not present

## 2016-02-28 DIAGNOSIS — N186 End stage renal disease: Secondary | ICD-10-CM | POA: Diagnosis not present

## 2016-02-28 DIAGNOSIS — N2581 Secondary hyperparathyroidism of renal origin: Secondary | ICD-10-CM | POA: Diagnosis not present

## 2016-02-28 DIAGNOSIS — D631 Anemia in chronic kidney disease: Secondary | ICD-10-CM | POA: Diagnosis not present

## 2016-02-28 DIAGNOSIS — E119 Type 2 diabetes mellitus without complications: Secondary | ICD-10-CM | POA: Diagnosis not present

## 2016-02-29 NOTE — Telephone Encounter (Signed)
Per referral note 02/28/16 [name] spoke with pt and advised that she does not qualify for the lung screening program - Dr Lake Bells made aware Nothing further needed

## 2016-03-01 DIAGNOSIS — E1129 Type 2 diabetes mellitus with other diabetic kidney complication: Secondary | ICD-10-CM | POA: Diagnosis not present

## 2016-03-01 DIAGNOSIS — Z992 Dependence on renal dialysis: Secondary | ICD-10-CM | POA: Diagnosis not present

## 2016-03-01 DIAGNOSIS — D631 Anemia in chronic kidney disease: Secondary | ICD-10-CM | POA: Diagnosis not present

## 2016-03-01 DIAGNOSIS — N186 End stage renal disease: Secondary | ICD-10-CM | POA: Diagnosis not present

## 2016-03-01 DIAGNOSIS — D509 Iron deficiency anemia, unspecified: Secondary | ICD-10-CM | POA: Diagnosis not present

## 2016-03-01 DIAGNOSIS — N2581 Secondary hyperparathyroidism of renal origin: Secondary | ICD-10-CM | POA: Diagnosis not present

## 2016-03-01 DIAGNOSIS — E119 Type 2 diabetes mellitus without complications: Secondary | ICD-10-CM | POA: Diagnosis not present

## 2016-03-03 DIAGNOSIS — N2581 Secondary hyperparathyroidism of renal origin: Secondary | ICD-10-CM | POA: Diagnosis not present

## 2016-03-03 DIAGNOSIS — D509 Iron deficiency anemia, unspecified: Secondary | ICD-10-CM | POA: Diagnosis not present

## 2016-03-03 DIAGNOSIS — N186 End stage renal disease: Secondary | ICD-10-CM | POA: Diagnosis not present

## 2016-03-03 DIAGNOSIS — D631 Anemia in chronic kidney disease: Secondary | ICD-10-CM | POA: Diagnosis not present

## 2016-03-03 DIAGNOSIS — Z23 Encounter for immunization: Secondary | ICD-10-CM | POA: Diagnosis not present

## 2016-03-03 DIAGNOSIS — E119 Type 2 diabetes mellitus without complications: Secondary | ICD-10-CM | POA: Diagnosis not present

## 2016-03-06 DIAGNOSIS — D509 Iron deficiency anemia, unspecified: Secondary | ICD-10-CM | POA: Diagnosis not present

## 2016-03-06 DIAGNOSIS — N186 End stage renal disease: Secondary | ICD-10-CM | POA: Diagnosis not present

## 2016-03-06 DIAGNOSIS — E119 Type 2 diabetes mellitus without complications: Secondary | ICD-10-CM | POA: Diagnosis not present

## 2016-03-06 DIAGNOSIS — N2581 Secondary hyperparathyroidism of renal origin: Secondary | ICD-10-CM | POA: Diagnosis not present

## 2016-03-06 DIAGNOSIS — D631 Anemia in chronic kidney disease: Secondary | ICD-10-CM | POA: Diagnosis not present

## 2016-03-06 DIAGNOSIS — Z23 Encounter for immunization: Secondary | ICD-10-CM | POA: Diagnosis not present

## 2016-03-08 DIAGNOSIS — E119 Type 2 diabetes mellitus without complications: Secondary | ICD-10-CM | POA: Diagnosis not present

## 2016-03-08 DIAGNOSIS — Z23 Encounter for immunization: Secondary | ICD-10-CM | POA: Diagnosis not present

## 2016-03-08 DIAGNOSIS — D509 Iron deficiency anemia, unspecified: Secondary | ICD-10-CM | POA: Diagnosis not present

## 2016-03-08 DIAGNOSIS — N186 End stage renal disease: Secondary | ICD-10-CM | POA: Diagnosis not present

## 2016-03-08 DIAGNOSIS — D631 Anemia in chronic kidney disease: Secondary | ICD-10-CM | POA: Diagnosis not present

## 2016-03-08 DIAGNOSIS — N2581 Secondary hyperparathyroidism of renal origin: Secondary | ICD-10-CM | POA: Diagnosis not present

## 2016-03-10 DIAGNOSIS — D631 Anemia in chronic kidney disease: Secondary | ICD-10-CM | POA: Diagnosis not present

## 2016-03-10 DIAGNOSIS — D509 Iron deficiency anemia, unspecified: Secondary | ICD-10-CM | POA: Diagnosis not present

## 2016-03-10 DIAGNOSIS — E119 Type 2 diabetes mellitus without complications: Secondary | ICD-10-CM | POA: Diagnosis not present

## 2016-03-10 DIAGNOSIS — Z23 Encounter for immunization: Secondary | ICD-10-CM | POA: Diagnosis not present

## 2016-03-10 DIAGNOSIS — N2581 Secondary hyperparathyroidism of renal origin: Secondary | ICD-10-CM | POA: Diagnosis not present

## 2016-03-10 DIAGNOSIS — N186 End stage renal disease: Secondary | ICD-10-CM | POA: Diagnosis not present

## 2016-03-13 DIAGNOSIS — E119 Type 2 diabetes mellitus without complications: Secondary | ICD-10-CM | POA: Diagnosis not present

## 2016-03-13 DIAGNOSIS — D509 Iron deficiency anemia, unspecified: Secondary | ICD-10-CM | POA: Diagnosis not present

## 2016-03-13 DIAGNOSIS — Z23 Encounter for immunization: Secondary | ICD-10-CM | POA: Diagnosis not present

## 2016-03-13 DIAGNOSIS — D631 Anemia in chronic kidney disease: Secondary | ICD-10-CM | POA: Diagnosis not present

## 2016-03-13 DIAGNOSIS — N2581 Secondary hyperparathyroidism of renal origin: Secondary | ICD-10-CM | POA: Diagnosis not present

## 2016-03-13 DIAGNOSIS — N186 End stage renal disease: Secondary | ICD-10-CM | POA: Diagnosis not present

## 2016-03-15 DIAGNOSIS — D509 Iron deficiency anemia, unspecified: Secondary | ICD-10-CM | POA: Diagnosis not present

## 2016-03-15 DIAGNOSIS — Z23 Encounter for immunization: Secondary | ICD-10-CM | POA: Diagnosis not present

## 2016-03-15 DIAGNOSIS — D631 Anemia in chronic kidney disease: Secondary | ICD-10-CM | POA: Diagnosis not present

## 2016-03-15 DIAGNOSIS — N2581 Secondary hyperparathyroidism of renal origin: Secondary | ICD-10-CM | POA: Diagnosis not present

## 2016-03-15 DIAGNOSIS — E119 Type 2 diabetes mellitus without complications: Secondary | ICD-10-CM | POA: Diagnosis not present

## 2016-03-15 DIAGNOSIS — N186 End stage renal disease: Secondary | ICD-10-CM | POA: Diagnosis not present

## 2016-03-17 DIAGNOSIS — N186 End stage renal disease: Secondary | ICD-10-CM | POA: Diagnosis not present

## 2016-03-17 DIAGNOSIS — D509 Iron deficiency anemia, unspecified: Secondary | ICD-10-CM | POA: Diagnosis not present

## 2016-03-17 DIAGNOSIS — Z23 Encounter for immunization: Secondary | ICD-10-CM | POA: Diagnosis not present

## 2016-03-17 DIAGNOSIS — N2581 Secondary hyperparathyroidism of renal origin: Secondary | ICD-10-CM | POA: Diagnosis not present

## 2016-03-17 DIAGNOSIS — E119 Type 2 diabetes mellitus without complications: Secondary | ICD-10-CM | POA: Diagnosis not present

## 2016-03-17 DIAGNOSIS — D631 Anemia in chronic kidney disease: Secondary | ICD-10-CM | POA: Diagnosis not present

## 2016-03-20 DIAGNOSIS — D509 Iron deficiency anemia, unspecified: Secondary | ICD-10-CM | POA: Diagnosis not present

## 2016-03-20 DIAGNOSIS — N186 End stage renal disease: Secondary | ICD-10-CM | POA: Diagnosis not present

## 2016-03-20 DIAGNOSIS — D631 Anemia in chronic kidney disease: Secondary | ICD-10-CM | POA: Diagnosis not present

## 2016-03-20 DIAGNOSIS — E119 Type 2 diabetes mellitus without complications: Secondary | ICD-10-CM | POA: Diagnosis not present

## 2016-03-20 DIAGNOSIS — N2581 Secondary hyperparathyroidism of renal origin: Secondary | ICD-10-CM | POA: Diagnosis not present

## 2016-03-20 DIAGNOSIS — Z23 Encounter for immunization: Secondary | ICD-10-CM | POA: Diagnosis not present

## 2016-03-22 DIAGNOSIS — N2581 Secondary hyperparathyroidism of renal origin: Secondary | ICD-10-CM | POA: Diagnosis not present

## 2016-03-22 DIAGNOSIS — Z23 Encounter for immunization: Secondary | ICD-10-CM | POA: Diagnosis not present

## 2016-03-22 DIAGNOSIS — N186 End stage renal disease: Secondary | ICD-10-CM | POA: Diagnosis not present

## 2016-03-22 DIAGNOSIS — D631 Anemia in chronic kidney disease: Secondary | ICD-10-CM | POA: Diagnosis not present

## 2016-03-22 DIAGNOSIS — D509 Iron deficiency anemia, unspecified: Secondary | ICD-10-CM | POA: Diagnosis not present

## 2016-03-22 DIAGNOSIS — E119 Type 2 diabetes mellitus without complications: Secondary | ICD-10-CM | POA: Diagnosis not present

## 2016-03-24 DIAGNOSIS — D509 Iron deficiency anemia, unspecified: Secondary | ICD-10-CM | POA: Diagnosis not present

## 2016-03-24 DIAGNOSIS — N186 End stage renal disease: Secondary | ICD-10-CM | POA: Diagnosis not present

## 2016-03-24 DIAGNOSIS — D631 Anemia in chronic kidney disease: Secondary | ICD-10-CM | POA: Diagnosis not present

## 2016-03-24 DIAGNOSIS — N2581 Secondary hyperparathyroidism of renal origin: Secondary | ICD-10-CM | POA: Diagnosis not present

## 2016-03-24 DIAGNOSIS — Z23 Encounter for immunization: Secondary | ICD-10-CM | POA: Diagnosis not present

## 2016-03-24 DIAGNOSIS — E119 Type 2 diabetes mellitus without complications: Secondary | ICD-10-CM | POA: Diagnosis not present

## 2016-03-24 DIAGNOSIS — M2041 Other hammer toe(s) (acquired), right foot: Secondary | ICD-10-CM | POA: Diagnosis not present

## 2016-03-24 DIAGNOSIS — L84 Corns and callosities: Secondary | ICD-10-CM | POA: Diagnosis not present

## 2016-03-24 DIAGNOSIS — M79671 Pain in right foot: Secondary | ICD-10-CM | POA: Diagnosis not present

## 2016-03-24 DIAGNOSIS — I120 Hypertensive chronic kidney disease with stage 5 chronic kidney disease or end stage renal disease: Secondary | ICD-10-CM | POA: Diagnosis not present

## 2016-03-24 DIAGNOSIS — B351 Tinea unguium: Secondary | ICD-10-CM | POA: Diagnosis not present

## 2016-03-24 DIAGNOSIS — I739 Peripheral vascular disease, unspecified: Secondary | ICD-10-CM | POA: Diagnosis not present

## 2016-03-27 DIAGNOSIS — E119 Type 2 diabetes mellitus without complications: Secondary | ICD-10-CM | POA: Diagnosis not present

## 2016-03-27 DIAGNOSIS — D631 Anemia in chronic kidney disease: Secondary | ICD-10-CM | POA: Diagnosis not present

## 2016-03-27 DIAGNOSIS — D509 Iron deficiency anemia, unspecified: Secondary | ICD-10-CM | POA: Diagnosis not present

## 2016-03-27 DIAGNOSIS — Z23 Encounter for immunization: Secondary | ICD-10-CM | POA: Diagnosis not present

## 2016-03-27 DIAGNOSIS — N2581 Secondary hyperparathyroidism of renal origin: Secondary | ICD-10-CM | POA: Diagnosis not present

## 2016-03-27 DIAGNOSIS — N186 End stage renal disease: Secondary | ICD-10-CM | POA: Diagnosis not present

## 2016-03-29 DIAGNOSIS — N186 End stage renal disease: Secondary | ICD-10-CM | POA: Diagnosis not present

## 2016-03-29 DIAGNOSIS — E119 Type 2 diabetes mellitus without complications: Secondary | ICD-10-CM | POA: Diagnosis not present

## 2016-03-29 DIAGNOSIS — N2581 Secondary hyperparathyroidism of renal origin: Secondary | ICD-10-CM | POA: Diagnosis not present

## 2016-03-29 DIAGNOSIS — Z23 Encounter for immunization: Secondary | ICD-10-CM | POA: Diagnosis not present

## 2016-03-29 DIAGNOSIS — D631 Anemia in chronic kidney disease: Secondary | ICD-10-CM | POA: Diagnosis not present

## 2016-03-29 DIAGNOSIS — D509 Iron deficiency anemia, unspecified: Secondary | ICD-10-CM | POA: Diagnosis not present

## 2016-03-30 DIAGNOSIS — R3915 Urgency of urination: Secondary | ICD-10-CM | POA: Diagnosis not present

## 2016-03-30 DIAGNOSIS — N281 Cyst of kidney, acquired: Secondary | ICD-10-CM | POA: Diagnosis not present

## 2016-03-30 DIAGNOSIS — D1771 Benign lipomatous neoplasm of kidney: Secondary | ICD-10-CM | POA: Diagnosis not present

## 2016-03-31 DIAGNOSIS — N2581 Secondary hyperparathyroidism of renal origin: Secondary | ICD-10-CM | POA: Diagnosis not present

## 2016-03-31 DIAGNOSIS — D631 Anemia in chronic kidney disease: Secondary | ICD-10-CM | POA: Diagnosis not present

## 2016-03-31 DIAGNOSIS — D509 Iron deficiency anemia, unspecified: Secondary | ICD-10-CM | POA: Diagnosis not present

## 2016-03-31 DIAGNOSIS — Z23 Encounter for immunization: Secondary | ICD-10-CM | POA: Diagnosis not present

## 2016-03-31 DIAGNOSIS — N186 End stage renal disease: Secondary | ICD-10-CM | POA: Diagnosis not present

## 2016-03-31 DIAGNOSIS — E119 Type 2 diabetes mellitus without complications: Secondary | ICD-10-CM | POA: Diagnosis not present

## 2016-04-01 DIAGNOSIS — Z992 Dependence on renal dialysis: Secondary | ICD-10-CM | POA: Diagnosis not present

## 2016-04-01 DIAGNOSIS — N186 End stage renal disease: Secondary | ICD-10-CM | POA: Diagnosis not present

## 2016-04-01 DIAGNOSIS — E1129 Type 2 diabetes mellitus with other diabetic kidney complication: Secondary | ICD-10-CM | POA: Diagnosis not present

## 2016-04-03 DIAGNOSIS — E039 Hypothyroidism, unspecified: Secondary | ICD-10-CM | POA: Diagnosis not present

## 2016-04-03 DIAGNOSIS — I130 Hypertensive heart and chronic kidney disease with heart failure and stage 1 through stage 4 chronic kidney disease, or unspecified chronic kidney disease: Secondary | ICD-10-CM | POA: Diagnosis not present

## 2016-04-03 DIAGNOSIS — I5032 Chronic diastolic (congestive) heart failure: Secondary | ICD-10-CM | POA: Diagnosis not present

## 2016-04-03 DIAGNOSIS — N186 End stage renal disease: Secondary | ICD-10-CM | POA: Diagnosis not present

## 2016-04-03 DIAGNOSIS — I132 Hypertensive heart and chronic kidney disease with heart failure and with stage 5 chronic kidney disease, or end stage renal disease: Secondary | ICD-10-CM | POA: Diagnosis not present

## 2016-04-03 DIAGNOSIS — E782 Mixed hyperlipidemia: Secondary | ICD-10-CM | POA: Diagnosis not present

## 2016-04-03 DIAGNOSIS — D631 Anemia in chronic kidney disease: Secondary | ICD-10-CM | POA: Diagnosis not present

## 2016-04-03 DIAGNOSIS — D509 Iron deficiency anemia, unspecified: Secondary | ICD-10-CM | POA: Diagnosis not present

## 2016-04-03 DIAGNOSIS — E119 Type 2 diabetes mellitus without complications: Secondary | ICD-10-CM | POA: Diagnosis not present

## 2016-04-03 DIAGNOSIS — N2581 Secondary hyperparathyroidism of renal origin: Secondary | ICD-10-CM | POA: Diagnosis not present

## 2016-04-05 ENCOUNTER — Telehealth: Payer: Self-pay | Admitting: Pulmonary Disease

## 2016-04-05 DIAGNOSIS — E119 Type 2 diabetes mellitus without complications: Secondary | ICD-10-CM | POA: Diagnosis not present

## 2016-04-05 DIAGNOSIS — N186 End stage renal disease: Secondary | ICD-10-CM | POA: Diagnosis not present

## 2016-04-05 DIAGNOSIS — D509 Iron deficiency anemia, unspecified: Secondary | ICD-10-CM | POA: Diagnosis not present

## 2016-04-05 DIAGNOSIS — N2581 Secondary hyperparathyroidism of renal origin: Secondary | ICD-10-CM | POA: Diagnosis not present

## 2016-04-05 DIAGNOSIS — D631 Anemia in chronic kidney disease: Secondary | ICD-10-CM | POA: Diagnosis not present

## 2016-04-05 NOTE — Telephone Encounter (Signed)
Spoke with the pt  She states that she was told by [facility] today that the letairis will now cost $140 per month  She states that this is due to her grant running out  She is asking for a replacement  She has 6 doses remaining after today  Please advise thanks!

## 2016-04-07 DIAGNOSIS — E119 Type 2 diabetes mellitus without complications: Secondary | ICD-10-CM | POA: Diagnosis not present

## 2016-04-07 DIAGNOSIS — D509 Iron deficiency anemia, unspecified: Secondary | ICD-10-CM | POA: Diagnosis not present

## 2016-04-07 DIAGNOSIS — D631 Anemia in chronic kidney disease: Secondary | ICD-10-CM | POA: Diagnosis not present

## 2016-04-07 DIAGNOSIS — N2581 Secondary hyperparathyroidism of renal origin: Secondary | ICD-10-CM | POA: Diagnosis not present

## 2016-04-07 DIAGNOSIS — N186 End stage renal disease: Secondary | ICD-10-CM | POA: Diagnosis not present

## 2016-04-09 NOTE — Telephone Encounter (Signed)
Is this with the generic form of the medicine?

## 2016-04-10 DIAGNOSIS — D509 Iron deficiency anemia, unspecified: Secondary | ICD-10-CM | POA: Diagnosis not present

## 2016-04-10 DIAGNOSIS — N186 End stage renal disease: Secondary | ICD-10-CM | POA: Diagnosis not present

## 2016-04-10 DIAGNOSIS — N2581 Secondary hyperparathyroidism of renal origin: Secondary | ICD-10-CM | POA: Diagnosis not present

## 2016-04-10 DIAGNOSIS — E119 Type 2 diabetes mellitus without complications: Secondary | ICD-10-CM | POA: Diagnosis not present

## 2016-04-10 DIAGNOSIS — D631 Anemia in chronic kidney disease: Secondary | ICD-10-CM | POA: Diagnosis not present

## 2016-04-10 NOTE — Telephone Encounter (Signed)
Spoke with [facility] who stated pt has filled out a co-pay assistance form and was approved. Her new co-pay is $0. They are just waiting on the pt to reach back to them to schedule a deliver. LMTCB to pt   Forwarded to BQ as fyi

## 2016-04-10 NOTE — Telephone Encounter (Signed)
thanks

## 2016-04-12 DIAGNOSIS — E119 Type 2 diabetes mellitus without complications: Secondary | ICD-10-CM | POA: Diagnosis not present

## 2016-04-12 DIAGNOSIS — N2581 Secondary hyperparathyroidism of renal origin: Secondary | ICD-10-CM | POA: Diagnosis not present

## 2016-04-12 DIAGNOSIS — D631 Anemia in chronic kidney disease: Secondary | ICD-10-CM | POA: Diagnosis not present

## 2016-04-12 DIAGNOSIS — D509 Iron deficiency anemia, unspecified: Secondary | ICD-10-CM | POA: Diagnosis not present

## 2016-04-12 DIAGNOSIS — N186 End stage renal disease: Secondary | ICD-10-CM | POA: Diagnosis not present

## 2016-04-14 DIAGNOSIS — N2581 Secondary hyperparathyroidism of renal origin: Secondary | ICD-10-CM | POA: Diagnosis not present

## 2016-04-14 DIAGNOSIS — D509 Iron deficiency anemia, unspecified: Secondary | ICD-10-CM | POA: Diagnosis not present

## 2016-04-14 DIAGNOSIS — N186 End stage renal disease: Secondary | ICD-10-CM | POA: Diagnosis not present

## 2016-04-14 DIAGNOSIS — D631 Anemia in chronic kidney disease: Secondary | ICD-10-CM | POA: Diagnosis not present

## 2016-04-14 DIAGNOSIS — E119 Type 2 diabetes mellitus without complications: Secondary | ICD-10-CM | POA: Diagnosis not present

## 2016-04-17 DIAGNOSIS — D509 Iron deficiency anemia, unspecified: Secondary | ICD-10-CM | POA: Diagnosis not present

## 2016-04-17 DIAGNOSIS — N2581 Secondary hyperparathyroidism of renal origin: Secondary | ICD-10-CM | POA: Diagnosis not present

## 2016-04-17 DIAGNOSIS — E119 Type 2 diabetes mellitus without complications: Secondary | ICD-10-CM | POA: Diagnosis not present

## 2016-04-17 DIAGNOSIS — N186 End stage renal disease: Secondary | ICD-10-CM | POA: Diagnosis not present

## 2016-04-17 DIAGNOSIS — D631 Anemia in chronic kidney disease: Secondary | ICD-10-CM | POA: Diagnosis not present

## 2016-04-19 DIAGNOSIS — D509 Iron deficiency anemia, unspecified: Secondary | ICD-10-CM | POA: Diagnosis not present

## 2016-04-19 DIAGNOSIS — D631 Anemia in chronic kidney disease: Secondary | ICD-10-CM | POA: Diagnosis not present

## 2016-04-19 DIAGNOSIS — N2581 Secondary hyperparathyroidism of renal origin: Secondary | ICD-10-CM | POA: Diagnosis not present

## 2016-04-19 DIAGNOSIS — N186 End stage renal disease: Secondary | ICD-10-CM | POA: Diagnosis not present

## 2016-04-19 DIAGNOSIS — E119 Type 2 diabetes mellitus without complications: Secondary | ICD-10-CM | POA: Diagnosis not present

## 2016-04-20 DIAGNOSIS — T82858A Stenosis of vascular prosthetic devices, implants and grafts, initial encounter: Secondary | ICD-10-CM | POA: Diagnosis not present

## 2016-04-20 DIAGNOSIS — N186 End stage renal disease: Secondary | ICD-10-CM | POA: Diagnosis not present

## 2016-04-20 DIAGNOSIS — I871 Compression of vein: Secondary | ICD-10-CM | POA: Diagnosis not present

## 2016-04-20 DIAGNOSIS — Z992 Dependence on renal dialysis: Secondary | ICD-10-CM | POA: Diagnosis not present

## 2016-04-21 DIAGNOSIS — D509 Iron deficiency anemia, unspecified: Secondary | ICD-10-CM | POA: Diagnosis not present

## 2016-04-21 DIAGNOSIS — N2581 Secondary hyperparathyroidism of renal origin: Secondary | ICD-10-CM | POA: Diagnosis not present

## 2016-04-21 DIAGNOSIS — E119 Type 2 diabetes mellitus without complications: Secondary | ICD-10-CM | POA: Diagnosis not present

## 2016-04-21 DIAGNOSIS — D631 Anemia in chronic kidney disease: Secondary | ICD-10-CM | POA: Diagnosis not present

## 2016-04-21 DIAGNOSIS — N186 End stage renal disease: Secondary | ICD-10-CM | POA: Diagnosis not present

## 2016-04-24 DIAGNOSIS — E119 Type 2 diabetes mellitus without complications: Secondary | ICD-10-CM | POA: Diagnosis not present

## 2016-04-24 DIAGNOSIS — N186 End stage renal disease: Secondary | ICD-10-CM | POA: Diagnosis not present

## 2016-04-24 DIAGNOSIS — N2581 Secondary hyperparathyroidism of renal origin: Secondary | ICD-10-CM | POA: Diagnosis not present

## 2016-04-24 DIAGNOSIS — D631 Anemia in chronic kidney disease: Secondary | ICD-10-CM | POA: Diagnosis not present

## 2016-04-24 DIAGNOSIS — D509 Iron deficiency anemia, unspecified: Secondary | ICD-10-CM | POA: Diagnosis not present

## 2016-04-26 DIAGNOSIS — N2581 Secondary hyperparathyroidism of renal origin: Secondary | ICD-10-CM | POA: Diagnosis not present

## 2016-04-26 DIAGNOSIS — D509 Iron deficiency anemia, unspecified: Secondary | ICD-10-CM | POA: Diagnosis not present

## 2016-04-26 DIAGNOSIS — E119 Type 2 diabetes mellitus without complications: Secondary | ICD-10-CM | POA: Diagnosis not present

## 2016-04-26 DIAGNOSIS — N186 End stage renal disease: Secondary | ICD-10-CM | POA: Diagnosis not present

## 2016-04-26 DIAGNOSIS — D631 Anemia in chronic kidney disease: Secondary | ICD-10-CM | POA: Diagnosis not present

## 2016-04-28 DIAGNOSIS — D631 Anemia in chronic kidney disease: Secondary | ICD-10-CM | POA: Diagnosis not present

## 2016-04-28 DIAGNOSIS — N2581 Secondary hyperparathyroidism of renal origin: Secondary | ICD-10-CM | POA: Diagnosis not present

## 2016-04-28 DIAGNOSIS — D509 Iron deficiency anemia, unspecified: Secondary | ICD-10-CM | POA: Diagnosis not present

## 2016-04-28 DIAGNOSIS — N186 End stage renal disease: Secondary | ICD-10-CM | POA: Diagnosis not present

## 2016-04-28 DIAGNOSIS — E119 Type 2 diabetes mellitus without complications: Secondary | ICD-10-CM | POA: Diagnosis not present

## 2016-05-01 DIAGNOSIS — E1129 Type 2 diabetes mellitus with other diabetic kidney complication: Secondary | ICD-10-CM | POA: Diagnosis not present

## 2016-05-01 DIAGNOSIS — E119 Type 2 diabetes mellitus without complications: Secondary | ICD-10-CM | POA: Diagnosis not present

## 2016-05-01 DIAGNOSIS — D509 Iron deficiency anemia, unspecified: Secondary | ICD-10-CM | POA: Diagnosis not present

## 2016-05-01 DIAGNOSIS — D631 Anemia in chronic kidney disease: Secondary | ICD-10-CM | POA: Diagnosis not present

## 2016-05-01 DIAGNOSIS — Z992 Dependence on renal dialysis: Secondary | ICD-10-CM | POA: Diagnosis not present

## 2016-05-01 DIAGNOSIS — N2581 Secondary hyperparathyroidism of renal origin: Secondary | ICD-10-CM | POA: Diagnosis not present

## 2016-05-01 DIAGNOSIS — N186 End stage renal disease: Secondary | ICD-10-CM | POA: Diagnosis not present

## 2016-05-03 ENCOUNTER — Other Ambulatory Visit (HOSPITAL_COMMUNITY): Payer: Self-pay | Admitting: Interventional Radiology

## 2016-05-03 DIAGNOSIS — D509 Iron deficiency anemia, unspecified: Secondary | ICD-10-CM | POA: Diagnosis not present

## 2016-05-03 DIAGNOSIS — E119 Type 2 diabetes mellitus without complications: Secondary | ICD-10-CM | POA: Diagnosis not present

## 2016-05-03 DIAGNOSIS — N2581 Secondary hyperparathyroidism of renal origin: Secondary | ICD-10-CM | POA: Diagnosis not present

## 2016-05-03 DIAGNOSIS — D1771 Benign lipomatous neoplasm of kidney: Secondary | ICD-10-CM

## 2016-05-03 DIAGNOSIS — N186 End stage renal disease: Secondary | ICD-10-CM | POA: Diagnosis not present

## 2016-05-03 DIAGNOSIS — D631 Anemia in chronic kidney disease: Secondary | ICD-10-CM | POA: Diagnosis not present

## 2016-05-04 ENCOUNTER — Encounter: Payer: Self-pay | Admitting: Family

## 2016-05-05 DIAGNOSIS — N186 End stage renal disease: Secondary | ICD-10-CM | POA: Diagnosis not present

## 2016-05-05 DIAGNOSIS — N2581 Secondary hyperparathyroidism of renal origin: Secondary | ICD-10-CM | POA: Diagnosis not present

## 2016-05-05 DIAGNOSIS — D631 Anemia in chronic kidney disease: Secondary | ICD-10-CM | POA: Diagnosis not present

## 2016-05-05 DIAGNOSIS — E119 Type 2 diabetes mellitus without complications: Secondary | ICD-10-CM | POA: Diagnosis not present

## 2016-05-05 DIAGNOSIS — D509 Iron deficiency anemia, unspecified: Secondary | ICD-10-CM | POA: Diagnosis not present

## 2016-05-08 DIAGNOSIS — N2581 Secondary hyperparathyroidism of renal origin: Secondary | ICD-10-CM | POA: Diagnosis not present

## 2016-05-08 DIAGNOSIS — D509 Iron deficiency anemia, unspecified: Secondary | ICD-10-CM | POA: Diagnosis not present

## 2016-05-08 DIAGNOSIS — E119 Type 2 diabetes mellitus without complications: Secondary | ICD-10-CM | POA: Diagnosis not present

## 2016-05-08 DIAGNOSIS — N186 End stage renal disease: Secondary | ICD-10-CM | POA: Diagnosis not present

## 2016-05-08 DIAGNOSIS — D631 Anemia in chronic kidney disease: Secondary | ICD-10-CM | POA: Diagnosis not present

## 2016-05-10 DIAGNOSIS — D509 Iron deficiency anemia, unspecified: Secondary | ICD-10-CM | POA: Diagnosis not present

## 2016-05-10 DIAGNOSIS — N186 End stage renal disease: Secondary | ICD-10-CM | POA: Diagnosis not present

## 2016-05-10 DIAGNOSIS — D631 Anemia in chronic kidney disease: Secondary | ICD-10-CM | POA: Diagnosis not present

## 2016-05-10 DIAGNOSIS — E119 Type 2 diabetes mellitus without complications: Secondary | ICD-10-CM | POA: Diagnosis not present

## 2016-05-10 DIAGNOSIS — N2581 Secondary hyperparathyroidism of renal origin: Secondary | ICD-10-CM | POA: Diagnosis not present

## 2016-05-12 ENCOUNTER — Ambulatory Visit (HOSPITAL_COMMUNITY)
Admission: RE | Admit: 2016-05-12 | Discharge: 2016-05-12 | Disposition: A | Discharge status: Home / Self Care | Payer: Medicare Other | Source: Ambulatory Visit | Attending: Family | Admitting: Family

## 2016-05-12 ENCOUNTER — Ambulatory Visit (INDEPENDENT_AMBULATORY_CARE_PROVIDER_SITE_OTHER): Payer: Medicare Other | Admitting: Family

## 2016-05-12 ENCOUNTER — Encounter: Payer: Self-pay | Admitting: Family

## 2016-05-12 VITALS — BP 132/73 | HR 70 | Temp 98.5°F | Resp 20 | Ht 62.5 in | Wt 170.0 lb

## 2016-05-12 DIAGNOSIS — Z992 Dependence on renal dialysis: Secondary | ICD-10-CM

## 2016-05-12 DIAGNOSIS — Z87891 Personal history of nicotine dependence: Secondary | ICD-10-CM

## 2016-05-12 DIAGNOSIS — I779 Disorder of arteries and arterioles, unspecified: Secondary | ICD-10-CM | POA: Diagnosis not present

## 2016-05-12 DIAGNOSIS — D509 Iron deficiency anemia, unspecified: Secondary | ICD-10-CM | POA: Diagnosis not present

## 2016-05-12 DIAGNOSIS — D631 Anemia in chronic kidney disease: Secondary | ICD-10-CM | POA: Diagnosis not present

## 2016-05-12 DIAGNOSIS — I6523 Occlusion and stenosis of bilateral carotid arteries: Secondary | ICD-10-CM

## 2016-05-12 DIAGNOSIS — E119 Type 2 diabetes mellitus without complications: Secondary | ICD-10-CM | POA: Diagnosis not present

## 2016-05-12 DIAGNOSIS — N2581 Secondary hyperparathyroidism of renal origin: Secondary | ICD-10-CM | POA: Diagnosis not present

## 2016-05-12 DIAGNOSIS — N186 End stage renal disease: Secondary | ICD-10-CM

## 2016-05-12 NOTE — Progress Notes (Signed)
[facility] [place]   CC: Follow up peripheral artery occlusive disease  History of Present Illness ROLLANDE Bradley is a 71 y.o. female patient of Dr. Bridgett Larsson who is s/p arteriogram with bilateral run offon 12/31/14 . The patient's lower extremity symptoms are unchanged: intermittent claudication. The patient is able to complete their activities of daily living. The patient denies rest pain at this point.  After walking about 60 feet, left calf starts hurting, relieved by rest, this seems to be worsening.  She denies any known problems with arthritis or back problems.  AV graft HD access is in her left upper arm, MWF are HD days. She denies any steal sx's in her left upper extremity.   She denies non healing wounds in her LE's.  She reports a stroke, MI, and seizures about 2005, started HD at that time.  Pt states her long term use of ibuprofen caused her kidney failure.   The patient denies New Medical or Surgical History.  Pt Diabetic: No Pt smoker: former smoker, quit in December 2017(started at age 1 yrs)  Pt meds include: Statin :Yes Betablocker: Yes ASA: Yes Other anticoagulants/antiplatelets: no    Past Medical History:  Diagnosis Date  . Arthritis of shoulder    Bilateral  . CAD (coronary artery disease)    stent to RCA  . Cancer ([place])    clear cell cancer, kidney  . Chronic renal insufficiency    On hemodialysis  . Complication of anesthesia 12/2010   pt is very confused, with AMS with anesthesia  . COPD (chronic obstructive pulmonary disease) (HCC) t  . CVA 07/27/2008   CVA affected cognition and memory per family, no focal deficits.    . CVA (cerebral infarction) 2003   no apparent residual  . Depression   . Diastolic congestive heart failure ([place])   . Dyslipidemia   . Encephalopathy   . ESRD 07/27/2008   ESRD due to HTN and NSAID's, started hemodialysis in 2005 in [place], [place]. Went to [facility] from 2010 to 2012  and since 2012 has been getting dialysis at [facility] on [address] in [place] on a MWF schedule. First access with RUA AVG placed in [place]. Next and current access was LUA AVG placed by Dr. Lucky Cowboy in [place] in or around 2012. Has had 2 or 3 procedures on that graft since placed per family. She gets her access work done here in [place] now. She is allergic to heparin and does not get any heparin at dialysis; she had an allergic reaction apparently when in ICU in the past.      . Hyperlipidemia   . Hypertension   . Hypothyroidism   . Positive PPD    completed rifampin  . PVD (peripheral vascular disease) ([place])   . Seizures [facility])     Social History Social History  Substance Use Topics  . Smoking status: Former Smoker    Packs/day: 0.50    Years: 53.00    Types: Cigarettes    Quit date: 12/17/2015  . Smokeless tobacco: Never Used     Comment: down to .25ppd  . Alcohol use 0.0 oz/week     Comment: very rarely per pt    Family History Family History  Problem Relation Age of Onset  . Heart disease Father   . Hypertension Mother   . Dementia Mother   . Coronary artery disease Sister   . Heart attack Sister   . Hypertension Brother  Past Surgical History:  Procedure Laterality Date  . ABDOMINAL HYSTERECTOMY    . CARDIAC CATHETERIZATION    . CATARACT EXTRACTION Bilateral   . CHOLECYSTECTOMY    . D&Cs    . LEFT HEART CATHETERIZATION WITH CORONARY ANGIOGRAM N/A 02/22/2011   Procedure: LEFT HEART CATHETERIZATION WITH CORONARY ANGIOGRAM;  Surgeon: Wellington Hampshire, MD;  Location: [facility] CATH LAB;  Service: Cardiovascular;  Laterality: N/A;  . left nephrectomy    . NEPHRECTOMY Left    Malignant tumor  . PERIPHERAL VASCULAR CATHETERIZATION N/A 12/31/2014   Procedure: Abdominal Aortogram;  Surgeon: Conrad [place] L[place]lei, MD;  Location: [facility] CV LAB;  Service: Cardiovascular;  Laterality: N/A;  . right ankle repair    . RIGHT HEART CATHETERIZATION N/A 01/29/2014   Procedure:  RIGHT HEART CATH;  Surgeon: Larey Dresser, MD;  Location[facility] CATH LAB;  Service: Cardiovascular;  Laterality: N/A;  . TONSILLECTOMY    . VASCULAR SURGERY  11/2010   graft inserted to left arm    Allergies  Allergen Reactions  . Ace Inhibitors Anaphylaxis and Rash  . Heparin Other (See Comments)    MDs told her not to take after reaction in ICU  . Iohexol Swelling and Other (See Comments)    1970s; passed out and had facial/tongue swelling.  Requires 13-hour prep with prednisone and benadryl  . Phenytoin Other (See Comments)    Had reaction while in ICU; doesn't know.  MDs told her not to take ever again.  [name] Phenytoin Sodium Extended Other (See Comments)  . Wellbutrin [Bupropion] Other (See Comments)    seizures  . Ampicillin Hives and Rash  . Meperidine Hcl Swelling and Rash    Makes tongue swell  . Morphine Rash  . Penicillins Rash    Has patient had a PCN reaction causing immediate rash, facial/tongue/throat swelling, SOB or lightheadedness with hypotension: Yes Has patient had a PCN reaction causing severe rash involving mucus membranes or skin necrosis: No Has patient had a PCN reaction that required hospitalization No Has patient had a PCN reaction occurring within the last 10 years: No If all of the above answers are "NO", then may proceed with Cephalosporin use.    [name] Pentazocine Rash  . Valproic Acid And Related Other (See Comments)    Confusion   . Iodinated Diagnostic Agents Other (See Comments)    Pre-meditate with benadryl and prednisone 3 times before appt.  . Pentazocine Lactate Other (See Comments)    Patient does not remember reaction to this med ([place]).     Current Outpatient Prescriptions  Medication Sig Dispense Refill  . acetaminophen (TYLENOL) 500 MG tablet Take 1,000 mg by mouth daily as needed for mild pain.     [name] albuterol (PROVENTIL HFA;VENTOLIN HFA) 108 (90 Base) MCG/ACT inhaler Inhale 1-2 puffs into the lungs every 6 (six) hours as needed for  wheezing or shortness of breath. 1 Inhaler 5  . ambrisentan (LETAIRIS) 5 MG tablet TAKE 1 TABLET (5 MG) ORALLY DAILY. 30 tablet 5  . amLODipine (NORVASC) 10 MG tablet Take 10 mg by mouth at bedtime.     [name] aspirin EC 81 MG tablet Take 81 mg by mouth daily.    . carvedilol (COREG) 25 MG tablet Take 25 mg by mouth 2 (two) times daily with a meal.    . diphenhydrAMINE (BENADRYL) 50 MG capsule Take 50 mg by mouth on 12/24/14, at 6:30 am. (Patient taking differently: Take 50 mg by mouth every Monday, Wednesday, and Friday.  On dialysis days) 1 capsule 0  . hydrALAZINE (APRESOLINE) 50 MG tablet Take 50 mg by mouth 3 (three) times daily.    . hydrOXYzine (ATARAX/VISTARIL) 25 MG tablet Take 25 mg by mouth daily as needed for itching. Reported on 12/16/2014  6  . IOPHEN C-NR 100-10 MG/5ML syrup     . isosorbide mononitrate (IMDUR) 30 MG 24 hr tablet Take 1 tablet (30 mg total) by mouth daily. PLEASE  CONTACT OFFICE FOR ADDITIONAL REFILLS 15 tablet 0  . levETIRAcetam (KEPPRA) 500 MG tablet Take 500 mg by mouth daily at 8 pm.    . levothyroxine (SYNTHROID, LEVOTHROID) 88 MCG tablet Take 88 mcg by mouth daily before breakfast.    . multivitamin (RENA-VIT) TABS tablet Take 1 tablet by mouth daily.    . nitroGLYCERIN (NITROSTAT) 0.4 MG SL tablet Place 1 tablet (0.4 mg total) under the tongue every 5 (five) minutes as needed for chest pain. 30 tablet 0  . omeprazole (PRILOSEC OTC) 20 MG tablet Take 20 mg by mouth daily.    . pravastatin (PRAVACHOL) 40 MG tablet Take 40 mg by mouth at bedtime.     . SENSIPAR 90 MG tablet Take 90 mg by mouth at bedtime.     . sevelamer carbonate (RENVELA) 800 MG tablet Take 1,600-3,200 mg by mouth 3 (three) times daily. 4 tabs TID, 2 tabs with each snack    . traMADol (ULTRAM) 50 MG tablet Take 50 mg by mouth daily as needed for moderate pain.     . traZODone (DESYREL) 50 MG tablet Take 50 mg by mouth at bedtime as needed for sleep.      No current facility-administered  medications for this visit.     ROS: See HPI for pertinent positives and negatives.   Physical Examination  Vitals:   05/12/16 1406  BP: 132/73  Pulse: 70  Resp: 20  Temp: 98.5 F (36.9 C)  TempSrc: Oral  SpO2: 97%  Weight: 170 lb (77.1 kg)  Height: 5' 2.5" (1.588 m)   Body mass index is 30.6 kg/m.  General: A&O x 3, WDWN obese female  Pulmonary: Sym exp, respirations are non labored, fair air movt except diminished in both bases,no rhonchi, rales, or wheezing.   Cardiac: RRR, Nl S1, S2, + murmur. Left upper arm AV fistula with palpable thrill.   Vascular: Vessel Right Left  Radial Palpable Not Palpable  Brachial Palpable Palpable  Carotid Palpable, without bruit Palpable, with bruit  Aorta Not palpable N/A  Femoral Palpable Palpable  Popliteal Not palpable Not palpable  PT Not Palpable Not Palpable  DP Not Palpable Not Palpable   Gastrointestinal: soft, NTND, no G/R, no HSM, no palpable masses, no CVAT B, mid-line incision healed  Musculoskeletal: M/S 5/5 throughout , Extremities without ischemic changes , R medial thigh incision healed, LUA AVG palpable with thrill and bruit  Neurologic: CN 2-12 grossly intact, Pain and light touch intact in extremities except decreased in feet, Motor exam      ASSESSMENT: [name] is a 71 y.o. female  who presents with: a history of bilateral LE stents [place] about [place] in another city. Bilateral SFA are occluded with extensive collateralization via the PFA according to angiogram in January 2017. She did not want bypass surgery in her legs; she will try maximal medical management.  She has moderate claudication in both calves that is not life limiting.  She has no signs of ischemia in her feet/legs. She stopped smoking in December 2017.  According to Dr. Rosezella Florida office visit note from 09-25-13, pt has CHF; therefore cilostazol is contraindicated for use  to try to alleviate her claudication symptoms.   She had a CVA in 2005.  DATA  ABI's (05/12/16): Right: 0.92 (0.70 on 11-11-15), waveforms: monophasic; TBI: dampened (was 0.31 on 11-11-15) Left: [place] and 0.40 (was 0.76 on 11-11-15), waveforms: monophasic; TBI: 0.18 (was 0.32 on 11-11-15) Unreliable ABI's due to lack of correlation between ankle pressures and Doppler waveforms which is most likley a result of medial calcification.    Carotid duplex on 11-11-15 suggests irregular calcific plaque in the bilateral ICA and ECA, <40%bilateral ICA stenosis. Bilateral vertebral artery flow is antegrade. Bilateral subclavian artery waveforms are normal. No previous carotid duplex exams for comparison.    Dr. Bridgett Larsson saw pt on 01/29/15. At that time the angiogram demonstrated bilateral occluded stents with occluded SFA with extensive collateralization via the PFA. She has brisk refilling of her AK popliteal arteries.  Based on her angiographic findings, this patient needs: no immediate intervention thus she might be a candidate for B fem-AK pop bypasses.  She does NOT want bypasses at this point and is making an attempt at maximal medical mgmt.  She knows she needs to quit smoking.   The patient is currently on a statin: Pravachol.                   The patient is currently on an anti-platelet: ASA.    PLAN:  Graduated walking program discussed and how to achieve. Daily seated leg exercises discussed and demonstrated.   Based on the patient's vascular studies and examination, and after discussing with Dr. Bridgett Larsson, pt will return to clinic in 3 months with ABI's and carotid duplex, see Dr. Bridgett Larsson afterward. I advised her to notify us if she develops concerns re the circulation in her feet or legs.   I discussed in depth with the patient the nature of atherosclerosis, and emphasized the importance of maximal medical management including strict control of blood pressure, blood glucose, and lipid  levels, obtaining regular exercise, and continued cessation of smoking.  The patient is aware that without maximal medical management the underlying atherosclerotic disease process will progress, limiting the benefit of any interventions.  The patient was given information about PAD including signs, symptoms, treatment, what symptoms should prompt the patient to seek immediate medical care, and risk reduction measures to take.  Clemon Chambers, RN, MSN, FNP-C [facility] Phone: [phone]  Clinic MD: Bridgett Larsson  05/12/16 2:15 PM

## 2016-05-12 NOTE — Patient Instructions (Signed)

## 2016-05-15 DIAGNOSIS — D631 Anemia in chronic kidney disease: Secondary | ICD-10-CM | POA: Diagnosis not present

## 2016-05-15 DIAGNOSIS — D509 Iron deficiency anemia, unspecified: Secondary | ICD-10-CM | POA: Diagnosis not present

## 2016-05-15 DIAGNOSIS — H00025 Hordeolum internum left lower eyelid: Secondary | ICD-10-CM | POA: Diagnosis not present

## 2016-05-15 DIAGNOSIS — E119 Type 2 diabetes mellitus without complications: Secondary | ICD-10-CM | POA: Diagnosis not present

## 2016-05-15 DIAGNOSIS — N2581 Secondary hyperparathyroidism of renal origin: Secondary | ICD-10-CM | POA: Diagnosis not present

## 2016-05-15 DIAGNOSIS — N186 End stage renal disease: Secondary | ICD-10-CM | POA: Diagnosis not present

## 2016-05-15 NOTE — Addendum Note (Signed)
Addended by: Lianne Cure A on: 05/15/2016 12:27 PM   Modules accepted: Orders

## 2016-05-17 DIAGNOSIS — N2581 Secondary hyperparathyroidism of renal origin: Secondary | ICD-10-CM | POA: Diagnosis not present

## 2016-05-17 DIAGNOSIS — D509 Iron deficiency anemia, unspecified: Secondary | ICD-10-CM | POA: Diagnosis not present

## 2016-05-17 DIAGNOSIS — D631 Anemia in chronic kidney disease: Secondary | ICD-10-CM | POA: Diagnosis not present

## 2016-05-17 DIAGNOSIS — E119 Type 2 diabetes mellitus without complications: Secondary | ICD-10-CM | POA: Diagnosis not present

## 2016-05-17 DIAGNOSIS — N186 End stage renal disease: Secondary | ICD-10-CM | POA: Diagnosis not present

## 2016-05-19 DIAGNOSIS — E119 Type 2 diabetes mellitus without complications: Secondary | ICD-10-CM | POA: Diagnosis not present

## 2016-05-19 DIAGNOSIS — N186 End stage renal disease: Secondary | ICD-10-CM | POA: Diagnosis not present

## 2016-05-19 DIAGNOSIS — D509 Iron deficiency anemia, unspecified: Secondary | ICD-10-CM | POA: Diagnosis not present

## 2016-05-19 DIAGNOSIS — N2581 Secondary hyperparathyroidism of renal origin: Secondary | ICD-10-CM | POA: Diagnosis not present

## 2016-05-19 DIAGNOSIS — D631 Anemia in chronic kidney disease: Secondary | ICD-10-CM | POA: Diagnosis not present

## 2016-05-22 DIAGNOSIS — N186 End stage renal disease: Secondary | ICD-10-CM | POA: Diagnosis not present

## 2016-05-22 DIAGNOSIS — N2581 Secondary hyperparathyroidism of renal origin: Secondary | ICD-10-CM | POA: Diagnosis not present

## 2016-05-22 DIAGNOSIS — E119 Type 2 diabetes mellitus without complications: Secondary | ICD-10-CM | POA: Diagnosis not present

## 2016-05-22 DIAGNOSIS — D631 Anemia in chronic kidney disease: Secondary | ICD-10-CM | POA: Diagnosis not present

## 2016-05-22 DIAGNOSIS — D509 Iron deficiency anemia, unspecified: Secondary | ICD-10-CM | POA: Diagnosis not present

## 2016-05-22 NOTE — Progress Notes (Signed)
Cardiology Office Note   Date:  05/25/2016   ID:  Bradley, Sarah 1945-10-13, MRN [id number]  PCP:  Glendale Chard, MD  Cardiologist:   Minus Breeding, MD    Chief Complaint  Patient presents with  . Coronary Artery Disease      History of Present Illness: [name] is a 71 y.o. female who presents for follow up of nonobsructive coronary disease. She has end-stage renal disease and is on dialysis. She has had an extensive workup for dyspnea and this included an echocardiogram. This demonstrated pulmonary pressure to be 107 with an EF of 35-40%. She did have an evaluation in our office for consideration of an MRI because of a possible speckleding on echo. However, she could not have this done as she has some problems with claustrophobia. She had SPEP sent off. There was no protein spike on the SPEP.  She had a right heart cath with a normal wedge.  She was started on Letairis.    She has not been back to see me in awhile.  She has had increased claudication and there was a question of starting Pletal but it was held because of her past history of CHF.  She has done well from a cardiovascular standpoint.  She walks her little dogs (Oreo and Chewy).  She is limited by leg pain and not by dyspnea.  She does not need to use NTG.  She denies PND or orthopnea.  She has no cough or edema.    Past Medical History:  Diagnosis Date  . Arthritis of shoulder    Bilateral  . CAD (coronary artery disease)    stent to RCA  . Cancer ([place])    clear cell cancer, kidney  . Chronic renal insufficiency    On hemodialysis  . Complication of anesthesia 12/2010   pt is very confused, with AMS with anesthesia  . COPD (chronic obstructive pulmonary disease) (HCC) t  . CVA 07/27/2008   CVA affected cognition and memory per family, no focal deficits.    . CVA (cerebral infarction) 2003   no apparent residual  . Depression   . Diastolic congestive heart failure ([place])   . Dyslipidemia   .  Encephalopathy   . ESRD 07/27/2008   ESRD due to HTN and NSAID's, started hemodialysis in 2005 in [place], [place]. Went to [facility] from 2010 to 2012 and since 2012 has been getting dialysis at [facility] on [address] in [place] on a MWF schedule. First access with RUA AVG placed in [place]. Next and current access was LUA AVG placed by Dr. Lucky Cowboy in [place] in or around 2012. Has had 2 or 3 procedures on that graft since placed per family. She gets her access work done here in [place] now. She is allergic to heparin and does not get any heparin at dialysis; she had an allergic reaction apparently when in ICU in the past.      . Hyperlipidemia   . Hypertension   . Hypothyroidism   . Positive PPD    completed rifampin  . PVD (peripheral vascular disease) ([place])   . Seizures ([place])     Past Surgical History:  Procedure Laterality Date  . ABDOMINAL HYSTERECTOMY    . CARDIAC CATHETERIZATION    . CATARACT EXTRACTION Bilateral   . CHOLECYSTECTOMY    . D&Cs    . LEFT HEART CATHETERIZATION WITH CORONARY ANGIOGRAM N/A 02/22/2011   Procedure: LEFT HEART CATHETERIZATION WITH CORONARY ANGIOGRAM;  Surgeon: Wellington Hampshire, MD;  Location[facility] CATH LAB;  Service: Cardiovascular;  Laterality: N/A;  . left nephrectomy    . NEPHRECTOMY Left    Malignant tumor  . PERIPHERAL VASCULAR CATHETERIZATION N/A 12/31/2014   Procedure: Abdominal Aortogram;  Surgeon: Conrad [place], MD;  Location: [facility] CV LAB;  Service: Cardiovascular;  Laterality: N/A;  . right ankle repair    . RIGHT HEART CATHETERIZATION N/A 01/29/2014   Procedure: RIGHT HEART CATH;  Surgeon: Larey Dresser, MD;  Location: [facility] CATH LAB;  Service: Cardiovascular;  Laterality: N/A;  . TONSILLECTOMY    . VASCULAR SURGERY  11/2010   graft inserted to left arm     Current Outpatient Prescriptions  Medication Sig Dispense Refill  . acetaminophen (TYLENOL) 500 MG tablet Take 1,000 mg by mouth daily as needed for mild pain.       [name] albuterol (PROVENTIL HFA;VENTOLIN HFA) 108 (90 Base) MCG/ACT inhaler Inhale 1-2 puffs into the lungs every 6 (six) hours as needed for wheezing or shortness of breath. 1 Inhaler 5  . ambrisentan (LETAIRIS) 5 MG tablet TAKE 1 TABLET (5 MG) ORALLY DAILY. 30 tablet 5  . amLODipine (NORVASC) 10 MG tablet Take 10 mg by mouth at bedtime.     [name] aspirin EC 81 MG tablet Take 81 mg by mouth daily.    . carvedilol (COREG) 25 MG tablet Take 25 mg by mouth 2 (two) times daily with a meal.    . diphenhydrAMINE (BENADRYL) 50 MG capsule Take 50 mg by mouth on 12/24/14, at 6:30 am. (Patient taking differently: Take 50 mg by mouth every Monday, Wednesday, and Friday. On dialysis days) 1 capsule 0  . hydrALAZINE (APRESOLINE) 50 MG tablet Take 50 mg by mouth 3 (three) times daily.    . hydrOXYzine (ATARAX/VISTARIL) 25 MG tablet Take 25 mg by mouth daily as needed for itching. Reported on 12/16/2014  6  . IOPHEN C-NR 100-10 MG/5ML syrup     . isosorbide mononitrate (IMDUR) 30 MG 24 hr tablet Take 1 tablet (30 mg total) by mouth daily. PLEASE  CONTACT OFFICE FOR ADDITIONAL REFILLS 15 tablet 0  . levETIRAcetam (KEPPRA) 500 MG tablet Take 500 mg by mouth daily at 8 pm.    . levothyroxine (SYNTHROID, LEVOTHROID) 88 MCG tablet Take 88 mcg by mouth daily before breakfast.    . multivitamin (RENA-VIT) TABS tablet Take 1 tablet by mouth daily.    . nitroGLYCERIN (NITROSTAT) 0.4 MG SL tablet Place 1 tablet (0.4 mg total) under the tongue every 5 (five) minutes as needed for chest pain. 30 tablet 0  . omeprazole (PRILOSEC OTC) 20 MG tablet Take 20 mg by mouth daily.    . pravastatin (PRAVACHOL) 40 MG tablet Take 1 tablet (40 mg total) by mouth at bedtime. 90 tablet 3  . SENSIPAR 90 MG tablet Take 90 mg by mouth at bedtime.     . sevelamer carbonate (RENVELA) 800 MG tablet Take 1,600-3,200 mg by mouth 3 (three) times daily. 4 tabs TID, 2 tabs with each snack    . traMADol (ULTRAM) 50 MG tablet Take 50 mg by mouth daily as  needed for moderate pain.     . traZODone (DESYREL) 50 MG tablet Take 50 mg by mouth at bedtime as needed for sleep.     . cilostazol (PLETAL) 100 MG tablet Take 1 tablet (100 mg total) by mouth 2 (two) times daily before a meal. 60 tablet 11   No current facility-administered medications  for this visit.     Allergies:   Ace inhibitors; Heparin; Iohexol; Phenytoin; Phenytoin sodium extended; Wellbutrin [bupropion]; Ampicillin; Meperidine hcl; Morphine; Penicillins; Pentazocine; Valproic acid and related; Iodinated diagnostic agents; and Pentazocine lactate    ROS:  Please see the history of present illness.   Otherwise, review of systems are positive for none.   All other systems are reviewed and negative.    PHYSICAL EXAM: VS:  BP 100/60   Pulse 71   Ht 5' 2.5" (1.588 m)   Wt 176 lb (79.8 kg)   BMI 31.68 kg/m  , BMI Body mass index is 31.68 kg/m.  GENERAL:  Well appearing  NECK:  Positive jugular venous distention 7 cm at 45 degrees, waveform within normal limits, carotid upstroke brisk and symmetric, no bruits, no thyromegaly LUNGS:  Clear to auscultation bilaterally CHEST:  Unremarkable HEART:  PMI not displaced or sustained,S1 and S2 within normal limits, no S3, no S4, no clicks, no rubs, 2/6 apical systolic murmur, no diastolic murmurs ABD:  Flat, positive bowel sounds normal in frequency in pitch, no bruits, no rebound, no guarding, no midline pulsatile mass, no hepatomegaly, no splenomegaly EXT:  2 plus pulses upper and absent DP/PT bilateral lower, no edema, no cyanosis no clubbing    EKG:  EKG is ordered today. The ekg ordered today demonstrates sinus rhythm, rate 71, old inferior infarct, poor anterior R wave progression, QTC slightly prolonged, no acute ST-T wave changes.   Recent Labs: 08/24/2015: BUN 44; Creatinine, Ser 11.72; Hemoglobin 10.7; Platelets 149; Potassium 5.0; Sodium 136 02/03/2016: ALT 58    Lipid Panel    Component Value Date/Time   CHOL 159  02/22/2011 1815   TRIG 140 02/22/2011 1815   HDL 44 02/22/2011 1815   CHOLHDL 3.6 02/22/2011 1815   VLDL 28 02/22/2011 1815   LDLCALC 87 02/22/2011 1815      Wt Readings from Last 3 Encounters:  05/23/16 176 lb (79.8 kg)  05/12/16 170 lb (77.1 kg)  02/03/16 161 lb (73 kg)      Other studies Reviewed: Additional studies/ records that were reviewed today include: Vascular surgery records. Review of the above records demonstrates:  Please see elsewhere in the note.     ASSESSMENT AND PLAN:  CHRONIC SYSTOLIC/DIASTOLIC HF:  She seems to be   PULMONARY HTN:   This is being managed as above and she is having no new symptoms.    CAD:  The patient has no new sypmtoms.  No further cardiovascular testing is indicated.  We will continue with aggressive risk reduction and meds as listed.    TOBACCO ABUSE:   She stopped smoking!!  DYSLIPIDEMIA:  She has been out of her Pravachol and her LDL in April reflected this.  (111).  She was given a new prescription for this.     CLAUDICATION:  I think it would be reasonable, if vascular surgery thinks it might help, for her to try Pletal despite her CHF.     Current medicines are reviewed at length with the patient today.  The patient does not have concerns regarding medicines.  The following changes have been made:  no change  Labs/ tests ordered today include: None  Orders Placed This Encounter  Procedures  . EKG 12-Lead     Disposition:   FU with me in one year.     Signed, Minus Breeding, MD  05/25/2016 8:57 PM    [place]

## 2016-05-23 ENCOUNTER — Telehealth: Payer: Self-pay | Admitting: Family

## 2016-05-23 ENCOUNTER — Encounter: Payer: Self-pay | Admitting: Cardiology

## 2016-05-23 ENCOUNTER — Ambulatory Visit (INDEPENDENT_AMBULATORY_CARE_PROVIDER_SITE_OTHER): Payer: Medicare Other | Admitting: Cardiology

## 2016-05-23 VITALS — BP 100/60 | HR 71 | Ht 62.5 in | Wt 176.0 lb

## 2016-05-23 DIAGNOSIS — I779 Disorder of arteries and arterioles, unspecified: Secondary | ICD-10-CM

## 2016-05-23 DIAGNOSIS — I739 Peripheral vascular disease, unspecified: Secondary | ICD-10-CM

## 2016-05-23 DIAGNOSIS — I2510 Atherosclerotic heart disease of native coronary artery without angina pectoris: Secondary | ICD-10-CM

## 2016-05-23 DIAGNOSIS — E785 Hyperlipidemia, unspecified: Secondary | ICD-10-CM

## 2016-05-23 DIAGNOSIS — I10 Essential (primary) hypertension: Secondary | ICD-10-CM

## 2016-05-23 MED ORDER — PRAVASTATIN SODIUM 40 MG PO TABS: 40.0000 mg | ORAL_TABLET | Freq: Every day | ORAL | 3 refills | Status: DC

## 2016-05-23 NOTE — Telephone Encounter (Signed)
-----   Message from Minus Breeding, MD sent at 05/23/2016  4:21 PM EDT ----- She tolerated Pletal before.  I think she could try it again if you though it would give relief.  She is pretty uncomfortable with claudication.

## 2016-05-23 NOTE — Patient Instructions (Signed)
No changes with medication   congratulations on quitting smoking   Your physician wants you to follow-up in 12 months with DR [facility] You will receive a reminder letter in the mail two months in advance. If you don't receive a letter, please call our office to schedule the follow-up appointment.   If you need a refill on your cardiac medications before your next appointment, please call your pharmacy.

## 2016-05-24 ENCOUNTER — Other Ambulatory Visit: Payer: Self-pay | Admitting: Family

## 2016-05-24 ENCOUNTER — Other Ambulatory Visit: Payer: Self-pay | Admitting: Vascular Surgery

## 2016-05-24 DIAGNOSIS — I739 Peripheral vascular disease, unspecified: Secondary | ICD-10-CM

## 2016-05-24 DIAGNOSIS — D631 Anemia in chronic kidney disease: Secondary | ICD-10-CM | POA: Diagnosis not present

## 2016-05-24 DIAGNOSIS — I25709 Atherosclerosis of coronary artery bypass graft(s), unspecified, with unspecified angina pectoris: Secondary | ICD-10-CM

## 2016-05-24 DIAGNOSIS — N2581 Secondary hyperparathyroidism of renal origin: Secondary | ICD-10-CM | POA: Diagnosis not present

## 2016-05-24 DIAGNOSIS — N186 End stage renal disease: Secondary | ICD-10-CM | POA: Diagnosis not present

## 2016-05-24 DIAGNOSIS — D509 Iron deficiency anemia, unspecified: Secondary | ICD-10-CM | POA: Diagnosis not present

## 2016-05-24 DIAGNOSIS — E119 Type 2 diabetes mellitus without complications: Secondary | ICD-10-CM | POA: Diagnosis not present

## 2016-05-24 MED ORDER — CILOSTAZOL 100 MG PO TABS
100.0000 mg | ORAL_TABLET | Freq: Two times a day (BID) | ORAL | 11 refills | Status: DC
Start: 2016-05-24 — End: 2017-06-19

## 2016-05-25 DIAGNOSIS — E785 Hyperlipidemia, unspecified: Secondary | ICD-10-CM | POA: Insufficient documentation

## 2016-05-26 DIAGNOSIS — D509 Iron deficiency anemia, unspecified: Secondary | ICD-10-CM | POA: Diagnosis not present

## 2016-05-26 DIAGNOSIS — D631 Anemia in chronic kidney disease: Secondary | ICD-10-CM | POA: Diagnosis not present

## 2016-05-26 DIAGNOSIS — N186 End stage renal disease: Secondary | ICD-10-CM | POA: Diagnosis not present

## 2016-05-26 DIAGNOSIS — E119 Type 2 diabetes mellitus without complications: Secondary | ICD-10-CM | POA: Diagnosis not present

## 2016-05-26 DIAGNOSIS — N2581 Secondary hyperparathyroidism of renal origin: Secondary | ICD-10-CM | POA: Diagnosis not present

## 2016-05-29 DIAGNOSIS — D509 Iron deficiency anemia, unspecified: Secondary | ICD-10-CM | POA: Diagnosis not present

## 2016-05-29 DIAGNOSIS — E119 Type 2 diabetes mellitus without complications: Secondary | ICD-10-CM | POA: Diagnosis not present

## 2016-05-29 DIAGNOSIS — D631 Anemia in chronic kidney disease: Secondary | ICD-10-CM | POA: Diagnosis not present

## 2016-05-29 DIAGNOSIS — N2581 Secondary hyperparathyroidism of renal origin: Secondary | ICD-10-CM | POA: Diagnosis not present

## 2016-05-29 DIAGNOSIS — N186 End stage renal disease: Secondary | ICD-10-CM | POA: Diagnosis not present

## 2016-05-31 DIAGNOSIS — N2581 Secondary hyperparathyroidism of renal origin: Secondary | ICD-10-CM | POA: Diagnosis not present

## 2016-05-31 DIAGNOSIS — D631 Anemia in chronic kidney disease: Secondary | ICD-10-CM | POA: Diagnosis not present

## 2016-05-31 DIAGNOSIS — E119 Type 2 diabetes mellitus without complications: Secondary | ICD-10-CM | POA: Diagnosis not present

## 2016-05-31 DIAGNOSIS — D509 Iron deficiency anemia, unspecified: Secondary | ICD-10-CM | POA: Diagnosis not present

## 2016-05-31 DIAGNOSIS — N186 End stage renal disease: Secondary | ICD-10-CM | POA: Diagnosis not present

## 2016-06-01 DIAGNOSIS — Z992 Dependence on renal dialysis: Secondary | ICD-10-CM | POA: Diagnosis not present

## 2016-06-01 DIAGNOSIS — H00025 Hordeolum internum left lower eyelid: Secondary | ICD-10-CM | POA: Diagnosis not present

## 2016-06-01 DIAGNOSIS — E1129 Type 2 diabetes mellitus with other diabetic kidney complication: Secondary | ICD-10-CM | POA: Diagnosis not present

## 2016-06-01 DIAGNOSIS — N186 End stage renal disease: Secondary | ICD-10-CM | POA: Diagnosis not present

## 2016-06-02 DIAGNOSIS — D509 Iron deficiency anemia, unspecified: Secondary | ICD-10-CM | POA: Diagnosis not present

## 2016-06-02 DIAGNOSIS — E119 Type 2 diabetes mellitus without complications: Secondary | ICD-10-CM | POA: Diagnosis not present

## 2016-06-02 DIAGNOSIS — N186 End stage renal disease: Secondary | ICD-10-CM | POA: Diagnosis not present

## 2016-06-02 DIAGNOSIS — D631 Anemia in chronic kidney disease: Secondary | ICD-10-CM | POA: Diagnosis not present

## 2016-06-02 DIAGNOSIS — N2581 Secondary hyperparathyroidism of renal origin: Secondary | ICD-10-CM | POA: Diagnosis not present

## 2016-06-05 DIAGNOSIS — E119 Type 2 diabetes mellitus without complications: Secondary | ICD-10-CM | POA: Diagnosis not present

## 2016-06-05 DIAGNOSIS — D631 Anemia in chronic kidney disease: Secondary | ICD-10-CM | POA: Diagnosis not present

## 2016-06-05 DIAGNOSIS — N186 End stage renal disease: Secondary | ICD-10-CM | POA: Diagnosis not present

## 2016-06-05 DIAGNOSIS — N2581 Secondary hyperparathyroidism of renal origin: Secondary | ICD-10-CM | POA: Diagnosis not present

## 2016-06-05 DIAGNOSIS — D509 Iron deficiency anemia, unspecified: Secondary | ICD-10-CM | POA: Diagnosis not present

## 2016-06-07 DIAGNOSIS — E119 Type 2 diabetes mellitus without complications: Secondary | ICD-10-CM | POA: Diagnosis not present

## 2016-06-07 DIAGNOSIS — D509 Iron deficiency anemia, unspecified: Secondary | ICD-10-CM | POA: Diagnosis not present

## 2016-06-07 DIAGNOSIS — N186 End stage renal disease: Secondary | ICD-10-CM | POA: Diagnosis not present

## 2016-06-07 DIAGNOSIS — N2581 Secondary hyperparathyroidism of renal origin: Secondary | ICD-10-CM | POA: Diagnosis not present

## 2016-06-07 DIAGNOSIS — D631 Anemia in chronic kidney disease: Secondary | ICD-10-CM | POA: Diagnosis not present

## 2016-06-09 ENCOUNTER — Ambulatory Visit (INDEPENDENT_AMBULATORY_CARE_PROVIDER_SITE_OTHER): Payer: Medicare Other | Admitting: Family

## 2016-06-09 ENCOUNTER — Ambulatory Visit (INDEPENDENT_AMBULATORY_CARE_PROVIDER_SITE_OTHER)
Admission: RE | Admit: 2016-06-09 | Discharge: 2016-06-09 | Disposition: A | Discharge status: Home / Self Care | Payer: Medicare Other | Source: Ambulatory Visit | Attending: Family | Admitting: Family

## 2016-06-09 ENCOUNTER — Ambulatory Visit (HOSPITAL_COMMUNITY)
Admission: RE | Admit: 2016-06-09 | Discharge: 2016-06-09 | Disposition: A | Discharge status: Home / Self Care | Payer: Medicare Other | Source: Ambulatory Visit | Attending: Family | Admitting: Family

## 2016-06-09 ENCOUNTER — Encounter: Payer: Self-pay | Admitting: Vascular Surgery

## 2016-06-09 ENCOUNTER — Other Ambulatory Visit: Payer: Self-pay | Admitting: Family

## 2016-06-09 ENCOUNTER — Other Ambulatory Visit: Payer: Self-pay

## 2016-06-09 ENCOUNTER — Ambulatory Visit (INDEPENDENT_AMBULATORY_CARE_PROVIDER_SITE_OTHER): Payer: Medicare Other | Admitting: Vascular Surgery

## 2016-06-09 ENCOUNTER — Encounter: Payer: Self-pay | Admitting: Family

## 2016-06-09 VITALS — BP 139/72 | HR 71 | Temp 96.6°F | Resp 18 | Ht 62.5 in | Wt 176.0 lb

## 2016-06-09 VITALS — BP 139/72 | HR 71 | Temp 96.9°F | Resp 18 | Ht 62.5 in | Wt 176.0 lb

## 2016-06-09 DIAGNOSIS — Z87891 Personal history of nicotine dependence: Secondary | ICD-10-CM

## 2016-06-09 DIAGNOSIS — N186 End stage renal disease: Secondary | ICD-10-CM | POA: Diagnosis not present

## 2016-06-09 DIAGNOSIS — I739 Peripheral vascular disease, unspecified: Secondary | ICD-10-CM

## 2016-06-09 DIAGNOSIS — Z992 Dependence on renal dialysis: Secondary | ICD-10-CM

## 2016-06-09 DIAGNOSIS — I70213 Atherosclerosis of native arteries of extremities with intermittent claudication, bilateral legs: Secondary | ICD-10-CM | POA: Diagnosis not present

## 2016-06-09 DIAGNOSIS — I6523 Occlusion and stenosis of bilateral carotid arteries: Secondary | ICD-10-CM

## 2016-06-09 DIAGNOSIS — I779 Disorder of arteries and arterioles, unspecified: Secondary | ICD-10-CM

## 2016-06-09 LAB — VAS US LOWER EXTREMITY ARTERIAL DUPLEX
RATIBDISTSYS: -14 cm/s
RPOPDPSV: -19 cm/s
RPOPPPSV: 29 cm/s
RSFDPSV: -11 cm/s
RTIBDISTSYS: 15 cm/s
Right peroneal sys PSV: 8 cm/s

## 2016-06-09 NOTE — Progress Notes (Signed)
Established Intermittent Claudication   History of Present Illness   [name] is a 71 y.o. ([birth year]) female who presents with chief complaint: short distance intermittent claudication.  The patient's symptoms have progressed.  The patient's symptoms are: intermittent claudication.  The patient's treatment regimen currently included: maximal medical management and walking plan.  The patient's PMH, PSH, and SH, and FamHx are unchanged from my NP's notes earlier today..  Current Outpatient Prescriptions  Medication Sig Dispense Refill  . acetaminophen (TYLENOL) 500 MG tablet Take 1,000 mg by mouth daily as needed for mild pain.     [name] albuterol (PROVENTIL HFA;VENTOLIN HFA) 108 (90 Base) MCG/ACT inhaler Inhale 1-2 puffs into the lungs every 6 (six) hours as needed for wheezing or shortness of breath. 1 Inhaler 5  . ambrisentan (LETAIRIS) 5 MG tablet TAKE 1 TABLET (5 MG) ORALLY DAILY. 30 tablet 5  . amLODipine (NORVASC) 10 MG tablet Take 10 mg by mouth at bedtime.     [name] aspirin EC 81 MG tablet Take 81 mg by mouth daily.    . carvedilol (COREG) 25 MG tablet Take 25 mg by mouth 2 (two) times daily with a meal.    . cilostazol (PLETAL) 100 MG tablet Take 1 tablet (100 mg total) by mouth 2 (two) times daily before a meal. 60 tablet 11  . diphenhydrAMINE (BENADRYL) 50 MG capsule Take 50 mg by mouth on 12/24/14, at 6:30 am. (Patient taking differently: Take 50 mg by mouth every Monday, Wednesday, and Friday. On dialysis days) 1 capsule 0  . hydrALAZINE (APRESOLINE) 50 MG tablet Take 50 mg by mouth 3 (three) times daily.    . hydrOXYzine (ATARAX/VISTARIL) 25 MG tablet Take 25 mg by mouth daily as needed for itching. Reported on 12/16/2014  6  . IOPHEN C-NR 100-10 MG/5ML syrup     . isosorbide mononitrate (IMDUR) 30 MG 24 hr tablet Take 1 tablet (30 mg total) by mouth daily. PLEASE  CONTACT OFFICE FOR ADDITIONAL REFILLS 15 tablet 0  . levETIRAcetam (KEPPRA) 500 MG tablet Take 500 mg by mouth  daily at 8 pm.    . levothyroxine (SYNTHROID, LEVOTHROID) 88 MCG tablet Take 88 mcg by mouth daily before breakfast.    . multivitamin (RENA-VIT) TABS tablet Take 1 tablet by mouth daily.    . nitroGLYCERIN (NITROSTAT) 0.4 MG SL tablet Place 1 tablet (0.4 mg total) under the tongue every 5 (five) minutes as needed for chest pain. 30 tablet 0  . omeprazole (PRILOSEC OTC) 20 MG tablet Take 20 mg by mouth daily.    . pravastatin (PRAVACHOL) 40 MG tablet Take 1 tablet (40 mg total) by mouth at bedtime. 90 tablet 3  . SENSIPAR 90 MG tablet Take 90 mg by mouth at bedtime.     . sevelamer carbonate (RENVELA) 800 MG tablet Take 1,600-3,200 mg by mouth 3 (three) times daily. 4 tabs TID, 2 tabs with each snack    . traMADol (ULTRAM) 50 MG tablet Take 50 mg by mouth daily as needed for moderate pain.     . traZODone (DESYREL) 50 MG tablet Take 50 mg by mouth at bedtime as needed for sleep.      No current facility-administered medications for this visit.     On ROS today: no rest pain, no gangrene or ulcers.   Physical Examination   Vitals:   06/09/16 1150  BP: 139/72  Pulse: 71  Resp: 18  Temp: (!) 96.9 F (36.1 C)  TempSrc: Oral  SpO2: 98%  Weight: 176 lb (79.8 kg)  Height: 5' 2.5" (1.588 m)   Body mass index is 31.68 kg/m.  General Alert, O x 3, WD, NAD  Pulmonary Sym exp, good B air movt, CTA B  Cardiac RRR, Nl S1, S2, no Murmurs, No rubs, No S3,S4  Vascular Vessel Right Left  Radial Palpable Palpable  Brachial Palpable Palpable  Carotid Palpable, No Bruit Palpable, No Bruit  Aorta Not palpable N/A  Femoral Palpable Palpable  Popliteal Not palpable Not palpable  PT Not palpable Not palpable  DP Not palpable Not palpable    Gastro- intestinal soft, non-distended, non-tender to palpation, No guarding or rebound, no HSM, no masses, no CVAT B, No palpable prominent aortic pulse,    Musculo- skeletal M/S 5/5 throughout  , Extremities without ischemic changes  , No edema present,  No visible varicosities , No Lipodermatosclerosis present, occluded RUA AVG, patent LUA AVG with some pseudoaneurysmal changes  Neurologic Pain and light touch intact in extremities , Motor exam as listed above    Non-Invasive Vascular imaging   ABI (06/09/2016)  R:   ABI: [name],   PT: mono  DP: mono  TBI:  0.18  L:   ABI: [name],   PT: mono  DP: mono  TBI: 0.34  LLE Arterial Duplex (06/09/2016)  Flush occlusion of L SFA with distal reconstitution  Patent tibials distally   Medical Decision Making   [name] is a 71 y.o. female who presents with:  bilateral (L>R) leg short distance intermittent claudication without evidence of critical limb ischemia.   Based on the patient's vascular studies and examination, I have offered the patient: Aortogram, bilateral leg runoff to determine the target for a L fem-pop bypass.    The patient is scheduled for 14 JUN 18 for these studies.  Pt will need preop risk stratification and optimization with Cardiology.  Additionally, she will need B GSV mapping.  Reportedly R GSV has already been harvested.  Once all of these studies are completed, will discuss bypass options.  I discussed in depth with the patient the nature of atherosclerosis, and emphasized the importance of maximal medical management including strict control of blood pressure, blood glucose, and lipid levels, antiplatelet agents, obtaining regular exercise, and cessation of smoking.    The patient is aware that without maximal medical management the underlying atherosclerotic disease process will progress, limiting the benefit of any interventions. The patient is currently on a statin: Pravachol.  The patient is currently on an anti-platelet: ASA.  Pt is also on Pletal.  Thank you for allowing [place] to participate in this patient's care.   Adele Barthel, MD, FACS [facility] [place] Office: [phone] Pager: [phone]

## 2016-06-09 NOTE — Addendum Note (Signed)
Addended by: Lianne Cure A on: 06/09/2016 01:17 PM   Modules accepted: Orders

## 2016-06-09 NOTE — Patient Instructions (Signed)

## 2016-06-09 NOTE — Progress Notes (Signed)
Vitals:   06/09/16 0854  BP: (!) 165/70  Pulse: 71  Resp: 18  Temp: (!) 96.6 F (35.9 C)  SpO2: 98%  Weight: 176 lb (79.8 kg)  Height: 5' 2.5" (1.588 m)

## 2016-06-09 NOTE — Progress Notes (Signed)
[facility] [place]   CC: Follow up peripheral artery occlusive disease  History of Present Illness [name] is a 71 y.o. female patient of Dr. Bridgett Larsson who is s/p arteriogram with bilateral run offon 12/31/14 . The patient denies rest pain at this point.   She returns today with c/o worsening left calf pain that suddenly started 2 weeks ago, occurs with walking about 5 feet, relieved by rest. She did resume the Pletal about 2-3 weeks ago as advised. She also c/o pain in her left upper arm access for about 1 1/2 weeks, only hurts while on HD.  She reports a recent procedure at [facility] in April 2018 that seemed to be for stenosis of her HD access, states this improved her access.  She states she is frightened as she saw her twin sister go through the same sx's and she died.  I last evaluated her on 05-12-16.   She denies any known problems with arthritis or back problems.  AV graft HD access is in her left upper arm, MWF are HD days. She denies any steal sx's in her left upper extremity.   She denies non healing wounds in her LE's.  She reports a stroke, MI, and seizures about 2005, started HD at that time.  Pt states her long term use of ibuprofen caused her kidney failure.   The patient denies New Medical or Surgical History.  Pt Diabetic: No Pt smoker: former smoker, quit in December 2017(started at age 74 yrs)  Pt meds include: Statin :Yes Betablocker: Yes ASA: Yes Other anticoagulants/antiplatelets: no     Past Medical History:  Diagnosis Date  . Arthritis of shoulder    Bilateral  . CAD (coronary artery disease)    stent to RCA  . Cancer ([place])    clear cell cancer, kidney  . Chronic renal insufficiency    On hemodialysis  . Complication of anesthesia 12/2010   pt is very confused, with AMS with anesthesia  . COPD (chronic obstructive pulmonary disease) ([facility]) t  . CVA 07/27/2008   CVA affected cognition and memory per family,  no focal deficits.    . CVA (cerebral infarction) 2003   no apparent residual  . Depression   . Diastolic congestive heart failure ([place])   . Dyslipidemia   . Encephalopathy   . ESRD 07/27/2008   ESRD due to HTN and NSAID's, started hemodialysis in 2005 in [place], [place]. Went to [facility] from 2010 to 2012 and since 2012 has been getting dialysis at [facility] on [address] in [place] on a MWF schedule. First access with RUA AVG placed in [place]. Next and current access was LUA AVG placed by Dr. Lucky Cowboy in [place] in or around 2012. Has had 2 or 3 procedures on that graft since placed per family. She gets her access work done here in [place] now. She is allergic to heparin and does not get any heparin at dialysis; she had an allergic reaction apparently when in ICU in the past.      . Hyperlipidemia   . Hypertension   . Hypothyroidism   . Positive PPD    completed rifampin  . PVD (peripheral vascular disease) ([place])   . Seizures [facility])     Social History Social History  Substance Use Topics  . Smoking status: Former Smoker    Packs/day: 0.50    Years: 53.00    Types: Cigarettes    Quit date: 12/17/2015  . Smokeless  tobacco: Never Used     Comment: down to .25ppd  . Alcohol use 0.0 oz/week     Comment: very rarely per pt    Family History Family History  Problem Relation Age of Onset  . Heart disease Father   . Hypertension Mother   . Dementia Mother   . Coronary artery disease Sister   . Heart attack Sister   . Hypertension Brother     Past Surgical History:  Procedure Laterality Date  . ABDOMINAL HYSTERECTOMY    . CARDIAC CATHETERIZATION    . CATARACT EXTRACTION Bilateral   . CHOLECYSTECTOMY    . D&Cs    . LEFT HEART CATHETERIZATION WITH CORONARY ANGIOGRAM N/A 02/22/2011   Procedure: LEFT HEART CATHETERIZATION WITH CORONARY ANGIOGRAM;  Surgeon: Wellington Hampshire, MD;  Location: [facility];  Service: Cardiovascular;  Laterality: N/A;  . left  nephrectomy    . NEPHRECTOMY Left    Malignant tumor  . PERIPHERAL VASCULAR CATHETERIZATION N/A 12/31/2014   Procedure: Abdominal Aortogram;  Surgeon: Conrad Cairo, MD;  Location: [facility] CV LAB;  Service: Cardiovascular;  Laterality: N/A;  . right ankle repair    . RIGHT HEART CATHETERIZATION N/A 01/29/2014   Procedure: RIGHT HEART CATH;  Surgeon: Larey Dresser, MD;  Location: [facility] CATH LAB;  Service: Cardiovascular;  Laterality: N/A;  . TONSILLECTOMY    . VASCULAR SURGERY  11/2010   graft inserted to left arm    Allergies  Allergen Reactions  . Ace Inhibitors Anaphylaxis and Rash  . Heparin Other (See Comments)    MDs told her not to take after reaction in ICU  . Iohexol Swelling and Other (See Comments)    1970s; passed out and had facial/tongue swelling.  Requires 13-hour prep with prednisone and benadryl  . Phenytoin Other (See Comments)    Had reaction while in ICU; doesn't know.  MDs told her not to take ever again.  [name] Phenytoin Sodium Extended Other (See Comments)  . Wellbutrin [Bupropion] Other (See Comments)    seizures  . Ampicillin Hives and Rash  . Meperidine Hcl Swelling and Rash    Makes tongue swell  . Morphine Rash  . Penicillins Rash    Has patient had a PCN reaction causing immediate rash, facial/tongue/throat swelling, SOB or lightheadedness with hypotension: Yes Has patient had a PCN reaction causing severe rash involving mucus membranes or skin necrosis: No Has patient had a PCN reaction that required hospitalization No Has patient had a PCN reaction occurring within the last 10 years: No If all of the above answers are "NO", then may proceed with Cephalosporin use.    [name] Pentazocine Rash  . Valproic Acid And Related Other (See Comments)    Confusion   . Iodinated Diagnostic Agents Other (See Comments)    Pre-meditate with benadryl and prednisone 3 times before appt.  . Pentazocine Lactate Other (See Comments)    Patient does not remember reaction  to this med ([place]).     Current Outpatient Prescriptions  Medication Sig Dispense Refill  . acetaminophen (TYLENOL) 500 MG tablet Take 1,000 mg by mouth daily as needed for mild pain.     [name] albuterol (PROVENTIL HFA;VENTOLIN HFA) 108 (90 Base) MCG/ACT inhaler Inhale 1-2 puffs into the lungs every 6 (six) hours as needed for wheezing or shortness of breath. 1 Inhaler 5  . ambrisentan (LETAIRIS) 5 MG tablet TAKE 1 TABLET (5 MG) ORALLY DAILY. 30 tablet 5  . amLODipine (NORVASC) 10 MG tablet  Take 10 mg by mouth at bedtime.     [name] aspirin EC 81 MG tablet Take 81 mg by mouth daily.    . carvedilol (COREG) 25 MG tablet Take 25 mg by mouth 2 (two) times daily with a meal.    . cilostazol (PLETAL) 100 MG tablet Take 1 tablet (100 mg total) by mouth 2 (two) times daily before a meal. 60 tablet 11  . diphenhydrAMINE (BENADRYL) 50 MG capsule Take 50 mg by mouth on 12/24/14, at 6:30 am. (Patient taking differently: Take 50 mg by mouth every Monday, Wednesday, and Friday. On dialysis days) 1 capsule 0  . hydrALAZINE (APRESOLINE) 50 MG tablet Take 50 mg by mouth 3 (three) times daily.    . hydrOXYzine (ATARAX/VISTARIL) 25 MG tablet Take 25 mg by mouth daily as needed for itching. Reported on 12/16/2014  6  . IOPHEN C-NR 100-10 MG/5ML syrup     . isosorbide mononitrate (IMDUR) 30 MG 24 hr tablet Take 1 tablet (30 mg total) by mouth daily. PLEASE  CONTACT OFFICE FOR ADDITIONAL REFILLS 15 tablet 0  . levETIRAcetam (KEPPRA) 500 MG tablet Take 500 mg by mouth daily at 8 pm.    . levothyroxine (SYNTHROID, LEVOTHROID) 88 MCG tablet Take 88 mcg by mouth daily before breakfast.    . multivitamin (RENA-VIT) TABS tablet Take 1 tablet by mouth daily.    . nitroGLYCERIN (NITROSTAT) 0.4 MG SL tablet Place 1 tablet (0.4 mg total) under the tongue every 5 (five) minutes as needed for chest pain. 30 tablet 0  . omeprazole (PRILOSEC OTC) 20 MG tablet Take 20 mg by mouth daily.    . pravastatin (PRAVACHOL) 40 MG tablet Take 1  tablet (40 mg total) by mouth at bedtime. 90 tablet 3  . SENSIPAR 90 MG tablet Take 90 mg by mouth at bedtime.     . sevelamer carbonate (RENVELA) 800 MG tablet Take 1,600-3,200 mg by mouth 3 (three) times daily. 4 tabs TID, 2 tabs with each snack    . traMADol (ULTRAM) 50 MG tablet Take 50 mg by mouth daily as needed for moderate pain.     . traZODone (DESYREL) 50 MG tablet Take 50 mg by mouth at bedtime as needed for sleep.      No current facility-administered medications for this visit.     ROS: See HPI for pertinent positives and negatives.   Physical Examination  Vitals:   06/09/16 0854 06/09/16 0857  BP: (!) 165/70 139/72  Pulse: 71 71  Resp: 18   Temp: (!) 96.6 F (35.9 C)   SpO2: 98%   Weight: 176 lb (79.8 kg)   Height: 5' 2.5" (1.588 m)    Body mass index is 31.68 kg/m.  General: A&O x 3, WDWN obese female  Pulmonary: Sym exp, respirations are non labored, fair air movt except diminished in both bases,no rhonchi, rales,or wheezing.   Cardiac: RRR, Nl S1, S2, + murmur. Left upper arm AV fistula with palpable thrill.   Vascular: Vessel Right Left  Radial Palpable Not Palpable  Brachial Palpable Palpable  Carotid Palpable, without bruit Palpable, with bruit  Aorta Not palpable N/A  Femoral Palpable Palpable  Popliteal Not palpable Not palpable  PT Not Palpable, + Doppler signal Not Palpable, + Doppler signal  DP Not Palpable, + Doppler signal Not Palpable, + Doppler signal   Gastrointestinal: soft, NTND, no G/R, no HSM, no palpable masses, no CVAT B, mid-line incision healed  Musculoskeletal: M/S 5/5 throughout , Extremities without ischemic  changes , R medial thigh incision healed. No peripheral edema. Left toes and forefoot are slightly cooler than right, no ulcers, no gangrene, no signs of ischemia in feet or legs.   Neurologic: CN 2-12 grossly intact, Pain and light touch intact in extremities  except decreased in feet, Motor exam as above. Straight leg raise against resistance elicits moderate pain in upper anterior thighs of both legs, does not elicit buttock or low back pain.         ASSESSMENT: FALLYNN GRAVETT is a 71 y.o. female who presents with: a history of bilateral LE stents [place] about 2002 [place] another city. Bilateral SFA are occluded with extensive collateralization via the PFA according to angiogram in January 2017. She did not want bypass surgery in her legs; she will try maximal medical management.  She has severe claudication in her left calf for the last 2 weeks.  She has no signs of ischemia in her feet/legs. She stopped smoking in December 2017.   According to Dr. Rosezella Florida office visit note from 09-25-13, pt has CHF; therefore cilostazol is contraindicated for use to try to alleviate her claudication symptoms, but Dr. Percival Spanish advised resuming cilostizol since pt c/o worsening leg pain at her visit with him on 05-23-16  She had a CVA in 2005.  DATA  ABI's (05/12/16): Right: 0.92 (0.70 on 11-11-15), waveforms: monophasic; TBI: dampened (was 0.31 on 11-11-15) Left: [place] and 0.40 (was 0.76 on 11-11-15), waveforms: monophasic; TBI: 0.18 (was 0.32 on 11-11-15) Unreliable ABI's due to lack of correlation between ankle pressures and Doppler waveforms which is most likley a result of medial calcification.    Carotid duplex on 11-11-15 suggests irregular calcific plaque in the bilateral ICA and ECA, <40%bilateral ICA stenosis. Bilateral vertebral artery flow is antegrade. Bilateral subclavian artery waveforms are normal. No previous carotid duplex exams for comparison.    Dr. Bridgett Larsson saw pt on 01/29/15. At that time the angiogram demonstrated bilateral occluded stents with occluded SFA with extensive collateralization via the PFA. She has brisk refilling of her AK popliteal arteries.  Based on her angiographic findings, this patient needs: no immediate  intervention thus she might be a candidate for B fem-AK pop bypasses.  She does NOT want bypasses at this point and is making an attempt at maximal medical mgmt.  She knows she needs to quit smoking.   The patient is currently on a statin: Pravachol.                   The patient is currently on an anti-platelet: ASA.    PLAN:  Graduated walking program discussed and how to achieve. Daily seated leg exercises discussed and demonstrated.   Based on the patient's vascular studies and examination, and after discussing with Dr. Bridgett Larsson, pt will return to clinic on a Tuesday or Thursday (a non HD day), ASAPwith ABI's and left LE arterial duplex;  carotid duplex at another time, see Dr. Bridgett Larsson afterward.  Pt was able to be fit in today for testing, see Dr. Bridgett Larsson afterward.   I advised her to notify us if she develops concerns re the circulation in her feet or legs.   I discussed in depth with the patient the nature of atherosclerosis, and emphasized the importance of maximal medical management including strict control of blood pressure, blood glucose, and lipid levels, obtaining regular exercise, and continued cessation of smoking.  The patient is aware that without maximal medical management the underlying atherosclerotic disease process will progress, limiting  the benefit of any interventions.  The patient was given information about PAD including signs, symptoms, treatment, what symptoms should prompt the patient to seek immediate medical care, and risk reduction measures to take.  Clemon Chambers, RN, MSN, FNP-C [facility] Phone: [phone]  Clinic MD: Bridgett Larsson  06/09/16 9:18 AM

## 2016-06-10 DIAGNOSIS — E119 Type 2 diabetes mellitus without complications: Secondary | ICD-10-CM | POA: Diagnosis not present

## 2016-06-10 DIAGNOSIS — N186 End stage renal disease: Secondary | ICD-10-CM | POA: Diagnosis not present

## 2016-06-10 DIAGNOSIS — D631 Anemia in chronic kidney disease: Secondary | ICD-10-CM | POA: Diagnosis not present

## 2016-06-10 DIAGNOSIS — N2581 Secondary hyperparathyroidism of renal origin: Secondary | ICD-10-CM | POA: Diagnosis not present

## 2016-06-10 DIAGNOSIS — D509 Iron deficiency anemia, unspecified: Secondary | ICD-10-CM | POA: Diagnosis not present

## 2016-06-12 ENCOUNTER — Other Ambulatory Visit: Payer: Self-pay | Admitting: Pulmonary Disease

## 2016-06-12 DIAGNOSIS — E119 Type 2 diabetes mellitus without complications: Secondary | ICD-10-CM | POA: Diagnosis not present

## 2016-06-12 DIAGNOSIS — D509 Iron deficiency anemia, unspecified: Secondary | ICD-10-CM | POA: Diagnosis not present

## 2016-06-12 DIAGNOSIS — D631 Anemia in chronic kidney disease: Secondary | ICD-10-CM | POA: Diagnosis not present

## 2016-06-12 DIAGNOSIS — N186 End stage renal disease: Secondary | ICD-10-CM | POA: Diagnosis not present

## 2016-06-12 DIAGNOSIS — N2581 Secondary hyperparathyroidism of renal origin: Secondary | ICD-10-CM | POA: Diagnosis not present

## 2016-06-13 ENCOUNTER — Telehealth: Payer: Self-pay | Admitting: *Deleted

## 2016-06-13 ENCOUNTER — Ambulatory Visit (HOSPITAL_COMMUNITY)
Admission: RE | Admit: 2016-06-13 | Discharge: 2016-06-13 | Disposition: A | Discharge status: Home / Self Care | Payer: Medicare Other | Source: Ambulatory Visit | Attending: Vascular Surgery | Admitting: Vascular Surgery

## 2016-06-13 DIAGNOSIS — Z992 Dependence on renal dialysis: Secondary | ICD-10-CM | POA: Diagnosis not present

## 2016-06-13 DIAGNOSIS — N186 End stage renal disease: Secondary | ICD-10-CM

## 2016-06-13 NOTE — Telephone Encounter (Signed)
Requesting surgical clearance:   1. Type of surgery: Leg Bypass  2. Surgeon: Adele Barthel  3. Surgical date: 06/15/2016  4. Medications that need to be help: Aspirin  5. [facility]: (P) [phone] (F) [phone]   Pt saw you last on 05/22, is pt cleared for surgery?

## 2016-06-14 DIAGNOSIS — N2581 Secondary hyperparathyroidism of renal origin: Secondary | ICD-10-CM | POA: Diagnosis not present

## 2016-06-14 DIAGNOSIS — D509 Iron deficiency anemia, unspecified: Secondary | ICD-10-CM | POA: Diagnosis not present

## 2016-06-14 DIAGNOSIS — N186 End stage renal disease: Secondary | ICD-10-CM | POA: Diagnosis not present

## 2016-06-14 DIAGNOSIS — E119 Type 2 diabetes mellitus without complications: Secondary | ICD-10-CM | POA: Diagnosis not present

## 2016-06-14 DIAGNOSIS — D631 Anemia in chronic kidney disease: Secondary | ICD-10-CM | POA: Diagnosis not present

## 2016-06-14 NOTE — Telephone Encounter (Signed)
Just received this request yesterday.  OK to hold ASA .

## 2016-06-15 ENCOUNTER — Ambulatory Visit (HOSPITAL_COMMUNITY)
Admission: RE | Admit: 2016-06-15 | Discharge: 2016-06-15 | Disposition: A | Discharge status: Home / Self Care | Payer: Medicare Other | Source: Ambulatory Visit | Attending: Vascular Surgery | Admitting: Vascular Surgery

## 2016-06-15 ENCOUNTER — Encounter (HOSPITAL_COMMUNITY)
Admission: RE | Disposition: A | Discharge status: Home / Self Care | Payer: Self-pay | Source: Ambulatory Visit | Attending: Vascular Surgery

## 2016-06-15 DIAGNOSIS — I70213 Atherosclerosis of native arteries of extremities with intermittent claudication, bilateral legs: Secondary | ICD-10-CM | POA: Diagnosis not present

## 2016-06-15 DIAGNOSIS — Z79899 Other long term (current) drug therapy: Secondary | ICD-10-CM | POA: Insufficient documentation

## 2016-06-15 DIAGNOSIS — I70219 Atherosclerosis of native arteries of extremities with intermittent claudication, unspecified extremity: Secondary | ICD-10-CM | POA: Diagnosis present

## 2016-06-15 DIAGNOSIS — Q278 Other specified congenital malformations of peripheral vascular system: Secondary | ICD-10-CM | POA: Insufficient documentation

## 2016-06-15 DIAGNOSIS — Z7982 Long term (current) use of aspirin: Secondary | ICD-10-CM | POA: Diagnosis not present

## 2016-06-15 DIAGNOSIS — Y713 Surgical instruments, materials and cardiovascular devices (including sutures) associated with adverse incidents: Secondary | ICD-10-CM | POA: Insufficient documentation

## 2016-06-15 DIAGNOSIS — T82856A Stenosis of peripheral vascular stent, initial encounter: Secondary | ICD-10-CM | POA: Insufficient documentation

## 2016-06-15 HISTORY — PX: ABDOMINAL AORTOGRAM W/LOWER EXTREMITY: CATH118223

## 2016-06-15 LAB — POCT I-STAT, CHEM 8
BUN: 26 mg/dL — AB (ref 6–20)
CALCIUM ION: 1.03 mmol/L — AB (ref 1.15–1.40)
CHLORIDE: 94 mmol/L — AB (ref 101–111)
Creatinine, Ser: 6.4 mg/dL — ABNORMAL HIGH (ref 0.44–1.00)
GLUCOSE: 74 mg/dL (ref 65–99)
HCT: 38 % (ref 36.0–46.0)
Hemoglobin: 12.9 g/dL (ref 12.0–15.0)
Potassium: 4.5 mmol/L (ref 3.5–5.1)
SODIUM: 136 mmol/L (ref 135–145)
TCO2: 33 mmol/L (ref 0–100)

## 2016-06-15 SURGERY — ABDOMINAL AORTOGRAM W/LOWER EXTREMITY
Anesthesia: LOCAL | Laterality: Bilateral

## 2016-06-15 MED ORDER — IODIXANOL 320 MG/ML IV SOLN
INTRAVENOUS | Status: DC | PRN
  Administered 2016-06-15: 95 mL via INTRAVENOUS

## 2016-06-15 MED ORDER — SODIUM CHLORIDE 0.9 % IV SOLN
INTRAVENOUS | Status: DC
  Administered 2016-06-15: 09:00:00 via INTRAVENOUS

## 2016-06-15 MED ORDER — LIDOCAINE HCL 1 % IJ SOLN
INTRAMUSCULAR | Status: AC
  Filled 2016-06-15: qty 20

## 2016-06-15 MED ORDER — FENTANYL CITRATE (PF) 100 MCG/2ML IJ SOLN
INTRAMUSCULAR | Status: DC | PRN
  Administered 2016-06-15 (×2): 50 ug via INTRAVENOUS

## 2016-06-15 MED ORDER — FAMOTIDINE IN NACL 20-0.9 MG/50ML-% IV SOLN
20.0000 mg | INTRAVENOUS | Status: AC
  Administered 2016-06-15: 20 mg via INTRAVENOUS

## 2016-06-15 MED ORDER — METHYLPREDNISOLONE SODIUM SUCC 125 MG IJ SOLR
125.0000 mg | INTRAMUSCULAR | Status: AC
  Administered 2016-06-15: 125 mg via INTRAVENOUS

## 2016-06-15 MED ORDER — DIPHENHYDRAMINE HCL 50 MG/ML IJ SOLN
25.0000 mg | INTRAMUSCULAR | Status: AC
  Administered 2016-06-15: 25 mg via INTRAVENOUS

## 2016-06-15 MED ORDER — HEPARIN (PORCINE) IN NACL 2-0.9 UNIT/ML-% IJ SOLN
INTRAMUSCULAR | Status: AC
  Filled 2016-06-15: qty 1000

## 2016-06-15 MED ORDER — MIDAZOLAM HCL 2 MG/2ML IJ SOLN
INTRAMUSCULAR | Status: AC
  Filled 2016-06-15: qty 2

## 2016-06-15 MED ORDER — DIPHENHYDRAMINE HCL 50 MG/ML IJ SOLN
INTRAMUSCULAR | Status: AC
  Administered 2016-06-15: 25 mg via INTRAVENOUS
  Filled 2016-06-15: qty 1

## 2016-06-15 MED ORDER — HYDRALAZINE HCL 20 MG/ML IJ SOLN: 10.0000 mg | INTRAMUSCULAR | Status: DC | PRN

## 2016-06-15 MED ORDER — SODIUM CHLORIDE 0.9% FLUSH: 3.0000 mL | Freq: Two times a day (BID) | INTRAVENOUS | Status: DC

## 2016-06-15 MED ORDER — SODIUM CHLORIDE 0.9% FLUSH: 3.0000 mL | INTRAVENOUS | Status: DC | PRN

## 2016-06-15 MED ORDER — HEPARIN (PORCINE) IN NACL 2-0.9 UNIT/ML-% IJ SOLN: INTRAMUSCULAR | Status: DC | PRN

## 2016-06-15 MED ORDER — FAMOTIDINE IN NACL 20-0.9 MG/50ML-% IV SOLN
INTRAVENOUS | Status: AC
  Administered 2016-06-15: 20 mg via INTRAVENOUS
  Filled 2016-06-15: qty 50

## 2016-06-15 MED ORDER — METHYLPREDNISOLONE SODIUM SUCC 125 MG IJ SOLR
INTRAMUSCULAR | Status: AC
  Administered 2016-06-15: 125 mg via INTRAVENOUS
  Filled 2016-06-15: qty 2

## 2016-06-15 MED ORDER — SODIUM CHLORIDE 0.9 % IV SOLN: 250.0000 mL | INTRAVENOUS | Status: DC | PRN

## 2016-06-15 MED ORDER — LIDOCAINE HCL (PF) 1 % IJ SOLN
INTRAMUSCULAR | Status: DC | PRN
  Administered 2016-06-15: 12 mL

## 2016-06-15 MED ORDER — FENTANYL CITRATE (PF) 100 MCG/2ML IJ SOLN
INTRAMUSCULAR | Status: AC
  Filled 2016-06-15: qty 2

## 2016-06-15 MED ORDER — OXYCODONE-ACETAMINOPHEN 5-325 MG PO TABS: 1.0000 | ORAL_TABLET | Freq: Four times a day (QID) | ORAL | Status: DC | PRN

## 2016-06-15 MED ORDER — MIDAZOLAM HCL 2 MG/2ML IJ SOLN
INTRAMUSCULAR | Status: DC | PRN
  Administered 2016-06-15: 1 mg via INTRAVENOUS

## 2016-06-15 MED ORDER — ACETAMINOPHEN 325 MG PO TABS
650.0000 mg | ORAL_TABLET | ORAL | Status: DC | PRN
  Administered 2016-06-15: 650 mg via ORAL

## 2016-06-15 MED ORDER — ACETAMINOPHEN 325 MG PO TABS
ORAL_TABLET | ORAL | Status: AC
  Administered 2016-06-15: 650 mg via ORAL
  Filled 2016-06-15: qty 2

## 2016-06-15 SURGICAL SUPPLY — 13 items
CATH OMNI FLUSH 5F 65CM (CATHETERS) ×2 IMPLANT
CATH STRAIGHT 5FR 65CM (CATHETERS) ×2 IMPLANT
COVER PRB 48X5XTLSCP FOLD TPE (BAG) ×1 IMPLANT
COVER PROBE 5X48 (BAG) ×1
GUIDEWIRE ANGLED .035X150CM (WIRE) ×2 IMPLANT
KIT MICROINTRODUCER STIFF 5F (SHEATH) ×2 IMPLANT
KIT PV (KITS) ×2 IMPLANT
SHEATH PINNACLE 5F 10CM (SHEATH) ×2 IMPLANT
SYR MEDRAD MARK V 150ML (SYRINGE) ×2 IMPLANT
TRANSDUCER W/STOPCOCK (MISCELLANEOUS) ×2 IMPLANT
TRAY PV CATH (CUSTOM PROCEDURE TRAY) ×2 IMPLANT
WIRE BENTSON .035X145CM (WIRE) ×2 IMPLANT
WIRE TORQFLEX AUST .018X40CM (WIRE) ×2 IMPLANT

## 2016-06-15 NOTE — H&P (View-Only) (Signed)
Established Intermittent Claudication   History of Present Illness   [name] is a 71 y.o. (October 11, 1945) female who presents with chief complaint: short distance intermittent claudication.  The patient's symptoms have progressed.  The patient's symptoms are: intermittent claudication.  The patient's treatment regimen currently included: maximal medical management and walking plan.  The patient's PMH, PSH, and SH, and FamHx are unchanged from my NP's notes earlier today..  Current Outpatient Prescriptions  Medication Sig Dispense Refill  . acetaminophen (TYLENOL) 500 MG tablet Take 1,000 mg by mouth daily as needed for mild pain.     [name] albuterol (PROVENTIL HFA;VENTOLIN HFA) 108 (90 Base) MCG/ACT inhaler Inhale 1-2 puffs into the lungs every 6 (six) hours as needed for wheezing or shortness of breath. 1 Inhaler 5  . ambrisentan (LETAIRIS) 5 MG tablet TAKE 1 TABLET (5 MG) ORALLY DAILY. 30 tablet 5  . amLODipine (NORVASC) 10 MG tablet Take 10 mg by mouth at bedtime.     [name] aspirin EC 81 MG tablet Take 81 mg by mouth daily.    . carvedilol (COREG) 25 MG tablet Take 25 mg by mouth 2 (two) times daily with a meal.    . cilostazol (PLETAL) 100 MG tablet Take 1 tablet (100 mg total) by mouth 2 (two) times daily before a meal. 60 tablet 11  . diphenhydrAMINE (BENADRYL) 50 MG capsule Take 50 mg by mouth on 12/24/14, at 6:30 am. (Patient taking differently: Take 50 mg by mouth every Monday, Wednesday, and Friday. On dialysis days) 1 capsule 0  . hydrALAZINE (APRESOLINE) 50 MG tablet Take 50 mg by mouth 3 (three) times daily.    . hydrOXYzine (ATARAX/VISTARIL) 25 MG tablet Take 25 mg by mouth daily as needed for itching. Reported on 12/16/2014  6  . IOPHEN C-NR 100-10 MG/5ML syrup     . isosorbide mononitrate (IMDUR) 30 MG 24 hr tablet Take 1 tablet (30 mg total) by mouth daily. PLEASE  CONTACT OFFICE FOR ADDITIONAL REFILLS 15 tablet 0  . levETIRAcetam (KEPPRA) 500 MG tablet Take 500 mg by mouth  daily at 8 pm.    . levothyroxine (SYNTHROID, LEVOTHROID) 88 MCG tablet Take 88 mcg by mouth daily before breakfast.    . multivitamin (RENA-VIT) TABS tablet Take 1 tablet by mouth daily.    . nitroGLYCERIN (NITROSTAT) 0.4 MG SL tablet Place 1 tablet (0.4 mg total) under the tongue every 5 (five) minutes as needed for chest pain. 30 tablet 0  . omeprazole (PRILOSEC OTC) 20 MG tablet Take 20 mg by mouth daily.    . pravastatin (PRAVACHOL) 40 MG tablet Take 1 tablet (40 mg total) by mouth at bedtime. 90 tablet 3  . SENSIPAR 90 MG tablet Take 90 mg by mouth at bedtime.     . sevelamer carbonate (RENVELA) 800 MG tablet Take 1,600-3,200 mg by mouth 3 (three) times daily. 4 tabs TID, 2 tabs with each snack    . traMADol (ULTRAM) 50 MG tablet Take 50 mg by mouth daily as needed for moderate pain.     . traZODone (DESYREL) 50 MG tablet Take 50 mg by mouth at bedtime as needed for sleep.      No current facility-administered medications for this visit.     On ROS today: no rest pain, no gangrene or ulcers.   Physical Examination   Vitals:   06/09/16 1150  BP: 139/72  Pulse: 71  Resp: 18  Temp: (!) 96.9 F (36.1 C)  TempSrc: Oral  SpO2: 98%  Weight: 176 lb (79.8 kg)  Height: 5' 2.5" (1.588 m)   Body mass index is 31.68 kg/m.  General Alert, O x 3, WD, NAD  Pulmonary Sym exp, good B air movt, CTA B  Cardiac RRR, Nl S1, S2, no Murmurs, No rubs, No S3,S4  Vascular Vessel Right Left  Radial Palpable Palpable  Brachial Palpable Palpable  Carotid Palpable, No Bruit Palpable, No Bruit  Aorta Not palpable N/A  Femoral Palpable Palpable  Popliteal Not palpable Not palpable  PT Not palpable Not palpable  DP Not palpable Not palpable    Gastro- intestinal soft, non-distended, non-tender to palpation, No guarding or rebound, no HSM, no masses, no CVAT B, No palpable prominent aortic pulse,    Musculo- skeletal M/S 5/5 throughout  , Extremities without ischemic changes  , No edema present,  No visible varicosities , No Lipodermatosclerosis present, occluded RUA AVG, patent LUA AVG with some pseudoaneurysmal changes  Neurologic Pain and light touch intact in extremities , Motor exam as listed above    Non-Invasive Vascular imaging   ABI (06/09/2016)  R:   ABI: [place],   PT: mono  DP: mono  TBI:  0.18  L:   ABI: [place],   PT: mono  DP: mono  TBI: 0.34  LLE Arterial Duplex (06/09/2016)  Flush occlusion of L SFA with distal reconstitution  Patent tibials distally   Medical Decision Making   [name] is a 71 y.o. female who presents with:  bilateral (L>R) leg short distance intermittent claudication without evidence of critical limb ischemia.   Based on the patient's vascular studies and examination, I have offered the patient: Aortogram, bilateral leg runoff to determine the target for a L fem-pop bypass.    The patient is scheduled for 14 JUN 18 for these studies.  Pt will need preop risk stratification and optimization with Cardiology.  Additionally, she will need B GSV mapping.  Reportedly R GSV has already been harvested.  Once all of these studies are completed, will discuss bypass options.  I discussed in depth with the patient the nature of atherosclerosis, and emphasized the importance of maximal medical management including strict control of blood pressure, blood glucose, and lipid levels, antiplatelet agents, obtaining regular exercise, and cessation of smoking.    The patient is aware that without maximal medical management the underlying atherosclerotic disease process will progress, limiting the benefit of any interventions. The patient is currently on a statin: Pravachol.  The patient is currently on an anti-platelet: ASA.  Pt is also on Pletal.  Thank you for allowing [place] to participate in this patient's care.   Adele Barthel, MD, FACS [facility] [place] Office: [phone] Pager: [phone]

## 2016-06-15 NOTE — Telephone Encounter (Signed)
Result faxed via Epic to [facility] specialist

## 2016-06-15 NOTE — Interval H&P Note (Signed)
   History and Physical Update  The patient was interviewed and re-examined.  The patient's previous History and Physical has been reviewed and is unchanged from my consult.  There is no change in the plan of care: aortogram, bilateral leg runoff.   I discussed with the patient the nature of angiographic procedures, especially the limited patencies of any endovascular intervention.    The patient is aware of that the risks of an angiographic procedure include but are not limited to: bleeding, infection, access site complications, renal failure, embolization, rupture of vessel, dissection, arteriovenous fistula, possible need for emergent surgical intervention, possible need for surgical procedures to treat the patient's pathology, anaphylactic reaction to contrast, and stroke and death.    The patient is aware of the risks and agrees to proceed.   Adele Barthel, MD, FACS [facility] [place] Office: [phone] Pager: [phone]  06/15/2016, 8:35 AM

## 2016-06-15 NOTE — Progress Notes (Signed)
Site area: rt groin fa sheath pulled and pressure held by Candyce Churn Site Prior to Removal:  Level 0 Pressure Applied For:  20 minutes Manual:   yes Patient Status During Pull:  stAble Post Pull Site:  Level 0 Post Pull Instructions Given:  yes Post Pull Pulses Present: dopplered Dressing Applied:  Gauze and tegaderm Bedrest begins @ 1200 Comments:

## 2016-06-15 NOTE — Discharge Instructions (Signed)

## 2016-06-15 NOTE — Op Note (Signed)
OPERATIVE NOTE   PROCEDURE: 1.  left common femoral artery cannulation under ultrasound guidance 2.  Placement of catheter in aorta 3.  Aortogram 4.  Conscious sedation for 40 min 5.  First order arterial selection 6.  Left leg runoff via catheter 7.  Right leg runoff via sheath  PRE-OPERATIVE DIAGNOSIS: lifestyle limiting intermittent claudication   POST-OPERATIVE DIAGNOSIS: same as above   SURGEON: Adele Barthel, MD  ANESTHESIA: conscious sedation  ESTIMATED BLOOD LOSS: 50 cc  CONTRAST: 95 cc  FINDING(S):  Diffuse calcific atherosclerosis throughout  Aorta: patent  Superior mesenteric artery: patent Celiac artery: not visualized   Right Left  RA Patent proximally, occluded distally, no nephrogram Patent proximally, occluded distally, no nephrogram  CIA Patent, heavily calcified Patent, heavily calcified  EIA Patent Patent  IIA Patent Patent  CFA Patent, 50-75% stenosis due to calcific posterior plaque Patent  SFA Occluded throughout Occluded with distal reconstitution  PFA Patent, extensive collaterals Patent, extensive collaterals  Pop Reconstitutes via collaterals short patent segment followed by focal occlusion then sub-total occlusion by calcific plaque, below-the-knee artery is only 2-2.5 mm Reconstituted distal SFA/pop which is calcified and diseased  Trif Patent, <2 mm Patent, <2 mm  AT Patent, <2 mm Patent, <2 mm  Pero Patent, <2 mm Patent, <2 mm  PT Patent, <2 mm Patent, <2 mm   SPECIMEN(S):  none  INDICATIONS:   [name] is a 71 y.o. female who presents with bilateral intermittent claudication that is not progressing to rest pain at times.  The patient presents for: aortogram, bilateral leg runoff.  I discussed with the patient the nature of angiographic procedures, especially the limited patencies of any endovascular intervention.  I discussed with the patient the nature of angiographic procedures, especially the limited patencies of any  endovascular intervention.  The patient is aware of that the risks of an angiographic procedure include but are not limited to: bleeding, infection, access site complications, renal failure, embolization, rupture of vessel, dissection, arteriovenous fistula, possible need for emergent surgical intervention, possible need for surgical procedures to treat the patient's pathology, anaphylactic reaction to contrast, and stroke and death.  The patient is aware of the risks and agrees to proceed.    DESCRIPTION: After full informed consent was obtained from the patient, the patient was brought back to the angiography suite.  The patient was placed supine upon the angiography table and connected to cardiopulmonary monitoring equipment.  The patient was then given conscious sedation, the amounts of which are documented in the patient's chart.  The patient was prepped and drape in the standard fashion for an angiographic procedure.  At this point, attention was turned to the right groin.  Under ultrasound guidance, the subcutaneous tissue surrounding the right common femoral artery was anesthesized with 1% lidocaine with epinephrine.  The artery was then cannulated with a micropuncture needle.  The microwire would not advance.  I removed the needle and wire and held pressure.  After 3 minutes, I again cannulated the artery under Sonosite guidance.  Again the wire would not fully advance, so I pulled the needle and wire again and held pressure.  I turned my attention to the left groin.  The artery on this side seemed to have a high bifurcation, so I turned my attention back to the right groin.  This time I could both cannulate the common femoral artery but advance the microwire past the 50-75% calcific stenosis visualized under ultrasound.  The microwire was advanced into the  iliac arterial system.  The needle was exchanged for a microsheath, which was loaded into the common femoral artery over the wire.  The microwire  was exchanged for a Bentson wire which was advanced into the aorta.  The microsheath was then exchanged for a 5-Fr sheath which was loaded into the common femoral artery.  The Omniflush catheter was then loaded over the wire up to the level of L1.  The catheter was connected to the power injector circuit.  After de-airring and de-clotting the circuit, a power injector aortogram was completed.  The findings are listed above.  The Bentson wire was replaced in the catheter, and using the [facility] and Omniflush catheter, the left common iliac artery was selected.  The wire was advanced into the left external iliac artery.  Due to inability to advance the catheter, it was exchanged for a straight catheter, which was advanced into the common iliac artery.  The catheter appeared to be stuck on some calcific iliac disease.  I exchanged the wire for a Glidewire and advanced the wire into the left common femoral artery.  I still could not advance the catheter into the left external iliac artery.  An automated left leg runoff was completed after de-airring and de-clotting the circuit.  The findings are as listed above.    I pulled out the straight catheter from the sheath.  The right sheath was aspirated and no clot was present.  The sheath was connected to the power injector circuit.  An automated right leg runoff was completed after de-airring and de-clotting the circuit.  The findings are as listed above.  The sheath was aspirated.  No clots were present and the sheath was reloaded with heparinized saline.    Based on the images, this patient needs: left common femoral artery to above-the-knee popliteal bypass.  Also the right leg will likely need right common femoral artery to below-the-knee popliteal bypass..   COMPLICATIONS: none  CONDITION: stable   Adele Barthel, MD, [facility] [place] Office: [phone] Pager: [phone]  06/15/2016, 11:14 AM

## 2016-06-16 ENCOUNTER — Encounter (HOSPITAL_COMMUNITY): Payer: Self-pay | Admitting: Vascular Surgery

## 2016-06-16 DIAGNOSIS — N2581 Secondary hyperparathyroidism of renal origin: Secondary | ICD-10-CM | POA: Diagnosis not present

## 2016-06-16 DIAGNOSIS — D631 Anemia in chronic kidney disease: Secondary | ICD-10-CM | POA: Diagnosis not present

## 2016-06-16 DIAGNOSIS — N186 End stage renal disease: Secondary | ICD-10-CM | POA: Diagnosis not present

## 2016-06-16 DIAGNOSIS — E119 Type 2 diabetes mellitus without complications: Secondary | ICD-10-CM | POA: Diagnosis not present

## 2016-06-16 DIAGNOSIS — D509 Iron deficiency anemia, unspecified: Secondary | ICD-10-CM | POA: Diagnosis not present

## 2016-06-19 DIAGNOSIS — N186 End stage renal disease: Secondary | ICD-10-CM | POA: Diagnosis not present

## 2016-06-19 DIAGNOSIS — D509 Iron deficiency anemia, unspecified: Secondary | ICD-10-CM | POA: Diagnosis not present

## 2016-06-19 DIAGNOSIS — N2581 Secondary hyperparathyroidism of renal origin: Secondary | ICD-10-CM | POA: Diagnosis not present

## 2016-06-19 DIAGNOSIS — E119 Type 2 diabetes mellitus without complications: Secondary | ICD-10-CM | POA: Diagnosis not present

## 2016-06-19 DIAGNOSIS — D631 Anemia in chronic kidney disease: Secondary | ICD-10-CM | POA: Diagnosis not present

## 2016-06-21 DIAGNOSIS — E119 Type 2 diabetes mellitus without complications: Secondary | ICD-10-CM | POA: Diagnosis not present

## 2016-06-21 DIAGNOSIS — D509 Iron deficiency anemia, unspecified: Secondary | ICD-10-CM | POA: Diagnosis not present

## 2016-06-21 DIAGNOSIS — B351 Tinea unguium: Secondary | ICD-10-CM | POA: Diagnosis not present

## 2016-06-21 DIAGNOSIS — I739 Peripheral vascular disease, unspecified: Secondary | ICD-10-CM | POA: Diagnosis not present

## 2016-06-21 DIAGNOSIS — D631 Anemia in chronic kidney disease: Secondary | ICD-10-CM | POA: Diagnosis not present

## 2016-06-21 DIAGNOSIS — M79675 Pain in left toe(s): Secondary | ICD-10-CM | POA: Diagnosis not present

## 2016-06-21 DIAGNOSIS — L84 Corns and callosities: Secondary | ICD-10-CM | POA: Diagnosis not present

## 2016-06-21 DIAGNOSIS — N2581 Secondary hyperparathyroidism of renal origin: Secondary | ICD-10-CM | POA: Diagnosis not present

## 2016-06-21 DIAGNOSIS — N186 End stage renal disease: Secondary | ICD-10-CM | POA: Diagnosis not present

## 2016-06-21 DIAGNOSIS — M2012 Hallux valgus (acquired), left foot: Secondary | ICD-10-CM | POA: Diagnosis not present

## 2016-06-23 DIAGNOSIS — N2581 Secondary hyperparathyroidism of renal origin: Secondary | ICD-10-CM | POA: Diagnosis not present

## 2016-06-23 DIAGNOSIS — N186 End stage renal disease: Secondary | ICD-10-CM | POA: Diagnosis not present

## 2016-06-23 DIAGNOSIS — D631 Anemia in chronic kidney disease: Secondary | ICD-10-CM | POA: Diagnosis not present

## 2016-06-23 DIAGNOSIS — E119 Type 2 diabetes mellitus without complications: Secondary | ICD-10-CM | POA: Diagnosis not present

## 2016-06-23 DIAGNOSIS — D509 Iron deficiency anemia, unspecified: Secondary | ICD-10-CM | POA: Diagnosis not present

## 2016-06-26 ENCOUNTER — Other Ambulatory Visit: Payer: Self-pay

## 2016-06-26 ENCOUNTER — Encounter (HOSPITAL_COMMUNITY): Payer: Self-pay

## 2016-06-26 ENCOUNTER — Emergency Department (HOSPITAL_COMMUNITY)
Admission: EM | Admit: 2016-06-26 | Discharge: 2016-06-26 | Disposition: A | Discharge status: Home / Self Care | Payer: Medicare Other | Attending: Emergency Medicine | Admitting: Emergency Medicine

## 2016-06-26 DIAGNOSIS — E039 Hypothyroidism, unspecified: Secondary | ICD-10-CM | POA: Diagnosis not present

## 2016-06-26 DIAGNOSIS — I132 Hypertensive heart and chronic kidney disease with heart failure and with stage 5 chronic kidney disease, or end stage renal disease: Secondary | ICD-10-CM | POA: Diagnosis not present

## 2016-06-26 DIAGNOSIS — Z79899 Other long term (current) drug therapy: Secondary | ICD-10-CM | POA: Insufficient documentation

## 2016-06-26 DIAGNOSIS — E119 Type 2 diabetes mellitus without complications: Secondary | ICD-10-CM | POA: Diagnosis not present

## 2016-06-26 DIAGNOSIS — D509 Iron deficiency anemia, unspecified: Secondary | ICD-10-CM | POA: Diagnosis not present

## 2016-06-26 DIAGNOSIS — D631 Anemia in chronic kidney disease: Secondary | ICD-10-CM | POA: Diagnosis not present

## 2016-06-26 DIAGNOSIS — N186 End stage renal disease: Secondary | ICD-10-CM | POA: Diagnosis not present

## 2016-06-26 DIAGNOSIS — J449 Chronic obstructive pulmonary disease, unspecified: Secondary | ICD-10-CM | POA: Diagnosis not present

## 2016-06-26 DIAGNOSIS — Z7982 Long term (current) use of aspirin: Secondary | ICD-10-CM | POA: Diagnosis not present

## 2016-06-26 DIAGNOSIS — Z85528 Personal history of other malignant neoplasm of kidney: Secondary | ICD-10-CM | POA: Insufficient documentation

## 2016-06-26 DIAGNOSIS — Z8673 Personal history of transient ischemic attack (TIA), and cerebral infarction without residual deficits: Secondary | ICD-10-CM | POA: Insufficient documentation

## 2016-06-26 DIAGNOSIS — I952 Hypotension due to drugs: Secondary | ICD-10-CM | POA: Diagnosis not present

## 2016-06-26 DIAGNOSIS — I5032 Chronic diastolic (congestive) heart failure: Secondary | ICD-10-CM | POA: Insufficient documentation

## 2016-06-26 DIAGNOSIS — I2510 Atherosclerotic heart disease of native coronary artery without angina pectoris: Secondary | ICD-10-CM | POA: Insufficient documentation

## 2016-06-26 DIAGNOSIS — T465X5A Adverse effect of other antihypertensive drugs, initial encounter: Secondary | ICD-10-CM | POA: Diagnosis not present

## 2016-06-26 DIAGNOSIS — Z87891 Personal history of nicotine dependence: Secondary | ICD-10-CM | POA: Insufficient documentation

## 2016-06-26 DIAGNOSIS — N2581 Secondary hyperparathyroidism of renal origin: Secondary | ICD-10-CM | POA: Diagnosis not present

## 2016-06-26 LAB — CBC
HCT: 34 % — ABNORMAL LOW (ref 36.0–46.0)
Hemoglobin: 10.6 g/dL — ABNORMAL LOW (ref 12.0–15.0)
MCH: 26.7 pg (ref 26.0–34.0)
MCHC: 31.2 g/dL (ref 30.0–36.0)
MCV: 85.6 fL (ref 78.0–100.0)
PLATELETS: 176 10*3/uL (ref 150–400)
RBC: 3.97 MIL/uL (ref 3.87–5.11)
RDW: 15 % (ref 11.5–15.5)
WBC: 4 10*3/uL (ref 4.0–10.5)

## 2016-06-26 LAB — BASIC METABOLIC PANEL
Anion gap: 13 (ref 5–15)
BUN: 12 mg/dL (ref 6–20)
CALCIUM: 8.6 mg/dL — AB (ref 8.9–10.3)
CO2: 31 mmol/L (ref 22–32)
CREATININE: 5.28 mg/dL — AB (ref 0.44–1.00)
Chloride: 93 mmol/L — ABNORMAL LOW (ref 101–111)
GFR calc Af Amer: 9 mL/min — ABNORMAL LOW (ref >60)
GFR, EST NON AFRICAN AMERICAN: 7 mL/min — AB (ref >60)
GLUCOSE: 90 mg/dL (ref 65–99)
POTASSIUM: 4.1 mmol/L (ref 3.5–5.1)
SODIUM: 137 mmol/L (ref 135–145)

## 2016-06-26 LAB — CBG MONITORING, ED: GLUCOSE-CAPILLARY: 82 mg/dL (ref 65–99)

## 2016-06-26 NOTE — Discharge Instructions (Signed)
As we discussed, I think your symptoms were a result of your low blood pressure.  DO NOT take your Amlodipine or any of your blood pressure medications tonight.  Call Dr Tye Savoy or Dr Joelyn Oms in the morning for advice about your antihypertensive regimen.  I would recommend monitoring your blood pressure at home and keeping a record of this.  If you start experiencing worsening of your symptoms, please seek immediate medical attention.

## 2016-06-26 NOTE — ED Triage Notes (Signed)
Nurse first aware of pt BP

## 2016-06-26 NOTE — ED Triage Notes (Signed)
When returning home from dialysis today she began feeling "bad" and had diarrhea. PT report that is not her normal. PT denies pain just ffels bad.

## 2016-06-26 NOTE — ED Provider Notes (Signed)
[facility] DEPT Provider Note   CSN: [id number] Arrival date & time: 06/26/16  1620   History   Chief Complaint Chief Complaint  Patient presents with  . Weakness    HPI [name] is a 71 y.o. female.  Patient reports that she was in her usual state of health until after she got home from HD.  Dry weight 74.5 kg.  She reports that she was 82.6 kg before HD and 78.6 kg after HD.  She reports that her BP was getting soft at HD and therefore more fluid was not taken off.  She reports her BP after HD was 117/70.  She notes that she felt ok after HD but after she ate and took her BP meds (Coreg and Hydralazine) and binders, she started feeling nauseous and weak (around 3:30pm).  She reports she has never felt this way before.  She reports several episodes of nonbloody diarrhea, she reports that this is chronic for her.  No SOB, CP, dizziness.  At baseline, she reports she ambulates independently.  She reports that symptoms are improving.  She also reports taking tramadol prior to HD today.     Past Medical History:  Diagnosis Date  . Arthritis of shoulder    Bilateral  . CAD (coronary artery disease)    stent to RCA  . Cancer ([place])    clear cell cancer, kidney  . Chronic renal insufficiency    On hemodialysis  . Complication of anesthesia 12/2010   pt is very confused, with AMS with anesthesia  . COPD (chronic obstructive pulmonary disease) (H[facility]) t  . CVA 07/27/2008   CVA affected cognition and memory per family, no focal deficits.    . CVA (cerebral infarction) 2003   no apparent residual  . Depression   . Diastolic congestive heart failure ([place])   . Dyslipidemia   . Encephalopathy   . ESRD 07/27/2008   ESRD due to HTN and NSAID's, started hemodialysis in 2005 in [place], [place]. Went to [facility] from 2010 to 2012 and since 2012 has been getting dialysis at [facility] on [address] in [place] on a MWF schedule. First access with RUA AVG placed in [place].  Next and current access was LUA AVG placed by Dr. Lucky Cowboy in [place] in or around 2012. Has had 2 or 3 procedures on that graft since placed per family. She gets her access work done here in [place] now. She is allergic to heparin and does not get any heparin at dialysis; she had an allergic reaction apparently when in ICU in the past.      . Hyperlipidemia   . Hypertension   . Hypothyroidism   . Positive PPD    completed rifampin  . PVD (peripheral vascular disease) ([place])   . Seizures [facility])     Patient Active Problem List   Diagnosis Date Noted  . Dyslipidemia 05/25/2016  . Coronary artery disease involving native coronary artery of native heart without angina pectoris 05/25/2016  . Atherosclerosis of native arteries of extremity with intermittent claudication ([place]) 12/16/2014  . Nausea vomiting and diarrhea 09/03/2014  . Hyperkalemia 09/03/2014  . Diarrhea 09/03/2014  . ESRD (end stage renal disease) ([place])   . Pulmonary hypertension ([place]) 09/23/2012  . Hyperkalemia, diminished renal excretion 06/27/2012  . Generalized convulsive epilepsy without mention of intractable epilepsy 09/14/2011  . Epilepsy ([place]) 05/04/2011  . Physical deconditioning 05/04/2011  . HTN (hypertension) 04/30/2011  . Depression 04/30/2011  . GERD (gastroesophageal reflux disease) 04/30/2011  .  Seizure ([place]) 04/29/2011  . Nonspecific abnormal electrocardiogram (ECG) (EKG) 02/23/2011  . Essential hypertension, malignant 05/11/2009  . Chronic diastolic heart failure ([place]) 05/11/2009  . TOBACCO USER 07/27/2008  . Coronary atherosclerosis 07/27/2008  . CVA 07/27/2008  . PVD 07/27/2008  . ESRD 07/27/2008  . OTHER AND UNSPECIFIED HYPERLIPIDEMIA 07/24/2008    Past Surgical History:  Procedure Laterality Date  . ABDOMINAL AORTOGRAM W/LOWER EXTREMITY Bilateral 06/15/2016   Procedure: Abdominal Aortogram w/Lower Extremity;  Surgeon: Conrad [place], MD;  Location: [facility];  Service: Cardiovascular;   Laterality: Bilateral;  . ABDOMINAL HYSTERECTOMY    . CARDIAC CATHETERIZATION    . CATARACT EXTRACTION Bilateral   . CHOLECYSTECTOMY    . D&Cs    . LEFT HEART CATHETERIZATION WITH CORONARY ANGIOGRAM N/A 02/22/2011   Procedure: LEFT HEART CATHETERIZATION WITH CORONARY ANGIOGRAM;  Surgeon: Wellington Hampshire, MD;  Location: [facility] CATH LAB;  Service: Cardiovascular;  Laterality: N/A;  . left nephrectomy    . NEPHRECTOMY Left    Malignant tumor  . PERIPHERAL VASCULAR CATHETERIZATION N/A 12/31/2014   Procedure: Abdominal Aortogram;  Surgeon: Conrad Woodworth, MD;  Location: [facility];  Service: Cardiovascular;  Laterality: N/A;  . right ankle repair    . RIGHT HEART CATHETERIZATION N/A 01/29/2014   Procedure: RIGHT HEART CATH;  Surgeon: Larey Dresser, MD;  Location: [facility] CATH LAB;  Service: Cardiovascular;  Laterality: N/A;  . TONSILLECTOMY    . VASCULAR SURGERY  11/2010   graft inserted to left arm    OB History    No data available       Home Medications    Prior to Admission medications   Medication Sig Start Date End Date Taking? Authorizing Provider  acetaminophen (TYLENOL) 500 MG tablet Take 1,000 mg by mouth daily as needed for mild pain.    Yes [provider]  ambrisentan (LETAIRIS) 5 MG tablet TAKE 1 TABLET (5 MG) ORALLY DAILY. Patient taking differently: Take 5 mg by mouth daily. TAKE 1 TABLET (5 MG) ORALLY DAILY. 11/24/15  Yes Juanito Doom, MD  amLODipine (NORVASC) 10 MG tablet Take 10 mg by mouth at bedtime.    Yes [provider]  aspirin EC 81 MG tablet Take 81 mg by mouth daily.   Yes [provider]  carvedilol (COREG) 25 MG tablet Take 25 mg by mouth 2 (two) times daily with a meal.   Yes [provider]  cilostazol (PLETAL) 100 MG tablet Take 1 tablet (100 mg total) by mouth 2 (two) times daily before a meal. 05/24/16  Yes Conrad Bronx, MD  diphenhydrAMINE (BENADRYL) 50 MG capsule Take 50 mg by mouth on 12/24/14, at 6:30  am. Patient taking differently: Take 50 mg by mouth every Monday, Wednesday, and Friday. On dialysis days 12/24/14  Yes Angelia Mould, MD  folic acid-vitamin b complex-vitamin c-selenium-zinc (DIALYVITE) 3 MG TABS tablet Take 1 tablet by mouth daily.   Yes [provider]  hydrALAZINE (APRESOLINE) 25 MG tablet Take 25 mg by mouth 3 (three) times daily.   Yes [provider]  isosorbide dinitrate (ISORDIL) 20 MG tablet Take 20 mg by mouth daily.   Yes [provider]  levETIRAcetam (KEPPRA) 500 MG tablet Take 500 mg by mouth daily at 8 pm.   Yes [provider]  levothyroxine (SYNTHROID, LEVOTHROID) 150 MCG tablet Take 150 mcg by mouth daily before breakfast.   Yes [provider]  omeprazole (PRILOSEC OTC) 20 MG  tablet Take 20 mg by mouth daily.   Yes [provider]  pravastatin (PRAVACHOL) 40 MG tablet Take 1 tablet (40 mg total) by mouth at bedtime. 05/23/16  Yes Minus Breeding, MD  SENSIPAR 90 MG tablet Take 90 mg by mouth at bedtime.  03/05/14  Yes [provider]  sevelamer carbonate (RENVELA) 800 MG tablet Take 1,600-3,200 mg by mouth See admin instructions. 4 tabs TID with each meal, 2 tabs with each snack.   Yes [provider]  traMADol (ULTRAM) 50 MG tablet Take 50 mg by mouth every 12 (twelve) hours as needed for moderate pain.  04/05/14  Yes [provider]  traZODone (DESYREL) 50 MG tablet Take 50 mg by mouth at bedtime as needed for sleep.    Yes [provider]  albuterol (PROVENTIL HFA;VENTOLIN HFA) 108 (90 Base) MCG/ACT inhaler Inhale 1-2 puffs into the lungs every 6 (six) hours as needed for wheezing or shortness of breath. 09/02/15   Juanito Doom, MD  nitroGLYCERIN (NITROSTAT) 0.4 MG SL tablet PLACE 1 TABLET (0.4 MG TOTAL) UNDER THE TONGUE EVERY 5 (FIVE) MINUTES AS NEEDED FOR CHEST PAIN. 06/12/16   Juanito Doom, MD    Family History Family History  Problem Relation Age of  Onset  . Heart disease Father   . Hypertension Mother   . Dementia Mother   . Coronary artery disease Sister   . Heart attack Sister   . Hypertension Brother     Social History Social History  Substance Use Topics  . Smoking status: Former Smoker    Packs/day: 0.50    Years: 53.00    Types: Cigarettes    Quit date: 12/17/2015  . Smokeless tobacco: Never Used     Comment: down to .25ppd  . Alcohol use 0.0 oz/week     Comment: very rarely per pt     Allergies   Ace inhibitors; Heparin; Iohexol; Phenytoin; Wellbutrin [bupropion]; Meperidine hcl; Morphine; Penicillins; Valproic acid and related; Iodinated diagnostic agents; and Pentazocine lactate   Review of Systems Review of Systems  Constitutional: Negative for chills and diaphoresis.  HENT: Negative for congestion, rhinorrhea and sore throat.   Eyes: Negative for visual disturbance.  Respiratory: Negative for cough, chest tightness and shortness of breath.   Cardiovascular: Negative for chest pain.  Gastrointestinal: Positive for diarrhea and nausea. Negative for blood in stool and vomiting.  Genitourinary: Negative for difficulty urinating (still produces small amounts of urine 3x/day), hematuria and vaginal bleeding.  Musculoskeletal: Positive for myalgias (LE, chronic).  Neurological: Positive for weakness (sensation of generalized). Negative for dizziness, speech difficulty and light-headedness.    Physical Exam Updated Vital Signs BP (!) 130/51   Pulse 72   Temp 98 F (36.7 C) (Oral)   Resp 16   Ht 5' 2.5" (1.588 m)   Wt 80.3 kg (177 lb)   SpO2 97%   BMI 31.86 kg/m   Physical Exam  Constitutional: She is oriented to person, place, and time. She appears well-developed and well-nourished. No distress.  Daughter at bedside  HENT:  Head: Normocephalic and atraumatic.  Mouth/Throat: No oropharyngeal exudate.  Eyes: Conjunctivae and EOM are normal. Pupils are equal, round, and reactive to light.  Neck:  Normal range of motion. Neck supple.  Cardiovascular: Normal rate, regular rhythm and normal heart sounds.   LUE with AVF, which has palpable thrill and no evidence of infection  Pulmonary/Chest: Effort normal and breath sounds normal. No respiratory distress. She has no wheezes.  Abdominal: Soft. Bowel sounds are normal. She exhibits no distension. There is no tenderness. There is no guarding.  Musculoskeletal: Normal range of motion. She exhibits no edema or deformity.  Neurological: She is alert and oriented to person, place, and time. No cranial nerve deficit.  Skin: Skin is warm. She is not diaphoretic.  Psychiatric: She has a normal mood and affect. Her behavior is normal. Judgment and thought content normal.  Nursing note and vitals reviewed.  ED Treatments / Results  Labs (all labs ordered are listed, but only abnormal results are displayed) Labs Reviewed  BASIC METABOLIC PANEL - Abnormal; Notable for the following:       Result Value   Chloride 93 (*)    Creatinine, Ser 5.28 (*)    Calcium 8.6 (*)    GFR calc non Af Amer 7 (*)    GFR calc Af Amer 9 (*)    All other components within normal limits  CBC - Abnormal; Notable for the following:    Hemoglobin 10.6 (*)    HCT 34.0 (*)    All other components within normal limits  CBG MONITORING, ED    EKG  EKG Interpretation  Date/Time:  Monday June 26 2016 16:41:36 EDT Ventricular Rate:  69 PR Interval:  180 QRS Duration: 80 QT Interval:  474 QTC Calculation: 507 R Axis:   6 Text Interpretation:  Normal sinus rhythm with sinus arrhythmia Low voltage QRS Possible Anterolateral infarct , age undetermined Abnormal ECG previous t wave inversion aVL has resolved Confirmed by Theotis Burrow [phone]) on 06/26/2016 5:52:59 PM       Radiology No results found.  Procedures Procedures (including critical care time)  Medications Ordered in ED Medications - No data to display   Orthostatic VS for the past 24 hrs:  BP- Lying  Pulse- Lying BP- Sitting Pulse- Sitting BP- Standing at 0 minutes Pulse- Standing at 0 minutes  06/26/16 1935 119/56 74 114/54 76 112/59 80    Initial Impression / Assessment and Plan / ED Course  I have reviewed the triage vital signs and the nursing notes.  Pertinent labs & imaging results that were available during my care of the patient were reviewed by me and considered in my medical decision making (see chart for details).    1837: BP initially 86/56.  It is improving, 110/59 on Cardiac monitoring.  IV Fluid bolus considered but she is fluid up after HD and BPs improving independently.  Will PO challenge.   Final Clinical Impressions(s) / ED Diagnoses   Final diagnoses:  Hypotension due to medication   [name] is a 71 y.o. female that presents to ED for sensation of generalized weakness and nausea after HD today.  Her BMP reveals a normal BUN and electrolytes.  Her hgb stable from last 08/2015.  No leukocytosis.  No findings to suggest infection.  She does have baseline diarrhea but she notes that this is stable.  She has an unremarkable exam.  Her symptoms were resolving as BP was improving.  She tolerated PO without difficulty.  Orthostatic vitals were WNL and patient completley asymptomatic during.  I recommended that she call Dr Tye Savoy or Dr Joelyn Oms in the am to get advice regarding her BP regimen. She should follow up with PCP/ [facility] in the next 2 days for BP check.  She has HD scheduled MWF and expects to see Dr Joelyn Oms on Wednesday.  Strict return precautions reviewed.  She was discharged in stable condition with her  daughter.  New Prescriptions New Prescriptions   No medications on file     Janora Norlander, DO 06/26/16 [place], Wenda Overland, MD 06/29/16 [phone]

## 2016-06-26 NOTE — ED Notes (Signed)
Pt provided with turkey sandwich  and ice water 

## 2016-06-27 ENCOUNTER — Telehealth: Payer: Self-pay | Admitting: Internal Medicine

## 2016-06-27 NOTE — Telephone Encounter (Signed)
Check-in call post ED visit; pt states she is doing fine, went to ED because her BP dropped after dialysis and that she had opportunity to discuss during ED visit. Didn't feel the need for a call from [place] for med reconcile.

## 2016-06-28 DIAGNOSIS — E119 Type 2 diabetes mellitus without complications: Secondary | ICD-10-CM | POA: Diagnosis not present

## 2016-06-28 DIAGNOSIS — N186 End stage renal disease: Secondary | ICD-10-CM | POA: Diagnosis not present

## 2016-06-28 DIAGNOSIS — D509 Iron deficiency anemia, unspecified: Secondary | ICD-10-CM | POA: Diagnosis not present

## 2016-06-28 DIAGNOSIS — N2581 Secondary hyperparathyroidism of renal origin: Secondary | ICD-10-CM | POA: Diagnosis not present

## 2016-06-28 DIAGNOSIS — D631 Anemia in chronic kidney disease: Secondary | ICD-10-CM | POA: Diagnosis not present

## 2016-06-28 NOTE — Pre-Procedure Instructions (Signed)
[name]  06/28/2016      [id number] [place], [place] - [place] [address] [name] [address] Phone: [phone] Fax: [phone]  [facility] [place], [place] - [name] [address] Phone: [phone] Fax: [phone]  [facility] [place] [place] [facility] [address] [place] [address] Phone: [phone] Fax: [place] [address] Phone: [phone] Fax: [phone]    Your procedure is scheduled on July 9  Report to [facility] at [phone].  Call this number if you have problems the morning of surgery:  [phone]   Remember:  Do not eat food or drink liquids after midnight.  Take these medicines the morning of surgery with A SIP OF WATER Albuterol inhaler if needed- bring your inhalers with you on the day of surgery, ambrisentan (Letairis), carvedilol (Coreg), Hydralazine (Apresoline), Isosorbide dinitrate (IsordiL), Levothyroxine (Synthroid), Nitrostat if needed, Omeprazole (Prilosec), Tamadol (Ultram) if needed  Stop/ Take aspirin and Pletal  as directed by your Dr.  Stop taking BC's, Goody's, Herbal medications, Fish Oil, Ibuprofen, Advil, Motrin, Vitamins   Do not wear jewelry, make-up or nail polish.  Do not wear lotions, powders, or perfumes, or deoderant.  Do not shave 48 hours prior to surgery.  Men may shave face and neck.  Do not bring valuables to the hospital.  [facility] is not responsible for any belongings or valuables.  Contacts, dentures or bridgework may not be worn into surgery.  Leave your suitcase in the car.  After surgery it may be brought to your room.  For patients admitted to the  hospital, discharge time will be determined by your treatment team.  Patients discharged the day of surgery will not be allowed to drive home.    Special instructions:  - Preparing for Surgery  Before surgery, you can play an important role.  Because skin is not sterile, your skin needs to be as free of germs as possible.  You can reduce the number of germs on you skin by washing with CHG (chlorahexidine gluconate) soap before surgery.  CHG is an antiseptic cleaner which kills germs and bonds with the skin to continue killing germs even after washing.  Please DO NOT use if you have an allergy to CHG or antibacterial soaps.  If your skin becomes reddened/irritated stop using the CHG and inform your nurse when you arrive at Short Stay.  Do not shave (including legs and underarms) for at least 48 hours prior to the first CHG shower.  You may shave your face.  Please follow these instructions carefully:   1.  Shower with CHG Soap the night before surgery and the                                morning of Surgery.  2.  If you choose to wash your hair, wash your hair first as usual with your       normal shampoo.  3.  After you shampoo, rinse your hair and body thoroughly to remove the  Shampoo.  4.  Use CHG as you would any other liquid soap.  You can apply chg directly       to the skin and wash gently with scrungie or a clean washcloth.  5.  Apply the CHG Soap to your body ONLY FROM THE NECK DOWN.        Do not use on open wounds or open sores.  Avoid contact with your eyes,       ears, mouth and genitals (private parts).  Wash genitals (private parts)       with your normal soap.  6.  Wash thoroughly, paying special attention to the area where your surgery        will be performed.  7.  Thoroughly rinse your body with warm water from the neck down.  8.  DO NOT shower/wash with your normal soap after using and rinsing off       the CHG Soap.  9.  Pat yourself dry  with a clean towel.            10.  Wear clean pajamas.            11.  Place clean sheets on your bed the night of your first shower and do not        sleep with pets.  Day of Surgery  Do not apply any lotions/deoderants the morning of surgery.  Please wear clean clothes to the hospital/surgery center.     Please read over the following fact sheets that you were given. Pain Booklet, Coughing and Deep Breathing, MRSA Information and Surgical Site Infection Prevention

## 2016-06-29 ENCOUNTER — Inpatient Hospital Stay (HOSPITAL_COMMUNITY)
Admission: RE | Admit: 2016-06-29 | Discharge: 2016-06-29 | Disposition: A | Discharge status: Home / Self Care | Payer: Medicare Other | Source: Ambulatory Visit

## 2016-06-29 DIAGNOSIS — D631 Anemia in chronic kidney disease: Secondary | ICD-10-CM | POA: Diagnosis not present

## 2016-06-29 DIAGNOSIS — E119 Type 2 diabetes mellitus without complications: Secondary | ICD-10-CM | POA: Diagnosis not present

## 2016-06-29 DIAGNOSIS — N186 End stage renal disease: Secondary | ICD-10-CM | POA: Diagnosis not present

## 2016-06-29 DIAGNOSIS — D509 Iron deficiency anemia, unspecified: Secondary | ICD-10-CM | POA: Diagnosis not present

## 2016-06-29 DIAGNOSIS — N2581 Secondary hyperparathyroidism of renal origin: Secondary | ICD-10-CM | POA: Diagnosis not present

## 2016-06-29 NOTE — Progress Notes (Signed)
Not here for PAT appointment called and states she will not be able to make it today. Informed her that our scheduler will call her to reschedule.

## 2016-07-01 DIAGNOSIS — N186 End stage renal disease: Secondary | ICD-10-CM | POA: Diagnosis not present

## 2016-07-01 DIAGNOSIS — E1129 Type 2 diabetes mellitus with other diabetic kidney complication: Secondary | ICD-10-CM | POA: Diagnosis not present

## 2016-07-01 DIAGNOSIS — Z992 Dependence on renal dialysis: Secondary | ICD-10-CM | POA: Diagnosis not present

## 2016-07-03 DIAGNOSIS — E119 Type 2 diabetes mellitus without complications: Secondary | ICD-10-CM | POA: Diagnosis not present

## 2016-07-03 DIAGNOSIS — D631 Anemia in chronic kidney disease: Secondary | ICD-10-CM | POA: Diagnosis not present

## 2016-07-03 DIAGNOSIS — N186 End stage renal disease: Secondary | ICD-10-CM | POA: Diagnosis not present

## 2016-07-03 DIAGNOSIS — N2581 Secondary hyperparathyroidism of renal origin: Secondary | ICD-10-CM | POA: Diagnosis not present

## 2016-07-03 DIAGNOSIS — D509 Iron deficiency anemia, unspecified: Secondary | ICD-10-CM | POA: Diagnosis not present

## 2016-07-04 ENCOUNTER — Other Ambulatory Visit: Payer: Self-pay

## 2016-07-05 DIAGNOSIS — N2581 Secondary hyperparathyroidism of renal origin: Secondary | ICD-10-CM | POA: Diagnosis not present

## 2016-07-05 DIAGNOSIS — E119 Type 2 diabetes mellitus without complications: Secondary | ICD-10-CM | POA: Diagnosis not present

## 2016-07-05 DIAGNOSIS — N186 End stage renal disease: Secondary | ICD-10-CM | POA: Diagnosis not present

## 2016-07-05 DIAGNOSIS — D631 Anemia in chronic kidney disease: Secondary | ICD-10-CM | POA: Diagnosis not present

## 2016-07-05 DIAGNOSIS — D509 Iron deficiency anemia, unspecified: Secondary | ICD-10-CM | POA: Diagnosis not present

## 2016-07-06 ENCOUNTER — Encounter (HOSPITAL_COMMUNITY)
Admission: RE | Admit: 2016-07-06 | Discharge: 2016-07-06 | Disposition: A | Discharge status: Home / Self Care | Payer: Medicare Other | Source: Ambulatory Visit | Attending: Vascular Surgery | Admitting: Vascular Surgery

## 2016-07-06 ENCOUNTER — Telehealth: Payer: Self-pay | Admitting: Radiology

## 2016-07-06 ENCOUNTER — Telehealth: Payer: Self-pay | Admitting: *Deleted

## 2016-07-06 ENCOUNTER — Encounter (HOSPITAL_COMMUNITY): Payer: Self-pay

## 2016-07-06 DIAGNOSIS — Z01812 Encounter for preprocedural laboratory examination: Secondary | ICD-10-CM | POA: Insufficient documentation

## 2016-07-06 DIAGNOSIS — I739 Peripheral vascular disease, unspecified: Secondary | ICD-10-CM | POA: Diagnosis not present

## 2016-07-06 DIAGNOSIS — E039 Hypothyroidism, unspecified: Secondary | ICD-10-CM | POA: Diagnosis not present

## 2016-07-06 DIAGNOSIS — I132 Hypertensive heart and chronic kidney disease with heart failure and with stage 5 chronic kidney disease, or end stage renal disease: Secondary | ICD-10-CM | POA: Diagnosis not present

## 2016-07-06 DIAGNOSIS — Z01810 Encounter for preprocedural cardiovascular examination: Secondary | ICD-10-CM

## 2016-07-06 DIAGNOSIS — I5032 Chronic diastolic (congestive) heart failure: Secondary | ICD-10-CM | POA: Diagnosis not present

## 2016-07-06 DIAGNOSIS — I2510 Atherosclerotic heart disease of native coronary artery without angina pectoris: Secondary | ICD-10-CM

## 2016-07-06 HISTORY — DX: Pulmonary hypertension, unspecified: I27.20

## 2016-07-06 HISTORY — DX: Sleep apnea, unspecified: G47.30

## 2016-07-06 HISTORY — DX: Gastro-esophageal reflux disease without esophagitis: K21.9

## 2016-07-06 HISTORY — DX: Anemia, unspecified: D64.9

## 2016-07-06 LAB — COMPREHENSIVE METABOLIC PANEL
ALBUMIN: 3.7 g/dL (ref 3.5–5.0)
ALT: 12 U/L — ABNORMAL LOW (ref 14–54)
ANION GAP: 12 (ref 5–15)
AST: 19 U/L (ref 15–41)
Alkaline Phosphatase: 96 U/L (ref 38–126)
BUN: 23 mg/dL — ABNORMAL HIGH (ref 6–20)
CO2: 32 mmol/L (ref 22–32)
Calcium: 8.8 mg/dL — ABNORMAL LOW (ref 8.9–10.3)
Chloride: 91 mmol/L — ABNORMAL LOW (ref 101–111)
Creatinine, Ser: 7.24 mg/dL — ABNORMAL HIGH (ref 0.44–1.00)
GFR calc non Af Amer: 5 mL/min — ABNORMAL LOW (ref >60)
GFR, EST AFRICAN AMERICAN: 6 mL/min — AB (ref >60)
GLUCOSE: 84 mg/dL (ref 65–99)
POTASSIUM: 4.6 mmol/L (ref 3.5–5.1)
SODIUM: 135 mmol/L (ref 135–145)
TOTAL PROTEIN: 7.2 g/dL (ref 6.5–8.1)
Total Bilirubin: 0.7 mg/dL (ref 0.3–1.2)

## 2016-07-06 LAB — TYPE AND SCREEN
ABO/RH(D): B POS
ANTIBODY SCREEN: NEGATIVE

## 2016-07-06 LAB — CBC
HCT: 38.2 % (ref 36.0–46.0)
Hemoglobin: 11.7 g/dL — ABNORMAL LOW (ref 12.0–15.0)
MCH: 26.8 pg (ref 26.0–34.0)
MCHC: 30.6 g/dL (ref 30.0–36.0)
MCV: 87.4 fL (ref 78.0–100.0)
Platelets: 192 10*3/uL (ref 150–400)
RBC: 4.37 MIL/uL (ref 3.87–5.11)
RDW: 15.1 % (ref 11.5–15.5)
WBC: 4.5 10*3/uL (ref 4.0–10.5)

## 2016-07-06 LAB — PROTIME-INR
INR: 0.97
Prothrombin Time: 12.9 seconds (ref 11.4–15.2)

## 2016-07-06 LAB — SURGICAL PCR SCREEN
MRSA, PCR: NEGATIVE
STAPHYLOCOCCUS AUREUS: NEGATIVE

## 2016-07-06 LAB — APTT: APTT: 31 s (ref 24–36)

## 2016-07-06 NOTE — Progress Notes (Signed)
Cardiologist   Dr.  Percival Spanish   Dialysis M-W-F      [facility]  Pt. Does not make much urine,unable to obtain urine for urinalysis

## 2016-07-06 NOTE — Telephone Encounter (Signed)
Requesting surgical clearance:  1. Type of surgery: L Femoral above knee popliteal bypass  2. Surgeon: Dr. Bridgett Larsson at [facility]  3.Surgical Date:07/11/16  4. Medications that need to be held: ASA   5. CAD: Yes  6. I will defer to:  Dr. Sofie Rower Information: [facility] Fax # [phone] Phone # [phone]

## 2016-07-06 NOTE — Telephone Encounter (Signed)
Called patient, left message to call back.   Order placed.  Notified [facility].

## 2016-07-06 NOTE — Telephone Encounter (Signed)
Left message (patient' voice mail) requesting return call to schedule follow up app't w/ Dr Kathlene Cote.  Nocole Zammit Riki Rusk, RN 07/06/2016 1:39 PM

## 2016-07-06 NOTE — Telephone Encounter (Signed)
I would suggest screening with a Lexiscan Myoview prior to this kind of surgery.  Please call patient.

## 2016-07-07 ENCOUNTER — Telehealth: Payer: Self-pay | Admitting: Pulmonary Disease

## 2016-07-07 ENCOUNTER — Encounter (HOSPITAL_COMMUNITY): Payer: Self-pay | Admitting: Vascular Surgery

## 2016-07-07 DIAGNOSIS — E119 Type 2 diabetes mellitus without complications: Secondary | ICD-10-CM | POA: Diagnosis not present

## 2016-07-07 DIAGNOSIS — N186 End stage renal disease: Secondary | ICD-10-CM | POA: Diagnosis not present

## 2016-07-07 DIAGNOSIS — D509 Iron deficiency anemia, unspecified: Secondary | ICD-10-CM | POA: Diagnosis not present

## 2016-07-07 DIAGNOSIS — N2581 Secondary hyperparathyroidism of renal origin: Secondary | ICD-10-CM | POA: Diagnosis not present

## 2016-07-07 DIAGNOSIS — D631 Anemia in chronic kidney disease: Secondary | ICD-10-CM | POA: Diagnosis not present

## 2016-07-07 NOTE — Telephone Encounter (Signed)
Spoke to patient-aware she needs lexiscan per Dr. Percival Spanish.   Patient surgery 7/10 and reports she has dialysis on Monday until 11AM.   Will speak to Dr. Percival Spanish for recommendations.  Per Dr. Esaw Grandchild high risk and needs lexiscan, having Monday will not give time to read and give clearance.     Attempt to call patient to notify, lmtcb.  Will notify [facility]. Aware and will cancel pending cardiac clearance.

## 2016-07-07 NOTE — Telephone Encounter (Signed)
Patient aware surgery is going to be rescheduled pending cardiac clearance.    [name] is scheduled Tuesday at 9:15, given clear instructions, aware of location, and to arrive by 9AM.    Patient verbalized understanding.

## 2016-07-07 NOTE — Telephone Encounter (Signed)
Called and lmomtcb x 1 for [place].  We can schedule the pt to see SG or TP if they have any openings for surgical clearance.

## 2016-07-07 NOTE — Progress Notes (Signed)
Anesthesia Chart Review: Patient is a 71 year old female scheduled for left femoral to above-knee popliteal bypass graft on 07/11/2016 by Dr. Bridgett Larsson.  History includes former smoker (quit 12/17/15), CAD s/p RCA stent [facility], [place] ~ '08), stress induced cardiomyopathy 02/22/11 (in setting of HCAP), pulmonary hypertension, diastolic CHF, HTN, clear cell renal cancer s/p left radical nephrectomy 08/31/09, ESRD (on HD since '05; MWF [place] [facility]), [place] CVA '03, PAD s/p bilateral iliac stents '02, hypothyroidism, HLD, + PPD (s/p rifampin), GERD, seizure (> 5 years ago), anemia, OSA, appendectomy, hysterectomy, cholecystectomy. For anesthesia complications, she reported confusion/AMS. BMI is consistent with obesity.   Meds include albuterol, Letairis (ambrisentan; for pulmonary hypertension), amlodipine, aspirin 81 mg, Coreg, Pletal, hydralazine, Isordil, Keppra, levothyroxine, nitroglycerin, Prilosec OTC, pravastatin, Sensipar, Renvela, tramadol, trazodone, Dialyvite. She has multiple allergies (see list), including Heparin.   - PCP is listed as Dr. Glendale Chard. - Cardiologist is Dr. Minus Breeding, last visit 5/2/418. She was not having any new symptoms.  - Pulmonologist is Dr. Simonne Maffucci, last 02/12/16. He wrote, "Her echocardiogram grossly overestimates her actual PA pressure numbers. She has been hesitant to change any of her medications in the past and given the fact that she stasis stable I see no indication for escalating the medicine.  EKG 06/26/16: NSR with sinus arrhythmia, low voltage QRS, possible anterolateral infarct (age undetermined).  Echo 07/14/15: Study Conclusions - Left ventricle: The cavity size was normal. There was moderate   concentric hypertrophy. Systolic function was mildly reduced. The   estimated ejection fraction was in the range of 45% to 50%. Wall   motion was normal; there were no regional wall motion   abnormalities. - Aortic valve: Trileaflet; mildly  thickened, mildly calcified   leaflets. - Aorta: The aorta was moderately calcified. - Mitral valve: Severely calcified annulus. Severely thickened,   severely calcified leaflets . There was mild regurgitation. - Left atrium: The atrium was moderately dilated. - Right ventricle: The cavity size was mildly dilated. Wall   thickness was normal. - Tricuspid valve: There was severe regurgitation. - Pulmonic valve: There was moderate regurgitation. - Pulmonary arteries: Systolic pressure was severely increased. PA   peak pressure: 86 mm Hg (S). - Pericardium, extracardiac: A small pericardial effusion was   identified circumferential to the heart. There was no evidence of   hemodynamic compromise. Impressions: - EF has mildly improved when compared to prior. (Comparison echo 05/13/13: EF 35-40%, moderate MR, severe TR, PA peak pressure 107 mmHg (S).)  RHC 01/29/14 (Indication:  Severe pulmonary hypertension by echo measurement):            Procedural Findings: Hemodynamics (mmHg) RA mean 1 RV 52/1 PA 52/20, mean 30 PCWP mean 6 Oxygen saturations: PA 70% AO 95% Cardiac Output (Fick) 5.77  Cardiac Index (Fick) 3.32 PVR 4.2 WU Cardiac Output (Thermo) 5.12 Cardiac Index (Thermo) 2.94   PVR 4.7 WU Final Conclusions:  Normal right and left heart filling pressures.  Normal cardiac output.  Moderate pulmonary arterial hypertension.  Treatment per Dr Lake Bells.  We would be glad to see her in clinic as well as needed, will wait for direction from Dr Lake Bells.   LHC 02/22/11: Coronary angiography: Coronary dominance: Right  Left Main:  Normal in size with mild 10-20% distal stenosis.  Left Anterior Descending (LAD):  Mildly calcified with mild diffuse atherosclerosis but no evidence of obstructive disease.  1st diagonal (D1):  Normal in size with 30% proximal disease.  2nd diagonal (D2):  Small in size.  3rd diagonal (D3):  Small in size.  Circumflex (LCx):  Normal in size and mildly  calcified. There is mild diffuse atherosclerosis but no evidence of obstructive disease.  1st obtuse marginal:  Very small in size.  2nd obtuse marginal:  Medium in size with minor irregularities.  3rd obtuse marginal:  Normal in size and free of significant disease.       Right Coronary Artery: The vessel is normal in size and appears to be dominant. An ostial stent is noted which appears to be protruding into the aorta at least a few millimeters. This made engaging the vessel selectively almost impossible. I was able to get nonselective shots with an AR-1 catheter. This showed that the vessel is patent with no significant in-stent restenosis. There seems to be a 30- 40% stenosis distal to the stent but otherwise no evidence of obstructive disease.  Left ventriculography: Left ventricular systolic function is moderately reduced , LVEF is estimated at 35-40 %. There is akinesis of the distal anterior, apex and distal inferior wall. The appearance is suggestive of stress-induced cardiomyopathy. Final Conclusions:   1. Patent RCA stent with no evidence of obstructive coronary artery disease. 2. Moderately reduced LV systolic function with an estimated ejection fraction 35-40% with an appearance suggestive of stress-induced cardiomyopathy. 3. Low left ventricular end-diastolic pressure.  Recommendations: The patient does not have evidence of obstructive coronary artery disease to explain her ECG changes. Her ECG changes and left ventricular angiography are suggestive of stress-induced cardiomyopathy. However, given her continued mental status changes, a CT scan of the head is recommended to rule out intracranial bleed as a cause of her current presentation. Continue treatment with Coreg and consider an echocardiogram in few days.   Carotid U/S 01/11/15: Irregular calcific plaque noted in the bilateral ICAs and ECAs with velocities consistent with a 1-39% diameter reduction. Due to calcific plaque  shadowing plaque may be greater than velocities suggest.    PFTs 04/08/13: FVC 1.87 (81%), FEV1 1.44 (80%), DLCO unc 10.95 (47%).  Preoperative labs noted. Patient known ESRD. She will need an ISTAT4 on the day of surgery. No UA done at PAT due to inability for patient to provide specimen.   As of 07/06/16, Dr. Percival Spanish recommended a stress test, so at this point surgery is going to be postponed. Anesthesiologist Dr. Marcie Bal also recommended pulmonology clearance given patient's history of pulmonary hypertension. I have sent a staff message to Michaele Offer, RN at [facility] communicating this.  George Hugh [facility] Short Stay Center/Anesthesiology Phone [phone] 07/07/2016 1:18 PM

## 2016-07-10 ENCOUNTER — Telehealth: Payer: Self-pay | Admitting: Cardiology

## 2016-07-10 DIAGNOSIS — E119 Type 2 diabetes mellitus without complications: Secondary | ICD-10-CM | POA: Diagnosis not present

## 2016-07-10 DIAGNOSIS — N2581 Secondary hyperparathyroidism of renal origin: Secondary | ICD-10-CM | POA: Diagnosis not present

## 2016-07-10 DIAGNOSIS — D509 Iron deficiency anemia, unspecified: Secondary | ICD-10-CM | POA: Diagnosis not present

## 2016-07-10 DIAGNOSIS — D631 Anemia in chronic kidney disease: Secondary | ICD-10-CM | POA: Diagnosis not present

## 2016-07-10 DIAGNOSIS — N186 End stage renal disease: Secondary | ICD-10-CM | POA: Diagnosis not present

## 2016-07-10 NOTE — Telephone Encounter (Signed)
Sharyn Lull returning call - she can be reached at [phone] -pr

## 2016-07-10 NOTE — Telephone Encounter (Signed)
S.w pt she will hold carvedilol

## 2016-07-10 NOTE — Telephone Encounter (Signed)
Spoke with Sharyn Lull. Pt has been scheduled with TP 07/12/16 at 1115. Nothing else needed at time of call.

## 2016-07-10 NOTE — Telephone Encounter (Signed)
lmom tcb x1 to [facility]

## 2016-07-10 NOTE — Telephone Encounter (Signed)
New message    Pt is calling stating she is returning call to RN from last week.

## 2016-07-11 ENCOUNTER — Inpatient Hospital Stay (HOSPITAL_COMMUNITY): Admission: RE | Admit: 2016-07-11 | Payer: Medicare Other | Source: Ambulatory Visit | Admitting: Vascular Surgery

## 2016-07-11 ENCOUNTER — Ambulatory Visit (HOSPITAL_COMMUNITY)
Admission: RE | Admit: 2016-07-11 | Discharge: 2016-07-11 | Disposition: A | Discharge status: Home / Self Care | Payer: Medicare Other | Source: Ambulatory Visit | Attending: Cardiovascular Disease | Admitting: Cardiovascular Disease

## 2016-07-11 ENCOUNTER — Encounter (HOSPITAL_COMMUNITY): Admission: RE | Payer: Self-pay | Source: Ambulatory Visit

## 2016-07-11 ENCOUNTER — Telehealth: Payer: Self-pay | Admitting: Adult Health

## 2016-07-11 ENCOUNTER — Other Ambulatory Visit: Payer: Self-pay | Admitting: *Deleted

## 2016-07-11 DIAGNOSIS — Z01810 Encounter for preprocedural cardiovascular examination: Secondary | ICD-10-CM

## 2016-07-11 DIAGNOSIS — I2510 Atherosclerotic heart disease of native coronary artery without angina pectoris: Secondary | ICD-10-CM | POA: Diagnosis not present

## 2016-07-11 LAB — MYOCARDIAL PERFUSION IMAGING
CHL CUP NUCLEAR SDS: 4
CHL CUP NUCLEAR SRS: 4
CHL CUP NUCLEAR SSS: 8
LVDIAVOL: 143 mL (ref 46–106)
LVSYSVOL: 82 mL
NUC STRESS TID: 0.98
Peak HR: 81 {beats}/min
Rest HR: 57 {beats}/min

## 2016-07-11 SURGERY — BYPASS GRAFT FEMORAL-POPLITEAL ARTERY
Anesthesia: General | Site: Leg Upper | Laterality: Left

## 2016-07-11 MED ORDER — TECHNETIUM TC 99M TETROFOSMIN IV KIT
30.1000 | PACK | Freq: Once | INTRAVENOUS | Status: AC | PRN
  Administered 2016-07-11: 30.1 via INTRAVENOUS
  Filled 2016-07-11: qty 31

## 2016-07-11 MED ORDER — REGADENOSON 0.4 MG/5ML IV SOLN
0.4000 mg | Freq: Once | INTRAVENOUS | Status: AC
  Administered 2016-07-11: 0.4 mg via INTRAVENOUS

## 2016-07-11 MED ORDER — TECHNETIUM TC 99M TETROFOSMIN IV KIT
10.0000 | PACK | Freq: Once | INTRAVENOUS | Status: AC | PRN
  Administered 2016-07-11: 10 via INTRAVENOUS
  Filled 2016-07-11: qty 10

## 2016-07-11 MED ORDER — ISOSORBIDE DINITRATE 20 MG PO TABS: 20.0000 mg | ORAL_TABLET | Freq: Every day | ORAL | 3 refills | Status: DC

## 2016-07-11 NOTE — Telephone Encounter (Signed)
Left message for Michele to call back 

## 2016-07-11 NOTE — Telephone Encounter (Signed)
Received request (walk-in) for refill on isosorbide-rx sent to preferred pharmacy.

## 2016-07-12 ENCOUNTER — Telehealth: Payer: Self-pay | Admitting: Vascular Surgery

## 2016-07-12 ENCOUNTER — Ambulatory Visit: Payer: Medicare Other | Admitting: Adult Health

## 2016-07-12 DIAGNOSIS — D631 Anemia in chronic kidney disease: Secondary | ICD-10-CM | POA: Diagnosis not present

## 2016-07-12 DIAGNOSIS — N186 End stage renal disease: Secondary | ICD-10-CM | POA: Diagnosis not present

## 2016-07-12 DIAGNOSIS — D509 Iron deficiency anemia, unspecified: Secondary | ICD-10-CM | POA: Diagnosis not present

## 2016-07-12 DIAGNOSIS — N2581 Secondary hyperparathyroidism of renal origin: Secondary | ICD-10-CM | POA: Diagnosis not present

## 2016-07-12 DIAGNOSIS — E119 Type 2 diabetes mellitus without complications: Secondary | ICD-10-CM | POA: Diagnosis not present

## 2016-07-12 NOTE — Telephone Encounter (Signed)
-----   Message from Denman George, RN sent at 07/07/2016  2:07 PM EDT ----- Regarding: needs Pulmonary appt./ clearance  Can you arrange an appt. With Dr. Lake Bells for pt. To have pre-op evaluation from Pulmonary standpoint?  I canceled her (L) Fem-Pop BP on 7/10, due to need of Cardiac Clear.  Now, Anesthesia is requesting Pulmonary Clearance.  Thank you.      ----- Message ----- From: Jacinta Shoe, PA-C Sent: 07/07/2016   1:13 PM To: Denman George, RN Subject: RE: clearance?                                 I did review with Dr. Marcie Bal. He recommended that pulmonology clearance be obtained as well from Dr. Lake Bells given her history of pulmonary hypertension history (severe by echo, mod-severe by cath). Could you send a request over to his office as well?  Ebony Hail  ----- Message ----- From: Denman George, RN Sent: 07/07/2016  11:52 AM To: Jacinta Shoe, PA-C, # Subject: RE: clearance?                                 I had sent a clearance form to Dr. Rosezella Florida office.  I just spoke with Dr. Rosezella Florida nurse, Hildred Alamin.  She said they will try to do a Lexiscan early next week, pending contacting the pt.  We will cancel the case at this time, until Cardiac Clearance is given.  Thanks.    ----- Message ----- From: Rutherford Limerick Sent: 07/07/2016   9:55 AM To: Mena Goes, RN, Joline Salt Pullins, RN Subject: clearance?                                     Arbie Cookey or Graceann Congress Gille is scheduled for [facility] on 7/10 with Dr. Bridgett Larsson. I saw what looked like two requests for cardiac "clearance" (6/12 just addressed ASA, but his notation for 7/05 indicated he wanted a stress test).  What do you know about this? Clearance status will need to be clarified. I'm also going to review with anesthesiologist due to her history of pulmonary hypertension. (She is seeing Dr. Lake Bells and in on medication. I looked like she was stable from 02/2016 visit.)  George Hugh [facility] Short Stay  Center/Anesthesiology Phone [phone] 07/07/2016 9:59 AM

## 2016-07-12 NOTE — Telephone Encounter (Signed)
Sched appt with Dr. Lake Bells 07/13/16 at 11:45. Spoke to pt.

## 2016-07-12 NOTE — Telephone Encounter (Signed)
Called and lm to have michelle call back about this pt.

## 2016-07-12 NOTE — Telephone Encounter (Signed)
Sharyn Lull returning call - she can be reached at [phone] -pr

## 2016-07-12 NOTE — Telephone Encounter (Signed)
Spoke with Sharyn Lull from [facility] called and stated pt has dialysis on Mon,Wed,and Fri and so patient is unable to come to her appt with Tammy today. I saw BQ had an opening tomorrow morning and she wrote down that appt and stated she will call the patient and make her aware. She states patient's surgery is 7/27 and she is having bypass surgery on her left leg.  She at this time had no further questions. Nothing further is needed

## 2016-07-13 ENCOUNTER — Encounter: Payer: Self-pay | Admitting: Pulmonary Disease

## 2016-07-13 ENCOUNTER — Ambulatory Visit (INDEPENDENT_AMBULATORY_CARE_PROVIDER_SITE_OTHER): Payer: Medicare Other | Admitting: Pulmonary Disease

## 2016-07-13 VITALS — BP 128/66 | HR 72 | Ht 62.5 in | Wt 173.2 lb

## 2016-07-13 DIAGNOSIS — I779 Disorder of arteries and arterioles, unspecified: Secondary | ICD-10-CM | POA: Diagnosis not present

## 2016-07-13 DIAGNOSIS — I272 Pulmonary hypertension, unspecified: Secondary | ICD-10-CM

## 2016-07-13 NOTE — Patient Instructions (Signed)
For your pulmonary hypertension: Keep taking Letairis as you're doing We will arrange for a 6 minute walk test From my standpoint it is fine for you to undergo general anesthesia.  We'll see you back in 6 months or sooner if needed

## 2016-07-13 NOTE — Progress Notes (Signed)
Subjective:    Patient ID: [name], female    DOB: Mar 17, 1945, 71 y.o.   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 ESRD, CHF   HPI  Chief Complaint  Patient presents with  . Follow-up    Pt here for surgery clearance for bypass graft of left leg artery, pt unsure what artery. Pt denies SOB, cough, CP/tightness, f/c/s. Pt states she has dialsysis monday, wednesday, and friday.    [name] has been doing well.  She is supposed to see [place] for clearance for her peripheral vascular disease. She is staying away from the cigarettes.   She continues to go to dialysis daily.   She continues to take Letairis and has no problems from this. She reports no dizziness, early satiety or dyspnea of any kind. Her functional status is okay, she is not having any shortness of breath when she exerts herself. No leg swelling recently. No chest pain. No syncope.  Past Medical History:  Diagnosis Date  . Anemia    History  of Blood transfusion  . CAD (coronary artery disease)    stent to RCA  . Cancer ([place])    clear cell cancer, kidney  . Chronic renal insufficiency    On hemodialysis  . Complication of anesthesia 12/2010   pt is very confused, with AMS with anesthesia  . CVA 07/27/2008   CVA affected cognition and memory per family, no focal deficits.    . CVA (cerebral infarction) 2003   no apparent residual  . Diastolic congestive heart failure ([place])   . Dyslipidemia   . Encephalopathy   . ESRD 07/27/2008   ESRD due to HTN and NSAID's, started hemodialysis in 2005 in [place], [place]. Went to [facility] from 2010 to 2012 and since 2012 has been getting dialysis at [facility] on [address] in [place] on a MWF schedule. First access with RUA AVG placed in [place]. Next and current access was LUA AVG placed by Dr. Lucky Cowboy in [place] in or around 2012. Has had 2 or 3 procedures on that graft since placed per family. She  gets her access work done here in [place] now. She is allergic to heparin and does not get any heparin at dialysis; she had an allergic reaction apparently when in ICU in the past.      . GERD (gastroesophageal reflux disease)   . Hyperlipidemia   . Hypertension   . Hypothyroidism   . Positive PPD    completed rifampin  . Pulmonary hypertension ([place])   . PVD (peripheral vascular disease) ([place])   . Seizures ([place])    last seizure 6 years ago  . Sleep apnea    Sleep Study 2008     Review of Systems  Constitutional: Negative for chills, fatigue and fever.  HENT: Negative for rhinorrhea, sinus pressure and sneezing.   Respiratory: Negative for cough, shortness of breath and wheezing.   Cardiovascular: Negative for chest pain, palpitations and leg swelling.       Objective:   Physical Exam  Vitals:   07/13/16 1157  BP: 128/66  Pulse: 72  SpO2: 97%  Weight: 173 lb 3.2 oz (78.6 kg)  Height: 5' 2.5" (1.588 m)   RA  Gen: well appearing HENT: OP clear, TM's clear, neck supple PULM: CTA B, normal percussion CV: RRR, no mgr, trace edema GI: BS+, soft, nontender Derm: no cyanosis or rash Psyche: normal mood and affect  PFT: 04/08/2013 full pulmonary function test> ratio 77%, FEV1 1.44 L (80% predicted), total lung capacity 3.55 (72% predicted), ERV 0.23 L (129% predicted), DLCO 10.95 (47% predicted)  Echo: 05/2013 Echo> LVEF 35-40% (diffuse global hypokinesis), RV severely dilated, RVSP 137mHg, RA dilated  6MW: 05/2013 6MW> 240 meters, O2 saturaiton 99% exercise 05/2014 6MW> 325m (98% RA)  Chest imaging: 05/2013 CT chest > no ILD, definite heart failure and CAD  RHC 1/26 RHC > RA mean 1 RV 52/1 PA 52/20, mean 30 PCWP mean 6  Oxygen saturations: PA 70% AO 95%  Cardiac Output (Fick) 5.77  Cardiac Index (Fick) 3.32 PVR 4.2 WU  Cardiac Output (Thermo) 5.12 Cardiac Index (Thermo) 2.94 PVR 4.7 WU  Labs: 03/2014 ANA neg, HIV neg, SCL-70 neg, RNP neg, B2 glycoprotein neg, DS  DNA neg  Liver function testing from last week normal  Medical record reviewed: Cardiology has advised a Lexiscan Myoview      Assessment & Plan:   Pulmonary hypertension ([place]) - Plan: 6 minute walk   Discussion: This is been a stable interval for [facility]. We know that her echocardiogram tends to overestimate her actual pulmonary pressures. She remains quite functional and reports no symptoms on very low doses of Letairis. So given her stability I see no reason to change her medications or consider a further workup prior to general anesthesia. I agree with cardiology that she should have a [place] performed.  She needs to have a 6 minute walk test performed as an objective measurement of her functional status in the setting of pulmonary hypertension.  Plan: For your pulmonary hypertension: Keep taking Letairis as you're doing We will arrange for a 6 minute walk test From my standpoint it is fine for you to undergo general anesthesia.  We'll see you back in 6 months or sooner if needed  Updated Medication List Outpatient Encounter Prescriptions as of 07/13/2016  Medication Sig  . acetaminophen (TYLENOL) 500 MG tablet Take 1,000 mg by mouth daily as needed for mild pain.   [name] ambrisentan (LETAIRIS) 5 MG tablet TAKE 1 TABLET (5 MG) ORALLY DAILY. (Patient taking differently: Take 5 mg by mouth daily. TAKE 1 TABLET (5 MG) ORALLY DAILY.)  . amLODipine (NORVASC) 10 MG tablet Take 10 mg by mouth at bedtime.   [name] aspirin EC 81 MG tablet Take 81 mg by mouth daily.  . carvedilol (COREG) 25 MG tablet Take 25 mg by mouth 2 (two) times daily with a meal.  . cilostazol (PLETAL) 100 MG tablet Take 1 tablet (100 mg total) by mouth 2 (two) times daily before a meal.  . diphenhydrAMINE (BENADRYL) 50 MG capsule Take 50 mg by mouth on 12/24/14, at 6:30 am. (Patient taking differently: Take 50 mg by mouth every Monday, Wednesday, and Friday. On dialysis days)  . folic acid-vitamin b complex-vitamin  c-selenium-zinc (DIALYVITE) 3 MG TABS tablet Take 1 tablet by mouth daily.  . hydrALAZINE (APRESOLINE) 25 MG tablet Take 25 mg by mouth 3 (three) times daily.  . isosorbide dinitrate (ISORDIL) 20 MG tablet Take 1 tablet (20 mg total) by mouth daily.  [name] levETIRAcetam (KEPPRA) 500 MG tablet Take 500 mg by mouth daily at 8 pm.  . levothyroxine (SYNTHROID, LEVOTHROID) 150 MCG tablet Take 150 mcg by mouth daily before breakfast.  . omeprazole (PRILOSEC OTC) 20 MG tablet Take 20 mg by mouth daily.  . pravastatin (PRAVACHOL) 40 MG tablet Take 1 tablet (40 mg total) by mouth at bedtime.  . SENSIPAR 90 MG tablet  Take 90 mg by mouth at bedtime.   . sevelamer carbonate (RENVELA) 800 MG tablet Take 1,600-3,200 mg by mouth See admin instructions. 4 tabs TID with each meal, 2 tabs with each snack.  . traMADol (ULTRAM) 50 MG tablet Take 50 mg by mouth every 12 (twelve) hours as needed for moderate pain.   . traZODone (DESYREL) 50 MG tablet Take 50 mg by mouth at bedtime as needed for sleep.   [name] albuterol (PROVENTIL HFA;VENTOLIN HFA) 108 (90 Base) MCG/ACT inhaler Inhale 1-2 puffs into the lungs every 6 (six) hours as needed for wheezing or shortness of breath. (Patient not taking: Reported on 07/13/2016)  . nitroGLYCERIN (NITROSTAT) 0.4 MG SL tablet PLACE 1 TABLET (0.4 MG TOTAL) UNDER THE TONGUE EVERY 5 (FIVE) MINUTES AS NEEDED FOR CHEST PAIN. (Patient not taking: Reported on 07/13/2016)   No facility-administered encounter medications on file as of 07/13/2016.

## 2016-07-14 DIAGNOSIS — D509 Iron deficiency anemia, unspecified: Secondary | ICD-10-CM | POA: Diagnosis not present

## 2016-07-14 DIAGNOSIS — N186 End stage renal disease: Secondary | ICD-10-CM | POA: Diagnosis not present

## 2016-07-14 DIAGNOSIS — D631 Anemia in chronic kidney disease: Secondary | ICD-10-CM | POA: Diagnosis not present

## 2016-07-14 DIAGNOSIS — N2581 Secondary hyperparathyroidism of renal origin: Secondary | ICD-10-CM | POA: Diagnosis not present

## 2016-07-14 DIAGNOSIS — E119 Type 2 diabetes mellitus without complications: Secondary | ICD-10-CM | POA: Diagnosis not present

## 2016-07-17 DIAGNOSIS — D509 Iron deficiency anemia, unspecified: Secondary | ICD-10-CM | POA: Diagnosis not present

## 2016-07-17 DIAGNOSIS — D631 Anemia in chronic kidney disease: Secondary | ICD-10-CM | POA: Diagnosis not present

## 2016-07-17 DIAGNOSIS — N2581 Secondary hyperparathyroidism of renal origin: Secondary | ICD-10-CM | POA: Diagnosis not present

## 2016-07-17 DIAGNOSIS — E119 Type 2 diabetes mellitus without complications: Secondary | ICD-10-CM | POA: Diagnosis not present

## 2016-07-17 DIAGNOSIS — N186 End stage renal disease: Secondary | ICD-10-CM | POA: Diagnosis not present

## 2016-07-19 ENCOUNTER — Other Ambulatory Visit: Payer: Self-pay

## 2016-07-19 DIAGNOSIS — D509 Iron deficiency anemia, unspecified: Secondary | ICD-10-CM | POA: Diagnosis not present

## 2016-07-19 DIAGNOSIS — E119 Type 2 diabetes mellitus without complications: Secondary | ICD-10-CM | POA: Diagnosis not present

## 2016-07-19 DIAGNOSIS — N2581 Secondary hyperparathyroidism of renal origin: Secondary | ICD-10-CM | POA: Diagnosis not present

## 2016-07-19 DIAGNOSIS — D631 Anemia in chronic kidney disease: Secondary | ICD-10-CM | POA: Diagnosis not present

## 2016-07-19 DIAGNOSIS — N186 End stage renal disease: Secondary | ICD-10-CM | POA: Diagnosis not present

## 2016-07-20 ENCOUNTER — Telehealth: Payer: Self-pay | Admitting: Cardiology

## 2016-07-20 NOTE — Telephone Encounter (Signed)
Nya off work today.  I called patient and reported findings/clearance for procedure. Pt expressed understanding and thanks.

## 2016-07-20 NOTE — Telephone Encounter (Signed)
Pt returning [name]'s call from yesterday,if she is not there,please leave a detailed message.She thinks it might have been about her test results.

## 2016-07-20 NOTE — Pre-Procedure Instructions (Signed)
Crystalynn Mcinerney Dils  07/20/2016      [id number] [place], [place] [address] [place] [address] Phone: [phone] Fax: [phone]  [place], [place] - [address] [place] [address] Phone: [phone] Fax: [phone]  [facility] [place] [place] [facility] [address] [place] [address] Phone: [phone] Fax: [place], [address] [place] [address] Phone: [phone] Fax: [phone]    Your procedure is scheduled on July 24  Report to [place] at [place].M.  Call this number if you have problems the morning of surgery:  [phone]   Remember:  Do not eat food or drink liquids after midnight.   Take these medicines the morning of surgery with A SIP OF WATER acetaminophen (TYLENOL) , albuterol (PROVENTIL HFA;VENTOLIN HFA) , carvedilol (COREG), hydrALAZINE (APRESOLINE), isosorbide dinitrate (ISORDIL),levothyroxine (SYNTHROID, LEVOTHROID) , omeprazole (PRILOSEC OTC) ,  traMADol (ULTRAM)  7 days prior to surgery STOP taking any Aspirin, Aleve, Naproxen, Ibuprofen, Motrin, Advil, Goody's, BC's, all herbal medications, fish oil, and all vitamins    Do not wear jewelry, make-up or nail polish.  Do not wear lotions, powders, or perfumes, or deoderant.  Do not shave 48 hours prior to surgery.  Men may shave face and neck.  Do not bring valuables to the hospital.  [facility] is not responsible for any belongings or valuables.  Contacts, dentures or bridgework may not be worn into surgery.  Leave your suitcase in the car.  After surgery it may be brought to your room.  For patients admitted to the hospital, discharge time will be determined  by your treatment team.  Patients discharged the day of surgery will not be allowed to drive home.    Special instructions:   [place]- Preparing For Surgery  Before surgery, you can play an important role. Because skin is not sterile, your skin needs to be as free of germs as possible. You can reduce the number of germs on your skin by washing with CHG (chlorahexidine gluconate) Soap before surgery.  CHG is an antiseptic cleaner which kills germs and bonds with the skin to continue killing germs even after washing.  Please do not use if you have an allergy to CHG or antibacterial soaps. If your skin becomes reddened/irritated stop using the CHG.  Do not shave (including legs and underarms) for at least 48 hours prior to first CHG shower. It is OK to shave your face.  Please follow these instructions carefully.   1. Shower the NIGHT BEFORE SURGERY and the MORNING OF SURGERY with CHG.   2. If you chose to wash your hair, wash your hair first as usual with your normal shampoo.  3. After you shampoo, rinse your hair and body thoroughly to remove the shampoo.  4. Use CHG as you would any other liquid soap. You can apply CHG directly to the skin and wash gently with a scrungie or a clean washcloth.   5. Apply the CHG Soap to your body ONLY FROM THE NECK DOWN.  Do not use on open wounds or open sores. Avoid contact with your eyes, ears, mouth and genitals (private parts). Wash genitals (private parts) with your normal soap.  6. Wash thoroughly, paying special attention to the area where your surgery will be performed.  7. Thoroughly rinse your body with warm water from the neck down.  8. DO NOT shower/wash with your normal soap after using and rinsing off the CHG Soap.  9. Pat yourself dry with a CLEAN TOWEL.   10. Wear CLEAN PAJAMAS   11. Place CLEAN SHEETS on your bed the night of your first shower and DO NOT SLEEP WITH PETS.    Day of Surgery: Do not apply any  deodorants/lotions. Please wear clean clothes to the hospital/surgery center.      Please read over the following fact sheets that you were given.

## 2016-07-20 NOTE — Telephone Encounter (Signed)
Notes recorded by Vennie Homans on 07/19/2016 at 10:26 AM EDT Leave message for pt to call back ------  Notes recorded by Minus Breeding, MD on 07/13/2016 at 4:36 AM EDT The study does demonstrate an inferior scar but was a low risk study with no other areas of ischemia. Therefore, based on ACC/AHA guidelines, the patient would be at acceptable risk for the planned procedure without further cardiovascular testing. Please send to surgery for preop clearance. Call Ms. Accardo with the results and send results to Glendale Chard, MD

## 2016-07-21 ENCOUNTER — Encounter (HOSPITAL_COMMUNITY)
Admission: RE | Admit: 2016-07-21 | Discharge: 2016-07-21 | Disposition: A | Discharge status: Home / Self Care | Payer: Medicare Other | Source: Ambulatory Visit | Attending: Vascular Surgery | Admitting: Vascular Surgery

## 2016-07-21 ENCOUNTER — Encounter (HOSPITAL_COMMUNITY): Payer: Self-pay

## 2016-07-21 DIAGNOSIS — Z01812 Encounter for preprocedural laboratory examination: Secondary | ICD-10-CM | POA: Insufficient documentation

## 2016-07-21 DIAGNOSIS — D631 Anemia in chronic kidney disease: Secondary | ICD-10-CM | POA: Diagnosis not present

## 2016-07-21 DIAGNOSIS — D509 Iron deficiency anemia, unspecified: Secondary | ICD-10-CM | POA: Diagnosis not present

## 2016-07-21 DIAGNOSIS — N2581 Secondary hyperparathyroidism of renal origin: Secondary | ICD-10-CM | POA: Diagnosis not present

## 2016-07-21 DIAGNOSIS — N186 End stage renal disease: Secondary | ICD-10-CM | POA: Diagnosis not present

## 2016-07-21 DIAGNOSIS — E119 Type 2 diabetes mellitus without complications: Secondary | ICD-10-CM | POA: Diagnosis not present

## 2016-07-21 LAB — COMPREHENSIVE METABOLIC PANEL
ALBUMIN: 3.3 g/dL — AB (ref 3.5–5.0)
ALT: 11 U/L — AB (ref 14–54)
AST: 16 U/L (ref 15–41)
Alkaline Phosphatase: 88 U/L (ref 38–126)
Anion gap: 11 (ref 5–15)
BUN: 20 mg/dL (ref 6–20)
CHLORIDE: 94 mmol/L — AB (ref 101–111)
CO2: 32 mmol/L (ref 22–32)
CREATININE: 5.93 mg/dL — AB (ref 0.44–1.00)
Calcium: 8.4 mg/dL — ABNORMAL LOW (ref 8.9–10.3)
GFR calc Af Amer: 7 mL/min — ABNORMAL LOW (ref >60)
GFR calc non Af Amer: 6 mL/min — ABNORMAL LOW (ref >60)
GLUCOSE: 109 mg/dL — AB (ref 65–99)
POTASSIUM: 5 mmol/L (ref 3.5–5.1)
Sodium: 137 mmol/L (ref 135–145)
Total Bilirubin: 0.4 mg/dL (ref 0.3–1.2)
Total Protein: 6.1 g/dL — ABNORMAL LOW (ref 6.5–8.1)

## 2016-07-21 LAB — CBC
HEMATOCRIT: 31.9 % — AB (ref 36.0–46.0)
Hemoglobin: 10.2 g/dL — ABNORMAL LOW (ref 12.0–15.0)
MCH: 27 pg (ref 26.0–34.0)
MCHC: 32 g/dL (ref 30.0–36.0)
MCV: 84.4 fL (ref 78.0–100.0)
PLATELETS: 155 10*3/uL (ref 150–400)
RBC: 3.78 MIL/uL — AB (ref 3.87–5.11)
RDW: 14.7 % (ref 11.5–15.5)
WBC: 4.1 10*3/uL (ref 4.0–10.5)

## 2016-07-21 LAB — PROTIME-INR
INR: 1.04
Prothrombin Time: 13.6 seconds (ref 11.4–15.2)

## 2016-07-21 LAB — TYPE AND SCREEN
ABO/RH(D): B POS
ANTIBODY SCREEN: NEGATIVE

## 2016-07-21 LAB — APTT: APTT: 32 s (ref 24–36)

## 2016-07-21 MED ORDER — CHLORHEXIDINE GLUCONATE 4 % EX LIQD: 60.0000 mL | Freq: Once | CUTANEOUS | Status: DC

## 2016-07-21 NOTE — Progress Notes (Signed)
PCP: Dr. Glendale Chard  Cardiologist: Dr. Percival Spanish  EKG: 06/2016  Stress test: 07/2016  ECHO:07/2015  Cardiac Cath:denies  Chest x-ray:

## 2016-07-24 DIAGNOSIS — D509 Iron deficiency anemia, unspecified: Secondary | ICD-10-CM | POA: Diagnosis not present

## 2016-07-24 DIAGNOSIS — E119 Type 2 diabetes mellitus without complications: Secondary | ICD-10-CM | POA: Diagnosis not present

## 2016-07-24 DIAGNOSIS — N2581 Secondary hyperparathyroidism of renal origin: Secondary | ICD-10-CM | POA: Diagnosis not present

## 2016-07-24 DIAGNOSIS — D631 Anemia in chronic kidney disease: Secondary | ICD-10-CM | POA: Diagnosis not present

## 2016-07-24 DIAGNOSIS — N186 End stage renal disease: Secondary | ICD-10-CM | POA: Diagnosis not present

## 2016-07-24 MED ORDER — VANCOMYCIN HCL IN DEXTROSE 1-5 GM/200ML-% IV SOLN
1000.0000 mg | INTRAVENOUS | Status: AC
  Administered 2016-07-25: 1000 mg via INTRAVENOUS
  Filled 2016-07-24: qty 200

## 2016-07-25 ENCOUNTER — Encounter (HOSPITAL_COMMUNITY): Payer: Self-pay | Admitting: *Deleted

## 2016-07-25 ENCOUNTER — Inpatient Hospital Stay (HOSPITAL_COMMUNITY)
Admission: RE | Admit: 2016-07-25 | Discharge: 2016-07-26 | DRG: 252 | Disposition: A | Discharge status: Home / Self Care | Payer: Medicare Other | Source: Ambulatory Visit | Attending: Vascular Surgery | Admitting: Vascular Surgery

## 2016-07-25 ENCOUNTER — Encounter (HOSPITAL_COMMUNITY)
Admission: RE | Disposition: A | Discharge status: Home / Self Care | Payer: Self-pay | Source: Ambulatory Visit | Attending: Vascular Surgery

## 2016-07-25 ENCOUNTER — Inpatient Hospital Stay (HOSPITAL_COMMUNITY): Payer: Medicare Other | Admitting: Registered Nurse

## 2016-07-25 DIAGNOSIS — Z885 Allergy status to narcotic agent status: Secondary | ICD-10-CM

## 2016-07-25 DIAGNOSIS — R7611 Nonspecific reaction to tuberculin skin test without active tuberculosis: Secondary | ICD-10-CM | POA: Diagnosis present

## 2016-07-25 DIAGNOSIS — Z9109 Other allergy status, other than to drugs and biological substances: Secondary | ICD-10-CM

## 2016-07-25 DIAGNOSIS — I69311 Memory deficit following cerebral infarction: Secondary | ICD-10-CM | POA: Diagnosis not present

## 2016-07-25 DIAGNOSIS — E11649 Type 2 diabetes mellitus with hypoglycemia without coma: Secondary | ICD-10-CM | POA: Diagnosis present

## 2016-07-25 DIAGNOSIS — I2510 Atherosclerotic heart disease of native coronary artery without angina pectoris: Secondary | ICD-10-CM | POA: Diagnosis present

## 2016-07-25 DIAGNOSIS — Z992 Dependence on renal dialysis: Secondary | ICD-10-CM

## 2016-07-25 DIAGNOSIS — D649 Anemia, unspecified: Secondary | ICD-10-CM | POA: Diagnosis present

## 2016-07-25 DIAGNOSIS — Z8553 Personal history of malignant neoplasm of renal pelvis: Secondary | ICD-10-CM

## 2016-07-25 DIAGNOSIS — Z905 Acquired absence of kidney: Secondary | ICD-10-CM | POA: Diagnosis not present

## 2016-07-25 DIAGNOSIS — I70211 Atherosclerosis of native arteries of extremities with intermittent claudication, right leg: Secondary | ICD-10-CM | POA: Diagnosis not present

## 2016-07-25 DIAGNOSIS — N2581 Secondary hyperparathyroidism of renal origin: Secondary | ICD-10-CM | POA: Diagnosis present

## 2016-07-25 DIAGNOSIS — E875 Hyperkalemia: Secondary | ICD-10-CM | POA: Diagnosis present

## 2016-07-25 DIAGNOSIS — G4730 Sleep apnea, unspecified: Secondary | ICD-10-CM | POA: Diagnosis present

## 2016-07-25 DIAGNOSIS — Z7982 Long term (current) use of aspirin: Secondary | ICD-10-CM

## 2016-07-25 DIAGNOSIS — E8889 Other specified metabolic disorders: Secondary | ICD-10-CM | POA: Diagnosis present

## 2016-07-25 DIAGNOSIS — D631 Anemia in chronic kidney disease: Secondary | ICD-10-CM | POA: Diagnosis not present

## 2016-07-25 DIAGNOSIS — K219 Gastro-esophageal reflux disease without esophagitis: Secondary | ICD-10-CM | POA: Diagnosis present

## 2016-07-25 DIAGNOSIS — N186 End stage renal disease: Secondary | ICD-10-CM | POA: Diagnosis not present

## 2016-07-25 DIAGNOSIS — E1122 Type 2 diabetes mellitus with diabetic chronic kidney disease: Secondary | ICD-10-CM | POA: Diagnosis present

## 2016-07-25 DIAGNOSIS — Z91041 Radiographic dye allergy status: Secondary | ICD-10-CM

## 2016-07-25 DIAGNOSIS — I272 Pulmonary hypertension, unspecified: Secondary | ICD-10-CM | POA: Diagnosis present

## 2016-07-25 DIAGNOSIS — Z880 Allergy status to penicillin: Secondary | ICD-10-CM

## 2016-07-25 DIAGNOSIS — Z888 Allergy status to other drugs, medicaments and biological substances status: Secondary | ICD-10-CM | POA: Diagnosis not present

## 2016-07-25 DIAGNOSIS — E1151 Type 2 diabetes mellitus with diabetic peripheral angiopathy without gangrene: Secondary | ICD-10-CM | POA: Diagnosis present

## 2016-07-25 DIAGNOSIS — Z85528 Personal history of other malignant neoplasm of kidney: Secondary | ICD-10-CM

## 2016-07-25 DIAGNOSIS — Z87891 Personal history of nicotine dependence: Secondary | ICD-10-CM | POA: Diagnosis not present

## 2016-07-25 DIAGNOSIS — E039 Hypothyroidism, unspecified: Secondary | ICD-10-CM | POA: Diagnosis not present

## 2016-07-25 DIAGNOSIS — I132 Hypertensive heart and chronic kidney disease with heart failure and with stage 5 chronic kidney disease, or end stage renal disease: Secondary | ICD-10-CM | POA: Diagnosis not present

## 2016-07-25 DIAGNOSIS — Z955 Presence of coronary angioplasty implant and graft: Secondary | ICD-10-CM

## 2016-07-25 DIAGNOSIS — I70213 Atherosclerosis of native arteries of extremities with intermittent claudication, bilateral legs: Secondary | ICD-10-CM | POA: Diagnosis not present

## 2016-07-25 DIAGNOSIS — I120 Hypertensive chronic kidney disease with stage 5 chronic kidney disease or end stage renal disease: Secondary | ICD-10-CM | POA: Diagnosis not present

## 2016-07-25 DIAGNOSIS — E785 Hyperlipidemia, unspecified: Secondary | ICD-10-CM | POA: Diagnosis present

## 2016-07-25 DIAGNOSIS — Z8673 Personal history of transient ischemic attack (TIA), and cerebral infarction without residual deficits: Secondary | ICD-10-CM

## 2016-07-25 DIAGNOSIS — Z8249 Family history of ischemic heart disease and other diseases of the circulatory system: Secondary | ICD-10-CM

## 2016-07-25 DIAGNOSIS — Z79899 Other long term (current) drug therapy: Secondary | ICD-10-CM

## 2016-07-25 DIAGNOSIS — Z90710 Acquired absence of both cervix and uterus: Secondary | ICD-10-CM

## 2016-07-25 DIAGNOSIS — I5032 Chronic diastolic (congestive) heart failure: Secondary | ICD-10-CM | POA: Diagnosis not present

## 2016-07-25 DIAGNOSIS — F329 Major depressive disorder, single episode, unspecified: Secondary | ICD-10-CM | POA: Diagnosis not present

## 2016-07-25 DIAGNOSIS — Z9049 Acquired absence of other specified parts of digestive tract: Secondary | ICD-10-CM

## 2016-07-25 DIAGNOSIS — Z538 Procedure and treatment not carried out for other reasons: Secondary | ICD-10-CM | POA: Diagnosis not present

## 2016-07-25 HISTORY — PX: ARTERY EXPLORATION: SHX5110

## 2016-07-25 LAB — POCT I-STAT 4, (NA,K, GLUC, HGB,HCT)
Glucose, Bld: 77 mg/dL (ref 65–99)
HEMATOCRIT: 30 % — AB (ref 36.0–46.0)
HEMOGLOBIN: 10.2 g/dL — AB (ref 12.0–15.0)
Potassium: 5 mmol/L (ref 3.5–5.1)
SODIUM: 134 mmol/L — AB (ref 135–145)

## 2016-07-25 SURGERY — EXPLORATION, ARTERY
Anesthesia: General | Site: Leg Upper | Laterality: Left

## 2016-07-25 MED ORDER — ALUM & MAG HYDROXIDE-SIMETH 200-200-20 MG/5ML PO SUSP: 15.0000 mL | ORAL | Status: DC | PRN

## 2016-07-25 MED ORDER — VANCOMYCIN HCL IN DEXTROSE 1-5 GM/200ML-% IV SOLN: 1000.0000 mg | Freq: Two times a day (BID) | INTRAVENOUS | Status: DC

## 2016-07-25 MED ORDER — LIDOCAINE 2% (20 MG/ML) 5 ML SYRINGE
INTRAMUSCULAR | Status: AC
  Filled 2016-07-25: qty 5

## 2016-07-25 MED ORDER — MAGNESIUM SULFATE 2 GM/50ML IV SOLN
2.0000 g | Freq: Every day | INTRAVENOUS | Status: DC | PRN
  Filled 2016-07-25: qty 50

## 2016-07-25 MED ORDER — ONDANSETRON HCL 4 MG/2ML IJ SOLN: 4.0000 mg | Freq: Four times a day (QID) | INTRAMUSCULAR | Status: DC | PRN

## 2016-07-25 MED ORDER — 0.9 % SODIUM CHLORIDE (POUR BTL) OPTIME
TOPICAL | Status: DC | PRN
  Administered 2016-07-25: 2000 mL

## 2016-07-25 MED ORDER — GUAIFENESIN-DM 100-10 MG/5ML PO SYRP: 15.0000 mL | ORAL_SOLUTION | ORAL | Status: DC | PRN

## 2016-07-25 MED ORDER — SODIUM CHLORIDE 0.9 % IV SOLN
INTRAVENOUS | Status: DC
  Filled 2016-07-25: qty 10

## 2016-07-25 MED ORDER — GLYCOPYRROLATE 0.2 MG/ML IJ SOLN
INTRAMUSCULAR | Status: DC | PRN
  Administered 2016-07-25: 0.4 mg via INTRAVENOUS

## 2016-07-25 MED ORDER — CARVEDILOL 25 MG PO TABS
25.0000 mg | ORAL_TABLET | Freq: Two times a day (BID) | ORAL | Status: DC
  Administered 2016-07-25: 25 mg via ORAL
  Filled 2016-07-25 (×3): qty 1

## 2016-07-25 MED ORDER — ASPIRIN EC 81 MG PO TBEC: 81.0000 mg | DELAYED_RELEASE_TABLET | Freq: Every day | ORAL | Status: DC

## 2016-07-25 MED ORDER — MIDAZOLAM HCL 2 MG/2ML IJ SOLN
INTRAMUSCULAR | Status: AC
  Filled 2016-07-25: qty 2

## 2016-07-25 MED ORDER — SODIUM CHLORIDE 0.9 % IV SOLN: 500.0000 mL | Freq: Once | INTRAVENOUS | Status: DC | PRN

## 2016-07-25 MED ORDER — HYDRALAZINE HCL 25 MG PO TABS
25.0000 mg | ORAL_TABLET | Freq: Three times a day (TID) | ORAL | Status: DC
  Administered 2016-07-25: 25 mg via ORAL
  Filled 2016-07-25: qty 1

## 2016-07-25 MED ORDER — METOPROLOL TARTRATE 5 MG/5ML IV SOLN: 2.0000 mg | INTRAVENOUS | Status: DC | PRN

## 2016-07-25 MED ORDER — SEVELAMER CARBONATE 800 MG PO TABS
3200.0000 mg | ORAL_TABLET | Freq: Three times a day (TID) | ORAL | Status: DC
  Administered 2016-07-25 – 2016-07-26 (×2): 3200 mg via ORAL
  Filled 2016-07-25 (×3): qty 4

## 2016-07-25 MED ORDER — LABETALOL HCL 5 MG/ML IV SOLN
INTRAVENOUS | Status: AC
  Administered 2016-07-25: 10 mg via INTRAVENOUS
  Filled 2016-07-25: qty 4

## 2016-07-25 MED ORDER — MIDAZOLAM HCL 2 MG/2ML IJ SOLN
INTRAMUSCULAR | Status: DC | PRN
  Administered 2016-07-25: 1 mg via INTRAVENOUS

## 2016-07-25 MED ORDER — SODIUM CHLORIDE 0.9 % IV SOLN: INTRAVENOUS | Status: DC

## 2016-07-25 MED ORDER — FENTANYL CITRATE (PF) 100 MCG/2ML IJ SOLN
25.0000 ug | INTRAMUSCULAR | Status: DC | PRN
  Administered 2016-07-25 (×2): 50 ug via INTRAVENOUS

## 2016-07-25 MED ORDER — ARGATROBAN 50 MG/50ML IV SOLN
0.5000 ug/kg/min | Freq: Once | INTRAVENOUS | Status: DC
  Filled 2016-07-25: qty 50

## 2016-07-25 MED ORDER — ISOSORBIDE DINITRATE 20 MG PO TABS
20.0000 mg | ORAL_TABLET | Freq: Every day | ORAL | Status: DC
  Filled 2016-07-25: qty 1

## 2016-07-25 MED ORDER — TRAZODONE HCL 50 MG PO TABS
50.0000 mg | ORAL_TABLET | Freq: Every evening | ORAL | Status: DC | PRN
  Filled 2016-07-25: qty 1

## 2016-07-25 MED ORDER — LEVOTHYROXINE SODIUM 150 MCG PO TABS
150.0000 ug | ORAL_TABLET | Freq: Every day | ORAL | Status: DC
  Administered 2016-07-26: 150 ug via ORAL
  Filled 2016-07-25: qty 2
  Filled 2016-07-25: qty 1

## 2016-07-25 MED ORDER — NITROGLYCERIN 0.4 MG SL SUBL: 0.4000 mg | SUBLINGUAL_TABLET | SUBLINGUAL | Status: DC | PRN

## 2016-07-25 MED ORDER — OXYCODONE-ACETAMINOPHEN 5-325 MG PO TABS
1.0000 | ORAL_TABLET | ORAL | Status: DC | PRN
  Administered 2016-07-25: 2 via ORAL
  Administered 2016-07-26: 1 via ORAL
  Filled 2016-07-25: qty 1

## 2016-07-25 MED ORDER — POTASSIUM CHLORIDE CRYS ER 20 MEQ PO TBCR: 20.0000 meq | EXTENDED_RELEASE_TABLET | Freq: Every day | ORAL | Status: DC | PRN

## 2016-07-25 MED ORDER — ALBUTEROL SULFATE (2.5 MG/3ML) 0.083% IN NEBU: 3.0000 mL | INHALATION_SOLUTION | Freq: Four times a day (QID) | RESPIRATORY_TRACT | Status: DC | PRN

## 2016-07-25 MED ORDER — PRAVASTATIN SODIUM 40 MG PO TABS
40.0000 mg | ORAL_TABLET | Freq: Every day | ORAL | Status: DC
  Administered 2016-07-25: 40 mg via ORAL
  Filled 2016-07-25: qty 1

## 2016-07-25 MED ORDER — LEVETIRACETAM 500 MG PO TABS
500.0000 mg | ORAL_TABLET | Freq: Every day | ORAL | Status: DC
  Administered 2016-07-25: 500 mg via ORAL
  Filled 2016-07-25: qty 1

## 2016-07-25 MED ORDER — ACETAMINOPHEN 500 MG PO TABS
1000.0000 mg | ORAL_TABLET | Freq: Every day | ORAL | Status: DC | PRN
  Administered 2016-07-26: 1000 mg via ORAL

## 2016-07-25 MED ORDER — SODIUM CHLORIDE 0.9 % IR SOLN
Status: DC | PRN
  Administered 2016-07-25: 500 mL

## 2016-07-25 MED ORDER — RENA-VITE PO TABS
1.0000 | ORAL_TABLET | Freq: Every day | ORAL | Status: DC
  Administered 2016-07-25: 1 via ORAL
  Filled 2016-07-25: qty 1

## 2016-07-25 MED ORDER — NEOSTIGMINE METHYLSULFATE 5 MG/5ML IV SOSY
PREFILLED_SYRINGE | INTRAVENOUS | Status: AC
  Filled 2016-07-25: qty 5

## 2016-07-25 MED ORDER — FENTANYL CITRATE (PF) 100 MCG/2ML IJ SOLN
INTRAMUSCULAR | Status: DC | PRN
  Administered 2016-07-25 (×4): 50 ug via INTRAVENOUS

## 2016-07-25 MED ORDER — LIDOCAINE 2% (20 MG/ML) 5 ML SYRINGE
INTRAMUSCULAR | Status: DC | PRN
  Administered 2016-07-25: 100 mg via INTRAVENOUS

## 2016-07-25 MED ORDER — DOCUSATE SODIUM 100 MG PO CAPS: 100.0000 mg | ORAL_CAPSULE | Freq: Every day | ORAL | Status: DC

## 2016-07-25 MED ORDER — TRAMADOL HCL 50 MG PO TABS
50.0000 mg | ORAL_TABLET | Freq: Two times a day (BID) | ORAL | Status: DC | PRN
  Administered 2016-07-25 – 2016-07-26 (×3): 50 mg via ORAL
  Filled 2016-07-25: qty 1

## 2016-07-25 MED ORDER — OXYCODONE-ACETAMINOPHEN 5-325 MG PO TABS
ORAL_TABLET | ORAL | Status: AC
  Filled 2016-07-25: qty 2

## 2016-07-25 MED ORDER — ONDANSETRON HCL 4 MG/2ML IJ SOLN
INTRAMUSCULAR | Status: AC
  Filled 2016-07-25: qty 2

## 2016-07-25 MED ORDER — SEVELAMER CARBONATE 800 MG PO TABS: 1600.0000 mg | ORAL_TABLET | ORAL | Status: DC | PRN

## 2016-07-25 MED ORDER — DEXAMETHASONE SODIUM PHOSPHATE 10 MG/ML IJ SOLN
INTRAMUSCULAR | Status: AC
  Filled 2016-07-25: qty 1

## 2016-07-25 MED ORDER — ONDANSETRON HCL 4 MG/2ML IJ SOLN
INTRAMUSCULAR | Status: DC | PRN
  Administered 2016-07-25: 4 mg via INTRAVENOUS

## 2016-07-25 MED ORDER — HYDROMORPHONE HCL 1 MG/ML IJ SOLN: 0.5000 mg | INTRAMUSCULAR | Status: DC | PRN

## 2016-07-25 MED ORDER — CILOSTAZOL 100 MG PO TABS
100.0000 mg | ORAL_TABLET | Freq: Two times a day (BID) | ORAL | Status: DC
  Filled 2016-07-25 (×4): qty 1

## 2016-07-25 MED ORDER — PROPOFOL 10 MG/ML IV BOLUS
INTRAVENOUS | Status: AC
  Filled 2016-07-25: qty 20

## 2016-07-25 MED ORDER — PHENYLEPHRINE HCL 10 MG/ML IJ SOLN
INTRAVENOUS | Status: DC | PRN
  Administered 2016-07-25: 25 ug/min via INTRAVENOUS

## 2016-07-25 MED ORDER — PHENYLEPHRINE 40 MCG/ML (10ML) SYRINGE FOR IV PUSH (FOR BLOOD PRESSURE SUPPORT)
PREFILLED_SYRINGE | INTRAVENOUS | Status: AC
  Filled 2016-07-25: qty 10

## 2016-07-25 MED ORDER — DIPHENHYDRAMINE HCL 25 MG PO CAPS
50.0000 mg | ORAL_CAPSULE | ORAL | Status: DC
  Administered 2016-07-26: 50 mg via ORAL

## 2016-07-25 MED ORDER — SODIUM CHLORIDE 0.9 % IV SOLN
INTRAVENOUS | Status: DC | PRN
  Administered 2016-07-25: 07:00:00 via INTRAVENOUS

## 2016-07-25 MED ORDER — LABETALOL HCL 5 MG/ML IV SOLN
INTRAVENOUS | Status: AC
  Filled 2016-07-25: qty 4

## 2016-07-25 MED ORDER — AMLODIPINE BESYLATE 10 MG PO TABS
10.0000 mg | ORAL_TABLET | Freq: Every day | ORAL | Status: DC
  Administered 2016-07-25: 10 mg via ORAL
  Filled 2016-07-25: qty 1

## 2016-07-25 MED ORDER — AMBRISENTAN 5 MG PO TABS
5.0000 mg | ORAL_TABLET | Freq: Every day | ORAL | Status: DC
  Filled 2016-07-25 (×2): qty 1

## 2016-07-25 MED ORDER — DIPHENHYDRAMINE HCL 50 MG/ML IJ SOLN
INTRAMUSCULAR | Status: AC
  Filled 2016-07-25: qty 1

## 2016-07-25 MED ORDER — OMEPRAZOLE MAGNESIUM 20 MG PO TBEC: 20.0000 mg | DELAYED_RELEASE_TABLET | Freq: Every day | ORAL | Status: DC

## 2016-07-25 MED ORDER — ROCURONIUM BROMIDE 10 MG/ML (PF) SYRINGE
PREFILLED_SYRINGE | INTRAVENOUS | Status: DC | PRN
  Administered 2016-07-25 (×2): 30 mg via INTRAVENOUS

## 2016-07-25 MED ORDER — NEOSTIGMINE METHYLSULFATE 10 MG/10ML IV SOLN
INTRAVENOUS | Status: DC | PRN
  Administered 2016-07-25: 3 mg via INTRAVENOUS

## 2016-07-25 MED ORDER — FENTANYL CITRATE (PF) 100 MCG/2ML IJ SOLN
INTRAMUSCULAR | Status: AC
  Administered 2016-07-25: 50 ug via INTRAVENOUS
  Filled 2016-07-25: qty 2

## 2016-07-25 MED ORDER — PROPOFOL 10 MG/ML IV BOLUS
INTRAVENOUS | Status: DC | PRN
  Administered 2016-07-25: 120 mg via INTRAVENOUS
  Administered 2016-07-25: 30 mg via INTRAVENOUS

## 2016-07-25 MED ORDER — PANTOPRAZOLE SODIUM 40 MG PO TBEC: 40.0000 mg | DELAYED_RELEASE_TABLET | Freq: Every day | ORAL | Status: DC

## 2016-07-25 MED ORDER — DIPHENHYDRAMINE HCL 50 MG/ML IJ SOLN
12.5000 mg | Freq: Once | INTRAMUSCULAR | Status: AC
  Administered 2016-07-25: 12.5 mg via INTRAVENOUS

## 2016-07-25 MED ORDER — LABETALOL HCL 5 MG/ML IV SOLN
10.0000 mg | INTRAVENOUS | Status: AC | PRN
  Administered 2016-07-25 – 2016-07-26 (×4): 10 mg via INTRAVENOUS
  Filled 2016-07-25: qty 4

## 2016-07-25 MED ORDER — TRAMADOL HCL 50 MG PO TABS
ORAL_TABLET | ORAL | Status: AC
  Administered 2016-07-26: 50 mg via ORAL
  Filled 2016-07-25: qty 1

## 2016-07-25 MED ORDER — ROCURONIUM BROMIDE 10 MG/ML (PF) SYRINGE
PREFILLED_SYRINGE | INTRAVENOUS | Status: AC
  Filled 2016-07-25: qty 5

## 2016-07-25 MED ORDER — HYDRALAZINE HCL 20 MG/ML IJ SOLN
5.0000 mg | INTRAMUSCULAR | Status: DC | PRN
  Administered 2016-07-26: 5 mg via INTRAVENOUS
  Filled 2016-07-25: qty 1

## 2016-07-25 MED ORDER — FENTANYL CITRATE (PF) 250 MCG/5ML IJ SOLN
INTRAMUSCULAR | Status: AC
  Filled 2016-07-25: qty 5

## 2016-07-25 MED ORDER — ONDANSETRON HCL 4 MG/2ML IJ SOLN: 4.0000 mg | Freq: Once | INTRAMUSCULAR | Status: DC | PRN

## 2016-07-25 MED ORDER — PHENYLEPHRINE 40 MCG/ML (10ML) SYRINGE FOR IV PUSH (FOR BLOOD PRESSURE SUPPORT)
PREFILLED_SYRINGE | INTRAVENOUS | Status: DC | PRN
  Administered 2016-07-25 (×2): 40 ug via INTRAVENOUS

## 2016-07-25 MED ORDER — DEXAMETHASONE SODIUM PHOSPHATE 10 MG/ML IJ SOLN
INTRAMUSCULAR | Status: DC | PRN
  Administered 2016-07-25: 10 mg via INTRAVENOUS

## 2016-07-25 MED ORDER — PHENOL 1.4 % MT LIQD: 1.0000 | OROMUCOSAL | Status: DC | PRN

## 2016-07-25 SURGICAL SUPPLY — 54 items
BANDAGE ACE 4X5 VEL STRL LF (GAUZE/BANDAGES/DRESSINGS) IMPLANT
BANDAGE ESMARK 6X9 LF (GAUZE/BANDAGES/DRESSINGS) IMPLANT
BNDG ESMARK 6X9 LF (GAUZE/BANDAGES/DRESSINGS)
CANISTER SUCT 3000ML PPV (MISCELLANEOUS) ×4 IMPLANT
CLIP TI MEDIUM 24 (CLIP) ×4 IMPLANT
CLIP TI WIDE RED SMALL 24 (CLIP) ×4 IMPLANT
COVER PROBE W GEL 5X96 (DRAPES) ×4 IMPLANT
CUFF TOURNIQUET SINGLE 24IN (TOURNIQUET CUFF) IMPLANT
CUFF TOURNIQUET SINGLE 34IN LL (TOURNIQUET CUFF) IMPLANT
CUFF TOURNIQUET SINGLE 44IN (TOURNIQUET CUFF) IMPLANT
DERMABOND ADVANCED (GAUZE/BANDAGES/DRESSINGS) ×4
DERMABOND ADVANCED .7 DNX12 (GAUZE/BANDAGES/DRESSINGS) ×4 IMPLANT
DRAIN CHANNEL 15F RND FF W/TCR (WOUND CARE) IMPLANT
DRAPE C-ARM 42X72 X-RAY (DRAPES) ×4 IMPLANT
DRAPE HALF SHEET 40X57 (DRAPES) IMPLANT
ELECT REM PT RETURN 9FT ADLT (ELECTROSURGICAL) ×4
ELECTRODE REM PT RTRN 9FT ADLT (ELECTROSURGICAL) ×2 IMPLANT
EVACUATOR SILICONE 100CC (DRAIN) IMPLANT
GLOVE BIO SURGEON STRL SZ7 (GLOVE) ×4 IMPLANT
GLOVE BIOGEL PI IND STRL 7.5 (GLOVE) ×2 IMPLANT
GLOVE BIOGEL PI INDICATOR 7.5 (GLOVE) ×2
GOWN STRL REUS W/ TWL LRG LVL3 (GOWN DISPOSABLE) ×6 IMPLANT
GOWN STRL REUS W/TWL LRG LVL3 (GOWN DISPOSABLE) ×6
HEMOSTAT SPONGE AVITENE ULTRA (HEMOSTASIS) ×4 IMPLANT
INSERT FOGARTY SM (MISCELLANEOUS) IMPLANT
KIT BASIN OR (CUSTOM PROCEDURE TRAY) ×4 IMPLANT
KIT ROOM TURNOVER OR (KITS) ×4 IMPLANT
MARKER GRAFT CORONARY BYPASS (MISCELLANEOUS) IMPLANT
NS IRRIG 1000ML POUR BTL (IV SOLUTION) ×8 IMPLANT
PACK PERIPHERAL VASCULAR (CUSTOM PROCEDURE TRAY) ×4 IMPLANT
PAD ARMBOARD 7.5X6 YLW CONV (MISCELLANEOUS) ×8 IMPLANT
PATCH VASC XENOSURE 1CMX6CM (Vascular Products) IMPLANT
PATCH VASC XENOSURE 1X6 (Vascular Products) IMPLANT
SET MICROPUNCTURE 5F STIFF (MISCELLANEOUS) IMPLANT
STAPLER VISISTAT 35W (STAPLE) IMPLANT
STOPCOCK 4 WAY LG BORE MALE ST (IV SETS) IMPLANT
SUT ETHILON 3 0 PS 1 (SUTURE) IMPLANT
SUT GORETEX 5 0 TT13 24 (SUTURE) IMPLANT
SUT GORETEX 6.0 TT13 (SUTURE) IMPLANT
SUT MNCRL AB 4-0 PS2 18 (SUTURE) ×12 IMPLANT
SUT PROLENE 5 0 C 1 24 (SUTURE) ×4 IMPLANT
SUT PROLENE 6 0 BV (SUTURE) ×4 IMPLANT
SUT PROLENE 7 0 BV 1 (SUTURE) IMPLANT
SUT SILK 2 0 FS (SUTURE) IMPLANT
SUT SILK 3 0 (SUTURE)
SUT SILK 3-0 18XBRD TIE 12 (SUTURE) IMPLANT
SUT VIC AB 2-0 CT1 27 (SUTURE) ×4
SUT VIC AB 2-0 CT1 TAPERPNT 27 (SUTURE) ×4 IMPLANT
SUT VIC AB 3-0 SH 27 (SUTURE) ×12
SUT VIC AB 3-0 SH 27X BRD (SUTURE) ×12 IMPLANT
TRAY FOLEY W/METER SILVER 16FR (SET/KITS/TRAYS/PACK) ×4 IMPLANT
TUBING EXTENTION W/L.L. (IV SETS) IMPLANT
UNDERPAD 30X30 (UNDERPADS AND DIAPERS) ×4 IMPLANT
WATER STERILE IRR 1000ML POUR (IV SOLUTION) ×4 IMPLANT

## 2016-07-25 NOTE — Anesthesia Preprocedure Evaluation (Addendum)
Anesthesia Evaluation  Patient identified by MRN, date of birth, ID band Patient awake    Reviewed: Allergy & Precautions, NPO status , Patient's Chart, lab work & pertinent test results, reviewed documented beta blocker date and time   Airway Mallampati: II  TM Distance: >3 FB Neck ROM: Full    Dental  (+) Dental Advisory Given, Upper Dentures, Missing   Pulmonary sleep apnea , former smoker,  PHTN   Pulmonary exam normal breath sounds clear to auscultation       Cardiovascular hypertension, Pt. on medications and Pt. on home beta blockers + CAD, + Cardiac Stents (RCA), + Peripheral Vascular Disease and +CHF  Normal cardiovascular exam Rhythm:Regular Rate:Normal  Echo 7/17: Study Conclusions  - Left ventricle: The cavity size was normal. There was moderate concentric hypertrophy. Systolic function was mildly reduced. The estimated ejection fraction was in the range of 45% to 50%. Wall motion was normal; there were no regional wall motion abnormalities. - Aortic valve: Trileaflet; mildly thickened, mildly calcified   leaflets. - Aorta: The aorta was moderately calcified. - Mitral valve: Severely calcified annulus. Severely thickened, severely calcified leaflets . There was mild regurgitation. - Left atrium: The atrium was moderately dilated. - Right ventricle: The cavity size was mildly dilated. Wall   thickness was normal. - Tricuspid valve: There was severe regurgitation. - Pulmonic valve: There was moderate regurgitation. - Pulmonary arteries: Systolic pressure was severely increased. PA peak pressure: 86 mm Hg (S). - Pericardium, extracardiac: A small pericardial effusion was identified circumferential to the heart. There was no evidence of hemodynamic compromise.  Impressions:  - EF has mildly improved when compared to prior.   Neuro/Psych Seizures -, Well Controlled,  PSYCHIATRIC DISORDERS Depression CVA, No Residual  Symptoms    GI/Hepatic Neg liver ROS, GERD  Medicated and Controlled,  Endo/Other  Hypothyroidism Obesity   Renal/GU ESRF and DialysisRenal disease (MWF)Renal ca K+ 5.0     Musculoskeletal negative musculoskeletal ROS (+)   Abdominal   Peds  Hematology  (+) Blood dyscrasia, anemia ,   Anesthesia Other Findings Day of surgery medications reviewed with the patient.  Reproductive/Obstetrics                            Anesthesia Physical Anesthesia Plan  ASA: III  Anesthesia Plan: General   Post-op Pain Management:    Induction: Intravenous  PONV Risk Score and Plan: 3 and Ondansetron, Dexamethasone and Midazolam  Airway Management Planned: Oral ETT  Additional Equipment:   Intra-op Plan:   Post-operative Plan: Extubation in OR  Informed Consent: I have reviewed the patients History and Physical, chart, labs and discussed the procedure including the risks, benefits and alternatives for the proposed anesthesia with the patient or authorized representative who has indicated his/her understanding and acceptance.   Dental advisory given  Plan Discussed with: CRNA  Anesthesia Plan Comments:        Anesthesia Quick Evaluation

## 2016-07-25 NOTE — Op Note (Addendum)
OPERATIVE NOTE   PROCEDURE: 1. Left above-the-knee popliteal artery exposure 2. Left common femoral artery exposure 3. Aborted left femoropopliteal bypass  PRE-OPERATIVE DIAGNOSIS: lifestyle limiting intermittent claudication (L>R)  POST-OPERATIVE DIAGNOSIS: same as above   SURGEON: Adele Barthel, MD  ASSISTANT(S): Silva Bandy, PAC   ANESTHESIA: general  ESTIMATED BLOOD LOSS: 50 cc  FINDING(S): 1.  Non-clampable distal above-the-knee popliteal artery 2.  Heavily calcified distal superficial femoral artery  3.  Non-clampable common femoral artery both proximally and distally extending into heavily calcified profunda femoral artery  4.  Diffuse dense scarring in left groin  SPECIMEN(S):  none  INDICATIONS:   [name] is a 71 y.o. female who presents with lifestyle limiting intermittent claudication (L>R).  The patient presents today for a left femoropopliteal bypass.  The risk, benefits, and alternative for bypass operations were discussed with the patient.  The patient is aware the risks include but are not limited to: bleeding, infection, myocardial infarction, stroke, limb loss, nerve damage, limb edema, need for additional procedures in the future, wound complications, and inability to complete the bypass.  I reiterated the possibility that her arteries might be too calcified to clamp, which would make the case not possible.  The patient is aware of these risks and agreed to proceed. .  DESCRIPTION: After obtaining full informed written consent, the patient was brought back to the operating room and placed supine upon the operating table.  The patient received IV antibiotics prior to induction.  A procedure time out was completed and the correct surgical site was verified.  After obtaining adequate anesthesia, the patient was prepped and draped in the standard fashion for: left femoropopliteal bypass.  I placed a bump into the popliteal fossa and externally rotated the left  knee.  I made an incision over Hunter's canal and dissected through the fascial plane until I could feel the neurovascular bundle.  There was an extensive amount of fat that needed to be debrided before I could visualize the above-the-knee popliteal artery.  This artery was calcified throughout.  The distal segment of the above-the-knee popliteal artery was not clampable, despite trying multiple clamps.  The distal superficial femoral artery, however, appeared to be clampable despite being heavily calcified.  The mid-segment was also calcified but I felt I might be able to perform a limited endarterectomy.  I planned on placing a Fogarty catheter into the distal segment to control bleeding.  I packed this exposure with a wet ray-tec and then turned my attention to the right groin.  Under Sonosite guidance, I made an incision over the left common femoral artery.  I dissected down through densely scarred subcutaneous tissue.  Eventually, I was able to dissect out the common femoral artery.  The entire common femoral artery was heavily calcified.  Distally the femoral arteries were not clampable despite trying multiple clamps.  The proximal common femoral artery and distal was even more calcified than the rest of the artery.  At this point, I felt this was likely to be an impossible bypass.  I elected to abort the procedure.     All incisions were washed out and packed with Avitene.  After waiting a few minutes, I removed all Avitene and washed out each incision.  The above-the-knee popliteal exposure was repaired with a double layer of 3-0 Vicryl in the deep space and then a single layer of 3-0 Vicryl in the superficial subcutaneous tissue.  The skin was reapproximated with a running subcuticular stitch  of 4-0 Monocryl.  The skin was cleaned, dried, and reinforced with Dermabond.  The left groin was was dry without bleeding.  I reapproximated the tissue above the common femoral artery with a double layer of 2-0  Vicryl.  The subcutaneous tissue was reapproximated with a double layer of 3-0 Vicryl.   The skin was reapproximated with a running subcuticular stitch of 4-0 Monocryl.  The skin was cleaned, dried, and reinforced with Dermabond.   COMPLICATIONS: none  CONDITION: stable   Adele Barthel, MD, [facility] [place] Office: [phone] Pager: [phone]  07/25/2016, 9:22 AM

## 2016-07-25 NOTE — Transfer of Care (Signed)
Immediate Anesthesia Transfer of Care Note  Patient: [name]  Procedure(s) Performed: Procedure(s): ARTERY EXPLORATION LEFT ABOVE KNEE POPLITEAL AND GROIN (Left)  Patient Location: PACU  Anesthesia Type:General  Level of Consciousness: awake and oriented  Airway & Oxygen Therapy: Patient Spontanous Breathing and Patient connected to nasal cannula oxygen  Post-op Assessment: Report given to RN  Post vital signs: Reviewed and stable  Last Vitals:  Vitals:   07/25/16 0540  BP: (!) 98/56  Pulse: 78  Resp: 16  Temp: 37.2 C    Last Pain:  Vitals:   07/25/16 0540  TempSrc: Oral         Complications: No apparent anesthesia complications

## 2016-07-25 NOTE — Progress Notes (Signed)
Patient has arrived on unit from PACU. Patient oriented to unit, assessed, placed on tele, VS were stable.    

## 2016-07-25 NOTE — Anesthesia Procedure Notes (Signed)
Procedure Name: Intubation Date/Time: 07/25/2016 7:41 AM Performed by: Barrington Ellison Pre-anesthesia Checklist: Patient identified, Emergency Drugs available, Suction available and Patient being monitored Patient Re-evaluated:Patient Re-evaluated prior to induction Oxygen Delivery Method: Circle System Utilized Preoxygenation: Pre-oxygenation with 100% oxygen Induction Type: IV induction Ventilation: Mask ventilation without difficulty Laryngoscope Size: Mac and 3 Grade View: Grade I Tube type: Oral Tube size: 7.0 mm Number of attempts: 1 Airway Equipment and Method: Stylet Placement Confirmation: ETT inserted through vocal cords under direct vision,  positive ETCO2 and breath sounds checked- equal and bilateral Secured at: 21 cm Tube secured with: Tape Dental Injury: Teeth and Oropharynx as per pre-operative assessment

## 2016-07-25 NOTE — H&P (Signed)
Brief History and Physical   History of Present Illness   [name] is a 71 y.o. female who presents with chief complaint: short distance intermittent claudication now lifestyle limiting.  The patient presents today for left femoropopliteal bypass.  Pt has undergone Cardiac and Pulmonary preop clearance.    Past Medical History:  Diagnosis Date  . Anemia    History  of Blood transfusion  . CAD (coronary artery disease)    stent to RCA  . Cancer ([place])    clear cell cancer, kidney  . Chronic renal insufficiency    On hemodialysis  . Complication of anesthesia 12/2010   pt is very confused, with AMS with anesthesia  . CVA 07/27/2008   CVA affected cognition and memory per family, no focal deficits.    . CVA (cerebral infarction) 2003   no apparent residual  . Diastolic congestive heart failure ([place])   . Dyslipidemia   . Encephalopathy   . ESRD 07/27/2008   ESRD due to HTN and NSAID's, started hemodialysis in 2005 in [place], [place]. Went to [facility] from 2010 to 2012 and since 2012 has been getting dialysis at [facility] on [address] in [place] on a MWF schedule. First access with RUA AVG placed in [place]. Next and current access was LUA AVG placed by Dr. Lucky Cowboy in [place] in or around 2012. Has had 2 or 3 procedures on that graft since placed per family. She gets her access work done here in [place] now. She is allergic to heparin and does not get any heparin at dialysis; she had an allergic reaction apparently when in ICU in the past.      . GERD (gastroesophageal reflux disease)   . Hyperlipidemia   . Hypertension   . Hypothyroidism   . Positive PPD    completed rifampin  . Pulmonary hypertension ([place])   . PVD (peripheral vascular disease) ([place])   . Seizures ([place])    last seizure 6 years ago  . Sleep apnea    Sleep Study 2008    Past Surgical History:  Procedure Laterality Date  . ABDOMINAL AORTOGRAM W/LOWER EXTREMITY Bilateral 06/15/2016   Procedure: Abdominal Aortogram w/Lower Extremity;  Surgeon: Conrad [place]ville, MD;  Location: [facility];  Service: Cardiovascular;  Laterality: Bilateral;  . ABDOMINAL HYSTERECTOMY    . APPENDECTOMY    . CARDIAC CATHETERIZATION     last 2016  . CHOLECYSTECTOMY    . D&Cs    . LEFT HEART CATHETERIZATION WITH CORONARY ANGIOGRAM N/A 02/22/2011   Procedure: LEFT HEART CATHETERIZATION WITH CORONARY ANGIOGRAM;  Surgeon: Wellington Hampshire, MD;  Location: [facility] CATH LAB;  Service: Cardiovascular;  Laterality: N/A;  . left nephrectomy    . NEPHRECTOMY Left    Malignant tumor  . PERIPHERAL VASCULAR CATHETERIZATION N/A 12/31/2014   Procedure: Abdominal Aortogram;  Surgeon: Conrad Cedarville, MD;  Location: [facility];  Service: Cardiovascular;  Laterality: N/A;  . right ankle repair    . RIGHT HEART CATHETERIZATION N/A 01/29/2014   Procedure: RIGHT HEART CATH;  Surgeon: Larey Dresser, MD;  Location: [facility] CATH LAB;  Service: Cardiovascular;  Laterality: N/A;  . TONSILLECTOMY    . VASCULAR SURGERY  11/2010   graft inserted to left arm    Social History   Social History  . Marital status: Divorced    Spouse name: N/A  . Number of children: 2  . Years of education: 16   Occupational History  .  Retired Marine scientist   .  Retired   Social History Main Topics  . Smoking status: Former Smoker    Packs/day: 0.50    Years: 53.00    Types: Cigarettes    Quit date: 12/17/2015  . Smokeless tobacco: Never Used  . Alcohol use 0.0 oz/week     Comment: very rarely per pt  . Drug use: No     Comment: former marijuana use, several years  . Sexual activity: Not Currently    Birth control/ protection: Post-menopausal   Other Topics Concern  . Not on file   Social History Narrative  . No narrative on file    Family History  Problem Relation Age of Onset  . Heart disease Father   . Hypertension Mother   . Dementia Mother   . Coronary artery disease Sister   . Heart attack Sister   . Hypertension  Brother     Current Facility-Administered Medications  Medication Dose Route Frequency Provider Last Rate Last Dose  . 0.9 %  sodium chloride infusion   Intravenous Continuous Conrad [place], MD      . vancomycin (VANCOCIN) IVPB 1000 mg/200 mL premix  1,000 mg Intravenous 60 min Pre-Op Conrad [place], MD        Allergies  Allergen Reactions  . Ace Inhibitors Anaphylaxis and Rash  . Heparin Other (See Comments)    MDs told her not to take after reaction in ICU  . Iohexol Swelling and Other (See Comments)    1970s; passed out and had facial/tongue swelling.  Requires 13-hour prep with prednisone and benadryl  . Phenytoin Other (See Comments)    Had reaction while in ICU; doesn't know.  MDs told her not to take ever again.  . Wellbutrin [Bupropion] Other (See Comments)    seizures  . Meperidine Hcl Swelling and Rash    Makes tongue swell  . Morphine Rash  . Penicillins Hives and Rash    Has patient had a PCN reaction causing immediate rash, facial/tongue/throat swelling, SOB or lightheadedness with hypotension: Yes Has patient had a PCN reaction causing severe rash involving mucus membranes or skin necrosis: No Has patient had a PCN reaction that required hospitalization: No Has patient had a PCN reaction occurring within the last 10 years: No If all of the above answers are "NO", then may proceed with Cephalosporin use.    . Valproic Acid And Related Other (See Comments)    Confusion   . Iodinated Diagnostic Agents Other (See Comments)    Pre-meditate with benadryl and prednisone 3 times before appt.  . Pentazocine Lactate Other (See Comments)    Patient does not remember reaction to this med ([place]).     Review of Systems: As listed above, otherwise negative.   Physical Examination   Vitals:   07/25/16 0540 07/25/16 0553  BP: (!) 98/56   Pulse: 78   Resp: 16   Temp: 98.9 F (37.2 C)   TempSrc: Oral   SpO2: 97%   Weight: 176 lb (79.8 kg)   Height:  5' 2.5" (1.588  m)   Body mass index is 31.68 kg/m.  General Alert, O x 3, WD, NAD  Pulmonary Sym exp, good B air movt, CTA B  Cardiac RRR, Nl S1, S2, no Murmurs, No rubs, No S3,S4  Musculo- skeletal B feet without ischemic changes, DNVI  Neurologic Pain and light touch intact in extremities ,     Laboratory  See [facility]   Medical Decision Making  [name] is a 71 y.o. female who presents with: lifestyling limiting intermittent claudication s/p failed B SFA stents   The patient is scheduled for: left femoropopliteal bypass. The risk, benefits, and alternative for bypass operations were discussed with the patient.   The patient is aware the risks include but are not limited to: bleeding, infection, myocardial infarction, stroke, limb loss, nerve damage, limb edema, need for additional procedures in the future, wound complications, and inability to complete the bypass.  The patient is aware of these risks and agreed to proceed.   Adele Barthel, MD, FACS [facility] [place] Office: [phone] Pager: [phone]  07/25/2016, 7:12 AM

## 2016-07-25 NOTE — Anesthesia Postprocedure Evaluation (Signed)
Anesthesia Post Note  Patient: [name]  Procedure(s) Performed: Procedure(s) (LRB): ARTERY EXPLORATION LEFT ABOVE KNEE POPLITEAL AND GROIN (Left)     Patient location during evaluation: PACU Anesthesia Type: General Level of consciousness: awake and alert Pain management: pain level controlled Vital Signs Assessment: post-procedure vital signs reviewed and stable Respiratory status: spontaneous breathing, nonlabored ventilation, respiratory function stable and patient connected to nasal cannula oxygen Cardiovascular status: blood pressure returned to baseline and stable Postop Assessment: no signs of nausea or vomiting Anesthetic complications: no    Last Vitals:  Vitals:   07/25/16 1115 07/25/16 1125  BP: (!) 156/67 (!) 157/74  Pulse: 62 (!) 59  Resp: 12 11  Temp:      Last Pain:  Vitals:   07/25/16 1110  TempSrc:   PainSc: Asleep                 Catalina Gravel

## 2016-07-25 NOTE — Progress Notes (Signed)
PHARMACY NOTE:  ANTIMICROBIAL RENAL DOSAGE ADJUSTMENT  Current antimicrobial regimen includes a mismatch between antimicrobial dosage and estimated renal function.  As per policy approved by the Pharmacy & Therapeutics and Medical Executive Committees, the antimicrobial dosage will be adjusted accordingly.  Current antimicrobial dosage:  Vancomycin 1gm IV q12h x 2 doses  Indication: surgical prophylaxis  Renal Function:  Estimated Creatinine Clearance: 8.5 mL/min (A) (by C-G formula based on SCr of 5.93 mg/dL (H)). [x]      On intermittent HD, scheduled:  MWF, next expected on 07/26/16 []      On CRRT    Antimicrobial dosage has been changed to:  Order cancelled. No Vancomycin needed post-op.   Additional comments:   Pre-op dose should provide coverage for at least 24hrs post-op.   Thank you for allowing pharmacy to be a part of this patient's care.  Arty Baumgartner, [facility]  Pager: [phone] 07/25/2016 5:05 PM

## 2016-07-26 ENCOUNTER — Encounter (HOSPITAL_COMMUNITY): Payer: Self-pay | Admitting: Vascular Surgery

## 2016-07-26 DIAGNOSIS — I70213 Atherosclerosis of native arteries of extremities with intermittent claudication, bilateral legs: Secondary | ICD-10-CM | POA: Diagnosis not present

## 2016-07-26 DIAGNOSIS — D631 Anemia in chronic kidney disease: Secondary | ICD-10-CM | POA: Diagnosis not present

## 2016-07-26 DIAGNOSIS — N2581 Secondary hyperparathyroidism of renal origin: Secondary | ICD-10-CM | POA: Diagnosis not present

## 2016-07-26 DIAGNOSIS — I70211 Atherosclerosis of native arteries of extremities with intermittent claudication, right leg: Secondary | ICD-10-CM | POA: Diagnosis not present

## 2016-07-26 DIAGNOSIS — D649 Anemia, unspecified: Secondary | ICD-10-CM | POA: Diagnosis not present

## 2016-07-26 DIAGNOSIS — E1122 Type 2 diabetes mellitus with diabetic chronic kidney disease: Secondary | ICD-10-CM | POA: Diagnosis not present

## 2016-07-26 DIAGNOSIS — I132 Hypertensive heart and chronic kidney disease with heart failure and with stage 5 chronic kidney disease, or end stage renal disease: Secondary | ICD-10-CM | POA: Diagnosis not present

## 2016-07-26 DIAGNOSIS — N186 End stage renal disease: Secondary | ICD-10-CM | POA: Diagnosis not present

## 2016-07-26 DIAGNOSIS — I120 Hypertensive chronic kidney disease with stage 5 chronic kidney disease or end stage renal disease: Secondary | ICD-10-CM | POA: Diagnosis not present

## 2016-07-26 DIAGNOSIS — I5032 Chronic diastolic (congestive) heart failure: Secondary | ICD-10-CM | POA: Diagnosis not present

## 2016-07-26 DIAGNOSIS — Z992 Dependence on renal dialysis: Secondary | ICD-10-CM | POA: Diagnosis not present

## 2016-07-26 LAB — BASIC METABOLIC PANEL
ANION GAP: 12 (ref 5–15)
BUN: 44 mg/dL — ABNORMAL HIGH (ref 6–20)
CHLORIDE: 92 mmol/L — AB (ref 101–111)
CO2: 28 mmol/L (ref 22–32)
Calcium: 8.2 mg/dL — ABNORMAL LOW (ref 8.9–10.3)
Creatinine, Ser: 10.04 mg/dL — ABNORMAL HIGH (ref 0.44–1.00)
GFR calc Af Amer: 4 mL/min — ABNORMAL LOW (ref >60)
GFR, EST NON AFRICAN AMERICAN: 3 mL/min — AB (ref >60)
GLUCOSE: 114 mg/dL — AB (ref 65–99)
POTASSIUM: 6.3 mmol/L — AB (ref 3.5–5.1)
SODIUM: 132 mmol/L — AB (ref 135–145)

## 2016-07-26 LAB — CBC
HCT: 28.9 % — ABNORMAL LOW (ref 36.0–46.0)
HEMOGLOBIN: 9.3 g/dL — AB (ref 12.0–15.0)
MCH: 26.5 pg (ref 26.0–34.0)
MCHC: 32.2 g/dL (ref 30.0–36.0)
MCV: 82.3 fL (ref 78.0–100.0)
PLATELETS: 155 10*3/uL (ref 150–400)
RBC: 3.51 MIL/uL — AB (ref 3.87–5.11)
RDW: 14.4 % (ref 11.5–15.5)
WBC: 6 10*3/uL (ref 4.0–10.5)

## 2016-07-26 LAB — GLUCOSE, CAPILLARY: Glucose-Capillary: 112 mg/dL — ABNORMAL HIGH (ref 65–99)

## 2016-07-26 LAB — POTASSIUM: POTASSIUM: 5.3 mmol/L — AB (ref 3.5–5.1)

## 2016-07-26 MED ORDER — INSULIN ASPART 100 UNIT/ML ~~LOC~~ SOLN
10.0000 [IU] | Freq: Once | SUBCUTANEOUS | Status: AC
  Administered 2016-07-26: 10 [IU] via SUBCUTANEOUS

## 2016-07-26 MED ORDER — LIDOCAINE HCL (PF) 1 % IJ SOLN: 5.0000 mL | INTRAMUSCULAR | Status: DC | PRN

## 2016-07-26 MED ORDER — PENTAFLUOROPROP-TETRAFLUOROETH EX AERO: 1.0000 "application " | INHALATION_SPRAY | CUTANEOUS | Status: DC | PRN

## 2016-07-26 MED ORDER — DEXTROSE 50 % IV SOLN
1.0000 | Freq: Once | INTRAVENOUS | Status: AC
  Administered 2016-07-26: 50 mL via INTRAVENOUS
  Filled 2016-07-26: qty 50

## 2016-07-26 MED ORDER — AMBRISENTAN 5 MG PO TABS: 5.0000 mg | ORAL_TABLET | Freq: Every day | ORAL | Status: DC

## 2016-07-26 MED ORDER — DIPHENHYDRAMINE HCL 25 MG PO CAPS
ORAL_CAPSULE | ORAL | Status: AC
  Administered 2016-07-26: 50 mg via ORAL
  Filled 2016-07-26: qty 2

## 2016-07-26 MED ORDER — ALTEPLASE 2 MG IJ SOLR: 2.0000 mg | Freq: Once | INTRAMUSCULAR | Status: DC | PRN

## 2016-07-26 MED ORDER — TRAMADOL HCL 50 MG PO TABS
ORAL_TABLET | ORAL | Status: AC
  Filled 2016-07-26: qty 1

## 2016-07-26 MED ORDER — SODIUM CHLORIDE 0.9 % IV SOLN
100.0000 mL | INTRAVENOUS | Status: DC | PRN
Start: 2016-07-26 — End: 2016-07-26

## 2016-07-26 MED ORDER — OXYCODONE-ACETAMINOPHEN 5-325 MG PO TABS: 1.0000 | ORAL_TABLET | ORAL | 0 refills | Status: DC | PRN

## 2016-07-26 MED ORDER — DEXTROSE 50 % IV SOLN
INTRAVENOUS | Status: AC
  Administered 2016-07-26: 25 mL via INTRAVENOUS
  Filled 2016-07-26: qty 50

## 2016-07-26 MED ORDER — TRAMADOL HCL 50 MG PO TABS: 50.0000 mg | ORAL_TABLET | Freq: Two times a day (BID) | ORAL | 0 refills | Status: DC | PRN

## 2016-07-26 MED ORDER — ACETAMINOPHEN 500 MG PO TABS
ORAL_TABLET | ORAL | Status: AC
  Administered 2016-07-26: 1000 mg via ORAL
  Filled 2016-07-26: qty 2

## 2016-07-26 MED ORDER — SODIUM CHLORIDE 0.9 % IV SOLN: 100.0000 mL | INTRAVENOUS | Status: DC | PRN

## 2016-07-26 MED ORDER — SODIUM POLYSTYRENE SULFONATE 15 GM/60ML PO SUSP
30.0000 g | Freq: Once | ORAL | Status: DC
  Filled 2016-07-26: qty 120

## 2016-07-26 MED ORDER — LIDOCAINE-PRILOCAINE 2.5-2.5 % EX CREA: 1.0000 "application " | TOPICAL_CREAM | CUTANEOUS | Status: DC | PRN

## 2016-07-26 MED ORDER — DEXTROSE 50 % IV SOLN
25.0000 mL | Freq: Once | INTRAVENOUS | Status: AC
  Administered 2016-07-26: 25 mL via INTRAVENOUS

## 2016-07-26 NOTE — Progress Notes (Addendum)
  Progress Note  SUBJECTIVE:    POD #1  Doesn't feel well. CBG was 22, now 112.   OBJECTIVE:   Vitals:   07/26/16 0631 07/26/16 0649  BP: (!) 178/64 (!) 160/67  Pulse: 66   Resp:    Temp:      Intake/Output Summary (Last 24 hours) at 07/26/16 0739 Last data filed at 07/25/16 2330  Gross per 24 hour  Intake             1020 ml  Output               10 ml  Net             1010 ml   Left groin and above knee incisions c/d/i without hematoma  ASSESSMENT/PLAN:   71 y.o. female is s/p: left femoral artery exposure, left above knee popliteal artery exposure, aborted left fem-pop bypass 1 Day Post-Op   LLE claudication: Patient had inadequate inflow and distal targets for bypass. Dr. Bridgett Larsson to discuss potential endovascular options with Dr. Donzetta Matters.  Hyperkalemia: K 6.3 this am. Received 10 units of novolog resulting in hypoglycemia which is now resolved. Consulted renal for HD today.    Alvia Grove 07/26/2016 7:39 AM -- LABS:   CBC    Component Value Date/Time   WBC 6.0 07/26/2016 0241   HGB 9.3 (L) 07/26/2016 0241   HCT 28.9 (L) 07/26/2016 0241   PLT 155 07/26/2016 0241    BMET    Component Value Date/Time   NA 132 (L) 07/26/2016 0241   K 6.3 (HH) 07/26/2016 0241   K 4.9 01/16/2011 1503   CL 92 (L) 07/26/2016 0241   CO2 28 07/26/2016 0241   GLUCOSE 114 (H) 07/26/2016 0241   BUN 44 (H) 07/26/2016 0241   CREATININE 10.04 (H) 07/26/2016 0241   CREATININE 4.16 (H) 06/04/2015 1654   CALCIUM 8.2 (L) 07/26/2016 0241   GFRNONAA 3 (L) 07/26/2016 0241   GFRNONAA 10 (L) 06/04/2015 1654   GFRAA 4 (L) 07/26/2016 0241   GFRAA 12 (L) 06/04/2015 1654    COAG Lab Results  Component Value Date   INR 1.04 07/21/2016   INR 0.97 07/06/2016   INR 1.05 01/29/2014   No results found for: PTT  ANTIBIOTICS:   Anti-infectives    Start     Dose/Rate Route Frequency Ordered Stop   07/25/16 1645  vancomycin (VANCOCIN) IVPB 1000 mg/200 mL premix  Status:  Discontinued     1,000 mg 200 mL/hr over 60 Minutes Intravenous Every 12 hours 07/25/16 1630 07/25/16 1705   07/25/16 0715  vancomycin (VANCOCIN) IVPB 1000 mg/200 mL premix     1,000 mg 200 mL/hr over 60 Minutes Intravenous 60 min pre-op 07/24/16 1452 07/25/16 0830       Virgina Jock, PA-C [facility] Office: [phone] Pager: [phone] 07/26/2016 7:39 AM   Addendum  I have independently interviewed and examined the patient, and I agree with the physician assistant's findings.  Inc x 2 are intact.  Pt ok with D/C today after HD.  Will have Dr. Donzetta Matters review for any possible retrograde intervention options.   Adele Barthel, MD, FACS [facility] [place] Office: [phone] Pager: [phone]  07/26/2016, 9:25 AM

## 2016-07-26 NOTE — Consult Note (Signed)
[facility] Renal Consultation Note    Indication for Consultation:  Management of ESRD/hemodialysis; anemia, hypertension/volume and secondary hyperparathyroidism  HPI: [name] is a 71 y.o. female with ESRD on HD, DM, HTN, PAD. Admitted yesterday with worsening claudication and planned left fem-pop bypass per Dr. Bridgett Larsson. During procedure found to have heavily calcified artery with inadequate inflow and distal targets for bypass. Labs notable for K 6.3 We are asked to see for scheduled dialysis today.  Seen on HD currently, endorses bilat leg pain, pain mostly with walking.   Dialyzes at [facility] MWF. Last HD was 7/23. Has been compliant with HD with no recent issues noted.   Past Medical History:  Diagnosis Date  . Anemia    History  of Blood transfusion  . CAD (coronary artery disease)    stent to RCA  . Cancer ([place])    clear cell cancer, kidney  . Chronic renal insufficiency    On hemodialysis  . Complication of anesthesia 12/2010   pt is very confused, with AMS with anesthesia  . CVA 07/27/2008   CVA affected cognition and memory per family, no focal deficits.    . CVA (cerebral infarction) 2003   no apparent residual  . Diastolic congestive heart failure ([place])   . Dyslipidemia   . Encephalopathy   . ESRD 07/27/2008   ESRD due to HTN and NSAID's, started hemodialysis in 2005 in [place], [place]. Went to [facility] from 2010 to 2012 and since 2012 has been getting dialysis at [facility] on [address] in [place] on a MWF schedule. First access with RUA AVG placed in [place]. Next and current access was LUA AVG placed by Dr. Lucky Cowboy in [place] in or around 2012. Has had 2 or 3 procedures on that graft since placed per family. She gets her access work done here in [place] now. She is allergic to heparin and does not get any heparin at dialysis; she had an allergic reaction apparently when in ICU in the past.      . GERD  (gastroesophageal reflux disease)   . Hyperlipidemia   . Hypertension   . Hypothyroidism   . Positive PPD    completed rifampin  . Pulmonary hypertension ([place])   . PVD (peripheral vascular disease) ([place])   . Seizures ([place])    last seizure 6 years ago  . Sleep apnea    Sleep Study 2008   Past Surgical History:  Procedure Laterality Date  . ABDOMINAL AORTOGRAM W/LOWER EXTREMITY Bilateral 06/15/2016   Procedure: Abdominal Aortogram w/Lower Extremity;  Surgeon: Conrad [place], MD;  Location: [place] [facility];  Service: Cardiovascular;  Laterality: Bilateral;  . ABDOMINAL HYSTERECTOMY    . APPENDECTOMY    . CARDIAC CATHETERIZATION     last 2016  . CHOLECYSTECTOMY    . D&Cs    . LEFT HEART CATHETERIZATION WITH CORONARY ANGIOGRAM N/A 02/22/2011   Procedure: LEFT HEART CATHETERIZATION WITH CORONARY ANGIOGRAM;  Surgeon: Wellington Hampshire, MD;  Location: [facility] CATH LAB;  Service: Cardiovascular;  Laterality: N/A;  . left nephrectomy    . NEPHRECTOMY Left    Malignant tumor  . PERIPHERAL VASCULAR CATHETERIZATION N/A 12/31/2014   Procedure: Abdominal Aortogram;  Surgeon: Conrad Quinnesec, MD;  Location: [place] [facility];  Service: Cardiovascular;  Laterality: N/A;  . right ankle repair    . RIGHT HEART CATHETERIZATION N/A 01/29/2014   Procedure: RIGHT HEART CATH;  Surgeon: Larey Dresser, MD;  Location:  [facility] CATH LAB;  Service: Cardiovascular;  Laterality: N/A;  . TONSILLECTOMY    . VASCULAR SURGERY  11/2010   graft inserted to left arm   Family History  Problem Relation Age of Onset  . Heart disease Father   . Hypertension Mother   . Dementia Mother   . Coronary artery disease Sister   . Heart attack Sister   . Hypertension Brother    Social History:  reports that she quit smoking about 7 months ago. Her smoking use included Cigarettes. She has a 26.50 pack-year smoking history. She has never used smokeless tobacco. She reports that she drinks alcohol. She reports that she does not use  drugs. Allergies  Allergen Reactions  . Ace Inhibitors Anaphylaxis and Rash  . Heparin Other (See Comments)    MDs told her not to take after reaction in ICU  . Iohexol Swelling and Other (See Comments)    1970s; passed out and had facial/tongue swelling.  Requires 13-hour prep with prednisone and benadryl  . Phenytoin Other (See Comments)    Had reaction while in ICU; doesn't know.  MDs told her not to take ever again.  . Wellbutrin [Bupropion] Other (See Comments)    seizures  . Meperidine Hcl Swelling and Rash    Makes tongue swell  . Morphine Rash  . Penicillins Hives and Rash    Has patient had a PCN reaction causing immediate rash, facial/tongue/throat swelling, SOB or lightheadedness with hypotension: Yes Has patient had a PCN reaction causing severe rash involving mucus membranes or skin necrosis: No Has patient had a PCN reaction that required hospitalization: No Has patient had a PCN reaction occurring within the last 10 years: No If all of the above answers are "NO", then may proceed with Cephalosporin use.    . Valproic Acid And Related Other (See Comments)    Confusion   . Iodinated Diagnostic Agents Other (See Comments)    Pre-meditate with benadryl and prednisone 3 times before appt.  . Pentazocine Lactate Other (See Comments)    Patient does not remember reaction to this med ([place]).    Prior to Admission medications   Medication Sig Start Date End Date Taking? Authorizing Provider  acetaminophen (TYLENOL) 500 MG tablet Take 1,000 mg by mouth daily as needed for mild pain.    Yes [provider]  albuterol (PROVENTIL HFA;VENTOLIN HFA) 108 (90 Base) MCG/ACT inhaler Inhale 1-2 puffs into the lungs every 6 (six) hours as needed for wheezing or shortness of breath. 09/02/15  Yes McQuaid, Ronie Spies, MD  ambrisentan (LETAIRIS) 5 MG tablet TAKE 1 TABLET (5 MG) ORALLY DAILY. Patient taking differently: Take 5 mg by mouth daily. TAKE 1 TABLET (5 MG) ORALLY DAILY.  11/24/15  Yes Juanito Doom, MD  amLODipine (NORVASC) 10 MG tablet Take 10 mg by mouth at bedtime.    Yes [provider]  aspirin EC 81 MG tablet Take 81 mg by mouth daily.   Yes [provider]  carvedilol (COREG) 25 MG tablet Take 25 mg by mouth 2 (two) times daily with a meal.   Yes [provider]  cilostazol (PLETAL) 100 MG tablet Take 1 tablet (100 mg total) by mouth 2 (two) times daily before a meal. 05/24/16  Yes Conrad Spearman, MD  diphenhydrAMINE (BENADRYL) 50 MG capsule Take 50 mg by mouth on 12/24/14, at 6:30 am. Patient taking differently: Take 50 mg by mouth every Monday, Wednesday, and Friday. On dialysis days 12/24/14  Yes  Angelia Mould, MD  folic acid-vitamin b complex-vitamin c-selenium-zinc (DIALYVITE) 3 MG TABS tablet Take 1 tablet by mouth daily.   Yes [provider]  hydrALAZINE (APRESOLINE) 25 MG tablet Take 25 mg by mouth 3 (three) times daily.   Yes [provider]  isosorbide dinitrate (ISORDIL) 20 MG tablet Take 1 tablet (20 mg total) by mouth daily. 07/11/16  Yes Minus Breeding, MD  levETIRAcetam (KEPPRA) 500 MG tablet Take 500 mg by mouth daily at 8 pm.   Yes [provider]  levothyroxine (SYNTHROID, LEVOTHROID) 150 MCG tablet Take 150 mcg by mouth daily before breakfast.   Yes [provider]  nitroGLYCERIN (NITROSTAT) 0.4 MG SL tablet PLACE 1 TABLET (0.4 MG TOTAL) UNDER THE TONGUE EVERY 5 (FIVE) MINUTES AS NEEDED FOR CHEST PAIN. Patient taking differently: Place 0.4 mg under the tongue every 5 (five) minutes as needed for chest pain. For chest pain. 06/12/16  Yes Juanito Doom, MD  omeprazole (PRILOSEC OTC) 20 MG tablet Take 20 mg by mouth daily.   Yes [provider]  pravastatin (PRAVACHOL) 40 MG tablet Take 1 tablet (40 mg total) by mouth at bedtime. 05/23/16  Yes Minus Breeding, MD  SENSIPAR 90 MG tablet Take 90 mg by mouth every Monday, Wednesday, and Friday with  hemodialysis.  03/05/14  Yes [provider]  sevelamer carbonate (RENVELA) 800 MG tablet Take 1,600-3,200 mg by mouth See admin instructions. 4 tabs TID with each meal, 2 tabs with each snack.   Yes [provider]  traMADol (ULTRAM) 50 MG tablet Take 50 mg by mouth every 12 (twelve) hours as needed for moderate pain.  04/05/14  Yes [provider]  traZODone (DESYREL) 50 MG tablet Take 50 mg by mouth at bedtime as needed for sleep.    Yes [provider]  oxyCODONE-acetaminophen (PERCOCET/ROXICET) 5-325 MG tablet Take 1-2 tablets by mouth every 4 (four) hours as needed for moderate pain. 07/26/16   Alvia Grove, PA-C   Current Facility-Administered Medications  Medication Dose Route Frequency Provider Last Rate Last Dose  . 0.9 %  sodium chloride infusion  500 mL Intravenous Once PRN Virgina Jock A, PA-C      . 0.9 %  sodium chloride infusion   Intravenous Continuous Trinh, Kimberly A, PA-C      . 0.9 %  sodium chloride infusion  100 mL Intravenous PRN Ejigiri, Thomos Lemons, PA-C      . 0.9 %  sodium chloride infusion  100 mL Intravenous PRN Ejigiri, Thomos Lemons, PA-C      . acetaminophen (TYLENOL) tablet 1,000 mg  1,000 mg Oral Daily PRN Virgina Jock A, PA-C   1,000 mg at 07/26/16 1054  . albuterol (PROVENTIL) (2.5 MG/3ML) 0.083% nebulizer solution 3 mL  3 mL Inhalation Q6H PRN Virgina Jock A, PA-C      . alteplase (CATHFLO ACTIVASE) injection 2 mg  2 mg Intracatheter Once PRN Lynnda Child, PA-C      . ambrisentan (LETAIRIS) tablet 5 mg  5 mg Oral Daily Conrad [place], MD      . amLODipine (NORVASC) tablet 10 mg  10 mg Oral QHS Virgina Jock A, PA-C   10 mg at 07/25/16 2133  . aspirin EC tablet 81 mg  81 mg Oral Daily Virgina Jock A, PA-C      . carvedilol (COREG) tablet 25 mg  25 mg Oral BID WC Virgina Jock A, PA-C   25 mg at 07/25/16 2134  .  cilostazol (PLETAL) tablet 100 mg  100 mg Oral BID AC Trinh, Kimberly A, PA-C      .  diphenhydrAMINE (BENADRYL) capsule 50 mg  50 mg Oral Q M,W,F Virgina Jock A, PA-C   50 mg at 07/26/16 1034  . docusate sodium (COLACE) capsule 100 mg  100 mg Oral Daily Trinh, Kimberly A, PA-C      . guaiFENesin-dextromethorphan (ROBITUSSIN DM) 100-10 MG/5ML syrup 15 mL  15 mL Oral Q4H PRN Virgina Jock A, PA-C      . hydrALAZINE (APRESOLINE) injection 5 mg  5 mg Intravenous Q20 Min PRN Virgina Jock A, PA-C   5 mg at 07/26/16 0102  . hydrALAZINE (APRESOLINE) tablet 25 mg  25 mg Oral TID Virgina Jock A, PA-C   25 mg at 07/25/16 2133  . HYDROmorphone (DILAUDID) injection 0.5-1 mg  0.5-1 mg Intravenous Q2H PRN Virgina Jock A, PA-C      . isosorbide dinitrate (ISORDIL) tablet 20 mg  20 mg Oral Daily Trinh, Kimberly A, PA-C      . levETIRAcetam (KEPPRA) tablet 500 mg  500 mg Oral Q2000 Virgina Jock A, PA-C   500 mg at 07/25/16 2005  . levothyroxine (SYNTHROID, LEVOTHROID) tablet 150 mcg  150 mcg Oral QAC breakfast Virgina Jock A, PA-C   150 mcg at 07/26/16 0636  . lidocaine (PF) (XYLOCAINE) 1 % injection 5 mL  5 mL Intradermal PRN Lynnda Child, PA-C      . lidocaine-prilocaine (EMLA) cream 1 application  1 application Topical PRN Lynnda Child, PA-C      . magnesium sulfate IVPB 2 g 50 mL  2 g Intravenous Daily PRN Virgina Jock A, PA-C      . metoprolol tartrate (LOPRESSOR) injection 2-5 mg  2-5 mg Intravenous Q2H PRN Virgina Jock A, PA-C      . multivitamin (RENA-VIT) tablet 1 tablet  1 tablet Oral QHS Virgina Jock A, PA-C   1 tablet at 07/25/16 2134  . nitroGLYCERIN (NITROSTAT) SL tablet 0.4 mg  0.4 mg Sublingual Q5 min PRN Virgina Jock A, PA-C      . ondansetron (ZOFRAN) injection 4 mg  4 mg Intravenous Q6H PRN Virgina Jock A, PA-C      . oxyCODONE-acetaminophen (PERCOCET/ROXICET) 5-325 MG per tablet 1-2 tablet  1-2 tablet Oral Q4H PRN Virgina Jock A, PA-C   1 tablet at 07/26/16 0528  . pantoprazole (PROTONIX) EC tablet 40 mg  40 mg Oral Daily  Skeet Simmer, [facility]      . pentafluoroprop-tetrafluoroeth (GEBAUERS) aerosol 1 application  1 application Topical PRN Ejigiri, Thomos Lemons, PA-C      . phenol (CHLORASEPTIC) mouth spray 1 spray  1 spray Mouth/Throat PRN Virgina Jock A, PA-C      . potassium chloride SA (K-DUR,KLOR-CON) CR tablet 20-40 mEq  20-40 mEq Oral Daily PRN Virgina Jock A, PA-C      . pravastatin (PRAVACHOL) tablet 40 mg  40 mg Oral QHS Virgina Jock A, PA-C   40 mg at 07/25/16 2133  . sevelamer carbonate (RENVELA) tablet 1,600 mg  1,600 mg Oral PRN Skeet Simmer, [facility]      . sevelamer carbonate (RENVELA) tablet 3,200 mg  3,200 mg Oral TID WC Trinh, Kimberly A, PA-C   3,200 mg at 07/26/16 0817  . sodium polystyrene (KAYEXALATE) 15 GM/60ML suspension 30 g  30 g Oral Once Serafina Mitchell, MD      . traMADol Veatrice Bourbon) tablet 50 mg  50 mg Oral Q12H  PRN Virgina Jock A, PA-C   50 mg at 07/25/16 2344  . traZODone (DESYREL) tablet 50 mg  50 mg Oral QHS PRN Virgina Jock A, PA-C        ROS: As per HPI otherwise negative.  Physical Exam: Vitals:   07/26/16 1005 07/26/16 1017 07/26/16 1030 07/26/16 1100  BP: (!) 119/37 (!) 163/73 134/73 (!) 146/75  Pulse: 61 61 61 64  Resp: 20     Temp: (!) 97.4 F (36.3 C)     TempSrc: Oral     SpO2: 100%     Weight: 80.6 kg (177 lb 11.1 oz)     Height:         General: WDWN NAD Head: NCAT sclera not icteric MMM Neck: Supple. No JVD No masses Lungs: CTA bilaterally without wheezes, rales, or rhonchi. Breathing is unlabored. Heart: RRR with S1 S2 Abdomen: soft NT + BS Lower extremities: no LE edema or open wounds Neuro: A & O  X 3. Moves all extremities spontaneously. Psych:  Responds to questions appropriately with a normal affect. Dialysis Access: LUE AVG cannulated on HD   Labs: Basic Metabolic Panel:  Recent Labs Lab 07/21/16 1530 07/25/16 0618 07/26/16 0241 07/26/16 0911  NA 137 134* 132*  --   K 5.0 5.0 6.3* 5.3*  CL 94*  --  92*  --   CO2 32  --   28  --   GLUCOSE 109* 77 114*  --   BUN 20  --  44*  --   CREATININE 5.93*  --  10.04*  --   CALCIUM 8.4*  --  8.2*  --    Liver Function Tests:  Recent Labs Lab 07/21/16 1530  AST 16  ALT 11*  ALKPHOS 88  BILITOT 0.4  PROT 6.1*  ALBUMIN 3.3*   No results for input(s): LIPASE, AMYLASE in the last 168 hours. No results for input(s): AMMONIA in the last 168 hours. CBC:  Recent Labs Lab 07/21/16 1530 07/25/16 0618 07/26/16 0241  WBC 4.1  --  6.0  HGB 10.2* 10.2* 9.3*  HCT 31.9* 30.0* 28.9*  MCV 84.4  --  82.3  PLT 155  --  155   Cardiac Enzymes: No results for input(s): CKTOTAL, CKMB, CKMBINDEX, TROPONINI in the last 168 hours. CBG:  Recent Labs Lab 07/26/16 0727 07/26/16 0735  GLUCAP 22* 112*   Iron Studies: No results for input(s): IRON, TIBC, TRANSFERRIN, FERRITIN in the last 72 hours. Studies/Results: No results found.  Dialysis Orders:  [facility] MWF 4h 180F BFR 450/800 2K/2Ca UF Profile 4 EDW 77.5kg No Heparin Mircera 50 mg IV q 4 weeks ( Last  7/11) Calcitriol 2 mcg PO q HD Sensipar 60mg  PO q HD  Assessment/Plan: 1.  PAD/Claduication - scheduled for fem-pop bypass today but unable to perform d/t inadequate vessels. Will discuss further vascular options as outpatient  2.  ESRD -  MWF - for HD on schedule today  3.  Hypertension/volume  - BP elevated/Should improve with UF today  4.  Anemia  - Hgb 9.3 not due for esa yet  5.  Metabolic bone disease -  Cont VDRA/binders  6.  Nutrition - Renal diet/vitamins 7. DM - per primary   Lynnda Child PA-C [place] Pager [phone] 07/26/2016, 11:48 AM   Pt seen, examined and agree w A/P as above.  ESRD pt w/ attempted fem-pop today unsuccessful.  Now on HD and for dc home after HD this afternoon.  Stable from renal standpoint.   Kelly Splinter MD [employer] pager [phone]   07/26/2016, 1:47 PM

## 2016-07-26 NOTE — Evaluation (Addendum)
Physical Therapy Evaluation and Discharge Patient Details Name: [name] MRN: [id number] DOB: [birth year] Today's Date: 07/26/2016   History of Present Illness  Pt is a 71 yo female with c/o intermittent claudication pain with short distance ambulation. scheduled for R LE fem-pop bypass however found to have inadequate inflow and distal targets and surgical intervention was aborted. PMH significant for CAD, CHF, ESRD on dialysis, 2010 CVA with no focal losses, HTN, pulmonary HTN and PVD.    Clinical Impression  Patient evaluated by Physical Therapy with no further acute PT needs identified. All education has been completed and the patient has no further questions. Pt is currently independent in transfers and supervision for ambulation of 100 feet without AD. Pt states that she walks with cane at home and ambulates better with it. See below for any follow-up Physical Therapy or equipment needs. PT is signing off. Thank you for this referral.     Follow Up Recommendations No PT follow up;Supervision - Intermittent    Equipment Recommendations  None recommended by PT    Recommendations for Other Services OT consult     Precautions / Restrictions Precautions Precautions: None Restrictions Weight Bearing Restrictions: No      Mobility  Bed Mobility               General bed mobility comments: seated EoB at entry  Transfers Overall transfer level: Modified independent   Transfers: Sit to/from Stand Sit to Stand: Independent         General transfer comment: good power up and steadying in standing   Ambulation/Gait Ambulation/Gait assistance: Supervision Ambulation Distance (Feet): 100 Feet Assistive device: None Gait Pattern/deviations: Step-through pattern;Decreased stance time - right;Decreased step length - left;Decreased weight shift to right Gait velocity: slowed Gait velocity interpretation: Below normal speed for age/gender General Gait Details: slow,  steady cadence, slight L lateral lean and R LE circumduction to advance RLE, no LoB, no buckling, claudication pain in R LE began about 90 feet of ambulation        Balance Overall balance assessment: No apparent balance deficits (not formally assessed)                                           Pertinent Vitals/Pain Pain Assessment: No/denies pain    Home Living Family/patient expects to be discharged to:: Private residence Living Arrangements: Alone Available Help at Discharge: Family;Friend(s);Available PRN/intermittently;Neighbor Type of Home: Apartment Home Access: Level entry     Home Layout: One level Home Equipment: [place] - 2 wheels;Cane - single point;Shower seat;Grab bars - tub/shower      Prior Function Level of Independence: Independent with assistive device(s)         Comments: cane for mobility, mod I with ADL     Hand Dominance        Extremity/Trunk Assessment   Upper Extremity Assessment Upper Extremity Assessment: Generalized weakness    Lower Extremity Assessment Lower Extremity Assessment: RLE deficits/detail;LLE deficits/detail RLE Deficits / Details: hip and knee strength grossly 3+/5, ROM WFL  LLE Deficits / Details: hip and knee strength grossly 4/5, ROM WFL    Cervical / Trunk Assessment Cervical / Trunk Assessment: Normal  Communication   Communication: No difficulties  Cognition Arousal/Alertness: Awake/alert Behavior During Therapy: WFL for tasks assessed/performed Overall Cognitive Status: Within Functional Limits for tasks assessed  General Comments General comments (skin integrity, edema, etc.): VSS        Assessment/Plan    PT Assessment Patent does not need any further PT services         PT Goals (Current goals can be found in the Care Plan section)  Acute Rehab PT Goals Patient Stated Goal: go home this afternoon     AM-PAC PT "6  Clicks" Daily Activity  Outcome Measure Difficulty turning over in bed (including adjusting bedclothes, sheets and blankets)?: None Difficulty moving from lying on back to sitting on the side of the bed? : None Difficulty sitting down on and standing up from a chair with arms (e.g., wheelchair, bedside commode, etc,.)?: None Help needed moving to and from a bed to chair (including a wheelchair)?: None Help needed walking in hospital room?: None Help needed climbing 3-5 steps with a railing? : A Little 6 Click Score: 23    End of Session Equipment Utilized During Treatment: Gait belt Activity Tolerance: Patient tolerated treatment well Patient left: Other (comment);with call bell/phone within reach (EoB finishing lunch) Nurse Communication: Mobility status PT Visit Diagnosis: Muscle weakness (generalized) (M62.81)    Time: 7062-3762 PT Time Calculation (min) (ACUTE ONLY): 9 min   Charges:   PT Evaluation $PT Eval Low Complexity: 1 Procedure     PT G Codes:        Inmer Nix B. Migdalia Dk PT, DPT Acute Rehabilitation  [phone] Pager [phone]    [place] 07/26/2016, 4:03 PM

## 2016-07-26 NOTE — Progress Notes (Signed)
Pt given home meds from pharmacy prior to discharge.   Fritz Pickerel, RN

## 2016-07-26 NOTE — Progress Notes (Signed)
Discussed with the patient and all questioned fully answered. She will call me if any problems arise.  IV removed. Telemetry removed. Pt given paper Rx for Tramadol with discharge instructions.   Fritz Pickerel, RN

## 2016-07-26 NOTE — Progress Notes (Signed)
OT Cancellation Note  Patient Details Name: [name] MRN: [id number] DOB: 04/03/1945   Cancelled Treatment:    Reason Eval/Treat Not Completed: Patient at procedure or test/ unavailable (currently in HD). Will follow up for OT eval as time allows.  Binnie Kand M.S., OTR/L Pager: [phone]  07/26/2016, 10:45 AM

## 2016-07-26 NOTE — Evaluation (Signed)
Occupational Therapy Evaluation and Discharge Patient Details Name: [name] MRN: [id number] DOB: Apr 06, 1945 Today's Date: 07/26/2016    History of Present Illness Pt is a 71 yo female with c/o intermittent claudication pain with short distance ambulation. scheduled for R LE fem-pop bypass however found to have inadequate inflow and distal targets and surgical intervention was aborted. PMH significant for CAD, CHF, ESRD on dialysis, 2010 CVA with no focal losses, HTN, pulmonary HTN and PVD.     Clinical Impression   Pt reports she was modified independent with ADL PTA. Currently pt overall supervision for ADL and functional mobility without use of AD. Educated pt on home safety and fall prevention for home. Pt planning to d/c home with intermittent supervision from family/friends. No further acute OT needs identified; signing off at this time. Please re-consult if needs change. Thank you for this referral.    Follow Up Recommendations  No OT follow up;Supervision - Intermittent    Equipment Recommendations  None recommended by OT    Recommendations for Other Services       Precautions / Restrictions Precautions Precautions: None Restrictions Weight Bearing Restrictions: No      Mobility Bed Mobility               General bed mobility comments: seated EoB at entry  Transfers Overall transfer level: Modified independent   Transfers: Sit to/from Stand Sit to Stand: Modified independent (Device/Increase time)         General transfer comment: good power up and steadying in standing     Balance Overall balance assessment: No apparent balance deficits (not formally assessed)                                         ADL either performed or assessed with clinical judgement   ADL Overall ADL's : Needs assistance/impaired Eating/Feeding: Independent;Sitting   Grooming: Supervision/safety;Standing   Upper Body Bathing: Set up;Sitting   Lower  Body Bathing: Supervison/ safety;Sit to/from stand   Upper Body Dressing : Set up;Sitting   Lower Body Dressing: Supervision/safety;Sit to/from stand Lower Body Dressing Details (indicate cue type and reason): Educated on L leg first into clothing. Pt able to adjust bil socks Toilet Transfer: Supervision/safety;Ambulation Toilet Transfer Details (indicate cue type and reason): simulated by sit to stand from EOB with functional mobility         Functional mobility during ADLs: Supervision/safety       Vision         Perception     Praxis      Pertinent Vitals/Pain Pain Assessment: No/denies pain     Hand Dominance     Extremity/Trunk Assessment Upper Extremity Assessment Upper Extremity Assessment: Generalized weakness   Lower Extremity Assessment Lower Extremity Assessment: Defer to PT evaluation    Cervical / Trunk Assessment Cervical / Trunk Assessment: Normal   Communication Communication Communication: No difficulties   Cognition Arousal/Alertness: Awake/alert Behavior During Therapy: WFL for tasks assessed/performed Overall Cognitive Status: Within Functional Limits for tasks assessed                                     General Comments  VSS    Exercises     Shoulder Instructions      Home Living Family/patient expects to be discharged to:: Private residence  Living Arrangements: Alone Available Help at Discharge: Family;Friend(s);Available PRN/intermittently;Neighbor Type of Home: Apartment Home Access: Level entry     Home Layout: One level     Bathroom Shower/Tub: Occupational psychologist: [place]: Environmental consultant - 2 wheels;Cane - single point;Shower seat;Grab bars - tub/shower          Prior Functioning/Environment Level of Independence: Independent with assistive device(s)        Comments: cane for mobility, mod I with ADL        OT Problem List:        OT Treatment/Interventions:       OT Goals(Current goals can be found in the care plan section) Acute Rehab OT Goals Patient Stated Goal: go home this afternoon OT Goal Formulation: All assessment and education complete, DC therapy  OT Frequency:     Barriers to D/C:            Co-evaluation              AM-PAC PT "6 Clicks" Daily Activity     Outcome Measure Help from another person eating meals?: None Help from another person taking care of personal grooming?: None Help from another person toileting, which includes using toliet, bedpan, or urinal?: A Little Help from another person bathing (including washing, rinsing, drying)?: A Little Help from another person to put on and taking off regular upper body clothing?: None Help from another person to put on and taking off regular lower body clothing?: A Little 6 Click Score: 21   End of Session Equipment Utilized During Treatment: Gait belt Nurse Communication: Mobility status  Activity Tolerance: Patient tolerated treatment well Patient left: Other (comment);with call bell/phone within reach (sitting EOB)  OT Visit Diagnosis: Unsteadiness on feet (R26.81);Other abnormalities of gait and mobility (R26.89)                Time: 8333-8329 OT Time Calculation (min): 8 min Charges:  OT General Charges $OT Visit: 1 Procedure OT Evaluation $OT Eval Moderate Complexity: 1 Procedure G-Codes:     Nichlos Kunzler A. Ulice Brilliant, M.S., OTR/L Pager: [place] 07/26/2016, 4:29 PM

## 2016-07-26 NOTE — Progress Notes (Addendum)
CRITICAL VALUE ALERT  Critical Value:  Potassium 6.3  Date & Time Notied:  07/26/2016 @ 04:15 AM  Provider Notified: Harold Barban, MD  Orders Received/Actions taken: Give 10 Units Novolog insulin subcutaneous, 1 Amp Dextrose 50% IV, and 30 gm Kayexalate by mouth once.  Lupita Dawn, RN

## 2016-07-26 NOTE — Progress Notes (Signed)
Upon entering room for report patient diaphoretic and complains of not feeling well. BP checked manually - 190/83. Blood glucose reading of 22. Given 25 mL of D50 per protocol and a cup of OJ. Patient blood glucose rose to 112. Pt given call bell, instructed to call if begins feeling poor again. Will continue to monitor.  Fritz Pickerel, RN

## 2016-07-26 NOTE — Discharge Instructions (Signed)
[facility]  Discharge instructions  Lower Extremity Surgery  Please refer to the following instruction for your post-procedure care. Your surgeon or physician assistant will discuss any changes with you.  Activity  You are encouraged to walk as much as you can. You can slowly return to normal activities during the month after your surgery. Avoid strenuous activity and heavy lifting until your doctor tells you it's OK. Avoid activities such as vacuuming or swinging a golf club. Do not drive until your doctor give the OK and you are no longer taking prescription pain medications. It is also normal to have difficulty with sleep habits, eating and bowel movement after surgery. These will go away with time.  Bathing/Showering  You may shower after you go home. Do not soak in a bathtub, hot tub, or swim until the incision heals completely.  Incision Care  Clean your incision with mild soap and water. Shower every day. Pat the area dry with a clean towel. You do not need a bandage unless otherwise instructed. Do not apply any ointments or creams to your incision. If you have open wounds you will be instructed how to care for them or a visiting nurse may be arranged for you. If you have staples or sutures along your incision they will be removed at your post-op appointment. You may have skin glue on your incision. Do not peel it off. It will come off on its own in about one week.  Wash the groin wound with soap and water daily and pat dry. (No tub bath-only shower)  Then put a dry gauze or washcloth in the groin to keep this area dry to help prevent wound infection.  Do this daily and as needed.  Do not use Vaseline or neosporin on your incisions.  Only use soap and water on your incisions and then protect and keep dry.   Diet  Resume your normal diet. There are no special food restrictions following this procedure. A low fat/ low cholesterol diet is recommended  for all patients with vascular disease. In order to heal from your surgery, it is CRITICAL to get adequate nutrition. Your body requires vitamins, minerals, and protein. Vegetables are the best source of vitamins and minerals. Vegetables also provide the perfect balance of protein. Processed food has little nutritional value, so try to avoid this.  Medications  Resume taking all your medications unless your doctor or nurse practitioner tells you not to. If your incision is causing pain, you may take over-the-counter pain relievers such as acetaminophen (Tylenol). If you were prescribed a stronger pain medication, please aware these medication can cause nausea and constipation. Prevent nausea by taking the medication with a snack or meal. Avoid constipation by drinking plenty of fluids and eating foods with high amount of fiber, such as fruits, vegetables, and grains. Take Colase 100 mg (an over-the-counter stool softener) twice a day as needed for constipation. Do not take Tylenol if you are taking prescription pain medications.  Follow Up  Our office will schedule a follow up appointment 2-3 weeks following discharge.  Please call us immediately for any of the following conditions  Severe or worsening pain in your legs or feet while at rest or while walking Increase pain, redness, warmth, or drainage (pus) from your incision site(s) Fever of 101 degree or higher The swelling in your leg with the bypass suddenly worsens and becomes more painful than when you were in the hospital If you have  instructed to feel your graft pulse then you should do so every day. If you can no longer feel this pulse, call the office immediately. Not all patients are given this instruction. ° °Leg swelling is common after leg bypass surgery. ° °The swelling should improve over a few months following surgery. To improve the swelling, you may elevate your legs above the level of your heart while you are sitting or  resting. Your surgeon or physician assistant may ask you to apply an ACE wrap or wear compression (TED) stockings to help to reduce swelling. ° °Reduce your risk of vascular disease ° °Stop smoking. If you would like help call [facility] at 1-800-QUIT-NOW (1-800-784-8669) or [place] at 336-586-4000. ° °Manage your cholesterol °Maintain a desired weight °Control your diabetes weight °Control your diabetes °Keep your blood pressure down ° °If you have any questions, please call the office at [phone] ° ° °

## 2016-07-26 NOTE — Procedures (Signed)
  I was present at this dialysis session, have reviewed the session itself and made  appropriate changes Kelly Splinter MD [place] pager [phone]   07/26/2016, 1:48 PM

## 2016-07-26 NOTE — Progress Notes (Signed)
PT Cancellation Note  Patient Details Name: [name] MRN: [id number] DOB: 10/24/1945   Cancelled Treatment:    Reason Eval/Treat Not Completed: (P) Patient at procedure or test/unavailable Pt currently at hemodialysis. PT will check back as able to complete evaluation. Thanks.  Nicle Connole B. Migdalia Dk PT, DPT Acute Rehabilitation  [phone] Pager [phone]  [place] 07/26/2016, 12:20 PM

## 2016-07-27 ENCOUNTER — Telehealth: Payer: Self-pay | Admitting: Vascular Surgery

## 2016-07-27 LAB — GLUCOSE, CAPILLARY: Glucose-Capillary: 22 mg/dL — CL (ref 65–99)

## 2016-07-27 NOTE — Telephone Encounter (Signed)
Sched appt 08/16/16 at 2:00. Lm on hm#.

## 2016-07-27 NOTE — Progress Notes (Signed)
Late entry for missed gcode    07/26/16 1626  OT Time Calculation  OT Start Time (ACUTE ONLY) 1502  OT Stop Time (ACUTE ONLY) 1510  OT Time Calculation (min) 8 min  OT G-codes **NOT FOR INPATIENT CLASS**  Functional Assessment Tool Used Clinical judgement  Functional Limitation Self care  Self Care Current Status (G6770) CI  Self Care Goal Status (H4035) CI  Self Care Discharge Status (C4818) CI  OT General Charges  $OT Visit 1 Procedure  OT Evaluation  $OT Eval Moderate Complexity 1 Procedure   Keegen Heffern A. Ulice Brilliant, M.S., OTR/L Pager: [phone]

## 2016-07-27 NOTE — Telephone Encounter (Signed)
-----   Message from Mena Goes, RN sent at 07/26/2016  3:57 PM EDT ----- Regarding: 2-3 weeks   ----- Message ----- From: Ansel Bong Sent: 07/26/2016   3:44 PM To: [facility] Charge Pool  S/p aborted left fem-pop bypass 07/25/16  F/u with Dr. Bridgett Larsson in 2-3 weeks  Thanks Maudie Mercury

## 2016-07-27 NOTE — Consult Note (Signed)
           [facility] CM Primary Care Navigator  07/27/2016  [place] 12-Aug-1945 [id number]   Went to seepatient at the bedsideto identify possible discharge needs but she was alreadydischarged.   Patient was discharged home yesterday per RN report.  Primary care provider's officecalled (Janece)to notify of patient's discharge and need for post hospital follow-up and transition of care. Reminded of patient's health issues needing follow-up as well.  Made aware to refer patient to [facility] care management ifdeemed appropriatefor services.    For questions, please contact:  Dannielle Huh, BSN, RN- [facility] Primary Care Navigator  Telephone: [phone] [place]

## 2016-07-28 DIAGNOSIS — D509 Iron deficiency anemia, unspecified: Secondary | ICD-10-CM | POA: Diagnosis not present

## 2016-07-28 DIAGNOSIS — D631 Anemia in chronic kidney disease: Secondary | ICD-10-CM | POA: Diagnosis not present

## 2016-07-28 DIAGNOSIS — N2581 Secondary hyperparathyroidism of renal origin: Secondary | ICD-10-CM | POA: Diagnosis not present

## 2016-07-28 DIAGNOSIS — E119 Type 2 diabetes mellitus without complications: Secondary | ICD-10-CM | POA: Diagnosis not present

## 2016-07-28 DIAGNOSIS — N186 End stage renal disease: Secondary | ICD-10-CM | POA: Diagnosis not present

## 2016-07-28 NOTE — Progress Notes (Signed)
Previously omitted G-codes    29-Jul-2016 1550  PT G-Codes **NOT FOR INPATIENT CLASS**  Functional Assessment Tool Used AM-PAC 6 Clicks Basic Mobility  Functional Limitation Mobility: Walking and moving around  Mobility: Walking and Moving Around Current Status (P8251) CI  Mobility: Walking and Moving Around Goal Status [phone]) CI  Mobility: Walking and Moving Around Discharge Status [phone]) CI   Benjamine Mola B. Migdalia Dk PT, DPT Acute Rehabilitation  [phone] Pager [phone]

## 2016-07-28 NOTE — Discharge Summary (Signed)
[facility] Discharge Summary  [name] [birth year] 71 y.o. female  [id number]  Admission Date: 07/25/2016  Discharge Date: 07/26/2016  Physician: Adele Barthel, MD  Admission Diagnosis: Peripheral Vascular Disease with Bilateral Claudication  I70.213  HPI:   This is a 71 y.o. female who presented with short distance intermittent claudication now lifestyle limiting. The patient's symptoms have progressed. The patient's treatment regimen currently included: maximal medical management and walking plan.Pt has undergone Cardiac and Pulmonary preop clearance.  Hospital Course:  The patient was admitted to the hospital and taken to the operating room on 07/25/2016 and underwent:   1. Left above-the-knee popliteal artery exposure 2. Left common femoral artery exposure 3. Aborted left femoropopliteal bypass  Intraoperative findings:   1.  Non-clampable distal above-the-knee popliteal artery 2.  Heavily calcified distal superficial femoral artery  3.  Non-clampable common femoral artery both proximally and distally extending into heavily calcified profunda femoral artery  4.  Diffuse dense scarring in left groin  The patient tolerated the procedure well and was transported to the PACU in stable condition.   The patient had hyperkalemia at 6.3 on POD 1. Nephrology was consulted for HD. Her incisions were clean and intact without hematoma. She was discharged following HD. Dr. Bridgett Larsson to discuss with Dr. Donzetta Matters regarding potential retrograde intervention options.   CBC    Component Value Date/Time   WBC 6.0 07/26/2016 0241   RBC 3.51 (L) 07/26/2016 0241   HGB 9.3 (L) 07/26/2016 0241   HCT 28.9 (L) 07/26/2016 0241   PLT 155 07/26/2016 0241   MCV 82.3 07/26/2016 0241   MCH 26.5 07/26/2016 0241   MCHC 32.2 07/26/2016 0241   RDW 14.4 07/26/2016 0241   LYMPHSABS 0.9 08/24/2015 0507   MONOABS 0.3 08/24/2015 0507   EOSABS 0.1 08/24/2015 0507   BASOSABS 0.0 08/24/2015  0507    BMET    Component Value Date/Time   NA 132 (L) 07/26/2016 0241   K 5.3 (H) 07/26/2016 0911   K 4.9 01/16/2011 1503   CL 92 (L) 07/26/2016 0241   CO2 28 07/26/2016 0241   GLUCOSE 114 (H) 07/26/2016 0241   BUN 44 (H) 07/26/2016 0241   CREATININE 10.04 (H) 07/26/2016 0241   CREATININE 4.16 (H) 06/04/2015 1654   CALCIUM 8.2 (L) 07/26/2016 0241   GFRNONAA 3 (L) 07/26/2016 0241   GFRNONAA 10 (L) 06/04/2015 1654   GFRAA 4 (L) 07/26/2016 0241   GFRAA 12 (L) 06/04/2015 1654     Discharge Instructions:   The patient is discharged to home with extensive instructions on wound care and progressive ambulation.  They are instructed not to drive or perform any heavy lifting until returning to see the physician in his office.  Discharge Instructions    Call MD for:  redness, tenderness, or signs of infection (pain, swelling, bleeding, redness, odor or green/yellow discharge around incision site)    Complete by:  As directed    Call MD for:  severe or increased pain, loss or decreased feeling  in affected limb(s)    Complete by:  As directed    Call MD for:  temperature >100.5    Complete by:  As directed    Driving Restrictions    Complete by:  As directed    No driving for 1 week and while on pain medication   Resume previous diet    Complete by:  As directed       Discharge Diagnosis:  Peripheral Vascular Disease with  Bilateral Claudication  I70.213  Secondary Diagnosis: Patient Active Problem List   Diagnosis Date Noted  . Atherosclerosis of native artery of right lower extremity with intermittent claudication ([place]) 07/25/2016  . Dyslipidemia 05/25/2016  . Coronary artery disease involving native coronary artery of native heart without angina pectoris 05/25/2016  . Atherosclerosis of native arteries of extremity with intermittent claudication ([place]) 12/16/2014  . Nausea vomiting and diarrhea 09/03/2014  . Hyperkalemia 09/03/2014  . Diarrhea 09/03/2014  . ESRD (end stage  renal disease) ([place])   . Pulmonary hypertension ([place]) 09/23/2012  . Hyperkalemia, diminished renal excretion 06/27/2012  . Generalized convulsive epilepsy without mention of intractable epilepsy 09/14/2011  . Epilepsy ([place]) 05/04/2011  . Physical deconditioning 05/04/2011  . HTN (hypertension) 04/30/2011  . Depression 04/30/2011  . GERD (gastroesophageal reflux disease) 04/30/2011  . Seizure ([place]) 04/29/2011  . Nonspecific abnormal electrocardiogram (ECG) (EKG) 02/23/2011  . Essential hypertension, malignant 05/11/2009  . Chronic diastolic heart failure ([place]) 05/11/2009  . TOBACCO USER 07/27/2008  . Coronary atherosclerosis 07/27/2008  . CVA 07/27/2008  . PVD 07/27/2008  . ESRD 07/27/2008  . OTHER AND UNSPECIFIED HYPERLIPIDEMIA 07/24/2008   Past Medical History:  Diagnosis Date  . Anemia    History  of Blood transfusion  . CAD (coronary artery disease)    stent to RCA  . Cancer ([place])    clear cell cancer, kidney  . Chronic renal insufficiency    On hemodialysis  . Complication of anesthesia 12/2010   pt is very confused, with AMS with anesthesia  . CVA 07/27/2008   CVA affected cognition and memory per family, no focal deficits.    . CVA (cerebral infarction) 2003   no apparent residual  . Diastolic congestive heart failure ([employer])   . Dyslipidemia   . Encephalopathy   . ESRD 07/27/2008   ESRD due to HTN and NSAID's, started hemodialysis in 2005 in [place], [place]. Went to [facility] from 2010 to 2012 and since 2012 has been getting dialysis at [facility] on [address] in [place] on a MWF schedule. First access with RUA AVG placed in [place]. Next and current access was LUA AVG placed by Dr. Lucky Cowboy in [place] in or around 2012. Has had 2 or 3 procedures on that graft since placed per family. She gets her access work done here in [place] now. She is allergic to heparin and does not get any heparin at dialysis; she had an allergic reaction apparently when in ICU in  the past.      . GERD (gastroesophageal reflux disease)   . Hyperlipidemia   . Hypertension   . Hypothyroidism   . Positive PPD    completed rifampin  . Pulmonary hypertension ([place])   . PVD (peripheral vascular disease) ([place])   . Seizures ([place])    last seizure 6 years ago  . Sleep apnea    Sleep Study 2008     Allergies as of 07/26/2016      Reactions   Ace Inhibitors Anaphylaxis, Rash   Heparin Other (See Comments)   MDs told her not to take after reaction in ICU   Iohexol Swelling, Other (See Comments)   1970s; passed out and had facial/tongue swelling.  Requires 13-hour prep with prednisone and benadryl   Phenytoin Other (See Comments)   Had reaction while in ICU; doesn't know.  MDs told her not to take ever again.   Wellbutrin [bupropion] Other (See Comments)   seizures   Meperidine Hcl Swelling, Rash  Makes tongue swell   Morphine Rash   Penicillins Hives, Rash   Has patient had a PCN reaction causing immediate rash, facial/tongue/throat swelling, SOB or lightheadedness with hypotension: Yes Has patient had a PCN reaction causing severe rash involving mucus membranes or skin necrosis: No Has patient had a PCN reaction that required hospitalization: No Has patient had a PCN reaction occurring within the last 10 years: No If all of the above answers are "NO", then may proceed with Cephalosporin use.   Valproic Acid And Related Other (See Comments)   Confusion   Iodinated Diagnostic Agents Other (See Comments)   Pre-meditate with benadryl and prednisone 3 times before appt.   Pentazocine Lactate Other (See Comments)   Patient does not remember reaction to this med ([place]).      Medication List    TAKE these medications   acetaminophen 500 MG tablet Commonly known as:  TYLENOL Take 1,000 mg by mouth daily as needed for mild pain.   albuterol 108 (90 Base) MCG/ACT inhaler Commonly known as:  PROVENTIL HFA;VENTOLIN HFA Inhale 1-2 puffs into the lungs every 6 (six)  hours as needed for wheezing or shortness of breath.   ambrisentan 5 MG tablet Commonly known as:  LETAIRIS TAKE 1 TABLET (5 MG) ORALLY DAILY. What changed:  how much to take  how to take this  when to take this  additional instructions   amLODipine 10 MG tablet Commonly known as:  NORVASC Take 10 mg by mouth at bedtime.   aspirin EC 81 MG tablet Take 81 mg by mouth daily.   carvedilol 25 MG tablet Commonly known as:  COREG Take 25 mg by mouth 2 (two) times daily with a meal.   cilostazol 100 MG tablet Commonly known as:  PLETAL Take 1 tablet (100 mg total) by mouth 2 (two) times daily before a meal.   diphenhydrAMINE 50 MG capsule Commonly known as:  BENADRYL Take 50 mg by mouth on 12/24/14, at 6:30 am. What changed:  how much to take  how to take this  when to take this  additional instructions   folic acid-vitamin b complex-vitamin c-selenium-zinc 3 MG Tabs tablet Take 1 tablet by mouth daily.   hydrALAZINE 25 MG tablet Commonly known as:  APRESOLINE Take 25 mg by mouth 3 (three) times daily.   isosorbide dinitrate 20 MG tablet Commonly known as:  ISORDIL Take 1 tablet (20 mg total) by mouth daily.   levETIRAcetam 500 MG tablet Commonly known as:  KEPPRA Take 500 mg by mouth daily at 8 pm.   levothyroxine 150 MCG tablet Commonly known as:  SYNTHROID, LEVOTHROID Take 150 mcg by mouth daily before breakfast.   nitroGLYCERIN 0.4 MG SL tablet Commonly known as:  NITROSTAT PLACE 1 TABLET (0.4 MG TOTAL) UNDER THE TONGUE EVERY 5 (FIVE) MINUTES AS NEEDED FOR CHEST PAIN. What changed:  additional instructions   omeprazole 20 MG tablet Commonly known as:  PRILOSEC OTC Take 20 mg by mouth daily.   pravastatin 40 MG tablet Commonly known as:  PRAVACHOL Take 1 tablet (40 mg total) by mouth at bedtime.   SENSIPAR 90 MG tablet Generic drug:  cinacalcet Take 90 mg by mouth every Monday, Wednesday, and Friday with hemodialysis.   sevelamer carbonate  800 MG tablet Commonly known as:  RENVELA Take 1,600-3,200 mg by mouth See admin instructions. 4 tabs TID with each meal, 2 tabs with each snack.   traMADol 50 MG tablet Commonly known as:  ULTRAM Take 1  tablet (50 mg total) by mouth every 12 (twelve) hours as needed for moderate pain.   traZODone 50 MG tablet Commonly known as:  DESYREL Take 50 mg by mouth at bedtime as needed for sleep.       Tramadol #20 No Refill  Disposition: Home  Patient's condition: is Good  Follow up: 1. Dr. Bridgett Larsson in 2 weeks   Virgina Jock, PA-C [facility] [phone] 07/28/2016  3:39 PM   Addendum  This patient underwent left above-the-knee popliteal artery exposure and left common femoral artery exposure.  I felt I had partial control of the above-the-knee popliteal artery despite severe calcification.  The left common femoral artery was not compressible and neither was the profunda femoral artery or superficial femoral artery.  Subsequently, I felt no intervention was possible.  All incisions were closed.  This patient's post-op course was unremarkable and the patient was discharged on POD #1.   Adele Barthel, MD, FACS [facility] [place] Office: [phone] Pager: [phone]  07/31/2016, 2:43 PM

## 2016-07-31 DIAGNOSIS — N2581 Secondary hyperparathyroidism of renal origin: Secondary | ICD-10-CM | POA: Diagnosis not present

## 2016-07-31 DIAGNOSIS — D509 Iron deficiency anemia, unspecified: Secondary | ICD-10-CM | POA: Diagnosis not present

## 2016-07-31 DIAGNOSIS — N186 End stage renal disease: Secondary | ICD-10-CM | POA: Diagnosis not present

## 2016-07-31 DIAGNOSIS — D631 Anemia in chronic kidney disease: Secondary | ICD-10-CM | POA: Diagnosis not present

## 2016-07-31 DIAGNOSIS — E119 Type 2 diabetes mellitus without complications: Secondary | ICD-10-CM | POA: Diagnosis not present

## 2016-08-01 ENCOUNTER — Telehealth: Payer: Self-pay | Admitting: *Deleted

## 2016-08-01 DIAGNOSIS — Z992 Dependence on renal dialysis: Secondary | ICD-10-CM | POA: Diagnosis not present

## 2016-08-01 DIAGNOSIS — E1129 Type 2 diabetes mellitus with other diabetic kidney complication: Secondary | ICD-10-CM | POA: Diagnosis not present

## 2016-08-01 DIAGNOSIS — N186 End stage renal disease: Secondary | ICD-10-CM | POA: Diagnosis not present

## 2016-08-01 NOTE — Telephone Encounter (Signed)
Patient called stating that the incision in her left groin is swollen and very "sore".  Patient denies fever or drainage. She states that she has been cleansing and drying the incision as instructed.  She has dialysis tomorrow, Wednesday, 08/02/2016 and can not schedule [facility] transportation.  I instruction patient to apply ice to the area and to elevate her legs while sitting and lying down, above the level of her heart.  She states that she will call the office tomorrow if she shows no sign of improvement.  Patient voiced understanding of the instructions.

## 2016-08-02 DIAGNOSIS — N186 End stage renal disease: Secondary | ICD-10-CM | POA: Diagnosis not present

## 2016-08-02 DIAGNOSIS — N2581 Secondary hyperparathyroidism of renal origin: Secondary | ICD-10-CM | POA: Diagnosis not present

## 2016-08-02 DIAGNOSIS — D509 Iron deficiency anemia, unspecified: Secondary | ICD-10-CM | POA: Diagnosis not present

## 2016-08-02 DIAGNOSIS — Z23 Encounter for immunization: Secondary | ICD-10-CM | POA: Diagnosis not present

## 2016-08-02 DIAGNOSIS — D631 Anemia in chronic kidney disease: Secondary | ICD-10-CM | POA: Diagnosis not present

## 2016-08-02 DIAGNOSIS — E119 Type 2 diabetes mellitus without complications: Secondary | ICD-10-CM | POA: Diagnosis not present

## 2016-08-03 ENCOUNTER — Ambulatory Visit: Payer: Medicare Other | Admitting: Pulmonary Disease

## 2016-08-03 ENCOUNTER — Ambulatory Visit: Payer: Medicare Other

## 2016-08-03 DIAGNOSIS — I871 Compression of vein: Secondary | ICD-10-CM | POA: Diagnosis not present

## 2016-08-03 DIAGNOSIS — T82858A Stenosis of vascular prosthetic devices, implants and grafts, initial encounter: Secondary | ICD-10-CM | POA: Diagnosis not present

## 2016-08-03 DIAGNOSIS — Z992 Dependence on renal dialysis: Secondary | ICD-10-CM | POA: Diagnosis not present

## 2016-08-03 DIAGNOSIS — N186 End stage renal disease: Secondary | ICD-10-CM | POA: Diagnosis not present

## 2016-08-05 DIAGNOSIS — Z23 Encounter for immunization: Secondary | ICD-10-CM | POA: Diagnosis not present

## 2016-08-05 DIAGNOSIS — N2581 Secondary hyperparathyroidism of renal origin: Secondary | ICD-10-CM | POA: Diagnosis not present

## 2016-08-05 DIAGNOSIS — N186 End stage renal disease: Secondary | ICD-10-CM | POA: Diagnosis not present

## 2016-08-05 DIAGNOSIS — D631 Anemia in chronic kidney disease: Secondary | ICD-10-CM | POA: Diagnosis not present

## 2016-08-05 DIAGNOSIS — D509 Iron deficiency anemia, unspecified: Secondary | ICD-10-CM | POA: Diagnosis not present

## 2016-08-05 DIAGNOSIS — E119 Type 2 diabetes mellitus without complications: Secondary | ICD-10-CM | POA: Diagnosis not present

## 2016-08-07 DIAGNOSIS — E119 Type 2 diabetes mellitus without complications: Secondary | ICD-10-CM | POA: Diagnosis not present

## 2016-08-07 DIAGNOSIS — D631 Anemia in chronic kidney disease: Secondary | ICD-10-CM | POA: Diagnosis not present

## 2016-08-07 DIAGNOSIS — D509 Iron deficiency anemia, unspecified: Secondary | ICD-10-CM | POA: Diagnosis not present

## 2016-08-07 DIAGNOSIS — N2581 Secondary hyperparathyroidism of renal origin: Secondary | ICD-10-CM | POA: Diagnosis not present

## 2016-08-07 DIAGNOSIS — Z23 Encounter for immunization: Secondary | ICD-10-CM | POA: Diagnosis not present

## 2016-08-07 DIAGNOSIS — N186 End stage renal disease: Secondary | ICD-10-CM | POA: Diagnosis not present

## 2016-08-08 ENCOUNTER — Encounter: Payer: Self-pay | Admitting: Vascular Surgery

## 2016-08-09 DIAGNOSIS — D631 Anemia in chronic kidney disease: Secondary | ICD-10-CM | POA: Diagnosis not present

## 2016-08-09 DIAGNOSIS — N2581 Secondary hyperparathyroidism of renal origin: Secondary | ICD-10-CM | POA: Diagnosis not present

## 2016-08-09 DIAGNOSIS — Z23 Encounter for immunization: Secondary | ICD-10-CM | POA: Diagnosis not present

## 2016-08-09 DIAGNOSIS — D509 Iron deficiency anemia, unspecified: Secondary | ICD-10-CM | POA: Diagnosis not present

## 2016-08-09 DIAGNOSIS — E119 Type 2 diabetes mellitus without complications: Secondary | ICD-10-CM | POA: Diagnosis not present

## 2016-08-09 DIAGNOSIS — N186 End stage renal disease: Secondary | ICD-10-CM | POA: Diagnosis not present

## 2016-08-10 ENCOUNTER — Encounter: Payer: Self-pay | Admitting: Family

## 2016-08-10 NOTE — Progress Notes (Signed)
    Postoperative Visit   History of Present Illness   [name] is a 71 y.o. 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  The patient's wounds are healing.  The patient notes short distance intermittent claudication.  The patient has been started on Pletal.  The patient is able to complete her activities of daily living with some difficulty.  Additionally the patient notes some swelling in left above-the-knee exposure.  Reportedly her LUA AVG is nearing the end of its duration (>7 year to date).   For VQI Use Only   PRE-ADM LIVING: Home  AMB STATUS: Ambulatory with Assistance   Physical Examination   Vitals:   08/16/16 1357  BP: 109/61  Pulse: 68  Resp: 16  Temp: 97.7 F (36.5 C)  TempSrc: Oral  SpO2: 94%  Weight: 174 lb 3 oz (79 kg)  Height: 5' 2.5" (1.588 m)    LLE: calf incision healed with ballotable fluid, groin incision healing well, both feet with intact motor and sensation with palpable pulse   Medical Decision Making   [name] is a 71 y.o. year old female who presents s/p failed L fem-pop BPG due to calciphylaxis.  I had a long discussion with this patient in regards to the findings intraoperative. Unfortunately, this type of severe arterial calcification appears to be near certain in long-term dialysis patients. I don't think its likely that endovascular intervention is going to be successful given >10 years of bilateral SFA stent occlusion.   The patient doesn't want any further interventions on her leg.  She is going to try to manage everything medical from now on. We discussed that the seroma in her above-the-knee exposure will usually go away in time and she notes it already appears to be regressing. In regards to her access, in the even of thrombosis, I would attempt a T&R, jumping around the current graft. Thank you for allowing [place] to participate in this patient's  care.   Adele Barthel, MD, FACS [facility] [place] Office: [phone] Pager: [phone]

## 2016-08-11 ENCOUNTER — Ambulatory Visit (INDEPENDENT_AMBULATORY_CARE_PROVIDER_SITE_OTHER): Payer: Self-pay | Admitting: Family

## 2016-08-11 ENCOUNTER — Encounter: Payer: Self-pay | Admitting: Family

## 2016-08-11 VITALS — BP 98/52 | HR 83 | Temp 98.6°F | Resp 18 | Ht 62.5 in | Wt 175.0 lb

## 2016-08-11 DIAGNOSIS — I70213 Atherosclerosis of native arteries of extremities with intermittent claudication, bilateral legs: Secondary | ICD-10-CM

## 2016-08-11 DIAGNOSIS — N2581 Secondary hyperparathyroidism of renal origin: Secondary | ICD-10-CM | POA: Diagnosis not present

## 2016-08-11 DIAGNOSIS — E119 Type 2 diabetes mellitus without complications: Secondary | ICD-10-CM | POA: Diagnosis not present

## 2016-08-11 DIAGNOSIS — S8012XA Contusion of left lower leg, initial encounter: Secondary | ICD-10-CM

## 2016-08-11 DIAGNOSIS — I6523 Occlusion and stenosis of bilateral carotid arteries: Secondary | ICD-10-CM

## 2016-08-11 DIAGNOSIS — D631 Anemia in chronic kidney disease: Secondary | ICD-10-CM | POA: Diagnosis not present

## 2016-08-11 DIAGNOSIS — D509 Iron deficiency anemia, unspecified: Secondary | ICD-10-CM | POA: Diagnosis not present

## 2016-08-11 DIAGNOSIS — Z992 Dependence on renal dialysis: Secondary | ICD-10-CM

## 2016-08-11 DIAGNOSIS — Z23 Encounter for immunization: Secondary | ICD-10-CM | POA: Diagnosis not present

## 2016-08-11 DIAGNOSIS — M79605 Pain in left leg: Secondary | ICD-10-CM

## 2016-08-11 DIAGNOSIS — N186 End stage renal disease: Secondary | ICD-10-CM

## 2016-08-11 DIAGNOSIS — I779 Disorder of arteries and arterioles, unspecified: Secondary | ICD-10-CM

## 2016-08-11 DIAGNOSIS — G8918 Other acute postprocedural pain: Secondary | ICD-10-CM

## 2016-08-11 DIAGNOSIS — Z87891 Personal history of nicotine dependence: Secondary | ICD-10-CM

## 2016-08-11 MED ORDER — OXYCODONE-ACETAMINOPHEN 5-325 MG PO TABS: 1.0000 | ORAL_TABLET | Freq: Four times a day (QID) | ORAL | 0 refills | Status: DC | PRN

## 2016-08-11 NOTE — Progress Notes (Signed)
Postoperative Visit   History of Present Illness  [name] is a 71 y.o. year old female who is s/p aborted left femoropopliteal bypass on 07-25-16. She had lifestyle limiting intermittent claudication (L>R).   Findings:  1. Non-clampable distal above-the-knee popliteal artery 2.  Heavily calcified distal superficial femoral artery  3.  Non-clampable common femoral artery both proximally and distally extending into heavily calcified profunda femoral artery.  She returns today with c/o pain from the hematoma adjacent to and posterior to the medial incision of her left leg. She states that an ice pack helped to resolve the pain and swelling in her left groin. Both left groin and left leg incision shave healed with no evidence of infection. She denies fever or chills but does c/o intermittent severe pain at the hematoma adjacent to the lower incision, especially when she stands, walks, or sits, least painful with legs elevated.   The patient is able to complete their activities of daily living.     She denies any known problems with arthritis or back problems.  AV graft HD access is in her left upper arm, MWF are HD days. She denies any steal sx's in her left upper extremity.   She denies non healing wounds in her LE's.  She reports a stroke, MI, and seizures about 2005, started HD at that time.  Pt states her long term use of ibuprofen caused her kidney failure.    Pt Diabetic: No Pt smoker: formersmoker, quit in December 2017(started at age 27 yrs)  Pt meds include: Statin :Yes Betablocker: Yes ASA: Yes Other anticoagulants/antiplatelets: no   For VQI Use Only  PRE-ADM LIVING: Home  AMB STATUS: Ambulatory with cane   Past Medical History:  Diagnosis Date  . Anemia    History  of Blood transfusion  . CAD (coronary artery disease)    stent to RCA  . Cancer ([place])    clear cell cancer, kidney  . Chronic renal insufficiency    On hemodialysis  .  Complication of anesthesia 12/2010   pt is very confused, with AMS with anesthesia  . CVA 07/27/2008   CVA affected cognition and memory per family, no focal deficits.    . CVA (cerebral infarction) 2003   no apparent residual  . Diastolic congestive heart failure ([place])   . Dyslipidemia   . Encephalopathy   . ESRD 07/27/2008   ESRD due to HTN and NSAID's, started hemodialysis in 2005 in [place], [place]. Went to [facility] from 2010 to 2012 and since 2012 has been getting dialysis at [facility] on [address] in [place] on a MWF schedule. First access with RUA AVG placed in [place]. Next and current access was LUA AVG placed by Dr. Lucky Cowboy in [place] in or around 2012. Has had 2 or 3 procedures on that graft since placed per family. She gets her access work done here in [place] now. She is allergic to heparin and does not get any heparin at dialysis; she had an allergic reaction apparently when in ICU in the past.      . GERD (gastroesophageal reflux disease)   . Hyperlipidemia   . Hypertension   . Hypothyroidism   . Positive PPD    completed rifampin  . Pulmonary hypertension ([place])   . PVD (peripheral vascular disease) ([place])   . Seizures ([place])    last seizure 6 years ago  . Sleep apnea    Sleep Study 2008    Past Surgical History:  Procedure Laterality Date  . ABDOMINAL AORTOGRAM W/LOWER EXTREMITY Bilateral 06/15/2016   Procedure: Abdominal Aortogram w/Lower Extremity;  Surgeon: Conrad Osgood, MD;  Location: [facility];  Service: Cardiovascular;  Laterality: Bilateral;  . ABDOMINAL HYSTERECTOMY    . APPENDECTOMY    . ARTERY EXPLORATION Left 07/25/2016   Procedure: ARTERY EXPLORATION LEFT ABOVE KNEE POPLITEAL AND GROIN;  Surgeon: Conrad Pawnee, MD;  Location: [facility];  Service: Vascular;  Laterality: Left;  . CARDIAC CATHETERIZATION     last 2016  . CHOLECYSTECTOMY    . D&Cs    . LEFT HEART CATHETERIZATION WITH CORONARY ANGIOGRAM N/A 02/22/2011   Procedure: LEFT  HEART CATHETERIZATION WITH CORONARY ANGIOGRAM;  Surgeon: Wellington Hampshire, MD;  Location: [facility];  Service: Cardiovascular;  Laterality: N/A;  . left nephrectomy    . NEPHRECTOMY Left    Malignant tumor  . PERIPHERAL VASCULAR CATHETERIZATION N/A 12/31/2014   Procedure: Abdominal Aortogram;  Surgeon: Conrad Gilcrest, MD;  Location: [facility];  Service: Cardiovascular;  Laterality: N/A;  . right ankle repair    . RIGHT HEART CATHETERIZATION N/A 01/29/2014   Procedure: RIGHT HEART CATH;  Surgeon: Larey Dresser, MD;  Location: [facility];  Service: Cardiovascular;  Laterality: N/A;  . TONSILLECTOMY    . VASCULAR SURGERY  11/2010   graft inserted to left arm    Social History   Social History  . Marital status: Divorced    Spouse name: N/A  . Number of children: 2  . Years of education: 16   Occupational History  . Retired Marine scientist   .  Retired   Social History Main Topics  . Smoking status: Former Smoker    Packs/day: 0.50    Years: 53.00    Types: Cigarettes    Quit date: 12/17/2015  . Smokeless tobacco: Never Used  . Alcohol use 0.0 oz/week     Comment: very rarely per pt  . Drug use: No     Comment: former marijuana use, several years  . Sexual activity: Not Currently    Birth control/ protection: Post-menopausal   Other Topics Concern  . Not on file   Social History Narrative  . No narrative on file    Allergies  Allergen Reactions  . Ace Inhibitors Anaphylaxis and Rash  . Heparin Other (See Comments)    MDs told her not to take after reaction in ICU  . Iohexol Swelling and Other (See Comments)    1970s; passed out and had facial/tongue swelling.  Requires 13-hour prep with prednisone and benadryl  . Phenytoin Other (See Comments)    Had reaction while in ICU; doesn't know.  MDs told her not to take ever again.  . Wellbutrin [Bupropion] Other (See Comments)    seizures  . Meperidine Hcl Swelling and Rash    Makes tongue swell  . Morphine Rash  .  Penicillins Hives and Rash    Has patient had a PCN reaction causing immediate rash, facial/tongue/throat swelling, SOB or lightheadedness with hypotension: Yes Has patient had a PCN reaction causing severe rash involving mucus membranes or skin necrosis: No Has patient had a PCN reaction that required hospitalization: No Has patient had a PCN reaction occurring within the last 10 years: No If all of the above answers are "NO", then may proceed with Cephalosporin use.    . Valproic Acid And Related Other (See Comments)    Confusion   . Iodinated Diagnostic Agents Other (See  Comments)    Pre-meditate with benadryl and prednisone 3 times before appt.  . Pentazocine Lactate Other (See Comments)    Patient does not remember reaction to this med ([place]).     Current Outpatient Prescriptions on File Prior to Visit  Medication Sig Dispense Refill  . acetaminophen (TYLENOL) 500 MG tablet Take 1,000 mg by mouth daily as needed for mild pain.     [name] ambrisentan (LETAIRIS) 5 MG tablet TAKE 1 TABLET (5 MG) ORALLY DAILY. (Patient taking differently: Take 5 mg by mouth daily. TAKE 1 TABLET (5 MG) ORALLY DAILY.) 30 tablet 5  . amLODipine (NORVASC) 10 MG tablet Take 10 mg by mouth at bedtime.     [name] aspirin EC 81 MG tablet Take 81 mg by mouth daily.    . carvedilol (COREG) 25 MG tablet Take 25 mg by mouth 2 (two) times daily with a meal.    . cilostazol (PLETAL) 100 MG tablet Take 1 tablet (100 mg total) by mouth 2 (two) times daily before a meal. 60 tablet 11  . diphenhydrAMINE (BENADRYL) 50 MG capsule Take 50 mg by mouth on 12/24/14, at 6:30 am. (Patient taking differently: Take 50 mg by mouth every Monday, Wednesday, and Friday. On dialysis days) 1 capsule 0  . folic acid-vitamin b complex-vitamin c-selenium-zinc (DIALYVITE) 3 MG TABS tablet Take 1 tablet by mouth daily.    . hydrALAZINE (APRESOLINE) 25 MG tablet Take 25 mg by mouth 3 (three) times daily.    . isosorbide dinitrate (ISORDIL) 20 MG  tablet Take 1 tablet (20 mg total) by mouth daily. 90 tablet 3  . levETIRAcetam (KEPPRA) 500 MG tablet Take 500 mg by mouth daily at 8 pm.    . levothyroxine (SYNTHROID, LEVOTHROID) 150 MCG tablet Take 150 mcg by mouth daily before breakfast.    . nitroGLYCERIN (NITROSTAT) 0.4 MG SL tablet PLACE 1 TABLET (0.4 MG TOTAL) UNDER THE TONGUE EVERY 5 (FIVE) MINUTES AS NEEDED FOR CHEST PAIN. (Patient taking differently: Place 0.4 mg under the tongue every 5 (five) minutes as needed for chest pain. For chest pain.) 25 tablet 0  . omeprazole (PRILOSEC OTC) 20 MG tablet Take 20 mg by mouth daily.    . pravastatin (PRAVACHOL) 40 MG tablet Take 1 tablet (40 mg total) by mouth at bedtime. 90 tablet 3  . SENSIPAR 90 MG tablet Take 90 mg by mouth every Monday, Wednesday, and Friday with hemodialysis.     [name] sevelamer carbonate (RENVELA) 800 MG tablet Take 1,600-3,200 mg by mouth See admin instructions. 4 tabs TID with each meal, 2 tabs with each snack.    . traMADol (ULTRAM) 50 MG tablet Take 1 tablet (50 mg total) by mouth every 12 (twelve) hours as needed for moderate pain. 20 tablet 0  . traZODone (DESYREL) 50 MG tablet Take 50 mg by mouth at bedtime as needed for sleep.     [name] albuterol (PROVENTIL HFA;VENTOLIN HFA) 108 (90 Base) MCG/ACT inhaler Inhale 1-2 puffs into the lungs every 6 (six) hours as needed for wheezing or shortness of breath. (Patient not taking: Reported on 08/11/2016) 1 Inhaler 5   No current facility-administered medications on file prior to visit.       Physical Examination  Vitals:   08/11/16 1434  BP: (!) 98/52  Pulse: 83  Resp: 18  Temp: 98.6 F (37 C)  TempSrc: Oral  SpO2: 95%  Weight: 175 lb (79.4 kg)  Height: 5' 2.5" (1.588 m)   Body mass index is 31.5  kg/m.  Left groin and left medial leg ncisions are healed. + Doppler signals in bilateral DP and PT pedal arteries.  Soft large hematoma posterior to left thigh medial incision which is healed with no signs of infection.     Medical Decision Making  KAIDENCE SANT is a 71 y.o. year old female who presents s/p aborted left femoropopliteal bypass on 07-25-16.  I discussed with Dr. Bridgett Larsson pt HPI and physical exam results. Warm moist compresses to left medial thigh incision hematoma, 3-4 times/day to facilitate mobilization of hematoma, and relieve the pain and pressure from the hematoma. Elevate legs to heart level or higher as often as possible.  Percocet 5-325, 1 tablet po every 6 hours prn pain, disp #15, 0 refills. We discussed measures to take to prevent constipation from narcotic use.  Pt states that she has taken Perocet in the past with no allergic reaction; she has documented allergic reaction to meperidine and morphine.  See Dr. Bridgett Larsson as scheduled on 08-16-16.   [name], Sharmon Leyden, RN, MSN, FNP-C [facility] Office: [phone]  08/11/2016, 3:34 PM  Clinic MD: Bridgett Larsson

## 2016-08-11 NOTE — Patient Instructions (Signed)

## 2016-08-14 DIAGNOSIS — D631 Anemia in chronic kidney disease: Secondary | ICD-10-CM | POA: Diagnosis not present

## 2016-08-14 DIAGNOSIS — Z23 Encounter for immunization: Secondary | ICD-10-CM | POA: Diagnosis not present

## 2016-08-14 DIAGNOSIS — E119 Type 2 diabetes mellitus without complications: Secondary | ICD-10-CM | POA: Diagnosis not present

## 2016-08-14 DIAGNOSIS — N2581 Secondary hyperparathyroidism of renal origin: Secondary | ICD-10-CM | POA: Diagnosis not present

## 2016-08-14 DIAGNOSIS — N186 End stage renal disease: Secondary | ICD-10-CM | POA: Diagnosis not present

## 2016-08-14 DIAGNOSIS — D509 Iron deficiency anemia, unspecified: Secondary | ICD-10-CM | POA: Diagnosis not present

## 2016-08-15 ENCOUNTER — Encounter: Payer: Self-pay | Admitting: Radiology

## 2016-08-16 ENCOUNTER — Ambulatory Visit (INDEPENDENT_AMBULATORY_CARE_PROVIDER_SITE_OTHER): Payer: Self-pay | Admitting: Vascular Surgery

## 2016-08-16 ENCOUNTER — Encounter: Payer: Self-pay | Admitting: Vascular Surgery

## 2016-08-16 VITALS — BP 109/61 | HR 68 | Temp 97.7°F | Resp 16 | Ht 62.5 in | Wt 174.2 lb

## 2016-08-16 DIAGNOSIS — D631 Anemia in chronic kidney disease: Secondary | ICD-10-CM | POA: Diagnosis not present

## 2016-08-16 DIAGNOSIS — N2581 Secondary hyperparathyroidism of renal origin: Secondary | ICD-10-CM | POA: Diagnosis not present

## 2016-08-16 DIAGNOSIS — D509 Iron deficiency anemia, unspecified: Secondary | ICD-10-CM | POA: Diagnosis not present

## 2016-08-16 DIAGNOSIS — E119 Type 2 diabetes mellitus without complications: Secondary | ICD-10-CM | POA: Diagnosis not present

## 2016-08-16 DIAGNOSIS — N186 End stage renal disease: Secondary | ICD-10-CM | POA: Diagnosis not present

## 2016-08-16 DIAGNOSIS — Z23 Encounter for immunization: Secondary | ICD-10-CM | POA: Diagnosis not present

## 2016-08-16 DIAGNOSIS — I70213 Atherosclerosis of native arteries of extremities with intermittent claudication, bilateral legs: Secondary | ICD-10-CM

## 2016-08-17 NOTE — Addendum Note (Signed)
Addended by: Lianne Cure A on: 08/17/2016 08:42 AM   Modules accepted: Orders

## 2016-08-18 DIAGNOSIS — N186 End stage renal disease: Secondary | ICD-10-CM | POA: Diagnosis not present

## 2016-08-18 DIAGNOSIS — N2581 Secondary hyperparathyroidism of renal origin: Secondary | ICD-10-CM | POA: Diagnosis not present

## 2016-08-18 DIAGNOSIS — D631 Anemia in chronic kidney disease: Secondary | ICD-10-CM | POA: Diagnosis not present

## 2016-08-18 DIAGNOSIS — E119 Type 2 diabetes mellitus without complications: Secondary | ICD-10-CM | POA: Diagnosis not present

## 2016-08-18 DIAGNOSIS — D509 Iron deficiency anemia, unspecified: Secondary | ICD-10-CM | POA: Diagnosis not present

## 2016-08-18 DIAGNOSIS — Z23 Encounter for immunization: Secondary | ICD-10-CM | POA: Diagnosis not present

## 2016-08-21 DIAGNOSIS — Z23 Encounter for immunization: Secondary | ICD-10-CM | POA: Diagnosis not present

## 2016-08-21 DIAGNOSIS — N186 End stage renal disease: Secondary | ICD-10-CM | POA: Diagnosis not present

## 2016-08-21 DIAGNOSIS — D631 Anemia in chronic kidney disease: Secondary | ICD-10-CM | POA: Diagnosis not present

## 2016-08-21 DIAGNOSIS — N2581 Secondary hyperparathyroidism of renal origin: Secondary | ICD-10-CM | POA: Diagnosis not present

## 2016-08-21 DIAGNOSIS — E119 Type 2 diabetes mellitus without complications: Secondary | ICD-10-CM | POA: Diagnosis not present

## 2016-08-21 DIAGNOSIS — D509 Iron deficiency anemia, unspecified: Secondary | ICD-10-CM | POA: Diagnosis not present

## 2016-08-23 DIAGNOSIS — N2581 Secondary hyperparathyroidism of renal origin: Secondary | ICD-10-CM | POA: Diagnosis not present

## 2016-08-23 DIAGNOSIS — D631 Anemia in chronic kidney disease: Secondary | ICD-10-CM | POA: Diagnosis not present

## 2016-08-23 DIAGNOSIS — Z23 Encounter for immunization: Secondary | ICD-10-CM | POA: Diagnosis not present

## 2016-08-23 DIAGNOSIS — E119 Type 2 diabetes mellitus without complications: Secondary | ICD-10-CM | POA: Diagnosis not present

## 2016-08-23 DIAGNOSIS — D509 Iron deficiency anemia, unspecified: Secondary | ICD-10-CM | POA: Diagnosis not present

## 2016-08-23 DIAGNOSIS — N186 End stage renal disease: Secondary | ICD-10-CM | POA: Diagnosis not present

## 2016-08-25 ENCOUNTER — Telehealth: Payer: Self-pay | Admitting: *Deleted

## 2016-08-25 DIAGNOSIS — D509 Iron deficiency anemia, unspecified: Secondary | ICD-10-CM | POA: Diagnosis not present

## 2016-08-25 DIAGNOSIS — E119 Type 2 diabetes mellitus without complications: Secondary | ICD-10-CM | POA: Diagnosis not present

## 2016-08-25 DIAGNOSIS — N186 End stage renal disease: Secondary | ICD-10-CM | POA: Diagnosis not present

## 2016-08-25 DIAGNOSIS — Z23 Encounter for immunization: Secondary | ICD-10-CM | POA: Diagnosis not present

## 2016-08-25 DIAGNOSIS — D631 Anemia in chronic kidney disease: Secondary | ICD-10-CM | POA: Diagnosis not present

## 2016-08-25 DIAGNOSIS — N2581 Secondary hyperparathyroidism of renal origin: Secondary | ICD-10-CM | POA: Diagnosis not present

## 2016-08-25 NOTE — Telephone Encounter (Signed)
[name] called back and said that her pain was all over but especially in the leg. She can't rest without having her Tramadol and has not used but 2 pills of her oxycodone "because she can't sleep while taking it". I have instructed her to contact her Doctor at [facility] for advice on what will help her sleep and be more comfortable. I told her that they should see her for this because we do not prescribe long term pain medicines and that medications for the elderly should be prescribed by the PCP when possible because of contraindications and special dosing. She got mad and hung up on me.

## 2016-08-25 NOTE — Telephone Encounter (Signed)
LMTCB, patient had called into Triage requesting a prescription for Tramadol instead of the Percocet 5-325mg  she received from our NP on 08-11-16. I will try to call her back later to assess her pain.

## 2016-08-28 DIAGNOSIS — D631 Anemia in chronic kidney disease: Secondary | ICD-10-CM | POA: Diagnosis not present

## 2016-08-28 DIAGNOSIS — N2581 Secondary hyperparathyroidism of renal origin: Secondary | ICD-10-CM | POA: Diagnosis not present

## 2016-08-28 DIAGNOSIS — D509 Iron deficiency anemia, unspecified: Secondary | ICD-10-CM | POA: Diagnosis not present

## 2016-08-28 DIAGNOSIS — Z23 Encounter for immunization: Secondary | ICD-10-CM | POA: Diagnosis not present

## 2016-08-28 DIAGNOSIS — N186 End stage renal disease: Secondary | ICD-10-CM | POA: Diagnosis not present

## 2016-08-28 DIAGNOSIS — E119 Type 2 diabetes mellitus without complications: Secondary | ICD-10-CM | POA: Diagnosis not present

## 2016-08-30 DIAGNOSIS — Z23 Encounter for immunization: Secondary | ICD-10-CM | POA: Diagnosis not present

## 2016-08-30 DIAGNOSIS — D509 Iron deficiency anemia, unspecified: Secondary | ICD-10-CM | POA: Diagnosis not present

## 2016-08-30 DIAGNOSIS — N2581 Secondary hyperparathyroidism of renal origin: Secondary | ICD-10-CM | POA: Diagnosis not present

## 2016-08-30 DIAGNOSIS — E119 Type 2 diabetes mellitus without complications: Secondary | ICD-10-CM | POA: Diagnosis not present

## 2016-08-30 DIAGNOSIS — D631 Anemia in chronic kidney disease: Secondary | ICD-10-CM | POA: Diagnosis not present

## 2016-08-30 DIAGNOSIS — N186 End stage renal disease: Secondary | ICD-10-CM | POA: Diagnosis not present

## 2016-09-01 DIAGNOSIS — D631 Anemia in chronic kidney disease: Secondary | ICD-10-CM | POA: Diagnosis not present

## 2016-09-01 DIAGNOSIS — N186 End stage renal disease: Secondary | ICD-10-CM | POA: Diagnosis not present

## 2016-09-01 DIAGNOSIS — E1129 Type 2 diabetes mellitus with other diabetic kidney complication: Secondary | ICD-10-CM | POA: Diagnosis not present

## 2016-09-01 DIAGNOSIS — N2581 Secondary hyperparathyroidism of renal origin: Secondary | ICD-10-CM | POA: Diagnosis not present

## 2016-09-01 DIAGNOSIS — Z23 Encounter for immunization: Secondary | ICD-10-CM | POA: Diagnosis not present

## 2016-09-01 DIAGNOSIS — E119 Type 2 diabetes mellitus without complications: Secondary | ICD-10-CM | POA: Diagnosis not present

## 2016-09-01 DIAGNOSIS — Z992 Dependence on renal dialysis: Secondary | ICD-10-CM | POA: Diagnosis not present

## 2016-09-01 DIAGNOSIS — D509 Iron deficiency anemia, unspecified: Secondary | ICD-10-CM | POA: Diagnosis not present

## 2016-09-04 DIAGNOSIS — N2581 Secondary hyperparathyroidism of renal origin: Secondary | ICD-10-CM | POA: Diagnosis not present

## 2016-09-04 DIAGNOSIS — E119 Type 2 diabetes mellitus without complications: Secondary | ICD-10-CM | POA: Diagnosis not present

## 2016-09-04 DIAGNOSIS — D631 Anemia in chronic kidney disease: Secondary | ICD-10-CM | POA: Diagnosis not present

## 2016-09-04 DIAGNOSIS — D509 Iron deficiency anemia, unspecified: Secondary | ICD-10-CM | POA: Diagnosis not present

## 2016-09-04 DIAGNOSIS — N186 End stage renal disease: Secondary | ICD-10-CM | POA: Diagnosis not present

## 2016-09-06 DIAGNOSIS — N186 End stage renal disease: Secondary | ICD-10-CM | POA: Diagnosis not present

## 2016-09-06 DIAGNOSIS — D631 Anemia in chronic kidney disease: Secondary | ICD-10-CM | POA: Diagnosis not present

## 2016-09-06 DIAGNOSIS — E119 Type 2 diabetes mellitus without complications: Secondary | ICD-10-CM | POA: Diagnosis not present

## 2016-09-06 DIAGNOSIS — D509 Iron deficiency anemia, unspecified: Secondary | ICD-10-CM | POA: Diagnosis not present

## 2016-09-06 DIAGNOSIS — N2581 Secondary hyperparathyroidism of renal origin: Secondary | ICD-10-CM | POA: Diagnosis not present

## 2016-09-08 ENCOUNTER — Ambulatory Visit: Payer: Medicare Other | Admitting: Vascular Surgery

## 2016-09-08 ENCOUNTER — Encounter (HOSPITAL_COMMUNITY): Payer: Medicare Other

## 2016-09-08 ENCOUNTER — Other Ambulatory Visit: Payer: Self-pay

## 2016-09-08 ENCOUNTER — Telehealth: Payer: Self-pay

## 2016-09-08 DIAGNOSIS — I739 Peripheral vascular disease, unspecified: Secondary | ICD-10-CM | POA: Diagnosis not present

## 2016-09-08 DIAGNOSIS — N2581 Secondary hyperparathyroidism of renal origin: Secondary | ICD-10-CM | POA: Diagnosis not present

## 2016-09-08 DIAGNOSIS — M2011 Hallux valgus (acquired), right foot: Secondary | ICD-10-CM | POA: Diagnosis not present

## 2016-09-08 DIAGNOSIS — L84 Corns and callosities: Secondary | ICD-10-CM | POA: Diagnosis not present

## 2016-09-08 DIAGNOSIS — D631 Anemia in chronic kidney disease: Secondary | ICD-10-CM | POA: Diagnosis not present

## 2016-09-08 DIAGNOSIS — B351 Tinea unguium: Secondary | ICD-10-CM | POA: Diagnosis not present

## 2016-09-08 DIAGNOSIS — M79671 Pain in right foot: Secondary | ICD-10-CM | POA: Diagnosis not present

## 2016-09-08 DIAGNOSIS — N186 End stage renal disease: Secondary | ICD-10-CM | POA: Diagnosis not present

## 2016-09-08 DIAGNOSIS — E119 Type 2 diabetes mellitus without complications: Secondary | ICD-10-CM | POA: Diagnosis not present

## 2016-09-08 DIAGNOSIS — D509 Iron deficiency anemia, unspecified: Secondary | ICD-10-CM | POA: Diagnosis not present

## 2016-09-08 NOTE — Telephone Encounter (Signed)
Pt called to report increased L leg pain . S/p artery exploration AK popliteal and groin. States that it is the size of a grapefruit and is extremely painful (7/10 intermittent). Denies fever/chills and states that she has been doing warm compresses and will continue. S/w Dr. Bridgett Larsson in the office and he advised to schedule her to have the hematoma evacuated. S/w Arbie Cookey and made her aware.

## 2016-09-08 NOTE — Telephone Encounter (Signed)
Discussed with Dr. Bridgett Larsson the plan for procedure.  Rec'd verbal order to schedule pt. For evacuation of seroma left leg on Tues., 9/11.   Phone call to pt.  Advised of recommendation for procedure on 9/11.  Pt. Agreed.  Gave pre-op instructions to pt.;  NPO at midnight on 9/10, only take morning doses of Aspirin, heart/ blood pressure and thyroid medications with sips of water, and to report to Admitting at [facility]. at 7:45 AM on 9/11.  Pt. Verb. Understanding.

## 2016-09-11 ENCOUNTER — Encounter (HOSPITAL_COMMUNITY): Payer: Self-pay | Admitting: *Deleted

## 2016-09-11 DIAGNOSIS — D631 Anemia in chronic kidney disease: Secondary | ICD-10-CM | POA: Diagnosis not present

## 2016-09-11 DIAGNOSIS — D509 Iron deficiency anemia, unspecified: Secondary | ICD-10-CM | POA: Diagnosis not present

## 2016-09-11 DIAGNOSIS — N2581 Secondary hyperparathyroidism of renal origin: Secondary | ICD-10-CM | POA: Diagnosis not present

## 2016-09-11 DIAGNOSIS — E119 Type 2 diabetes mellitus without complications: Secondary | ICD-10-CM | POA: Diagnosis not present

## 2016-09-11 DIAGNOSIS — N186 End stage renal disease: Secondary | ICD-10-CM | POA: Diagnosis not present

## 2016-09-11 NOTE — Progress Notes (Signed)
Pt denies any acute cardiopulmonary issues. Pt under the care of Dr. Percival Spanish, Cardiology. Pt made aware to stop taking vitamins, fish oil and herbal medications.  Do not take any NSAIDs ie: Ibuprofen, Advil, Naproxen (Aleve), Motrin, BC and Goody Powder. Arbie Cookey, nurse from [facility] clarified that it is okay for pt to take Pletal on DOS. Pt verbalized understanding of all pre-op instructions. Anesthesia asked to review pt history.

## 2016-09-11 NOTE — Progress Notes (Signed)
Anesthesia Chart Review: Patient is a 71 year old female scheduled for I&D of left upper leg seroma on 09/12/16 by Dr. Murray Hodgkins. She is s/p left above knee popliteal artery exposure and left CFA exposure with aborted left femoropopliteal artery bypass on 07/25/16 (due to "non-clampable", heavily calcified arteries).   History includes former smoker (quit 12/17/15), CAD s/p RCA stent [facility] ~ '08), stress induced cardiomyopathy 02/22/11 (in setting of HCAP), pulmonary hypertension, diastolic CHF, HTN, clear cell renal cancer s/p left radical nephrectomy 08/31/09, ESRD (on HD since '05; MWF [place] [facility] [place] CVA '03, PAD s/p bilateral iliac stents '02 and s/p aborted left FPBG 07/11/16, hypothyroidism, HLD, + PPD (s/p rifampin), GERD, seizure (> 5 years ago), anemia, OSA, appendectomy, hysterectomy, cholecystectomy. For anesthesia complications, she reported confusion/AMS. BMI is consistent with obesity.   Meds include albuterol, Letairis (ambrisentan; for pulmonary hypertension), amlodipine, aspirin 81 mg, Coreg, Pletal, ferric citrate, hydralazine, Isordil, Keppra, levothyroxine, nitroglycerin, Prilosec OTC, pravastatin, Percocet, Sensipar, Renvela, tramadol, trazodone, Dialyvite. She has multiple allergies (see list), including Heparin.   - PCP is listed as Dr. Glendale Chard.  - Cardiologist is Dr. Minus Breeding, last visit 5/2/418. He recommended preoperative stress test prior to FPBG (see below). Based on results he wrote, "The study does demonstrate an inferior scar but was a low risk study with no other areas of ischemia. Therefore, based on ACC/AHA guidelines, the patient would be at acceptable risk for the planned procedure without further cardiovascular testing."  - Pulmonologist is Dr. Simonne Maffucci, last 07/13/16 prior to [facility]. He wrote, "We know that her echocardiogram tends to overestimate her actual pulmonary pressures. She remains quite functional and reports no  symptoms on very low doses of Letairis. So given her stability I see no reason to change her medications or consider a further workup prior to general anesthesia.Marland KitchenMarland KitchenKeep taking Letairis as your're doing. We will arrange for a 6 minute walk test. From my standpoint it is find for you to undergo general anesthesia."  EKG 06/26/16: NSR with sinus arrhythmia, low voltage QRS, possible anterolateral infarct (age undetermined).  Nuclear stress test 07/11/16:   The left ventricular ejection fraction is moderately decreased (30-44%).  Nuclear stress EF: 43%.  There was no ST segment deviation noted during stress.  Defect 1: There is a medium defect of severe severity present in the basal inferior and apical inferior location.  This is a low risk study. Low risk stress nuclear study with inferior scar, mild peri-infarct ischemia and mildly reduced left ventricular global systolic function.  Echo 07/14/15: Study Conclusions - Left ventricle: The cavity size was normal. There was moderate concentric hypertrophy. Systolic function was mildly reduced. The estimated ejection fraction was in the range of 45% to 50%. Wall motion was normal; there were no regional wall motion abnormalities. - Aortic valve: Trileaflet; mildly thickened, mildly calcified leaflets. - Aorta: The aorta was moderately calcified. - Mitral valve: Severely calcified annulus. Severely thickened, severely calcified leaflets . There was mild regurgitation. - Left atrium: The atrium was moderately dilated. - Right ventricle: The cavity size was mildly dilated. Wall thickness was normal. - Tricuspid valve: There was severe regurgitation. - Pulmonic valve: There was moderate regurgitation. - Pulmonary arteries: Systolic pressure was severely increased. PA peak pressure: 86 mm Hg (S). - Pericardium, extracardiac: A small pericardial effusion was identified circumferential to the heart. There was no evidence  of hemodynamic compromise. Impressions: - EF has mildly improved when compared to prior. (Comparison echo 05/13/13: EF 35-40%, moderate MR,  severe TR, PA peak pressure 107 mmHg (S).)  RHC 01/29/14 (Indication: Severe pulmonary hypertension by echo measurement):  Procedural Findings: Hemodynamics (mmHg) RA mean 1 RV 52/1 PA 52/20, mean 30 PCWP mean 6 Oxygen saturations: PA 70% AO 95% Cardiac Output (Fick) 5.77 Cardiac Index (Fick) 3.32 PVR 4.2 WU Cardiac Output (Thermo) 5.12 Cardiac Index (Thermo) 2.94 PVR 4.7 WU Final Conclusions: Normal right and left heart filling pressures. Normal cardiac output. Moderate pulmonary arterial hypertension. Treatment per Dr Lake Bells. We would be glad to see her in clinic as well as needed, will wait for direction from Dr Lake Bells.   LHC 02/22/11: Coronary angiography: Coronary dominance:Right  Left Main: Normal in size with mild 10-20% distal stenosis.  Left Anterior Descending (LAD): Mildly calcified with mild diffuse atherosclerosis but no evidence of obstructive disease.  1st diagonal (D1): Normal in size with 30% proximal disease.  2nd diagonal (D2): Small in size.  3rd diagonal (D3): Small in size.  Circumflex (LCx): Normal in size and mildly calcified. There is mild diffuse atherosclerosis but no evidence of obstructive disease.  1st obtuse marginal: Very small in size.  2nd obtuse marginal: Medium in size with minor irregularities.  3rd obtuse marginal: Normal in size and free of significant disease.  Right Coronary Artery: The vessel is normal in size and appears to be dominant. An ostial stent is noted which appears to be protruding into the aorta at least a few millimeters. This made engaging the vessel selectively almost impossible. I was able to get nonselective shots with an AR-1 catheter. This showed that the vessel is patent with no significant in-stent restenosis. There seems to be a 30-  40% stenosis distal to the stent but otherwise no evidence of obstructive disease.  Left ventriculography: Left ventricular systolic function is moderately reduced , LVEF is estimated at 35-40 %.There is akinesis of the distal anterior, apex and distal inferior wall. The appearance is suggestive of stress-induced cardiomyopathy. Final Conclusions:  1. Patent RCA stent with no evidence of obstructive coronary artery disease. 2. Moderately reduced LV systolic function with an estimated ejection fraction 35-40% with an appearance suggestive of stress-induced cardiomyopathy. 3. Low left ventricular end-diastolic pressure.  Recommendations: The patient does not have evidence of obstructive coronary artery disease to explain her ECG changes. Her ECG changes and left ventricular angiography are suggestive of stress-induced cardiomyopathy. However, given her continued mental status changes, a CT scan of the head is recommended to rule out intracranial bleed as a cause of her current presentation. Continue treatment with Coreg and consider an echocardiogram in few days.   Carotid U/S 01/11/15: Irregular calcific plaque noted in the bilateral ICAs and ECAs with velocities consistent with a 1-39% diameter reduction. Due to calcific plaque shadowing plaque may be greater than velocities suggest.    PFTs 04/08/13: FVC 1.87 (81%), FEV1 1.44 (80%), DLCO unc 10.95 (47%).  She is a same day work-up, so labs will be done on arrival. Patient with known ESRD and pulmonary hypertension. She was recently cleared for FPBG following pulmonology evaluation and cardiac stress test. Unfortunately, this procedure was aborted due to unclampable arteries. Now with LLE seroma. Anesthesiologist to evaluate on the day of surgery to ensure no acute changes and labs acceptable for OR.    George Hugh [facility] Short Stay Center/Anesthesiology Phone [phone] 09/11/2016 1:54 PM

## 2016-09-12 ENCOUNTER — Encounter (HOSPITAL_COMMUNITY)
Admission: RE | Disposition: A | Discharge status: Home Health | Payer: Self-pay | Source: Ambulatory Visit | Attending: Vascular Surgery

## 2016-09-12 ENCOUNTER — Encounter (HOSPITAL_COMMUNITY): Payer: Self-pay | Admitting: Urology

## 2016-09-12 ENCOUNTER — Inpatient Hospital Stay (HOSPITAL_COMMUNITY)
Admission: RE | Admit: 2016-09-12 | Discharge: 2016-09-14 | DRG: 604 | Disposition: A | Discharge status: Home Health | Payer: Medicare Other | Source: Ambulatory Visit | Attending: Vascular Surgery | Admitting: Vascular Surgery

## 2016-09-12 ENCOUNTER — Ambulatory Visit (HOSPITAL_COMMUNITY): Payer: Medicare Other | Admitting: Vascular Surgery

## 2016-09-12 DIAGNOSIS — N2581 Secondary hyperparathyroidism of renal origin: Secondary | ICD-10-CM | POA: Diagnosis present

## 2016-09-12 DIAGNOSIS — Z90710 Acquired absence of both cervix and uterus: Secondary | ICD-10-CM

## 2016-09-12 DIAGNOSIS — Z992 Dependence on renal dialysis: Secondary | ICD-10-CM

## 2016-09-12 DIAGNOSIS — I132 Hypertensive heart and chronic kidney disease with heart failure and with stage 5 chronic kidney disease, or end stage renal disease: Secondary | ICD-10-CM | POA: Diagnosis not present

## 2016-09-12 DIAGNOSIS — I5032 Chronic diastolic (congestive) heart failure: Secondary | ICD-10-CM | POA: Diagnosis not present

## 2016-09-12 DIAGNOSIS — E669 Obesity, unspecified: Secondary | ICD-10-CM | POA: Diagnosis present

## 2016-09-12 DIAGNOSIS — Z8673 Personal history of transient ischemic attack (TIA), and cerebral infarction without residual deficits: Secondary | ICD-10-CM

## 2016-09-12 DIAGNOSIS — E039 Hypothyroidism, unspecified: Secondary | ICD-10-CM | POA: Diagnosis present

## 2016-09-12 DIAGNOSIS — G8918 Other acute postprocedural pain: Secondary | ICD-10-CM

## 2016-09-12 DIAGNOSIS — T148XXA Other injury of unspecified body region, initial encounter: Secondary | ICD-10-CM | POA: Diagnosis present

## 2016-09-12 DIAGNOSIS — I2721 Secondary pulmonary arterial hypertension: Secondary | ICD-10-CM | POA: Diagnosis not present

## 2016-09-12 DIAGNOSIS — I2510 Atherosclerotic heart disease of native coronary artery without angina pectoris: Secondary | ICD-10-CM | POA: Diagnosis present

## 2016-09-12 DIAGNOSIS — E785 Hyperlipidemia, unspecified: Secondary | ICD-10-CM | POA: Diagnosis not present

## 2016-09-12 DIAGNOSIS — Z9049 Acquired absence of other specified parts of digestive tract: Secondary | ICD-10-CM

## 2016-09-12 DIAGNOSIS — D631 Anemia in chronic kidney disease: Secondary | ICD-10-CM | POA: Diagnosis present

## 2016-09-12 DIAGNOSIS — Z955 Presence of coronary angioplasty implant and graft: Secondary | ICD-10-CM

## 2016-09-12 DIAGNOSIS — T888XXD Other specified complications of surgical and medical care, not elsewhere classified, subsequent encounter: Secondary | ICD-10-CM | POA: Diagnosis not present

## 2016-09-12 DIAGNOSIS — K219 Gastro-esophageal reflux disease without esophagitis: Secondary | ICD-10-CM | POA: Diagnosis present

## 2016-09-12 DIAGNOSIS — E1122 Type 2 diabetes mellitus with diabetic chronic kidney disease: Secondary | ICD-10-CM | POA: Diagnosis not present

## 2016-09-12 DIAGNOSIS — N186 End stage renal disease: Secondary | ICD-10-CM | POA: Diagnosis not present

## 2016-09-12 DIAGNOSIS — E1151 Type 2 diabetes mellitus with diabetic peripheral angiopathy without gangrene: Secondary | ICD-10-CM | POA: Diagnosis not present

## 2016-09-12 DIAGNOSIS — Z885 Allergy status to narcotic agent status: Secondary | ICD-10-CM

## 2016-09-12 DIAGNOSIS — S8012XA Contusion of left lower leg, initial encounter: Secondary | ICD-10-CM | POA: Diagnosis not present

## 2016-09-12 DIAGNOSIS — Z880 Allergy status to penicillin: Secondary | ICD-10-CM

## 2016-09-12 DIAGNOSIS — Z905 Acquired absence of kidney: Secondary | ICD-10-CM

## 2016-09-12 DIAGNOSIS — Z888 Allergy status to other drugs, medicaments and biological substances status: Secondary | ICD-10-CM

## 2016-09-12 DIAGNOSIS — G4730 Sleep apnea, unspecified: Secondary | ICD-10-CM | POA: Diagnosis present

## 2016-09-12 DIAGNOSIS — Z91041 Radiographic dye allergy status: Secondary | ICD-10-CM

## 2016-09-12 DIAGNOSIS — M79605 Pain in left leg: Secondary | ICD-10-CM

## 2016-09-12 DIAGNOSIS — F17210 Nicotine dependence, cigarettes, uncomplicated: Secondary | ICD-10-CM | POA: Diagnosis present

## 2016-09-12 DIAGNOSIS — Z8249 Family history of ischemic heart disease and other diseases of the circulatory system: Secondary | ICD-10-CM

## 2016-09-12 DIAGNOSIS — S7012XA Contusion of left thigh, initial encounter: Principal | ICD-10-CM | POA: Diagnosis present

## 2016-09-12 HISTORY — PX: HEMATOMA EVACUATION: SHX5118

## 2016-09-12 HISTORY — PX: APPLICATION OF WOUND VAC: SHX5189

## 2016-09-12 HISTORY — DX: Traumatic secondary and recurrent hemorrhage and seroma, initial encounter: T79.2XXA

## 2016-09-12 LAB — CBC
HEMATOCRIT: 33.1 % — AB (ref 36.0–46.0)
HEMOGLOBIN: 10.3 g/dL — AB (ref 12.0–15.0)
MCH: 26 pg (ref 26.0–34.0)
MCHC: 31.1 g/dL (ref 30.0–36.0)
MCV: 83.6 fL (ref 78.0–100.0)
PLATELETS: 184 10*3/uL (ref 150–400)
RBC: 3.96 MIL/uL (ref 3.87–5.11)
RDW: 16.7 % — ABNORMAL HIGH (ref 11.5–15.5)
WBC: 3.3 10*3/uL — ABNORMAL LOW (ref 4.0–10.5)

## 2016-09-12 LAB — POCT I-STAT 4, (NA,K, GLUC, HGB,HCT)
GLUCOSE: 81 mg/dL (ref 65–99)
HEMATOCRIT: 33 % — AB (ref 36.0–46.0)
Hemoglobin: 11.2 g/dL — ABNORMAL LOW (ref 12.0–15.0)
POTASSIUM: 5.5 mmol/L — AB (ref 3.5–5.1)
Sodium: 132 mmol/L — ABNORMAL LOW (ref 135–145)

## 2016-09-12 LAB — POTASSIUM: Potassium: 4.2 mmol/L (ref 3.5–5.1)

## 2016-09-12 LAB — BASIC METABOLIC PANEL
ANION GAP: 12 (ref 5–15)
BUN: 22 mg/dL — ABNORMAL HIGH (ref 6–20)
CO2: 28 mmol/L (ref 22–32)
Calcium: 8.3 mg/dL — ABNORMAL LOW (ref 8.9–10.3)
Chloride: 93 mmol/L — ABNORMAL LOW (ref 101–111)
Creatinine, Ser: 7.27 mg/dL — ABNORMAL HIGH (ref 0.44–1.00)
GFR, EST AFRICAN AMERICAN: 6 mL/min — AB (ref >60)
GFR, EST NON AFRICAN AMERICAN: 5 mL/min — AB (ref >60)
GLUCOSE: 251 mg/dL — AB (ref 65–99)
POTASSIUM: 4.9 mmol/L (ref 3.5–5.1)
Sodium: 133 mmol/L — ABNORMAL LOW (ref 135–145)

## 2016-09-12 SURGERY — EVACUATION HEMATOMA
Anesthesia: General | Site: Leg Upper | Laterality: Left

## 2016-09-12 MED ORDER — POTASSIUM CHLORIDE CRYS ER 20 MEQ PO TBCR: 20.0000 meq | EXTENDED_RELEASE_TABLET | Freq: Once | ORAL | Status: DC

## 2016-09-12 MED ORDER — DEXAMETHASONE SODIUM PHOSPHATE 10 MG/ML IJ SOLN
INTRAMUSCULAR | Status: DC | PRN
  Administered 2016-09-12: 5 mg via INTRAVENOUS

## 2016-09-12 MED ORDER — CARVEDILOL 12.5 MG PO TABS
25.0000 mg | ORAL_TABLET | Freq: Two times a day (BID) | ORAL | Status: DC
  Administered 2016-09-12: 25 mg via ORAL

## 2016-09-12 MED ORDER — CHLORHEXIDINE GLUCONATE CLOTH 2 % EX PADS: 6.0000 | MEDICATED_PAD | Freq: Once | CUTANEOUS | Status: DC

## 2016-09-12 MED ORDER — PRAVASTATIN SODIUM 40 MG PO TABS
40.0000 mg | ORAL_TABLET | Freq: Every day | ORAL | Status: DC
  Administered 2016-09-12 – 2016-09-13 (×2): 40 mg via ORAL
  Filled 2016-09-12 (×2): qty 1

## 2016-09-12 MED ORDER — DIPHENHYDRAMINE HCL 50 MG/ML IJ SOLN
12.5000 mg | INTRAMUSCULAR | Status: AC | PRN
  Administered 2016-09-12 (×2): 12.5 mg via INTRAVENOUS

## 2016-09-12 MED ORDER — ONDANSETRON HCL 4 MG/2ML IJ SOLN: 4.0000 mg | Freq: Four times a day (QID) | INTRAMUSCULAR | Status: DC | PRN

## 2016-09-12 MED ORDER — 0.9 % SODIUM CHLORIDE (POUR BTL) OPTIME
TOPICAL | Status: DC | PRN
  Administered 2016-09-12: 1000 mL

## 2016-09-12 MED ORDER — SODIUM CHLORIDE 0.9 % IV SOLN
INTRAVENOUS | Status: DC
  Administered 2016-09-12: 09:00:00 via INTRAVENOUS

## 2016-09-12 MED ORDER — OXYCODONE-ACETAMINOPHEN 5-325 MG PO TABS
1.0000 | ORAL_TABLET | ORAL | Status: DC | PRN
  Administered 2016-09-12 (×2): 2 via ORAL
  Filled 2016-09-12: qty 2

## 2016-09-12 MED ORDER — PROPOFOL 10 MG/ML IV BOLUS
INTRAVENOUS | Status: DC | PRN
  Administered 2016-09-12: 170 mg via INTRAVENOUS

## 2016-09-12 MED ORDER — VANCOMYCIN HCL IN DEXTROSE 1-5 GM/200ML-% IV SOLN: 1000.0000 mg | Freq: Two times a day (BID) | INTRAVENOUS | Status: DC

## 2016-09-12 MED ORDER — DIPHENHYDRAMINE HCL 50 MG/ML IJ SOLN
INTRAMUSCULAR | Status: AC
  Administered 2016-09-12: 12.5 mg via INTRAVENOUS
  Filled 2016-09-12: qty 1

## 2016-09-12 MED ORDER — RENA-VITE PO TABS
1.0000 | ORAL_TABLET | Freq: Every day | ORAL | Status: DC
  Administered 2016-09-12 – 2016-09-13 (×2): 1 via ORAL
  Filled 2016-09-12 (×2): qty 1

## 2016-09-12 MED ORDER — VANCOMYCIN HCL IN DEXTROSE 1-5 GM/200ML-% IV SOLN
1000.0000 mg | INTRAVENOUS | Status: AC
  Administered 2016-09-12: 1000 mg via INTRAVENOUS
  Filled 2016-09-12: qty 200

## 2016-09-12 MED ORDER — FENTANYL CITRATE (PF) 250 MCG/5ML IJ SOLN
INTRAMUSCULAR | Status: AC
  Filled 2016-09-12: qty 5

## 2016-09-12 MED ORDER — SEVELAMER CARBONATE 800 MG PO TABS: 1600.0000 mg | ORAL_TABLET | ORAL | Status: DC

## 2016-09-12 MED ORDER — NITROGLYCERIN 0.4 MG SL SUBL
0.4000 mg | SUBLINGUAL_TABLET | SUBLINGUAL | Status: DC | PRN
  Administered 2016-09-12: 0.4 mg via SUBLINGUAL
  Filled 2016-09-12: qty 1

## 2016-09-12 MED ORDER — FENTANYL CITRATE (PF) 100 MCG/2ML IJ SOLN
25.0000 ug | INTRAMUSCULAR | Status: DC | PRN
  Administered 2016-09-12 (×2): 50 ug via INTRAVENOUS

## 2016-09-12 MED ORDER — PROPOFOL 10 MG/ML IV BOLUS
INTRAVENOUS | Status: AC
  Filled 2016-09-12: qty 20

## 2016-09-12 MED ORDER — AMBRISENTAN 5 MG PO TABS: 5.0000 mg | ORAL_TABLET | Freq: Every day | ORAL | Status: DC

## 2016-09-12 MED ORDER — TRAZODONE HCL 50 MG PO TABS
50.0000 mg | ORAL_TABLET | Freq: Every evening | ORAL | Status: DC | PRN
  Administered 2016-09-12: 50 mg via ORAL
  Filled 2016-09-12: qty 1

## 2016-09-12 MED ORDER — SODIUM CHLORIDE 0.9 % IV SOLN
INTRAVENOUS | Status: DC
  Administered 2016-09-12: 19:00:00 via INTRAVENOUS

## 2016-09-12 MED ORDER — CARVEDILOL 25 MG PO TABS
25.0000 mg | ORAL_TABLET | Freq: Two times a day (BID) | ORAL | Status: DC
  Administered 2016-09-12 – 2016-09-14 (×4): 25 mg via ORAL
  Filled 2016-09-12 (×4): qty 1

## 2016-09-12 MED ORDER — HYDRALAZINE HCL 20 MG/ML IJ SOLN: 5.0000 mg | INTRAMUSCULAR | Status: DC | PRN

## 2016-09-12 MED ORDER — LABETALOL HCL 5 MG/ML IV SOLN: 10.0000 mg | INTRAVENOUS | Status: DC | PRN

## 2016-09-12 MED ORDER — LEVETIRACETAM 500 MG PO TABS
500.0000 mg | ORAL_TABLET | Freq: Every day | ORAL | Status: DC
  Administered 2016-09-12 – 2016-09-13 (×2): 500 mg via ORAL
  Filled 2016-09-12 (×2): qty 1

## 2016-09-12 MED ORDER — DEXAMETHASONE SODIUM PHOSPHATE 10 MG/ML IJ SOLN
INTRAMUSCULAR | Status: AC
  Filled 2016-09-12: qty 1

## 2016-09-12 MED ORDER — DIPHENHYDRAMINE HCL 25 MG PO CAPS
50.0000 mg | ORAL_CAPSULE | ORAL | Status: DC
  Administered 2016-09-13: 50 mg via ORAL
  Filled 2016-09-12: qty 2

## 2016-09-12 MED ORDER — CARVEDILOL 12.5 MG PO TABS
ORAL_TABLET | ORAL | Status: AC
  Administered 2016-09-12: 25 mg via ORAL
  Filled 2016-09-12: qty 2

## 2016-09-12 MED ORDER — DIPHENHYDRAMINE HCL 25 MG PO CAPS
50.0000 mg | ORAL_CAPSULE | Freq: Four times a day (QID) | ORAL | Status: DC | PRN
  Administered 2016-09-14: 50 mg via ORAL
  Filled 2016-09-12 (×2): qty 2

## 2016-09-12 MED ORDER — LIDOCAINE 2% (20 MG/ML) 5 ML SYRINGE
INTRAMUSCULAR | Status: AC
  Filled 2016-09-12: qty 5

## 2016-09-12 MED ORDER — ONDANSETRON HCL 4 MG/2ML IJ SOLN
INTRAMUSCULAR | Status: DC | PRN
  Administered 2016-09-12: 4 mg via INTRAVENOUS

## 2016-09-12 MED ORDER — FENTANYL CITRATE (PF) 100 MCG/2ML IJ SOLN
INTRAMUSCULAR | Status: AC
  Administered 2016-09-12: 50 ug via INTRAVENOUS
  Filled 2016-09-12: qty 2

## 2016-09-12 MED ORDER — CILOSTAZOL 100 MG PO TABS
100.0000 mg | ORAL_TABLET | Freq: Two times a day (BID) | ORAL | Status: DC
  Administered 2016-09-13 – 2016-09-14 (×3): 100 mg via ORAL
  Filled 2016-09-12 (×5): qty 1

## 2016-09-12 MED ORDER — ONDANSETRON HCL 4 MG/2ML IJ SOLN
INTRAMUSCULAR | Status: AC
  Filled 2016-09-12: qty 2

## 2016-09-12 MED ORDER — MIDAZOLAM HCL 2 MG/2ML IJ SOLN
INTRAMUSCULAR | Status: AC
  Filled 2016-09-12: qty 2

## 2016-09-12 MED ORDER — HYDRALAZINE HCL 25 MG PO TABS
25.0000 mg | ORAL_TABLET | Freq: Three times a day (TID) | ORAL | Status: DC
  Administered 2016-09-12 – 2016-09-14 (×5): 25 mg via ORAL
  Filled 2016-09-12 (×5): qty 1

## 2016-09-12 MED ORDER — LIDOCAINE 2% (20 MG/ML) 5 ML SYRINGE
INTRAMUSCULAR | Status: DC | PRN
  Administered 2016-09-12: 80 mg via INTRAVENOUS

## 2016-09-12 MED ORDER — PHENOL 1.4 % MT LIQD: 1.0000 | OROMUCOSAL | Status: DC | PRN

## 2016-09-12 MED ORDER — GUAIFENESIN-DM 100-10 MG/5ML PO SYRP: 15.0000 mL | ORAL_SOLUTION | ORAL | Status: DC | PRN

## 2016-09-12 MED ORDER — MIDAZOLAM HCL 5 MG/5ML IJ SOLN
INTRAMUSCULAR | Status: DC | PRN
  Administered 2016-09-12: 1 mg via INTRAVENOUS

## 2016-09-12 MED ORDER — SODIUM CHLORIDE 0.9 % IR SOLN
Status: DC | PRN
  Administered 2016-09-12: 1000 mL

## 2016-09-12 MED ORDER — OXYCODONE-ACETAMINOPHEN 5-325 MG PO TABS
ORAL_TABLET | ORAL | Status: AC
  Filled 2016-09-12: qty 2

## 2016-09-12 MED ORDER — ISOSORBIDE DINITRATE 20 MG PO TABS
20.0000 mg | ORAL_TABLET | Freq: Every day | ORAL | Status: DC
  Administered 2016-09-13 – 2016-09-14 (×2): 20 mg via ORAL
  Filled 2016-09-12 (×2): qty 1

## 2016-09-12 MED ORDER — ALUM & MAG HYDROXIDE-SIMETH 200-200-20 MG/5ML PO SUSP: 15.0000 mL | ORAL | Status: DC | PRN

## 2016-09-12 MED ORDER — METOPROLOL TARTRATE 5 MG/5ML IV SOLN
2.0000 mg | INTRAVENOUS | Status: DC | PRN
Start: 2016-09-12 — End: 2016-09-14

## 2016-09-12 MED ORDER — FENTANYL CITRATE (PF) 250 MCG/5ML IJ SOLN
INTRAMUSCULAR | Status: DC | PRN
  Administered 2016-09-12: 100 ug via INTRAVENOUS
  Administered 2016-09-12 (×3): 50 ug via INTRAVENOUS

## 2016-09-12 MED ORDER — FERRIC CITRATE 1 GM 210 MG(FE) PO TABS
420.0000 mg | ORAL_TABLET | Freq: Three times a day (TID) | ORAL | Status: DC
  Administered 2016-09-12 – 2016-09-13 (×2): 420 mg via ORAL
  Filled 2016-09-12 (×4): qty 2

## 2016-09-12 MED ORDER — PANTOPRAZOLE SODIUM 40 MG PO TBEC
40.0000 mg | DELAYED_RELEASE_TABLET | Freq: Every day | ORAL | Status: DC
  Administered 2016-09-13 – 2016-09-14 (×2): 40 mg via ORAL
  Filled 2016-09-12 (×2): qty 1

## 2016-09-12 MED ORDER — PHENYLEPHRINE 40 MCG/ML (10ML) SYRINGE FOR IV PUSH (FOR BLOOD PRESSURE SUPPORT)
PREFILLED_SYRINGE | INTRAVENOUS | Status: DC | PRN
  Administered 2016-09-12: 120 ug via INTRAVENOUS
  Administered 2016-09-12: 200 ug via INTRAVENOUS
  Administered 2016-09-12 (×2): 80 ug via INTRAVENOUS

## 2016-09-12 MED ORDER — AMBRISENTAN 5 MG PO TABS
5.0000 mg | ORAL_TABLET | Freq: Every day | ORAL | Status: DC
  Filled 2016-09-12: qty 1

## 2016-09-12 MED ORDER — PHENYLEPHRINE 40 MCG/ML (10ML) SYRINGE FOR IV PUSH (FOR BLOOD PRESSURE SUPPORT)
PREFILLED_SYRINGE | INTRAVENOUS | Status: AC
  Filled 2016-09-12: qty 10

## 2016-09-12 MED ORDER — CINACALCET HCL 30 MG PO TABS: 90.0000 mg | ORAL_TABLET | ORAL | Status: DC

## 2016-09-12 MED ORDER — AMLODIPINE BESYLATE 10 MG PO TABS
10.0000 mg | ORAL_TABLET | Freq: Every day | ORAL | Status: DC
  Administered 2016-09-12 – 2016-09-13 (×2): 10 mg via ORAL
  Filled 2016-09-12 (×2): qty 1

## 2016-09-12 MED ORDER — TRAMADOL HCL 50 MG PO TABS
50.0000 mg | ORAL_TABLET | Freq: Two times a day (BID) | ORAL | Status: DC | PRN
  Administered 2016-09-13 – 2016-09-14 (×2): 50 mg via ORAL
  Filled 2016-09-12 (×2): qty 1

## 2016-09-12 MED ORDER — LEVOTHYROXINE SODIUM 75 MCG PO TABS
150.0000 ug | ORAL_TABLET | Freq: Every day | ORAL | Status: DC
  Administered 2016-09-13 – 2016-09-14 (×2): 150 ug via ORAL
  Filled 2016-09-12 (×2): qty 2

## 2016-09-12 MED ORDER — ACETAMINOPHEN 500 MG PO TABS: 1000.0000 mg | ORAL_TABLET | Freq: Every day | ORAL | Status: DC | PRN

## 2016-09-12 MED ORDER — ASPIRIN EC 81 MG PO TBEC
81.0000 mg | DELAYED_RELEASE_TABLET | Freq: Every day | ORAL | Status: DC
  Administered 2016-09-13 – 2016-09-14 (×2): 81 mg via ORAL
  Filled 2016-09-12 (×2): qty 1

## 2016-09-12 SURGICAL SUPPLY — 24 items
CANISTER SUCT 3000ML PPV (MISCELLANEOUS) ×3 IMPLANT
CANISTER WOUND CARE 500ML ATS (WOUND CARE) ×3 IMPLANT
COVER SURGICAL LIGHT HANDLE (MISCELLANEOUS) ×3 IMPLANT
DRAPE HALF SHEET 40X57 (DRAPES) ×3 IMPLANT
DRAPE INCISE IOBAN 66X45 STRL (DRAPES) IMPLANT
DRAPE ORTHO SPLIT 77X108 STRL (DRAPES) ×4
DRAPE SURG ORHT 6 SPLT 77X108 (DRAPES) ×2 IMPLANT
DRSG VAC ATS SM SENSATRAC (GAUZE/BANDAGES/DRESSINGS) ×3 IMPLANT
ELECT REM PT RETURN 9FT ADLT (ELECTROSURGICAL) ×3
ELECTRODE REM PT RTRN 9FT ADLT (ELECTROSURGICAL) ×1 IMPLANT
GAUZE SPONGE 4X4 12PLY STRL (GAUZE/BANDAGES/DRESSINGS) ×3 IMPLANT
GLOVE BIO SURGEON STRL SZ7 (GLOVE) ×3 IMPLANT
GLOVE BIOGEL PI IND STRL 7.5 (GLOVE) ×1 IMPLANT
GLOVE BIOGEL PI INDICATOR 7.5 (GLOVE) ×2
GOWN STRL REUS W/ TWL LRG LVL3 (GOWN DISPOSABLE) ×3 IMPLANT
GOWN STRL REUS W/TWL LRG LVL3 (GOWN DISPOSABLE) ×6
KIT BASIN OR (CUSTOM PROCEDURE TRAY) ×3 IMPLANT
KIT ROOM TURNOVER OR (KITS) ×3 IMPLANT
NS IRRIG 1000ML POUR BTL (IV SOLUTION) ×3 IMPLANT
PACK GENERAL/GYN (CUSTOM PROCEDURE TRAY) ×3 IMPLANT
PAD ARMBOARD 7.5X6 YLW CONV (MISCELLANEOUS) ×6 IMPLANT
SWAB COLLECTION DEVICE MRSA (MISCELLANEOUS) ×3 IMPLANT
SWAB CULTURE ESWAB REG 1ML (MISCELLANEOUS) ×3 IMPLANT
WATER STERILE IRR 1000ML POUR (IV SOLUTION) ×3 IMPLANT

## 2016-09-12 NOTE — Transfer of Care (Signed)
Immediate Anesthesia Transfer of Care Note  Patient: Nalea Salce Tassin  Procedure(s) Performed: Procedure(s): EVACUATION HEMATOMA (Left) APPLICATION OF WOUND VAC (Left)  Patient Location: PACU  Anesthesia Type:General  Level of Consciousness: drowsy and patient cooperative  Airway & Oxygen Therapy: Patient Spontanous Breathing and Patient connected to face mask oxygen  Post-op Assessment: Report given to RN and Post -op Vital signs reviewed and stable  Post vital signs: Reviewed and stable  Last Vitals:  Vitals:   09/12/16 0752 09/12/16 1020  BP: (!) 150/56 (!) 177/75  Pulse: 73 72  Resp: 18 12  Temp: 36.6 C (!) 36.2 C  SpO2: 98% 100%    Last Pain:  Vitals:   09/12/16 1020  TempSrc:   PainSc: (P) 10-Worst pain ever      Patients Stated Pain Goal: 3 (70/34/03 5248)  Complications: No apparent anesthesia complications

## 2016-09-12 NOTE — Anesthesia Preprocedure Evaluation (Addendum)
Anesthesia Evaluation  Patient identified by MRN, date of birth, ID band Patient awake    Reviewed: Allergy & Precautions, NPO status , Patient's Chart, lab work & pertinent test results, reviewed documented beta blocker date and time   Airway Mallampati: II  TM Distance: >3 FB Neck ROM: Full    Dental  (+) Dental Advisory Given, Upper Dentures, Missing   Pulmonary sleep apnea , Current Smoker, former smoker,  PHTN   Pulmonary exam normal breath sounds clear to auscultation       Cardiovascular hypertension, Pt. on medications and Pt. on home beta blockers + CAD, + Cardiac Stents (RCA), + Peripheral Vascular Disease and +CHF  Normal cardiovascular exam Rhythm:Regular Rate:Normal  [place] 7/17: Study Conclusions  - Left ventricle: The cavity size was normal. There was moderate concentric hypertrophy. Systolic function was mildly reduced. The estimated ejection fraction was in the range of 45% to 50%. Wall motion was normal; there were no regional wall motion abnormalities. - Aortic valve: Trileaflet; mildly thickened, mildly calcified   leaflets. - Aorta: The aorta was moderately calcified. - Mitral valve: Severely calcified annulus. Severely thickened, severely calcified leaflets . There was mild regurgitation. - Left atrium: The atrium was moderately dilated. - Right ventricle: The cavity size was mildly dilated. Wall   thickness was normal. - Tricuspid valve: There was severe regurgitation. - Pulmonic valve: There was moderate regurgitation. - Pulmonary arteries: Systolic pressure was severely increased. PA peak pressure: 86 mm Hg (S). - Pericardium, extracardiac: A small pericardial effusion was identified circumferential to the heart. There was no evidence of hemodynamic compromise.  Impressions:  - EF has mildly improved when compared to prior.   Neuro/Psych Seizures -, Well Controlled,  PSYCHIATRIC DISORDERS Depression  CVA, No Residual Symptoms    GI/Hepatic Neg liver ROS, GERD  Medicated and Controlled,  Endo/Other  Hypothyroidism Obesity   Renal/GU ESRF and DialysisRenal disease (MWF)Renal ca K+ 5.0     Musculoskeletal negative musculoskeletal ROS (+)   Abdominal   Peds  Hematology  (+) Blood dyscrasia, anemia ,   Anesthesia Other Findings Day of surgery medications reviewed with the patient.  Reproductive/Obstetrics                             Anesthesia Physical  Anesthesia Plan  ASA: III  Anesthesia Plan: General   Post-op Pain Management:    Induction: Intravenous  PONV Risk Score and Plan: 3 and Ondansetron, Dexamethasone and Midazolam  Airway Management Planned: Oral ETT  Additional Equipment:   Intra-op Plan:   Post-operative Plan: Extubation in OR  Informed Consent: I have reviewed the patients History and Physical, chart, labs and discussed the procedure including the risks, benefits and alternatives for the proposed anesthesia with the patient or authorized representative who has indicated his/her understanding and acceptance.   Dental advisory given  Plan Discussed with: CRNA  Anesthesia Plan Comments: (No acute changes in health K+ is 5.5   We'll repeat to see if it might be lower as she had dialysis yesterday.  Okay to proceed )       Anesthesia Quick Evaluation

## 2016-09-12 NOTE — Op Note (Signed)
    OPERATIVE NOTE   PROCEDURE: 1. Evacuation of left thigh hematoma 2. Placement of negative pressure dressing  PRE-OPERATIVE DIAGNOSIS: symptomatic enlarging left distal thigh seroma  POST-OPERATIVE DIAGNOSIS: same as above   SURGEON: Adele Barthel, MD  ASSISTANT(S): Gerri Lins, PAC   ANESTHESIA: general  ESTIMATED BLOOD LOSS: 50 cc  FINDING(S): 1.  100 cc of liquidified hematoma 2.  No active bleeding  SPECIMEN(S):  none  INDICATIONS:   [name] is a 71 y.o. female who presents with enlarging left distal thigh seroma.  Patient previously has had a failed left common femoral artery to above-the-knee bypass.  She developed what was felt to be a seroma in the left distal thigh.  Reportedly patient is now having pain in the left distal thigh with enlarging size.  I recommended: evacuation of left thigh seroma and placement of negative pressure dressing.  The patient is aware the risks include but are not limited to: bleeding, infection, myocardial infarction, stroke, additional lymphatic vessel injury leading to need for additional procedures in the future, and wound complications. The patient is aware of the risks and agrees to proceed forward.  DESCRIPTION: After obtaining full informed written consent, the patient was brought back to the operating room and placed supine upon the operating table.  The patient received IV antibiotics prior to induction.  A procedure time out was completed and the correct surgical site was verified.  After obtaining adequate anesthesia, the patient was prepped and draped in the standard fashion for: left thigh seroma evacuation.  I made an incision over the prior distal thigh incision.  Pressurized liquidified hematoma drained upon entry into the hematoma.  I evacuated ~ 100 cc of hematoma.  There was minimal residual hematoma.  I washed out this hematoma cavity with 1 L of sterile normal saline with a Pulsavac.  There was no active bleeding.   The neurovascular bundle was not evident.  I sharply shaped a VAC sponge for this wound and packed it into the cavity.  I affixed adhesive strips over the sponge to secure the sponge.  I cut a hole over the sponge and attached the lilypad.  The VAC pump was attached to the [facility] circuit and engaged at 125 continuous.   COMPLICATIONS: none  CONDITION: stable   Adele Barthel, MD, [facility] [place] Office: [phone] Pager: [phone]  09/12/2016, 10:12 AM

## 2016-09-12 NOTE — H&P (Signed)
Brief History and Physical   History of Present Illness   [name] is a 71 y.o. female who presents with chief complaint: pain in Left above-the-knee popliteal artery exposure incision.  Patient thinks the swelling in her distal thigh has gotten worse.  She denies any fever or chills.  The patient presents today for evacuation of left distal thigh seroma..    Past Medical History:  Diagnosis Date  . Anemia    History  of Blood transfusion  . CAD (coronary artery disease)    stent to RCA  . Cancer ([place])    clear cell cancer, kidney  . Chronic renal insufficiency    On hemodialysis  . Complication of anesthesia 12/2010   pt is very confused, with AMS with anesthesia  . CVA 07/27/2008   CVA affected cognition and memory per family, no focal deficits.    . CVA (cerebral infarction) 2003   no apparent residual  . Diastolic congestive heart failure ([place])   . Dyslipidemia   . Encephalopathy   . ESRD 07/27/2008   ESRD due to HTN and NSAID's, started hemodialysis in 2005 in [place], [place]. Went to [facility] from 2010 to 2012 and since 2012 has been getting dialysis at [facility] on [address] in [place] on a MWF schedule. First access with RUA AVG placed in [place]. Next and current access was LUA AVG placed by Dr. Lucky Cowboy in [place] in or around 2012. Has had 2 or 3 procedures on that graft since placed per family. She gets her access work done here in [place] now. She is allergic to heparin and does not get any heparin at dialysis; she had an allergic reaction apparently when in ICU in the past.      . GERD (gastroesophageal reflux disease)   . Hyperlipidemia   . Hypertension   . Hypothyroidism   . Positive PPD    completed rifampin  . Pulmonary hypertension ([place])   . PVD (peripheral vascular disease) ([place])   . Seizures ([place])    last seizure 6 years ago  . Sleep apnea    Sleep Study 2008  . Traumatic seroma of left lower leg     Past Surgical History:    Procedure Laterality Date  . ABDOMINAL AORTOGRAM W/LOWER EXTREMITY Bilateral 06/15/2016   Procedure: Abdominal Aortogram w/Lower Extremity;  Surgeon: Conrad Beach, MD;  Location: [facility];  Service: Cardiovascular;  Laterality: Bilateral;  . ABDOMINAL HYSTERECTOMY    . APPENDECTOMY    . ARTERY EXPLORATION Left 07/25/2016   Procedure: ARTERY EXPLORATION LEFT ABOVE KNEE POPLITEAL AND GROIN;  Surgeon: Conrad [place], MD;  Location: [facility];  Service: Vascular;  Laterality: Left;  . CARDIAC CATHETERIZATION     last 2016  . CHOLECYSTECTOMY    . D&Cs    . LEFT HEART CATHETERIZATION WITH CORONARY ANGIOGRAM N/A 02/22/2011   Procedure: LEFT HEART CATHETERIZATION WITH CORONARY ANGIOGRAM;  Surgeon: Wellington Hampshire, MD;  Location: [facility];  Service: Cardiovascular;  Laterality: N/A;  . left nephrectomy    . NEPHRECTOMY Left    Malignant tumor  . PERIPHERAL VASCULAR CATHETERIZATION N/A 12/31/2014   Procedure: Abdominal Aortogram;  Surgeon: Conrad Whiterocks, MD;  Location: [facility];  Service: Cardiovascular;  Laterality: N/A;  . right ankle repair    . RIGHT HEART CATHETERIZATION N/A 01/29/2014   Procedure: RIGHT HEART CATH;  Surgeon: Larey Dresser, MD;  Location: [facility] C[facility];  Service: Cardiovascular;  Laterality: N/A;  . TONSILLECTOMY    . VASCULAR SURGERY  11/2010   graft inserted to left arm    Social History   Social History  . Marital status: Divorced    Spouse name: N/A  . Number of children: 2  . Years of education: 16   Occupational History  . Retired Marine scientist   .  Retired   Social History Main Topics  . Smoking status: Current Some Day Smoker    Packs/day: 0.00    Years: 53.00    Types: Cigarettes  . Smokeless tobacco: Never Used  . Alcohol use 0.0 oz/week     Comment: very rarely per pt  . Drug use: No     Comment: former marijuana use, several years  . Sexual activity: Not Currently    Birth control/ protection: Post-menopausal   Other Topics Concern   . Not on file   Social History Narrative  . No narrative on file    Family History  Problem Relation Age of Onset  . Heart disease Father   . Hypertension Mother   . Dementia Mother   . Coronary artery disease Sister   . Heart attack Sister   . Hypertension Brother     Current Facility-Administered Medications  Medication Dose Route Frequency Provider Last Rate Last Dose  . 0.9 %  sodium chloride infusion   Intravenous Continuous Conrad [place], MD 10 mL/hr at 09/12/16 0831    . Chlorhexidine Gluconate Cloth 2 % PADS 6 each  6 each Topical Once Conrad Maunaloa, MD      . vancomycin (VANCOCIN) IVPB 1000 mg/200 mL premix  1,000 mg Intravenous 60 min Pre-Op Conrad [place], MD        Allergies  Allergen Reactions  . Ace Inhibitors Anaphylaxis and Rash  . Heparin Other (See Comments)    MDs told her not to take after reaction in ICU  . Iohexol Swelling and Other (See Comments)    1970s; passed out and had facial/tongue swelling.  Requires 13-hour prep with prednisone and benadryl  . Phenytoin Other (See Comments)    Had reaction while in ICU; doesn't know.  MDs told her not to take ever again.  . Wellbutrin [Bupropion] Other (See Comments)    seizures  . Meperidine Hcl Swelling and Rash    Makes tongue swell  . Morphine Rash  . Penicillins Hives and Rash    Has patient had a PCN reaction causing immediate rash, facial/tongue/throat swelling, SOB or lightheadedness with hypotension: Yes Has patient had a PCN reaction causing severe rash involving mucus membranes or skin necrosis: No Has patient had a PCN reaction that required hospitalization: No Has patient had a PCN reaction occurring within the last 10 years: No If all of the above answers are "NO", then may proceed with Cephalosporin use.    . Valproic Acid And Related Other (See Comments)    Confusion   . Iodinated Diagnostic Agents Other (See Comments)    Pre-meditate with benadryl and prednisone 3 times before appt.   . Pentazocine Lactate Other (See Comments)    Patient does not remember reaction to this med ([place]).     Review of Systems: As listed above, otherwise negative.   Physical Examination   Vitals:   09/12/16 0752  BP: (!) 150/56  Pulse: 73  Resp: 18  Temp: 97.9 F (36.6 C)  TempSrc: Oral  SpO2: 98%  Weight: 160 lb (72.6 kg)  Height: 5' 2.5" (1.588 m)  Body mass index is 28.8 kg/m.  General Alert, O x 3, WD, NAD  Pulmonary Sym exp, good B air movt, CTA B  Cardiac RRR, Nl S1, S2, no Murmurs, No rubs, No S3,S4  Musculo- skeletal L distal thigh swelling: larger than previously, more tense to touch, viable left foot   Neurologic Pain and light touch intact in extremities,     Laboratory  See iStat   Medical Decision Making   [name] is a 71 y.o. female who presents with: L distal thigh seroma.   The patient is scheduled for: evacuation of left distal thigh seroma, placement of negative pressure dressing. The risk, benefits, and alternative for bypass operations were discussed with the patient.   The patient is aware the risks include but are not limited to: bleeding, infection, myocardial infarction, stroke, additional lymphatic vessel injury leading to need for additional procedures in the future, and wound complications.  The patient is aware of these risks and agreed to proceed.  The patient is aware of the risks and agrees to proceed.   Adele Barthel, MD, FACS [facility] [place] Office: [phone] Pager: [phone]  09/12/2016, 9:00 AM

## 2016-09-12 NOTE — Anesthesia Procedure Notes (Signed)
Procedure Name: LMA Insertion Date/Time: 09/12/2016 9:40 AM Performed by: Freddie Breech Pre-anesthesia Checklist: Patient identified, Emergency Drugs available, Suction available and Patient being monitored Patient Re-evaluated:Patient Re-evaluated prior to induction Oxygen Delivery Method: Circle System Utilized Preoxygenation: Pre-oxygenation with 100% oxygen Induction Type: IV induction Ventilation: Mask ventilation without difficulty LMA: LMA inserted LMA Size: 4.0 Number of attempts: 1 Airway Equipment and Method: Bite block Placement Confirmation: positive ETCO2 Tube secured with: Tape Dental Injury: Teeth and Oropharynx as per pre-operative assessment

## 2016-09-12 NOTE — Progress Notes (Signed)
Pharmacy note: post-op antibiotics  71 yo female s/p OR for vancomycin (last dose was today at ~ 10am).  She is noted with ESRD  Plan -With ESRD no additional vancomycin is needed for post-op antibiotics  Hildred Laser, Pharm D 09/12/2016 4:49 PM

## 2016-09-12 NOTE — Progress Notes (Signed)
Report given to steve shaw rn as caregiver 

## 2016-09-13 ENCOUNTER — Encounter (HOSPITAL_COMMUNITY): Payer: Self-pay | Admitting: Vascular Surgery

## 2016-09-13 DIAGNOSIS — Z8673 Personal history of transient ischemic attack (TIA), and cerebral infarction without residual deficits: Secondary | ICD-10-CM | POA: Diagnosis not present

## 2016-09-13 DIAGNOSIS — Z888 Allergy status to other drugs, medicaments and biological substances status: Secondary | ICD-10-CM | POA: Diagnosis not present

## 2016-09-13 DIAGNOSIS — I5032 Chronic diastolic (congestive) heart failure: Secondary | ICD-10-CM | POA: Diagnosis present

## 2016-09-13 DIAGNOSIS — Z8249 Family history of ischemic heart disease and other diseases of the circulatory system: Secondary | ICD-10-CM | POA: Diagnosis not present

## 2016-09-13 DIAGNOSIS — E669 Obesity, unspecified: Secondary | ICD-10-CM | POA: Diagnosis present

## 2016-09-13 DIAGNOSIS — N2581 Secondary hyperparathyroidism of renal origin: Secondary | ICD-10-CM | POA: Diagnosis present

## 2016-09-13 DIAGNOSIS — E1151 Type 2 diabetes mellitus with diabetic peripheral angiopathy without gangrene: Secondary | ICD-10-CM | POA: Diagnosis present

## 2016-09-13 DIAGNOSIS — N186 End stage renal disease: Secondary | ICD-10-CM | POA: Diagnosis present

## 2016-09-13 DIAGNOSIS — K219 Gastro-esophageal reflux disease without esophagitis: Secondary | ICD-10-CM | POA: Diagnosis present

## 2016-09-13 DIAGNOSIS — E039 Hypothyroidism, unspecified: Secondary | ICD-10-CM | POA: Diagnosis present

## 2016-09-13 DIAGNOSIS — F17210 Nicotine dependence, cigarettes, uncomplicated: Secondary | ICD-10-CM | POA: Diagnosis present

## 2016-09-13 DIAGNOSIS — Z955 Presence of coronary angioplasty implant and graft: Secondary | ICD-10-CM | POA: Diagnosis not present

## 2016-09-13 DIAGNOSIS — S7012XA Contusion of left thigh, initial encounter: Secondary | ICD-10-CM | POA: Diagnosis present

## 2016-09-13 DIAGNOSIS — E785 Hyperlipidemia, unspecified: Secondary | ICD-10-CM | POA: Diagnosis present

## 2016-09-13 DIAGNOSIS — E1122 Type 2 diabetes mellitus with diabetic chronic kidney disease: Secondary | ICD-10-CM | POA: Diagnosis present

## 2016-09-13 DIAGNOSIS — Z905 Acquired absence of kidney: Secondary | ICD-10-CM | POA: Diagnosis not present

## 2016-09-13 DIAGNOSIS — I132 Hypertensive heart and chronic kidney disease with heart failure and with stage 5 chronic kidney disease, or end stage renal disease: Secondary | ICD-10-CM | POA: Diagnosis present

## 2016-09-13 DIAGNOSIS — I2510 Atherosclerotic heart disease of native coronary artery without angina pectoris: Secondary | ICD-10-CM | POA: Diagnosis present

## 2016-09-13 DIAGNOSIS — Z992 Dependence on renal dialysis: Secondary | ICD-10-CM | POA: Diagnosis not present

## 2016-09-13 DIAGNOSIS — Z90710 Acquired absence of both cervix and uterus: Secondary | ICD-10-CM | POA: Diagnosis not present

## 2016-09-13 DIAGNOSIS — I2721 Secondary pulmonary arterial hypertension: Secondary | ICD-10-CM | POA: Diagnosis present

## 2016-09-13 DIAGNOSIS — I120 Hypertensive chronic kidney disease with stage 5 chronic kidney disease or end stage renal disease: Secondary | ICD-10-CM | POA: Diagnosis not present

## 2016-09-13 DIAGNOSIS — G4730 Sleep apnea, unspecified: Secondary | ICD-10-CM | POA: Diagnosis present

## 2016-09-13 DIAGNOSIS — D631 Anemia in chronic kidney disease: Secondary | ICD-10-CM | POA: Diagnosis present

## 2016-09-13 DIAGNOSIS — Z9049 Acquired absence of other specified parts of digestive tract: Secondary | ICD-10-CM | POA: Diagnosis not present

## 2016-09-13 LAB — CBC
HCT: 32.1 % — ABNORMAL LOW (ref 36.0–46.0)
HEMOGLOBIN: 9.9 g/dL — AB (ref 12.0–15.0)
MCH: 25.6 pg — ABNORMAL LOW (ref 26.0–34.0)
MCHC: 30.8 g/dL (ref 30.0–36.0)
MCV: 83.2 fL (ref 78.0–100.0)
Platelets: 186 10*3/uL (ref 150–400)
RBC: 3.86 MIL/uL — ABNORMAL LOW (ref 3.87–5.11)
RDW: 16.8 % — ABNORMAL HIGH (ref 11.5–15.5)
WBC: 5.6 10*3/uL (ref 4.0–10.5)

## 2016-09-13 LAB — RENAL FUNCTION PANEL
ALBUMIN: 2.9 g/dL — AB (ref 3.5–5.0)
ANION GAP: 11 (ref 5–15)
BUN: 33 mg/dL — ABNORMAL HIGH (ref 6–20)
CALCIUM: 9.2 mg/dL (ref 8.9–10.3)
CO2: 31 mmol/L (ref 22–32)
CREATININE: 8.68 mg/dL — AB (ref 0.44–1.00)
Chloride: 92 mmol/L — ABNORMAL LOW (ref 101–111)
GFR, EST AFRICAN AMERICAN: 5 mL/min — AB (ref >60)
GFR, EST NON AFRICAN AMERICAN: 4 mL/min — AB (ref >60)
Glucose, Bld: 118 mg/dL — ABNORMAL HIGH (ref 65–99)
PHOSPHORUS: 5.9 mg/dL — AB (ref 2.5–4.6)
Potassium: 4.4 mmol/L (ref 3.5–5.1)
SODIUM: 134 mmol/L — AB (ref 135–145)

## 2016-09-13 MED ORDER — FERRIC CITRATE 1 GM 210 MG(FE) PO TABS
630.0000 mg | ORAL_TABLET | Freq: Three times a day (TID) | ORAL | Status: DC
  Administered 2016-09-13 – 2016-09-14 (×3): 630 mg via ORAL
  Filled 2016-09-13 (×4): qty 3

## 2016-09-13 MED ORDER — OXYCODONE-ACETAMINOPHEN 5-325 MG PO TABS
1.0000 | ORAL_TABLET | Freq: Four times a day (QID) | ORAL | Status: DC | PRN
  Administered 2016-09-13 – 2016-09-14 (×4): 1 via ORAL
  Filled 2016-09-13 (×3): qty 1

## 2016-09-13 MED ORDER — CALCITRIOL 0.25 MCG PO CAPS: 2.2500 ug | ORAL_CAPSULE | ORAL | Status: DC

## 2016-09-13 MED ORDER — OXYCODONE-ACETAMINOPHEN 5-325 MG PO TABS
ORAL_TABLET | ORAL | Status: AC
  Filled 2016-09-13: qty 1

## 2016-09-13 MED ORDER — DARBEPOETIN ALFA 100 MCG/0.5ML IJ SOSY: 100.0000 ug | PREFILLED_SYRINGE | INTRAMUSCULAR | Status: DC

## 2016-09-13 MED ORDER — AMBRISENTAN 5 MG PO TABS: 5.0000 mg | ORAL_TABLET | Freq: Every day | ORAL | Status: DC

## 2016-09-13 NOTE — Progress Notes (Signed)
Late entry:  @1411  Called PA for pain medication for patient in dialysis; see epic for new orders

## 2016-09-13 NOTE — Consult Note (Signed)
[facility] Renal Consultation Note  Indication for Consultation:  Management of ESRD/hemodialysis; anemia, hypertension/volume and secondary hyperparathyroidism  HPI: [name] is a 71 y.o. 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 Bridgett Larsson 07/2016 procedure with "Left above-the-knee popliteal artery exposure incision"  and  Dr. Bridgett Larsson admitted for  for evacuation of left distal thigh seroma..  Last Hd on schedule past Monday at [facility] . She reports pain  and swelling in L upper leg, denies , fevers,chills , sob, Chest pain, cough , Abd pain, N/V/D. She continues to smoke cigarettes "53 years"/ I may stop now.     Today post op day 1 Evacuation of left thigh hematoma/ with Wound vac.Left thigh pain resolving per Ms Crmdy .For HD  Today  On schedule     Past Medical History:  Diagnosis Date  . Anemia    History  of Blood transfusion  . CAD (coronary artery disease)    stent to RCA  . Cancer ([place])    clear cell cancer, kidney  . Chronic renal insufficiency    On hemodialysis  . Complication of anesthesia 12/2010   pt is very confused, with AMS with anesthesia  . CVA 07/27/2008   CVA affected cognition and memory per family, no focal deficits.    . CVA (cerebral infarction) 2003   no apparent residual  . Diastolic congestive heart failure ([place])   . Dyslipidemia   . Encephalopathy   . ESRD 07/27/2008   ESRD due to HTN and NSAID's, started hemodialysis in 2005 in [place], [place]. Went to [facility] from 2010 to 2012 and since 2012 has been getting dialysis at [facility] on [address] in [place] on a MWF schedule. First access with RUA AVG placed in [place]. Next and current access was LUA AVG placed by Dr. Lucky Cowboy in [place] in or around 2012.  Has had 2 or 3 procedures on that graft since placed per family. She gets her access work done here in [place] now. She is allergic to heparin and does not get any heparin at dialysis; she had an allergic reaction apparently when in ICU in the past.      . GERD (gastroesophageal reflux disease)   . Hyperlipidemia   . Hypertension   . Hypothyroidism   . Positive PPD    completed rifampin  . Pulmonary hypertension ([place])   . PVD (peripheral vascular disease) ([place])   . Seizures ([place])    last seizure 6 years ago  . Sleep apnea    Sleep Study 2008  . Traumatic seroma of left lower leg     Past Surgical History:  Procedure Laterality Date  . ABDOMINAL AORTOGRAM W/LOWER EXTREMITY Bilateral 06/15/2016   Procedure: Abdominal Aortogram w/Lower Extremity;  Surgeon: Conrad [place], MD;  Location: [facility];  Service: Cardiovascular;  Laterality: Bilateral;  . ABDOMINAL HYSTERECTOMY    . APPENDECTOMY    . ARTERY EXPLORATION Left 07/25/2016   Procedure: ARTERY EXPLORATION LEFT ABOVE KNEE POPLITEAL AND GROIN;  Surgeon: Conrad Bird-in-Hand, MD;  Location: [facility];  Service: Vascular;  Laterality: Left;  . CARDIAC CATHETERIZATION     last 2016  . CHOLECYSTECTOMY    . D&Cs    . LEFT HEART CATHETERIZATION WITH  CORONARY ANGIOGRAM N/A 02/22/2011   Procedure: LEFT HEART CATHETERIZATION WITH CORONARY ANGIOGRAM;  Surgeon: Wellington Hampshire, MD;  Location: [facility] CATH LAB;  Service: Cardiovascular;  Laterality: N/A;  . left nephrectomy    . NEPHRECTOMY Left    Malignant tumor  . PERIPHERAL VASCULAR CATHETERIZATION N/A 12/31/2014   Procedure: Abdominal Aortogram;  Surgeon: Conrad Paradise, MD;  Location: [facility];  Service: Cardiovascular;  Laterality: N/A;  . right ankle repair    . RIGHT HEART CATHETERIZATION N/A 01/29/2014   Procedure: RIGHT HEART CATH;  Surgeon: Larey Dresser, MD;  Location: [facility] CATH LAB;  Service: Cardiovascular;  Laterality: N/A;  . TONSILLECTOMY    . VASCULAR SURGERY  11/2010    graft inserted to left arm      Family History  Problem Relation Age of Onset  . Heart disease Father   . Hypertension Mother   . Dementia Mother   . Coronary artery disease Sister   . Heart attack Sister   . Hypertension Brother       reports that she has been smoking Cigarettes.  She has been smoking about 0.00 packs per day for the past 53.00 years. She has never used smokeless tobacco. She reports that she drinks alcohol. She reports that she does not use drugs.   Allergies  Allergen Reactions  . Ace Inhibitors Anaphylaxis and Rash  . Heparin Other (See Comments)    MDs told her not to take after reaction in ICU  . Iohexol Swelling and Other (See Comments)    1970s; passed out and had facial/tongue swelling.  Requires 13-hour prep with prednisone and benadryl  . Phenytoin Other (See Comments)    Had reaction while in ICU; doesn't know.  MDs told her not to take ever again.  . Wellbutrin [Bupropion] Other (See Comments)    seizures  . Meperidine Hcl Swelling and Rash    Makes tongue swell  . Morphine Rash  . Penicillins Hives and Rash    Has patient had a PCN reaction causing immediate rash, facial/tongue/throat swelling, SOB or lightheadedness with hypotension: Yes Has patient had a PCN reaction causing severe rash involving mucus membranes or skin necrosis: No Has patient had a PCN reaction that required hospitalization: No Has patient had a PCN reaction occurring within the last 10 years: No If all of the above answers are "NO", then may proceed with Cephalosporin use.    . Valproic Acid And Related Other (See Comments)    Confusion   . Iodinated Diagnostic Agents Other (See Comments)    Pre-meditate with benadryl and prednisone 3 times before appt.  . Pentazocine Lactate Other (See Comments)    Patient does not remember reaction to this med ([place]).     Prior to Admission medications   Medication Sig Start Date End Date Taking? Authorizing Provider   acetaminophen (TYLENOL) 500 MG tablet Take 1,000 mg by mouth daily as needed for mild pain.    Yes [provider]  ambrisentan (LETAIRIS) 5 MG tablet TAKE 1 TABLET (5 MG) ORALLY DAILY. Patient taking differently: Take 5 mg by mouth daily. TAKE 1 TABLET (5 MG) ORALLY DAILY. 11/24/15  Yes Juanito Doom, MD  amLODipine (NORVASC) 10 MG tablet Take 10 mg by mouth at bedtime.    Yes [provider]  aspirin EC 81 MG tablet Take 81 mg by mouth daily.   Yes [provider]  carvedilol (COREG) 25 MG tablet Take 25 mg by mouth 2 (  two) times daily with a meal.   Yes [provider]  cilostazol (PLETAL) 100 MG tablet Take 1 tablet (100 mg total) by mouth 2 (two) times daily before a meal. 05/24/16  Yes Conrad Glasgow, MD  diphenhydrAMINE (BENADRYL) 50 MG capsule Take 50 mg by mouth on 12/24/14, at 6:30 am. Patient taking differently: Take 50 mg by mouth every Monday, Wednesday, and Friday. On dialysis days 12/24/14  Yes Angelia Mould, MD  ferric citrate (AURYXIA) 1 GM 210 MG(Fe) tablet Take 420 mg by mouth 3 (three) times daily with meals. And 1 tablet with snacks   Yes [provider]  folic acid-vitamin b complex-vitamin c-selenium-zinc (DIALYVITE) 3 MG TABS tablet Take 1 tablet by mouth daily.   Yes [provider]  hydrALAZINE (APRESOLINE) 25 MG tablet Take 25 mg by mouth 3 (three) times daily.   Yes [provider]  isosorbide dinitrate (ISORDIL) 20 MG tablet Take 1 tablet (20 mg total) by mouth daily. 07/11/16  Yes Minus Breeding, MD  levETIRAcetam (KEPPRA) 500 MG tablet Take 500 mg by mouth daily at 8 pm.   Yes [provider]  levothyroxine (SYNTHROID, LEVOTHROID) 150 MCG tablet Take 150 mcg by mouth daily before breakfast.   Yes [provider]  nitroGLYCERIN (NITROSTAT) 0.4 MG SL tablet PLACE 1 TABLET (0.4 MG TOTAL) UNDER THE TONGUE EVERY 5 (FIVE) MINUTES AS NEEDED FOR CHEST PAIN. Patient taking differently:  Place 0.4 mg under the tongue every 5 (five) minutes as needed for chest pain. For chest pain. 06/12/16  Yes Juanito Doom, MD  omeprazole (PRILOSEC OTC) 20 MG tablet Take 20 mg by mouth daily.   Yes [provider]  pravastatin (PRAVACHOL) 40 MG tablet Take 1 tablet (40 mg total) by mouth at bedtime. 05/23/16  Yes Minus Breeding, MD  SENSIPAR 90 MG tablet Take 90 mg by mouth every Monday, Wednesday, and Friday with hemodialysis.  03/05/14  Yes [provider]  traMADol (ULTRAM) 50 MG tablet Take 1 tablet (50 mg total) by mouth every 12 (twelve) hours as needed for moderate pain. 07/26/16  Yes Rhyne, Hulen Shouts, PA-C  traZODone (DESYREL) 50 MG tablet Take 50 mg by mouth at bedtime as needed for sleep.    Yes [provider]  oxyCODONE-acetaminophen (PERCOCET/ROXICET) 5-325 MG tablet Take 1 tablet by mouth every 6 (six) hours as needed. 08/11/16   Nickel, Sharmon Leyden, NP    ZOX:[id number], diphenhydrAMINE, guaiFENesin-dextromethorphan, hydrALAZINE, labetalol, metoprolol tartrate, nitroGLYCERIN, ondansetron, phenol, traMADol, traZODone  Results for orders placed or performed during the hospital encounter of 09/12/16 (from the past 48 hour(s))  I-STAT 4, (NA,K, GLUC, HGB,HCT)     Status: Abnormal   Collection Time: 09/12/16  8:15 AM  Result Value Ref Range   Sodium 132 (L) 135 - 145 mmol/L   Potassium 5.5 (H) 3.5 - 5.1 mmol/L   Glucose, Bld 81 65 - 99 mg/dL   HCT 33.0 (L) 36.0 - 46.0 %   Hemoglobin 11.2 (L) 12.0 - 15.0 g/dL  Potassium     Status: None   Collection Time: 09/12/16  8:30 AM  Result Value Ref Range   Potassium 4.2 3.5 - 5.1 mmol/L  Aerobic/Anaerobic Culture (surgical/deep wound)     Status: None (Preliminary result)   Collection Time: 09/12/16 10:11 AM  Result Value Ref Range   Specimen Description FLUID LEFT UPPER LEG    Special Requests SWABS LEFT UPPER LEG HEMATOMA    Gram Stain  DEGENERATED CELLULAR MATERIAL PRESENT NO ORGANISMS SEEN     Culture PENDING    Report Status PENDING   CBC     Status: Abnormal   Collection Time: 09/12/16  7:26 PM  Result Value Ref Range   WBC 3.3 (L) 4.0 - 10.5 K/uL   RBC 3.96 3.87 - 5.11 MIL/uL   Hemoglobin 10.3 (L) 12.0 - 15.0 g/dL   HCT 33.1 (L) 36.0 - 46.0 %   MCV 83.6 78.0 - 100.0 fL   MCH 26.0 26.0 - 34.0 pg   MCHC 31.1 30.0 - 36.0 g/dL   RDW 16.7 (H) 11.5 - 15.5 %   Platelets 184 150 - 400 K/uL  Basic metabolic panel     Status: Abnormal   Collection Time: 09/12/16  7:26 PM  Result Value Ref Range   Sodium 133 (L) 135 - 145 mmol/L   Potassium 4.9 3.5 - 5.1 mmol/L   Chloride 93 (L) 101 - 111 mmol/L   CO2 28 22 - 32 mmol/L   Glucose, Bld 251 (H) 65 - 99 mg/dL   BUN 22 (H) 6 - 20 mg/dL   Creatinine, Ser 7.27 (H) 0.44 - 1.00 mg/dL   Calcium 8.3 (L) 8.9 - 10.3 mg/dL   GFR calc non Af Amer 5 (L) >60 mL/min   GFR calc Af Amer 6 (L) >60 mL/min    Comment: (NOTE) The eGFR has been calculated using the CKD EPI equation. This calculation has not been validated in all clinical situations. eGFR's persistently <60 mL/min signify possible Chronic Kidney Disease.    Anion gap 12 5 - 15     ROS: see hpi   Physical Exam: Vitals:   09/12/16 2230 09/13/16 0535  BP:  (!) 163/76  Pulse: 73 72  Resp: 15 18  Temp:  98.3 F (36.8 C)  SpO2: 95% 93%     General: Alert , thin chronically ill AAF , NAD, Cooperative  HEENT: [place] , MMM, Nonicteric , EOMI HTN retinopathy Neck: supple, no jvd PCL Heart: RRR 2/6 sem Lsb , no rub LV lift Lungs:  BS Decr at bases otherwise CTA Abdomen: BS =+, soft , NT, ND Extremities: Tr pedal edema, L Thigh with Wound vac/ Faint bilat Ped. Pulses Skin: no overt rash /warm dry  Neuro: Alert OX3 moves  All trem , no acute overt focal deficits  Dialysis Access: L arm Hero  Pos  Bruit   Dialysis Orders: Center: [facility]  on MWF . EDW 78kg HD Bath 2k, 2ca  Time 4hr  Heparin NO Access L hero      Calcitriol 2.35mg /HD,   Mircera 100 mg q 2 wks (given last 09/06/16)   Sensipar 90 mg po q HD  Assessment/Plan 1. ESRD -  HD MWF  Today hd  Can probably use heparin as HIT goes away in most patients 2. Evacuation of left distal thigh seroma/ With wound vac- RX per Dr. CBridgett Larsson 3. Hypertension/volume  - Bp ^, home bp   Meds( ? compliance at home follow up bp trend ) and uf on HD  4. Anemia  - hgb 10.3 esa  On hd next 9/17 /18  5. Metabolic bone disease -  Po vit d on hD, po sensipar on hd ,Binder Auryxia 6. Ascvd -  7. Copd? Tob. Abuse - DW pt aging need to dc smoikng  8. Ho CVA remote   DErnest Haber PA-C [phone]/12/2016, 9:49 AM I have seen and examined  this patient and agree with the plan of care seen, examined, eval, discussed with patient, PA, changes made .  , L 09/13/2016, 1:05 PM

## 2016-09-13 NOTE — Anesthesia Postprocedure Evaluation (Signed)
Anesthesia Post Note  Patient: Teriah Muela Tory  Procedure(s) Performed: Procedure(s) (LRB): EVACUATION HEMATOMA (Left) APPLICATION OF WOUND VAC (Left)     Patient location during evaluation: PACU Anesthesia Type: General Level of consciousness: awake and alert Pain management: pain level controlled Vital Signs Assessment: post-procedure vital signs reviewed and stable Respiratory status: spontaneous breathing, nonlabored ventilation, respiratory function stable and patient connected to nasal cannula oxygen Cardiovascular status: blood pressure returned to baseline and stable Postop Assessment: no signs of nausea or vomiting Anesthetic complications: no    Last Vitals:  Vitals:   09/13/16 1328 09/13/16 1400  BP: (!) 142/61 (!) 134/58  Pulse: 66 64  Resp:    Temp:    SpO2:      Last Pain:  Vitals:   09/13/16 1310  TempSrc: Oral  PainSc:                  Riccardo Dubin

## 2016-09-13 NOTE — Procedures (Signed)
I was present at this session.  I have reviewed the session itself and made appropriate changes.  LUA AVF, bp coming down with UF.  Access press ok.  Lindsee Labarre L 9/12/20183:27 PM

## 2016-09-13 NOTE — Care Management Note (Signed)
Case Management Note Marvetta Gibbons RN, BSN Unit 4E-Case Manager [phone]  Patient Details  Name: [name] MRN: [id number] Date of Birth: Jun 23, 1945  Subjective/Objective:   Pt admitted s/p hematoma evacuation with wound VAC placement              Action/Plan: PTA pt lived at home- referral received for [facility] need and home wound VAC- spoke with pt at bedside for [facility] agency choice- per pt she states that she has spoken with [facility] in past and would like to use them- have also spoken with Rickie with [facility] regarding home wound VAC needs- will fax order once home wound VAC form signed- it has been placed on shadow chart for signature. Referral called to [place] with [facility] for [facility] needs for Wound VAC- have received confirmation that referral has been accepted. Lennie Muckle has signed Wound VAC form and this has been faxed to [facility] to start process for home [facility] approval.  Will anticipate d/c tomorrow once home [facility] approved and delivered for discharge- CM will continue to follow.   Expected Discharge Date:     09/14/16             Expected Discharge Plan:  [place]  In-House Referral:  NA  Discharge planning Services  CM Consult  Post Acute Care Choice:  Durable Medical Equipment, Home Health Choice offered to:  Patient  DME Arranged:  Vac DME Agency:  [facility]  HH Arranged:  RN [place] Agency:  [facility]  Status of Service:  In process, will continue to follow  If discussed at Long Length of Stay Meetings, dates discussed:    Discharge Disposition: home/home health   Additional Comments:  [name], RN 09/13/2016, 11:33 AM

## 2016-09-13 NOTE — Progress Notes (Addendum)
[facility] [place]  Subjective  - Doing well over all.   Objective (!) 163/76 72 98.3 F (36.8 C) (Oral) 18 93%  Intake/Output Summary (Last 24 hours) at 09/13/16 0814 Last data filed at 09/12/16 1930  Gross per 24 hour  Intake            592.5 ml  Output               50 ml  Net            542.5 ml   Left thigh with wound vac to suction. Distally Active range of motion intact, foot warm well perfused     Assessment/Planning: POD # 1 PROCEDURE: 1. Evacuation of left thigh hematoma 2. Placement of negative pressure dressing She will stay over night to watch for bleeding.    Plan if stable to discharge home tomorrow.  Laurence Slate [facility] 09/13/2016 8:14 AM   Addendum  I have independently interviewed and examined the patient, and I agree with the physician assistant's findings.  Adherent VAC.    - take down VAC tomorrow to check wound. - Home VAC arrangement - Home tomorrow if bleeding controlled - HD today inpt   Adele Barthel, MD, FACS [facility] [place]: [phone] Pager: [phone]  09/13/2016, 8:28 AM   --  Laboratory Lab Results:  Recent Labs  09/12/16 0815 09/12/16 1926  WBC  --  3.3*  HGB 11.2* 10.3*  HCT 33.0* 33.1*  PLT  --  184   BMET  Recent Labs  09/12/16 0815 09/12/16 0830 09/12/16 1926  NA 132*  --  133*  K 5.5* 4.2 4.9  CL  --   --  93*  CO2  --   --  28  GLUCOSE 81  --  251*  BUN  --   --  22*  CREATININE  --   --  7.27*  CALCIUM  --   --  8.3*    COAG Lab Results  Component Value Date   INR 1.04 07/21/2016   INR 0.97 07/06/2016   INR 1.05 01/29/2014   No results found for: PTT

## 2016-09-14 ENCOUNTER — Telehealth: Payer: Self-pay | Admitting: Vascular Surgery

## 2016-09-14 MED ORDER — OXYCODONE HCL 5 MG PO TABS: 5.0000 mg | ORAL_TABLET | Freq: Four times a day (QID) | ORAL | 0 refills | Status: DC | PRN

## 2016-09-14 MED ORDER — DIPHENHYDRAMINE HCL 50 MG PO CAPS: 50.0000 mg | ORAL_CAPSULE | ORAL | Status: DC

## 2016-09-14 MED ORDER — HYDRALAZINE HCL 25 MG PO TABS: 25.0000 mg | ORAL_TABLET | Freq: Two times a day (BID) | ORAL | Status: DC

## 2016-09-14 NOTE — Progress Notes (Signed)
   Daily Progress Note   Assessment/Planning:   POD #2 s/p evacuation of L distal thigh hematoma/seroma   No active bleeding from wound  Ok to D/C home with [facility]   Subjective  - 2 Days Post-Op   No new complaints   Objective   Vitals:   09/13/16 1750 09/13/16 2017 09/13/16 2210 09/14/16 0454  BP: (!) 107/44 (!) 92/41 (!) 107/48 (!) 153/67  Pulse: 66 66  62  Resp: 12 16  13   Temp: 98.3 F (36.8 C) 98.9 F (37.2 C)  98.7 F (37.1 C)  TempSrc: Oral Oral  Oral  SpO2: 95% 99%  99%  Weight: 168 lb 6.9 oz (76.4 kg)   171 lb 15.3 oz (78 kg)  Height:         Intake/Output Summary (Last 24 hours) at 09/14/16 1025 Last data filed at 09/13/16 1750  Gross per 24 hour  Intake                0 ml  Output             3000 ml  Net            -3000 ml    PULM  CTAB  CV  RRR  GI  soft, NTND  VASC Distal L thigh: no active clot, no serous fluid evident, no granulation yet  NEURO DNVI    Laboratory   CBC CBC Latest Ref Rng & Units 09/13/2016 09/12/2016 09/12/2016  WBC 4.0 - 10.5 K/uL 5.6 3.3(L) -  Hemoglobin 12.0 - 15.0 g/dL 9.9(L) 10.3(L) 11.2(L)  Hematocrit 36.0 - 46.0 % 32.1(L) 33.1(L) 33.0(L)  Platelets 150 - 400 K/uL 186 184 -    BMET    Component Value Date/Time   NA 134 (L) 09/13/2016 1333   K 4.4 09/13/2016 1333   K 4.9 01/16/2011 1503   CL 92 (L) 09/13/2016 1333   CO2 31 09/13/2016 1333   GLUCOSE 118 (H) 09/13/2016 1333   BUN 33 (H) 09/13/2016 1333   CREATININE 8.68 (H) 09/13/2016 1333   CREATININE 4.16 (H) 06/04/2015 1654   CALCIUM 9.2 09/13/2016 1333   GFRNONAA 4 (L) 09/13/2016 1333   GFRNONAA 10 (L) 06/04/2015 1654   GFRAA 5 (L) 09/13/2016 1333   GFRAA 12 (L) 06/04/2015 1654     Adele Barthel, MD, FACS [facility] [place] Office: [phone] Pager: [phone]  09/14/2016, 10:25 AM

## 2016-09-14 NOTE — Progress Notes (Signed)
Subjective: Interval History: has complaints wants to go home.  Objective: Vital signs in last 24 hours: Temp:  [98.1 F (36.7 C)-98.9 F (37.2 C)] 98.7 F (37.1 C) (09/13 0454) Pulse Rate:  [61-68] 62 (09/13 0454) Resp:  [12-16] 13 (09/13 0454) BP: (92-158)/(41-67) 153/67 (09/13 0454) SpO2:  [95 %-99 %] 99 % (09/13 0454) Weight:  [76.4 kg (168 lb 6.9 oz)-79.4 kg (175 lb 0.7 oz)] 78 kg (171 lb 15.3 oz) (09/13 0454) Weight change: 6.824 kg (15 lb 0.7 oz)  Intake/Output from previous day: 09/12 0701 - 09/13 0700 In: 120 [P.O.:120] Out: 3000  Intake/Output this shift: No intake/output data recorded.  General appearance: alert, cooperative, no distress and moderately obese Resp: diminished breath sounds bilaterally Cardio: S1, S2 normal and systolic murmur: systolic ejection 2/6, crescendo and decrescendo at 2nd left intercostal space GI: obese, pos bs, soft, liver down 3 cm Extremities: VAC L thigh, avf LUA  Lab Results:  Recent Labs  09/12/16 1926 09/13/16 1332  WBC 3.3* 5.6  HGB 10.3* 9.9*  HCT 33.1* 32.1*  PLT 184 186   BMET:  Recent Labs  09/12/16 1926 09/13/16 1333  NA 133* 134*  K 4.9 4.4  CL 93* 92*  CO2 28 31  GLUCOSE 251* 118*  BUN 22* 33*  CREATININE 7.27* 8.68*  CALCIUM 8.3* 9.2   No results for input(s): PTH in the last 72 hours. Iron Studies: No results for input(s): IRON, TIBC, TRANSFERRIN, FERRITIN in the last 72 hours.  Studies/Results: No results found.  I have reviewed the patient's current medications.  Assessment/Plan: 1 ESRD  HD MWF do here, if not d/c 2 thigh seroma postop per VVS 3 Anemia.  esa 4 DM controlled 5  HTN lower meds 6 PVD P Hd, esa., lower dry, VAC    LOS: 1 day   Shaden Lacher L 09/14/2016,9:06 AM

## 2016-09-14 NOTE — Progress Notes (Signed)
[name] to be D/C'd Home per MD order. Discussed with the patient and all questions fully answered.    VVS, Skin clean, dry and intact without evidence of skin break down, no evidence of skin tears noted.  IV catheter discontinued intact. Site without signs and symptoms of complications. Dressing and pressure applied.  An After Visit Summary was printed and given to the patient.  Patient escorted via [place], and D/C home via private auto.  Cyndra Numbers  09/14/2016 5:29 PM

## 2016-09-14 NOTE — Care Management Note (Addendum)
Case Management Note Marvetta Gibbons RN, BSN Unit 4E-Case Manager [phone]  Patient Details  Name: [name] MRN: [id number] Date of Birth: 01/28/1945  Subjective/Objective:   Pt admitted s/p hematoma evacuation with wound VAC placement              Action/Plan: PTA pt lived at home- referral received for [facility] need and home wound VAC- spoke with pt at bedside for [facility] agency choice- per pt she states that she has spoken with [facility] in past and would like to use them- have also spoken with Rickie with [facility] regarding home wound VAC needs- will fax order once home wound VAC form signed- it has been placed on shadow chart for signature. Referral called to [place] with [facility] for [facility] needs for Wound VAC- have received confirmation that referral has been accepted. Lennie Muckle has signed Wound VAC form and this has been faxed to [facility] to start process for home [facility] approval.  Will anticipate d/c tomorrow once home [facility] approved and delivered for discharge- CM will continue to follow.   Expected Discharge Date:  09/14/16  09/14/16             Expected Discharge Plan:  [place]  In-House Referral:  NA  Discharge planning Services  CM Consult  Post Acute Care Choice:  Durable Medical Equipment, Home Health Choice offered to:  Patient  DME Arranged:  Vac DME Agency:  [facility]  HH Arranged:  RN [place] Agency:  [facility]  Status of Service:  Completed, signed off  If discussed at [place] of Stay Meetings, dates discussed:    Discharge Disposition: home/home health   Additional Comments:  09/14/16- 24- Marvetta Gibbons RN, CM- received notice that pt has been approved for [facility] home wound VAC and order has been released for delivery- purchase order # from [employer] with [place]- is [phone]- awaiting word from [facility] on delivery timeframe- wound VAC will be delivered this afternoon for discharge home today- pt and bedside RN aware- pt will be discharge  home once wound VAC delivered and VAC applied. HHRN has been arranged with [facility] 1550-Per [facility]- wound VAC should be delivered by 5 pm today  [name], RN 09/14/2016, 1:49 PM

## 2016-09-14 NOTE — Telephone Encounter (Signed)
Sched appt 10/13/16 at 8:45. Lm on hm#.

## 2016-09-14 NOTE — Telephone Encounter (Signed)
-----   Message from Mena Goes, RN sent at 09/14/2016 12:39 PM EDT ----- Regarding: 2 weeks with Dr. Bridgett Larsson   ----- Message ----- From: Natividad Brood Sent: 09/14/2016  12:28 PM To: Vvs Charge Pool  S/p  evacuation of L distal thigh hematoma/seroma   F/u with Bridgett Larsson in 2 weeks.

## 2016-09-15 ENCOUNTER — Other Ambulatory Visit: Payer: Self-pay | Admitting: Pulmonary Disease

## 2016-09-15 DIAGNOSIS — D509 Iron deficiency anemia, unspecified: Secondary | ICD-10-CM | POA: Diagnosis not present

## 2016-09-15 DIAGNOSIS — D631 Anemia in chronic kidney disease: Secondary | ICD-10-CM | POA: Diagnosis not present

## 2016-09-15 DIAGNOSIS — N186 End stage renal disease: Secondary | ICD-10-CM | POA: Diagnosis not present

## 2016-09-15 DIAGNOSIS — N2581 Secondary hyperparathyroidism of renal origin: Secondary | ICD-10-CM | POA: Diagnosis not present

## 2016-09-15 DIAGNOSIS — E119 Type 2 diabetes mellitus without complications: Secondary | ICD-10-CM | POA: Diagnosis not present

## 2016-09-16 DIAGNOSIS — Z992 Dependence on renal dialysis: Secondary | ICD-10-CM | POA: Diagnosis not present

## 2016-09-16 DIAGNOSIS — I2510 Atherosclerotic heart disease of native coronary artery without angina pectoris: Secondary | ICD-10-CM | POA: Diagnosis not present

## 2016-09-16 DIAGNOSIS — N186 End stage renal disease: Secondary | ICD-10-CM | POA: Diagnosis not present

## 2016-09-16 DIAGNOSIS — I1311 Hypertensive heart and chronic kidney disease without heart failure, with stage 5 chronic kidney disease, or end stage renal disease: Secondary | ICD-10-CM | POA: Diagnosis not present

## 2016-09-16 DIAGNOSIS — T8189XD Other complications of procedures, not elsewhere classified, subsequent encounter: Secondary | ICD-10-CM | POA: Diagnosis not present

## 2016-09-16 DIAGNOSIS — I5032 Chronic diastolic (congestive) heart failure: Secondary | ICD-10-CM | POA: Diagnosis not present

## 2016-09-17 LAB — AEROBIC/ANAEROBIC CULTURE (SURGICAL/DEEP WOUND)

## 2016-09-17 LAB — AEROBIC/ANAEROBIC CULTURE W GRAM STAIN (SURGICAL/DEEP WOUND): Culture: NO GROWTH

## 2016-09-18 DIAGNOSIS — N186 End stage renal disease: Secondary | ICD-10-CM | POA: Diagnosis not present

## 2016-09-18 DIAGNOSIS — N2581 Secondary hyperparathyroidism of renal origin: Secondary | ICD-10-CM | POA: Diagnosis not present

## 2016-09-18 DIAGNOSIS — E119 Type 2 diabetes mellitus without complications: Secondary | ICD-10-CM | POA: Diagnosis not present

## 2016-09-18 DIAGNOSIS — D509 Iron deficiency anemia, unspecified: Secondary | ICD-10-CM | POA: Diagnosis not present

## 2016-09-18 DIAGNOSIS — D631 Anemia in chronic kidney disease: Secondary | ICD-10-CM | POA: Diagnosis not present

## 2016-09-19 DIAGNOSIS — N186 End stage renal disease: Secondary | ICD-10-CM | POA: Diagnosis not present

## 2016-09-19 DIAGNOSIS — Z992 Dependence on renal dialysis: Secondary | ICD-10-CM | POA: Diagnosis not present

## 2016-09-19 DIAGNOSIS — I1311 Hypertensive heart and chronic kidney disease without heart failure, with stage 5 chronic kidney disease, or end stage renal disease: Secondary | ICD-10-CM | POA: Diagnosis not present

## 2016-09-19 DIAGNOSIS — T8189XD Other complications of procedures, not elsewhere classified, subsequent encounter: Secondary | ICD-10-CM | POA: Diagnosis not present

## 2016-09-19 DIAGNOSIS — I2510 Atherosclerotic heart disease of native coronary artery without angina pectoris: Secondary | ICD-10-CM | POA: Diagnosis not present

## 2016-09-19 DIAGNOSIS — I5032 Chronic diastolic (congestive) heart failure: Secondary | ICD-10-CM | POA: Diagnosis not present

## 2016-09-20 DIAGNOSIS — D509 Iron deficiency anemia, unspecified: Secondary | ICD-10-CM | POA: Diagnosis not present

## 2016-09-20 DIAGNOSIS — E119 Type 2 diabetes mellitus without complications: Secondary | ICD-10-CM | POA: Diagnosis not present

## 2016-09-20 DIAGNOSIS — N2581 Secondary hyperparathyroidism of renal origin: Secondary | ICD-10-CM | POA: Diagnosis not present

## 2016-09-20 DIAGNOSIS — N186 End stage renal disease: Secondary | ICD-10-CM | POA: Diagnosis not present

## 2016-09-20 DIAGNOSIS — D631 Anemia in chronic kidney disease: Secondary | ICD-10-CM | POA: Diagnosis not present

## 2016-09-20 NOTE — Discharge Summary (Signed)
[facility] Discharge Summary   Patient ID:  [name] MRN: [id number] DOB/AGE: [birth year] 71 y.o.  Admit date: 09/12/2016 Discharge date: 09/13/2016 Date of Surgery: 09/12/2016 Surgeon: Surgeon(s): Conrad [place], MD  Admission Diagnosis: Seroma of left leg  T88.8XXD  Discharge Diagnoses:  Seroma of left leg  T88.8XXD  Secondary Diagnoses: Past Medical History:  Diagnosis Date  . Anemia    History  of Blood transfusion  . CAD (coronary artery disease)    stent to RCA  . Cancer ([place])    clear cell cancer, kidney  . Chronic renal insufficiency    On hemodialysis  . Complication of anesthesia 12/2010   pt is very confused, with AMS with anesthesia  . CVA 07/27/2008   CVA affected cognition and memory per family, no focal deficits.    . CVA (cerebral infarction) 2003   no apparent residual  . Diastolic congestive heart failure ([place])   . Dyslipidemia   . Encephalopathy   . ESRD 07/27/2008   ESRD due to HTN and NSAID's, started hemodialysis in 2005 in [place], [place]. Went to [facility] from 2010 to 2012 and since 2012 has been getting dialysis at [facility] on [address] in [place] on a MWF schedule. First access with RUA AVG placed in [place]. Next and current access was LUA AVG placed by Dr. Lucky Cowboy in [place] in or around 2012. Has had 2 or 3 procedures on that graft since placed per family. She gets her access work done here in [place] now. She is allergic to heparin and does not get any heparin at dialysis; she had an allergic reaction apparently when in ICU in the past.      . GERD (gastroesophageal reflux disease)   . Hyperlipidemia   . Hypertension   . Hypothyroidism   . Positive PPD    completed rifampin  . Pulmonary hypertension ([place])   . PVD (peripheral vascular disease) ([place])   . Seizures ([place])    last seizure 6 years ago  . Sleep apnea    Sleep Study 2008  . Traumatic seroma of left lower leg     Procedure(s): EVACUATION  HEMATOMA APPLICATION OF WOUND VAC  Discharged Condition: good  HPI: [name] is a 71 y.o. female who presents with chief complaint: pain in Left above-the-knee popliteal artery exposure incision.  Patient thinks the swelling in her distal thigh has gotten worse.  She denies any fever or chills.  The patient presents today for evacuation of left distal thigh seroma.[name]   Hospital Course:  [name] is a 71 y.o. female is S/P Left Procedure(s): EVACUATION HEMATOMA APPLICATION OF WOUND VAC Assessment/Planning: POD # 1 PROCEDURE: 1. Evacuation of left thigh hematoma 2. Placement of negative pressure dressing She will stay over night to watch for bleeding.    Plan if stable to discharge home tomorrow after HD as an inpatient.    Consults:  Treatment Team:  Mauricia Area, MD  Significant Diagnostic Studies: CBC Lab Results  Component Value Date   WBC 5.6 09/13/2016   HGB 9.9 (L) 09/13/2016   HCT 32.1 (L) 09/13/2016   MCV 83.2 09/13/2016   PLT 186 09/13/2016    BMET    Component Value Date/Time   NA 134 (L) 09/13/2016 1333   K 4.4 09/13/2016 1333   K 4.9 01/16/2011 1503   CL 92 (L) 09/13/2016 1333   CO2 31 09/13/2016 1333   GLUCOSE 118 (H) 09/13/2016 1333   BUN  33 (H) 09/13/2016 1333   CREATININE 8.68 (H) 09/13/2016 1333   CREATININE 4.16 (H) 06/04/2015 1654   CALCIUM 9.2 09/13/2016 1333   GFRNONAA 4 (L) 09/13/2016 1333   GFRNONAA 10 (L) 06/04/2015 1654   GFRAA 5 (L) 09/13/2016 1333   GFRAA 12 (L) 06/04/2015 1654   COAG Lab Results  Component Value Date   INR 1.04 07/21/2016   INR 0.97 07/06/2016   INR 1.05 01/29/2014     Disposition:  Discharge to :Home Discharge Instructions    Call MD for:  redness, tenderness, or signs of infection (pain, swelling, bleeding, redness, odor or green/yellow discharge around incision site)    Complete by:  As directed    Call MD for:  severe or increased pain, loss or decreased feeling  in affected limb(s)     Complete by:  As directed    Call MD for:  temperature >100.5    Complete by:  As directed    Discharge wound care:    Complete by:  As directed    Wound vac change on M/W/F by home health RN.   Driving Restrictions    Complete by:  As directed    No driving for 2 weeks & while taking pain medication   Lifting restrictions    Complete by:  As directed    No lifting for 2 weeks   Resume previous diet    Complete by:  As directed      Allergies as of 09/14/2016      Reactions   Ace Inhibitors Anaphylaxis, Rash   Heparin Other (See Comments)   MDs told her not to take after reaction in ICU   Iohexol Swelling, Other (See Comments)   1970s; passed out and had facial/tongue swelling.  Requires 13-hour prep with prednisone and benadryl   Phenytoin Other (See Comments)   Had reaction while in ICU; doesn't know.  MDs told her not to take ever again.   Wellbutrin [bupropion] Other (See Comments)   seizures   Meperidine Hcl Swelling, Rash   Makes tongue swell   Morphine Rash   Penicillins Hives, Rash   Has patient had a PCN reaction causing immediate rash, facial/tongue/throat swelling, SOB or lightheadedness with hypotension: Yes Has patient had a PCN reaction causing severe rash involving mucus membranes or skin necrosis: No Has patient had a PCN reaction that required hospitalization: No Has patient had a PCN reaction occurring within the last 10 years: No If all of the above answers are "NO", then may proceed with Cephalosporin use.   Valproic Acid And Related Other (See Comments)   Confusion   Iodinated Diagnostic Agents Other (See Comments)   Pre-meditate with benadryl and prednisone 3 times before appt.   Pentazocine Lactate Other (See Comments)   Patient does not remember reaction to this med ([place]).      Medication List    STOP taking these medications   oxyCODONE-acetaminophen 5-325 MG tablet Commonly known as:  PERCOCET/ROXICET     TAKE these medications    acetaminophen 500 MG tablet Commonly known as:  TYLENOL Take 1,000 mg by mouth daily as needed for mild pain.   amLODipine 10 MG tablet Commonly known as:  NORVASC Take 10 mg by mouth at bedtime.   aspirin EC 81 MG tablet Take 81 mg by mouth daily.   AURYXIA 1 GM 210 MG(Fe) tablet Generic drug:  ferric citrate Take 420 mg by mouth 3 (three) times daily with meals. And 1 tablet with  snacks   carvedilol 25 MG tablet Commonly known as:  COREG Take 25 mg by mouth 2 (two) times daily with a meal.   cilostazol 100 MG tablet Commonly known as:  PLETAL Take 1 tablet (100 mg total) by mouth 2 (two) times daily before a meal.   diphenhydrAMINE 50 MG capsule Commonly known as:  BENADRYL Take 1 capsule (50 mg total) by mouth every Monday, Wednesday, and Friday. On dialysis days What changed:  how much to take  how to take this  when to take this  additional instructions   folic acid-vitamin b complex-vitamin c-selenium-zinc 3 MG Tabs tablet Take 1 tablet by mouth daily.   hydrALAZINE 25 MG tablet Commonly known as:  APRESOLINE Take 25 mg by mouth 3 (three) times daily.   isosorbide dinitrate 20 MG tablet Commonly known as:  ISORDIL Take 1 tablet (20 mg total) by mouth daily.   levETIRAcetam 500 MG tablet Commonly known as:  KEPPRA Take 500 mg by mouth daily at 8 pm.   levothyroxine 150 MCG tablet Commonly known as:  SYNTHROID, LEVOTHROID Take 150 mcg by mouth daily before breakfast.   nitroGLYCERIN 0.4 MG SL tablet Commonly known as:  NITROSTAT PLACE 1 TABLET (0.4 MG TOTAL) UNDER THE TONGUE EVERY 5 (FIVE) MINUTES AS NEEDED FOR CHEST PAIN. What changed:  additional instructions   omeprazole 20 MG tablet Commonly known as:  PRILOSEC OTC Take 20 mg by mouth daily.   oxyCODONE 5 MG immediate release tablet Commonly known as:  ROXICODONE Take 1 tablet (5 mg total) by mouth every 6 (six) hours as needed for severe pain.   pravastatin 40 MG tablet Commonly known  as:  PRAVACHOL Take 1 tablet (40 mg total) by mouth at bedtime.   SENSIPAR 90 MG tablet Generic drug:  cinacalcet Take 90 mg by mouth every Monday, Wednesday, and Friday with hemodialysis.   traMADol 50 MG tablet Commonly known as:  ULTRAM Take 1 tablet (50 mg total) by mouth every 12 (twelve) hours as needed for moderate pain.   traZODone 50 MG tablet Commonly known as:  DESYREL Take 50 mg by mouth at bedtime as needed for sleep.            Discharge Care Instructions        Start     Ordered   09/15/16 0000  diphenhydrAMINE (BENADRYL) 50 MG capsule  Every M-W-F     09/14/16 1228   09/14/16 0000  Resume previous diet     09/14/16 1228   09/14/16 0000  Driving Restrictions    Comments:  No driving for 2 weeks & while taking pain medication   09/14/16 1228   09/14/16 0000  Lifting restrictions    Comments:  No lifting for 2 weeks   09/14/16 1228   09/14/16 0000  Call MD for:  temperature >100.5     09/14/16 1228   09/14/16 0000  Call MD for:  redness, tenderness, or signs of infection (pain, swelling, bleeding, redness, odor or green/yellow discharge around incision site)     09/14/16 1228   09/14/16 0000  Call MD for:  severe or increased pain, loss or decreased feeling  in affected limb(s)     09/14/16 1228   09/14/16 0000  Discharge wound care:    Comments:  Wound vac change on M/W/F by home health RN.   09/14/16 1228   09/14/16 0000  oxyCODONE (ROXICODONE) 5 MG immediate release tablet  Every 6 hours PRN  Question:  Supervising Provider  Answer:  Conrad [place]   09/14/16 1228     Verbal and written Discharge instructions given to the patient. Wound care per Discharge AVS Follow-up Information    [facility] Follow up.   Specialty:  [place] Why:  [facility] arranged- for home wound VAC needs Contact information: [place] [address] [phone]        [facility] Follow up.   Why:  home wound VAC arranged       Conrad [place], MD In 2 weeks.   Specialties:  Vascular Surgery, Cardiology Why:  Office will call you to arrange your appt (sent) Contact information: [place] [address] [phone]           Signed: Laurence Slate [facility] 09/20/2016, 9:53 AM   Addendum  This patient had evacuation of of left distal thigh hematoma and VAC placement.  She was kept an 2 days in the hospital to check for any bleeding after the VAC change.  No active bleeding was noted.  The patient was sent home with home [facility] care.  She will see me in the office in 2 weeks.  Adele Barthel, MD, FACS [facility] [place] Office: [phone] Pager: [phone]  09/20/2016, 10:10 AM

## 2016-09-22 DIAGNOSIS — I2510 Atherosclerotic heart disease of native coronary artery without angina pectoris: Secondary | ICD-10-CM | POA: Diagnosis not present

## 2016-09-22 DIAGNOSIS — T8189XD Other complications of procedures, not elsewhere classified, subsequent encounter: Secondary | ICD-10-CM | POA: Diagnosis not present

## 2016-09-22 DIAGNOSIS — N2581 Secondary hyperparathyroidism of renal origin: Secondary | ICD-10-CM | POA: Diagnosis not present

## 2016-09-22 DIAGNOSIS — D631 Anemia in chronic kidney disease: Secondary | ICD-10-CM | POA: Diagnosis not present

## 2016-09-22 DIAGNOSIS — N186 End stage renal disease: Secondary | ICD-10-CM | POA: Diagnosis not present

## 2016-09-22 DIAGNOSIS — I1311 Hypertensive heart and chronic kidney disease without heart failure, with stage 5 chronic kidney disease, or end stage renal disease: Secondary | ICD-10-CM | POA: Diagnosis not present

## 2016-09-22 DIAGNOSIS — D509 Iron deficiency anemia, unspecified: Secondary | ICD-10-CM | POA: Diagnosis not present

## 2016-09-22 DIAGNOSIS — Z992 Dependence on renal dialysis: Secondary | ICD-10-CM | POA: Diagnosis not present

## 2016-09-22 DIAGNOSIS — E119 Type 2 diabetes mellitus without complications: Secondary | ICD-10-CM | POA: Diagnosis not present

## 2016-09-22 DIAGNOSIS — I5032 Chronic diastolic (congestive) heart failure: Secondary | ICD-10-CM | POA: Diagnosis not present

## 2016-09-25 DIAGNOSIS — T8189XD Other complications of procedures, not elsewhere classified, subsequent encounter: Secondary | ICD-10-CM | POA: Diagnosis not present

## 2016-09-25 DIAGNOSIS — D509 Iron deficiency anemia, unspecified: Secondary | ICD-10-CM | POA: Diagnosis not present

## 2016-09-25 DIAGNOSIS — N186 End stage renal disease: Secondary | ICD-10-CM | POA: Diagnosis not present

## 2016-09-25 DIAGNOSIS — I5032 Chronic diastolic (congestive) heart failure: Secondary | ICD-10-CM | POA: Diagnosis not present

## 2016-09-25 DIAGNOSIS — Z992 Dependence on renal dialysis: Secondary | ICD-10-CM | POA: Diagnosis not present

## 2016-09-25 DIAGNOSIS — D631 Anemia in chronic kidney disease: Secondary | ICD-10-CM | POA: Diagnosis not present

## 2016-09-25 DIAGNOSIS — I1311 Hypertensive heart and chronic kidney disease without heart failure, with stage 5 chronic kidney disease, or end stage renal disease: Secondary | ICD-10-CM | POA: Diagnosis not present

## 2016-09-25 DIAGNOSIS — E119 Type 2 diabetes mellitus without complications: Secondary | ICD-10-CM | POA: Diagnosis not present

## 2016-09-25 DIAGNOSIS — I2510 Atherosclerotic heart disease of native coronary artery without angina pectoris: Secondary | ICD-10-CM | POA: Diagnosis not present

## 2016-09-25 DIAGNOSIS — N2581 Secondary hyperparathyroidism of renal origin: Secondary | ICD-10-CM | POA: Diagnosis not present

## 2016-09-27 DIAGNOSIS — I5032 Chronic diastolic (congestive) heart failure: Secondary | ICD-10-CM | POA: Diagnosis not present

## 2016-09-27 DIAGNOSIS — Z992 Dependence on renal dialysis: Secondary | ICD-10-CM | POA: Diagnosis not present

## 2016-09-27 DIAGNOSIS — D631 Anemia in chronic kidney disease: Secondary | ICD-10-CM | POA: Diagnosis not present

## 2016-09-27 DIAGNOSIS — N186 End stage renal disease: Secondary | ICD-10-CM | POA: Diagnosis not present

## 2016-09-27 DIAGNOSIS — N2581 Secondary hyperparathyroidism of renal origin: Secondary | ICD-10-CM | POA: Diagnosis not present

## 2016-09-27 DIAGNOSIS — I1311 Hypertensive heart and chronic kidney disease without heart failure, with stage 5 chronic kidney disease, or end stage renal disease: Secondary | ICD-10-CM | POA: Diagnosis not present

## 2016-09-27 DIAGNOSIS — E119 Type 2 diabetes mellitus without complications: Secondary | ICD-10-CM | POA: Diagnosis not present

## 2016-09-27 DIAGNOSIS — D509 Iron deficiency anemia, unspecified: Secondary | ICD-10-CM | POA: Diagnosis not present

## 2016-09-27 DIAGNOSIS — I2510 Atherosclerotic heart disease of native coronary artery without angina pectoris: Secondary | ICD-10-CM | POA: Diagnosis not present

## 2016-09-27 DIAGNOSIS — T8189XD Other complications of procedures, not elsewhere classified, subsequent encounter: Secondary | ICD-10-CM | POA: Diagnosis not present

## 2016-09-29 DIAGNOSIS — D631 Anemia in chronic kidney disease: Secondary | ICD-10-CM | POA: Diagnosis not present

## 2016-09-29 DIAGNOSIS — I1311 Hypertensive heart and chronic kidney disease without heart failure, with stage 5 chronic kidney disease, or end stage renal disease: Secondary | ICD-10-CM | POA: Diagnosis not present

## 2016-09-29 DIAGNOSIS — I5032 Chronic diastolic (congestive) heart failure: Secondary | ICD-10-CM | POA: Diagnosis not present

## 2016-09-29 DIAGNOSIS — N2581 Secondary hyperparathyroidism of renal origin: Secondary | ICD-10-CM | POA: Diagnosis not present

## 2016-09-29 DIAGNOSIS — E119 Type 2 diabetes mellitus without complications: Secondary | ICD-10-CM | POA: Diagnosis not present

## 2016-09-29 DIAGNOSIS — D509 Iron deficiency anemia, unspecified: Secondary | ICD-10-CM | POA: Diagnosis not present

## 2016-09-29 DIAGNOSIS — T8189XD Other complications of procedures, not elsewhere classified, subsequent encounter: Secondary | ICD-10-CM | POA: Diagnosis not present

## 2016-09-29 DIAGNOSIS — Z992 Dependence on renal dialysis: Secondary | ICD-10-CM | POA: Diagnosis not present

## 2016-09-29 DIAGNOSIS — N186 End stage renal disease: Secondary | ICD-10-CM | POA: Diagnosis not present

## 2016-09-29 DIAGNOSIS — I2510 Atherosclerotic heart disease of native coronary artery without angina pectoris: Secondary | ICD-10-CM | POA: Diagnosis not present

## 2016-10-01 DIAGNOSIS — E1129 Type 2 diabetes mellitus with other diabetic kidney complication: Secondary | ICD-10-CM | POA: Diagnosis not present

## 2016-10-01 DIAGNOSIS — N186 End stage renal disease: Secondary | ICD-10-CM | POA: Diagnosis not present

## 2016-10-01 DIAGNOSIS — Z992 Dependence on renal dialysis: Secondary | ICD-10-CM | POA: Diagnosis not present

## 2016-10-02 DIAGNOSIS — D631 Anemia in chronic kidney disease: Secondary | ICD-10-CM | POA: Diagnosis not present

## 2016-10-02 DIAGNOSIS — E119 Type 2 diabetes mellitus without complications: Secondary | ICD-10-CM | POA: Diagnosis not present

## 2016-10-02 DIAGNOSIS — Z992 Dependence on renal dialysis: Secondary | ICD-10-CM | POA: Diagnosis not present

## 2016-10-02 DIAGNOSIS — N2581 Secondary hyperparathyroidism of renal origin: Secondary | ICD-10-CM | POA: Diagnosis not present

## 2016-10-02 DIAGNOSIS — D509 Iron deficiency anemia, unspecified: Secondary | ICD-10-CM | POA: Diagnosis not present

## 2016-10-02 DIAGNOSIS — I1311 Hypertensive heart and chronic kidney disease without heart failure, with stage 5 chronic kidney disease, or end stage renal disease: Secondary | ICD-10-CM | POA: Diagnosis not present

## 2016-10-02 DIAGNOSIS — I2510 Atherosclerotic heart disease of native coronary artery without angina pectoris: Secondary | ICD-10-CM | POA: Diagnosis not present

## 2016-10-02 DIAGNOSIS — T8189XD Other complications of procedures, not elsewhere classified, subsequent encounter: Secondary | ICD-10-CM | POA: Diagnosis not present

## 2016-10-02 DIAGNOSIS — N186 End stage renal disease: Secondary | ICD-10-CM | POA: Diagnosis not present

## 2016-10-02 DIAGNOSIS — I5032 Chronic diastolic (congestive) heart failure: Secondary | ICD-10-CM | POA: Diagnosis not present

## 2016-10-04 DIAGNOSIS — D509 Iron deficiency anemia, unspecified: Secondary | ICD-10-CM | POA: Diagnosis not present

## 2016-10-04 DIAGNOSIS — T8189XD Other complications of procedures, not elsewhere classified, subsequent encounter: Secondary | ICD-10-CM | POA: Diagnosis not present

## 2016-10-04 DIAGNOSIS — N186 End stage renal disease: Secondary | ICD-10-CM | POA: Diagnosis not present

## 2016-10-04 DIAGNOSIS — I2510 Atherosclerotic heart disease of native coronary artery without angina pectoris: Secondary | ICD-10-CM | POA: Diagnosis not present

## 2016-10-04 DIAGNOSIS — I5032 Chronic diastolic (congestive) heart failure: Secondary | ICD-10-CM | POA: Diagnosis not present

## 2016-10-04 DIAGNOSIS — I1311 Hypertensive heart and chronic kidney disease without heart failure, with stage 5 chronic kidney disease, or end stage renal disease: Secondary | ICD-10-CM | POA: Diagnosis not present

## 2016-10-04 DIAGNOSIS — E119 Type 2 diabetes mellitus without complications: Secondary | ICD-10-CM | POA: Diagnosis not present

## 2016-10-04 DIAGNOSIS — D631 Anemia in chronic kidney disease: Secondary | ICD-10-CM | POA: Diagnosis not present

## 2016-10-04 DIAGNOSIS — N2581 Secondary hyperparathyroidism of renal origin: Secondary | ICD-10-CM | POA: Diagnosis not present

## 2016-10-04 DIAGNOSIS — Z992 Dependence on renal dialysis: Secondary | ICD-10-CM | POA: Diagnosis not present

## 2016-10-06 DIAGNOSIS — T8189XD Other complications of procedures, not elsewhere classified, subsequent encounter: Secondary | ICD-10-CM | POA: Diagnosis not present

## 2016-10-06 DIAGNOSIS — D631 Anemia in chronic kidney disease: Secondary | ICD-10-CM | POA: Diagnosis not present

## 2016-10-06 DIAGNOSIS — N2581 Secondary hyperparathyroidism of renal origin: Secondary | ICD-10-CM | POA: Diagnosis not present

## 2016-10-06 DIAGNOSIS — I5032 Chronic diastolic (congestive) heart failure: Secondary | ICD-10-CM | POA: Diagnosis not present

## 2016-10-06 DIAGNOSIS — N186 End stage renal disease: Secondary | ICD-10-CM | POA: Diagnosis not present

## 2016-10-06 DIAGNOSIS — I2510 Atherosclerotic heart disease of native coronary artery without angina pectoris: Secondary | ICD-10-CM | POA: Diagnosis not present

## 2016-10-06 DIAGNOSIS — D509 Iron deficiency anemia, unspecified: Secondary | ICD-10-CM | POA: Diagnosis not present

## 2016-10-06 DIAGNOSIS — Z992 Dependence on renal dialysis: Secondary | ICD-10-CM | POA: Diagnosis not present

## 2016-10-06 DIAGNOSIS — I1311 Hypertensive heart and chronic kidney disease without heart failure, with stage 5 chronic kidney disease, or end stage renal disease: Secondary | ICD-10-CM | POA: Diagnosis not present

## 2016-10-06 DIAGNOSIS — E119 Type 2 diabetes mellitus without complications: Secondary | ICD-10-CM | POA: Diagnosis not present

## 2016-10-09 DIAGNOSIS — Z992 Dependence on renal dialysis: Secondary | ICD-10-CM | POA: Diagnosis not present

## 2016-10-09 DIAGNOSIS — D509 Iron deficiency anemia, unspecified: Secondary | ICD-10-CM | POA: Diagnosis not present

## 2016-10-09 DIAGNOSIS — I2510 Atherosclerotic heart disease of native coronary artery without angina pectoris: Secondary | ICD-10-CM | POA: Diagnosis not present

## 2016-10-09 DIAGNOSIS — N2581 Secondary hyperparathyroidism of renal origin: Secondary | ICD-10-CM | POA: Diagnosis not present

## 2016-10-09 DIAGNOSIS — I1311 Hypertensive heart and chronic kidney disease without heart failure, with stage 5 chronic kidney disease, or end stage renal disease: Secondary | ICD-10-CM | POA: Diagnosis not present

## 2016-10-09 DIAGNOSIS — E119 Type 2 diabetes mellitus without complications: Secondary | ICD-10-CM | POA: Diagnosis not present

## 2016-10-09 DIAGNOSIS — I5032 Chronic diastolic (congestive) heart failure: Secondary | ICD-10-CM | POA: Diagnosis not present

## 2016-10-09 DIAGNOSIS — T8189XD Other complications of procedures, not elsewhere classified, subsequent encounter: Secondary | ICD-10-CM | POA: Diagnosis not present

## 2016-10-09 DIAGNOSIS — D631 Anemia in chronic kidney disease: Secondary | ICD-10-CM | POA: Diagnosis not present

## 2016-10-09 DIAGNOSIS — N186 End stage renal disease: Secondary | ICD-10-CM | POA: Diagnosis not present

## 2016-10-11 ENCOUNTER — Telehealth: Payer: Self-pay | Admitting: Vascular Surgery

## 2016-10-11 ENCOUNTER — Other Ambulatory Visit: Payer: Self-pay | Admitting: Pulmonary Disease

## 2016-10-11 DIAGNOSIS — D509 Iron deficiency anemia, unspecified: Secondary | ICD-10-CM | POA: Diagnosis not present

## 2016-10-11 DIAGNOSIS — T8189XD Other complications of procedures, not elsewhere classified, subsequent encounter: Secondary | ICD-10-CM | POA: Diagnosis not present

## 2016-10-11 DIAGNOSIS — N186 End stage renal disease: Secondary | ICD-10-CM | POA: Diagnosis not present

## 2016-10-11 DIAGNOSIS — I5032 Chronic diastolic (congestive) heart failure: Secondary | ICD-10-CM | POA: Diagnosis not present

## 2016-10-11 DIAGNOSIS — I1311 Hypertensive heart and chronic kidney disease without heart failure, with stage 5 chronic kidney disease, or end stage renal disease: Secondary | ICD-10-CM | POA: Diagnosis not present

## 2016-10-11 DIAGNOSIS — N2581 Secondary hyperparathyroidism of renal origin: Secondary | ICD-10-CM | POA: Diagnosis not present

## 2016-10-11 DIAGNOSIS — Z992 Dependence on renal dialysis: Secondary | ICD-10-CM | POA: Diagnosis not present

## 2016-10-11 DIAGNOSIS — I2510 Atherosclerotic heart disease of native coronary artery without angina pectoris: Secondary | ICD-10-CM | POA: Diagnosis not present

## 2016-10-11 DIAGNOSIS — E119 Type 2 diabetes mellitus without complications: Secondary | ICD-10-CM | POA: Diagnosis not present

## 2016-10-11 DIAGNOSIS — D631 Anemia in chronic kidney disease: Secondary | ICD-10-CM | POA: Diagnosis not present

## 2016-10-11 NOTE — Telephone Encounter (Signed)
Olivia Mackie with [facility] called [phone]).  Says patient refused wound vac today.  The wound is healing well so she used a wet to dry dressing.  Pt has an office appointment to see Dr. Bridgett Larsson on 10/13/16.  Thurston Hole., LPN

## 2016-10-12 DIAGNOSIS — Z992 Dependence on renal dialysis: Secondary | ICD-10-CM | POA: Diagnosis not present

## 2016-10-12 DIAGNOSIS — E782 Mixed hyperlipidemia: Secondary | ICD-10-CM | POA: Diagnosis not present

## 2016-10-12 DIAGNOSIS — E039 Hypothyroidism, unspecified: Secondary | ICD-10-CM | POA: Diagnosis not present

## 2016-10-12 DIAGNOSIS — I739 Peripheral vascular disease, unspecified: Secondary | ICD-10-CM | POA: Diagnosis not present

## 2016-10-12 DIAGNOSIS — N186 End stage renal disease: Secondary | ICD-10-CM | POA: Diagnosis not present

## 2016-10-12 DIAGNOSIS — I5032 Chronic diastolic (congestive) heart failure: Secondary | ICD-10-CM | POA: Diagnosis not present

## 2016-10-12 DIAGNOSIS — I132 Hypertensive heart and chronic kidney disease with heart failure and with stage 5 chronic kidney disease, or end stage renal disease: Secondary | ICD-10-CM | POA: Diagnosis not present

## 2016-10-12 NOTE — Progress Notes (Signed)
    Postoperative Visit   History of Present Illness   [name] is a 71 y.o. 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  The patient went back to OR on 09/12/16 for evacuation of a seroma in the left above the knee popliteal artery exposure and placement of VAC dressing (09/12/16).  L thigh wound has nearly healed.  Pt is getting wet-to-dry dressings at this point   For VQI Use Only   PRE-ADM LIVING: Home  AMB STATUS: Ambulatory with Assistance   Physical Examination   Vitals:   10/13/16 0838  BP: (!) 199/83  Pulse: 78  Resp: 18  Temp: 97.9 F (36.6 C)  TempSrc: Oral  SpO2: 93%  Weight: 177 lb (80.3 kg)  Height: 5' 2.5" (1.588 m)     LLE: L contracted wound at AK pop exposure incision, minimal residual skin defect, no drainage, no erythema  Medical Decision Making   [name] is a 71 y.o. year old female who presents s/p failed L fem-pop BPG due to calciphylaxis.    Cont wet-to-dry dressings daily.  Suspect will be fully healed in next 1-2 weeks.  The patient doesn't want any further interventions on her leg.  She is going to try to manage everything medical from now on.  Continue with q3 month ABI  Thank you for allowing [place] to participate in this patient's care.   Adele Barthel, MD, FACS [facility] [place] Office: [phone] Pager: [phone]

## 2016-10-13 ENCOUNTER — Encounter: Payer: Self-pay | Admitting: Vascular Surgery

## 2016-10-13 ENCOUNTER — Ambulatory Visit (INDEPENDENT_AMBULATORY_CARE_PROVIDER_SITE_OTHER): Payer: Self-pay | Admitting: Vascular Surgery

## 2016-10-13 VITALS — BP 199/83 | HR 78 | Temp 97.9°F | Resp 18 | Ht 62.5 in | Wt 177.0 lb

## 2016-10-13 DIAGNOSIS — N186 End stage renal disease: Secondary | ICD-10-CM | POA: Diagnosis not present

## 2016-10-13 DIAGNOSIS — N2581 Secondary hyperparathyroidism of renal origin: Secondary | ICD-10-CM | POA: Diagnosis not present

## 2016-10-13 DIAGNOSIS — D631 Anemia in chronic kidney disease: Secondary | ICD-10-CM | POA: Diagnosis not present

## 2016-10-13 DIAGNOSIS — I70213 Atherosclerosis of native arteries of extremities with intermittent claudication, bilateral legs: Secondary | ICD-10-CM

## 2016-10-13 DIAGNOSIS — D509 Iron deficiency anemia, unspecified: Secondary | ICD-10-CM | POA: Diagnosis not present

## 2016-10-13 DIAGNOSIS — E119 Type 2 diabetes mellitus without complications: Secondary | ICD-10-CM | POA: Diagnosis not present

## 2016-10-16 DIAGNOSIS — Z992 Dependence on renal dialysis: Secondary | ICD-10-CM | POA: Diagnosis not present

## 2016-10-16 DIAGNOSIS — I1311 Hypertensive heart and chronic kidney disease without heart failure, with stage 5 chronic kidney disease, or end stage renal disease: Secondary | ICD-10-CM | POA: Diagnosis not present

## 2016-10-16 DIAGNOSIS — T8189XD Other complications of procedures, not elsewhere classified, subsequent encounter: Secondary | ICD-10-CM | POA: Diagnosis not present

## 2016-10-16 DIAGNOSIS — E119 Type 2 diabetes mellitus without complications: Secondary | ICD-10-CM | POA: Diagnosis not present

## 2016-10-16 DIAGNOSIS — D509 Iron deficiency anemia, unspecified: Secondary | ICD-10-CM | POA: Diagnosis not present

## 2016-10-16 DIAGNOSIS — N2581 Secondary hyperparathyroidism of renal origin: Secondary | ICD-10-CM | POA: Diagnosis not present

## 2016-10-16 DIAGNOSIS — I5032 Chronic diastolic (congestive) heart failure: Secondary | ICD-10-CM | POA: Diagnosis not present

## 2016-10-16 DIAGNOSIS — I2510 Atherosclerotic heart disease of native coronary artery without angina pectoris: Secondary | ICD-10-CM | POA: Diagnosis not present

## 2016-10-16 DIAGNOSIS — R079 Chest pain, unspecified: Secondary | ICD-10-CM | POA: Diagnosis not present

## 2016-10-16 DIAGNOSIS — D631 Anemia in chronic kidney disease: Secondary | ICD-10-CM | POA: Diagnosis not present

## 2016-10-16 DIAGNOSIS — N186 End stage renal disease: Secondary | ICD-10-CM | POA: Diagnosis not present

## 2016-10-17 NOTE — Addendum Note (Signed)
Addended by: Lianne Cure A on: 10/17/2016 09:37 AM   Modules accepted: Orders

## 2016-10-18 DIAGNOSIS — E119 Type 2 diabetes mellitus without complications: Secondary | ICD-10-CM | POA: Diagnosis not present

## 2016-10-18 DIAGNOSIS — D509 Iron deficiency anemia, unspecified: Secondary | ICD-10-CM | POA: Diagnosis not present

## 2016-10-18 DIAGNOSIS — N186 End stage renal disease: Secondary | ICD-10-CM | POA: Diagnosis not present

## 2016-10-18 DIAGNOSIS — D631 Anemia in chronic kidney disease: Secondary | ICD-10-CM | POA: Diagnosis not present

## 2016-10-18 DIAGNOSIS — N2581 Secondary hyperparathyroidism of renal origin: Secondary | ICD-10-CM | POA: Diagnosis not present

## 2016-10-20 DIAGNOSIS — Z992 Dependence on renal dialysis: Secondary | ICD-10-CM | POA: Diagnosis not present

## 2016-10-20 DIAGNOSIS — I2510 Atherosclerotic heart disease of native coronary artery without angina pectoris: Secondary | ICD-10-CM | POA: Diagnosis not present

## 2016-10-20 DIAGNOSIS — T8189XD Other complications of procedures, not elsewhere classified, subsequent encounter: Secondary | ICD-10-CM | POA: Diagnosis not present

## 2016-10-20 DIAGNOSIS — D509 Iron deficiency anemia, unspecified: Secondary | ICD-10-CM | POA: Diagnosis not present

## 2016-10-20 DIAGNOSIS — N186 End stage renal disease: Secondary | ICD-10-CM | POA: Diagnosis not present

## 2016-10-20 DIAGNOSIS — D631 Anemia in chronic kidney disease: Secondary | ICD-10-CM | POA: Diagnosis not present

## 2016-10-20 DIAGNOSIS — I1311 Hypertensive heart and chronic kidney disease without heart failure, with stage 5 chronic kidney disease, or end stage renal disease: Secondary | ICD-10-CM | POA: Diagnosis not present

## 2016-10-20 DIAGNOSIS — I5032 Chronic diastolic (congestive) heart failure: Secondary | ICD-10-CM | POA: Diagnosis not present

## 2016-10-20 DIAGNOSIS — E119 Type 2 diabetes mellitus without complications: Secondary | ICD-10-CM | POA: Diagnosis not present

## 2016-10-20 DIAGNOSIS — N2581 Secondary hyperparathyroidism of renal origin: Secondary | ICD-10-CM | POA: Diagnosis not present

## 2016-10-23 DIAGNOSIS — T8189XD Other complications of procedures, not elsewhere classified, subsequent encounter: Secondary | ICD-10-CM | POA: Diagnosis not present

## 2016-10-23 DIAGNOSIS — M216X1 Other acquired deformities of right foot: Secondary | ICD-10-CM | POA: Diagnosis not present

## 2016-10-23 DIAGNOSIS — E119 Type 2 diabetes mellitus without complications: Secondary | ICD-10-CM | POA: Diagnosis not present

## 2016-10-23 DIAGNOSIS — I739 Peripheral vascular disease, unspecified: Secondary | ICD-10-CM | POA: Diagnosis not present

## 2016-10-23 DIAGNOSIS — M2041 Other hammer toe(s) (acquired), right foot: Secondary | ICD-10-CM | POA: Diagnosis not present

## 2016-10-23 DIAGNOSIS — N186 End stage renal disease: Secondary | ICD-10-CM | POA: Diagnosis not present

## 2016-10-23 DIAGNOSIS — Z992 Dependence on renal dialysis: Secondary | ICD-10-CM | POA: Diagnosis not present

## 2016-10-23 DIAGNOSIS — N2581 Secondary hyperparathyroidism of renal origin: Secondary | ICD-10-CM | POA: Diagnosis not present

## 2016-10-23 DIAGNOSIS — I1311 Hypertensive heart and chronic kidney disease without heart failure, with stage 5 chronic kidney disease, or end stage renal disease: Secondary | ICD-10-CM | POA: Diagnosis not present

## 2016-10-23 DIAGNOSIS — D631 Anemia in chronic kidney disease: Secondary | ICD-10-CM | POA: Diagnosis not present

## 2016-10-23 DIAGNOSIS — D509 Iron deficiency anemia, unspecified: Secondary | ICD-10-CM | POA: Diagnosis not present

## 2016-10-23 DIAGNOSIS — D2371 Other benign neoplasm of skin of right lower limb, including hip: Secondary | ICD-10-CM | POA: Diagnosis not present

## 2016-10-23 DIAGNOSIS — I2510 Atherosclerotic heart disease of native coronary artery without angina pectoris: Secondary | ICD-10-CM | POA: Diagnosis not present

## 2016-10-23 DIAGNOSIS — I5032 Chronic diastolic (congestive) heart failure: Secondary | ICD-10-CM | POA: Diagnosis not present

## 2016-10-25 DIAGNOSIS — D509 Iron deficiency anemia, unspecified: Secondary | ICD-10-CM | POA: Diagnosis not present

## 2016-10-25 DIAGNOSIS — N2581 Secondary hyperparathyroidism of renal origin: Secondary | ICD-10-CM | POA: Diagnosis not present

## 2016-10-25 DIAGNOSIS — D631 Anemia in chronic kidney disease: Secondary | ICD-10-CM | POA: Diagnosis not present

## 2016-10-25 DIAGNOSIS — N186 End stage renal disease: Secondary | ICD-10-CM | POA: Diagnosis not present

## 2016-10-25 DIAGNOSIS — E119 Type 2 diabetes mellitus without complications: Secondary | ICD-10-CM | POA: Diagnosis not present

## 2016-10-25 NOTE — Progress Notes (Signed)
Cardiology Office Note   Date:  10/26/2016   ID:  [name], DOB 08-01-1945, MRN [id number]  PCP:  Glendale Chard, MD  Cardiologist:   Minus Breeding, MD    Chief Complaint  Patient presents with  . Chest Pain      History of Present Illness: [name] is a 71 y.o. female who presents for follow up of nonobsructive coronary disease. She has end-stage renal disease and is on dialysis. She has had an extensive workup for dyspnea and this included an echocardiogram. This demonstrated pulmonary pressure to be 107 with an EF of 35-40%. She did have an evaluation in our office for consideration of an MRI because of a possible speckleding on echo. However, she could not have this done as she has some problems with claustrophobia. She had SPEP sent off. There was no protein spike on the SPEP.  She had a right heart cath with a normal wedge.  She was started on Letairis.  I saw her prior to popliteal bypass.  She had a perfusion study.  The study does demonstrate an inferior scar but was a low risk study with no other areas of ischemia.  She was approved for surgery.  However, she had a very complicated course with hematoma requiring surgical exploration and a wound vac.    She has had episodes of chest pain. This is similar to symptoms she had costochondritis years before. It's a sharp discomfort. His lower chest and radiates around in the lower chest upper epigastric lower rib cage area. It comes on sporadically several times per week. It is intense when it happens. If she takes 400 mg of Motrin and a sublingual nitroglycerin will go away. She cannot bring it on. She denies any substernal upper discomfort.She does not have associated nausea vomiting or diaphoresis. She has no shortness of breath, PND or orthopnea. She's not describing palpitations, presyncope or syncope.  Past Medical History:  Diagnosis Date  . Anemia    History  of Blood transfusion  . CAD (coronary artery  disease)    stent to RCA  . Cancer ([place])    clear cell cancer, kidney  . Chronic renal insufficiency    On hemodialysis  . Complication of anesthesia 12/2010   pt is very confused, with AMS with anesthesia  . CVA 07/27/2008   CVA affected cognition and memory per family, no focal deficits.    . CVA (cerebral infarction) 2003   no apparent residual  . Diastolic congestive heart failure ([place])   . Dyslipidemia   . Encephalopathy   . ESRD 07/27/2008   ESRD due to HTN and NSAID's, started hemodialysis in 2005 in [place], [place]. Went to [facility] from 2010 to 2012 and since 2012 has been getting dialysis at [facility] on [address] in [place] on a MWF schedule. First access with RUA AVG placed in [place]. Next and current access was LUA AVG placed by Dr. Lucky Cowboy in [place] in or around 2012. Has had 2 or 3 procedures on that graft since placed per family. She gets her access work done here in [place] now. She is allergic to heparin and does not get any heparin at dialysis; she had an allergic reaction apparently when in ICU in the past.      . GERD (gastroesophageal reflux disease)   . Hyperlipidemia   . Hypertension   . Hypothyroidism   . Positive PPD    completed rifampin  . Pulmonary hypertension ([place])   .  PVD (peripheral vascular disease) ([place])   . Seizures ([place])    last seizure 6 years ago  . Sleep apnea    Sleep Study 2008  . Traumatic seroma of left lower leg     Past Surgical History:  Procedure Laterality Date  . ABDOMINAL AORTOGRAM W/LOWER EXTREMITY Bilateral 06/15/2016   Procedure: Abdominal Aortogram w/Lower Extremity;  Surgeon: Conrad [place], MD;  Location: [facility] CV [facility];  Service: Cardiovascular;  Laterality: Bilateral;  . ABDOMINAL HYSTERECTOMY    . APPENDECTOMY    . APPLICATION OF WOUND VAC Left 09/12/2016   Procedure: APPLICATION OF WOUND VAC;  Surgeon: Conrad [place], MD;  Location: [facility];  Service: Vascular;  Laterality: Left;  . ARTERY EXPLORATION  Left 07/25/2016   Procedure: ARTERY EXPLORATION LEFT ABOVE KNEE POPLITEAL AND GROIN;  Surgeon: Conrad [place], MD;  Location: [facility];  Service: Vascular;  Laterality: Left;  . CARDIAC CATHETERIZATION     last 2016  . CHOLECYSTECTOMY    . D&Cs    . HEMATOMA EVACUATION Left 09/12/2016   Procedure: EVACUATION HEMATOMA;  Surgeon: Conrad E[place], MD;  Location: [facility];  Service: Vascular;  Laterality: Left;  . LEFT HEART CATHETERIZATION WITH CORONARY ANGIOGRAM N/A 02/22/2011   Procedure: LEFT HEART CATHETERIZATION WITH CORONARY ANGIOGRAM;  Surgeon: Wellington Hampshire, MD;  Location: [facility] CATH LAB;  Service: Cardiovascular;  Laterality: N/A;  . left nephrectomy    . NEPHRECTOMY Left    Malignant tumor  . PERIPHERAL VASCULAR CATHETERIZATION N/A 12/31/2014   Procedure: Abdominal Aortogram;  Surgeon: Conrad , MD;  Location: [facility] CV [facility];  Service: Cardiovascular;  Laterality: N/A;  . right ankle repair    . RIGHT HEART CATHETERIZATION N/A 01/29/2014   Procedure: RIGHT HEART CATH;  Surgeon: Larey Dresser, MD;  Location: [facility] CATH LAB;  Service: Cardiovascular;  Laterality: N/A;  . TONSILLECTOMY    . VASCULAR SURGERY  11/2010   graft inserted to left arm     Current Outpatient Prescriptions  Medication Sig Dispense Refill  . acetaminophen (TYLENOL) 500 MG tablet Take 1,000 mg by mouth daily as needed for mild pain.     [name] amLODipine (NORVASC) 10 MG tablet Take 10 mg by mouth at bedtime.     [name] aspirin EC 81 MG tablet Take 81 mg by mouth daily.    . carvedilol (COREG) 25 MG tablet Take 25 mg by mouth 2 (two) times daily with a meal.    . cilostazol (PLETAL) 100 MG tablet Take 1 tablet (100 mg total) by mouth 2 (two) times daily before a meal. 60 tablet 11  . diphenhydrAMINE (BENADRYL) 50 MG capsule Take 1 capsule (50 mg total) by mouth every Monday, Wednesday, and Friday. On dialysis days    . ferric citrate (AURYXIA) 1 GM 210 MG(Fe) tablet Take 420 mg by mouth 3 (three) times daily with meals. And  1 tablet with snacks    . folic acid-vitamin b complex-vitamin c-selenium-zinc (DIALYVITE) 3 MG TABS tablet Take 1 tablet by mouth daily.    . hydrALAZINE (APRESOLINE) 25 MG tablet Take 25 mg by mouth 3 (three) times daily.    . isosorbide dinitrate (ISORDIL) 20 MG tablet Take 1 tablet (20 mg total) by mouth 3 (three) times daily. 270 tablet 3  . LETAIRIS 5 MG tablet TAKE 1 TABLET (5MG ) BY MOUTH DAILY. DO NOT HANDLE IF PREGNANT. DO NOTSPLIT, CRUSH, OR CHEW. AVOID INHALATION AND CONTACT WITH SKIN OR EYES. 30 tablet 11  .  levETIRAcetam (KEPPRA) 500 MG tablet Take 500 mg by mouth daily at 8 pm.    . levothyroxine (SYNTHROID, LEVOTHROID) 150 MCG tablet Take 150 mcg by mouth daily before breakfast.    . nitroGLYCERIN (NITROSTAT) 0.4 MG SL tablet Place 1 tablet (0.4 mg total) under the tongue every 5 (five) minutes as needed for chest pain. 25 tablet 1  . omeprazole (PRILOSEC OTC) 20 MG tablet Take 20 mg by mouth daily.    [name] oxyCODONE (ROXICODONE) 5 MG immediate release tablet Take 1 tablet (5 mg total) by mouth every 6 (six) hours as needed for severe pain. 30 tablet 0  . pravastatin (PRAVACHOL) 40 MG tablet Take 1 tablet (40 mg total) by mouth at bedtime. 90 tablet 3  . SENSIPAR 90 MG tablet Take 90 mg by mouth every Monday, Wednesday, and Friday with hemodialysis.     [name] traMADol (ULTRAM) 50 MG tablet Take 1 tablet (50 mg total) by mouth every 12 (twelve) hours as needed for moderate pain. 20 tablet 0  . traZODone (DESYREL) 50 MG tablet Take 50 mg by mouth at bedtime as needed for sleep.      No current facility-administered medications for this visit.     Allergies:   Ace inhibitors; Heparin; Iohexol; Phenytoin; Wellbutrin [bupropion]; Meperidine hcl; Morphine; Penicillins; Valproic acid and related; Iodinated diagnostic agents; and Pentazocine lactate    ROS:  Please see the history of present illness.   Otherwise, review of systems are positive for none.   All other systems are reviewed and  negative.    PHYSICAL EXAM: VS:  BP (!) 156/72   Pulse 69   Ht 5' 2.5" (1.588 m)   Wt 172 lb 12.8 oz (78.4 kg)   BMI 31.10 kg/m  , BMI Body mass index is 31.1 kg/m.  GENERAL:  Well appearing NECK:  No jugular venous distention, waveform within normal limits, carotid upstroke brisk and symmetric, no bruits, no thyromegaly LUNGS:  Clear to auscultation bilaterally CHEST:  Unremarkable HEART:  PMI not displaced or sustained,S1 and S2 within normal limits, no S3, no S4, no clicks, no rubs,  notumor 6 apical systolic murmur brief nonradiating, no diastolic murmurs ABD:  Flat, positive bowel sounds normal in frequency in pitch, no bruits, no rebound, no guarding, no midline pulsatile mass, no hepatomegaly, no splenomegaly EXT:  2 plus pulses upper and decreased DP/PT bilateral, no edema, no cyanosis no clubbing, left upper arm dialysis graft   EKG:  EKG is  ordered today. The ekg ordered today demonstrates sinus rhythm, rate 69, old inferior infarct, poor anterior R wave progression, QTC slightly prolonged, no acute ST-T wave changes.   Recent Labs: 07/21/2016: ALT 11 09/13/2016: BUN 33; Creatinine, Ser 8.68; Hemoglobin 9.9; Platelets 186; Potassium 4.4; Sodium 134    Lipid Panel    Component Value Date/Time   CHOL 159 02/22/2011 1815   TRIG 140 02/22/2011 1815   HDL 44 02/22/2011 1815   CHOLHDL 3.6 02/22/2011 1815   VLDL 28 02/22/2011 1815   LDLCALC 87 02/22/2011 1815      Wt Readings from Last 3 Encounters:  10/26/16 172 lb 12.8 oz (78.4 kg)  10/13/16 177 lb (80.3 kg)  09/14/16 171 lb 15.3 oz (78 kg)      Other studies Reviewed: Additional studies/ records that were reviewed today include: Vascular surgery records. Review of the above records demonstrates:  Please see elsewhere in the note.     ASSESSMENT AND PLAN:  CHRONIC SYSTOLIC/DIASTOLIC HF:  She  seems to be euvolemic.  No change in therapy is planned.    PULMONARY HTN:   She has had this followed and managed  by pulmonary.    CAD:  She had a low risk perfusion study.    TOBACCO ABUSE:  She started smoking again.  I encouraged her to stop again.   DYSLIPIDEMIA:   I will ask her to come back for repeat lipids.   PVD:  She will have this managed conservatively given the results of the recent procedure.  CHEST PAIN:  She had a low risk perfusion study as above. These are atypical symptoms. At this point may well represent costochondritis and for some reason it improves with nitrates. I'm going to increase her isosorbide to 3 times a day and she can still use sublingual. She'll use the Motrin as well. She'll let me know how she does with these symptoms.   Current medicines are reviewed at length with the patient today.  The patient does not have concerns regarding medicines.  The following changes have been made:  As above.   Labs/ tests ordered today include: None  Orders Placed This Encounter  Procedures  . Lipid panel  . EKG 12-Lead     Disposition:   FU with me 4 months.      Signed, Minus Breeding, MD  10/26/2016 3:33 PM    [facility]

## 2016-10-26 ENCOUNTER — Ambulatory Visit: Payer: Medicare Other | Admitting: Physician Assistant

## 2016-10-26 ENCOUNTER — Ambulatory Visit (INDEPENDENT_AMBULATORY_CARE_PROVIDER_SITE_OTHER): Payer: Medicare Other | Admitting: Cardiology

## 2016-10-26 ENCOUNTER — Encounter: Payer: Self-pay | Admitting: Cardiology

## 2016-10-26 VITALS — BP 156/72 | HR 69 | Ht 62.5 in | Wt 172.8 lb

## 2016-10-26 DIAGNOSIS — I10 Essential (primary) hypertension: Secondary | ICD-10-CM | POA: Diagnosis not present

## 2016-10-26 DIAGNOSIS — I5032 Chronic diastolic (congestive) heart failure: Secondary | ICD-10-CM | POA: Diagnosis not present

## 2016-10-26 DIAGNOSIS — E785 Hyperlipidemia, unspecified: Secondary | ICD-10-CM

## 2016-10-26 DIAGNOSIS — I779 Disorder of arteries and arterioles, unspecified: Secondary | ICD-10-CM

## 2016-10-26 DIAGNOSIS — R072 Precordial pain: Secondary | ICD-10-CM | POA: Diagnosis not present

## 2016-10-26 MED ORDER — NITROGLYCERIN 0.4 MG SL SUBL: 0.4000 mg | SUBLINGUAL_TABLET | SUBLINGUAL | 1 refills | Status: DC | PRN

## 2016-10-26 MED ORDER — ISOSORBIDE DINITRATE 20 MG PO TABS: 20.0000 mg | ORAL_TABLET | Freq: Three times a day (TID) | ORAL | 3 refills | Status: DC

## 2016-10-26 NOTE — Patient Instructions (Addendum)
Medication Instructions:  INCREASE- Isosorbide 20 mg three times a day  If you need a refill on your cardiac medications before your next appointment, please call your pharmacy.  Labwork: Fasting Lipids HERE IN OUR OFFICE AT [facility]  Testing/Procedures: None Ordered  Follow-Up: Your physician wants you to follow-up in: 4 Months.   Thank you for choosing [facility] at [facility]!!

## 2016-10-27 DIAGNOSIS — N2581 Secondary hyperparathyroidism of renal origin: Secondary | ICD-10-CM | POA: Diagnosis not present

## 2016-10-27 DIAGNOSIS — E119 Type 2 diabetes mellitus without complications: Secondary | ICD-10-CM | POA: Diagnosis not present

## 2016-10-27 DIAGNOSIS — D631 Anemia in chronic kidney disease: Secondary | ICD-10-CM | POA: Diagnosis not present

## 2016-10-27 DIAGNOSIS — N186 End stage renal disease: Secondary | ICD-10-CM | POA: Diagnosis not present

## 2016-10-27 DIAGNOSIS — D509 Iron deficiency anemia, unspecified: Secondary | ICD-10-CM | POA: Diagnosis not present

## 2016-10-30 DIAGNOSIS — D631 Anemia in chronic kidney disease: Secondary | ICD-10-CM | POA: Diagnosis not present

## 2016-10-30 DIAGNOSIS — N186 End stage renal disease: Secondary | ICD-10-CM | POA: Diagnosis not present

## 2016-10-30 DIAGNOSIS — Z992 Dependence on renal dialysis: Secondary | ICD-10-CM | POA: Diagnosis not present

## 2016-10-30 DIAGNOSIS — T8189XD Other complications of procedures, not elsewhere classified, subsequent encounter: Secondary | ICD-10-CM | POA: Diagnosis not present

## 2016-10-30 DIAGNOSIS — I1311 Hypertensive heart and chronic kidney disease without heart failure, with stage 5 chronic kidney disease, or end stage renal disease: Secondary | ICD-10-CM | POA: Diagnosis not present

## 2016-10-30 DIAGNOSIS — D509 Iron deficiency anemia, unspecified: Secondary | ICD-10-CM | POA: Diagnosis not present

## 2016-10-30 DIAGNOSIS — N2581 Secondary hyperparathyroidism of renal origin: Secondary | ICD-10-CM | POA: Diagnosis not present

## 2016-10-30 DIAGNOSIS — I5032 Chronic diastolic (congestive) heart failure: Secondary | ICD-10-CM | POA: Diagnosis not present

## 2016-10-30 DIAGNOSIS — I2510 Atherosclerotic heart disease of native coronary artery without angina pectoris: Secondary | ICD-10-CM | POA: Diagnosis not present

## 2016-10-30 DIAGNOSIS — E119 Type 2 diabetes mellitus without complications: Secondary | ICD-10-CM | POA: Diagnosis not present

## 2016-11-01 DIAGNOSIS — N2581 Secondary hyperparathyroidism of renal origin: Secondary | ICD-10-CM | POA: Diagnosis not present

## 2016-11-01 DIAGNOSIS — E1129 Type 2 diabetes mellitus with other diabetic kidney complication: Secondary | ICD-10-CM | POA: Diagnosis not present

## 2016-11-01 DIAGNOSIS — E119 Type 2 diabetes mellitus without complications: Secondary | ICD-10-CM | POA: Diagnosis not present

## 2016-11-01 DIAGNOSIS — D631 Anemia in chronic kidney disease: Secondary | ICD-10-CM | POA: Diagnosis not present

## 2016-11-01 DIAGNOSIS — Z992 Dependence on renal dialysis: Secondary | ICD-10-CM | POA: Diagnosis not present

## 2016-11-01 DIAGNOSIS — N186 End stage renal disease: Secondary | ICD-10-CM | POA: Diagnosis not present

## 2016-11-01 DIAGNOSIS — D509 Iron deficiency anemia, unspecified: Secondary | ICD-10-CM | POA: Diagnosis not present

## 2016-11-02 ENCOUNTER — Ambulatory Visit: Payer: Medicare Other | Admitting: Physician Assistant

## 2016-11-02 DIAGNOSIS — I4891 Unspecified atrial fibrillation: Secondary | ICD-10-CM

## 2016-11-02 HISTORY — DX: Unspecified atrial fibrillation: I48.91

## 2016-11-03 DIAGNOSIS — N186 End stage renal disease: Secondary | ICD-10-CM | POA: Diagnosis not present

## 2016-11-03 DIAGNOSIS — E119 Type 2 diabetes mellitus without complications: Secondary | ICD-10-CM | POA: Diagnosis not present

## 2016-11-03 DIAGNOSIS — D631 Anemia in chronic kidney disease: Secondary | ICD-10-CM | POA: Diagnosis not present

## 2016-11-03 DIAGNOSIS — D509 Iron deficiency anemia, unspecified: Secondary | ICD-10-CM | POA: Diagnosis not present

## 2016-11-03 DIAGNOSIS — N2581 Secondary hyperparathyroidism of renal origin: Secondary | ICD-10-CM | POA: Diagnosis not present

## 2016-11-06 DIAGNOSIS — D509 Iron deficiency anemia, unspecified: Secondary | ICD-10-CM | POA: Diagnosis not present

## 2016-11-06 DIAGNOSIS — D631 Anemia in chronic kidney disease: Secondary | ICD-10-CM | POA: Diagnosis not present

## 2016-11-06 DIAGNOSIS — N186 End stage renal disease: Secondary | ICD-10-CM | POA: Diagnosis not present

## 2016-11-06 DIAGNOSIS — E119 Type 2 diabetes mellitus without complications: Secondary | ICD-10-CM | POA: Diagnosis not present

## 2016-11-06 DIAGNOSIS — N2581 Secondary hyperparathyroidism of renal origin: Secondary | ICD-10-CM | POA: Diagnosis not present

## 2016-11-07 ENCOUNTER — Other Ambulatory Visit: Payer: Self-pay | Admitting: Pulmonary Disease

## 2016-11-08 DIAGNOSIS — N186 End stage renal disease: Secondary | ICD-10-CM | POA: Diagnosis not present

## 2016-11-08 DIAGNOSIS — D509 Iron deficiency anemia, unspecified: Secondary | ICD-10-CM | POA: Diagnosis not present

## 2016-11-08 DIAGNOSIS — D631 Anemia in chronic kidney disease: Secondary | ICD-10-CM | POA: Diagnosis not present

## 2016-11-08 DIAGNOSIS — E119 Type 2 diabetes mellitus without complications: Secondary | ICD-10-CM | POA: Diagnosis not present

## 2016-11-08 DIAGNOSIS — N2581 Secondary hyperparathyroidism of renal origin: Secondary | ICD-10-CM | POA: Diagnosis not present

## 2016-11-10 DIAGNOSIS — N186 End stage renal disease: Secondary | ICD-10-CM | POA: Diagnosis not present

## 2016-11-10 DIAGNOSIS — D631 Anemia in chronic kidney disease: Secondary | ICD-10-CM | POA: Diagnosis not present

## 2016-11-10 DIAGNOSIS — N2581 Secondary hyperparathyroidism of renal origin: Secondary | ICD-10-CM | POA: Diagnosis not present

## 2016-11-10 DIAGNOSIS — E119 Type 2 diabetes mellitus without complications: Secondary | ICD-10-CM | POA: Diagnosis not present

## 2016-11-10 DIAGNOSIS — D509 Iron deficiency anemia, unspecified: Secondary | ICD-10-CM | POA: Diagnosis not present

## 2016-11-13 DIAGNOSIS — N186 End stage renal disease: Secondary | ICD-10-CM | POA: Diagnosis not present

## 2016-11-13 DIAGNOSIS — D631 Anemia in chronic kidney disease: Secondary | ICD-10-CM | POA: Diagnosis not present

## 2016-11-13 DIAGNOSIS — N2581 Secondary hyperparathyroidism of renal origin: Secondary | ICD-10-CM | POA: Diagnosis not present

## 2016-11-13 DIAGNOSIS — E119 Type 2 diabetes mellitus without complications: Secondary | ICD-10-CM | POA: Diagnosis not present

## 2016-11-13 DIAGNOSIS — D509 Iron deficiency anemia, unspecified: Secondary | ICD-10-CM | POA: Diagnosis not present

## 2016-11-15 DIAGNOSIS — N186 End stage renal disease: Secondary | ICD-10-CM | POA: Diagnosis not present

## 2016-11-15 DIAGNOSIS — E119 Type 2 diabetes mellitus without complications: Secondary | ICD-10-CM | POA: Diagnosis not present

## 2016-11-15 DIAGNOSIS — D631 Anemia in chronic kidney disease: Secondary | ICD-10-CM | POA: Diagnosis not present

## 2016-11-15 DIAGNOSIS — N2581 Secondary hyperparathyroidism of renal origin: Secondary | ICD-10-CM | POA: Diagnosis not present

## 2016-11-15 DIAGNOSIS — D509 Iron deficiency anemia, unspecified: Secondary | ICD-10-CM | POA: Diagnosis not present

## 2016-11-17 ENCOUNTER — Encounter (HOSPITAL_COMMUNITY): Payer: Medicare Other

## 2016-11-17 ENCOUNTER — Ambulatory Visit: Payer: Medicare Other | Admitting: Vascular Surgery

## 2016-11-17 DIAGNOSIS — N2581 Secondary hyperparathyroidism of renal origin: Secondary | ICD-10-CM | POA: Diagnosis not present

## 2016-11-17 DIAGNOSIS — E119 Type 2 diabetes mellitus without complications: Secondary | ICD-10-CM | POA: Diagnosis not present

## 2016-11-17 DIAGNOSIS — D509 Iron deficiency anemia, unspecified: Secondary | ICD-10-CM | POA: Diagnosis not present

## 2016-11-17 DIAGNOSIS — N186 End stage renal disease: Secondary | ICD-10-CM | POA: Diagnosis not present

## 2016-11-17 DIAGNOSIS — D631 Anemia in chronic kidney disease: Secondary | ICD-10-CM | POA: Diagnosis not present

## 2016-11-19 DIAGNOSIS — E119 Type 2 diabetes mellitus without complications: Secondary | ICD-10-CM | POA: Diagnosis not present

## 2016-11-19 DIAGNOSIS — D509 Iron deficiency anemia, unspecified: Secondary | ICD-10-CM | POA: Diagnosis not present

## 2016-11-19 DIAGNOSIS — N186 End stage renal disease: Secondary | ICD-10-CM | POA: Diagnosis not present

## 2016-11-19 DIAGNOSIS — N2581 Secondary hyperparathyroidism of renal origin: Secondary | ICD-10-CM | POA: Diagnosis not present

## 2016-11-19 DIAGNOSIS — D631 Anemia in chronic kidney disease: Secondary | ICD-10-CM | POA: Diagnosis not present

## 2016-11-20 ENCOUNTER — Inpatient Hospital Stay (HOSPITAL_COMMUNITY)
Admission: EM | Admit: 2016-11-20 | Discharge: 2016-11-23 | DRG: 280 | Disposition: A | Discharge status: Home Health | Payer: Medicare Other | Attending: Family Medicine | Admitting: Family Medicine

## 2016-11-20 ENCOUNTER — Emergency Department (HOSPITAL_COMMUNITY): Payer: Medicare Other

## 2016-11-20 ENCOUNTER — Encounter (HOSPITAL_COMMUNITY): Payer: Self-pay

## 2016-11-20 ENCOUNTER — Other Ambulatory Visit: Payer: Self-pay

## 2016-11-20 DIAGNOSIS — I214 Non-ST elevation (NSTEMI) myocardial infarction: Secondary | ICD-10-CM | POA: Diagnosis not present

## 2016-11-20 DIAGNOSIS — D631 Anemia in chronic kidney disease: Secondary | ICD-10-CM | POA: Diagnosis present

## 2016-11-20 DIAGNOSIS — Z79899 Other long term (current) drug therapy: Secondary | ICD-10-CM

## 2016-11-20 DIAGNOSIS — Z885 Allergy status to narcotic agent status: Secondary | ICD-10-CM

## 2016-11-20 DIAGNOSIS — I5042 Chronic combined systolic (congestive) and diastolic (congestive) heart failure: Secondary | ICD-10-CM | POA: Diagnosis not present

## 2016-11-20 DIAGNOSIS — Z880 Allergy status to penicillin: Secondary | ICD-10-CM

## 2016-11-20 DIAGNOSIS — E8889 Other specified metabolic disorders: Secondary | ICD-10-CM | POA: Diagnosis not present

## 2016-11-20 DIAGNOSIS — I132 Hypertensive heart and chronic kidney disease with heart failure and with stage 5 chronic kidney disease, or end stage renal disease: Secondary | ICD-10-CM | POA: Diagnosis present

## 2016-11-20 DIAGNOSIS — N186 End stage renal disease: Secondary | ICD-10-CM | POA: Diagnosis present

## 2016-11-20 DIAGNOSIS — Z9189 Other specified personal risk factors, not elsewhere classified: Secondary | ICD-10-CM

## 2016-11-20 DIAGNOSIS — Z992 Dependence on renal dialysis: Secondary | ICD-10-CM

## 2016-11-20 DIAGNOSIS — Z955 Presence of coronary angioplasty implant and graft: Secondary | ICD-10-CM

## 2016-11-20 DIAGNOSIS — I4891 Unspecified atrial fibrillation: Secondary | ICD-10-CM | POA: Diagnosis not present

## 2016-11-20 DIAGNOSIS — F17200 Nicotine dependence, unspecified, uncomplicated: Secondary | ICD-10-CM | POA: Diagnosis present

## 2016-11-20 DIAGNOSIS — Z8673 Personal history of transient ischemic attack (TIA), and cerebral infarction without residual deficits: Secondary | ICD-10-CM

## 2016-11-20 DIAGNOSIS — R079 Chest pain, unspecified: Secondary | ICD-10-CM | POA: Diagnosis not present

## 2016-11-20 DIAGNOSIS — G4730 Sleep apnea, unspecified: Secondary | ICD-10-CM | POA: Diagnosis present

## 2016-11-20 DIAGNOSIS — Z91041 Radiographic dye allergy status: Secondary | ICD-10-CM

## 2016-11-20 DIAGNOSIS — I120 Hypertensive chronic kidney disease with stage 5 chronic kidney disease or end stage renal disease: Secondary | ICD-10-CM | POA: Diagnosis not present

## 2016-11-20 DIAGNOSIS — N2581 Secondary hyperparathyroidism of renal origin: Secondary | ICD-10-CM | POA: Diagnosis present

## 2016-11-20 DIAGNOSIS — Z888 Allergy status to other drugs, medicaments and biological substances status: Secondary | ICD-10-CM

## 2016-11-20 DIAGNOSIS — I272 Pulmonary hypertension, unspecified: Secondary | ICD-10-CM

## 2016-11-20 DIAGNOSIS — F17210 Nicotine dependence, cigarettes, uncomplicated: Secondary | ICD-10-CM | POA: Diagnosis present

## 2016-11-20 DIAGNOSIS — Z905 Acquired absence of kidney: Secondary | ICD-10-CM

## 2016-11-20 DIAGNOSIS — R402413 Glasgow coma scale score 13-15, at hospital admission: Secondary | ICD-10-CM | POA: Diagnosis present

## 2016-11-20 DIAGNOSIS — M940 Chondrocostal junction syndrome [Tietze]: Secondary | ICD-10-CM | POA: Diagnosis present

## 2016-11-20 DIAGNOSIS — R569 Unspecified convulsions: Secondary | ICD-10-CM

## 2016-11-20 DIAGNOSIS — K219 Gastro-esophageal reflux disease without esophagitis: Secondary | ICD-10-CM | POA: Diagnosis present

## 2016-11-20 DIAGNOSIS — R7989 Other specified abnormal findings of blood chemistry: Secondary | ICD-10-CM | POA: Diagnosis present

## 2016-11-20 DIAGNOSIS — E785 Hyperlipidemia, unspecified: Secondary | ICD-10-CM | POA: Diagnosis present

## 2016-11-20 DIAGNOSIS — I25119 Atherosclerotic heart disease of native coronary artery with unspecified angina pectoris: Secondary | ICD-10-CM | POA: Diagnosis present

## 2016-11-20 DIAGNOSIS — E039 Hypothyroidism, unspecified: Secondary | ICD-10-CM | POA: Diagnosis present

## 2016-11-20 DIAGNOSIS — I739 Peripheral vascular disease, unspecified: Secondary | ICD-10-CM | POA: Diagnosis present

## 2016-11-20 DIAGNOSIS — Z85528 Personal history of other malignant neoplasm of kidney: Secondary | ICD-10-CM

## 2016-11-20 DIAGNOSIS — I10 Essential (primary) hypertension: Secondary | ICD-10-CM | POA: Diagnosis present

## 2016-11-20 DIAGNOSIS — R7611 Nonspecific reaction to tuberculin skin test without active tuberculosis: Secondary | ICD-10-CM | POA: Diagnosis present

## 2016-11-20 DIAGNOSIS — G40909 Epilepsy, unspecified, not intractable, without status epilepticus: Secondary | ICD-10-CM | POA: Diagnosis present

## 2016-11-20 HISTORY — DX: Unspecified atrial fibrillation: I48.91

## 2016-11-20 LAB — CBC
HEMATOCRIT: 35.2 % — AB (ref 36.0–46.0)
HEMOGLOBIN: 11 g/dL — AB (ref 12.0–15.0)
MCH: 25.6 pg — ABNORMAL LOW (ref 26.0–34.0)
MCHC: 31.3 g/dL (ref 30.0–36.0)
MCV: 81.9 fL (ref 78.0–100.0)
PLATELETS: 172 10*3/uL (ref 150–400)
RBC: 4.3 MIL/uL (ref 3.87–5.11)
RDW: 17.3 % — ABNORMAL HIGH (ref 11.5–15.5)
WBC: 3.2 10*3/uL — AB (ref 4.0–10.5)

## 2016-11-20 LAB — I-STAT CHEM 8, ED
BUN: 10 mg/dL (ref 6–20)
CHLORIDE: 92 mmol/L — AB (ref 101–111)
Calcium, Ion: 1.07 mmol/L — ABNORMAL LOW (ref 1.15–1.40)
Creatinine, Ser: 6.6 mg/dL — ABNORMAL HIGH (ref 0.44–1.00)
Glucose, Bld: 94 mg/dL (ref 65–99)
HEMATOCRIT: 36 % (ref 36.0–46.0)
HEMOGLOBIN: 12.2 g/dL (ref 12.0–15.0)
POTASSIUM: 3.5 mmol/L (ref 3.5–5.1)
Sodium: 135 mmol/L (ref 135–145)
TCO2: 33 mmol/L — ABNORMAL HIGH (ref 22–32)

## 2016-11-20 LAB — BASIC METABOLIC PANEL
ANION GAP: 14 (ref 5–15)
BUN: 8 mg/dL (ref 6–20)
CHLORIDE: 95 mmol/L — AB (ref 101–111)
CO2: 27 mmol/L (ref 22–32)
Calcium: 9.3 mg/dL (ref 8.9–10.3)
Creatinine, Ser: 6.73 mg/dL — ABNORMAL HIGH (ref 0.44–1.00)
GFR, EST AFRICAN AMERICAN: 6 mL/min — AB (ref >60)
GFR, EST NON AFRICAN AMERICAN: 5 mL/min — AB (ref >60)
Glucose, Bld: 90 mg/dL (ref 65–99)
POTASSIUM: 3.6 mmol/L (ref 3.5–5.1)
SODIUM: 136 mmol/L (ref 135–145)

## 2016-11-20 LAB — I-STAT TROPONIN, ED: TROPONIN I, POC: 0.06 ng/mL (ref 0.00–0.08)

## 2016-11-20 MED ORDER — HYDRALAZINE HCL 25 MG PO TABS
25.0000 mg | ORAL_TABLET | Freq: Two times a day (BID) | ORAL | Status: DC
  Administered 2016-11-20 – 2016-11-23 (×4): 25 mg via ORAL
  Filled 2016-11-20 (×6): qty 1

## 2016-11-20 MED ORDER — ACETAMINOPHEN 325 MG PO TABS
650.0000 mg | ORAL_TABLET | Freq: Four times a day (QID) | ORAL | Status: DC | PRN
  Administered 2016-11-21: 650 mg via ORAL
  Filled 2016-11-20: qty 2

## 2016-11-20 MED ORDER — DILTIAZEM LOAD VIA INFUSION
10.0000 mg | Freq: Once | INTRAVENOUS | Status: AC
  Administered 2016-11-20: 10 mg via INTRAVENOUS
  Filled 2016-11-20: qty 10

## 2016-11-20 MED ORDER — RENA-VITE PO TABS
1.0000 | ORAL_TABLET | Freq: Every day | ORAL | Status: DC
  Administered 2016-11-22 (×2): 1 via ORAL
  Filled 2016-11-20 (×2): qty 1

## 2016-11-20 MED ORDER — AMLODIPINE BESYLATE 10 MG PO TABS
10.0000 mg | ORAL_TABLET | Freq: Every day | ORAL | Status: DC
  Administered 2016-11-22: 10 mg via ORAL
  Filled 2016-11-20: qty 1

## 2016-11-20 MED ORDER — DILTIAZEM HCL 100 MG IV SOLR
5.0000 mg/h | INTRAVENOUS | Status: DC
  Administered 2016-11-20 – 2016-11-21 (×2): 5 mg/h via INTRAVENOUS
  Filled 2016-11-20 (×3): qty 100

## 2016-11-20 MED ORDER — CINACALCET HCL 30 MG PO TABS: 90.0000 mg | ORAL_TABLET | ORAL | Status: DC

## 2016-11-20 MED ORDER — PANTOPRAZOLE SODIUM 40 MG PO TBEC
40.0000 mg | DELAYED_RELEASE_TABLET | Freq: Every day | ORAL | Status: DC
  Administered 2016-11-21 – 2016-11-23 (×3): 40 mg via ORAL
  Filled 2016-11-20 (×3): qty 1

## 2016-11-20 MED ORDER — FERRIC CITRATE 1 GM 210 MG(FE) PO TABS
420.0000 mg | ORAL_TABLET | Freq: Three times a day (TID) | ORAL | Status: DC
  Administered 2016-11-21 – 2016-11-22 (×3): 420 mg via ORAL
  Filled 2016-11-20 (×10): qty 2

## 2016-11-20 MED ORDER — LEVETIRACETAM 500 MG PO TABS
500.0000 mg | ORAL_TABLET | Freq: Every day | ORAL | Status: DC
  Administered 2016-11-20 – 2016-11-22 (×3): 500 mg via ORAL
  Filled 2016-11-20 (×3): qty 1

## 2016-11-20 MED ORDER — DIPHENHYDRAMINE HCL 25 MG PO CAPS
50.0000 mg | ORAL_CAPSULE | ORAL | Status: DC
  Administered 2016-11-22: 50 mg via ORAL
  Filled 2016-11-20 (×2): qty 2

## 2016-11-20 MED ORDER — ONDANSETRON HCL 4 MG/2ML IJ SOLN: 4.0000 mg | Freq: Four times a day (QID) | INTRAMUSCULAR | Status: DC | PRN

## 2016-11-20 MED ORDER — CILOSTAZOL 100 MG PO TABS
100.0000 mg | ORAL_TABLET | Freq: Two times a day (BID) | ORAL | Status: DC
  Administered 2016-11-21 – 2016-11-23 (×4): 100 mg via ORAL
  Filled 2016-11-20 (×6): qty 1

## 2016-11-20 MED ORDER — ONDANSETRON HCL 4 MG PO TABS: 4.0000 mg | ORAL_TABLET | Freq: Four times a day (QID) | ORAL | Status: DC | PRN

## 2016-11-20 MED ORDER — ISOSORBIDE DINITRATE 10 MG PO TABS
20.0000 mg | ORAL_TABLET | Freq: Three times a day (TID) | ORAL | Status: DC
  Administered 2016-11-21 – 2016-11-23 (×6): 20 mg via ORAL
  Filled 2016-11-20: qty 1
  Filled 2016-11-20 (×4): qty 2
  Filled 2016-11-20: qty 1
  Filled 2016-11-20: qty 2
  Filled 2016-11-20: qty 1

## 2016-11-20 MED ORDER — CARVEDILOL 25 MG PO TABS
25.0000 mg | ORAL_TABLET | Freq: Two times a day (BID) | ORAL | Status: DC
  Administered 2016-11-20 – 2016-11-23 (×5): 25 mg via ORAL
  Filled 2016-11-20 (×2): qty 1
  Filled 2016-11-20 (×2): qty 2
  Filled 2016-11-20: qty 1

## 2016-11-20 MED ORDER — DIPHENHYDRAMINE HCL 25 MG PO CAPS
25.0000 mg | ORAL_CAPSULE | Freq: Once | ORAL | Status: AC
  Administered 2016-11-20: 25 mg via ORAL
  Filled 2016-11-20: qty 1

## 2016-11-20 MED ORDER — AMBRISENTAN 5 MG PO TABS
5.0000 mg | ORAL_TABLET | Freq: Every day | ORAL | Status: DC
  Administered 2016-11-22: 5 mg via ORAL

## 2016-11-20 MED ORDER — TRAZODONE HCL 50 MG PO TABS
50.0000 mg | ORAL_TABLET | Freq: Every evening | ORAL | Status: DC | PRN
  Administered 2016-11-21: 50 mg via ORAL
  Filled 2016-11-20: qty 1

## 2016-11-20 MED ORDER — PRAVASTATIN SODIUM 40 MG PO TABS
40.0000 mg | ORAL_TABLET | Freq: Every day | ORAL | Status: DC
  Administered 2016-11-20 – 2016-11-22 (×3): 40 mg via ORAL
  Filled 2016-11-20 (×3): qty 1

## 2016-11-20 MED ORDER — LEVOTHYROXINE SODIUM 75 MCG PO TABS
150.0000 ug | ORAL_TABLET | Freq: Every day | ORAL | Status: DC
  Administered 2016-11-22 – 2016-11-23 (×2): 150 ug via ORAL
  Filled 2016-11-20 (×3): qty 2
  Filled 2016-11-20: qty 1

## 2016-11-20 MED ORDER — ACETAMINOPHEN 650 MG RE SUPP: 650.0000 mg | Freq: Four times a day (QID) | RECTAL | Status: DC | PRN

## 2016-11-20 MED ORDER — ARGATROBAN 50 MG/50ML IV SOLN
1.0000 ug/kg/min | INTRAVENOUS | Status: DC
  Administered 2016-11-21 – 2016-11-22 (×4): 1 ug/kg/min via INTRAVENOUS
  Filled 2016-11-20 (×6): qty 50

## 2016-11-20 MED ORDER — NITROGLYCERIN 0.4 MG SL SUBL
0.4000 mg | SUBLINGUAL_TABLET | SUBLINGUAL | Status: DC | PRN
  Administered 2016-11-20 – 2016-11-22 (×8): 0.4 mg via SUBLINGUAL
  Filled 2016-11-20 (×4): qty 1

## 2016-11-20 MED ORDER — NITROGLYCERIN 0.4 MG SL SUBL
SUBLINGUAL_TABLET | SUBLINGUAL | Status: AC
  Administered 2016-11-20: 0.4 mg via SUBLINGUAL
  Filled 2016-11-20: qty 1

## 2016-11-20 MED ORDER — OXYCODONE HCL 5 MG PO TABS
5.0000 mg | ORAL_TABLET | Freq: Four times a day (QID) | ORAL | Status: DC | PRN
Start: 2016-11-20 — End: 2016-11-23
  Administered 2016-11-21 – 2016-11-23 (×2): 5 mg via ORAL
  Filled 2016-11-20 (×2): qty 1

## 2016-11-20 MED ORDER — SODIUM CHLORIDE 0.9 % IV BOLUS (SEPSIS)
500.0000 mL | Freq: Once | INTRAVENOUS | Status: AC
  Administered 2016-11-20: 500 mL via INTRAVENOUS

## 2016-11-20 NOTE — ED Triage Notes (Signed)
Patient from home here for chest pain that began tis morning.  Patient took 2 Nitro and 324 ASA of her own.  Called EMS for persistent chest pain. EMS gave additional 2 Nitro and pain subsided.  A Fib RVR on monitor with no history of the same.  NO chest pain at the time of assessment.  A&Ox4.  Dialysis Mon, wed, Fri no missed treatments.

## 2016-11-20 NOTE — H&P (Addendum)
History and Physical    [name] DQQ:[id number] DOB: Jun 21, 1945 DOA: 11/20/2016  PCP: Glendale Chard, MD  Patient coming from: Home.  Chief Complaint: Chest pain.  HPI: [name] is a 71 y.o. female with history of CAD, ESRD on hemodialysis, anemia, hypertension, pulmonary hypertension, CVA presents to the ER with complaint of chest pain.  Patient states she has been having chest pain off and on was diagnosed with costochondritis.  Patient has had unremarkable stress test on July of this year.  Patient chest pain is more like a soreness in the chest which started happening this evening.  Patient took some sublingual nitroglycerin followed by Motrin.  Despite which patient was still having persistent chest pain and called EMS.  EMS gave additional nitroglycerin sublingually which patient chest pain improved and patient was brought to the ER.  ED Course: In the ER patient is found to have A. fib with RVR which is new onset.  Patient denies any shortness of breath.  Patient is on a new schedule for hemodialysis for the holidays and has had dialysis yesterday and is due for Tuesday.  Patient at this time is chest pain-free.  Patient is being admitted for new onset A. fib with RVR.  Review of Systems: As per HPI, rest all negative.   Past Medical History:  Diagnosis Date  . Anemia    History  of Blood transfusion  . CAD (coronary artery disease)    stent to RCA  . Cancer ([place])    clear cell cancer, kidney  . Chronic renal insufficiency    On hemodialysis  . Complication of anesthesia 12/2010   pt is very confused, with AMS with anesthesia  . CVA 07/27/2008   CVA affected cognition and memory per family, no focal deficits.    . CVA (cerebral infarction) 2003   no apparent residual  . Diastolic congestive heart failure ([place])   . Dyslipidemia   . Encephalopathy   . ESRD 07/27/2008   ESRD due to HTN and NSAID's, started hemodialysis in 2005 in [place], [place]. Went to  [facility] from 2010 to 2012 and since 2012 has been getting dialysis at [facility] on [address] in [place] on a MWF schedule. First access with RUA AVG placed in [place]. Next and current access was LUA AVG placed by Dr. Lucky Cowboy in [place] in or around 2012. Has had 2 or 3 procedures on that graft since placed per family. She gets her access work done here in [place] now. She is allergic to heparin and does not get any heparin at dialysis; she had an allergic reaction apparently when in ICU in the past.      . GERD (gastroesophageal reflux disease)   . Hyperlipidemia   . Hypertension   . Hypothyroidism   . Positive PPD    completed rifampin  . Pulmonary hypertension ([place])   . PVD (peripheral vascular disease) ([place])   . Seizures ([place])    last seizure 6 years ago  . Sleep apnea    Sleep Study 2008  . Traumatic seroma of left lower leg     Past Surgical History:  Procedure Laterality Date  . Abdominal Aortogram N/A 12/31/2014   Performed by Conrad [place], MD at [facility]  . Abdominal Aortogram w/Lower Extremity Bilateral 06/15/2016   Performed by Conrad , MD at [facility]  . ABDOMINAL HYSTERECTOMY    . APPENDECTOMY    . APPLICATION OF WOUND  VAC Left 09/12/2016   Performed by Conrad Gabbs, MD at [place]  . ARTERY EXPLORATION LEFT ABOVE KNEE POPLITEAL AND GROIN Left 07/25/2016   Performed by Conrad [place], MD at [place]  . CARDIAC CATHETERIZATION     last 2016  . CHOLECYSTECTOMY    . D&Cs    . EVACUATION HEMATOMA Left 09/12/2016   Performed by Conrad [place], MD at [place]  . LEFT HEART CATHETERIZATION WITH CORONARY ANGIOGRAM N/A 02/22/2011   Performed by Wellington [place], MD at [facility]  . left nephrectomy    . NEPHRECTOMY Left    Malignant tumor  . right ankle repair    . RIGHT HEART CATH N/A 01/29/2014   Performed by Larey Dresser, MD at [facility] CATH LAB  . TONSILLECTOMY    . VASCULAR SURGERY  11/2010   graft inserted to left arm     reports  that she has been smoking cigarettes.  She has been smoking about 0.00 packs per day for the past 53.00 years. she has never used smokeless tobacco. She reports that she drinks alcohol. She reports that she does not use drugs.  Allergies  Allergen Reactions  . Ace Inhibitors Anaphylaxis and Rash  . Heparin Other (See Comments)    MDs told her not to take after reaction in ICU  . Iohexol Swelling and Other (See Comments)    1970s; passed out and had facial/tongue swelling.  Requires 13-hour prep with prednisone and benadryl  . Phenytoin Other (See Comments)    Had reaction while in ICU; doesn't know.  MDs told her not to take ever again.  . Wellbutrin [Bupropion] Other (See Comments)    seizures  . Meperidine Hcl Swelling and Rash    Makes tongue swell  . Morphine Rash  . Penicillins Hives and Rash    Has patient had a PCN reaction causing immediate rash, facial/tongue/throat swelling, SOB or lightheadedness with hypotension: Yes Has patient had a PCN reaction causing severe rash involving mucus membranes or skin necrosis: No Has patient had a PCN reaction that required hospitalization: No Has patient had a PCN reaction occurring within the last 10 years: No If all of the above answers are "NO", then may proceed with Cephalosporin use.    . Valproic Acid And Related Other (See Comments)    Confusion   . Iodinated Diagnostic Agents Other (See Comments)    Pre-meditate with benadryl and prednisone 3 times before appt.  . Pentazocine Lactate Other (See Comments)    Patient does not remember reaction to this med (Talwin).     Family History  Problem Relation Age of Onset  . Heart disease Father   . Hypertension Mother   . Dementia Mother   . Coronary artery disease Sister   . Heart attack Sister   . Hypertension Brother     Prior to Admission medications   Medication Sig Start Date End Date Taking? Authorizing Provider  acetaminophen (TYLENOL) 500 MG tablet Take 1,000 mg by  mouth daily as needed for mild pain.    Yes [provider]  amLODipine (NORVASC) 10 MG tablet Take 10 mg by mouth at bedtime.    Yes [provider]  aspirin EC 81 MG tablet Take 81 mg by mouth daily.   Yes [provider]  carvedilol (COREG) 25 MG tablet Take 25 mg by mouth 2 (two) times daily with a meal.   Yes [provider]  cilostazol (PLETAL)  100 MG tablet Take 1 tablet (100 mg total) by mouth 2 (two) times daily before a meal. 05/24/16  Yes Conrad [place], MD  diphenhydrAMINE (BENADRYL) 50 MG capsule Take 1 capsule (50 mg total) by mouth every Monday, Wednesday, and Friday. On dialysis days 09/15/16  Yes Rhyne, Samantha J, PA-C  ferric citrate (AURYXIA) 1 GM 210 MG(Fe) tablet Take 420 mg by mouth 3 (three) times daily with meals. And 1 tablet with snacks   Yes [provider]  folic acid-vitamin b complex-vitamin c-selenium-zinc (DIALYVITE) 3 MG TABS tablet Take 1 tablet by mouth daily.   Yes [provider]  hydrALAZINE (APRESOLINE) 25 MG tablet Take 25 mg 2 (two) times daily by mouth.    Yes [provider]  isosorbide dinitrate (ISORDIL) 20 MG tablet Take 1 tablet (20 mg total) by mouth 3 (three) times daily. 10/26/16  Yes Hochrein, Jeneen Rinks, MD  LETAIRIS 5 MG tablet TAKE 1 TABLET (5MG ) BY MOUTH DAILY. DO NOT HANDLE IF PREGNANT. DO NOTSPLIT, CRUSH, OR CHEW. AVOID INHALATION AND CONTACT WITH SKIN OR EYES. 09/18/16  Yes Juanito Doom, MD  levETIRAcetam (KEPPRA) 500 MG tablet Take 500 mg by mouth daily at 8 pm.   Yes [provider]  levothyroxine (SYNTHROID, LEVOTHROID) 150 MCG tablet Take 150 mcg by mouth daily before breakfast.   Yes [provider]  nitroGLYCERIN (NITROSTAT) 0.4 MG SL tablet Place 1 tablet (0.4 mg total) under the tongue every 5 (five) minutes as needed for chest pain. 10/26/16  Yes Minus Breeding, MD  omeprazole (PRILOSEC OTC) 20 MG tablet Take 20 mg by mouth daily.   Yes [provider]  oxyCODONE (ROXICODONE) 5 MG immediate release tablet Take 1 tablet (5 mg total) by mouth every 6 (six) hours as needed for severe pain. 09/14/16  Yes Rhyne, Samantha J, PA-C  pravastatin (PRAVACHOL) 40 MG tablet Take 1 tablet (40 mg total) by mouth at bedtime. 05/23/16  Yes Minus Breeding, MD  SENSIPAR 90 MG tablet Take 90 mg by mouth every Monday, Wednesday, and Friday with hemodialysis.  03/05/14  Yes [provider]  traMADol (ULTRAM) 50 MG tablet Take 1 tablet (50 mg total) by mouth every 12 (twelve) hours as needed for moderate pain. 07/26/16  Yes Rhyne, Hulen Shouts, PA-C  traZODone (DESYREL) 50 MG tablet Take 50 mg by mouth at bedtime as needed for sleep.    Yes [provider]    Physical Exam: Vitals:   11/20/16 1930 11/20/16 2015 11/20/16 2115 11/20/16 2200  BP: (!) 142/84 115/85 124/66 114/77  Pulse: (!) 138 (!) 108 (!) 109 (!) 138  Resp: (!) 30 (!) 23 13 17   SpO2: 95% 96% 98% 96%      Constitutional: Moderately built and nourished. Vitals:   11/20/16 1930 11/20/16 2015 11/20/16 2115 11/20/16 2200  BP: (!) 142/84 115/85 124/66 114/77  Pulse: (!) 138 (!) 108 (!) 109 (!) 138  Resp: (!) 30 (!) 23 13 17   SpO2: 95% 96% 98% 96%   Eyes: Anicteric no pallor. ENMT: No discharge from the ears eyes nose and mouth. Neck: No mass felt.  No neck rigidity.  No JVD appreciated. Respiratory: No rhonchi or crepitations. Cardiovascular: S1-S2 heard no murmurs appreciated. Abdomen: Soft nontender bowel sounds present. Musculoskeletal: No edema.  No joint effusion. Skin: No rash.  Skin appears warm. Neurologic: Alert awake oriented to time place and person.  Moves all extremities. Psychiatric: Appears normal.  Normal affect.   Labs on Admission: I  have personally reviewed following labs and imaging studies  CBC: Recent Labs  Lab 11/20/16 1653 11/20/16 1703  WBC 3.2*  --   HGB 11.0* 12.2  HCT 35.2* 36.0  MCV 81.9  --   PLT 172  --    Basic Metabolic  Panel: Recent Labs  Lab 11/20/16 1653 11/20/16 1703  NA 136 135  K 3.6 3.5  CL 95* 92*  CO2 27  --   GLUCOSE 90 94  BUN 8 10  CREATININE 6.73* 6.60*  CALCIUM 9.3  --    GFR: CrCl cannot be calculated (Unknown ideal weight.). Liver Function Tests: No results for input(s): AST, ALT, ALKPHOS, BILITOT, PROT, ALBUMIN in the last 168 hours. No results for input(s): LIPASE, AMYLASE in the last 168 hours. No results for input(s): AMMONIA in the last 168 hours. Coagulation Profile: No results for input(s): INR, PROTIME in the last 168 hours. Cardiac Enzymes: No results for input(s): CKTOTAL, CKMB, CKMBINDEX, TROPONINI in the last 168 hours. BNP (last 3 results) No results for input(s): PROBNP in the last 8760 hours. HbA1C: No results for input(s): HGBA1C in the last 72 hours. CBG: No results for input(s): GLUCAP in the last 168 hours. Lipid Profile: No results for input(s): CHOL, HDL, LDLCALC, TRIG, CHOLHDL, LDLDIRECT in the last 72 hours. Thyroid Function Tests: No results for input(s): TSH, T4TOTAL, FREET4, T3FREE, THYROIDAB in the last 72 hours. Anemia Panel: No results for input(s): VITAMINB12, FOLATE, FERRITIN, TIBC, IRON, RETICCTPCT in the last 72 hours. Urine analysis:    Component Value Date/Time   COLORURINE YELLOW 09/03/2014 0018   APPEARANCEUR CLOUDY (A) 09/03/2014 0018   LABSPEC 1.010 09/03/2014 0018   PHURINE 5.5 09/03/2014 0018   GLUCOSEU 250 (A) 09/03/2014 0018   HGBUR TRACE (A) 09/03/2014 0018   BILIRUBINUR NEGATIVE 09/03/2014 0018   KETONESUR NEGATIVE 09/03/2014 0018   PROTEINUR 100 (A) 09/03/2014 0018   UROBILINOGEN 0.2 09/03/2014 0018   NITRITE NEGATIVE 09/03/2014 0018   LEUKOCYTESUR NEGATIVE 09/03/2014 0018   Sepsis Labs: @LABRCNTIP (procalcitonin:4,lacticidven:4) )No results found for this or any previous visit (from the past 240 hour(s)).   Radiological Exams on Admission: Dg Chest 2 View  Result Date: 11/20/2016 CLINICAL DATA:  Chest pain for 1  day. EXAM: CHEST  2 VIEW COMPARISON:  09/03/2014 and older exams. FINDINGS: Cardiac silhouette is mildly enlarged. No mediastinal or hilar masses or evidence of adenopathy. There are thickened interstitial markings bilaterally similar to the most recent prior study. No lung consolidation. There are small bilateral pleural effusions. No pneumothorax. Skeletal structures are grossly intact. IMPRESSION: 1. Cardiomegaly, interstitial thickening and small pleural effusions. Findings consistent with mild congestive heart failure with interstitial edema. Electronically Signed   By: Lajean Manes M.D.   On: 11/20/2016 18:06    EKG: Independently reviewed.  A. fib with RVR.  Assessment/Plan Principal Problem:   Atrial fibrillation with RVR (HCC) Active Problems:   TOBACCO USER   PVD   Seizure (HCC)   HTN (hypertension)   Pulmonary hypertension (HCC)   ESRD (end stage renal disease) ([place])    1. A. fib with RVR new onset -patient states he has some severe allergic reaction to heparin previously.  Exact reaction not known.  Given the patient's chads 2 vasc score of at least 4 patient is placed on [place] for now.  Check cardiac markers 2D echo TSH.  Patient is on Coreg.  Patient will be continued on Cardizem infusion which will be slowly tapered off once patient's Coreg is taken.  2. Chest pain -has had unremarkable stress test in July.  We will continue to monitor cardiac markers.  Patient is Argotroban. 3. Pulmonary hypertension will continue to [facility]. 4. ESRD on hemodialysis due for dialysis on Tuesday.  Chest x-ray shows congestion.  Not in acute distress.  Please consult nephrology in a.m. 5. History of seizures on Keppra. 6. Anemia likely from renal disease.  Follow CBC. 7. Peripheral vascular disease has undergone recent failed attempt for lower extremity bypass.  Had developed seroma which was evacuated. 8. Hypertension on Coreg hydralazine amlodipine and Isordil. 9. Systolic dysfunction  -fluid management per nephrologist.  I have reviewed patient's old charts and labs.  Addendum - patient's cardiac markers have shown increasing trend for which I have consulted cardiology.   DVT prophylaxis: Argotroban. Code Status: Full code. Family Communication: Discussed with patient. Disposition Plan: Home. Consults called: Cardiology. Admission status: Observation.   Rise Patience MD [facility] Pager [phone].  If 7PM-7AM, please contact night-coverage [email or web address] Password [email or web address]  11/20/2016, 10:39 PM

## 2016-11-20 NOTE — ED Notes (Signed)
Pt assisted to the bathroom with this RN Pt denies SOB, ambulatory with steady gait.

## 2016-11-20 NOTE — Progress Notes (Addendum)
ANTICOAGULATION CONSULT NOTE - Initial Consult  Pharmacy Consult for argatroban Indication: atrial fibrillation  Allergies  Allergen Reactions  . Ace Inhibitors Anaphylaxis and Rash  . Heparin Other (See Comments)    MDs told her not to take after reaction in ICU  . Iohexol Swelling and Other (See Comments)    1970s; passed out and had facial/tongue swelling.  Requires 13-hour prep with prednisone and benadryl  . Phenytoin Other (See Comments)    Had reaction while in ICU; doesn't know.  MDs told her not to take ever again.  . Wellbutrin [Bupropion] Other (See Comments)    seizures  . Meperidine Hcl Swelling and Rash    Makes tongue swell  . Morphine Rash  . Penicillins Hives and Rash    Has patient had a PCN reaction causing immediate rash, facial/tongue/throat swelling, SOB or lightheadedness with hypotension: Yes Has patient had a PCN reaction causing severe rash involving mucus membranes or skin necrosis: No Has patient had a PCN reaction that required hospitalization: No Has patient had a PCN reaction occurring within the last 10 years: No If all of the above answers are "NO", then may proceed with Cephalosporin use.    . Valproic Acid And Related Other (See Comments)    Confusion   . Iodinated Diagnostic Agents Other (See Comments)    Pre-meditate with benadryl and prednisone 3 times before appt.  . Pentazocine Lactate Other (See Comments)    Patient does not remember reaction to this med (Talwin).     Patient Measurements: Height: 5' 2.5" (158.8 cm) Weight: 172 lb 13.5 oz (78.4 kg) IBW/kg (Calculated) : 51.25  Vital Signs: BP: 114/77 (11/19 2200) Pulse Rate: 138 (11/19 2200)  Labs: Recent Labs    11/20/16 1653 11/20/16 1703  HGB 11.0* 12.2  HCT 35.2* 36.0  PLT 172  --   CREATININE 6.73* 6.60*    Estimated Creatinine Clearance: 7.6 mL/min (A) (by C-G formula based on SCr of 6.6 mg/dL (H)).   Medical History: Past Medical History:  Diagnosis Date  .  Anemia    History  of Blood transfusion  . CAD (coronary artery disease)    stent to RCA  . Cancer ([place])    clear cell cancer, kidney  . Chronic renal insufficiency    On hemodialysis  . Complication of anesthesia 12/2010   pt is very confused, with AMS with anesthesia  . CVA 07/27/2008   CVA affected cognition and memory per family, no focal deficits.    . CVA (cerebral infarction) 2003   no apparent residual  . Diastolic congestive heart failure ([place])   . Dyslipidemia   . Encephalopathy   . ESRD 07/27/2008   ESRD due to HTN and NSAID's, started hemodialysis in 2005 in [place], [place]. Went to [facility] from 2010 to 2012 and since 2012 has been getting dialysis at [facility] on [address] in [place] on a MWF schedule. First access with RUA AVG placed in [place]. Next and current access was LUA AVG placed by Dr. Lucky Cowboy in [place] in or around 2012. Has had 2 or 3 procedures on that graft since placed per family. She gets her access work done here in [place] now. She is allergic to heparin and does not get any heparin at dialysis; she had an allergic reaction apparently when in ICU in the past.      . GERD (gastroesophageal reflux disease)   . Hyperlipidemia   . Hypertension   . Hypothyroidism   .  Positive PPD    completed rifampin  . Pulmonary hypertension ([place])   . PVD (peripheral vascular disease) ([place])   . Seizures ([place])    last seizure 6 years ago  . Sleep apnea    Sleep Study 2008  . Traumatic seroma of left lower leg     Assessment: 71yo female c/o chest discomfort and weakness, found to be in Afib w/ RVR, to begin argatroban 2/2 heparin allergy.  Goal of Therapy:  aPTT 50-90 seconds Monitor platelets by anticoagulation protocol: Yes   Plan:  Will begin argatroban infusion at 1 mcg/kg/min and monitor PTT and CBC.  Wynona Neat, PharmD, BCPS  11/20/2016,11:02 PM   ADDENDUM: Initial PTT of 63 at goal; will continue argatroban with no changes for now  and check additional PTT to confirm. VB      11/21/2016 4:19 AM

## 2016-11-20 NOTE — ED Provider Notes (Addendum)
[facility] PROGRESSIVE CARE Provider Note   CSN: [id number] Arrival date & time: 11/20/16  1605     History   Chief Complaint Chief Complaint  Patient presents with  . Chest Pain  . Atrial Fibrillation    HPI [name] is a 71 y.o. female.  The patient presents for evaluation of chest discomfort, recurrent, for several days.  Today she took nitroglycerin, and called EMS.  During transport she was treated with 2 additional nitroglycerin tablets.  After this treatment she had resolution of her chest discomfort.  She states that the chest pain is similar to her usual "costochondritis."  He describes his pain as a soreness.  She overall feels weak, and has felt badly for 3 days, but is vague about what this means.  She did participate in her usual dialysis treatment, 3 days ago and had another treatment yesterday, in preparation for the upcoming holiday.  She denies fever, chills, nausea, vomiting.  She had some chest discomfort similar to costochondritis 3 days ago, and at that time she took 2 nitroglycerin, and went to dialysis.  She is taking her usual medications.  There are no other known modifying factors.  HPI  Past Medical History:  Diagnosis Date  . Anemia    History  of Blood transfusion  . Atrial fibrillation with RVR ([place]) 11/2016  . CAD (coronary artery disease)    stent to RCA  . Cancer ([place])    clear cell cancer, kidney  . Chronic renal insufficiency    On hemodialysis  . Complication of anesthesia 12/2010   pt is very confused, with AMS with anesthesia  . CVA 07/27/2008   CVA affected cognition and memory per family, no focal deficits.    . CVA (cerebral infarction) 2003   no apparent residual  . Diastolic congestive heart failure ([place])   . Dyslipidemia   . Encephalopathy   . ESRD 07/27/2008   ESRD due to HTN and NSAID's, started hemodialysis in 2005 in [place], [place]. Went to [facility] from 2010 to 2012 and since 2012 has been getting  dialysis at [facility] on [address] in [place] on a MWF schedule. First access with RUA AVG placed in [place]. Next and current access was LUA AVG placed by Dr. Lucky Cowboy in [place] in or around 2012. Has had 2 or 3 procedures on that graft since placed per family. She gets her access work done here in [place] now. She is allergic to heparin and does not get any heparin at dialysis; she had an allergic reaction apparently when in ICU in the past.      . GERD (gastroesophageal reflux disease)   . Hyperlipidemia   . Hypertension   . Hypothyroidism   . Positive PPD    completed rifampin  . Pulmonary hypertension ([place])   . PVD (peripheral vascular disease) ([place])   . Seizures ([place])    last seizure 6 years ago  . Sleep apnea    Sleep Study 2008  . Traumatic seroma of left lower leg     Patient Active Problem List   Diagnosis Date Noted  . Atrial fibrillation ([place]) [I48.91] 12/08/2016  . Long term (current) use of anticoagulants [Z79.01] 12/08/2016  . NSTEMI (non-ST elevated myocardial infarction) ([place]) 11/21/2016  . Atrial fibrillation with RVR ([place]) 11/20/2016  . Precordial pain 10/26/2016  . Hematoma 09/12/2016  . Dyslipidemia 05/25/2016  . Coronary artery disease involving native coronary artery of native heart without angina pectoris 05/25/2016  .  Atherosclerosis of native arteries of extremity with intermittent claudication ([place]) 12/16/2014  . Nausea vomiting and diarrhea 09/03/2014  . Hyperkalemia 09/03/2014  . Diarrhea 09/03/2014  . ESRD (end stage renal disease) ([place])   . Pulmonary hypertension ([place]) 09/23/2012  . Hyperkalemia, diminished renal excretion 06/27/2012  . Generalized convulsive epilepsy without mention of intractable epilepsy 09/14/2011  . Epilepsy ([place]) 05/04/2011  . Physical deconditioning 05/04/2011  . HTN (hypertension) 04/30/2011  . Depression 04/30/2011  . GERD (gastroesophageal reflux disease) 04/30/2011  . Seizure ([place]) 04/29/2011  . Nonspecific  abnormal electrocardiogram (ECG) (EKG) 02/23/2011  . Essential hypertension, malignant 05/11/2009  . Chronic diastolic heart failure ([place]) 05/11/2009  . TOBACCO USER 07/27/2008  . Coronary atherosclerosis 07/27/2008  . CVA 07/27/2008  . PVD 07/27/2008  . ESRD 07/27/2008  . OTHER AND UNSPECIFIED HYPERLIPIDEMIA 07/24/2008    Past Surgical History:  Procedure Laterality Date  . ABDOMINAL AORTOGRAM W/LOWER EXTREMITY Bilateral 06/15/2016   Procedure: Abdominal Aortogram w/Lower Extremity;  Surgeon: Conrad Wrightwood, MD;  Location: [facility];  Service: Cardiovascular;  Laterality: Bilateral;  . ABDOMINAL HYSTERECTOMY    . APPENDECTOMY    . APPLICATION OF WOUND VAC Left 09/12/2016   Procedure: APPLICATION OF WOUND VAC;  Surgeon: Conrad Elliston, MD;  Location: [facility];  Service: Vascular;  Laterality: Left;  . ARTERY EXPLORATION Left 07/25/2016   Procedure: ARTERY EXPLORATION LEFT ABOVE KNEE POPLITEAL AND GROIN;  Surgeon: Conrad Coward, MD;  Location: [facility];  Service: Vascular;  Laterality: Left;  . CARDIAC CATHETERIZATION     last 2016  . CHOLECYSTECTOMY    . D&Cs    . HEMATOMA EVACUATION Left 09/12/2016   Procedure: EVACUATION HEMATOMA;  Surgeon: Conrad Winifred, MD;  Location: [facility];  Service: Vascular;  Laterality: Left;  . LEFT HEART CATH AND CORONARY ANGIOGRAPHY N/A 11/22/2016   Procedure: LEFT HEART CATH AND CORONARY ANGIOGRAPHY;  Surgeon: Belva Crome, MD;  Location: [facility];  Service: Cardiovascular;  Laterality: N/A;  . LEFT HEART CATHETERIZATION WITH CORONARY ANGIOGRAM N/A 02/22/2011   Procedure: LEFT HEART CATHETERIZATION WITH CORONARY ANGIOGRAM;  Surgeon: Wellington Hampshire, MD;  Location: [facility];  Service: Cardiovascular;  Laterality: N/A;  . left nephrectomy    . NEPHRECTOMY Left    Malignant tumor  . PERIPHERAL VASCULAR CATHETERIZATION N/A 12/31/2014   Procedure: Abdominal Aortogram;  Surgeon: Conrad , MD;  Location: [facility];  Service:  Cardiovascular;  Laterality: N/A;  . right ankle repair    . RIGHT HEART CATHETERIZATION N/A 01/29/2014   Procedure: RIGHT HEART CATH;  Surgeon: Larey Dresser, MD;  Location: [facility] C[facility];  Service: Cardiovascular;  Laterality: N/A;  . TONSILLECTOMY    . ULTRASOUND GUIDANCE FOR VASCULAR ACCESS  11/22/2016   Procedure: Ultrasound Guidance For Vascular Access;  Surgeon: Belva Crome, MD;  Location: [facility];  Service: Cardiovascular;;  . VASCULAR SURGERY  11/2010   graft inserted to left arm    OB History    No data available       Home Medications    Prior to Admission medications   Medication Sig Start Date End Date Taking? Authorizing Provider  acetaminophen (TYLENOL) 500 MG tablet Take 1,000 mg by mouth daily as needed for mild pain.    Yes [provider]  carvedilol (COREG) 25 MG tablet Take 25 mg by mouth 2 (two) times daily with a meal.   Yes [provider]  cilostazol (  PLETAL) 100 MG tablet Take 1 tablet (100 mg total) by mouth 2 (two) times daily before a meal. 05/24/16  Yes Conrad Beckemeyer, MD  diphenhydrAMINE (BENADRYL) 50 MG capsule Take 1 capsule (50 mg total) by mouth every Monday, Wednesday, and Friday. On dialysis days 09/15/16  Yes Rhyne, Samantha J, PA-C  ferric citrate (AURYXIA) 1 GM 210 MG(Fe) tablet Take 420 mg by mouth 3 (three) times daily with meals. And 1 tablet with snacks   Yes [provider]  folic acid-vitamin b complex-vitamin c-selenium-zinc (DIALYVITE) 3 MG TABS tablet Take 1 tablet by mouth daily.   Yes [provider]  hydrALAZINE (APRESOLINE) 25 MG tablet Take 25 mg 2 (two) times daily by mouth.    Yes [provider]  isosorbide dinitrate (ISORDIL) 20 MG tablet Take 1 tablet (20 mg total) by mouth 3 (three) times daily. 10/26/16  Yes Minus Breeding, MD  LETAIRIS 5 MG tablet TAKE 1 TABLET (5MG ) BY MOUTH DAILY. DO NOT HANDLE IF PREGNANT. DO NOTSPLIT, CRUSH, OR CHEW. AVOID INHALATION AND CONTACT WITH  SKIN OR EYES. 09/18/16  Yes Juanito Doom, MD  levETIRAcetam (KEPPRA) 500 MG tablet Take 500 mg by mouth daily at 8 pm.   Yes [provider]  levothyroxine (SYNTHROID, LEVOTHROID) 150 MCG tablet Take 150 mcg by mouth daily before breakfast.   Yes [provider]  nitroGLYCERIN (NITROSTAT) 0.4 MG SL tablet Place 1 tablet (0.4 mg total) under the tongue every 5 (five) minutes as needed for chest pain. 10/26/16  Yes Minus Breeding, MD  omeprazole (PRILOSEC OTC) 20 MG tablet Take 20 mg by mouth daily.   Yes [provider]  oxyCODONE (ROXICODONE) 5 MG immediate release tablet Take 1 tablet (5 mg total) by mouth every 6 (six) hours as needed for severe pain. 09/14/16  Yes Rhyne, Samantha J, PA-C  pravastatin (PRAVACHOL) 40 MG tablet Take 1 tablet (40 mg total) by mouth at bedtime. 05/23/16  Yes Minus Breeding, MD  SENSIPAR 90 MG tablet Take 90 mg by mouth every Monday, Wednesday, and Friday with hemodialysis.  03/05/14  Yes [provider]  traZODone (DESYREL) 50 MG tablet Take 50 mg by mouth at bedtime as needed for sleep.    Yes [provider]  diltiazem (CARDIZEM CD) 120 MG 24 hr capsule Take 1 capsule (120 mg total) by mouth daily. 11/24/16   Doreatha Lew, MD  warfarin (COUMADIN) 5 MG tablet Take 1 tablet (5 mg total) by mouth one time only at 6 PM. 11/23/16   Patrecia Pour, Christean Grief, MD    Family History Family History  Problem Relation Age of Onset  . Heart disease Father   . Hypertension Mother   . Dementia Mother   . Coronary artery disease Sister   . Heart attack Sister   . Hypertension Brother     Social History Social History   Tobacco Use  . Smoking status: Current Some Day Smoker    Packs/day: 0.00    Years: 53.00    Pack years: 0.00    Types: Cigarettes  . Smokeless tobacco: Never Used  Substance Use Topics  . Alcohol use: Yes    Alcohol/week: 0.0 oz    Comment: very rarely per pt  . Drug use: No    Comment: former  marijuana use, several years     Allergies   Ace inhibitors; Heparin; Iohexol; Phenytoin; Wellbutrin [bupropion]; Meperidine hcl; Morphine; Penicillins; Valproic acid and related; Iodinated diagnostic agents; and Pentazocine lactate  Review of Systems Review of Systems  All other systems reviewed and are negative.    Physical Exam Updated Vital Signs BP (!) 158/68 (BP Location: Right Arm)   Pulse 73   Temp 97.6 F (36.4 C) (Oral)   Resp 16   Ht 5' 2.5" (1.588 m)   Wt 76.7 kg (169 lb)   SpO2 93%   BMI 30.42 kg/m   Physical Exam  Constitutional: She is oriented to person, place, and time. She appears well-developed and well-nourished. She does not appear ill.  HENT:  Head: Normocephalic and atraumatic.  Right Ear: External ear normal.  Left Ear: External ear normal.  Eyes: Conjunctivae and EOM are normal. Pupils are equal, round, and reactive to light.  Neck: Normal range of motion and phonation normal. Neck supple.  Cardiovascular: Normal heart sounds.  Irregular tachycardia  Pulmonary/Chest: Effort normal and breath sounds normal. She exhibits no bony tenderness.  Abdominal: Soft. There is no tenderness.  Musculoskeletal: Normal range of motion.  Neurological: She is alert and oriented to person, place, and time. No cranial nerve deficit or sensory deficit. She exhibits normal muscle tone. Coordination normal.  Skin: Skin is warm, dry and intact.  Psychiatric: She has a normal mood and affect. Her behavior is normal. Judgment and thought content normal.  Nursing note and vitals reviewed.    ED Treatments / Results  Labs (all labs ordered are listed, but only abnormal results are displayed) Labs Reviewed  CBC - Abnormal; Notable for the following components:      Result Value   WBC 3.2 (*)    Hemoglobin 11.0 (*)    HCT 35.2 (*)    MCH 25.6 (*)    RDW 17.3 (*)    All other components within normal limits  BASIC METABOLIC PANEL - Abnormal; Notable for the  following components:   Chloride 95 (*)    Creatinine, Ser 6.73 (*)    GFR calc non Af Amer 5 (*)    GFR calc Af Amer 6 (*)    All other components within normal limits  BASIC METABOLIC PANEL - Abnormal; Notable for the following components:   Chloride 95 (*)    Creatinine, Ser 7.58 (*)    GFR calc non Af Amer 5 (*)    GFR calc Af Amer 5 (*)    All other components within normal limits  TROPONIN I - Abnormal; Notable for the following components:   Troponin I 0.95 (*)    All other components within normal limits  TROPONIN I - Abnormal; Notable for the following components:   Troponin I 2.06 (*)    All other components within normal limits  TROPONIN I - Abnormal; Notable for the following components:   Troponin I 3.12 (*)    All other components within normal limits  CBC - Abnormal; Notable for the following components:   WBC 2.9 (*)    Hemoglobin 10.4 (*)    HCT 33.1 (*)    MCH 25.6 (*)    RDW 17.4 (*)    Platelets 147 (*)    All other components within normal limits  APTT - Abnormal; Notable for the following components:   aPTT 63 (*)    All other components within normal limits  APTT - Abnormal; Notable for the following components:   aPTT 60 (*)    All other components within normal limits  APTT - Abnormal; Notable for the following components:   aPTT 62 (*)    All  other components within normal limits  RENAL FUNCTION PANEL - Abnormal; Notable for the following components:   Chloride 95 (*)    Creatinine, Ser 7.57 (*)    Phosphorus 4.7 (*)    Albumin 2.9 (*)    GFR calc non Af Amer 5 (*)    GFR calc Af Amer 6 (*)    All other components within normal limits  RENAL FUNCTION PANEL - Abnormal; Notable for the following components:   Sodium 134 (*)    Chloride 95 (*)    Glucose, Bld 105 (*)    Creatinine, Ser 8.29 (*)    Phosphorus 4.9 (*)    Albumin 2.9 (*)    GFR calc non Af Amer 4 (*)    GFR calc Af Amer 5 (*)    All other components within normal limits  CBC  - Abnormal; Notable for the following components:   WBC 3.4 (*)    Hemoglobin 11.0 (*)    HCT 35.1 (*)    MCH 25.6 (*)    RDW 17.1 (*)    Platelets 138 (*)    All other components within normal limits  CBC - Abnormal; Notable for the following components:   WBC 3.9 (*)    Hemoglobin 10.6 (*)    HCT 33.9 (*)    MCH 25.3 (*)    RDW 17.5 (*)    All other components within normal limits  APTT - Abnormal; Notable for the following components:   aPTT 59 (*)    All other components within normal limits  BASIC METABOLIC PANEL - Abnormal; Notable for the following components:   Sodium 134 (*)    Chloride 96 (*)    BUN <5 (*)    Creatinine, Ser 3.50 (*)    GFR calc non Af Amer 12 (*)    GFR calc Af Amer 14 (*)    All other components within normal limits  PROTIME-INR - Abnormal; Notable for the following components:   Prothrombin Time 19.8 (*)    All other components within normal limits  APTT - Abnormal; Notable for the following components:   aPTT 67 (*)    All other components within normal limits  CBC - Abnormal; Notable for the following components:   WBC 3.9 (*)    RBC 3.78 (*)    Hemoglobin 9.7 (*)    HCT 30.7 (*)    MCH 25.7 (*)    RDW 17.3 (*)    Platelets 139 (*)    All other components within normal limits  APTT - Abnormal; Notable for the following components:   aPTT 72 (*)    All other components within normal limits  I-STAT CHEM 8, ED - Abnormal; Notable for the following components:   Chloride 92 (*)    Creatinine, Ser 6.60 (*)    Calcium, Ion 1.07 (*)    TCO2 33 (*)    All other components within normal limits  MRSA PCR SCREENING  TSH  I-STAT TROPONIN, ED  POCT ACTIVATED CLOTTING TIME    EKG  EKG Interpretation  Date/Time:  Tuesday November 21 2016 05:29:26 EST Ventricular Rate:  130 PR Interval:    QRS Duration: 96 QT Interval:  367 QTC Calculation: 540 R Axis:   -72 Text Interpretation:  Atrial fibrillation Ventricular premature complex LAD,  consider left anterior fascicular block Low voltage, extremity leads Abnormal R-wave progression, late transition Left ventricular hypertrophy Prolonged QT interval Confirmed by Orpah Greek [phone]) on 11/22/2016 10:02:46  AM       Radiology No results found.  Procedures .Critical Care Performed by: Daleen Bo, MD Authorized by: Daleen Bo, MD   Critical care provider statement:    Critical care time (minutes):  40   Critical care start time:  11/20/2016 4:25 PM   Critical care end time:  11/20/2016 7:02 PM   Critical care time was exclusive of:  Separately billable procedures and treating other patients   Critical care was necessary to treat or prevent imminent or life-threatening deterioration of the following conditions:  Cardiac failure   Critical care was time spent personally by me on the following activities:  Blood draw for specimens, discussions with consultants, evaluation of patient's response to treatment, examination of patient, obtaining history from patient or surrogate, ordering and performing treatments and interventions, ordering and review of laboratory studies, ordering and review of radiographic studies, pulse oximetry, re-evaluation of patient's condition and review of old charts     (including critical care time)  Medications Ordered in ED Medications  sodium chloride 0.9 % bolus 500 mL (0 mLs Intravenous Stopped 11/20/16 2227)  diltiazem (CARDIZEM) 1 mg/mL load via infusion 10 mg (10 mg Intravenous Bolus from Bag 11/20/16 1941)  diphenhydrAMINE (BENADRYL) capsule 25 mg (25 mg Oral Given 11/20/16 2226)  aspirin chewable tablet 81 mg (81 mg Oral Given 11/22/16 0617)  diphenhydrAMINE (BENADRYL) 25 mg capsule (  Duplicate 74/08/14 4818)     Initial Impression / Assessment and Plan / ED Course  I have reviewed the triage vital signs and the nursing notes.  Pertinent labs & imaging results that were available during my care of the patient were  reviewed by me and considered in my medical decision making (see chart for details).  Clinical Course as of Dec 14 2136  Mon Nov 20, 2016  1859 Sodium: 135 [EW]  1859 Normal  [EW]  1859 Chloride: (!) 92 [EW]  1859 High consistent with end-stage renal disease Creatinine: (!) 6.60 [EW]  1859 Normal Potassium: 3.5 [EW]  1859 Normal Troponin i, poc: 0.06 [EW]  1859 Low Hemoglobin: (!) 11.0 [EW]  1900 Mild pulmonary edema DG Chest 2 View [EW]  1900 Persistent tachycardia, heart rate now 138, with normal blood pressure.  Will start Cardizem drip, and arrange for admission.  [EW]    Clinical Course User Index [EW] Daleen Bo, MD     No data found.  7:01 PM Reevaluation with update and discussion. After initial assessment and treatment, an updated evaluation reveals patient alert cooperative, hungry and asking for food.  Patient updated on findings and plan. Daleen Bo   8:14 PM-Consult complete with hospitalist. Patient case explained and discussed.  He agrees to admit patient for further evaluation and treatment. Call ended at 20: 39   Final Clinical Impressions(s) / ED Diagnoses   Final diagnoses:  Atrial fibrillation with rapid ventricular response ([place])  ESRD (end stage renal disease) ([place])    Nonspecific chest discomfort with new onset atrial fibrillation and rapid ventricular rate.  End-stage renal disease with normal potassium, and mild fluid overload on imaging.  Oxygenation and mentation are normal.  Doubt ACS, or significant electrolyte abnormality at this time.  Onset of atrial fibrillation, likely 3 days ago when she began feeling weak.  Therefore the patient is not a candidate, for cardioversion in the emergency department.  She is hemodynamically stable and will require admission, monitoring, and cardiology consultation.  Nursing Notes Reviewed/ Care Coordinated Applicable Imaging Reviewed Interpretation of Laboratory Data  incorporated into ED treatment  Plan:  [place]  ED Discharge Orders        Ordered    diltiazem (CARDIZEM CD) 120 MG 24 hr capsule  Daily     11/23/16 1135    warfarin (COUMADIN) 5 MG tablet  One time only - 1800     11/23/16 1135    Increase activity slowly     11/23/16 1135    Diet - low sodium heart healthy     11/23/16 1135    Call MD for:  temperature >100.4     11/23/16 1135    Call MD for:  persistant nausea and vomiting     11/23/16 1135    Call MD for:  severe uncontrolled pain     11/23/16 1135    Call MD for:  redness, tenderness, or signs of infection (pain, swelling, redness, odor or green/yellow discharge around incision site)     11/23/16 1135    Call MD for:  difficulty breathing, headache or visual disturbances     11/23/16 1135    Call MD for:  hives     11/23/16 1135    Call MD for:  extreme fatigue     11/23/16 1135    Call MD for:  persistant dizziness or light-headedness     11/23/16 1135       Daleen Bo, MD 11/20/16 2037    Daleen Bo, MD 12/14/16 2138

## 2016-11-20 NOTE — ED Notes (Signed)
Renal meal tray ordered for pt at this time

## 2016-11-21 ENCOUNTER — Other Ambulatory Visit: Payer: Self-pay

## 2016-11-21 ENCOUNTER — Encounter (HOSPITAL_COMMUNITY): Payer: Self-pay | Admitting: General Practice

## 2016-11-21 DIAGNOSIS — E785 Hyperlipidemia, unspecified: Secondary | ICD-10-CM | POA: Diagnosis present

## 2016-11-21 DIAGNOSIS — R569 Unspecified convulsions: Secondary | ICD-10-CM | POA: Diagnosis not present

## 2016-11-21 DIAGNOSIS — R402413 Glasgow coma scale score 13-15, at hospital admission: Secondary | ICD-10-CM | POA: Diagnosis present

## 2016-11-21 DIAGNOSIS — I4891 Unspecified atrial fibrillation: Secondary | ICD-10-CM

## 2016-11-21 DIAGNOSIS — F17210 Nicotine dependence, cigarettes, uncomplicated: Secondary | ICD-10-CM | POA: Diagnosis present

## 2016-11-21 DIAGNOSIS — Z992 Dependence on renal dialysis: Secondary | ICD-10-CM | POA: Diagnosis not present

## 2016-11-21 DIAGNOSIS — F17200 Nicotine dependence, unspecified, uncomplicated: Secondary | ICD-10-CM

## 2016-11-21 DIAGNOSIS — K219 Gastro-esophageal reflux disease without esophagitis: Secondary | ICD-10-CM | POA: Diagnosis present

## 2016-11-21 DIAGNOSIS — I214 Non-ST elevation (NSTEMI) myocardial infarction: Secondary | ICD-10-CM | POA: Diagnosis not present

## 2016-11-21 DIAGNOSIS — I132 Hypertensive heart and chronic kidney disease with heart failure and with stage 5 chronic kidney disease, or end stage renal disease: Secondary | ICD-10-CM | POA: Diagnosis not present

## 2016-11-21 DIAGNOSIS — E8889 Other specified metabolic disorders: Secondary | ICD-10-CM | POA: Diagnosis not present

## 2016-11-21 DIAGNOSIS — Z8673 Personal history of transient ischemic attack (TIA), and cerebral infarction without residual deficits: Secondary | ICD-10-CM | POA: Diagnosis not present

## 2016-11-21 DIAGNOSIS — I120 Hypertensive chronic kidney disease with stage 5 chronic kidney disease or end stage renal disease: Secondary | ICD-10-CM | POA: Diagnosis not present

## 2016-11-21 DIAGNOSIS — E039 Hypothyroidism, unspecified: Secondary | ICD-10-CM | POA: Diagnosis present

## 2016-11-21 DIAGNOSIS — I10 Essential (primary) hypertension: Secondary | ICD-10-CM | POA: Diagnosis not present

## 2016-11-21 DIAGNOSIS — N2581 Secondary hyperparathyroidism of renal origin: Secondary | ICD-10-CM | POA: Diagnosis not present

## 2016-11-21 DIAGNOSIS — M940 Chondrocostal junction syndrome [Tietze]: Secondary | ICD-10-CM | POA: Diagnosis present

## 2016-11-21 DIAGNOSIS — R7611 Nonspecific reaction to tuberculin skin test without active tuberculosis: Secondary | ICD-10-CM | POA: Diagnosis present

## 2016-11-21 DIAGNOSIS — I272 Pulmonary hypertension, unspecified: Secondary | ICD-10-CM | POA: Diagnosis not present

## 2016-11-21 DIAGNOSIS — Z905 Acquired absence of kidney: Secondary | ICD-10-CM | POA: Diagnosis not present

## 2016-11-21 DIAGNOSIS — R7989 Other specified abnormal findings of blood chemistry: Secondary | ICD-10-CM | POA: Diagnosis present

## 2016-11-21 DIAGNOSIS — G4730 Sleep apnea, unspecified: Secondary | ICD-10-CM | POA: Diagnosis present

## 2016-11-21 DIAGNOSIS — D631 Anemia in chronic kidney disease: Secondary | ICD-10-CM | POA: Diagnosis present

## 2016-11-21 DIAGNOSIS — I361 Nonrheumatic tricuspid (valve) insufficiency: Secondary | ICD-10-CM | POA: Diagnosis not present

## 2016-11-21 DIAGNOSIS — G40909 Epilepsy, unspecified, not intractable, without status epilepticus: Secondary | ICD-10-CM | POA: Diagnosis not present

## 2016-11-21 DIAGNOSIS — I739 Peripheral vascular disease, unspecified: Secondary | ICD-10-CM

## 2016-11-21 DIAGNOSIS — N186 End stage renal disease: Secondary | ICD-10-CM | POA: Diagnosis not present

## 2016-11-21 DIAGNOSIS — I25119 Atherosclerotic heart disease of native coronary artery with unspecified angina pectoris: Secondary | ICD-10-CM | POA: Diagnosis not present

## 2016-11-21 DIAGNOSIS — I5042 Chronic combined systolic (congestive) and diastolic (congestive) heart failure: Secondary | ICD-10-CM | POA: Diagnosis present

## 2016-11-21 LAB — BASIC METABOLIC PANEL
Anion gap: 10 (ref 5–15)
BUN: 14 mg/dL (ref 6–20)
CHLORIDE: 95 mmol/L — AB (ref 101–111)
CO2: 32 mmol/L (ref 22–32)
Calcium: 9.1 mg/dL (ref 8.9–10.3)
Creatinine, Ser: 7.58 mg/dL — ABNORMAL HIGH (ref 0.44–1.00)
GFR calc Af Amer: 5 mL/min — ABNORMAL LOW (ref >60)
GFR calc non Af Amer: 5 mL/min — ABNORMAL LOW (ref >60)
GLUCOSE: 84 mg/dL (ref 65–99)
POTASSIUM: 3.5 mmol/L (ref 3.5–5.1)
Sodium: 137 mmol/L (ref 135–145)

## 2016-11-21 LAB — RENAL FUNCTION PANEL
Albumin: 2.9 g/dL — ABNORMAL LOW (ref 3.5–5.0)
Albumin: 2.9 g/dL — ABNORMAL LOW (ref 3.5–5.0)
Anion gap: 13 (ref 5–15)
Anion gap: 11 (ref 5–15)
BUN: 13 mg/dL (ref 6–20)
BUN: 18 mg/dL (ref 6–20)
CO2: 28 mmol/L (ref 22–32)
CO2: 28 mmol/L (ref 22–32)
Calcium: 9 mg/dL (ref 8.9–10.3)
Calcium: 9.1 mg/dL (ref 8.9–10.3)
Chloride: 95 mmol/L — ABNORMAL LOW (ref 101–111)
Chloride: 95 mmol/L — ABNORMAL LOW (ref 101–111)
Creatinine, Ser: 7.57 mg/dL — ABNORMAL HIGH (ref 0.44–1.00)
Creatinine, Ser: 8.29 mg/dL — ABNORMAL HIGH (ref 0.44–1.00)
GFR calc Af Amer: 6 mL/min — ABNORMAL LOW (ref >60)
GFR calc Af Amer: 5 mL/min — ABNORMAL LOW (ref >60)
GFR calc non Af Amer: 5 mL/min — ABNORMAL LOW (ref >60)
GFR calc non Af Amer: 4 mL/min — ABNORMAL LOW (ref >60)
Glucose, Bld: 80 mg/dL (ref 65–99)
Glucose, Bld: 105 mg/dL — ABNORMAL HIGH (ref 65–99)
Phosphorus: 4.7 mg/dL — ABNORMAL HIGH (ref 2.5–4.6)
Phosphorus: 4.9 mg/dL — ABNORMAL HIGH (ref 2.5–4.6)
Potassium: 3.6 mmol/L (ref 3.5–5.1)
Potassium: 3.9 mmol/L (ref 3.5–5.1)
Sodium: 136 mmol/L (ref 135–145)
Sodium: 134 mmol/L — ABNORMAL LOW (ref 135–145)

## 2016-11-21 LAB — APTT
APTT: 60 s — AB (ref 24–36)
aPTT: 63 seconds — ABNORMAL HIGH (ref 24–36)
aPTT: 62 seconds — ABNORMAL HIGH (ref 24–36)

## 2016-11-21 LAB — CBC
HCT: 35.1 % — ABNORMAL LOW (ref 36.0–46.0)
HEMATOCRIT: 33.1 % — AB (ref 36.0–46.0)
HEMOGLOBIN: 10.4 g/dL — AB (ref 12.0–15.0)
Hemoglobin: 11 g/dL — ABNORMAL LOW (ref 12.0–15.0)
MCH: 25.6 pg — ABNORMAL LOW (ref 26.0–34.0)
MCH: 25.6 pg — ABNORMAL LOW (ref 26.0–34.0)
MCHC: 31.4 g/dL (ref 30.0–36.0)
MCHC: 31.3 g/dL (ref 30.0–36.0)
MCV: 81.3 fL (ref 78.0–100.0)
MCV: 81.6 fL (ref 78.0–100.0)
PLATELETS: 147 10*3/uL — AB (ref 150–400)
Platelets: 138 K/uL — ABNORMAL LOW (ref 150–400)
RBC: 4.07 MIL/uL (ref 3.87–5.11)
RBC: 4.3 MIL/uL (ref 3.87–5.11)
RDW: 17.4 % — ABNORMAL HIGH (ref 11.5–15.5)
RDW: 17.1 % — ABNORMAL HIGH (ref 11.5–15.5)
WBC: 2.9 10*3/uL — AB (ref 4.0–10.5)
WBC: 3.4 K/uL — ABNORMAL LOW (ref 4.0–10.5)

## 2016-11-21 LAB — TROPONIN I
Troponin I: 0.95 ng/mL (ref <0.03)
Troponin I: 2.06 ng/mL (ref <0.03)
Troponin I: 3.12 ng/mL (ref <0.03)

## 2016-11-21 LAB — TSH: TSH: 1.701 u[IU]/mL (ref 0.350–4.500)

## 2016-11-21 MED ORDER — SODIUM CHLORIDE 0.9% FLUSH
3.0000 mL | Freq: Two times a day (BID) | INTRAVENOUS | Status: DC
  Administered 2016-11-22: 3 mL via INTRAVENOUS

## 2016-11-21 MED ORDER — SODIUM CHLORIDE 0.9% FLUSH: 3.0000 mL | INTRAVENOUS | Status: DC | PRN

## 2016-11-21 MED ORDER — PENTAFLUOROPROP-TETRAFLUOROETH EX AERO: 1.0000 "application " | INHALATION_SPRAY | CUTANEOUS | Status: DC | PRN

## 2016-11-21 MED ORDER — CALCITRIOL 0.5 MCG PO CAPS: 2.0000 ug | ORAL_CAPSULE | ORAL | Status: DC

## 2016-11-21 MED ORDER — ASPIRIN 81 MG PO CHEW
81.0000 mg | CHEWABLE_TABLET | ORAL | Status: AC
  Administered 2016-11-22: 81 mg via ORAL
  Filled 2016-11-21: qty 1

## 2016-11-21 MED ORDER — LIDOCAINE HCL (PF) 1 % IJ SOLN: 5.0000 mL | INTRAMUSCULAR | Status: DC | PRN

## 2016-11-21 MED ORDER — NEPRO/CARBSTEADY PO LIQD: 237.0000 mL | Freq: Two times a day (BID) | ORAL | Status: DC

## 2016-11-21 MED ORDER — CINACALCET HCL 30 MG PO TABS: 120.0000 mg | ORAL_TABLET | ORAL | Status: DC

## 2016-11-21 MED ORDER — LIDOCAINE-PRILOCAINE 2.5-2.5 % EX CREA
1.0000 "application " | TOPICAL_CREAM | CUTANEOUS | Status: DC | PRN
  Filled 2016-11-21: qty 5

## 2016-11-21 MED ORDER — SODIUM CHLORIDE 0.9 % IV SOLN
INTRAVENOUS | Status: DC
  Administered 2016-11-22: 06:00:00 via INTRAVENOUS

## 2016-11-21 MED ORDER — DIPHENHYDRAMINE HCL 25 MG PO CAPS
25.0000 mg | ORAL_CAPSULE | Freq: Four times a day (QID) | ORAL | Status: DC | PRN
  Administered 2016-11-21 – 2016-11-23 (×3): 25 mg via ORAL
  Filled 2016-11-21: qty 1

## 2016-11-21 MED ORDER — SODIUM CHLORIDE 0.9 % IV SOLN: 100.0000 mL | INTRAVENOUS | Status: DC | PRN

## 2016-11-21 MED ORDER — SODIUM CHLORIDE 0.9 % IV SOLN: 250.0000 mL | INTRAVENOUS | Status: DC | PRN

## 2016-11-21 MED ORDER — CINACALCET HCL 30 MG PO TABS
120.0000 mg | ORAL_TABLET | ORAL | Status: DC
  Filled 2016-11-21: qty 4

## 2016-11-21 NOTE — Progress Notes (Addendum)
Patient complained of 7/10 chest pain.   1 nitro given  Pain now at 0/10 Sarah Bayley, NP notified.

## 2016-11-21 NOTE — ED Notes (Signed)
Pt requesting benadryl for itching PRN. Admitting MD text paged. Pt reports she takes this on her dialysis days.

## 2016-11-21 NOTE — ED Notes (Signed)
Called daughter Caryl Asp, [phone] per pt request to provide update. Spoke with dialysis, they report patient is not on their list as of yet.

## 2016-11-21 NOTE — Progress Notes (Signed)
[place] for argatroban Indication: atrial fibrillation  Allergies  Allergen Reactions  . Ace Inhibitors Anaphylaxis and Rash  . Heparin Other (See Comments)    MDs told her not to take after reaction in ICU  . Iohexol Swelling and Other (See Comments)    1970s; passed out and had facial/tongue swelling.  Requires 13-hour prep with prednisone and benadryl  . Phenytoin Other (See Comments)    Had reaction while in ICU; doesn't know.  MDs told her not to take ever again.  . Wellbutrin [Bupropion] Other (See Comments)    seizures  . Meperidine Hcl Swelling and Rash    Makes tongue swell  . Morphine Rash  . Penicillins Hives and Rash    Has patient had a PCN reaction causing immediate rash, facial/tongue/throat swelling, SOB or lightheadedness with hypotension: Yes Has patient had a PCN reaction causing severe rash involving mucus membranes or skin necrosis: No Has patient had a PCN reaction that required hospitalization: No Has patient had a PCN reaction occurring within the last 10 years: No If all of the above answers are "NO", then may proceed with Cephalosporin use.    . Valproic Acid And Related Other (See Comments)    Confusion   . Iodinated Diagnostic Agents Other (See Comments)    Pre-meditate with benadryl and prednisone 3 times before appt.  . Pentazocine Lactate Other (See Comments)    Patient does not remember reaction to this med (Talwin).     Patient Measurements: Height: 5' 2.5" (158.8 cm) Weight: 171 lb 15.3 oz (78 kg) IBW/kg (Calculated) : 51.25  Vital Signs: Temp: 98.2 F (36.8 C) (11/20 1723) Temp Source: Oral (11/20 1723) BP: 138/61 (11/20 1825) Pulse Rate: 65 (11/20 1825)  Labs: Recent Labs    11/20/16 1653 11/20/16 1703 11/20/16 2246 11/21/16 0325 11/21/16 0747 11/21/16 1247 11/21/16 1412 11/21/16 1825  HGB 11.0* 12.2  --  10.4*  --   --  11.0*  --   HCT 35.2* 36.0  --  33.1*  --   --  35.1*  --    PLT 172  --   --  147*  --   --  138*  --   APTT  --   --   --  63* 60*  --   --  62*  CREATININE 6.73* 6.60*  --  7.57*  7.58*  --   --  8.29*  --   TROPONINI  --   --  0.95* 2.06*  --  3.12*  --   --     Estimated Creatinine Clearance: 6 mL/min (A) (by C-G formula based on SCr of 8.29 mg/dL (H)).   Medical History: Past Medical History:  Diagnosis Date  . Anemia    History  of Blood transfusion  . Atrial fibrillation with RVR ([place]) 11/2016  . CAD (coronary artery disease)    stent to RCA  . Cancer ([place])    clear cell cancer, kidney  . Chronic renal insufficiency    On hemodialysis  . Complication of anesthesia 12/2010   pt is very confused, with AMS with anesthesia  . CVA 07/27/2008   CVA affected cognition and memory per family, no focal deficits.    . CVA (cerebral infarction) 2003   no apparent residual  . Diastolic congestive heart failure ([place])   . Dyslipidemia   . Encephalopathy   . ESRD 07/27/2008   ESRD due to HTN and NSAID's, started hemodialysis in 2005 in  [place], [place]. Went to [facility] from 2010 to 2012 and since 2012 has been getting dialysis at [facility] on [address] in [place] on a MWF schedule. First access with RUA AVG placed in [place]. Next and current access was LUA AVG placed by Dr. Lucky Cowboy in [place] in or around 2012. Has had 2 or 3 procedures on that graft since placed per family. She gets her access work done here in [place] now. She is allergic to heparin and does not get any heparin at dialysis; she had an allergic reaction apparently when in ICU in the past.      . GERD (gastroesophageal reflux disease)   . Hyperlipidemia   . Hypertension   . Hypothyroidism   . Positive PPD    completed rifampin  . Pulmonary hypertension ([place])   . PVD (peripheral vascular disease) ([place])   . Seizures ([place])    last seizure 6 years ago  . Sleep apnea    Sleep Study 2008  . Traumatic seroma of left lower leg     Assessment: 71yo female c/o  chest discomfort and weakness, found to be in Afib w/ RVR improved on diltiazem.  Currently on argatroban d/t heparin allergy, drip rate  68mcg/kg/min, aptt 62 sec - at goal.     Goal of Therapy:  aPTT 50-90 seconds Monitor platelets by anticoagulation protocol: Yes    Plan:  Continue argatroban infusion at 1 mcg/kg/min  Check pTT daily Cardiac cath likely tomorrow    Bonnita Nasuti Pharm.D. CPP, BCPS Clinical Pharmacist [phone] 11/21/2016 7:16 PM

## 2016-11-21 NOTE — Progress Notes (Signed)
Pt admitted with chest pain, elevated troponin and new onset atrial fibrillation.  Her rate is now in the 110s on 5 mg/hour of Cardizem She is being admitted by the hospitalist service.  See full note by cardiology fellow on call this am  Her chest pain is concerning for angina and with elevated troponin, have to exclude progression of CAD. No chest pain currently. EKG without ischemic changes. Will attempt better rate control today. HD today. Will plan cardiac cath tomorrow. Cath orders placed. Pt will be NPO at midnight.   [name] 11/21/2016 8:28 AM

## 2016-11-21 NOTE — Consult Note (Signed)
[facility] Renal Consultation Note    Indication for Consultation:  Management of ESRD/hemodialysis; anemia, hypertension/volume and secondary hyperparathyroidism PCP:  HPI: [name] is a 71 y.o. female with ESRD on hemodialysis MWF at [facility]. PMH of CAD, diastolic HF, pulmonary hypertension, PVD, CVA, dyslipidemia,hypothyroidism, + PPD (Rx with Rifampin), kidney cancer, AOCD, SHPT.   Last HD 11/19/16-ran full treatment-left 0.7 under OP EDW.   Patient presented to ED 11/20/2016 with C/O chest pain she believed to be costochondritis. She says the pain actually started on Friday 11/17/2016 on the [facility] bus during which she took 2 NTGs SL with relief but chest pain returned yesterday which was NOT relieved with NTG.  She called EMS for transport to ED for evaluation. She described chest pain as substernal pressure 8/10. She denies chest pain at present. Upon arrival to ED she was found to be in AFIB RVR per EKG without ischemic changes. She was started on cardizem drip. Currently she remains in Afib with ventricular response in 70s. Troponin peak 2.06, K+ 3.6 Ca 9.3 CO2 27 HGB 11 WBC 3.2 PLT 172. Cardiology has been consulted. She has been started on argatroban IV and plans are in place for cardiac cath tomorrow. She is resting comfortably in ED, says she has had no other issues. Is concern about getting her HD treatment only-say she has had cardiac caths before. Has had this CP since Friday- sometimes relieved with ntg sometimes not- right now HR is controlled   Past Medical History:  Diagnosis Date  . Anemia    History  of Blood transfusion  . CAD (coronary artery disease)    stent to RCA  . Cancer ([place])    clear cell cancer, kidney  . Chronic renal insufficiency    On hemodialysis  . Complication of anesthesia 12/2010   pt is very confused, with AMS with anesthesia  . CVA 07/27/2008   CVA affected cognition and memory per family, no focal  deficits.    . CVA (cerebral infarction) 2003   no apparent residual  . Diastolic congestive heart failure ([place])   . Dyslipidemia   . Encephalopathy   . ESRD 07/27/2008   ESRD due to HTN and NSAID's, started hemodialysis in 2005 in [place], [place]. Went to [facility] from 2010 to 2012 and since 2012 has been getting dialysis at [facility] on [address] in [place] on a MWF schedule. First access with RUA AVG placed in [place]. Next and current access was LUA AVG placed by Dr. Lucky Cowboy in [place] in or around 2012. Has had 2 or 3 procedures on that graft since placed per family. She gets her access work done here in [place] now. She is allergic to heparin and does not get any heparin at dialysis; she had an allergic reaction apparently when in ICU in the past.      . GERD (gastroesophageal reflux disease)   . Hyperlipidemia   . Hypertension   . Hypothyroidism   . Positive PPD    completed rifampin  . Pulmonary hypertension ([place])   . PVD (peripheral vascular disease) ([place])   . Seizures ([place])    last seizure 6 years ago  . Sleep apnea    Sleep Study 2008  . Traumatic seroma of left lower leg    Past Surgical History:  Procedure Laterality Date  . ABDOMINAL AORTOGRAM W/LOWER EXTREMITY Bilateral 06/15/2016   Procedure: Abdominal Aortogram w/Lower Extremity;  Surgeon: Conrad Tracyton, MD;  Location: [facility]  INVASIVE CV LAB;  Service: Cardiovascular;  Laterality: Bilateral;  . ABDOMINAL HYSTERECTOMY    . APPENDECTOMY    . APPLICATION OF WOUND VAC Left 09/12/2016   Procedure: APPLICATION OF WOUND VAC;  Surgeon: Conrad Long Lake, MD;  Location: [facility];  Service: Vascular;  Laterality: Left;  . ARTERY EXPLORATION Left 07/25/2016   Procedure: ARTERY EXPLORATION LEFT ABOVE KNEE POPLITEAL AND GROIN;  Surgeon: Conrad Indian Lake, MD;  Location: [facility];  Service: Vascular;  Laterality: Left;  . CARDIAC CATHETERIZATION     last 2016  . CHOLECYSTECTOMY    . D&Cs    . HEMATOMA EVACUATION Left 09/12/2016    Procedure: EVACUATION HEMATOMA;  Surgeon: Conrad Ridgway, MD;  Location: [facility];  Service: Vascular;  Laterality: Left;  . LEFT HEART CATHETERIZATION WITH CORONARY ANGIOGRAM N/A 02/22/2011   Procedure: LEFT HEART CATHETERIZATION WITH CORONARY ANGIOGRAM;  Surgeon: Wellington Hampshire, MD;  Location: [facility] CATH LAB;  Service: Cardiovascular;  Laterality: N/A;  . left nephrectomy    . NEPHRECTOMY Left    Malignant tumor  . PERIPHERAL VASCULAR CATHETERIZATION N/A 12/31/2014   Procedure: Abdominal Aortogram;  Surgeon: Conrad Beemer, MD;  Location: [facility] CV LAB;  Service: Cardiovascular;  Laterality: N/A;  . right ankle repair    . RIGHT HEART CATHETERIZATION N/A 01/29/2014   Procedure: RIGHT HEART CATH;  Surgeon: Larey Dresser, MD;  Location: [facility] CATH LAB;  Service: Cardiovascular;  Laterality: N/A;  . TONSILLECTOMY    . VASCULAR SURGERY  11/2010   graft inserted to left arm   Family History  Problem Relation Age of Onset  . Heart disease Father   . Hypertension Mother   . Dementia Mother   . Coronary artery disease Sister   . Heart attack Sister   . Hypertension Brother    Social History:  reports that she has been smoking cigarettes.  She has been smoking about 0.00 packs per day for the past 53.00 years. she has never used smokeless tobacco. She reports that she drinks alcohol. She reports that she does not use drugs. Allergies  Allergen Reactions  . Ace Inhibitors Anaphylaxis and Rash  . Heparin Other (See Comments)    MDs told her not to take after reaction in ICU  . Iohexol Swelling and Other (See Comments)    1970s; passed out and had facial/tongue swelling.  Requires 13-hour prep with prednisone and benadryl  . Phenytoin Other (See Comments)    Had reaction while in ICU; doesn't know.  MDs told her not to take ever again.  . Wellbutrin [Bupropion] Other (See Comments)    seizures  . Meperidine Hcl Swelling and Rash    Makes tongue swell  . Morphine Rash  . Penicillins Hives and  Rash    Has patient had a PCN reaction causing immediate rash, facial/tongue/throat swelling, SOB or lightheadedness with hypotension: Yes Has patient had a PCN reaction causing severe rash involving mucus membranes or skin necrosis: No Has patient had a PCN reaction that required hospitalization: No Has patient had a PCN reaction occurring within the last 10 years: No If all of the above answers are "NO", then may proceed with Cephalosporin use.    . Valproic Acid And Related Other (See Comments)    Confusion   . Iodinated Diagnostic Agents Other (See Comments)    Pre-meditate with benadryl and prednisone 3 times before appt.  . Pentazocine Lactate Other (See Comments)    Patient does not remember reaction to  this med (Talwin).    Prior to Admission medications   Medication Sig Start Date End Date Taking? Authorizing Provider  acetaminophen (TYLENOL) 500 MG tablet Take 1,000 mg by mouth daily as needed for mild pain.    Yes [provider]  amLODipine (NORVASC) 10 MG tablet Take 10 mg by mouth at bedtime.    Yes [provider]  aspirin EC 81 MG tablet Take 81 mg by mouth daily.   Yes [provider]  carvedilol (COREG) 25 MG tablet Take 25 mg by mouth 2 (two) times daily with a meal.   Yes [provider]  cilostazol (PLETAL) 100 MG tablet Take 1 tablet (100 mg total) by mouth 2 (two) times daily before a meal. 05/24/16  Yes Conrad [facility] [facility], MD  diphenhydrAMINE (BENADRYL) 50 MG capsule Take 1 capsule (50 mg total) by mouth every Monday, Wednesday, and Friday. On dialysis days 09/15/16  Yes Rhyne, Samantha J, PA-C  ferric citrate (AURYXIA) 1 GM 210 MG(Fe) tablet Take 420 mg by mouth 3 (three) times daily with meals. And 1 tablet with snacks   Yes [provider]  folic acid-vitamin b complex-vitamin c-selenium-zinc (DIALYVITE) 3 MG TABS tablet Take 1 tablet by mouth daily.   Yes [provider]  hydrALAZINE (APRESOLINE) 25 MG tablet Take  25 mg 2 (two) times daily by mouth.    Yes [provider]  isosorbide dinitrate (ISORDIL) 20 MG tablet Take 1 tablet (20 mg total) by mouth 3 (three) times daily. 10/26/16  Yes Hochrein, Jeneen Rinks, MD  LETAIRIS 5 MG tablet TAKE 1 TABLET (5MG ) BY MOUTH DAILY. DO NOT HANDLE IF PREGNANT. DO NOTSPLIT, CRUSH, OR CHEW. AVOID INHALATION AND CONTACT WITH SKIN OR EYES. 09/18/16  Yes Juanito Doom, MD  levETIRAcetam (KEPPRA) 500 MG tablet Take 500 mg by mouth daily at 8 pm.   Yes [provider]  levothyroxine (SYNTHROID, LEVOTHROID) 150 MCG tablet Take 150 mcg by mouth daily before breakfast.   Yes [provider]  nitroGLYCERIN (NITROSTAT) 0.4 MG SL tablet Place 1 tablet (0.4 mg total) under the tongue every 5 (five) minutes as needed for chest pain. 10/26/16  Yes Minus Breeding, MD  omeprazole (PRILOSEC OTC) 20 MG tablet Take 20 mg by mouth daily.   Yes [provider]  oxyCODONE (ROXICODONE) 5 MG immediate release tablet Take 1 tablet (5 mg total) by mouth every 6 (six) hours as needed for severe pain. 09/14/16  Yes Rhyne, Samantha J, PA-C  pravastatin (PRAVACHOL) 40 MG tablet Take 1 tablet (40 mg total) by mouth at bedtime. 05/23/16  Yes Minus Breeding, MD  SENSIPAR 90 MG tablet Take 90 mg by mouth every Monday, Wednesday, and Friday with hemodialysis.  03/05/14  Yes [provider]  traMADol (ULTRAM) 50 MG tablet Take 1 tablet (50 mg total) by mouth every 12 (twelve) hours as needed for moderate pain. 07/26/16  Yes Rhyne, Hulen Shouts, PA-C  traZODone (DESYREL) 50 MG tablet Take 50 mg by mouth at bedtime as needed for sleep.    Yes [provider]   Current Facility-Administered Medications  Medication Dose Route Frequency Provider Last Rate Last Dose  . acetaminophen (TYLENOL) tablet 650 mg  650 mg Oral Q6H PRN Rise Patience, MD   650 mg at 11/21/16 8119   Or  . acetaminophen (TYLENOL) suppository 650 mg  650 mg Rectal Q6H PRN Rise Patience, MD      . ambrisentan (LETAIRIS) tablet 5 mg  5  mg Oral Daily Rise Patience, MD      . amLODipine (NORVASC) tablet 10 mg  10 mg Oral QHS Rise Patience, MD      . argatroban 1 mg/mL infusion  1 mcg/kg/min Intravenous Continuous Laren Everts, RPH 4.7 mL/hr at 11/21/16 0855 1 mcg/kg/min at 11/21/16 0855  . carvedilol (COREG) tablet 25 mg  25 mg Oral BID WC Rise Patience, MD   25 mg at 11/21/16 0755  . cilostazol (PLETAL) tablet 100 mg  100 mg Oral BID AC Rise Patience, MD   100 mg at 11/21/16 0837  . [START ON 11/22/2016] cinacalcet (SENSIPAR) tablet 90 mg  90 mg Oral Q M,W,F-HD Rise Patience, MD      . diltiazem (CARDIZEM) 100 mg in dextrose 5 % 100 mL (1 mg/mL) infusion  5-15 mg/hr Intravenous Continuous Daleen Bo, MD 5 mL/hr at 11/21/16 1123 5 mg/hr at 11/21/16 1123  . diphenhydrAMINE (BENADRYL) capsule 25 mg  25 mg Oral Q6H PRN Rai, Ripudeep K, MD   25 mg at 11/21/16 1114  . [START ON 11/22/2016] diphenhydrAMINE (BENADRYL) capsule 50 mg  50 mg Oral Q M,W,F Rise Patience, MD      . ferric citrate (AURYXIA) tablet 420 mg  420 mg Oral TID WC Rise Patience, MD   420 mg at 11/21/16 0837  . folic acid-vitamin b complex-vitamin c-selenium-zinc (DIALYVITE) tablet 1 tablet  1 tablet Oral Daily Rise Patience, MD      . hydrALAZINE (APRESOLINE) tablet 25 mg  25 mg Oral BID Rise Patience, MD   25 mg at 11/20/16 2326  . isosorbide dinitrate (ISORDIL) tablet 20 mg  20 mg Oral TID Rise Patience, MD   20 mg at 11/21/16 1045  . levETIRAcetam (KEPPRA) tablet 500 mg  500 mg Oral Q2000 Rise Patience, MD   500 mg at 11/20/16 2325  . levothyroxine (SYNTHROID, LEVOTHROID) tablet 150 mcg  150 mcg Oral QAC breakfast Rise Patience, MD      . nitroGLYCERIN (NITROSTAT) SL tablet 0.4 mg  0.4 mg Sublingual Q5 min PRN Rise Patience, MD   0.4 mg at 11/21/16 5397  . ondansetron (ZOFRAN) tablet 4 mg  4 mg Oral Q6H PRN Rise Patience, MD       Or  . ondansetron [facility]) injection 4 mg  4 mg Intravenous Q6H PRN Rise Patience, MD      . oxyCODONE (Oxy IR/ROXICODONE) immediate release tablet 5 mg  5 mg Oral Q6H PRN Rise Patience, MD   5 mg at 11/21/16 0755  . pantoprazole (PROTONIX) EC tablet 40 mg  40 mg Oral Daily Rise Patience, MD   40 mg at 11/21/16 1045  . pravastatin (PRAVACHOL) tablet 40 mg  40 mg Oral QHS Rise Patience, MD   40 mg at 11/20/16 2326  . traZODone (DESYREL) tablet 50 mg  50 mg Oral QHS PRN Rise Patience, MD   50 mg at 11/21/16 0025   Current Outpatient Medications  Medication Sig Dispense Refill  . acetaminophen (TYLENOL) 500 MG tablet Take 1,000 mg by mouth daily as needed for mild pain.     [name] amLODipine (NORVASC) 10 MG tablet Take 10 mg by mouth at bedtime.     [name] aspirin EC 81 MG tablet Take 81 mg by mouth daily.    . carvedilol (COREG) 25 MG tablet Take 25 mg by mouth 2 (two) times daily with  a meal.    . cilostazol (PLETAL) 100 MG tablet Take 1 tablet (100 mg total) by mouth 2 (two) times daily before a meal. 60 tablet 11  . diphenhydrAMINE (BENADRYL) 50 MG capsule Take 1 capsule (50 mg total) by mouth every Monday, Wednesday, and Friday. On dialysis days    . ferric citrate (AURYXIA) 1 GM 210 MG(Fe) tablet Take 420 mg by mouth 3 (three) times daily with meals. And 1 tablet with snacks    . folic acid-vitamin b complex-vitamin c-selenium-zinc (DIALYVITE) 3 MG TABS tablet Take 1 tablet by mouth daily.    . hydrALAZINE (APRESOLINE) 25 MG tablet Take 25 mg 2 (two) times daily by mouth.     . isosorbide dinitrate (ISORDIL) 20 MG tablet Take 1 tablet (20 mg total) by mouth 3 (three) times daily. 270 tablet 3  . LETAIRIS 5 MG tablet TAKE 1 TABLET (5MG ) BY MOUTH DAILY. DO NOT HANDLE IF PREGNANT. DO NOTSPLIT, CRUSH, OR CHEW. AVOID INHALATION AND CONTACT WITH SKIN OR EYES. 30 tablet 11  . levETIRAcetam (KEPPRA) 500 MG tablet Take 500 mg by mouth daily at 8 pm.    .  levothyroxine (SYNTHROID, LEVOTHROID) 150 MCG tablet Take 150 mcg by mouth daily before breakfast.    . nitroGLYCERIN (NITROSTAT) 0.4 MG SL tablet Place 1 tablet (0.4 mg total) under the tongue every 5 (five) minutes as needed for chest pain. 25 tablet 1  . omeprazole (PRILOSEC OTC) 20 MG tablet Take 20 mg by mouth daily.    [name] oxyCODONE (ROXICODONE) 5 MG immediate release tablet Take 1 tablet (5 mg total) by mouth every 6 (six) hours as needed for severe pain. 30 tablet 0  . pravastatin (PRAVACHOL) 40 MG tablet Take 1 tablet (40 mg total) by mouth at bedtime. 90 tablet 3  . SENSIPAR 90 MG tablet Take 90 mg by mouth every Monday, Wednesday, and Friday with hemodialysis.     [name] traMADol (ULTRAM) 50 MG tablet Take 1 tablet (50 mg total) by mouth every 12 (twelve) hours as needed for moderate pain. 20 tablet 0  . traZODone (DESYREL) 50 MG tablet Take 50 mg by mouth at bedtime as needed for sleep.      Labs: Basic Metabolic Panel: Recent Labs  Lab 11/20/16 1653 11/20/16 1703 11/21/16 0325  NA 136 135 137  K 3.6 3.5 3.5  CL 95* 92* 95*  CO2 27  --  32  GLUCOSE 90 94 84  BUN 8 10 14   CREATININE 6.73* 6.60* 7.58*  CALCIUM 9.3  --  9.1   CBC: Recent Labs  Lab 11/20/16 1653 11/20/16 1703 11/21/16 0325  WBC 3.2*  --  2.9*  HGB 11.0* 12.2 10.4*  HCT 35.2* 36.0 33.1*  MCV 81.9  --  81.3  PLT 172  --  147*   Cardiac Enzymes: Recent Labs  Lab 11/20/16 2246 11/21/16 0325  TROPONINI 0.95* 2.06*    Studies/Results: Dg Chest 2 View  Result Date: 11/20/2016 CLINICAL DATA:  Chest pain for 1 day. EXAM: CHEST  2 VIEW COMPARISON:  09/03/2014 and older exams. FINDINGS: Cardiac silhouette is mildly enlarged. No mediastinal or hilar masses or evidence of adenopathy. There are thickened interstitial markings bilaterally similar to the most recent prior study. No lung consolidation. There are small bilateral pleural effusions. No pneumothorax. Skeletal structures are grossly intact. IMPRESSION: 1.  Cardiomegaly, interstitial thickening and small pleural effusions. Findings consistent with mild congestive heart failure with interstitial edema. Electronically Signed   By: Shanon Brow  Ormond M.D.   On: 11/20/2016 18:06    ROS: As per HPI otherwise negative.   Physical Exam: Vitals:   11/21/16 1045 11/21/16 1100 11/21/16 1121 11/21/16 1130  BP: 95/64 112/86 (!) 91/46 (!) 101/59  Pulse: 73 69 68 67  Resp: (!) 23 19 (!) 21 17  SpO2: 100% 97% 99% 95%  Weight:      Height:         General: Well developed, well nourished, in no acute distress. Head: Normocephalic, atraumatic, sclera non-icteric, mucus membranes are moist Neck: Supple. JVD not elevated. Lungs: Clear bilaterally to auscultation without wheezes, rales, or rhonchi. Breathing is unlabored. Heart: regularly irregular, no M/R/G Abdomen: Soft, non-tender, non-distended with normoactive bowel sounds. No rebound/guarding. No obvious abdominal masses. M-S:  Strength and tone appear normal for age. Lower extremities:without edema or ischemic changes, no open wounds  Neuro: Alert and oriented X 3. Moves all extremities spontaneously. Psych:  Responds to questions appropriately with a normal affect. Dialysis Access: LUA HERO AVG + bruit  Dialysis Orders: MWF [facility] 4 hrs 180 NRe 450/Auto 1.5 77 kg 2.0 K/2.0 Ca UFP 4 -NO HEPARIN (severe allergy) -Mircera 60 mcg IV q 2 week (last dose 11/15/2016 Last HGB 10.7 11/15/16) -Calcitriol 2 mcg PO TIW (last PTH 333 10/25/16) -Sensipar 120 mg PO TIW  BMD meds: Auryxia 210 mg 2 tabs PO TID (last Ca 10.1 Phos 4.8 11/15/16)  Assessment/Plan: 1.  Chest pain: Troponin peak 2.06 thus far. Cardiology consulted. For cardiac cath tomorrow.   2.  AFib RVR: Currently rate controlled on cardizem gtt. Per primary/Cards. We may need to hold her cardizem during HD for BP reasons 3.  ESRD -  MWF. HD today via AVG on holiday schedule then back to regular schedule Friday.  4.  Hypertension/volume: Currently  BP on soft side. Will need to stop Dilt gtt for HD. Did receive coreg 25 mg this AM. No evidence of volume overload. Left last HD slightly under EDW. Lower EDW to 76.5 kg with HD today.  5.  Anemia  - HGB 10.4 Recent ESA dose as OP. Follow HGB.  6.  Metabolic bone disease -  Continue binders, VDRA and sensipar  7.  Nutrition - renal diet with fld restrictions. Add nepro/renal vit  Rita H. Owens Shark, NP-C 11/21/2016, 12:05 PM  [place] [phone]  Patient seen and examined, agree with above note with above modifications. Feels well right nowe but has had CP since Friday.  HR is controlled on dilt drip at 5 mg.  Will need HD today (holiday schedule) may need to hold dilt during HD but as above HR controlled.  She is hoping her heart cath does not show anything that keeps her in the hospital   Corliss Parish, MD 11/21/2016

## 2016-11-21 NOTE — Progress Notes (Signed)
[place] for argatroban Indication: atrial fibrillation  Allergies  Allergen Reactions  . Ace Inhibitors Anaphylaxis and Rash  . Heparin Other (See Comments)    MDs told her not to take after reaction in ICU  . Iohexol Swelling and Other (See Comments)    1970s; passed out and had facial/tongue swelling.  Requires 13-hour prep with prednisone and benadryl  . Phenytoin Other (See Comments)    Had reaction while in ICU; doesn't know.  MDs told her not to take ever again.  . Wellbutrin [Bupropion] Other (See Comments)    seizures  . Meperidine Hcl Swelling and Rash    Makes tongue swell  . Morphine Rash  . Penicillins Hives and Rash    Has patient had a PCN reaction causing immediate rash, facial/tongue/throat swelling, SOB or lightheadedness with hypotension: Yes Has patient had a PCN reaction causing severe rash involving mucus membranes or skin necrosis: No Has patient had a PCN reaction that required hospitalization: No Has patient had a PCN reaction occurring within the last 10 years: No If all of the above answers are "NO", then may proceed with Cephalosporin use.    . Valproic Acid And Related Other (See Comments)    Confusion   . Iodinated Diagnostic Agents Other (See Comments)    Pre-meditate with benadryl and prednisone 3 times before appt.  . Pentazocine Lactate Other (See Comments)    Patient does not remember reaction to this med (Talwin).     Patient Measurements: Height: 5' 2.5" (158.8 cm) Weight: 172 lb 13.5 oz (78.4 kg) IBW/kg (Calculated) : 51.25  Vital Signs: BP: 120/70 (11/20 0845) Pulse Rate: 114 (11/20 0845)  Labs: Recent Labs    11/20/16 1653 11/20/16 1703 11/20/16 2246 11/21/16 0325 11/21/16 0747  HGB 11.0* 12.2  --  10.4*  --   HCT 35.2* 36.0  --  33.1*  --   PLT 172  --   --  147*  --   APTT  --   --   --  63* 60*  CREATININE 6.73* 6.60*  --  7.58*  --   TROPONINI  --   --  0.95* 2.06*  --      Estimated Creatinine Clearance: 6.6 mL/min (A) (by C-G formula based on SCr of 7.58 mg/dL (H)).   Medical History: Past Medical History:  Diagnosis Date  . Anemia    History  of Blood transfusion  . CAD (coronary artery disease)    stent to RCA  . Cancer ([place])    clear cell cancer, kidney  . Chronic renal insufficiency    On hemodialysis  . Complication of anesthesia 12/2010   pt is very confused, with AMS with anesthesia  . CVA 07/27/2008   CVA affected cognition and memory per family, no focal deficits.    . CVA (cerebral infarction) 2003   no apparent residual  . Diastolic congestive heart failure ([place])   . Dyslipidemia   . Encephalopathy   . ESRD 07/27/2008   ESRD due to HTN and NSAID's, started hemodialysis in 2005 in [place], [place]. Went to [facility] from 2010 to 2012 and since 2012 has been getting dialysis at [facility] on [address] in [place] on a MWF schedule. First access with RUA AVG placed in [place]. Next and current access was LUA AVG placed by Dr. Lucky Cowboy in [place] in or around 2012. Has had 2 or 3 procedures on that graft since placed per family. She gets her  access work done here in [place] now. She is allergic to heparin and does not get any heparin at dialysis; she had an allergic reaction apparently when in ICU in the past.      . GERD (gastroesophageal reflux disease)   . Hyperlipidemia   . Hypertension   . Hypothyroidism   . Positive PPD    completed rifampin  . Pulmonary hypertension ([place])   . PVD (peripheral vascular disease) ([place])   . Seizures ([place])    last seizure 6 years ago  . Sleep apnea    Sleep Study 2008  . Traumatic seroma of left lower leg     Assessment: 71yo female c/o chest discomfort and weakness, found to be in Afib w/ RVR, to begin argatroban 2/2 heparin allergy.  Pt is ESRD, HD schedule is off due to holiday this week, planning to dialyze today and cardiac cath tomorrow.  Goal of Therapy:  aPTT 50-90  seconds Monitor platelets by anticoagulation protocol: Yes    Plan:  Continue argatroban infusion at 1 mcg/kg/min  Check pTT tonight then once daily Cardiac cath likely tomorrow    Harvel Quale  11/21/2016 9:17 AM

## 2016-11-21 NOTE — H&P (View-Only) (Signed)
[facility] Renal Consultation Note    Indication for Consultation:  Management of ESRD/hemodialysis; anemia, hypertension/volume and secondary hyperparathyroidism PCP:  HPI: [name] is a 71 y.o. female with ESRD on hemodialysis MWF at [facility]. PMH of CAD, diastolic HF, pulmonary hypertension, PVD, CVA, dyslipidemia,hypothyroidism, + PPD (Rx with Rifampin), kidney cancer, AOCD, SHPT.   Last HD 11/19/16-ran full treatment-left 0.7 under OP EDW.   Patient presented to ED 11/20/2016 with C/O chest pain she believed to be costochondritis. She says the pain actually started on Friday 11/17/2016 on the [facility] bus during which she took 2 NTGs SL with relief but chest pain returned yesterday which was NOT relieved with NTG.  She called EMS for transport to ED for evaluation. She described chest pain as substernal pressure 8/10. She denies chest pain at present. Upon arrival to ED she was found to be in AFIB RVR per EKG without ischemic changes. She was started on cardizem drip. Currently she remains in Afib with ventricular response in 70s. Troponin peak 2.06, K+ 3.6 Ca 9.3 CO2 27 HGB 11 WBC 3.2 PLT 172. Cardiology has been consulted. She has been started on argatroban IV and plans are in place for cardiac cath tomorrow. She is resting comfortably in ED, says she has had no other issues. Is concern about getting her HD treatment only-say she has had cardiac caths before. Has had this CP since Friday- sometimes relieved with ntg sometimes not- right now HR is controlled   Past Medical History:  Diagnosis Date  . Anemia    History  of Blood transfusion  . CAD (coronary artery disease)    stent to RCA  . Cancer ([place])    clear cell cancer, kidney  . Chronic renal insufficiency    On hemodialysis  . Complication of anesthesia 12/2010   pt is very confused, with AMS with anesthesia  . CVA 07/27/2008   CVA affected cognition and memory per family, no focal  deficits.    . CVA (cerebral infarction) 2003   no apparent residual  . Diastolic congestive heart failure ([place])   . Dyslipidemia   . Encephalopathy   . ESRD 07/27/2008   ESRD due to HTN and NSAID's, started hemodialysis in 2005 in [place], [place]. Went to [facility] from 2010 to 2012 and since 2012 has been getting dialysis at [facility] on [address] in [place] on a MWF schedule. First access with RUA AVG placed in [place]. Next and current access was LUA AVG placed by Dr. Lucky Cowboy in [place] in or around 2012. Has had 2 or 3 procedures on that graft since placed per family. She gets her access work done here in [place] now. She is allergic to heparin and does not get any heparin at dialysis; she had an allergic reaction apparently when in ICU in the past.      . GERD (gastroesophageal reflux disease)   . Hyperlipidemia   . Hypertension   . Hypothyroidism   . Positive PPD    completed rifampin  . Pulmonary hypertension ([place])   . PVD (peripheral vascular disease) ([place])   . Seizures ([place])    last seizure 6 years ago  . Sleep apnea    Sleep Study 2008  . Traumatic seroma of left lower leg    Past Surgical History:  Procedure Laterality Date  . ABDOMINAL AORTOGRAM W/LOWER EXTREMITY Bilateral 06/15/2016   Procedure: Abdominal Aortogram w/Lower Extremity;  Surgeon: Conrad Copperton, MD;  Location: [facility]  INVASIVE CV LAB;  Service: Cardiovascular;  Laterality: Bilateral;  . ABDOMINAL HYSTERECTOMY    . APPENDECTOMY    . APPLICATION OF WOUND VAC Left 09/12/2016   Procedure: APPLICATION OF WOUND VAC;  Surgeon: Conrad Wickliffe, MD;  Location: [place];  Service: Vascular;  Laterality: Left;  . ARTERY EXPLORATION Left 07/25/2016   Procedure: ARTERY EXPLORATION LEFT ABOVE KNEE POPLITEAL AND GROIN;  Surgeon: Conrad San Miguel, MD;  Location: [place];  Service: Vascular;  Laterality: Left;  . CARDIAC CATHETERIZATION     last 2016  . CHOLECYSTECTOMY    . D&Cs    . HEMATOMA EVACUATION Left 09/12/2016    Procedure: EVACUATION HEMATOMA;  Surgeon: Conrad Independence, MD;  Location: [place];  Service: Vascular;  Laterality: Left;  . LEFT HEART CATHETERIZATION WITH CORONARY ANGIOGRAM N/A 02/22/2011   Procedure: LEFT HEART CATHETERIZATION WITH CORONARY ANGIOGRAM;  Surgeon: Wellington Hampshire, MD;  Location: [place] CATH LAB;  Service: Cardiovascular;  Laterality: N/A;  . left nephrectomy    . NEPHRECTOMY Left    Malignant tumor  . PERIPHERAL VASCULAR CATHETERIZATION N/A 12/31/2014   Procedure: Abdominal Aortogram;  Surgeon: Conrad Billings, MD;  Location: [place] CV LAB;  Service: Cardiovascular;  Laterality: N/A;  . right ankle repair    . RIGHT HEART CATHETERIZATION N/A 01/29/2014   Procedure: RIGHT HEART CATH;  Surgeon: Larey Dresser, MD;  Location: [facility] CATH LAB;  Service: Cardiovascular;  Laterality: N/A;  . TONSILLECTOMY    . VASCULAR SURGERY  11/2010   graft inserted to left arm   Family History  Problem Relation Age of Onset  . Heart disease Father   . Hypertension Mother   . Dementia Mother   . Coronary artery disease Sister   . Heart attack Sister   . Hypertension Brother    Social History:  reports that she has been smoking cigarettes.  She has been smoking about 0.00 packs per day for the past 53.00 years. she has never used smokeless tobacco. She reports that she drinks alcohol. She reports that she does not use drugs. Allergies  Allergen Reactions  . Ace Inhibitors Anaphylaxis and Rash  . Heparin Other (See Comments)    MDs told her not to take after reaction in ICU  . Iohexol Swelling and Other (See Comments)    1970s; passed out and had facial/tongue swelling.  Requires 13-hour prep with prednisone and benadryl  . Phenytoin Other (See Comments)    Had reaction while in ICU; doesn't know.  MDs told her not to take ever again.  . Wellbutrin [Bupropion] Other (See Comments)    seizures  . Meperidine Hcl Swelling and Rash    Makes tongue swell  . Morphine Rash  . Penicillins Hives and  Rash    Has patient had a PCN reaction causing immediate rash, facial/tongue/throat swelling, SOB or lightheadedness with hypotension: Yes Has patient had a PCN reaction causing severe rash involving mucus membranes or skin necrosis: No Has patient had a PCN reaction that required hospitalization: No Has patient had a PCN reaction occurring within the last 10 years: No If all of the above answers are "NO", then may proceed with Cephalosporin use.    . Valproic Acid And Related Other (See Comments)    Confusion   . Iodinated Diagnostic Agents Other (See Comments)    Pre-meditate with benadryl and prednisone 3 times before appt.  . Pentazocine Lactate Other (See Comments)    Patient does not remember reaction to  this med (Talwin).    Prior to Admission medications   Medication Sig Start Date End Date Taking? Authorizing Provider  acetaminophen (TYLENOL) 500 MG tablet Take 1,000 mg by mouth daily as needed for mild pain.    Yes [provider]  amLODipine (NORVASC) 10 MG tablet Take 10 mg by mouth at bedtime.    Yes [provider]  aspirin EC 81 MG tablet Take 81 mg by mouth daily.   Yes [provider]  carvedilol (COREG) 25 MG tablet Take 25 mg by mouth 2 (two) times daily with a meal.   Yes [provider]  cilostazol (PLETAL) 100 MG tablet Take 1 tablet (100 mg total) by mouth 2 (two) times daily before a meal. 05/24/16  Yes Conrad Durant, MD  diphenhydrAMINE (BENADRYL) 50 MG capsule Take 1 capsule (50 mg total) by mouth every Monday, Wednesday, and Friday. On dialysis days 09/15/16  Yes Rhyne, Samantha J, PA-C  ferric citrate (AURYXIA) 1 GM 210 MG(Fe) tablet Take 420 mg by mouth 3 (three) times daily with meals. And 1 tablet with snacks   Yes [provider]  folic acid-vitamin b complex-vitamin c-selenium-zinc (DIALYVITE) 3 MG TABS tablet Take 1 tablet by mouth daily.   Yes [provider]  hydrALAZINE (APRESOLINE) 25 MG tablet Take  25 mg 2 (two) times daily by mouth.    Yes [provider]  isosorbide dinitrate (ISORDIL) 20 MG tablet Take 1 tablet (20 mg total) by mouth 3 (three) times daily. 10/26/16  Yes Minus Breeding, MD  LETAIRIS 5 MG tablet TAKE 1 TABLET (5MG ) BY MOUTH DAILY. DO NOT HANDLE IF PREGNANT. DO NOTSPLIT, CRUSH, OR CHEW. AVOID INHALATION AND CONTACT WITH SKIN OR EYES. 09/18/16  Yes Juanito Doom, MD  levETIRAcetam (KEPPRA) 500 MG tablet Take 500 mg by mouth daily at 8 pm.   Yes [provider]  levothyroxine (SYNTHROID, LEVOTHROID) 150 MCG tablet Take 150 mcg by mouth daily before breakfast.   Yes [provider]  nitroGLYCERIN (NITROSTAT) 0.4 MG SL tablet Place 1 tablet (0.4 mg total) under the tongue every 5 (five) minutes as needed for chest pain. 10/26/16  Yes Minus Breeding, MD  omeprazole (PRILOSEC OTC) 20 MG tablet Take 20 mg by mouth daily.   Yes [provider]  oxyCODONE (ROXICODONE) 5 MG immediate release tablet Take 1 tablet (5 mg total) by mouth every 6 (six) hours as needed for severe pain. 09/14/16  Yes Rhyne, Samantha J, PA-C  pravastatin (PRAVACHOL) 40 MG tablet Take 1 tablet (40 mg total) by mouth at bedtime. 05/23/16  Yes Minus Breeding, MD  SENSIPAR 90 MG tablet Take 90 mg by mouth every Monday, Wednesday, and Friday with hemodialysis.  03/05/14  Yes [provider]  traMADol (ULTRAM) 50 MG tablet Take 1 tablet (50 mg total) by mouth every 12 (twelve) hours as needed for moderate pain. 07/26/16  Yes Rhyne, Hulen Shouts, PA-C  traZODone (DESYREL) 50 MG tablet Take 50 mg by mouth at bedtime as needed for sleep.    Yes [provider]   Current Facility-Administered Medications  Medication Dose Route Frequency Provider Last Rate Last Dose  . acetaminophen (TYLENOL) tablet 650 mg  650 mg Oral Q6H PRN Rise Patience, MD   650 mg at 11/21/16 4401   Or  . acetaminophen (TYLENOL) suppository 650 mg  650 mg Rectal Q6H PRN Rise Patience, MD      . ambrisentan (LETAIRIS) tablet 5 mg  5  mg Oral Daily Rise Patience, MD      . amLODipine (NORVASC) tablet 10 mg  10 mg Oral QHS Rise Patience, MD      . argatroban 1 mg/mL infusion  1 mcg/kg/min Intravenous Continuous Laren Everts, RPH 4.7 mL/hr at 11/21/16 0855 1 mcg/kg/min at 11/21/16 0855  . carvedilol (COREG) tablet 25 mg  25 mg Oral BID WC Rise Patience, MD   25 mg at 11/21/16 0755  . cilostazol (PLETAL) tablet 100 mg  100 mg Oral BID AC Rise Patience, MD   100 mg at 11/21/16 0837  . [START ON 11/22/2016] cinacalcet (SENSIPAR) tablet 90 mg  90 mg Oral Q M,W,F-HD Rise Patience, MD      . diltiazem (CARDIZEM) 100 mg in dextrose 5 % 100 mL (1 mg/mL) infusion  5-15 mg/hr Intravenous Continuous Daleen Bo, MD 5 mL/hr at 11/21/16 1123 5 mg/hr at 11/21/16 1123  . diphenhydrAMINE (BENADRYL) capsule 25 mg  25 mg Oral Q6H PRN Rai, Ripudeep K, MD   25 mg at 11/21/16 1114  . [START ON 11/22/2016] diphenhydrAMINE (BENADRYL) capsule 50 mg  50 mg Oral Q M,W,F Rise Patience, MD      . ferric citrate (AURYXIA) tablet 420 mg  420 mg Oral TID WC Rise Patience, MD   420 mg at 11/21/16 0837  . folic acid-vitamin b complex-vitamin c-selenium-zinc (DIALYVITE) tablet 1 tablet  1 tablet Oral Daily Rise Patience, MD      . hydrALAZINE (APRESOLINE) tablet 25 mg  25 mg Oral BID Rise Patience, MD   25 mg at 11/20/16 2326  . isosorbide dinitrate (ISORDIL) tablet 20 mg  20 mg Oral TID Rise Patience, MD   20 mg at 11/21/16 1045  . levETIRAcetam (KEPPRA) tablet 500 mg  500 mg Oral Q2000 Rise Patience, MD   500 mg at 11/20/16 2325  . levothyroxine (SYNTHROID, LEVOTHROID) tablet 150 mcg  150 mcg Oral QAC breakfast Rise Patience, MD      . nitroGLYCERIN (NITROSTAT) SL tablet 0.4 mg  0.4 mg Sublingual Q5 min PRN Rise Patience, MD   0.4 mg at 11/21/16 4008  . ondansetron (ZOFRAN) tablet 4 mg  4 mg Oral Q6H PRN Rise Patience, MD       Or  . ondansetron [facility]) injection 4 mg  4 mg Intravenous Q6H PRN Rise Patience, MD      . oxyCODONE (Oxy IR/ROXICODONE) immediate release tablet 5 mg  5 mg Oral Q6H PRN Rise Patience, MD   5 mg at 11/21/16 0755  . pantoprazole (PROTONIX) EC tablet 40 mg  40 mg Oral Daily Rise Patience, MD   40 mg at 11/21/16 1045  . pravastatin (PRAVACHOL) tablet 40 mg  40 mg Oral QHS Rise Patience, MD   40 mg at 11/20/16 2326  . traZODone (DESYREL) tablet 50 mg  50 mg Oral QHS PRN Rise Patience, MD   50 mg at 11/21/16 0025   Current Outpatient Medications  Medication Sig Dispense Refill  . acetaminophen (TYLENOL) 500 MG tablet Take 1,000 mg by mouth daily as needed for mild pain.     [name] amLODipine (NORVASC) 10 MG tablet Take 10 mg by mouth at bedtime.     [name] aspirin EC 81 MG tablet Take 81 mg by mouth daily.    . carvedilol (COREG) 25 MG tablet Take 25 mg by mouth 2 (two) times daily with  a meal.    . cilostazol (PLETAL) 100 MG tablet Take 1 tablet (100 mg total) by mouth 2 (two) times daily before a meal. 60 tablet 11  . diphenhydrAMINE (BENADRYL) 50 MG capsule Take 1 capsule (50 mg total) by mouth every Monday, Wednesday, and Friday. On dialysis days    . ferric citrate (AURYXIA) 1 GM 210 MG(Fe) tablet Take 420 mg by mouth 3 (three) times daily with meals. And 1 tablet with snacks    . folic acid-vitamin b complex-vitamin c-selenium-zinc (DIALYVITE) 3 MG TABS tablet Take 1 tablet by mouth daily.    . hydrALAZINE (APRESOLINE) 25 MG tablet Take 25 mg 2 (two) times daily by mouth.     . isosorbide dinitrate (ISORDIL) 20 MG tablet Take 1 tablet (20 mg total) by mouth 3 (three) times daily. 270 tablet 3  . LETAIRIS 5 MG tablet TAKE 1 TABLET (5MG ) BY MOUTH DAILY. DO NOT HANDLE IF PREGNANT. DO NOTSPLIT, CRUSH, OR CHEW. AVOID INHALATION AND CONTACT WITH SKIN OR EYES. 30 tablet 11  . levETIRAcetam (KEPPRA) 500 MG tablet Take 500 mg by mouth daily at 8 pm.    .  levothyroxine (SYNTHROID, LEVOTHROID) 150 MCG tablet Take 150 mcg by mouth daily before breakfast.    . nitroGLYCERIN (NITROSTAT) 0.4 MG SL tablet Place 1 tablet (0.4 mg total) under the tongue every 5 (five) minutes as needed for chest pain. 25 tablet 1  . omeprazole (PRILOSEC OTC) 20 MG tablet Take 20 mg by mouth daily.    [name] oxyCODONE (ROXICODONE) 5 MG immediate release tablet Take 1 tablet (5 mg total) by mouth every 6 (six) hours as needed for severe pain. 30 tablet 0  . pravastatin (PRAVACHOL) 40 MG tablet Take 1 tablet (40 mg total) by mouth at bedtime. 90 tablet 3  . SENSIPAR 90 MG tablet Take 90 mg by mouth every Monday, Wednesday, and Friday with hemodialysis.     [name] traMADol (ULTRAM) 50 MG tablet Take 1 tablet (50 mg total) by mouth every 12 (twelve) hours as needed for moderate pain. 20 tablet 0  . traZODone (DESYREL) 50 MG tablet Take 50 mg by mouth at bedtime as needed for sleep.      Labs: Basic Metabolic Panel: Recent Labs  Lab 11/20/16 1653 11/20/16 1703 11/21/16 0325  NA 136 135 137  K 3.6 3.5 3.5  CL 95* 92* 95*  CO2 27  --  32  GLUCOSE 90 94 84  BUN 8 10 14   CREATININE 6.73* 6.60* 7.58*  CALCIUM 9.3  --  9.1   CBC: Recent Labs  Lab 11/20/16 1653 11/20/16 1703 11/21/16 0325  WBC 3.2*  --  2.9*  HGB 11.0* 12.2 10.4*  HCT 35.2* 36.0 33.1*  MCV 81.9  --  81.3  PLT 172  --  147*   Cardiac Enzymes: Recent Labs  Lab 11/20/16 2246 11/21/16 0325  TROPONINI 0.95* 2.06*    Studies/Results: Dg Chest 2 View  Result Date: 11/20/2016 CLINICAL DATA:  Chest pain for 1 day. EXAM: CHEST  2 VIEW COMPARISON:  09/03/2014 and older exams. FINDINGS: Cardiac silhouette is mildly enlarged. No mediastinal or hilar masses or evidence of adenopathy. There are thickened interstitial markings bilaterally similar to the most recent prior study. No lung consolidation. There are small bilateral pleural effusions. No pneumothorax. Skeletal structures are grossly intact. IMPRESSION: 1.  Cardiomegaly, interstitial thickening and small pleural effusions. Findings consistent with mild congestive heart failure with interstitial edema. Electronically Signed   By: Shanon Brow  Ormond M.D.   On: 11/20/2016 18:06    ROS: As per HPI otherwise negative.   Physical Exam: Vitals:   11/21/16 1045 11/21/16 1100 11/21/16 1121 11/21/16 1130  BP: 95/64 112/86 (!) 91/46 (!) 101/59  Pulse: 73 69 68 67  Resp: (!) 23 19 (!) 21 17  SpO2: 100% 97% 99% 95%  Weight:      Height:         General: Well developed, well nourished, in no acute distress. Head: Normocephalic, atraumatic, sclera non-icteric, mucus membranes are moist Neck: Supple. JVD not elevated. Lungs: Clear bilaterally to auscultation without wheezes, rales, or rhonchi. Breathing is unlabored. Heart: regularly irregular, no M/R/G Abdomen: Soft, non-tender, non-distended with normoactive bowel sounds. No rebound/guarding. No obvious abdominal masses. M-S:  Strength and tone appear normal for age. Lower extremities:without edema or ischemic changes, no open wounds  Neuro: Alert and oriented X 3. Moves all extremities spontaneously. Psych:  Responds to questions appropriately with a normal affect. Dialysis Access: LUA HERO AVG + bruit  Dialysis Orders: MWF [facility] 4 hrs 180 NRe 450/Auto 1.5 77 kg 2.0 K/2.0 Ca UFP 4 -NO HEPARIN (severe allergy) -Mircera 60 mcg IV q 2 week (last dose 11/15/2016 Last HGB 10.7 11/15/16) -Calcitriol 2 mcg PO TIW (last PTH 333 10/25/16) -Sensipar 120 mg PO TIW  BMD meds: Auryxia 210 mg 2 tabs PO TID (last Ca 10.1 Phos 4.8 11/15/16)  Assessment/Plan: 1.  Chest pain: Troponin peak 2.06 thus far. Cardiology consulted. For cardiac cath tomorrow.   2.  AFib RVR: Currently rate controlled on cardizem gtt. Per primary/Cards. We may need to hold her cardizem during HD for BP reasons 3.  ESRD -  MWF. HD today via AVG on holiday schedule then back to regular schedule Friday.  4.  Hypertension/volume: Currently  BP on soft side. Will need to stop Dilt gtt for HD. Did receive coreg 25 mg this AM. No evidence of volume overload. Left last HD slightly under EDW. Lower EDW to 76.5 kg with HD today.  5.  Anemia  - HGB 10.4 Recent ESA dose as OP. Follow HGB.  6.  Metabolic bone disease -  Continue binders, VDRA and sensipar  7.  Nutrition - renal diet with fld restrictions. Add nepro/renal vit  Rita H. Owens Shark, NP-C 11/21/2016, 12:05 PM  [place] [phone]  Patient seen and examined, agree with above note with above modifications. Feels well right nowe but has had CP since Friday.  HR is controlled on dilt drip at 5 mg.  Will need HD today (holiday schedule) may need to hold dilt during HD but as above HR controlled.  She is hoping her heart cath does not show anything that keeps her in the hospital   Corliss Parish, MD 11/21/2016

## 2016-11-21 NOTE — Progress Notes (Signed)
[facility]                                                                              Patient Demographics  [name], is a 71 y.o. female, DOB - 09-14-1945, PJA:[id number]  Admit date - 11/20/2016   Admitting Physician No admitting provider for patient encounter.  Outpatient Primary MD for the patient is Glendale Chard, MD  Outpatient specialists:   LOS - 0  days   Medical records reviewed and are as summarized below:    Chief Complaint  Patient presents with  . Chest Pain  . Atrial Fibrillation       Brief summary   Patient is a 71 year old female with CAD, ESRD on hemodialysis MWF, anemia, hypertension, pulmonary hypertension, CVA presented with chest pain.  In the ED, patient was found to have new onset atrial fibrillation with RVR. Trop 2.06, cardiology consulted for NSTEMI   Assessment & Plan    Principal Problem:   Atrial fibrillation with RVR ([place]), new onset -Placed on IV Cardizem, increased to 10 mg/ hour, titrate to heart rate less than 100 -Cardiology consulted, currently on IV argatroban, allergic to heparin products - CHADS 6, will need anticoagulation, deferred to cardiology  Active Problems:   NSTEMI (non-ST elevated myocardial infarction) ([place]) -Complaining of chest pain, serial cardiac enzymes positive -Cardiology consulted, recommended 2D echo and cardiac catheterization -Continue IV argatroban, Coreg, Cardizem, Imdur, hydralazine, pravastatin    ESRD (end stage renal disease) ([place]) on HD normal schedule MWF -Per patient, currently she is on holiday scheduling and needs HD today  -Nephrology consulted  History of pulmonary hypertension -Continue Letairis    PVD -Has undergone recent failed attempt for lower extremity bypass  History of seizure ([place]) -Continue Keppra, currently stable    HTN (hypertension) -Continue Coreg, hydralazine, amlodipine and isosorbide  Chronic systolic CHF -EF 34-19% on 2D echo 07/2015,  PA pressure 86, TR -Obtain 2D echo, volume management with hemodialysis  Code Status: full Code DVT Prophylaxis: IV argatroban Family Communication: Discussed in detail with the patient, all imaging results, lab results explained to the patient   Disposition Plan:   Time Spent in minutes   35 minutes  Procedures:    Consultants:   Nephrology cardiology  Antimicrobials:      Medications  Scheduled Meds: . ambrisentan  5 mg Oral Daily  . amLODipine  10 mg Oral QHS  . carvedilol  25 mg Oral BID WC  . cilostazol  100 mg Oral BID AC  . [START ON 11/22/2016] cinacalcet  90 mg Oral Q M,W,F-HD  . [START ON 11/22/2016] diphenhydrAMINE  50 mg Oral Q M,W,F  . ferric citrate  420 mg Oral TID WC  . folic acid-vitamin b complex-vitamin c-selenium-zinc  1 tablet Oral Daily  . hydrALAZINE  25 mg Oral BID  . isosorbide dinitrate  20 mg Oral TID  . levETIRAcetam  500 mg Oral Q2000  . levothyroxine  150 mcg Oral QAC breakfast  . pantoprazole  40 mg Oral Daily  . pravastatin  40 mg Oral QHS   Continuous Infusions: . argatroban 1 mcg/kg/min (11/21/16 0855)  . diltiazem (CARDIZEM)  infusion 10 mg/hr (11/21/16 0830)   PRN Meds:.acetaminophen **OR** acetaminophen, nitroGLYCERIN, ondansetron **OR** ondansetron (ZOFRAN) IV, oxyCODONE, traZODone   Antibiotics   Anti-infectives (From admission, onward)   None        Subjective:   Shira Baray was seen and examined today.  States chest pain is improving after nitro and oxycodone. Patient denies dizziness, abdominal pain, N/V/D/C, new weakness, numbess, tingling. No acute events overnight.  HR still borderline high  Objective:   Vitals:   11/21/16 0715 11/21/16 0754 11/21/16 0754 11/21/16 0845  BP: 113/85  115/80 120/70  Pulse: 99  (!) 104 (!) 114  Resp: 16   18  SpO2: 96% 97%  93%  Weight:      Height:       No intake or output data in the 24 hours ending 11/21/16 0952   Wt Readings from Last 3 Encounters:  11/20/16  78.4 kg (172 lb 13.5 oz)  10/26/16 78.4 kg (172 lb 12.8 oz)  10/13/16 80.3 kg (177 lb)     Exam  General: Alert and oriented x 3, NAD  Eyes:  HEENT:  Atraumatic, normocephalic  Cardiovascular: S1 S2 auscultated, irregularly irregular, tachycardia  Respiratory: Clear to auscultation bilaterally, no wheezing, rales or rhonchi  Gastrointestinal: Soft, nontender, nondistended, + bowel sounds  Ext: no pedal edema bilaterally. Fistula in LUE  Neuro: AAOx3, Cr N's II- XII. Strength 5/5 upper and lower extremities bilaterally, speech clear, sensations grossly intact  Musculoskeletal: No digital cyanosis, clubbing  Skin: No rashes  Psych: Normal affect and demeanor, alert and oriented x3    Data Reviewed:  I have personally reviewed following labs and imaging studies  Micro Results No results found for this or any previous visit (from the past 240 hour(s)).  Radiology Reports Dg Chest 2 View  Result Date: 11/20/2016 CLINICAL DATA:  Chest pain for 1 day. EXAM: CHEST  2 VIEW COMPARISON:  09/03/2014 and older exams. FINDINGS: Cardiac silhouette is mildly enlarged. No mediastinal or hilar masses or evidence of adenopathy. There are thickened interstitial markings bilaterally similar to the most recent prior study. No lung consolidation. There are small bilateral pleural effusions. No pneumothorax. Skeletal structures are grossly intact. IMPRESSION: 1. Cardiomegaly, interstitial thickening and small pleural effusions. Findings consistent with mild congestive heart failure with interstitial edema. Electronically Signed   By: Lajean Manes M.D.   On: 11/20/2016 18:06    Lab Data:  CBC: Recent Labs  Lab 11/20/16 1653 11/20/16 1703 11/21/16 0325  WBC 3.2*  --  2.9*  HGB 11.0* 12.2 10.4*  HCT 35.2* 36.0 33.1*  MCV 81.9  --  81.3  PLT 172  --  789*   Basic Metabolic Panel: Recent Labs  Lab 11/20/16 1653 11/20/16 1703 11/21/16 0325  NA 136 135 137  K 3.6 3.5 3.5  CL 95*  92* 95*  CO2 27  --  32  GLUCOSE 90 94 84  BUN 8 10 14   CREATININE 6.73* 6.60* 7.58*  CALCIUM 9.3  --  9.1   GFR: Estimated Creatinine Clearance: 6.6 mL/min (A) (by C-G formula based on SCr of 7.58 mg/dL (H)). Liver Function Tests: No results for input(s): AST, ALT, ALKPHOS, BILITOT, PROT, ALBUMIN in the last 168 hours. No results for input(s): LIPASE, AMYLASE in the last 168 hours. No results for input(s): AMMONIA in the last 168 hours. Coagulation Profile: No results for input(s): INR, PROTIME in the last 168 hours. Cardiac Enzymes: Recent Labs  Lab 11/20/16 2246 11/21/16 0325  TROPONINI  0.95* 2.06*   BNP (last 3 results) No results for input(s): PROBNP in the last 8760 hours. HbA1C: No results for input(s): HGBA1C in the last 72 hours. CBG: No results for input(s): GLUCAP in the last 168 hours. Lipid Profile: No results for input(s): CHOL, HDL, LDLCALC, TRIG, CHOLHDL, LDLDIRECT in the last 72 hours. Thyroid Function Tests: Recent Labs    11/20/16 2355  TSH 1.701   Anemia Panel: No results for input(s): VITAMINB12, FOLATE, FERRITIN, TIBC, IRON, RETICCTPCT in the last 72 hours. Urine analysis:    Component Value Date/Time   COLORURINE YELLOW 09/03/2014 0018   APPEARANCEUR CLOUDY (A) 09/03/2014 0018   LABSPEC 1.010 09/03/2014 0018   PHURINE 5.5 09/03/2014 0018   GLUCOSEU 250 (A) 09/03/2014 0018   HGBUR TRACE (A) 09/03/2014 0018   BILIRUBINUR NEGATIVE 09/03/2014 0018   KETONESUR NEGATIVE 09/03/2014 0018   PROTEINUR 100 (A) 09/03/2014 0018   UROBILINOGEN 0.2 09/03/2014 0018   NITRITE NEGATIVE 09/03/2014 0018   LEUKOCYTESUR NEGATIVE 09/03/2014 0018     Patryce Depriest M.D. [facility] 11/21/2016, 9:52 AM  Pager: [phone] Between 7am to 7pm - call Pager - [phone]  After 7pm go to [email or web address] - password [email or web address]  Call night coverage person covering after 7pm

## 2016-11-21 NOTE — ED Notes (Signed)
2L [place] applied due to pt desat to 88%, pt now 94%

## 2016-11-21 NOTE — Consult Note (Addendum)
Cardiology Consultation:   Patient ID: [name]; [id number]; 10/15/1945   Admit date: 11/20/2016 Date of Consult: 11/21/2016  Primary Care Provider: Glendale Chard, MD Primary Cardiologist: Hochrein Primary Electrophysiologist:  n/a  Chief Complaint: chest pain  Patient Profile:   [name] is a 71 y.o. female with a hx of CAD, pulmonary hypertension, ESRD on HD, HTN, peripheral vascular disease, seizures, stroke, sleep apnea who presents with chest pain.  History of Present Illness:   Patient has been having intermittent chest pain for the past several months.  Saw Dr. Percival Spanish on 10/26/2016, who felt symptoms may be costochondritis, although it seemed to improve with nitrates.  Her isosorbide was increased to TID.  Pt has continued to have CP since then.  She is minimally active and really only ambulates around her house, but states that the pain is not clearly exertional.  Typically her pain improves with nitroglycerin.  Yesterday, she began having worsening pain that was not relieved by nitroglycerin.  She presented to the ED.  Initial VS 137/82, HR 138, RR 17.  ECG notable for AF with RVR.  Labs notable for Troponin 0.95->2.06.  No ischemic changes.  Was started on IV diltiazem for rate control and argatroban for anticoagulation, due to history of severe reaction to heparin.  Current pain level is 2/10.  Had a lexiscan SPECT on 07/11/2016 which showed inferior scar and was considered a low risk study.  Last Echo was 07/2015 and showed a PASP of 86, EF 45-50%, severe TR.    Past Medical History:  Diagnosis Date  . Anemia    History  of Blood transfusion  . CAD (coronary artery disease)    stent to RCA  . Cancer ([place])    clear cell cancer, kidney  . Chronic renal insufficiency    On hemodialysis  . Complication of anesthesia 12/2010   pt is very confused, with AMS with anesthesia  . CVA 07/27/2008   CVA affected cognition and memory per family, no focal  deficits.    . CVA (cerebral infarction) 2003   no apparent residual  . Diastolic congestive heart failure ([place])   . Dyslipidemia   . Encephalopathy   . ESRD 07/27/2008   ESRD due to HTN and NSAID's, started hemodialysis in 2005 in [place], [place]. Went to [facility] from 2010 to 2012 and since 2012 has been getting dialysis at [facility] on [address] in [place] on a MWF schedule. First access with RUA AVG placed in [place]. Next and current access was LUA AVG placed by Dr. Lucky Cowboy in [place] in or around 2012. Has had 2 or 3 procedures on that graft since placed per family. She gets her access work done here in [place] now. She is allergic to heparin and does not get any heparin at dialysis; she had an allergic reaction apparently when in ICU in the past.      . GERD (gastroesophageal reflux disease)   . Hyperlipidemia   . Hypertension   . Hypothyroidism   . Positive PPD    completed rifampin  . Pulmonary hypertension ([place])   . PVD (peripheral vascular disease) ([place])   . Seizures ([place])    last seizure 6 years ago  . Sleep apnea    Sleep Study 2008  . Traumatic seroma of left lower leg     Past Surgical History:  Procedure Laterality Date  . Abdominal Aortogram N/A 12/31/2014   Performed by Conrad Donaldson, MD at [place]  CV LAB  . Abdominal Aortogram w/Lower Extremity Bilateral 06/15/2016   Performed by Conrad Bono, MD at [place] [facility]  . ABDOMINAL HYSTERECTOMY    . APPENDECTOMY    . APPLICATION OF WOUND VAC Left 09/12/2016   Performed by Conrad Adair, MD at [place]  . ARTERY EXPLORATION LEFT ABOVE KNEE POPLITEAL AND GROIN Left 07/25/2016   Performed by Conrad Monahans, MD at [place]  . CARDIAC CATHETERIZATION     last 2016  . CHOLECYSTECTOMY    . D&Cs    . EVACUATION HEMATOMA Left 09/12/2016   Performed by Conrad [place], MD at [place]  . LEFT HEART CATHETERIZATION WITH CORONARY ANGIOGRAM N/A 02/22/2011   Performed by Wellington Hampshire, MD at [facility] CATH LAB  . left  nephrectomy    . NEPHRECTOMY Left    Malignant tumor  . right ankle repair    . RIGHT HEART CATH N/A 01/29/2014   Performed by Larey Dresser, MD at [facility] CATH LAB  . TONSILLECTOMY    . VASCULAR SURGERY  11/2010   graft inserted to left arm     Inpatient Medications: Scheduled Meds: . ambrisentan  5 mg Oral Daily  . amLODipine  10 mg Oral QHS  . carvedilol  25 mg Oral BID WC  . cilostazol  100 mg Oral BID AC  . [START ON 11/22/2016] cinacalcet  90 mg Oral Q M,W,F-HD  . [START ON 11/22/2016] diphenhydrAMINE  50 mg Oral Q M,W,F  . ferric citrate  420 mg Oral TID WC  . folic acid-vitamin b complex-vitamin c-selenium-zinc  1 tablet Oral Daily  . hydrALAZINE  25 mg Oral BID  . isosorbide dinitrate  20 mg Oral TID  . levETIRAcetam  500 mg Oral Q2000  . levothyroxine  150 mcg Oral QAC breakfast  . pantoprazole  40 mg Oral Daily  . pravastatin  40 mg Oral QHS   Continuous Infusions: . argatroban 1 mcg/kg/min (11/21/16 0036)  . diltiazem (CARDIZEM) infusion 5 mg/hr (11/21/16 0115)   PRN Meds: acetaminophen **OR** acetaminophen, nitroGLYCERIN, ondansetron **OR** ondansetron (ZOFRAN) IV, oxyCODONE, traZODone  Home Meds: Prior to Admission medications   Medication Sig Start Date End Date Taking? Authorizing Provider  acetaminophen (TYLENOL) 500 MG tablet Take 1,000 mg by mouth daily as needed for mild pain.    Yes [provider]  amLODipine (NORVASC) 10 MG tablet Take 10 mg by mouth at bedtime.    Yes [provider]  aspirin EC 81 MG tablet Take 81 mg by mouth daily.   Yes [provider]  carvedilol (COREG) 25 MG tablet Take 25 mg by mouth 2 (two) times daily with a meal.   Yes [provider]  cilostazol (PLETAL) 100 MG tablet Take 1 tablet (100 mg total) by mouth 2 (two) times daily before a meal. 05/24/16  Yes Conrad [place], MD  diphenhydrAMINE (BENADRYL) 50 MG capsule Take 1 capsule (50 mg total) by mouth every Monday, Wednesday, and Friday. On  dialysis days 09/15/16  Yes Rhyne, Samantha J, PA-C  ferric citrate (AURYXIA) 1 GM 210 MG(Fe) tablet Take 420 mg by mouth 3 (three) times daily with meals. And 1 tablet with snacks   Yes [provider]  folic acid-vitamin b complex-vitamin c-selenium-zinc (DIALYVITE) 3 MG TABS tablet Take 1 tablet by mouth daily.   Yes [provider]  hydrALAZINE (APRESOLINE) 25 MG tablet Take 25 mg 2 (two) times daily by mouth.  Yes [provider]  isosorbide dinitrate (ISORDIL) 20 MG tablet Take 1 tablet (20 mg total) by mouth 3 (three) times daily. 10/26/16  Yes Hochrein, Jeneen Rinks, MD  LETAIRIS 5 MG tablet TAKE 1 TABLET (5MG ) BY MOUTH DAILY. DO NOT HANDLE IF PREGNANT. DO NOTSPLIT, CRUSH, OR CHEW. AVOID INHALATION AND CONTACT WITH SKIN OR EYES. 09/18/16  Yes Juanito Doom, MD  levETIRAcetam (KEPPRA) 500 MG tablet Take 500 mg by mouth daily at 8 pm.   Yes [provider]  levothyroxine (SYNTHROID, LEVOTHROID) 150 MCG tablet Take 150 mcg by mouth daily before breakfast.   Yes [provider]  nitroGLYCERIN (NITROSTAT) 0.4 MG SL tablet Place 1 tablet (0.4 mg total) under the tongue every 5 (five) minutes as needed for chest pain. 10/26/16  Yes Minus Breeding, MD  omeprazole (PRILOSEC OTC) 20 MG tablet Take 20 mg by mouth daily.   Yes [provider]  oxyCODONE (ROXICODONE) 5 MG immediate release tablet Take 1 tablet (5 mg total) by mouth every 6 (six) hours as needed for severe pain. 09/14/16  Yes Rhyne, Samantha J, PA-C  pravastatin (PRAVACHOL) 40 MG tablet Take 1 tablet (40 mg total) by mouth at bedtime. 05/23/16  Yes Minus Breeding, MD  SENSIPAR 90 MG tablet Take 90 mg by mouth every Monday, Wednesday, and Friday with hemodialysis.  03/05/14  Yes [provider]  traMADol (ULTRAM) 50 MG tablet Take 1 tablet (50 mg total) by mouth every 12 (twelve) hours as needed for moderate pain. 07/26/16  Yes Rhyne, Hulen Shouts, PA-C  traZODone (DESYREL) 50 MG tablet  Take 50 mg by mouth at bedtime as needed for sleep.    Yes [provider]    Allergies:    Allergies  Allergen Reactions  . Ace Inhibitors Anaphylaxis and Rash  . Heparin Other (See Comments)    MDs told her not to take after reaction in ICU  . Iohexol Swelling and Other (See Comments)    1970s; passed out and had facial/tongue swelling.  Requires 13-hour prep with prednisone and benadryl  . Phenytoin Other (See Comments)    Had reaction while in ICU; doesn't know.  MDs told her not to take ever again.  . Wellbutrin [Bupropion] Other (See Comments)    seizures  . Meperidine Hcl Swelling and Rash    Makes tongue swell  . Morphine Rash  . Penicillins Hives and Rash    Has patient had a PCN reaction causing immediate rash, facial/tongue/throat swelling, SOB or lightheadedness with hypotension: Yes Has patient had a PCN reaction causing severe rash involving mucus membranes or skin necrosis: No Has patient had a PCN reaction that required hospitalization: No Has patient had a PCN reaction occurring within the last 10 years: No If all of the above answers are "NO", then may proceed with Cephalosporin use.    . Valproic Acid And Related Other (See Comments)    Confusion   . Iodinated Diagnostic Agents Other (See Comments)    Pre-meditate with benadryl and prednisone 3 times before appt.  . Pentazocine Lactate Other (See Comments)    Patient does not remember reaction to this med ([place]).     Social History:   Social History   Socioeconomic History  . Marital status: Divorced    Spouse name: Not on file  . Number of children: 2  . Years of education: 52  . Highest education level: Not on file  Social Needs  . Financial resource strain: Not on file  .  Food insecurity - worry: Not on file  . Food insecurity - inability: Not on file  . Transportation needs - medical: Not on file  . Transportation needs - non-medical: Not on file  Occupational History  .  Occupation: Retired Optician, dispensing: RETIRED  Tobacco Use  . Smoking status: Current Some Day Smoker    Packs/day: 0.00    Years: 53.00    Pack years: 0.00    Types: Cigarettes  . Smokeless tobacco: Never Used  Substance and Sexual Activity  . Alcohol use: Yes    Alcohol/week: 0.0 oz    Comment: very rarely per pt  . Drug use: No    Comment: former marijuana use, several years  . Sexual activity: Not Currently    Birth control/protection: Post-menopausal  Other Topics Concern  . Not on file  Social History Narrative  . Not on file    Family History:   The patient's family history includes Coronary artery disease in her sister; Dementia in her mother; Heart attack in her sister; Heart disease in her father; Hypertension in her brother and mother.  ROS:  Please see the history of present illness.    Physical Exam/Data:   Vitals:   11/21/16 0215 11/21/16 0245 11/21/16 0330 11/21/16 0415  BP: 130/70 115/68 114/85 121/73  Pulse: (!) 131 (!) 101 (!) 127 71  Resp: (!) 25 19 (!) 23 (!) 25  SpO2: 96% 94% 95% 96%  Weight:      Height:       No intake or output data in the 24 hours ending 11/21/16 0513 Filed Weights   11/20/16 2300  Weight: 78.4 kg (172 lb 13.5 oz)   Body mass index is 31.11 kg/m.  General: Well developed, well nourished, in no acute distress. Head: Normocephalic, atraumatic, sclera non-icteric, no xanthomas, nares are without discharge.  Neck: JVD not elevated Lungs: Clear bilaterally to auscultation without wheezes, rales, or rhonchi. Breathing is unlabored. Heart: Irregularly irregular, no murmurs Abdomen: Soft, non-tender, non-distended with normoactive bowel sounds. No hepatomegaly. No rebound/guarding. No obvious abdominal masses. Msk:  Strength and tone appear normal for age. Extremities: No clubbing or cyanosis. No edema. Fistula in LUE. Neuro: Alert and oriented X 3. No facial asymmetry. No focal deficit. Moves all extremities  spontaneously. Psych:  Responds to questions appropriately with a normal affect.  EKG:  The EKG was personally reviewed and demonstrates atrial fibrillation, no acute ST segment changes  Relevant CV Studies: Had a lexiscan SPECT on 2016-07-22 which showed inferior scar and was considered a low risk study.  Last Echo was 07/2015 and showed a PASP of 86, EF 45-50%, severe TR.    Laboratory Data:  Chemistry Recent Labs  Lab 11/20/16 1653 11/20/16 1703 11/21/16 0325  NA 136 135 137  K 3.6 3.5 3.5  CL 95* 92* 95*  CO2 27  --  32  GLUCOSE 90 94 84  BUN 8 10 14   CREATININE 6.73* 6.60* 7.58*  CALCIUM 9.3  --  9.1  GFRNONAA 5*  --  5*  GFRAA 6*  --  5*  ANIONGAP 14  --  10    No results for input(s): PROT, ALBUMIN, AST, ALT, ALKPHOS, BILITOT in the last 168 hours. Hematology Recent Labs  Lab 11/20/16 1653 11/20/16 1703 11/21/16 0325  WBC 3.2*  --  2.9*  RBC 4.30  --  4.07  HGB 11.0* 12.2 10.4*  HCT 35.2* 36.0 33.1*  MCV 81.9  --  81.3  MCH 25.6*  --  25.6*  MCHC 31.3  --  31.4  RDW 17.3*  --  17.4*  PLT 172  --  147*   Cardiac Enzymes Recent Labs  Lab 11/20/16 2246 11/21/16 0325  TROPONINI 0.95* 2.06*    Recent Labs  Lab 11/20/16 1701  TROPIPOC 0.06    BNPNo results for input(s): BNP, PROBNP in the last 168 hours.  DDimer No results for input(s): DDIMER in the last 168 hours.  Radiology/Studies:  Dg Chest 2 View  Result Date: 11/20/2016 CLINICAL DATA:  Chest pain for 1 day. EXAM: CHEST  2 VIEW COMPARISON:  09/03/2014 and older exams. FINDINGS: Cardiac silhouette is mildly enlarged. No mediastinal or hilar masses or evidence of adenopathy. There are thickened interstitial markings bilaterally similar to the most recent prior study. No lung consolidation. There are small bilateral pleural effusions. No pneumothorax. Skeletal structures are grossly intact. IMPRESSION: 1. Cardiomegaly, interstitial thickening and small pleural effusions. Findings consistent with mild  congestive heart failure with interstitial edema. Electronically Signed   By: Lajean Manes M.D.   On: 11/20/2016 18:06    Assessment and Plan:   1. NSTEMI Unclear if true ACS or demand ischemia from RVR. She does have a concerning history of chest pain responsive to nitroglycerin for the past several months, although she had a SPECT in July that was low risk.  Would trend troponin and get echo.  Will consider catheterization today.  Agree with IV agatroban for now.  - echocardiogram - trend troponin - agree with IV argatroban - will consider catheterization today  2. Atrial fibrillation with RVR New diagnosis.  High risk for thromboembolism (CHADS-VASc=6).  Would continue IV diltiazem for rate control for now.  Can consider addition of PO rate control medications to titrate off the infusion.  Will need to determine long term plan prior to discharge (rate vs rhythm control, anticoagulation).  - continue IV diltiazem (target HR < 110) - continue IV argatroban   For questions or updates, please contact [place] Please consult [email or web address] for contact info under Cardiology/STEMI.    Liliane Bade, MD  11/21/2016 5:13 AM

## 2016-11-21 NOTE — ED Notes (Signed)
Pt placed on bedside toilet, tolerated well with one assist.

## 2016-11-21 NOTE — ED Notes (Signed)
Dialysis days Sunday, Tuesday, Friday this week due to the holiday.

## 2016-11-21 NOTE — Progress Notes (Signed)
Patient in NSR, EKG confirmed.  Spoke with Ignacia Bayley, NP he stated to continue IV diltiazem for tonight and address tomorrow.

## 2016-11-22 ENCOUNTER — Encounter (HOSPITAL_COMMUNITY)
Admission: EM | Disposition: A | Discharge status: Home Health | Payer: Self-pay | Source: Home / Self Care | Attending: Family Medicine

## 2016-11-22 ENCOUNTER — Other Ambulatory Visit (HOSPITAL_COMMUNITY): Payer: Medicare Other

## 2016-11-22 DIAGNOSIS — N186 End stage renal disease: Secondary | ICD-10-CM

## 2016-11-22 DIAGNOSIS — I214 Non-ST elevation (NSTEMI) myocardial infarction: Principal | ICD-10-CM

## 2016-11-22 HISTORY — PX: ULTRASOUND GUIDANCE FOR VASCULAR ACCESS: SHX6516

## 2016-11-22 HISTORY — PX: LEFT HEART CATH AND CORONARY ANGIOGRAPHY: CATH118249

## 2016-11-22 LAB — BASIC METABOLIC PANEL
ANION GAP: 10 (ref 5–15)
CALCIUM: 9.2 mg/dL (ref 8.9–10.3)
CO2: 28 mmol/L (ref 22–32)
CREATININE: 3.5 mg/dL — AB (ref 0.44–1.00)
Chloride: 96 mmol/L — ABNORMAL LOW (ref 101–111)
GFR calc Af Amer: 14 mL/min — ABNORMAL LOW (ref >60)
GFR, EST NON AFRICAN AMERICAN: 12 mL/min — AB (ref >60)
GLUCOSE: 79 mg/dL (ref 65–99)
POTASSIUM: 3.8 mmol/L (ref 3.5–5.1)
SODIUM: 134 mmol/L — AB (ref 135–145)

## 2016-11-22 LAB — CBC
HEMATOCRIT: 33.9 % — AB (ref 36.0–46.0)
Hemoglobin: 10.6 g/dL — ABNORMAL LOW (ref 12.0–15.0)
MCH: 25.3 pg — AB (ref 26.0–34.0)
MCHC: 31.3 g/dL (ref 30.0–36.0)
MCV: 80.9 fL (ref 78.0–100.0)
Platelets: 151 10*3/uL (ref 150–400)
RBC: 4.19 MIL/uL (ref 3.87–5.11)
RDW: 17.5 % — ABNORMAL HIGH (ref 11.5–15.5)
WBC: 3.9 10*3/uL — ABNORMAL LOW (ref 4.0–10.5)

## 2016-11-22 LAB — PROTIME-INR
INR: 1.7
PROTHROMBIN TIME: 19.8 s — AB (ref 11.4–15.2)

## 2016-11-22 LAB — APTT: aPTT: 59 seconds — ABNORMAL HIGH (ref 24–36)

## 2016-11-22 LAB — POCT ACTIVATED CLOTTING TIME: ACTIVATED CLOTTING TIME: 164 s

## 2016-11-22 LAB — MRSA PCR SCREENING: MRSA by PCR: NEGATIVE

## 2016-11-22 SURGERY — LEFT HEART CATH AND CORONARY ANGIOGRAPHY
Anesthesia: LOCAL

## 2016-11-22 MED ORDER — MIDAZOLAM HCL 2 MG/2ML IJ SOLN
INTRAMUSCULAR | Status: DC | PRN
  Administered 2016-11-22: 1 mg via INTRAVENOUS

## 2016-11-22 MED ORDER — METHYLPREDNISOLONE SODIUM SUCC 40 MG IJ SOLR
40.0000 mg | INTRAMUSCULAR | Status: DC
  Administered 2016-11-22 (×2): 40 mg via INTRAVENOUS
  Filled 2016-11-22 (×2): qty 1

## 2016-11-22 MED ORDER — IOPAMIDOL (ISOVUE-370) INJECTION 76%
INTRAVENOUS | Status: AC
  Filled 2016-11-22: qty 50

## 2016-11-22 MED ORDER — ACETAMINOPHEN 325 MG PO TABS: 650.0000 mg | ORAL_TABLET | ORAL | Status: DC | PRN

## 2016-11-22 MED ORDER — LIDOCAINE HCL (PF) 1 % IJ SOLN
INTRAMUSCULAR | Status: AC
  Filled 2016-11-22: qty 30

## 2016-11-22 MED ORDER — MIDAZOLAM HCL 2 MG/2ML IJ SOLN
INTRAMUSCULAR | Status: AC
  Filled 2016-11-22: qty 2

## 2016-11-22 MED ORDER — SODIUM CHLORIDE 0.9% FLUSH
3.0000 mL | Freq: Two times a day (BID) | INTRAVENOUS | Status: DC
  Administered 2016-11-22 – 2016-11-23 (×2): 3 mL via INTRAVENOUS

## 2016-11-22 MED ORDER — IOPAMIDOL (ISOVUE-370) INJECTION 76%
INTRAVENOUS | Status: AC
  Filled 2016-11-22: qty 100

## 2016-11-22 MED ORDER — LIDOCAINE HCL (PF) 1 % IJ SOLN
INTRAMUSCULAR | Status: DC | PRN
  Administered 2016-11-22: 15 mL

## 2016-11-22 MED ORDER — FENTANYL CITRATE (PF) 100 MCG/2ML IJ SOLN
INTRAMUSCULAR | Status: AC
  Filled 2016-11-22: qty 2

## 2016-11-22 MED ORDER — SODIUM CHLORIDE 0.9 % IV SOLN: 250.0000 mL | INTRAVENOUS | Status: DC | PRN

## 2016-11-22 MED ORDER — ASPIRIN EC 81 MG PO TBEC
81.0000 mg | DELAYED_RELEASE_TABLET | Freq: Every day | ORAL | Status: DC
  Administered 2016-11-23: 81 mg via ORAL
  Filled 2016-11-22: qty 1

## 2016-11-22 MED ORDER — ONDANSETRON HCL 4 MG/2ML IJ SOLN: 4.0000 mg | Freq: Four times a day (QID) | INTRAMUSCULAR | Status: DC | PRN

## 2016-11-22 MED ORDER — FENTANYL CITRATE (PF) 100 MCG/2ML IJ SOLN
INTRAMUSCULAR | Status: DC | PRN
  Administered 2016-11-22: 50 ug via INTRAVENOUS

## 2016-11-22 MED ORDER — DILTIAZEM HCL ER COATED BEADS 120 MG PO CP24
120.0000 mg | ORAL_CAPSULE | Freq: Every day | ORAL | Status: DC
  Administered 2016-11-22 – 2016-11-23 (×2): 120 mg via ORAL
  Filled 2016-11-22 (×2): qty 1

## 2016-11-22 MED ORDER — SODIUM CHLORIDE 0.9% FLUSH: 3.0000 mL | INTRAVENOUS | Status: DC | PRN

## 2016-11-22 MED ORDER — NITROGLYCERIN 0.4 MG/SPRAY TL SOLN
Status: AC
  Filled 2016-11-22: qty 4.9

## 2016-11-22 MED ORDER — ARGATROBAN 50 MG/50ML IV SOLN
1.0000 ug/kg/min | INTRAVENOUS | Status: DC
  Administered 2016-11-22: 1 ug/kg/min via INTRAVENOUS
  Filled 2016-11-22 (×2): qty 50

## 2016-11-22 MED ORDER — DIPHENHYDRAMINE HCL 25 MG PO CAPS
ORAL_CAPSULE | ORAL | Status: AC
  Filled 2016-11-22: qty 1

## 2016-11-22 MED ORDER — IOPAMIDOL (ISOVUE-370) INJECTION 76%
INTRAVENOUS | Status: DC | PRN
  Administered 2016-11-22: 110 mL via INTRAVENOUS

## 2016-11-22 SURGICAL SUPPLY — 10 items
CATH INFINITI 5FR MPB2 (CATHETERS) ×3 IMPLANT
CATH INFINITI 5FR MULTPACK ANG (CATHETERS) ×3 IMPLANT
COVER PRB 48X5XTLSCP FOLD TPE (BAG) ×2 IMPLANT
COVER PROBE 5X48 (BAG) ×1
KIT HEART LEFT (KITS) ×3 IMPLANT
PACK CARDIAC CATHETERIZATION (CUSTOM PROCEDURE TRAY) ×3 IMPLANT
SHEATH PINNACLE 5F 10CM (SHEATH) ×3 IMPLANT
TRANSDUCER W/STOPCOCK (MISCELLANEOUS) ×3 IMPLANT
TUBING CIL FLEX 10 FLL-RA (TUBING) ×3 IMPLANT
WIRE EMERALD 3MM-J .035X150CM (WIRE) ×3 IMPLANT

## 2016-11-22 NOTE — Interval H&P Note (Signed)
Cath Lab Visit (complete for each Cath Lab visit)  Clinical Evaluation Leading to the Procedure:   ACS: Yes.    Non-ACS:    Anginal Classification: CCS III  Anti-ischemic medical therapy: Minimal Therapy (1 class of medications)  Non-Invasive Test Results: No non-invasive testing performed  Prior CABG: No previous CABG      History and Physical Interval Note:  11/22/2016 7:19 AM  [name]  has presented today for surgery, with the diagnosis of n stemi  The various methods of treatment have been discussed with the patient and family. After consideration of risks, benefits and other options for treatment, the patient has consented to  Procedure(s): LEFT HEART CATH AND CORONARY ANGIOGRAPHY (N/A) as a surgical intervention .  The patient's history has been reviewed, patient examined, no change in status, stable for surgery.  I have reviewed the patient's chart and labs.  Questions were answered to the patient's satisfaction.     Belva Crome III

## 2016-11-22 NOTE — Progress Notes (Signed)
On 2 occassions during post cath bed rest patient found to be sitting at edge of bed eating.  Patient educated on the importance of bed rest post procedure.  Patient stated she has had these before and her site won't bleed, she would the finish last few bites of food prior to agreeing to lay back down.  Bed alarm set on medium setting.

## 2016-11-22 NOTE — Progress Notes (Signed)
Site area: RFA x1 Site Prior to Removal:  Level 0 Pressure Applied For: 20 minutes Manual:   yes Patient Status During Pull:  stable Post Pull Site:  Level 0 Post Pull Instructions Given: yes  Post Pull Pulses Present: palpable Dressing Applied:  tegaderm Bedrest begins @ 1510 Comments:

## 2016-11-22 NOTE — Progress Notes (Signed)
[facility] Progress Note   Subjective: Awake, alert, upset because she can't eat. Going to cath at 1130. No chest pain today, no chest pain with deep breaths. NAD. HD in middle of the night- removed 1000  Objective Vitals:   11/22/16 0200 11/22/16 0300 11/22/16 0342 11/22/16 0815  BP: (!) 175/87 (!) 185/96 (!) 193/83 (!) 152/96  Pulse: 80 82 82 96  Resp:   16 16  Temp:   98.1 F (36.7 C) 99 F (37.2 C)  TempSrc:   Oral Oral  SpO2:   96% 94%  Weight:   76.3 kg (168 lb 3.4 oz)   Height:       Physical Exam General: WNWD NAD Heart: S1,S2 RRR SR on monitor rare PVCs.  Lungs: CTAB A/P Abdomen: active BS SNT Extremities: No LE edema.  Dialysis Access: LUA HERO AVG + bruit  Additional Objective Labs: Basic Metabolic Panel: Recent Labs  Lab 11/21/16 0325 11/21/16 1412 11/22/16 0530  NA 136  137 134* 134*  K 3.6  3.5 3.9 3.8  CL 95*  95* 95* 96*  CO2 28  32 28 28  GLUCOSE 80  84 105* 79  BUN _0 <5*  CREATININE 7.57*  7.58* 8.29* 3.50*  CALCIUM 9.0  9.1 9.1 9.2  PHOS 4.7* 4.9*  --    Liver Function Tests: Recent Labs  Lab 11/21/16 0325 11/21/16 1412  ALBUMIN 2.9* 2.9*   No results for input(s): LIPASE, AMYLASE in the last 168 hours. CBC: Recent Labs  Lab 11/20/16 1653  11/21/16 0325 11/21/16 1412 11/22/16 0530  WBC 3.2*  --  2.9* 3.4* 3.9*  HGB 11.0*   < > 10.4* 11.0* 10.6*  HCT 35.2*   < > 33.1* 35.1* 33.9*  MCV 81.9  --  81.3 81.6 80.9  PLT 172  --  147* 138* 151   < > = values in this interval not displayed.   Blood Culture    Component Value Date/Time   SDES FLUID LEFT UPPER LEG 09/12/2016 1011   SPECREQUEST SWABS LEFT UPPER LEG HEMATOMA 09/12/2016 1011   CULT No growth aerobically or anaerobically. 09/12/2016 1011   REPTSTATUS 09/17/2016 FINAL 09/12/2016 1011    Cardiac Enzymes: Recent Labs  Lab 11/20/16 2246 11/21/16 0325 11/21/16 1247  TROPONINI 0.95* 2.06* 3.12*   CBG: No results for input(s): GLUCAP  in the last 168 hours. Iron Studies: No results for input(s): IRON, TIBC, TRANSFERRIN, FERRITIN in the last 72 hours. _1 @ Studies/Results: Dg Chest 2 View  Result Date: 11/20/2016 CLINICAL DATA:  Chest pain for 1 day. EXAM: CHEST  2 VIEW COMPARISON:  09/03/2014 and older exams. FINDINGS: Cardiac silhouette is mildly enlarged. No mediastinal or hilar masses or evidence of adenopathy. There are thickened interstitial markings bilaterally similar to the most recent prior study. No lung consolidation. There are small bilateral pleural effusions. No pneumothorax. Skeletal structures are grossly intact. IMPRESSION: 1. Cardiomegaly, interstitial thickening and small pleural effusions. Findings consistent with mild congestive heart failure with interstitial edema. Electronically Signed   By: Lajean Manes M.D.   On: 11/20/2016 18:06   Medications: . sodium chloride    . sodium chloride 10 mL/hr at 11/22/16 0617  . argatroban 1 mcg/kg/min (11/22/16 0829)   . ambrisentan  5 mg Oral Daily  . aspirin EC  81 mg Oral Daily  . [START ON 11/28/2016] calcitRIOL  2 mcg Oral Q Tue-HD  . calcitRIOL  2 mcg Oral Q M,W,F-HD  . carvedilol  25 mg Oral BID WC  . cilostazol  100 mg Oral BID AC  . [START ON 11/28/2016] cinacalcet  120 mg Oral Q Tue-HD  . cinacalcet  120 mg Oral Q M,W,F-HD  . diltiazem  120 mg Oral Daily  . diphenhydrAMINE  50 mg Oral Q M,W,F  . feeding supplement (NEPRO CARB STEADY)  237 mL Oral BID BM  . ferric citrate  420 mg Oral TID WC  . hydrALAZINE  25 mg Oral BID  . isosorbide dinitrate  20 mg Oral TID  . levETIRAcetam  500 mg Oral Q2000  . levothyroxine  150 mcg Oral QAC breakfast  . methylPREDNISolone (SOLU-MEDROL) injection  40 mg Intravenous Q4H  . multivitamin  1 tablet Oral QHS  . pantoprazole  40 mg Oral Daily  . pravastatin  40 mg Oral QHS  . sodium chloride flush  3 mL Intravenous Q12H     Dialysis Orders: MWF [facility] 4 hrs 180 NRe 450/Auto 1.5 77 kg 2.0 K/2.0 Ca  UFP 4 -NO HEPARIN (severe allergy) -Mircera 60 mcg IV q 2 week (last dose 11/15/2016 Last HGB 10.7 11/15/16) -Calcitriol 2 mcg PO TIW (last PTH 333 10/25/16) -Sensipar 120 mg PO TIW  BMD meds: Auryxia 210 mg 2 tabs PO TID (last Ca 10.1 Phos 4.8 11/15/16)  Assessment/Plan: 1.  Chest pain: Troponin peak 3.12. Cardiology consulted. For cardiac cath today/    2.  AFib RVR: Back in SR. To start PO dilt per cards. Continues on argatroban. Will need long term anticoagulation upon DC.  3.  ESRD -  MWF. HD in middle of night  via AVG. Next HD Friday.  4.  Hypertension/volume: BP elevated today. Home meds resumed. Slightly agitated D/T being NPO which may be contributing. HD 11/21/16 Pre wt 77.3 Net UF 1 liter post wt 76.3 kg. Met UFG yesterday but if BP remains high, may need to lower EDW more. BP 208 systolic before HD yest 5.  Anemia  - HGB 10.6 Recent ESA dose as OP. Follow HGB.  6.  Metabolic bone disease -  Continue binders, VDRA and sensipar Phos 4.9 Ca 9.2 7.  Nutrition - Albumin 2.9. renal diet with fld restrictions. Add nepro/renal vit  Rita H. Owens Shark, NP-C 11/21/2016, 12:05 PM  [place] [phone]  Patient seen and examined, agree with above note with above modifications. Hypertensive and angry awaiting cardiac cath - no other complaints- BP right now in the 497'N systolic and HR 69 - pt hoping heart cath negative so she can go home - next due for HD Friday  Corliss Parish, MD 11/22/2016

## 2016-11-22 NOTE — Progress Notes (Signed)
Progress Note  Patient Name: [name] Date of Encounter: 11/22/2016  Primary Cardiologist: Hochrein  Subjective   No chest pain this am.   Inpatient Medications    Scheduled Meds: . ambrisentan  5 mg Oral Daily  . amLODipine  10 mg Oral QHS  . [START ON 11/28/2016] calcitRIOL  2 mcg Oral Q Tue-HD  . calcitRIOL  2 mcg Oral Q M,W,F-HD  . carvedilol  25 mg Oral BID WC  . cilostazol  100 mg Oral BID AC  . [START ON 11/28/2016] cinacalcet  120 mg Oral Q Tue-HD  . cinacalcet  120 mg Oral Q M,W,F-HD  . diphenhydrAMINE  50 mg Oral Q M,W,F  . feeding supplement (NEPRO CARB STEADY)  237 mL Oral BID BM  . ferric citrate  420 mg Oral TID WC  . hydrALAZINE  25 mg Oral BID  . isosorbide dinitrate  20 mg Oral TID  . levETIRAcetam  500 mg Oral Q2000  . levothyroxine  150 mcg Oral QAC breakfast  . methylPREDNISolone (SOLU-MEDROL) injection  40 mg Intravenous Q4H  . multivitamin  1 tablet Oral QHS  . pantoprazole  40 mg Oral Daily  . pravastatin  40 mg Oral QHS  . sodium chloride flush  3 mL Intravenous Q12H   Continuous Infusions: . sodium chloride    . sodium chloride 10 mL/hr at 11/22/16 0617  . argatroban 1 mcg/kg/min (11/22/16 0829)  . diltiazem (CARDIZEM) infusion 5 mg/hr (11/22/16 0444)   PRN Meds: sodium chloride, acetaminophen **OR** acetaminophen, diphenhydrAMINE, nitroGLYCERIN, ondansetron **OR** ondansetron (ZOFRAN) IV, oxyCODONE, sodium chloride flush, traZODone   Vital Signs    Vitals:   11/22/16 0200 11/22/16 0300 11/22/16 0342 11/22/16 0815  BP: (!) 175/87 (!) 185/96 (!) 193/83 (!) 152/96  Pulse: 80 82 82 96  Resp:   16 16  Temp:   98.1 F (36.7 C) 99 F (37.2 C)  TempSrc:   Oral Oral  SpO2:   96% 94%  Weight:   168 lb 3.4 oz (76.3 kg)   Height:        Intake/Output Summary (Last 24 hours) at 11/22/2016 0946 Last data filed at 11/22/2016 0659 Gross per 24 hour  Intake 341.05 ml  Output 1201 ml  Net -859.95 ml   Filed Weights   11/21/16 1405  11/21/16 2335 11/22/16 0342  Weight: 171 lb 15.3 oz (78 kg) 170 lb 6.7 oz (77.3 kg) 168 lb 3.4 oz (76.3 kg)    Telemetry     Sinus with PACs - Personally Reviewed  ECG     No am EKG- Personally Reviewed  Physical Exam   GEN: No acute distress.   Neck: No JVD Cardiac: RRR with ectopy, no murmurs, rubs, or gallops.  Respiratory: Clear to auscultation bilaterally. GI: Soft, nontender, non-distended  MS: No edema; No deformity. Neuro:  Nonfocal  Psych: Normal affect   Labs    Chemistry Recent Labs  Lab 11/21/16 0325 11/21/16 1412 11/22/16 0530  NA 136  137 134* 134*  K 3.6  3.5 3.9 3.8  CL 95*  95* 95* 96*  CO2 28  32 28 28  GLUCOSE 80  84 105* 79  BUN 13  14 18  <5*  CREATININE 7.57*  7.58* 8.29* 3.50*  CALCIUM 9.0  9.1 9.1 9.2  ALBUMIN 2.9* 2.9*  --   GFRNONAA 5*  5* 4* 12*  GFRAA 6*  5* 5* 14*  ANIONGAP 13  10 11 10      Hematology Recent  Labs  Lab 11/21/16 0325 11/21/16 1412 11/22/16 0530  WBC 2.9* 3.4* 3.9*  RBC 4.07 4.30 4.19  HGB 10.4* 11.0* 10.6*  HCT 33.1* 35.1* 33.9*  MCV 81.3 81.6 80.9  MCH 25.6* 25.6* 25.3*  MCHC 31.4 31.3 31.3  RDW 17.4* 17.1* 17.5*  PLT 147* 138* 151    Cardiac Enzymes Recent Labs  Lab 11/20/16 2246 11/21/16 0325 11/21/16 1247  TROPONINI 0.95* 2.06* 3.12*    Recent Labs  Lab 11/20/16 1701  TROPIPOC 0.06     BNPNo results for input(s): BNP, PROBNP in the last 168 hours.   DDimer No results for input(s): DDIMER in the last 168 hours.   Radiology    Dg Chest 2 View  Result Date: 11/20/2016 CLINICAL DATA:  Chest pain for 1 day. EXAM: CHEST  2 VIEW COMPARISON:  09/03/2014 and older exams. FINDINGS: Cardiac silhouette is mildly enlarged. No mediastinal or hilar masses or evidence of adenopathy. There are thickened interstitial markings bilaterally similar to the most recent prior study. No lung consolidation. There are small bilateral pleural effusions. No pneumothorax. Skeletal structures are grossly  intact. IMPRESSION: 1. Cardiomegaly, interstitial thickening and small pleural effusions. Findings consistent with mild congestive heart failure with interstitial edema. Electronically Signed   By: Lajean Manes M.D.   On: 11/20/2016 18:06    Cardiac Studies     Patient Profile     71 y.o. female with history of CAD, ESRD on HD, prior CVA, HLD, HTN admitted with chest pain and found to have atrial fib with RVR and elevated troponin c/w a NSTEMI  Assessment & Plan    1. CAD/NSTEMI: plans for cardiac cath today with possible PCI. She has a possible history of HIT. She is on argatroban for anti-coagulation. Will continue ASA, beta blocker and statin.  2. Atrial fib with RVR: sinus today with PACs. Will stop Cardizem drip and start Cardizem CD 120 mg daily. Will stop Norvasc since we are starting Cardizem. Currently on argatroban which will be stopped pre-cath. She will need long term coumadin therapy.    For questions or updates, please contact [place] Please consult [email or web address] for contact info under Cardiology/STEMI.      Signed, Lauree Chandler, MD  11/22/2016, 9:46 AM

## 2016-11-22 NOTE — Care Management (Signed)
1500 11-22-16 Pt in Cath Lab at the time of visit. Pt was previously active with [facility]. CM will continue to monitor for additional disposition needs. Jacqlyn Krauss, RN,BSN [phone]

## 2016-11-22 NOTE — Progress Notes (Addendum)
[place] for argatroban  Indication: atrial fibrillation  Allergies  Allergen Reactions  . Ace Inhibitors Anaphylaxis and Rash  . Heparin Other (See Comments)    MDs told her not to take after reaction in ICU  . Iohexol Swelling and Other (See Comments)    1970s; passed out and had facial/tongue swelling.  Requires 13-hour prep with prednisone and benadryl  . Phenytoin Other (See Comments)    Had reaction while in ICU; doesn't know.  MDs told her not to take ever again.  . Wellbutrin [Bupropion] Other (See Comments)    seizures  . Meperidine Hcl Swelling and Rash    Makes tongue swell  . Morphine Rash  . Penicillins Hives and Rash    Has patient had a PCN reaction causing immediate rash, facial/tongue/throat swelling, SOB or lightheadedness with hypotension: Yes Has patient had a PCN reaction causing severe rash involving mucus membranes or skin necrosis: No Has patient had a PCN reaction that required hospitalization: No Has patient had a PCN reaction occurring within the last 10 years: No If all of the above answers are "NO", then may proceed with Cephalosporin use.    . Valproic Acid And Related Other (See Comments)    Confusion   . Iodinated Diagnostic Agents Other (See Comments)    Pre-meditate with benadryl and prednisone 3 times before appt.  . Pentazocine Lactate Other (See Comments)    Patient does not remember reaction to this med (Talwin).     Patient Measurements: Height: 5' 2.5" (158.8 cm) Weight: 168 lb 3.4 oz (76.3 kg) IBW/kg (Calculated) : 51.25  Vital Signs: Temp: 98.3 F (36.8 C) (11/21 1625) Temp Source: Oral (11/21 1625) BP: 114/94 (11/21 1625) Pulse Rate: 76 (11/21 1625)  Labs: Recent Labs    11/20/16 2246  11/21/16 0325 11/21/16 0747 11/21/16 1247 11/21/16 1412 11/21/16 1825 11/22/16 0530  HGB  --   --  10.4*  --   --  11.0*  --  10.6*  HCT  --   --  33.1*  --   --  35.1*  --  33.9*  PLT  --   --   147*  --   --  138*  --  151  APTT  --    < > 63* 60*  --   --  62* 59*  LABPROT  --   --   --   --   --   --   --  19.8*  INR  --   --   --   --   --   --   --  1.70  CREATININE  --   --  7.57*  7.58*  --   --  8.29*  --  3.50*  TROPONINI 0.95*  --  2.06*  --  3.12*  --   --   --    < > = values in this interval not displayed.    Estimated Creatinine Clearance: 14.1 mL/min (A) (by C-G formula based on SCr of 3.5 mg/dL (H)).  Assessment: 71yo female c/o chest discomfort and weakness, found to be in Afib w/ RVR improved on diltiazem. Now post-cath with orders to restart Argatroban (d/t heparin allergy) 8h after sheath removal. Sheath pull note documented at 1511.  Argatroban was at 39mcg/kg/min this morning prior to cath and aPTT was in range at 59 seconds.  Hgb 10.6, plts 151- no overt bleeding note from sheath site.  Goal of Therapy:  aPTT 50-90  seconds Monitor platelets by anticoagulation protocol: Yes    Plan:  Restart argatroban infusion at 74mcg/kg/min at 2330 tonight   Check aPTT 2h after start Daily aPTT and CBC  Delaine Canter D. Dresean Beckel, PharmD, BCPS Clinical Pharmacist [phone] 11/22/2016 4:34 PM

## 2016-11-22 NOTE — Progress Notes (Signed)
[place] for argatroban Indication: atrial fibrillation  Allergies  Allergen Reactions  . Ace Inhibitors Anaphylaxis and Rash  . Heparin Other (See Comments)    MDs told her not to take after reaction in ICU  . Iohexol Swelling and Other (See Comments)    1970s; passed out and had facial/tongue swelling.  Requires 13-hour prep with prednisone and benadryl  . Phenytoin Other (See Comments)    Had reaction while in ICU; doesn't know.  MDs told her not to take ever again.  . Wellbutrin [Bupropion] Other (See Comments)    seizures  . Meperidine Hcl Swelling and Rash    Makes tongue swell  . Morphine Rash  . Penicillins Hives and Rash    Has patient had a PCN reaction causing immediate rash, facial/tongue/throat swelling, SOB or lightheadedness with hypotension: Yes Has patient had a PCN reaction causing severe rash involving mucus membranes or skin necrosis: No Has patient had a PCN reaction that required hospitalization: No Has patient had a PCN reaction occurring within the last 10 years: No If all of the above answers are "NO", then may proceed with Cephalosporin use.    . Valproic Acid And Related Other (See Comments)    Confusion   . Iodinated Diagnostic Agents Other (See Comments)    Pre-meditate with benadryl and prednisone 3 times before appt.  . Pentazocine Lactate Other (See Comments)    Patient does not remember reaction to this med (Talwin).     Patient Measurements: Height: 5' 2.5" (158.8 cm) Weight: 168 lb 3.4 oz (76.3 kg) IBW/kg (Calculated) : 51.25  Vital Signs: Temp: 97.8 F (36.6 C) (11/21 1208) Temp Source: Oral (11/21 1208) BP: 136/63 (11/21 1208) Pulse Rate: 69 (11/21 1208)  Labs: Recent Labs    11/20/16 2246  11/21/16 0325 11/21/16 0747 11/21/16 1247 11/21/16 1412 11/21/16 1825 11/22/16 0530  HGB  --   --  10.4*  --   --  11.0*  --  10.6*  HCT  --   --  33.1*  --   --  35.1*  --  33.9*  PLT  --   --   147*  --   --  138*  --  151  APTT  --    < > 63* 60*  --   --  62* 59*  LABPROT  --   --   --   --   --   --   --  19.8*  INR  --   --   --   --   --   --   --  1.70  CREATININE  --   --  7.57*  7.58*  --   --  8.29*  --  3.50*  TROPONINI 0.95*  --  2.06*  --  3.12*  --   --   --    < > = values in this interval not displayed.    Estimated Creatinine Clearance: 14.1 mL/min (A) (by C-G formula based on SCr of 3.5 mg/dL (H)).   Medical History: Past Medical History:  Diagnosis Date  . Anemia    History  of Blood transfusion  . Atrial fibrillation with RVR ([place]) 11/2016  . CAD (coronary artery disease)    stent to RCA  . Cancer ([place])    clear cell cancer, kidney  . Chronic renal insufficiency    On hemodialysis  . Complication of anesthesia 12/2010   pt is very confused, with AMS with  anesthesia  . CVA 07/27/2008   CVA affected cognition and memory per family, no focal deficits.    . CVA (cerebral infarction) 2003   no apparent residual  . Diastolic congestive heart failure ([place])   . Dyslipidemia   . Encephalopathy   . ESRD 07/27/2008   ESRD due to HTN and NSAID's, started hemodialysis in 2005 in [place], [place]. Went to [facility] from 2010 to 2012 and since 2012 has been getting dialysis at [facility] on [address] in [place] on a MWF schedule. First access with RUA AVG placed in [place]. Next and current access was LUA AVG placed by Dr. Lucky Cowboy in [place] in or around 2012. Has had 2 or 3 procedures on that graft since placed per family. She gets her access work done here in [place] now. She is allergic to heparin and does not get any heparin at dialysis; she had an allergic reaction apparently when in ICU in the past.      . GERD (gastroesophageal reflux disease)   . Hyperlipidemia   . Hypertension   . Hypothyroidism   . Positive PPD    completed rifampin  . Pulmonary hypertension ([place])   . PVD (peripheral vascular disease) ([place])   . Seizures ([place])    last  seizure 6 years ago  . Sleep apnea    Sleep Study 2008  . Traumatic seroma of left lower leg     Assessment: 71yo female c/o chest discomfort and weakness, found to be in Afib w/ RVR improved on diltiazem.  Currently on argatroban d/t heparin allergy, drip rate  68mcg/kg/min, aptt 59 sec - at goal.  CBC stable.     Goal of Therapy:  aPTT 50-90 seconds Monitor platelets by anticoagulation protocol: Yes    Plan:  Continue argatroban infusion at 1 mcg/kg/min  Check pTT daily Cardiac cath planned for today - f/u after   [name].D. CPP, BCPS Clinical Pharmacist [phone] 11/22/2016 12:40 PM

## 2016-11-22 NOTE — Progress Notes (Signed)
PROGRESS NOTE [facility]   [name]   NUU:[id number] DOB: 10/28/1945  DOA: 11/20/2016 PCP: Glendale Chard, MD   Brief Narrative:  [name] is a 71 year old female with CAD, ESRD on hemodialysis MWF, anemia, hypertension, pulmonary hypertension, CVA presented with chest pain.  In the ED, patient was found to have new onset atrial fibrillation with RVR. Trop 2.06, cardiology consulted for NSTEMI, She underwent cardiac cath, with severe diffuse CAD, no stent were placed during procedure.    Subjective: Patient seen and examined, denies chest pain. Patient wants to go home. No acute events overnight.   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 Cardiology following. Cardizem switched to 120 mg PO today. Continue to monitor. Will need warfarin long term. Defer to cardiology when to start bridging. She may need stents.   CAD/NSTEMI  Cardiac cath today reveal severe diffuse CAD. No stent were placed  Per cardiology note "In comparing images with prior study from 2013, perhaps continued medical therapy is best.  The right coronary cannot be treated percutaneously due to ostial stent overhang.  Left main/LAD/circumflex disease could be stented but would be complex and require indefinite dual antiplatelet therapy.  Not sure that she is a surgical candidate given comorbidities and continued smoking. Heart team approach" Defer management to cardiology, for now continue Coreg, Hydralazine, Statin and Imdur  Norvasc d/ced   ESRD (end stage renal disease) ([name]) on HD normal schedule MWF Continue HD per nephrology   History of pulmonary hypertension Continue Letairis  History of seizure ([name]) Continue Keppra, currently stable  HTN (hypertension) Continue Coreg, hydralazine and isosorbide Amlodipine d/ced per cards   Chronic  systolic CHF EF 34-74% on 2D echo 07/2015, PA pressure 86, TR EF on Cardiac cath 50%  No volume overload   DVT prophylaxis: Argatroban  Code Status: Full code Family Communication: None at bedside  Disposition Plan: Home when cleared by cardiology   Consultants:   Cardiology   Nephrology   Procedures:   Cardiac cath 11/22/16  HD   Antimicrobials:  None     Objective: Vitals:   11/22/16 1535 11/22/16 1540 11/22/16 1545 11/22/16 1625  BP: (!) 159/59 (!) 171/60 (!) 169/63 (!) 159/65  Pulse: 74 77 77 76  Resp: 16 (!) 31 15 15   Temp:    98.3 F (36.8 C)  TempSrc:    Oral  SpO2: 97% 100% 98% 96%  Weight:      Height:        Intake/Output Summary (Last 24 hours) at 11/22/2016 1749 Last data filed at 11/22/2016 1100 Gross per 24 hour  Intake 701.05 ml  Output 1201 ml  Net -499.95 ml   Filed Weights   11/21/16 1405 11/21/16 2335 11/22/16 0342  Weight: 78 kg (171 lb 15.3 oz) 77.3 kg (170 lb 6.7 oz) 76.3 kg (168 lb 3.4 oz)    Examination:  General exam: Appears calm and comfortable  HEENT:  OP moist and clear Respiratory system: Clear to auscultation. No wheezes,crackle or rhonchi Cardiovascular system: S1 & S2 heard, RRR. No JVD, murmurs, rubs or gallops Gastrointestinal system: Abdomen is nondistended, soft and nontender. Central nervous system: Alert and oriented. No focal neurological deficits. Extremities: No LE edema, LUA AVG. Palpable thrill  Skin: No rashes Psychiatry: Judgement and insight appear normal. Mood & affect appropriate.    Data Reviewed: I have  personally reviewed following labs and imaging studies  CBC: Recent Labs  Lab 11/20/16 1653 11/20/16 1703 11/21/16 0325 11/21/16 1412 11/22/16 0530  WBC 3.2*  --  2.9* 3.4* 3.9*  HGB 11.0* 12.2 10.4* 11.0* 10.6*  HCT 35.2* 36.0 33.1* 35.1* 33.9*  MCV 81.9  --  81.3 81.6 80.9  PLT 172  --  147* 138* 557   Basic Metabolic Panel: Recent Labs  Lab 11/20/16 1653 11/20/16 1703  11/21/16 0325 11/21/16 1412 11/22/16 0530  NA 136 135 136  137 134* 134*  K 3.6 3.5 3.6  3.5 3.9 3.8  CL 95* 92* 95*  95* 95* 96*  CO2 27  --  28  32 28 28  GLUCOSE 90 94 80  84 105* 79  BUN 8 10 13  14 18  <5*  CREATININE 6.73* 6.60* 7.57*  7.58* 8.29* 3.50*  CALCIUM 9.3  --  9.0  9.1 9.1 9.2  PHOS  --   --  4.7* 4.9*  --    GFR: Estimated Creatinine Clearance: 14.1 mL/min (A) (by C-G formula based on SCr of 3.5 mg/dL (H)). Liver Function Tests: Recent Labs  Lab 11/21/16 0325 11/21/16 1412  ALBUMIN 2.9* 2.9*   No results for input(s): LIPASE, AMYLASE in the last 168 hours. No results for input(s): AMMONIA in the last 168 hours. Coagulation Profile: Recent Labs  Lab 11/22/16 0530  INR 1.70   Cardiac Enzymes: Recent Labs  Lab 11/20/16 2246 11/21/16 0325 11/21/16 1247  TROPONINI 0.95* 2.06* 3.12*   BNP (last 3 results) No results for input(s): PROBNP in the last 8760 hours. HbA1C: No results for input(s): HGBA1C in the last 72 hours. CBG: No results for input(s): GLUCAP in the last 168 hours. Lipid Profile: No results for input(s): CHOL, HDL, LDLCALC, TRIG, CHOLHDL, LDLDIRECT in the last 72 hours. Thyroid Function Tests: Recent Labs    11/20/16 2355  TSH 1.701   Anemia Panel: No results for input(s): VITAMINB12, FOLATE, FERRITIN, TIBC, IRON, RETICCTPCT in the last 72 hours. Sepsis Labs: No results for input(s): PROCALCITON, LATICACIDVEN in the last 168 hours.  Recent Results (from the past 240 hour(s))  MRSA PCR Screening     Status: None   Collection Time: 11/22/16 12:45 AM  Result Value Ref Range Status   MRSA by PCR NEGATIVE NEGATIVE Final    Comment:        The GeneXpert MRSA Assay (FDA approved for NASAL specimens only), is one component of a comprehensive MRSA colonization surveillance program. It is not intended to diagnose MRSA infection nor to guide or monitor treatment for MRSA infections.       Radiology Studies: Dg Chest 2  View  Result Date: 11/20/2016 CLINICAL DATA:  Chest pain for 1 day. EXAM: CHEST  2 VIEW COMPARISON:  09/03/2014 and older exams. FINDINGS: Cardiac silhouette is mildly enlarged. No mediastinal or hilar masses or evidence of adenopathy. There are thickened interstitial markings bilaterally similar to the most recent prior study. No lung consolidation. There are small bilateral pleural effusions. No pneumothorax. Skeletal structures are grossly intact. IMPRESSION: 1. Cardiomegaly, interstitial thickening and small pleural effusions. Findings consistent with mild congestive heart failure with interstitial edema. Electronically Signed   By: Lajean Manes M.D.   On: 11/20/2016 18:06      Scheduled Meds: . ambrisentan  5 mg Oral Daily  . aspirin EC  81 mg Oral Daily  . [START ON 11/28/2016] calcitRIOL  2 mcg Oral Q Tue-HD  . calcitRIOL  2  mcg Oral Q M,W,F-HD  . carvedilol  25 mg Oral BID WC  . cilostazol  100 mg Oral BID AC  . [START ON 11/28/2016] cinacalcet  120 mg Oral Q Tue-HD  . cinacalcet  120 mg Oral Q M,W,F-HD  . diltiazem  120 mg Oral Daily  . diphenhydrAMINE  50 mg Oral Q M,W,F  . feeding supplement (NEPRO CARB STEADY)  237 mL Oral BID BM  . ferric citrate  420 mg Oral TID WC  . hydrALAZINE  25 mg Oral BID  . isosorbide dinitrate  20 mg Oral TID  . levETIRAcetam  500 mg Oral Q2000  . levothyroxine  150 mcg Oral QAC breakfast  . multivitamin  1 tablet Oral QHS  . pantoprazole  40 mg Oral Daily  . pravastatin  40 mg Oral QHS  . sodium chloride flush  3 mL Intravenous Q12H   Continuous Infusions: . sodium chloride    . argatroban       LOS: 1 day   Time spent: Total of 25 minutes spent with pt, greater than 50% of which was spent in discussion of  treatment, counseling and coordination of care  Chipper Oman, MD Pager: Text Page via [email or web address]   If 7PM-7AM, please contact night-coverage [email or web address] 11/22/2016, 5:49 PM

## 2016-11-23 ENCOUNTER — Inpatient Hospital Stay (HOSPITAL_COMMUNITY): Payer: Medicare Other

## 2016-11-23 DIAGNOSIS — I361 Nonrheumatic tricuspid (valve) insufficiency: Secondary | ICD-10-CM

## 2016-11-23 LAB — CBC
HCT: 30.7 % — ABNORMAL LOW (ref 36.0–46.0)
HEMOGLOBIN: 9.7 g/dL — AB (ref 12.0–15.0)
MCH: 25.7 pg — ABNORMAL LOW (ref 26.0–34.0)
MCHC: 31.6 g/dL (ref 30.0–36.0)
MCV: 81.2 fL (ref 78.0–100.0)
PLATELETS: 139 10*3/uL — AB (ref 150–400)
RBC: 3.78 MIL/uL — AB (ref 3.87–5.11)
RDW: 17.3 % — ABNORMAL HIGH (ref 11.5–15.5)
WBC: 3.9 10*3/uL — AB (ref 4.0–10.5)

## 2016-11-23 LAB — ECHOCARDIOGRAM COMPLETE
HEIGHTINCHES: 62.5 in
WEIGHTICAEL: 2704 [oz_av]

## 2016-11-23 LAB — APTT
APTT: 72 s — AB (ref 24–36)
aPTT: 67 seconds — ABNORMAL HIGH (ref 24–36)

## 2016-11-23 MED ORDER — WARFARIN - PHARMACIST DOSING INPATIENT: Freq: Every day | Status: DC

## 2016-11-23 MED ORDER — WARFARIN SODIUM 5 MG PO TABS: 5.0000 mg | ORAL_TABLET | Freq: Once | ORAL | 0 refills | Status: DC

## 2016-11-23 MED ORDER — WARFARIN SODIUM 5 MG PO TABS
5.0000 mg | ORAL_TABLET | Freq: Once | ORAL | Status: DC
  Filled 2016-11-23: qty 1

## 2016-11-23 MED ORDER — DILTIAZEM HCL ER COATED BEADS 120 MG PO CP24: 120.0000 mg | ORAL_CAPSULE | Freq: Every day | ORAL | 0 refills | Status: DC

## 2016-11-23 MED ORDER — AMBRISENTAN 5 MG PO TABS
5.0000 mg | ORAL_TABLET | Freq: Every day | ORAL | Status: DC
  Administered 2016-11-23: 5 mg via ORAL

## 2016-11-23 NOTE — Progress Notes (Addendum)
[place] for argatroban  Indication: atrial fibrillation  Allergies  Allergen Reactions  . Ace Inhibitors Anaphylaxis and Rash  . Heparin Other (See Comments)    MDs told her not to take after reaction in ICU  . Iohexol Swelling and Other (See Comments)    1970s; passed out and had facial/tongue swelling.  Requires 13-hour prep with prednisone and benadryl  . Phenytoin Other (See Comments)    Had reaction while in ICU; doesn't know.  MDs told her not to take ever again.  . Wellbutrin [Bupropion] Other (See Comments)    seizures  . Meperidine Hcl Swelling and Rash    Makes tongue swell  . Morphine Rash  . Penicillins Hives and Rash    Has patient had a PCN reaction causing immediate rash, facial/tongue/throat swelling, SOB or lightheadedness with hypotension: Yes Has patient had a PCN reaction causing severe rash involving mucus membranes or skin necrosis: No Has patient had a PCN reaction that required hospitalization: No Has patient had a PCN reaction occurring within the last 10 years: No If all of the above answers are "NO", then may proceed with Cephalosporin use.    . Valproic Acid And Related Other (See Comments)    Confusion   . Iodinated Diagnostic Agents Other (See Comments)    Pre-meditate with benadryl and prednisone 3 times before appt.  . Pentazocine Lactate Other (See Comments)    Patient does not remember reaction to this med ([place]).     Patient Measurements: Height: 5' 2.5" (158.8 cm) Weight: 168 lb 3.4 oz (76.3 kg) IBW/kg (Calculated) : 51.25  Vital Signs: Temp: 98 F (36.7 C) (11/21 1935) Temp Source: Oral (11/21 1935) BP: 132/57 (11/21 2117) Pulse Rate: 72 (11/21 1935)  Labs: Recent Labs    11/20/16 2246 11/21/16 0325  11/21/16 1247 11/21/16 1412 11/21/16 1825 11/22/16 0530 11/23/16 0137  HGB  --  10.4*  --   --  11.0*  --  10.6*  --   HCT  --  33.1*  --   --  35.1*  --  33.9*  --   PLT  --  147*   --   --  138*  --  151  --   APTT  --  63*   < >  --   --  62* 59* 67*  LABPROT  --   --   --   --   --   --  19.8*  --   INR  --   --   --   --   --   --  1.70  --   CREATININE  --  7.57*  7.58*  --   --  8.29*  --  3.50*  --   TROPONINI 0.95* 2.06*  --  3.12*  --   --   --   --    < > = values in this interval not displayed.    Estimated Creatinine Clearance: 14.1 mL/min (A) (by C-G formula based on SCr of 3.5 mg/dL (H)).  Assessment: 71yo female c/o chest discomfort and weakness, found to be in Afib w/ RVR improved on diltiazem. Now post-cath with orders to restart Argatroban (d/t heparin allergy) 8h after sheath removal. Sheath pull note documented at 1511.  Argatroban was at 37mcg/kg/min this morning prior to cath and aPTT was in range at 59 seconds.  Hgb 10.6, plts 151- no overt bleeding note from sheath site  11/22 AM: aPTT therapeutic x 1  after re-start  Goal of Therapy:  aPTT 50-90 seconds Monitor platelets by anticoagulation protocol: Yes    Plan:  Cont argatroban infusion at 25mcg/kg/min  Confirm aPTT in a few hours with AM labs  Narda Bonds, PharmD, BCPS Clinical Pharmacist Phone: [phone] ========================= Addendum 5:38 AM APTT therapeutic x 2 -Cont argatroban at 1 mcg/kg/min -Daily aPTT/CBC Narda Bonds, PharmD, BCPS Clinical Pharmacist Phone: [phone] ========================

## 2016-11-23 NOTE — Progress Notes (Signed)
[facility] Progress Note   Subjective: cath =severe, diffuse CAD- favoring med management but also made note to start on coumadin (on argat now) - ready to go home   Objective Vitals:   11/22/16 1625 11/22/16 1935 11/22/16 2117 11/23/16 0437  BP: (!) 159/65 (!) 82/72 (!) 132/57 (!) 158/68  Pulse: 76 72  73  Resp: 15 (!) 24  16  Temp: 98.3 F (36.8 C) 98 F (36.7 C)  97.6 F (36.4 C)  TempSrc: Oral Oral  Oral  SpO2: 96% 90%  93%  Weight:    76.7 kg (169 lb)  Height:       Physical Exam General: WNWD NAD Heart: S1,S2 RRR SR on monitor rare PVCs.  Lungs: CTAB A/P Abdomen: active BS SNT Extremities: No LE edema.  Dialysis Access: LUA HERO AVG + bruit  Additional Objective Labs: Basic Metabolic Panel: Recent Labs  Lab 11/21/16 0325 11/21/16 1412 11/22/16 0530  NA 136  137 134* 134*  K 3.6  3.5 3.9 3.8  CL 95*  95* 95* 96*  CO2 28  32 28 28  GLUCOSE 80  84 105* 79  BUN 13  14 18  <5*  CREATININE 7.57*  7.58* 8.29* 3.50*  CALCIUM 9.0  9.1 9.1 9.2  PHOS 4.7* 4.9*  --    Liver Function Tests: Recent Labs  Lab 11/21/16 0325 11/21/16 1412  ALBUMIN 2.9* 2.9*   No results for input(s): LIPASE, AMYLASE in the last 168 hours. CBC: Recent Labs  Lab 11/20/16 1653  11/21/16 0325 11/21/16 1412 11/22/16 0530 11/23/16 0443  WBC 3.2*  --  2.9* 3.4* 3.9* 3.9*  HGB 11.0*   < > 10.4* 11.0* 10.6* 9.7*  HCT 35.2*   < > 33.1* 35.1* 33.9* 30.7*  MCV 81.9  --  81.3 81.6 80.9 81.2  PLT 172  --  147* 138* 151 139*   < > = values in this interval not displayed.   Blood Culture    Component Value Date/Time   SDES FLUID LEFT UPPER LEG 09/12/2016 1011   SPECREQUEST SWABS LEFT UPPER LEG HEMATOMA 09/12/2016 1011   CULT No growth aerobically or anaerobically. 09/12/2016 1011   REPTSTATUS 09/17/2016 FINAL 09/12/2016 1011    Cardiac Enzymes: Recent Labs  Lab 11/20/16 2246 11/21/16 0325 11/21/16 1247  TROPONINI 0.95* 2.06* 3.12*   CBG: No results  for input(s): GLUCAP in the last 168 hours. Iron Studies: No results for input(s): IRON, TIBC, TRANSFERRIN, FERRITIN in the last 72 hours. @lablastinr3 @ Studies/Results: No results found. Medications: . sodium chloride    . argatroban 1 mcg/kg/min (11/22/16 2336)   . ambrisentan  5 mg Oral Daily  . aspirin EC  81 mg Oral Daily  . [START ON 11/28/2016] calcitRIOL  2 mcg Oral Q Tue-HD  . calcitRIOL  2 mcg Oral Q M,W,F-HD  . carvedilol  25 mg Oral BID WC  . cilostazol  100 mg Oral BID AC  . [START ON 11/28/2016] cinacalcet  120 mg Oral Q Tue-HD  . cinacalcet  120 mg Oral Q M,W,F-HD  . diltiazem  120 mg Oral Daily  . diphenhydrAMINE  50 mg Oral Q M,W,F  . feeding supplement (NEPRO CARB STEADY)  237 mL Oral BID BM  . ferric citrate  420 mg Oral TID WC  . hydrALAZINE  25 mg Oral BID  . isosorbide dinitrate  20 mg Oral TID  . levETIRAcetam  500 mg Oral Q2000  . levothyroxine  150 mcg Oral QAC breakfast  .  multivitamin  1 tablet Oral QHS  . pantoprazole  40 mg Oral Daily  . pravastatin  40 mg Oral QHS  . sodium chloride flush  3 mL Intravenous Q12H     Dialysis Orders: MWF [facility], AVG 4 hrs 180 NRe 450/Auto 1.5 77 kg 2.0 K/2.0 Ca UFP 4 -NO HEPARIN (severe allergy) -Mircera 60 mcg IV q 2 week (last dose 11/15/2016 Last HGB 10.7 11/15/16) -Calcitriol 2 mcg PO TIW (last PTH 333 10/25/16) -Sensipar 120 mg PO TIW  BMD meds: Auryxia 210 mg 2 tabs PO TID (last Ca 10.1 Phos 4.8 11/15/16)  Assessment/Plan: 1.  Chest pain: Troponin peak 3.12. Cardiology consulted. Cardiac cath showed diffuse dz favoring medical management 2.  AFib RVR: Back in SR. Diltiazem and coreg. Continues on argatroban. Will need long term anticoagulation upon DC- coumadin has not been started yet.  3.  ESRD -  MWF. HD in middle of night  via AVG. Next HD Friday- will do here if here tomorrow .  4.  Hypertension/volume: BP highly variable. Here now on hydral 25 TID/coreg 25 BID/cardizem 120/isordil. Slightly  agitated D/T being NPO which may be contributing. Got to EDW last tx 5.  Anemia  - HGB 10.6 Recent ESA dose as OP. Follow HGB.  6.  Metabolic bone disease -  Continue binders, VDRA- rocaltrol and auryxia  and sensipar Phos 4.9 Ca 9.2 7.  Nutrition - Albumin 2.9. renal diet with fld restrictions. Add nepro/renal vit 8. Dispo- she wants to go home- says will leave AMA- will send message to [facility] regarding coumadin   Corliss Parish, MD 11/23/2016

## 2016-11-23 NOTE — Discharge Summary (Signed)
Physician Discharge Summary  [name]  LSL:[id number]  DOB: 16-Jan-1945  DOA: 11/20/2016 PCP: Glendale Chard, MD  Admit date: 11/20/2016 Discharge date: 11/23/2016  Admitted From: Home  Disposition:  Home   Recommendations for Outpatient Follow-up:  1. Follow up with PCP in 1 week  2. Please obtain INR levels during HD  3. Follow up with cardiology in 1-2 weeks  4. Please obtain BMP/CBC in one week   Discharge Condition: Stable  CODE STATUS: Full  Diet recommendation: Heart Healthy / Renal   Brief/Interim Summary: For full details see H&P/progress notes but in brief, [name] is a 71 year old female with CAD, ESRD on hemodialysisMWF,anemia, hypertension, pulmonary hypertension, CVA presented with chest pain. In the ED, patient was found to have new onset atrial fibrillation with RVR. Trop 2.06,cardiology consulted forNSTEMI, She underwent cardiac cath, with severe diffuse CAD, no stent were placed during procedure. Medical therapy was recommended along with long term anticoagulation. Patient wanted to leave the hospital, so she was prescribed Warfarin 5 mg and dialysis unit will draw her labs for INR with HD. Patient was discharge home with close follow up as outpatient.   Subjective: Patient want to go home or will leave AMA. Denies any chest pain, SOB and palpitations.   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 Cardiology following. Cardizem switched to 120 mg PO daily. Will start warfarin today.   Patient want to leave the hospital, It was advised to stay so INR levels can be monitored, for therapeutic dose. I explain that she will be at risk for stroke until INR becomes therapeutic, she is aware of this, and understand the risk. State that she will leave anyway. Will give a 5 mg warfarin tonight, INR  levels to be check with HD 11/23. Adjustment to be done by PCP/Cardiology.    CAD/NSTEMI  Cardiac cath 11/21 reveal severe diffuse CAD. No stent were placed   Per cardiology note "In comparing images with prior study from 2013, perhaps continued medical therapy is best. The right coronary cannot be treated percutaneously due to ostial stent overhang. Left main/LAD/circumflex disease could be stented but would be complex and require indefinite dual antiplatelet therapy. Not sure that she is a surgical candidate given comorbidities and continued smoking"   For now cardiology recommending medical therapy as surgical option is high risk due to comorbidities  Continue Coreg, Hydralazine, Statin and Imdur  Norvasc d/ced   Tobacco abuse  Smoking cessation discussed - stated that she is going to quit today. No interested on nicotine patch   ESRD (end stage renal disease) on HD normal schedule MWF Continue HD per nephrology   History of pulmonary hypertension ContinueLetairis  History of seizure  Continue Keppra, currently stable  HTN (hypertension) - BP stable  Continue Coreg, hydralazine and isosorbide Amlodipine d/ced per cards   Chronic systolic CHF EF 11-57% on 2D echo 07/2015, PA pressure 86, TR EF on Cardiac cath 50%  No volume overload   All other chronic medical condition were stable during the hospitalization.  On the day of the discharge the patient's vitals were stable, and no other acute medical condition were reported by patient. the patient was felt safe to be discharge to home   Discharge Instructions  You were cared for by a hospitalist during your hospital stay. If you have any questions about your discharge medications  or the care you received while you were in the hospital after you are discharged, you can call the unit and asked to speak with the hospitalist on call if the hospitalist that took care of you is not available. Once you are discharged, your  primary care physician will handle any further medical issues. Please note that NO REFILLS for any discharge medications will be authorized once you are discharged, as it is imperative that you return to your primary care physician (or establish a relationship with a primary care physician if you do not have one) for your aftercare needs so that they can reassess your need for medications and monitor your lab values.  Discharge Instructions    Call MD for:  difficulty breathing, headache or visual disturbances   Complete by:  As directed    Call MD for:  extreme fatigue   Complete by:  As directed    Call MD for:  hives   Complete by:  As directed    Call MD for:  persistant dizziness or light-headedness   Complete by:  As directed    Call MD for:  persistant nausea and vomiting   Complete by:  As directed    Call MD for:  redness, tenderness, or signs of infection (pain, swelling, redness, odor or green/yellow discharge around incision site)   Complete by:  As directed    Call MD for:  severe uncontrolled pain   Complete by:  As directed    Call MD for:  temperature >100.4   Complete by:  As directed    Diet - low sodium heart healthy   Complete by:  As directed    Increase activity slowly   Complete by:  As directed      Allergies as of 11/23/2016      Reactions   Ace Inhibitors Anaphylaxis, Rash   Heparin Other (See Comments)   MDs told her not to take after reaction in ICU   Iohexol Swelling, Other (See Comments)   1970s; passed out and had facial/tongue swelling.  Requires 13-hour prep with prednisone and benadryl   Phenytoin Other (See Comments)   Had reaction while in ICU; doesn't know.  MDs told her not to take ever again.   Wellbutrin [bupropion] Other (See Comments)   seizures   Meperidine Hcl Swelling, Rash   Makes tongue swell   Morphine Rash   Penicillins Hives, Rash   Has patient had a PCN reaction causing immediate rash, facial/tongue/throat swelling, SOB or  lightheadedness with hypotension: Yes Has patient had a PCN reaction causing severe rash involving mucus membranes or skin necrosis: No Has patient had a PCN reaction that required hospitalization: No Has patient had a PCN reaction occurring within the last 10 years: No If all of the above answers are "NO", then may proceed with Cephalosporin use.   Valproic Acid And Related Other (See Comments)   Confusion   Iodinated Diagnostic Agents Other (See Comments)   Pre-meditate with benadryl and prednisone 3 times before appt.   Pentazocine Lactate Other (See Comments)   Patient does not remember reaction to this med (Talwin).      Medication List    STOP taking these medications   amLODipine 10 MG tablet Commonly known as:  NORVASC   aspirin EC 81 MG tablet   traMADol 50 MG tablet Commonly known as:  ULTRAM     TAKE these medications   acetaminophen 500 MG tablet Commonly known as:  TYLENOL Take 1,000  mg by mouth daily as needed for mild pain.   AURYXIA 1 GM 210 MG(Fe) tablet Generic drug:  ferric citrate Take 420 mg by mouth 3 (three) times daily with meals. And 1 tablet with snacks   carvedilol 25 MG tablet Commonly known as:  COREG Take 25 mg by mouth 2 (two) times daily with a meal.   cilostazol 100 MG tablet Commonly known as:  PLETAL Take 1 tablet (100 mg total) by mouth 2 (two) times daily before a meal.   diltiazem 120 MG 24 hr capsule Commonly known as:  CARDIZEM CD Take 1 capsule (120 mg total) by mouth daily. Start taking on:  11/24/2016   diphenhydrAMINE 50 MG capsule Commonly known as:  BENADRYL Take 1 capsule (50 mg total) by mouth every Monday, Wednesday, and Friday. On dialysis days   folic acid-vitamin b complex-vitamin c-selenium-zinc 3 MG Tabs tablet Take 1 tablet by mouth daily.   hydrALAZINE 25 MG tablet Commonly known as:  APRESOLINE Take 25 mg 2 (two) times daily by mouth.   isosorbide dinitrate 20 MG tablet Commonly known as:   ISORDIL Take 1 tablet (20 mg total) by mouth 3 (three) times daily.   LETAIRIS 5 MG tablet Generic drug:  ambrisentan TAKE 1 TABLET (5MG ) BY MOUTH DAILY. DO NOT HANDLE IF PREGNANT. DO NOTSPLIT, CRUSH, OR CHEW. AVOID INHALATION AND CONTACT WITH SKIN OR EYES.   levETIRAcetam 500 MG tablet Commonly known as:  KEPPRA Take 500 mg by mouth daily at 8 pm.   levothyroxine 150 MCG tablet Commonly known as:  SYNTHROID, LEVOTHROID Take 150 mcg by mouth daily before breakfast.   nitroGLYCERIN 0.4 MG SL tablet Commonly known as:  NITROSTAT Place 1 tablet (0.4 mg total) under the tongue every 5 (five) minutes as needed for chest pain.   omeprazole 20 MG tablet Commonly known as:  PRILOSEC OTC Take 20 mg by mouth daily.   oxyCODONE 5 MG immediate release tablet Commonly known as:  ROXICODONE Take 1 tablet (5 mg total) by mouth every 6 (six) hours as needed for severe pain.   pravastatin 40 MG tablet Commonly known as:  PRAVACHOL Take 1 tablet (40 mg total) by mouth at bedtime.   SENSIPAR 90 MG tablet Generic drug:  cinacalcet Take 90 mg by mouth every Monday, Wednesday, and Friday with hemodialysis.   traZODone 50 MG tablet Commonly known as:  DESYREL Take 50 mg by mouth at bedtime as needed for sleep.   warfarin 5 MG tablet Commonly known as:  COUMADIN Take 1 tablet (5 mg total) by mouth one time only at 6 PM.      Follow-up Information    Glendale Chard, MD. Schedule an appointment as soon as possible for a visit in 5 day(s).   Specialty:  Internal Medicine Why:  Hospital follow up  Contact information: [address] [place] [address] [phone]        Minus Breeding, MD. Schedule an appointment as soon as possible for a visit in 1 week(s).   Specialty:  Cardiology Why:  [facility] - anticoagulation management   Contact information: [address] [place] [address] [phone]          Allergies  Allergen Reactions  . Ace  Inhibitors Anaphylaxis and Rash  . Heparin Other (See Comments)    MDs told her not to take after reaction in ICU  . Iohexol Swelling and Other (See Comments)    1970s; passed out and had  facial/tongue swelling.  Requires 13-hour prep with prednisone and benadryl  . Phenytoin Other (See Comments)    Had reaction while in ICU; doesn't know.  MDs told her not to take ever again.  . Wellbutrin [Bupropion] Other (See Comments)    seizures  . Meperidine Hcl Swelling and Rash    Makes tongue swell  . Morphine Rash  . Penicillins Hives and Rash    Has patient had a PCN reaction causing immediate rash, facial/tongue/throat swelling, SOB or lightheadedness with hypotension: Yes Has patient had a PCN reaction causing severe rash involving mucus membranes or skin necrosis: No Has patient had a PCN reaction that required hospitalization: No Has patient had a PCN reaction occurring within the last 10 years: No If all of the above answers are "NO", then may proceed with Cephalosporin use.    . Valproic Acid And Related Other (See Comments)    Confusion   . Iodinated Diagnostic Agents Other (See Comments)    Pre-meditate with benadryl and prednisone 3 times before appt.  . Pentazocine Lactate Other (See Comments)    Patient does not remember reaction to this med (Talwin).     Consultations:  Cardiology   Nephrology    Procedures/Studies: Dg Chest 2 View  Result Date: 11/20/2016 CLINICAL DATA:  Chest pain for 1 day. EXAM: CHEST  2 VIEW COMPARISON:  09/03/2014 and older exams. FINDINGS: Cardiac silhouette is mildly enlarged. No mediastinal or hilar masses or evidence of adenopathy. There are thickened interstitial markings bilaterally similar to the most recent prior study. No lung consolidation. There are small bilateral pleural effusions. No pneumothorax. Skeletal structures are grossly intact. IMPRESSION: 1. Cardiomegaly, interstitial thickening and small pleural effusions. Findings  consistent with mild congestive heart failure with interstitial edema. Electronically Signed   By: Lajean Manes M.D.   On: 11/20/2016 18:06   Cardiac cath 11/22/16   Discharge Exam: Vitals:   11/22/16 2117 11/23/16 0437  BP: (!) 132/57 (!) 158/68  Pulse:  73  Resp:  16  Temp:  97.6 F (36.4 C)  SpO2:  93%   Vitals:   11/22/16 1625 11/22/16 1935 11/22/16 2117 11/23/16 0437  BP: (!) 159/65 (!) 82/72 (!) 132/57 (!) 158/68  Pulse: 76 72  73  Resp: 15 (!) 24  16  Temp: 98.3 F (36.8 C) 98 F (36.7 C)  97.6 F (36.4 C)  TempSrc: Oral Oral  Oral  SpO2: 96% 90%  93%  Weight:    76.7 kg (169 lb)  Height:        General: Pt is alert, awake, not in acute distress Cardiovascular: RRR, S1/S2 +, no rubs, no gallops Respiratory: CTA bilaterally, no wheezing, no rhonchi Abdominal: Soft, NT, ND, bowel sounds + Extremities: no edema, no cyanosis    The results of significant diagnostics from this hospitalization (including imaging, microbiology, ancillary and laboratory) are listed below for reference.     Microbiology: Recent Results (from the past 240 hour(s))  MRSA PCR Screening     Status: None   Collection Time: 11/22/16 12:45 AM  Result Value Ref Range Status   MRSA by PCR NEGATIVE NEGATIVE Final    Comment:        The GeneXpert MRSA Assay (FDA approved for NASAL specimens only), is one component of a comprehensive MRSA colonization surveillance program. It is not intended to diagnose MRSA infection nor to guide or monitor treatment for MRSA infections.      Labs: BNP (last 3 results) No results for  input(s): BNP in the last 8760 hours. Basic Metabolic Panel: Recent Labs  Lab 11/20/16 1653 11/20/16 1703 11/21/16 0325 11/21/16 1412 11/22/16 0530  NA 136 135 136  137 134* 134*  K 3.6 3.5 3.6  3.5 3.9 3.8  CL 95* 92* 95*  95* 95* 96*  CO2 27  --  28  32 28 28  GLUCOSE 90 94 80  84 105* 79  BUN 8 10 13  14 18  <5*  CREATININE 6.73* 6.60* 7.57*   7.58* 8.29* 3.50*  CALCIUM 9.3  --  9.0  9.1 9.1 9.2  PHOS  --   --  4.7* 4.9*  --    Liver Function Tests: Recent Labs  Lab 11/21/16 0325 11/21/16 1412  ALBUMIN 2.9* 2.9*   No results for input(s): LIPASE, AMYLASE in the last 168 hours. No results for input(s): AMMONIA in the last 168 hours. CBC: Recent Labs  Lab 11/20/16 1653 11/20/16 1703 11/21/16 0325 11/21/16 1412 11/22/16 0530 11/23/16 0443  WBC 3.2*  --  2.9* 3.4* 3.9* 3.9*  HGB 11.0* 12.2 10.4* 11.0* 10.6* 9.7*  HCT 35.2* 36.0 33.1* 35.1* 33.9* 30.7*  MCV 81.9  --  81.3 81.6 80.9 81.2  PLT 172  --  147* 138* 151 139*   Cardiac Enzymes: Recent Labs  Lab 11/20/16 2246 11/21/16 0325 11/21/16 1247  TROPONINI 0.95* 2.06* 3.12*   BNP: Invalid input(s): POCBNP CBG: No results for input(s): GLUCAP in the last 168 hours. D-Dimer No results for input(s): DDIMER in the last 72 hours. Hgb A1c No results for input(s): HGBA1C in the last 72 hours. Lipid Profile No results for input(s): CHOL, HDL, LDLCALC, TRIG, CHOLHDL, LDLDIRECT in the last 72 hours. Thyroid function studies Recent Labs    11/20/16 2355  TSH 1.701   Anemia work up No results for input(s): VITAMINB12, FOLATE, FERRITIN, TIBC, IRON, RETICCTPCT in the last 72 hours. Urinalysis    Component Value Date/Time   COLORURINE YELLOW 09/03/2014 0018   APPEARANCEUR CLOUDY (A) 09/03/2014 0018   LABSPEC 1.010 09/03/2014 0018   PHURINE 5.5 09/03/2014 0018   GLUCOSEU 250 (A) 09/03/2014 0018   HGBUR TRACE (A) 09/03/2014 0018   BILIRUBINUR NEGATIVE 09/03/2014 0018   KETONESUR NEGATIVE 09/03/2014 0018   PROTEINUR 100 (A) 09/03/2014 0018   UROBILINOGEN 0.2 09/03/2014 0018   NITRITE NEGATIVE 09/03/2014 0018   LEUKOCYTESUR NEGATIVE 09/03/2014 0018   Sepsis Labs Invalid input(s): PROCALCITONIN,  WBC,  LACTICIDVEN Microbiology Recent Results (from the past 240 hour(s))  MRSA PCR Screening     Status: None   Collection Time: 11/22/16 12:45 AM  Result  Value Ref Range Status   MRSA by PCR NEGATIVE NEGATIVE Final    Comment:        The GeneXpert MRSA Assay (FDA approved for NASAL specimens only), is one component of a comprehensive MRSA colonization surveillance program. It is not intended to diagnose MRSA infection nor to guide or monitor treatment for MRSA infections.      Time coordinating discharge: 32 minutes  SIGNED:  Chipper Oman, MD  [facility] 11/23/2016, 11:41 AM  Pager please text page via  [email or web address]

## 2016-11-23 NOTE — Progress Notes (Signed)
Progress Note  Patient Name: [name] Date of Encounter: 11/23/2016  Primary Cardiologist: Hochrein  Subjective   No cardiac complaints.  We discussed the very deleterious impact of cigarette smoking in the setting of PAD and progressive coronary disease.  Inpatient Medications    Scheduled Meds: . ambrisentan  5 mg Oral Daily  . aspirin EC  81 mg Oral Daily  . [START ON 11/28/2016] calcitRIOL  2 mcg Oral Q Tue-HD  . calcitRIOL  2 mcg Oral Q M,W,F-HD  . carvedilol  25 mg Oral BID WC  . cilostazol  100 mg Oral BID AC  . [START ON 11/28/2016] cinacalcet  120 mg Oral Q Tue-HD  . cinacalcet  120 mg Oral Q M,W,F-HD  . diltiazem  120 mg Oral Daily  . diphenhydrAMINE  50 mg Oral Q M,W,F  . feeding supplement (NEPRO CARB STEADY)  237 mL Oral BID BM  . ferric citrate  420 mg Oral TID WC  . hydrALAZINE  25 mg Oral BID  . isosorbide dinitrate  20 mg Oral TID  . levETIRAcetam  500 mg Oral Q2000  . levothyroxine  150 mcg Oral QAC breakfast  . multivitamin  1 tablet Oral QHS  . pantoprazole  40 mg Oral Daily  . pravastatin  40 mg Oral QHS  . sodium chloride flush  3 mL Intravenous Q12H   Continuous Infusions: . sodium chloride    . argatroban 1 mcg/kg/min (11/22/16 2336)   PRN Meds: sodium chloride, acetaminophen, diphenhydrAMINE, nitroGLYCERIN, ondansetron **OR** ondansetron (ZOFRAN) IV, oxyCODONE, sodium chloride flush, traZODone   Vital Signs    Vitals:   11/22/16 1625 11/22/16 1935 11/22/16 2117 11/23/16 0437  BP: (!) 159/65 (!) 82/72 (!) 132/57 (!) 158/68  Pulse: 76 72  73  Resp: 15 (!) 24  16  Temp: 98.3 F (36.8 C) 98 F (36.7 C)  97.6 F (36.4 C)  TempSrc: Oral Oral  Oral  SpO2: 96% 90%  93%  Weight:    169 lb (76.7 kg)  Height:        Intake/Output Summary (Last 24 hours) at 11/23/2016 0826 Last data filed at 11/23/2016 0655 Gross per 24 hour  Intake 634.39 ml  Output -  Net 634.39 ml   Filed Weights   11/21/16 2335 11/22/16 0342 11/23/16 0437    Weight: 170 lb 6.7 oz (77.3 kg) 168 lb 3.4 oz (76.3 kg) 169 lb (76.7 kg)    Telemetry    Rhythm is difficult to ascertain.  May be sinus but could also be flutter with controlled response.- Personally Reviewed  ECG    No tracing yet today.  Will have repeated.- Personally Reviewed  Physical Exam  Sitting at bedside. GEN: No acute distress.   Neck: No JVD Cardiac: RRR, no murmurs, rubs, or gallops.  Cath site is unremarkable. Respiratory: Clear to auscultation bilaterally. GI: Soft, nontender, non-distended  MS: No edema; No deformity.  No evidence of access site hematoma or complication Neuro:  Nonfocal  Psych: Normal affect   Labs    Chemistry Recent Labs  Lab 11/21/16 0325 11/21/16 1412 11/22/16 0530  NA 136  137 134* 134*  K 3.6  3.5 3.9 3.8  CL 95*  95* 95* 96*  CO2 28  32 28 28  GLUCOSE 80  84 105* 79  BUN 13  14 18  <5*  CREATININE 7.57*  7.58* 8.29* 3.50*  CALCIUM 9.0  9.1 9.1 9.2  ALBUMIN 2.9* 2.9*  --   GFRNONAA 5*  5* 4* 12*  GFRAA 6*  5* 5* 14*  ANIONGAP 13  10 11 10      Hematology Recent Labs  Lab 11/21/16 1412 11/22/16 0530 11/23/16 0443  WBC 3.4* 3.9* 3.9*  RBC 4.30 4.19 3.78*  HGB 11.0* 10.6* 9.7*  HCT 35.1* 33.9* 30.7*  MCV 81.6 80.9 81.2  MCH 25.6* 25.3* 25.7*  MCHC 31.3 31.3 31.6  RDW 17.1* 17.5* 17.3*  PLT 138* 151 139*    Cardiac Enzymes Recent Labs  Lab 11/20/16 2246 11/21/16 0325 11/21/16 1247  TROPONINI 0.95* 2.06* 3.12*    Recent Labs  Lab 11/20/16 1701  TROPIPOC 0.06     BNPNo results for input(s): BNP, PROBNP in the last 168 hours.   DDimer No results for input(s): DDIMER in the last 168 hours.   Radiology    No results found.  Cardiac Studies   Cardiac catheterization results, 11/23/2016: Conclusion    Severe diffuse calcific coronary artery disease.  Relatively focal distal left main of 40-50% stenosis.  Ostial circumflex 75-85%, forming a [place] 101 bifurcation stenosis with the LAD  and left main.  75% distal circumflex.  Large, widely patent LAD.  Ostial RCA stent that overhangs into the ascending aorta.  Selective engagement was not possible.  Severe proximal to mid disease noted with images obtained.  LV is underfilled on hand injection, LVEDP is 25 mmHg, estimated overall EF is 50%.  Findings are compatible with chronic combined diastolic heart failure.   RECOMMENDATIONS:    In comparing images with prior study from 2013, perhaps continued medical therapy is best.  The right coronary cannot be treated percutaneously due to ostial stent overhang.  Left main/LAD/circumflex disease could be stented but would be complex and require indefinite dual antiplatelet therapy.  Not sure that she is a surgical candidate given comorbidities and continued smoking.  Heart team approach.   Coronary Diagrams   Diagnostic Diagram            Patient Profile     71 y.o. female with history of CAD, ESRD on HD, prior CVA, HLD, HTN admitted with chest pain and found to have atrial fib with RVR and elevated troponin c/w a NSTEMI    Assessment & Plan    1.  Coronary atherosclerotic heart disease with non-ST elevation myocardial infarction.  Please see coronary anatomy above.  Has high-grade disease in the right coronary which cannot be selectively engaged due to a stent placed many years ago that extends into the ascending aorta.  Also has moderate to severe distal left main and ostial circumflex disease which should be treated with medical therapy for the time being but may require CABG or intervention at some point in the future.  Her long-term prognosis from a cardiovascular risk standpoint is very poor as she continues to smoke, has end-stage renal disease, and diffuse vascular disease including PAD that prevents support apparatus if left main intervention is attempted.  Overall, I believe the best option currently is to continue medical therapy.  If refractory symptoms, she  should have surgical consultation (she will probably not be a surgical candidate and she seems to be negative concerning that as a possible treatment strategy).  Medical therapy will be difficult in her situation because of dialysis and impact that therapy could have all blood pressure. 2.  Atrial fibrillation which now seems to have reverted to a regular rhythm, probably sinus, rule out atrial flutter with 3 or 4-1 AV conduction.  She has high risk for  embolic stroke.  She has a history of HIT.  Will need to be converted to Coumadin therapy for stroke prevention.  Repeat EKG this morning.  Overall this is not a good clinical situation.  A poor outcome is anticipated given her multitude of comorbidities.  We will follow and help as possible.  For questions or updates, please contact [place] Please consult [email or web address] for contact info under Cardiology/STEMI.      Signed, Sinclair Grooms, MD  11/23/2016, 8:26 AM

## 2016-11-23 NOTE — Progress Notes (Signed)
  Echocardiogram 2D Echocardiogram has been performed.  Jennette Dubin 11/23/2016, 9:22 AM

## 2016-11-23 NOTE — Discharge Instructions (Signed)

## 2016-11-23 NOTE — Progress Notes (Signed)
ANTICOAGULATION CONSULT NOTE - Initial Consult  Pharmacy Consult for warfarin Indication: atrial fibrillation  Allergies  Allergen Reactions  . Ace Inhibitors Anaphylaxis and Rash  . Heparin Other (See Comments)    MDs told her not to take after reaction in ICU  . Iohexol Swelling and Other (See Comments)    1970s; passed out and had facial/tongue swelling.  Requires 13-hour prep with prednisone and benadryl  . Phenytoin Other (See Comments)    Had reaction while in ICU; doesn't know.  MDs told her not to take ever again.  . Wellbutrin [Bupropion] Other (See Comments)    seizures  . Meperidine Hcl Swelling and Rash    Makes tongue swell  . Morphine Rash  . Penicillins Hives and Rash    Has patient had a PCN reaction causing immediate rash, facial/tongue/throat swelling, SOB or lightheadedness with hypotension: Yes Has patient had a PCN reaction causing severe rash involving mucus membranes or skin necrosis: No Has patient had a PCN reaction that required hospitalization: No Has patient had a PCN reaction occurring within the last 10 years: No If all of the above answers are "NO", then may proceed with Cephalosporin use.    . Valproic Acid And Related Other (See Comments)    Confusion   . Iodinated Diagnostic Agents Other (See Comments)    Pre-meditate with benadryl and prednisone 3 times before appt.  . Pentazocine Lactate Other (See Comments)    Patient does not remember reaction to this med ([place]).     Patient Measurements: Height: 5' 2.5" (158.8 cm) Weight: 169 lb (76.7 kg) IBW/kg (Calculated) : 51.25  Vital Signs: Temp: 97.6 F (36.4 C) (11/22 0437) Temp Source: Oral (11/22 0437) BP: 158/68 (11/22 0437) Pulse Rate: 73 (11/22 0437)  Labs: Recent Labs    11/20/16 2246 11/21/16 0325  11/21/16 1247 11/21/16 1412  11/22/16 0530 11/23/16 0137 11/23/16 0443  HGB  --  10.4*  --   --  11.0*  --  10.6*  --  9.7*  HCT  --  33.1*  --   --  35.1*  --  33.9*  --   30.7*  PLT  --  147*  --   --  138*  --  151  --  139*  APTT  --  63*   < >  --   --    < > 59* 67* 72*  LABPROT  --   --   --   --   --   --  19.8*  --   --   INR  --   --   --   --   --   --  1.70  --   --   CREATININE  --  7.57*  7.58*  --   --  8.29*  --  3.50*  --   --   TROPONINI 0.95* 2.06*  --  3.12*  --   --   --   --   --    < > = values in this interval not displayed.    Estimated Creatinine Clearance: 14.1 mL/min (A) (by C-G formula based on SCr of 3.5 mg/dL (H)).   Medical History: Past Medical History:  Diagnosis Date  . Anemia    History  of Blood transfusion  . Atrial fibrillation with RVR ([place]) 11/2016  . CAD (coronary artery disease)    stent to RCA  . Cancer ([place])    clear cell cancer, kidney  . Chronic renal  insufficiency    On hemodialysis  . Complication of anesthesia 12/2010   pt is very confused, with AMS with anesthesia  . CVA 07/27/2008   CVA affected cognition and memory per family, no focal deficits.    . CVA (cerebral infarction) 2003   no apparent residual  . Diastolic congestive heart failure ([place])   . Dyslipidemia   . Encephalopathy   . ESRD 07/27/2008   ESRD due to HTN and NSAID's, started hemodialysis in 2005 in [place], [place]. Went to [facility] from 2010 to 2012 and since 2012 has been getting dialysis at [facility] on [address] in [place] on a MWF schedule. First access with RUA AVG placed in [place]. Next and current access was LUA AVG placed by Dr. Lucky Cowboy in [place] in or around 2012. Has had 2 or 3 procedures on that graft since placed per family. She gets her access work done here in [place] now. She is allergic to heparin and does not get any heparin at dialysis; she had an allergic reaction apparently when in ICU in the past.      . GERD (gastroesophageal reflux disease)   . Hyperlipidemia   . Hypertension   . Hypothyroidism   . Positive PPD    completed rifampin  . Pulmonary hypertension ([place])   . PVD (peripheral  vascular disease) ([place])   . Seizures ([place])    last seizure 6 years ago  . Sleep apnea    Sleep Study 2008  . Traumatic seroma of left lower leg     Medications:  -no major DDIs identified  Assessment:  71yo female c/o chest discomfort and weakness, found to be in Afib w/ RVR improved on diltiazem. Now post-cath restarted on Argatroban (d/t heparin allergy) 8h after sheath removal.  Argatroban at 80mcg/kg/min  and aPTT in range at 63 seconds.  Hgb 10.6, plts 151- no overt bleeding note from sheath site  No PTA anticoagulation meds.  Goal of Therapy:  INR 2-3 aPTT 50-90 seconds Monitor platelets by anticoagulation protocol: Yes   Plan:  Warfarin 5 mg tonight x 1 Continue argatroban 1 mcg/kg/min Daily aPTT, INR  [name] 11/23/2016,10:34 AM

## 2016-11-24 ENCOUNTER — Encounter (HOSPITAL_COMMUNITY): Payer: Self-pay | Admitting: Interventional Cardiology

## 2016-11-24 DIAGNOSIS — D631 Anemia in chronic kidney disease: Secondary | ICD-10-CM | POA: Diagnosis not present

## 2016-11-24 DIAGNOSIS — E119 Type 2 diabetes mellitus without complications: Secondary | ICD-10-CM | POA: Diagnosis not present

## 2016-11-24 DIAGNOSIS — N186 End stage renal disease: Secondary | ICD-10-CM | POA: Diagnosis not present

## 2016-11-24 DIAGNOSIS — D509 Iron deficiency anemia, unspecified: Secondary | ICD-10-CM | POA: Diagnosis not present

## 2016-11-24 DIAGNOSIS — N2581 Secondary hyperparathyroidism of renal origin: Secondary | ICD-10-CM | POA: Diagnosis not present

## 2016-11-27 DIAGNOSIS — N186 End stage renal disease: Secondary | ICD-10-CM | POA: Diagnosis not present

## 2016-11-27 DIAGNOSIS — E119 Type 2 diabetes mellitus without complications: Secondary | ICD-10-CM | POA: Diagnosis not present

## 2016-11-27 DIAGNOSIS — N2581 Secondary hyperparathyroidism of renal origin: Secondary | ICD-10-CM | POA: Diagnosis not present

## 2016-11-27 DIAGNOSIS — D509 Iron deficiency anemia, unspecified: Secondary | ICD-10-CM | POA: Diagnosis not present

## 2016-11-27 DIAGNOSIS — D631 Anemia in chronic kidney disease: Secondary | ICD-10-CM | POA: Diagnosis not present

## 2016-11-29 DIAGNOSIS — I482 Chronic atrial fibrillation: Secondary | ICD-10-CM | POA: Diagnosis not present

## 2016-11-29 DIAGNOSIS — D631 Anemia in chronic kidney disease: Secondary | ICD-10-CM | POA: Diagnosis not present

## 2016-11-29 DIAGNOSIS — N186 End stage renal disease: Secondary | ICD-10-CM | POA: Diagnosis not present

## 2016-11-29 DIAGNOSIS — N2581 Secondary hyperparathyroidism of renal origin: Secondary | ICD-10-CM | POA: Diagnosis not present

## 2016-11-29 DIAGNOSIS — D509 Iron deficiency anemia, unspecified: Secondary | ICD-10-CM | POA: Diagnosis not present

## 2016-11-29 DIAGNOSIS — E119 Type 2 diabetes mellitus without complications: Secondary | ICD-10-CM | POA: Diagnosis not present

## 2016-11-29 LAB — PROTIME-INR: INR: 1.3 — AB (ref <=1.1)

## 2016-12-01 ENCOUNTER — Ambulatory Visit: Payer: Medicare Other | Admitting: Vascular Surgery

## 2016-12-01 ENCOUNTER — Encounter (HOSPITAL_COMMUNITY): Payer: Medicare Other

## 2016-12-01 DIAGNOSIS — E1129 Type 2 diabetes mellitus with other diabetic kidney complication: Secondary | ICD-10-CM | POA: Diagnosis not present

## 2016-12-01 DIAGNOSIS — N186 End stage renal disease: Secondary | ICD-10-CM | POA: Diagnosis not present

## 2016-12-01 DIAGNOSIS — Z992 Dependence on renal dialysis: Secondary | ICD-10-CM | POA: Diagnosis not present

## 2016-12-02 DIAGNOSIS — N186 End stage renal disease: Secondary | ICD-10-CM | POA: Diagnosis not present

## 2016-12-02 DIAGNOSIS — N2581 Secondary hyperparathyroidism of renal origin: Secondary | ICD-10-CM | POA: Diagnosis not present

## 2016-12-02 DIAGNOSIS — D509 Iron deficiency anemia, unspecified: Secondary | ICD-10-CM | POA: Diagnosis not present

## 2016-12-02 DIAGNOSIS — E119 Type 2 diabetes mellitus without complications: Secondary | ICD-10-CM | POA: Diagnosis not present

## 2016-12-02 DIAGNOSIS — D631 Anemia in chronic kidney disease: Secondary | ICD-10-CM | POA: Diagnosis not present

## 2016-12-04 DIAGNOSIS — N186 End stage renal disease: Secondary | ICD-10-CM | POA: Diagnosis not present

## 2016-12-04 DIAGNOSIS — N2581 Secondary hyperparathyroidism of renal origin: Secondary | ICD-10-CM | POA: Diagnosis not present

## 2016-12-04 DIAGNOSIS — D631 Anemia in chronic kidney disease: Secondary | ICD-10-CM | POA: Diagnosis not present

## 2016-12-04 DIAGNOSIS — E119 Type 2 diabetes mellitus without complications: Secondary | ICD-10-CM | POA: Diagnosis not present

## 2016-12-04 DIAGNOSIS — D509 Iron deficiency anemia, unspecified: Secondary | ICD-10-CM | POA: Diagnosis not present

## 2016-12-06 ENCOUNTER — Telehealth: Payer: Self-pay | Admitting: Pharmacist

## 2016-12-06 DIAGNOSIS — E119 Type 2 diabetes mellitus without complications: Secondary | ICD-10-CM | POA: Diagnosis not present

## 2016-12-06 DIAGNOSIS — N2581 Secondary hyperparathyroidism of renal origin: Secondary | ICD-10-CM | POA: Diagnosis not present

## 2016-12-06 DIAGNOSIS — N186 End stage renal disease: Secondary | ICD-10-CM | POA: Diagnosis not present

## 2016-12-06 DIAGNOSIS — I482 Chronic atrial fibrillation: Secondary | ICD-10-CM | POA: Diagnosis not present

## 2016-12-06 DIAGNOSIS — D631 Anemia in chronic kidney disease: Secondary | ICD-10-CM | POA: Diagnosis not present

## 2016-12-06 DIAGNOSIS — D509 Iron deficiency anemia, unspecified: Secondary | ICD-10-CM | POA: Diagnosis not present

## 2016-12-06 NOTE — Telephone Encounter (Signed)
Patient to establish with coumadin clinic (Dr Percival Spanish)  Last INR done at dialysis on 11/29/2016 = 1.3    [facility]' patient to call back  For new coumadin appnt

## 2016-12-07 NOTE — Telephone Encounter (Signed)
2nd message; Need follow up with coumadin clinic.

## 2016-12-08 ENCOUNTER — Ambulatory Visit (INDEPENDENT_AMBULATORY_CARE_PROVIDER_SITE_OTHER): Payer: Medicare Other | Admitting: Pharmacist

## 2016-12-08 DIAGNOSIS — D631 Anemia in chronic kidney disease: Secondary | ICD-10-CM | POA: Diagnosis not present

## 2016-12-08 DIAGNOSIS — D509 Iron deficiency anemia, unspecified: Secondary | ICD-10-CM | POA: Diagnosis not present

## 2016-12-08 DIAGNOSIS — E119 Type 2 diabetes mellitus without complications: Secondary | ICD-10-CM | POA: Diagnosis not present

## 2016-12-08 DIAGNOSIS — N186 End stage renal disease: Secondary | ICD-10-CM | POA: Diagnosis not present

## 2016-12-08 DIAGNOSIS — I4891 Unspecified atrial fibrillation: Secondary | ICD-10-CM

## 2016-12-08 DIAGNOSIS — N2581 Secondary hyperparathyroidism of renal origin: Secondary | ICD-10-CM | POA: Diagnosis not present

## 2016-12-08 DIAGNOSIS — Z7901 Long term (current) use of anticoagulants: Secondary | ICD-10-CM

## 2016-12-08 NOTE — Telephone Encounter (Signed)
Talked to patient today 12/7 @ 4:40pm.  Patient was on warfarin before and is very familial with medication. She is unable to come to clinic frequently due to transportation issues.  .  A benefit risk analysis has been presented to the patient, so that they understand the justification for choosing anticoagulation at this time. The need for frequent and regular monitoring, precise dosage adjustment and compliance is stressed.  Side effects of potential bleeding are discussed.  Call if any signs of abnormal bleeding.  Next PT/INR test in 12/13/2016 at dialysis; telephone follow up thereafter.  Orders sent to [facility]

## 2016-12-12 DIAGNOSIS — E119 Type 2 diabetes mellitus without complications: Secondary | ICD-10-CM | POA: Diagnosis not present

## 2016-12-12 DIAGNOSIS — D631 Anemia in chronic kidney disease: Secondary | ICD-10-CM | POA: Diagnosis not present

## 2016-12-12 DIAGNOSIS — D509 Iron deficiency anemia, unspecified: Secondary | ICD-10-CM | POA: Diagnosis not present

## 2016-12-12 DIAGNOSIS — N186 End stage renal disease: Secondary | ICD-10-CM | POA: Diagnosis not present

## 2016-12-12 DIAGNOSIS — N2581 Secondary hyperparathyroidism of renal origin: Secondary | ICD-10-CM | POA: Diagnosis not present

## 2016-12-15 ENCOUNTER — Encounter (HOSPITAL_COMMUNITY): Payer: Medicare Other

## 2016-12-15 ENCOUNTER — Ambulatory Visit: Payer: Medicare Other | Admitting: Vascular Surgery

## 2016-12-15 DIAGNOSIS — D631 Anemia in chronic kidney disease: Secondary | ICD-10-CM | POA: Diagnosis not present

## 2016-12-15 DIAGNOSIS — I482 Chronic atrial fibrillation: Secondary | ICD-10-CM | POA: Diagnosis not present

## 2016-12-15 DIAGNOSIS — D509 Iron deficiency anemia, unspecified: Secondary | ICD-10-CM | POA: Diagnosis not present

## 2016-12-15 DIAGNOSIS — E119 Type 2 diabetes mellitus without complications: Secondary | ICD-10-CM | POA: Diagnosis not present

## 2016-12-15 DIAGNOSIS — N186 End stage renal disease: Secondary | ICD-10-CM | POA: Diagnosis not present

## 2016-12-15 DIAGNOSIS — N2581 Secondary hyperparathyroidism of renal origin: Secondary | ICD-10-CM | POA: Diagnosis not present

## 2016-12-18 DIAGNOSIS — D631 Anemia in chronic kidney disease: Secondary | ICD-10-CM | POA: Diagnosis not present

## 2016-12-18 DIAGNOSIS — N2581 Secondary hyperparathyroidism of renal origin: Secondary | ICD-10-CM | POA: Diagnosis not present

## 2016-12-18 DIAGNOSIS — E119 Type 2 diabetes mellitus without complications: Secondary | ICD-10-CM | POA: Diagnosis not present

## 2016-12-18 DIAGNOSIS — N186 End stage renal disease: Secondary | ICD-10-CM | POA: Diagnosis not present

## 2016-12-18 DIAGNOSIS — D509 Iron deficiency anemia, unspecified: Secondary | ICD-10-CM | POA: Diagnosis not present

## 2016-12-19 ENCOUNTER — Ambulatory Visit (INDEPENDENT_AMBULATORY_CARE_PROVIDER_SITE_OTHER): Payer: Medicare Other | Admitting: Pharmacist

## 2016-12-19 DIAGNOSIS — I4891 Unspecified atrial fibrillation: Secondary | ICD-10-CM

## 2016-12-19 DIAGNOSIS — Z7901 Long term (current) use of anticoagulants: Secondary | ICD-10-CM | POA: Diagnosis not present

## 2016-12-19 LAB — POCT INR: INR: 1.2

## 2016-12-19 MED ORDER — WARFARIN SODIUM 5 MG PO TABS: ORAL_TABLET | ORAL | 1 refills | Status: DC

## 2016-12-20 DIAGNOSIS — E119 Type 2 diabetes mellitus without complications: Secondary | ICD-10-CM | POA: Diagnosis not present

## 2016-12-20 DIAGNOSIS — I482 Chronic atrial fibrillation: Secondary | ICD-10-CM | POA: Diagnosis not present

## 2016-12-20 DIAGNOSIS — D509 Iron deficiency anemia, unspecified: Secondary | ICD-10-CM | POA: Diagnosis not present

## 2016-12-20 DIAGNOSIS — D631 Anemia in chronic kidney disease: Secondary | ICD-10-CM | POA: Diagnosis not present

## 2016-12-20 DIAGNOSIS — N186 End stage renal disease: Secondary | ICD-10-CM | POA: Diagnosis not present

## 2016-12-20 DIAGNOSIS — N2581 Secondary hyperparathyroidism of renal origin: Secondary | ICD-10-CM | POA: Diagnosis not present

## 2016-12-23 DIAGNOSIS — E119 Type 2 diabetes mellitus without complications: Secondary | ICD-10-CM | POA: Diagnosis not present

## 2016-12-23 DIAGNOSIS — D509 Iron deficiency anemia, unspecified: Secondary | ICD-10-CM | POA: Diagnosis not present

## 2016-12-23 DIAGNOSIS — N2581 Secondary hyperparathyroidism of renal origin: Secondary | ICD-10-CM | POA: Diagnosis not present

## 2016-12-23 DIAGNOSIS — N186 End stage renal disease: Secondary | ICD-10-CM | POA: Diagnosis not present

## 2016-12-23 DIAGNOSIS — D631 Anemia in chronic kidney disease: Secondary | ICD-10-CM | POA: Diagnosis not present

## 2016-12-27 DIAGNOSIS — R05 Cough: Secondary | ICD-10-CM | POA: Diagnosis not present

## 2016-12-27 DIAGNOSIS — N2581 Secondary hyperparathyroidism of renal origin: Secondary | ICD-10-CM | POA: Diagnosis not present

## 2016-12-27 DIAGNOSIS — E119 Type 2 diabetes mellitus without complications: Secondary | ICD-10-CM | POA: Diagnosis not present

## 2016-12-27 DIAGNOSIS — D631 Anemia in chronic kidney disease: Secondary | ICD-10-CM | POA: Diagnosis not present

## 2016-12-27 DIAGNOSIS — N186 End stage renal disease: Secondary | ICD-10-CM | POA: Diagnosis not present

## 2016-12-27 DIAGNOSIS — I482 Chronic atrial fibrillation: Secondary | ICD-10-CM | POA: Diagnosis not present

## 2016-12-27 DIAGNOSIS — D509 Iron deficiency anemia, unspecified: Secondary | ICD-10-CM | POA: Diagnosis not present

## 2016-12-29 ENCOUNTER — Ambulatory Visit (INDEPENDENT_AMBULATORY_CARE_PROVIDER_SITE_OTHER): Payer: Medicare Other | Admitting: Pharmacist Clinician (PhC)/ Clinical Pharmacy Specialist

## 2016-12-29 DIAGNOSIS — N186 End stage renal disease: Secondary | ICD-10-CM | POA: Diagnosis not present

## 2016-12-29 DIAGNOSIS — Z7901 Long term (current) use of anticoagulants: Secondary | ICD-10-CM | POA: Diagnosis not present

## 2016-12-29 DIAGNOSIS — D631 Anemia in chronic kidney disease: Secondary | ICD-10-CM | POA: Diagnosis not present

## 2016-12-29 DIAGNOSIS — N2581 Secondary hyperparathyroidism of renal origin: Secondary | ICD-10-CM | POA: Diagnosis not present

## 2016-12-29 DIAGNOSIS — E119 Type 2 diabetes mellitus without complications: Secondary | ICD-10-CM | POA: Diagnosis not present

## 2016-12-29 DIAGNOSIS — I4891 Unspecified atrial fibrillation: Secondary | ICD-10-CM | POA: Diagnosis not present

## 2016-12-29 DIAGNOSIS — D509 Iron deficiency anemia, unspecified: Secondary | ICD-10-CM | POA: Diagnosis not present

## 2016-12-29 LAB — POCT INR: INR: 1.2

## 2016-12-31 DIAGNOSIS — E119 Type 2 diabetes mellitus without complications: Secondary | ICD-10-CM | POA: Diagnosis not present

## 2016-12-31 DIAGNOSIS — D631 Anemia in chronic kidney disease: Secondary | ICD-10-CM | POA: Diagnosis not present

## 2016-12-31 DIAGNOSIS — N186 End stage renal disease: Secondary | ICD-10-CM | POA: Diagnosis not present

## 2016-12-31 DIAGNOSIS — N2581 Secondary hyperparathyroidism of renal origin: Secondary | ICD-10-CM | POA: Diagnosis not present

## 2016-12-31 DIAGNOSIS — D509 Iron deficiency anemia, unspecified: Secondary | ICD-10-CM | POA: Diagnosis not present

## 2017-01-01 DIAGNOSIS — E1129 Type 2 diabetes mellitus with other diabetic kidney complication: Secondary | ICD-10-CM | POA: Diagnosis not present

## 2017-01-01 DIAGNOSIS — Z992 Dependence on renal dialysis: Secondary | ICD-10-CM | POA: Diagnosis not present

## 2017-01-01 DIAGNOSIS — N186 End stage renal disease: Secondary | ICD-10-CM | POA: Diagnosis not present

## 2017-01-03 DIAGNOSIS — D631 Anemia in chronic kidney disease: Secondary | ICD-10-CM | POA: Diagnosis not present

## 2017-01-03 DIAGNOSIS — N186 End stage renal disease: Secondary | ICD-10-CM | POA: Diagnosis not present

## 2017-01-03 DIAGNOSIS — E119 Type 2 diabetes mellitus without complications: Secondary | ICD-10-CM | POA: Diagnosis not present

## 2017-01-03 DIAGNOSIS — D509 Iron deficiency anemia, unspecified: Secondary | ICD-10-CM | POA: Diagnosis not present

## 2017-01-03 DIAGNOSIS — N2581 Secondary hyperparathyroidism of renal origin: Secondary | ICD-10-CM | POA: Diagnosis not present

## 2017-01-05 ENCOUNTER — Encounter (HOSPITAL_COMMUNITY): Payer: Self-pay | Admitting: Emergency Medicine

## 2017-01-05 ENCOUNTER — Ambulatory Visit (INDEPENDENT_AMBULATORY_CARE_PROVIDER_SITE_OTHER): Payer: Medicare Other

## 2017-01-05 ENCOUNTER — Ambulatory Visit (HOSPITAL_COMMUNITY)
Admission: EM | Admit: 2017-01-05 | Discharge: 2017-01-05 | Disposition: A | Discharge status: Home / Self Care | Payer: Medicare Other | Attending: Emergency Medicine | Admitting: Emergency Medicine

## 2017-01-05 DIAGNOSIS — D509 Iron deficiency anemia, unspecified: Secondary | ICD-10-CM | POA: Diagnosis not present

## 2017-01-05 DIAGNOSIS — D631 Anemia in chronic kidney disease: Secondary | ICD-10-CM | POA: Diagnosis not present

## 2017-01-05 DIAGNOSIS — N186 End stage renal disease: Secondary | ICD-10-CM | POA: Diagnosis not present

## 2017-01-05 DIAGNOSIS — J40 Bronchitis, not specified as acute or chronic: Secondary | ICD-10-CM | POA: Diagnosis not present

## 2017-01-05 DIAGNOSIS — J441 Chronic obstructive pulmonary disease with (acute) exacerbation: Secondary | ICD-10-CM | POA: Diagnosis not present

## 2017-01-05 DIAGNOSIS — N2581 Secondary hyperparathyroidism of renal origin: Secondary | ICD-10-CM | POA: Diagnosis not present

## 2017-01-05 DIAGNOSIS — E119 Type 2 diabetes mellitus without complications: Secondary | ICD-10-CM | POA: Diagnosis not present

## 2017-01-05 DIAGNOSIS — R059 Cough, unspecified: Secondary | ICD-10-CM

## 2017-01-05 DIAGNOSIS — R05 Cough: Secondary | ICD-10-CM

## 2017-01-05 DIAGNOSIS — I517 Cardiomegaly: Secondary | ICD-10-CM | POA: Diagnosis not present

## 2017-01-05 MED ORDER — METHYLPREDNISOLONE SODIUM SUCC 125 MG IJ SOLR
125.0000 mg | Freq: Once | INTRAMUSCULAR | Status: AC
  Administered 2017-01-05: 125 mg via INTRAVENOUS

## 2017-01-05 MED ORDER — METHYLPREDNISOLONE SODIUM SUCC 125 MG IJ SOLR
INTRAMUSCULAR | Status: AC
  Filled 2017-01-05: qty 2

## 2017-01-05 MED ORDER — ALBUTEROL SULFATE HFA 108 (90 BASE) MCG/ACT IN AERS: 1.0000 | INHALATION_SPRAY | Freq: Four times a day (QID) | RESPIRATORY_TRACT | 0 refills | Status: DC | PRN

## 2017-01-05 NOTE — Discharge Instructions (Signed)
If symptoms become worse you will need to go to the ER  Use the inhaler as needed Follow up with your primary doctor We did not see any pneumonia on your x ray

## 2017-01-05 NOTE — ED Triage Notes (Signed)
PT reports URI symptoms for over 1 week. PT was seen last week and given a Z pack and tessalon. PT reports she feel worse.

## 2017-01-05 NOTE — ED Provider Notes (Signed)
[facility]    CSN: [id number] Arrival date & time: 01/05/17  1248     History   Chief Complaint Chief Complaint  Patient presents with  . URI    HPI [name] is a 72 y.o. female.   Pt states that she has been sick with cough, congestion, weakness for the past 2 weeks now. Was seen last week for the same thing and was given z pack and cough meds. No change. Denies productive cough, no fevers. Pt states that she wants a chest x ray and an inhaler that she has had in the past. Pt is a smoker.       Past Medical History:  Diagnosis Date  . Anemia    History  of Blood transfusion  . Atrial fibrillation with RVR ([place]) 11/2016  . CAD (coronary artery disease)    stent to RCA  . Cancer ([place])    clear cell cancer, kidney  . Chronic renal insufficiency    On hemodialysis  . Complication of anesthesia 12/2010   pt is very confused, with AMS with anesthesia  . CVA 07/27/2008   CVA affected cognition and memory per family, no focal deficits.    . CVA (cerebral infarction) 2003   no apparent residual  . Diastolic congestive heart failure ([place])   . Dyslipidemia   . Encephalopathy   . ESRD 07/27/2008   ESRD due to HTN and NSAID's, started hemodialysis in 2005 in [place], [place]. Went to [facility] from 2010 to 2012 and since 2012 has been getting dialysis at [facility] on [address] in [place] on a MWF schedule. First access with RUA AVG placed in [place]. Next and current access was LUA AVG placed by Dr. Lucky Cowboy in [place] in or around 2012. Has had 2 or 3 procedures on that graft since placed per family. She gets her access work done here in [place] now. She is allergic to heparin and does not get any heparin at dialysis; she had an allergic reaction apparently when in ICU in the past.      . GERD (gastroesophageal reflux disease)   . Hyperlipidemia   . Hypertension   . Hypothyroidism   . Positive PPD    completed rifampin  . Pulmonary hypertension  ([place])   . PVD (peripheral vascular disease) ([place])   . Seizures ([place])    last seizure 6 years ago  . Sleep apnea    Sleep Study 2008  . Traumatic seroma of left lower leg     Patient Active Problem List   Diagnosis Date Noted  . Atrial fibrillation ([place]) [I48.91] 12/08/2016  . Long term (current) use of anticoagulants [Z79.01] 12/08/2016  . NSTEMI (non-ST elevated myocardial infarction) ([place]) 11/21/2016  . Atrial fibrillation with RVR ([place]) 11/20/2016  . Precordial pain 10/26/2016  . Hematoma 09/12/2016  . Dyslipidemia 05/25/2016  . Coronary artery disease involving native coronary artery of native heart without angina pectoris 05/25/2016  . Atherosclerosis of native arteries of extremity with intermittent claudication ([place]) 12/16/2014  . Nausea vomiting and diarrhea 09/03/2014  . Hyperkalemia 09/03/2014  . Diarrhea 09/03/2014  . ESRD (end stage renal disease) ([place])   . Pulmonary hypertension ([place]) 09/23/2012  . Hyperkalemia, diminished renal excretion 06/27/2012  . Generalized convulsive epilepsy without mention of intractable epilepsy 09/14/2011  . Epilepsy ([place]) 05/04/2011  . Physical deconditioning 05/04/2011  . HTN (hypertension) 04/30/2011  . Depression 04/30/2011  . GERD (gastroesophageal reflux disease) 04/30/2011  . Seizure ([place]) 04/29/2011  .  Nonspecific abnormal electrocardiogram (ECG) (EKG) 02/23/2011  . Essential hypertension, malignant 05/11/2009  . Chronic diastolic heart failure ([place]) 05/11/2009  . TOBACCO USER 07/27/2008  . Coronary atherosclerosis 07/27/2008  . CVA 07/27/2008  . PVD 07/27/2008  . ESRD 07/27/2008  . OTHER AND UNSPECIFIED HYPERLIPIDEMIA 07/24/2008    Past Surgical History:  Procedure Laterality Date  . ABDOMINAL AORTOGRAM W/LOWER EXTREMITY Bilateral 06/15/2016   Procedure: Abdominal Aortogram w/Lower Extremity;  Surgeon: Conrad [place]on, MD;  Location: [facility];  Service: Cardiovascular;  Laterality: Bilateral;  . ABDOMINAL  HYSTERECTOMY    . APPENDECTOMY    . APPLICATION OF WOUND VAC Left 09/12/2016   Procedure: APPLICATION OF WOUND VAC;  Surgeon: Conrad Waleska, MD;  Location: [facility];  Service: Vascular;  Laterality: Left;  . ARTERY EXPLORATION Left 07/25/2016   Procedure: ARTERY EXPLORATION LEFT ABOVE KNEE POPLITEAL AND GROIN;  Surgeon: Conrad Stapleton, MD;  Location: [facility];  Service: Vascular;  Laterality: Left;  . CARDIAC CATHETERIZATION     last 2016  . CHOLECYSTECTOMY    . D&Cs    . HEMATOMA EVACUATION Left 09/12/2016   Procedure: EVACUATION HEMATOMA;  Surgeon: Conrad [place], MD;  Location: [facility];  Service: Vascular;  Laterality: Left;  . LEFT HEART CATH AND CORONARY ANGIOGRAPHY N/A 11/22/2016   Procedure: LEFT HEART CATH AND CORONARY ANGIOGRAPHY;  Surgeon: Belva Crome, MD;  Location: [facility];  Service: Cardiovascular;  Laterality: N/A;  . LEFT HEART CATHETERIZATION WITH CORONARY ANGIOGRAM N/A 02/22/2011   Procedure: LEFT HEART CATHETERIZATION WITH CORONARY ANGIOGRAM;  Surgeon: Wellington Hampshire, MD;  Location: [facility] CATH LAB;  Service: Cardiovascular;  Laterality: N/A;  . left nephrectomy    . NEPHRECTOMY Left    Malignant tumor  . PERIPHERAL VASCULAR CATHETERIZATION N/A 12/31/2014   Procedure: Abdominal Aortogram;  Surgeon: Conrad [place], MD;  Location: [facility];  Service: Cardiovascular;  Laterality: N/A;  . right ankle repair    . RIGHT HEART CATHETERIZATION N/A 01/29/2014   Procedure: RIGHT HEART CATH;  Surgeon: Larey Dresser, MD;  Location[facility] CATH [facility];  Service: Cardiovascular;  Laterality: N/A;  . TONSILLECTOMY    . ULTRASOUND GUIDANCE FOR VASCULAR ACCESS  11/22/2016   Procedure: Ultrasound Guidance For Vascular Access;  Surgeon: Belva Crome, MD;  Location: [facility];  Service: Cardiovascular;;  . VASCULAR SURGERY  11/2010   graft inserted to left arm    OB History    No data available       Home Medications    Prior to Admission medications   Medication Sig  Start Date End Date Taking? Authorizing Provider  acetaminophen (TYLENOL) 500 MG tablet Take 1,000 mg by mouth daily as needed for mild pain.     [provider]  albuterol (PROVENTIL HFA;VENTOLIN HFA) 108 (90 Base) MCG/ACT inhaler Inhale 1-2 puffs into the lungs every 6 (six) hours as needed for wheezing or shortness of breath. 01/05/17   Marney Setting, NP  carvedilol (COREG) 25 MG tablet Take 25 mg by mouth 2 (two) times daily with a meal.    [provider]  cilostazol (PLETAL) 100 MG tablet Take 1 tablet (100 mg total) by mouth 2 (two) times daily before a meal. 05/24/16   Conrad Jordan, MD  diltiazem (CARDIZEM CD) 120 MG 24 hr capsule Take 1 capsule (120 mg total) by mouth daily. 11/24/16   Doreatha Lew, MD  diphenhydrAMINE (BENADRYL) 50 MG capsule  Take 1 capsule (50 mg total) by mouth every Monday, Wednesday, and Friday. On dialysis days 09/15/16   Gabriel Earing, PA-C  ferric citrate (AURYXIA) 1 GM 210 MG(Fe) tablet Take 420 mg by mouth 3 (three) times daily with meals. And 1 tablet with snacks    [provider]  folic acid-vitamin b complex-vitamin c-selenium-zinc (DIALYVITE) 3 MG TABS tablet Take 1 tablet by mouth daily.    [provider]  hydrALAZINE (APRESOLINE) 25 MG tablet Take 25 mg 2 (two) times daily by mouth.     [provider]  isosorbide dinitrate (ISORDIL) 20 MG tablet Take 1 tablet (20 mg total) by mouth 3 (three) times daily. 10/26/16   Minus Breeding, MD  LETAIRIS 5 MG tablet TAKE 1 TABLET (5MG ) BY MOUTH DAILY. DO NOT HANDLE IF PREGNANT. DO NOTSPLIT, CRUSH, OR CHEW. AVOID INHALATION AND CONTACT WITH SKIN OR EYES. 09/18/16   Juanito Doom, MD  levETIRAcetam (KEPPRA) 500 MG tablet Take 500 mg by mouth daily at 8 pm.    [provider]  levothyroxine (SYNTHROID, LEVOTHROID) 150 MCG tablet Take 150 mcg by mouth daily before breakfast.    [provider]  nitroGLYCERIN (NITROSTAT) 0.4 MG SL tablet Place  1 tablet (0.4 mg total) under the tongue every 5 (five) minutes as needed for chest pain. 10/26/16   Minus Breeding, MD  omeprazole (PRILOSEC OTC) 20 MG tablet Take 20 mg by mouth daily.    [provider]  oxyCODONE (ROXICODONE) 5 MG immediate release tablet Take 1 tablet (5 mg total) by mouth every 6 (six) hours as needed for severe pain. 09/14/16   Rhyne, Hulen Shouts, PA-C  pravastatin (PRAVACHOL) 40 MG tablet Take 1 tablet (40 mg total) by mouth at bedtime. 05/23/16   Minus Breeding, MD  SENSIPAR 90 MG tablet Take 90 mg by mouth every Monday, Wednesday, and Friday with hemodialysis.  03/05/14   [provider]  traZODone (DESYREL) 50 MG tablet Take 50 mg by mouth at bedtime as needed for sleep.     [provider]  warfarin (COUMADIN) 5 MG tablet Take 1 tablets daily or as directed by coumadin clinic 12/19/16   Minus Breeding, MD    Family History Family History  Problem Relation Age of Onset  . Heart disease Father   . Hypertension Mother   . Dementia Mother   . Coronary artery disease Sister   . Heart attack Sister   . Hypertension Brother     Social History Social History   Tobacco Use  . Smoking status: Current Some Day Smoker    Packs/day: 0.00    Years: 53.00    Pack years: 0.00    Types: Cigarettes  . Smokeless tobacco: Never Used  Substance Use Topics  . Alcohol use: Yes    Alcohol/week: 0.0 oz    Comment: very rarely per pt  . Drug use: No    Comment: former marijuana use, several years     Allergies   Ace inhibitors; Heparin; Iohexol; Phenytoin; Wellbutrin [bupropion]; Meperidine hcl; Morphine; Penicillins; Valproic acid and related; Iodinated diagnostic agents; and Pentazocine lactate   Review of Systems Review of Systems  Constitutional: Negative.   HENT: Negative.   Eyes: Negative.   Respiratory: Positive for cough.   Cardiovascular: Negative.   Gastrointestinal: Negative.   Genitourinary: Negative.   Musculoskeletal:        Feels weak at times   Skin: Negative.   Neurological: Negative.  Physical Exam Triage Vital Signs ED Triage Vitals  Enc Vitals Group     BP 01/05/17 1403 (!) 175/74     Pulse Rate 01/05/17 1402 92     Resp 01/05/17 1402 16     Temp 01/05/17 1402 98.9 F (37.2 C)     Temp Source 01/05/17 1402 Oral     SpO2 01/05/17 1402 100 %     Weight 01/05/17 1401 170 lb (77.1 kg)     Height --      Head Circumference --      Peak Flow --      Pain Score 01/05/17 1403 4     Pain Loc --      Pain Edu? --      Excl. in [place]? --    No data found.  Updated Vital Signs BP (!) 175/74   Pulse 92   Temp 98.9 F (37.2 C) (Oral)   Resp 16   Wt 170 lb (77.1 kg)   SpO2 100%   BMI 30.60 kg/m   Visual Acuity  Physical Exam  Constitutional: She appears well-developed.  HENT:  Right Ear: External ear normal.  Left Ear: External ear normal.  Nose: Nose normal.  Mouth/Throat: Oropharynx is clear and moist.  Eyes: Pupils are equal, round, and reactive to light.  Neck: Normal range of motion.  Cardiovascular: Normal rate and regular rhythm.  Pulmonary/Chest: Effort normal and breath sounds normal.  Abdominal: Soft. Bowel sounds are normal.  Musculoskeletal: Normal range of motion.  Neurological: She is alert.  Skin: Skin is warm. Capillary refill takes less than 2 seconds.     UC Treatments / Results  Labs (all labs ordered are listed, but only abnormal results are displayed) Labs Reviewed - No data to display  EKG  EKG Interpretation None       Radiology Dg Chest 2 View  Result Date: 01/05/2017 CLINICAL DATA:  History of MI. EXAM: CHEST  2 VIEW COMPARISON:  11/20/2016.  10/06/2013. FINDINGS: Cardiomegaly with bilateral from interstitial prominence. Findings consistent mild congestive heart failure interstitial edema. Similar findings noted on prior exam. No prominent pleural effusion. No pneumothorax. IMPRESSION: Cardiomegaly with bilateral from interstitial prominence again  noted. Similar findings noted on prior exam. Findings again consistent with CHF. Electronically Signed   By: Marcello Moores  Register   On: 01/05/2017 14:55    Procedures Procedures (including critical care time)  Medications Ordered in UC Medications  methylPREDNISolone sodium succinate (SOLU-MEDROL) 125 mg/2 mL injection 125 mg (125 mg Intravenous Given 01/05/17 1428)     Initial Impression / Assessment and Plan / UC Course  I have reviewed the triage vital signs and the nursing notes.  Pertinent labs & imaging results that were available during my care of the patient were reviewed by me and considered in my medical decision making (see chart for details).    f symptoms become worse you will need to go to the ER  Use the inhaler as needed Follow up with your primary doctor We did not see any pneumonia on your x ray  Reviewed previous charts  Discussed care with MD also     Final Clinical Impressions(s) / UC Diagnoses   Final diagnoses:  Cough  COPD exacerbation [facility])    ED Discharge Orders        Ordered    albuterol (PROVENTIL HFA;VENTOLIN HFA) 108 (90 Base) MCG/ACT inhaler  Every 6 hours PRN     01/05/17 1431  Controlled Substance Prescriptions [place] Controlled Substance Registry consulted? Not Applicable   Marney Setting, NP 01/05/17 [phone]

## 2017-01-05 NOTE — ED Triage Notes (Signed)
PT dialyzed today and did a full treatment

## 2017-01-08 DIAGNOSIS — E119 Type 2 diabetes mellitus without complications: Secondary | ICD-10-CM | POA: Diagnosis not present

## 2017-01-08 DIAGNOSIS — D509 Iron deficiency anemia, unspecified: Secondary | ICD-10-CM | POA: Diagnosis not present

## 2017-01-08 DIAGNOSIS — N2581 Secondary hyperparathyroidism of renal origin: Secondary | ICD-10-CM | POA: Diagnosis not present

## 2017-01-08 DIAGNOSIS — N186 End stage renal disease: Secondary | ICD-10-CM | POA: Diagnosis not present

## 2017-01-08 DIAGNOSIS — D631 Anemia in chronic kidney disease: Secondary | ICD-10-CM | POA: Diagnosis not present

## 2017-01-10 DIAGNOSIS — D509 Iron deficiency anemia, unspecified: Secondary | ICD-10-CM | POA: Diagnosis not present

## 2017-01-10 DIAGNOSIS — I482 Chronic atrial fibrillation: Secondary | ICD-10-CM | POA: Diagnosis not present

## 2017-01-10 DIAGNOSIS — N186 End stage renal disease: Secondary | ICD-10-CM | POA: Diagnosis not present

## 2017-01-10 DIAGNOSIS — D631 Anemia in chronic kidney disease: Secondary | ICD-10-CM | POA: Diagnosis not present

## 2017-01-10 DIAGNOSIS — E119 Type 2 diabetes mellitus without complications: Secondary | ICD-10-CM | POA: Diagnosis not present

## 2017-01-10 DIAGNOSIS — N2581 Secondary hyperparathyroidism of renal origin: Secondary | ICD-10-CM | POA: Diagnosis not present

## 2017-01-10 LAB — PROTIME-INR: INR: 1.8 — AB (ref <=1.1)

## 2017-01-11 ENCOUNTER — Other Ambulatory Visit: Payer: Self-pay

## 2017-01-11 MED ORDER — WARFARIN SODIUM 5 MG PO TABS: ORAL_TABLET | ORAL | 0 refills | Status: DC

## 2017-01-12 ENCOUNTER — Ambulatory Visit (INDEPENDENT_AMBULATORY_CARE_PROVIDER_SITE_OTHER): Payer: Medicare Other | Admitting: Pharmacist

## 2017-01-12 ENCOUNTER — Other Ambulatory Visit: Payer: Self-pay

## 2017-01-12 DIAGNOSIS — N2581 Secondary hyperparathyroidism of renal origin: Secondary | ICD-10-CM | POA: Diagnosis not present

## 2017-01-12 DIAGNOSIS — D631 Anemia in chronic kidney disease: Secondary | ICD-10-CM | POA: Diagnosis not present

## 2017-01-12 DIAGNOSIS — I4891 Unspecified atrial fibrillation: Secondary | ICD-10-CM | POA: Diagnosis not present

## 2017-01-12 DIAGNOSIS — D509 Iron deficiency anemia, unspecified: Secondary | ICD-10-CM | POA: Diagnosis not present

## 2017-01-12 DIAGNOSIS — Z7901 Long term (current) use of anticoagulants: Secondary | ICD-10-CM | POA: Diagnosis not present

## 2017-01-12 DIAGNOSIS — N186 End stage renal disease: Secondary | ICD-10-CM | POA: Diagnosis not present

## 2017-01-12 DIAGNOSIS — E119 Type 2 diabetes mellitus without complications: Secondary | ICD-10-CM | POA: Diagnosis not present

## 2017-01-15 ENCOUNTER — Other Ambulatory Visit: Payer: Self-pay | Admitting: Pharmacist

## 2017-01-15 DIAGNOSIS — E119 Type 2 diabetes mellitus without complications: Secondary | ICD-10-CM | POA: Diagnosis not present

## 2017-01-15 DIAGNOSIS — N2581 Secondary hyperparathyroidism of renal origin: Secondary | ICD-10-CM | POA: Diagnosis not present

## 2017-01-15 DIAGNOSIS — D631 Anemia in chronic kidney disease: Secondary | ICD-10-CM | POA: Diagnosis not present

## 2017-01-15 DIAGNOSIS — N186 End stage renal disease: Secondary | ICD-10-CM | POA: Diagnosis not present

## 2017-01-15 DIAGNOSIS — D509 Iron deficiency anemia, unspecified: Secondary | ICD-10-CM | POA: Diagnosis not present

## 2017-01-15 MED ORDER — WARFARIN SODIUM 5 MG PO TABS: ORAL_TABLET | ORAL | 1 refills | Status: DC

## 2017-01-17 DIAGNOSIS — N2581 Secondary hyperparathyroidism of renal origin: Secondary | ICD-10-CM | POA: Diagnosis not present

## 2017-01-17 DIAGNOSIS — E119 Type 2 diabetes mellitus without complications: Secondary | ICD-10-CM | POA: Diagnosis not present

## 2017-01-17 DIAGNOSIS — D631 Anemia in chronic kidney disease: Secondary | ICD-10-CM | POA: Diagnosis not present

## 2017-01-17 DIAGNOSIS — N186 End stage renal disease: Secondary | ICD-10-CM | POA: Diagnosis not present

## 2017-01-17 DIAGNOSIS — D509 Iron deficiency anemia, unspecified: Secondary | ICD-10-CM | POA: Diagnosis not present

## 2017-01-17 DIAGNOSIS — I482 Chronic atrial fibrillation: Secondary | ICD-10-CM | POA: Diagnosis not present

## 2017-01-17 LAB — PROTIME-INR: INR: 1.6 — AB (ref 0.9–1.1)

## 2017-01-18 NOTE — Progress Notes (Deleted)
Established Intermittent Claudication   History of Present Illness   [name] is a 72 y.o. ([birth year]) female who presents with chief complaint: ***.  This patient has previously undergone attempt at a L fem-AK pop bypass which failed due to severe calcification of AK pop.  The patient's symptoms have *** progressed.  The patient's symptoms are: ***.  The patient's treatment regimen currently included: maximal medical management and ***walking plan.  The patient's PMH, PSH, and SH, and FamHx are unchanged from ***.  Current Outpatient Medications  Medication Sig Dispense Refill  . acetaminophen (TYLENOL) 500 MG tablet Take 1,000 mg by mouth daily as needed for mild pain.     [name] albuterol (PROVENTIL HFA;VENTOLIN HFA) 108 (90 Base) MCG/ACT inhaler Inhale 1-2 puffs into the lungs every 6 (six) hours as needed for wheezing or shortness of breath. 1 Inhaler 0  . carvedilol (COREG) 25 MG tablet Take 25 mg by mouth 2 (two) times daily with a meal.    . cilostazol (PLETAL) 100 MG tablet Take 1 tablet (100 mg total) by mouth 2 (two) times daily before a meal. 60 tablet 11  . diltiazem (CARDIZEM CD) 120 MG 24 hr capsule Take 1 capsule (120 mg total) by mouth daily. 30 capsule 0  . diphenhydrAMINE (BENADRYL) 50 MG capsule Take 1 capsule (50 mg total) by mouth every Monday, Wednesday, and Friday. On dialysis days    . ferric citrate (AURYXIA) 1 GM 210 MG(Fe) tablet Take 420 mg by mouth 3 (three) times daily with meals. And 1 tablet with snacks    . folic acid-vitamin b complex-vitamin c-selenium-zinc (DIALYVITE) 3 MG TABS tablet Take 1 tablet by mouth daily.    . hydrALAZINE (APRESOLINE) 25 MG tablet Take 25 mg 2 (two) times daily by mouth.     . isosorbide dinitrate (ISORDIL) 20 MG tablet Take 1 tablet (20 mg total) by mouth 3 (three) times daily. 270 tablet 3  . LETAIRIS 5 MG tablet TAKE 1 TABLET (5MG ) BY MOUTH DAILY. DO NOT HANDLE IF PREGNANT. DO NOTSPLIT, CRUSH, OR CHEW. AVOID INHALATION  AND CONTACT WITH SKIN OR EYES. 30 tablet 11  . levETIRAcetam (KEPPRA) 500 MG tablet Take 500 mg by mouth daily at 8 pm.    . levothyroxine (SYNTHROID, LEVOTHROID) 150 MCG tablet Take 150 mcg by mouth daily before breakfast.    . nitroGLYCERIN (NITROSTAT) 0.4 MG SL tablet Place 1 tablet (0.4 mg total) under the tongue every 5 (five) minutes as needed for chest pain. 25 tablet 1  . omeprazole (PRILOSEC OTC) 20 MG tablet Take 20 mg by mouth daily.    [name] oxyCODONE (ROXICODONE) 5 MG immediate release tablet Take 1 tablet (5 mg total) by mouth every 6 (six) hours as needed for severe pain. 30 tablet 0  . pravastatin (PRAVACHOL) 40 MG tablet Take 1 tablet (40 mg total) by mouth at bedtime. 90 tablet 3  . SENSIPAR 90 MG tablet Take 90 mg by mouth every Monday, Wednesday, and Friday with hemodialysis.     [name] traZODone (DESYREL) 50 MG tablet Take 50 mg by mouth at bedtime as needed for sleep.     [name] warfarin (COUMADIN) 5 MG tablet Take 1 to 1 and 1/2 tablets daily or as directed by coumadin clinic 45 tablet 1   No current facility-administered medications for this visit.     On ROS today: ***, ***.   Physical Examination  ***There were no vitals filed for this visit. ***There is no height  or weight on file to calculate BMI.  General {LOC:19197::"Somulent","Alert"}, {Orientation:19197::"Confused","O x 3"}, {Weight:19197::"Obese","Cachectic","WD"}, {General state of health:19197::"Ill appearing","Elderly","NAD"}  Pulmonary {Chest wall:19197::"Asx chest movement","Sym exp"}, {Air movt:19197::"Decreased *** air movt","good B air movt"}, {BS:19197::"rales on ***","rhonchi on ***","wheezing on ***","CTA B"}  Cardiac {Rhythm:19197::"Irregularly, irregular rate and rhythm","RRR, Nl S1, S2"}, {Murmur:19197::"Murmur present: ***","no Murmurs"}, {Rubs:19197::"Rub present: ***","No rubs"}, {Gallop:19197::"Gallop present: ***","No S3,S4"}  Vascular Vessel Right Left  Radial {Palpable:19197::"Not palpable","Faintly  palpable","Palpable"} {Palpable:19197::"Not palpable","Faintly palpable","Palpable"}  Brachial {Palpable:19197::"Not palpable","Faintly palpable","Palpable"} {Palpable:19197::"Not palpable","Faintly palpable","Palpable"}  Carotid Palpable, {Bruit:19197::"Bruit present","No Bruit"} Palpable, {Bruit:19197::"Bruit present","No Bruit"}  Aorta Not palpable N/A  Femoral {Palpable:19197::"Not palpable","Faintly palpable","Palpable"} {Palpable:19197::"Not palpable","Faintly palpable","Palpable"}  Popliteal {Palpable:19197::"Prominently palpable","Not palpable"} {Palpable:19197::"Prominently palpable","Not palpable"}  PT {Palpable:19197::"Not palpable","Faintly palpable","Palpable"} {Palpable:19197::"Not palpable","Faintly palpable","Palpable"}  DP {Palpable:19197::"Not palpable","Faintly palpable","Palpable"} {Palpable:19197::"Not palpable","Faintly palpable","Palpable"}    Gastro- intestinal soft, {Distension:19197::"distended","non-distended"}, {TTP:19197::"TTP in *** quadrant","appropriate tenderness to palpation","non-tender to palpation"}, {Guarding:19197::"Guarding and rebound in *** quadrant","No guarding or rebound"}, {HSM:19197::"HSM present","no HSM"}, {Masses:19197::"Mass present: ***","no masses"}, {Flank:19197::"CVAT on ***","Flank bruit present on ***","no CVAT B"}, {AAA:19197::"Palpable prominent aortic pulse","No palpable prominent aortic pulse"}, {Surgical incision:19197::"Surg incisions: ***","Surgical incisions well healed"," "}  Musculo- skeletal M/S 5/5 throughout {MS:19197::"except ***"," "}, Extremities without ischemic changes {MS:19197::"except ***"," "}, {Edema:19197::"Pitting edema present: ***","Non-pitting edema present: ***","No edema present"}, {Varicosities:19197::"Varicosities present: ***","No visible varicosities "}, {LDS:19197::"Lipodermatosclerosis present: ***","No Lipodermatosclerosis present"}  Neurologic Pain and light touch intact in extremities{CN:19197::" except for  decreased sensation in ***"," "}, Motor exam as listed above    Non-Invasive Vascular imaging   ABI (***)  R:   ABI: *** (***),   PT: {Signals:19197::"none","mono","bi","tri"}  DP: {Signals:19197::"none","mono","bi","tri"}  TBI:  ***  L:   ABI: *** (***),   PT: {Signals:19197::"none","mono","bi","tri"}  DP: {Signals:19197::"none","mono","bi","tri"}  TBI: ***  Aortoiliac Duplex (***)  Ao: *** c/s  R iliac: *** c/s  L iliac: *** c/s   Medical Decision Making   KALEESI GUYTON is a 72 y.o. female who presents with:  {Side:17767} leg intermittent claudication without evidence of critical limb ischemia.   Based on the patient's vascular studies and examination, I have offered the patient: ***.  I discussed in depth with the patient the nature of atherosclerosis, and emphasized the importance of maximal medical management including strict control of blood pressure, blood glucose, and lipid levels, antiplatelet agents, obtaining regular exercise, and cessation of smoking.    The patient is aware that without maximal medical management the underlying atherosclerotic disease process will progress, limiting the benefit of any interventions. The patient is currently on a statin: Pravachol.  The patient is currently not on an anti-platelet due to bleeding risks related to use of an anticoagulant.  Thank you for allowing [place] to participate in this patient's care.   Adele Barthel, MD, FACS [facility] Office: [phone] Pager: [phone]

## 2017-01-19 ENCOUNTER — Ambulatory Visit (INDEPENDENT_AMBULATORY_CARE_PROVIDER_SITE_OTHER): Payer: Medicare Other | Admitting: Pharmacist Clinician (PhC)/ Clinical Pharmacy Specialist

## 2017-01-19 ENCOUNTER — Encounter (HOSPITAL_COMMUNITY): Payer: Medicare Other

## 2017-01-19 ENCOUNTER — Ambulatory Visit: Payer: Medicare Other | Admitting: Vascular Surgery

## 2017-01-19 DIAGNOSIS — N186 End stage renal disease: Secondary | ICD-10-CM | POA: Diagnosis not present

## 2017-01-19 DIAGNOSIS — I4891 Unspecified atrial fibrillation: Secondary | ICD-10-CM | POA: Diagnosis not present

## 2017-01-19 DIAGNOSIS — Z7901 Long term (current) use of anticoagulants: Secondary | ICD-10-CM | POA: Diagnosis not present

## 2017-01-19 DIAGNOSIS — D509 Iron deficiency anemia, unspecified: Secondary | ICD-10-CM | POA: Diagnosis not present

## 2017-01-19 DIAGNOSIS — N2581 Secondary hyperparathyroidism of renal origin: Secondary | ICD-10-CM | POA: Diagnosis not present

## 2017-01-19 DIAGNOSIS — D631 Anemia in chronic kidney disease: Secondary | ICD-10-CM | POA: Diagnosis not present

## 2017-01-19 DIAGNOSIS — E119 Type 2 diabetes mellitus without complications: Secondary | ICD-10-CM | POA: Diagnosis not present

## 2017-01-22 DIAGNOSIS — N186 End stage renal disease: Secondary | ICD-10-CM | POA: Diagnosis not present

## 2017-01-22 DIAGNOSIS — N2581 Secondary hyperparathyroidism of renal origin: Secondary | ICD-10-CM | POA: Diagnosis not present

## 2017-01-22 DIAGNOSIS — D631 Anemia in chronic kidney disease: Secondary | ICD-10-CM | POA: Diagnosis not present

## 2017-01-22 DIAGNOSIS — D509 Iron deficiency anemia, unspecified: Secondary | ICD-10-CM | POA: Diagnosis not present

## 2017-01-22 DIAGNOSIS — E119 Type 2 diabetes mellitus without complications: Secondary | ICD-10-CM | POA: Diagnosis not present

## 2017-01-24 DIAGNOSIS — D509 Iron deficiency anemia, unspecified: Secondary | ICD-10-CM | POA: Diagnosis not present

## 2017-01-24 DIAGNOSIS — E119 Type 2 diabetes mellitus without complications: Secondary | ICD-10-CM | POA: Diagnosis not present

## 2017-01-24 DIAGNOSIS — N2581 Secondary hyperparathyroidism of renal origin: Secondary | ICD-10-CM | POA: Diagnosis not present

## 2017-01-24 DIAGNOSIS — D631 Anemia in chronic kidney disease: Secondary | ICD-10-CM | POA: Diagnosis not present

## 2017-01-24 DIAGNOSIS — N186 End stage renal disease: Secondary | ICD-10-CM | POA: Diagnosis not present

## 2017-01-24 DIAGNOSIS — I482 Chronic atrial fibrillation: Secondary | ICD-10-CM | POA: Diagnosis not present

## 2017-01-25 DIAGNOSIS — E039 Hypothyroidism, unspecified: Secondary | ICD-10-CM | POA: Diagnosis not present

## 2017-01-25 DIAGNOSIS — I7090 Unspecified atherosclerosis: Secondary | ICD-10-CM | POA: Diagnosis not present

## 2017-01-25 DIAGNOSIS — Z1231 Encounter for screening mammogram for malignant neoplasm of breast: Secondary | ICD-10-CM | POA: Diagnosis not present

## 2017-01-25 DIAGNOSIS — E782 Mixed hyperlipidemia: Secondary | ICD-10-CM | POA: Diagnosis not present

## 2017-01-25 DIAGNOSIS — I132 Hypertensive heart and chronic kidney disease with heart failure and with stage 5 chronic kidney disease, or end stage renal disease: Secondary | ICD-10-CM | POA: Diagnosis not present

## 2017-01-25 DIAGNOSIS — I272 Pulmonary hypertension, unspecified: Secondary | ICD-10-CM | POA: Diagnosis not present

## 2017-01-25 DIAGNOSIS — N186 End stage renal disease: Secondary | ICD-10-CM | POA: Diagnosis not present

## 2017-01-25 DIAGNOSIS — Z1211 Encounter for screening for malignant neoplasm of colon: Secondary | ICD-10-CM | POA: Diagnosis not present

## 2017-01-25 DIAGNOSIS — Z992 Dependence on renal dialysis: Secondary | ICD-10-CM | POA: Diagnosis not present

## 2017-01-25 DIAGNOSIS — I5032 Chronic diastolic (congestive) heart failure: Secondary | ICD-10-CM | POA: Diagnosis not present

## 2017-01-26 DIAGNOSIS — D509 Iron deficiency anemia, unspecified: Secondary | ICD-10-CM | POA: Diagnosis not present

## 2017-01-26 DIAGNOSIS — N186 End stage renal disease: Secondary | ICD-10-CM | POA: Diagnosis not present

## 2017-01-26 DIAGNOSIS — D631 Anemia in chronic kidney disease: Secondary | ICD-10-CM | POA: Diagnosis not present

## 2017-01-26 DIAGNOSIS — E119 Type 2 diabetes mellitus without complications: Secondary | ICD-10-CM | POA: Diagnosis not present

## 2017-01-26 DIAGNOSIS — N2581 Secondary hyperparathyroidism of renal origin: Secondary | ICD-10-CM | POA: Diagnosis not present

## 2017-01-29 DIAGNOSIS — E119 Type 2 diabetes mellitus without complications: Secondary | ICD-10-CM | POA: Diagnosis not present

## 2017-01-29 DIAGNOSIS — D631 Anemia in chronic kidney disease: Secondary | ICD-10-CM | POA: Diagnosis not present

## 2017-01-29 DIAGNOSIS — N2581 Secondary hyperparathyroidism of renal origin: Secondary | ICD-10-CM | POA: Diagnosis not present

## 2017-01-29 DIAGNOSIS — N186 End stage renal disease: Secondary | ICD-10-CM | POA: Diagnosis not present

## 2017-01-29 DIAGNOSIS — D509 Iron deficiency anemia, unspecified: Secondary | ICD-10-CM | POA: Diagnosis not present

## 2017-02-01 DIAGNOSIS — N186 End stage renal disease: Secondary | ICD-10-CM | POA: Diagnosis not present

## 2017-02-01 DIAGNOSIS — E1129 Type 2 diabetes mellitus with other diabetic kidney complication: Secondary | ICD-10-CM | POA: Diagnosis not present

## 2017-02-01 DIAGNOSIS — Z992 Dependence on renal dialysis: Secondary | ICD-10-CM | POA: Diagnosis not present

## 2017-02-02 DIAGNOSIS — N2581 Secondary hyperparathyroidism of renal origin: Secondary | ICD-10-CM | POA: Diagnosis not present

## 2017-02-02 DIAGNOSIS — E119 Type 2 diabetes mellitus without complications: Secondary | ICD-10-CM | POA: Diagnosis not present

## 2017-02-02 DIAGNOSIS — E1129 Type 2 diabetes mellitus with other diabetic kidney complication: Secondary | ICD-10-CM | POA: Diagnosis not present

## 2017-02-02 DIAGNOSIS — N186 End stage renal disease: Secondary | ICD-10-CM | POA: Diagnosis not present

## 2017-02-02 DIAGNOSIS — D509 Iron deficiency anemia, unspecified: Secondary | ICD-10-CM | POA: Diagnosis not present

## 2017-02-02 DIAGNOSIS — I482 Chronic atrial fibrillation: Secondary | ICD-10-CM | POA: Diagnosis not present

## 2017-02-02 DIAGNOSIS — Z992 Dependence on renal dialysis: Secondary | ICD-10-CM | POA: Diagnosis not present

## 2017-02-02 DIAGNOSIS — D631 Anemia in chronic kidney disease: Secondary | ICD-10-CM | POA: Diagnosis not present

## 2017-02-05 DIAGNOSIS — D509 Iron deficiency anemia, unspecified: Secondary | ICD-10-CM | POA: Diagnosis not present

## 2017-02-05 DIAGNOSIS — N186 End stage renal disease: Secondary | ICD-10-CM | POA: Diagnosis not present

## 2017-02-05 DIAGNOSIS — N2581 Secondary hyperparathyroidism of renal origin: Secondary | ICD-10-CM | POA: Diagnosis not present

## 2017-02-05 DIAGNOSIS — D631 Anemia in chronic kidney disease: Secondary | ICD-10-CM | POA: Diagnosis not present

## 2017-02-05 DIAGNOSIS — E119 Type 2 diabetes mellitus without complications: Secondary | ICD-10-CM | POA: Diagnosis not present

## 2017-02-07 ENCOUNTER — Other Ambulatory Visit (HOSPITAL_COMMUNITY): Payer: Self-pay | Admitting: Interventional Radiology

## 2017-02-07 ENCOUNTER — Other Ambulatory Visit: Payer: Self-pay | Admitting: Radiology

## 2017-02-07 DIAGNOSIS — D509 Iron deficiency anemia, unspecified: Secondary | ICD-10-CM | POA: Diagnosis not present

## 2017-02-07 DIAGNOSIS — N2581 Secondary hyperparathyroidism of renal origin: Secondary | ICD-10-CM | POA: Diagnosis not present

## 2017-02-07 DIAGNOSIS — E119 Type 2 diabetes mellitus without complications: Secondary | ICD-10-CM | POA: Diagnosis not present

## 2017-02-07 DIAGNOSIS — N186 End stage renal disease: Secondary | ICD-10-CM | POA: Diagnosis not present

## 2017-02-07 DIAGNOSIS — D179 Benign lipomatous neoplasm, unspecified: Secondary | ICD-10-CM

## 2017-02-07 DIAGNOSIS — D631 Anemia in chronic kidney disease: Secondary | ICD-10-CM | POA: Diagnosis not present

## 2017-02-07 MED ORDER — DIPHENHYDRAMINE HCL 50 MG PO CAPS: 50.0000 mg | ORAL_CAPSULE | Freq: Once | ORAL | Status: AC

## 2017-02-07 MED ORDER — PREDNISONE 50 MG PO TABS: ORAL_TABLET | ORAL | 0 refills | Status: DC

## 2017-02-09 DIAGNOSIS — D631 Anemia in chronic kidney disease: Secondary | ICD-10-CM | POA: Diagnosis not present

## 2017-02-09 DIAGNOSIS — N2581 Secondary hyperparathyroidism of renal origin: Secondary | ICD-10-CM | POA: Diagnosis not present

## 2017-02-09 DIAGNOSIS — N186 End stage renal disease: Secondary | ICD-10-CM | POA: Diagnosis not present

## 2017-02-09 DIAGNOSIS — D509 Iron deficiency anemia, unspecified: Secondary | ICD-10-CM | POA: Diagnosis not present

## 2017-02-09 DIAGNOSIS — E119 Type 2 diabetes mellitus without complications: Secondary | ICD-10-CM | POA: Diagnosis not present

## 2017-02-12 DIAGNOSIS — N186 End stage renal disease: Secondary | ICD-10-CM | POA: Diagnosis not present

## 2017-02-12 DIAGNOSIS — E119 Type 2 diabetes mellitus without complications: Secondary | ICD-10-CM | POA: Diagnosis not present

## 2017-02-12 DIAGNOSIS — D631 Anemia in chronic kidney disease: Secondary | ICD-10-CM | POA: Diagnosis not present

## 2017-02-12 DIAGNOSIS — N2581 Secondary hyperparathyroidism of renal origin: Secondary | ICD-10-CM | POA: Diagnosis not present

## 2017-02-12 DIAGNOSIS — D509 Iron deficiency anemia, unspecified: Secondary | ICD-10-CM | POA: Diagnosis not present

## 2017-02-14 DIAGNOSIS — N186 End stage renal disease: Secondary | ICD-10-CM | POA: Diagnosis not present

## 2017-02-14 DIAGNOSIS — I482 Chronic atrial fibrillation: Secondary | ICD-10-CM | POA: Diagnosis not present

## 2017-02-14 DIAGNOSIS — E119 Type 2 diabetes mellitus without complications: Secondary | ICD-10-CM | POA: Diagnosis not present

## 2017-02-14 DIAGNOSIS — N2581 Secondary hyperparathyroidism of renal origin: Secondary | ICD-10-CM | POA: Diagnosis not present

## 2017-02-14 DIAGNOSIS — D631 Anemia in chronic kidney disease: Secondary | ICD-10-CM | POA: Diagnosis not present

## 2017-02-14 DIAGNOSIS — D509 Iron deficiency anemia, unspecified: Secondary | ICD-10-CM | POA: Diagnosis not present

## 2017-02-16 DIAGNOSIS — N2581 Secondary hyperparathyroidism of renal origin: Secondary | ICD-10-CM | POA: Diagnosis not present

## 2017-02-16 DIAGNOSIS — N186 End stage renal disease: Secondary | ICD-10-CM | POA: Diagnosis not present

## 2017-02-16 DIAGNOSIS — D631 Anemia in chronic kidney disease: Secondary | ICD-10-CM | POA: Diagnosis not present

## 2017-02-16 DIAGNOSIS — E119 Type 2 diabetes mellitus without complications: Secondary | ICD-10-CM | POA: Diagnosis not present

## 2017-02-16 DIAGNOSIS — D509 Iron deficiency anemia, unspecified: Secondary | ICD-10-CM | POA: Diagnosis not present

## 2017-02-19 DIAGNOSIS — D509 Iron deficiency anemia, unspecified: Secondary | ICD-10-CM | POA: Diagnosis not present

## 2017-02-19 DIAGNOSIS — N2581 Secondary hyperparathyroidism of renal origin: Secondary | ICD-10-CM | POA: Diagnosis not present

## 2017-02-19 DIAGNOSIS — E119 Type 2 diabetes mellitus without complications: Secondary | ICD-10-CM | POA: Diagnosis not present

## 2017-02-19 DIAGNOSIS — D631 Anemia in chronic kidney disease: Secondary | ICD-10-CM | POA: Diagnosis not present

## 2017-02-19 DIAGNOSIS — N186 End stage renal disease: Secondary | ICD-10-CM | POA: Diagnosis not present

## 2017-02-21 DIAGNOSIS — N186 End stage renal disease: Secondary | ICD-10-CM | POA: Diagnosis not present

## 2017-02-21 DIAGNOSIS — I482 Chronic atrial fibrillation: Secondary | ICD-10-CM | POA: Diagnosis not present

## 2017-02-21 DIAGNOSIS — D509 Iron deficiency anemia, unspecified: Secondary | ICD-10-CM | POA: Diagnosis not present

## 2017-02-21 DIAGNOSIS — E119 Type 2 diabetes mellitus without complications: Secondary | ICD-10-CM | POA: Diagnosis not present

## 2017-02-21 DIAGNOSIS — N2581 Secondary hyperparathyroidism of renal origin: Secondary | ICD-10-CM | POA: Diagnosis not present

## 2017-02-21 DIAGNOSIS — D631 Anemia in chronic kidney disease: Secondary | ICD-10-CM | POA: Diagnosis not present

## 2017-02-22 ENCOUNTER — Encounter: Payer: Self-pay | Admitting: Cardiology

## 2017-02-23 DIAGNOSIS — D631 Anemia in chronic kidney disease: Secondary | ICD-10-CM | POA: Diagnosis not present

## 2017-02-23 DIAGNOSIS — N186 End stage renal disease: Secondary | ICD-10-CM | POA: Diagnosis not present

## 2017-02-23 DIAGNOSIS — D509 Iron deficiency anemia, unspecified: Secondary | ICD-10-CM | POA: Diagnosis not present

## 2017-02-23 DIAGNOSIS — E119 Type 2 diabetes mellitus without complications: Secondary | ICD-10-CM | POA: Diagnosis not present

## 2017-02-23 DIAGNOSIS — N2581 Secondary hyperparathyroidism of renal origin: Secondary | ICD-10-CM | POA: Diagnosis not present

## 2017-02-26 DIAGNOSIS — N186 End stage renal disease: Secondary | ICD-10-CM | POA: Diagnosis not present

## 2017-02-26 DIAGNOSIS — N2581 Secondary hyperparathyroidism of renal origin: Secondary | ICD-10-CM | POA: Diagnosis not present

## 2017-02-26 DIAGNOSIS — D509 Iron deficiency anemia, unspecified: Secondary | ICD-10-CM | POA: Diagnosis not present

## 2017-02-26 DIAGNOSIS — E119 Type 2 diabetes mellitus without complications: Secondary | ICD-10-CM | POA: Diagnosis not present

## 2017-02-26 DIAGNOSIS — D631 Anemia in chronic kidney disease: Secondary | ICD-10-CM | POA: Diagnosis not present

## 2017-02-28 DIAGNOSIS — I482 Chronic atrial fibrillation: Secondary | ICD-10-CM | POA: Diagnosis not present

## 2017-02-28 DIAGNOSIS — D509 Iron deficiency anemia, unspecified: Secondary | ICD-10-CM | POA: Diagnosis not present

## 2017-02-28 DIAGNOSIS — D631 Anemia in chronic kidney disease: Secondary | ICD-10-CM | POA: Diagnosis not present

## 2017-02-28 DIAGNOSIS — N2581 Secondary hyperparathyroidism of renal origin: Secondary | ICD-10-CM | POA: Diagnosis not present

## 2017-02-28 DIAGNOSIS — N186 End stage renal disease: Secondary | ICD-10-CM | POA: Diagnosis not present

## 2017-02-28 DIAGNOSIS — E119 Type 2 diabetes mellitus without complications: Secondary | ICD-10-CM | POA: Diagnosis not present

## 2017-02-28 NOTE — Progress Notes (Signed)
Cardiology Office Note   Date:  03/01/2017   ID:  [name], [name] 01/11/1945, MRN [id number]  PCP:  Glendale Chard, MD  Cardiologist:   Minus Breeding, MD    Chief Complaint  Patient presents with  . Coronary Artery Disease      History of Present Illness: [name] is a 72 y.o. female who presents for follow up of nonobsructive coronary disease. She has end-stage renal disease and is on dialysis. She has had an extensive workup for dyspnea and this included an echocardiogram. This demonstrated pulmonary pressure to be 107 with an EF of 35-40%. She did have an evaluation in our office for consideration of an MRI because of a possible speckling on echo.  However, she could not have this done as she has some problems with claustrophobia. She had SPEP sent off. There was no protein spike on the SPEP.  She had a right heart cath with a normal wedge.  She was started on Letairis.  I saw her prior to popliteal bypass.  She had a perfusion study.  The study does demonstrate an inferior scar but was a low risk study with no other areas of ischemia.  She was approved for surgery.  However, she had a very complicated course with hematoma requiring surgical exploration and a wound vac.  Since I last saw her she was in the hospital with atrial fib.  I reviewed these records for this visit.  She was treated with Xarelto and Cardizem. She had a NSTEMI.  Cath demonstrated severe mid RCA stenosis as described below.  This was diffuse and managed medically.      She has been out of her Cardizem since she left the hospital.  Her blood pressures have been running a little high.  However, she has not had any of the symptoms she had when she was in atrial fibrillation.  She does not actually recall the symptoms very much but she knew something was going on.  She has not had any new chest pressure, neck or arm discomfort.  She has not had any new applications, presyncope or syncope.  She had no weight  gain or edema.  She is tolerated anticoagulation.  She is tolerated dialysis.   Past Medical History:  Diagnosis Date  . Anemia    History  of Blood transfusion  . Atrial fibrillation with RVR ([place]) 11/2016  . CAD (coronary artery disease)    stent to RCA  . Cancer ([place])    clear cell cancer, kidney  . Chronic renal insufficiency    On hemodialysis  . Complication of anesthesia 12/2010   pt is very confused, with AMS with anesthesia  . CVA 07/27/2008   CVA affected cognition and memory per family, no focal deficits.    . CVA (cerebral infarction) 2003   no apparent residual  . Diastolic congestive heart failure ([place])   . Dyslipidemia   . Encephalopathy   . ESRD 07/27/2008   ESRD due to HTN and NSAID's, started hemodialysis in 2005 in [place], [place]. Went to [facility] from 2010 to 2012 and since 2012 has been getting dialysis at [facility] on [address] in [place] on a MWF schedule. First access with RUA AVG placed in [place]. Next and current access was LUA AVG placed by Dr. Lucky Cowboy in [place] in or around 2012. Has had 2 or 3 procedures on that graft since placed per family. She gets her access work done here in [place]  now. She is allergic to heparin and does not get any heparin at dialysis; she had an allergic reaction apparently when in ICU in the past.      . GERD (gastroesophageal reflux disease)   . Hyperlipidemia   . Hypertension   . Hypothyroidism   . Positive PPD    completed rifampin  . Pulmonary hypertension ([place])   . PVD (peripheral vascular disease) ([place])   . Seizures ([place])    last seizure 6 years ago  . Sleep apnea    Sleep Study 2008  . Traumatic seroma of left lower leg     Past Surgical History:  Procedure Laterality Date  . ABDOMINAL AORTOGRAM W/LOWER EXTREMITY Bilateral 06/15/2016   Procedure: Abdominal Aortogram w/Lower Extremity;  Surgeon: Conrad Hockessin, MD;  Location: [facility];  Service: Cardiovascular;  Laterality: Bilateral;    . ABDOMINAL HYSTERECTOMY    . APPENDECTOMY    . APPLICATION OF WOUND VAC Left 09/12/2016   Procedure: APPLICATION OF WOUND VAC;  Surgeon: Conrad Cawood, MD;  Location: [facility];  Service: Vascular;  Laterality: Left;  . ARTERY EXPLORATION Left 07/25/2016   Procedure: ARTERY EXPLORATION LEFT ABOVE KNEE POPLITEAL AND GROIN;  Surgeon: Conrad [place], MD;  Location: [facility];  Service: Vascular;  Laterality: Left;  . CARDIAC CATHETERIZATION     last 2016  . CHOLECYSTECTOMY    . D&Cs    . HEMATOMA EVACUATION Left 09/12/2016   Procedure: EVACUATION HEMATOMA;  Surgeon: Conrad Mineral, MD;  Location: [facility];  Service: Vascular;  Laterality: Left;  . LEFT HEART CATH AND CORONARY ANGIOGRAPHY N/A 11/22/2016   Procedure: LEFT HEART CATH AND CORONARY ANGIOGRAPHY;  Surgeon: Belva Crome, MD;  Location: [facility];  Service: Cardiovascular;  Laterality: N/A;  . LEFT HEART CATHETERIZATION WITH CORONARY ANGIOGRAM N/A 02/22/2011   Procedure: LEFT HEART CATHETERIZATION WITH CORONARY ANGIOGRAM;  Surgeon: Wellington Hampshire, MD;  Location: [facility];  Service: Cardiovascular;  Laterality: N/A;  . left nephrectomy    . NEPHRECTOMY Left    Malignant tumor  . PERIPHERAL VASCULAR CATHETERIZATION N/A 12/31/2014   Procedure: Abdominal Aortogram;  Surgeon: Conrad Wallula, MD;  Location: [facility];  Service: Cardiovascular;  Laterality: N/A;  . right ankle repair    . RIGHT HEART CATHETERIZATION N/A 01/29/2014   Procedure: RIGHT HEART CATH;  Surgeon: Larey Dresser, MD;  Location: [facility];  Service: Cardiovascular;  Laterality: N/A;  . TONSILLECTOMY    . ULTRASOUND GUIDANCE FOR VASCULAR ACCESS  11/22/2016   Procedure: Ultrasound Guidance For Vascular Access;  Surgeon: Belva Crome, MD;  Location: [facility];  Service: Cardiovascular;;  . VASCULAR SURGERY  11/2010   graft inserted to left arm     Current Outpatient Medications  Medication Sig Dispense Refill  . acetaminophen (TYLENOL) 500 MG  tablet Take 1,000 mg by mouth daily as needed for mild pain.     [name] albuterol (PROVENTIL HFA;VENTOLIN HFA) 108 (90 Base) MCG/ACT inhaler Inhale 1-2 puffs into the lungs every 6 (six) hours as needed for wheezing or shortness of breath. 1 Inhaler 0  . carvedilol (COREG) 25 MG tablet Take 25 mg by mouth 2 (two) times daily with a meal.    . cilostazol (PLETAL) 100 MG tablet Take 1 tablet (100 mg total) by mouth 2 (two) times daily before a meal. 60 tablet 11  . diltiazem (CARDIZEM CD) 120 MG 24 hr capsule Take 1 capsule (  120 mg total) by mouth daily. 30 capsule 11  . diphenhydrAMINE (BENADRYL) 50 MG capsule Take 1 capsule (50 mg total) by mouth every Monday, Wednesday, and Friday. On dialysis days    . folic acid-vitamin b complex-vitamin c-selenium-zinc (DIALYVITE) 3 MG TABS tablet Take 1 tablet by mouth daily.    . hydrALAZINE (APRESOLINE) 25 MG tablet Take 25 mg 2 (two) times daily by mouth.     . isosorbide dinitrate (ISORDIL) 20 MG tablet Take 1 tablet (20 mg total) by mouth 3 (three) times daily. 270 tablet 3  . LETAIRIS 5 MG tablet TAKE 1 TABLET (5MG ) BY MOUTH DAILY. DO NOT HANDLE IF PREGNANT. DO NOTSPLIT, CRUSH, OR CHEW. AVOID INHALATION AND CONTACT WITH SKIN OR EYES. 30 tablet 11  . levETIRAcetam (KEPPRA) 500 MG tablet Take 500 mg by mouth daily at 8 pm.    . levothyroxine (SYNTHROID, LEVOTHROID) 150 MCG tablet Take 150 mcg by mouth daily before breakfast.    . nitroGLYCERIN (NITROSTAT) 0.4 MG SL tablet Place 1 tablet (0.4 mg total) under the tongue every 5 (five) minutes as needed for chest pain. 25 tablet 1  . omeprazole (PRILOSEC OTC) 20 MG tablet Take 20 mg by mouth daily.    [name] oxyCODONE (ROXICODONE) 5 MG immediate release tablet Take 1 tablet (5 mg total) by mouth every 6 (six) hours as needed for severe pain. 30 tablet 0  . pravastatin (PRAVACHOL) 40 MG tablet Take 1 tablet (40 mg total) by mouth at bedtime. 90 tablet 3  . [START ON 03/12/2017] predniSONE (DELTASONE) 50 MG tablet  Prednisone 50 mg po 13 hrs, 7 hrs and 1 hr prior to CT (scheduled 03/13/2017 at 8:30 am) Benadryl 50 mg po 1 hr prior to CT. 3 tablet 0  . SENSIPAR 90 MG tablet Take 90 mg by mouth every Monday, Wednesday, and Friday with hemodialysis.     [name] traZODone (DESYREL) 50 MG tablet Take 50 mg by mouth at bedtime as needed for sleep.     [name] warfarin (COUMADIN) 5 MG tablet Take 1 to 1 and 1/2 tablets daily or as directed by coumadin clinic 45 tablet 1   No current facility-administered medications for this visit.    Facility-Administered Medications Ordered in Other Visits  Medication Dose Route Frequency Provider Last Rate Last Dose  . [START ON 03/13/2017] diphenhydrAMINE (BENADRYL) capsule 50 mg  50 mg Oral Once Aletta Edouard, MD        Allergies:   Ace inhibitors; Heparin; Iohexol; Phenytoin; Wellbutrin [bupropion]; Meperidine hcl; Morphine; Penicillins; Valproic acid and related; Iodinated diagnostic agents; and Pentazocine lactate    ROS:  Please see the history of present illness.   Otherwise, review of systems are positive for recent cold symptoms.   All other systems are reviewed and negative.    PHYSICAL EXAM: VS:  BP (!) 158/84   Pulse 81   Ht 5\' 4"  (1.626 m)   Wt 168 lb (76.2 kg)   BMI 28.84 kg/m  , BMI Body mass index is 28.84 kg/m.  GENERAL:  Well appearing NECK:  No jugular venous distention, waveform within normal limits, carotid upstroke brisk and symmetric, no bruits, no thyromegaly LUNGS:  Clear to auscultation bilaterally CHEST:  Unremarkable HEART:  PMI not displaced or sustained,S1 and S2 within normal limits, no S3, no S4, no clicks, no rubs, 3 of 6 apical systolic murmur radiating slightly at the aortic outflow tract, no diastolic murmurs  ABD:  Flat, positive bowel sounds normal in frequency in  pitch, no bruits, no rebound, no guarding, no midline pulsatile mass, no hepatomegaly, no splenomegaly EXT:  2 plus pulses throughout, no edema, no cyanosis no clubbing, left arm  fistula   EKG:  EKG is  ordered today. The ekg ordered today demonstrates sinus rhythm, rate 69, old inferior infarct, poor anterior R wave progression, QTC slightly prolonged, no acute ST-T wave changes.  CATH  Diagnostic  Dominance: Right  Left Main  Mid LM to Dist LM lesion 45% stenosed  Mid LM to Dist LM lesion is 45% stenosed.  Left Circumflex  Ost Cx lesion 80% stenosed  Ost Cx lesion is 80% stenosed.  Mid Cx to Dist Cx lesion 75% stenosed  Mid Cx to Dist Cx lesion is 75% stenosed.  Right Coronary Artery  Ost RCA to Prox RCA lesion 60% stenosed  Ost RCA to Prox RCA lesion is 60% stenosed. The lesion was previously treated.  Prox RCA to Mid RCA lesion 85% stenosed  Prox RCA to Mid RCA lesion is 85% stenosed.  Intervention   No interventions have been documented    Recent Labs: 07/21/2016: ALT 11 11/20/2016: TSH 1.701 11/22/2016: BUN <5; Creatinine, Ser 3.50; Potassium 3.8; Sodium 134 11/23/2016: Hemoglobin 9.7; Platelets 139    Lipid Panel    Component Value Date/Time   CHOL 159 02/22/2011 1815   TRIG 140 02/22/2011 1815   HDL 44 02/22/2011 1815   CHOLHDL 3.6 02/22/2011 1815   VLDL 28 02/22/2011 1815   LDLCALC 87 02/22/2011 1815      Wt Readings from Last 3 Encounters:  03/01/17 168 lb (76.2 kg)  01/05/17 170 lb (77.1 kg)  11/23/16 169 lb (76.7 kg)      Other studies Reviewed: Additional studies/ records that were reviewed today include:    Hospital records Review of the above records demonstrates:         ASSESSMENT AND PLAN:  CHRONIC SYSTOLIC/DIASTOLIC HF:   She is euvolemic with volume managed by dialysis.  No change in therapy.  PULMONARY HTN:   She will remain on the medications as listed.  She is followed by pulmonary.  ATRIAL FIB:   Ms. KATINA REMICK has a CHA2DS2 - VASc score of 6.     She tolerates anticoagulation.  We will put her back on the Cardizem.  CAD: She has disease as described above but she is not having chest pain.  She  will continue with medical management.  TOBACCO ABUSE: She has stopped smoking again and she thinks for good.  DYSLIPIDEMIA:   LDL was recently 86 with an HDL of 55.  She will continue on the meds as listed.    PVD: No change in therapy is indicated.  CHEST PAIN:  She had obstructive RCA disease that was not amenable to percutaneous revascularization.  Plans as above.  She is no longer having chest pain.  HTN:  Dry weight was recently increased because of her blood pressure.  We will have to watch this as we restart the Cardizem.  Current medicines are reviewed at length with the patient today.  The patient does not have concerns regarding medicines.  The following changes have been made:  As above.   Labs/ tests ordered today include: None  No orders of the defined types were placed in this encounter.    Disposition:   FU with me 6 months.      Signed, Minus Breeding, MD  03/01/2017 4:12 PM    [facility]

## 2017-03-01 ENCOUNTER — Ambulatory Visit (INDEPENDENT_AMBULATORY_CARE_PROVIDER_SITE_OTHER): Payer: Medicare Other | Admitting: Cardiology

## 2017-03-01 ENCOUNTER — Ambulatory Visit (INDEPENDENT_AMBULATORY_CARE_PROVIDER_SITE_OTHER): Payer: Medicare Other | Admitting: Pharmacist Clinician (PhC)/ Clinical Pharmacy Specialist

## 2017-03-01 ENCOUNTER — Encounter: Payer: Self-pay | Admitting: Cardiology

## 2017-03-01 VITALS — BP 158/84 | HR 81 | Ht 64.0 in | Wt 168.0 lb

## 2017-03-01 DIAGNOSIS — I480 Paroxysmal atrial fibrillation: Secondary | ICD-10-CM

## 2017-03-01 DIAGNOSIS — I4891 Unspecified atrial fibrillation: Secondary | ICD-10-CM | POA: Diagnosis not present

## 2017-03-01 DIAGNOSIS — I5032 Chronic diastolic (congestive) heart failure: Secondary | ICD-10-CM | POA: Diagnosis not present

## 2017-03-01 DIAGNOSIS — E785 Hyperlipidemia, unspecified: Secondary | ICD-10-CM | POA: Diagnosis not present

## 2017-03-01 DIAGNOSIS — I272 Pulmonary hypertension, unspecified: Secondary | ICD-10-CM

## 2017-03-01 DIAGNOSIS — I10 Essential (primary) hypertension: Secondary | ICD-10-CM

## 2017-03-01 DIAGNOSIS — I2510 Atherosclerotic heart disease of native coronary artery without angina pectoris: Secondary | ICD-10-CM

## 2017-03-01 DIAGNOSIS — Z7901 Long term (current) use of anticoagulants: Secondary | ICD-10-CM

## 2017-03-01 LAB — POCT INR: INR: 1.1

## 2017-03-01 MED ORDER — DILTIAZEM HCL ER COATED BEADS 120 MG PO CP24: 120.0000 mg | ORAL_CAPSULE | Freq: Every day | ORAL | 11 refills | Status: DC

## 2017-03-01 NOTE — Patient Instructions (Signed)
Description   Take 2 tablets Thursday Feb 28 and Friday March 1, then increase dose to 1 tablet daily except 1.5 tablets each Monday and Friday.  Repeat INR in 10 days

## 2017-03-01 NOTE — Patient Instructions (Addendum)
Medication Instructions:  RESTART- Cardizem 120 mg daily  If you need a refill on your cardiac medications before your next appointment, please call your pharmacy.  Labwork: None Ordered   Testing/Procedures: None Ordered  Follow-Up: Your physician wants you to follow-up in: 6 Months. You should receive a reminder letter in the mail two months in advance. If you do not receive a letter, please call our office [phone].   Thank you for choosing [facility] at [facility]!!

## 2017-03-02 DIAGNOSIS — E119 Type 2 diabetes mellitus without complications: Secondary | ICD-10-CM | POA: Diagnosis not present

## 2017-03-02 DIAGNOSIS — Z992 Dependence on renal dialysis: Secondary | ICD-10-CM | POA: Diagnosis not present

## 2017-03-02 DIAGNOSIS — D631 Anemia in chronic kidney disease: Secondary | ICD-10-CM | POA: Diagnosis not present

## 2017-03-02 DIAGNOSIS — N2581 Secondary hyperparathyroidism of renal origin: Secondary | ICD-10-CM | POA: Diagnosis not present

## 2017-03-02 DIAGNOSIS — E1129 Type 2 diabetes mellitus with other diabetic kidney complication: Secondary | ICD-10-CM | POA: Diagnosis not present

## 2017-03-02 DIAGNOSIS — D509 Iron deficiency anemia, unspecified: Secondary | ICD-10-CM | POA: Diagnosis not present

## 2017-03-02 DIAGNOSIS — N186 End stage renal disease: Secondary | ICD-10-CM | POA: Diagnosis not present

## 2017-03-05 DIAGNOSIS — E119 Type 2 diabetes mellitus without complications: Secondary | ICD-10-CM | POA: Diagnosis not present

## 2017-03-05 DIAGNOSIS — N2581 Secondary hyperparathyroidism of renal origin: Secondary | ICD-10-CM | POA: Diagnosis not present

## 2017-03-05 DIAGNOSIS — N186 End stage renal disease: Secondary | ICD-10-CM | POA: Diagnosis not present

## 2017-03-05 DIAGNOSIS — D509 Iron deficiency anemia, unspecified: Secondary | ICD-10-CM | POA: Diagnosis not present

## 2017-03-05 DIAGNOSIS — D631 Anemia in chronic kidney disease: Secondary | ICD-10-CM | POA: Diagnosis not present

## 2017-03-05 NOTE — H&P (View-Only) (Signed)
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  Patient has previously had SFA stents placed that occluded.  I attempted a L CFA to AK pop bypass but had to abort when I could not clamp the common femoral artery due severity of calcification.  This patient's arterial calcification is consistent with calciphylaxis due to her long-term hemodialysis status.  The patient requiring drainage of a seroma in her above-the-knee exposure on 09/12/16.  The patient's symptoms have not progressed.  The patient's treatment regimen currently included: maximal medical management.  Reported her LUA AVG is failing and she  Has been told no further endovascular interventions are possible.  Past Medical History:  Diagnosis Date  . Anemia    History  of Blood transfusion  . Atrial fibrillation with RVR ([place]) 11/2016  . CAD (coronary artery disease)    stent to RCA  . Cancer ([place])    clear cell cancer, kidney  . Chronic renal insufficiency    On hemodialysis  . Complication of anesthesia 12/2010   pt is very confused, with AMS with anesthesia  . CVA 07/27/2008   CVA affected cognition and memory per family, no focal deficits.    . CVA (cerebral infarction) 2003   no apparent residual  . Diastolic congestive heart failure ([place])   . Dyslipidemia   . Encephalopathy   . ESRD 07/27/2008   ESRD due to HTN and NSAID's, started hemodialysis in 2005 in [place], [place]. Went to [facility] from 2010 to 2012 and since 2012 has been getting dialysis at [facility] on [address] in [place] on a MWF schedule. First access with RUA AVG placed in [place]. Next and current access was LUA AVG placed by Dr. Lucky Cowboy in [place] in or around 2012. Has had 2 or 3 procedures on that graft since placed per family. She gets her access work done here in [place] now. She is allergic to  heparin and does not get any heparin at dialysis; she had an allergic reaction apparently when in ICU in the past.      . GERD (gastroesophageal reflux disease)   . Hyperlipidemia   . Hypertension   . Hypothyroidism   . Positive PPD    completed rifampin  . Pulmonary hypertension ([place])   . PVD (peripheral vascular disease) ([place])   . Seizures ([place])    last seizure 6 years ago  . Sleep apnea    Sleep Study 2008  . Traumatic seroma of left lower leg     Past Surgical History:  Procedure Laterality Date  . ABDOMINAL AORTOGRAM W/LOWER EXTREMITY Bilateral 06/15/2016   Procedure: Abdominal Aortogram w/Lower Extremity;  Surgeon: Conrad Rowena, MD;  Location: [place] [facility];  Service: Cardiovascular;  Laterality: Bilateral;  . ABDOMINAL HYSTERECTOMY    . APPENDECTOMY    . APPLICATION OF WOUND VAC Left 09/12/2016   Procedure: APPLICATION OF WOUND VAC;  Surgeon: Conrad Bennington, MD;  Location: [place];  Service: Vascular;  Laterality: Left;  . ARTERY EXPLORATION Left 07/25/2016   Procedure: ARTERY EXPLORATION LEFT ABOVE KNEE POPLITEAL AND GROIN;  Surgeon: Conrad [place], MD;  Location: [facility];  Service: Vascular;  Laterality: Left;  . CARDIAC CATHETERIZATION     last 2016  . CHOLECYSTECTOMY    . D&Cs    . HEMATOMA EVACUATION Left 09/12/2016   Procedure: EVACUATION HEMATOMA;  Surgeon: Conrad Victor, MD;  Location: [facility];  Service: Vascular;  Laterality: Left;  . LEFT HEART CATH AND CORONARY ANGIOGRAPHY N/A 11/22/2016   Procedure: LEFT HEART CATH AND CORONARY ANGIOGRAPHY;  Surgeon: Belva Crome, MD;  Location: [facility];  Service: Cardiovascular;  Laterality: N/A;  . LEFT HEART CATHETERIZATION WITH CORONARY ANGIOGRAM N/A 02/22/2011   Procedure: LEFT HEART CATHETERIZATION WITH CORONARY ANGIOGRAM;  Surgeon: Wellington Hampshire, MD;  Location: [facility];  Service: Cardiovascular;  Laterality: N/A;  . left nephrectomy    . NEPHRECTOMY Left    Malignant tumor  . PERIPHERAL VASCULAR  CATHETERIZATION N/A 12/31/2014   Procedure: Abdominal Aortogram;  Surgeon: Conrad [place], MD;  Location: [facility];  Service: Cardiovascular;  Laterality: N/A;  . right ankle repair    . RIGHT HEART CATHETERIZATION N/A 01/29/2014   Procedure: RIGHT HEART CATH;  Surgeon: Larey Dresser, MD;  Location: [facility] CATH [facility];  Service: Cardiovascular;  Laterality: N/A;  . TONSILLECTOMY    . ULTRASOUND GUIDANCE FOR VASCULAR ACCESS  11/22/2016   Procedure: Ultrasound Guidance For Vascular Access;  Surgeon: Belva Crome, MD;  Location: [place];  Service: Cardiovascular;;  . VASCULAR SURGERY  11/2010   graft inserted to left arm    Social History   Socioeconomic History  . Marital status: Divorced    Spouse name: Not on file  . Number of children: 2  . Years of education: 71  . Highest education level: Not on file  Social Needs  . Financial resource strain: Not on file  . Food insecurity - worry: Not on file  . Food insecurity - inability: Not on file  . Transportation needs - medical: Not on file  . Transportation needs - non-medical: Not on file  Occupational History  . Occupation: Retired Optician, dispensing: RETIRED  Tobacco Use  . Smoking status: Current Some Day Smoker    Packs/day: 0.00    Years: 53.00    Pack years: 0.00    Types: Cigarettes  . Smokeless tobacco: Never Used  Substance and Sexual Activity  . Alcohol use: Yes    Alcohol/week: 0.0 oz    Comment: very rarely per pt  . Drug use: No    Comment: former marijuana use, several years  . Sexual activity: Not Currently    Birth control/protection: Post-menopausal  Other Topics Concern  . Not on file  Social History Narrative  . Not on file    Family History  Problem Relation Age of Onset  . Heart disease Father   . Hypertension Mother   . Dementia Mother   . Coronary artery disease Sister   . Heart attack Sister   . Hypertension Brother     Current Outpatient Medications  Medication Sig  Dispense Refill  . acetaminophen (TYLENOL) 500 MG tablet Take 1,000 mg by mouth daily as needed for mild pain.     [name] albuterol (PROVENTIL HFA;VENTOLIN HFA) 108 (90 Base) MCG/ACT inhaler Inhale 1-2 puffs into the lungs every 6 (six) hours as needed for wheezing or shortness of breath. 1 Inhaler 0  . carvedilol (COREG) 25 MG tablet Take 25 mg by mouth 2 (two) times daily with a meal.    . cilostazol (PLETAL) 100 MG tablet Take 1 tablet (100 mg total) by mouth 2 (two) times daily before a meal. 60 tablet 11  . diltiazem (CARDIZEM CD) 120 MG 24 hr capsule Take 1 capsule (120 mg  total) by mouth daily. 30 capsule 11  . diphenhydrAMINE (BENADRYL) 50 MG capsule Take 1 capsule (50 mg total) by mouth every Monday, Wednesday, and Friday. On dialysis days    . folic acid-vitamin b complex-vitamin c-selenium-zinc (DIALYVITE) 3 MG TABS tablet Take 1 tablet by mouth daily.    . hydrALAZINE (APRESOLINE) 25 MG tablet Take 25 mg 2 (two) times daily by mouth.     . isosorbide dinitrate (ISORDIL) 20 MG tablet Take 1 tablet (20 mg total) by mouth 3 (three) times daily. 270 tablet 3  . LETAIRIS 5 MG tablet TAKE 1 TABLET (5MG ) BY MOUTH DAILY. DO NOT HANDLE IF PREGNANT. DO NOTSPLIT, CRUSH, OR CHEW. AVOID INHALATION AND CONTACT WITH SKIN OR EYES. 30 tablet 11  . levETIRAcetam (KEPPRA) 500 MG tablet Take 500 mg by mouth daily at 8 pm.    . levothyroxine (SYNTHROID, LEVOTHROID) 150 MCG tablet Take 150 mcg by mouth daily before breakfast.    . nitroGLYCERIN (NITROSTAT) 0.4 MG SL tablet Place 1 tablet (0.4 mg total) under the tongue every 5 (five) minutes as needed for chest pain. 25 tablet 1  . omeprazole (PRILOSEC OTC) 20 MG tablet Take 20 mg by mouth daily.    . pravastatin (PRAVACHOL) 40 MG tablet Take 1 tablet (40 mg total) by mouth at bedtime. 90 tablet 3  . SENSIPAR 90 MG tablet Take 90 mg by mouth every Monday, Wednesday, and Friday with hemodialysis.     [name] traZODone (DESYREL) 50 MG tablet Take 50 mg by mouth at bedtime  as needed for sleep.     [name] warfarin (COUMADIN) 5 MG tablet Take 1 to 1 and 1/2 tablets daily or as directed by coumadin clinic 45 tablet 1  . oxyCODONE (ROXICODONE) 5 MG immediate release tablet Take 1 tablet (5 mg total) by mouth every 6 (six) hours as needed for severe pain. (Patient not taking: Reported on 03/07/2017) 30 tablet 0  . [START ON 03/12/2017] predniSONE (DELTASONE) 50 MG tablet Prednisone 50 mg po 13 hrs, 7 hrs and 1 hr prior to CT (scheduled 03/13/2017 at 8:30 am) Benadryl 50 mg po 1 hr prior to CT. (Patient not taking: Reported on 03/07/2017) 3 tablet 0   No current facility-administered medications for this visit.    Facility-Administered Medications Ordered in Other Visits  Medication Dose Route Frequency Provider Last Rate Last Dose  . [START ON 03/13/2017] diphenhydrAMINE (BENADRYL) capsule 50 mg  50 mg Oral Once Aletta Edouard, MD         Allergies  Allergen Reactions  . Ace Inhibitors Anaphylaxis and Rash  . Heparin Other (See Comments)    MDs told her not to take after reaction in ICU  . Iohexol Swelling and Other (See Comments)    1970s; passed out and had facial/tongue swelling.  Requires 13-hour prep with prednisone and benadryl  . Phenytoin Other (See Comments)    Had reaction while in ICU; doesn't know.  MDs told her not to take ever again.  . Wellbutrin [Bupropion] Other (See Comments)    seizures  . Meperidine Hcl Swelling and Rash    Makes tongue swell  . Morphine Rash  . Penicillins Hives and Rash    Has patient had a PCN reaction causing immediate rash, facial/tongue/throat swelling, SOB or lightheadedness with hypotension: Yes Has patient had a PCN reaction causing severe rash involving mucus membranes or skin necrosis: No Has patient had a PCN reaction that required hospitalization: No Has patient had a PCN reaction occurring  within the last 10 years: No If all of the above answers are "NO", then may proceed with Cephalosporin use.    . Valproic Acid  And Related Other (See Comments)    Confusion   . Iodinated Diagnostic Agents Other (See Comments)    Pre-meditate with benadryl and prednisone 3 times before appt.  . Pentazocine Lactate Other (See Comments)    Patient does not remember reaction to this med ([place]).     EVIEW OF SYSTEMS (negative unless checked):   Cardiac:  []  Chest pain or chest pressure? []  Shortness of breath upon activity? []  Shortness of breath when lying flat? []  Irregular heart rhythm?  Vascular:  []  Pain in calf, thigh, or hip brought on by walking? []  Pain in feet at night that wakes you up from your sleep? []  Blood clot in your veins? []  Leg swelling?  Pulmonary:  []  Oxygen at home? []  Productive cough? []  Wheezing?  Neurologic:  []  Sudden weakness in arms or legs? []  Sudden numbness in arms or legs? []  Sudden onset of difficult speaking or slurred speech? []  Temporary loss of vision in one eye? []  Problems with dizziness?  Gastrointestinal:  []  Blood in stool? []  Vomited blood?  Genitourinary:  []  Burning when urinating? []  Blood in urine?\ [x]   End stage renal disease-HD: M/W/F  Psychiatric:  []  Major depression  Hematologic:  []  Bleeding problems? []  Problems with blood clotting?  Dermatologic:  []  Rashes or ulcers?  Constitutional:  []  Fever or chills?  Ear/Nose/Throat:  []  Change in hearing? []  Nose bleeds? []  Sore throat?  Musculoskeletal:  []  Back pain? []  Joint pain? []  Muscle pain?   Physical Examination   Vitals:   03/07/17 1534 03/07/17 1541  BP: (!) 148/73 (!) 154/74  Pulse: 76 77  Resp: 16   Temp: 98.4 F (36.9 C)   TempSrc: Oral   SpO2: 99%   Weight: 161 lb (73 kg)   Height: 5\' 2"  (1.575 m)    Body mass index is 29.45 kg/m.  General Alert, O x 3, WD, NAD  Pulmonary Sym exp, good B air movt, CTA B  Cardiac RRR, Nl S1, S2, no Murmurs, No rubs, No S3,S4  Vascular Vessel Right Left  Radial Palpable Palpable  Brachial Palpable Palpable    Carotid Palpable, No Bruit Palpable, No Bruit  Aorta Not palpable N/A  Femoral Palpable Palpable  Popliteal Not palpable Not palpable  PT Not palpable Not palpable  DP Not palpable Not palpable    Gastro- intestinal soft, non-distended, non-tender to palpation, No guarding or rebound, no HSM, no masses, no CVAT B, No palpable prominent aortic pulse,    Musculo- skeletal M/S 5/5 throughout  , Extremities without ischemic changes  , No edema present, No visible varicosities , No Lipodermatosclerosis present, LUA AVG with some pseudoaneurysm changed, heavily calcified distal segment palpable  Neurologic Pain and light touch intact in extremities , Motor exam as listed above    Non-Invasive Vascular imaging   ABI (03/07/2017)  R:   ABI: [place] ([place]),   PT: mono  DP: mono  TBI:  0.22  L:   ABI: [place] (),   PT: mono  DP: mono  TBI: 0   Medical Decision Making   [name] is a 72 y.o. female who presents with:  bilateral leg intermittent claudication without evidence of critical limb ischemia, calciphylaxis, ESRD on hemodialysis   Based on the patient's vascular studies and examination, I have offered the patient: B arm  and central venogram. Risk of extremity and central venography include but are not limited to: anaphylaxis, bleeding, infection, and incomplete visualization of the venous system. The patient is aware of these risks and has elected to proceed.  Based on the findings of the venogram, will plan on next arteriovenous graft..  I discussed in depth with the patient the nature of atherosclerosis, and emphasized the importance of maximal medical management including strict control of blood pressure, blood glucose, and lipid levels, antiplatelet agents, obtaining regular exercise, and cessation of smoking.    The patient is aware that without maximal medical management the underlying atherosclerotic disease process will progress, limiting the benefit of any  interventions. The patient is currently on a statin: Pravachol.  The patient is currently not on an anti-platelet due to bleeding risks related to use of an anticoagulant.  Patient is taking Warfarin.  Thank you for allowing [place] to participate in this patient's care.   Adele Barthel, MD, FACS [facility] Office: [phone] Pager: [phone]

## 2017-03-05 NOTE — Progress Notes (Signed)
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  Patient has previously had SFA stents placed that occluded.  I attempted a L CFA to AK pop bypass but had to abort when I could not clamp the common femoral artery due severity of calcification.  This patient's arterial calcification is consistent with calciphylaxis due to her long-term hemodialysis status.  The patient requiring drainage of a seroma in her above-the-knee exposure on 09/12/16.  The patient's symptoms have not progressed.  The patient's treatment regimen currently included: maximal medical management.  Reported her LUA AVG is failing and she  Has been told no further endovascular interventions are possible.  Past Medical History:  Diagnosis Date  . Anemia    History  of Blood transfusion  . Atrial fibrillation with RVR ([place]) 11/2016  . CAD (coronary artery disease)    stent to RCA  . Cancer ([place])    clear cell cancer, kidney  . Chronic renal insufficiency    On hemodialysis  . Complication of anesthesia 12/2010   pt is very confused, with AMS with anesthesia  . CVA 07/27/2008   CVA affected cognition and memory per family, no focal deficits.    . CVA (cerebral infarction) 2003   no apparent residual  . Diastolic congestive heart failure ([place])   . Dyslipidemia   . Encephalopathy   . ESRD 07/27/2008   ESRD due to HTN and NSAID's, started hemodialysis in 2005 in [place], [place]. Went to [facility] from 2010 to 2012 and since 2012 has been getting dialysis at [facility] on [address] in [place] on a MWF schedule. First access with RUA AVG placed in [place]. Next and current access was LUA AVG placed by Dr. Lucky Cowboy in [place] in or around 2012. Has had 2 or 3 procedures on that graft since placed per family. She gets her access work done here in [place] now. She is allergic to  heparin and does not get any heparin at dialysis; she had an allergic reaction apparently when in ICU in the past.      . GERD (gastroesophageal reflux disease)   . Hyperlipidemia   . Hypertension   . Hypothyroidism   . Positive PPD    completed rifampin  . Pulmonary hypertension ([place])   . PVD (peripheral vascular disease) ([place])   . Seizures ([place])    last seizure 6 years ago  . Sleep apnea    Sleep Study 2008  . Traumatic seroma of left lower leg     Past Surgical History:  Procedure Laterality Date  . ABDOMINAL AORTOGRAM W/LOWER EXTREMITY Bilateral 06/15/2016   Procedure: Abdominal Aortogram w/Lower Extremity;  Surgeon: Conrad Gates, MD;  Location: [place] [facility];  Service: Cardiovascular;  Laterality: Bilateral;  . ABDOMINAL HYSTERECTOMY    . APPENDECTOMY    . APPLICATION OF WOUND VAC Left 09/12/2016   Procedure: APPLICATION OF WOUND VAC;  Surgeon: Conrad [place], MD;  Location: [facility];  Service: Vascular;  Laterality: Left;  . ARTERY EXPLORATION Left 07/25/2016   Procedure: ARTERY EXPLORATION LEFT ABOVE KNEE POPLITEAL AND GROIN;  Surgeon: Conrad Beemer, MD;  Location: [facility];  Service: Vascular;  Laterality: Left;  . CARDIAC CATHETERIZATION     last 2016  . CHOLECYSTECTOMY    . D&Cs    . HEMATOMA EVACUATION Left 09/12/2016   Procedure: EVACUATION HEMATOMA;  Surgeon: Conrad [place], MD;  Location: [facility];  Service: Vascular;  Laterality: Left;  . LEFT HEART CATH AND CORONARY ANGIOGRAPHY N/A 11/22/2016   Procedure: LEFT HEART CATH AND CORONARY ANGIOGRAPHY;  Surgeon: Belva Crome, MD;  Location: [facility];  Service: Cardiovascular;  Laterality: N/A;  . LEFT HEART CATHETERIZATION WITH CORONARY ANGIOGRAM N/A 02/22/2011   Procedure: LEFT HEART CATHETERIZATION WITH CORONARY ANGIOGRAM;  Surgeon: Wellington Hampshire, MD;  Location: [facility] C[facility];  Service: Cardiovascular;  Laterality: N/A;  . left nephrectomy    . NEPHRECTOMY Left    Malignant tumor  . PERIPHERAL VASCULAR  CATHETERIZATION N/A 12/31/2014   Procedure: Abdominal Aortogram;  Surgeon: Conrad [place], MD;  Location: [facility];  Service: Cardiovascular;  Laterality: N/A;  . right ankle repair    . RIGHT HEART CATHETERIZATION N/A 01/29/2014   Procedure: RIGHT HEART CATH;  Surgeon: Larey Dresser, MD;  Location: [facility];  Service: Cardiovascular;  Laterality: N/A;  . TONSILLECTOMY    . ULTRASOUND GUIDANCE FOR VASCULAR ACCESS  11/22/2016   Procedure: Ultrasound Guidance For Vascular Access;  Surgeon: Belva Crome, MD;  Location: [place] [facility];  Service: Cardiovascular;;  . VASCULAR SURGERY  11/2010   graft inserted to left arm    Social History   Socioeconomic History  . Marital status: Divorced    Spouse name: Not on file  . Number of children: 2  . Years of education: 27  . Highest education level: Not on file  Social Needs  . Financial resource strain: Not on file  . Food insecurity - worry: Not on file  . Food insecurity - inability: Not on file  . Transportation needs - medical: Not on file  . Transportation needs - non-medical: Not on file  Occupational History  . Occupation: Retired Optician, dispensing: RETIRED  Tobacco Use  . Smoking status: Current Some Day Smoker    Packs/day: 0.00    Years: 53.00    Pack years: 0.00    Types: Cigarettes  . Smokeless tobacco: Never Used  Substance and Sexual Activity  . Alcohol use: Yes    Alcohol/week: 0.0 oz    Comment: very rarely per pt  . Drug use: No    Comment: former marijuana use, several years  . Sexual activity: Not Currently    Birth control/protection: Post-menopausal  Other Topics Concern  . Not on file  Social History Narrative  . Not on file    Family History  Problem Relation Age of Onset  . Heart disease Father   . Hypertension Mother   . Dementia Mother   . Coronary artery disease Sister   . Heart attack Sister   . Hypertension Brother     Current Outpatient Medications  Medication Sig  Dispense Refill  . acetaminophen (TYLENOL) 500 MG tablet Take 1,000 mg by mouth daily as needed for mild pain.     [name] albuterol (PROVENTIL HFA;VENTOLIN HFA) 108 (90 Base) MCG/ACT inhaler Inhale 1-2 puffs into the lungs every 6 (six) hours as needed for wheezing or shortness of breath. 1 Inhaler 0  . carvedilol (COREG) 25 MG tablet Take 25 mg by mouth 2 (two) times daily with a meal.    . cilostazol (PLETAL) 100 MG tablet Take 1 tablet (100 mg total) by mouth 2 (two) times daily before a meal. 60 tablet 11  . diltiazem (CARDIZEM CD) 120 MG 24 hr capsule Take 1 capsule (120 mg  total) by mouth daily. 30 capsule 11  . diphenhydrAMINE (BENADRYL) 50 MG capsule Take 1 capsule (50 mg total) by mouth every Monday, Wednesday, and Friday. On dialysis days    . folic acid-vitamin b complex-vitamin c-selenium-zinc (DIALYVITE) 3 MG TABS tablet Take 1 tablet by mouth daily.    . hydrALAZINE (APRESOLINE) 25 MG tablet Take 25 mg 2 (two) times daily by mouth.     . isosorbide dinitrate (ISORDIL) 20 MG tablet Take 1 tablet (20 mg total) by mouth 3 (three) times daily. 270 tablet 3  . LETAIRIS 5 MG tablet TAKE 1 TABLET (5MG ) BY MOUTH DAILY. DO NOT HANDLE IF PREGNANT. DO NOTSPLIT, CRUSH, OR CHEW. AVOID INHALATION AND CONTACT WITH SKIN OR EYES. 30 tablet 11  . levETIRAcetam (KEPPRA) 500 MG tablet Take 500 mg by mouth daily at 8 pm.    . levothyroxine (SYNTHROID, LEVOTHROID) 150 MCG tablet Take 150 mcg by mouth daily before breakfast.    . nitroGLYCERIN (NITROSTAT) 0.4 MG SL tablet Place 1 tablet (0.4 mg total) under the tongue every 5 (five) minutes as needed for chest pain. 25 tablet 1  . omeprazole (PRILOSEC OTC) 20 MG tablet Take 20 mg by mouth daily.    . pravastatin (PRAVACHOL) 40 MG tablet Take 1 tablet (40 mg total) by mouth at bedtime. 90 tablet 3  . SENSIPAR 90 MG tablet Take 90 mg by mouth every Monday, Wednesday, and Friday with hemodialysis.     [name] traZODone (DESYREL) 50 MG tablet Take 50 mg by mouth at bedtime  as needed for sleep.     [name] warfarin (COUMADIN) 5 MG tablet Take 1 to 1 and 1/2 tablets daily or as directed by coumadin clinic 45 tablet 1  . oxyCODONE (ROXICODONE) 5 MG immediate release tablet Take 1 tablet (5 mg total) by mouth every 6 (six) hours as needed for severe pain. (Patient not taking: Reported on 03/07/2017) 30 tablet 0  . [START ON 03/12/2017] predniSONE (DELTASONE) 50 MG tablet Prednisone 50 mg po 13 hrs, 7 hrs and 1 hr prior to CT (scheduled 03/13/2017 at 8:30 am) Benadryl 50 mg po 1 hr prior to CT. (Patient not taking: Reported on 03/07/2017) 3 tablet 0   No current facility-administered medications for this visit.    Facility-Administered Medications Ordered in Other Visits  Medication Dose Route Frequency Provider Last Rate Last Dose  . [START ON 03/13/2017] diphenhydrAMINE (BENADRYL) capsule 50 mg  50 mg Oral Once Aletta Edouard, MD         Allergies  Allergen Reactions  . Ace Inhibitors Anaphylaxis and Rash  . Heparin Other (See Comments)    MDs told her not to take after reaction in ICU  . Iohexol Swelling and Other (See Comments)    1970s; passed out and had facial/tongue swelling.  Requires 13-hour prep with prednisone and benadryl  . Phenytoin Other (See Comments)    Had reaction while in ICU; doesn't know.  MDs told her not to take ever again.  . Wellbutrin [Bupropion] Other (See Comments)    seizures  . Meperidine Hcl Swelling and Rash    Makes tongue swell  . Morphine Rash  . Penicillins Hives and Rash    Has patient had a PCN reaction causing immediate rash, facial/tongue/throat swelling, SOB or lightheadedness with hypotension: Yes Has patient had a PCN reaction causing severe rash involving mucus membranes or skin necrosis: No Has patient had a PCN reaction that required hospitalization: No Has patient had a PCN reaction occurring  within the last 10 years: No If all of the above answers are "NO", then may proceed with Cephalosporin use.    . Valproic Acid  And Related Other (See Comments)    Confusion   . Iodinated Diagnostic Agents Other (See Comments)    Pre-meditate with benadryl and prednisone 3 times before appt.  . Pentazocine Lactate Other (See Comments)    Patient does not remember reaction to this med ([place]).     EVIEW OF SYSTEMS (negative unless checked):   Cardiac:  []  Chest pain or chest pressure? []  Shortness of breath upon activity? []  Shortness of breath when lying flat? []  Irregular heart rhythm?  Vascular:  []  Pain in calf, thigh, or hip brought on by walking? []  Pain in feet at night that wakes you up from your sleep? []  Blood clot in your veins? []  Leg swelling?  Pulmonary:  []  Oxygen at home? []  Productive cough? []  Wheezing?  Neurologic:  []  Sudden weakness in arms or legs? []  Sudden numbness in arms or legs? []  Sudden onset of difficult speaking or slurred speech? []  Temporary loss of vision in one eye? []  Problems with dizziness?  Gastrointestinal:  []  Blood in stool? []  Vomited blood?  Genitourinary:  []  Burning when urinating? []  Blood in urine?\ [x]   End stage renal disease-HD: M/W/F  Psychiatric:  []  Major depression  Hematologic:  []  Bleeding problems? []  Problems with blood clotting?  Dermatologic:  []  Rashes or ulcers?  Constitutional:  []  Fever or chills?  Ear/Nose/Throat:  []  Change in hearing? []  Nose bleeds? []  Sore throat?  Musculoskeletal:  []  Back pain? []  Joint pain? []  Muscle pain?   Physical Examination   Vitals:   03/07/17 1534 03/07/17 1541  BP: (!) 148/73 (!) 154/74  Pulse: 76 77  Resp: 16   Temp: 98.4 F (36.9 C)   TempSrc: Oral   SpO2: 99%   Weight: 161 lb (73 kg)   Height: 5\' 2"  (1.575 m)    Body mass index is 29.45 kg/m.  General Alert, O x 3, WD, NAD  Pulmonary Sym exp, good B air movt, CTA B  Cardiac RRR, Nl S1, S2, no Murmurs, No rubs, No S3,S4  Vascular Vessel Right Left  Radial Palpable Palpable  Brachial Palpable Palpable    Carotid Palpable, No Bruit Palpable, No Bruit  Aorta Not palpable N/A  Femoral Palpable Palpable  Popliteal Not palpable Not palpable  PT Not palpable Not palpable  DP Not palpable Not palpable    Gastro- intestinal soft, non-distended, non-tender to palpation, No guarding or rebound, no HSM, no masses, no CVAT B, No palpable prominent aortic pulse,    Musculo- skeletal M/S 5/5 throughout  , Extremities without ischemic changes  , No edema present, No visible varicosities , No Lipodermatosclerosis present, LUA AVG with some pseudoaneurysm changed, heavily calcified distal segment palpable  Neurologic Pain and light touch intact in extremities , Motor exam as listed above    Non-Invasive Vascular imaging   ABI (03/07/2017)  R:   ABI: [place] ([place]),   PT: mono  DP: mono  TBI:  0.22  L:   ABI: [place] ([place]),   PT: mono  DP: mono  TBI: 0   Medical Decision Making   [name] is a 72 y.o. female who presents with:  bilateral leg intermittent claudication without evidence of critical limb ischemia, calciphylaxis, ESRD on hemodialysis   Based on the patient's vascular studies and examination, I have offered the patient: B arm  and central venogram. Risk of extremity and central venography include but are not limited to: anaphylaxis, bleeding, infection, and incomplete visualization of the venous system. The patient is aware of these risks and has elected to proceed.  Based on the findings of the venogram, will plan on next arteriovenous graft..  I discussed in depth with the patient the nature of atherosclerosis, and emphasized the importance of maximal medical management including strict control of blood pressure, blood glucose, and lipid levels, antiplatelet agents, obtaining regular exercise, and cessation of smoking.    The patient is aware that without maximal medical management the underlying atherosclerotic disease process will progress, limiting the benefit of any  interventions. The patient is currently on a statin: Pravachol.  The patient is currently not on an anti-platelet due to bleeding risks related to use of an anticoagulant.  Patient is taking Warfarin.  Thank you for allowing [place] to participate in this patient's care.   Adele Barthel, MD, FACS [facility] Office: [phone] Pager: [phone]

## 2017-03-07 ENCOUNTER — Encounter: Payer: Self-pay | Admitting: Vascular Surgery

## 2017-03-07 ENCOUNTER — Other Ambulatory Visit: Payer: Self-pay

## 2017-03-07 ENCOUNTER — Ambulatory Visit (HOSPITAL_COMMUNITY)
Admission: RE | Admit: 2017-03-07 | Discharge: 2017-03-07 | Disposition: A | Discharge status: Home / Self Care | Payer: Medicare Other | Source: Ambulatory Visit | Attending: Vascular Surgery | Admitting: Vascular Surgery

## 2017-03-07 ENCOUNTER — Ambulatory Visit (INDEPENDENT_AMBULATORY_CARE_PROVIDER_SITE_OTHER): Payer: Medicare Other | Admitting: Vascular Surgery

## 2017-03-07 ENCOUNTER — Encounter: Payer: Self-pay | Admitting: *Deleted

## 2017-03-07 VITALS — BP 154/74 | HR 77 | Temp 98.4°F | Resp 16 | Ht 62.0 in | Wt 161.0 lb

## 2017-03-07 DIAGNOSIS — F17200 Nicotine dependence, unspecified, uncomplicated: Secondary | ICD-10-CM | POA: Insufficient documentation

## 2017-03-07 DIAGNOSIS — E785 Hyperlipidemia, unspecified: Secondary | ICD-10-CM | POA: Diagnosis not present

## 2017-03-07 DIAGNOSIS — D509 Iron deficiency anemia, unspecified: Secondary | ICD-10-CM | POA: Diagnosis not present

## 2017-03-07 DIAGNOSIS — N186 End stage renal disease: Secondary | ICD-10-CM | POA: Diagnosis not present

## 2017-03-07 DIAGNOSIS — I70213 Atherosclerosis of native arteries of extremities with intermittent claudication, bilateral legs: Secondary | ICD-10-CM

## 2017-03-07 DIAGNOSIS — I10 Essential (primary) hypertension: Secondary | ICD-10-CM | POA: Diagnosis not present

## 2017-03-07 DIAGNOSIS — D631 Anemia in chronic kidney disease: Secondary | ICD-10-CM | POA: Diagnosis not present

## 2017-03-07 DIAGNOSIS — I2510 Atherosclerotic heart disease of native coronary artery without angina pectoris: Secondary | ICD-10-CM | POA: Insufficient documentation

## 2017-03-07 DIAGNOSIS — N2581 Secondary hyperparathyroidism of renal origin: Secondary | ICD-10-CM | POA: Diagnosis not present

## 2017-03-07 DIAGNOSIS — E119 Type 2 diabetes mellitus without complications: Secondary | ICD-10-CM | POA: Diagnosis not present

## 2017-03-07 DIAGNOSIS — I482 Chronic atrial fibrillation: Secondary | ICD-10-CM | POA: Diagnosis not present

## 2017-03-08 ENCOUNTER — Other Ambulatory Visit: Payer: Self-pay | Admitting: *Deleted

## 2017-03-08 ENCOUNTER — Encounter: Payer: Self-pay | Admitting: *Deleted

## 2017-03-08 ENCOUNTER — Telehealth: Payer: Self-pay | Admitting: *Deleted

## 2017-03-08 DIAGNOSIS — N2889 Other specified disorders of kidney and ureter: Secondary | ICD-10-CM

## 2017-03-08 NOTE — Progress Notes (Signed)
Procedure time changed to accommodate blood work as instructed by Crystal @[facility]  Lab.

## 2017-03-08 NOTE — Telephone Encounter (Signed)
Call to patient instructed to be at [facility] admitting at 6:30 am for procedure. Instructed to also take her Lataris with the other am Meds listed in letter of instructions to be taken am of procedure with sips of water. No other changes to instructions. Verbalized understanding.

## 2017-03-09 DIAGNOSIS — D631 Anemia in chronic kidney disease: Secondary | ICD-10-CM | POA: Diagnosis not present

## 2017-03-09 DIAGNOSIS — N186 End stage renal disease: Secondary | ICD-10-CM | POA: Diagnosis not present

## 2017-03-09 DIAGNOSIS — D509 Iron deficiency anemia, unspecified: Secondary | ICD-10-CM | POA: Diagnosis not present

## 2017-03-09 DIAGNOSIS — E119 Type 2 diabetes mellitus without complications: Secondary | ICD-10-CM | POA: Diagnosis not present

## 2017-03-09 DIAGNOSIS — N2581 Secondary hyperparathyroidism of renal origin: Secondary | ICD-10-CM | POA: Diagnosis not present

## 2017-03-12 DIAGNOSIS — D509 Iron deficiency anemia, unspecified: Secondary | ICD-10-CM | POA: Diagnosis not present

## 2017-03-12 DIAGNOSIS — E119 Type 2 diabetes mellitus without complications: Secondary | ICD-10-CM | POA: Diagnosis not present

## 2017-03-12 DIAGNOSIS — N186 End stage renal disease: Secondary | ICD-10-CM | POA: Diagnosis not present

## 2017-03-12 DIAGNOSIS — D631 Anemia in chronic kidney disease: Secondary | ICD-10-CM | POA: Diagnosis not present

## 2017-03-12 DIAGNOSIS — N2581 Secondary hyperparathyroidism of renal origin: Secondary | ICD-10-CM | POA: Diagnosis not present

## 2017-03-13 ENCOUNTER — Ambulatory Visit (HOSPITAL_COMMUNITY)
Admission: RE | Admit: 2017-03-13 | Discharge: 2017-03-13 | Disposition: A | Discharge status: Home / Self Care | Payer: Medicare Other | Source: Ambulatory Visit | Attending: Interventional Radiology | Admitting: Interventional Radiology

## 2017-03-13 ENCOUNTER — Ambulatory Visit (INDEPENDENT_AMBULATORY_CARE_PROVIDER_SITE_OTHER): Payer: Medicare Other | Admitting: Pharmacist

## 2017-03-13 ENCOUNTER — Ambulatory Visit
Admission: RE | Admit: 2017-03-13 | Discharge: 2017-03-13 | Disposition: A | Discharge status: Home / Self Care | Payer: Medicare Other | Source: Ambulatory Visit | Attending: Interventional Radiology | Admitting: Interventional Radiology

## 2017-03-13 ENCOUNTER — Telehealth: Payer: Self-pay | Admitting: *Deleted

## 2017-03-13 ENCOUNTER — Encounter: Payer: Self-pay | Admitting: Radiology

## 2017-03-13 DIAGNOSIS — D3001 Benign neoplasm of right kidney: Secondary | ICD-10-CM | POA: Diagnosis not present

## 2017-03-13 DIAGNOSIS — D179 Benign lipomatous neoplasm, unspecified: Secondary | ICD-10-CM

## 2017-03-13 DIAGNOSIS — Z7901 Long term (current) use of anticoagulants: Secondary | ICD-10-CM | POA: Diagnosis not present

## 2017-03-13 DIAGNOSIS — I4891 Unspecified atrial fibrillation: Secondary | ICD-10-CM

## 2017-03-13 DIAGNOSIS — Z905 Acquired absence of kidney: Secondary | ICD-10-CM | POA: Insufficient documentation

## 2017-03-13 DIAGNOSIS — D1771 Benign lipomatous neoplasm of kidney: Secondary | ICD-10-CM | POA: Insufficient documentation

## 2017-03-13 DIAGNOSIS — N281 Cyst of kidney, acquired: Secondary | ICD-10-CM | POA: Insufficient documentation

## 2017-03-13 HISTORY — PX: IR RADIOLOGIST EVAL & MGMT: IMG5224

## 2017-03-13 LAB — POCT INR: INR: 1.2

## 2017-03-13 MED ORDER — SODIUM CHLORIDE 0.9 % IJ SOLN
INTRAMUSCULAR | Status: AC
  Filled 2017-03-13: qty 50

## 2017-03-13 MED ORDER — WARFARIN SODIUM 7.5 MG PO TABS: ORAL_TABLET | ORAL | 1 refills | Status: DC

## 2017-03-13 MED ORDER — IOPAMIDOL (ISOVUE-370) INJECTION 76%
100.0000 mL | Freq: Once | INTRAVENOUS | Status: AC | PRN
  Administered 2017-03-13: 100 mL via INTRAVENOUS

## 2017-03-13 MED ORDER — IOPAMIDOL (ISOVUE-300) INJECTION 61%
INTRAVENOUS | Status: AC
  Filled 2017-03-13: qty 100

## 2017-03-13 NOTE — Progress Notes (Signed)
Chief Complaint: Follow-up of enlarging right renal angiomyolipoma.  History of Present Illness: [name] is a 72 y.o. female with a history of left nephrectomy for renal carcinoma who had a 4 year period of surveillance of complex cystic lesions of the right kidney which were stable over that time. It was evident over time that there was an enlarging angiomyolipoma emanating from the tip of the lower pole of the right kidney and growing inferiorly into the retroperitoneum. This measured nearly 11 cm in maximal diameter on the prior CT dated 06/08/2015. She was asymptomatic at that time and did not want to pursue transcatheter embolization. Follow-up has not been performed since that time due to a number of intervening medical problems. She is scheduled to undergo venography and possible dialysis graft revision versus new dialysis graft placement by Dr. Bridgett Larsson on 03/22/2017.  [name] remains asymptomatic with respect to the right renal angiomyolipoma. She denies any flank pain or hematuria. She is now on Coumadin.  Cardiac issues are managed by Dr. Percival Spanish, including severe obstructive disease of the RCA which is not amenable to revascularization.  Past Medical History:  Diagnosis Date  . Anemia    History  of Blood transfusion  . Atrial fibrillation with RVR ([place]) 11/2016  . CAD (coronary artery disease)    stent to RCA  . Cancer ([place])    clear cell cancer, kidney  . Chronic renal insufficiency    On hemodialysis  . Complication of anesthesia 12/2010   pt is very confused, with AMS with anesthesia  . CVA 07/27/2008   CVA affected cognition and memory per family, no focal deficits.    . CVA (cerebral infarction) 2003   no apparent residual  . Diastolic congestive heart failure ([place])   . Dyslipidemia   . Encephalopathy   . ESRD 07/27/2008   ESRD due to HTN and NSAID's, started hemodialysis in 2005 in [place], [place]. Went to [facility] from 2010 to 2012 and since 2012  has been getting dialysis at [facility] on [address] in [place] on a MWF schedule. First access with RUA AVG placed in [place]. Next and current access was LUA AVG placed by Dr. Lucky Cowboy in [place] in or around 2012. Has had 2 or 3 procedures on that graft since placed per family. She gets her access work done here in [place] now. She is allergic to heparin and does not get any heparin at dialysis; she had an allergic reaction apparently when in ICU in the past.      . GERD (gastroesophageal reflux disease)   . Hyperlipidemia   . Hypertension   . Hypothyroidism   . Positive PPD    completed rifampin  . Pulmonary hypertension ([place])   . PVD (peripheral vascular disease) ([place])   . Seizures ([place])    last seizure 6 years ago  . Sleep apnea    Sleep Study 2008  . Traumatic seroma of left lower leg     Past Surgical History:  Procedure Laterality Date  . ABDOMINAL AORTOGRAM W/LOWER EXTREMITY Bilateral 06/15/2016   Procedure: Abdominal Aortogram w/Lower Extremity;  Surgeon: Conrad Rudd, MD;  Location: [facility] [facility];  Service: Cardiovascular;  Laterality: Bilateral;  . ABDOMINAL HYSTERECTOMY    . APPENDECTOMY    . APPLICATION OF WOUND VAC Left 09/12/2016   Procedure: APPLICATION OF WOUND VAC;  Surgeon: Conrad Dale, MD;  Location: [facility];  Service: Vascular;  Laterality: Left;  . ARTERY EXPLORATION Left  07/25/2016   Procedure: ARTERY EXPLORATION LEFT ABOVE KNEE POPLITEAL AND GROIN;  Surgeon: Conrad Hamblen, MD;  Location: [facility];  Service: Vascular;  Laterality: Left;  . CARDIAC CATHETERIZATION     last 2016  . CHOLECYSTECTOMY    . D&Cs    . HEMATOMA EVACUATION Left 09/12/2016   Procedure: EVACUATION HEMATOMA;  Surgeon: Conrad Cooper, MD;  Location: [place];  Service: Vascular;  Laterality: Left;  . LEFT HEART CATH AND CORONARY ANGIOGRAPHY N/A 11/22/2016   Procedure: LEFT HEART CATH AND CORONARY ANGIOGRAPHY;  Surgeon: Belva Crome, MD;  Location: [facility];  Service:  Cardiovascular;  Laterality: N/A;  . LEFT HEART CATHETERIZATION WITH CORONARY ANGIOGRAM N/A 02/22/2011   Procedure: LEFT HEART CATHETERIZATION WITH CORONARY ANGIOGRAM;  Surgeon: Wellington Hampshire, MD;  Location: [facility];  Service: Cardiovascular;  Laterality: N/A;  . left nephrectomy    . NEPHRECTOMY Left    Malignant tumor  . PERIPHERAL VASCULAR CATHETERIZATION N/A 12/31/2014   Procedure: Abdominal Aortogram;  Surgeon: Conrad El Ojo, MD;  Location: [facility];  Service: Cardiovascular;  Laterality: N/A;  . right ankle repair    . RIGHT HEART CATHETERIZATION N/A 01/29/2014   Procedure: RIGHT HEART CATH;  Surgeon: Larey Dresser, MD;  Location: [facility];  Service: Cardiovascular;  Laterality: N/A;  . TONSILLECTOMY    . ULTRASOUND GUIDANCE FOR VASCULAR ACCESS  11/22/2016   Procedure: Ultrasound Guidance For Vascular Access;  Surgeon: Belva Crome, MD;  Location: [facility];  Service: Cardiovascular;;  . VASCULAR SURGERY  11/2010   graft inserted to left arm    Allergies: Ace inhibitors; Heparin; Iohexol; Phenytoin; Wellbutrin [bupropion]; Meperidine hcl; Morphine; Penicillins; Valproic acid and related; Iodinated diagnostic agents; and Pentazocine lactate  Medications: Prior to Admission medications   Medication Sig Start Date End Date Taking? Authorizing Provider  acetaminophen (TYLENOL) 500 MG tablet Take 1,000 mg by mouth daily as needed for mild pain.     [provider]  albuterol (PROVENTIL HFA;VENTOLIN HFA) 108 (90 Base) MCG/ACT inhaler Inhale 1-2 puffs into the lungs every 6 (six) hours as needed for wheezing or shortness of breath. 01/05/17   Marney Setting, NP  carvedilol (COREG) 25 MG tablet Take 25 mg by mouth 2 (two) times daily with a meal.    [provider]  cilostazol (PLETAL) 100 MG tablet Take 1 tablet (100 mg total) by mouth 2 (two) times daily before a meal. 05/24/16   Conrad Lincoln, MD  diltiazem (CARDIZEM CD) 120 MG 24 hr  capsule Take 1 capsule (120 mg total) by mouth daily. 03/01/17   Minus Breeding, MD  diphenhydrAMINE (BENADRYL) 50 MG capsule Take 1 capsule (50 mg total) by mouth every Monday, Wednesday, and Friday. On dialysis days 09/15/16   Gabriel Earing, PA-C  folic acid-vitamin b complex-vitamin c-selenium-zinc (DIALYVITE) 3 MG TABS tablet Take 1 tablet by mouth daily.    [provider]  hydrALAZINE (APRESOLINE) 25 MG tablet Take 25 mg 2 (two) times daily by mouth.     [provider]  isosorbide dinitrate (ISORDIL) 20 MG tablet Take 1 tablet (20 mg total) by mouth 3 (three) times daily. 10/26/16   Minus Breeding, MD  LETAIRIS 5 MG tablet TAKE 1 TABLET ('5MG'$ ) BY MOUTH DAILY. DO NOT HANDLE IF PREGNANT. DO NOTSPLIT, CRUSH, OR CHEW. AVOID INHALATION AND CONTACT WITH SKIN OR EYES. 09/18/16   Juanito Doom, MD  levETIRAcetam (KEPPRA) 500 MG  tablet Take 500 mg by mouth daily at 8 pm.    [provider]  levothyroxine (SYNTHROID, LEVOTHROID) 150 MCG tablet Take 150 mcg by mouth daily before breakfast.    [provider]  nitroGLYCERIN (NITROSTAT) 0.4 MG SL tablet Place 1 tablet (0.4 mg total) under the tongue every 5 (five) minutes as needed for chest pain. 10/26/16   Minus Breeding, MD  omeprazole (PRILOSEC OTC) 20 MG tablet Take 20 mg by mouth daily.    [provider]  oxyCODONE (ROXICODONE) 5 MG immediate release tablet Take 1 tablet (5 mg total) by mouth every 6 (six) hours as needed for severe pain. Patient not taking: Reported on 03/07/2017 09/14/16   Gabriel Earing, PA-C  pravastatin (PRAVACHOL) 40 MG tablet Take 1 tablet (40 mg total) by mouth at bedtime. 05/23/16   Minus Breeding, MD  predniSONE (DELTASONE) 50 MG tablet Prednisone 50 mg po 13 hrs, 7 hrs and 1 hr prior to CT (scheduled 03/13/2017 at 8:30 am) Benadryl 50 mg po 1 hr prior to CT. Patient not taking: Reported on 03/07/2017 03/12/17   Aletta Edouard, MD  SENSIPAR 90 MG tablet Take 90 mg by mouth  every Monday, Wednesday, and Friday with hemodialysis.  03/05/14   [provider]  traZODone (DESYREL) 50 MG tablet Take 50 mg by mouth at bedtime as needed for sleep.     [provider]  warfarin (COUMADIN) 5 MG tablet Take 1 to 1 and 1/2 tablets daily or as directed by coumadin clinic 01/15/17   Minus Breeding, MD     Family History  Problem Relation Age of Onset  . Heart disease Father   . Hypertension Mother   . Dementia Mother   . Coronary artery disease Sister   . Heart attack Sister   . Hypertension Brother     Social History   Socioeconomic History  . Marital status: Divorced    Spouse name: Not on file  . Number of children: 2  . Years of education: 98  . Highest education level: Not on file  Social Needs  . Financial resource strain: Not on file  . Food insecurity - worry: Not on file  . Food insecurity - inability: Not on file  . Transportation needs - medical: Not on file  . Transportation needs - non-medical: Not on file  Occupational History  . Occupation: Retired Optician, dispensing: RETIRED  Tobacco Use  . Smoking status: Current Some Day Smoker    Packs/day: 0.00    Years: 53.00    Pack years: 0.00    Types: Cigarettes  . Smokeless tobacco: Never Used  Substance and Sexual Activity  . Alcohol use: Yes    Alcohol/week: 0.0 oz    Comment: very rarely per pt  . Drug use: No    Comment: former marijuana use, several years  . Sexual activity: Not Currently    Birth control/protection: Post-menopausal  Other Topics Concern  . Not on file  Social History Narrative  . Not on file    Review of Systems: A 12 point ROS discussed and pertinent positives are indicated in the HPI above.  All other systems are negative.  Review of Systems  Constitutional: Negative.   Respiratory: Negative.   Cardiovascular: Negative.   Gastrointestinal: Negative.   Genitourinary: Negative.   Musculoskeletal: Negative.   Neurological: Negative.      Vital Signs: BP (!) 167/76   Pulse 74   Temp 98.2 F (36.8 C) (  Oral)   Resp 17   Ht 5' 2.5" (1.588 m)   Wt 166 lb (75.3 kg)   SpO2 99%   BMI 29.88 kg/m   Physical Exam  Constitutional: She is oriented to person, place, and time. No distress.  Abdominal: Soft. She exhibits no distension. There is no tenderness. There is no rebound and no guarding.  Neurological: She is alert and oriented to person, place, and time.  Skin: She is not diaphoretic.  Vitals reviewed.   Imaging: Ct Abdomen W Wo Contrast  Result Date: 03/13/2017 CLINICAL DATA:  Right renal mass/angiomyolipoma, history of left renal cancer status post nephrectomy. EXAM: CT ABDOMEN WITHOUT AND WITH CONTRAST TECHNIQUE: Multidetector CT imaging of the abdomen was performed following the standard protocol before and following the bolus administration of intravenous contrast. CONTRAST:  158m ISOVUE-370 IOPAMIDOL (ISOVUE-370) INJECTION 76% COMPARISON:  06/08/2015 FINDINGS: Lower chest: Lung bases are essentially clear. Cardiomegaly. Small inferior pericardial effusion. Mitral valve annular calcifications. Coronary atherosclerosis of the right coronary artery. Hepatobiliary: Liver is within normal limits. Status post cholecystectomy. No intrahepatic or extrahepatic ductal dilatation. Pancreas: Within normal limits. Spleen: Within normal limits. Adrenals/Urinary Tract: Right adrenal glands within normal limits. Left adrenal gland is surgically absent. Status post left nephrectomy. Right renal cortical scarring/atrophy. No hydronephrosis. Multiple right renal lesions, including: --simple cysts, including a dominant 2.8 x 2.3 cm simple cyst in the lateral right upper kidney (series 5/image 51), benign (Bosniak I). --hemorrhagic cysts, including a 3.5 x 2.8 cm hemorrhagic cyst in the posterior right upper kidney (series 2/image 48) and a 4.4 x 3.7 cm hemorrhagic cyst in the lateral right lower kidney (series 2/image 57), benign (Bosniak  II). --complex cyst, including a 1.7 x 2.2 x 1.9 cm rim calcified lesion with progressive enhancement in the anterior right upper kidney (series 5/image 42), raising concern for cystic renal neoplasm (Bosniak III), previously 1.7 x 2.0 x 1.7 cm --suspected fat predominant angiomyolipoma, with small enhancing soft tissue component along the anterior right lower kidney (series 5/image 61), measuring at least 9.6 x 14.3 x 10.6 cm (incompletely visualized craniocaudally), previously 7.9 x 11.0 x 9.7 cm Stomach/Bowel: Stomach is within normal limits. Visualized bowel is grossly unremarkable, noting left colonic diverticulosis. Vascular/Lymphatic: No evidence of abdominal aortic aneurysm. Atherosclerotic calcifications of the abdominal aorta and branch vessels. No suspicious abdominal lymphadenopathy. Other: No abdominal ascites. Musculoskeletal: Degenerative changes of the visualized thoracolumbar spine. Renal osteodystrophy. IMPRESSION: 2.2 cm enhancing complex cystic lesion in the anterior right upper kidney, mildly increased from 2017, raising concern for cystic renal neoplasm (Bosniak III). 14.3 cm fat predominant angiomyolipoma along the right lower kidney, previously 11.0 cm, significantly increased. Additional right renal cysts, as described above, benign (Bosniak I-II). Status post radical left nephrectomy. No evidence of recurrent or metastatic disease. Electronically Signed   By: SJulian HyM.D.   On: 03/13/2017 09:05    Labs:  CBC: Recent Labs    11/21/16 0325 11/21/16 1412 11/22/16 0530 11/23/16 0443  WBC 2.9* 3.4* 3.9* 3.9*  HGB 10.4* 11.0* 10.6* 9.7*  HCT 33.1* 35.1* 33.9* 30.7*  PLT 147* 138* 151 139*    COAGS: Recent Labs    11/21/16 1825 11/22/16 0530 11/23/16 0137 11/23/16 0443  12/29/16 1602 01/10/17 01/17/17 03/01/17 1534  INR  --  1.70  --   --    < > 1.2 1.8* 1.6* 1.1  APTT 62* 59* 67* 72*  --   --   --   --   --    < > =  values in this interval not displayed.     BMP: Recent Labs    11/20/16 1653 11/20/16 1703 11/21/16 0325 11/21/16 1412 11/22/16 0530  NA 136 135 136  137 134* 134*  K 3.6 3.5 3.6  3.5 3.9 3.8  CL 95* 92* 95*  95* 95* 96*  CO2 27  --  28  32 28 28  GLUCOSE 90 94 80  84 105* 79  BUN _0 <5*  CALCIUM 9.3  --  9.0  9.1 9.1 9.2  CREATININE 6.73* 6.60* 7.57*  7.58* 8.29* 3.50*  GFRNONAA 5*  --  5*  5* 4* 12*  GFRAA 6*  --  6*  5* 5* 14*    LIVER FUNCTION TESTS: Recent Labs    07/06/16 0955 07/21/16 1530 09/13/16 1333 11/21/16 0325 11/21/16 1412  BILITOT 0.7 0.4  --   --   --   AST 19 16  --   --   --   ALT 12* 11*  --   --   --   ALKPHOS 96 88  --   --   --   PROT 7.2 6.1*  --   --   --   ALBUMIN 3.7 3.3* 2.9* 2.9* 2.9*    Assessment and Plan:  I met with Mrs. Riepe. We reviewed the follow-up CT of the abdomen performed with multiphase imaging after contrast earlier today. There is significant enlargement of the exophytic right renal angiomyolipoma emanating off of the lower pole of the right kidney and extending into the pelvis. This now measures approximately 14.3 cm in maximal diameter compared to 11 cm previously. An inferior branch of the right renal artery can be followed to a central nidus with small vessels coursing through the angiomyolipoma. A complex cystic lesion emanating off of the upper pole of the right kidney was felt to be potentially slightly larger. When reviewing prior imaging, my measurements were stable at approximately 2.2 cm maximal diameter and there is no convincing enlargement of any of the complex cystic lesions of the right kidney. There has been some increased dystrophic calcification along the walls of some of the lesions in the upper pole over time. A lower pole hemorrhagic cyst appears larger over time, but does not show evidence of enhancement.  We discussed the risk of spontaneous hemorrhage of an enlarging renal angiomyolipoma. Her risk of spontaneous  hemorrhage may be higher given chronic anticoagulation and hemodialysis. Given evidence of further enlargement of the AML, Mrs. Betterton is interested in pursuing transcatheter embolization to shrink the tumor. I discussed details again of transcatheter particle embolization of tumor blood supply which would be an angiographic procedure. Depending on degree of post embolic symptoms, she would potentially go home the same day or spend one night in the hospital for observation.  After discussing pros and cons of pursuing embolization soon versus continued surveillance of the enlarging AML, Mrs. Jurgens would like to proceed with angiography and embolization. She would like to wait until after her upcoming vascular surgical procedure with Dr. Bridgett Larsson. I agreed that we could certainly wait at least another month or two to allow her to recover after that procedure.  We will plan to schedule her for right renal arteriography with possible embolization sometime in April or May. She would like to have the [facility] at [facility].   Electronically SignedAletta Edouard T 03/13/2017, 11:40 AM     I spent a total of  25 Minutes in face  to face in clinical consultation, greater than 50% of which was counseling/coordinating care for management of a right renal angiomyolipoma.

## 2017-03-13 NOTE — Telephone Encounter (Signed)
I have left multiple messages to confirm time change to 6:30 am arrival to [facility] admitting on 03/22/17. (at both phone numbers given).

## 2017-03-14 DIAGNOSIS — N2581 Secondary hyperparathyroidism of renal origin: Secondary | ICD-10-CM | POA: Diagnosis not present

## 2017-03-14 DIAGNOSIS — I482 Chronic atrial fibrillation: Secondary | ICD-10-CM | POA: Diagnosis not present

## 2017-03-14 DIAGNOSIS — E119 Type 2 diabetes mellitus without complications: Secondary | ICD-10-CM | POA: Diagnosis not present

## 2017-03-14 DIAGNOSIS — D509 Iron deficiency anemia, unspecified: Secondary | ICD-10-CM | POA: Diagnosis not present

## 2017-03-14 DIAGNOSIS — N186 End stage renal disease: Secondary | ICD-10-CM | POA: Diagnosis not present

## 2017-03-14 DIAGNOSIS — D631 Anemia in chronic kidney disease: Secondary | ICD-10-CM | POA: Diagnosis not present

## 2017-03-15 ENCOUNTER — Other Ambulatory Visit: Payer: Self-pay | Admitting: Internal Medicine

## 2017-03-15 DIAGNOSIS — Z1231 Encounter for screening mammogram for malignant neoplasm of breast: Secondary | ICD-10-CM

## 2017-03-16 DIAGNOSIS — D631 Anemia in chronic kidney disease: Secondary | ICD-10-CM | POA: Diagnosis not present

## 2017-03-16 DIAGNOSIS — N2581 Secondary hyperparathyroidism of renal origin: Secondary | ICD-10-CM | POA: Diagnosis not present

## 2017-03-16 DIAGNOSIS — E119 Type 2 diabetes mellitus without complications: Secondary | ICD-10-CM | POA: Diagnosis not present

## 2017-03-16 DIAGNOSIS — D509 Iron deficiency anemia, unspecified: Secondary | ICD-10-CM | POA: Diagnosis not present

## 2017-03-16 DIAGNOSIS — N186 End stage renal disease: Secondary | ICD-10-CM | POA: Diagnosis not present

## 2017-03-19 DIAGNOSIS — E119 Type 2 diabetes mellitus without complications: Secondary | ICD-10-CM | POA: Diagnosis not present

## 2017-03-19 DIAGNOSIS — D509 Iron deficiency anemia, unspecified: Secondary | ICD-10-CM | POA: Diagnosis not present

## 2017-03-19 DIAGNOSIS — N2581 Secondary hyperparathyroidism of renal origin: Secondary | ICD-10-CM | POA: Diagnosis not present

## 2017-03-19 DIAGNOSIS — D631 Anemia in chronic kidney disease: Secondary | ICD-10-CM | POA: Diagnosis not present

## 2017-03-19 DIAGNOSIS — N186 End stage renal disease: Secondary | ICD-10-CM | POA: Diagnosis not present

## 2017-03-20 ENCOUNTER — Ambulatory Visit (INDEPENDENT_AMBULATORY_CARE_PROVIDER_SITE_OTHER): Payer: Medicare Other | Admitting: Pharmacist Clinician (PhC)/ Clinical Pharmacy Specialist

## 2017-03-20 DIAGNOSIS — I4891 Unspecified atrial fibrillation: Secondary | ICD-10-CM

## 2017-03-20 DIAGNOSIS — Z7901 Long term (current) use of anticoagulants: Secondary | ICD-10-CM

## 2017-03-20 LAB — POCT INR: INR: 1.4

## 2017-03-20 NOTE — Patient Instructions (Signed)
Description   Take extra 1/2 tablet today Tuesday March 20, then increase dose to 1 tablet daily.  Repeat INR in 2 weeks

## 2017-03-21 ENCOUNTER — Other Ambulatory Visit: Payer: Self-pay | Admitting: *Deleted

## 2017-03-21 ENCOUNTER — Telehealth: Payer: Self-pay | Admitting: *Deleted

## 2017-03-21 DIAGNOSIS — D509 Iron deficiency anemia, unspecified: Secondary | ICD-10-CM | POA: Diagnosis not present

## 2017-03-21 DIAGNOSIS — N186 End stage renal disease: Secondary | ICD-10-CM | POA: Diagnosis not present

## 2017-03-21 DIAGNOSIS — N2581 Secondary hyperparathyroidism of renal origin: Secondary | ICD-10-CM | POA: Diagnosis not present

## 2017-03-21 DIAGNOSIS — D631 Anemia in chronic kidney disease: Secondary | ICD-10-CM | POA: Diagnosis not present

## 2017-03-21 DIAGNOSIS — I482 Chronic atrial fibrillation: Secondary | ICD-10-CM | POA: Diagnosis not present

## 2017-03-21 DIAGNOSIS — E119 Type 2 diabetes mellitus without complications: Secondary | ICD-10-CM | POA: Diagnosis not present

## 2017-03-21 NOTE — Telephone Encounter (Signed)
Called patient and instructed to be at [facility] admitting department at 6:30 am for 03/22/17 procedure.

## 2017-03-22 ENCOUNTER — Ambulatory Visit (HOSPITAL_COMMUNITY)
Admission: RE | Admit: 2017-03-22 | Discharge: 2017-03-22 | Disposition: A | Discharge status: Home / Self Care | Payer: Medicare Other | Source: Ambulatory Visit | Attending: Vascular Surgery | Admitting: Vascular Surgery

## 2017-03-22 ENCOUNTER — Encounter (HOSPITAL_COMMUNITY): Payer: Self-pay | Admitting: Vascular Surgery

## 2017-03-22 ENCOUNTER — Encounter (HOSPITAL_COMMUNITY)
Admission: RE | Disposition: A | Discharge status: Home / Self Care | Payer: Self-pay | Source: Ambulatory Visit | Attending: Vascular Surgery

## 2017-03-22 DIAGNOSIS — Z8673 Personal history of transient ischemic attack (TIA), and cerebral infarction without residual deficits: Secondary | ICD-10-CM | POA: Insufficient documentation

## 2017-03-22 DIAGNOSIS — G4730 Sleep apnea, unspecified: Secondary | ICD-10-CM | POA: Diagnosis not present

## 2017-03-22 DIAGNOSIS — N186 End stage renal disease: Secondary | ICD-10-CM | POA: Diagnosis not present

## 2017-03-22 DIAGNOSIS — K219 Gastro-esophageal reflux disease without esophagitis: Secondary | ICD-10-CM | POA: Insufficient documentation

## 2017-03-22 DIAGNOSIS — Z955 Presence of coronary angioplasty implant and graft: Secondary | ICD-10-CM | POA: Insufficient documentation

## 2017-03-22 DIAGNOSIS — Z885 Allergy status to narcotic agent status: Secondary | ICD-10-CM | POA: Insufficient documentation

## 2017-03-22 DIAGNOSIS — I4891 Unspecified atrial fibrillation: Secondary | ICD-10-CM | POA: Insufficient documentation

## 2017-03-22 DIAGNOSIS — F17210 Nicotine dependence, cigarettes, uncomplicated: Secondary | ICD-10-CM | POA: Diagnosis not present

## 2017-03-22 DIAGNOSIS — Z7901 Long term (current) use of anticoagulants: Secondary | ICD-10-CM | POA: Insufficient documentation

## 2017-03-22 DIAGNOSIS — I272 Pulmonary hypertension, unspecified: Secondary | ICD-10-CM | POA: Diagnosis not present

## 2017-03-22 DIAGNOSIS — I5032 Chronic diastolic (congestive) heart failure: Secondary | ICD-10-CM | POA: Diagnosis not present

## 2017-03-22 DIAGNOSIS — Z91041 Radiographic dye allergy status: Secondary | ICD-10-CM | POA: Insufficient documentation

## 2017-03-22 DIAGNOSIS — Z992 Dependence on renal dialysis: Secondary | ICD-10-CM | POA: Diagnosis not present

## 2017-03-22 DIAGNOSIS — I871 Compression of vein: Secondary | ICD-10-CM | POA: Diagnosis not present

## 2017-03-22 DIAGNOSIS — I739 Peripheral vascular disease, unspecified: Secondary | ICD-10-CM | POA: Insufficient documentation

## 2017-03-22 DIAGNOSIS — N185 Chronic kidney disease, stage 5: Secondary | ICD-10-CM | POA: Diagnosis not present

## 2017-03-22 DIAGNOSIS — Z880 Allergy status to penicillin: Secondary | ICD-10-CM | POA: Insufficient documentation

## 2017-03-22 DIAGNOSIS — E039 Hypothyroidism, unspecified: Secondary | ICD-10-CM | POA: Diagnosis not present

## 2017-03-22 DIAGNOSIS — I132 Hypertensive heart and chronic kidney disease with heart failure and with stage 5 chronic kidney disease, or end stage renal disease: Secondary | ICD-10-CM | POA: Diagnosis not present

## 2017-03-22 DIAGNOSIS — I2510 Atherosclerotic heart disease of native coronary artery without angina pectoris: Secondary | ICD-10-CM | POA: Diagnosis not present

## 2017-03-22 DIAGNOSIS — Z8249 Family history of ischemic heart disease and other diseases of the circulatory system: Secondary | ICD-10-CM | POA: Diagnosis not present

## 2017-03-22 DIAGNOSIS — E785 Hyperlipidemia, unspecified: Secondary | ICD-10-CM | POA: Insufficient documentation

## 2017-03-22 HISTORY — PX: UPPER EXTREMITY VENOGRAPHY: CATH118272

## 2017-03-22 LAB — POCT I-STAT, CHEM 8
BUN: 31 mg/dL — ABNORMAL HIGH (ref 6–20)
CHLORIDE: 95 mmol/L — AB (ref 101–111)
Calcium, Ion: 1.01 mmol/L — ABNORMAL LOW (ref 1.15–1.40)
Creatinine, Ser: 5.5 mg/dL — ABNORMAL HIGH (ref 0.44–1.00)
Glucose, Bld: 74 mg/dL (ref 65–99)
HCT: 31 % — ABNORMAL LOW (ref 36.0–46.0)
Hemoglobin: 10.5 g/dL — ABNORMAL LOW (ref 12.0–15.0)
POTASSIUM: 4.6 mmol/L (ref 3.5–5.1)
SODIUM: 136 mmol/L (ref 135–145)
TCO2: 33 mmol/L — ABNORMAL HIGH (ref 22–32)

## 2017-03-22 LAB — PROTIME-INR
INR: 1.48
Prothrombin Time: 17.8 seconds — ABNORMAL HIGH (ref 11.4–15.2)

## 2017-03-22 SURGERY — UPPER EXTREMITY VENOGRAPHY
Anesthesia: LOCAL | Laterality: Bilateral

## 2017-03-22 MED ORDER — METHYLPREDNISOLONE SODIUM SUCC 125 MG IJ SOLR
125.0000 mg | INTRAMUSCULAR | Status: AC
  Administered 2017-03-22: 125 mg via INTRAVENOUS

## 2017-03-22 MED ORDER — IODIXANOL 320 MG/ML IV SOLN
INTRAVENOUS | Status: DC | PRN
  Administered 2017-03-22: 18 mL via INTRAVENOUS

## 2017-03-22 MED ORDER — FAMOTIDINE IN NACL 20-0.9 MG/50ML-% IV SOLN
INTRAVENOUS | Status: AC
  Administered 2017-03-22: 20 mg via INTRAVENOUS
  Filled 2017-03-22: qty 50

## 2017-03-22 MED ORDER — SODIUM CHLORIDE 0.9% FLUSH: 3.0000 mL | INTRAVENOUS | Status: DC | PRN

## 2017-03-22 MED ORDER — DIPHENHYDRAMINE HCL 50 MG/ML IJ SOLN
INTRAMUSCULAR | Status: AC
  Administered 2017-03-22: 25 mg via INTRAVENOUS
  Filled 2017-03-22: qty 1

## 2017-03-22 MED ORDER — SODIUM CHLORIDE 0.9% FLUSH: 3.0000 mL | Freq: Two times a day (BID) | INTRAVENOUS | Status: DC

## 2017-03-22 MED ORDER — SODIUM CHLORIDE 0.9 % IV SOLN: 250.0000 mL | INTRAVENOUS | Status: DC | PRN

## 2017-03-22 MED ORDER — HYDRALAZINE HCL 20 MG/ML IJ SOLN: 5.0000 mg | INTRAMUSCULAR | Status: DC | PRN

## 2017-03-22 MED ORDER — FAMOTIDINE IN NACL 20-0.9 MG/50ML-% IV SOLN
20.0000 mg | INTRAVENOUS | Status: AC
  Administered 2017-03-22: 20 mg via INTRAVENOUS

## 2017-03-22 MED ORDER — METHYLPREDNISOLONE SODIUM SUCC 125 MG IJ SOLR
INTRAMUSCULAR | Status: AC
  Administered 2017-03-22: 125 mg via INTRAVENOUS
  Filled 2017-03-22: qty 2

## 2017-03-22 MED ORDER — DIPHENHYDRAMINE HCL 50 MG/ML IJ SOLN
25.0000 mg | INTRAMUSCULAR | Status: AC
  Administered 2017-03-22: 25 mg via INTRAVENOUS

## 2017-03-22 MED ORDER — LABETALOL HCL 5 MG/ML IV SOLN: 10.0000 mg | INTRAVENOUS | Status: DC | PRN

## 2017-03-22 SURGICAL SUPPLY — 1 items: STOPCOCK MORSE 400PSI 3WAY (MISCELLANEOUS) ×2 IMPLANT

## 2017-03-22 NOTE — Discharge Instructions (Signed)
Venogram, Care After °This sheet gives you information about how to care for yourself after your procedure. Your health care provider may also give you more specific instructions. If you have problems or questions, contact your health care provider. °What can I expect after the procedure? °After the procedure, it is common to have: °· Bruising or mild discomfort in the area where the IV was inserted (insertion site). ° °Follow these instructions at home: °Eating and drinking °· Follow instructions from your health care provider about eating or drinking restrictions. °· Drink a lot of fluids for the first several days after the procedure, as directed by your health care provider. This helps to wash (flush) the contrast out of your body. Examples of healthy fluids include water or low-calorie drinks. °General instructions °· Check your IV insertion area every day for signs of infection. Check for: °? Redness, swelling, or pain. °? Fluid or blood. °? Warmth. °? Pus or a bad smell. °· Take over-the-counter and prescription medicines only as told by your health care provider. °· Rest and return to your normal activities as told by your health care provider. Ask your health care provider what activities are safe for you. °· Do not drive for 24 hours if you were given a medicine to help you relax (sedative), or until your health care provider approves. °· Keep all follow-up visits as told by your health care provider. This is important. °Contact a health care provider if: °· Your skin becomes itchy or you develop a rash or hives. °· You have a fever that does not get better with medicine. °· You feel nauseous. °· You vomit. °· You have redness, swelling, or pain around the insertion site. °· You have fluid or blood coming from the insertion site. °· Your insertion area feels warm to the touch. °· You have pus or a bad smell coming from the insertion site. °Get help right away if: °· You have difficulty breathing or  shortness of breath. °· You develop chest pain. °· You faint. °· You feel very dizzy. °These symptoms may represent a serious problem that is an emergency. Do not wait to see if the symptoms will go away. Get medical help right away. Call your local emergency services (911 in the [place] Do not drive yourself to the hospital. °Summary °· After your procedure, it is common to have bruising or mild discomfort in the area where the IV was inserted. °· You should check your IV insertion area every day for signs of infection. °· Take over-the-counter and prescription medicines only as told by your health care provider. °· You should drink a lot of fluids for the first several days after the procedure to help flush the contrast from your body. °This information is not intended to replace advice given to you by your health care provider. Make sure you discuss any questions you have with your health care provider. °Document Released: 10/09/2012 Document Revised: 11/13/2015 Document Reviewed: 11/13/2015 °Elsevier Interactive Patient Education © 2017 Elsevier Inc. ° °

## 2017-03-22 NOTE — Interval H&P Note (Signed)
History and Physical Interval Note:  03/22/2017 8:12 AM  [name]  has presented today for surgery, with the diagnosis of esrd  The various methods of treatment have been discussed with the patient and family. After consideration of risks, benefits and other options for treatment, the patient has consented to  Procedure(s): UPPER EXTREMITY VENOGRAPHY (Bilateral) as a surgical intervention .  The patient's history has been reviewed, patient examined, no change in status, stable for surgery.  I have reviewed the patient's chart and labs.  Questions were answered to the patient's satisfaction.     Adele Barthel

## 2017-03-22 NOTE — Op Note (Signed)
    OPERATIVE NOTE   PROCEDURE: 1.  bilateral arm and central venogram   PRE-OPERATIVE DIAGNOSIS: end stage renal disease  POST-OPERATIVE DIAGNOSIS: same as above   SURGEON: Adele Barthel, MD  ANESTHESIA: local  ESTIMATED BLOOD LOSS: 5 cc  FINDING(S): 1. Occluded right central venous structures with extensive collateralization 2. Patent Left high brachial vein extending to patent axillary and subclavian vein   3. Venous flow aborts in distal innominate vein but no contrast hang after repeat fluoroscopy 4. Repeat imaging was not done due to known contrast allergy  SPECIMEN(S):  None  CONTRAST: 18 cc  INDICATIONS: [name] is a 72 y.o. female who presents with end stage renal disease.  The patient is scheduled for bilateral arm and central venogram to help determine the availability of proximal veins for permanent access placement.  The patient is aware the risks include but are not limited to: bleeding, infection, thrombosis of the cannulated access, and possible anaphylactic reaction to the contrast.  The patient is aware of the risks of the procedure and elects to proceed forward.   DESCRIPTION: After full informed written consent was obtained, the patient was brought back to the angiography suite and placed supine upon the angiography table.  The patient was connected to monitoring equipment.  The right antecubital IV was connected to IV extension tubing.  Hand injections were completed to image the arm veins and central venous structures, the findings of which are listed above.    The left forearm IV was connected to IV extension tubing.  Hand injections were completed to image the arm veins and central venous structures, the findings of which are listed above.    Based on the images, reasonable to attempt redo left upper arm arteriovenous graft and placement of tunneled dialysis catheter.   COMPLICATIONS: none  CONDITION: stable   Adele Barthel, MD [facility] Office: [phone] Pager: [phone]  03/22/2017 9:37 AM

## 2017-03-23 DIAGNOSIS — D509 Iron deficiency anemia, unspecified: Secondary | ICD-10-CM | POA: Diagnosis not present

## 2017-03-23 DIAGNOSIS — E119 Type 2 diabetes mellitus without complications: Secondary | ICD-10-CM | POA: Diagnosis not present

## 2017-03-23 DIAGNOSIS — N186 End stage renal disease: Secondary | ICD-10-CM | POA: Diagnosis not present

## 2017-03-23 DIAGNOSIS — D631 Anemia in chronic kidney disease: Secondary | ICD-10-CM | POA: Diagnosis not present

## 2017-03-23 DIAGNOSIS — N2581 Secondary hyperparathyroidism of renal origin: Secondary | ICD-10-CM | POA: Diagnosis not present

## 2017-03-26 ENCOUNTER — Other Ambulatory Visit: Payer: Self-pay | Admitting: *Deleted

## 2017-03-26 ENCOUNTER — Telehealth: Payer: Self-pay | Admitting: Pharmacist Clinician (PhC)/ Clinical Pharmacy Specialist

## 2017-03-26 DIAGNOSIS — N186 End stage renal disease: Secondary | ICD-10-CM | POA: Diagnosis not present

## 2017-03-26 DIAGNOSIS — D631 Anemia in chronic kidney disease: Secondary | ICD-10-CM | POA: Diagnosis not present

## 2017-03-26 DIAGNOSIS — D509 Iron deficiency anemia, unspecified: Secondary | ICD-10-CM | POA: Diagnosis not present

## 2017-03-26 DIAGNOSIS — E119 Type 2 diabetes mellitus without complications: Secondary | ICD-10-CM | POA: Diagnosis not present

## 2017-03-26 DIAGNOSIS — N2581 Secondary hyperparathyroidism of renal origin: Secondary | ICD-10-CM | POA: Diagnosis not present

## 2017-03-26 NOTE — Telephone Encounter (Signed)
-----   Message from Willy Eddy, RN sent at 03/26/2017  2:10 PM EDT ----- Regarding: coumadin clearance Patient is scheduled for Left arm A/V Graft with Dr. Bridgett Larsson on 04/10/17. Will need to off Coumadin for 5 days pre-op. Requesting clearance and bridging if necessary. Please instruct patient Appreciate your Help. Thank you, Archivist

## 2017-03-26 NOTE — Telephone Encounter (Signed)
Patient with diagnosis of atrial fibrillation on warfarin for anticoagulation.    Procedure: left arm A/V graft Date of procedure: 04/10/17  CHADS2-VASc score of  7 (CHF, HTN, AGE, stroke/tia x 2, CAD, female)  CrCl 11 Platelet count 139  Per office protocol, patient can hold warfarin for 5 days prior to procedure.    Patient will need bridging with Lovenox (enoxaparin) around procedure.  Patient has next INR check on April 2.  We will give her bridging information at that time

## 2017-03-27 NOTE — Telephone Encounter (Signed)
   Primary Cardiologist: Minus Breeding, MD  Chart reviewed as part of pre-operative protocol coverage. Patient was contacted 03/27/2017 in reference to pre-operative risk assessment for pending surgery as outlined below.  [name] was last seen on 03/01/17 by Dr. Percival Spanish.  Since that day, [name] has done well. She has had no awareness of afib and no new symptoms.   Therefore, based on ACC/AHA guidelines, the patient would be at acceptable risk for the planned procedure without further cardiovascular testing.   See attached recommendations for anticoagulation. The patient is aware that she will be seen in the coumadin clinic on 4/2 with plans to stop coumadin and bridge with Lovenox.   I will route this recommendation to the requesting party via Epic fax function and remove from pre-op pool.  Please call with questions.  Daune Perch, NP 03/27/2017, 3:09 PM

## 2017-03-28 DIAGNOSIS — I482 Chronic atrial fibrillation: Secondary | ICD-10-CM | POA: Diagnosis not present

## 2017-03-28 DIAGNOSIS — E119 Type 2 diabetes mellitus without complications: Secondary | ICD-10-CM | POA: Diagnosis not present

## 2017-03-28 DIAGNOSIS — D631 Anemia in chronic kidney disease: Secondary | ICD-10-CM | POA: Diagnosis not present

## 2017-03-28 DIAGNOSIS — D509 Iron deficiency anemia, unspecified: Secondary | ICD-10-CM | POA: Diagnosis not present

## 2017-03-28 DIAGNOSIS — N186 End stage renal disease: Secondary | ICD-10-CM | POA: Diagnosis not present

## 2017-03-28 DIAGNOSIS — N2581 Secondary hyperparathyroidism of renal origin: Secondary | ICD-10-CM | POA: Diagnosis not present

## 2017-03-30 DIAGNOSIS — E119 Type 2 diabetes mellitus without complications: Secondary | ICD-10-CM | POA: Diagnosis not present

## 2017-03-30 DIAGNOSIS — N2581 Secondary hyperparathyroidism of renal origin: Secondary | ICD-10-CM | POA: Diagnosis not present

## 2017-03-30 DIAGNOSIS — N186 End stage renal disease: Secondary | ICD-10-CM | POA: Diagnosis not present

## 2017-03-30 DIAGNOSIS — D509 Iron deficiency anemia, unspecified: Secondary | ICD-10-CM | POA: Diagnosis not present

## 2017-03-30 DIAGNOSIS — D631 Anemia in chronic kidney disease: Secondary | ICD-10-CM | POA: Diagnosis not present

## 2017-04-02 DIAGNOSIS — D631 Anemia in chronic kidney disease: Secondary | ICD-10-CM | POA: Diagnosis not present

## 2017-04-02 DIAGNOSIS — B351 Tinea unguium: Secondary | ICD-10-CM | POA: Diagnosis not present

## 2017-04-02 DIAGNOSIS — I739 Peripheral vascular disease, unspecified: Secondary | ICD-10-CM | POA: Diagnosis not present

## 2017-04-02 DIAGNOSIS — D509 Iron deficiency anemia, unspecified: Secondary | ICD-10-CM | POA: Diagnosis not present

## 2017-04-02 DIAGNOSIS — N186 End stage renal disease: Secondary | ICD-10-CM | POA: Diagnosis not present

## 2017-04-02 DIAGNOSIS — Z992 Dependence on renal dialysis: Secondary | ICD-10-CM | POA: Diagnosis not present

## 2017-04-02 DIAGNOSIS — M19072 Primary osteoarthritis, left ankle and foot: Secondary | ICD-10-CM | POA: Diagnosis not present

## 2017-04-02 DIAGNOSIS — M79675 Pain in left toe(s): Secondary | ICD-10-CM | POA: Diagnosis not present

## 2017-04-02 DIAGNOSIS — N2581 Secondary hyperparathyroidism of renal origin: Secondary | ICD-10-CM | POA: Diagnosis not present

## 2017-04-02 DIAGNOSIS — L84 Corns and callosities: Secondary | ICD-10-CM | POA: Diagnosis not present

## 2017-04-02 DIAGNOSIS — E119 Type 2 diabetes mellitus without complications: Secondary | ICD-10-CM | POA: Diagnosis not present

## 2017-04-02 DIAGNOSIS — E1129 Type 2 diabetes mellitus with other diabetic kidney complication: Secondary | ICD-10-CM | POA: Diagnosis not present

## 2017-04-03 ENCOUNTER — Ambulatory Visit (INDEPENDENT_AMBULATORY_CARE_PROVIDER_SITE_OTHER): Payer: Medicare Other | Admitting: Pharmacist Clinician (PhC)/ Clinical Pharmacy Specialist

## 2017-04-03 ENCOUNTER — Other Ambulatory Visit: Payer: Self-pay | Admitting: Pharmacist Clinician (PhC)/ Clinical Pharmacy Specialist

## 2017-04-03 DIAGNOSIS — Z7901 Long term (current) use of anticoagulants: Secondary | ICD-10-CM

## 2017-04-03 DIAGNOSIS — I4891 Unspecified atrial fibrillation: Secondary | ICD-10-CM | POA: Diagnosis not present

## 2017-04-03 LAB — POCT INR: INR: 2.5

## 2017-04-03 MED ORDER — ENOXAPARIN SODIUM 80 MG/0.8ML ~~LOC~~ SOLN: 80.0000 mg | SUBCUTANEOUS | 0 refills | Status: DC

## 2017-04-03 NOTE — Patient Instructions (Signed)
Description   Follow bridging instructions.  Repeat INR on Monday April 15

## 2017-04-04 DIAGNOSIS — N2581 Secondary hyperparathyroidism of renal origin: Secondary | ICD-10-CM | POA: Diagnosis not present

## 2017-04-04 DIAGNOSIS — D631 Anemia in chronic kidney disease: Secondary | ICD-10-CM | POA: Diagnosis not present

## 2017-04-04 DIAGNOSIS — D509 Iron deficiency anemia, unspecified: Secondary | ICD-10-CM | POA: Diagnosis not present

## 2017-04-04 DIAGNOSIS — N186 End stage renal disease: Secondary | ICD-10-CM | POA: Diagnosis not present

## 2017-04-04 DIAGNOSIS — I482 Chronic atrial fibrillation: Secondary | ICD-10-CM | POA: Diagnosis not present

## 2017-04-04 DIAGNOSIS — E119 Type 2 diabetes mellitus without complications: Secondary | ICD-10-CM | POA: Diagnosis not present

## 2017-04-04 LAB — PROTIME-INR: INR: 2.4 — AB (ref 0.9–1.1)

## 2017-04-05 ENCOUNTER — Other Ambulatory Visit: Payer: Self-pay | Admitting: Cardiology

## 2017-04-06 ENCOUNTER — Other Ambulatory Visit: Payer: Self-pay

## 2017-04-06 ENCOUNTER — Encounter (HOSPITAL_COMMUNITY): Payer: Self-pay | Admitting: *Deleted

## 2017-04-06 DIAGNOSIS — D509 Iron deficiency anemia, unspecified: Secondary | ICD-10-CM | POA: Diagnosis not present

## 2017-04-06 DIAGNOSIS — E119 Type 2 diabetes mellitus without complications: Secondary | ICD-10-CM | POA: Diagnosis not present

## 2017-04-06 DIAGNOSIS — D631 Anemia in chronic kidney disease: Secondary | ICD-10-CM | POA: Diagnosis not present

## 2017-04-06 DIAGNOSIS — N186 End stage renal disease: Secondary | ICD-10-CM | POA: Diagnosis not present

## 2017-04-06 DIAGNOSIS — N2581 Secondary hyperparathyroidism of renal origin: Secondary | ICD-10-CM | POA: Diagnosis not present

## 2017-04-06 NOTE — Progress Notes (Signed)
Pt denies any acute cardiopulmonary issues. Pt under the care of Dr. Percival Spanish, Cardiology. Pt stated that her last dose of Coumadin was 04/04/17 and she is currently on a Lovenox bridge. Pt made aware to stop taking vitamins, fish oil and herbal medications. Do not take any NSAIDs ie: Ibuprofen, Advil, Naproxen (Aleve), Motrin, BC and Goody Powder. Pt verbalized understanding of all pre-op instructions. Anesthesia asked to review cardiac notes.

## 2017-04-08 DIAGNOSIS — M19072 Primary osteoarthritis, left ankle and foot: Secondary | ICD-10-CM | POA: Diagnosis not present

## 2017-04-08 DIAGNOSIS — L84 Corns and callosities: Secondary | ICD-10-CM | POA: Diagnosis not present

## 2017-04-08 DIAGNOSIS — B351 Tinea unguium: Secondary | ICD-10-CM | POA: Diagnosis not present

## 2017-04-08 DIAGNOSIS — Z992 Dependence on renal dialysis: Secondary | ICD-10-CM | POA: Diagnosis not present

## 2017-04-08 DIAGNOSIS — I739 Peripheral vascular disease, unspecified: Secondary | ICD-10-CM | POA: Diagnosis not present

## 2017-04-09 DIAGNOSIS — N2581 Secondary hyperparathyroidism of renal origin: Secondary | ICD-10-CM | POA: Diagnosis not present

## 2017-04-09 DIAGNOSIS — E119 Type 2 diabetes mellitus without complications: Secondary | ICD-10-CM | POA: Diagnosis not present

## 2017-04-09 DIAGNOSIS — D509 Iron deficiency anemia, unspecified: Secondary | ICD-10-CM | POA: Diagnosis not present

## 2017-04-09 DIAGNOSIS — N186 End stage renal disease: Secondary | ICD-10-CM | POA: Diagnosis not present

## 2017-04-09 DIAGNOSIS — D631 Anemia in chronic kidney disease: Secondary | ICD-10-CM | POA: Diagnosis not present

## 2017-04-09 NOTE — Progress Notes (Signed)
Anesthesia Chart Review:SAME DAY WORK-UP.  Patient is a 72 year old female scheduled for insertion of arteriovenous Gore-Tex graft, redo left upper arm on 04/10/17 by Dr. Adele Barthel.  History includes former smoker (quit 12/17/15), CAD/MI (s/p RCA stent in [place], [place] ~ '08; NSTEMI in setting of afib with RVR, medical therapy rec following 11/22/16), stress induced cardiomyopathy 02/22/11 (in setting of HCAP), pulmonary hypertension, diastolic CHF, afib 90/2409 (new onset; anticoag therapy initiated), HTN, clear cell renal cancer s/p left radical nephrectomy 08/31/09, enlarging right renal angiomyolipoma (anticipated embolization in IR ~ 05/2017), ESRD (on HD since '05; [facility] [place] [facility] [place] CVA '03, PAD (s/p bilateral iliac stents '02; s/p aborted left FPBG 07/11/16 due to "non-clampable" heavily calcified arteries, complicated by left thigh hematoma, s/p evacuation with placement of wound VAC 09/12/16), hypothyroidism, HLD, + PPD (s/p rifampin), GERD, seizure (>5 years ago), anemia, OSA, appendectomy, hysterectomy, cholecystectomy. For anesthesia complications, she reported confusion/AMS.    Meds include albuterol, Coreg, Pletal, Cardizem CD, Dialyvite, hydralazine, Isordil, Letairis (ambrisentan; for pulmonary hypertension), Keppra, levothyroxine, nitroglycerin, Prilosec OTC, pravastatin, Renvela, Sensipar, trazodone, warfarin (on Lovenox bridge). She has multiple allergies (see list), including Heparin.   - PCP is listed as Dr. Glendale Chard.  - Cardiologist is Dr. Minus Breeding, last visit 03/01/17. Per telephone encounter by Daune Perch, NP on 03/27/17, ".Marland KitchenMarland KitchenTherefore, based on ACC/AHA guidelines, the patient would be at acceptable risk for the planned procedure without further cardiovascular testing.  See attached recommendations for anticoagulation. The patient is aware that she will be seen in the coumadin clinic on 4/2 with plans to stop coumadin and bridge with Lovenox..."  -  She recently saw IR Dr. Aletta Edouard on 04/01/17 for an enlarging right renal angiomyolipoma. Her risk for spontaneous hemorrhage may be higher given chronic anticoagulation and hemodialysis. He discussed treatment options including embolization versus continued surveillance.  She would like to pursue embolization following vascular surgery with Dr. Bridgett Larsson.  He wrote "I agree that we could certainly wait at least another month or two to allow her to recover after that procedure."  After recovery plan if for her to undergo right renal arteriography with possible embolization.    - Pulmonologist is Dr. Simonne Maffucci, last 07/13/16 prior to [facility] (which was aborted). He wrote, "We know that her echocardiogram tends to overestimate her actual pulmonary pressures. She remains quite functional and reports no symptoms on very low doses of Letairis. So given her stability I see no reason to change her medications or consider a further workup prior to general anesthesia.Marland KitchenMarland KitchenKeep taking Letairis as your're doing. We will arrange for a 6 minute walk test. From my standpoint it is find for you to undergo general anesthesia."  EKG 11/22/16: NSR, right axis deviation, non-specific ST/T wave abnormality, prolonged QT (QT 424m/QTc 4933m.   LHC 11/22/16:  Severe diffuse calcific coronary artery disease.  Relatively focal distal left main of 40-50% stenosis.  Ostial circumflex 75-85%, forming a [place] 101 bifurcation stenosis with the LAD and left main.  75% distal circumflex.  Large, widely patent LAD.  Ostial RCA stent that overhangs into the ascending aorta.  Selective engagement was not possible.  Severe proximal to mid disease noted with images obtained.  LV is underfilled on hand injection, LVEDP is 25 mmHg, estimated overall EF is 50%.  Findings are compatible with chronic combined diastolic heart failure. RECOMMENDATIONS:  In comparing images with prior study from 2013, perhaps continued medical  therapy is best.  The right coronary cannot be treated percutaneously  due to ostial stent overhang.  Left main/LAD/circumflex disease could be stented but would be complex and require indefinite dual antiplatelet therapy.  Not sure that she is a surgical candidate given comorbidities and continued smoking.  Heart team approach.  Echo 11/23/16: Study Conclusions - Left ventricle: The cavity size was normal. Wall thickness was increased in a pattern of mild LVH. Systolic function was normal. The estimated ejection fraction was in the range of 55% to 60%. Wall motion was normal; there were no regional wall motion abnormalities. Doppler parameters are consistent with high ventricular filling pressure. - Aortic valve: There was mild stenosis. - Mitral valve: Severely calcified annulus. The findings are consistent with mild stenosis. There was mild regurgitation. Valve area by pressure half-time: 2.02 cm^2. - Left atrium: The atrium was severely dilated. - Right atrium: The atrium was mildly dilated. - Tricuspid valve: There was severe regurgitation. - Pulmonary arteries: Systolic pressure was severely increased. PA peak pressure: 80 mm Hg (S). - Pericardium, extracardiac: A small pericardial effusion was identified. Impressions: - Normal LV systolic function; mild LVH; calcified aortic valve with mild AS (mean gradient 12 mmHg); severe MAC with mild MS and mild MR; biatrial enlargment; moderate to severe TR with severely elevated pulmonary pressure; small pericardial effusion. (Comparison echo 05/13/13: EF 35-40%, moderate MR, severe TR, PA peak pressure 107 mmHg (S). 07/14/15: EF 45-50%, mild MR, severe TR, PA peak pressure 86 mmHg (S).)  Nuclear stress test 07/11/16:   The left ventricular ejection fraction is moderately decreased (30-44%).  Nuclear stress EF: 43%.  There was no ST segment deviation noted during stress.  Defect 1: There is a medium defect of severe severity present in the basal  inferior and apical inferior location.  This is a low risk study. Low risk stress nuclear study with inferior scar, mild peri-infarct ischemia and mildly reduced left ventricular global systolic function.  RHC 01/29/14(Indication: Severe pulmonary hypertension by echo measurement): Procedural Findings: Hemodynamics (mmHg) RA mean 1 RV 52/1 PA 52/20, mean 30 PCWP mean 6 Oxygen saturations: PA 70% AO 95% Cardiac Output (Fick) 5.77 Cardiac Index (Fick) 3.32 PVR 4.2 WU Cardiac Output (Thermo) 5.12 Cardiac Index (Thermo) 2.94 PVR 4.7 WU Final Conclusions: Normal right and left heart filling pressures. Normal cardiac output. Moderate pulmonary arterial hypertension. Treatment per Dr Lake Bells. We would be glad to see her in clinic as well as needed, will wait for direction from Dr Lake Bells.   Carotid U/S 01/11/15: Irregular calcific plaque noted in the bilateral ICAs and ECAs with velocities consistent with a 1-39% diameter reduction. Due to calcific plaque shadowing plaque may be greater than velocities suggest.   Bilateral arm and central venogram 03/22/17: 1. Occluded right central venous structures with extensive collateralization 2. Patent Left high brachial vein extending to patent axillary and subclavian vein   3. Venous flow aborts in distal innominate vein but no contrast hang after repeat fluoroscopy 4. Repeat imaging was not done due to known contrast allergy  CXR 01/05/17 (done during ED evaluation for COPD exacerbation): IMPRESSION: Cardiomegaly with bilateral from interstitial prominence again noted. Similar findings noted on prior exam. Findings again consistent with CHF.  PFTs 04/08/13: FVC 1.87 (81%), FEV1 1.44 (80%), DLCO unc 10.95 (47%).  She is a same day work-up, so labs will be done on arrival. Patient with known ESRD and pulmonary hypertension. Last seen by pulmonology 07/2017 with clearance for FPBG which was ultimately aborted. She had new onset  afib 11/2016, but did not require PCI following cath.  Cardiology has cleared for surgery and has given Lovenox bridging instructions. Anesthesiologist to evaluate on the day of surgery to ensure no acute changes and labs acceptable for OR.    George Hugh [facility] Short Stay Center/Anesthesiology Phone [phone] 04/09/2017 2:40 PM

## 2017-04-10 ENCOUNTER — Ambulatory Visit (HOSPITAL_COMMUNITY): Payer: Medicare Other

## 2017-04-10 ENCOUNTER — Ambulatory Visit (HOSPITAL_COMMUNITY)
Admission: RE | Admit: 2017-04-10 | Discharge: 2017-04-10 | Disposition: A | Discharge status: Home / Self Care | Payer: Medicare Other | Source: Ambulatory Visit | Attending: Vascular Surgery | Admitting: Vascular Surgery

## 2017-04-10 ENCOUNTER — Encounter (HOSPITAL_COMMUNITY): Payer: Self-pay

## 2017-04-10 ENCOUNTER — Encounter (HOSPITAL_COMMUNITY)
Admission: RE | Disposition: A | Discharge status: Home / Self Care | Payer: Self-pay | Source: Ambulatory Visit | Attending: Vascular Surgery

## 2017-04-10 ENCOUNTER — Ambulatory Visit (HOSPITAL_COMMUNITY): Payer: Medicare Other | Admitting: Emergency Medicine

## 2017-04-10 ENCOUNTER — Other Ambulatory Visit: Payer: Self-pay

## 2017-04-10 DIAGNOSIS — K219 Gastro-esophageal reflux disease without esophagitis: Secondary | ICD-10-CM | POA: Insufficient documentation

## 2017-04-10 DIAGNOSIS — Z905 Acquired absence of kidney: Secondary | ICD-10-CM | POA: Insufficient documentation

## 2017-04-10 DIAGNOSIS — I120 Hypertensive chronic kidney disease with stage 5 chronic kidney disease or end stage renal disease: Secondary | ICD-10-CM | POA: Diagnosis not present

## 2017-04-10 DIAGNOSIS — E039 Hypothyroidism, unspecified: Secondary | ICD-10-CM | POA: Insufficient documentation

## 2017-04-10 DIAGNOSIS — Z8673 Personal history of transient ischemic attack (TIA), and cerebral infarction without residual deficits: Secondary | ICD-10-CM | POA: Insufficient documentation

## 2017-04-10 DIAGNOSIS — Z888 Allergy status to other drugs, medicaments and biological substances status: Secondary | ICD-10-CM | POA: Diagnosis not present

## 2017-04-10 DIAGNOSIS — I132 Hypertensive heart and chronic kidney disease with heart failure and with stage 5 chronic kidney disease, or end stage renal disease: Secondary | ICD-10-CM | POA: Insufficient documentation

## 2017-04-10 DIAGNOSIS — Z880 Allergy status to penicillin: Secondary | ICD-10-CM | POA: Insufficient documentation

## 2017-04-10 DIAGNOSIS — E785 Hyperlipidemia, unspecified: Secondary | ICD-10-CM | POA: Insufficient documentation

## 2017-04-10 DIAGNOSIS — I5032 Chronic diastolic (congestive) heart failure: Secondary | ICD-10-CM | POA: Insufficient documentation

## 2017-04-10 DIAGNOSIS — Z4682 Encounter for fitting and adjustment of non-vascular catheter: Secondary | ICD-10-CM | POA: Diagnosis not present

## 2017-04-10 DIAGNOSIS — Z992 Dependence on renal dialysis: Secondary | ICD-10-CM

## 2017-04-10 DIAGNOSIS — Z419 Encounter for procedure for purposes other than remedying health state, unspecified: Secondary | ICD-10-CM

## 2017-04-10 DIAGNOSIS — Z85528 Personal history of other malignant neoplasm of kidney: Secondary | ICD-10-CM | POA: Diagnosis not present

## 2017-04-10 DIAGNOSIS — I4891 Unspecified atrial fibrillation: Secondary | ICD-10-CM | POA: Diagnosis not present

## 2017-04-10 DIAGNOSIS — N186 End stage renal disease: Secondary | ICD-10-CM | POA: Diagnosis not present

## 2017-04-10 DIAGNOSIS — Z8249 Family history of ischemic heart disease and other diseases of the circulatory system: Secondary | ICD-10-CM | POA: Insufficient documentation

## 2017-04-10 DIAGNOSIS — T82898A Other specified complication of vascular prosthetic devices, implants and grafts, initial encounter: Secondary | ICD-10-CM | POA: Insufficient documentation

## 2017-04-10 DIAGNOSIS — Z7902 Long term (current) use of antithrombotics/antiplatelets: Secondary | ICD-10-CM | POA: Insufficient documentation

## 2017-04-10 DIAGNOSIS — I2510 Atherosclerotic heart disease of native coronary artery without angina pectoris: Secondary | ICD-10-CM | POA: Insufficient documentation

## 2017-04-10 DIAGNOSIS — I252 Old myocardial infarction: Secondary | ICD-10-CM | POA: Insufficient documentation

## 2017-04-10 DIAGNOSIS — Z87891 Personal history of nicotine dependence: Secondary | ICD-10-CM | POA: Insufficient documentation

## 2017-04-10 DIAGNOSIS — I272 Pulmonary hypertension, unspecified: Secondary | ICD-10-CM | POA: Diagnosis not present

## 2017-04-10 DIAGNOSIS — Z885 Allergy status to narcotic agent status: Secondary | ICD-10-CM | POA: Diagnosis not present

## 2017-04-10 DIAGNOSIS — Z79899 Other long term (current) drug therapy: Secondary | ICD-10-CM | POA: Insufficient documentation

## 2017-04-10 DIAGNOSIS — Y832 Surgical operation with anastomosis, bypass or graft as the cause of abnormal reaction of the patient, or of later complication, without mention of misadventure at the time of the procedure: Secondary | ICD-10-CM | POA: Diagnosis not present

## 2017-04-10 DIAGNOSIS — N185 Chronic kidney disease, stage 5: Secondary | ICD-10-CM | POA: Diagnosis not present

## 2017-04-10 DIAGNOSIS — I700 Atherosclerosis of aorta: Secondary | ICD-10-CM | POA: Diagnosis not present

## 2017-04-10 DIAGNOSIS — Z7901 Long term (current) use of anticoagulants: Secondary | ICD-10-CM | POA: Diagnosis not present

## 2017-04-10 DIAGNOSIS — Z79890 Hormone replacement therapy (postmenopausal): Secondary | ICD-10-CM | POA: Diagnosis not present

## 2017-04-10 HISTORY — PX: INSERTION OF DIALYSIS CATHETER: SHX1324

## 2017-04-10 HISTORY — PX: AV FISTULA PLACEMENT: SHX1204

## 2017-04-10 HISTORY — DX: Unspecified asthma, uncomplicated: J45.909

## 2017-04-10 HISTORY — DX: Acute myocardial infarction, unspecified: I21.9

## 2017-04-10 HISTORY — DX: Presence of dental prosthetic device (complete) (partial): Z97.2

## 2017-04-10 LAB — POCT I-STAT 4, (NA,K, GLUC, HGB,HCT)
Glucose, Bld: 84 mg/dL (ref 65–99)
HEMATOCRIT: 27 % — AB (ref 36.0–46.0)
HEMOGLOBIN: 9.2 g/dL — AB (ref 12.0–15.0)
Potassium: 4.2 mmol/L (ref 3.5–5.1)
SODIUM: 135 mmol/L (ref 135–145)

## 2017-04-10 LAB — PROTIME-INR
INR: 1.39
Prothrombin Time: 16.9 seconds — ABNORMAL HIGH (ref 11.4–15.2)

## 2017-04-10 SURGERY — INSERTION OF ARTERIOVENOUS (AV) GORE-TEX GRAFT ARM
Anesthesia: General | Site: Groin | Laterality: Right

## 2017-04-10 MED ORDER — CHLORHEXIDINE GLUCONATE 4 % EX LIQD: 60.0000 mL | Freq: Once | CUTANEOUS | Status: DC

## 2017-04-10 MED ORDER — SODIUM CHLORIDE 0.9 % IV SOLN
INTRAVENOUS | Status: DC
  Administered 2017-04-10: 07:00:00 via INTRAVENOUS

## 2017-04-10 MED ORDER — PROPOFOL 10 MG/ML IV BOLUS
INTRAVENOUS | Status: AC
  Filled 2017-04-10: qty 20

## 2017-04-10 MED ORDER — LIDOCAINE HCL (PF) 1 % IJ SOLN
INTRAMUSCULAR | Status: AC
Start: 2017-04-10 — End: ?
  Filled 2017-04-10: qty 30

## 2017-04-10 MED ORDER — ONDANSETRON HCL 4 MG/2ML IJ SOLN
INTRAMUSCULAR | Status: DC | PRN
  Administered 2017-04-10: 4 mg via INTRAVENOUS

## 2017-04-10 MED ORDER — HYDROCODONE-ACETAMINOPHEN 5-325 MG PO TABS
ORAL_TABLET | ORAL | Status: AC
  Administered 2017-04-10: 1
  Filled 2017-04-10: qty 1

## 2017-04-10 MED ORDER — VASOPRESSIN 20 UNIT/ML IV SOLN
INTRAVENOUS | Status: AC
  Filled 2017-04-10: qty 1

## 2017-04-10 MED ORDER — DEXTROSE 5 % IV SOLN
INTRAVENOUS | Status: DC | PRN
  Administered 2017-04-10: 50 ug/min via INTRAVENOUS

## 2017-04-10 MED ORDER — PHENYLEPHRINE 40 MCG/ML (10ML) SYRINGE FOR IV PUSH (FOR BLOOD PRESSURE SUPPORT)
PREFILLED_SYRINGE | INTRAVENOUS | Status: DC | PRN
  Administered 2017-04-10: 160 ug via INTRAVENOUS
  Administered 2017-04-10 (×2): 120 ug via INTRAVENOUS

## 2017-04-10 MED ORDER — ONDANSETRON HCL 4 MG/2ML IJ SOLN
INTRAMUSCULAR | Status: AC
  Filled 2017-04-10: qty 2

## 2017-04-10 MED ORDER — HEMOSTATIC AGENTS (NO CHARGE) OPTIME
TOPICAL | Status: DC | PRN
  Administered 2017-04-10: 1 via TOPICAL

## 2017-04-10 MED ORDER — EPHEDRINE 5 MG/ML INJ
INTRAVENOUS | Status: AC
Start: 2017-04-10 — End: ?
  Filled 2017-04-10: qty 10

## 2017-04-10 MED ORDER — DEXAMETHASONE SODIUM PHOSPHATE 10 MG/ML IJ SOLN
INTRAMUSCULAR | Status: DC | PRN
  Administered 2017-04-10: 5 mg via INTRAVENOUS

## 2017-04-10 MED ORDER — ALBUMIN HUMAN 5 % IV SOLN
INTRAVENOUS | Status: AC
  Filled 2017-04-10: qty 250

## 2017-04-10 MED ORDER — PROPOFOL 10 MG/ML IV BOLUS
INTRAVENOUS | Status: DC | PRN
  Administered 2017-04-10: 150 mg via INTRAVENOUS

## 2017-04-10 MED ORDER — LIDOCAINE 2% (20 MG/ML) 5 ML SYRINGE
INTRAMUSCULAR | Status: AC
  Filled 2017-04-10: qty 5

## 2017-04-10 MED ORDER — FENTANYL CITRATE (PF) 250 MCG/5ML IJ SOLN
INTRAMUSCULAR | Status: AC
  Filled 2017-04-10: qty 5

## 2017-04-10 MED ORDER — DIPHENHYDRAMINE HCL 50 MG/ML IJ SOLN
INTRAMUSCULAR | Status: DC | PRN
  Administered 2017-04-10: 12.5 mg via INTRAVENOUS

## 2017-04-10 MED ORDER — EPHEDRINE SULFATE-NACL 50-0.9 MG/10ML-% IV SOSY
PREFILLED_SYRINGE | INTRAVENOUS | Status: DC | PRN
  Administered 2017-04-10 (×3): 10 mg via INTRAVENOUS

## 2017-04-10 MED ORDER — FENTANYL CITRATE (PF) 100 MCG/2ML IJ SOLN
INTRAMUSCULAR | Status: DC | PRN
  Administered 2017-04-10 (×2): 50 ug via INTRAVENOUS

## 2017-04-10 MED ORDER — VANCOMYCIN HCL IN DEXTROSE 1-5 GM/200ML-% IV SOLN
1000.0000 mg | INTRAVENOUS | Status: AC
  Administered 2017-04-10: 1000 mg via INTRAVENOUS

## 2017-04-10 MED ORDER — VANCOMYCIN HCL IN DEXTROSE 1-5 GM/200ML-% IV SOLN
INTRAVENOUS | Status: AC
  Filled 2017-04-10: qty 200

## 2017-04-10 MED ORDER — HYDROCODONE-ACETAMINOPHEN 5-325 MG PO TABS: 1.0000 | ORAL_TABLET | Freq: Four times a day (QID) | ORAL | 0 refills | Status: DC | PRN

## 2017-04-10 MED ORDER — DEXAMETHASONE SODIUM PHOSPHATE 10 MG/ML IJ SOLN
INTRAMUSCULAR | Status: AC
Start: 2017-04-10 — End: ?
  Filled 2017-04-10: qty 1

## 2017-04-10 MED ORDER — ALBUMIN HUMAN 5 % IV SOLN
12.5000 g | Freq: Once | INTRAVENOUS | Status: AC
  Administered 2017-04-10: 12.5 g via INTRAVENOUS

## 2017-04-10 MED ORDER — SODIUM CHLORIDE 0.9 % IV SOLN
INTRAVENOUS | Status: AC
  Filled 2017-04-10: qty 1.2

## 2017-04-10 MED ORDER — HYDROCODONE-ACETAMINOPHEN 5-325 MG PO TABS: 1.0000 | ORAL_TABLET | Freq: Once | ORAL | Status: DC

## 2017-04-10 MED ORDER — FENTANYL CITRATE (PF) 100 MCG/2ML IJ SOLN: 25.0000 ug | INTRAMUSCULAR | Status: DC | PRN

## 2017-04-10 MED ORDER — ONDANSETRON HCL 4 MG/2ML IJ SOLN: 4.0000 mg | Freq: Once | INTRAMUSCULAR | Status: DC | PRN

## 2017-04-10 MED ORDER — LIDOCAINE 2% (20 MG/ML) 5 ML SYRINGE
INTRAMUSCULAR | Status: DC | PRN
  Administered 2017-04-10: 60 mg via INTRAVENOUS

## 2017-04-10 MED ORDER — VASOPRESSIN 20 UNIT/ML IV SOLN
INTRAVENOUS | Status: DC | PRN
  Administered 2017-04-10 (×3): 1 [IU] via INTRAVENOUS
  Administered 2017-04-10: 2 [IU] via INTRAVENOUS

## 2017-04-10 MED ORDER — 0.9 % SODIUM CHLORIDE (POUR BTL) OPTIME
TOPICAL | Status: DC | PRN
  Administered 2017-04-10: 1000 mL

## 2017-04-10 MED ORDER — ANTICOAGULANT SODIUM CITRATE 4% (200MG/5ML) IV SOLN
5.0000 mL | Status: AC
  Administered 2017-04-10: 500 mL via INTRAVENOUS
  Filled 2017-04-10: qty 250

## 2017-04-10 MED ORDER — DIPHENHYDRAMINE HCL 50 MG/ML IJ SOLN
INTRAMUSCULAR | Status: AC
  Filled 2017-04-10: qty 1

## 2017-04-10 SURGICAL SUPPLY — 49 items
ARMBAND PINK RESTRICT EXTREMIT (MISCELLANEOUS) ×4 IMPLANT
BIOPATCH RED 1 DISK 7.0 (GAUZE/BANDAGES/DRESSINGS) ×3 IMPLANT
BIOPATCH RED 1IN DISK 7.0MM (GAUZE/BANDAGES/DRESSINGS) ×1
CANISTER SUCT 3000ML PPV (MISCELLANEOUS) ×4 IMPLANT
CATH EMB 3FR 40CM (CATHETERS) ×4 IMPLANT
CATH PALLINDROME 55CM (CATHETERS) ×4 IMPLANT
CHLORAPREP W/TINT 26ML (MISCELLANEOUS) ×4 IMPLANT
CLIP VESOCCLUDE MED 6/CT (CLIP) ×4 IMPLANT
CLIP VESOCCLUDE SM WIDE 6/CT (CLIP) ×4 IMPLANT
COVER PROBE W GEL 5X96 (DRAPES) ×8 IMPLANT
DECANTER SPIKE VIAL GLASS SM (MISCELLANEOUS) ×4 IMPLANT
DERMABOND ADVANCED (GAUZE/BANDAGES/DRESSINGS) ×4
DERMABOND ADVANCED .7 DNX12 (GAUZE/BANDAGES/DRESSINGS) ×4 IMPLANT
DRAPE C-ARM 42X72 X-RAY (DRAPES) ×4 IMPLANT
DRAPE CHEST BREAST 15X10 FENES (DRAPES) ×4 IMPLANT
ELECT REM PT RETURN 9FT ADLT (ELECTROSURGICAL) ×4
ELECTRODE REM PT RTRN 9FT ADLT (ELECTROSURGICAL) ×2 IMPLANT
GAUZE SPONGE 4X4 12PLY STRL LF (GAUZE/BANDAGES/DRESSINGS) ×4 IMPLANT
GLOVE BIO SURGEON STRL SZ7 (GLOVE) ×8 IMPLANT
GLOVE BIO SURGEON STRL SZ7.5 (GLOVE) ×4 IMPLANT
GLOVE BIOGEL PI IND STRL 6.5 (GLOVE) ×4 IMPLANT
GLOVE BIOGEL PI IND STRL 7.0 (GLOVE) ×4 IMPLANT
GLOVE BIOGEL PI IND STRL 7.5 (GLOVE) ×6 IMPLANT
GLOVE BIOGEL PI INDICATOR 6.5 (GLOVE) ×4
GLOVE BIOGEL PI INDICATOR 7.0 (GLOVE) ×4
GLOVE BIOGEL PI INDICATOR 7.5 (GLOVE) ×6
GLOVE SURG SS PI 6.5 STRL IVOR (GLOVE) ×8 IMPLANT
GOWN STRL REUS W/ TWL LRG LVL3 (GOWN DISPOSABLE) ×14 IMPLANT
GOWN STRL REUS W/TWL LRG LVL3 (GOWN DISPOSABLE) ×14
GRAFT GORETEX 6X40 (Vascular Products) ×4 IMPLANT
HEMOSTAT SPONGE AVITENE ULTRA (HEMOSTASIS) ×4 IMPLANT
INSERT FOGARTY SM (MISCELLANEOUS) ×4 IMPLANT
KIT BASIN OR (CUSTOM PROCEDURE TRAY) ×4 IMPLANT
KIT TURNOVER KIT B (KITS) ×4 IMPLANT
NS IRRIG 1000ML POUR BTL (IV SOLUTION) ×4 IMPLANT
PACK CV ACCESS (CUSTOM PROCEDURE TRAY) ×4 IMPLANT
PAD ARMBOARD 7.5X6 YLW CONV (MISCELLANEOUS) ×8 IMPLANT
SUT GORETEX 6.0 TT13 (SUTURE) ×8 IMPLANT
SUT MNCRL AB 4-0 PS2 18 (SUTURE) ×12 IMPLANT
SUT PROLENE 6 0 BV (SUTURE) ×8 IMPLANT
SUT PROLENE 7 0 BV 1 (SUTURE) IMPLANT
SUT SILK 2 0 PERMA HAND 18 BK (SUTURE) IMPLANT
SUT VIC AB 3-0 SH 27 (SUTURE) ×4
SUT VIC AB 3-0 SH 27X BRD (SUTURE) ×4 IMPLANT
SYR TOOMEY 50ML (SYRINGE) IMPLANT
TAPE CLOTH SURG 4X10 WHT LF (GAUZE/BANDAGES/DRESSINGS) ×4 IMPLANT
TOWEL GREEN STERILE (TOWEL DISPOSABLE) ×4 IMPLANT
UNDERPAD 30X30 (UNDERPADS AND DIAPERS) ×4 IMPLANT
WATER STERILE IRR 1000ML POUR (IV SOLUTION) ×4 IMPLANT

## 2017-04-10 NOTE — Anesthesia Procedure Notes (Addendum)
Procedure Name: LMA Insertion Date/Time: 04/10/2017 7:41 AM Performed by: Gwyndolyn Saxon, CRNA Pre-anesthesia Checklist: Patient identified, Emergency Drugs available, Suction available, Patient being monitored and Timeout performed Patient Re-evaluated:Patient Re-evaluated prior to induction Oxygen Delivery Method: Circle system utilized Preoxygenation: Pre-oxygenation with 100% oxygen Induction Type: IV induction Ventilation: Mask ventilation without difficulty LMA: LMA inserted LMA Size: 4.0 Number of attempts: 1 Placement Confirmation: positive ETCO2,  CO2 detector and breath sounds checked- equal and bilateral Tube secured with: Tape Dental Injury: Teeth and Oropharynx as per pre-operative assessment

## 2017-04-10 NOTE — Transfer of Care (Signed)
Immediate Anesthesia Transfer of Care Note  Patient: Tyffani Foglesong Hecker  Procedure(s) Performed: INSERTION OF ARTERIOVENOUS (AV) GORE-TEX GRAFT REDO LEFT UPPER ARM (Left Arm Upper) INSERTION OF DIALYSIS CATHETER (Right Groin)  Patient Location: PACU  Anesthesia Type:General  Level of Consciousness: awake and patient cooperative  Airway & Oxygen Therapy: Patient Spontanous Breathing and Patient connected to face mask oxygen  Post-op Assessment: Report given to RN and Post -op Vital signs reviewed and stable  Post vital signs: Reviewed and stable  Last Vitals:  Vitals Value Taken Time  BP 105/56 04/10/2017  9:56 AM  Temp    Pulse 74 04/10/2017 10:00 AM  Resp 22 04/10/2017 10:00 AM  SpO2 94 % 04/10/2017 10:00 AM  Vitals shown include unvalidated device data.  Last Pain:  Vitals:   04/10/17 0649  TempSrc:   PainSc: 4          Complications: No apparent anesthesia complications

## 2017-04-10 NOTE — Anesthesia Postprocedure Evaluation (Signed)
Anesthesia Post Note  Patient: [name]  Procedure(s) Performed: INSERTION OF ARTERIOVENOUS (AV) GORE-TEX GRAFT REDO LEFT UPPER ARM (Left Arm Upper) INSERTION OF DIALYSIS CATHETER (Right Groin)     Patient location during evaluation: PACU Anesthesia Type: General Level of consciousness: awake and alert Pain management: pain level controlled Vital Signs Assessment: post-procedure vital signs reviewed and stable Respiratory status: spontaneous breathing, nonlabored ventilation, respiratory function stable and patient connected to nasal cannula oxygen Cardiovascular status: blood pressure returned to baseline and stable Postop Assessment: no apparent nausea or vomiting Anesthetic complications: no    Last Vitals:  Vitals:   04/10/17 1200 04/10/17 1230  BP: (!) 102/50 (!) 105/51  Pulse:  68  Resp:    Temp:    SpO2: 94% 95%    Last Pain:  Vitals:   04/10/17 1100  TempSrc:   PainSc: Asleep                 Rakisha Pincock COKER

## 2017-04-10 NOTE — Progress Notes (Signed)
SBP 81-86, SR 60's, pt sleepy, easily arousable, says she took her BP meds this morning. Dr Linna Caprice notified & will be over to see pt. Also, put on 2L [place] to maintain O2 sats > 90%. Will continue to monitor closely.

## 2017-04-10 NOTE — Discharge Instructions (Signed)
° °  [facility] [place] ° °Discharge Instructions ° °AV Fistula or Graft Surgery for Dialysis Access ° °Please refer to the following instructions for your post-procedure care. Your surgeon or physician assistant will discuss any changes with you. ° °Activity ° °You may drive the day following your surgery, if you are comfortable and no longer taking prescription pain medication. Resume full activity as the soreness in your incision resolves. ° °Bathing/Showering ° °You may shower after you go home. Keep your incision dry for 48 hours. Do not soak in a bathtub, hot tub, or swim until the incision heals completely. You may not shower if you have a hemodialysis catheter. ° °Incision Care ° °Clean your incision with mild soap and water after 48 hours. Pat the area dry with a clean towel. You do not need a bandage unless otherwise instructed. Do not apply any ointments or creams to your incision. You may have skin glue on your incision. Do not peel it off. It will come off on its own in about one week. Your arm may swell a bit after surgery. To reduce swelling use pillows to elevate your arm so it is above your heart. Your doctor will tell you if you need to lightly wrap your arm with an ACE bandage. ° °Diet ° °Resume your normal diet. There are not special food restrictions following this procedure. In order to heal from your surgery, it is CRITICAL to get adequate nutrition. Your body requires vitamins, minerals, and protein. Vegetables are the best source of vitamins and minerals. Vegetables also provide the perfect balance of protein. Processed food has little nutritional value, so try to avoid this. ° °Medications ° °Resume taking all of your medications. If your incision is causing pain, you may take over-the counter pain relievers such as acetaminophen (Tylenol). If you were prescribed a stronger pain medication, please be aware these medications can cause nausea and constipation. Prevent  nausea by taking the medication with a snack or meal. Avoid constipation by drinking plenty of fluids and eating foods with high amount of fiber, such as fruits, vegetables, and grains. Do not take Tylenol if you are taking prescription pain medications. ° ° ° ° °Follow up °Your surgeon may want to see you in the office following your access surgery. If so, this will be arranged at the time of your surgery. ° °Please call us immediately for any of the following conditions: ° °Increased pain, redness, drainage (pus) from your incision site °Fever of 101 degrees or higher °Severe or worsening pain at your incision site °Hand pain or numbness. ° °Reduce your risk of vascular disease: ° °Stop smoking. If you would like help, call [facility] at 1-800-QUIT-NOW (1-800-784-8669) or [place] at 336-586-4000 ° °Manage your cholesterol °Maintain a desired weight °Control your diabetes °Keep your blood pressure down ° °Dialysis ° °It will take several weeks to several months for your new dialysis access to be ready for use. Your surgeon will determine when it is OK to use it. Your nephrologist will continue to direct your dialysis. You can continue to use your Permcath until your new access is ready for use. ° °If you have any questions, please call the office at [phone]. ° °

## 2017-04-10 NOTE — Progress Notes (Signed)
SBP in the 90's, last one 98/50. Pt's only c/o is of chronic LLE pain which she has been experiencing. Dr Linna Caprice updated-OK to dc pt.

## 2017-04-10 NOTE — Progress Notes (Signed)
Report given to maryann shaver rn as caregiver 

## 2017-04-10 NOTE — Op Note (Signed)
OPERATIVE NOTE  PROCEDURE: 1. right femoral vein tunneled dialysis catheter placement 2. right femoral vein cannulation under ultrasound guidance 3. Placement of new left upper arm arteriovenous graft   PRE-OPERATIVE DIAGNOSIS: hemodialysis dependence, calcification and degeneration of left upper arm arteriovenous graft   POST-OPERATIVE DIAGNOSIS: same as above  SURGEON: Adele Barthel, MD  ANESTHESIA: general  ESTIMATED BLOOD LOSS: 30 cc  FINDING(S): 1.  Tips of the catheter in the right atrium on fluoroscopy  SPECIMEN(S):  none  INDICATIONS:   [name] is a 72 y.o. female who presents with calcification and degeneration of left upper arm arteriovenous graft.  The patient presents for revision vs replacement of left upper arm arteriovenous graft and tunneled dialysis catheter placement.    The patient is aware the risks of tunneled dialysis catheter placement include but are not limited to: bleeding, infection, central venous injury, pneumothorax, possible venous stenosis, possible malpositioning in the venous system, and possible infections related to long-term catheter presence.    Risk, benefits, and alternatives to access surgery were discussed.  The patient is aware the risks include but are not limited to: bleeding, infection, steal syndrome, nerve damage, ischemic monomelic neuropathy, thrombosis, failure to mature, complications related to venous hypertension, need for additional procedures, death and stroke.  The patient agrees to proceed forward with the procedure.  The patient was aware of these risks and agreed to proceed.   DESCRIPTION: After written full informed consent was obtained from the patient, the patient was taken back to the operating room.  Prior to induction, the patient was given IV antibiotics.  After obtaining adequate sedation, the patient was prepped and draped in the standard fashion for a femoral vein tunneled dialysis catheter placement.      Under ultrasound guidance, the right femoral vein was cannulated with a 18 gauge needle.  A J-wire was then placed into the right atrium under fluroscopic guidance.  The wire was then secured in place with a clamp to the drapes.    I then made stab incisions at the cannulation and exit sites.  I dissected from the exit site to the cannulation site with a metal dissector.  The wire was then unclamped and I removed the needle.  The skin tract and venotomy was dilated serially with graduated dilators.  Finally, the dilator-sheath was placed under fluroscopic guidance into the iliac vein.  The dilator and wire were removed.    A 55 cm Palindrome catheter was placed under fluoroscopic guidance into the right atrium.  The sheath was broken and peeled away while holding the catheter cuff at the level of the skin.  The catheter was clamped with the plastic clamp.  The back of the catheter was transected revealing the two lumen.  The dissector was docked onto one of these lumens.  The dissector with the back end of this catheter was pulled through the subcutaneous tunnel.  The catheter was again clamped and the back end of the catheter was transected again to reveal the two lumens again.  The catheter collar was placed over the catheter and then two ports loaded onto the two lumens.  The catheter collar was snapped into place, securing the two ports.  The catheter was pulled into appropriate position under fluoroscopy.  Each port was tested by aspirating and flushing.  No resistance was noted.  Each port was then thoroughly flushed with citrate solution due to heparin allergy.  The catheter was secured in placed with two interrupted stitches  of 3-0 Nylon tied to the catheter.  The cannulation incision was closed with a U-stitch of 4-0 Monocryl.  The cannulation and exit incisions were cleaned and sterile bandages applied.  Each port was then loaded with citrate solution at the manufacturer recommended volumes to  each port.  Sterile caps were applied to each port.    On completion fluoroscopy, the tips of the catheter were in the right atrium, and there was no evidence of pneumothorax.  At this point, the drapes were taken down and the patient repositioned for a left arm access procedure.  The patient was reprepped and redraped.  I made incision over the arterial anastomosis through the prior incision.  I dissected down to a heavily calcified arteriovenous graft with electrocautery and dissected out a 4 cm segment of the arteriovenous graft.  Due to the prior DRIL completed at an outside facility, I elected to not redo the arterial anastomosis.  I placed a vessel loop around the arteriovenous graft.  I then made an incision over the venous anastomosis through the prior incision.  I dissected down to the arteriovenous graft with electrocautery.  I dissected out a 5 cm segment of the arteriovenous graft going down to the axillary vein.  I placed vessel loops around the venous anastomosis of the arteriovenous graft.    I then obtained a curvilinear tunneler and dissected lateral to the prior arteriovenous graft in the subcutaneous tissue.  I placed a 6 mm Goretex graft through the metal tunnel and removed the tunnel, leaving the graft in place.  I turned my attention to the arterial anastomosis.  I clamped the arteriovenous graft distally to crush some of the calcified arteriovenous graft.  I then clamped the arteriovenous graft distally and then transected a 3 cm segment of the arteriovenous graft.  I sewed the new to old arteriovenous graft with a running stitch of CV-6 in an end-to-end configuration.  I pulled the arteriovenous graft to tension in the tunneled.  I bled the arteriovenous graft and there was semi-pulsatile bleeding.  I clamped the arteriovenous graft proximal to the new arterial anastomosis.  I packed this incision with Avitene.  I turned my attention to the venous exposure.  I clamped the axillary  vein.  I transected the proximal extent of the old graft.  I cut out 4 cm of the old arteriovenous graft to facilitate the new anastomosis.  There was a small rim of graft on the vein which I trimmed back.  There was minimal residual neointimal hyperplasia.  I elected to not fully dissect out the axillary vein due to heavy calcification and desire to avoid nerve injury.  I sewed the proximal end of the new arteriovenous graft to the axillary vein in an end-to-end configuration with a CV-6 stitch.  Prior to completion, I bled the venous end: vigorous backbleeding.   I bled the arteriovenous graft: pulsatile bleeding with minimal thrombus on one pass.  I completed the anastomosis in the standard fashion.    I released all clamps and immediately there was a palpable faint thrill in the vein.  I confirmed this with a continuous doppler.  I packed this venous exposure with Avitene.  I washed out the arterial exposure and repacked the incision with Avitene.  After waiting a few minutes, there was no further bleeding.  I repaired this exposure with a running stitch of 3-0 Vicryl and running subcuticular stitch of 4-0 Monocryl.  The skin was cleaned, dried, and reinforced with Dermabond.  I then turned my attention to the left axilla.  I washed out the venous anastomosis.  There was no further bleeding.   I repaired this exposure with a double layer of 3-0 Vicryl and a running subcuticular stitch of 4-0 Monocryl.  The skin was cleaned, dried, and reinforced with Dermabond.    COMPLICATIONS: none  CONDITION: stable   Adele Barthel, MD, [facility] [place] Office: [phone] Pager: [phone]  04/10/2017, 8:04 AM

## 2017-04-10 NOTE — H&P (Signed)
Brief History and Physical  History of Present Illness   [name] is a 72 y.o. female who presents with chief complaint: difficulty with cannulation of calcified LUA AVG.  The patient presents today for Revision vs replacement of LUA AVG, placement of tunneled dialysis catheter.    Past Medical History:  Diagnosis Date  . Anemia    History  of Blood transfusion  . Asthma    PMH  . Atrial fibrillation with RVR ([place]) 11/2016  . CAD (coronary artery disease)    stent to RCA  . Cancer ([place])    clear cell cancer, kidney  . Chronic renal insufficiency    On hemodialysis  . Complication of anesthesia 12/2010   pt is very confused, with AMS with anesthesia  . CVA 07/27/2008   CVA affected cognition and memory per family, no focal deficits.    . CVA (cerebral infarction) 2003   no apparent residual  . Diastolic congestive heart failure ([place])   . Dyslipidemia   . Encephalopathy   . ESRD 07/27/2008   ESRD due to HTN and NSAID's, started hemodialysis in 2005 in [place], [place]. Went to [facility] from 2010 to 2012 and since 2012 has been getting dialysis at [facility] on [address] in [place] on a MWF schedule. First access with RUA AVG placed in [place]. Next and current access was LUA AVG placed by Dr. Lucky Cowboy in [place] in or around 2012. Has had 2 or 3 procedures on that graft since placed per family. She gets her access work done here in [place] now. She is allergic to heparin and does not get any heparin at dialysis; she had an allergic reaction apparently when in ICU in the past.      . GERD (gastroesophageal reflux disease)   . Hyperlipidemia   . Hypertension   . Hypothyroidism   . Myocardial infarction ([place])    per pt  . Positive PPD    completed rifampin  . Pulmonary hypertension ([place])   . PVD (peripheral vascular disease) ([place])   . Seizures ([place])    last seizure 6 years ago  . Sleep apnea    Sleep Study 2008  . Traumatic seroma of left lower leg  ([place])   . Wears glasses   . Wears partial dentures     Past Surgical History:  Procedure Laterality Date  . ABDOMINAL AORTOGRAM W/LOWER EXTREMITY Bilateral 06/15/2016   Procedure: Abdominal Aortogram w/Lower Extremity;  Surgeon: Conrad [place], MD;  Location: [facility];  Service: Cardiovascular;  Laterality: Bilateral;  . ABDOMINAL HYSTERECTOMY    . APPENDECTOMY    . APPLICATION OF WOUND VAC Left 09/12/2016   Procedure: APPLICATION OF WOUND VAC;  Surgeon: Conrad Holcomb, MD;  Location: [facility];  Service: Vascular;  Laterality: Left;  . ARTERY EXPLORATION Left 07/25/2016   Procedure: ARTERY EXPLORATION LEFT ABOVE KNEE POPLITEAL AND GROIN;  Surgeon: Conrad Mayview, MD;  Location: [facility];  Service: Vascular;  Laterality: Left;  . CARDIAC CATHETERIZATION     last 2016  . CHOLECYSTECTOMY    . COLONOSCOPY    . D&Cs    . HEMATOMA EVACUATION Left 09/12/2016   Procedure: EVACUATION HEMATOMA;  Surgeon: Conrad [place], MD;  Location: [facility];  Service: Vascular;  Laterality: Left;  . IR RADIOLOGIST EVAL & MGMT  03/13/2017  . LEFT HEART CATH AND CORONARY ANGIOGRAPHY N/A 11/22/2016   Procedure: LEFT HEART CATH AND CORONARY ANGIOGRAPHY;  Surgeon: Daneen Schick  W, MD;  Location: [facility];  Service: Cardiovascular;  Laterality: N/A;  . LEFT HEART CATHETERIZATION WITH CORONARY ANGIOGRAM N/A 02/22/2011   Procedure: LEFT HEART CATHETERIZATION WITH CORONARY ANGIOGRAM;  Surgeon: Wellington Hampshire, MD;  Location: [facility];  Service: Cardiovascular;  Laterality: N/A;  . left nephrectomy    . MULTIPLE TOOTH EXTRACTIONS    . NEPHRECTOMY Left    Malignant tumor  . PERIPHERAL VASCULAR CATHETERIZATION N/A 12/31/2014   Procedure: Abdominal Aortogram;  Surgeon: Conrad Firth, MD;  Location: [facility];  Service: Cardiovascular;  Laterality: N/A;  . right ankle repair    . RIGHT HEART CATHETERIZATION N/A 01/29/2014   Procedure: RIGHT HEART CATH;  Surgeon: Larey Dresser, MD;  Location: [facility];   Service: Cardiovascular;  Laterality: N/A;  . TONSILLECTOMY    . TUBAL LIGATION    . ULTRASOUND GUIDANCE FOR VASCULAR ACCESS  11/22/2016   Procedure: Ultrasound Guidance For Vascular Access;  Surgeon: Belva Crome, MD;  Location: [facility];  Service: Cardiovascular;;  . UPPER EXTREMITY VENOGRAPHY Bilateral 03/22/2017   Procedure: UPPER EXTREMITY VENOGRAPHY;  Surgeon: Conrad [place], MD;  Location: [facility];  Service: Cardiovascular;  Laterality: Bilateral;  bilateral  . VASCULAR SURGERY  11/2010   graft inserted to left arm    Social History   Socioeconomic History  . Marital status: Divorced    Spouse name: Not on file  . Number of children: 2  . Years of education: 60  . Highest education level: Not on file  Occupational History  . Occupation: Retired Optician, dispensing: RETIRED  Social Needs  . Financial resource strain: Not on file  . Food insecurity:    Worry: Not on file    Inability: Not on file  . Transportation needs:    Medical: Not on file    Non-medical: Not on file  Tobacco Use  . Smoking status: Former Smoker    Packs/day: 0.00    Years: 53.00    Pack years: 0.00    Types: Cigarettes  . Smokeless tobacco: Never Used  . Tobacco comment: quit smoking cigarettes in January 2019  Substance and Sexual Activity  . Alcohol use: Yes    Alcohol/week: 0.0 oz    Comment: very rarely per pt  . Drug use: No    Comment: former marijuana use, several years  . Sexual activity: Not Currently    Birth control/protection: Post-menopausal  Lifestyle  . Physical activity:    Days per week: Not on file    Minutes per session: Not on file  . Stress: Not on file  Relationships  . Social connections:    Talks on phone: Not on file    Gets together: Not on file    Attends religious service: Not on file    Active member of club or organization: Not on file    Attends meetings of clubs or organizations: Not on file    Relationship status: Not on file  .  Intimate partner violence:    Fear of current or ex partner: Not on file    Emotionally abused: Not on file    Physically abused: Not on file    Forced sexual activity: Not on file  Other Topics Concern  . Not on file  Social History Narrative  . Not on file    Family History  Problem Relation Age of Onset  . Heart disease Father   .  Hypertension Mother   . Dementia Mother   . Coronary artery disease Sister   . Heart attack Sister   . Hypertension Brother     Current Facility-Administered Medications  Medication Dose Route Frequency Provider Last Rate Last Dose  . 0.9 %  sodium chloride infusion   Intravenous Continuous Conrad [place], MD      . chlorhexidine (HIBICLENS) 4 % liquid 4 application  60 mL Topical Once Conrad [place], MD       And  . Derrill Memo ON 04/11/2017] chlorhexidine (HIBICLENS) 4 % liquid 4 application  60 mL Topical Once Conrad Duncan, MD      . vancomycin (VANCOCIN) 1-5 GM/200ML-% IVPB           . vancomycin (VANCOCIN) IVPB 1000 mg/200 mL premix  1,000 mg Intravenous 60 min Pre-Op Conrad Belcourt, MD       Facility-Administered Medications Ordered in Other Encounters  Medication Dose Route Frequency Provider Last Rate Last Dose  . diphenhydrAMINE (BENADRYL) capsule 50 mg  50 mg Oral Once Aletta Edouard, MD        Allergies  Allergen Reactions  . Ace Inhibitors Anaphylaxis and Rash  . Heparin Other (See Comments)    MDs told her not to take after reaction in ICU  . Iohexol Swelling and Other (See Comments)    1970s; passed out and had facial/tongue swelling.  Requires 13-hour prep with prednisone and benadryl  . Phenytoin Other (See Comments)    Had reaction while in ICU; doesn't know.  MDs told her not to take ever again.  . Wellbutrin [Bupropion] Other (See Comments)    seizures  . Meperidine Hcl Swelling and Rash    Makes tongue swell  . Morphine Rash  . Penicillins Hives and Rash    Has patient had a PCN reaction causing immediate rash,  facial/tongue/throat swelling, SOB or lightheadedness with hypotension: Yes Has patient had a PCN reaction causing severe rash involving mucus membranes or skin necrosis: No Has patient had a PCN reaction that required hospitalization: No Has patient had a PCN reaction occurring within the last 10 years: No If all of the above answers are "NO", then may proceed with Cephalosporin use.    . Valproic Acid And Related Other (See Comments)    Confusion   . Iodinated Diagnostic Agents Other (See Comments)    Pre-meditate with benadryl and prednisone 3 times before appt.  . Pentazocine Lactate Other (See Comments)    Patient does not remember reaction to this med ([place]).     Review of Systems: As listed above, otherwise negative.   Physical Examination   Vitals:   04/10/17 0611 04/10/17 0619 04/10/17 0649  BP:  (!) 114/39   Pulse: 68    Resp: 18    Temp: 97.6 F (36.4 C)    TempSrc: Oral    SpO2: 100%    Weight: 166 lb (75.3 kg)    Height:   5' 2.5" (1.588 m)   Body mass index is 29.88 kg/m.  General Alert, O x 3, WD, NAD  Pulmonary Sym exp, good B air movt, CTA B  Cardiac RRR, Nl S1, S2, no Murmurs, No rubs, No S3,S4  Musculo- skeletal LUA AVG with pseudoaneurysm segments, +bruit, +thrill  Neurologic Pain and light touch intact in extremities ,     Laboratory  See iStat   Medical Decision Making   MARYFRANCES PORTUGAL is a 72 y.o. female who presents with: calcified degenerated LUA  AVG.   The patient is scheduled for: revision vs replacement of LUA AVG, placement of tunneled dialysis catheter   Risk, benefits, and alternatives to access surgery were discussed.  The patient is aware the risks include but are not limited to: bleeding, infection, steal syndrome, nerve damage, ischemic monomelic neuropathy, failure to mature, and need for additional procedures. The patient is aware the risks of tunneled dialysis catheter placement include but are not limited to: bleeding,  infection, central venous injury, pneumothorax, possible venous stenosis, possible malpositioning in the venous system, and possible infections related to long-term catheter presence.   The patient is aware of the risks and agrees to proceed.   Adele Barthel, MD, FACS [facility] Office: [phone] Pager: [phone]  04/10/2017, 7:07 AM

## 2017-04-10 NOTE — Anesthesia Preprocedure Evaluation (Signed)
Anesthesia Evaluation  Patient identified by MRN, date of birth, ID band Patient awake    Reviewed: Allergy & Precautions, NPO status , Patient's Chart, lab work & pertinent test results  Airway Mallampati: II  TM Distance: >3 FB Neck ROM: Full    Dental  (+) Poor Dentition, Edentulous Upper, Dental Advisory Given   Pulmonary former smoker,    breath sounds clear to auscultation       Cardiovascular hypertension,  Rhythm:Regular Rate:Normal     Neuro/Psych    GI/Hepatic   Endo/Other    Renal/GU      Musculoskeletal   Abdominal   Peds  Hematology   Anesthesia Other Findings   Reproductive/Obstetrics                             Anesthesia Physical Anesthesia Plan  ASA: III  Anesthesia Plan: General   Post-op Pain Management:    Induction: Intravenous  PONV Risk Score and Plan: Ondansetron and Dexamethasone  Airway Management Planned: LMA  Additional Equipment:   Intra-op Plan:   Post-operative Plan:   Informed Consent: I have reviewed the patients History and Physical, chart, labs and discussed the procedure including the risks, benefits and alternatives for the proposed anesthesia with the patient or authorized representative who has indicated his/her understanding and acceptance.   Dental advisory given  Plan Discussed with: CRNA and Anesthesiologist  Anesthesia Plan Comments:         Anesthesia Quick Evaluation

## 2017-04-10 NOTE — Progress Notes (Signed)
Care of pt assumed by MA Sheily Lineman RN 

## 2017-04-10 NOTE — Progress Notes (Signed)
Dr. Bridgett Larsson and Dr. Linna Caprice notified that patient took coumadin this morning.  Dr. Linna Caprice also notified of patient's BP.  No new orders received.

## 2017-04-11 ENCOUNTER — Telehealth: Payer: Self-pay | Admitting: Vascular Surgery

## 2017-04-11 ENCOUNTER — Encounter (HOSPITAL_COMMUNITY): Payer: Self-pay | Admitting: Vascular Surgery

## 2017-04-11 DIAGNOSIS — E119 Type 2 diabetes mellitus without complications: Secondary | ICD-10-CM | POA: Diagnosis not present

## 2017-04-11 DIAGNOSIS — N186 End stage renal disease: Secondary | ICD-10-CM | POA: Diagnosis not present

## 2017-04-11 DIAGNOSIS — D509 Iron deficiency anemia, unspecified: Secondary | ICD-10-CM | POA: Diagnosis not present

## 2017-04-11 DIAGNOSIS — D631 Anemia in chronic kidney disease: Secondary | ICD-10-CM | POA: Diagnosis not present

## 2017-04-11 DIAGNOSIS — N2581 Secondary hyperparathyroidism of renal origin: Secondary | ICD-10-CM | POA: Diagnosis not present

## 2017-04-11 NOTE — Telephone Encounter (Signed)
-----   Message from Mena Goes, RN sent at 04/10/2017 10:04 AM EDT ----- Regarding: 4 weeks w/ BLC   ----- Message ----- From: Iline Oven Sent: 04/10/2017   9:43 AM To: Vvs Charge Pool  Can you schedule an appt with Dr. Bridgett Larsson in about 4 weeks.  PO L AV graft. Thanks, [facility]

## 2017-04-11 NOTE — Telephone Encounter (Signed)
LVM for pts appt 5/15  Mld lttr

## 2017-04-12 DIAGNOSIS — E119 Type 2 diabetes mellitus without complications: Secondary | ICD-10-CM | POA: Diagnosis not present

## 2017-04-12 DIAGNOSIS — D631 Anemia in chronic kidney disease: Secondary | ICD-10-CM | POA: Diagnosis not present

## 2017-04-12 DIAGNOSIS — D509 Iron deficiency anemia, unspecified: Secondary | ICD-10-CM | POA: Diagnosis not present

## 2017-04-12 DIAGNOSIS — N186 End stage renal disease: Secondary | ICD-10-CM | POA: Diagnosis not present

## 2017-04-12 DIAGNOSIS — N2581 Secondary hyperparathyroidism of renal origin: Secondary | ICD-10-CM | POA: Diagnosis not present

## 2017-04-14 ENCOUNTER — Other Ambulatory Visit: Payer: Self-pay

## 2017-04-14 ENCOUNTER — Emergency Department (HOSPITAL_COMMUNITY): Payer: Medicare Other

## 2017-04-14 ENCOUNTER — Encounter (HOSPITAL_COMMUNITY): Payer: Self-pay

## 2017-04-14 ENCOUNTER — Inpatient Hospital Stay (HOSPITAL_COMMUNITY)
Admission: EM | Admit: 2017-04-14 | Discharge: 2017-04-19 | DRG: 286 | Disposition: A | Discharge status: Home Health | Payer: Medicare Other | Attending: Internal Medicine | Admitting: Internal Medicine

## 2017-04-14 DIAGNOSIS — I5083 High output heart failure: Secondary | ICD-10-CM | POA: Diagnosis present

## 2017-04-14 DIAGNOSIS — Z992 Dependence on renal dialysis: Secondary | ICD-10-CM | POA: Diagnosis not present

## 2017-04-14 DIAGNOSIS — Z8673 Personal history of transient ischemic attack (TIA), and cerebral infarction without residual deficits: Secondary | ICD-10-CM

## 2017-04-14 DIAGNOSIS — D631 Anemia in chronic kidney disease: Secondary | ICD-10-CM | POA: Diagnosis present

## 2017-04-14 DIAGNOSIS — T827XXS Infection and inflammatory reaction due to other cardiac and vascular devices, implants and grafts, sequela: Secondary | ICD-10-CM | POA: Diagnosis not present

## 2017-04-14 DIAGNOSIS — T827XXA Infection and inflammatory reaction due to other cardiac and vascular devices, implants and grafts, initial encounter: Secondary | ICD-10-CM | POA: Diagnosis present

## 2017-04-14 DIAGNOSIS — I214 Non-ST elevation (NSTEMI) myocardial infarction: Secondary | ICD-10-CM | POA: Diagnosis not present

## 2017-04-14 DIAGNOSIS — I132 Hypertensive heart and chronic kidney disease with heart failure and with stage 5 chronic kidney disease, or end stage renal disease: Secondary | ICD-10-CM | POA: Diagnosis present

## 2017-04-14 DIAGNOSIS — Y841 Kidney dialysis as the cause of abnormal reaction of the patient, or of later complication, without mention of misadventure at the time of the procedure: Secondary | ICD-10-CM | POA: Diagnosis present

## 2017-04-14 DIAGNOSIS — I5042 Chronic combined systolic (congestive) and diastolic (congestive) heart failure: Secondary | ICD-10-CM | POA: Diagnosis present

## 2017-04-14 DIAGNOSIS — E785 Hyperlipidemia, unspecified: Secondary | ICD-10-CM | POA: Diagnosis present

## 2017-04-14 DIAGNOSIS — N186 End stage renal disease: Secondary | ICD-10-CM | POA: Diagnosis present

## 2017-04-14 DIAGNOSIS — I252 Old myocardial infarction: Secondary | ICD-10-CM | POA: Diagnosis not present

## 2017-04-14 DIAGNOSIS — Z7901 Long term (current) use of anticoagulants: Secondary | ICD-10-CM

## 2017-04-14 DIAGNOSIS — R748 Abnormal levels of other serum enzymes: Secondary | ICD-10-CM | POA: Diagnosis not present

## 2017-04-14 DIAGNOSIS — I953 Hypotension of hemodialysis: Secondary | ICD-10-CM | POA: Diagnosis present

## 2017-04-14 DIAGNOSIS — Z87891 Personal history of nicotine dependence: Secondary | ICD-10-CM

## 2017-04-14 DIAGNOSIS — I160 Hypertensive urgency: Secondary | ICD-10-CM | POA: Diagnosis not present

## 2017-04-14 DIAGNOSIS — Z419 Encounter for procedure for purposes other than remedying health state, unspecified: Secondary | ICD-10-CM

## 2017-04-14 DIAGNOSIS — I2510 Atherosclerotic heart disease of native coronary artery without angina pectoris: Secondary | ICD-10-CM | POA: Diagnosis present

## 2017-04-14 DIAGNOSIS — I429 Cardiomyopathy, unspecified: Secondary | ICD-10-CM | POA: Diagnosis present

## 2017-04-14 DIAGNOSIS — I25119 Atherosclerotic heart disease of native coronary artery with unspecified angina pectoris: Secondary | ICD-10-CM | POA: Diagnosis not present

## 2017-04-14 DIAGNOSIS — N2581 Secondary hyperparathyroidism of renal origin: Secondary | ICD-10-CM | POA: Diagnosis present

## 2017-04-14 DIAGNOSIS — J811 Chronic pulmonary edema: Secondary | ICD-10-CM

## 2017-04-14 DIAGNOSIS — I482 Chronic atrial fibrillation: Principal | ICD-10-CM | POA: Diagnosis present

## 2017-04-14 DIAGNOSIS — R7989 Other specified abnormal findings of blood chemistry: Secondary | ICD-10-CM | POA: Diagnosis not present

## 2017-04-14 DIAGNOSIS — I10 Essential (primary) hypertension: Secondary | ICD-10-CM | POA: Diagnosis not present

## 2017-04-14 DIAGNOSIS — R569 Unspecified convulsions: Secondary | ICD-10-CM

## 2017-04-14 DIAGNOSIS — Z66 Do not resuscitate: Secondary | ICD-10-CM | POA: Diagnosis present

## 2017-04-14 DIAGNOSIS — I700 Atherosclerosis of aorta: Secondary | ICD-10-CM | POA: Diagnosis not present

## 2017-04-14 DIAGNOSIS — Z85528 Personal history of other malignant neoplasm of kidney: Secondary | ICD-10-CM | POA: Diagnosis not present

## 2017-04-14 DIAGNOSIS — I272 Pulmonary hypertension, unspecified: Secondary | ICD-10-CM | POA: Diagnosis present

## 2017-04-14 DIAGNOSIS — N19 Unspecified kidney failure: Secondary | ICD-10-CM | POA: Diagnosis not present

## 2017-04-14 DIAGNOSIS — D62 Acute posthemorrhagic anemia: Secondary | ICD-10-CM | POA: Diagnosis present

## 2017-04-14 DIAGNOSIS — J45909 Unspecified asthma, uncomplicated: Secondary | ICD-10-CM | POA: Diagnosis present

## 2017-04-14 DIAGNOSIS — K219 Gastro-esophageal reflux disease without esophagitis: Secondary | ICD-10-CM | POA: Diagnosis present

## 2017-04-14 DIAGNOSIS — J810 Acute pulmonary edema: Secondary | ICD-10-CM

## 2017-04-14 DIAGNOSIS — R079 Chest pain, unspecified: Secondary | ICD-10-CM | POA: Diagnosis not present

## 2017-04-14 DIAGNOSIS — I5032 Chronic diastolic (congestive) heart failure: Secondary | ICD-10-CM | POA: Diagnosis not present

## 2017-04-14 DIAGNOSIS — E039 Hypothyroidism, unspecified: Secondary | ICD-10-CM | POA: Diagnosis present

## 2017-04-14 DIAGNOSIS — Z7951 Long term (current) use of inhaled steroids: Secondary | ICD-10-CM

## 2017-04-14 DIAGNOSIS — K649 Unspecified hemorrhoids: Secondary | ICD-10-CM | POA: Diagnosis present

## 2017-04-14 DIAGNOSIS — R778 Other specified abnormalities of plasma proteins: Secondary | ICD-10-CM

## 2017-04-14 DIAGNOSIS — I4891 Unspecified atrial fibrillation: Secondary | ICD-10-CM | POA: Diagnosis not present

## 2017-04-14 DIAGNOSIS — Z955 Presence of coronary angioplasty implant and graft: Secondary | ICD-10-CM | POA: Diagnosis not present

## 2017-04-14 DIAGNOSIS — R072 Precordial pain: Secondary | ICD-10-CM | POA: Diagnosis not present

## 2017-04-14 DIAGNOSIS — G40909 Epilepsy, unspecified, not intractable, without status epilepticus: Secondary | ICD-10-CM | POA: Diagnosis not present

## 2017-04-14 DIAGNOSIS — E8770 Fluid overload, unspecified: Secondary | ICD-10-CM | POA: Diagnosis not present

## 2017-04-14 DIAGNOSIS — Z09 Encounter for follow-up examination after completed treatment for conditions other than malignant neoplasm: Secondary | ICD-10-CM

## 2017-04-14 DIAGNOSIS — G4733 Obstructive sleep apnea (adult) (pediatric): Secondary | ICD-10-CM | POA: Diagnosis present

## 2017-04-14 DIAGNOSIS — I120 Hypertensive chronic kidney disease with stage 5 chronic kidney disease or end stage renal disease: Secondary | ICD-10-CM | POA: Diagnosis not present

## 2017-04-14 DIAGNOSIS — I739 Peripheral vascular disease, unspecified: Secondary | ICD-10-CM | POA: Diagnosis present

## 2017-04-14 DIAGNOSIS — Z452 Encounter for adjustment and management of vascular access device: Secondary | ICD-10-CM | POA: Diagnosis not present

## 2017-04-14 DIAGNOSIS — I493 Ventricular premature depolarization: Secondary | ICD-10-CM | POA: Diagnosis present

## 2017-04-14 DIAGNOSIS — Z79899 Other long term (current) drug therapy: Secondary | ICD-10-CM

## 2017-04-14 LAB — BASIC METABOLIC PANEL
Anion gap: 13 (ref 5–15)
BUN: 34 mg/dL — AB (ref 6–20)
CALCIUM: 8.8 mg/dL — AB (ref 8.9–10.3)
CHLORIDE: 92 mmol/L — AB (ref 101–111)
CO2: 30 mmol/L (ref 22–32)
CREATININE: 6.81 mg/dL — AB (ref 0.44–1.00)
GFR calc non Af Amer: 5 mL/min — ABNORMAL LOW (ref >60)
GFR, EST AFRICAN AMERICAN: 6 mL/min — AB (ref >60)
GLUCOSE: 104 mg/dL — AB (ref 65–99)
Potassium: 4.1 mmol/L (ref 3.5–5.1)
Sodium: 135 mmol/L (ref 135–145)

## 2017-04-14 LAB — PROTIME-INR
INR: 1.52
PROTHROMBIN TIME: 18.1 s — AB (ref 11.4–15.2)

## 2017-04-14 LAB — CBC
HCT: 23.8 % — ABNORMAL LOW (ref 36.0–46.0)
Hemoglobin: 7.6 g/dL — ABNORMAL LOW (ref 12.0–15.0)
MCH: 26.1 pg (ref 26.0–34.0)
MCHC: 31.9 g/dL (ref 30.0–36.0)
MCV: 81.8 fL (ref 78.0–100.0)
PLATELETS: 138 10*3/uL — AB (ref 150–400)
RBC: 2.91 MIL/uL — AB (ref 3.87–5.11)
RDW: 17.9 % — AB (ref 11.5–15.5)
WBC: 4.5 10*3/uL (ref 4.0–10.5)

## 2017-04-14 LAB — PREPARE RBC (CROSSMATCH)

## 2017-04-14 LAB — I-STAT TROPONIN, ED: TROPONIN I, POC: 0.26 ng/mL — AB (ref 0.00–0.08)

## 2017-04-14 MED ORDER — ASPIRIN 81 MG PO CHEW
324.0000 mg | CHEWABLE_TABLET | Freq: Once | ORAL | Status: DC
  Filled 2017-04-14: qty 4

## 2017-04-14 MED ORDER — MIDODRINE HCL 5 MG PO TABS
10.0000 mg | ORAL_TABLET | Freq: Once | ORAL | Status: AC
  Administered 2017-04-14: 10 mg via ORAL

## 2017-04-14 MED ORDER — DIPHENHYDRAMINE HCL 25 MG PO CAPS
50.0000 mg | ORAL_CAPSULE | ORAL | Status: DC
  Filled 2017-04-14: qty 2

## 2017-04-14 MED ORDER — TRAZODONE HCL 50 MG PO TABS
50.0000 mg | ORAL_TABLET | Freq: Every evening | ORAL | Status: DC | PRN
  Administered 2017-04-15 – 2017-04-19 (×2): 50 mg via ORAL
  Filled 2017-04-14 (×2): qty 1

## 2017-04-14 MED ORDER — SEVELAMER CARBONATE 800 MG PO TABS
1600.0000 mg | ORAL_TABLET | Freq: Three times a day (TID) | ORAL | Status: DC
  Administered 2017-04-15: 1600 mg via ORAL
  Filled 2017-04-14: qty 2

## 2017-04-14 MED ORDER — LIDOCAINE-PRILOCAINE 2.5-2.5 % EX CREA: 1.0000 "application " | TOPICAL_CREAM | CUTANEOUS | Status: DC | PRN

## 2017-04-14 MED ORDER — DIALYVITE 3000 3 MG PO TABS: 1.0000 | ORAL_TABLET | Freq: Every day | ORAL | Status: DC

## 2017-04-14 MED ORDER — RENA-VITE PO TABS
1.0000 | ORAL_TABLET | Freq: Every day | ORAL | Status: DC
  Administered 2017-04-15 – 2017-04-18 (×5): 1 via ORAL
  Filled 2017-04-14 (×5): qty 1

## 2017-04-14 MED ORDER — ASPIRIN EC 81 MG PO TBEC
81.0000 mg | DELAYED_RELEASE_TABLET | Freq: Every day | ORAL | Status: DC
  Administered 2017-04-15 – 2017-04-18 (×4): 81 mg via ORAL
  Filled 2017-04-14 (×5): qty 1

## 2017-04-14 MED ORDER — ENOXAPARIN SODIUM 80 MG/0.8ML ~~LOC~~ SOLN
75.0000 mg | SUBCUTANEOUS | Status: DC
  Filled 2017-04-14: qty 0.8

## 2017-04-14 MED ORDER — HEPARIN SODIUM (PORCINE) 1000 UNIT/ML DIALYSIS: 2000.0000 [IU] | INTRAMUSCULAR | Status: DC | PRN

## 2017-04-14 MED ORDER — CARVEDILOL 25 MG PO TABS
25.0000 mg | ORAL_TABLET | Freq: Two times a day (BID) | ORAL | Status: DC
  Administered 2017-04-15: 25 mg via ORAL
  Filled 2017-04-14: qty 1

## 2017-04-14 MED ORDER — ALBUTEROL SULFATE HFA 108 (90 BASE) MCG/ACT IN AERS: 1.0000 | INHALATION_SPRAY | Freq: Four times a day (QID) | RESPIRATORY_TRACT | Status: DC | PRN

## 2017-04-14 MED ORDER — ALBUMIN HUMAN 25 % IV SOLN
25.0000 g | Freq: Once | INTRAVENOUS | Status: AC
  Administered 2017-04-14: 25 g via INTRAVENOUS

## 2017-04-14 MED ORDER — ONDANSETRON HCL 4 MG/2ML IJ SOLN: 4.0000 mg | Freq: Four times a day (QID) | INTRAMUSCULAR | Status: DC | PRN

## 2017-04-14 MED ORDER — MIDODRINE HCL 5 MG PO TABS
ORAL_TABLET | ORAL | Status: AC
  Administered 2017-04-14: 10 mg via ORAL
  Filled 2017-04-14: qty 2

## 2017-04-14 MED ORDER — ISOSORBIDE DINITRATE 20 MG PO TABS
20.0000 mg | ORAL_TABLET | Freq: Three times a day (TID) | ORAL | Status: DC
  Administered 2017-04-15 (×2): 20 mg via ORAL
  Filled 2017-04-14: qty 1
  Filled 2017-04-14: qty 2
  Filled 2017-04-14 (×3): qty 1

## 2017-04-14 MED ORDER — NITROGLYCERIN 0.4 MG SL SUBL: 0.4000 mg | SUBLINGUAL_TABLET | SUBLINGUAL | Status: DC | PRN

## 2017-04-14 MED ORDER — LEVETIRACETAM 500 MG PO TABS
500.0000 mg | ORAL_TABLET | Freq: Every day | ORAL | Status: DC
  Administered 2017-04-15 – 2017-04-18 (×5): 500 mg via ORAL
  Filled 2017-04-14 (×5): qty 1

## 2017-04-14 MED ORDER — NITROGLYCERIN 0.4 MG SL SUBL
0.4000 mg | SUBLINGUAL_TABLET | SUBLINGUAL | Status: DC | PRN
  Administered 2017-04-14 – 2017-04-18 (×3): 0.4 mg via SUBLINGUAL
  Filled 2017-04-14 (×3): qty 1

## 2017-04-14 MED ORDER — SODIUM CHLORIDE 0.9 % IV SOLN
2.0000 g | INTRAVENOUS | Status: DC
  Administered 2017-04-17 – 2017-04-18 (×2): 2 g via INTRAVENOUS
  Filled 2017-04-14 (×4): qty 2

## 2017-04-14 MED ORDER — CILOSTAZOL 100 MG PO TABS
100.0000 mg | ORAL_TABLET | Freq: Two times a day (BID) | ORAL | Status: DC
Start: 2017-04-15 — End: 2017-04-19
  Administered 2017-04-15 – 2017-04-19 (×9): 100 mg via ORAL
  Filled 2017-04-14 (×9): qty 1

## 2017-04-14 MED ORDER — PRAVASTATIN SODIUM 40 MG PO TABS
40.0000 mg | ORAL_TABLET | Freq: Every day | ORAL | Status: DC
  Administered 2017-04-15 – 2017-04-18 (×5): 40 mg via ORAL
  Filled 2017-04-14 (×5): qty 1

## 2017-04-14 MED ORDER — DILTIAZEM HCL ER COATED BEADS 120 MG PO CP24
120.0000 mg | ORAL_CAPSULE | Freq: Every day | ORAL | Status: DC
  Administered 2017-04-15 – 2017-04-16 (×2): 120 mg via ORAL
  Filled 2017-04-14 (×2): qty 1

## 2017-04-14 MED ORDER — SODIUM CHLORIDE 0.9 % IV SOLN
2.0000 g | Freq: Once | INTRAVENOUS | Status: AC
  Administered 2017-04-15: 2 g via INTRAVENOUS
  Filled 2017-04-14: qty 2

## 2017-04-14 MED ORDER — ALBUTEROL SULFATE (2.5 MG/3ML) 0.083% IN NEBU: 2.5000 mg | INHALATION_SOLUTION | Freq: Four times a day (QID) | RESPIRATORY_TRACT | Status: DC | PRN

## 2017-04-14 MED ORDER — LEVOTHYROXINE SODIUM 75 MCG PO TABS
150.0000 ug | ORAL_TABLET | Freq: Every day | ORAL | Status: DC
  Administered 2017-04-15 – 2017-04-19 (×5): 150 ug via ORAL
  Filled 2017-04-14 (×3): qty 2
  Filled 2017-04-14: qty 1
  Filled 2017-04-14 (×3): qty 2

## 2017-04-14 MED ORDER — ALBUMIN HUMAN 25 % IV SOLN
INTRAVENOUS | Status: AC
  Administered 2017-04-14: 25 g via INTRAVENOUS
  Filled 2017-04-14: qty 100

## 2017-04-14 MED ORDER — SODIUM CHLORIDE 0.9 % IV SOLN: 100.0000 mL | INTRAVENOUS | Status: DC | PRN

## 2017-04-14 MED ORDER — AMBRISENTAN 5 MG PO TABS
5.0000 mg | ORAL_TABLET | Freq: Every day | ORAL | Status: DC
  Administered 2017-04-15 – 2017-04-19 (×5): 5 mg via ORAL
  Filled 2017-04-14 (×5): qty 1

## 2017-04-14 MED ORDER — HYDROCODONE-ACETAMINOPHEN 5-325 MG PO TABS
1.0000 | ORAL_TABLET | Freq: Four times a day (QID) | ORAL | Status: DC | PRN
  Administered 2017-04-16 – 2017-04-17 (×3): 1 via ORAL
  Filled 2017-04-14 (×5): qty 1

## 2017-04-14 MED ORDER — ACETAMINOPHEN 500 MG PO TABS: 1000.0000 mg | ORAL_TABLET | Freq: Every day | ORAL | Status: DC | PRN

## 2017-04-14 MED ORDER — VANCOMYCIN HCL IN DEXTROSE 750-5 MG/150ML-% IV SOLN
750.0000 mg | INTRAVENOUS | Status: DC
  Administered 2017-04-18: 750 mg via INTRAVENOUS
  Filled 2017-04-14 (×3): qty 150

## 2017-04-14 MED ORDER — METOPROLOL TARTRATE 5 MG/5ML IV SOLN
2.5000 mg | INTRAVENOUS | Status: AC
  Administered 2017-04-14: 2.5 mg via INTRAVENOUS
  Filled 2017-04-14: qty 5

## 2017-04-14 MED ORDER — PENTAFLUOROPROP-TETRAFLUOROETH EX AERO: 1.0000 "application " | INHALATION_SPRAY | CUTANEOUS | Status: DC | PRN

## 2017-04-14 MED ORDER — PANTOPRAZOLE SODIUM 40 MG PO TBEC
40.0000 mg | DELAYED_RELEASE_TABLET | Freq: Every day | ORAL | Status: DC
  Administered 2017-04-15 – 2017-04-19 (×5): 40 mg via ORAL
  Filled 2017-04-14 (×5): qty 1

## 2017-04-14 MED ORDER — VANCOMYCIN HCL 10 G IV SOLR
1500.0000 mg | Freq: Once | INTRAVENOUS | Status: AC
  Administered 2017-04-14: 1500 mg via INTRAVENOUS
  Filled 2017-04-14: qty 1500

## 2017-04-14 MED ORDER — DILTIAZEM HCL-DEXTROSE 100-5 MG/100ML-% IV SOLN (PREMIX)
5.0000 mg/h | INTRAVENOUS | Status: DC
  Administered 2017-04-15: 5 mg/h via INTRAVENOUS
  Filled 2017-04-14: qty 100

## 2017-04-14 MED ORDER — FENTANYL CITRATE (PF) 100 MCG/2ML IJ SOLN
50.0000 ug | Freq: Once | INTRAMUSCULAR | Status: AC
  Administered 2017-04-14: 50 ug via INTRAVENOUS
  Filled 2017-04-14: qty 2

## 2017-04-14 MED ORDER — CINACALCET HCL 30 MG PO TABS: 90.0000 mg | ORAL_TABLET | ORAL | Status: DC

## 2017-04-14 MED ORDER — SODIUM CHLORIDE 0.9 % IV SOLN
Freq: Once | INTRAVENOUS | Status: AC
  Administered 2017-04-17: 07:00:00 via INTRAVENOUS

## 2017-04-14 NOTE — ED Provider Notes (Signed)
[facility] EMERGENCY DEPARTMENT Provider Note   CSN: [id number] Arrival date & time: 04/14/17  1412     History   Chief Complaint Chief Complaint  Patient presents with  . Chest Pain    HPI [name] is a 72 y.o. female.  The history is provided by the patient.  Chest Pain   This is a new problem. The current episode started 3 to 5 hours ago. The problem occurs constantly. The problem has not changed since onset.The pain is associated with rest. The pain is present in the substernal region. The pain is at a severity of 5/10. The pain is moderate. The quality of the pain is described as pressure-like. The pain does not radiate. Associated symptoms include shortness of breath. Pertinent negatives include no abdominal pain, no back pain, no cough, no fever, no headaches, no hemoptysis, no irregular heartbeat, no leg pain, no lower extremity edema, no nausea, no near-syncope, no palpitations, no sputum production, no syncope and no vomiting. She has tried nitroglycerin for the symptoms. The treatment provided mild relief.  Her past medical history is significant for CAD, hyperlipidemia and hypertension.  Pertinent negatives for past medical history include no seizures. Past medical history comments: ESRD M/W/F dialysis with last dialysis thursday  Procedure history is positive for cardiac catheterization.    Past Medical History:  Diagnosis Date  . Anemia    History  of Blood transfusion  . Asthma    PMH  . Atrial fibrillation with RVR ([place]) 11/2016  . CAD (coronary artery disease)    stent to RCA  . Cancer ([place])    clear cell cancer, kidney  . Chronic renal insufficiency    On hemodialysis  . Complication of anesthesia 12/2010   pt is very confused, with AMS with anesthesia  . CVA 07/27/2008   CVA affected cognition and memory per family, no focal deficits.    . CVA (cerebral infarction) 2003   no apparent residual  . Diastolic congestive heart  failure ([place])   . Dyslipidemia   . Encephalopathy   . ESRD 07/27/2008   ESRD due to HTN and NSAID's, started hemodialysis in 2005 in [place], [place]. Went to [facility] from 2010 to 2012 and since 2012 has been getting dialysis at [facility] on [address] in [place] on a MWF schedule. First access with RUA AVG placed in [place]. Next and current access was LUA AVG placed by Dr. Lucky Cowboy in [place] in or around 2012. Has had 2 or 3 procedures on that graft since placed per family. She gets her access work done here in [place] now. She is allergic to heparin and does not get any heparin at dialysis; she had an allergic reaction apparently when in ICU in the past.      . GERD (gastroesophageal reflux disease)   . Hyperlipidemia   . Hypertension   . Hypothyroidism   . Myocardial infarction ([place])    per pt  . Positive PPD    completed rifampin  . Pulmonary hypertension ([place])   . PVD (peripheral vascular disease) ([place])   . Seizures ([place])    last seizure 6 years ago  . Sleep apnea    Sleep Study 2008  . Traumatic seroma of left lower leg ([place])   . Wears glasses   . Wears partial dentures     Patient Active Problem List   Diagnosis Date Noted  . Atrial fibrillation ([place]) [I48.91] 12/08/2016  . Long term (current) use  of anticoagulants [Z79.01] 12/08/2016  . NSTEMI (non-ST elevated myocardial infarction) ([place]) 11/21/2016  . Atrial fibrillation with RVR ([place]) 11/20/2016  . Precordial pain 10/26/2016  . Hematoma 09/12/2016  . Dyslipidemia 05/25/2016  . Coronary artery disease involving native coronary artery of native heart without angina pectoris 05/25/2016  . Atherosclerosis of native arteries of extremity with intermittent claudication ([place]) 12/16/2014  . Nausea vomiting and diarrhea 09/03/2014  . Hyperkalemia 09/03/2014  . Diarrhea 09/03/2014  . ESRD (end stage renal disease) ([place])   . Pulmonary hypertension ([place]) 09/23/2012  . Hyperkalemia, diminished renal excretion  06/27/2012  . Generalized convulsive epilepsy without mention of intractable epilepsy 09/14/2011  . Epilepsy ([place]) 05/04/2011  . Physical deconditioning 05/04/2011  . HTN (hypertension) 04/30/2011  . Depression 04/30/2011  . GERD (gastroesophageal reflux disease) 04/30/2011  . Seizure ([place]) 04/29/2011  . Nonspecific abnormal electrocardiogram (ECG) (EKG) 02/23/2011  . Essential hypertension, malignant 05/11/2009  . Chronic diastolic heart failure ([place]) 05/11/2009  . TOBACCO USER 07/27/2008  . Coronary atherosclerosis 07/27/2008  . CVA 07/27/2008  . PVD 07/27/2008  . ESRD 07/27/2008  . OTHER AND UNSPECIFIED HYPERLIPIDEMIA 07/24/2008    Past Surgical History:  Procedure Laterality Date  . ABDOMINAL AORTOGRAM W/LOWER EXTREMITY Bilateral 06/15/2016   Procedure: Abdominal Aortogram w/Lower Extremity;  Surgeon: Conrad Marietta, MD;  Location: [facility];  Service: Cardiovascular;  Laterality: Bilateral;  . ABDOMINAL HYSTERECTOMY    . APPENDECTOMY    . APPLICATION OF WOUND VAC Left 09/12/2016   Procedure: APPLICATION OF WOUND VAC;  Surgeon: Conrad Pisek, MD;  Location: [facility];  Service: Vascular;  Laterality: Left;  . ARTERY EXPLORATION Left 07/25/2016   Procedure: ARTERY EXPLORATION LEFT ABOVE KNEE POPLITEAL AND GROIN;  Surgeon: Conrad [place], MD;  Location: [facility];  Service: Vascular;  Laterality: Left;  . AV FISTULA PLACEMENT Left 04/10/2017   Procedure: INSERTION OF ARTERIOVENOUS (AV) GORE-TEX GRAFT REDO LEFT UPPER ARM;  Surgeon: Conrad Roseland, MD;  Location: [facility];  Service: Vascular;  Laterality: Left;  . CARDIAC CATHETERIZATION     last 2016  . CHOLECYSTECTOMY    . COLONOSCOPY    . D&Cs    . HEMATOMA EVACUATION Left 09/12/2016   Procedure: EVACUATION HEMATOMA;  Surgeon: Conrad Hartland, MD;  Location: [facility];  Service: Vascular;  Laterality: Left;  . INSERTION OF DIALYSIS CATHETER Right 04/10/2017   Procedure: INSERTION OF DIALYSIS CATHETER;  Surgeon: Conrad Liberty, MD;  Location: [facility]  OR;  Service: Vascular;  Laterality: Right;  . IR RADIOLOGIST EVAL & MGMT  03/13/2017  . LEFT HEART CATH AND CORONARY ANGIOGRAPHY N/A 11/22/2016   Procedure: LEFT HEART CATH AND CORONARY ANGIOGRAPHY;  Surgeon: Belva Crome, MD;  Location: [facility] CV LAB;  Service: Cardiovascular;  Laterality: N/A;  . LEFT HEART CATHETERIZATION WITH CORONARY ANGIOGRAM N/A 02/22/2011   Procedure: LEFT HEART CATHETERIZATION WITH CORONARY ANGIOGRAM;  Surgeon: Wellington Hampshire, MD;  Location: [facility] CATH LAB;  Service: Cardiovascular;  Laterality: N/A;  . left nephrectomy    . MULTIPLE TOOTH EXTRACTIONS    . NEPHRECTOMY Left    Malignant tumor  . PERIPHERAL VASCULAR CATHETERIZATION N/A 12/31/2014   Procedure: Abdominal Aortogram;  Surgeon: Conrad [place], MD;  Location: [facility] CV LAB;  Service: Cardiovascular;  Laterality: N/A;  . right ankle repair    . RIGHT HEART CATHETERIZATION N/A 01/29/2014   Procedure: RIGHT HEART CATH;  Surgeon: Larey Dresser, MD;  Location: [facility] C[facility] LAB;  Service: Cardiovascular;  Laterality: N/A;  . TONSILLECTOMY    . TUBAL LIGATION    . ULTRASOUND GUIDANCE FOR VASCULAR ACCESS  11/22/2016   Procedure: Ultrasound Guidance For Vascular Access;  Surgeon: Belva Crome, MD;  Location: [facility] CV LAB;  Service: Cardiovascular;;  . UPPER EXTREMITY VENOGRAPHY Bilateral 03/22/2017   Procedure: UPPER EXTREMITY VENOGRAPHY;  Surgeon: Conrad [place], MD;  Location: [facility];  Service: Cardiovascular;  Laterality: Bilateral;  bilateral  . VASCULAR SURGERY  11/2010   graft inserted to left arm     OB History   None      Home Medications    Prior to Admission medications   Medication Sig Start Date End Date Taking? Authorizing Provider  acetaminophen (TYLENOL) 500 MG tablet Take 1,000 mg by mouth daily as needed for mild pain.    Yes [provider]  albuterol (PROVENTIL HFA;VENTOLIN HFA) 108 (90 Base) MCG/ACT inhaler Inhale 1-2 puffs into the lungs every 6 (six)  hours as needed for wheezing or shortness of breath. 01/05/17  Yes Marney Setting, NP  carvedilol (COREG) 25 MG tablet Take 25 mg by mouth 2 (two) times daily with a meal.   Yes [provider]  cilostazol (PLETAL) 100 MG tablet Take 1 tablet (100 mg total) by mouth 2 (two) times daily before a meal. 05/24/16  Yes Conrad Elgin, MD  diltiazem (CARDIZEM CD) 120 MG 24 hr capsule Take 1 capsule (120 mg total) by mouth daily. 03/01/17  Yes Minus Breeding, MD  diphenhydrAMINE (BENADRYL) 50 MG capsule Take 1 capsule (50 mg total) by mouth every Monday, Wednesday, and Friday. On dialysis days 09/15/16  Yes Rhyne, Samantha J, PA-C  enoxaparin (LOVENOX) 80 MG/0.8ML injection Inject 0.8 mLs (80 mg total) into the skin daily. 04/03/17  Yes Minus Breeding, MD  folic acid-vitamin b complex-vitamin c-selenium-zinc (DIALYVITE) 3 MG TABS tablet Take 1 tablet by mouth daily.   Yes [provider]  hydrALAZINE (APRESOLINE) 25 MG tablet Take 25 mg by mouth 3 (three) times daily.    Yes [provider]  HYDROcodone-acetaminophen (NORCO) 5-325 MG tablet Take 1 tablet by mouth every 6 (six) hours as needed for moderate pain. 04/10/17  Yes Dagoberto Ligas, PA-C  isosorbide dinitrate (ISORDIL) 20 MG tablet Take 1 tablet (20 mg total) by mouth 3 (three) times daily. 10/26/16  Yes Hochrein, Jeneen Rinks, MD  LETAIRIS 5 MG tablet TAKE 1 TABLET (5MG ) BY MOUTH DAILY. DO NOT HANDLE IF PREGNANT. DO NOTSPLIT, CRUSH, OR CHEW. AVOID INHALATION AND CONTACT WITH SKIN OR EYES. 09/18/16  Yes Juanito Doom, MD  levETIRAcetam (KEPPRA) 500 MG tablet Take 500 mg by mouth daily at 8 pm.   Yes [provider]  levothyroxine (SYNTHROID, LEVOTHROID) 150 MCG tablet Take 150 mcg by mouth daily before breakfast.   Yes [provider]  nitroGLYCERIN (NITROSTAT) 0.4 MG SL tablet Place 1 tablet (0.4 mg total) under the tongue every 5 (five) minutes as needed for chest pain. 10/26/16  Yes Minus Breeding, MD    omeprazole (PRILOSEC OTC) 20 MG tablet Take 20 mg by mouth daily.   Yes [provider]  pravastatin (PRAVACHOL) 40 MG tablet Take 1 tablet (40 mg total) by mouth at bedtime. 05/23/16  Yes Minus Breeding, MD  RENVELA 800 MG tablet Take 1,600 mg by mouth 3 (three) times daily with meals. May take 1600 mg with each snack 03/08/17  Yes [provider]  SENSIPAR 90 MG tablet Take 90 mg by mouth  every Monday, Wednesday, and Friday with hemodialysis.  03/05/14  Yes [provider]  traZODone (DESYREL) 50 MG tablet Take 50 mg by mouth at bedtime as needed for sleep.    Yes [provider]  warfarin (COUMADIN) 7.5 MG tablet Take 1/2 to 1 tablet daily as directed by coumadin clinic 04/05/17  Yes Minus Breeding, MD    Family History Family History  Problem Relation Age of Onset  . Heart disease Father   . Hypertension Mother   . Dementia Mother   . Coronary artery disease Sister   . Heart attack Sister   . Hypertension Brother     Social History Social History   Tobacco Use  . Smoking status: Former Smoker    Packs/day: 0.00    Years: 53.00    Pack years: 0.00    Types: Cigarettes  . Smokeless tobacco: Never Used  . Tobacco comment: quit smoking cigarettes in January 2019  Substance Use Topics  . Alcohol use: Yes    Alcohol/week: 0.0 oz    Comment: very rarely per pt  . Drug use: No    Comment: former marijuana use, several years     Allergies   Ace inhibitors; Heparin; Iohexol; Phenytoin; Wellbutrin [bupropion]; Meperidine hcl; Morphine; Penicillins; Valproic acid and related; Iodinated diagnostic agents; and Pentazocine lactate   Review of Systems Review of Systems  Constitutional: Negative for chills and fever.  HENT: Negative for ear pain and sore throat.   Eyes: Negative for pain and visual disturbance.  Respiratory: Positive for shortness of breath. Negative for cough, hemoptysis and sputum production.   Cardiovascular: Positive for chest  pain. Negative for palpitations, syncope and near-syncope.  Gastrointestinal: Negative for abdominal pain, nausea and vomiting.  Genitourinary: Negative for dysuria and hematuria.  Musculoskeletal: Negative for arthralgias and back pain.  Skin: Negative for color change and rash.  Neurological: Negative for seizures, syncope and headaches.  All other systems reviewed and are negative.    Physical Exam Updated Vital Signs BP 127/77 (BP Location: Right Arm)   Pulse (!) 118   Temp 98.8 F (37.1 C) (Oral)   Resp 16   Ht 5' 2.5" (1.588 m)   Wt 75.3 kg (166 lb)   SpO2 97%   BMI 29.88 kg/m   Physical Exam  Constitutional: She appears well-developed and well-nourished. No distress.  HENT:  Head: Normocephalic and atraumatic.  Eyes: Conjunctivae are normal.  Neck: Normal range of motion. Neck supple.  Cardiovascular: Normal rate, intact distal pulses and normal pulses. An irregularly irregular rhythm present.  No murmur heard. Pulmonary/Chest: Tachypnea noted. No respiratory distress. She has decreased breath sounds. She has rales.  Abdominal: Soft. There is no tenderness.  Musculoskeletal:       Right lower leg: She exhibits edema.       Left lower leg: She exhibits edema.  Neurological: She is alert.  Skin: Skin is warm and dry.  Psychiatric: She has a normal mood and affect.  Nursing note and vitals reviewed.    ED Treatments / Results  Labs (all labs ordered are listed, but only abnormal results are displayed) Labs Reviewed  BASIC METABOLIC PANEL - Abnormal; Notable for the following components:      Result Value   Chloride 92 (*)    Glucose, Bld 104 (*)    BUN 34 (*)    Creatinine, Ser 6.81 (*)    Calcium 8.8 (*)    GFR calc non Af Amer 5 (*)  GFR calc Af Amer 6 (*)    All other components within normal limits  CBC - Abnormal; Notable for the following components:   RBC 2.91 (*)    Hemoglobin 7.6 (*)    HCT 23.8 (*)    RDW 17.9 (*)    Platelets 138 (*)     All other components within normal limits  PROTIME-INR - Abnormal; Notable for the following components:   Prothrombin Time 18.1 (*)    All other components within normal limits  I-STAT TROPONIN, ED - Abnormal; Notable for the following components:   Troponin i, poc 0.26 (*)    All other components within normal limits  BRAIN NATRIURETIC PEPTIDE  TYPE AND SCREEN    EKG None  Radiology Dg Chest 2 View  Result Date: 04/14/2017 CLINICAL DATA:  Chest pain today EXAM: CHEST - 2 VIEW COMPARISON:  Chest x-rays dated 04/10/2017 and 01/05/2017. FINDINGS: Stable cardiomegaly. Aortic atherosclerosis. Central catheter from femoral pro ches stable in position with tip at the expected level of the cavoatrial junction. Diffuse interstitial prominence is stable compared to recent exam, increased compared to an earlier chest x-ray of 10/06/2013. no confluent opacity to suggest a consolidating pneumonia. No pleural effusion or pneumothorax seen. No acute or suspicious osseous finding. IMPRESSION: 1. Cardiomegaly with persistent interstitial prominence, presumed interstitial edema, compatible with chronic mild CHF. No evidence of overt alveolar pulmonary edema. No evidence of pneumonia. 2. Aortic atherosclerosis. Electronically Signed   By: Franki Cabot M.D.   On: 04/14/2017 14:59    Procedures Procedures (including critical care time)  Medications Ordered in ED Medications  nitroGLYCERIN (NITROSTAT) SL tablet 0.4 mg (0.4 mg Sublingual Given 04/14/17 1603)  fentaNYL (SUBLIMAZE) injection 50 mcg (50 mcg Intravenous Given 04/14/17 1556)     Initial Impression / Assessment and Plan / ED Course  I have reviewed the triage vital signs and the nursing notes.  Pertinent labs & imaging results that were available during my care of the patient were reviewed by me and considered in my medical decision making (see chart for details).     [name] is a 72 year old female history of end-stage renal  disease with hemodialysis on Monday Wednesday Friday, coronary artery disease, atrial fibrillation on Coumadin who presents to the ED with chest pain.  Patient with overall unremarkable vitals.  Patient had EKG performed that showed atrial fibrillation with heart rate of 120s but no signs of ischemia.  Heart rate upon my evaluation is in the 60s and 70s.  Patient has had central chest pressure for the last several hours.  Patient took some nitroglycerin with some relief.  Patient ready given aspirin by EMS.  Patient went to dialysis on Thursday and is due again on Monday.  She does have some shortness of breath.  She states that she is likely above her dry weight.  She states that she has been compliant with her medications.  Patient with recent heart catheterization this past December with multiple sites of disease but there was no stent placed at that time.  Patient was medically managed.  States that she does have intermittent chest pain.  Patient with elevated troponin while in the first look process.  Concern for ACS.  No DVT or PE risk factors.  Patient is on Coumadin as well.  Patient does not have any infectious symptoms but will get chest x-ray to evaluate for pneumonia.  Patient with creatinine at baseline.  Potassium within normal limits.  Patient with chest x-ray  does show some volume overload but no signs of pneumonia.  Patient with hemoglobin at 7.6 but does not have any melena or hematochezia on exam.  She denies any recent history of the same as well.  Patient is on Epogen and gets it at dialysis.  Patient has subtherapeutic INR as well.  Patient with likely multifactorial chest pain but given cardiac history and dialysis history will admit for further ACS rule out.  May benefit from dialysis as well. HDS throughout my care. Given IV fentanyl and nitro for pain. Renal consulted and will evaluate the patient as well.   Final Clinical Impressions(s) / ED Diagnoses   Final diagnoses:  Chest pain,  unspecified type  Elevated troponin    ED Discharge Orders    None       Lennice Sites, DO 04/14/17 1622    Elnora Morrison, MD 04/15/17 [phone]

## 2017-04-14 NOTE — H&P (Addendum)
History and Physical:    [name]   PPJ:[id number] DOB: February 03, 1945 DOA: 04/14/2017  Referring MD/provider: Nicki Reaper PCP: Glendale Chard, MD   Patient coming from: Home  Chief Complaint: chest pain   History of Present Illness:   [name] is an 72 y.o. female with past medical history significant for end-stage renal disease on hemodialysis, coronary artery disease, congestive heart failure, pulmonary hypertension, atrial fibrillation on warfarin who presents for chest pain. Patient states that she underwent revision of her graft in her left upper extremity 2 days ago and was doing okay until this morning when she noted onset of substernal chest pressure without radiation. There is no shortness of breath or nausea vomiting or diaphoresis. Patient is not entirely clear of this history however. Daughters are at bedside and they relate some of what they heard at the time.  Patient states that this pain is different from the pain that she had when she had her heart attack last fall. She notes that this one stays in the middle of her chest and does not go anywhere. Patient does admit to some increased dyspnea on exertion from her baseline although admits that she is not very active at baseline. She notes that she is on hemodialysis Monday Wednesday and Friday but was last dialyzed on Thursday.  Patient denies any bleeding as far as she is aware. Notes that her stool is dark brown and that has been a stable color. Denies hematemesis, vomiting, bright red blood per rectum, melena, hemoptysis. Of note patient did undergo graft revision 2 days ago.   ED Course:  The patient was noted to have had a decrease in her hematocrit nd noted to have an elevated troponin with ongoing active chest pain and heart rate ranging from 90-120. She was treated with iv fentanyl and nitroglycerin without good effect.   ROS:   ROS   As per history of present illness  Past Medical History:   Past  Medical History:  Diagnosis Date  . Anemia    History  of Blood transfusion  . Asthma    PMH  . Atrial fibrillation with RVR ([place]) 11/2016  . CAD (coronary artery disease)    stent to RCA  . Cancer ([place])    clear cell cancer, kidney  . Chronic renal insufficiency    On hemodialysis  . Complication of anesthesia 12/2010   pt is very confused, with AMS with anesthesia  . CVA 07/27/2008   CVA affected cognition and memory per family, no focal deficits.    . CVA (cerebral infarction) 2003   no apparent residual  . Diastolic congestive heart failure ([place])   . Dyslipidemia   . Encephalopathy   . ESRD 07/27/2008   ESRD due to HTN and NSAID's, started hemodialysis in 2005 in [place], [place]. Went to [facility] from 2010 to 2012 and since 2012 has been getting dialysis at [facility] on [address] in [place] on a MWF schedule. First access with RUA AVG placed in [place]. Next and current access was LUA AVG placed by Dr. Lucky Cowboy in [place] in or around 2012. Has had 2 or 3 procedures on that graft since placed per family. She gets her access work done here in [place] now. She is allergic to heparin and does not get any heparin at dialysis; she had an allergic reaction apparently when in ICU in the past.      . GERD (gastroesophageal reflux disease)   . Hyperlipidemia   .  Hypertension   . Hypothyroidism   . Myocardial infarction ([place])    per pt  . Positive PPD    completed rifampin  . Pulmonary hypertension ([place])   . PVD (peripheral vascular disease) ([place])   . Seizures ([place])    last seizure 6 years ago  . Sleep apnea    Sleep Study 2008  . Traumatic seroma of left lower leg ([place])   . Wears glasses   . Wears partial dentures     Past Surgical History:   Past Surgical History:  Procedure Laterality Date  . ABDOMINAL AORTOGRAM W/LOWER EXTREMITY Bilateral 06/15/2016   Procedure: Abdominal Aortogram w/Lower Extremity;  Surgeon: Conrad [place], MD;  Location: [facility];  Service: Cardiovascular;  Laterality: Bilateral;  . ABDOMINAL HYSTERECTOMY    . APPENDECTOMY    . APPLICATION OF WOUND VAC Left 09/12/2016   Procedure: APPLICATION OF WOUND VAC;  Surgeon: Conrad Plymouth, MD;  Location: [facility];  Service: Vascular;  Laterality: Left;  . ARTERY EXPLORATION Left 07/25/2016   Procedure: ARTERY EXPLORATION LEFT ABOVE KNEE POPLITEAL AND GROIN;  Surgeon: Conrad [place], MD;  Location: [facility];  Service: Vascular;  Laterality: Left;  . AV FISTULA PLACEMENT Left 04/10/2017   Procedure: INSERTION OF ARTERIOVENOUS (AV) GORE-TEX GRAFT REDO LEFT UPPER ARM;  Surgeon: Conrad Purdy, MD;  Location: [facility];  Service: Vascular;  Laterality: Left;  . CARDIAC CATHETERIZATION     last 2016  . CHOLECYSTECTOMY    . COLONOSCOPY    . D&Cs    . HEMATOMA EVACUATION Left 09/12/2016   Procedure: EVACUATION HEMATOMA;  Surgeon: Conrad Allen, MD;  Location: [facility];  Service: Vascular;  Laterality: Left;  . INSERTION OF DIALYSIS CATHETER Right 04/10/2017   Procedure: INSERTION OF DIALYSIS CATHETER;  Surgeon: Conrad Parmer, MD;  Location: [facility];  Service: Vascular;  Laterality: Right;  . IR RADIOLOGIST EVAL & MGMT  03/13/2017  . LEFT HEART CATH AND CORONARY ANGIOGRAPHY N/A 11/22/2016   Procedure: LEFT HEART CATH AND CORONARY ANGIOGRAPHY;  Surgeon: Belva Crome, MD;  Location: [facility];  Service: Cardiovascular;  Laterality: N/A;  . LEFT HEART CATHETERIZATION WITH CORONARY ANGIOGRAM N/A 02/22/2011   Procedure: LEFT HEART CATHETERIZATION WITH CORONARY ANGIOGRAM;  Surgeon: Wellington Hampshire, MD;  Location: [facility];  Service: Cardiovascular;  Laterality: N/A;  . left nephrectomy    . MULTIPLE TOOTH EXTRACTIONS    . NEPHRECTOMY Left    Malignant tumor  . PERIPHERAL VASCULAR CATHETERIZATION N/A 12/31/2014   Procedure: Abdominal Aortogram;  Surgeon: Conrad [place], MD;  Location: [facility];  Service: Cardiovascular;  Laterality: N/A;  . right ankle repair    . RIGHT HEART  CATHETERIZATION N/A 01/29/2014   Procedure: RIGHT HEART CATH;  Surgeon: Larey Dresser, MD;  Location: [facility];  Service: Cardiovascular;  Laterality: N/A;  . TONSILLECTOMY    . TUBAL LIGATION    . ULTRASOUND GUIDANCE FOR VASCULAR ACCESS  11/22/2016   Procedure: Ultrasound Guidance For Vascular Access;  Surgeon: Belva Crome, MD;  Location: [facility];  Service: Cardiovascular;;  . UPPER EXTREMITY VENOGRAPHY Bilateral 03/22/2017   Procedure: UPPER EXTREMITY VENOGRAPHY;  Surgeon: Conrad Mitchell, MD;  Location: [facility];  Service: Cardiovascular;  Laterality: Bilateral;  bilateral  . VASCULAR SURGERY  11/2010   graft inserted to left arm    Social History:   Social History   Socioeconomic History  .  Marital status: Divorced    Spouse name: Not on file  . Number of children: 2  . Years of education: 54  . Highest education level: Not on file  Occupational History  . Occupation: Retired Optician, dispensing: RETIRED  Social Needs  . Financial resource strain: Not on file  . Food insecurity:    Worry: Not on file    Inability: Not on file  . Transportation needs:    Medical: Not on file    Non-medical: Not on file  Tobacco Use  . Smoking status: Former Smoker    Packs/day: 0.00    Years: 53.00    Pack years: 0.00    Types: Cigarettes  . Smokeless tobacco: Never Used  . Tobacco comment: quit smoking cigarettes in January 2019  Substance and Sexual Activity  . Alcohol use: Yes    Alcohol/week: 0.0 oz    Comment: very rarely per pt  . Drug use: No    Comment: former marijuana use, several years  . Sexual activity: Not Currently    Birth control/protection: Post-menopausal  Lifestyle  . Physical activity:    Days per week: Not on file    Minutes per session: Not on file  . Stress: Not on file  Relationships  . Social connections:    Talks on phone: Not on file    Gets together: Not on file    Attends religious service: Not on file    Active member  of club or organization: Not on file    Attends meetings of clubs or organizations: Not on file    Relationship status: Not on file  . Intimate partner violence:    Fear of current or ex partner: Not on file    Emotionally abused: Not on file    Physically abused: Not on file    Forced sexual activity: Not on file  Other Topics Concern  . Not on file  Social History Narrative  . Not on file    Allergies   Ace inhibitors; Heparin; Iohexol; Phenytoin; Wellbutrin [bupropion]; Meperidine hcl; Morphine; Penicillins; Valproic acid and related; Iodinated diagnostic agents; and Pentazocine lactate  Family history:   Family History  Problem Relation Age of Onset  . Heart disease Father   . Hypertension Mother   . Dementia Mother   . Coronary artery disease Sister   . Heart attack Sister   . Hypertension Brother     Current Medications:   Prior to Admission medications   Medication Sig Start Date End Date Taking? Authorizing Provider  acetaminophen (TYLENOL) 500 MG tablet Take 1,000 mg by mouth daily as needed for mild pain.    Yes [provider]  albuterol (PROVENTIL HFA;VENTOLIN HFA) 108 (90 Base) MCG/ACT inhaler Inhale 1-2 puffs into the lungs every 6 (six) hours as needed for wheezing or shortness of breath. 01/05/17  Yes Marney Setting, NP  carvedilol (COREG) 25 MG tablet Take 25 mg by mouth 2 (two) times daily with a meal.   Yes [provider]  cilostazol (PLETAL) 100 MG tablet Take 1 tablet (100 mg total) by mouth 2 (two) times daily before a meal. 05/24/16  Yes Conrad [place], MD  diltiazem (CARDIZEM CD) 120 MG 24 hr capsule Take 1 capsule (120 mg total) by mouth daily. 03/01/17  Yes Minus Breeding, MD  diphenhydrAMINE (BENADRYL) 50 MG capsule Take 1 capsule (50 mg total) by mouth every Monday, Wednesday, and Friday. On dialysis days 09/15/16  Yes Rhyne, Aldona Bar  J, PA-C  enoxaparin (LOVENOX) 80 MG/0.8ML injection Inject 0.8 mLs (80 mg total) into the skin  daily. 04/03/17  Yes Minus Breeding, MD  folic acid-vitamin b complex-vitamin c-selenium-zinc (DIALYVITE) 3 MG TABS tablet Take 1 tablet by mouth daily.   Yes [provider]  hydrALAZINE (APRESOLINE) 25 MG tablet Take 25 mg by mouth 3 (three) times daily.    Yes [provider]  HYDROcodone-acetaminophen (NORCO) 5-325 MG tablet Take 1 tablet by mouth every 6 (six) hours as needed for moderate pain. 04/10/17  Yes Dagoberto Ligas, PA-C  isosorbide dinitrate (ISORDIL) 20 MG tablet Take 1 tablet (20 mg total) by mouth 3 (three) times daily. 10/26/16  Yes Hochrein, Jeneen Rinks, MD  LETAIRIS 5 MG tablet TAKE 1 TABLET (5MG ) BY MOUTH DAILY. DO NOT HANDLE IF PREGNANT. DO NOTSPLIT, CRUSH, OR CHEW. AVOID INHALATION AND CONTACT WITH SKIN OR EYES. 09/18/16  Yes Juanito Doom, MD  levETIRAcetam (KEPPRA) 500 MG tablet Take 500 mg by mouth daily at 8 pm.   Yes [provider]  levothyroxine (SYNTHROID, LEVOTHROID) 150 MCG tablet Take 150 mcg by mouth daily before breakfast.   Yes [provider]  nitroGLYCERIN (NITROSTAT) 0.4 MG SL tablet Place 1 tablet (0.4 mg total) under the tongue every 5 (five) minutes as needed for chest pain. 10/26/16  Yes Minus Breeding, MD  omeprazole (PRILOSEC OTC) 20 MG tablet Take 20 mg by mouth daily.   Yes [provider]  pravastatin (PRAVACHOL) 40 MG tablet Take 1 tablet (40 mg total) by mouth at bedtime. 05/23/16  Yes Minus Breeding, MD  RENVELA 800 MG tablet Take 1,600 mg by mouth 3 (three) times daily with meals. May take 1600 mg with each snack 03/08/17  Yes [provider]  SENSIPAR 90 MG tablet Take 90 mg by mouth every Monday, Wednesday, and Friday with hemodialysis.  03/05/14  Yes [provider]  traZODone (DESYREL) 50 MG tablet Take 50 mg by mouth at bedtime as needed for sleep.    Yes [provider]  warfarin (COUMADIN) 7.5 MG tablet Take 1/2 to 1 tablet daily as directed by coumadin clinic 04/05/17  Yes  Minus Breeding, MD    Physical Exam:   Vitals:   04/14/17 1414 04/14/17 1419 04/14/17 1503 04/14/17 1602  BP: 118/77  117/66 127/77  Pulse: (!) 106  98 (!) 118  Resp: 20  13 16   Temp: 98.8 F (37.1 C)     TempSrc: Oral     SpO2: 99%  94% 97%  Weight:  75.3 kg (166 lb)    Height:  5' 2.5" (1.588 m)       Physical Exam: Blood pressure 127/77, pulse (!) 118, temperature 98.8 F (37.1 C), temperature source Oral, resp. rate 16, height 5' 2.5" (1.588 m), weight 75.3 kg (166 lb), SpO2 97 %. Gen: elderly patient appearing older than stated age sitting up in bed speaking with her daughters in no apparent respiratory distress Eyes: Sclerae anicteric. Conjunctiva mildly injected. Chest: decreased air entry bilaterally with rales at bases to a quarter of the way up bilaterally. CV: Distant, regular, systolic murmur without radiation. Abdomen: NABS, soft, nondistended, nontender. No tenderness to light or deep palpation. Vas cath does appear infected. Extremities: No edema.  Neuro: Alert and oriented times 3; grossly nonfocal. Clearly has memory deficits.   Data Review:    Labs: Basic Metabolic Panel: Recent Labs  Lab 04/10/17 0638 04/14/17 1427  NA 135 135  K 4.2 4.1  CL  --  92*  CO2  --  30  GLUCOSE 84 104*  BUN  --  34*  CREATININE  --  6.81*  CALCIUM  --  8.8*   Liver Function Tests: No results for input(s): AST, ALT, ALKPHOS, BILITOT, PROT, ALBUMIN in the last 168 hours. No results for input(s): LIPASE, AMYLASE in the last 168 hours. No results for input(s): AMMONIA in the last 168 hours. CBC: Recent Labs  Lab 04/10/17 0638 04/14/17 1427  WBC  --  4.5  HGB 9.2* 7.6*  HCT 27.0* 23.8*  MCV  --  81.8  PLT  --  138*   Cardiac Enzymes: No results for input(s): CKTOTAL, CKMB, CKMBINDEX, TROPONINI in the last 168 hours.  BNP (last 3 results) No results for input(s): PROBNP in the last 8760 hours. CBG: No results for input(s): GLUCAP in the last 168  hours.  Urinalysis    Component Value Date/Time   COLORURINE YELLOW 09/03/2014 0018   APPEARANCEUR CLOUDY (A) 09/03/2014 0018   LABSPEC 1.010 09/03/2014 0018   PHURINE 5.5 09/03/2014 0018   GLUCOSEU 250 (A) 09/03/2014 0018   HGBUR TRACE (A) 09/03/2014 0018   BILIRUBINUR NEGATIVE 09/03/2014 0018   KETONESUR NEGATIVE 09/03/2014 0018   PROTEINUR 100 (A) 09/03/2014 0018   UROBILINOGEN 0.2 09/03/2014 0018   NITRITE NEGATIVE 09/03/2014 0018   LEUKOCYTESUR NEGATIVE 09/03/2014 0018      Radiographic Studies: Dg Chest 2 View  Result Date: 04/14/2017 CLINICAL DATA:  Chest pain today EXAM: CHEST - 2 VIEW COMPARISON:  Chest x-rays dated 04/10/2017 and 01/05/2017. FINDINGS: Stable cardiomegaly. Aortic atherosclerosis. Central catheter from femoral pro ches stable in position with tip at the expected level of the cavoatrial junction. Diffuse interstitial prominence is stable compared to recent exam, increased compared to an earlier chest x-ray of 10/06/2013. no confluent opacity to suggest a consolidating pneumonia. No pleural effusion or pneumothorax seen. No acute or suspicious osseous finding. IMPRESSION: 1. Cardiomegaly with persistent interstitial prominence, presumed interstitial edema, compatible with chronic mild CHF. No evidence of overt alveolar pulmonary edema. No evidence of pneumonia. 2. Aortic atherosclerosis. Electronically Signed   By: Franki Cabot M.D.   On: 04/14/2017 14:59    EKG: Independently reviewed. Sinus rhythm at 120. She has Q's in II, III, and F as well as V1 through V4 with extremely poor to no R-wave progression. I do not see any acute ST depressions or T-wave and versions concerning for acute focal ischemia.   Assessment/Plan:   Active Problems:   NSTEMI (non-ST elevated myocardial infarction) ([place])   NSTEMI EKG without any acute changes. However patient does have an elevated troponin of 0.26 with ongoing chest pain. Of note patient has had a decrease in her  usual H and H and she is in pulmonary edema by chest x-ray and physical exam. Patient has already received aspirin at home. I will start heparin on her now. I will give her metoprolol 2.5 mg IV 1 now. Patient is to undergo hemodialysis right now with transfusion of 2 units packed cells as well as removal of fluid to alleviate pulmonary edema. This should certainly help with decreasing myocardial workload and increasing oxygen delivery.   AFIB WITH RVR Heart rate is ranging from 90-120 while I was in the room.She shows blood pressure has decreased after treatment with the nitroglycerin. She states she did take her Cardizem and carvedilol this morning. I will provide metoprolol 2.5 mg IV 1 nowwith hopes of decreasing her heart rate and decreasing myocardial oxygen demand.  Patient is going to undergo dialysis right now but may need a Cardizem drip after dialysis if heart rate is not well controlled.  Patient is being placed on heparin drip. Can resume warfarin if warranted and stool guaiac are negative  ANEMIA Patient with new anemia which may be secondary to her graft revision to days ago. Of note however is that her hemoglobin has been steadily declining over the past 1-2 months. Will send anemia labs and send stool for guaiacs especially as patient is being anticoagulated. Blood transfusion during HD as noted above.  INFECTED VASCATH Patient has an infected Vas-Cath. Discussed with renal who will use that catheter to dialyze tonight however catheter will need to be removed by vascular tomorrow. Will start vancomycin and ceftazidine per pharmacy dosing after dialysis today.  CHF  Patient with known systolic and diastolic heart failure. Hemodialysis for fluid removal of pulmonary edema.  HTN Continue carvedilol and diltiazem Hold hydralazine as I would like to maximize beta blocker usage given CHF and ACS.  Continue daily aspirin.  ESRD Renal actively consult for hemodialysis today as  discussed above.   SEIZURE DISORDER  Continue keppra   PULMONARY HTN Continue letairis   Other information:   DVT prophylaxis: Lovenox ordered. Code Status: Full code. Family Communication: patient's 2 daughters were at bedside  Disposition Plan: home Consults called: renal Dr. Jonnie Finner Admission status: inpatient   The medical decision making on this patient was of high complexity and the patient is at high risk for clinical deterioration, therefore this is a level 3 visit.   Dewaine Oats Tublu Alliana Mcauliff [facility]  If 7PM-7AM, please contact night-coverage [email or web address] Password [facility] 04/14/2017, 4:32 PM

## 2017-04-14 NOTE — ED Notes (Signed)
EMT currently drawing labs 

## 2017-04-14 NOTE — Progress Notes (Signed)
Paged [facility]. HR 120s afib. Notified [facility] that ER MD wanted patient started on Cardizem drip and heparin drip.

## 2017-04-14 NOTE — ED Notes (Signed)
Renal/ carb modified/ fluid restriction 1200 mL diet dinner tray ordered

## 2017-04-14 NOTE — ED Triage Notes (Addendum)
Per EMS, pt from home with complaint of Central chest pressure no radiation that began around 1100. Denies n/v or shob or dizziness. In a-fib 100-120 and has hx of same. Pt took 1 nitro and given 1 nitro by ems with 324 asa without relief. BP 124/76, RR 18, spo2 96% on RA. Pt is on dialysis and last full treatment was Thursday, normally goes mon-wed-fri but went a day early Thursday due to a funeral.

## 2017-04-14 NOTE — Progress Notes (Signed)
HD tx completed '@2250'  w/o problem, 2 units of PRBC admin w/o s/s of reaction,UF goal met, blood rinsed back,  VSS, report called to Abigail Miyamoto, RN

## 2017-04-14 NOTE — Progress Notes (Signed)
HD tx initiated via HD cath w/o problem, pull/push/flush equally w/o problem, however was unable to reach prescribed BFR d/t increased AP. Did not change HD dsg as ED nurse had already changed prior to arrival d/t increased drainage from site/dsg, at this time the  dsg itself is CDI however the hypofix tape around the ports is very wet from the previous drainage. UTA insertion site d/t fresh gauze dsg in place. BC x2 obtained prior to placing on tx. Midodrine 10 mg po and Alubmin 25% 25G IVPB was admin at start of tx to help support bp/fluid removal during HD tx as pt had gotten Metoprolol in ED for HR and bp had become soft. Will cont to monitor while on HD tx

## 2017-04-14 NOTE — Consult Note (Signed)
[name] 04/14/2017 [place] Requesting Physician:  Dr Jamse Arn  Reason for Consult:  ERSD pt with CP HPI: The patient is a 72 y.o. year-old with hx of afib, HTN, CAD/MI/ RCA stent, ESRD on HD MWF, seizures, OSA.  On HD 11 yrs.  Presented to ED today with CP, afib /RVR w/ HR 120's.  CXR showed mild CHF/ edema.  Hb 7.6.  Pt had new access placed in L upper arm (alongside a failed old access) by Dr Bridgett Larsson 4 days ago on Tuesday, and has had some swelling and pain postop.  Asked to see for ESRD.    Denies sig SOB, cough, fevers, no abd pain or n/v/d.  No chills or sweats.  Daughter at bedside.  Pt had HD 3 times this wk already, on Monday, Wed and Thursday, due to a funeral yesterday on Friday.  Denies excess po intake of fluids.        ROS  no joint pain   no HA  no blurry vision  no rash  no diarrhea  no nausea/ vomiting   Past Medical History  Past Medical History:  Diagnosis Date  . Anemia    History  of Blood transfusion  . Asthma    PMH  . Atrial fibrillation with RVR ([place]) 11/2016  . CAD (coronary artery disease)    stent to RCA  . Cancer ([place])    clear cell cancer, kidney  . Chronic Sarah insufficiency    On hemodialysis  . Complication of anesthesia 12/2010   pt is very confused, with AMS with anesthesia  . CVA 07/27/2008   CVA affected cognition and memory per family, no focal deficits.    . CVA (cerebral infarction) 2003   no apparent residual  . Diastolic congestive heart failure ([place])   . Dyslipidemia   . Encephalopathy   . ESRD 07/27/2008   ESRD due to HTN and NSAID's, started hemodialysis in 2005 in [place], [place]. Went to [facility] from 2010 to 2012 and since 2012 has been getting dialysis at [facility] on [address] in [place] on a MWF schedule. First access with RUA AVG placed in [place]. Next and current access was LUA AVG placed by Dr. Lucky Cowboy in [place] in or around  2012. Has had 2 or 3 procedures on that graft since placed per family. She gets her access work done here in [place] now. She is allergic to heparin and does not get any heparin at dialysis; she had an allergic reaction apparently when in ICU in the past.      . GERD (gastroesophageal reflux disease)   . Hyperlipidemia   . Hypertension   . Hypothyroidism   . Myocardial infarction ([place])    per pt  . Positive PPD    completed rifampin  . Pulmonary hypertension ([place])   . PVD (peripheral vascular disease) ([place])   . Seizures ([place])    last seizure 6 years ago  . Sleep apnea    Sleep Study 2008  . Traumatic seroma of left lower leg ([place])   . Wears glasses   . Wears partial dentures    Past Surgical History  Past Surgical History:  Procedure Laterality Date  . ABDOMINAL AORTOGRAM W/LOWER EXTREMITY Bilateral 06/15/2016   Procedure: Abdominal Aortogram w/Lower Extremity;  Surgeon: Conrad Burleson, MD;  Location: [place] [facility];  Service: Cardiovascular;  Laterality: Bilateral;  . ABDOMINAL HYSTERECTOMY    . APPENDECTOMY    .  APPLICATION OF WOUND VAC Left 09/12/2016   Procedure: APPLICATION OF WOUND VAC;  Surgeon: Conrad Harvest, MD;  Location: [facility];  Service: Vascular;  Laterality: Left;  . ARTERY EXPLORATION Left 07/25/2016   Procedure: ARTERY EXPLORATION LEFT ABOVE KNEE POPLITEAL AND GROIN;  Surgeon: Conrad Canastota, MD;  Location: [facility];  Service: Vascular;  Laterality: Left;  . AV FISTULA PLACEMENT Left 04/10/2017   Procedure: INSERTION OF ARTERIOVENOUS (AV) GORE-TEX GRAFT REDO LEFT UPPER ARM;  Surgeon: Conrad Sand Rock, MD;  Location: [facility];  Service: Vascular;  Laterality: Left;  . CARDIAC CATHETERIZATION     last 2016  . CHOLECYSTECTOMY    . COLONOSCOPY    . D&Cs    . HEMATOMA EVACUATION Left 09/12/2016   Procedure: EVACUATION HEMATOMA;  Surgeon: Conrad Springer, MD;  Location: [facility];  Service: Vascular;  Laterality: Left;  . INSERTION OF DIALYSIS CATHETER Right 04/10/2017   Procedure:  INSERTION OF DIALYSIS CATHETER;  Surgeon: Conrad Hill, MD;  Location: [facility] OR;  Service: Vascular;  Laterality: Right;  . IR RADIOLOGIST EVAL & MGMT  03/13/2017  . LEFT HEART CATH AND CORONARY ANGIOGRAPHY N/A 11/22/2016   Procedure: LEFT HEART CATH AND CORONARY ANGIOGRAPHY;  Surgeon: Belva Crome, MD;  Location: [facility] [facility];  Service: Cardiovascular;  Laterality: N/A;  . LEFT HEART CATHETERIZATION WITH CORONARY ANGIOGRAM N/A 02/22/2011   Procedure: LEFT HEART CATHETERIZATION WITH CORONARY ANGIOGRAM;  Surgeon: Wellington Hampshire, MD;  Location: [facility] CATH LAB;  Service: Cardiovascular;  Laterality: N/A;  . left nephrectomy    . MULTIPLE TOOTH EXTRACTIONS    . NEPHRECTOMY Left    Malignant tumor  . PERIPHERAL VASCULAR CATHETERIZATION N/A 12/31/2014   Procedure: Abdominal Aortogram;  Surgeon: Conrad Jakes Corner, MD;  Location: [place] [facility];  Service: Cardiovascular;  Laterality: N/A;  . right ankle repair    . RIGHT HEART CATHETERIZATION N/A 01/29/2014   Procedure: RIGHT HEART CATH;  Surgeon: Larey Dresser, MD;  Location: [facility];  Service: Cardiovascular;  Laterality: N/A;  . TONSILLECTOMY    . TUBAL LIGATION    . ULTRASOUND GUIDANCE FOR VASCULAR ACCESS  11/22/2016   Procedure: Ultrasound Guidance For Vascular Access;  Surgeon: Belva Crome, MD;  Location: [facility];  Service: Cardiovascular;;  . UPPER EXTREMITY VENOGRAPHY Bilateral 03/22/2017   Procedure: UPPER EXTREMITY VENOGRAPHY;  Surgeon: Conrad , MD;  Location: [facility];  Service: Cardiovascular;  Laterality: Bilateral;  bilateral  . VASCULAR SURGERY  11/2010   graft inserted to left arm   Family History  Family History  Problem Relation Age of Onset  . Heart disease Father   . Hypertension Mother   . Dementia Mother   . Coronary artery disease Sister   . Heart attack Sister   . Hypertension Brother    Social History  reports that she has quit smoking. Her smoking use included cigarettes. She  smoked 0.00 packs per day for 53.00 years. She has never used smokeless tobacco. She reports that she drinks alcohol. She reports that she does not use drugs. Allergies  Allergies  Allergen Reactions  . Ace Inhibitors Anaphylaxis and Rash  . Heparin Other (See Comments)    MDs told her not to take after reaction in ICU  . Iohexol Swelling and Other (See Comments)    1970s; passed out and had facial/tongue swelling.  Requires 13-hour prep with prednisone and benadryl  . Phenytoin Other (See Comments)  Had reaction while in ICU; doesn't know.  MDs told her not to take ever again.  . Wellbutrin [Bupropion] Other (See Comments)    seizures  . Meperidine Hcl Swelling and Rash    Makes tongue swell  . Morphine Rash  . Penicillins Hives and Rash    Has patient had a PCN reaction causing immediate rash, facial/tongue/throat swelling, SOB or lightheadedness with hypotension: Yes Has patient had a PCN reaction causing severe rash involving mucus membranes or skin necrosis: No Has patient had a PCN reaction that required hospitalization: No Has patient had a PCN reaction occurring within the last 10 years: No If all of the above answers are "NO", then may proceed with Cephalosporin use.    . Valproic Acid And Related Other (See Comments)    Confusion   . Iodinated Diagnostic Agents Other (See Comments)    Pre-meditate with benadryl and prednisone 3 times before appt.  . Pentazocine Lactate Other (See Comments)    Patient does not remember reaction to this med ([place]win).    Home medications Prior to Admission medications   Medication Sig Start Date End Date Taking? Authorizing Provider  acetaminophen (TYLENOL) 500 MG tablet Take 1,000 mg by mouth daily as needed for mild pain.    Yes [provider]  albuterol (PROVENTIL HFA;VENTOLIN HFA) 108 (90 Base) MCG/ACT inhaler Inhale 1-2 puffs into the lungs every 6 (six) hours as needed for wheezing or shortness of breath. 01/05/17  Yes  Marney Setting, NP  carvedilol (COREG) 25 MG tablet Take 25 mg by mouth 2 (two) times daily with a meal.   Yes [provider]  cilostazol (PLETAL) 100 MG tablet Take 1 tablet (100 mg total) by mouth 2 (two) times daily before a meal. 05/24/16  Yes Conrad Shawneeland, MD  diltiazem (CARDIZEM CD) 120 MG 24 hr capsule Take 1 capsule (120 mg total) by mouth daily. 03/01/17  Yes Minus Breeding, MD  diphenhydrAMINE (BENADRYL) 50 MG capsule Take 1 capsule (50 mg total) by mouth every Monday, Wednesday, and Friday. On dialysis days 09/15/16  Yes Rhyne, Samantha J, PA-C  enoxaparin (LOVENOX) 80 MG/0.8ML injection Inject 0.8 mLs (80 mg total) into the skin daily. 04/03/17  Yes Minus Breeding, MD  folic acid-vitamin b complex-vitamin c-selenium-zinc (DIALYVITE) 3 MG TABS tablet Take 1 tablet by mouth daily.   Yes [provider]  hydrALAZINE (APRESOLINE) 25 MG tablet Take 25 mg by mouth 3 (three) times daily.    Yes [provider]  HYDROcodone-acetaminophen (NORCO) 5-325 MG tablet Take 1 tablet by mouth every 6 (six) hours as needed for moderate pain. 04/10/17  Yes Dagoberto Ligas, PA-C  isosorbide dinitrate (ISORDIL) 20 MG tablet Take 1 tablet (20 mg total) by mouth 3 (three) times daily. 10/26/16  Yes Hochrein, Jeneen Rinks, MD  LETAIRIS 5 MG tablet TAKE 1 TABLET (5MG ) BY MOUTH DAILY. DO NOT HANDLE IF PREGNANT. DO NOTSPLIT, CRUSH, OR CHEW. AVOID INHALATION AND CONTACT WITH SKIN OR EYES. 09/18/16  Yes Juanito Doom, MD  levETIRAcetam (KEPPRA) 500 MG tablet Take 500 mg by mouth daily at 8 pm.   Yes [provider]  levothyroxine (SYNTHROID, LEVOTHROID) 150 MCG tablet Take 150 mcg by mouth daily before breakfast.   Yes [provider]  nitroGLYCERIN (NITROSTAT) 0.4 MG SL tablet Place 1 tablet (0.4 mg total) under the tongue every 5 (five) minutes as needed for chest pain. 10/26/16  Yes Minus Breeding, MD  omeprazole (PRILOSEC OTC) 20 MG tablet Take 20  mg by mouth daily.    Yes [provider]  pravastatin (PRAVACHOL) 40 MG tablet Take 1 tablet (40 mg total) by mouth at bedtime. 05/23/16  Yes Minus Breeding, MD  RENVELA 800 MG tablet Take 1,600 mg by mouth 3 (three) times daily with meals. May take 1600 mg with each snack 03/08/17  Yes [provider]  SENSIPAR 90 MG tablet Take 90 mg by mouth every Monday, Wednesday, and Friday with hemodialysis.  03/05/14  Yes [provider]  traZODone (DESYREL) 50 MG tablet Take 50 mg by mouth at bedtime as needed for sleep.    Yes [provider]  warfarin (COUMADIN) 7.5 MG tablet Take 1/2 to 1 tablet daily as directed by coumadin clinic 04/05/17  Yes Minus Breeding, MD   Liver Function Tests No results for input(s): AST, ALT, ALKPHOS, BILITOT, PROT, ALBUMIN in the last 168 hours. No results for input(s): LIPASE, AMYLASE in the last 168 hours. CBC Recent Labs  Lab 04/10/17 0638 04/14/17 1427  WBC  --  4.5  HGB 9.2* 7.6*  HCT 27.0* 23.8*  MCV  --  81.8  PLT  --  532*   Basic Metabolic Panel Recent Labs  Lab 04/10/17 0638 04/14/17 1427  NA 135 135  K 4.2 4.1  CL  --  92*  CO2  --  30  GLUCOSE 84 104*  BUN  --  34*  CREATININE  --  6.81*  CALCIUM  --  8.8*   Iron/TIBC/Ferritin/ %Sat    Component Value Date/Time   IRON 56 09/01/2009 1711   TIBC 266 09/01/2009 1711   FERRITIN 1189 (H) 09/01/2009 1711   IRONPCTSAT 21 09/01/2009 1711    Vitals:   04/14/17 1602 04/14/17 1615 04/14/17 1630 04/14/17 1646  BP: 127/77 120/70 101/80 103/68  Pulse: (!) 118 (!) 125 (!) 119 (!) 124  Resp: 16 12 16 13   Temp:      TempSrc:      SpO2: 97% 95% 93% 98%  Weight:      Height:       Exam Gen no distress, l yling flat, pleasant No rash, cyanosis or gangrene Sclera anicteric, throat clear  +JVD, no LAN Chest bibasilar Rales 1/3 up Cor irreg irreg tachy, 2/6 sem Abd soft ntnd no mass or ascites +bs GU defer MS no joint effusions or deformity Ext no LE edema, no wounds or  ulcers Neuro is alert, Ox 3 , nf LUA AVG +bruit, +LUE edema/ slight warmth to touch, no erythema R thigh TDC has sig drainage slightly cloudy serous liquid draining, soaked the dressing  Home meds: - coreg 25 bid/ dilt 120 qd/ hydral 25 tid - sl ntg prn/ isordil 20 tid - PPI/ sensipar 90/ desyrel/ T4/ norco prn/ pletal  - coumadin/ lovenox SQ - keppra   Dialysis: MWF records pending   Impression: 1  Chest pain / +troponin - EKG shows old ant infarct, +pulm edema on CXR (mild) 2  Pulm edema - on exam/ CXR, may be simple vol excess vs due to MI/ anemia/ ischemia/ afib w RVR.  Has known CAD.  3  CAD - known CAD w/ RCA stent, 50% L main, LAD open, 80% LCX last cath Nov 18.   4  CM - LVEF known to be low, about 30% 5  R thigh cath drainage - suspect cath infection, will culture and have d/w PMD to start IV vanc/fortaz pending cx results. May need cath removal tomorrow.  6  HTN -  bp's soft, hold bp meds 7  Afib / RVR - per primary, prn low dose MTP 2.5 IV. Will be on IV hep per primary 8  Seizure d/o - on Keppra 9  SP new L upper arm AVG - on 4/9 by Dr Bridgett Larsson, on exam looks OK , +bruit, normal swelling for 4d postop, no intervention needed 10  Anemia ckd/ abl - had recent AVG surgery which could have caused the Hb drop.       Plan - will plan HD tonight, get some vol off and transfuse 2u prbc, use the TDC in the leg.  Cx drainage, start IV abx, get blood cx's too, may need TDC removed soon.   Kelly Splinter MD [employer] pager [phone]   04/14/2017, 5:12 PM

## 2017-04-14 NOTE — ED Notes (Signed)
Attempted report to floor.  

## 2017-04-14 NOTE — Procedures (Signed)
   I was present at this dialysis session, have reviewed the session itself and made  appropriate changes Kelly Splinter MD [place] pager [phone]   04/14/2017, 7:52 PM

## 2017-04-14 NOTE — Progress Notes (Signed)
ANTICOAGULATION CONSULT NOTE - Initial Consult  Pharmacy Consult for heparin and vancomycin and fortaz Indication: chest pain/ACS & HD cathter infection  Allergies  Allergen Reactions  . Ace Inhibitors Anaphylaxis and Rash  . Heparin Other (See Comments)    MDs told her not to take after reaction in ICU  . Iohexol Swelling and Other (See Comments)    1970s; passed out and had facial/tongue swelling.  Requires 13-hour prep with prednisone and benadryl  . Phenytoin Other (See Comments)    Had reaction while in ICU; doesn't know.  MDs told her not to take ever again.  . Wellbutrin [Bupropion] Other (See Comments)    seizures  . Meperidine Hcl Swelling and Rash    Makes tongue swell  . Morphine Rash  . Penicillins Hives and Rash    Has patient had a PCN reaction causing immediate rash, facial/tongue/throat swelling, SOB or lightheadedness with hypotension: Yes Has patient had a PCN reaction causing severe rash involving mucus membranes or skin necrosis: No Has patient had a PCN reaction that required hospitalization: No Has patient had a PCN reaction occurring within the last 10 years: No If all of the above answers are "NO", then may proceed with Cephalosporin use.    . Valproic Acid And Related Other (See Comments)    Confusion   . Iodinated Diagnostic Agents Other (See Comments)    Pre-meditate with benadryl and prednisone 3 times before appt.  . Pentazocine Lactate Other (See Comments)    Patient does not remember reaction to this med ([place]).     Patient Measurements: Height: 5' 2.5" (158.8 cm) Weight: 166 lb (75.3 kg) IBW/kg (Calculated) : 51.25 Heparin Dosing Weight: 67kg  Vital Signs: Temp: 98.8 F (37.1 C) (04/13 1414) Temp Source: Oral (04/13 1414) BP: 103/68 (04/13 1646) Pulse Rate: 124 (04/13 1646)  Labs: Recent Labs    04/14/17 1427  HGB 7.6*  HCT 23.8*  PLT 138*  LABPROT 18.1*  INR 1.52  CREATININE 6.81*    Estimated Creatinine Clearance: 7.2  mL/min (A) (by C-G formula based on SCr of 6.81 mg/dL (H)).   Medical History: Past Medical History:  Diagnosis Date  . Anemia    History  of Blood transfusion  . Asthma    PMH  . Atrial fibrillation with RVR ([place]) 11/2016  . CAD (coronary artery disease)    stent to RCA  . Cancer ([place])    clear cell cancer, kidney  . Chronic renal insufficiency    On hemodialysis  . Complication of anesthesia 12/2010   pt is very confused, with AMS with anesthesia  . CVA 07/27/2008   CVA affected cognition and memory per family, no focal deficits.    . CVA (cerebral infarction) 2003   no apparent residual  . Diastolic congestive heart failure ([place])   . Dyslipidemia   . Encephalopathy   . ESRD 07/27/2008   ESRD due to HTN and NSAID's, started hemodialysis in 2005 in [place], [place]. Went to [facility] from 2010 to 2012 and since 2012 has been getting dialysis at [facility] on [address] in [place] on a MWF schedule. First access with RUA AVG placed in [place]. Next and current access was LUA AVG placed by Dr. Lucky Cowboy in [place] in or around 2012. Has had 2 or 3 procedures on that graft since placed per family. She gets her access work done here in [place] now. She is allergic to heparin and does not get any heparin at dialysis; she had  an allergic reaction apparently when in ICU in the past.      . GERD (gastroesophageal reflux disease)   . Hyperlipidemia   . Hypertension   . Hypothyroidism   . Myocardial infarction ([place])    per pt  . Positive PPD    completed rifampin  . Pulmonary hypertension ([place])   . PVD (peripheral vascular disease) ([place])   . Seizures ([place])    last seizure 6 years ago  . Sleep apnea    Sleep Study 2008  . Traumatic seroma of left lower leg ([place])   . Wears glasses   . Wears partial dentures     Assessment: 76 YOF with hx of ESRD on HD, admitted with chest pain and possible HD cathter infection. Pharmacy is consulted to start IV heparin, Vancomycin and  fortaz. Plan for HD tonight. Typically MWF HD. Afebrile, wbc 4.5K, plan for HD cath removal tomorrow  Pt is s/p HD cath insertion on 4/9, held coumadin prior to procedure, Per coumadin clinic note on 4/2, restart coumadin on 4/10 with lovenox bridge. lovenox 80 mg daily at night. INR 1.52 on admission.   Noted patient has heparin allergy ("MDs told her not to take after reaction in ICU") cannot tell if it was HIT. Reviewed medication during previous admission, didn't see any heparin charged given since the documented heparin allergy in 2010. Pt was on lovenox prior to this admission. Current hgb 7.6, pltc 138K, has been 130-150s since November. Discussed with Dr. Jamse Arn, will d/c heparin consult and continue lovenox for r/o ACS now.  Also penicillin allergy, but tolerated cefepime before.   Goal of Therapy:  Pre-HD vancomycin level 15-25 Anti-Xa level 0.6-1 units/ml 4hrs after LMWH dose given Monitor platelets by anticoagulation protocol: Yes   Plan:  - Continue lovenox 75 mg sq Q 24hrs - f/u ACS r/o, and plan for restarting coumadin - might need to check anti-Xa level if need to continue lovenox given ESRD - vancomycin loading dose 1500 mg IV x 1 after HD - vancomycin 750 IV post each HD - fortaz 2g Q HD, start after HD tonight - f/u HD schedule and adjust maintenance dose time as needed  Maryanna Shape, PharmD, BCPS  Clinical Pharmacist  Pager: [phone]   04/14/2017,5:58 PM

## 2017-04-15 ENCOUNTER — Other Ambulatory Visit: Payer: Self-pay

## 2017-04-15 LAB — CBC WITH DIFFERENTIAL/PLATELET
BASOS PCT: 0 %
Basophils Absolute: 0 10*3/uL (ref 0.0–0.1)
EOS ABS: 0.1 10*3/uL (ref 0.0–0.7)
Eosinophils Relative: 3 %
HEMATOCRIT: 31.4 % — AB (ref 36.0–46.0)
Hemoglobin: 10.4 g/dL — ABNORMAL LOW (ref 12.0–15.0)
Lymphocytes Relative: 26 %
Lymphs Abs: 1.1 10*3/uL (ref 0.7–4.0)
MCH: 27.7 pg (ref 26.0–34.0)
MCHC: 33.1 g/dL (ref 30.0–36.0)
MCV: 83.5 fL (ref 78.0–100.0)
MONOS PCT: 7 %
Monocytes Absolute: 0.3 10*3/uL (ref 0.1–1.0)
NEUTROS ABS: 2.8 10*3/uL (ref 1.7–7.7)
Neutrophils Relative %: 64 %
Platelets: 122 10*3/uL — ABNORMAL LOW (ref 150–400)
RBC: 3.76 MIL/uL — AB (ref 3.87–5.11)
RDW: 17.8 % — ABNORMAL HIGH (ref 11.5–15.5)
WBC: 4.4 10*3/uL (ref 4.0–10.5)

## 2017-04-15 LAB — BASIC METABOLIC PANEL
Anion gap: 11 (ref 5–15)
BUN: 18 mg/dL (ref 6–20)
CALCIUM: 8.7 mg/dL — AB (ref 8.9–10.3)
CO2: 26 mmol/L (ref 22–32)
CREATININE: 4.56 mg/dL — AB (ref 0.44–1.00)
Chloride: 99 mmol/L — ABNORMAL LOW (ref 101–111)
GFR, EST AFRICAN AMERICAN: 10 mL/min — AB (ref >60)
GFR, EST NON AFRICAN AMERICAN: 9 mL/min — AB (ref >60)
Glucose, Bld: 83 mg/dL (ref 65–99)
Potassium: 4 mmol/L (ref 3.5–5.1)
SODIUM: 136 mmol/L (ref 135–145)

## 2017-04-15 LAB — CBC
HCT: 28.8 % — ABNORMAL LOW (ref 36.0–46.0)
Hemoglobin: 9.4 g/dL — ABNORMAL LOW (ref 12.0–15.0)
MCH: 26.9 pg (ref 26.0–34.0)
MCHC: 32.6 g/dL (ref 30.0–36.0)
MCV: 82.5 fL (ref 78.0–100.0)
Platelets: 120 10*3/uL — ABNORMAL LOW (ref 150–400)
RBC: 3.49 MIL/uL — AB (ref 3.87–5.11)
RDW: 17.4 % — ABNORMAL HIGH (ref 11.5–15.5)
WBC: 3.9 10*3/uL — ABNORMAL LOW (ref 4.0–10.5)

## 2017-04-15 LAB — IRON AND TIBC
IRON: 38 ug/dL (ref 28–170)
SATURATION RATIOS: 14 % (ref 10.4–31.8)
TIBC: 276 ug/dL (ref 250–450)
UIBC: 238 ug/dL

## 2017-04-15 LAB — TYPE AND SCREEN
ABO/RH(D): B POS
Antibody Screen: NEGATIVE
UNIT DIVISION: 0
Unit division: 0

## 2017-04-15 LAB — BPAM RBC
BLOOD PRODUCT EXPIRATION DATE: 201905072359
BLOOD PRODUCT EXPIRATION DATE: 201905082359
ISSUE DATE / TIME: 201904132009
ISSUE DATE / TIME: 201904132009
UNIT TYPE AND RH: 7300
Unit Type and Rh: 7300

## 2017-04-15 LAB — LIPID PANEL
CHOLESTEROL: 104 mg/dL (ref 0–200)
HDL: 42 mg/dL (ref >40)
LDL Cholesterol: 51 mg/dL (ref 0–99)
TRIGLYCERIDES: 54 mg/dL (ref <150)
Total CHOL/HDL Ratio: 2.5 RATIO
VLDL: 11 mg/dL (ref 0–40)

## 2017-04-15 LAB — BRAIN NATRIURETIC PEPTIDE: B Natriuretic Peptide: 600.3 pg/mL — ABNORMAL HIGH (ref 0.0–100.0)

## 2017-04-15 LAB — PROTIME-INR
INR: 1.61
PROTHROMBIN TIME: 19 s — AB (ref 11.4–15.2)

## 2017-04-15 LAB — TROPONIN I
TROPONIN I: 0.35 ng/mL — AB (ref <0.03)
TROPONIN I: 0.61 ng/mL — AB (ref <0.03)
Troponin I: 0.88 ng/mL (ref <0.03)

## 2017-04-15 LAB — PHOSPHORUS: PHOSPHORUS: 4.5 mg/dL (ref 2.5–4.6)

## 2017-04-15 LAB — MRSA PCR SCREENING: MRSA BY PCR: NEGATIVE

## 2017-04-15 MED ORDER — LANTHANUM CARBONATE 500 MG PO CHEW
1000.0000 mg | CHEWABLE_TABLET | Freq: Three times a day (TID) | ORAL | Status: DC
Start: 2017-04-15 — End: 2017-04-17
  Filled 2017-04-15 (×2): qty 2

## 2017-04-15 MED ORDER — LANTHANUM CARBONATE 500 MG PO CHEW
1000.0000 mg | CHEWABLE_TABLET | Freq: Two times a day (BID) | ORAL | Status: DC
  Filled 2017-04-15: qty 2

## 2017-04-15 MED ORDER — CARVEDILOL 12.5 MG PO TABS
12.5000 mg | ORAL_TABLET | Freq: Two times a day (BID) | ORAL | Status: DC
  Administered 2017-04-16: 12.5 mg via ORAL
  Filled 2017-04-15: qty 1

## 2017-04-15 MED ORDER — DILTIAZEM HCL 60 MG PO TABS
30.0000 mg | ORAL_TABLET | Freq: Four times a day (QID) | ORAL | Status: DC
  Administered 2017-04-15 – 2017-04-17 (×6): 30 mg via ORAL
  Filled 2017-04-15 (×6): qty 1

## 2017-04-15 MED ORDER — ENOXAPARIN SODIUM 80 MG/0.8ML ~~LOC~~ SOLN
75.0000 mg | Freq: Every day | SUBCUTANEOUS | Status: DC
  Administered 2017-04-15 – 2017-04-17 (×2): 75 mg via SUBCUTANEOUS
  Filled 2017-04-15 (×5): qty 0.8

## 2017-04-15 MED ORDER — ISOSORBIDE DINITRATE 10 MG PO TABS
10.0000 mg | ORAL_TABLET | Freq: Three times a day (TID) | ORAL | Status: DC
  Administered 2017-04-16 – 2017-04-19 (×10): 10 mg via ORAL
  Filled 2017-04-15 (×11): qty 1

## 2017-04-15 MED ORDER — CINACALCET HCL 30 MG PO TABS
120.0000 mg | ORAL_TABLET | ORAL | Status: DC | PRN
  Filled 2017-04-15: qty 4

## 2017-04-15 MED ORDER — CALCITRIOL 0.5 MCG PO CAPS
2.2500 ug | ORAL_CAPSULE | ORAL | Status: DC | PRN
  Administered 2017-04-18: 2.25 ug via ORAL
  Filled 2017-04-15: qty 1

## 2017-04-15 MED ORDER — METOPROLOL TARTRATE 5 MG/5ML IV SOLN
2.5000 mg | Freq: Once | INTRAVENOUS | Status: AC
  Administered 2017-04-16: 2.5 mg via INTRAVENOUS
  Filled 2017-04-15: qty 5

## 2017-04-15 MED ORDER — SODIUM CHLORIDE 0.9 % IV BOLUS
250.0000 mL | Freq: Once | INTRAVENOUS | Status: AC
  Administered 2017-04-16: 250 mL via INTRAVENOUS

## 2017-04-15 MED ORDER — EPOETIN ALFA 10000 UNIT/ML IJ SOLN
9600.0000 [IU] | INTRAMUSCULAR | Status: DC
  Administered 2017-04-18: 9600 [IU] via INTRAVENOUS
  Filled 2017-04-15 (×5): qty 1

## 2017-04-15 NOTE — Consult Note (Signed)
Hospital Consult    Reason for Consult:  Infected right femoral tdc Referring Physician:  Dr. Verlon Au MRN #:  [id number]  History of Present Illness: This is a 72 y.o. female with recent placement of right femoral tdc and left upper arm avg on 4.9.19.  She had a 1 day history of fever and has had drainage around her tunneled catheter.  She is still having swelling of her left upper arm where the graft was placed and this has not been used yet.  Catheter has been working well and right groin.  She takes Coumadin but her INR is normal today.  Past Medical History:  Diagnosis Date  . Anemia    History  of Blood transfusion  . Asthma    PMH  . Atrial fibrillation with RVR ([place]) 11/2016  . CAD (coronary artery disease)    stent to RCA  . Cancer ([place])    clear cell cancer, kidney  . Chronic renal insufficiency    On hemodialysis  . Complication of anesthesia 12/2010   pt is very confused, with AMS with anesthesia  . CVA 07/27/2008   CVA affected cognition and memory per family, no focal deficits.    . CVA (cerebral infarction) 2003   no apparent residual  . Diastolic congestive heart failure ([place])   . Dyslipidemia   . Encephalopathy   . ESRD 07/27/2008   ESRD due to HTN and NSAID's, started hemodialysis in 2005 in [place], [place]. Went to [facility] from 2010 to 2012 and since 2012 has been getting dialysis at [facility] on [address] in [place] on a MWF schedule. First access with RUA AVG placed in [place]. Next and current access was LUA AVG placed by Dr. Lucky Cowboy in [place] in or around 2012. Has had 2 or 3 procedures on that graft since placed per family. She gets her access work done here in [place] now. She is allergic to heparin and does not get any heparin at dialysis; she had an allergic reaction apparently when in ICU in the past.      . GERD (gastroesophageal reflux disease)   . Hyperlipidemia   . Hypertension   . Hypothyroidism   . Myocardial infarction ([place])      per pt  . Positive PPD    completed rifampin  . Pulmonary hypertension ([place])   . PVD (peripheral vascular disease) ([place])   . Seizures ([place])    last seizure 6 years ago  . Sleep apnea    Sleep Study 2008  . Traumatic seroma of left lower leg ([place])   . Wears glasses   . Wears partial dentures     Past Surgical History:  Procedure Laterality Date  . ABDOMINAL AORTOGRAM W/LOWER EXTREMITY Bilateral 06/15/2016   Procedure: Abdominal Aortogram w/Lower Extremity;  Surgeon: Conrad [place], MD;  Location: [place] [facility];  Service: Cardiovascular;  Laterality: Bilateral;  . ABDOMINAL HYSTERECTOMY    . APPENDECTOMY    . APPLICATION OF WOUND VAC Left 09/12/2016   Procedure: APPLICATION OF WOUND VAC;  Surgeon: Conrad [place], MD;  Location: [place];  Service: Vascular;  Laterality: Left;  . ARTERY EXPLORATION Left 07/25/2016   Procedure: ARTERY EXPLORATION LEFT ABOVE KNEE POPLITEAL AND GROIN;  Surgeon: Conrad Braden, MD;  Location: [place];  Service: Vascular;  Laterality: Left;  . AV FISTULA PLACEMENT Left 04/10/2017   Procedure: INSERTION OF ARTERIOVENOUS (AV) GORE-TEX GRAFT REDO LEFT UPPER ARM;  Surgeon: Conrad [place], MD;  Location: [facility]  OR;  Service: Vascular;  Laterality: Left;  . CARDIAC CATHETERIZATION     last 2016  . CHOLECYSTECTOMY    . COLONOSCOPY    . D&Cs    . HEMATOMA EVACUATION Left 09/12/2016   Procedure: EVACUATION HEMATOMA;  Surgeon: Conrad Bantam, MD;  Location: [place];  Service: Vascular;  Laterality: Left;  . INSERTION OF DIALYSIS CATHETER Right 04/10/2017   Procedure: INSERTION OF DIALYSIS CATHETER;  Surgeon: Conrad Oakdale, MD;  Location: [facility] OR;  Service: Vascular;  Laterality: Right;  . IR RADIOLOGIST EVAL & MGMT  03/13/2017  . LEFT HEART CATH AND CORONARY ANGIOGRAPHY N/A 11/22/2016   Procedure: LEFT HEART CATH AND CORONARY ANGIOGRAPHY;  Surgeon: Belva Crome, MD;  Location: [place] CV LAB;  Service: Cardiovascular;  Laterality: N/A;  . LEFT HEART CATHETERIZATION WITH  CORONARY ANGIOGRAM N/A 02/22/2011   Procedure: LEFT HEART CATHETERIZATION WITH CORONARY ANGIOGRAM;  Surgeon: Wellington Hampshire, MD;  Location: [place] CATH LAB;  Service: Cardiovascular;  Laterality: N/A;  . left nephrectomy    . MULTIPLE TOOTH EXTRACTIONS    . NEPHRECTOMY Left    Malignant tumor  . PERIPHERAL VASCULAR CATHETERIZATION N/A 12/31/2014   Procedure: Abdominal Aortogram;  Surgeon: Conrad [place], MD;  Location: [place] CV LAB;  Service: Cardiovascular;  Laterality: N/A;  . right ankle repair    . RIGHT HEART CATHETERIZATION N/A 01/29/2014   Procedure: RIGHT HEART CATH;  Surgeon: Larey Dresser, MD;  Location: [facility] C[facility];  Service: Cardiovascular;  Laterality: N/A;  . TONSILLECTOMY    . TUBAL LIGATION    . ULTRASOUND GUIDANCE FOR VASCULAR ACCESS  11/22/2016   Procedure: Ultrasound Guidance For Vascular Access;  Surgeon: Belva Crome, MD;  Location: [place] CV LAB;  Service: Cardiovascular;;  . UPPER EXTREMITY VENOGRAPHY Bilateral 03/22/2017   Procedure: UPPER EXTREMITY VENOGRAPHY;  Surgeon: Conrad [place], MD;  Location: [place] CV [facility];  Service: Cardiovascular;  Laterality: Bilateral;  bilateral  . VASCULAR SURGERY  11/2010   graft inserted to left arm    Allergies  Allergen Reactions  . Ace Inhibitors Anaphylaxis and Rash  . Heparin Other (See Comments)    MDs told her not to take after reaction in ICU  . Iohexol Swelling and Other (See Comments)    1970s; passed out and had facial/tongue swelling.  Requires 13-hour prep with prednisone and benadryl  . Phenytoin Other (See Comments)    Had reaction while in ICU; doesn't know.  MDs told her not to take ever again.  . Wellbutrin [Bupropion] Other (See Comments)    seizures  . Meperidine Hcl Swelling and Rash    Makes tongue swell  . Morphine Rash  . Penicillins Hives and Rash    Has patient had a PCN reaction causing immediate rash, facial/tongue/throat swelling, SOB or lightheadedness with hypotension: Yes Has  patient had a PCN reaction causing severe rash involving mucus membranes or skin necrosis: No Has patient had a PCN reaction that required hospitalization: No Has patient had a PCN reaction occurring within the last 10 years: No If all of the above answers are "NO", then may proceed with Cephalosporin use.    . Valproic Acid And Related Other (See Comments)    Confusion   . Iodinated Diagnostic Agents Other (See Comments)    Pre-meditate with benadryl and prednisone 3 times before appt.  . Pentazocine Lactate Other (See Comments)    Patient does not remember reaction to this med ([place]).  Prior to Admission medications   Medication Sig Start Date End Date Taking? Authorizing Provider  acetaminophen (TYLENOL) 500 MG tablet Take 1,000 mg by mouth daily as needed for mild pain.    Yes [provider]  albuterol (PROVENTIL HFA;VENTOLIN HFA) 108 (90 Base) MCG/ACT inhaler Inhale 1-2 puffs into the lungs every 6 (six) hours as needed for wheezing or shortness of breath. 01/05/17  Yes Marney Setting, NP  carvedilol (COREG) 25 MG tablet Take 25 mg by mouth 2 (two) times daily with a meal.   Yes [provider]  cilostazol (PLETAL) 100 MG tablet Take 1 tablet (100 mg total) by mouth 2 (two) times daily before a meal. 05/24/16  Yes Conrad Girard, MD  diltiazem (CARDIZEM CD) 120 MG 24 hr capsule Take 1 capsule (120 mg total) by mouth daily. 03/01/17  Yes Minus Breeding, MD  diphenhydrAMINE (BENADRYL) 50 MG capsule Take 1 capsule (50 mg total) by mouth every Monday, Wednesday, and Friday. On dialysis days 09/15/16  Yes Rhyne, Samantha J, PA-C  enoxaparin (LOVENOX) 80 MG/0.8ML injection Inject 0.8 mLs (80 mg total) into the skin daily. 04/03/17  Yes Minus Breeding, MD  folic acid-vitamin b complex-vitamin c-selenium-zinc (DIALYVITE) 3 MG TABS tablet Take 1 tablet by mouth daily.   Yes [provider]  hydrALAZINE (APRESOLINE) 25 MG tablet Take 25 mg by mouth 3 (three) times  daily.    Yes [provider]  HYDROcodone-acetaminophen (NORCO) 5-325 MG tablet Take 1 tablet by mouth every 6 (six) hours as needed for moderate pain. 04/10/17  Yes Dagoberto Ligas, PA-C  isosorbide dinitrate (ISORDIL) 20 MG tablet Take 1 tablet (20 mg total) by mouth 3 (three) times daily. 10/26/16  Yes Hochrein, Jeneen Rinks, MD  LETAIRIS 5 MG tablet TAKE 1 TABLET (5MG ) BY MOUTH DAILY. DO NOT HANDLE IF PREGNANT. DO NOTSPLIT, CRUSH, OR CHEW. AVOID INHALATION AND CONTACT WITH SKIN OR EYES. 09/18/16  Yes Juanito Doom, MD  levETIRAcetam (KEPPRA) 500 MG tablet Take 500 mg by mouth daily at 8 pm.   Yes [provider]  levothyroxine (SYNTHROID, LEVOTHROID) 150 MCG tablet Take 150 mcg by mouth daily before breakfast.   Yes [provider]  nitroGLYCERIN (NITROSTAT) 0.4 MG SL tablet Place 1 tablet (0.4 mg total) under the tongue every 5 (five) minutes as needed for chest pain. 10/26/16  Yes Minus Breeding, MD  omeprazole (PRILOSEC OTC) 20 MG tablet Take 20 mg by mouth daily.   Yes [provider]  pravastatin (PRAVACHOL) 40 MG tablet Take 1 tablet (40 mg total) by mouth at bedtime. 05/23/16  Yes Minus Breeding, MD  RENVELA 800 MG tablet Take 1,600 mg by mouth 3 (three) times daily with meals. May take 1600 mg with each snack 03/08/17  Yes [provider]  SENSIPAR 90 MG tablet Take 90 mg by mouth every Monday, Wednesday, and Friday with hemodialysis.  03/05/14  Yes [provider]  traZODone (DESYREL) 50 MG tablet Take 50 mg by mouth at bedtime as needed for sleep.    Yes [provider]  warfarin (COUMADIN) 7.5 MG tablet Take 1/2 to 1 tablet daily as directed by coumadin clinic 04/05/17  Yes Minus Breeding, MD    Social History   Socioeconomic History  . Marital status: Divorced    Spouse name: Not on file  . Number of children: 2  . Years of education: 87  . Highest education level: Not on file  Occupational History  . Occupation: Retired  Marine scientist  Employer: RETIRED  Social Needs  . Financial resource strain: Not on file  . Food insecurity:    Worry: Not on file    Inability: Not on file  . Transportation needs:    Medical: Not on file    Non-medical: Not on file  Tobacco Use  . Smoking status: Former Smoker    Packs/day: 0.00    Years: 53.00    Pack years: 0.00    Types: Cigarettes  . Smokeless tobacco: Never Used  . Tobacco comment: quit smoking cigarettes in January 2019  Substance and Sexual Activity  . Alcohol use: Yes    Alcohol/week: 0.0 oz    Comment: very rarely per pt  . Drug use: No    Comment: former marijuana use, several years  . Sexual activity: Not Currently    Birth control/protection: Post-menopausal  Lifestyle  . Physical activity:    Days per week: Not on file    Minutes per session: Not on file  . Stress: Not on file  Relationships  . Social connections:    Talks on phone: Not on file    Gets together: Not on file    Attends religious service: Not on file    Active member of club or organization: Not on file    Attends meetings of clubs or organizations: Not on file    Relationship status: Not on file  . Intimate partner violence:    Fear of current or ex partner: Not on file    Emotionally abused: Not on file    Physically abused: Not on file    Forced sexual activity: Not on file  Other Topics Concern  . Not on file  Social History Narrative  . Not on file     Family History  Problem Relation Age of Onset  . Heart disease Father   . Hypertension Mother   . Dementia Mother   . Coronary artery disease Sister   . Heart attack Sister   . Hypertension Brother     ROS:   Cardiovascular: []  chest pain/pressure []  palpitations []  SOB lying flat []  DOE []  pain in legs while walking []  pain in legs at rest []  pain in legs at night []  non-healing ulcers []  hx of DVT []  swelling in legs  Pulmonary: []  productive cough []  asthma/wheezing []  home  O2  Neurologic: []  weakness in []  arms []  legs []  numbness in []  arms []  legs []  hx of CVA []  mini stroke [] difficulty speaking or slurred speech []  temporary loss of vision in one eye []  dizziness  Hematologic: []  hx of cancer []  bleeding problems []  problems with blood clotting easily  Endocrine:   []  diabetes []  thyroid disease  GI []  vomiting blood []  blood in stool  GU: [x]  CKD/renal failure []  HD--[]  M/W/F or []  T/T/S []  burning with urination []  blood in urine  Psychiatric: []  anxiety []  depression  Musculoskeletal: []  arthritis []  joint pain  Integumentary: []  rashes []  ulcers  Constitutional: [x]  fever []  chills   Physical Examination  Vitals:   04/15/17 0336 04/15/17 0544  BP: (!) 99/58 101/72  Pulse: (!) 121 73  Resp: 20 15  Temp:  98.3 F (36.8 C)  SpO2: 96% 94%   Body mass index is 29.91 kg/m.  General:  WDWN in NAD HENT: WNL, normocephalic Pulmonary: normal non-labored breathing Cardiac: palpable femoral pulses Abdomen: soft, NT/ND, no masses Extremities: cannot feel thrill in left upper arm but there is considerable edema, there is  a weak bruit Neurologic: Her left hand is sensorimotor intact   CBC    Component Value Date/Time   WBC 3.9 (L) 04/15/2017 0556   RBC 3.49 (L) 04/15/2017 0556   HGB 9.4 (L) 04/15/2017 0556   HCT 28.8 (L) 04/15/2017 0556   PLT 120 (L) 04/15/2017 0556   MCV 82.5 04/15/2017 0556   MCH 26.9 04/15/2017 0556   MCHC 32.6 04/15/2017 0556   RDW 17.4 (H) 04/15/2017 0556   LYMPHSABS 1.1 04/14/2017 2359   MONOABS 0.3 04/14/2017 2359   EOSABS 0.1 04/14/2017 2359   BASOSABS 0.0 04/14/2017 2359    BMET    Component Value Date/Time   NA 136 04/15/2017 0556   K 4.0 04/15/2017 0556   K 4.9 01/16/2011 1503   CL 99 (L) 04/15/2017 0556   CO2 26 04/15/2017 0556   GLUCOSE 83 04/15/2017 0556   BUN 18 04/15/2017 0556   CREATININE 4.56 (H) 04/15/2017 0556   CREATININE 4.16 (H) 06/04/2015 1654   CALCIUM 8.7  (L) 04/15/2017 0556   GFRNONAA 9 (L) 04/15/2017 0556   GFRNONAA 10 (L) 06/04/2015 1654   GFRAA 10 (L) 04/15/2017 0556   GFRAA 12 (L) 06/04/2015 1654    COAGS: Lab Results  Component Value Date   INR 1.61 04/15/2017   INR 1.52 04/14/2017   INR 1.39 04/10/2017     ASSESSMENT/PLAN: This is a 72 y.o. female with infected tdc.  TDC was removed at bedside and pressure was held for greater than 10 minutes until hemostatic.  I discussed with hospitalist team giving the patient a line holiday and then she will need temporary catheter prior to transitioning to repeat tunneled catheter for discharge.  Continue antibiotics.  We will continue to give the graft a few more weeks to incorporate but she may need shuntogram prior to use given current exam.   Eithel Ryall C. Donzetta Matters, MD [facility] Office: [phone] Pager: [phone]

## 2017-04-15 NOTE — Progress Notes (Signed)
CRITICAL VALUE ALERT  Critical Value:  0.35 Troponin  Date & Time Notied:  4/14 0120  Provider Notified: [facility])  Orders Received/Actions taken: Will continue to monitor, no new orders

## 2017-04-15 NOTE — Progress Notes (Signed)
Hospitalist progress note   [name]  RJJ:[id number] DOB: 1945/03/01 DOA: 04/14/2017 PCP: Glendale Chard, MD  Specialists:  Dr. Lake Bells pulmonary Dr. Percival Spanish cardiology [facility] [place] [facility]  Brief Narrative:  71 ESRD HD MWF X 2008, OSA, CAD MI RCA sten-known t systolic HF 01%, A. fib, new dialysis access placed 4/9 LUE-- cough, - fever, - abdominal pain, - chills Admit pulmonary edema, chest pain, +-- troponin (atypical), hypotensive, A. fib RVR on metoprolol IV and placed on IV heparin Also new anemia-transfused 2 unit PRBC Blood culture pending, culture drainage pending  Assessment & Plan:   Atypical chest pain-troponin trend flat, note peak troponin 11/2016  was 3, repeat EKG this a.m. and continue to trend troponin, reasonable to continue IV heparin at this time, will discuss with cardiology planning for management if trend increases or EKG looks suspicious for ischemia/infarct--continue Pletal 100 twice daily-Coreg 25 twice daily meal Note was hospitalized 11/2016 with right coronary "unable to be treated with PCI due to ostial stent overhang- unclear if at that time or in the future would be a surgical candidate given comorbidities and smoker" I curb sided Dr. Aundra Dubin of cardiology who felt that with a normal EKG and a flat trend of troponin there is no current indication for further workup given recent cardiac cath as above-we will monitor symptoms  New anemia baseline 9-10 on admission 7.6-S/P 2U PRBC-volume management w dialysis,-high output heart failure causing strain more likely cause of troponin-see above discussion  Pulmonary hypertension-currently on Latairis.  Continue management  Atrial fibrillation chads score >3 since 11/23/2016-at home on Coumadin 7.5-on IV heparin currently?  Procedure need-may need to reduce dose of Cardizem CD from 120-patient has PVCs so we will see how the patient does  Infected TDC-discussed with Dr. Mar Daring thigh graft TDC removed line  holiday X1 to 2 more days as per nephrology and vascular regarding replacement of the same pending cultures  OSA-BiPAP if will tolerate  ESRD MWF-appreciate nephrology-obtain phosphorus, magnesium and check iron studies-may need Aranesp + ferrous sulfate? Binder as per nephrology     Patient is on IV heparin, full code, discussed with daughter in detail as well as Dr. Gwenlyn Saran  and discussed with Dr. Algernon Huxley of cardiology as a telephone curbside consult  Procedures:  Removal left [facility] [temporary dialysis catheter] right thigh  Antimicrobials:   Vancomycin 4/14  Ceftaz a dime 4/13  Subjective: Awake alert anxious in no distress no chest pain no fever no chills Present in the room when Dr. Donzetta Matters removed [facility] Otherwise looks well Pre-contemplative regarding smoking Asking me if I am the cardiologist enough "I will let her eat"  Objective: Vitals:   04/15/17 0037 04/15/17 0336 04/15/17 0544 04/15/17 0830  BP: 118/64 (!) 99/58 101/72 126/67  Pulse:  (!) 121 73 (!) 122  Resp: 19 20 15 20   Temp:   98.3 F (36.8 C)   TempSrc:   Oral   SpO2:  96% 94% 96%  Weight:   75.4 kg (166 lb 3.2 oz)   Height:        Intake/Output Summary (Last 24 hours) at 04/15/2017 0908 Last data filed at 04/15/2017 0629 Gross per 24 hour  Intake 732.5 ml  Output 2013 ml  Net -1280.5 ml   Filed Weights   04/14/17 2257 04/14/17 2331 04/15/17 0544  Weight: 73.3 kg (161 lb 9.6 oz) 75 kg (165 lb 4.8 oz) 75.4 kg (166 lb 3.2 oz)    Examination: Awake alert pleasant no distress looking  about stated age arcus senilis no JVD no bruit S1-S2 no murmur has PVCs and a paced rhythm without any tachyarrhythmia on monitors Abdomen is soft nontender no rebound Left upper arm has 2 areas of fistula right has an old 1 Right lower extremity visualized with Dr. Gwenlyn Saran Neurologically intact Anxious and congruent No lower extremity edema   Data Reviewed: I have personally reviewed following labs and imaging  studies  CBC: Recent Labs  Lab 04/10/17 0638 04/14/17 1427 04/14/17 2359 04/15/17 0556  WBC  --  4.5 4.4 3.9*  NEUTROABS  --   --  2.8  --   HGB 9.2* 7.6* 10.4* 9.4*  HCT 27.0* 23.8* 31.4* 28.8*  MCV  --  81.8 83.5 82.5  PLT  --  138* 122* 671*   Basic Metabolic Panel: Recent Labs  Lab 04/10/17 0638 04/14/17 1427 04/15/17 0556  NA 135 135 136  K 4.2 4.1 4.0  CL  --  92* 99*  CO2  --  30 26  GLUCOSE 84 104* 83  BUN  --  34* 18  CREATININE  --  6.81* 4.56*  CALCIUM  --  8.8* 8.7*   GFR: Estimated Creatinine Clearance: 10.7 mL/min (A) (by C-G formula based on SCr of 4.56 mg/dL (H)). Liver Function Tests: No results for input(s): AST, ALT, ALKPHOS, BILITOT, PROT, ALBUMIN in the last 168 hours. No results for input(s): LIPASE, AMYLASE in the last 168 hours. No results for input(s): AMMONIA in the last 168 hours. Coagulation Profile: Recent Labs  Lab 04/10/17 0648 04/14/17 1427 04/15/17 0556  INR 1.39 1.52 1.61   Cardiac Enzymes:  Radiology Studies: Reviewed images personally in health database   Scheduled Meds: . ambrisentan  5 mg Oral Daily  . aspirin EC  81 mg Oral Daily  . carvedilol  25 mg Oral BID WC  . cilostazol  100 mg Oral BID AC  . [START ON 04/16/2017] cinacalcet  90 mg Oral Q M,W,F-HD  . diltiazem  120 mg Oral Daily  . [START ON 04/16/2017] diphenhydrAMINE  50 mg Oral Q M,W,F  . enoxaparin (LOVENOX) injection  75 mg Subcutaneous QHS  . isosorbide dinitrate  20 mg Oral TID  . levETIRAcetam  500 mg Oral Q2000  . levothyroxine  150 mcg Oral QAC breakfast  . multivitamin  1 tablet Oral QHS  . pantoprazole  40 mg Oral Daily  . pravastatin  40 mg Oral QHS  . sevelamer carbonate  1,600 mg Oral TID WC   Continuous Infusions: . sodium chloride    . [START ON 04/16/2017] cefTAZidime (FORTAZ)  IV    . diltiazem (CARDIZEM) infusion 5 mg/hr (04/15/17 0059)  . [START ON 04/16/2017] vancomycin       LOS: 1 day    Time spent: [place],  MD [facility] ([facility]  If 7PM-7AM, please contact night-coverage [email or web address] Password [email or web address] 04/15/2017, 9:08 AM

## 2017-04-15 NOTE — H&P (View-Only) (Signed)
Hospital Consult    Reason for Consult:  Infected right femoral tdc Referring Physician:  Dr. Verlon Au MRN #:  [id number]  History of Present Illness: This is a 72 y.o. female with recent placement of right femoral tdc and left upper arm avg on 4.9.19.  She had a 1 day history of fever and has had drainage around her tunneled catheter.  She is still having swelling of her left upper arm where the graft was placed and this has not been used yet.  Catheter has been working well and right groin.  She takes Coumadin but her INR is normal today.  Past Medical History:  Diagnosis Date  . Anemia    History  of Blood transfusion  . Asthma    PMH  . Atrial fibrillation with RVR ([place]) 11/2016  . CAD (coronary artery disease)    stent to RCA  . Cancer ([place])    clear cell cancer, kidney  . Chronic renal insufficiency    On hemodialysis  . Complication of anesthesia 12/2010   pt is very confused, with AMS with anesthesia  . CVA 07/27/2008   CVA affected cognition and memory per family, no focal deficits.    . CVA (cerebral infarction) 2003   no apparent residual  . Diastolic congestive heart failure ([place])   . Dyslipidemia   . Encephalopathy   . ESRD 07/27/2008   ESRD due to HTN and NSAID's, started hemodialysis in 2005 in [place], [place]. Went to [facility] from 2010 to 2012 and since 2012 has been getting dialysis at [facility] on [address] in [place] on a MWF schedule. First access with RUA AVG placed in [place]. Next and current access was LUA AVG placed by Dr. Lucky Cowboy in [place] in or around 2012. Has had 2 or 3 procedures on that graft since placed per family. She gets her access work done here in [place] now. She is allergic to heparin and does not get any heparin at dialysis; she had an allergic reaction apparently when in ICU in the past.      . GERD (gastroesophageal reflux disease)   . Hyperlipidemia   . Hypertension   . Hypothyroidism   . Myocardial infarction ([place])      per pt  . Positive PPD    completed rifampin  . Pulmonary hypertension ([place])   . PVD (peripheral vascular disease) ([place])   . Seizures ([place])    last seizure 6 years ago  . Sleep apnea    Sleep Study 2008  . Traumatic seroma of left lower leg ([place])   . Wears glasses   . Wears partial dentures     Past Surgical History:  Procedure Laterality Date  . ABDOMINAL AORTOGRAM W/LOWER EXTREMITY Bilateral 06/15/2016   Procedure: Abdominal Aortogram w/Lower Extremity;  Surgeon: Conrad Bricelyn, MD;  Location: [place];  Service: Cardiovascular;  Laterality: Bilateral;  . ABDOMINAL HYSTERECTOMY    . APPENDECTOMY    . APPLICATION OF WOUND VAC Left 09/12/2016   Procedure: APPLICATION OF WOUND VAC;  Surgeon: Conrad [place] Manor, MD;  Location: [facility];  Service: Vascular;  Laterality: Left;  . ARTERY EXPLORATION Left 07/25/2016   Procedure: ARTERY EXPLORATION LEFT ABOVE KNEE POPLITEAL AND GROIN;  Surgeon: Conrad Cleona, MD;  Location: [facility];  Service: Vascular;  Laterality: Left;  . AV FISTULA PLACEMENT Left 04/10/2017   Procedure: INSERTION OF ARTERIOVENOUS (AV) GORE-TEX GRAFT REDO LEFT UPPER ARM;  Surgeon: Conrad F[place]erville, MD;  Location: [facility]  OR;  Service: Vascular;  Laterality: Left;  . CARDIAC CATHETERIZATION     last 2016  . CHOLECYSTECTOMY    . COLONOSCOPY    . D&Cs    . HEMATOMA EVACUATION Left 09/12/2016   Procedure: EVACUATION HEMATOMA;  Surgeon: Conrad Botetourt, MD;  Location: [facility];  Service: Vascular;  Laterality: Left;  . INSERTION OF DIALYSIS CATHETER Right 04/10/2017   Procedure: INSERTION OF DIALYSIS CATHETER;  Surgeon: Conrad [place], MD;  Location: [facility] OR;  Service: Vascular;  Laterality: Right;  . IR RADIOLOGIST EVAL & MGMT  03/13/2017  . LEFT HEART CATH AND CORONARY ANGIOGRAPHY N/A 11/22/2016   Procedure: LEFT HEART CATH AND CORONARY ANGIOGRAPHY;  Surgeon: Belva Crome, MD;  Location: [facility];  Service: Cardiovascular;  Laterality: N/A;  . LEFT HEART CATHETERIZATION WITH  CORONARY ANGIOGRAM N/A 02/22/2011   Procedure: LEFT HEART CATHETERIZATION WITH CORONARY ANGIOGRAM;  Surgeon: [place], MD;  Location: [facility] C[facility] LAB;  Service: Cardiovascular;  Laterality: N/A;  . left nephrectomy    . MULTIPLE TOOTH EXTRACTIONS    . NEPHRECTOMY Left    Malignant tumor  . PERIPHERAL VASCULAR CATHETERIZATION N/A 12/31/2014   Procedure: Abdominal Aortogram;  Surgeon: Conrad Sorrento, MD;  Location: [facility];  Service: Cardiovascular;  Laterality: N/A;  . right ankle repair    . RIGHT HEART CATHETERIZATION N/A 01/29/2014   Procedure: RIGHT HEART CATH;  Surgeon: Larey Dresser, MD;  Location: [facility] CATH LAB;  Service: Cardiovascular;  Laterality: N/A;  . TONSILLECTOMY    . TUBAL LIGATION    . ULTRASOUND GUIDANCE FOR VASCULAR ACCESS  11/22/2016   Procedure: Ultrasound Guidance For Vascular Access;  Surgeon: Belva Crome, MD;  Location: [place] [facility];  Service: Cardiovascular;;  . UPPER EXTREMITY VENOGRAPHY Bilateral 03/22/2017   Procedure: UPPER EXTREMITY VENOGRAPHY;  Surgeon: Conrad Snoh[place]ish, MD;  Location: [facility];  Service: Cardiovascular;  Laterality: Bilateral;  bilateral  . VASCULAR SURGERY  11/2010   graft inserted to left arm    Allergies  Allergen Reactions  . Ace Inhibitors Anaphylaxis and Rash  . Heparin Other (See Comments)    MDs told her not to take after reaction in ICU  . Iohexol Swelling and Other (See Comments)    1970s; passed out and had facial/tongue swelling.  Requires 13-hour prep with prednisone and benadryl  . Phenytoin Other (See Comments)    Had reaction while in ICU; doesn't know.  MDs told her not to take ever again.  . Wellbutrin [Bupropion] Other (See Comments)    seizures  . Meperidine Hcl Swelling and Rash    Makes tongue swell  . Morphine Rash  . Penicillins Hives and Rash    Has patient had a PCN reaction causing immediate rash, facial/tongue/throat swelling, SOB or lightheadedness with hypotension: Yes Has  patient had a PCN reaction causing severe rash involving mucus membranes or skin necrosis: No Has patient had a PCN reaction that required hospitalization: No Has patient had a PCN reaction occurring within the last 10 years: No If all of the above answers are "NO", then may proceed with Cephalosporin use.    . Valproic Acid And Related Other (See Comments)    Confusion   . Iodinated Diagnostic Agents Other (See Comments)    Pre-meditate with benadryl and prednisone 3 times before appt.  . Pentazocine Lactate Other (See Comments)    Patient does not remember reaction to this med ([place]).  Prior to Admission medications   Medication Sig Start Date End Date Taking? Authorizing Provider  acetaminophen (TYLENOL) 500 MG tablet Take 1,000 mg by mouth daily as needed for mild pain.    Yes [provider]  albuterol (PROVENTIL HFA;VENTOLIN HFA) 108 (90 Base) MCG/ACT inhaler Inhale 1-2 puffs into the lungs every 6 (six) hours as needed for wheezing or shortness of breath. 01/05/17  Yes Marney Setting, NP  carvedilol (COREG) 25 MG tablet Take 25 mg by mouth 2 (two) times daily with a meal.   Yes [provider]  cilostazol (PLETAL) 100 MG tablet Take 1 tablet (100 mg total) by mouth 2 (two) times daily before a meal. 05/24/16  Yes Conrad Cyrus, MD  diltiazem (CARDIZEM CD) 120 MG 24 hr capsule Take 1 capsule (120 mg total) by mouth daily. 03/01/17  Yes Minus Breeding, MD  diphenhydrAMINE (BENADRYL) 50 MG capsule Take 1 capsule (50 mg total) by mouth every Monday, Wednesday, and Friday. On dialysis days 09/15/16  Yes Rhyne, Samantha J, PA-C  enoxaparin (LOVENOX) 80 MG/0.8ML injection Inject 0.8 mLs (80 mg total) into the skin daily. 04/03/17  Yes Minus Breeding, MD  folic acid-vitamin b complex-vitamin c-selenium-zinc (DIALYVITE) 3 MG TABS tablet Take 1 tablet by mouth daily.   Yes [provider]  hydrALAZINE (APRESOLINE) 25 MG tablet Take 25 mg by mouth 3 (three) times  daily.    Yes [provider]  HYDROcodone-acetaminophen (NORCO) 5-325 MG tablet Take 1 tablet by mouth every 6 (six) hours as needed for moderate pain. 04/10/17  Yes Dagoberto Ligas, PA-C  isosorbide dinitrate (ISORDIL) 20 MG tablet Take 1 tablet (20 mg total) by mouth 3 (three) times daily. 10/26/16  Yes Hochrein, Jeneen Rinks, MD  LETAIRIS 5 MG tablet TAKE 1 TABLET (5MG ) BY MOUTH DAILY. DO NOT HANDLE IF PREGNANT. DO NOTSPLIT, CRUSH, OR CHEW. AVOID INHALATION AND CONTACT WITH SKIN OR EYES. 09/18/16  Yes Juanito Doom, MD  levETIRAcetam (KEPPRA) 500 MG tablet Take 500 mg by mouth daily at 8 pm.   Yes [provider]  levothyroxine (SYNTHROID, LEVOTHROID) 150 MCG tablet Take 150 mcg by mouth daily before breakfast.   Yes [provider]  nitroGLYCERIN (NITROSTAT) 0.4 MG SL tablet Place 1 tablet (0.4 mg total) under the tongue every 5 (five) minutes as needed for chest pain. 10/26/16  Yes Minus Breeding, MD  omeprazole (PRILOSEC OTC) 20 MG tablet Take 20 mg by mouth daily.   Yes [provider]  pravastatin (PRAVACHOL) 40 MG tablet Take 1 tablet (40 mg total) by mouth at bedtime. 05/23/16  Yes Minus Breeding, MD  RENVELA 800 MG tablet Take 1,600 mg by mouth 3 (three) times daily with meals. May take 1600 mg with each snack 03/08/17  Yes [provider]  SENSIPAR 90 MG tablet Take 90 mg by mouth every Monday, Wednesday, and Friday with hemodialysis.  03/05/14  Yes [provider]  traZODone (DESYREL) 50 MG tablet Take 50 mg by mouth at bedtime as needed for sleep.    Yes [provider]  warfarin (COUMADIN) 7.5 MG tablet Take 1/2 to 1 tablet daily as directed by coumadin clinic 04/05/17  Yes Minus Breeding, MD    Social History   Socioeconomic History  . Marital status: Divorced    Spouse name: Not on file  . Number of children: 2  . Years of education: 69  . Highest education level: Not on file  Occupational History  . Occupation: Retired  Marine scientist  Employer: RETIRED  Social Needs  . Financial resource strain: Not on file  . Food insecurity:    Worry: Not on file    Inability: Not on file  . Transportation needs:    Medical: Not on file    Non-medical: Not on file  Tobacco Use  . Smoking status: Former Smoker    Packs/day: 0.00    Years: 53.00    Pack years: 0.00    Types: Cigarettes  . Smokeless tobacco: Never Used  . Tobacco comment: quit smoking cigarettes in January 2019  Substance and Sexual Activity  . Alcohol use: Yes    Alcohol/week: 0.0 oz    Comment: very rarely per pt  . Drug use: No    Comment: former marijuana use, several years  . Sexual activity: Not Currently    Birth control/protection: Post-menopausal  Lifestyle  . Physical activity:    Days per week: Not on file    Minutes per session: Not on file  . Stress: Not on file  Relationships  . Social connections:    Talks on phone: Not on file    Gets together: Not on file    Attends religious service: Not on file    Active member of club or organization: Not on file    Attends meetings of clubs or organizations: Not on file    Relationship status: Not on file  . Intimate partner violence:    Fear of current or ex partner: Not on file    Emotionally abused: Not on file    Physically abused: Not on file    Forced sexual activity: Not on file  Other Topics Concern  . Not on file  Social History Narrative  . Not on file     Family History  Problem Relation Age of Onset  . Heart disease Father   . Hypertension Mother   . Dementia Mother   . Coronary artery disease Sister   . Heart attack Sister   . Hypertension Brother     ROS:   Cardiovascular: []  chest pain/pressure []  palpitations []  SOB lying flat []  DOE []  pain in legs while walking []  pain in legs at rest []  pain in legs at night []  non-healing ulcers []  hx of DVT []  swelling in legs  Pulmonary: []  productive cough []  asthma/wheezing []  home  O2  Neurologic: []  weakness in []  arms []  legs []  numbness in []  arms []  legs []  hx of CVA []  mini stroke [] difficulty speaking or slurred speech []  temporary loss of vision in one eye []  dizziness  Hematologic: []  hx of cancer []  bleeding problems []  problems with blood clotting easily  Endocrine:   []  diabetes []  thyroid disease  GI []  vomiting blood []  blood in stool  GU: [x]  CKD/renal failure []  HD--[]  M/W/F or []  T/T/S []  burning with urination []  blood in urine  Psychiatric: []  anxiety []  depression  Musculoskeletal: []  arthritis []  joint pain  Integumentary: []  rashes []  ulcers  Constitutional: [x]  fever []  chills   Physical Examination  Vitals:   04/15/17 0336 04/15/17 0544  BP: (!) 99/58 101/72  Pulse: (!) 121 73  Resp: 20 15  Temp:  98.3 F (36.8 C)  SpO2: 96% 94%   Body mass index is 29.91 kg/m.  General:  WDWN in NAD HENT: WNL, normocephalic Pulmonary: normal non-labored breathing Cardiac: palpable femoral pulses Abdomen: soft, NT/ND, no masses Extremities: cannot feel thrill in left upper arm but there is considerable edema, there is  a weak bruit Neurologic: Her left hand is sensorimotor intact   CBC    Component Value Date/Time   WBC 3.9 (L) 04/15/2017 0556   RBC 3.49 (L) 04/15/2017 0556   HGB 9.4 (L) 04/15/2017 0556   HCT 28.8 (L) 04/15/2017 0556   PLT 120 (L) 04/15/2017 0556   MCV 82.5 04/15/2017 0556   MCH 26.9 04/15/2017 0556   MCHC 32.6 04/15/2017 0556   RDW 17.4 (H) 04/15/2017 0556   LYMPHSABS 1.1 04/14/2017 2359   MONOABS 0.3 04/14/2017 2359   EOSABS 0.1 04/14/2017 2359   BASOSABS 0.0 04/14/2017 2359    BMET    Component Value Date/Time   NA 136 04/15/2017 0556   K 4.0 04/15/2017 0556   K 4.9 01/16/2011 1503   CL 99 (L) 04/15/2017 0556   CO2 26 04/15/2017 0556   GLUCOSE 83 04/15/2017 0556   BUN 18 04/15/2017 0556   CREATININE 4.56 (H) 04/15/2017 0556   CREATININE 4.16 (H) 06/04/2015 1654   CALCIUM 8.7  (L) 04/15/2017 0556   GFRNONAA 9 (L) 04/15/2017 0556   GFRNONAA 10 (L) 06/04/2015 1654   GFRAA 10 (L) 04/15/2017 0556   GFRAA 12 (L) 06/04/2015 1654    COAGS: Lab Results  Component Value Date   INR 1.61 04/15/2017   INR 1.52 04/14/2017   INR 1.39 04/10/2017     ASSESSMENT/PLAN: This is a 72 y.o. female with infected tdc.  TDC was removed at bedside and pressure was held for greater than 10 minutes until hemostatic.  I discussed with hospitalist team giving the patient a line holiday and then she will need temporary catheter prior to transitioning to repeat tunneled catheter for discharge.  Continue antibiotics.  We will continue to give the graft a few more weeks to incorporate but she may need shuntogram prior to use given current exam.   Mitchelle Sultan C. Donzetta Matters, MD [facility] Office: [phone] Pager: [phone]

## 2017-04-15 NOTE — Progress Notes (Addendum)
[facility] Progress Note   Subjective:   No CP/SOB today.  Frustrated with being sick.  Stated "I just want to make it until my 2 granddaughters graduate high school."    Objective Vitals:   04/15/17 0037 04/15/17 0336 04/15/17 0544 04/15/17 0830  BP: 118/64 (!) 99/58 101/72 126/67  Pulse:  (!) 121 73 (!) 122  Resp: 19 20 15 20   Temp:   98.3 F (36.8 C)   TempSrc:   Oral   SpO2:  96% 94% 96%  Weight:   75.4 kg (166 lb 3.2 oz)   Height:       Physical Exam General:NAD, WDWN tearful, pleasant female Heart: A fib, no mrg Lungs:CTAB Abdomen:soft, NTND Extremities:no LE edema Dialysis Access: LU AVG +b/t, R thigh [facility]   Filed Weights   04/14/17 2257 04/14/17 2331 04/15/17 0544  Weight: 73.3 kg (161 lb 9.6 oz) 75 kg (165 lb 4.8 oz) 75.4 kg (166 lb 3.2 oz)    Intake/Output Summary (Last 24 hours) at 04/15/2017 0851 Last data filed at 04/15/2017 0629 Gross per 24 hour  Intake 732.5 ml  Output 2013 ml  Net -1280.5 ml    Additional Objective Labs: Basic Metabolic Panel: Recent Labs  Lab 04/10/17 0638 04/14/17 1427 04/15/17 0556  NA 135 135 136  K 4.2 4.1 4.0  CL  --  92* 99*  CO2  --  30 26  GLUCOSE 84 104* 83  BUN  --  34* 18  CREATININE  --  6.81* 4.56*  CALCIUM  --  8.8* 8.7*   CBC: Recent Labs  Lab 04/14/17 1427 04/14/17 2359 04/15/17 0556  WBC 4.5 4.4 3.9*  NEUTROABS  --  2.8  --   HGB 7.6* 10.4* 9.4*  HCT 23.8* 31.4* 28.8*  MCV 81.8 83.5 82.5  PLT 138* 122* 120*   Blood Culture    Component Value Date/Time   SDES BLOOD HEMODIALYSIS CATHETER 04/14/2017 1920   SPECREQUEST  04/14/2017 1920    BOTTLES DRAWN AEROBIC AND ANAEROBIC Blood Culture results may not be optimal due to an excessive volume of blood received in culture bottles   CULT PENDING 04/14/2017 1920   REPTSTATUS PENDING 04/14/2017 1920    Cardiac Enzymes: Recent Labs  Lab 04/14/17 2359 04/15/17 0556  TROPONINI 0.35* 0.61*    Lab Results  Component Value Date    INR 1.61 04/15/2017   INR 1.52 04/14/2017   INR 1.39 04/10/2017   Studies/Results: Dg Chest 2 View  Result Date: 04/14/2017 CLINICAL DATA:  Chest pain today EXAM: CHEST - 2 VIEW COMPARISON:  Chest x-rays dated 04/10/2017 and 01/05/2017. FINDINGS: Stable cardiomegaly. Aortic atherosclerosis. Central catheter from femoral pro ches stable in position with tip at the expected level of the cavoatrial junction. Diffuse interstitial prominence is stable compared to recent exam, increased compared to an earlier chest x-ray of 10/06/2013. no confluent opacity to suggest a consolidating pneumonia. No pleural effusion or pneumothorax seen. No acute or suspicious osseous finding. IMPRESSION: 1. Cardiomegaly with persistent interstitial prominence, presumed interstitial edema, compatible with chronic mild CHF. No evidence of overt alveolar pulmonary edema. No evidence of pneumonia. 2. Aortic atherosclerosis. Electronically Signed   By: Franki Cabot M.D.   On: 04/14/2017 14:59    Medications: . sodium chloride    . [START ON 04/16/2017] cefTAZidime (FORTAZ)  IV    . diltiazem (CARDIZEM) infusion 5 mg/hr (04/15/17 0059)  . [START ON 04/16/2017] vancomycin     . ambrisentan  5 mg Oral  Daily  . aspirin EC  81 mg Oral Daily  . carvedilol  25 mg Oral BID WC  . cilostazol  100 mg Oral BID AC  . [START ON 04/16/2017] cinacalcet  90 mg Oral Q M,W,F-HD  . diltiazem  120 mg Oral Daily  . [START ON 04/16/2017] diphenhydrAMINE  50 mg Oral Q M,W,F  . enoxaparin (LOVENOX) injection  75 mg Subcutaneous QHS  . isosorbide dinitrate  20 mg Oral TID  . levETIRAcetam  500 mg Oral Q2000  . levothyroxine  150 mcg Oral QAC breakfast  . multivitamin  1 tablet Oral QHS  . pantoprazole  40 mg Oral Daily  . pravastatin  40 mg Oral QHS  . sevelamer carbonate  1,600 mg Oral TID WC    Dialysis Orders: MWF - [facility] EDW 75kg 2K 2Ca P4  Access: R thigh TDC, LU AVG maturing Epogen 9600 Units IV qHD - Weekly dose  28800 Sensipar 120mg  PO qHD Calcitriol 2.35mch PO qHD NO HEPARIN - allergy  Home meds: - coreg 25 bid/ dilt 120 qd/ hydral 25 tid - sl ntg prn/ isordil 20 tid - PPI/ sensipar 90/ desyrel/ T4/ norco prn/ pletal  - coumadin/ lovenox SQ - keppra - fosrenol 100mg  AC TID, 1 BID w/snacks  Assessment/Plan: 1. Chest pain/+troponin - troponin trending up. Cardio following.  2. Volume/Pulm edema - euvolemic on exam post HD yest. Continue to titrate down volume as tolerated.   3. R thigh TDC drainage - suspect cath infection. WC NGTD, BC pending. ABX started. VVS consulting for possible TDC exchange.  4. ESRD - HD yesterday for volume removal.  Continue per regular MWF schedule  while admitted. K 4.0. 5. A fib w/RVR - per primary. No heparin d/t allergy. On cardizem/MTP   6. CAD - known w/RCA stent, last cath Nov 7. CM - LVEF 30% - per primary 5. Anemia of CKD- Hgb 9.4 s/p 2Unit pRBC, start epogen 9600 Unit qHD tomorrow 6. Secondary hyperparathyroidism - Ca in goal. Cont Senispar, calcitriol, and binders. 7. HTN - BP well controlled 6. Nutrition - Renal diet with fluid restrictions.  7. Seizure do - on keppra 8. S/p new LUA AVG - placed on 4/4 by Dr. Madalyn Rob, PA-C [facility] Pager: [phone] 04/15/2017,8:51 AM   Pt seen, examined and agree w A/P as above. Chronically ill patient w/ multiple medical problems. We were able to give blood and get 2 L off on HD last night in spite of borderline BP's and afib/ RVR.  Needs lowering of dry wt. Plan HD tomorrow.  Kelly Splinter MD [employer] pager [phone]   04/15/2017, 10:51 AM     LOS: 1 day

## 2017-04-16 ENCOUNTER — Inpatient Hospital Stay (HOSPITAL_COMMUNITY): Payer: Medicare Other

## 2017-04-16 ENCOUNTER — Encounter (HOSPITAL_COMMUNITY): Payer: Self-pay | Admitting: Cardiology

## 2017-04-16 DIAGNOSIS — I4891 Unspecified atrial fibrillation: Secondary | ICD-10-CM

## 2017-04-16 DIAGNOSIS — R079 Chest pain, unspecified: Secondary | ICD-10-CM

## 2017-04-16 MED ORDER — CARVEDILOL 25 MG PO TABS
25.0000 mg | ORAL_TABLET | Freq: Two times a day (BID) | ORAL | Status: DC
  Administered 2017-04-17 – 2017-04-18 (×3): 25 mg via ORAL
  Filled 2017-04-16 (×4): qty 1

## 2017-04-16 NOTE — Progress Notes (Signed)
Hospitalist progress note   [name]  IRS:[id number] DOB: 08-15-1945 DOA: 04/14/2017 PCP: Glendale Chard, MD  Specialists:  Dr. Lake Bells pulmonary Dr. Percival Spanish cardiology [facility] [place] [facility]  Brief Narrative:  71 ESRD HD MWF X 2008, OSA, CAD MI RCA sten-known t systolic HF 00%, A. fib, new dialysis access placed 4/9 LUE-- cough, - fever, - abdominal pain, - chills Admit pulmonary edema, chest pain, +-- troponin (atypical), hypotensive, A. fib RVR on metoprolol IV and placed on IV heparin Also new anemia-transfused 2 unit PRBC Blood culture pending, culture drainage pending  Assessment & Plan:   Atypical chest pain-troponin trend flat, note peak troponin 11/2016  was 3, repeat EKG this a.m. and continue to trend troponin, reasonable to continue IV heparin at this time, will discuss with cardiology planning for management if trend increases or EKG looks suspicious for ischemia/infarct--continue Pletal 100 twice daily-Coreg 25 twice daily meal  Note 11/2016 with right coronary "unable to be treated with PCI due to ostial stent overhang- unclear if at that time or in the future would be a surgical candidate given comorbidities and smoker" No role for cath per cards  -we will monitor symptoms---is CP free this am  New anemia baseline 9-10 on admission 7.6-S/P 2U PRBC-volume management w dialysis,-high output heart failure causing strain more likely cause of troponin-see above discussion Bloodcount 7--2u PRBC--10.4-->9.4 Rpt in am Dose aranesp Combined Acute blood loss aneami + anemia renal dx If further drop hemoccult stool  Pulmonary hypertension-currently on Latairis.  Continue management  Atrial fibrillation chads score >3 since 11/23/2016-at home on Coumadin 7.5-on rx doses lovenox currently?  Procedure need- Asked Cardiology to formally see and adjust meds-might need digoxin?  Infected TDC-discussed with Dr. Mar Daring thigh graft TDC removed line holiday X1 to 2 more days as per  nephrology and vascular regarding replacement of the same pending cultures Cuklt from 4/13 NGTD Rpt cultures done 4/15--will foll  OSA-BiPAP if will tolerate  ESRD MWF-appreciate nephrology-obtain phosphorus, magnesium and check iron studies-may need Aranesp + ferrous sulfate? Binder as per nephrology     Patient is on Lovenox {UNLIKELY HEPARIN ALLERGY] full code, no family  Dr. Percival Spanish aware of patient and will see  Procedures:  Removal left [facility] [temporary dialysis catheter] right thigh  Antimicrobials:   Vancomycin 4/14  Ceftaz a dime 4/13  Subjective: Awake alert anxious Wanting food No distress otherwsie No cp Note 250 cc bolus overnigt for low bp--suspect wa ssleeping at the time Bp now good but HR 120's She tells me this is "normal"  Objective: Vitals:   04/16/17 0411 04/16/17 0530 04/16/17 0751 04/16/17 0800  BP: 126/78  134/78   Pulse: (!) 135     Resp: (!) 21 16 18    Temp: 97.8 F (36.6 C)  97.8 F (36.6 C)   TempSrc: Oral  Oral   SpO2: 92%  98% 98%  Weight:      Height:        Intake/Output Summary (Last 24 hours) at 04/16/2017 1048 Last data filed at 04/16/2017 0500 Gross per 24 hour  Intake 633.58 ml  Output -  Net 633.58 ml   Filed Weights   04/14/17 2331 04/15/17 0544 04/16/17 0409  Weight: 75 kg (165 lb 4.8 oz) 75.4 kg (166 lb 3.2 oz) 76.8 kg (169 lb 4.8 oz)    Examination:  Awake alert pleasant no distress looking about stated age arcus senilis no JVD no bruit S1-S2 no murmur has PVCs and a paced rhythm without  any tachyarrhythmia on monitors Abdomen is soft nontender no rebound Left upper arm has 2 areas of fistula right has an old 1--arm is swollen but no pus LLE has dressing covering Neurologically intact Anxious  No lower extremity edema   Data Reviewed: I have personally reviewed following labs and imaging studies  CBC: Recent Labs  Lab 04/10/17 0638 04/14/17 1427 04/14/17 2359 04/15/17 0556  WBC  --  4.5 4.4 3.9*   NEUTROABS  --   --  2.8  --   HGB 9.2* 7.6* 10.4* 9.4*  HCT 27.0* 23.8* 31.4* 28.8*  MCV  --  81.8 83.5 82.5  PLT  --  138* 122* 242*   Basic Metabolic Panel: Recent Labs  Lab 04/10/17 0638 04/14/17 1427 04/15/17 0556 04/15/17 1031  NA 135 135 136  --   K 4.2 4.1 4.0  --   CL  --  92* 99*  --   CO2  --  30 26  --   GLUCOSE 84 104* 83  --   BUN  --  34* 18  --   CREATININE  --  6.81* 4.56*  --   CALCIUM  --  8.8* 8.7*  --   PHOS  --   --   --  4.5   GFR: Estimated Creatinine Clearance: 10.8 mL/min (A) (by C-G formula based on SCr of 4.56 mg/dL (H)). Liver Function Tests: No results for input(s): AST, ALT, ALKPHOS, BILITOT, PROT, ALBUMIN in the last 168 hours. No results for input(s): LIPASE, AMYLASE in the last 168 hours. No results for input(s): AMMONIA in the last 168 hours. Coagulation Profile: Recent Labs  Lab 04/10/17 0648 04/14/17 1427 04/15/17 0556  INR 1.39 1.52 1.61   Cardiac Enzymes:  Radiology Studies: Reviewed images personally in health database   Scheduled Meds: . ambrisentan  5 mg Oral Daily  . aspirin EC  81 mg Oral Daily  . carvedilol  12.5 mg Oral BID WC  . cilostazol  100 mg Oral BID AC  . diltiazem  120 mg Oral Daily  . diltiazem  30 mg Oral Q6H  . diphenhydrAMINE  50 mg Oral Q M,W,F  . enoxaparin (LOVENOX) injection  75 mg Subcutaneous QHS  . epoetin (EPOGEN/PROCRIT) injection  9,600 Units Intravenous Q M,W,F-HD  . isosorbide dinitrate  10 mg Oral TID  . lanthanum  1,000 mg Oral TID WC  . lanthanum  1,000 mg Oral BID BM  . levETIRAcetam  500 mg Oral Q2000  . levothyroxine  150 mcg Oral QAC breakfast  . multivitamin  1 tablet Oral QHS  . pantoprazole  40 mg Oral Daily  . pravastatin  40 mg Oral QHS   Continuous Infusions: . sodium chloride    . cefTAZidime (FORTAZ)  IV    . diltiazem (CARDIZEM) infusion Stopped (04/15/17 1312)  . vancomycin       LOS: 2 days    Time spent: [place], MD [facility]  If 7PM-7AM, please contact night-coverage [email or web address] Password [facility] 04/16/2017, 10:48 AM

## 2017-04-16 NOTE — Progress Notes (Signed)
Pt BP 89/55 HR 67. Pt is asymptomatic. Notified Dr Verlon Au. Will hold carvedilol and dilt for now. Notified Dr. Percival Spanish as well. WCTM.

## 2017-04-16 NOTE — Consult Note (Signed)
CARDIOLOGY CONSULT NOTE  Patient ID: [name] MRN: [id number] DOB/AGE: 03/12/1945 72 y.o.  Admit date: 04/14/2017 Primary Physician Glendale Chard, MD Primary Cardiologist Minus Breeding, MD Chief Complaint  Rapid atrial fib Requesting  Dr. Verlon Au  HPI:  With end-stage renal disease on dialysis and reduced EF we are asked to see her because of atrial fib with rapid rate and chest pain.   In the past she has had an extensive workup for dyspnea and this included an echocardiogram. This demonstrated pulmonary pressure to be 107 with an EF of 35-40%. She did have an evaluation in our office for consideration of an MRI because of a possible speckleding on echo. However, she could not have this done as she has some problems with claustrophobia. She had SPEP sent off. There was no protein spike on the SPEP. She had a right heart cath with a normal wedge. She was started on Letairis.    She presented on 4/13 with chest pain.  She has a new anemia.  She also had an infected dialysis catheter.   She has had a mildly elevated troponin with a very mild trend upward.  EKG on 4/13 demonstrated atrial fib with rapid rate with borderline IVCD with LAD.  No acute ST changes.  She did have edema on CXR.    She reports that she had chest pain Saturday.  This was not like her previous angina.  She had a dull type of discomfort in the mid/lower chest.  It was severe.  It happened at rest and she could not make it better or worse with activity, movement or position.  She took NTG without help.  She came the the ED and was given fentanyl and has had no pain since then.  EKG did not demonstrate acute changes.  She has had atrial fib with a rapid rate.    Of note she says that she has not been having chest pain prior to this.  She has had labile BPs.  It has been lower recently and she has had some low BPs during dialysis but they have not had to hold dialysis.  She has had no presyncope or syncope.  She has  had some dyspnea with exertion at home but she is not describing new PND or orthopnea.  She does not notice her atrial fib.    Chest pain dull sat  At rest    Severe  Mid chest aano radiation  Couldnot reilive ntg did not help    Fentanyl in the ed.  Dialysis ok sat      BP has been low on dialysis     TWO PILLOWS  NO CHANGE     Past Medical History:  Diagnosis Date  . Anemia    History  of Blood transfusion  . Asthma    PMH  . Atrial fibrillation with RVR ([place]) 11/2016  . CAD (coronary artery disease)    stent to RCA  . Cancer ([place])    clear cell cancer, kidney  . Complication of anesthesia 12/2010   pt is very confused, with AMS with anesthesia  . CVA (cerebral infarction) 2003   no apparent residual  . Dyslipidemia   . Encephalopathy   . ESRD 07/27/2008   ESRD due to HTN and NSAID's, started hemodialysis in 2005 in [place], [place]. Went to [facility] from 2010 to 2012 and since 2012 has been getting dialysis at [facility] on [address] in [place] on a MWF schedule.  First access with RUA AVG placed in [place]. Next and current access was LUA AVG placed by Dr. Lucky Cowboy in [place] in or around 2012. Has had 2 or 3 procedures on that graft since placed per family. She gets her access work done here in [place] now. She is allergic to heparin and does not get any heparin at dialysis; she had an allergic reaction apparently when in ICU in the past.      . GERD (gastroesophageal reflux disease)   . Hyperlipidemia   . Hypertension   . Hypothyroidism   . Myocardial infarction ([place])    per pt  . Positive PPD    completed rifampin  . Pulmonary hypertension ([place])   . PVD (peripheral vascular disease) ([place])   . Seizures ([place])    last seizure 6 years ago  . Sleep apnea    Sleep Study 2008  . Traumatic seroma of left lower leg ([place])   . Wears partial dentures     Past Surgical History:  Procedure Laterality Date  . ABDOMINAL AORTOGRAM W/LOWER EXTREMITY Bilateral 06/15/2016     Procedure: Abdominal Aortogram w/Lower Extremity;  Surgeon: Conrad [place], MD;  Location: [place] CV LAB;  Service: Cardiovascular;  Laterality: Bilateral;  . ABDOMINAL HYSTERECTOMY    . APPENDECTOMY    . APPLICATION OF WOUND VAC Left 09/12/2016   Procedure: APPLICATION OF WOUND VAC;  Surgeon: Conrad Wolfforth, MD;  Location: [place];  Service: Vascular;  Laterality: Left;  . ARTERY EXPLORATION Left 07/25/2016   Procedure: ARTERY EXPLORATION LEFT ABOVE KNEE POPLITEAL AND GROIN;  Surgeon: Conrad Ponce, MD;  Location: [place];  Service: Vascular;  Laterality: Left;  . AV FISTULA PLACEMENT Left 04/10/2017   Procedure: INSERTION OF ARTERIOVENOUS (AV) GORE-TEX GRAFT REDO LEFT UPPER ARM;  Surgeon: Conrad [place], MD;  Location: [place];  Service: Vascular;  Laterality: Left;  . CARDIAC CATHETERIZATION     last 2016  . CHOLECYSTECTOMY    . COLONOSCOPY    . D&Cs    . HEMATOMA EVACUATION Left 09/12/2016   Procedure: EVACUATION HEMATOMA;  Surgeon: Conrad [place], MD;  Location: [place];  Service: Vascular;  Laterality: Left;  . INSERTION OF DIALYSIS CATHETER Right 04/10/2017   Procedure: INSERTION OF DIALYSIS CATHETER;  Surgeon: Conrad Strong, MD;  Location[facility] OR;  Service: Vascular;  Laterality: Right;  . IR RADIOLOGIST EVAL & MGMT  03/13/2017  . LEFT HEART CATH AND CORONARY ANGIOGRAPHY N/A 11/22/2016   Procedure: LEFT HEART CATH AND CORONARY ANGIOGRAPHY;  Surgeon: Belva Crome, MD;  Location: [place] CV LAB;  Service: Cardiovascular;  Laterality: N/A;  . LEFT HEART CATHETERIZATION WITH CORONARY ANGIOGRAM N/A 02/22/2011   Procedure: LEFT HEART CATHETERIZATION WITH CORONARY ANGIOGRAM;  Surgeon: Wellington Hampshire, MD;  Location: [place] CATH LAB;  Service: Cardiovascular;  Laterality: N/A;  . left nephrectomy    . MULTIPLE TOOTH EXTRACTIONS    . NEPHRECTOMY Left    Malignant tumor  . PERIPHERAL VASCULAR CATHETERIZATION N/A 12/31/2014   Procedure: Abdominal Aortogram;  Surgeon: Conrad [place], MD;  Location: [place] CV LAB;  Service: Cardiovascular;  Laterality: N/A;  . right ankle repair    . RIGHT HEART CATHETERIZATION N/A 01/29/2014   Procedure: RIGHT HEART CATH;  Surgeon: Larey Dresser, MD;  Location: [facility] CATH LAB;  Service: Cardiovascular;  Laterality: N/A;  . TONSILLECTOMY    . TUBAL LIGATION    . ULTRASOUND GUIDANCE FOR VASCULAR ACCESS  11/22/2016  Procedure: Ultrasound Guidance For Vascular Access;  Surgeon: Belva Crome, MD;  Location: [place] CV LAB;  Service: Cardiovascular;;  . UPPER EXTREMITY VENOGRAPHY Bilateral 03/22/2017   Procedure: UPPER EXTREMITY VENOGRAPHY;  Surgeon: Conrad [place], MD;  Location: [place] CV LAB;  Service: Cardiovascular;  Laterality: Bilateral;  bilateral  . VASCULAR SURGERY  11/2010   graft inserted to left arm    Allergies  Allergen Reactions  . Ace Inhibitors Anaphylaxis and Rash  . Heparin Other (See Comments)    MDs told her not to take after reaction in ICU  . Iohexol Swelling and Other (See Comments)    1970s; passed out and had facial/tongue swelling.  Requires 13-hour prep with prednisone and benadryl  . Phenytoin Other (See Comments)    Had reaction while in ICU; doesn't know.  MDs told her not to take ever again.  . Wellbutrin [Bupropion] Other (See Comments)    seizures  . Meperidine Hcl Swelling and Rash    Makes tongue swell  . Morphine Rash  . Penicillins Hives and Rash    Has patient had a PCN reaction causing immediate rash, facial/tongue/throat swelling, SOB or lightheadedness with hypotension: Yes Has patient had a PCN reaction causing severe rash involving mucus membranes or skin necrosis: No Has patient had a PCN reaction that required hospitalization: No Has patient had a PCN reaction occurring within the last 10 years: No If all of the above answers are "NO", then may proceed with Cephalosporin use.    . Valproic Acid And Related Other (See Comments)    Confusion   . Iodinated Diagnostic Agents Other (See  Comments)    Pre-meditate with benadryl and prednisone 3 times before appt.  . Pentazocine Lactate Other (See Comments)    Patient does not remember reaction to this med ([place]).    Medications Prior to Admission  Medication Sig Dispense Refill Last Dose  . acetaminophen (TYLENOL) 500 MG tablet Take 1,000 mg by mouth daily as needed for mild pain.    04/12/2017  . albuterol (PROVENTIL HFA;VENTOLIN HFA) 108 (90 Base) MCG/ACT inhaler Inhale 1-2 puffs into the lungs every 6 (six) hours as needed for wheezing or shortness of breath. 1 Inhaler 0 unk at prn  . carvedilol (COREG) 25 MG tablet Take 25 mg by mouth 2 (two) times daily with a meal.   04/14/2017 at 01000  . cilostazol (PLETAL) 100 MG tablet Take 1 tablet (100 mg total) by mouth 2 (two) times daily before a meal. 60 tablet 11 04/14/2017 at Unknown time  . diltiazem (CARDIZEM CD) 120 MG 24 hr capsule Take 1 capsule (120 mg total) by mouth daily. 30 capsule 11 04/14/2017 at Unknown time  . diphenhydrAMINE (BENADRYL) 50 MG capsule Take 1 capsule (50 mg total) by mouth every Monday, Wednesday, and Friday. On dialysis days   04/13/2017 at Unknown time  . enoxaparin (LOVENOX) 80 MG/0.8ML injection Inject 0.8 mLs (80 mg total) into the skin daily. 10 Syringe 0 04/13/2017 at Unknown time  . folic acid-vitamin b complex-vitamin c-selenium-zinc (DIALYVITE) 3 MG TABS tablet Take 1 tablet by mouth daily.   04/13/2017 at Unknown time  . hydrALAZINE (APRESOLINE) 25 MG tablet Take 25 mg by mouth 3 (three) times daily.    04/14/2017 at 01000  . HYDROcodone-acetaminophen (NORCO) 5-325 MG tablet Take 1 tablet by mouth every 6 (six) hours as needed for moderate pain. 10 tablet 0 04/13/2017 at Unknown time  . isosorbide dinitrate (ISORDIL) 20 MG tablet Take  1 tablet (20 mg total) by mouth 3 (three) times daily. 270 tablet 3 04/14/2017 at 01000  . LETAIRIS 5 MG tablet TAKE 1 TABLET (5MG ) BY MOUTH DAILY. DO NOT HANDLE IF PREGNANT. DO NOTSPLIT, CRUSH, OR CHEW. AVOID  INHALATION AND CONTACT WITH SKIN OR EYES. 30 tablet 11 04/14/2017 at Unknown time  . levETIRAcetam (KEPPRA) 500 MG tablet Take 500 mg by mouth daily at 8 pm.   04/13/2017 at Unknown time  . levothyroxine (SYNTHROID, LEVOTHROID) 150 MCG tablet Take 150 mcg by mouth daily before breakfast.   04/14/2017 at Unknown time  . nitroGLYCERIN (NITROSTAT) 0.4 MG SL tablet Place 1 tablet (0.4 mg total) under the tongue every 5 (five) minutes as needed for chest pain. 25 tablet 1 04/14/2017 at Unknown time  . omeprazole (PRILOSEC OTC) 20 MG tablet Take 20 mg by mouth daily.   04/14/2017 at Unknown time  . pravastatin (PRAVACHOL) 40 MG tablet Take 1 tablet (40 mg total) by mouth at bedtime. 90 tablet 3 04/13/2017 at Unknown time  . RENVELA 800 MG tablet Take 1,600 mg by mouth 3 (three) times daily with meals. May take 1600 mg with each snack   04/14/2017 at Unknown time  . SENSIPAR 90 MG tablet Take 90 mg by mouth every Monday, Wednesday, and Friday with hemodialysis.    04/13/2017 at Unknown time  . traZODone (DESYREL) 50 MG tablet Take 50 mg by mouth at bedtime as needed for sleep.    Past Week at Unknown time  . warfarin (COUMADIN) 7.5 MG tablet Take 1/2 to 1 tablet daily as directed by coumadin clinic 30 tablet 2 04/14/2017 at 01000   Family History  Problem Relation Age of Onset  . Heart disease Father   . Hypertension Mother   . Dementia Mother   . Coronary artery disease Sister   . Heart attack Sister   . Hypertension Brother     Social History   Socioeconomic History  . Marital status: Divorced    Spouse name: Not on file  . Number of children: 2  . Years of education: 17  . Highest education level: Not on file  Occupational History  . Occupation: Retired Optician, dispensing: RETIRED  Social Needs  . Financial resource strain: Not on file  . Food insecurity:    Worry: Not on file    Inability: Not on file  . Transportation needs:    Medical: Not on file    Non-medical: Not on file  Tobacco Use    . Smoking status: Former Smoker    Packs/day: 0.00    Years: 53.00    Pack years: 0.00    Types: Cigarettes  . Smokeless tobacco: Never Used  . Tobacco comment: quit smoking cigarettes in January 2019  Substance and Sexual Activity  . Alcohol use: Yes    Alcohol/week: 0.0 oz    Comment: very rarely per pt  . Drug use: No    Comment: former marijuana use, several years  . Sexual activity: Not Currently    Birth control/protection: Post-menopausal  Lifestyle  . Physical activity:    Days per week: Not on file    Minutes per session: Not on file  . Stress: Not on file  Relationships  . Social connections:    Talks on phone: Not on file    Gets together: Not on file    Attends religious service: Not on file    Active member of club or organization: Not on file  Attends meetings of clubs or organizations: Not on file    Relationship status: Not on file  . Intimate partner violence:    Fear of current or ex partner: Not on file    Emotionally abused: Not on file    Physically abused: Not on file    Forced sexual activity: Not on file  Other Topics Concern  . Not on file  Social History Narrative  . Not on file     ROS:  As stated in the HPI and negative for all other systems.  Physical Exam: Blood pressure (!) 93/51, pulse (!) 105, temperature 97.8 F (36.6 C), temperature source Oral, resp. rate 18, height 5' 2.5" (1.588 m), weight 169 lb 4.8 oz (76.8 kg), SpO2 96 %.  GENERAL:  Well appearing HEENT:  Pupils equal round and reactive, fundi not visualized, oral mucosa unremarkable NECK:  No jugular venous distention, waveform within normal limits, carotid upstroke brisk and symmetric, no bruits, no thyromegaly LYMPHATICS:  No cervical, inguinal adenopathy LUNGS:  Clear to auscultation bilaterally BACK:  No CVA tenderness CHEST:  Unremarkable HEART:  PMI not displaced or sustained,S1 and S2 within normal limits, no S3, no S4, no clicks, no rubs, 2/6 systolic murmur at  the apex without diastolic murmurs ABD:  Flat, positive bowel sounds normal in frequency in pitch, no bruits, no rebound, no guarding, no midline pulsatile mass, no hepatomegaly, no splenomegaly EXT:  2 plus pulses throughout, no edema, no cyanosis no clubbing, left upper arm fistula.  SKIN:  No rashes no nodules NEURO:  Cranial nerves II through XII grossly intact, motor grossly intact throughout PSYCH:  Cognitively intact, oriented to person place and time   Labs: Lab Results  Component Value Date   BUN 18 04/15/2017   Lab Results  Component Value Date   CREATININE 4.56 (H) 04/15/2017   Lab Results  Component Value Date   NA 136 04/15/2017   K 4.0 04/15/2017   CL 99 (L) 04/15/2017   CO2 26 04/15/2017   Lab Results  Component Value Date   TROPONINI 0.88 (HH) 04/15/2017   Lab Results  Component Value Date   WBC 3.9 (L) 04/15/2017   HGB 9.4 (L) 04/15/2017   HCT 28.8 (L) 04/15/2017   MCV 82.5 04/15/2017   PLT 120 (L) 04/15/2017   Lab Results  Component Value Date   CHOL 104 04/15/2017   HDL 42 04/15/2017   LDLCALC 51 04/15/2017   TRIG 54 04/15/2017   CHOLHDL 2.5 04/15/2017   Lab Results  Component Value Date   ALT 11 (L) 07/21/2016   AST 16 07/21/2016   ALKPHOS 88 07/21/2016   BILITOT 0.4 07/21/2016      Radiology:   CXR  IMPRESSION: Stable diffuse interstitial prominence is noted which may represent chronic scarring, but superimposed acute edema or inflammation cannot be excluded.:     EKG:  Atrial fib, rate 123, LAD, low voltage limb leads, poor anterior R wave progress.    ASSESSMENT AND PLAN:   CHEST PAIN:  Troponin is elevated.  However, no acute EKG changes on admission and in this clinical scenario the trend is not diagnostic.  I did review the cath films personally from Nov of last year.  I would not suspect that we would find anything different at this time with another cath.  Continue medical management.      HTN:  BPs up and down.   Will  adjust meds as below.    ATRIAL FIB WITH  RVR:  Her BP has been labile which makes med adjustments more difficult.  I see that her Coreg here has been 12.5 mg and at home it was 25.  I don't see that any doses have been held.  My first step will be to increase the Coreg to 25 mg bid and continue current dose of Dilt.   It is likely that we will need to hold the Coreg and Dilt on the morning of dialysis but that she can get the dose after dialysis.  This has been her pattern at home.    CAD:  As above  CARDIOMYOPATHY:   EF 30%.   Volume management per dialysis.     SignedMinus Breeding 04/16/2017, 2:00 PM

## 2017-04-16 NOTE — Progress Notes (Signed)
[facility] Progress Note   Subjective:   No CP/SOB today.    Objective Vitals:   04/16/17 0411 04/16/17 0530 04/16/17 0751 04/16/17 0800  BP: 126/78  134/78   Pulse: (!) 135     Resp: (!) 21 16 18    Temp: 97.8 F (36.6 C)  97.8 F (36.6 C)   TempSrc: Oral  Oral   SpO2: 92%  98% 98%  Weight:      Height:       Physical Exam General:NAD, WDWN tearful, pleasant female Heart: A fib, no mrg Lungs:CTAB Abdomen:soft, NTND Extremities:no LE edema Dialysis Access: LU AVG +b/t, R thigh TDC   Dialysis Orders: MWF [facility] 75kg   2/2 bath  P4  R thigh TDC (now removed) / maturing LUA AVG  Hep none (allergic) -Epogen 9600 Units IV qHD - Weekly dose 29518 -Sensipar 120mg  PO qHD -Calcitriol 2.36mch PO qHD  Home meds: - coreg 25 bid/ dilt 120 qd/ hydral 25 tid - sl ntg prn/ isordil 20 tid - PPI/ sensipar 90/ desyrel/ T4/ norco prn/ pletal  - coumadin/ lovenox SQ - keppra - fosrenol 100mg  AC TID, 1 BID w/snacks  Assessment/Plan: 1. Chest pain/+troponin - per primary 2. Volume excess /pulm edema - CXR changes may be chronic, haven't changed, pt not SOB, exam normal, on RA.  Pull volume as tolerated on HD.  3. R thigh TDC drainage - suspect cath infection, sp removal yest 4/14.  For new catheter tomorrow , TDC is ok as blood cx's were negative.  4. ESRD - had HD Saturday, no HD today. Next HD after new cath placed.  5. A fib w/RVR - per primary. No heparin d/t allergy, on po dilt/ coreg per cards 6. CAD - known w/RCA stent, last cath Nov 7. CM - LVEF 30% - per primary 5. Anemia of CKD- Hgb 9.4 s/p 2Unit pRBC, start epogen 9600 Unit qHD tomorrow 6. Secondary hyperparathyroidism - Ca in goal. Cont Senispar, calcitriol, and binders. 7. HTN - BP well controlled 6. Nutrition - Renal diet with fluid restrictions.  7. Seizure do - on keppra 8. S/p new LUA AVG - placed on 4/4 by Dr. Barkley Boards MD [place] pager [phone]   04/16/2017,  12:16 PM     Filed Weights   04/14/17 2331 04/15/17 0544 04/16/17 0409  Weight: 75 kg (165 lb 4.8 oz) 75.4 kg (166 lb 3.2 oz) 76.8 kg (169 lb 4.8 oz)    Intake/Output Summary (Last 24 hours) at 04/16/2017 1216 Last data filed at 04/16/2017 1100 Gross per 24 hour  Intake 633.58 ml  Output -  Net 633.58 ml    Additional Objective Labs: Basic Metabolic Panel: Recent Labs  Lab 04/10/17 0638 04/14/17 1427 04/15/17 0556 04/15/17 1031  NA 135 135 136  --   K 4.2 4.1 4.0  --   CL  --  92* 99*  --   CO2  --  30 26  --   GLUCOSE 84 104* 83  --   BUN  --  34* 18  --   CREATININE  --  6.81* 4.56*  --   CALCIUM  --  8.8* 8.7*  --   PHOS  --   --   --  4.5   CBC: Recent Labs  Lab 04/14/17 1427 04/14/17 2359 04/15/17 0556  WBC 4.5 4.4 3.9*  NEUTROABS  --  2.8  --   HGB 7.6* 10.4* 9.4*  HCT 23.8* 31.4*  28.8*  MCV 81.8 83.5 82.5  PLT 138* 122* 120*   Blood Culture    Component Value Date/Time   SDES BLOOD HEMODIALYSIS CATHETER 04/14/2017 1920   SPECREQUEST  04/14/2017 1920    BOTTLES DRAWN AEROBIC AND ANAEROBIC Blood Culture results may not be optimal due to an excessive volume of blood received in culture bottles   CULT  04/14/2017 1920    NO GROWTH < 24 HOURS Performed at [facility], [place] [address], [place], [place] [address]    REPTSTATUS PENDING 04/14/2017 1920    Cardiac Enzymes: Recent Labs  Lab 04/14/17 2359 04/15/17 0556 04/15/17 1031  TROPONINI 0.35* 0.61* 0.88*    Lab Results  Component Value Date   INR 1.61 04/15/2017   INR 1.52 04/14/2017   INR 1.39 04/10/2017   Studies/Results: Dg Chest 2 View  Result Date: 04/14/2017 CLINICAL DATA:  Chest pain today EXAM: CHEST - 2 VIEW COMPARISON:  Chest x-rays dated 04/10/2017 and 01/05/2017. FINDINGS: Stable cardiomegaly. Aortic atherosclerosis. Central catheter from femoral pro ches stable in position with tip at the expected level of the cavoatrial junction. Diffuse interstitial prominence is  stable compared to recent exam, increased compared to an earlier chest x-ray of 10/06/2013. no confluent opacity to suggest a consolidating pneumonia. No pleural effusion or pneumothorax seen. No acute or suspicious osseous finding. IMPRESSION: 1. Cardiomegaly with persistent interstitial prominence, presumed interstitial edema, compatible with chronic mild CHF. No evidence of overt alveolar pulmonary edema. No evidence of pneumonia. 2. Aortic atherosclerosis. Electronically Signed   By: Franki Cabot M.D.   On: 04/14/2017 14:59   Dg Chest Port 1 View  Result Date: 04/16/2017 CLINICAL DATA:  Pulmonary edema. EXAM: PORTABLE CHEST 1 VIEW COMPARISON:  Radiographs of April 14, 2017. FINDINGS: Stable cardiomegaly. Atherosclerosis of thoracic aorta is noted. No pneumothorax or pleural effusion is noted. Stable diffuse interstitial prominence is noted which may represent chronic scarring, but superimposed acute edema or inflammation cannot be excluded. Bony thorax is unremarkable. IMPRESSION: Stable diffuse interstitial prominence is noted which may represent chronic scarring, but superimposed acute edema or inflammation cannot be excluded. Aortic Atherosclerosis (ICD10-I70.0). Electronically Signed   By: Marijo Conception, M.D.   On: 04/16/2017 07:34    Medications: . sodium chloride    . cefTAZidime (FORTAZ)  IV    . diltiazem (CARDIZEM) infusion Stopped (04/15/17 1312)  . vancomycin     . ambrisentan  5 mg Oral Daily  . aspirin EC  81 mg Oral Daily  . carvedilol  12.5 mg Oral BID WC  . cilostazol  100 mg Oral BID AC  . diltiazem  120 mg Oral Daily  . diltiazem  30 mg Oral Q6H  . diphenhydrAMINE  50 mg Oral Q M,W,F  . enoxaparin (LOVENOX) injection  75 mg Subcutaneous QHS  . epoetin (EPOGEN/PROCRIT) injection  9,600 Units Intravenous Q M,W,F-HD  . isosorbide dinitrate  10 mg Oral TID  . lanthanum  1,000 mg Oral TID WC  . lanthanum  1,000 mg Oral BID BM  . levETIRAcetam  500 mg Oral Q2000  .  levothyroxine  150 mcg Oral QAC breakfast  . multivitamin  1 tablet Oral QHS  . pantoprazole  40 mg Oral Daily  . pravastatin  40 mg Oral QHS

## 2017-04-16 NOTE — Progress Notes (Signed)
   Daily Progress Note   Assessment/Planning:   POD #6 s/p revision of LUA AVG with new LUA AVG, TDC   TDC was removed on POD #4, so somewhat unusual to get a catheter infection this fast  Pt afebrile with acceptable WBC.  Will plan on [facility] placement tomorrow.  Pt has known history of venoplasty on LUA AVG, so some degree of venous stenosis expected.    LUA findings are c/w post-operative status  Intraoperative findings were not impressive.  Essentially, a new graft was sewn into the prior venous anastomosis with a small rim of old graft and the arterial inflow was sewn on to the distal segment of the old graft.  Pt has has already undergone DRIL with [facility], so I avoided revision on the arterial anastomosis.   Subjective   C/o chest pain   Objective   Vitals:   04/16/17 0411 04/16/17 0530 04/16/17 0751 04/16/17 0800  BP: 126/78  134/78   Pulse: (!) 135     Resp: (!) 21 16 18    Temp: 97.8 F (36.6 C)  97.8 F (36.6 C)   TempSrc: Oral  Oral   SpO2: 92%  98% 98%  Weight:      Height:         Intake/Output Summary (Last 24 hours) at 04/16/2017 0928 Last data filed at 04/16/2017 0500 Gross per 24 hour  Intake 633.58 ml  Output -  Net 633.58 ml    VASC L upper arm > forearm swollen, +bruit  RLE Bandaged, no cellulitis changes    Laboratory   CBC CBC Latest Ref Rng & Units 04/15/2017 04/14/2017 04/14/2017  WBC 4.0 - 10.5 K/uL 3.9(L) 4.4 4.5  Hemoglobin 12.0 - 15.0 g/dL 9.4(L) 10.4(L) 7.6(L)  Hematocrit 36.0 - 46.0 % 28.8(L) 31.4(L) 23.8(L)  Platelets 150 - 400 K/uL 120(L) 122(L) 138(L)    BMET    Component Value Date/Time   NA 136 04/15/2017 0556   K 4.0 04/15/2017 0556   K 4.9 01/16/2011 1503   CL 99 (L) 04/15/2017 0556   CO2 26 04/15/2017 0556   GLUCOSE 83 04/15/2017 0556   BUN 18 04/15/2017 0556   CREATININE 4.56 (H) 04/15/2017 0556   CREATININE 4.16 (H) 06/04/2015 1654   CALCIUM 8.7 (L) 04/15/2017 0556   GFRNONAA 9 (L)  04/15/2017 0556   GFRNONAA 10 (L) 06/04/2015 1654   GFRAA 10 (L) 04/15/2017 0556   GFRAA 12 (L) 06/04/2015 1654     Adele Barthel, MD, FACS [facility] Office: [phone] Pager: [phone]  04/16/2017, 9:28 AM

## 2017-04-16 NOTE — Progress Notes (Signed)
Pt HR 125-140 afib, BP 103/70. Pt denies pain, discomfort or shortness of breath at this time. PO Cardizem given early in the evening. NP Bohdenheimer with [facility] made aware, new orders received for 261ml NS bolus now and a one time dose of 2.5 IV lopressor. Will continue to monitor.

## 2017-04-17 ENCOUNTER — Inpatient Hospital Stay (HOSPITAL_COMMUNITY): Payer: Medicare Other

## 2017-04-17 ENCOUNTER — Inpatient Hospital Stay (HOSPITAL_COMMUNITY): Payer: Medicare Other | Admitting: Anesthesiology

## 2017-04-17 ENCOUNTER — Encounter (HOSPITAL_COMMUNITY)
Admission: EM | Disposition: A | Discharge status: Home Health | Payer: Self-pay | Source: Home / Self Care | Attending: Family Medicine

## 2017-04-17 DIAGNOSIS — N186 End stage renal disease: Secondary | ICD-10-CM

## 2017-04-17 HISTORY — PX: INSERTION OF DIALYSIS CATHETER: SHX1324

## 2017-04-17 LAB — CBC WITH DIFFERENTIAL/PLATELET
BASOS ABS: 0 10*3/uL (ref 0.0–0.1)
Basophils Relative: 0 %
Eosinophils Absolute: 0.2 10*3/uL (ref 0.0–0.7)
Eosinophils Relative: 5 %
HEMATOCRIT: 29.5 % — AB (ref 36.0–46.0)
Hemoglobin: 9.4 g/dL — ABNORMAL LOW (ref 12.0–15.0)
LYMPHS PCT: 28 %
Lymphs Abs: 1.2 10*3/uL (ref 0.7–4.0)
MCH: 26.7 pg (ref 26.0–34.0)
MCHC: 31.9 g/dL (ref 30.0–36.0)
MCV: 83.8 fL (ref 78.0–100.0)
Monocytes Absolute: 0.5 10*3/uL (ref 0.1–1.0)
Monocytes Relative: 11 %
NEUTROS ABS: 2.5 10*3/uL (ref 1.7–7.7)
NEUTROS PCT: 56 %
Platelets: 144 10*3/uL — ABNORMAL LOW (ref 150–400)
RBC: 3.52 MIL/uL — AB (ref 3.87–5.11)
RDW: 18.8 % — AB (ref 11.5–15.5)
WBC: 4.4 10*3/uL (ref 4.0–10.5)

## 2017-04-17 LAB — CBC
HCT: 29.3 % — ABNORMAL LOW (ref 36.0–46.0)
Hemoglobin: 9.6 g/dL — ABNORMAL LOW (ref 12.0–15.0)
MCH: 27.1 pg (ref 26.0–34.0)
MCHC: 32.8 g/dL (ref 30.0–36.0)
MCV: 82.8 fL (ref 78.0–100.0)
PLATELETS: 112 10*3/uL — AB (ref 150–400)
RBC: 3.54 MIL/uL — ABNORMAL LOW (ref 3.87–5.11)
RDW: 18.5 % — AB (ref 11.5–15.5)
WBC: 4.6 10*3/uL (ref 4.0–10.5)

## 2017-04-17 LAB — RENAL FUNCTION PANEL
ALBUMIN: 3.2 g/dL — AB (ref 3.5–5.0)
ALBUMIN: 3.1 g/dL — AB (ref 3.5–5.0)
ANION GAP: 16 — AB (ref 5–15)
Anion gap: 15 (ref 5–15)
BUN: 45 mg/dL — ABNORMAL HIGH (ref 6–20)
BUN: 44 mg/dL — ABNORMAL HIGH (ref 6–20)
CALCIUM: 9 mg/dL (ref 8.9–10.3)
CALCIUM: 8.9 mg/dL (ref 8.9–10.3)
CO2: 21 mmol/L — ABNORMAL LOW (ref 22–32)
CO2: 22 mmol/L (ref 22–32)
CREATININE: 8.29 mg/dL — AB (ref 0.44–1.00)
Chloride: 97 mmol/L — ABNORMAL LOW (ref 101–111)
Chloride: 95 mmol/L — ABNORMAL LOW (ref 101–111)
Creatinine, Ser: 8.33 mg/dL — ABNORMAL HIGH (ref 0.44–1.00)
GFR calc non Af Amer: 4 mL/min — ABNORMAL LOW (ref >60)
GFR, EST AFRICAN AMERICAN: 5 mL/min — AB (ref >60)
GFR, EST AFRICAN AMERICAN: 5 mL/min — AB (ref >60)
GFR, EST NON AFRICAN AMERICAN: 4 mL/min — AB (ref >60)
GLUCOSE: 103 mg/dL — AB (ref 65–99)
Glucose, Bld: 86 mg/dL (ref 65–99)
PHOSPHORUS: 6.1 mg/dL — AB (ref 2.5–4.6)
POTASSIUM: 4.5 mmol/L (ref 3.5–5.1)
Phosphorus: 6.2 mg/dL — ABNORMAL HIGH (ref 2.5–4.6)
Potassium: 4.4 mmol/L (ref 3.5–5.1)
SODIUM: 134 mmol/L — AB (ref 135–145)
SODIUM: 132 mmol/L — AB (ref 135–145)

## 2017-04-17 LAB — SURGICAL PCR SCREEN
MRSA, PCR: NEGATIVE
Staphylococcus aureus: NEGATIVE

## 2017-04-17 LAB — TROPONIN I: Troponin I: 0.33 ng/mL (ref <0.03)

## 2017-04-17 SURGERY — INSERTION OF DIALYSIS CATHETER
Anesthesia: General | Laterality: Left

## 2017-04-17 MED ORDER — RANOLAZINE ER 500 MG PO TB12
500.0000 mg | ORAL_TABLET | Freq: Two times a day (BID) | ORAL | Status: DC
  Administered 2017-04-17 – 2017-04-19 (×4): 500 mg via ORAL
  Filled 2017-04-17 (×5): qty 1

## 2017-04-17 MED ORDER — FENTANYL CITRATE (PF) 100 MCG/2ML IJ SOLN: 25.0000 ug | INTRAMUSCULAR | Status: DC | PRN

## 2017-04-17 MED ORDER — ALTEPLASE 2 MG IJ SOLR: 2.0000 mg | Freq: Once | INTRAMUSCULAR | Status: DC | PRN

## 2017-04-17 MED ORDER — DEXAMETHASONE SODIUM PHOSPHATE 10 MG/ML IJ SOLN
INTRAMUSCULAR | Status: DC | PRN
  Administered 2017-04-17: 10 mg via INTRAVENOUS

## 2017-04-17 MED ORDER — ENOXAPARIN SODIUM 80 MG/0.8ML ~~LOC~~ SOLN: 80.0000 mg | Freq: Every day | SUBCUTANEOUS | Status: DC

## 2017-04-17 MED ORDER — SEVELAMER CARBONATE 800 MG PO TABS
1600.0000 mg | ORAL_TABLET | Freq: Three times a day (TID) | ORAL | Status: DC
  Administered 2017-04-17 – 2017-04-19 (×6): 1600 mg via ORAL
  Filled 2017-04-17 (×6): qty 2

## 2017-04-17 MED ORDER — LIDOCAINE-PRILOCAINE 2.5-2.5 % EX CREA
1.0000 "application " | TOPICAL_CREAM | CUTANEOUS | Status: DC | PRN
  Filled 2017-04-17: qty 5

## 2017-04-17 MED ORDER — 0.9 % SODIUM CHLORIDE (POUR BTL) OPTIME
TOPICAL | Status: DC | PRN
  Administered 2017-04-17: 1000 mL

## 2017-04-17 MED ORDER — ANTICOAGULANT SODIUM CITRATE 4% (200MG/5ML) IV SOLN
10.0000 mL | Freq: Once | Status: DC
  Filled 2017-04-17 (×2): qty 250

## 2017-04-17 MED ORDER — SODIUM CHLORIDE 0.9 % IV SOLN
2.0000 g | Freq: Once | INTRAVENOUS | Status: DC
  Filled 2017-04-17 (×2): qty 2

## 2017-04-17 MED ORDER — PHENYLEPHRINE HCL 10 MG/ML IJ SOLN
INTRAVENOUS | Status: DC | PRN
  Administered 2017-04-17: 75 ug/min via INTRAVENOUS

## 2017-04-17 MED ORDER — MORPHINE SULFATE (PF) 4 MG/ML IV SOLN
1.0000 mg | Freq: Once | INTRAVENOUS | Status: AC
  Administered 2017-04-17: 1 mg via INTRAVENOUS

## 2017-04-17 MED ORDER — ONDANSETRON HCL 4 MG/2ML IJ SOLN: 4.0000 mg | Freq: Once | INTRAMUSCULAR | Status: DC | PRN

## 2017-04-17 MED ORDER — FENTANYL CITRATE (PF) 250 MCG/5ML IJ SOLN
INTRAMUSCULAR | Status: DC | PRN
  Administered 2017-04-17 (×2): 50 ug via INTRAVENOUS

## 2017-04-17 MED ORDER — MUPIROCIN 2 % EX OINT
TOPICAL_OINTMENT | Freq: Two times a day (BID) | CUTANEOUS | Status: DC
  Administered 2017-04-17 – 2017-04-19 (×4): via NASAL
  Filled 2017-04-17: qty 22

## 2017-04-17 MED ORDER — SODIUM CHLORIDE 0.9 % IV SOLN: 100.0000 mL | INTRAVENOUS | Status: DC | PRN

## 2017-04-17 MED ORDER — PENTAFLUOROPROP-TETRAFLUOROETH EX AERO: 1.0000 "application " | INHALATION_SPRAY | CUTANEOUS | Status: DC | PRN

## 2017-04-17 MED ORDER — ONDANSETRON HCL 4 MG/2ML IJ SOLN
INTRAMUSCULAR | Status: DC | PRN
  Administered 2017-04-17: 4 mg via INTRAVENOUS

## 2017-04-17 MED ORDER — LIDOCAINE HCL (PF) 1 % IJ SOLN
INTRAMUSCULAR | Status: DC | PRN
  Administered 2017-04-17: 30 mL

## 2017-04-17 MED ORDER — PROPOFOL 10 MG/ML IV BOLUS
INTRAVENOUS | Status: DC | PRN
  Administered 2017-04-17: 130 mg via INTRAVENOUS

## 2017-04-17 MED ORDER — LIDOCAINE HCL (PF) 1 % IJ SOLN: 5.0000 mL | INTRAMUSCULAR | Status: DC | PRN

## 2017-04-17 MED ORDER — LIDOCAINE HCL (CARDIAC) 20 MG/ML IV SOLN
INTRAVENOUS | Status: DC | PRN
  Administered 2017-04-17: 75 mg via INTRATRACHEAL

## 2017-04-17 MED ORDER — FENTANYL CITRATE (PF) 250 MCG/5ML IJ SOLN
INTRAMUSCULAR | Status: AC
  Filled 2017-04-17: qty 5

## 2017-04-17 MED ORDER — MORPHINE SULFATE (PF) 4 MG/ML IV SOLN
INTRAVENOUS | Status: AC
  Administered 2017-04-17: 1 mg via INTRAVENOUS
  Filled 2017-04-17: qty 1

## 2017-04-17 MED ORDER — MORPHINE SULFATE (PF) 4 MG/ML IV SOLN
1.0000 mg | INTRAVENOUS | Status: DC | PRN
  Administered 2017-04-17 (×2): 1 mg via INTRAVENOUS
  Filled 2017-04-17: qty 1

## 2017-04-17 MED ORDER — LIDOCAINE HCL (PF) 1 % IJ SOLN
INTRAMUSCULAR | Status: AC
  Filled 2017-04-17: qty 30

## 2017-04-17 MED ORDER — DILTIAZEM HCL 60 MG PO TABS
60.0000 mg | ORAL_TABLET | Freq: Four times a day (QID) | ORAL | Status: DC
  Administered 2017-04-17 – 2017-04-19 (×6): 60 mg via ORAL
  Filled 2017-04-17 (×6): qty 1

## 2017-04-17 MED ORDER — MORPHINE SULFATE (PF) 4 MG/ML IV SOLN
INTRAVENOUS | Status: AC
  Filled 2017-04-17: qty 1

## 2017-04-17 MED ORDER — VANCOMYCIN HCL IN DEXTROSE 1-5 GM/200ML-% IV SOLN
INTRAVENOUS | Status: AC
  Filled 2017-04-17: qty 200

## 2017-04-17 MED ORDER — VANCOMYCIN HCL 1000 MG IV SOLR
INTRAVENOUS | Status: DC | PRN
  Administered 2017-04-17: 1000 mg via INTRAVENOUS

## 2017-04-17 MED ORDER — ENOXAPARIN SODIUM 80 MG/0.8ML ~~LOC~~ SOLN
80.0000 mg | Freq: Every day | SUBCUTANEOUS | Status: DC
  Administered 2017-04-18: 80 mg via SUBCUTANEOUS
  Filled 2017-04-17: qty 0.8

## 2017-04-17 MED ORDER — SEVELAMER CARBONATE 800 MG PO TABS: 800.0000 mg | ORAL_TABLET | Freq: Two times a day (BID) | ORAL | Status: DC

## 2017-04-17 SURGICAL SUPPLY — 47 items
BAG DECANTER FOR FLEXI CONT (MISCELLANEOUS) ×3 IMPLANT
BIOPATCH RED 1 DISK 7.0 (GAUZE/BANDAGES/DRESSINGS) ×2 IMPLANT
BIOPATCH RED 1IN DISK 7.0MM (GAUZE/BANDAGES/DRESSINGS) ×1
CATH PALINDROME RT-P 15FX19CM (CATHETERS) IMPLANT
CATH PALINDROME RT-P 15FX23CM (CATHETERS) IMPLANT
CATH PALINDROME RT-P 15FX28CM (CATHETERS) IMPLANT
CATH PALINDROME RT-P 15FX55CM (CATHETERS) ×2 IMPLANT
CATH STRAIGHT 5FR 65CM (CATHETERS) ×2 IMPLANT
COVER PROBE W GEL 5X96 (DRAPES) ×3 IMPLANT
COVER SURGICAL LIGHT HANDLE (MISCELLANEOUS) ×3 IMPLANT
DECANTER SPIKE VIAL GLASS SM (MISCELLANEOUS) ×1 IMPLANT
DERMABOND ADVANCED (GAUZE/BANDAGES/DRESSINGS) ×2
DERMABOND ADVANCED .7 DNX12 (GAUZE/BANDAGES/DRESSINGS) ×1 IMPLANT
DRAPE C-ARM 42X72 X-RAY (DRAPES) ×3 IMPLANT
DRAPE CHEST BREAST 15X10 FENES (DRAPES) ×3 IMPLANT
GAUZE SPONGE 4X4 16PLY XRAY LF (GAUZE/BANDAGES/DRESSINGS) ×3 IMPLANT
GLOVE BIO SURGEON STRL SZ 6.5 (GLOVE) ×1 IMPLANT
GLOVE BIO SURGEON STRL SZ7 (GLOVE) ×3 IMPLANT
GLOVE BIO SURGEONS STRL SZ 6.5 (GLOVE) ×1
GLOVE BIOGEL PI IND STRL 6.5 (GLOVE) IMPLANT
GLOVE BIOGEL PI IND STRL 7.5 (GLOVE) ×1 IMPLANT
GLOVE BIOGEL PI INDICATOR 6.5 (GLOVE) ×2
GLOVE BIOGEL PI INDICATOR 7.5 (GLOVE) ×2
GOWN STRL REUS W/ TWL LRG LVL3 (GOWN DISPOSABLE) ×2 IMPLANT
GOWN STRL REUS W/TWL LRG LVL3 (GOWN DISPOSABLE) ×4
KIT BASIN OR (CUSTOM PROCEDURE TRAY) ×3 IMPLANT
KIT TURNOVER KIT B (KITS) ×3 IMPLANT
NDL 18GX1X1/2 (RX/OR ONLY) (NEEDLE) ×1 IMPLANT
NDL HYPO 25GX1X1/2 BEV (NEEDLE) ×1 IMPLANT
NEEDLE 18GX1X1/2 (RX/OR ONLY) (NEEDLE) ×3 IMPLANT
NEEDLE HYPO 25GX1X1/2 BEV (NEEDLE) ×3 IMPLANT
NS IRRIG 1000ML POUR BTL (IV SOLUTION) ×3 IMPLANT
PACK SURGICAL SETUP 50X90 (CUSTOM PROCEDURE TRAY) ×3 IMPLANT
PAD ARMBOARD 7.5X6 YLW CONV (MISCELLANEOUS) ×6 IMPLANT
SET MICROPUNCTURE 5F STIFF (MISCELLANEOUS) IMPLANT
SOAP 2 % CHG 4 OZ (WOUND CARE) ×3 IMPLANT
SUT ETHILON 3 0 PS 1 (SUTURE) ×3 IMPLANT
SUT MNCRL AB 4-0 PS2 18 (SUTURE) ×3 IMPLANT
SYR 10ML LL (SYRINGE) ×3 IMPLANT
SYR 20CC LL (SYRINGE) ×6 IMPLANT
SYR 3ML LL SCALE MARK (SYRINGE) ×3 IMPLANT
SYR 5ML LL (SYRINGE) ×3 IMPLANT
SYR CONTROL 10ML LL (SYRINGE) ×3 IMPLANT
TOWEL GREEN STERILE (TOWEL DISPOSABLE) ×3 IMPLANT
TOWEL GREEN STERILE FF (TOWEL DISPOSABLE) ×3 IMPLANT
WATER STERILE IRR 1000ML POUR (IV SOLUTION) ×3 IMPLANT
WIRE AMPLATZ SS-J .035X180CM (WIRE) ×2 IMPLANT

## 2017-04-17 NOTE — Op Note (Signed)
OPERATIVE NOTE  PROCEDURE: 1. left femoral vein tunneled dialysis catheter placement 2. left} femoral vein cannulation under ultrasound guidance  PRE-OPERATIVE DIAGNOSIS: hemodialysis dependence  POST-OPERATIVE DIAGNOSIS: same as above  SURGEON: Adele Barthel, MD  ANESTHESIA: local and general  ESTIMATED BLOOD LOSS: 30 cc  FINDING(S): 1.  Tips of the catheter in the right atrium on fluoroscopy  SPECIMEN(S):  none  INDICATIONS:   [name] is a 72 y.o. female who  presents with hemodialysis dependence.  The patient presents for tunneled dialysis catheter placement.  The patient is aware the risks of tunneled dialysis catheter placement include but are not limited to: bleeding, infection, central venous injury, pneumothorax, possible venous stenosis, possible malpositioning in the venous system, and possible infections related to long-term catheter presence.  The patient was aware of these risks and agreed to proceed.   DESCRIPTION: After written full informed consent was obtained from the patient, the patient was taken back to the operating room.  Prior to induction, the patient was given IV antibiotics.  After obtaining adequate sedation, the patient was prepped and draped in the standard fashion for a femoral vein tunneled dialysis catheter placement.    The cannulation site, the catheter exit site, and tract for the subcutaneous tunnel were then anesthesized with a total of 10 cc of 1% lidocaine without epinephrine.  Under ultrasound guidance, the left femoral vein was cannulated with a 18 gauge needle.  A J-wire was then placed into the right atrium under fluroscopic guidance.  The wire was then secured in place with a clamp to the drapes.    I then made stab incisions at the cannulation and exit sites.  I dissected from the exit site to the cannulation site with a metal dissector.  The wire was then unclamped and I removed the needle.  I tried to pass the small dilator but  it would not track easily over the J-wire.  Using 5-Fr straight catheter, I exchanged the wire for an Amplatz wire.  The catheter was removed.  The skin tract and venotomy was dilated serially with graduated dilators over the Amplatz wire.  Finally, the dilator-sheath was placed under fluroscopic guidance into the iliac vein.  The dilator and wire were removed.    A 55 cm Palindrome catheter was placed under fluoroscopic guidance into the right atrium.  The wire was removed.  The sheath was broken and peeled away while holding the catheter cuff at the level of the skin.  The catheter was clamped with the plastic clamp.  The back of the catheter was transected revealing the two lumen.  The dissector was docked onto one of these lumens.  The dissector with the back end of this catheter was pulled through the subcutaneous tunnel.  The catheter was again clamped and the back end of the catheter was transected again to reveal the two lumens again.  The catheter collar was placed over the catheter and then two ports loaded onto the two lumens.  The catheter collar was snapped into place, securing the two ports.  The catheter was pulled into appropriate position under fluoroscopy.  Each port was tested by aspirating and flushing.  No resistance was noted.  Each port was then thoroughly flushed with heparinized saline.  The catheter was secured in placed with two interrupted stitches of 3-0 Nylon tied to the catheter.  The cannulation incision was closed with a U-stitch of 4-0 Monocryl.  The cannulation and exit incisions were cleaned and sterile  bandages applied.  Each port was then loaded with concentrated heparin (1000 Units/mL) at the manufacturer recommended volumes to each port.  Sterile caps were applied to each port.    On completion fluoroscopy, the tips of the catheter were in the right atrium, and there was no evidence of pneumothorax.   COMPLICATIONS: none  CONDITION: stable   Adele Barthel, MD,  [facility] [place] Office: [phone] Pager: [phone]  04/17/2017, 8:18 AM

## 2017-04-17 NOTE — Transfer of Care (Signed)
Immediate Anesthesia Transfer of Care Note  Patient: [name]  Procedure(s) Performed: INSERTION OF DIALYSIS CATHETER LEFT GROIN (Left )  Patient Location: PACU  Anesthesia Type:General  Level of Consciousness: awake, alert  and oriented  Airway & Oxygen Therapy: Patient Spontanous Breathing and Patient connected to nasal cannula oxygen  Post-op Assessment: Report given to RN, Post -op Vital signs reviewed and stable and Patient moving all extremities X 4  Post vital signs: Reviewed and stable  Last Vitals:  Vitals Value Taken Time  BP    Temp    Pulse    Resp    SpO2      Last Pain:  Vitals:   04/17/17 0517  TempSrc: Oral  PainSc:       Patients Stated Pain Goal: 0 (47/09/29 5747)  Complications: No apparent anesthesia complications

## 2017-04-17 NOTE — Progress Notes (Addendum)
Called by RN on HD  Having central CP--giving Morphine x 1 EKg shows slight peak of Twave-afib   Patient has changed code status to DNAR Informed Dr. Percival Spanish by text Getting troponin x1 Adding Ranexa 500 bid [d/w nephro-no contraindication] Cont Lovenox Rx dosing for ACS no likely further work-up--did inform daughter of change in status  Might need palliative input if no better in am Ok to transfer back to 6e  Verneita Griffes, MD [facility] [phone]

## 2017-04-17 NOTE — Progress Notes (Addendum)
Progress Note  Patient Name: [name] Date of Encounter: 04/17/2017  Primary Cardiologist: Minus Breeding, MD   Subjective   Just returned from OR after insertion of dialysis catheter by vascular surgery. Current asymptomatic. She denies CP. No palpitations.   Inpatient Medications    Scheduled Meds: . ambrisentan  5 mg Oral Daily  . aspirin EC  81 mg Oral Daily  . carvedilol  25 mg Oral BID WC  . cilostazol  100 mg Oral BID AC  . diltiazem  120 mg Oral Daily  . diltiazem  30 mg Oral Q6H  . diphenhydrAMINE  50 mg Oral Q M,W,F  . enoxaparin (LOVENOX) injection  75 mg Subcutaneous QHS  . epoetin (EPOGEN/PROCRIT) injection  9,600 Units Intravenous Q M,W,F-HD  . isosorbide dinitrate  10 mg Oral TID  . lanthanum  1,000 mg Oral TID WC  . lanthanum  1,000 mg Oral BID BM  . levETIRAcetam  500 mg Oral Q2000  . levothyroxine  150 mcg Oral QAC breakfast  . multivitamin  1 tablet Oral QHS  . mupirocin ointment   Nasal BID  . pantoprazole  40 mg Oral Daily  . pravastatin  40 mg Oral QHS   Continuous Infusions: . sodium chloride    . sodium chloride    . cefTAZidime (FORTAZ)  IV    . vancomycin     PRN Meds: sodium chloride, sodium chloride, acetaminophen, albuterol, alteplase, calcitRIOL, cinacalcet, HYDROcodone-acetaminophen, lidocaine (PF), lidocaine-prilocaine, nitroGLYCERIN, ondansetron (ZOFRAN) IV, pentafluoroprop-tetrafluoroeth, traZODone   Vital Signs    Vitals:   04/17/17 0900 04/17/17 0915 04/17/17 0930 04/17/17 1100  BP: 110/69 113/76 112/62   Pulse: (!) 110 (!) 116  (!) 110  Resp: _0 (!) 23  Temp:      TempSrc:      SpO2: 93% 93%  91%  Weight:      Height:        Intake/Output Summary (Last 24 hours) at 04/17/2017 1143 Last data filed at 04/17/2017 1025 Gross per 24 hour  Intake 830 ml  Output 25 ml  Net 805 ml   Filed Weights   04/15/17 0544 04/16/17 0409 04/17/17 0517  Weight: 166 lb 3.2 oz (75.4 kg) 169 lb 4.8 oz (76.8 kg) 172 lb 12.8 oz  (78.4 kg)    Telemetry    Atrial fibrillation in the low 100s- Personally Reviewed  ECG    Atrial fibrillation  - Personally Reviewed  Physical Exam   GEN: No acute distress.   Neck: No JVD Cardiac: irregularlly irregular, mildly tachy rate, no murmurs, rubs, or gallops.  Respiratory: Clear to auscultation bilaterally. GI: Soft, nontender, non-distended  MS: No edema; No deformity. Neuro:  Nonfocal  Psych: Normal affect   Labs    Chemistry Recent Labs  Lab 04/14/17 1427 04/15/17 0556 04/17/17 0559  NA 135 136 134*  K 4.1 4.0 4.5  CL 92* 99* 97*  CO2 30 26 21*  GLUCOSE 104* 83 86  BUN 34* 18 45*  CREATININE 6.81* 4.56* 8.33*  CALCIUM 8.8* 8.7* 9.0  ALBUMIN  --   --  3.2*  GFRNONAA 5* 9* 4*  GFRAA 6* 10* 5*  ANIONGAP 13 11 16*     Hematology Recent Labs  Lab 04/14/17 2359 04/15/17 0556 04/17/17 0559  WBC 4.4 3.9* 4.4  RBC 3.76* 3.49* 3.52*  HGB 10.4* 9.4* 9.4*  HCT 31.4* 28.8* 29.5*  MCV 83.5 82.5 83.8  MCH 27.7 26.9 26.7  MCHC 33.1 32.6 31.9  RDW 17.8* 17.4* 18.8*  PLT 122* 120* 144*    Cardiac Enzymes Recent Labs  Lab 04/14/17 2359 04/15/17 0556 04/15/17 1031  TROPONINI 0.35* 0.61* 0.88*    Recent Labs  Lab 04/14/17 1433  TROPIPOC 0.26*     BNP Recent Labs  Lab 04/14/17 1533  BNP 600.3*     DDimer No results for input(s): DDIMER in the last 168 hours.   Radiology    Dg Chest Port 1 View  Result Date: 04/17/2017 CLINICAL DATA:  72 year old female with end-stage renal disease on dialysis. Postop. Subsequent encounter. EXAM: PORTABLE CHEST 1 VIEW COMPARISON:  04/16/2017 and 11/20/2016 chest x-ray. FINDINGS: Catheter extends from below diaphragm courses over the heart with the tip projecting at the level of the distal superior vena cava just above the expected level of the cavoatrial junction. Cardiomegaly.  Mitral valve calcifications. Chronic lung changes with superimposed pulmonary vascular congestion. Calcified aorta. No  pneumothorax. IMPRESSION: Catheter extends from below diaphragm courses over the heart with the tip projecting at the level of the distal superior vena cava just above the expected level of the cavoatrial junction. Cardiomegaly. Mitral valve calcifications. Chronic lung changes with superimposed pulmonary vascular congestion. Aortic Atherosclerosis (ICD10-I70.0). Electronically Signed   By: Genia Del M.D.   On: 04/17/2017 08:49   Dg Chest Port 1 View  Result Date: 04/16/2017 CLINICAL DATA:  Pulmonary edema. EXAM: PORTABLE CHEST 1 VIEW COMPARISON:  Radiographs of April 14, 2017. FINDINGS: Stable cardiomegaly. Atherosclerosis of thoracic aorta is noted. No pneumothorax or pleural effusion is noted. Stable diffuse interstitial prominence is noted which may represent chronic scarring, but superimposed acute edema or inflammation cannot be excluded. Bony thorax is unremarkable. IMPRESSION: Stable diffuse interstitial prominence is noted which may represent chronic scarring, but superimposed acute edema or inflammation cannot be excluded. Aortic Atherosclerosis (ICD10-I70.0). Electronically Signed   By: Marijo Conception, M.D.   On: 04/16/2017 07:34   Dg Fluoro Guide Cv Line-no Report  Result Date: 04/17/2017 Fluoroscopy was utilized by the requesting physician.  No radiographic interpretation.    Cardiac Studies   2D Echo 01/2016 Study Conclusions  - Left ventricle: The cavity size was normal. Wall thickness was   increased in a pattern of mild LVH. Systolic function was normal.   The estimated ejection fraction was in the range of 55% to 60%.   Wall motion was normal; there were no regional wall motion   abnormalities. Doppler parameters are consistent with high   ventricular filling pressure. - Aortic valve: There was mild stenosis. - Mitral valve: Severely calcified annulus. The findings are   consistent with mild stenosis. There was mild regurgitation.   Valve area by pressure half-time:  2.02 cm^2. - Left atrium: The atrium was severely dilated. - Right atrium: The atrium was mildly dilated. - Tricuspid valve: There was severe regurgitation. - Pulmonary arteries: Systolic pressure was severely increased. PA   peak pressure: 80 mm Hg (S). - Pericardium, extracardiac: A small pericardial effusion was   identified.  Impressions:  - Normal LV systolic function; mild LVH; calcified aortic valve   with mild AS (mean gradient 12 mmHg); severe MAC with mild MS and   mild MR; biatrial enlargment; moderate to severe TR withi   severely elevated pulmonary pressure; small pericardial effusion.  LHC 11/2016 Conclusion    Severe diffuse calcific coronary artery disease.  Relatively focal distal left main of 40-50% stenosis.  Ostial circumflex 75-85%, forming a [place] 101 bifurcation stenosis with the LAD and  left main.  75% distal circumflex.  Large, widely patent LAD.  Ostial RCA stent that overhangs into the ascending aorta.  Selective engagement was not possible.  Severe proximal to mid disease noted with images obtained.  LV is underfilled on hand injection, LVEDP is 25 mmHg, estimated overall EF is 50%.  Findings are compatible with chronic combined diastolic heart failure.  RECOMMENDATIONS:   In comparing images with prior study from 2013, perhaps continued medical therapy is best.  The right coronary cannot be treated percutaneously due to ostial stent overhang.  Left main/LAD/circumflex disease could be stented but would be complex and require indefinite dual antiplatelet therapy.  Not sure that she is a surgical candidate given comorbidities and continued smoking.  Heart team approach.     Patient Profile     72 y.o. female w/ ESRD on HD pulmonary HTN, Essential HTN, HLD, CAD w/ previous RCA stenting, OSA and atrial fibrillation. She underwent recent vascular surgery for new HD access in left upper am, and presented back with post op pain and swelling. Pt  also with new chest pain in the setting of atrial fibrillation w/ RVR, for which cardiology was consulted. Pt taken back to OR today by vascular surgery for insertion of dialysis catheter in left groin.   Assessment & Plan    1. Atrial Fibrillation w/ RVR: resting rates currently in the low 100s. Pt denies palpitations. No current CP. BP is soft, in the mid 46O systolic. There was ? Regarding dosing of Cardizem, 120 mg of long acting vs 30 mg Q6H dosing. Given her soft BP, she may do better with smaller Q6H dosing. Also Consider changing BB from coreg to metoprolol given soft BP. ? Use of midodrine to keep BP stable to allow further med titration of rate control agents to better control afib. Will discuss with MD.  Coumadin currently on hold given recent surgery. Resume once stable from a surgical standpoint.   2. CAD/Chest Pain: h/o RCA stenting. Last cath 01/2016 showed patent proximal RCA stent with moderate ISR, moderate mid RCA disease and moderate LCx disease, treated medically. Pt reported CP with rapid atrial fibrillation, but currently CP free. Continue medical therapy.   3. ESRD: on HD MWF  For questions or updates, please contact [place] Please consult [email or web address] for contact info under Cardiology/STEMI.      Signed, Lyda Jester, PA-C  04/17/2017, 11:43 AM    .I will change the Cardizem to Q6 hours and discontinue the CD Cardizem.  Will consider milronine based on the BP response.

## 2017-04-17 NOTE — Progress Notes (Signed)
   Was called by Internal Medicine Attending MD regarding recurrent CP during HD. She has known coronary disease, with last LHC in 11/2016 showing the following:   Severe diffuse calcific coronary artery disease.  Relatively focal distal left main of 40-50% stenosis.  Ostial circumflex 75-85%, forming a [place] 101 bifurcation stenosis with the LAD and left main.  75% distal circumflex.  Large, widely patent LAD.  Ostial RCA stent that overhangs into the ascending aorta.  Selective engagement was not possible.  Severe proximal to mid disease noted with images obtained.  LV is underfilled on hand injection, LVEDP is 25 mmHg, estimated overall EF is 50%.  Findings are compatible with chronic combined diastolic heart failure.  Medical therapy was elected. Per cath report, "The right coronary cannot be treated percutaneously due to ostial stent overhang.  Left main/LAD/circumflex disease could be stented but would be complex and require indefinite dual antiplatelet therapy.  Not sure that she is a surgical candidate given comorbidities and continued smoking"  I've spoken with pt. She refuses to have another heart cath and has made decision to change code status to DNR. She has received IV morphine and SL NTG and pain is easing off. HR in the 130s and she is due for another dose of PO Cardizem which RN plans to give. Dr. Verlon Au also adding Ranexa to regimen. Will f/u on pt later tonight.

## 2017-04-17 NOTE — Progress Notes (Signed)
To HD via bed.  Report given to receiving RN.

## 2017-04-17 NOTE — Progress Notes (Addendum)
[facility] Progress Note   Subjective:  Feeling ok post procedure. Denies SOB.  Concerned b/c her BP is not usually this low.  Not tolerating Fosrenol, b/c it is making him nauseated, wants to switch back to Renvela.    Objective Vitals:   04/17/17 0915 04/17/17 0930 04/17/17 1100 04/17/17 1217  BP: 113/76 112/62  94/60  Pulse: (!) 116  (!) 110 (!) 114  Resp: 15 16 (!) 23 18  Temp:    97.9 F (36.6 C)  TempSrc:    Oral  SpO2: 93%  91% 91%  Weight:      Height:       Physical Exam General:NAD, WDWN female, sitting in bed Heart:regular rate, irregular rhythm  Lungs:CTAB Abdomen:soft, NTND Extremities:no LE edema Dialysis Access: L thigh TDC, LU AVG +b/t   Filed Weights   04/15/17 0544 04/16/17 0409 04/17/17 0517  Weight: 75.4 kg (166 lb 3.2 oz) 76.8 kg (169 lb 4.8 oz) 78.4 kg (172 lb 12.8 oz)    Intake/Output Summary (Last 24 hours) at 04/17/2017 1303 Last data filed at 04/17/2017 1025 Gross per 24 hour  Intake 830 ml  Output 25 ml  Net 805 ml    Additional Objective Labs: Basic Metabolic Panel: Recent Labs  Lab 04/15/17 0556 04/15/17 1031 04/17/17 0559 04/17/17 1026  NA 136  --  134* 132*  K 4.0  --  4.5 4.4  CL 99*  --  97* 95*  CO2 26  --  21* 22  GLUCOSE 83  --  86 103*  BUN 18  --  45* 44*  CREATININE 4.56*  --  8.33* 8.29*  CALCIUM 8.7*  --  9.0 8.9  PHOS  --  4.5 6.1* 6.2*   Liver Function Tests: Recent Labs  Lab 04/17/17 0559 04/17/17 1026  ALBUMIN 3.2* 3.1*   CBC: Recent Labs  Lab 04/14/17 1427 04/14/17 2359 04/15/17 0556 04/17/17 0559 04/17/17 1026  WBC 4.5 4.4 3.9* 4.4 4.6  NEUTROABS  --  2.8  --  2.5  --   HGB 7.6* 10.4* 9.4* 9.4* 9.6*  HCT 23.8* 31.4* 28.8* 29.5* 29.3*  MCV 81.8 83.5 82.5 83.8 82.8  PLT 138* 122* 120* 144* 112*   Blood Culture    Component Value Date/Time   SDES BLOOD HEMODIALYSIS CATHETER 04/14/2017 1920   SPECREQUEST  04/14/2017 1920    BOTTLES DRAWN AEROBIC AND ANAEROBIC Blood Culture  results may not be optimal due to an excessive volume of blood received in culture bottles   CULT  04/14/2017 1920    NO GROWTH 2 DAYS Performed at [facility], [place] [address], [place], [place] [address]    REPTSTATUS PENDING 04/14/2017 1920    Cardiac Enzymes: Recent Labs  Lab 04/14/17 2359 04/15/17 0556 04/15/17 1031  TROPONINI 0.35* 0.61* 0.88*   Iron Studies:  Recent Labs    04/15/17 1031  IRON 38  TIBC 276   Studies/Results: Dg Chest Port 1 View  Result Date: 04/17/2017 CLINICAL DATA:  72 year old female with end-stage renal disease on dialysis. Postop. Subsequent encounter. EXAM: PORTABLE CHEST 1 VIEW COMPARISON:  04/16/2017 and 11/20/2016 chest x-ray. FINDINGS: Catheter extends from below diaphragm courses over the heart with the tip projecting at the level of the distal superior vena cava just above the expected level of the cavoatrial junction. Cardiomegaly.  Mitral valve calcifications. Chronic lung changes with superimposed pulmonary vascular congestion. Calcified aorta. No pneumothorax. IMPRESSION: Catheter extends from below diaphragm courses over the heart with  the tip projecting at the level of the distal superior vena cava just above the expected level of the cavoatrial junction. Cardiomegaly. Mitral valve calcifications. Chronic lung changes with superimposed pulmonary vascular congestion. Aortic Atherosclerosis (ICD10-I70.0). Electronically Signed   By: Genia Del M.D.   On: 04/17/2017 08:49   Dg Chest Port 1 View  Result Date: 04/16/2017 CLINICAL DATA:  Pulmonary edema. EXAM: PORTABLE CHEST 1 VIEW COMPARISON:  Radiographs of April 14, 2017. FINDINGS: Stable cardiomegaly. Atherosclerosis of thoracic aorta is noted. No pneumothorax or pleural effusion is noted. Stable diffuse interstitial prominence is noted which may represent chronic scarring, but superimposed acute edema or inflammation cannot be excluded. Bony thorax is unremarkable. IMPRESSION: Stable  diffuse interstitial prominence is noted which may represent chronic scarring, but superimposed acute edema or inflammation cannot be excluded. Aortic Atherosclerosis (ICD10-I70.0). Electronically Signed   By: Marijo Conception, M.D.   On: 04/16/2017 07:34   Dg Fluoro Guide Cv Line-no Report  Result Date: 04/17/2017 Fluoroscopy was utilized by the requesting physician.  No radiographic interpretation.    Medications: . sodium chloride    . sodium chloride    . cefTAZidime (FORTAZ)  IV    . vancomycin     . ambrisentan  5 mg Oral Daily  . aspirin EC  81 mg Oral Daily  . carvedilol  25 mg Oral BID WC  . cilostazol  100 mg Oral BID AC  . diltiazem  120 mg Oral Daily  . diltiazem  30 mg Oral Q6H  . diphenhydrAMINE  50 mg Oral Q M,W,F  . enoxaparin (LOVENOX) injection  75 mg Subcutaneous QHS  . epoetin (EPOGEN/PROCRIT) injection  9,600 Units Intravenous Q M,W,F-HD  . isosorbide dinitrate  10 mg Oral TID  . levETIRAcetam  500 mg Oral Q2000  . levothyroxine  150 mcg Oral QAC breakfast  . multivitamin  1 tablet Oral QHS  . mupirocin ointment   Nasal BID  . pantoprazole  40 mg Oral Daily  . pravastatin  40 mg Oral QHS  . sevelamer carbonate  1,600 mg Oral TID WC  . sevelamer carbonate  800 mg Oral BID BM    Dialysis Orders: MWF [facility] 75kg   2/2 bath  P4  R thigh TDC (now removed) / maturing LUA AVG  Hep none (allergic) -Epogen 9600 Units IV qHD - Weekly dose 35456 -Sensipar 120mg  PO qHD -Calcitriol 2.20mch PO qHD  Home meds: - coreg 25 bid/ dilt 120 qd/ hydral 25 tid - sl ntg prn/ isordil 20 tid - PPI/ sensipar 90/ desyrel/ T4/ norco prn/ pletal  - coumadin/ lovenox SQ - keppra - fosrenol 100mg  AC TID, 1 BID w/snacks   Assessment/Plan: 1. Chest pain/+troponin - per primary 2. Volume excess /pulm edema - CXR changes may be chronic, haven't changed, pt not SOB, exam normal, on RA. Plan for HD today, will remove volume as tolerated. 3. R thigh TDC drainage - suspect cath  infection, sp removal yest 4/14.  BC NGTD. TDC placed this AM by Dr. Bridgett Larsson.  4. ESRD - had HD Saturday.  Plan for HD today with L thigh TDC. K 4.4.  Usual HD is MWF. 5. A fib w/RVR - per primary. No heparin d/t allergy, on po dilt/ coreg per cards 6. CAD - known w/RCA stent, last cath Nov 7. CM - LVEF 30% - per primary 5. Anemia of CKD- Hgb 9.6 s/p 2Unit pRBC, start epogen 9600 Unit qHD today 6. Secondary hyperparathyroidism - Ca in goal. P  6.2. Cont Senispar, calcitriol.  Not tolerated Fosrenol, change to Renvela 2AC TID, 1 w/snacks BID. 7. HTN - BP low today. Keep SBP>90 w/HD. 6. Nutrition - Renal diet with fluid restrictions.  7. Seizure do - on keppra 8. S/p new LUA AVG - placed on 4/4 by Dr. Madalyn Rob, PA-C [facility] Pager: [phone] 04/17/2017,1:03 PM  LOS: 3 days   Pt seen, examined and agree w A/P as above. HD today using new TDC, HD again in am to get back on schedule. OK to dc tomorrow after HD.  L arm AVG will be ready to use at the end of April.   Kelly Splinter MD [employer] pager [phone]   04/17/2017, 1:46 PM

## 2017-04-17 NOTE — Anesthesia Preprocedure Evaluation (Signed)
Anesthesia Evaluation  Patient identified by MRN, date of birth, ID band Patient awake    Reviewed: Allergy & Precautions, NPO status , Patient's Chart, lab work & pertinent test results  Airway Mallampati: II  TM Distance: >3 FB Neck ROM: Full    Dental  (+) Edentulous Upper, Dental Advisory Given   Pulmonary former smoker,    breath sounds clear to auscultation       Cardiovascular hypertension,  Rhythm:Irregular Rate:Tachycardia     Neuro/Psych    GI/Hepatic   Endo/Other    Renal/GU      Musculoskeletal   Abdominal   Peds  Hematology   Anesthesia Other Findings   Reproductive/Obstetrics                             Anesthesia Physical Anesthesia Plan  ASA: III  Anesthesia Plan: General   Post-op Pain Management:    Induction: Intravenous  PONV Risk Score and Plan: Ondansetron  Airway Management Planned: LMA  Additional Equipment:   Intra-op Plan:   Post-operative Plan:   Informed Consent: I have reviewed the patients History and Physical, chart, labs and discussed the procedure including the risks, benefits and alternatives for the proposed anesthesia with the patient or authorized representative who has indicated his/her understanding and acceptance.   Dental advisory given  Plan Discussed with: CRNA and Anesthesiologist  Anesthesia Plan Comments:         Anesthesia Quick Evaluation

## 2017-04-17 NOTE — Progress Notes (Signed)
Hospitalist progress note   [name]  PYP:[id number] DOB: [birth year] DOA: 04/14/2017 PCP: Glendale Chard, MD  Specialists:  Dr. Lake Bells pulmonary Dr. Percival Spanish cardiology DKA [place] [facility]  Brief Narrative:  71 ESRD HD MWF X 2008, OSA, CAD MI RCA sten-known t systolic HF 24%, A. fib, new dialysis access placed 4/9 LUE-- cough, - fever, - abdominal pain, - chills Admit pulmonary edema, chest pain, +-- troponin (atypical), hypotensive, A. fib RVR on metoprolol IV and placed on IV heparin Also new anemia-transfused 2 unit PRBC Blood culture pending, culture drainage NGTD  Assessment & Plan:   Atypical chest pain-troponin trend flat, note peak troponin 11/2016  was 3, repeat EKG this a.m. and continue to trend troponin, reasonable to continue IV heparin at this time, will discuss with cardiology planning for management if trend increases or EKG looks suspicious for ischemia/infarct--continue Pletal 100 twice daily-Coreg 25 twice daily meal  Atrial fibrillation chads score >3 since 11/23/2016-at home on Coumadin 7.5-on rx doses lovenox currently? Control complicated by hypotension--Hypotensive on DIlaysis and will need either EDW adjustment OR adjustment of Cardiac meds Cardiology adjusting meds -see MAR  Note 11/2016 with right coronary "unable to be treated with PCI due to ostial stent overhang- unclear if at that time or in the future would be a surgical candidate given comorbidities and smoker" No role for cath per cards  -we will monitor symptoms---is CP free this am  New anemia baseline 9-10 on admission 7.6-S/P 2U PRBC-volume management w dialysis,-high output heart failure causing strain more likely cause of troponin-see above discussion Bloodcount 7--2u PRBC--10.4-->9.6 Rpt in am Dose aranesp Combined Acute blood loss anemia + anemia renal dx  Pulmonary hypertension-currently on Latairis.  Continue management  Infected TDC-discussed with Dr. Mar Daring thigh graft TDC  removed line holiday  New access placed-L Fem tunneled dialysis access Cult from 4/13 NGTD Rpt cultures done 4/15--NGTD  OSA-BiPAP if will tolerate  ESRD MWF-appreciate nephrology-obtain phosphorus, magnesium and check iron studies-may need Aranesp + ferrous sulfate? Binder as per nephrology --see above discussion   TCP NOS-seems chronic worsened by ABLA--OP follow up    Patient is on Lovenox {UNLIKELY HEPARIN ALLERGY] full code, no family   Procedures:  Removal left [facility] [temporary dialysis catheter] right thigh  Antimicrobials:   Vancomycin 4/14  Ceftaz a dime 4/13  Subjective:  hypotensive on HD Doing fair and tolerating it No cp Worried about overall her condition-New acces seems to be working fine  Objective: Vitals:   04/17/17 1400 04/17/17 1411 04/17/17 1415 04/17/17 1430  BP: (!) 79/49 (!) 87/46 (!) 84/39 (!) 77/54  Pulse: 71 81 68 80  Resp:  19 (!) 21   Temp: 97.6 F (36.4 C)     TempSrc: Oral     SpO2: 92%     Weight: 80.4 kg (177 lb 4 oz)     Height:        Intake/Output Summary (Last 24 hours) at 04/17/2017 1442 Last data filed at 04/17/2017 1300 Gross per 24 hour  Intake 1070 ml  Output 25 ml  Net 1045 ml   Filed Weights   04/16/17 0409 04/17/17 0517 04/17/17 1400  Weight: 76.8 kg (169 lb 4.8 oz) 78.4 kg (172 lb 12.8 oz) 80.4 kg (177 lb 4 oz)    Examination:   looking about stated age arcus senilis no JVD no bruit S1-S2 no murmur has PVCs and a paced rhythm without any tachyarrhythmia on monitors Abdomen is soft nontender no rebound Left upper arm  has 2 areas of fistula right has an old 1--arm is swollen LLE has dressing covering Neurologically intact Anxious  No lower extremity edema   Data Reviewed: I have personally reviewed following labs and imaging studies  CBC: Recent Labs  Lab 04/14/17 1427 04/14/17 2359 04/15/17 0556 04/17/17 0559 04/17/17 1026  WBC 4.5 4.4 3.9* 4.4 4.6  NEUTROABS  --  2.8  --  2.5  --   HGB 7.6*  10.4* 9.4* 9.4* 9.6*  HCT 23.8* 31.4* 28.8* 29.5* 29.3*  MCV 81.8 83.5 82.5 83.8 82.8  PLT 138* 122* 120* 144* 009*   Basic Metabolic Panel: Recent Labs  Lab 04/14/17 1427 04/15/17 0556 04/15/17 1031 04/17/17 0559 04/17/17 1026  NA 135 136  --  134* 132*  K 4.1 4.0  --  4.5 4.4  CL 92* 99*  --  97* 95*  CO2 30 26  --  21* 22  GLUCOSE 104* 83  --  86 103*  BUN 34* 18  --  45* 44*  CREATININE 6.81* 4.56*  --  8.33* 8.29*  CALCIUM 8.8* 8.7*  --  9.0 8.9  PHOS  --   --  4.5 6.1* 6.2*   GFR: Estimated Creatinine Clearance: 6.1 mL/min (A) (by C-G formula based on SCr of 8.29 mg/dL (H)). Liver Function Tests: Recent Labs  Lab 04/17/17 0559 04/17/17 1026  ALBUMIN 3.2* 3.1*   No results for input(s): LIPASE, AMYLASE in the last 168 hours. No results for input(s): AMMONIA in the last 168 hours. Coagulation Profile: Recent Labs  Lab 04/14/17 1427 04/15/17 0556  INR 1.52 1.61   Cardiac Enzymes:  Radiology Studies: Reviewed images personally in health database   Scheduled Meds: . ambrisentan  5 mg Oral Daily  . aspirin EC  81 mg Oral Daily  . carvedilol  25 mg Oral BID WC  . cilostazol  100 mg Oral BID AC  . diltiazem  60 mg Oral Q6H  . diphenhydrAMINE  50 mg Oral Q M,W,F  . enoxaparin (LOVENOX) injection  75 mg Subcutaneous QHS  . epoetin (EPOGEN/PROCRIT) injection  9,600 Units Intravenous Q M,W,F-HD  . isosorbide dinitrate  10 mg Oral TID  . levETIRAcetam  500 mg Oral Q2000  . levothyroxine  150 mcg Oral QAC breakfast  . multivitamin  1 tablet Oral QHS  . mupirocin ointment   Nasal BID  . pantoprazole  40 mg Oral Daily  . pravastatin  40 mg Oral QHS  . sevelamer carbonate  1,600 mg Oral TID WC  . sevelamer carbonate  800 mg Oral BID BM   Continuous Infusions: . sodium chloride    . sodium chloride    . sodium chloride    . sodium chloride    . cefTAZidime (FORTAZ)  IV    . cefTAZidime (FORTAZ)  IV    . vancomycin       LOS: 3 days    Time spent:  [place], MD [facility] ([facility]  If 7PM-7AM, please contact night-coverage [email or web address] Password [email or web address] 04/17/2017, 2:42 PM

## 2017-04-17 NOTE — Anesthesia Procedure Notes (Signed)
Procedure Name: LMA Insertion Date/Time: 04/17/2017 7:42 AM Performed by: Mariea Clonts, CRNA Pre-anesthesia Checklist: Patient identified, Emergency Drugs available, Suction available and Patient being monitored Patient Re-evaluated:Patient Re-evaluated prior to induction Oxygen Delivery Method: Circle System Utilized Preoxygenation: Pre-oxygenation with 100% oxygen Induction Type: IV induction Ventilation: Mask ventilation without difficulty LMA: LMA inserted LMA Size: 4.0 Number of attempts: 1 Airway Equipment and Method: Bite block Placement Confirmation: positive ETCO2 Tube secured with: Tape Dental Injury: Teeth and Oropharynx as per pre-operative assessment

## 2017-04-17 NOTE — Anesthesia Postprocedure Evaluation (Signed)
Anesthesia Post Note  Patient: [name]  Procedure(s) Performed: INSERTION OF DIALYSIS CATHETER LEFT GROIN (Left )     Patient location during evaluation: PACU Anesthesia Type: General Level of consciousness: awake and alert Pain management: pain level controlled Vital Signs Assessment: post-procedure vital signs reviewed and stable Respiratory status: spontaneous breathing, nonlabored ventilation, respiratory function stable and patient connected to nasal cannula oxygen Cardiovascular status: blood pressure returned to baseline and stable Postop Assessment: no apparent nausea or vomiting Anesthetic complications: no    Last Vitals:  Vitals:   04/17/17 1100 04/17/17 1217  BP:  94/60  Pulse: (!) 110 (!) 114  Resp: (!) 23 18  Temp:  36.6 C  SpO2: 91% 91%    Last Pain:  Vitals:   04/17/17 1217  TempSrc: Oral  PainSc:                  Robet Crutchfield COKER

## 2017-04-17 NOTE — Progress Notes (Signed)
Upon return from PACU, noted that patient was ordered Cardizem CD 120mg  on 4/13 and Cardizem 30mg  every six hours on 4/14. Both orders were active and the patient received both doses on 4/14 and 4/15.  Paged Dr. Benson Norway to clarify which medication/dose the patient is to receive.  Since Cardiology is following the patient, he asked for them to clarify.  Paged Cardiology.

## 2017-04-17 NOTE — Progress Notes (Signed)
ANTICOAGULATION CONSULT NOTE - Initial Consult  Pharmacy Consult for Lovenox Indication: ACS  Allergies  Allergen Reactions  . Ace Inhibitors Anaphylaxis and Rash  . Heparin Other (See Comments)    MDs told her not to take after reaction in ICU  . Iohexol Swelling and Other (See Comments)    1970s; passed out and had facial/tongue swelling.  Requires 13-hour prep with prednisone and benadryl  . Phenytoin Other (See Comments)    Had reaction while in ICU; doesn't know.  MDs told her not to take ever again.  . Wellbutrin [Bupropion] Other (See Comments)    seizures  . Meperidine Hcl Swelling and Rash    Makes tongue swell  . Morphine Rash  . Penicillins Hives and Rash    Has patient had a PCN reaction causing immediate rash, facial/tongue/throat swelling, SOB or lightheadedness with hypotension: Yes Has patient had a PCN reaction causing severe rash involving mucus membranes or skin necrosis: No Has patient had a PCN reaction that required hospitalization: No Has patient had a PCN reaction occurring within the last 10 years: No If all of the above answers are "NO", then may proceed with Cephalosporin use.    . Valproic Acid And Related Other (See Comments)    Confusion   . Iodinated Diagnostic Agents Other (See Comments)    Pre-meditate with benadryl and prednisone 3 times before appt.  . Pentazocine Lactate Other (See Comments)    Patient does not remember reaction to this med ([place]).     Patient Measurements: Height: 5' 2.5" (158.8 cm) Weight: 177 lb 4 oz (80.4 kg) IBW/kg (Calculated) : 51.25 Heparin Dosing Weight: 80 kg  Vital Signs: Temp: 97.9 F (36.6 C) (04/16 1543) Temp Source: Oral (04/16 1543) BP: 104/59 (04/16 1543) Pulse Rate: 120 (04/16 1543)  Labs: Recent Labs    04/14/17 2359 04/15/17 0556 04/15/17 1031 04/17/17 0559 04/17/17 1026  HGB 10.4* 9.4*  --  9.4* 9.6*  HCT 31.4* 28.8*  --  29.5* 29.3*  PLT 122* 120*  --  144* 112*  LABPROT  --   19.0*  --   --   --   INR  --  1.61  --   --   --   CREATININE  --  4.56*  --  8.33* 8.29*  TROPONINI 0.35* 0.61* 0.88*  --   --     Estimated Creatinine Clearance: 6.1 mL/min (A) (by C-G formula based on SCr of 8.29 mg/dL (H)).   Medical History: Past Medical History:  Diagnosis Date  . Anemia    History  of Blood transfusion  . Asthma    PMH  . Atrial fibrillation with RVR ([place]) 11/2016  . CAD (coronary artery disease)    stent to RCA  . Cancer ([place])    clear cell cancer, kidney  . Complication of anesthesia 12/2010   pt is very confused, with AMS with anesthesia  . CVA (cerebral infarction) 2003   no apparent residual  . Dyslipidemia   . Encephalopathy   . ESRD 07/27/2008   ESRD due to HTN and NSAID's, started hemodialysis in 2005 in [place], [place]. Went to [facility] from 2010 to 2012 and since 2012 has been getting dialysis at [facility] on [address] in [place] on a MWF schedule. First access with RUA AVG placed in [place]. Next and current access was LUA AVG placed by Dr. Lucky Cowboy in [place] in or around 2012. Has had 2 or 3 procedures on that graft since placed  per family. She gets her access work done here in [place] now. She is allergic to heparin and does not get any heparin at dialysis; she had an allergic reaction apparently when in ICU in the past.      . GERD (gastroesophageal reflux disease)   . Hyperlipidemia   . Hypertension   . Hypothyroidism   . Myocardial infarction ([place])    per pt  . Positive PPD    completed rifampin  . Pulmonary hypertension ([place])   . PVD (peripheral vascular disease) ([place])   . Seizures ([place])    last seizure 6 years ago  . Sleep apnea    Sleep Study 2008  . Traumatic seroma of left lower leg ([place])   . Wears partial dentures     Assessment: Pt presents with NSTEMI and new TDC placed PMH: A fib, HTN, HLD, CVA, PVD, cancer, ESRD, MI Pharmacy consulted to restart Lovenox Last dose given at 1630 (4/16)  Goal of  Therapy:  4-hour Heparin Level Goal of 0.6-1 unit/mL   Plan:  Start Lovenox at 80mg  qD @ (4/17) 1700 Monitor bleeding, signs and sx of clot, platelet counts on CBC  Follow up on restarting Coumadin   [name]  Student PharmD 04/17/2017,4:31 PM

## 2017-04-17 NOTE — Interval H&P Note (Signed)
History and Physical Interval Note:  04/17/2017 7:12 AM  [name]  has presented today for surgery, with the diagnosis of END STAGE RENAL DISEASE FOR HEMODIAYSIS ACCESS  The various methods of treatment have been discussed with the patient and family. After consideration of risks, benefits and other options for treatment, the patient has consented to  Procedure(s): INSERTION OF DIALYSIS CATHETER (N/A) as a surgical intervention .  The patient's history has been reviewed, patient examined, no change in status, stable for surgery.  I have reviewed the patient's chart and labs.  Questions were answered to the patient's satisfaction.     Adele Barthel

## 2017-04-17 NOTE — Progress Notes (Signed)
ANTIBIOTIC NOTE Pharmacy Consult for vancomycin and fortaz Indication:  HD cathter infection  Allergies  Allergen Reactions  . Ace Inhibitors Anaphylaxis and Rash  . Heparin Other (See Comments)    MDs told her not to take after reaction in ICU  . Iohexol Swelling and Other (See Comments)    1970s; passed out and had facial/tongue swelling.  Requires 13-hour prep with prednisone and benadryl  . Phenytoin Other (See Comments)    Had reaction while in ICU; doesn't know.  MDs told her not to take ever again.  . Wellbutrin [Bupropion] Other (See Comments)    seizures  . Meperidine Hcl Swelling and Rash    Makes tongue swell  . Morphine Rash  . Penicillins Hives and Rash    Has patient had a PCN reaction causing immediate rash, facial/tongue/throat swelling, SOB or lightheadedness with hypotension: Yes Has patient had a PCN reaction causing severe rash involving mucus membranes or skin necrosis: No Has patient had a PCN reaction that required hospitalization: No Has patient had a PCN reaction occurring within the last 10 years: No If all of the above answers are "NO", then may proceed with Cephalosporin use.    . Valproic Acid And Related Other (See Comments)    Confusion   . Iodinated Diagnostic Agents Other (See Comments)    Pre-meditate with benadryl and prednisone 3 times before appt.  . Pentazocine Lactate Other (See Comments)    Patient does not remember reaction to this med ([place]).     Patient Measurements: Height: 5' 2.5" (158.8 cm) Weight: 177 lb 4 oz (80.4 kg) IBW/kg (Calculated) : 51.25 Heparin Dosing Weight: 67kg  Vital Signs: Temp: 97.9 F (36.6 C) (04/16 1543) Temp Source: Oral (04/16 1543) BP: 104/59 (04/16 1543) Pulse Rate: 120 (04/16 1543)  Labs: Recent Labs    04/14/17 2359 04/15/17 0556 04/15/17 1031 04/17/17 0559 04/17/17 1026  HGB 10.4* 9.4*  --  9.4* 9.6*  HCT 31.4* 28.8*  --  29.5* 29.3*  PLT 122* 120*  --  144* 112*  LABPROT  --  19.0*   --   --   --   INR  --  1.61  --   --   --   CREATININE  --  4.56*  --  8.33* 8.29*  TROPONINI 0.35* 0.61* 0.88*  --   --     Estimated Creatinine Clearance: 6.1 mL/min (A) (by C-G formula based on SCr of 8.29 mg/dL (H)).   Medical History: Past Medical History:  Diagnosis Date  . Anemia    History  of Blood transfusion  . Asthma    PMH  . Atrial fibrillation with RVR ([place]) 11/2016  . CAD (coronary artery disease)    stent to RCA  . Cancer ([place])    clear cell cancer, kidney  . Complication of anesthesia 12/2010   pt is very confused, with AMS with anesthesia  . CVA (cerebral infarction) 2003   no apparent residual  . Dyslipidemia   . Encephalopathy   . ESRD 07/27/2008   ESRD due to HTN and NSAID's, started hemodialysis in 2005 in [place], [place]. Went to [facility] from 2010 to 2012 and since 2012 has been getting dialysis at [facility] on [address] in [place] on a MWF schedule. First access with RUA AVG placed in [place]. Next and current access was LUA AVG placed by Dr. Lucky Cowboy in [place] in or around 2012. Has had 2 or 3 procedures on that graft since placed per  family. She gets her access work done here in [place] now. She is allergic to heparin and does not get any heparin at dialysis; she had an allergic reaction apparently when in ICU in the past.      . GERD (gastroesophageal reflux disease)   . Hyperlipidemia   . Hypertension   . Hypothyroidism   . Myocardial infarction ([place])    per pt  . Positive PPD    completed rifampin  . Pulmonary hypertension ([place])   . PVD (peripheral vascular disease) ([place])   . Seizures ([place])    last seizure 6 years ago  . Sleep apnea    Sleep Study 2008  . Traumatic seroma of left lower leg ([place])   . Wears partial dentures     Assessment: 74 YOF with hx of ESRD on HD, admitted 04/14/17 with chest pain and possible HD cathter infection. S/p HD cath insertion 04/10/17. Pharmacy consult/dosing continues for Vancomycin and  fortaz. ESRD= HD qMWF HD. Also penicillin allergy, but tolerated cefepime before.  Antimicrobials  For HD tunnel cath infection.  WBC 4.6. Afebrile.  Vanc 4/13>> Ceftazidime 4/13>> 4/9: S/p right femoral TDC and LUE AVG on 04/10/17.  4/14:  s/p removal of infected right femoral TDC at bedside  -s/p new TDC placed today 4/16 -completed HD today 4/16  Microbiology:  4/13 Wound R leg: reincubated/pending 4/13 BCx x2: ngtd 4/14 MRSA PCR: negative 4/15 BCx x2: sent 4/16 MRSA surgical PCR: negative   Goal of Therapy:  Pre-HD vancomycin level 15-25   Plan:  Give Fortaz 2g IV after HD today.  No Vancomycin needed post HD today due to recevied Vancomycin 1g preop 4/16 at  [place] 4/17, plan to continue Vancomycin 750 IV post each HD and Fortaz 2g Q HD- qMWF.  - f/u HD schedule and adjust maintenance dose time as needed  Thank you for allowing pharmacy to be part of this patients care team. Nicole Cella, RPh Clinical Pharmacist 8A-4P [phone] [id number]-[id number] [phone] Main Pharmacy [phone] 04/17/2017,4:45 PM

## 2017-04-18 ENCOUNTER — Encounter (HOSPITAL_COMMUNITY): Payer: Self-pay | Admitting: Vascular Surgery

## 2017-04-18 DIAGNOSIS — I214 Non-ST elevation (NSTEMI) myocardial infarction: Secondary | ICD-10-CM

## 2017-04-18 DIAGNOSIS — R748 Abnormal levels of other serum enzymes: Secondary | ICD-10-CM

## 2017-04-18 LAB — CBC
HCT: 27.3 % — ABNORMAL LOW (ref 36.0–46.0)
Hemoglobin: 8.8 g/dL — ABNORMAL LOW (ref 12.0–15.0)
MCH: 27.1 pg (ref 26.0–34.0)
MCHC: 32.2 g/dL (ref 30.0–36.0)
MCV: 84 fL (ref 78.0–100.0)
Platelets: 133 10*3/uL — ABNORMAL LOW (ref 150–400)
RBC: 3.25 MIL/uL — AB (ref 3.87–5.11)
RDW: 18.8 % — ABNORMAL HIGH (ref 11.5–15.5)
WBC: 4.9 10*3/uL (ref 4.0–10.5)

## 2017-04-18 LAB — RENAL FUNCTION PANEL
ALBUMIN: 3 g/dL — AB (ref 3.5–5.0)
ANION GAP: 13 (ref 5–15)
BUN: 39 mg/dL — ABNORMAL HIGH (ref 6–20)
CALCIUM: 9.1 mg/dL (ref 8.9–10.3)
CO2: 23 mmol/L (ref 22–32)
Chloride: 97 mmol/L — ABNORMAL LOW (ref 101–111)
Creatinine, Ser: 7.3 mg/dL — ABNORMAL HIGH (ref 0.44–1.00)
GFR, EST AFRICAN AMERICAN: 6 mL/min — AB (ref >60)
GFR, EST NON AFRICAN AMERICAN: 5 mL/min — AB (ref >60)
Glucose, Bld: 149 mg/dL — ABNORMAL HIGH (ref 65–99)
PHOSPHORUS: 4.9 mg/dL — AB (ref 2.5–4.6)
Potassium: 4.5 mmol/L (ref 3.5–5.1)
SODIUM: 133 mmol/L — AB (ref 135–145)

## 2017-04-18 LAB — PROTIME-INR
INR: 1.79
Prothrombin Time: 20.6 seconds — ABNORMAL HIGH (ref 11.4–15.2)

## 2017-04-18 MED ORDER — METOPROLOL TARTRATE 25 MG PO TABS
25.0000 mg | ORAL_TABLET | Freq: Four times a day (QID) | ORAL | Status: DC
  Administered 2017-04-18 – 2017-04-19 (×2): 25 mg via ORAL
  Filled 2017-04-18 (×3): qty 1

## 2017-04-18 MED ORDER — WARFARIN - PHARMACIST DOSING INPATIENT: Freq: Every day | Status: DC

## 2017-04-18 MED ORDER — DIPHENHYDRAMINE HCL 50 MG/ML IJ SOLN
INTRAMUSCULAR | Status: AC
  Administered 2017-04-18: 50 mg
  Filled 2017-04-18: qty 1

## 2017-04-18 MED ORDER — SENNOSIDES-DOCUSATE SODIUM 8.6-50 MG PO TABS
2.0000 | ORAL_TABLET | Freq: Every evening | ORAL | Status: DC | PRN
  Administered 2017-04-18: 2 via ORAL
  Filled 2017-04-18: qty 2

## 2017-04-18 MED ORDER — CALCITRIOL 0.5 MCG PO CAPS
ORAL_CAPSULE | ORAL | Status: AC
  Filled 2017-04-18: qty 4

## 2017-04-18 MED ORDER — VANCOMYCIN HCL IN DEXTROSE 750-5 MG/150ML-% IV SOLN
INTRAVENOUS | Status: AC
  Filled 2017-04-18: qty 150

## 2017-04-18 MED ORDER — WARFARIN SODIUM 7.5 MG PO TABS
7.5000 mg | ORAL_TABLET | Freq: Once | ORAL | Status: AC
  Administered 2017-04-18: 7.5 mg via ORAL
  Filled 2017-04-18: qty 1

## 2017-04-18 MED ORDER — CALCITRIOL 0.25 MCG PO CAPS
ORAL_CAPSULE | ORAL | Status: AC
  Filled 2017-04-18: qty 1

## 2017-04-18 NOTE — Progress Notes (Signed)
PROGRESS NOTE    [name]   LYY:[id number]  DOB: November 01, 1945  DOA: 04/14/2017 PCP: Glendale Chard, MD   Brief Narrative:  [name] is an 72 y.o. female with past medical history significant for end-stage renal disease on hemodialysis, coronary artery disease, systolic congestive heart failure, pulmonary hypertension, atrial fibrillation on warfarin who presented for chest pain without shortness of breath, nausea vomiting or diaphoresis Noted to have A-fib with RVR, elevated troponin but not acute EKG changes. She was anemic with Hb of 7.6 and admisson and was transfused 2 U PRBC the same day. Also was having fevers and on admission noted to have an infected [facility] which had just been placed on 4/9. Vascular surgery consulted on 4/14 and [facility] was removed the same day. A line holiday was given and a left femoral tunneled cath placed on 4/16.  Subjective: No complaints. Wants to go home.  ROS: no complaints of nausea, vomiting, constipation diarrhea, cough, dyspnea or dysuria. No other complaints.   Assessment & Plan:   Principal Problem: Chest pain and slightly elevated troponin in EDRD with known CAD    Ref. Range 04/14/2017 23:59 04/15/2017 05:56 04/15/2017 10:31 04/17/2017 16:20  Troponin I Latest Ref Range: <0.03 ng/mL 0.35 (HH) 0.61 (HH) 0.88 (HH) 0.33 (HH)   - has RCA stent  - cath in 11/18 > Severe diffuse calcific coronary artery disease with patent stent - medical management recommended again at that time  - patient declines a cath and has changed code status to DNR  - ASA, Cardizem, Isosorbide dinitrate  Active Problems:   Atrial fibrillation with RVR / Labile BP - BP making control of A-fib challenging- if HR remains stable tomorrow, will consider discharge - Cardizem every 6 hrs per cardiology - Coreg 25 mg BID - Coumadin on hold- on full dose Lovenox for line placement- resume Coumadin today   Infected TDC in right thigh - now has left femoral tunneled cath in  left thigh(placed 4/16) after line holiday - left arm AVg will b ready to use in end of April per renal -cultures from 4/13 and 4/15 negative - nephrology suggests 7-10 days of antibiotics- started on 4/13- stop date would be on 1/27  Chronic systolic CHF- severe pulm HTN with pressure of 80- mod to severe TR -EF 30% but improved to 50-55 % in 11/18 - [place] for pulm HTN    ESRD- 2nd hyperpara - typical dialysis days are MWF - [facility]  Seizure disorder - Keppra  Anemia in chronic renal disease - transfusion given as mentioned above - Epogen and Iron per renal team   DVT prophylaxis: Lovenox Code Status: DNR Family Communication:  Disposition Plan: home  Consultants:   Cardiology  nephrology  vasc surgery    Antimicrobials:  Anti-infectives (From admission, onward)   Start     Dose/Rate Route Frequency Ordered Stop   04/17/17 1800  cefTAZidime (FORTAZ) 2 g in sodium chloride 0.9 % 100 mL IVPB  Status:  Discontinued     2 g 200 mL/hr over 30 Minutes Intravenous  Once 04/17/17 1344 04/17/17 1720   04/16/17 1200  vancomycin (VANCOCIN) IVPB 750 mg/150 ml premix     750 mg 150 mL/hr over 60 Minutes Intravenous Every M-W-F (Hemodialysis) 04/14/17 1915     04/16/17 1200  cefTAZidime (FORTAZ) 2 g in sodium chloride 0.9 % 100 mL IVPB     2 g 200 mL/hr over 30 Minutes Intravenous Every M-W-F (Hemodialysis) 04/14/17 1915  04/14/17 2200  vancomycin (VANCOCIN) 1,500 mg in sodium chloride 0.9 % 500 mL IVPB     1,500 mg 250 mL/hr over 120 Minutes Intravenous  Once 04/14/17 1915 04/14/17 2328   04/14/17 2200  cefTAZidime (FORTAZ) 2 g in sodium chloride 0.9 % 100 mL IVPB     2 g 200 mL/hr over 30 Minutes Intravenous  Once 04/14/17 1915 04/15/17 0038       Objective: Vitals:   04/18/17 0636 04/18/17 0700 04/18/17 0704 04/18/17 0730  BP:  107/67 103/65 (!) (P) 110/57  Pulse:  98 100 (P) 94  Resp:  18 18   Temp:  98 F (36.7 C)    TempSrc:  Oral    SpO2:      Weight:  80.8 kg (178 lb 1.6 oz)     Height:        Intake/Output Summary (Last 24 hours) at 04/18/2017 0801 Last data filed at 04/17/2017 1930 Gross per 24 hour  Intake 1310 ml  Output -401 ml  Net 1711 ml   Filed Weights   04/17/17 0517 04/17/17 1400 04/18/17 0636  Weight: 78.4 kg (172 lb 12.8 oz) 80.4 kg (177 lb 4 oz) 80.8 kg (178 lb 1.6 oz)    Examination: General exam: Appears comfortable  HEENT: PERRLA, oral mucosa moist, no sclera icterus or thrush Respiratory system: Clear to auscultation. Respiratory effort normal. Cardiovascular system: S1 & S2 heard, IIRR.   Gastrointestinal system: Abdomen soft, non-tender, nondistended. Normal bowel sound. No organomegaly Central nervous system: Alert and oriented. No focal neurological deficits. Extremities: No cyanosis, clubbing or edema Skin: No rashes or ulcers Psychiatry:  Mood & affect appropriate.     Data Reviewed: I have personally reviewed following labs and imaging studies  CBC: Recent Labs  Lab 04/14/17 1427 04/14/17 2359 04/15/17 0556 04/17/17 0559 04/17/17 1026  WBC 4.5 4.4 3.9* 4.4 4.6  NEUTROABS  --  2.8  --  2.5  --   HGB 7.6* 10.4* 9.4* 9.4* 9.6*  HCT 23.8* 31.4* 28.8* 29.5* 29.3*  MCV 81.8 83.5 82.5 83.8 82.8  PLT 138* 122* 120* 144* 517*   Basic Metabolic Panel: Recent Labs  Lab 04/14/17 1427 04/15/17 0556 04/15/17 1031 04/17/17 0559 04/17/17 1026  NA 135 136  --  134* 132*  K 4.1 4.0  --  4.5 4.4  CL 92* 99*  --  97* 95*  CO2 30 26  --  21* 22  GLUCOSE 104* 83  --  86 103*  BUN 34* 18  --  45* 44*  CREATININE 6.81* 4.56*  --  8.33* 8.29*  CALCIUM 8.8* 8.7*  --  9.0 8.9  PHOS  --   --  4.5 6.1* 6.2*   GFR: Estimated Creatinine Clearance: 6.1 mL/min (A) (by C-G formula based on SCr of 8.29 mg/dL (H)). Liver Function Tests: Recent Labs  Lab 04/17/17 0559 04/17/17 1026  ALBUMIN 3.2* 3.1*   No results for input(s): LIPASE, AMYLASE in the last 168 hours. No results for input(s): AMMONIA in the  last 168 hours. Coagulation Profile: Recent Labs  Lab 04/14/17 1427 04/15/17 0556  INR 1.52 1.61   Cardiac Enzymes: Recent Labs  Lab 04/14/17 2359 04/15/17 0556 04/15/17 1031 04/17/17 1620  TROPONINI 0.35* 0.61* 0.88* 0.33*   BNP (last 3 results) No results for input(s): PROBNP in the last 8760 hours. HbA1C: No results for input(s): HGBA1C in the last 72 hours. CBG: No results for input(s): GLUCAP in the last 168 hours. Lipid Profile:  No results for input(s): CHOL, HDL, LDLCALC, TRIG, CHOLHDL, LDLDIRECT in the last 72 hours. Thyroid Function Tests: No results for input(s): TSH, T4TOTAL, FREET4, T3FREE, THYROIDAB in the last 72 hours. Anemia Panel: Recent Labs    04/15/17 1031  TIBC 276  IRON 38   Urine analysis:    Component Value Date/Time   COLORURINE YELLOW 09/03/2014 0018   APPEARANCEUR CLOUDY (A) 09/03/2014 0018   LABSPEC 1.010 09/03/2014 0018   PHURINE 5.5 09/03/2014 0018   GLUCOSEU 250 (A) 09/03/2014 0018   HGBUR TRACE (A) 09/03/2014 0018   BILIRUBINUR NEGATIVE 09/03/2014 0018   KETONESUR NEGATIVE 09/03/2014 0018   PROTEINUR 100 (A) 09/03/2014 0018   UROBILINOGEN 0.2 09/03/2014 0018   NITRITE NEGATIVE 09/03/2014 0018   LEUKOCYTESUR NEGATIVE 09/03/2014 0018   Sepsis Labs: @LABRCNTIP (procalcitonin:4,lacticidven:4) ) Recent Results (from the past 240 hour(s))  Aerobic Culture (superficial specimen)     Status: None (Preliminary result)   Collection Time: 04/14/17  6:45 PM  Result Value Ref Range Status   Specimen Description WOUND RIGHT LEG  Final   Special Requests Immunocompromised  Final   Gram Stain   Final    RARE WBC PRESENT, PREDOMINANTLY PMN NO ORGANISMS SEEN    Culture   Final    RARE MOLD ISOLATE REFERRED FOR ID ONLY Performed at [facility], [place] [address] [place], [place] [address]    Report Status PENDING  Incomplete  Culture, blood (routine x 2)     Status: None (Preliminary result)   Collection Time: 04/14/17  7:15 PM    Result Value Ref Range Status   Specimen Description BLOOD HEMODIALYSIS CATHETER  Final   Special Requests   Final    BOTTLES DRAWN AEROBIC AND ANAEROBIC Blood Culture results may not be optimal due to an excessive volume of blood received in culture bottles   Culture   Final    NO GROWTH 3 DAYS Performed at [facility], [place] [address], [place], [place] [address]    Report Status PENDING  Incomplete  Culture, blood (routine x 2)     Status: None (Preliminary result)   Collection Time: 04/14/17  7:20 PM  Result Value Ref Range Status   Specimen Description BLOOD HEMODIALYSIS CATHETER  Final   Special Requests   Final    BOTTLES DRAWN AEROBIC AND ANAEROBIC Blood Culture results may not be optimal due to an excessive volume of blood received in culture bottles   Culture   Final    NO GROWTH 3 DAYS Performed at [facility], [place] [address] [place], [place] [address]    Report Status PENDING  Incomplete  MRSA PCR Screening     Status: None   Collection Time: 04/15/17  1:06 AM  Result Value Ref Range Status   MRSA by PCR NEGATIVE NEGATIVE Final    Comment:        The GeneXpert MRSA Assay (FDA approved for NASAL specimens only), is one component of a comprehensive MRSA colonization surveillance program. It is not intended to diagnose MRSA infection nor to guide or monitor treatment for MRSA infections. Performed at [facility], [place] [address], [place], [place] [address]   Culture, blood (Routine X 2) w Reflex to ID Panel     Status: None (Preliminary result)   Collection Time: 04/16/17 10:14 AM  Result Value Ref Range Status   Specimen Description BLOOD RIGHT ARM  Final   Special Requests   Final  BOTTLES DRAWN AEROBIC AND ANAEROBIC Blood Culture adequate volume   Culture   Final    NO GROWTH 1 DAY Performed at [facility], [place] [address], [place], [place] [address]    Report Status PENDING  Incomplete  Culture, blood (Routine X 2) w Reflex to  ID Panel     Status: None (Preliminary result)   Collection Time: 04/16/17 10:21 AM  Result Value Ref Range Status   Specimen Description BLOOD RIGHT HAND  Final   Special Requests   Final    BOTTLES DRAWN AEROBIC AND ANAEROBIC Blood Culture adequate volume   Culture   Final    NO GROWTH 1 DAY Performed at [facility], [place] [address], [place], [place] [address]    Report Status PENDING  Incomplete  Surgical pcr screen     Status: None   Collection Time: 04/17/17  2:22 AM  Result Value Ref Range Status   MRSA, PCR NEGATIVE NEGATIVE Final   Staphylococcus aureus NEGATIVE NEGATIVE Final    Comment: (NOTE) The Xpert SA Assay (FDA approved for NASAL specimens in patients 36 years of age and older), is one component of a comprehensive surveillance program. It is not intended to diagnose infection nor to guide or monitor treatment. Performed at [facility], [place] [address], [place], [place] [address]          Radiology Studies: Dg Chest [place] 1 View  Result Date: 04/17/2017 CLINICAL DATA:  72 year old female with end-stage renal disease on dialysis. Postop. Subsequent encounter. EXAM: PORTABLE CHEST 1 VIEW COMPARISON:  04/16/2017 and 11/20/2016 chest x-ray. FINDINGS: Catheter extends from below diaphragm courses over the heart with the tip projecting at the level of the distal superior vena cava just above the expected level of the cavoatrial junction. Cardiomegaly.  Mitral valve calcifications. Chronic lung changes with superimposed pulmonary vascular congestion. Calcified aorta. No pneumothorax. IMPRESSION: Catheter extends from below diaphragm courses over the heart with the tip projecting at the level of the distal superior vena cava just above the expected level of the cavoatrial junction. Cardiomegaly. Mitral valve calcifications. Chronic lung changes with superimposed pulmonary vascular congestion. Aortic Atherosclerosis (ICD10-I70.0). Electronically Signed   By: Genia Del M.D.   On: 04/17/2017 08:49   Dg Fluoro Guide Cv Line-no Report  Result Date: 04/17/2017 Fluoroscopy was utilized by the requesting physician.  No radiographic interpretation.      Scheduled Meds: . ambrisentan  5 mg Oral Daily  . aspirin EC  81 mg Oral Daily  . carvedilol  25 mg Oral BID WC  . cilostazol  100 mg Oral BID AC  . diltiazem  60 mg Oral Q6H  . diphenhydrAMINE  50 mg Oral Q M,W,F  . enoxaparin (LOVENOX) injection  80 mg Subcutaneous Daily  . epoetin (EPOGEN/PROCRIT) injection  9,600 Units Intravenous Q M,W,F-HD  . isosorbide dinitrate  10 mg Oral TID  . levETIRAcetam  500 mg Oral Q2000  . levothyroxine  150 mcg Oral QAC breakfast  . multivitamin  1 tablet Oral QHS  . mupirocin ointment   Nasal BID  . pantoprazole  40 mg Oral Daily  . pravastatin  40 mg Oral QHS  . ranolazine  500 mg Oral BID  . sevelamer carbonate  1,600 mg Oral TID WC  . sevelamer carbonate  800 mg Oral BID BM   Continuous Infusions: . cefTAZidime (FORTAZ)  IV Stopped (04/17/17 1751)  . vancomycin       LOS: 4  days    Time spent in minutes: 35 min    Debbe Odea, MD [facility] Pager: [email or web address] Password [email or web address] 04/18/2017, 8:01 AM

## 2017-04-18 NOTE — Progress Notes (Addendum)
Progress Note  Patient Name: [name] Date of Encounter: 04/18/2017  Primary Cardiologist: Minus Breeding, MD   Subjective   Denies any CP or SOB.    Inpatient Medications    Scheduled Meds: . ambrisentan  5 mg Oral Daily  . aspirin EC  81 mg Oral Daily  . carvedilol  25 mg Oral BID WC  . cilostazol  100 mg Oral BID AC  . diltiazem  60 mg Oral Q6H  . diphenhydrAMINE  50 mg Oral Q M,W,F  . enoxaparin (LOVENOX) injection  80 mg Subcutaneous Daily  . epoetin (EPOGEN/PROCRIT) injection  9,600 Units Intravenous Q M,W,F-HD  . isosorbide dinitrate  10 mg Oral TID  . levETIRAcetam  500 mg Oral Q2000  . levothyroxine  150 mcg Oral QAC breakfast  . multivitamin  1 tablet Oral QHS  . mupirocin ointment   Nasal BID  . pantoprazole  40 mg Oral Daily  . pravastatin  40 mg Oral QHS  . ranolazine  500 mg Oral BID  . sevelamer carbonate  1,600 mg Oral TID WC  . sevelamer carbonate  800 mg Oral BID BM   Continuous Infusions: . cefTAZidime (FORTAZ)  IV 2 g (04/18/17 0940)  . vancomycin 750 mg (04/18/17 0939)   PRN Meds: acetaminophen, albuterol, calcitRIOL, cinacalcet, HYDROcodone-acetaminophen, morphine injection, nitroGLYCERIN, ondansetron (ZOFRAN) IV, traZODone   Vital Signs    Vitals:   04/18/17 0930 04/18/17 0935 04/18/17 1000 04/18/17 1030  BP: (!) 85/58 (!) 117/51 (!) 104/48 (!) 87/47  Pulse: 82 (!) 112 88 90  Resp: 19 16    Temp:      TempSrc:      SpO2:      Weight:      Height:        Intake/Output Summary (Last 24 hours) at 04/18/2017 1115 Last data filed at 04/17/2017 1930 Gross per 24 hour  Intake 480 ml  Output -426 ml  Net 906 ml   Filed Weights   04/17/17 1400 04/18/17 0636 04/18/17 0700  Weight: 177 lb 4 oz (80.4 kg) 178 lb 1.6 oz (80.8 kg) 179 lb 3.7 oz (81.3 kg)    Telemetry    Atrial fibrillation - Personally Reviewed  ECG    Atrial fibrillation with RVR - Personally Reviewed  Physical Exam   GEN: No acute distress.   Neck: No  JVD Cardiac: irregularly irregular, no murmurs, rubs, or gallops.  Respiratory: Clear to auscultation bilaterally. GI: Soft, nontender, non-distended  MS: No edema; No deformity. Neuro:  Nonfocal  Psych: Normal affect   Labs    Chemistry Recent Labs  Lab 04/17/17 0559 04/17/17 1026 04/18/17 0803  NA 134* 132* 133*  K 4.5 4.4 4.5  CL 97* 95* 97*  CO2 21* 22 23  GLUCOSE 86 103* 149*  BUN 45* 44* 39*  CREATININE 8.33* 8.29* 7.30*  CALCIUM 9.0 8.9 9.1  ALBUMIN 3.2* 3.1* 3.0*  GFRNONAA 4* 4* 5*  GFRAA 5* 5* 6*  ANIONGAP 16* 15 13     Hematology Recent Labs  Lab 04/17/17 0559 04/17/17 1026 04/18/17 0847  WBC 4.4 4.6 4.9  RBC 3.52* 3.54* 3.25*  HGB 9.4* 9.6* 8.8*  HCT 29.5* 29.3* 27.3*  MCV 83.8 82.8 84.0  MCH 26.7 27.1 27.1  MCHC 31.9 32.8 32.2  RDW 18.8* 18.5* 18.8*  PLT 144* 112* 133*    Cardiac Enzymes Recent Labs  Lab 04/14/17 2359 04/15/17 0556 04/15/17 1031 04/17/17 1620  TROPONINI 0.35* 0.61* 0.88* 0.33*  Recent Labs  Lab 04/14/17 1433  TROPIPOC 0.26*     BNP Recent Labs  Lab 04/14/17 1533  BNP 600.3*     DDimer No results for input(s): DDIMER in the last 168 hours.   Radiology    Dg Chest Port 1 View  Result Date: 04/17/2017 CLINICAL DATA:  72 year old female with end-stage renal disease on dialysis. Postop. Subsequent encounter. EXAM: PORTABLE CHEST 1 VIEW COMPARISON:  04/16/2017 and 11/20/2016 chest x-ray. FINDINGS: Catheter extends from below diaphragm courses over the heart with the tip projecting at the level of the distal superior vena cava just above the expected level of the cavoatrial junction. Cardiomegaly.  Mitral valve calcifications. Chronic lung changes with superimposed pulmonary vascular congestion. Calcified aorta. No pneumothorax. IMPRESSION: Catheter extends from below diaphragm courses over the heart with the tip projecting at the level of the distal superior vena cava just above the expected level of the cavoatrial  junction. Cardiomegaly. Mitral valve calcifications. Chronic lung changes with superimposed pulmonary vascular congestion. Aortic Atherosclerosis (ICD10-I70.0). Electronically Signed   By: Genia Del M.D.   On: 04/17/2017 08:49   Dg Fluoro Guide Cv Line-no Report  Result Date: 04/17/2017 Fluoroscopy was utilized by the requesting physician.  No radiographic interpretation.    Cardiac Studies   Echo 11/23/2016 LV EF: 55% -   60%  ------------------------------------------------------------------- Indications:      Atrial fibrillation - 427.31.  ------------------------------------------------------------------- History:   PMH:   Atrial fibrillation.  Coronary artery disease. PMH:   Stroke.  Risk factors:  Current tobacco use. Hypertension. Dyslipidemia.  ------------------------------------------------------------------- Study Conclusions  - Left ventricle: The cavity size was normal. Wall thickness was   increased in a pattern of mild LVH. Systolic function was normal.   The estimated ejection fraction was in the range of 55% to 60%.   Wall motion was normal; there were no regional wall motion   abnormalities. Doppler parameters are consistent with high   ventricular filling pressure. - Aortic valve: There was mild stenosis. - Mitral valve: Severely calcified annulus. The findings are   consistent with mild stenosis. There was mild regurgitation.   Valve area by pressure half-time: 2.02 cm^2. - Left atrium: The atrium was severely dilated. - Right atrium: The atrium was mildly dilated. - Tricuspid valve: There was severe regurgitation. - Pulmonary arteries: Systolic pressure was severely increased. PA   peak pressure: 80 mm Hg (S). - Pericardium, extracardiac: A small pericardial effusion was   identified.  Impressions:  - Normal LV systolic function; mild LVH; calcified aortic valve   with mild AS (mean gradient 12 mmHg); severe MAC with mild MS and   mild MR;  biatrial enlargment; moderate to severe TR withi   severely elevated pulmonary pressure; small pericardial effusion.   LHC 11/2016 Conclusion    Severe diffuse calcific coronary artery disease.  Relatively focal distal left main of 40-50% stenosis.  Ostial circumflex 75-85%, forming a [place] 101 bifurcation stenosis with the LAD and left main. 75% distal circumflex.  Large, widely patent LAD.  Ostial RCA stent that overhangs into the ascending aorta. Selective engagement was not possible. Severe proximal to mid disease noted with images obtained.  LV is underfilled on hand injection, LVEDP is 25 mmHg, estimated overall EF is 50%. Findings are compatible with chronic combined diastolic heart failure.  RECOMMENDATIONS:   In comparing images with prior study from 2013, perhaps continued medical therapy is best. The right coronary cannot be treated percutaneously due to ostial stent overhang. Left main/LAD/circumflex  disease could be stented but would be complex and require indefinite dual antiplatelet therapy. Not sure that she is a surgical candidate given comorbidities and continued smoking.  Heart team approach.     Patient Profile     72 y.o. female w/ ESRD on HD pulmonary HTN, Essential HTN, HLD, CAD w/ previous RCA stenting, OSA and atrial fibrillation. She underwent recent vascular surgery for new HD access in left upper am, and presented back with post op pain and swelling. Pt also with new chest pain in the setting of atrial fibrillation w/ RVR, for which cardiology was consulted.   Assessment & Plan    1. Atrial fibrillation with RVR  - seen the patient after dialysis, HR 110s on coreg and PO diltiazem  - HR still not very well controlled, consider switch coreg to metoprolol for better rate control. SBP 90-100  - unable to use digoxin.   2. CAD: last cath 11/2016, not a PCI candidate, patient has been made DNR. Medical management of chest pain, on BB,  isosorbide dinititrate and ranexa  3. ESRD on HD MWF: underwent L femoral vein tunneled catheter placement.   4. Infected dialysis catheter: on IV abx, old TDC pulled. New patient placed in L femoral vein  For questions or updates, please contact [place] Please consult [email or web address] for contact info under Cardiology/STEMI.      Hilbert Corrigan, PA  04/18/2017, 11:15 AM    History and all data above reviewed.  Patient examined.  I agree with the findings as above.  The patient exam reveals WOE:[id number]  ,  Lungs: Clear  ,  Abd: Positive bowel sounds, no rebound no guarding, Ext No edema  .  All available labs, radiology testing, previous records reviewed. Agree with documented assessment and plan. Chest pain:  The troponin is trending down.  She continues to have chest pain but it is mild now.  Medical management.  We will continue to work on titrating her meds to see if we can bring down her atrial fib rate and control the BP.  I changed to all short acting Cardizem yesterday and she did receive three of four doses at an increased dose.  I will stop Coreg and change to metoprolol tartrate today.  I would suggest that she cannot go home yet.     Jeneen Rinks Keyarah Mcroy  11:59 AM  04/18/2017

## 2017-04-18 NOTE — Progress Notes (Signed)
Patient complaining of constipation.  Has not had a bowel movement since 04/15/2017 per patient report.  [facility] paged with this information.

## 2017-04-18 NOTE — Care Management Important Message (Signed)
Important Message  Patient Details  Name: [name] MRN: [id number] Date of Birth: [birth year]   Medicare Important Message Given:  Yes    [name] [name] 04/18/2017, 10:03 AM

## 2017-04-18 NOTE — Progress Notes (Signed)
[facility] Progress Note   Subjective:  No problems today, on HD now , tolerating well  Objective Vitals:   04/18/17 1000 04/18/17 1030 04/18/17 1104 04/18/17 1153  BP: (!) 104/48 (!) 87/47 100/67 126/79  Pulse: 88 90 90 (!) 134  Resp: 18 18 18 19   Temp:   98 F (36.7 C) (!) 97.5 F (36.4 C)  TempSrc:   Oral Oral  SpO2:   98% 96%  Weight:   80 kg (176 lb 5.9 oz)   Height:       Physical Exam General:NAD, WDWN female, sitting in bed Heart:regular rate, irregular rhythm  Lungs:CTAB Abdomen:soft, NTND Extremities:no LE edema Dialysis Access: L thigh TDC, LU AVG +b/t   Filed Weights   04/18/17 0636 04/18/17 0700 04/18/17 1104  Weight: 80.8 kg (178 lb 1.6 oz) 81.3 kg (179 lb 3.7 oz) 80 kg (176 lb 5.9 oz)    Intake/Output Summary (Last 24 hours) at 04/18/2017 1206 Last data filed at 04/18/2017 1104 Gross per 24 hour  Intake 720 ml  Output 1074 ml  Net -354 ml    Additional Objective Labs: Basic Metabolic Panel: Recent Labs  Lab 04/17/17 0559 04/17/17 1026 04/18/17 0803  NA 134* 132* 133*  K 4.5 4.4 4.5  CL 97* 95* 97*  CO2 21* 22 23  GLUCOSE 86 103* 149*  BUN 45* 44* 39*  CREATININE 8.33* 8.29* 7.30*  CALCIUM 9.0 8.9 9.1  PHOS 6.1* 6.2* 4.9*   Liver Function Tests: Recent Labs  Lab 04/17/17 0559 04/17/17 1026 04/18/17 0803  ALBUMIN 3.2* 3.1* 3.0*   CBC: Recent Labs  Lab 04/14/17 2359 04/15/17 0556 04/17/17 0559 04/17/17 1026 04/18/17 0847  WBC 4.4 3.9* 4.4 4.6 4.9  NEUTROABS 2.8  --  2.5  --   --   HGB 10.4* 9.4* 9.4* 9.6* 8.8*  HCT 31.4* 28.8* 29.5* 29.3* 27.3*  MCV 83.5 82.5 83.8 82.8 84.0  PLT 122* 120* 144* 112* 133*   Blood Culture    Component Value Date/Time   SDES BLOOD RIGHT HAND 04/16/2017 1021   SPECREQUEST  04/16/2017 1021    BOTTLES DRAWN AEROBIC AND ANAEROBIC Blood Culture adequate volume   CULT  04/16/2017 1021    NO GROWTH 1 DAY Performed at [facility], [address] [place], [place]  [address]    REPTSTATUS PENDING 04/16/2017 1021    Cardiac Enzymes: Recent Labs  Lab 04/14/17 2359 04/15/17 0556 04/15/17 1031 04/17/17 1620  TROPONINI 0.35* 0.61* 0.88* 0.33*   Iron Studies:  No results for input(s): IRON, TIBC, TRANSFERRIN, FERRITIN in the last 72 hours. Studies/Results: Dg Chest Port 1 View  Result Date: 04/17/2017 CLINICAL DATA:  72 year old female with end-stage renal disease on dialysis. Postop. Subsequent encounter. EXAM: PORTABLE CHEST 1 VIEW COMPARISON:  04/16/2017 and 11/20/2016 chest x-ray. FINDINGS: Catheter extends from below diaphragm courses over the heart with the tip projecting at the level of the distal superior vena cava just above the expected level of the cavoatrial junction. Cardiomegaly.  Mitral valve calcifications. Chronic lung changes with superimposed pulmonary vascular congestion. Calcified aorta. No pneumothorax. IMPRESSION: Catheter extends from below diaphragm courses over the heart with the tip projecting at the level of the distal superior vena cava just above the expected level of the cavoatrial junction. Cardiomegaly. Mitral valve calcifications. Chronic lung changes with superimposed pulmonary vascular congestion. Aortic Atherosclerosis (ICD10-I70.0). Electronically Signed   By: Genia Del M.D.   On: 04/17/2017 08:49   Dg Fluoro Guide Cv  Line-no Report  Result Date: 04/17/2017 Fluoroscopy was utilized by the requesting physician.  No radiographic interpretation.    Medications: . cefTAZidime (FORTAZ)  IV Stopped (04/18/17 1020)  . vancomycin Stopped (04/18/17 1139)   . ambrisentan  5 mg Oral Daily  . aspirin EC  81 mg Oral Daily  . carvedilol  25 mg Oral BID WC  . cilostazol  100 mg Oral BID AC  . diltiazem  60 mg Oral Q6H  . diphenhydrAMINE  50 mg Oral Q M,W,F  . enoxaparin (LOVENOX) injection  80 mg Subcutaneous Daily  . epoetin (EPOGEN/PROCRIT) injection  9,600 Units Intravenous Q M,W,F-HD  . isosorbide dinitrate  10 mg Oral  TID  . levETIRAcetam  500 mg Oral Q2000  . levothyroxine  150 mcg Oral QAC breakfast  . multivitamin  1 tablet Oral QHS  . mupirocin ointment   Nasal BID  . pantoprazole  40 mg Oral Daily  . pravastatin  40 mg Oral QHS  . ranolazine  500 mg Oral BID  . sevelamer carbonate  1,600 mg Oral TID WC  . sevelamer carbonate  800 mg Oral BID BM   Home meds: - coreg 25 bid/ dilt 120 qd/ hydral 25 tid - sl ntg prn/ isordil 20 tid - PPI/ sensipar 90/ desyrel/ T4/ norco prn/ pletal  - coumadin/ lovenox SQ - keppra - fosrenol 100mg  AC TID, 1 BID w/snacks   Dialysis Orders: MWF [place] 75kg   2/2 bath  P4  R thigh TDC (now removed) / maturing LUA AVG  Hep none (allergic) -Epogen 9600 Units IV qHD - Weekly dose 93570 -Sensipar 120mg  PO qHD -Calcitriol 2.77mch PO qHD     Assessment/Plan: 1. Chest pain/+troponin/ hx CAD w known w/RCA stent - last cath Nov, not candidate for further intervention per cardiology  2. Volume - up 5kg by wts, no SOB, UF 1-2 kg today on HD 3. R thigh TDC drainage  - sp removal 4/14 and replaced 4/16 in L thigh per [facility]. Blood cx's and drainage cx's negative. Afebrile, prob can just give 7-10 days empiric abx.   4. ESRD - HD MWF. Didn't tolerate HD 12/16 d/t chest pains, low BPs.Stable today 5. A fib w/RVR - per primary. No heparin d/t allergy, on po dilt/ coreg per cards 6. CM - LVEF 30%  7. Anemia of CKD- Hgb 9.6 s/p 2Unit pRBC, started epogen 9600 Unit qHD 8. MBD ckd  - Ca in goal. P 6.2. Cont Senispar, calcitriol.  Not tolerating Fosrenol, changed to Renvela 2AC TID, 1 w/snacks BID. 9. HTN - BP's on lower side partly due to afib meds (dilt/ coreg) 10. Nutrition - Renal diet with fluid restrictions.  11.  Seizure do - on keppra 12.  S/p new LUA AVG - placed on 4/4 by Dr. Bridgett Larsson, will be ready to use in about 2wks 13.  DNR - pt now DNR    Kelly Splinter MD [facility] pager [phone]   04/18/2017, 12:07 PM

## 2017-04-18 NOTE — Progress Notes (Signed)
   04/18/17 1810 04/18/17 1820  Vitals  BP (!) 82/56 128/67  MAP (mmHg) 65 84  BP Location Right Arm Right Arm  BP Method Automatic Automatic  Patient Position (if appropriate) Lying Sitting  ECG Heart Rate 95 99  Resp (!) 23 (!) 23   Hypotensive at the time Isordil, Metoprolol, and Diltiazem was due this evening.  Rechecked x 2 while laying down.  After patient sat on the side of the bed to eat, BP was rechecked.  Normotensive, so the above medications were administered.

## 2017-04-18 NOTE — Progress Notes (Signed)
ANTICOAGULATION CONSULT NOTE - Pharmacy Consult for Lovenox,   Resume Coumadin Indication: ACS  Allergies  Allergen Reactions  . Ace Inhibitors Anaphylaxis and Rash  . Heparin Other (See Comments)    MDs told her not to take after reaction in ICU. Tolerates Lovenox fine  . Iohexol Swelling and Other (See Comments)    1970s; passed out and had facial/tongue swelling.  Requires 13-hour prep with prednisone and benadryl  . Phenytoin Other (See Comments)    Had reaction while in ICU; doesn't know.  MDs told her not to take ever again.  . Wellbutrin [Bupropion] Other (See Comments)    seizures  . Meperidine Hcl Swelling and Rash    Makes tongue swell  . Morphine Rash  . Penicillins Hives and Rash    Has patient had a PCN reaction causing immediate rash, facial/tongue/throat swelling, SOB or lightheadedness with hypotension: Yes Has patient had a PCN reaction causing severe rash involving mucus membranes or skin necrosis: No Has patient had a PCN reaction that required hospitalization: No Has patient had a PCN reaction occurring within the last 10 years: No If all of the above answers are "NO", then may proceed with Cephalosporin use.    . Valproic Acid And Related Other (See Comments)    Confusion   . Iodinated Diagnostic Agents Other (See Comments)    Pre-meditate with benadryl and prednisone 3 times before appt.  . Pentazocine Lactate Other (See Comments)    Patient does not remember reaction to this med ([place]).     Patient Measurements: Height: 5' 2.5" (158.8 cm) Weight: 176 lb 5.9 oz (80 kg) IBW/kg (Calculated) : 51.25 Heparin Dosing Weight: 80 kg  Vital Signs: Temp: 97.5 F (36.4 C) (04/17 1153) Temp Source: Oral (04/17 1153) BP: 126/79 (04/17 1153) Pulse Rate: 134 (04/17 1153)  Labs: Recent Labs    04/17/17 0559 04/17/17 1026 04/17/17 1620 04/18/17 0803 04/18/17 0847  HGB 9.4* 9.6*  --   --  8.8*  HCT 29.5* 29.3*  --   --  27.3*  PLT 144* 112*  --   --   133*  LABPROT  --   --   --  20.6*  --   INR  --   --   --  1.79  --   CREATININE 8.33* 8.29*  --  7.30*  --   TROPONINI  --   --  0.33*  --   --     Estimated Creatinine Clearance: 6.9 mL/min (A) (by C-G formula based on SCr of 7.3 mg/dL (H)).   Medical History: Past Medical History:  Diagnosis Date  . Anemia    History  of Blood transfusion  . Asthma    PMH  . Atrial fibrillation with RVR ([place]) 11/2016  . CAD (coronary artery disease)    stent to RCA  . Cancer ([place])    clear cell cancer, kidney  . Complication of anesthesia 12/2010   pt is very confused, with AMS with anesthesia  . CVA (cerebral infarction) 2003   no apparent residual  . Dyslipidemia   . Encephalopathy   . ESRD 07/27/2008   ESRD due to HTN and NSAID's, started hemodialysis in 2005 in [place], [place]. Went to [facility] from 2010 to 2012 and since 2012 has been getting dialysis at [facility] on [address] in [place] on a MWF schedule. First access with RUA AVG placed in [place]. Next and current access was LUA AVG placed by Dr. Lucky Cowboy in [place] in  or around 2012. Has had 2 or 3 procedures on that graft since placed per family. She gets her access work done here in [place] now. She is allergic to heparin and does not get any heparin at dialysis; she had an allergic reaction apparently when in ICU in the past.      . GERD (gastroesophageal reflux disease)   . Hyperlipidemia   . Hypertension   . Hypothyroidism   . Myocardial infarction ([place])    per pt  . Positive PPD    completed rifampin  . Pulmonary hypertension ([place])   . PVD (peripheral vascular disease) ([place])   . Seizures ([place])    last seizure 6 years ago  . Sleep apnea    Sleep Study 2008  . Traumatic seroma of left lower leg ([place])   . Wears partial dentures     Assessment: Pt presented 4/13 with chest pain in the setting of aFib with RVR , and post op pain, swelling s/p right femoral  TDC on 4/9. Infected R femoral TDC cath removed  4/16 and  s/p new left femoral TDC placed 04/17/17.  PMH: A fib, HTN, HLD, CVA with previous RCA stenting, PVD, cancer, ESRD-HD MWF, MI.  Pharmacy consulted to restart Lovenox after new [place] placed  On 04/17/17. This patient had been refusing lovenox due to h/o a reaction to heparin in past, unknown reaction. No HIT documented.  Post HD 04/17/17 RN was able to give patient Lovenox.  She was taking chronic coumadin PTA  for h/o Afib. Coumadin has been held for HD cath placement procedure.  Pharmacy consulted today 4/17 to resume Coumadin for h/o Afib. Cardiology notes CAD last cath 11/2016, not a PCI candidate. Patient has been made DNR. Medical management.  INR = 1.79 which is increased from 1.61 on 4/14 while coumadin on hold (last taken pta 4/13 10 AM. Hgb 9.2 on admit 4/9, >> 9.6>8.8,  PLTC 138 on admit > 122>120>144>112k> back up to 133 today. Pltc has been 130-150s since Nov 2018. Weight = 80 kg,  ESRD-HD qMWF No bleeding reported.   PTA  Coumadin dose:  Resumed   4/10 after procedure on 4/9 at dose 11.25mg  4/10, 4/11, 4/12  Then 4/13 7.5mg  daily.   Prior to this patient was taking 7.5 mg daily with INR = 2.5 on 04/03/17.  Goal of Therapy:  4-hour Heparin Level Goal of 0.6-1 unit/mL  INR 2-3  Plan:  Continue Lovenox 80 mg sq daily  Restart coumadin today with 7.5 mg po x1 Monitor bleeding, signs and sx of clot, platelet counts on CBC Daily Protime/INR  Nicole Cella, RPh Clinical Pharmacist Pager: [phone] or [phone] (330p-1030p) Main [facility] [phone] 04/18/2017,12:14 PM

## 2017-04-19 DIAGNOSIS — I5032 Chronic diastolic (congestive) heart failure: Secondary | ICD-10-CM

## 2017-04-19 DIAGNOSIS — T827XXA Infection and inflammatory reaction due to other cardiac and vascular devices, implants and grafts, initial encounter: Secondary | ICD-10-CM

## 2017-04-19 DIAGNOSIS — I272 Pulmonary hypertension, unspecified: Secondary | ICD-10-CM

## 2017-04-19 DIAGNOSIS — G40909 Epilepsy, unspecified, not intractable, without status epilepticus: Secondary | ICD-10-CM

## 2017-04-19 DIAGNOSIS — R079 Chest pain, unspecified: Secondary | ICD-10-CM

## 2017-04-19 DIAGNOSIS — T827XXS Infection and inflammatory reaction due to other cardiac and vascular devices, implants and grafts, sequela: Secondary | ICD-10-CM

## 2017-04-19 LAB — CULTURE, BLOOD (ROUTINE X 2)
CULTURE: NO GROWTH
Culture: NO GROWTH

## 2017-04-19 LAB — PROTIME-INR
INR: 1.32
Prothrombin Time: 16.2 seconds — ABNORMAL HIGH (ref 11.4–15.2)

## 2017-04-19 MED ORDER — WARFARIN SODIUM 7.5 MG PO TABS: 7.5000 mg | ORAL_TABLET | Freq: Once | ORAL | Status: DC

## 2017-04-19 MED ORDER — HYDROCORTISONE ACETATE 25 MG RE SUPP: 25.0000 mg | Freq: Two times a day (BID) | RECTAL | 0 refills | Status: DC

## 2017-04-19 MED ORDER — VANCOMYCIN HCL IN DEXTROSE 750-5 MG/150ML-% IV SOLN: 750.0000 mg | INTRAVENOUS | Status: DC

## 2017-04-19 MED ORDER — ASPIRIN 81 MG PO TBEC: 81.0000 mg | DELAYED_RELEASE_TABLET | Freq: Every day | ORAL | 0 refills | Status: DC

## 2017-04-19 MED ORDER — SODIUM CHLORIDE 0.9 % IV SOLN: 2.0000 g | INTRAVENOUS | Status: DC

## 2017-04-19 MED ORDER — DILTIAZEM HCL 60 MG PO TABS: 60.0000 mg | ORAL_TABLET | Freq: Three times a day (TID) | ORAL | 0 refills | Status: DC

## 2017-04-19 MED ORDER — RENA-VITE PO TABS: 1.0000 | ORAL_TABLET | Freq: Every day | ORAL | 0 refills | Status: DC

## 2017-04-19 MED ORDER — SENNOSIDES-DOCUSATE SODIUM 8.6-50 MG PO TABS: 2.0000 | ORAL_TABLET | Freq: Every evening | ORAL | 0 refills | Status: DC | PRN

## 2017-04-19 MED ORDER — HYDROCORTISONE ACETATE 25 MG RE SUPP
25.0000 mg | Freq: Two times a day (BID) | RECTAL | Status: DC
  Filled 2017-04-19: qty 1

## 2017-04-19 MED ORDER — METOPROLOL TARTRATE 50 MG PO TABS: 50.0000 mg | ORAL_TABLET | Freq: Two times a day (BID) | ORAL | Status: DC

## 2017-04-19 MED ORDER — DIGOXIN 125 MCG PO TABS: 0.1250 mg | ORAL_TABLET | ORAL | Status: DC

## 2017-04-19 MED ORDER — WARFARIN SODIUM 2.5 MG PO TABS: ORAL_TABLET | ORAL | 0 refills | Status: DC

## 2017-04-19 MED ORDER — RANOLAZINE ER 500 MG PO TB12: 500.0000 mg | ORAL_TABLET | Freq: Two times a day (BID) | ORAL | 0 refills | Status: DC

## 2017-04-19 MED ORDER — DIGOXIN 125 MCG PO TABS: 0.1250 mg | ORAL_TABLET | ORAL | 0 refills | Status: DC

## 2017-04-19 NOTE — Care Management Note (Signed)
Case Management Note  Patient Details  Name: JOELLE ROSWELL MRN: [id number] Date of Birth: [birth year]  Subjective/Objective:  Pt presented for post op pain swelling and new onset chest pain and Afib RVR. Cardiology tweaked medications. ESRD-On HD. Plan to transition home today. Pt previously active with [facility]. PTA pt used [facility] occaisionaly.                   Action/Plan: CM did call [facility] Liaison Sharyn Lull and she is aware that pt will transition home today. CM did reach out to [facility] to see if they have community resources for personal care services. CM awaiting call back from Liaison. No further needs at this time.   Expected Discharge Date:  04/19/17               Expected Discharge Plan:  [place]  In-House Referral:  NA  Discharge planning Services  CM Consult  Post Acute Care Choice:  Home Health Choice offered to:  Patient  DME Arranged:  N/A DME Agency:  NA  HH Arranged:  RN, Disease Management, Nurse's Aide HH Agency:  [facility]  Status of Service:  Completed, signed off  If discussed at [place] of Stay Meetings, dates discussed:    Additional Comments:  Bethena Roys, RN 04/19/2017, 1:14 PM

## 2017-04-19 NOTE — Consult Note (Signed)
   [facility] CM Inpatient Consult   04/19/2017  Sharnita Bogucki [place] January 14, 1945 [id number]  Referral received, spoke with inpatient RNCM Jacqlyn Krauss regarding patient with post hospital needs. Met with the patient at the bedside, patient was dressing herself for her return home today,  with granddaughter in attendance and approval.  Explained [facility] Care Management services.  She endorses Dr. Glendale Chard as her primary care provider.  She states she uses [facility] for medications, and she states she has no resource needs as her daughter and granddaughter helps her a lot. Patient did accept a brochure and contact information.  Encouraged her to contact the 24 hour nurse advise line.  A magnet was provided as well and given to the granddaughter.  She states she goes to dialysis on MWF schedule at [place] location.  No needs expressed.  For questions, please contact:  Natividad Brood, RN BSN [facility] Liaison  [phone] business mobile phone Toll free office [phone]

## 2017-04-19 NOTE — Progress Notes (Signed)
Discharge order obtained.  IV removed intact, telemetry monitor removed.  Reviewed AVS with patient, including medications, activity/restrictions, follow-up appointments.  Patient and family verbalized understanding.  Questions asked and answered.  Belongings given to patient.  Copy of AVS signature form placed in chart.  Awaiting granddaughter's arrival (she is bringing patient clothes for discharge).

## 2017-04-19 NOTE — Discharge Instructions (Signed)
Your cardiology has recommended Cardizem 3 x a day. Check BP and if top number is < 90, skip that dose and then check BP again prior to next dose.   You were cared for by a hospitalist during your hospital stay. If you have any questions about your discharge medications or the care you received while you were in the hospital after you are discharged, you can call the unit and asked to speak with the hospitalist on call if the hospitalist that took care of you is not available. Once you are discharged, your primary care physician will handle any further medical issues. Please note that NO REFILLS for any discharge medications will be authorized once you are discharged, as it is imperative that you return to your primary care physician (or establish a relationship with a primary care physician if you do not have one) for your aftercare needs so that they can reassess your need for medications and monitor your lab values.  Please take all your medications with you for your next visit with your Primary MD. Please ask your Primary MD to get all Hospital records sent to his/her office. Please request your Primary MD to go over all hospital test results at the follow up.    If you experience worsening of your admission symptoms, develop shortness of breath, chest pain, suicidal or homicidal thoughts or a life threatening emergency, you must seek medical attention immediately by calling 911 or calling your MD.   Dennis Bast must read the complete instructions/literature along with all the possible adverse reactions/side effects for all the medicines you take including new medications that have been prescribed to you. Take new medicines after you have completely understood and accpet all the possible adverse reactions/side effects.    Do not drive when taking pain medications or sedatives.     Do not take more than prescribed Pain, Sleep and Anxiety Medications   If you have smoked or chewed Tobacco in the last 2 yrs  please stop. Stop any regular alcohol  and or recreational drug use.   Wear Seat belts while driving.

## 2017-04-19 NOTE — Discharge Summary (Signed)
Physician Discharge Summary  [name] NID:[id number] DOB: 1945/09/14 DOA: 04/14/2017  PCP: Glendale Chard, MD  Admit date: 04/14/2017 Discharge date: 04/19/2017  Admitted From: home Disposition:  home   Recommendations for Outpatient Follow-up:  1. Last dose of Ceftaz and Vanc will be this Friday on dialysis 2. INR on Monday to be drawn on Home health and sent to [facility] at the Coumadin clinic- adjust dose of Coumadin as needed- will be on 5 mg daily for now 3. She will have an appt with Almyra Deforest for next Wed- I have asked him to f/u on GI bleed which is likely hemorroids  Home Health:  RN for med management and Aid    Discharge Condition:  stable   CODE STATUS:  Full code   Consultations:  Nephrology   Cardiology    Vascular surgery   Discharge Diagnoses:  Principal Problem:   Atrial fibrillation with RVR ([place]) Active Problems:   Infection of hemodialysis tunneled catheter    Chest pain at rest   Essential hypertension - labile   Chronic diastolic heart failure ([place])   ESRD   Epilepsy ([place])   Pulmonary hypertension ([place])     Subjective: Had no complaints for me this AM. Has been asking to go home since yesterday. Later RN noted the blood on the toilet paper- see below.  Brief Summary: [name] is an 72 y.o.femalewith past medical history significant for end-stage renal disease on hemodialysis, coronary artery disease, systolic congestive heart failure, pulmonary hypertension, atrial fibrillation on warfarin who presented for chest pain without shortness of breath, nausea vomiting or diaphoresis Noted to have A-fib with RVR, elevated troponin but not acute EKG changes. She was anemic with Hb of 7.6 and admisson and was transfused 2 U PRBC the same day. Also was having fevers and on admission noted to have an infected [facility] which had just been placed on 4/9. Vascular surgery consulted on 4/14 and [facility] was removed the same day. A line holiday was  given and a left femoral tunneled cath placed on 4/16.  Hospital Course:  Principal Problem:  Infected TDC in right thigh - now has left femoral tunneled cath in left thigh(placed 4/16) after line holiday - left arm AVg will b ready to use in end of April per renal -cultures from 4/13 and 4/15 negative - nephrology suggests 7-10 days of antibiotics- started on 4/13 and being given with dialysis- stop date would be on 4/20  Chest pain and slightly elevated troponin in ESRD with known CAD  Ref. Range 04/14/2017 23:59 04/15/2017 05:56 04/15/2017 10:31 04/17/2017 16:20  Troponin I Latest Ref Range: <0.03 ng/mL 0.35 (HH) 0.61 (HH) 0.88 (HH) 0.33 (HH)  - has RCA stent  - cath in 11/18 > Severe diffuse calcific coronary artery disease with patent stent - medical management recommended again at that time  - patient declines a cath and has changed code status to DNR  - ASA, Cardizem, Isosorbide dinitrate  Active Problems:   Atrial fibrillation with RVR / Labile BP - BP making control of A-fib challenging  I have spoken with Dr Percival Spanish just now. He recommends,  Cardizem every 8 hrs- check BP prior to dose and hold if SBP < 90 - Coreg 25 mg BID changed to Metoprolol 50 BID which Dr Percival Spanish would like to transitioned to Toprol XL - Coumadin was on hold - on full dose Lovenox for line placement- I resumed Coumadin yesterday- INR is 1.32  Bright red blood  on tissue- possibly hemorrhoids  - new start on Asprin this admission - per RN: she had not had a BM in days and today she had a normal BM with no blood but a small amount of bright blood on the tissues after she wiped - Lovenox is at full dose and will be held on d/c today.  -Coumadin will continue and INR will drift up over the next few days - will order Anusol suppositories for her to use as this seems to be hemorrhoidal -  d/w Dr Percival Spanish and Almyra Deforest, PA    Chronic systolic CHF- severe pulm HTN with pressure of 80- mod to severe TR -EF  30% but improved to 50-55 % in 11/18 - [place] for pulm HTN    ESRD- 2nd hyperpara - typical dialysis days are MWF - [facility]  Seizure disorder - Keppra  Anemia in chronic renal disease - transfusion given as mentioned above - Epogen and Iron per renal team    Discharge Exam: Vitals:   04/19/17 0812 04/19/17 1230  BP:  111/69  Pulse: (!) 121   Resp:  (!) 27  Temp: 97.7 F (36.5 C)   SpO2: 99%    Vitals:   04/19/17 0520 04/19/17 0535 04/19/17 0812 04/19/17 1230  BP: 98/65   111/69  Pulse: (!) 134 98 (!) 121   Resp: 19   (!) 27  Temp: 98.2 F (36.8 C)  97.7 F (36.5 C)   TempSrc: Oral  Oral   SpO2: 90%  99%   Weight: 81.2 kg (179 lb)     Height:        General: Pt is alert, awake, not in acute distress Cardiovascular: IIRR, S1/S2 +, no rubs, no gallops Respiratory: CTA bilaterally, no wheezing, no rhonchi Abdominal: Soft, NT, ND, bowel sounds + Extremities: no edema, no cyanosis   Discharge Instructions  Discharge Instructions    Diet general   Complete by:  As directed    Renal, heart healthy diet   Increase activity slowly   Complete by:  As directed      Allergies as of 04/19/2017      Reactions   Ace Inhibitors Anaphylaxis, Rash   Heparin Other (See Comments)   MDs told her not to take after reaction in ICU. Tolerates Lovenox fine   Iohexol Swelling, Other (See Comments)   1970s; passed out and had facial/tongue swelling.  Requires 13-hour prep with prednisone and benadryl   Phenytoin Other (See Comments)   Had reaction while in ICU; doesn't know.  MDs told her not to take ever again.   Wellbutrin [bupropion] Other (See Comments)   seizures   Meperidine Hcl Swelling, Rash   Makes tongue swell   Morphine Rash   Penicillins Hives, Rash   Has patient had a PCN reaction causing immediate rash, facial/tongue/throat swelling, SOB or lightheadedness with hypotension: Yes Has patient had a PCN reaction causing severe rash involving mucus membranes  or skin necrosis: No Has patient had a PCN reaction that required hospitalization: No Has patient had a PCN reaction occurring within the last 10 years: No If all of the above answers are "NO", then may proceed with Cephalosporin use.   Valproic Acid And Related Other (See Comments)   Confusion   Iodinated Diagnostic Agents Other (See Comments)   Pre-meditate with benadryl and prednisone 3 times before appt.   Pentazocine Lactate Other (See Comments)   Patient does not remember reaction to this med ([place]win).  Medication List    STOP taking these medications   carvedilol 25 MG tablet Commonly known as:  COREG   diltiazem 120 MG 24 hr capsule Commonly known as:  CARDIZEM CD   enoxaparin 80 MG/0.8ML injection Commonly known as:  LOVENOX   hydrALAZINE 25 MG tablet Commonly known as:  APRESOLINE     TAKE these medications   acetaminophen 500 MG tablet Commonly known as:  TYLENOL Take 1,000 mg by mouth daily as needed for mild pain.   albuterol 108 (90 Base) MCG/ACT inhaler Commonly known as:  PROVENTIL HFA;VENTOLIN HFA Inhale 1-2 puffs into the lungs every 6 (six) hours as needed for wheezing or shortness of breath.   aspirin 81 MG EC tablet Take 1 tablet (81 mg total) by mouth daily. Start taking on:  04/20/2017   cefTAZidime 2 g in sodium chloride 0.9 % 100 mL Inject 2 g into the vein every Monday, Wednesday, and Friday with hemodialysis for 1 day. Start taking on:  04/21/2017   cilostazol 100 MG tablet Commonly known as:  PLETAL Take 1 tablet (100 mg total) by mouth 2 (two) times daily before a meal.   digoxin 0.125 MG tablet Commonly known as:  LANOXIN Take 1 tablet (0.125 mg total) by mouth 3 (three) times a week.   diltiazem 60 MG tablet Commonly known as:  CARDIZEM Take 1 tablet (60 mg total) by mouth 3 (three) times daily. Take BP prior to each dose and Hold if SBP < 90   diphenhydrAMINE 50 MG capsule Commonly known as:  BENADRYL Take 1 capsule (50 mg  total) by mouth every Monday, Wednesday, and Friday. On dialysis days   folic acid-vitamin b complex-vitamin c-selenium-zinc 3 MG Tabs tablet Take 1 tablet by mouth daily.   HYDROcodone-acetaminophen 5-325 MG tablet Commonly known as:  NORCO Take 1 tablet by mouth every 6 (six) hours as needed for moderate pain.   hydrocortisone 25 MG suppository Commonly known as:  ANUSOL-HC Place 1 suppository (25 mg total) rectally 2 (two) times daily.   isosorbide dinitrate 20 MG tablet Commonly known as:  ISORDIL Take 1 tablet (20 mg total) by mouth 3 (three) times daily.   LETAIRIS 5 MG tablet Generic drug:  ambrisentan TAKE 1 TABLET (5MG ) BY MOUTH DAILY. DO NOT HANDLE IF PREGNANT. DO NOTSPLIT, CRUSH, OR CHEW. AVOID INHALATION AND CONTACT WITH SKIN OR EYES.   levETIRAcetam 500 MG tablet Commonly known as:  KEPPRA Take 500 mg by mouth daily at 8 pm.   levothyroxine 150 MCG tablet Commonly known as:  SYNTHROID, LEVOTHROID Take 150 mcg by mouth daily before breakfast.   multivitamin Tabs tablet Take 1 tablet by mouth at bedtime.   nitroGLYCERIN 0.4 MG SL tablet Commonly known as:  NITROSTAT Place 1 tablet (0.4 mg total) under the tongue every 5 (five) minutes as needed for chest pain.   omeprazole 20 MG tablet Commonly known as:  PRILOSEC OTC Take 20 mg by mouth daily.   pravastatin 40 MG tablet Commonly known as:  PRAVACHOL Take 1 tablet (40 mg total) by mouth at bedtime.   ranolazine 500 MG 12 hr tablet Commonly known as:  RANEXA Take 1 tablet (500 mg total) by mouth 2 (two) times daily.   RENVELA 800 MG tablet Generic drug:  sevelamer carbonate Take 1,600 mg by mouth 3 (three) times daily with meals. May take 1600 mg with each snack   senna-docusate 8.6-50 MG tablet Commonly known as:  Senokot-S Take 2 tablets by mouth at  bedtime as needed for mild constipation or moderate constipation.   SENSIPAR 90 MG tablet Generic drug:  cinacalcet Take 90 mg by mouth every Monday,  Wednesday, and Friday with hemodialysis.   traZODone 50 MG tablet Commonly known as:  DESYREL Take 50 mg by mouth at bedtime as needed for sleep.   Vancomycin 750-5 MG/150ML-% Soln Commonly known as:  VANCOCIN Inject 150 mLs (750 mg total) into the vein every Monday, Wednesday, and Friday with hemodialysis for 1 day. Start taking on:  04/21/2017   warfarin 2.5 MG tablet Commonly known as:  COUMADIN Take as directed. If you are unsure how to take this medication, talk to your nurse or doctor. Original instructions:  Take 5 mg daily and have INR checked on Monday What changed:    medication strength  additional instructions      Follow-up Information    Almyra Deforest, PA Follow up on 04/25/2017.   Specialties:  Cardiology, Radiology Why:  Cardiology office visit with Dr. Rosezella Florida PA at 1:30PM, your coumadin will be checked during the office visit as well. Please arrive by 1:20PM.  Contact information: [address] [place] [address] [phone]        [facility] [address] Follow up on 04/25/2017.   Specialty:  Cardiology Why:  1:30PM. Coumadin clinic visit.  Contact information: [address] [phone] [place] [phone]       [facility] Follow up.   Specialty:  Home Health Services Why:  Registered Nurse, Physical Therapy Contact information: [place] [address] [phone]        Glendale Chard, MD Follow up in 1 week(s).   Specialty:  Internal Medicine Contact information: [address] [place] [address] [phone]        Minus Breeding, MD .   Specialty:  Cardiology Contact information: [address] [place] [address] [phone]          Allergies  Allergen Reactions  . Ace Inhibitors Anaphylaxis and Rash  . Heparin Other (See Comments)    MDs told her not to take after reaction  in ICU. Tolerates Lovenox fine  . Iohexol Swelling and Other (See Comments)    1970s; passed out and had facial/tongue swelling.  Requires 13-hour prep with prednisone and benadryl  . Phenytoin Other (See Comments)    Had reaction while in ICU; doesn't know.  MDs told her not to take ever again.  . Wellbutrin [Bupropion] Other (See Comments)    seizures  . Meperidine Hcl Swelling and Rash    Makes tongue swell  . Morphine Rash  . Penicillins Hives and Rash    Has patient had a PCN reaction causing immediate rash, facial/tongue/throat swelling, SOB or lightheadedness with hypotension: Yes Has patient had a PCN reaction causing severe rash involving mucus membranes or skin necrosis: No Has patient had a PCN reaction that required hospitalization: No Has patient had a PCN reaction occurring within the last 10 years: No If all of the above answers are "NO", then may proceed with Cephalosporin use.    . Valproic Acid And Related Other (See Comments)    Confusion   . Iodinated Diagnostic Agents Other (See Comments)    Pre-meditate with benadryl and prednisone 3 times before appt.  . Pentazocine Lactate Other (See Comments)    Patient does not remember reaction to this med ([place]win).      Procedures/Studies:  4/14. Right thigh TDC removal 4/16- left femoral TDC placed  Dg Chest 2 View  Result Date: 04/14/2017 CLINICAL DATA:  Chest pain today EXAM: CHEST - 2 VIEW COMPARISON:  Chest x-rays dated 04/10/2017 and 01/05/2017. FINDINGS: Stable cardiomegaly. Aortic atherosclerosis. Central catheter from femoral pro ches stable in position with tip at the expected level of the cavoatrial junction. Diffuse interstitial prominence is stable compared to recent exam, increased compared to an earlier chest x-ray of 10/06/2013. no confluent opacity to suggest a consolidating pneumonia. No pleural effusion or pneumothorax seen. No acute or suspicious osseous finding. IMPRESSION: 1. Cardiomegaly with  persistent interstitial prominence, presumed interstitial edema, compatible with chronic mild CHF. No evidence of overt alveolar pulmonary edema. No evidence of pneumonia. 2. Aortic atherosclerosis. Electronically Signed   By: Franki Cabot M.D.   On: 04/14/2017 14:59   Dg Chest Port 1 View  Result Date: 04/17/2017 CLINICAL DATA:  72 year old female with end-stage renal disease on dialysis. Postop. Subsequent encounter. EXAM: PORTABLE CHEST 1 VIEW COMPARISON:  04/16/2017 and 11/20/2016 chest x-ray. FINDINGS: Catheter extends from below diaphragm courses over the heart with the tip projecting at the level of the distal superior vena cava just above the expected level of the cavoatrial junction. Cardiomegaly.  Mitral valve calcifications. Chronic lung changes with superimposed pulmonary vascular congestion. Calcified aorta. No pneumothorax. IMPRESSION: Catheter extends from below diaphragm courses over the heart with the tip projecting at the level of the distal superior vena cava just above the expected level of the cavoatrial junction. Cardiomegaly. Mitral valve calcifications. Chronic lung changes with superimposed pulmonary vascular congestion. Aortic Atherosclerosis (ICD10-I70.0). Electronically Signed   By: Genia Del M.D.   On: 04/17/2017 08:49   Dg Chest Port 1 View  Result Date: 04/16/2017 CLINICAL DATA:  Pulmonary edema. EXAM: PORTABLE CHEST 1 VIEW COMPARISON:  Radiographs of April 14, 2017. FINDINGS: Stable cardiomegaly. Atherosclerosis of thoracic aorta is noted. No pneumothorax or pleural effusion is noted. Stable diffuse interstitial prominence is noted which may represent chronic scarring, but superimposed acute edema or inflammation cannot be excluded. Bony thorax is unremarkable. IMPRESSION: Stable diffuse interstitial prominence is noted which may represent chronic scarring, but superimposed acute edema or inflammation cannot be excluded. Aortic Atherosclerosis (ICD10-I70.0).  Electronically Signed   By: Marijo Conception, M.D.   On: 04/16/2017 07:34   Dg Chest Port 1 View  Result Date: 04/10/2017 CLINICAL DATA:  Dialysis catheter placement EXAM: PORTABLE CHEST 1 VIEW COMPARISON:  January 05, 2017 FINDINGS: Central catheter placed from a femoral approach has its tip at the superior vena cava-right atrium junction. No pneumothorax. There is diffuse interstitial prominence, likely interstitial edema. No consolidation. There is cardiomegaly with pulmonary venous hypertension. No adenopathy. There is aortic atherosclerosis. No bone lesions. There is calcification in both carotid arteries. IMPRESSION: Inferiorly placed central catheter tip at superior vena cava-right atrium junction. No pneumothorax. Evidence of chronic congestive heart failure. No consolidation. There is aortic atherosclerosis. There is calcification in each carotid artery. Aortic Atherosclerosis (ICD10-I70.0). Electronically Signed   By: Lowella Grip III M.D.   On: 04/10/2017 10:04   Dg Fluoro Guide Cv Line-no Report  Result Date: 04/17/2017 Fluoroscopy was utilized by the requesting physician.  No radiographic interpretation.   Dg Fluoro Guide Cv Line-no Report  Result Date: 04/10/2017 Fluoroscopy was utilized by the requesting physician.  No radiographic interpretation.     The results of significant diagnostics from this hospitalization (including imaging, microbiology, ancillary and laboratory) are listed below for reference.  Microbiology: Recent Results (from the past 240 hour(s))  Aerobic Culture (superficial specimen)     Status: None (Preliminary result)   Collection Time: 04/14/17  6:45 PM  Result Value Ref Range Status   Specimen Description WOUND RIGHT LEG  Final   Special Requests Immunocompromised  Final   Gram Stain   Final    RARE WBC PRESENT, PREDOMINANTLY PMN NO ORGANISMS SEEN    Culture   Final    RARE MOLD ISOLATE REFERRED FOR ID ONLY Performed at [facility], [address] [place], [place] [address]    Report Status PENDING  Incomplete  Culture, blood (routine x 2)     Status: None (Preliminary result)   Collection Time: 04/14/17  7:15 PM  Result Value Ref Range Status   Specimen Description BLOOD HEMODIALYSIS CATHETER  Final   Special Requests   Final    BOTTLES DRAWN AEROBIC AND ANAEROBIC Blood Culture results may not be optimal due to an excessive volume of blood received in culture bottles   Culture   Final    NO GROWTH 4 DAYS Performed at [facility], [place] [address], [place], [place] [address]    Report Status PENDING  Incomplete  Culture, blood (routine x 2)     Status: None (Preliminary result)   Collection Time: 04/14/17  7:20 PM  Result Value Ref Range Status   Specimen Description BLOOD HEMODIALYSIS CATHETER  Final   Special Requests   Final    BOTTLES DRAWN AEROBIC AND ANAEROBIC Blood Culture results may not be optimal due to an excessive volume of blood received in culture bottles   Culture   Final    NO GROWTH 4 DAYS Performed at [facility], [place] [address], [place], [place] [address]    Report Status PENDING  Incomplete  MRSA PCR Screening     Status: None   Collection Time: 04/15/17  1:06 AM  Result Value Ref Range Status   MRSA by PCR NEGATIVE NEGATIVE Final    Comment:        The GeneXpert MRSA Assay (FDA approved for NASAL specimens only), is one component of a comprehensive MRSA colonization surveillance program. It is not intended to diagnose MRSA infection nor to guide or monitor treatment for MRSA infections. Performed at [facility], [place] [address], [place], [place] [address]   Culture, blood (Routine X 2) w Reflex to ID Panel     Status: None (Preliminary result)   Collection Time: 04/16/17 10:14 AM  Result Value Ref Range Status   Specimen Description BLOOD RIGHT ARM  Final   Special Requests   Final    BOTTLES DRAWN AEROBIC AND ANAEROBIC Blood Culture adequate volume   Culture    Final    NO GROWTH 2 DAYS Performed at [facility], [place] [address] [place], [place] [address]    Report Status PENDING  Incomplete  Culture, blood (Routine X 2) w Reflex to ID Panel     Status: None (Preliminary result)   Collection Time: 04/16/17 10:21 AM  Result Value Ref Range Status   Specimen Description BLOOD RIGHT HAND  Final   Special Requests   Final    BOTTLES DRAWN AEROBIC AND ANAEROBIC Blood Culture adequate volume   Culture   Final    NO GROWTH 2 DAYS Performed at [facility], [place] [address] [place],  [address]    Report Status PENDING  Incomplete  Surgical pcr screen     Status: None   Collection Time: 04/17/17  2:22 AM  Result Value Ref Range Status   MRSA, PCR NEGATIVE NEGATIVE Final   Staphylococcus aureus NEGATIVE NEGATIVE Final    Comment: (NOTE) The Xpert SA Assay (FDA approved for NASAL specimens in patients 75 years of age and older), is one component of a comprehensive surveillance program. It is not intended to diagnose infection nor to guide or monitor treatment. Performed at [facility], [place] [address], [place], [place] [address]      Labs: BNP (last 3 results) Recent Labs    04/14/17 1533  BNP 347.4*   Basic Metabolic Panel: Recent Labs  Lab 04/14/17 1427 04/15/17 0556 04/15/17 1031 04/17/17 0559 04/17/17 1026 04/18/17 0803  NA 135 136  --  134* 132* 133*  K 4.1 4.0  --  4.5 4.4 4.5  CL 92* 99*  --  97* 95* 97*  CO2 30 26  --  21* 22 23  GLUCOSE 104* 83  --  86 103* 149*  BUN 34* 18  --  45* 44* 39*  CREATININE 6.81* 4.56*  --  8.33* 8.29* 7.30*  CALCIUM 8.8* 8.7*  --  9.0 8.9 9.1  PHOS  --   --  4.5 6.1* 6.2* 4.9*   Liver Function Tests: Recent Labs  Lab 04/17/17 0559 04/17/17 1026 04/18/17 0803  ALBUMIN 3.2* 3.1* 3.0*   No results for input(s): LIPASE, AMYLASE in the last 168 hours. No results for input(s): AMMONIA in the last 168 hours. CBC: Recent Labs  Lab 04/14/17 2359 04/15/17 0556  04/17/17 0559 04/17/17 1026 04/18/17 0847  WBC 4.4 3.9* 4.4 4.6 4.9  NEUTROABS 2.8  --  2.5  --   --   HGB 10.4* 9.4* 9.4* 9.6* 8.8*  HCT 31.4* 28.8* 29.5* 29.3* 27.3*  MCV 83.5 82.5 83.8 82.8 84.0  PLT 122* 120* 144* 112* 133*   Cardiac Enzymes: Recent Labs  Lab 04/14/17 2359 04/15/17 0556 04/15/17 1031 04/17/17 1620  TROPONINI 0.35* 0.61* 0.88* 0.33*   BNP: Invalid input(s): POCBNP CBG: No results for input(s): GLUCAP in the last 168 hours. D-Dimer No results for input(s): DDIMER in the last 72 hours. Hgb A1c No results for input(s): HGBA1C in the last 72 hours. Lipid Profile No results for input(s): CHOL, HDL, LDLCALC, TRIG, CHOLHDL, LDLDIRECT in the last 72 hours. Thyroid function studies No results for input(s): TSH, T4TOTAL, T3FREE, THYROIDAB in the last 72 hours.  Invalid input(s): FREET3 Anemia work up No results for input(s): VITAMINB12, FOLATE, FERRITIN, TIBC, IRON, RETICCTPCT in the last 72 hours. Urinalysis    Component Value Date/Time   COLORURINE YELLOW 09/03/2014 0018   APPEARANCEUR CLOUDY (A) 09/03/2014 0018   LABSPEC 1.010 09/03/2014 0018   PHURINE 5.5 09/03/2014 0018   GLUCOSEU 250 (A) 09/03/2014 0018   HGBUR TRACE (A) 09/03/2014 0018   BILIRUBINUR NEGATIVE 09/03/2014 0018   KETONESUR NEGATIVE 09/03/2014 0018   PROTEINUR 100 (A) 09/03/2014 0018   UROBILINOGEN 0.2 09/03/2014 0018   NITRITE NEGATIVE 09/03/2014 0018   LEUKOCYTESUR NEGATIVE 09/03/2014 0018   Sepsis Labs Invalid input(s): PROCALCITONIN,  WBC,  LACTICIDVEN Microbiology Recent Results (from the past 240 hour(s))  Aerobic Culture (superficial specimen)     Status: None (Preliminary result)   Collection Time: 04/14/17  6:45 PM  Result Value Ref Range Status   Specimen Description WOUND RIGHT LEG  Final   Special Requests Immunocompromised  Final   Gram Stain  Final    RARE WBC PRESENT, PREDOMINANTLY PMN NO ORGANISMS SEEN    Culture   Final    RARE MOLD ISOLATE REFERRED FOR  ID ONLY Performed at [facility], [address], [place], [place] [address]    Report Status PENDING  Incomplete  Culture, blood (routine x 2)     Status: None (Preliminary result)   Collection Time: 04/14/17  7:15 PM  Result Value Ref Range Status   Specimen Description BLOOD HEMODIALYSIS CATHETER  Final   Special Requests   Final    BOTTLES DRAWN AEROBIC AND ANAEROBIC Blood Culture results may not be optimal due to an excessive volume of blood received in culture bottles   Culture   Final    NO GROWTH 4 DAYS Performed at [facility], [place] [address], [place], [place] [address]    Report Status PENDING  Incomplete  Culture, blood (routine x 2)     Status: None (Preliminary result)   Collection Time: 04/14/17  7:20 PM  Result Value Ref Range Status   Specimen Description BLOOD HEMODIALYSIS CATHETER  Final   Special Requests   Final    BOTTLES DRAWN AEROBIC AND ANAEROBIC Blood Culture results may not be optimal due to an excessive volume of blood received in culture bottles   Culture   Final    NO GROWTH 4 DAYS Performed at [facility], [place] [address] [place], [place] [address]    Report Status PENDING  Incomplete  MRSA PCR Screening     Status: None   Collection Time: 04/15/17  1:06 AM  Result Value Ref Range Status   MRSA by PCR NEGATIVE NEGATIVE Final    Comment:        The GeneXpert MRSA Assay (FDA approved for NASAL specimens only), is one component of a comprehensive MRSA colonization surveillance program. It is not intended to diagnose MRSA infection nor to guide or monitor treatment for MRSA infections. Performed at [facility], [place] [address], [place], [place] [address]   Culture, blood (Routine X 2) w Reflex to ID Panel     Status: None (Preliminary result)   Collection Time: 04/16/17 10:14 AM  Result Value Ref Range Status   Specimen Description BLOOD RIGHT ARM  Final   Special Requests   Final    BOTTLES DRAWN AEROBIC AND  ANAEROBIC Blood Culture adequate volume   Culture   Final    NO GROWTH 2 DAYS Performed at [facility], [place] [address], [place], [place] [address]    Report Status PENDING  Incomplete  Culture, blood (Routine X 2) w Reflex to ID Panel     Status: None (Preliminary result)   Collection Time: 04/16/17 10:21 AM  Result Value Ref Range Status   Specimen Description BLOOD RIGHT HAND  Final   Special Requests   Final    BOTTLES DRAWN AEROBIC AND ANAEROBIC Blood Culture adequate volume   Culture   Final    NO GROWTH 2 DAYS Performed at [facility], [place] [address] [place], [place] [address]    Report Status PENDING  Incomplete  Surgical pcr screen     Status: None   Collection Time: 04/17/17  2:22 AM  Result Value Ref Range Status   MRSA, PCR NEGATIVE NEGATIVE Final   Staphylococcus aureus NEGATIVE NEGATIVE Final    Comment: (NOTE) The Xpert SA Assay (FDA approved for NASAL specimens in patients 89 years of age  and older), is one component of a comprehensive surveillance program. It is not intended to diagnose infection nor to guide or monitor treatment. Performed at [facility], [place] [address], [place], [place] [address]      Time coordinating discharge: 60 min- discussions with Dr Jonnie Finner, Dr Percival Spanish, Almyra Deforest, [place] and RN   SIGNED:   Debbe Odea, MD  [facility] 04/19/2017, 1:25 PM Pager   If 7PM-7AM, please contact night-coverage [email or web address] Password [email or web address]

## 2017-04-19 NOTE — Progress Notes (Signed)
ANTICOAGULATION CONSULT NOTE - Pharmacy Consult for Lovenox,   Coumadin Indication: ACS  Allergies  Allergen Reactions  . Ace Inhibitors Anaphylaxis and Rash  . Heparin Other (See Comments)    MDs told her not to take after reaction in ICU. Tolerates Lovenox fine  . Iohexol Swelling and Other (See Comments)    1970s; passed out and had facial/tongue swelling.  Requires 13-hour prep with prednisone and benadryl  . Phenytoin Other (See Comments)    Had reaction while in ICU; doesn't know.  MDs told her not to take ever again.  . Wellbutrin [Bupropion] Other (See Comments)    seizures  . Meperidine Hcl Swelling and Rash    Makes tongue swell  . Morphine Rash  . Penicillins Hives and Rash    Has patient had a PCN reaction causing immediate rash, facial/tongue/throat swelling, SOB or lightheadedness with hypotension: Yes Has patient had a PCN reaction causing severe rash involving mucus membranes or skin necrosis: No Has patient had a PCN reaction that required hospitalization: No Has patient had a PCN reaction occurring within the last 10 years: No If all of the above answers are "NO", then may proceed with Cephalosporin use.    . Valproic Acid And Related Other (See Comments)    Confusion   . Iodinated Diagnostic Agents Other (See Comments)    Pre-meditate with benadryl and prednisone 3 times before appt.  . Pentazocine Lactate Other (See Comments)    Patient does not remember reaction to this med ([place]).     Patient Measurements: Height: 5' 2.5" (158.8 cm) Weight: 179 lb (81.2 kg) IBW/kg (Calculated) : 51.25 Heparin Dosing Weight: 80 kg  Vital Signs: Temp: 97.7 F (36.5 C) (04/18 0812) Temp Source: Oral (04/18 0812) BP: 98/65 (04/18 0520) Pulse Rate: 121 (04/18 0812)  Labs: Recent Labs    04/17/17 0559 04/17/17 1026 04/17/17 1620 04/18/17 0803 04/18/17 0847 04/19/17 0958  HGB 9.4* 9.6*  --   --  8.8*  --   HCT 29.5* 29.3*  --   --  27.3*  --   PLT 144* 112*   --   --  133*  --   LABPROT  --   --   --  20.6*  --  16.2*  INR  --   --   --  1.79  --  1.32  CREATININE 8.33* 8.29*  --  7.30*  --   --   TROPONINI  --   --  0.33*  --   --   --     Estimated Creatinine Clearance: 7 mL/min (A) (by C-G formula based on SCr of 7.3 mg/dL (H)).   Medical History: Past Medical History:  Diagnosis Date  . Anemia    History  of Blood transfusion  . Asthma    PMH  . Atrial fibrillation with RVR ([place]) 11/2016  . CAD (coronary artery disease)    stent to RCA  . Cancer ([place])    clear cell cancer, kidney  . Complication of anesthesia 12/2010   pt is very confused, with AMS with anesthesia  . CVA (cerebral infarction) 2003   no apparent residual  . Dyslipidemia   . Encephalopathy   . ESRD 07/27/2008   ESRD due to HTN and NSAID's, started hemodialysis in 2005 in [place], [place]. Went to [facility] from 2010 to 2012 and since 2012 has been getting dialysis at [facility] on [address] in [place] on a MWF schedule. First access with RUA AVG placed  in [place]. Next and current access was LUA AVG placed by Dr. Lucky Cowboy in [place] in or around 2012. Has had 2 or 3 procedures on that graft since placed per family. She gets her access work done here in [place] now. She is allergic to heparin and does not get any heparin at dialysis; she had an allergic reaction apparently when in ICU in the past.      . GERD (gastroesophageal reflux disease)   . Hyperlipidemia   . Hypertension   . Hypothyroidism   . Myocardial infarction ([place])    per pt  . Positive PPD    completed rifampin  . Pulmonary hypertension ([place])   . PVD (peripheral vascular disease) ([place])   . Seizures ([place])    last seizure 6 years ago  . Sleep apnea    Sleep Study 2008  . Traumatic seroma of left lower leg ([place])   . Wears partial dentures     Assessment: Pt presented 4/13 with chest pain in the setting of aFib with RVR , and post op pain, swelling s/p right femoral  TDC on 4/9.   4/16 Infected R femoral TDC cath removed  4/16 s/p new left femoral TDC placed   PMH: A fib, HTN, HLD, CVA with previous RCA stenting, PVD, cancer, ESRD-HD MWF, MI.  Pharmacy consulted Lovenox/warfarin bridge.   after new TDC placed  On 04/17/17. This patient had been refusing lovenox due to h/o a reaction to heparin in past, unknown reaction. No HIT documented.  Post HD 04/17/17 RN was able to give patient Lovenox.  She was taking chronic coumadin PTA  for h/o Afib. Coumadin has been held for HD cath placement procedure. Coumadin resumed 4/17.  Pharmacy consulted today 4/17 to resume Coumadin for h/o Afib. Cardiology notes CAD last cath 11/2016, not a PCI candidate. Patient has been made DNR. Medical management.  INR 1.32 down ,   INR was 1.61 on 4/14 while coumadin on hold (last taken pta 4/13 10 AM. Hgb 9.2 on admit 4/9, >> 9.6>8.8,  PLTC 138 on admit > 122>120>144>112k> back up to 133 yesterday. Pltc has been 130-150s since Nov 2018. Weight = 81 kg,  ESRD-HD qMWF No bleeding reported.   PTA  Coumadin dose:  Resumed   4/10 after procedure on 4/9 at dose 11.25mg  4/10, 4/11, 4/12  Then 4/13 7.5mg  daily.   Prior to this patient was taking 7.5 mg daily with INR = 2.5 on 04/03/17.  Goal of Therapy:  4-hour Heparin Level Goal of 0.6-1 unit/mL  INR 2-3  Plan:  Continue Lovenox 80 mg sq daily  Give  coumadin  7.5 mg po today Monitor bleeding, signs and sx of clot, platelet counts on CBC Daily Protime/INR  Nicole Cella, RPh Clinical Pharmacist Pager: [phone] or [phone] (330p-1030p) Main Rx [phone] 04/19/2017,11:36 AM

## 2017-04-19 NOTE — Progress Notes (Signed)
[facility] Progress Note   Subjective:  Thinks she had blood in her stool.  RN says she saw it and it was on the tissue paper but no bloody stool. O/W no new c/o.   Objective Vitals:   04/19/17 0006 04/19/17 0520 04/19/17 0535 04/19/17 0812  BP: (!) 87/46 98/65    Pulse: 71 (!) 134 98 (!) 121  Resp: 20 19    Temp: 98 F (36.7 C) 98.2 F (36.8 C)  97.7 F (36.5 C)  TempSrc: Oral Oral  Oral  SpO2: 93% 90%  99%  Weight:  81.2 kg (179 lb)    Height:       Physical Exam General:NAD, WDWN female, sitting in bed Heart:regular rate, irregular rhythm  Lungs:CTAB Abdomen:soft, NTND Extremities:no LE edema Dialysis Access: L thigh TDC, LU AVG +b/t   Home meds: - coreg 25 bid/ dilt 120 qd/ hydral 25 tid - sl ntg prn/ isordil 20 tid - PPI/ sensipar 90/ desyrel/ T4/ norco prn/ pletal  - coumadin/ lovenox SQ - keppra - fosrenol 100mg  AC TID, 1 BID w/snacks   Dialysis Orders: MWF [facility] 75kg   2/2 bath  P4   TDC - was R thigh, now L thigh after removal / replacement - maturing LUA AVG  Hep none (allergic) -Epogen 9600 Units IV qHD - Weekly dose 47425 -Sensipar 120mg  PO qHD -Calcitriol 2.13mch PO qHD     Assessment/Plan: 1. Chest pain/+troponin/ hx CAD w RCA stent - last cath Nov, not candidate for further intervention per cardiology. Ranexa added after CP episode on HD Wed.   2. Volume - up 5- 6kg by wts, BP's soft, UF 1-2 L on HD 3. R thigh TDC drainage  - TDC removed and replaced, cx's negative. Afebrile, prob can just give 7-10 days empiric abx.   4. ESRD - HD MWF. Did well on HD yesterday, no CP.  5. A fib w/RVR - per primary. No heparin d/t allergy, on po dilt/ coreg per cards 6. CM - LVEF 30%  7. Anemia of CKD- Hgb 9.6 s/p 2Unit pRBC, started epogen 9600 Unit qHD 8. MBD ckd  - Ca in goal. P 6.2. Cont Senispar, calcitriol.  Not tolerating Fosrenol, changed to Renvela 2AC TID, 1 w/snacks BID. 9. HTN - BP's on lower side partly due to afib meds (dilt/  coreg) 10. Nutrition - Renal diet with fluid restrictions.  11.  Seizure do - on keppra 12.  S/p new LUA AVG - placed on 4/4 by Dr. Bridgett Larsson, will be ready to use in about 2wks 13.  DNR    Kelly Splinter MD [facility] pager [phone]   04/19/2017, 11:37 AM  Filed Weights   04/18/17 0700 04/18/17 1104 04/19/17 0520  Weight: 81.3 kg (179 lb 3.7 oz) 80 kg (176 lb 5.9 oz) 81.2 kg (179 lb)    Intake/Output Summary (Last 24 hours) at 04/19/2017 1137 Last data filed at 04/19/2017 0900 Gross per 24 hour  Intake 1490 ml  Output -  Net 1490 ml    Additional Objective Labs: Basic Metabolic Panel: Recent Labs  Lab 04/17/17 0559 04/17/17 1026 04/18/17 0803  NA 134* 132* 133*  K 4.5 4.4 4.5  CL 97* 95* 97*  CO2 21* 22 23  GLUCOSE 86 103* 149*  BUN 45* 44* 39*  CREATININE 8.33* 8.29* 7.30*  CALCIUM 9.0 8.9 9.1  PHOS 6.1* 6.2* 4.9*   Liver Function Tests: Recent Labs  Lab 04/17/17 0559 04/17/17 1026 04/18/17 0803  ALBUMIN 3.2* 3.1* 3.0*   CBC: Recent Labs  Lab 04/14/17 2359 04/15/17 0556 04/17/17 0559 04/17/17 1026 04/18/17 0847  WBC 4.4 3.9* 4.4 4.6 4.9  NEUTROABS 2.8  --  2.5  --   --   HGB 10.4* 9.4* 9.4* 9.6* 8.8*  HCT 31.4* 28.8* 29.5* 29.3* 27.3*  MCV 83.5 82.5 83.8 82.8 84.0  PLT 122* 120* 144* 112* 133*   Blood Culture    Component Value Date/Time   SDES BLOOD RIGHT HAND 04/16/2017 1021   SPECREQUEST  04/16/2017 1021    BOTTLES DRAWN AEROBIC AND ANAEROBIC Blood Culture adequate volume   CULT  04/16/2017 1021    NO GROWTH 2 DAYS Performed at [facility], [place] [address] [place], [place] [address]    REPTSTATUS PENDING 04/16/2017 1021    Cardiac Enzymes: Recent Labs  Lab 04/14/17 2359 04/15/17 0556 04/15/17 1031 04/17/17 1620  TROPONINI 0.35* 0.61* 0.88* 0.33*   Iron Studies:  No results for input(s): IRON, TIBC, TRANSFERRIN, FERRITIN in the last 72 hours. Studies/Results: No results found.  Medications: .  cefTAZidime (FORTAZ)  IV Stopped (04/18/17 1020)  . vancomycin Stopped (04/18/17 1139)   . ambrisentan  5 mg Oral Daily  . aspirin EC  81 mg Oral Daily  . cilostazol  100 mg Oral BID AC  . digoxin  0.125 mg Oral Once per day on Mon Wed Fri  . diltiazem  60 mg Oral Q6H  . diphenhydrAMINE  50 mg Oral Q M,W,F  . enoxaparin (LOVENOX) injection  80 mg Subcutaneous Daily  . epoetin (EPOGEN/PROCRIT) injection  9,600 Units Intravenous Q M,W,F-HD  . isosorbide dinitrate  10 mg Oral TID  . levETIRAcetam  500 mg Oral Q2000  . levothyroxine  150 mcg Oral QAC breakfast  . metoprolol tartrate  50 mg Oral BID  . multivitamin  1 tablet Oral QHS  . mupirocin ointment   Nasal BID  . pantoprazole  40 mg Oral Daily  . pravastatin  40 mg Oral QHS  . ranolazine  500 mg Oral BID  . sevelamer carbonate  1,600 mg Oral TID WC  . sevelamer carbonate  800 mg Oral BID BM  . Warfarin - Pharmacist Dosing Inpatient   Does not apply [phone]

## 2017-04-19 NOTE — Progress Notes (Signed)
Spoke with Dr. Elson Areas on call with Cardiology due to patient's low blood pressures this shift.  Most recent blood pressure 87/46 (HR 70-90s) remains in atrial fibrillation.  Patient asymptomatic.  Per Dr. Elson Areas do not give Metoprolol and Cardizem doses.

## 2017-04-19 NOTE — Progress Notes (Addendum)
Progress Note  Patient Name: [name] Date of Encounter: 04/19/2017  Primary Cardiologist:   Minus Breeding, MD   Subjective   BP low last evening and meds held.  She reported blood on her toilet paper    She is not having any chest pain or SOB.   Inpatient Medications    Scheduled Meds: . ambrisentan  5 mg Oral Daily  . aspirin EC  81 mg Oral Daily  . cilostazol  100 mg Oral BID AC  . diltiazem  60 mg Oral Q6H  . diphenhydrAMINE  50 mg Oral Q M,W,F  . enoxaparin (LOVENOX) injection  80 mg Subcutaneous Daily  . epoetin (EPOGEN/PROCRIT) injection  9,600 Units Intravenous Q M,W,F-HD  . isosorbide dinitrate  10 mg Oral TID  . levETIRAcetam  500 mg Oral Q2000  . levothyroxine  150 mcg Oral QAC breakfast  . metoprolol tartrate  25 mg Oral QID  . multivitamin  1 tablet Oral QHS  . mupirocin ointment   Nasal BID  . pantoprazole  40 mg Oral Daily  . pravastatin  40 mg Oral QHS  . ranolazine  500 mg Oral BID  . sevelamer carbonate  1,600 mg Oral TID WC  . sevelamer carbonate  800 mg Oral BID BM  . Warfarin - Pharmacist Dosing Inpatient   Does not apply q1800   Continuous Infusions: . cefTAZidime (FORTAZ)  IV Stopped (04/18/17 1020)  . vancomycin Stopped (04/18/17 1139)   PRN Meds: acetaminophen, albuterol, calcitRIOL, cinacalcet, HYDROcodone-acetaminophen, morphine injection, nitroGLYCERIN, ondansetron (ZOFRAN) IV, senna-docusate, traZODone   Vital Signs    Vitals:   04/19/17 0006 04/19/17 0520 04/19/17 0535 04/19/17 0812  BP: (!) 87/46 98/65    Pulse: 71 (!) 134 98 (!) 121  Resp: 20 19    Temp: 98 F (36.7 C) 98.2 F (36.8 C)  97.7 F (36.5 C)  TempSrc: Oral Oral  Oral  SpO2: 93% 90%  99%  Weight:  179 lb (81.2 kg)    Height:        Intake/Output Summary (Last 24 hours) at 04/19/2017 1003 Last data filed at 04/19/2017 0900 Gross per 24 hour  Intake 1730 ml  Output 1500 ml  Net 230 ml   Filed Weights   04/18/17 0700 04/18/17 1104 04/19/17 0520    Weight: 179 lb 3.7 oz (81.3 kg) 176 lb 5.9 oz (80 kg) 179 lb (81.2 kg)    Telemetry    Atrial fib with rapid rate.  - Personally Reviewed  ECG    NA - Personally Reviewed  Physical Exam   GEN: No acute distress.   Neck: No  JVD Cardiac: Irregular RR, 2/6 apical systolic murmur, no diastolic murmurs, rubs, or gallops.  Respiratory: Clear  to auscultation bilaterally. GI: Soft, nontender, non-distended  MS: No  edema; No deformity. Neuro:  Nonfocal  Psych: Normal affect   Labs    Chemistry Recent Labs  Lab 04/17/17 0559 04/17/17 1026 04/18/17 0803  NA 134* 132* 133*  K 4.5 4.4 4.5  CL 97* 95* 97*  CO2 21* 22 23  GLUCOSE 86 103* 149*  BUN 45* 44* 39*  CREATININE 8.33* 8.29* 7.30*  CALCIUM 9.0 8.9 9.1  ALBUMIN 3.2* 3.1* 3.0*  GFRNONAA 4* 4* 5*  GFRAA 5* 5* 6*  ANIONGAP 16* 15 13     Hematology Recent Labs  Lab 04/17/17 0559 04/17/17 1026 04/18/17 0847  WBC 4.4 4.6 4.9  RBC 3.52* 3.54* 3.25*  HGB 9.4* 9.6* 8.8*  HCT 29.5* 29.3* 27.3*  MCV 83.8 82.8 84.0  MCH 26.7 27.1 27.1  MCHC 31.9 32.8 32.2  RDW 18.8* 18.5* 18.8*  PLT 144* 112* 133*    Cardiac Enzymes Recent Labs  Lab 04/14/17 2359 04/15/17 0556 04/15/17 1031 04/17/17 1620  TROPONINI 0.35* 0.61* 0.88* 0.33*    Recent Labs  Lab 04/14/17 1433  TROPIPOC 0.26*     BNP Recent Labs  Lab 04/14/17 1533  BNP 600.3*     DDimer No results for input(s): DDIMER in the last 168 hours.   Radiology    No results found.  Cardiac Studies   NA  Patient Profile     72 y.o. female w/ ESRD on HD pulmonary HTN, essential HTN, HLD, CAD w/ previous RCA stenting, OSA and atrial fibrillation. She underwent recent vascular surgery for new HD access in left upper am, and presented back with post op pain and swelling. Pt also with new chest pain in the setting of atrial fibrillation w/ RVR, for which cardiology was consulted.   Assessment & Plan    ATRIAL FIB with RVR:    On warfarin per pharmacy.    I will give digoxin 0.125 mg 3 x per week.  Start today.   CAD:  Medical management.  On Renexa.   No further ischemia   HYPOTENSION:   This precludes med titration.   For now I am going to try to consolidate the beta blocker dose first and then see if we can modify the Cardizem dose later.  She will likely need to develop a pattern at home in which she checks her BP frequently and she hold her meds if her BP is below systolic 90.    ESRD:  Per nephrology.    BRBPR:  Follow per primary team.  Continue anticoagulation for now until this is confirmed and quantified.    DISPOSITION:  I would suggest nursing or palliative services at discharge to help [place] understand how to handle her meds almost on a daily basis.   For questions or updates, please contact [place] Please consult [email or web address] for contact info under Cardiology/STEMI.   Signed, Minus Breeding, MD  04/19/2017, 10:03 AM

## 2017-04-20 ENCOUNTER — Inpatient Hospital Stay (HOSPITAL_COMMUNITY)
Admission: EM | Admit: 2017-04-20 | Discharge: 2017-04-26 | DRG: 308 | Disposition: A | Discharge status: Home Health | Payer: Medicare Other | Attending: Internal Medicine | Admitting: Internal Medicine

## 2017-04-20 ENCOUNTER — Emergency Department (HOSPITAL_COMMUNITY): Payer: Medicare Other

## 2017-04-20 ENCOUNTER — Other Ambulatory Visit: Payer: Self-pay

## 2017-04-20 DIAGNOSIS — R404 Transient alteration of awareness: Secondary | ICD-10-CM | POA: Diagnosis not present

## 2017-04-20 DIAGNOSIS — E785 Hyperlipidemia, unspecified: Secondary | ICD-10-CM | POA: Diagnosis present

## 2017-04-20 DIAGNOSIS — I272 Pulmonary hypertension, unspecified: Secondary | ICD-10-CM | POA: Diagnosis present

## 2017-04-20 DIAGNOSIS — I160 Hypertensive urgency: Secondary | ICD-10-CM | POA: Diagnosis not present

## 2017-04-20 DIAGNOSIS — I132 Hypertensive heart and chronic kidney disease with heart failure and with stage 5 chronic kidney disease, or end stage renal disease: Secondary | ICD-10-CM | POA: Diagnosis present

## 2017-04-20 DIAGNOSIS — I5032 Chronic diastolic (congestive) heart failure: Secondary | ICD-10-CM | POA: Diagnosis not present

## 2017-04-20 DIAGNOSIS — I481 Persistent atrial fibrillation: Principal | ICD-10-CM

## 2017-04-20 DIAGNOSIS — I252 Old myocardial infarction: Secondary | ICD-10-CM

## 2017-04-20 DIAGNOSIS — Z955 Presence of coronary angioplasty implant and graft: Secondary | ICD-10-CM

## 2017-04-20 DIAGNOSIS — I4891 Unspecified atrial fibrillation: Secondary | ICD-10-CM | POA: Diagnosis not present

## 2017-04-20 DIAGNOSIS — K219 Gastro-esophageal reflux disease without esophagitis: Secondary | ICD-10-CM | POA: Diagnosis present

## 2017-04-20 DIAGNOSIS — D631 Anemia in chronic kidney disease: Secondary | ICD-10-CM | POA: Diagnosis present

## 2017-04-20 DIAGNOSIS — Z79899 Other long term (current) drug therapy: Secondary | ICD-10-CM

## 2017-04-20 DIAGNOSIS — G4733 Obstructive sleep apnea (adult) (pediatric): Secondary | ICD-10-CM | POA: Diagnosis present

## 2017-04-20 DIAGNOSIS — N186 End stage renal disease: Secondary | ICD-10-CM | POA: Diagnosis not present

## 2017-04-20 DIAGNOSIS — R531 Weakness: Secondary | ICD-10-CM | POA: Diagnosis not present

## 2017-04-20 DIAGNOSIS — I739 Peripheral vascular disease, unspecified: Secondary | ICD-10-CM | POA: Diagnosis present

## 2017-04-20 DIAGNOSIS — Z9114 Patient's other noncompliance with medication regimen: Secondary | ICD-10-CM

## 2017-04-20 DIAGNOSIS — R778 Other specified abnormalities of plasma proteins: Secondary | ICD-10-CM | POA: Diagnosis present

## 2017-04-20 DIAGNOSIS — N2581 Secondary hyperparathyroidism of renal origin: Secondary | ICD-10-CM | POA: Diagnosis not present

## 2017-04-20 DIAGNOSIS — F32A Depression, unspecified: Secondary | ICD-10-CM | POA: Diagnosis present

## 2017-04-20 DIAGNOSIS — J45909 Unspecified asthma, uncomplicated: Secondary | ICD-10-CM | POA: Diagnosis present

## 2017-04-20 DIAGNOSIS — Z7901 Long term (current) use of anticoagulants: Secondary | ICD-10-CM | POA: Diagnosis not present

## 2017-04-20 DIAGNOSIS — I10 Essential (primary) hypertension: Secondary | ICD-10-CM | POA: Diagnosis not present

## 2017-04-20 DIAGNOSIS — Z66 Do not resuscitate: Secondary | ICD-10-CM | POA: Diagnosis present

## 2017-04-20 DIAGNOSIS — R0602 Shortness of breath: Secondary | ICD-10-CM | POA: Diagnosis not present

## 2017-04-20 DIAGNOSIS — I120 Hypertensive chronic kidney disease with stage 5 chronic kidney disease or end stage renal disease: Secondary | ICD-10-CM | POA: Diagnosis not present

## 2017-04-20 DIAGNOSIS — Z8673 Personal history of transient ischemic attack (TIA), and cerebral infarction without residual deficits: Secondary | ICD-10-CM

## 2017-04-20 DIAGNOSIS — N19 Unspecified kidney failure: Secondary | ICD-10-CM | POA: Diagnosis not present

## 2017-04-20 DIAGNOSIS — I081 Rheumatic disorders of both mitral and tricuspid valves: Secondary | ICD-10-CM | POA: Diagnosis present

## 2017-04-20 DIAGNOSIS — R7989 Other specified abnormal findings of blood chemistry: Secondary | ICD-10-CM | POA: Diagnosis present

## 2017-04-20 DIAGNOSIS — Z992 Dependence on renal dialysis: Secondary | ICD-10-CM

## 2017-04-20 DIAGNOSIS — Z7982 Long term (current) use of aspirin: Secondary | ICD-10-CM

## 2017-04-20 DIAGNOSIS — Z87891 Personal history of nicotine dependence: Secondary | ICD-10-CM | POA: Diagnosis not present

## 2017-04-20 DIAGNOSIS — R079 Chest pain, unspecified: Secondary | ICD-10-CM | POA: Diagnosis not present

## 2017-04-20 DIAGNOSIS — I25119 Atherosclerotic heart disease of native coronary artery with unspecified angina pectoris: Secondary | ICD-10-CM | POA: Diagnosis not present

## 2017-04-20 DIAGNOSIS — Z888 Allergy status to other drugs, medicaments and biological substances status: Secondary | ICD-10-CM

## 2017-04-20 DIAGNOSIS — J811 Chronic pulmonary edema: Secondary | ICD-10-CM | POA: Diagnosis not present

## 2017-04-20 DIAGNOSIS — E039 Hypothyroidism, unspecified: Secondary | ICD-10-CM | POA: Diagnosis present

## 2017-04-20 DIAGNOSIS — K5900 Constipation, unspecified: Secondary | ICD-10-CM | POA: Diagnosis present

## 2017-04-20 DIAGNOSIS — I2510 Atherosclerotic heart disease of native coronary artery without angina pectoris: Secondary | ICD-10-CM | POA: Diagnosis present

## 2017-04-20 DIAGNOSIS — R748 Abnormal levels of other serum enzymes: Secondary | ICD-10-CM | POA: Diagnosis not present

## 2017-04-20 DIAGNOSIS — G40909 Epilepsy, unspecified, not intractable, without status epilepticus: Secondary | ICD-10-CM | POA: Diagnosis not present

## 2017-04-20 DIAGNOSIS — F329 Major depressive disorder, single episode, unspecified: Secondary | ICD-10-CM | POA: Diagnosis present

## 2017-04-20 DIAGNOSIS — E8770 Fluid overload, unspecified: Secondary | ICD-10-CM | POA: Diagnosis not present

## 2017-04-20 LAB — CBC WITH DIFFERENTIAL/PLATELET
BASOS ABS: 0 10*3/uL (ref 0.0–0.1)
BASOS PCT: 0 %
Eosinophils Absolute: 0.2 10*3/uL (ref 0.0–0.7)
Eosinophils Relative: 3 %
HEMATOCRIT: 28.9 % — AB (ref 36.0–46.0)
HEMOGLOBIN: 9.4 g/dL — AB (ref 12.0–15.0)
LYMPHS PCT: 22 %
Lymphs Abs: 1.3 10*3/uL (ref 0.7–4.0)
MCH: 27.7 pg (ref 26.0–34.0)
MCHC: 32.5 g/dL (ref 30.0–36.0)
MCV: 85.3 fL (ref 78.0–100.0)
MONO ABS: 0.5 10*3/uL (ref 0.1–1.0)
Monocytes Relative: 9 %
NEUTROS ABS: 4 10*3/uL (ref 1.7–7.7)
NEUTROS PCT: 66 %
Platelets: 129 10*3/uL — ABNORMAL LOW (ref 150–400)
RBC: 3.39 MIL/uL — AB (ref 3.87–5.11)
RDW: 19.7 % — AB (ref 11.5–15.5)
WBC: 6.1 10*3/uL (ref 4.0–10.5)

## 2017-04-20 LAB — COMPREHENSIVE METABOLIC PANEL
ALBUMIN: 3.2 g/dL — AB (ref 3.5–5.0)
ALT: 17 U/L (ref 14–54)
AST: 17 U/L (ref 15–41)
Alkaline Phosphatase: 58 U/L (ref 38–126)
Anion gap: 16 — ABNORMAL HIGH (ref 5–15)
BILIRUBIN TOTAL: 0.7 mg/dL (ref 0.3–1.2)
BUN: 50 mg/dL — AB (ref 6–20)
CHLORIDE: 98 mmol/L — AB (ref 101–111)
CO2: 23 mmol/L (ref 22–32)
CREATININE: 7.11 mg/dL — AB (ref 0.44–1.00)
Calcium: 9.8 mg/dL (ref 8.9–10.3)
GFR calc Af Amer: 6 mL/min — ABNORMAL LOW (ref >60)
GFR calc non Af Amer: 5 mL/min — ABNORMAL LOW (ref >60)
GLUCOSE: 98 mg/dL (ref 65–99)
Potassium: 4.4 mmol/L (ref 3.5–5.1)
Sodium: 137 mmol/L (ref 135–145)
TOTAL PROTEIN: 6.4 g/dL — AB (ref 6.5–8.1)

## 2017-04-20 LAB — PROTIME-INR
INR: 1.26
Prothrombin Time: 15.7 seconds — ABNORMAL HIGH (ref 11.4–15.2)

## 2017-04-20 LAB — TROPONIN I
TROPONIN I: 0.27 ng/mL — AB (ref <0.03)
TROPONIN I: 0.31 ng/mL — AB (ref <0.03)
Troponin I: 0.26 ng/mL (ref <0.03)

## 2017-04-20 LAB — DIGOXIN LEVEL: Digoxin Level: <0.2 ng/mL — ABNORMAL LOW (ref 0.8–2.0)

## 2017-04-20 LAB — MRSA PCR SCREENING: MRSA BY PCR: NEGATIVE

## 2017-04-20 MED ORDER — MIDODRINE HCL 5 MG PO TABS
ORAL_TABLET | ORAL | Status: AC
  Administered 2017-04-20: 10 mg via ORAL
  Filled 2017-04-20: qty 2

## 2017-04-20 MED ORDER — ONDANSETRON HCL 4 MG/2ML IJ SOLN: 4.0000 mg | Freq: Four times a day (QID) | INTRAMUSCULAR | Status: DC | PRN

## 2017-04-20 MED ORDER — HYDROCODONE-ACETAMINOPHEN 5-325 MG PO TABS
1.0000 | ORAL_TABLET | Freq: Four times a day (QID) | ORAL | Status: DC | PRN
  Administered 2017-04-21: 1 via ORAL
  Filled 2017-04-20: qty 1

## 2017-04-20 MED ORDER — LIDOCAINE HCL (PF) 1 % IJ SOLN: 5.0000 mL | INTRAMUSCULAR | Status: DC | PRN

## 2017-04-20 MED ORDER — ASPIRIN EC 81 MG PO TBEC
81.0000 mg | DELAYED_RELEASE_TABLET | Freq: Every day | ORAL | Status: DC
  Administered 2017-04-20 – 2017-04-26 (×7): 81 mg via ORAL
  Filled 2017-04-20 (×7): qty 1

## 2017-04-20 MED ORDER — ACETAMINOPHEN 500 MG PO TABS: 1000.0000 mg | ORAL_TABLET | Freq: Every day | ORAL | Status: DC | PRN

## 2017-04-20 MED ORDER — AMBRISENTAN 5 MG PO TABS
5.0000 mg | ORAL_TABLET | Freq: Every day | ORAL | Status: DC
  Administered 2017-04-20 – 2017-04-26 (×7): 5 mg via ORAL
  Filled 2017-04-20 (×8): qty 1

## 2017-04-20 MED ORDER — SENNOSIDES-DOCUSATE SODIUM 8.6-50 MG PO TABS
2.0000 | ORAL_TABLET | Freq: Every evening | ORAL | Status: DC | PRN
  Administered 2017-04-20 – 2017-04-22 (×3): 2 via ORAL
  Filled 2017-04-20 (×4): qty 2

## 2017-04-20 MED ORDER — SODIUM CHLORIDE 0.9 % IV SOLN: 100.0000 mL | INTRAVENOUS | Status: DC | PRN

## 2017-04-20 MED ORDER — VANCOMYCIN HCL IN DEXTROSE 750-5 MG/150ML-% IV SOLN: 750.0000 mg | INTRAVENOUS | Status: DC

## 2017-04-20 MED ORDER — RENA-VITE PO TABS: 1.0000 | ORAL_TABLET | Freq: Every day | ORAL | Status: DC

## 2017-04-20 MED ORDER — VANCOMYCIN HCL IN DEXTROSE 750-5 MG/150ML-% IV SOLN
750.0000 mg | INTRAVENOUS | Status: AC
  Administered 2017-04-20: 750 mg via INTRAVENOUS
  Filled 2017-04-20: qty 150

## 2017-04-20 MED ORDER — RANOLAZINE ER 500 MG PO TB12
500.0000 mg | ORAL_TABLET | Freq: Two times a day (BID) | ORAL | Status: DC
  Administered 2017-04-21 – 2017-04-26 (×12): 500 mg via ORAL
  Filled 2017-04-20 (×12): qty 1

## 2017-04-20 MED ORDER — CINACALCET HCL 30 MG PO TABS
90.0000 mg | ORAL_TABLET | ORAL | Status: DC
  Administered 2017-04-23 – 2017-04-25 (×2): 90 mg via ORAL
  Filled 2017-04-20: qty 3

## 2017-04-20 MED ORDER — METOPROLOL SUCCINATE ER 100 MG PO TB24
100.0000 mg | ORAL_TABLET | Freq: Every day | ORAL | Status: DC
  Administered 2017-04-21 – 2017-04-23 (×3): 100 mg via ORAL
  Filled 2017-04-20 (×3): qty 1

## 2017-04-20 MED ORDER — PANTOPRAZOLE SODIUM 40 MG PO TBEC
40.0000 mg | DELAYED_RELEASE_TABLET | Freq: Every day | ORAL | Status: DC
  Administered 2017-04-21 – 2017-04-25 (×6): 40 mg via ORAL
  Filled 2017-04-20 (×6): qty 1

## 2017-04-20 MED ORDER — DIGOXIN 125 MCG PO TABS
0.1250 mg | ORAL_TABLET | ORAL | Status: DC
  Administered 2017-04-20 – 2017-04-25 (×3): 0.125 mg via ORAL
  Filled 2017-04-20 (×3): qty 1

## 2017-04-20 MED ORDER — PENTAFLUOROPROP-TETRAFLUOROETH EX AERO
1.0000 "application " | INHALATION_SPRAY | CUTANEOUS | Status: DC | PRN
  Filled 2017-04-20: qty 30

## 2017-04-20 MED ORDER — HYDROCORTISONE ACETATE 25 MG RE SUPP: 25.0000 mg | Freq: Two times a day (BID) | RECTAL | Status: DC

## 2017-04-20 MED ORDER — WARFARIN SODIUM 7.5 MG PO TABS
7.5000 mg | ORAL_TABLET | Freq: Once | ORAL | Status: AC
  Administered 2017-04-20: 7.5 mg via ORAL
  Filled 2017-04-20 (×2): qty 1

## 2017-04-20 MED ORDER — DILTIAZEM HCL-DEXTROSE 100-5 MG/100ML-% IV SOLN (PREMIX): 5.0000 mg/h | INTRAVENOUS | Status: DC

## 2017-04-20 MED ORDER — DILTIAZEM HCL-DEXTROSE 100-5 MG/100ML-% IV SOLN (PREMIX)
5.0000 mg/h | INTRAVENOUS | Status: DC
  Administered 2017-04-20: 5 mg/h via INTRAVENOUS
  Filled 2017-04-20: qty 100

## 2017-04-20 MED ORDER — DIPHENHYDRAMINE HCL 25 MG PO CAPS
ORAL_CAPSULE | ORAL | Status: AC
  Administered 2017-04-20: 50 mg via ORAL
  Filled 2017-04-20: qty 2

## 2017-04-20 MED ORDER — DILTIAZEM LOAD VIA INFUSION
10.0000 mg | Freq: Once | INTRAVENOUS | Status: AC
  Administered 2017-04-20: 5 mg via INTRAVENOUS
  Filled 2017-04-20: qty 10

## 2017-04-20 MED ORDER — DIPHENHYDRAMINE HCL 25 MG PO CAPS
50.0000 mg | ORAL_CAPSULE | ORAL | Status: DC
  Administered 2017-04-20 – 2017-04-26 (×5): 50 mg via ORAL
  Filled 2017-04-20 (×4): qty 2

## 2017-04-20 MED ORDER — ALTEPLASE 2 MG IJ SOLR: 2.0000 mg | Freq: Once | INTRAMUSCULAR | Status: DC | PRN

## 2017-04-20 MED ORDER — RENA-VITE PO TABS
1.0000 | ORAL_TABLET | Freq: Every day | ORAL | Status: DC
  Administered 2017-04-21 – 2017-04-25 (×5): 1 via ORAL
  Filled 2017-04-20 (×5): qty 1

## 2017-04-20 MED ORDER — ACETAMINOPHEN 325 MG PO TABS: 650.0000 mg | ORAL_TABLET | ORAL | Status: DC | PRN

## 2017-04-20 MED ORDER — SODIUM CHLORIDE 0.9% FLUSH
3.0000 mL | Freq: Two times a day (BID) | INTRAVENOUS | Status: DC
  Administered 2017-04-21 – 2017-04-26 (×11): 3 mL via INTRAVENOUS

## 2017-04-20 MED ORDER — VANCOMYCIN HCL IN DEXTROSE 750-5 MG/150ML-% IV SOLN
INTRAVENOUS | Status: AC
  Administered 2017-04-20: 750 mg via INTRAVENOUS
  Filled 2017-04-20: qty 150

## 2017-04-20 MED ORDER — TRAZODONE HCL 50 MG PO TABS
50.0000 mg | ORAL_TABLET | Freq: Every evening | ORAL | Status: DC | PRN
  Administered 2017-04-22 – 2017-04-25 (×2): 50 mg via ORAL
  Filled 2017-04-20 (×2): qty 1

## 2017-04-20 MED ORDER — ISOSORBIDE DINITRATE 20 MG PO TABS
20.0000 mg | ORAL_TABLET | Freq: Three times a day (TID) | ORAL | Status: DC
  Administered 2017-04-21 – 2017-04-22 (×5): 20 mg via ORAL
  Filled 2017-04-20 (×2): qty 1
  Filled 2017-04-20 (×2): qty 2
  Filled 2017-04-20: qty 1
  Filled 2017-04-20: qty 2
  Filled 2017-04-20: qty 1
  Filled 2017-04-20: qty 2
  Filled 2017-04-20: qty 1
  Filled 2017-04-20: qty 2

## 2017-04-20 MED ORDER — DILTIAZEM HCL 60 MG PO TABS
60.0000 mg | ORAL_TABLET | Freq: Three times a day (TID) | ORAL | Status: DC
  Administered 2017-04-20 – 2017-04-26 (×18): 60 mg via ORAL
  Filled 2017-04-20 (×19): qty 1

## 2017-04-20 MED ORDER — DILTIAZEM HCL 60 MG PO TABS: 60.0000 mg | ORAL_TABLET | Freq: Three times a day (TID) | ORAL | Status: DC

## 2017-04-20 MED ORDER — NITROGLYCERIN 0.4 MG SL SUBL
0.4000 mg | SUBLINGUAL_TABLET | SUBLINGUAL | Status: DC | PRN
  Administered 2017-04-21 – 2017-04-25 (×4): 0.4 mg via SUBLINGUAL
  Filled 2017-04-20 (×2): qty 1

## 2017-04-20 MED ORDER — SEVELAMER CARBONATE 800 MG PO TABS
1600.0000 mg | ORAL_TABLET | Freq: Three times a day (TID) | ORAL | Status: DC
  Administered 2017-04-20 – 2017-04-26 (×16): 1600 mg via ORAL
  Filled 2017-04-20 (×16): qty 2

## 2017-04-20 MED ORDER — MIDODRINE HCL 5 MG PO TABS
10.0000 mg | ORAL_TABLET | Freq: Once | ORAL | Status: AC
  Administered 2017-04-20: 10 mg via ORAL

## 2017-04-20 MED ORDER — CILOSTAZOL 100 MG PO TABS
100.0000 mg | ORAL_TABLET | Freq: Two times a day (BID) | ORAL | Status: DC
  Administered 2017-04-20 – 2017-04-26 (×12): 100 mg via ORAL
  Filled 2017-04-20 (×13): qty 1

## 2017-04-20 MED ORDER — ALBUTEROL SULFATE (2.5 MG/3ML) 0.083% IN NEBU: 2.5000 mg | INHALATION_SOLUTION | Freq: Four times a day (QID) | RESPIRATORY_TRACT | Status: DC | PRN

## 2017-04-20 MED ORDER — LIDOCAINE-PRILOCAINE 2.5-2.5 % EX CREA
1.0000 "application " | TOPICAL_CREAM | CUTANEOUS | Status: DC | PRN
  Filled 2017-04-20: qty 5

## 2017-04-20 MED ORDER — LEVOTHYROXINE SODIUM 150 MCG PO TABS
150.0000 ug | ORAL_TABLET | Freq: Every day | ORAL | Status: DC
  Administered 2017-04-21 – 2017-04-26 (×6): 150 ug via ORAL
  Filled 2017-04-20 (×4): qty 1
  Filled 2017-04-20: qty 2
  Filled 2017-04-20 (×2): qty 1
  Filled 2017-04-20: qty 2

## 2017-04-20 MED ORDER — PRAVASTATIN SODIUM 40 MG PO TABS
40.0000 mg | ORAL_TABLET | Freq: Every day | ORAL | Status: DC
  Administered 2017-04-21 – 2017-04-25 (×6): 40 mg via ORAL
  Filled 2017-04-20 (×6): qty 1

## 2017-04-20 MED ORDER — SODIUM CHLORIDE 0.9% FLUSH: 3.0000 mL | INTRAVENOUS | Status: DC | PRN

## 2017-04-20 MED ORDER — OMEPRAZOLE MAGNESIUM 20 MG PO TBEC: 20.0000 mg | DELAYED_RELEASE_TABLET | Freq: Every day | ORAL | Status: DC

## 2017-04-20 MED ORDER — WARFARIN - PHARMACIST DOSING INPATIENT
Freq: Every day | Status: DC
  Administered 2017-04-20 – 2017-04-26 (×3)

## 2017-04-20 MED ORDER — SODIUM CHLORIDE 0.9 % IV SOLN: 250.0000 mL | INTRAVENOUS | Status: DC | PRN

## 2017-04-20 MED ORDER — LEVETIRACETAM 500 MG PO TABS
500.0000 mg | ORAL_TABLET | Freq: Every day | ORAL | Status: DC
  Administered 2017-04-21 – 2017-04-25 (×6): 500 mg via ORAL
  Filled 2017-04-20 (×6): qty 1

## 2017-04-20 MED ORDER — DIALYVITE 3000 3 MG PO TABS: 1.0000 | ORAL_TABLET | Freq: Every day | ORAL | Status: DC

## 2017-04-20 NOTE — ED Notes (Signed)
NP in room with patient at this time.

## 2017-04-20 NOTE — ED Provider Notes (Signed)
[facility] EMERGENCY DEPARTMENT Provider Note   CSN: [id number] Arrival date & time: 04/20/17  0720     History   Chief Complaint Chief Complaint  Patient presents with  . Atrial Fibrillation    HPI [name] is a 72 y.o. female.  The history is provided by the patient. No language interpreter was used.  Atrial Fibrillation  This is a new problem. The current episode started 6 to 12 hours ago. The problem occurs constantly. The problem has been gradually worsening. Associated symptoms include chest pain. Nothing aggravates the symptoms. Nothing relieves the symptoms. She has tried nothing for the symptoms. The treatment provided no relief.  Pt reports she was discharged yesterday.  Pt reports her heart began racing again today.  Pt reports her normal heart rate is 90.  Pt reports while she was in the hospital her heart rate stayed elevated.    Past Medical History:  Diagnosis Date  . Anemia    History  of Blood transfusion  . Asthma    PMH  . Atrial fibrillation with RVR ([place]) 11/2016  . CAD (coronary artery disease)    stent to RCA  . Cancer ([place])    clear cell cancer, kidney  . Complication of anesthesia 12/2010   pt is very confused, with AMS with anesthesia  . CVA (cerebral infarction) 2003   no apparent residual  . Dyslipidemia   . Encephalopathy   . ESRD 07/27/2008   ESRD due to HTN and NSAID's, started hemodialysis in 2005 in [place], [place]. Went to [facility] from 2010 to 2012 and since 2012 has been getting dialysis at [facility] on [address] in [place] on a MWF schedule. First access with RUA AVG placed in [place]. Next and current access was LUA AVG placed by Dr. Lucky Cowboy in [place] in or around 2012. Has had 2 or 3 procedures on that graft since placed per family. She gets her access work done here in [place] now. She is allergic to heparin and does not get any heparin at dialysis; she had an allergic reaction apparently  when in ICU in the past.      . GERD (gastroesophageal reflux disease)   . Hyperlipidemia   . Hypertension   . Hypothyroidism   . Myocardial infarction ([place])    per pt  . Positive PPD    completed rifampin  . Pulmonary hypertension ([place])   . PVD (peripheral vascular disease) ([place])   . Seizures ([place])    last seizure 6 years ago  . Sleep apnea    Sleep Study 2008  . Traumatic seroma of left lower leg ([place])   . Wears partial dentures     Patient Active Problem List   Diagnosis Date Noted  . Infection of hemodialysis tunneled catheter ([place]) 04/19/2017  . Chest pain at rest 04/19/2017  . Atrial fibrillation ([place]) [I48.91] 12/08/2016  . Long term (current) use of anticoagulants [Z79.01] 12/08/2016  . Atrial fibrillation with RVR ([place]) 11/20/2016  . Precordial pain 10/26/2016  . Hematoma 09/12/2016  . Dyslipidemia 05/25/2016  . Coronary artery disease involving native coronary artery of native heart without angina pectoris 05/25/2016  . Atherosclerosis of native arteries of extremity with intermittent claudication ([place]) 12/16/2014  . Nausea vomiting and diarrhea 09/03/2014  . Hyperkalemia 09/03/2014  . Diarrhea 09/03/2014  . ESRD (end stage renal disease) ([place])   . Pulmonary hypertension ([place]) 09/23/2012  . Hyperkalemia, diminished renal excretion 06/27/2012  . Generalized convulsive epilepsy without  mention of intractable epilepsy 09/14/2011  . Epilepsy ([place]) 05/04/2011  . Physical deconditioning 05/04/2011  . HTN (hypertension) 04/30/2011  . Depression 04/30/2011  . GERD (gastroesophageal reflux disease) 04/30/2011  . Acute pulmonary edema ([place]) 04/29/2011  . Nonspecific abnormal electrocardiogram (ECG) (EKG) 02/23/2011  . Essential hypertension, malignant 05/11/2009  . Chronic diastolic heart failure ([place]) 05/11/2009  . TOBACCO USER 07/27/2008  . Coronary atherosclerosis 07/27/2008  . CVA 07/27/2008  . PVD 07/27/2008  . ESRD 07/27/2008  . OTHER AND UNSPECIFIED  HYPERLIPIDEMIA 07/24/2008    Past Surgical History:  Procedure Laterality Date  . ABDOMINAL AORTOGRAM W/LOWER EXTREMITY Bilateral 06/15/2016   Procedure: Abdominal Aortogram w/Lower Extremity;  Surgeon: Conrad Brantley, MD;  Location: [facility] CV LAB;  Service: Cardiovascular;  Laterality: Bilateral;  . ABDOMINAL HYSTERECTOMY    . APPENDECTOMY    . APPLICATION OF WOUND VAC Left 09/12/2016   Procedure: APPLICATION OF WOUND VAC;  Surgeon: Conrad Sanostee, MD;  Location: [facility];  Service: Vascular;  Laterality: Left;  . ARTERY EXPLORATION Left 07/25/2016   Procedure: ARTERY EXPLORATION LEFT ABOVE KNEE POPLITEAL AND GROIN;  Surgeon: Conrad Dudley, MD;  Location: [facility];  Service: Vascular;  Laterality: Left;  . AV FISTULA PLACEMENT Left 04/10/2017   Procedure: INSERTION OF ARTERIOVENOUS (AV) GORE-TEX GRAFT REDO LEFT UPPER ARM;  Surgeon: Conrad Blyn, MD;  Location: [facility];  Service: Vascular;  Laterality: Left;  . CARDIAC CATHETERIZATION     last 2016  . CHOLECYSTECTOMY    . COLONOSCOPY    . D&Cs    . HEMATOMA EVACUATION Left 09/12/2016   Procedure: EVACUATION HEMATOMA;  Surgeon: Conrad Webber, MD;  Location: [facility];  Service: Vascular;  Laterality: Left;  . INSERTION OF DIALYSIS CATHETER Left 04/17/2017   Procedure: INSERTION OF DIALYSIS CATHETER LEFT GROIN;  Surgeon: Conrad Benns Church, MD;  Location: [facility];  Service: Vascular;  Laterality: Left;  . INSERTION OF DIALYSIS CATHETER Right 04/10/2017   Procedure: INSERTION OF DIALYSIS CATHETER;  Surgeon: Conrad Flagler Beach, MD;  Location: [facility] OR;  Service: Vascular;  Laterality: Right;  . IR RADIOLOGIST EVAL & MGMT  03/13/2017  . LEFT HEART CATH AND CORONARY ANGIOGRAPHY N/A 11/22/2016   Procedure: LEFT HEART CATH AND CORONARY ANGIOGRAPHY;  Surgeon: Belva Crome, MD;  Location: [facility] CV LAB;  Service: Cardiovascular;  Laterality: N/A;  . LEFT HEART CATHETERIZATION WITH CORONARY ANGIOGRAM N/A 02/22/2011   Procedure: LEFT HEART CATHETERIZATION WITH CORONARY  ANGIOGRAM;  Surgeon: Wellington Hampshire, MD;  Location: [facility] CATH LAB;  Service: Cardiovascular;  Laterality: N/A;  . left nephrectomy    . MULTIPLE TOOTH EXTRACTIONS    . NEPHRECTOMY Left    Malignant tumor  . PERIPHERAL VASCULAR CATHETERIZATION N/A 12/31/2014   Procedure: Abdominal Aortogram;  Surgeon: Conrad , MD;  Location: [facility] CV LAB;  Service: Cardiovascular;  Laterality: N/A;  . right ankle repair    . RIGHT HEART CATHETERIZATION N/A 01/29/2014   Procedure: RIGHT HEART CATH;  Surgeon: Larey Dresser, MD;  Location: [facility] CATH LAB;  Service: Cardiovascular;  Laterality: N/A;  . TONSILLECTOMY    . TUBAL LIGATION    . ULTRASOUND GUIDANCE FOR VASCULAR ACCESS  11/22/2016   Procedure: Ultrasound Guidance For Vascular Access;  Surgeon: Belva Crome, MD;  Location: [facility] CV LAB;  Service: Cardiovascular;;  . UPPER EXTREMITY VENOGRAPHY Bilateral 03/22/2017   Procedure: UPPER EXTREMITY VENOGRAPHY;  Surgeon: Conrad , MD;  Location: [facility];  Service: Cardiovascular;  Laterality: Bilateral;  bilateral  . VASCULAR SURGERY  11/2010   graft inserted to left arm     OB History   None      Home Medications    Prior to Admission medications   Medication Sig Start Date End Date Taking? Authorizing Provider  acetaminophen (TYLENOL) 500 MG tablet Take 1,000 mg by mouth daily as needed for mild pain.     [provider]  albuterol (PROVENTIL HFA;VENTOLIN HFA) 108 (90 Base) MCG/ACT inhaler Inhale 1-2 puffs into the lungs every 6 (six) hours as needed for wheezing or shortness of breath. 01/05/17   Marney Setting, NP  aspirin EC 81 MG EC tablet Take 1 tablet (81 mg total) by mouth daily. 04/20/17   Debbe Odea, MD  cefTAZidime 2 g in sodium chloride 0.9 % 100 mL Inject 2 g into the vein every Monday, Wednesday, and Friday with hemodialysis for 1 day. 04/21/17 04/22/17  Debbe Odea, MD  cilostazol (PLETAL) 100 MG tablet Take 1 tablet (100 mg total) by mouth  2 (two) times daily before a meal. 05/24/16   Conrad Meadowlands, MD  digoxin (LANOXIN) 0.125 MG tablet Take 1 tablet (0.125 mg total) by mouth 3 (three) times a week. 04/19/17   Debbe Odea, MD  diltiazem (CARDIZEM) 60 MG tablet Take 1 tablet (60 mg total) by mouth 3 (three) times daily. Take BP prior to each dose and Hold if SBP < 90 04/19/17   Debbe Odea, MD  diphenhydrAMINE (BENADRYL) 50 MG capsule Take 1 capsule (50 mg total) by mouth every Monday, Wednesday, and Friday. On dialysis days 09/15/16   Gabriel Earing, PA-C  folic acid-vitamin b complex-vitamin c-selenium-zinc (DIALYVITE) 3 MG TABS tablet Take 1 tablet by mouth daily.    [provider]  HYDROcodone-acetaminophen (NORCO) 5-325 MG tablet Take 1 tablet by mouth every 6 (six) hours as needed for moderate pain. 04/10/17   Dagoberto Ligas, PA-C  hydrocortisone (ANUSOL-HC) 25 MG suppository Place 1 suppository (25 mg total) rectally 2 (two) times daily. 04/19/17   Debbe Odea, MD  isosorbide dinitrate (ISORDIL) 20 MG tablet Take 1 tablet (20 mg total) by mouth 3 (three) times daily. 10/26/16   Minus Breeding, MD  LETAIRIS 5 MG tablet TAKE 1 TABLET (5MG ) BY MOUTH DAILY. DO NOT HANDLE IF PREGNANT. DO NOTSPLIT, CRUSH, OR CHEW. AVOID INHALATION AND CONTACT WITH SKIN OR EYES. 09/18/16   Juanito Doom, MD  levETIRAcetam (KEPPRA) 500 MG tablet Take 500 mg by mouth daily at 8 pm.    [provider]  levothyroxine (SYNTHROID, LEVOTHROID) 150 MCG tablet Take 150 mcg by mouth daily before breakfast.    [provider]  multivitamin (RENA-VIT) TABS tablet Take 1 tablet by mouth at bedtime. 04/19/17   Debbe Odea, MD  nitroGLYCERIN (NITROSTAT) 0.4 MG SL tablet Place 1 tablet (0.4 mg total) under the tongue every 5 (five) minutes as needed for chest pain. 10/26/16   Minus Breeding, MD  omeprazole (PRILOSEC OTC) 20 MG tablet Take 20 mg by mouth daily.    [provider]  pravastatin (PRAVACHOL) 40 MG tablet Take 1  tablet (40 mg total) by mouth at bedtime. 05/23/16   Minus Breeding, MD  ranolazine (RANEXA) 500 MG 12 hr tablet Take 1 tablet (500 mg total) by mouth 2 (two) times daily. 04/19/17   Debbe Odea, MD  RENVELA 800 MG tablet Take 1,600 mg by mouth 3 (three) times daily with meals. May take 1600 mg  with each snack 03/08/17   [provider]  senna-docusate (SENOKOT-S) 8.6-50 MG tablet Take 2 tablets by mouth at bedtime as needed for mild constipation or moderate constipation. 04/19/17   Debbe Odea, MD  SENSIPAR 90 MG tablet Take 90 mg by mouth every Monday, Wednesday, and Friday with hemodialysis.  03/05/14   [provider]  traZODone (DESYREL) 50 MG tablet Take 50 mg by mouth at bedtime as needed for sleep.     [provider]  Vancomycin (VANCOCIN) 750-5 MG/150ML-% SOLN Inject 150 mLs (750 mg total) into the vein every Monday, Wednesday, and Friday with hemodialysis for 1 day. 04/21/17 04/22/17  Debbe Odea, MD  warfarin (COUMADIN) 2.5 MG tablet Take 5 mg daily and have INR checked on Monday 04/19/17   Debbe Odea, MD    Family History Family History  Problem Relation Age of Onset  . Heart disease Father        CAD, died age 27  . Hypertension Mother   . Dementia Mother   . Coronary artery disease Sister 31  . Hypertension Brother     Social History Social History   Tobacco Use  . Smoking status: Former Smoker    Packs/day: 0.00    Years: 53.00    Pack years: 0.00    Types: Cigarettes  . Smokeless tobacco: Never Used  . Tobacco comment: quit smoking cigarettes in January 2019  Substance Use Topics  . Alcohol use: Yes    Alcohol/week: 0.0 oz    Comment: very rarely per pt  . Drug use: No    Comment: former marijuana use, several years     Allergies   Ace inhibitors; Heparin; Iohexol; Phenytoin; Wellbutrin [bupropion]; Meperidine hcl; Morphine; Penicillins; Valproic acid and related; Iodinated diagnostic agents; and Pentazocine lactate   Review of  Systems Review of Systems  Cardiovascular: Positive for chest pain.  All other systems reviewed and are negative.    Physical Exam Updated Vital Signs BP 111/74   Pulse (!) 128   Temp 98.1 F (36.7 C) (Oral)   Resp 18   Ht 5\' 2"  (1.575 m)   Wt 77.1 kg (170 lb)   SpO2 96%   BMI 31.09 kg/m   Physical Exam  Constitutional: She is oriented to person, place, and time. She appears well-developed and well-nourished.  HENT:  Head: Normocephalic.  Mouth/Throat: Oropharynx is clear and moist.  Eyes: EOM are normal.  Neck: Normal range of motion.  Cardiovascular:  tachycardia  Pulmonary/Chest: Effort normal.  Abdominal: Soft. She exhibits no distension.  Musculoskeletal: Normal range of motion.  Neurological: She is alert and oriented to person, place, and time.  Psychiatric: She has a normal mood and affect.  Nursing note and vitals reviewed.    ED Treatments / Results  Labs (all labs ordered are listed, but only abnormal results are displayed) Labs Reviewed  CBC WITH DIFFERENTIAL/PLATELET - Abnormal; Notable for the following components:      Result Value   RBC 3.39 (*)    Hemoglobin 9.4 (*)    HCT 28.9 (*)    RDW 19.7 (*)    Platelets 129 (*)    All other components within normal limits  COMPREHENSIVE METABOLIC PANEL - Abnormal; Notable for the following components:   Chloride 98 (*)    BUN 50 (*)    Creatinine, Ser 7.11 (*)    Total Protein 6.4 (*)    Albumin 3.2 (*)    GFR calc non Af Amer 5 (*)  GFR calc Af Amer 6 (*)    Anion gap 16 (*)    All other components within normal limits  TROPONIN I - Abnormal; Notable for the following components:   Troponin I 0.26 (*)    All other components within normal limits  PROTIME-INR - Abnormal; Notable for the following components:   Prothrombin Time 15.7 (*)    All other components within normal limits  DIGOXIN LEVEL    EKG EKG Interpretation  Date/Time:  Friday April 20 2017 07:23:02 EDT Ventricular Rate:    146 PR Interval:    QRS Duration: 88 QT Interval:  284 QTC Calculation: 443 R Axis:   -156 Text Interpretation:  Atrial fibrillation suspected Abnormal lateral Q waves Anterior infarct, old LOW VOLTAGE No significant change since last tracing Confirmed by Gareth Morgan [phone]) on 04/20/2017 7:45:52 AM   Radiology Dg Chest Portable 1 View  Result Date: 04/20/2017 CLINICAL DATA:  Shortness of breath, tachycardia, swelling EXAM: PORTABLE CHEST 1 VIEW COMPARISON:  04/17/2017 FINDINGS: There is bilateral interstitial thickening. There is no focal consolidation. There is no pleural effusion or pneumothorax. There is stable cardiomegaly. There is a central venous catheter coursing from the lower abdomen with the tip terminating in the lower SVC. The osseous structures are unremarkable. IMPRESSION: Cardiomegaly with mild pulmonary vascular congestion. Electronically Signed   By: Kathreen Devoid   On: 04/20/2017 08:45    Procedures Procedures (including critical care time)  Medications Ordered in ED Medications  diltiazem (CARDIZEM) 1 mg/mL load via infusion 10 mg (5 mg Intravenous Bolus from Bag 04/20/17 0821)    And  diltiazem (CARDIZEM) 100 mg in dextrose 5% 158mL (1 mg/mL) infusion (7 mg/hr Intravenous Rate/Dose Change 04/20/17 1020)     Initial Impression / Assessment and Plan / ED Course  I have reviewed the triage vital signs and the nursing notes.  Pertinent labs & imaging results that were available during my care of the patient were reviewed by me and considered in my medical decision making (see chart for details).  Clinical Course as of Apr 20 1112  Fri Apr 20, 2017  1113 CBC with Differential/Platelet(!) [LS]    Clinical Course User Index [LS] Fransico Meadow, PA-C    Pt started on cardizem drip.   Pt had decreased overall heart rate.  Original rate 120-140. No change with bolus and rate of 5.  Pt increased to 7.  Rate varies from 60-110.   I spoke to Cardiology who will see  and evaluate for rate control.   I spoke to Dr. Jonnie Finner Nephrology who will follow   Final Clinical Impressions(s) / ED Diagnoses   Final diagnoses:  Atrial fibrillation, unspecified type [facility])    ED Discharge Orders    None       Sidney Ace 04/20/17 1312    Gareth Morgan, MD 04/20/17 2158

## 2017-04-20 NOTE — Progress Notes (Signed)
ANTICOAGULATION CONSULT NOTE - Initial Consult  Pharmacy Consult for warfarin Indication: atrial fibrillation  Allergies  Allergen Reactions  . Ace Inhibitors Anaphylaxis and Rash  . Heparin Other (See Comments)    MDs told her not to take after reaction in ICU. Tolerates Lovenox fine  . Iohexol Swelling and Other (See Comments)    1970s; passed out and had facial/tongue swelling.  Requires 13-hour prep with prednisone and benadryl  . Phenytoin Other (See Comments)    Had reaction while in ICU; doesn't know.  MDs told her not to take ever again.  . Wellbutrin [Bupropion] Other (See Comments)    seizures  . Meperidine Hcl Swelling and Rash    Makes tongue swell  . Morphine Rash  . Penicillins Hives and Rash    Has patient had a PCN reaction causing immediate rash, facial/tongue/throat swelling, SOB or lightheadedness with hypotension: Yes Has patient had a PCN reaction causing severe rash involving mucus membranes or skin necrosis: No Has patient had a PCN reaction that required hospitalization: No Has patient had a PCN reaction occurring within the last 10 years: No If all of the above answers are "NO", then may proceed with Cephalosporin use.    . Valproic Acid And Related Other (See Comments)    Confusion   . Iodinated Diagnostic Agents Other (See Comments)    Pre-meditate with benadryl and prednisone 3 times before appt.  . Pentazocine Lactate Other (See Comments)    Patient does not remember reaction to this med ([place]).     Patient Measurements: Height: 5\' 2"  (157.5 cm) Weight: 170 lb (77.1 kg) IBW/kg (Calculated) : 50.1   Vital Signs: Temp: 98.1 F (36.7 C) (04/19 0723) Temp Source: Oral (04/19 0723) BP: 107/85 (04/19 1230) Pulse Rate: 37 (04/19 1145)  Labs: Recent Labs    04/17/17 1620 04/18/17 0803 04/18/17 0847 04/19/17 0958 04/20/17 0730  HGB  --   --  8.8*  --  9.4*  HCT  --   --  27.3*  --  28.9*  PLT  --   --  133*  --  129*  LABPROT  --   20.6*  --  16.2* 15.7*  INR  --  1.79  --  1.32 1.26  CREATININE  --  7.30*  --   --  7.11*  TROPONINI 0.33*  --   --   --  0.26*    Estimated Creatinine Clearance: 6.9 mL/min (A) (by C-G formula based on SCr of 7.11 mg/dL (H)).   Medical History: Past Medical History:  Diagnosis Date  . Anemia    History  of Blood transfusion  . Asthma    PMH  . Atrial fibrillation with RVR ([place]) 11/2016  . CAD (coronary artery disease)    stent to RCA  . Cancer ([place])    clear cell cancer, kidney  . Complication of anesthesia 12/2010   pt is very confused, with AMS with anesthesia  . CVA (cerebral infarction) 2003   no apparent residual  . Dyslipidemia   . Encephalopathy   . ESRD 07/27/2008   ESRD due to HTN and NSAID's, started hemodialysis in 2005 in [place], [place]. Went to [facility] from 2010 to 2012 and since 2012 has been getting dialysis at [facility] on [address] in [place] on a MWF schedule. First access with RUA AVG placed in [place]. Next and current access was LUA AVG placed by Dr. Lucky Cowboy in [place] in or around 2012. Has had 2 or 3  procedures on that graft since placed per family. She gets her access work done here in [place] now. She is allergic to heparin and does not get any heparin at dialysis; she had an allergic reaction apparently when in ICU in the past.      . GERD (gastroesophageal reflux disease)   . Hyperlipidemia   . Hypertension   . Hypothyroidism   . Myocardial infarction ([place])    per pt  . Positive PPD    completed rifampin  . Pulmonary hypertension ([place])   . PVD (peripheral vascular disease) ([place])   . Seizures ([place])    last seizure 6 years ago  . Sleep apnea    Sleep Study 2008  . Traumatic seroma of left lower leg ([place])   . Wears partial dentures      Assessment: 72 yo female with AFib also with ESRD on HD amongst other comorbidities. Current INR 1.26, she has warfarin precsribed as an outpatient but states she has not been taking it.  Her outpatient dose of warfarin is 7.5 mg po daily.   Goal of Therapy:  INR 2-3 Monitor platelets by anticoagulation protocol: Yes    Plan:  -warfarin 7.5 mg po x1 -daily INR   Harvel Quale 04/20/2017,1:19 PM

## 2017-04-20 NOTE — H&P (Signed)
History and Physical    [name] OZH:[id number] DOB: [birth year] DOA: 04/20/2017  PCP: Glendale Chard, MD Patient coming from: home  Chief Complaint: chest pain  HPI: [name] is a 72 y.o. female with medical history significant for end-stage renal disease on dialysis, hypertension, pulmonary hypertension, hyperlipidemia, CAD with previous RCA stenting, obstructive sleep apnea and atrial fibrillation on Coumadin presents to the emergency department with the chief complaint of chest pain. Initial evaluation reveals atrial fibrillation with rapid ventricular response. [facility] are asked to admit  Information is obtained from the patient and the chart. She reports she was discharged yesterday after a five-day hospitalization for atrial fib with RVR, elevated troponin without acute EKG changes, anemia, infection and dialysis catheter. She reports she at home yesterday afternoon went to bed. She did not feel well as she believes "I should not have come home". She reports she went to dialysis center today they referred her to the emergency department for a heart rate range 80-140. She denies headache dizziness syncope or near-syncope. She does endorse some intermittent chest pain and palpitations that she says are chronic. She denies shortness of breath diaphoresis nausea vomiting diarrhea. She denies any lower extremity edema cough fever chills. She reports she did not take her medication today she usually does this after dialysis.    ED Course: in the emergency department she's afebrile with a heart rate in 147 is not hypoxic she is provided with Cardizem drip. At the time of admission rate 64 blood pressure 120/61.  Review of Systems: As per HPI otherwise all other systems reviewed and are negative.   Ambulatory Status:amylase with a walker.Lives home alone but could do some assistance with ADLs  Past Medical History:  Diagnosis Date  . Anemia    History  of Blood  transfusion  . Asthma    PMH  . Atrial fibrillation with RVR ([place]) 11/2016  . CAD (coronary artery disease)    stent to RCA  . Cancer ([place])    clear cell cancer, kidney  . Complication of anesthesia 12/2010   pt is very confused, with AMS with anesthesia  . CVA (cerebral infarction) 2003   no apparent residual  . Dyslipidemia   . Encephalopathy   . ESRD 07/27/2008   ESRD due to HTN and NSAID's, started hemodialysis in 2005 in [place], [place]. Went to [facility] from 2010 to 2012 and since 2012 has been getting dialysis at [facility] on [address] in [place] on a MWF schedule. First access with RUA AVG placed in [place]. Next and current access was LUA AVG placed by Dr. Lucky Cowboy in [place] in or around 2012. Has had 2 or 3 procedures on that graft since placed per family. She gets her access work done here in [place] now. She is allergic to heparin and does not get any heparin at dialysis; she had an allergic reaction apparently when in ICU in the past.      . GERD (gastroesophageal reflux disease)   . Hyperlipidemia   . Hypertension   . Hypothyroidism   . Myocardial infarction ([place])    per pt  . Positive PPD    completed rifampin  . Pulmonary hypertension ([place])   . PVD (peripheral vascular disease) ([place])   . Seizures ([place])    last seizure 6 years ago  . Sleep apnea    Sleep Study 2008  . Traumatic seroma of left lower leg ([place])   . Wears partial dentures  Past Surgical History:  Procedure Laterality Date  . ABDOMINAL AORTOGRAM W/LOWER EXTREMITY Bilateral 06/15/2016   Procedure: Abdominal Aortogram w/Lower Extremity;  Surgeon: Conrad Hosford, MD;  Location: [facility];  Service: Cardiovascular;  Laterality: Bilateral;  . ABDOMINAL HYSTERECTOMY    . APPENDECTOMY    . APPLICATION OF WOUND VAC Left 09/12/2016   Procedure: APPLICATION OF WOUND VAC;  Surgeon: Conrad Casa Blanca, MD;  Location: [facility];  Service: Vascular;  Laterality: Left;  . ARTERY EXPLORATION Left  07/25/2016   Procedure: ARTERY EXPLORATION LEFT ABOVE KNEE POPLITEAL AND GROIN;  Surgeon: Conrad Iowa, MD;  Location: [facility];  Service: Vascular;  Laterality: Left;  . AV FISTULA PLACEMENT Left 04/10/2017   Procedure: INSERTION OF ARTERIOVENOUS (AV) GORE-TEX GRAFT REDO LEFT UPPER ARM;  Surgeon: Conrad Bullock, MD;  Location: [facility];  Service: Vascular;  Laterality: Left;  . CARDIAC CATHETERIZATION     last 2016  . CHOLECYSTECTOMY    . COLONOSCOPY    . D&Cs    . HEMATOMA EVACUATION Left 09/12/2016   Procedure: EVACUATION HEMATOMA;  Surgeon: Conrad Cornlea, MD;  Location: [facility];  Service: Vascular;  Laterality: Left;  . INSERTION OF DIALYSIS CATHETER Left 04/17/2017   Procedure: INSERTION OF DIALYSIS CATHETER LEFT GROIN;  Surgeon: Conrad Zanesville, MD;  Location: [facility];  Service: Vascular;  Laterality: Left;  . INSERTION OF DIALYSIS CATHETER Right 04/10/2017   Procedure: INSERTION OF DIALYSIS CATHETER;  Surgeon: Conrad Chama, MD;  Location: [facility] OR;  Service: Vascular;  Laterality: Right;  . IR RADIOLOGIST EVAL & MGMT  03/13/2017  . LEFT HEART CATH AND CORONARY ANGIOGRAPHY N/A 11/22/2016   Procedure: LEFT HEART CATH AND CORONARY ANGIOGRAPHY;  Surgeon: Belva Crome, MD;  Location: [facility];  Service: Cardiovascular;  Laterality: N/A;  . LEFT HEART CATHETERIZATION WITH CORONARY ANGIOGRAM N/A 02/22/2011   Procedure: LEFT HEART CATHETERIZATION WITH CORONARY ANGIOGRAM;  Surgeon: Wellington Hampshire, MD;  Location: [facility];  Service: Cardiovascular;  Laterality: N/A;  . left nephrectomy    . MULTIPLE TOOTH EXTRACTIONS    . NEPHRECTOMY Left    Malignant tumor  . PERIPHERAL VASCULAR CATHETERIZATION N/A 12/31/2014   Procedure: Abdominal Aortogram;  Surgeon: Conrad Lakehurst, MD;  Location: [facility];  Service: Cardiovascular;  Laterality: N/A;  . right ankle repair    . RIGHT HEART CATHETERIZATION N/A 01/29/2014   Procedure: RIGHT HEART CATH;  Surgeon: Larey Dresser, MD;  Location: [facility];   Service: Cardiovascular;  Laterality: N/A;  . TONSILLECTOMY    . TUBAL LIGATION    . ULTRASOUND GUIDANCE FOR VASCULAR ACCESS  11/22/2016   Procedure: Ultrasound Guidance For Vascular Access;  Surgeon: Belva Crome, MD;  Location: [facility];  Service: Cardiovascular;;  . UPPER EXTREMITY VENOGRAPHY Bilateral 03/22/2017   Procedure: UPPER EXTREMITY VENOGRAPHY;  Surgeon: Conrad Genola, MD;  Location: [facility];  Service: Cardiovascular;  Laterality: Bilateral;  bilateral  . VASCULAR SURGERY  11/2010   graft inserted to left arm    Social History   Socioeconomic History  . Marital status: Divorced    Spouse name: Not on file  . Number of children: 2  . Years of education: 64  . Highest education level: Not on file  Occupational History  . Occupation: Retired Optician, dispensing: RETIRED  Social Needs  . Financial resource strain: Not on file  . Food insecurity:  Worry: Not on file    Inability: Not on file  . Transportation needs:    Medical: Not on file    Non-medical: Not on file  Tobacco Use  . Smoking status: Former Smoker    Packs/day: 0.00    Years: 53.00    Pack years: 0.00    Types: Cigarettes  . Smokeless tobacco: Never Used  . Tobacco comment: quit smoking cigarettes in January 2019  Substance and Sexual Activity  . Alcohol use: Yes    Alcohol/week: 0.0 oz    Comment: very rarely per pt  . Drug use: No    Comment: former marijuana use, several years  . Sexual activity: Not Currently    Birth control/protection: Post-menopausal  Lifestyle  . Physical activity:    Days per week: Not on file    Minutes per session: Not on file  . Stress: Not on file  Relationships  . Social connections:    Talks on phone: Not on file    Gets together: Not on file    Attends religious service: Not on file    Active member of club or organization: Not on file    Attends meetings of clubs or organizations: Not on file    Relationship status: Not on file  .  Intimate partner violence:    Fear of current or ex partner: Not on file    Emotionally abused: Not on file    Physically abused: Not on file    Forced sexual activity: Not on file  Other Topics Concern  . Not on file  Social History Narrative  . Not on file    Allergies  Allergen Reactions  . Ace Inhibitors Anaphylaxis and Rash  . Heparin Other (See Comments)    MDs told her not to take after reaction in ICU. Tolerates Lovenox fine  . Iohexol Swelling and Other (See Comments)    1970s; passed out and had facial/tongue swelling.  Requires 13-hour prep with prednisone and benadryl  . Phenytoin Other (See Comments)    Had reaction while in ICU; doesn't know.  MDs told her not to take ever again.  . Wellbutrin [Bupropion] Other (See Comments)    seizures  . Meperidine Hcl Swelling and Rash    Makes tongue swell  . Morphine Rash  . Penicillins Hives and Rash    Has patient had a PCN reaction causing immediate rash, facial/tongue/throat swelling, SOB or lightheadedness with hypotension: Yes Has patient had a PCN reaction causing severe rash involving mucus membranes or skin necrosis: No Has patient had a PCN reaction that required hospitalization: No Has patient had a PCN reaction occurring within the last 10 years: No If all of the above answers are "NO", then may proceed with Cephalosporin use.    . Valproic Acid And Related Other (See Comments)    Confusion   . Iodinated Diagnostic Agents Other (See Comments)    Pre-meditate with benadryl and prednisone 3 times before appt.  . Pentazocine Lactate Other (See Comments)    Patient does not remember reaction to this med ([place]).     Family History  Problem Relation Age of Onset  . Heart disease Father        CAD, died age 37  . Hypertension Mother   . Dementia Mother   . Coronary artery disease Sister 82  . Hypertension Brother     Prior to Admission medications   Medication Sig Start Date End Date Taking? Authorizing  Provider  acetaminophen (  TYLENOL) 500 MG tablet Take 1,000 mg by mouth daily as needed for mild pain.     [provider]  albuterol (PROVENTIL HFA;VENTOLIN HFA) 108 (90 Base) MCG/ACT inhaler Inhale 1-2 puffs into the lungs every 6 (six) hours as needed for wheezing or shortness of breath. 01/05/17   Marney Setting, NP  aspirin EC 81 MG EC tablet Take 1 tablet (81 mg total) by mouth daily. 04/20/17   Debbe Odea, MD  cefTAZidime 2 g in sodium chloride 0.9 % 100 mL Inject 2 g into the vein every Monday, Wednesday, and Friday with hemodialysis for 1 day. 04/21/17 04/22/17  Debbe Odea, MD  cilostazol (PLETAL) 100 MG tablet Take 1 tablet (100 mg total) by mouth 2 (two) times daily before a meal. 05/24/16   Conrad Harrison, MD  digoxin (LANOXIN) 0.125 MG tablet Take 1 tablet (0.125 mg total) by mouth 3 (three) times a week. 04/19/17   Debbe Odea, MD  diltiazem (CARDIZEM) 60 MG tablet Take 1 tablet (60 mg total) by mouth 3 (three) times daily. Take BP prior to each dose and Hold if SBP < 90 04/19/17   Debbe Odea, MD  diphenhydrAMINE (BENADRYL) 50 MG capsule Take 1 capsule (50 mg total) by mouth every Monday, Wednesday, and Friday. On dialysis days 09/15/16   Gabriel Earing, PA-C  folic acid-vitamin b complex-vitamin c-selenium-zinc (DIALYVITE) 3 MG TABS tablet Take 1 tablet by mouth daily.    [provider]  HYDROcodone-acetaminophen (NORCO) 5-325 MG tablet Take 1 tablet by mouth every 6 (six) hours as needed for moderate pain. 04/10/17   Dagoberto Ligas, PA-C  hydrocortisone (ANUSOL-HC) 25 MG suppository Place 1 suppository (25 mg total) rectally 2 (two) times daily. 04/19/17   Debbe Odea, MD  isosorbide dinitrate (ISORDIL) 20 MG tablet Take 1 tablet (20 mg total) by mouth 3 (three) times daily. 10/26/16   Minus Breeding, MD  LETAIRIS 5 MG tablet TAKE 1 TABLET (5MG ) BY MOUTH DAILY. DO NOT HANDLE IF PREGNANT. DO NOTSPLIT, CRUSH, OR CHEW. AVOID INHALATION AND CONTACT WITH SKIN OR  EYES. 09/18/16   Juanito Doom, MD  levETIRAcetam (KEPPRA) 500 MG tablet Take 500 mg by mouth daily at 8 pm.    [provider]  levothyroxine (SYNTHROID, LEVOTHROID) 150 MCG tablet Take 150 mcg by mouth daily before breakfast.    [provider]  multivitamin (RENA-VIT) TABS tablet Take 1 tablet by mouth at bedtime. 04/19/17   Debbe Odea, MD  nitroGLYCERIN (NITROSTAT) 0.4 MG SL tablet Place 1 tablet (0.4 mg total) under the tongue every 5 (five) minutes as needed for chest pain. 10/26/16   Minus Breeding, MD  omeprazole (PRILOSEC OTC) 20 MG tablet Take 20 mg by mouth daily.    [provider]  pravastatin (PRAVACHOL) 40 MG tablet Take 1 tablet (40 mg total) by mouth at bedtime. 05/23/16   Minus Breeding, MD  ranolazine (RANEXA) 500 MG 12 hr tablet Take 1 tablet (500 mg total) by mouth 2 (two) times daily. 04/19/17   Debbe Odea, MD  RENVELA 800 MG tablet Take 1,600 mg by mouth 3 (three) times daily with meals. May take 1600 mg with each snack 03/08/17   [provider]  senna-docusate (SENOKOT-S) 8.6-50 MG tablet Take 2 tablets by mouth at bedtime as needed for mild constipation or moderate constipation. 04/19/17   Debbe Odea, MD  SENSIPAR 90 MG tablet Take 90 mg by mouth every Monday, Wednesday, and Friday with hemodialysis.  03/05/14  [provider]  traZODone (DESYREL) 50 MG tablet Take 50 mg by mouth at bedtime as needed for sleep.     [provider]  Vancomycin (VANCOCIN) 750-5 MG/150ML-% SOLN Inject 150 mLs (750 mg total) into the vein every Monday, Wednesday, and Friday with hemodialysis for 1 day. 04/21/17 04/22/17  Debbe Odea, MD  warfarin (COUMADIN) 2.5 MG tablet Take 5 mg daily and have INR checked on Monday 04/19/17   Debbe Odea, MD    Physical Exam: Vitals:   04/20/17 1145 04/20/17 1200 04/20/17 1215 04/20/17 1230  BP: 107/64 102/71 119/66 107/85  Pulse: (!) 37     Resp: (!) 23 15 13 17   Temp:      TempSrc:        SpO2: (!) 70%     Weight:      Height:         General:  Appears slightly anxious but not uncomfortable Eyes:  PERRL, EOMI, normal lids, iris ENT:  grossly normal hearing, lips & tongue, mmm Neck:  no LAD, masses or thyromegaly Cardiovascular:  Irregularly irregular, no m/r/g. No LE edema.  Respiratory:  CTA bilaterally, no w/r/r. Normal respiratory effort. Abdomen:  soft, ntnd, positive bowel sounds throughout Skin:  no rash or induration seen on limited exam Musculoskeletal:  grossly normal tone BUE/BLE, good ROM, no bony abnormality Psychiatric:  grossly normal mood and affect, speech fluent and appropriate, AOx3 Neurologic:  CN 2-12 grossly intact, moves all extremities in coordinated fashion, sensation intact moves all extremities spontaneously speech clear  Labs on Admission: I have personally reviewed following labs and imaging studies  CBC: Recent Labs  Lab 04/14/17 2359 04/15/17 0556 04/17/17 0559 04/17/17 1026 04/18/17 0847 04/20/17 0730  WBC 4.4 3.9* 4.4 4.6 4.9 6.1  NEUTROABS 2.8  --  2.5  --   --  4.0  HGB 10.4* 9.4* 9.4* 9.6* 8.8* 9.4*  HCT 31.4* 28.8* 29.5* 29.3* 27.3* 28.9*  MCV 83.5 82.5 83.8 82.8 84.0 85.3  PLT 122* 120* 144* 112* 133* 962*   Basic Metabolic Panel: Recent Labs  Lab 04/15/17 0556 04/15/17 1031 04/17/17 0559 04/17/17 1026 04/18/17 0803 04/20/17 0730  NA 136  --  134* 132* 133* 137  K 4.0  --  4.5 4.4 4.5 4.4  CL 99*  --  97* 95* 97* 98*  CO2 26  --  21* 22 23 23   GLUCOSE 83  --  86 103* 149* 98  BUN 18  --  45* 44* 39* 50*  CREATININE 4.56*  --  8.33* 8.29* 7.30* 7.11*  CALCIUM 8.7*  --  9.0 8.9 9.1 9.8  PHOS  --  4.5 6.1* 6.2* 4.9*  --    GFR: Estimated Creatinine Clearance: 6.9 mL/min (A) (by C-G formula based on SCr of 7.11 mg/dL (H)). Liver Function Tests: Recent Labs  Lab 04/17/17 0559 04/17/17 1026 04/18/17 0803 04/20/17 0730  AST  --   --   --  17  ALT  --   --   --  17  ALKPHOS  --   --   --  58  BILITOT  --    --   --  0.7  PROT  --   --   --  6.4*  ALBUMIN 3.2* 3.1* 3.0* 3.2*   No results for input(s): LIPASE, AMYLASE in the last 168 hours. No results for input(s): AMMONIA in the last 168 hours. Coagulation Profile: Recent Labs  Lab 04/14/17 1427 04/15/17 0556 04/18/17 0803 04/19/17 2297  04/20/17 0730  INR 1.52 1.61 1.79 1.32 1.26   Cardiac Enzymes: Recent Labs  Lab 04/14/17 2359 04/15/17 0556 04/15/17 1031 04/17/17 1620 04/20/17 0730  TROPONINI 0.35* 0.61* 0.88* 0.33* 0.26*   BNP (last 3 results) No results for input(s): PROBNP in the last 8760 hours. HbA1C: No results for input(s): HGBA1C in the last 72 hours. CBG: No results for input(s): GLUCAP in the last 168 hours. Lipid Profile: No results for input(s): CHOL, HDL, LDLCALC, TRIG, CHOLHDL, LDLDIRECT in the last 72 hours. Thyroid Function Tests: No results for input(s): TSH, T4TOTAL, FREET4, T3FREE, THYROIDAB in the last 72 hours. Anemia Panel: No results for input(s): VITAMINB12, FOLATE, FERRITIN, TIBC, IRON, RETICCTPCT in the last 72 hours. Urine analysis:    Component Value Date/Time   COLORURINE YELLOW 09/03/2014 0018   APPEARANCEUR CLOUDY (A) 09/03/2014 0018   LABSPEC 1.010 09/03/2014 0018   PHURINE 5.5 09/03/2014 0018   GLUCOSEU 250 (A) 09/03/2014 0018   HGBUR TRACE (A) 09/03/2014 0018   BILIRUBINUR NEGATIVE 09/03/2014 0018   KETONESUR NEGATIVE 09/03/2014 0018   PROTEINUR 100 (A) 09/03/2014 0018   UROBILINOGEN 0.2 09/03/2014 0018   NITRITE NEGATIVE 09/03/2014 0018   LEUKOCYTESUR NEGATIVE 09/03/2014 0018    Creatinine Clearance: Estimated Creatinine Clearance: 6.9 mL/min (A) (by C-G formula based on SCr of 7.11 mg/dL (H)).  Sepsis Labs: @LABRCNTIP (procalcitonin:4,lacticidven:4) ) Recent Results (from the past 240 hour(s))  Aerobic Culture (superficial specimen)     Status: None (Preliminary result)   Collection Time: 04/14/17  6:45 PM  Result Value Ref Range Status   Specimen Description WOUND  RIGHT LEG  Final   Special Requests Immunocompromised  Final   Gram Stain   Final    RARE WBC PRESENT, PREDOMINANTLY PMN NO ORGANISMS SEEN    Culture   Final    RARE MOLD ISOLATE REFERRED FOR ID ONLY Performed at [facility], [address], [place], [place] [address]    Report Status PENDING  Incomplete  Culture, blood (routine x 2)     Status: None   Collection Time: 04/14/17  7:15 PM  Result Value Ref Range Status   Specimen Description BLOOD HEMODIALYSIS CATHETER  Final   Special Requests   Final    BOTTLES DRAWN AEROBIC AND ANAEROBIC Blood Culture results may not be optimal due to an excessive volume of blood received in culture bottles   Culture   Final    NO GROWTH 5 DAYS Performed at [facility], [place] [address], [place], [place] [address]    Report Status 04/19/2017 FINAL  Final  Culture, blood (routine x 2)     Status: None   Collection Time: 04/14/17  7:20 PM  Result Value Ref Range Status   Specimen Description BLOOD HEMODIALYSIS CATHETER  Final   Special Requests   Final    BOTTLES DRAWN AEROBIC AND ANAEROBIC Blood Culture results may not be optimal due to an excessive volume of blood received in culture bottles   Culture   Final    NO GROWTH 5 DAYS Performed at [facility], [place] [address] [place], [place] [address]    Report Status 04/19/2017 FINAL  Final  MRSA PCR Screening     Status: None   Collection Time: 04/15/17  1:06 AM  Result Value Ref Range Status   MRSA by PCR NEGATIVE NEGATIVE Final    Comment:        The GeneXpert MRSA Assay (FDA approved for NASAL specimens only), is  one component of a comprehensive MRSA colonization surveillance program. It is not intended to diagnose MRSA infection nor to guide or monitor treatment for MRSA infections. Performed at [facility], [place] [address], [place], [place] [address]   Culture, blood (Routine X 2) w Reflex to ID Panel     Status: None (Preliminary result)   Collection  Time: 04/16/17 10:14 AM  Result Value Ref Range Status   Specimen Description BLOOD RIGHT ARM  Final   Special Requests   Final    BOTTLES DRAWN AEROBIC AND ANAEROBIC Blood Culture adequate volume   Culture   Final    NO GROWTH 3 DAYS Performed at [facility], [place] [address] [place], [place] [address]    Report Status PENDING  Incomplete  Culture, blood (Routine X 2) w Reflex to ID Panel     Status: None (Preliminary result)   Collection Time: 04/16/17 10:21 AM  Result Value Ref Range Status   Specimen Description BLOOD RIGHT HAND  Final   Special Requests   Final    BOTTLES DRAWN AEROBIC AND ANAEROBIC Blood Culture adequate volume   Culture   Final    NO GROWTH 3 DAYS Performed at [facility], [place] [address] [place], [place] [address]    Report Status PENDING  Incomplete  Surgical pcr screen     Status: None   Collection Time: 04/17/17  2:22 AM  Result Value Ref Range Status   MRSA, PCR NEGATIVE NEGATIVE Final   Staphylococcus aureus NEGATIVE NEGATIVE Final    Comment: (NOTE) The Xpert SA Assay (FDA approved for NASAL specimens in patients 67 years of age and older), is one component of a comprehensive surveillance program. It is not intended to diagnose infection nor to guide or monitor treatment. Performed at [facility], [place] [address], [place],  [address]      Radiological Exams on Admission: Dg Chest Portable 1 View  Result Date: 04/20/2017 CLINICAL DATA:  Shortness of breath, tachycardia, swelling EXAM: PORTABLE CHEST 1 VIEW COMPARISON:  04/17/2017 FINDINGS: There is bilateral interstitial thickening. There is no focal consolidation. There is no pleural effusion or pneumothorax. There is stable cardiomegaly. There is a central venous catheter coursing from the lower abdomen with the tip terminating in the lower SVC. The osseous structures are unremarkable. IMPRESSION: Cardiomegaly with mild pulmonary vascular congestion. Electronically  Signed   By: Kathreen Devoid   On: 04/20/2017 66:06    EKG: Uncertain rhythm: review Anterior infarct, old Prolonged QT interval  Assessment/Plan Principal Problem:   Atrial fibrillation with RVR (HCC) Active Problems:   Essential hypertension, malignant   Coronary atherosclerosis   Chronic diastolic heart failure (HCC)   ESRD   Depression   GERD (gastroesophageal reflux disease)   Epilepsy (HCC)   Dyslipidemia   Elevated troponin   A-fib ([place])   #1. A. Fib with rapid ventricular response. Heart rate initially between 80 and 140 at dialysis center. She states she has not taken her medications as she usually does this after dialysis. Home medications include digoxin, diltiazem, isordil. Cardizem bolus and Cardizem drip initiated in the emergency department.  Of note last hospitalization evaluated by cardiology who noted heart rate difficult to control during last hospitalization due to transient hypotension. Cardiology made some changes to medications and recommended palliative care -Admit to step down -Continue Cardizem drip -Resume home meds -appreciate cardiology assistance  #2. CAD/elevated troponin. History of same. Troponin is trending down from  her last admission. She reports a brief episode of chest pain this morning. At time of admission pain-free -cycle troponin -continue asa, statin renexa  #3.hypertension. Fair control in the emergency department.home meds include digoxin, diltiazem, isordil -follow dig level -resume meds as indicated  4. End-stage renal disease. She is a Monday Wednesday Friday dialysis patient. She was unable to complete dialysis today.creatinine 7.1 potassium 4.4. This does not appear to be too far from baseline. -Nephrology consult for dialysis  #5.anemia. Related to chronic disease. Hemoglobin 9.4 on admission. This appears to be close to baseline. No signs symptoms of active bleeding. -Monitor  #6. Seizure disorder -Continue Keppra  #7.  Chronic diastolic heart failure.echo 6 months ago reveals an EF of 55% with mild LVH.chest x-ray reveals cardiomegaly with vascular congestion.home meds include dig, isorodil, cardizem as noted above -daily weight -intake and output    DVT prophylaxis: heparin  Code Status: full  Family Communication: none present  Disposition Plan: home  Consults called: schrtz cardmaster  Admission status: inpatient    Radene Gunning MD [facility]  If 7PM-7AM, please contact night-coverage [email or web address] Password [email or web address]  04/20/2017, 1:50 PM

## 2017-04-20 NOTE — ED Notes (Addendum)
Patient yelling because she is NPO and cannot have water at this time. MD notified and patient given water.

## 2017-04-20 NOTE — ED Notes (Signed)
Attempted to call report x 1  

## 2017-04-20 NOTE — Progress Notes (Addendum)
[facility] Progress Note   Subjective:  Discharged yesterday and returned to ED this morning from dialysis center with SOB and  elevated HR. HR 120-140s on arrival.  Has been readmitted with recurrent  Afib with RVR. Cardizem drip started.  CXR showing mild vascular congestion. Denies CP but still SOB    Objective Vitals:   04/20/17 1415 04/20/17 1430 04/20/17 1445 04/20/17 1500  BP: (!) 123/96 (!) 110/97 107/68 102/71  Pulse:      Resp: 20 20 19 19   Temp:      TempSrc:      SpO2:      Weight:      Height:       Physical Exam General:NAD, WDWN female, sitting in bed Heart:regular rate, irregular rhythm  Lungs:CTAB Abdomen:soft, NTND Extremities:no LE edema Dialysis Access: L thigh TDC, LU AVG +b/t    Dialysis Orders:  MWF [facility] 75kg 2/2 bath P4  TDC - was R thigh, now L thigh after removal / replacement -maturing LUA AVG Hep none (allergic) -Epogen 9600 Units IV qHD - Weekly dose 57846 -Sensipar 120mg  PO qHD -Calcitriol 2.62mch PO qHD   Assessment/Plan: 1. A fib w/RVR - per primary. No heparin d/t allergy. Cardizem drip started. On Warfarin  2. R thigh TDC drainage  - TDC removed and replaced, cx's negative. Will give final dose of antibiotics with HD today 3. ESRD - HD MWF. HD orders written for today. Standing weight 80.7kg pre HD. Attempt 3L UF.  4. Anemia of CKD- Hgb 9.4  S/p transfusion during prior admision, started epogen 9600 Unit qHD 5. MBD ckd  - Ca/Phos at goal.  Cont Senispar, calcitriol.  Not tolerating Fosrenol, changed to Renvela 2AC TID, w/snacks BID. 6. HTN - BP's on lower side partly due to afib meds (dilt/ coreg) 7. Nutrition - Renal diet with fluid restrictions.  8.  Seizure do - on keppra 9. Chronic CHF/CAD - per primary  10.  S/p new LUA AVG - placed on 4/4 by Dr. Bridgett Larsson, will be ready to use in about 2wks    Lynnda Child PA-C [place] Pager [phone] 04/20/2017,4:20 PM  LOS: 0 days   Pt seen,  examined and agree w A/P as above.  Kelly Splinter MD [facility] pager [phone]   04/20/2017, 5:10 PM    Additional Objective Labs: Basic Metabolic Panel: Recent Labs  Lab 04/17/17 0559 04/17/17 1026 04/18/17 0803 04/20/17 0730  NA 134* 132* 133* 137  K 4.5 4.4 4.5 4.4  CL 97* 95* 97* 98*  CO2 21* 22 23 23   GLUCOSE 86 103* 149* 98  BUN 45* 44* 39* 50*  CREATININE 8.33* 8.29* 7.30* 7.11*  CALCIUM 9.0 8.9 9.1 9.8  PHOS 6.1* 6.2* 4.9*  --    CBC: Recent Labs  Lab 04/14/17 2359 04/15/17 0556 04/17/17 0559 04/17/17 1026 04/18/17 0847 04/20/17 0730  WBC 4.4 3.9* 4.4 4.6 4.9 6.1  NEUTROABS 2.8  --  2.5  --   --  4.0  HGB 10.4* 9.4* 9.4* 9.6* 8.8* 9.4*  HCT 31.4* 28.8* 29.5* 29.3* 27.3* 28.9*  MCV 83.5 82.5 83.8 82.8 84.0 85.3  PLT 122* 120* 144* 112* 133* 129*   Blood Culture    Component Value Date/Time   SDES BLOOD RIGHT HAND 04/16/2017 1021   SPECREQUEST  04/16/2017 1021    BOTTLES DRAWN AEROBIC AND ANAEROBIC Blood Culture adequate volume   CULT  04/16/2017 1021    NO GROWTH 4 DAYS Performed at  [facility], [place] [address], [place], [place] [address]    REPTSTATUS PENDING 04/16/2017 1021    Cardiac Enzymes: Recent Labs  Lab 04/15/17 0556 04/15/17 1031 04/17/17 1620 04/20/17 0730 04/20/17 1351  TROPONINI 0.61* 0.88* 0.33* 0.26* 0.27*   CBG: No results for input(s): GLUCAP in the last 168 hours. Iron Studies: No results for input(s): IRON, TIBC, TRANSFERRIN, FERRITIN in the last 72 hours. Lab Results  Component Value Date   INR 1.26 04/20/2017   INR 1.32 04/19/2017   INR 1.79 04/18/2017   Medications: . sodium chloride    . [START ON 04/23/2017] Vancomycin     . ambrisentan  5 mg Oral Daily  . aspirin EC  81 mg Oral Daily  . cilostazol  100 mg Oral BID AC  . [START ON 04/23/2017] cinacalcet  90 mg Oral Q M,W,F-HD  . digoxin  0.125 mg Oral Once per day on Mon Wed Fri  . diltiazem  60 mg Oral TID  . diphenhydrAMINE  50  mg Oral Q M,W,F  . folic acid-vitamin b complex-vitamin c-selenium-zinc  1 tablet Oral Daily  . hydrocortisone  25 mg Rectal BID  . isosorbide dinitrate  20 mg Oral TID  . levETIRAcetam  500 mg Oral Q2000  . [START ON 04/21/2017] levothyroxine  150 mcg Oral QAC breakfast  . metoprolol succinate  100 mg Oral Daily  . multivitamin  1 tablet Oral QHS  . omeprazole  20 mg Oral Daily  . pravastatin  40 mg Oral QHS  . ranolazine  500 mg Oral BID  . sevelamer carbonate  1,600 mg Oral TID WC  . sodium chloride flush  3 mL Intravenous Q12H  . warfarin  7.5 mg Oral Once  . Warfarin - Pharmacist Dosing Inpatient   Does not apply [phone]

## 2017-04-20 NOTE — ED Notes (Signed)
Placed pt on bedpan for pt to have a BM, pt was unable to have a BM removed pt from bedpan and told pt to call if she needed it again.

## 2017-04-20 NOTE — Progress Notes (Signed)
HD tx completed @ 2335 w/o problem, UF goal met, blood rinsed back, VSS, HR started to climb back into the 120-130's toward end of tx but pt was asymptomatic and stated she felt fine, report called to Jenkins Rouge, RN

## 2017-04-20 NOTE — Progress Notes (Signed)
HD tx initiated via HD cath w/o problem, pull/push/flush equally w/o problem, VSS but w/ soft bp that is much lower than the last VS given in report,  pt had no dsg on HD cath when she arrived but rather had a strip of hypofix tape across insertion site, I asked who did that and she said she did and thought that was a dsg. I reinforced that she had just left hospital where she had a very bad infection of HD cath that was newly placed at the time and had to have it removed and it was imperative that she keep the dsg on the HD cath, pt states understanding of that education, will cont to monitor while on HD tx

## 2017-04-20 NOTE — ED Triage Notes (Signed)
Patient arrived at dialysis for her treatment and EMS was called because of an episode of A-Fib with heart rates ranging form 80 - 140. Patient states no pain and only had SOB when arriving at dialysis.

## 2017-04-20 NOTE — Progress Notes (Signed)
Patient. In dialysis.

## 2017-04-20 NOTE — Consult Note (Addendum)
Cardiology Consultation:   Patient ID: KAMEA DACOSTA; [id number]; [birth year]   Admit date: 04/20/2017 Date of Consult: 04/20/2017  Primary Care Provider: Glendale Chard, MD Primary Cardiologist: Minus Breeding, MD   Patient Profile:   [name] is a 72 y.o. female with a hx of w/ ESRD on HD pulmonary HTN, Essential HTN, HLD, CAD w/ previous RCA stenting, OSA and atrial fibrillation on warfarin who is being seen today for the evaluation of afib RVR at the request of Dr. Rockne Menghini.  She was admitted 04/14/17-04/19/17 for chest pain and SOB. Noted to have A-fib with RVR, elevated troponin but not acute EKG changes. She was anemic with Hb of 7.6 and admisson and was transfused 2 U PRBC. Noted fever and found to have infection to recently placed [facility] 4/9 in right thigh>>> s/p removal by vascular surgery on 4/14 and newly placed tunneled cath in the left thigh. Treated with abx. Hr was difficult to controlled due to soft blood pressure. Coreg changed to Toprol. Added cardizem and advised to check BP frequently and advised to hold if SBP less than 90. Digoxin 0.125 mg 3 x per week.Dr. Percival Spanish recommended palliative care but discharged.   History of Present Illness:   Ms. Basquez presented from dialysis center for afib RVR. Rate in 80-140s. She lives alone. She did not took her medications as it was confusing to her. This morning she had brief episode of sharp chest pain with spontaneous resolution. She did not take her medication this morning as she usually takes after dialysis. Currently denies any pain or dyspnea. She slept well last night. She does not wants to go home as no one available to take care of her.   Troponin 0.26 (down from last admission). Hgb 9.4. INR 1.26. CXR with vascular congestion.   Past Medical History:  Diagnosis Date  . Anemia    History  of Blood transfusion  . Asthma    PMH  . Atrial fibrillation with RVR ([place]) 11/2016  . CAD (coronary artery disease)    stent to RCA    . Cancer ([place])    clear cell cancer, kidney  . Complication of anesthesia 12/2010   pt is very confused, with AMS with anesthesia  . CVA (cerebral infarction) 2003   no apparent residual  . Dyslipidemia   . Encephalopathy   . ESRD 07/27/2008   ESRD due to HTN and NSAID's, started hemodialysis in 2005 in [place], [place]. Went to [facility] from 2010 to 2012 and since 2012 has been getting dialysis at [facility] on [address] in [place] on a MWF schedule. First access with RUA AVG placed in [place]. Next and current access was LUA AVG placed by Dr. Lucky Cowboy in [place] in or around 2012. Has had 2 or 3 procedures on that graft since placed per family. She gets her access work done here in [place] now. She is allergic to heparin and does not get any heparin at dialysis; she had an allergic reaction apparently when in ICU in the past.      . GERD (gastroesophageal reflux disease)   . Hyperlipidemia   . Hypertension   . Hypothyroidism   . Myocardial infarction ([place])    per pt  . Positive PPD    completed rifampin  . Pulmonary hypertension ([place])   . PVD (peripheral vascular disease) ([place])   . Seizures ([place])    last seizure 6 years ago  . Sleep apnea    Sleep Study 2008  .  Traumatic seroma of left lower leg ([place])   . Wears partial dentures     Past Surgical History:  Procedure Laterality Date  . ABDOMINAL AORTOGRAM W/LOWER EXTREMITY Bilateral 06/15/2016   Procedure: Abdominal Aortogram w/Lower Extremity;  Surgeon: Conrad Arab, MD;  Location: [place] [facility];  Service: Cardiovascular;  Laterality: Bilateral;  . ABDOMINAL HYSTERECTOMY    . APPENDECTOMY    . APPLICATION OF WOUND VAC Left 09/12/2016   Procedure: APPLICATION OF WOUND VAC;  Surgeon: Conrad Murf[place], MD;  Location: [place];  Service: Vascular;  Laterality: Left;  . ARTERY EXPLORATION Left 07/25/2016   Procedure: ARTERY EXPLORATION LEFT ABOVE KNEE POPLITEAL AND GROIN;  Surgeon: Conrad Pueblitos, MD;  Location: [place];   Service: Vascular;  Laterality: Left;  . AV FISTULA PLACEMENT Left 04/10/2017   Procedure: INSERTION OF ARTERIOVENOUS (AV) GORE-TEX GRAFT REDO LEFT UPPER ARM;  Surgeon: Conrad Calumet, MD;  Location: [place];  Service: Vascular;  Laterality: Left;  . CARDIAC CATHETERIZATION     last 2016  . CHOLECYSTECTOMY    . COLONOSCOPY    . D&Cs    . HEMATOMA EVACUATION Left 09/12/2016   Procedure: EVACUATION HEMATOMA;  Surgeon: Conrad Buckhead, MD;  Location: [place];  Service: Vascular;  Laterality: Left;  . INSERTION OF DIALYSIS CATHETER Left 04/17/2017   Procedure: INSERTION OF DIALYSIS CATHETER LEFT GROIN;  Surgeon: Conrad Raton, MD;  Location: [place];  Service: Vascular;  Laterality: Left;  . INSERTION OF DIALYSIS CATHETER Right 04/10/2017   Procedure: INSERTION OF DIALYSIS CATHETER;  Surgeon: Conrad [place], MD;  Location: [facility] OR;  Service: Vascular;  Laterality: Right;  . IR RADIOLOGIST EVAL & MGMT  03/13/2017  . LEFT HEART CATH AND CORONARY ANGIOGRAPHY N/A 11/22/2016   Procedure: LEFT HEART CATH AND CORONARY ANGIOGRAPHY;  Surgeon: Belva Crome, MD;  Location: [place] [facility];  Service: Cardiovascular;  Laterality: N/A;  . LEFT HEART CATHETERIZATION WITH CORONARY ANGIOGRAM N/A 02/22/2011   Procedure: LEFT HEART CATHETERIZATION WITH CORONARY ANGIOGRAM;  Surgeon: Wellington Hampshire, MD;  Location: [facility] CATH LAB;  Service: Cardiovascular;  Laterality: N/A;  . left nephrectomy    . MULTIPLE TOOTH EXTRACTIONS    . NEPHRECTOMY Left    Malignant tumor  . PERIPHERAL VASCULAR CATHETERIZATION N/A 12/31/2014   Procedure: Abdominal Aortogram;  Surgeon: Conrad Interlaken, MD;  Location: [place] [facility];  Service: Cardiovascular;  Laterality: N/A;  . right ankle repair    . RIGHT HEART CATHETERIZATION N/A 01/29/2014   Procedure: RIGHT HEART CATH;  Surgeon: Larey Dresser, MD;  Location: [facility] CATH LAB;  Service: Cardiovascular;  Laterality: N/A;  . TONSILLECTOMY    . TUBAL LIGATION    . ULTRASOUND GUIDANCE FOR VASCULAR ACCESS   11/22/2016   Procedure: Ultrasound Guidance For Vascular Access;  Surgeon: Belva Crome, MD;  Location: [facility];  Service: Cardiovascular;;  . UPPER EXTREMITY VENOGRAPHY Bilateral 03/22/2017   Procedure: UPPER EXTREMITY VENOGRAPHY;  Surgeon: Conrad [place], MD;  Location: [place];  Service: Cardiovascular;  Laterality: Bilateral;  bilateral  . VASCULAR SURGERY  11/2010   graft inserted to left arm     Inpatient Medications: Scheduled Meds: . ambrisentan  5 mg Oral Daily  . aspirin  81 mg Oral Daily  . cilostazol  100 mg Oral BID AC  . [START ON 04/23/2017] cinacalcet  90 mg Oral Q M,W,F-HD  . digoxin  0.125 mg Oral Once per day  on Mon Wed Fri  . diltiazem  60 mg Oral TID  . diphenhydrAMINE  50 mg Oral Q M,W,F  . folic acid-vitamin b complex-vitamin c-selenium-zinc  1 tablet Oral Daily  . hydrocortisone  25 mg Rectal BID  . isosorbide dinitrate  20 mg Oral TID  . levETIRAcetam  500 mg Oral Q2000  . [START ON 04/21/2017] levothyroxine  150 mcg Oral QAC breakfast  . multivitamin  1 tablet Oral QHS  . omeprazole  20 mg Oral Daily  . pravastatin  40 mg Oral QHS  . ranolazine  500 mg Oral BID  . sevelamer carbonate  1,600 mg Oral TID WC  . sodium chloride flush  3 mL Intravenous Q12H   Continuous Infusions: . sodium chloride    . diltiazem (CARDIZEM) infusion    . [START ON 04/23/2017] Vancomycin     PRN Meds: sodium chloride, acetaminophen, albuterol, HYDROcodone-acetaminophen, nitroGLYCERIN, ondansetron (ZOFRAN) IV, senna-docusate, sodium chloride flush, traZODone  Allergies:    Allergies  Allergen Reactions  . Ace Inhibitors Anaphylaxis and Rash  . Heparin Other (See Comments)    MDs told her not to take after reaction in ICU. Tolerates Lovenox fine  . Iohexol Swelling and Other (See Comments)    1970s; passed out and had facial/tongue swelling.  Requires 13-hour prep with prednisone and benadryl  . Phenytoin Other (See Comments)    Had reaction while in  ICU; doesn't know.  MDs told her not to take ever again.  . Wellbutrin [Bupropion] Other (See Comments)    seizures  . Meperidine Hcl Swelling and Rash    Makes tongue swell  . Morphine Rash  . Penicillins Hives and Rash    Has patient had a PCN reaction causing immediate rash, facial/tongue/throat swelling, SOB or lightheadedness with hypotension: Yes Has patient had a PCN reaction causing severe rash involving mucus membranes or skin necrosis: No Has patient had a PCN reaction that required hospitalization: No Has patient had a PCN reaction occurring within the last 10 years: No If all of the above answers are "NO", then may proceed with Cephalosporin use.    . Valproic Acid And Related Other (See Comments)    Confusion   . Iodinated Diagnostic Agents Other (See Comments)    Pre-meditate with benadryl and prednisone 3 times before appt.  . Pentazocine Lactate Other (See Comments)    Patient does not remember reaction to this med ([place]).     Social History:   Social History   Socioeconomic History  . Marital status: Divorced    Spouse name: Not on file  . Number of children: 2  . Years of education: 55  . Highest education level: Not on file  Occupational History  . Occupation: Retired Optician, dispensing: RETIRED  Social Needs  . Financial resource strain: Not on file  . Food insecurity:    Worry: Not on file    Inability: Not on file  . Transportation needs:    Medical: Not on file    Non-medical: Not on file  Tobacco Use  . Smoking status: Former Smoker    Packs/day: 0.00    Years: 53.00    Pack years: 0.00    Types: Cigarettes  . Smokeless tobacco: Never Used  . Tobacco comment: quit smoking cigarettes in January 2019  Substance and Sexual Activity  . Alcohol use: Yes    Alcohol/week: 0.0 oz    Comment: very rarely per pt  . Drug use: No  Comment: former marijuana use, several years  . Sexual activity: Not Currently    Birth control/protection:  Post-menopausal  Lifestyle  . Physical activity:    Days per week: Not on file    Minutes per session: Not on file  . Stress: Not on file  Relationships  . Social connections:    Talks on phone: Not on file    Gets together: Not on file    Attends religious service: Not on file    Active member of club or organization: Not on file    Attends meetings of clubs or organizations: Not on file    Relationship status: Not on file  . Intimate partner violence:    Fear of current or ex partner: Not on file    Emotionally abused: Not on file    Physically abused: Not on file    Forced sexual activity: Not on file  Other Topics Concern  . Not on file  Social History Narrative  . Not on file    Family History:   Family History  Problem Relation Age of Onset  . Heart disease Father        CAD, died age 53  . Hypertension Mother   . Dementia Mother   . Coronary artery disease Sister 69  . Hypertension Brother      ROS:  Please see the history of present illness.  All other ROS reviewed and negative.     Physical Exam/Data:   Vitals:   04/20/17 1145 04/20/17 1200 04/20/17 1215 04/20/17 1230  BP: 107/64 102/71 119/66 107/85  Pulse: (!) 37     Resp: (!) _0 Temp:      TempSrc:      SpO2: (!) 70%     Weight:      Height:       No intake or output data in the 24 hours ending 04/20/17 1255 Filed Weights   04/20/17 0723  Weight: 170 lb (77.1 kg)   Body mass index is 31.09 kg/m.  General:  Ill appearing female  in no acute distress HEENT: normal Lymph: no adenopathy Neck: no JVD Endocrine:  No thryomegaly Vascular: No carotid bruits; FA pulses 2+ bilaterally without bruits  Cardiac:  normal S1, S2; Ir IR, systolic murmur Lungs:  clear to auscultation bilaterally, no wheezing, rhonchi or rales  Abd: soft, nontender, no hepatomegaly  Ext: no edema Musculoskeletal:  No deformities, BUE and BLE strength normal and equal Skin: warm and dry  Neuro:  CNs 2-12  intact, no focal abnormalities noted Psych:  Normal affect   EKG:  The EKG was personally reviewed and demonstrates:  afib at rate of 143 bpm   Relevant CV Studies:  Echo 11/23/16 Study Conclusions  - Left ventricle: The cavity size was normal. Wall thickness was   increased in a pattern of mild LVH. Systolic function was normal.   The estimated ejection fraction was in the range of 55% to 60%.   Wall motion was normal; there were no regional wall motion   abnormalities. Doppler parameters are consistent with high   ventricular filling pressure. - Aortic valve: There was mild stenosis. - Mitral valve: Severely calcified annulus. The findings are   consistent with mild stenosis. There was mild regurgitation.   Valve area by pressure half-time: 2.02 cm^2. - Left atrium: The atrium was severely dilated. - Right atrium: The atrium was mildly dilated. - Tricuspid valve: There was severe regurgitation. - Pulmonary arteries: Systolic pressure  was severely increased. PA   peak pressure: 80 mm Hg (S). - Pericardium, extracardiac: A small pericardial effusion was   identified.  Impressions:  - Normal LV systolic function; mild LVH; calcified aortic valve   with mild AS (mean gradient 12 mmHg); severe MAC with mild MS and   mild MR; biatrial enlargment; moderate to severe TR withi   severely elevated pulmonary pressure; small pericardial effusion.   LEFT HEART CATH AND CORONARY ANGIOGRAPHY 11/22/16  Conclusion    Severe diffuse calcific coronary artery disease.  Relatively focal distal left main of 40-50% stenosis.  Ostial circumflex 75-85%, forming a [place] 101 bifurcation stenosis with the LAD and left main.  75% distal circumflex.  Large, widely patent LAD.  Ostial RCA stent that overhangs into the ascending aorta.  Selective engagement was not possible.  Severe proximal to mid disease noted with images obtained.  LV is underfilled on hand injection, LVEDP is 25 mmHg,  estimated overall EF is 50%.  Findings are compatible with chronic combined diastolic heart failure.  RECOMMENDATIONS:   In comparing images with prior study from 2013, perhaps continued medical therapy is best.  The right coronary cannot be treated percutaneously due to ostial stent overhang.  Left main/LAD/circumflex disease could be stented but would be complex and require indefinite dual antiplatelet therapy.  Not sure that she is a surgical candidate given comorbidities and continued smoking.  Heart team approach.   Laboratory Data:  Chemistry Recent Labs  Lab 04/17/17 1026 04/18/17 0803 04/20/17 0730  NA 132* 133* 137  K 4.4 4.5 4.4  CL 95* 97* 98*  CO2 _0 GLUCOSE 103* 149* 98  BUN 44* 39* 50*  CREATININE 8.29* 7.30* 7.11*  CALCIUM 8.9 9.1 9.8  GFRNONAA 4* 5* 5*  GFRAA 5* 6* 6*  ANIONGAP 15 13 16*    Recent Labs  Lab 04/17/17 1026 04/18/17 0803 04/20/17 0730  PROT  --   --  6.4*  ALBUMIN 3.1* 3.0* 3.2*  AST  --   --  17  ALT  --   --  17  ALKPHOS  --   --  58  BILITOT  --   --  0.7   Hematology Recent Labs  Lab 04/17/17 1026 04/18/17 0847 04/20/17 0730  WBC 4.6 4.9 6.1  RBC 3.54* 3.25* 3.39*  HGB 9.6* 8.8* 9.4*  HCT 29.3* 27.3* 28.9*  MCV 82.8 84.0 85.3  MCH 27.1 27.1 27.7  MCHC 32.8 32.2 32.5  RDW 18.5* 18.8* 19.7*  PLT 112* 133* 129*   Cardiac Enzymes Recent Labs  Lab 04/14/17 2359 04/15/17 0556 04/15/17 1031 04/17/17 1620 04/20/17 0730  TROPONINI 0.35* 0.61* 0.88* 0.33* 0.26*    Recent Labs  Lab 04/14/17 1433  TROPIPOC 0.26*    BNP Recent Labs  Lab 04/14/17 1533  BNP 600.3*    DDimer No results for input(s): DDIMER in the last 168 hours.  Radiology/Studies:  Dg Chest Portable 1 View  Result Date: 04/20/2017 CLINICAL DATA:  Shortness of breath, tachycardia, swelling EXAM: PORTABLE CHEST 1 VIEW COMPARISON:  04/17/2017 FINDINGS: There is bilateral interstitial thickening. There is no focal consolidation. There is no  pleural effusion or pneumothorax. There is stable cardiomegaly. There is a central venous catheter coursing from the lower abdomen with the tip terminating in the lower SVC. The osseous structures are unremarkable. IMPRESSION: Cardiomegaly with mild pulmonary vascular congestion. Electronically Signed   By: Kathreen Devoid   On: 04/20/2017 08:45   Dg Chest [facility]  1 View  Result Date: 04/17/2017 CLINICAL DATA:  72 year old female with end-stage renal disease on dialysis. Postop. Subsequent encounter. EXAM: PORTABLE CHEST 1 VIEW COMPARISON:  04/16/2017 and 11/20/2016 chest x-ray. FINDINGS: Catheter extends from below diaphragm courses over the heart with the tip projecting at the level of the distal superior vena cava just above the expected level of the cavoatrial junction. Cardiomegaly.  Mitral valve calcifications. Chronic lung changes with superimposed pulmonary vascular congestion. Calcified aorta. No pneumothorax. IMPRESSION: Catheter extends from below diaphragm courses over the heart with the tip projecting at the level of the distal superior vena cava just above the expected level of the cavoatrial junction. Cardiomegaly. Mitral valve calcifications. Chronic lung changes with superimposed pulmonary vascular congestion. Aortic Atherosclerosis (ICD10-I70.0). Electronically Signed   By: Genia Del M.D.   On: 04/17/2017 08:49   Dg Fluoro Guide Cv Line-no Report  Result Date: 04/17/2017 Fluoroscopy was utilized by the requesting physician.  No radiographic interpretation.    Assessment and Plan:   1. Persistent atrial fibrillation with RVR - She did not take her medication since discharge. Rate difficult to control due to transient hypotension.      2. Social situation - She lives alone. ? She will able to manager her medication. May consider palliative care.   3. ESRD - Will need HD today.   4. CAD - Troponin trending down from last admission. Had brief episode of chest pain this morning.  -  Continue ASA, statin and Renexa.    For questions or updates, please contact [facility] Please consult [email or web address] for contact info under Cardiology/STEMI.   Jarrett Soho, [place]  04/20/2017 12:55 PM   History and all data above reviewed.  Patient examined.  I agree with the findings as above. She did not take her meds at home last night because she was not aware that she had new meds.    The patient exam reveals [id number]  ,  Lungs: Clear  ,  Abd: Positive bowel sounds, no rebound no guarding, Ext No edema  .  All available labs, radiology testing, previous records reviewed. Agree with documented assessment and plan.   Unfortunately, this is a difficult situation.  I suspect that some of the acute decompensation may have been related to her acute problems with graft infection.   Palliative care was suggested.  The patient was a DNR during the last visit.  She did have chest pain with elevated cardiac enzymes.  She did not want another cath and I thought was very reasonable given that previously she had disease not amenable to revascularization.  The most difficult issue is that she has atrial fib with a rate that is difficult to control with low BPs precluding adequate med titration.  I had started a low dose of dig and we were giving Toprol XL but the plan was immediate release Cardizem which could be held based on BPs and held prior to dialysis.  However, she has no help at home and no BPs and did not take her meds. She did not know her meds had changed.     She has not had a therapeutic INR so cardioversion is not possible.  In addition she has had severe LAE.    I am not sure that amiodarone is a good option with severe pulmonary HTN.  I will order another echo this admission to see if the EF is still well preserved. She should have an outpatient PYP scan.  She could not have  an MRI in the past with claustrophobia.  SPEP/UPEP were drawn.  For now I would continue with the Toprol XL   and change the timing to night and given the Cardizem with plans to hold if SBP less than 90.  Currently on IV.  Discontinue after PO Toprol given.        Jeneen Rinks Barre [place]delott  2:07 PM  04/20/2017

## 2017-04-21 DIAGNOSIS — I4891 Unspecified atrial fibrillation: Secondary | ICD-10-CM

## 2017-04-21 LAB — CULTURE, BLOOD (ROUTINE X 2)
CULTURE: NO GROWTH
Culture: NO GROWTH
SPECIAL REQUESTS: ADEQUATE
SPECIAL REQUESTS: ADEQUATE

## 2017-04-21 LAB — PROTIME-INR
INR: 1.45
PROTHROMBIN TIME: 17.5 s — AB (ref 11.4–15.2)

## 2017-04-21 LAB — CBC
HCT: 27.3 % — ABNORMAL LOW (ref 36.0–46.0)
Hemoglobin: 9.3 g/dL — ABNORMAL LOW (ref 12.0–15.0)
MCH: 28.5 pg (ref 26.0–34.0)
MCHC: 34.1 g/dL (ref 30.0–36.0)
MCV: 83.7 fL (ref 78.0–100.0)
PLATELETS: 118 10*3/uL — AB (ref 150–400)
RBC: 3.26 MIL/uL — AB (ref 3.87–5.11)
RDW: 19.8 % — AB (ref 11.5–15.5)
WBC: 6.3 10*3/uL (ref 4.0–10.5)

## 2017-04-21 LAB — BASIC METABOLIC PANEL
Anion gap: 11 (ref 5–15)
BUN: 26 mg/dL — AB (ref 6–20)
CALCIUM: 9.2 mg/dL (ref 8.9–10.3)
CHLORIDE: 101 mmol/L (ref 101–111)
CO2: 25 mmol/L (ref 22–32)
CREATININE: 4.05 mg/dL — AB (ref 0.44–1.00)
GFR calc non Af Amer: 10 mL/min — ABNORMAL LOW (ref >60)
GFR, EST AFRICAN AMERICAN: 12 mL/min — AB (ref >60)
Glucose, Bld: 108 mg/dL — ABNORMAL HIGH (ref 65–99)
Potassium: 3.4 mmol/L — ABNORMAL LOW (ref 3.5–5.1)
SODIUM: 137 mmol/L (ref 135–145)

## 2017-04-21 LAB — TROPONIN I: Troponin I: 0.25 ng/mL (ref <0.03)

## 2017-04-21 MED ORDER — HYDROCODONE-ACETAMINOPHEN 5-325 MG PO TABS: 1.0000 | ORAL_TABLET | Freq: Once | ORAL | Status: DC

## 2017-04-21 MED ORDER — EPOETIN ALFA NICU SYRINGE 2000 UNITS/ML: 200.0000 [IU]/kg | INTRAMUSCULAR | Status: DC

## 2017-04-21 MED ORDER — DARBEPOETIN ALFA 60 MCG/0.3ML IJ SOSY: 60.0000 ug | PREFILLED_SYRINGE | INTRAMUSCULAR | Status: DC

## 2017-04-21 MED ORDER — WARFARIN SODIUM 7.5 MG PO TABS
7.5000 mg | ORAL_TABLET | Freq: Once | ORAL | Status: AC
  Administered 2017-04-21: 7.5 mg via ORAL
  Filled 2017-04-21: qty 1

## 2017-04-21 NOTE — Progress Notes (Signed)
Patient c/o chest pain, 3 SL Nitro's given and Vicodin per patient's request. VSS, Dr.Blount notified at [place] after all meds given and patient still c/o chest pain 5/10. Awaiting Troponin results drawn at 0000.

## 2017-04-21 NOTE — Progress Notes (Signed)
Progress Note  Patient Name: [name] Date of Encounter: 04/21/2017  Primary Cardiologist: Dr. Minus Breeding  Subjective   Feels less short of breath, no chest pain.  No palpitations.  Inpatient Medications    Scheduled Meds: . ambrisentan  5 mg Oral Daily  . aspirin EC  81 mg Oral Daily  . cilostazol  100 mg Oral BID AC  . [START ON 04/23/2017] cinacalcet  90 mg Oral Q M,W,F-HD  . [START ON 04/23/2017] Darbepoetin Alfa  60 mcg Subcutaneous Q Mon-HD  . digoxin  0.125 mg Oral Once per day on Mon Wed Fri  . diltiazem  60 mg Oral TID  . diphenhydrAMINE  50 mg Oral Q M,W,F  . HYDROcodone-acetaminophen  1 tablet Oral Once  . isosorbide dinitrate  20 mg Oral TID  . levETIRAcetam  500 mg Oral Q2000  . levothyroxine  150 mcg Oral QAC breakfast  . metoprolol succinate  100 mg Oral Daily  . multivitamin  1 tablet Oral QHS  . pantoprazole  40 mg Oral QHS  . pravastatin  40 mg Oral QHS  . ranolazine  500 mg Oral BID  . sevelamer carbonate  1,600 mg Oral TID WC  . sodium chloride flush  3 mL Intravenous Q12H  . warfarin  7.5 mg Oral ONCE-1800  . Warfarin - Pharmacist Dosing Inpatient   Does not apply q1800   Continuous Infusions: . sodium chloride    . sodium chloride    . sodium chloride     PRN Meds: sodium chloride, sodium chloride, sodium chloride, acetaminophen, albuterol, alteplase, HYDROcodone-acetaminophen, lidocaine (PF), lidocaine-prilocaine, nitroGLYCERIN, ondansetron (ZOFRAN) IV, pentafluoroprop-tetrafluoroeth, senna-docusate, sodium chloride flush, traZODone   Vital Signs    Vitals:   04/21/17 0809 04/21/17 0925 04/21/17 0930 04/21/17 1245  BP: 112/71 112/71 112/71 (!) 87/57  Pulse: (!) 109  (!) 109 (!) 107  Resp: 19   17  Temp: 97.8 F (36.6 C)   97.9 F (36.6 C)  TempSrc: Oral   Oral  SpO2: 94%   97%  Weight:      Height:        Intake/Output Summary (Last 24 hours) at 04/21/2017 1424 Last data filed at 04/21/2017 0931 Gross per 24 hour  Intake  546 ml  Output 3000 ml  Net -2454 ml   Filed Weights   04/20/17 1925 04/20/17 2342 04/21/17 0557  Weight: 183 lb 13.8 oz (83.4 kg) 177 lb 4 oz (80.4 kg) 173 lb 15.1 oz (78.9 kg)    Telemetry    Rate controlled atrial fibrillation.  Personally reviewed.  Physical Exam   GEN: No acute distress.   Neck: No JVD. Cardiac:  Irregular regular, 2/6 systolic murmur.  Respiratory: Nonlabored. No wheezing. GI: Soft, nontender, bowel sounds present. MS:  Left upper extremity AV graft. Neuro:  Nonfocal. Psych: Alert and oriented x 3. Normal affect.  Labs    Chemistry Recent Labs  Lab 04/17/17 1026 04/18/17 0803 04/20/17 0730 04/21/17 0014  NA 132* 133* 137 137  K 4.4 4.5 4.4 3.4*  CL 95* 97* 98* 101  CO2 _0 GLUCOSE 103* 149* 98 108*  BUN 44* 39* 50* 26*  CREATININE 8.29* 7.30* 7.11* 4.05*  CALCIUM 8.9 9.1 9.8 9.2  PROT  --   --  6.4*  --   ALBUMIN 3.1* 3.0* 3.2*  --   AST  --   --  17  --   ALT  --   --  17  --  ALKPHOS  --   --  58  --   BILITOT  --   --  0.7  --   GFRNONAA 4* 5* 5* 10*  GFRAA 5* 6* 6* 12*  ANIONGAP 15 13 16* 11     Hematology Recent Labs  Lab 04/18/17 0847 04/20/17 0730 04/21/17 0014  WBC 4.9 6.1 6.3  RBC 3.25* 3.39* 3.26*  HGB 8.8* 9.4* 9.3*  HCT 27.3* 28.9* 27.3*  MCV 84.0 85.3 83.7  MCH 27.1 27.7 28.5  MCHC 32.2 32.5 34.1  RDW 18.8* 19.7* 19.8*  PLT 133* 129* 118*    Cardiac Enzymes Recent Labs  Lab 04/20/17 0730 04/20/17 1351 04/20/17 1824 04/21/17 0014  TROPONINI 0.26* 0.27* 0.31* 0.25*    Recent Labs  Lab 04/14/17 1433  TROPIPOC 0.26*     BNP Recent Labs  Lab 04/14/17 1533  BNP 600.3*     Radiology    Dg Chest Portable 1 View  Result Date: 04/20/2017 CLINICAL DATA:  Shortness of breath, tachycardia, swelling EXAM: PORTABLE CHEST 1 VIEW COMPARISON:  04/17/2017 FINDINGS: There is bilateral interstitial thickening. There is no focal consolidation. There is no pleural effusion or pneumothorax. There is  stable cardiomegaly. There is a central venous catheter coursing from the lower abdomen with the tip terminating in the lower SVC. The osseous structures are unremarkable. IMPRESSION: Cardiomegaly with mild pulmonary vascular congestion. Electronically Signed   By: Kathreen Devoid   On: 04/20/2017 08:45    Cardiac Studies   Echocardiogram 11/23/2016: Study Conclusions  - Left ventricle: The cavity size was normal. Wall thickness was   increased in a pattern of mild LVH. Systolic function was normal.   The estimated ejection fraction was in the range of 55% to 60%.   Wall motion was normal; there were no regional wall motion   abnormalities. Doppler parameters are consistent with high   ventricular filling pressure. - Aortic valve: There was mild stenosis. - Mitral valve: Severely calcified annulus. The findings are   consistent with mild stenosis. There was mild regurgitation.   Valve area by pressure half-time: 2.02 cm^2. - Left atrium: The atrium was severely dilated. - Right atrium: The atrium was mildly dilated. - Tricuspid valve: There was severe regurgitation. - Pulmonary arteries: Systolic pressure was severely increased. PA   peak pressure: 80 mm Hg (S). - Pericardium, extracardiac: A small pericardial effusion was   identified.  Impressions:  - Normal LV systolic function; mild LVH; calcified aortic valve   with mild AS (mean gradient 12 mmHg); severe MAC with mild MS and   mild MR; biatrial enlargment; moderate to severe TR withi   severely elevated pulmonary pressure; small pericardial effusion.  Patient Profile     72 y.o. female with ESRD on HD, pulmonary HTN, essential HTN, HLD, CAD w/ previous RCA stenting, OSA and atrial fibrillation on warfarin.  Assessment & Plan    1.  Persistent atrial fibrillation presenting with RVR in the setting of inconsistent medication use. Relative hypotension has limited therapy as well. Heart rate currently better on oral cardizem,  lanoxin, and Toprol XL. On Coumadin for stroke prophylaxis.  2. Elevated troponin I in flat pattern suggestive of demand ischemia rather than ACS.  3. ESRD on HD.  4. Recent blood loss anemia requiring PRBCs.  5. Known CAD with plan for medical management.  Continue current oral heart rate control regimen. Coumadin per pharmacy.  Signed, Rozann Lesches, MD  04/21/2017, 2:24 PM

## 2017-04-21 NOTE — Progress Notes (Signed)
ANTICOAGULATION CONSULT NOTE - [place] for warfarin Indication: atrial fibrillation  Allergies  Allergen Reactions  . Ace Inhibitors Anaphylaxis and Rash  . Heparin Other (See Comments)    MDs told her not to take after reaction in ICU. Tolerates Lovenox fine  . Iohexol Swelling and Other (See Comments)    1970s; passed out and had facial/tongue swelling.  Requires 13-hour prep with prednisone and benadryl  . Phenytoin Other (See Comments)    Had reaction while in ICU; doesn't know.  MDs told her not to take ever again.  . Wellbutrin [Bupropion] Other (See Comments)    seizures  . Meperidine Hcl Swelling and Rash    Makes tongue swell  . Morphine Rash  . Penicillins Hives and Rash    Has patient had a PCN reaction causing immediate rash, facial/tongue/throat swelling, SOB or lightheadedness with hypotension: Yes Has patient had a PCN reaction causing severe rash involving mucus membranes or skin necrosis: No Has patient had a PCN reaction that required hospitalization: No Has patient had a PCN reaction occurring within the last 10 years: No If all of the above answers are "NO", then may proceed with Cephalosporin use.    . Valproic Acid And Related Other (See Comments)    Confusion   . Iodinated Diagnostic Agents Other (See Comments)    Pre-meditate with benadryl and prednisone 3 times before appt.  . Pentazocine Lactate Other (See Comments)    Patient does not remember reaction to this med ([place]).     Patient Measurements: Height: 5\' 2"  (157.5 cm) Weight: 173 lb 15.1 oz (78.9 kg) IBW/kg (Calculated) : 50.1   Vital Signs: Temp: 97.8 F (36.6 C) (04/20 0809) Temp Source: Oral (04/20 0809) BP: 112/71 (04/20 0930) Pulse Rate: 109 (04/20 0930)  Labs: Recent Labs    04/19/17 0958  04/20/17 0730 04/20/17 1351 04/20/17 1824 04/21/17 0014  HGB  --   --  9.4*  --   --  9.3*  HCT  --   --  28.9*  --   --  27.3*  PLT  --   --  129*  --    --  118*  LABPROT 16.2*  --  15.7*  --   --  17.5*  INR 1.32  --  1.26  --   --  1.45  CREATININE  --   --  7.11*  --   --  4.05*  TROPONINI  --    < > 0.26* 0.27* 0.31* 0.25*   < > = values in this interval not displayed.    Estimated Creatinine Clearance: 12.2 mL/min (A) (by C-G formula based on SCr of 4.05 mg/dL (H)).   Medical History: Past Medical History:  Diagnosis Date  . Anemia    History  of Blood transfusion  . Asthma    PMH  . Atrial fibrillation with RVR ([place]) 11/2016  . CAD (coronary artery disease)    stent to RCA  . Cancer ([place])    clear cell cancer, kidney  . Complication of anesthesia 12/2010   pt is very confused, with AMS with anesthesia  . CVA (cerebral infarction) 2003   no apparent residual  . Dyslipidemia   . Encephalopathy   . ESRD 07/27/2008   ESRD due to HTN and NSAID's, started hemodialysis in 2005 in [place], [place]. Went to [facility] from 2010 to 2012 and since 2012 has been getting dialysis at [facility] on [address] in [place] on a  MWF schedule. First access with RUA AVG placed in [place]. Next and current access was LUA AVG placed by Dr. Lucky Cowboy in [place] in or around 2012. Has had 2 or 3 procedures on that graft since placed per family. She gets her access work done here in [place] now. She is allergic to heparin and does not get any heparin at dialysis; she had an allergic reaction apparently when in ICU in the past.      . GERD (gastroesophageal reflux disease)   . Hyperlipidemia   . Hypertension   . Hypothyroidism   . Myocardial infarction ([place])    per pt  . Positive PPD    completed rifampin  . Pulmonary hypertension ([place])   . PVD (peripheral vascular disease) ([place])   . Seizures ([place])    last seizure 6 years ago  . Sleep apnea    Sleep Study 2008  . Traumatic seroma of left lower leg ([place])   . Wears partial dentures      Assessment: 72 yo female with AFib and reported non-compliance with warfarin since discharge  4/10. INR 1.2 on admit now 1.45, CBC stable.  Goal of Therapy:  INR 2-3 Monitor platelets by anticoagulation protocol: Yes  Plan:  -Warfarin 7.5mg  PO x1 tonight -Daily INR  Arrie Senate, PharmD, BCPS PGY-2 Cardiology Pharmacy Resident Pager: [phone] 04/21/2017

## 2017-04-21 NOTE — Evaluation (Signed)
Physical Therapy Evaluation Patient Details Name: [name] MRN: [id number] DOB: 02/18/1945 Today's Date: 04/21/2017   History of Present Illness  Pt is a 72 y/o female with medical history significant for end-stage renal disease on dialysis, hypertension, pulmonary hypertension, hyperlipidemia, CAD with previous RCA stenting, obstructive sleep apnea and atrial fibrillation on Coumadin presents to the emergency department with the chief complaint of chest pain. Initial evaluation reveals atrial fibrillation with rapid ventricular response.    Clinical Impression  Pt presented supine in bed with HOB elevated, awake and willing to participate in therapy session. Pt's daughter and granddaughter present throughout session as well. Prior to admission, pt reported that she uses a SPC to ambulate PRN and is independent with ADLs. Pt currently limited secondary to fatigue. She tolerated bed mobility with supervision, transfers with min guard and ambulated a short distance (~50') with no AD with constant min guard. Pt required min A x1 secondary to LOB. Pt also required one standing rest break secondary to fatigue. All VSS throughout. Pt would continue to benefit from skilled physical therapy services at this time while admitted and after d/c to address the below listed limitations in order to improve overall safety and independence with functional mobility.     Follow Up Recommendations Home health PT;Supervision/Assistance - 24 hour    Equipment Recommendations  None recommended by PT    Recommendations for Other Services       Precautions / Restrictions Precautions Precautions: Fall Restrictions Weight Bearing Restrictions: No      Mobility  Bed Mobility Overal bed mobility: Needs Assistance Bed Mobility: Supine to Sit;Sit to Supine     Supine to sit: Supervision Sit to supine: Supervision   General bed mobility comments: increased time  Transfers Overall transfer level: Needs  assistance Equipment used: None Transfers: Sit to/from Stand Sit to Stand: Min guard         General transfer comment: for safety  Ambulation/Gait Ambulation/Gait assistance: Min guard;Min assist Ambulation Distance (Feet): 50 Feet Assistive device: None;1 person hand held assist Gait Pattern/deviations: Step-through pattern;Decreased step length - right;Decreased step length - left;Decreased stride length;Shuffle Gait velocity: decreased Gait velocity interpretation: <1.8 ft/sec, indicate of risk for recurrent falls General Gait Details: pt with modest instability requiring constant close min guard and with one LOB requiring min A to recover. Pt not using an AD but reaching for hand rails in hallway frequently. Pt required one standing rest break secondary to fatigue  Stairs            Wheelchair Mobility    Modified Rankin (Stroke Patients Only)       Balance Overall balance assessment: Needs assistance Sitting-balance support: Feet supported Sitting balance-Leahy Scale: Fair     Standing balance support: During functional activity;No upper extremity supported Standing balance-Leahy Scale: Poor                               Pertinent Vitals/Pain Pain Assessment: No/denies pain    Home Living Family/patient expects to be discharged to:: Private residence Living Arrangements: Alone Available Help at Discharge: Family;Friend(s);Available PRN/intermittently;Neighbor Type of Home: Apartment Home Access: Level entry     Home Layout: One level Home Equipment: [place] - 2 wheels;Cane - single point;Shower seat      Prior Function Level of Independence: Independent with assistive device(s)         Comments: pt ambulates with use of SPC PRN  Hand Dominance        Extremity/Trunk Assessment   Upper Extremity Assessment Upper Extremity Assessment: Overall WFL for tasks assessed    Lower Extremity Assessment Lower Extremity Assessment:  Overall WFL for tasks assessed       Communication   Communication: No difficulties  Cognition Arousal/Alertness: Awake/alert Behavior During Therapy: WFL for tasks assessed/performed Overall Cognitive Status: Within Functional Limits for tasks assessed                                        General Comments      Exercises     Assessment/Plan    PT Assessment Patient needs continued PT services  PT Problem List Decreased strength;Decreased activity tolerance;Decreased balance;Decreased mobility;Decreased coordination;Decreased knowledge of use of DME;Decreased safety awareness;Decreased knowledge of precautions;Cardiopulmonary status limiting activity       PT Treatment Interventions DME instruction;Gait training;Stair training;Functional mobility training;Therapeutic exercise;Therapeutic activities;Balance training;Neuromuscular re-education;Patient/family education    PT Goals (Current goals can be found in the Care Plan section)  Acute Rehab PT Goals Patient Stated Goal: return home PT Goal Formulation: With patient Time For Goal Achievement: 05/05/17 Potential to Achieve Goals: Good    Frequency Min 3X/week   Barriers to discharge        Co-evaluation               AM-PAC PT "6 Clicks" Daily Activity  Outcome Measure Difficulty turning over in bed (including adjusting bedclothes, sheets and blankets)?: None Difficulty moving from lying on back to sitting on the side of the bed? : None Difficulty sitting down on and standing up from a chair with arms (e.g., wheelchair, bedside commode, etc,.)?: A Little Help needed moving to and from a bed to chair (including a wheelchair)?: A Little Help needed walking in hospital room?: A Little Help needed climbing 3-5 steps with a railing? : A Lot 6 Click Score: 19    End of Session Equipment Utilized During Treatment: Gait belt Activity Tolerance: Patient limited by fatigue Patient left: in bed;with  call bell/phone within reach;with family/visitor present Nurse Communication: Mobility status PT Visit Diagnosis: Other abnormalities of gait and mobility (R26.89);Muscle weakness (generalized) (M62.81)    Time: 1535-1550 PT Time Calculation (min) (ACUTE ONLY): 15 min   Charges:   PT Evaluation $PT Eval Moderate Complexity: 1 Mod     PT G Codes:        [place], PT, DPT [place] 04/21/2017, 5:29 PM

## 2017-04-21 NOTE — Progress Notes (Signed)
Md notified for parameters for isordil.  Pt's sbp in 90's.  Will continue to monitor. Saunders Revel T

## 2017-04-21 NOTE — Progress Notes (Signed)
PROGRESS NOTE    [name]  GEX:[id number] DOB: 1945-05-07 DOA: 04/20/2017 PCP: Glendale Chard, MD Brief Narrative: 72 y.o. female with medical history significant for end-stage renal disease on dialysis, hypertension, pulmonary hypertension, hyperlipidemia, CAD with previous RCA stenting, obstructive sleep apnea and atrial fibrillation on Coumadin presents to the emergency department with the chief complaint of chest pain. Initial evaluation reveals atrial fibrillation with rapid ventricular response. [facility] are asked to admit  Information is obtained from the patient and the chart. She reports she was discharged yesterday after a five-day hospitalization for atrial fib with RVR, elevated troponin without acute EKG changes, anemia, infection and dialysis catheter. She reports she at home yesterday afternoon went to bed. She did not feel well as she believes "I should not have come home". She reports she went to dialysis center today they referred her to the emergency department for a heart rate range 80-140. She denies headache dizziness syncope or near-syncope. She does endorse some intermittent chest pain and palpitations that she says are chronic. She denies shortness of breath diaphoresis nausea vomiting diarrhea. She denies any lower extremity edema cough fever chills. She reports she did not take her medication today she usually does this after dialysis. ED Course: in the emergency department she's afebrile with a heart rate in 147 is not hypoxic she is provided with Cardizem drip. At the time of admission rate 64 blood pressure 120/61     Assessment & Plan:   Principal Problem:   Atrial fibrillation with RVR (HCC) Active Problems:   Essential hypertension, malignant   Coronary atherosclerosis   Chronic diastolic heart failure (HCC)   ESRD   Depression   GERD (gastroesophageal reflux disease)   Epilepsy (HCC)   Dyslipidemia   Elevated troponin   A-fib (HCC)  1]  A. fib with RVR recurrent secondary to noncompliance to medications.  Patient was discharged from the hospital for 18 and was readmitted from dialysis center with shortness of breath and elevated heart rate.  He was started on Cardizem drip in the ER.  Patient reported that she did not know that she had new medications to take at home.  It is noted that her heart rate was difficult to control even during the last admission due to soft blood pressure.  She is currently on digoxin 0.125 mg 3 times a week, Cardizem 60 mg 3 times daily, Isordil 20 mg 3 times a day, Toprol 100 mg daily, Ranexa 500 mg twice a day aspirin and Coumadin.  Patient also has a history of pulmonary hypertension for which she takes ambrisentan.  Will place consult to social services and home health care upon discharge and will discuss with patient's daughter to see if we can maximize her compliance to medications.  Discussed with daughter [name] who is planning to move in with her mom June 2019. however she would like some kind of help home health social services to assist with managing her medications at home.  I will also put in a PT and OT evaluation.  2] end-stage renal disease dialysis Monday Wednesday and Friday.  She was unable to finish dialysis on the day of admission yesterday.  Nephrology following.  Patient had infected TDC right thigh which was removed 414 and a new TDC has been placed in the left thigh.  She got her last dose of antibiotics vancomycin during dialysis yesterday.  3] anemia status post blood transfusion last admission.  hemoglobin 9.3 today stable.  4] history of hypertension  but with soft blood pressure.  5] hypothyroidism continue Synthroid.  6] seizure disorder continue Keppra.    DVT prophylaxis: On Coumadin  code Status:  DO NOT RESUSCITATE discussed with patient and daughter. Family Communication: Discussed with daughter on the phone Disposition Plan: DC home once medically stable hopefully early  next week.  Consultants: Cardiology, nephrology  Procedures: None Antimicrobials: None  Subjective: Resting in bed in no acute distress.  Objective: She reports that she feels better than yesterday.  She denies any chest pain or shortness of breath at this time. Vitals:   04/21/17 0557 04/21/17 0809 04/21/17 0925 04/21/17 0930  BP:   112/71 112/71  Pulse:    (!) 109  Resp:      Temp:  97.8 F (36.6 C)    TempSrc:  Oral    SpO2:      Weight: 78.9 kg (173 lb 15.1 oz)     Height:        Intake/Output Summary (Last 24 hours) at 04/21/2017 1008 Last data filed at 04/21/2017 0931 Gross per 24 hour  Intake 246 ml  Output 3000 ml  Net -2754 ml   Filed Weights   04/20/17 1925 04/20/17 2342 04/21/17 0557  Weight: 83.4 kg (183 lb 13.8 oz) 80.4 kg (177 lb 4 oz) 78.9 kg (173 lb 15.1 oz)    Examination:  General exam: Appears calm and comfortable  Respiratory system: few rhonchi  to auscultation. Respiratory effort normal. Cardiovascular system: S1 & S2 heard,irreg irreg. No JVD, murmurs, rubs, gallops or clicks. No pedal edema. Gastrointestinal system: Abdomen is nondistended, soft and nontender. No organomegaly or masses felt. Normal bowel sounds heard. Central nervous system: Alert and oriented. No focal neurological deficits. Extremities: Symmetric 5 x 5 power. Skin: No rashes, lesions or ulcers Psychiatry: Judgement and insight appear normal. Mood & affect appropriate.     Data Reviewed: I have personally reviewed following labs and imaging studies  CBC: Recent Labs  Lab 04/14/17 2359  04/17/17 0559 04/17/17 1026 04/18/17 0847 04/20/17 0730 04/21/17 0014  WBC 4.4   < > 4.4 4.6 4.9 6.1 6.3  NEUTROABS 2.8  --  2.5  --   --  4.0  --   HGB 10.4*   < > 9.4* 9.6* 8.8* 9.4* 9.3*  HCT 31.4*   < > 29.5* 29.3* 27.3* 28.9* 27.3*  MCV 83.5   < > 83.8 82.8 84.0 85.3 83.7  PLT 122*   < > 144* 112* 133* 129* 118*   < > = values in this interval not displayed.   Basic  Metabolic Panel: Recent Labs  Lab 04/15/17 1031 04/17/17 0559 04/17/17 1026 04/18/17 0803 04/20/17 0730 04/21/17 0014  NA  --  134* 132* 133* 137 137  K  --  4.5 4.4 4.5 4.4 3.4*  CL  --  97* 95* 97* 98* 101  CO2  --  21* 22 23 23 25   GLUCOSE  --  86 103* 149* 98 108*  BUN  --  45* 44* 39* 50* 26*  CREATININE  --  8.33* 8.29* 7.30* 7.11* 4.05*  CALCIUM  --  9.0 8.9 9.1 9.8 9.2  PHOS 4.5 6.1* 6.2* 4.9*  --   --    GFR: Estimated Creatinine Clearance: 12.2 mL/min (A) (by C-G formula based on SCr of 4.05 mg/dL (H)). Liver Function Tests: Recent Labs  Lab 04/17/17 0559 04/17/17 1026 04/18/17 0803 04/20/17 0730  AST  --   --   --  17  ALT  --   --   --  17  ALKPHOS  --   --   --  58  BILITOT  --   --   --  0.7  PROT  --   --   --  6.4*  ALBUMIN 3.2* 3.1* 3.0* 3.2*   No results for input(s): LIPASE, AMYLASE in the last 168 hours. No results for input(s): AMMONIA in the last 168 hours. Coagulation Profile: Recent Labs  Lab 04/15/17 0556 04/18/17 0803 04/19/17 0958 04/20/17 0730 04/21/17 0014  INR 1.61 1.79 1.32 1.26 1.45   Cardiac Enzymes: Recent Labs  Lab 04/17/17 1620 04/20/17 0730 04/20/17 1351 04/20/17 1824 04/21/17 0014  TROPONINI 0.33* 0.26* 0.27* 0.31* 0.25*   BNP (last 3 results) No results for input(s): PROBNP in the last 8760 hours. HbA1C: No results for input(s): HGBA1C in the last 72 hours. CBG: No results for input(s): GLUCAP in the last 168 hours. Lipid Profile: No results for input(s): CHOL, HDL, LDLCALC, TRIG, CHOLHDL, LDLDIRECT in the last 72 hours. Thyroid Function Tests: No results for input(s): TSH, T4TOTAL, FREET4, T3FREE, THYROIDAB in the last 72 hours. Anemia Panel: No results for input(s): VITAMINB12, FOLATE, FERRITIN, TIBC, IRON, RETICCTPCT in the last 72 hours. Sepsis Labs: No results for input(s): PROCALCITON, LATICACIDVEN in the last 168 hours.  Recent Results (from the past 240 hour(s))  Aerobic Culture (superficial  specimen)     Status: None (Preliminary result)   Collection Time: 04/14/17  6:45 PM  Result Value Ref Range Status   Specimen Description WOUND RIGHT LEG  Final   Special Requests Immunocompromised  Final   Gram Stain   Final    RARE WBC PRESENT, PREDOMINANTLY PMN NO ORGANISMS SEEN    Culture   Final    RARE MOLD ISOLATE REFERRED FOR ID ONLY Performed at [facility], [address], [place], [place] [address]    Report Status PENDING  Incomplete  Culture, blood (routine x 2)     Status: None   Collection Time: 04/14/17  7:15 PM  Result Value Ref Range Status   Specimen Description BLOOD HEMODIALYSIS CATHETER  Final   Special Requests   Final    BOTTLES DRAWN AEROBIC AND ANAEROBIC Blood Culture results may not be optimal due to an excessive volume of blood received in culture bottles   Culture   Final    NO GROWTH 5 DAYS Performed at [facility], [place] [address], [place], [place] [address]    Report Status 04/19/2017 FINAL  Final  Culture, blood (routine x 2)     Status: None   Collection Time: 04/14/17  7:20 PM  Result Value Ref Range Status   Specimen Description BLOOD HEMODIALYSIS CATHETER  Final   Special Requests   Final    BOTTLES DRAWN AEROBIC AND ANAEROBIC Blood Culture results may not be optimal due to an excessive volume of blood received in culture bottles   Culture   Final    NO GROWTH 5 DAYS Performed at [facility], [place] [address], [place], [place] [address]    Report Status 04/19/2017 FINAL  Final  MRSA PCR Screening     Status: None   Collection Time: 04/15/17  1:06 AM  Result Value Ref Range Status   MRSA by PCR NEGATIVE NEGATIVE Final    Comment:        The GeneXpert MRSA Assay (FDA approved for NASAL specimens only), is one component of a comprehensive MRSA  colonization surveillance program. It is not intended to diagnose MRSA infection nor to guide or monitor treatment for MRSA infections. Performed at [facility],  [place] [address], [place], [place] [address]   Culture, blood (Routine X 2) w Reflex to ID Panel     Status: None (Preliminary result)   Collection Time: 04/16/17 10:14 AM  Result Value Ref Range Status   Specimen Description BLOOD RIGHT ARM  Final   Special Requests   Final    BOTTLES DRAWN AEROBIC AND ANAEROBIC Blood Culture adequate volume   Culture   Final    NO GROWTH 4 DAYS Performed at [facility], [place] [address], [place], [place] [address]    Report Status PENDING  Incomplete  Culture, blood (Routine X 2) w Reflex to ID Panel     Status: None (Preliminary result)   Collection Time: 04/16/17 10:21 AM  Result Value Ref Range Status   Specimen Description BLOOD RIGHT HAND  Final   Special Requests   Final    BOTTLES DRAWN AEROBIC AND ANAEROBIC Blood Culture adequate volume   Culture   Final    NO GROWTH 4 DAYS Performed at [facility], [place] [address], [place], [place] [address]    Report Status PENDING  Incomplete  Surgical pcr screen     Status: None   Collection Time: 04/17/17  2:22 AM  Result Value Ref Range Status   MRSA, PCR NEGATIVE NEGATIVE Final   Staphylococcus aureus NEGATIVE NEGATIVE Final    Comment: (NOTE) The Xpert SA Assay (FDA approved for NASAL specimens in patients 64 years of age and older), is one component of a comprehensive surveillance program. It is not intended to diagnose infection nor to guide or monitor treatment. Performed at [facility], [place] [address], [place], [place] [address]   MRSA PCR Screening     Status: None   Collection Time: 04/20/17  4:39 PM  Result Value Ref Range Status   MRSA by PCR NEGATIVE NEGATIVE Final    Comment:        The GeneXpert MRSA Assay (FDA approved for NASAL specimens only), is one component of a comprehensive MRSA colonization surveillance program. It is not intended to diagnose MRSA infection nor to guide or monitor treatment for MRSA infections. Performed at [facility],  [place] [address] [place], [place] [address]          Radiology Studies: Dg Chest Portable 1 View  Result Date: 04/20/2017 CLINICAL DATA:  Shortness of breath, tachycardia, swelling EXAM: PORTABLE CHEST 1 VIEW COMPARISON:  04/17/2017 FINDINGS: There is bilateral interstitial thickening. There is no focal consolidation. There is no pleural effusion or pneumothorax. There is stable cardiomegaly. There is a central venous catheter coursing from the lower abdomen with the tip terminating in the lower SVC. The osseous structures are unremarkable. IMPRESSION: Cardiomegaly with mild pulmonary vascular congestion. Electronically Signed   By: Kathreen Devoid   On: 04/20/2017 08:45        Scheduled Meds: . ambrisentan  5 mg Oral Daily  . aspirin EC  81 mg Oral Daily  . cilostazol  100 mg Oral BID AC  . [START ON 04/23/2017] cinacalcet  90 mg Oral Q M,W,F-HD  . digoxin  0.125 mg Oral Once per day on Mon Wed Fri  . diltiazem  60 mg Oral TID  . diphenhydrAMINE  50 mg Oral Q M,W,F  . HYDROcodone-acetaminophen  1 tablet Oral Once  .  isosorbide dinitrate  20 mg Oral TID  . levETIRAcetam  500 mg Oral Q2000  . levothyroxine  150 mcg Oral QAC breakfast  . metoprolol succinate  100 mg Oral Daily  . multivitamin  1 tablet Oral QHS  . pantoprazole  40 mg Oral QHS  . pravastatin  40 mg Oral QHS  . ranolazine  500 mg Oral BID  . sevelamer carbonate  1,600 mg Oral TID WC  . sodium chloride flush  3 mL Intravenous Q12H  . Warfarin - Pharmacist Dosing Inpatient   Does not apply q1800   Continuous Infusions: . sodium chloride    . sodium chloride    . sodium chloride       LOS: 1 day     Georgette Shell, MD [facility] If 7PM-7AM, please contact night-coverage [email or web address] Password [email or web address] 04/21/2017, 10:08 AM

## 2017-04-21 NOTE — Progress Notes (Addendum)
[facility] Progress Note   Subjective:  HD last night with net UF 3L. Tolerated well, but had CP after. Improved with nitro. Denies CP, SOB this morning.    Objective Vitals:   04/21/17 0557 04/21/17 0809 04/21/17 0925 04/21/17 0930  BP:  112/71 112/71 112/71  Pulse:  (!) 109  (!) 109  Resp:  19    Temp:  97.8 F (36.6 C)    TempSrc:  Oral    SpO2:  94%    Weight: 78.9 kg (173 lb 15.1 oz)     Height:       Physical Exam General:NAD, WDWN female, sitting in bed Heart:regular rate, irregular rhythm  Lungs:CTAB Abdomen:soft, NTND Extremities:no LE edema Dialysis Access: L thigh TDC dsg intact no drainage, LU AVG +b/t    CXR 4/19 - negative, no edema  Dialysis Orders:  MWF [facility] 75kg 2/2 bath P4  TDC - was R thigh, now L thigh after removal / replacement -maturing LUA AVG Hep none (allergic) -Epogen 9600 Units IV qHD - Weekly dose 29528 -Sensipar 120mg  PO qHD -Calcitriol 2.69mch PO qHD   Assessment/Plan: 1. A fib w/RVR - meds per primary. No heparin d/t allergy. Had difficulty with new medication regimen at home. Primary to arrange [facility] to help  2. Recent cath drainage - sp catheter removal/ replacement, Cx's neg, completed course vancomycin/Fortaz.  3. ESRD - HD MWF. Next HD 4/22 4. Anemia of CKD- Hgb 9.3  S/p transfusion during prior admision, started epogen 9600 Unit qHD 5. MBD ckd  - Ca/Phos at goal.  Cont Senispar, calcitriol.Renvela 2AC TID, w/snacks BID. 6. HTN - BP's on lower side partly due to afib meds  7. Nutrition - Renal diet with fluid restrictions.  8.  Seizure do - on keppra 9. Chronic CHF/CAD - per primary  10.  S/p new LUA AVG - placed on 4/4 by Dr. Bridgett Larsson, will be ready to use in about 2wks  Lynnda Child PA-C [place] Pager [phone] 04/21/2017,11:51 AM  LOS: 1 day   Pt seen, examined and agree w A/P as above.   Kelly Splinter MD [facility] pager [phone]   04/21/2017, 12:33  PM     Additional Objective Labs: Basic Metabolic Panel: Recent Labs  Lab 04/17/17 0559 04/17/17 1026 04/18/17 0803 04/20/17 0730 04/21/17 0014  NA 134* 132* 133* 137 137  K 4.5 4.4 4.5 4.4 3.4*  CL 97* 95* 97* 98* 101  CO2 21* 22 23 23 25   GLUCOSE 86 103* 149* 98 108*  BUN 45* 44* 39* 50* 26*  CREATININE 8.33* 8.29* 7.30* 7.11* 4.05*  CALCIUM 9.0 8.9 9.1 9.8 9.2  PHOS 6.1* 6.2* 4.9*  --   --    CBC: Recent Labs  Lab 04/14/17 2359  04/17/17 0559 04/17/17 1026 04/18/17 0847 04/20/17 0730 04/21/17 0014  WBC 4.4   < > 4.4 4.6 4.9 6.1 6.3  NEUTROABS 2.8  --  2.5  --   --  4.0  --   HGB 10.4*   < > 9.4* 9.6* 8.8* 9.4* 9.3*  HCT 31.4*   < > 29.5* 29.3* 27.3* 28.9* 27.3*  MCV 83.5   < > 83.8 82.8 84.0 85.3 83.7  PLT 122*   < > 144* 112* 133* 129* 118*   < > = values in this interval not displayed.   Blood Culture    Component Value Date/Time   SDES BLOOD RIGHT HAND 04/16/2017 1021   SPECREQUEST  04/16/2017 1021  BOTTLES DRAWN AEROBIC AND ANAEROBIC Blood Culture adequate volume   CULT  04/16/2017 1021    NO GROWTH 4 DAYS Performed at [facility], [place] [address], [place], [place] [address]    REPTSTATUS PENDING 04/16/2017 1021    Cardiac Enzymes: Recent Labs  Lab 04/17/17 1620 04/20/17 0730 04/20/17 1351 04/20/17 1824 04/21/17 0014  TROPONINI 0.33* 0.26* 0.27* 0.31* 0.25*   CBG: No results for input(s): GLUCAP in the last 168 hours. Iron Studies: No results for input(s): IRON, TIBC, TRANSFERRIN, FERRITIN in the last 72 hours. Lab Results  Component Value Date   INR 1.45 04/21/2017   INR 1.26 04/20/2017   INR 1.32 04/19/2017   Medications: . sodium chloride    . sodium chloride    . sodium chloride     . ambrisentan  5 mg Oral Daily  . aspirin EC  81 mg Oral Daily  . cilostazol  100 mg Oral BID AC  . [START ON 04/23/2017] cinacalcet  90 mg Oral Q M,W,F-HD  . digoxin  0.125 mg Oral Once per day on Mon Wed Fri  . diltiazem  60 mg Oral  TID  . diphenhydrAMINE  50 mg Oral Q M,W,F  . HYDROcodone-acetaminophen  1 tablet Oral Once  . isosorbide dinitrate  20 mg Oral TID  . levETIRAcetam  500 mg Oral Q2000  . levothyroxine  150 mcg Oral QAC breakfast  . metoprolol succinate  100 mg Oral Daily  . multivitamin  1 tablet Oral QHS  . pantoprazole  40 mg Oral QHS  . pravastatin  40 mg Oral QHS  . ranolazine  500 mg Oral BID  . sevelamer carbonate  1,600 mg Oral TID WC  . sodium chloride flush  3 mL Intravenous Q12H  . warfarin  7.5 mg Oral ONCE-1800  . Warfarin - Pharmacist Dosing Inpatient   Does not apply [phone]

## 2017-04-22 LAB — PROTIME-INR
INR: 1.29
Prothrombin Time: 16 seconds — ABNORMAL HIGH (ref 11.4–15.2)

## 2017-04-22 MED ORDER — POLYETHYLENE GLYCOL 3350 17 G PO PACK
17.0000 g | PACK | Freq: Every day | ORAL | Status: DC | PRN
  Administered 2017-04-22 – 2017-04-25 (×2): 17 g via ORAL
  Filled 2017-04-22 (×3): qty 1

## 2017-04-22 MED ORDER — ISOSORBIDE DINITRATE 10 MG PO TABS
10.0000 mg | ORAL_TABLET | Freq: Three times a day (TID) | ORAL | Status: DC
  Administered 2017-04-22 – 2017-04-23 (×4): 10 mg via ORAL
  Filled 2017-04-22 (×5): qty 1

## 2017-04-22 MED ORDER — WARFARIN SODIUM 10 MG PO TABS
10.0000 mg | ORAL_TABLET | Freq: Once | ORAL | Status: AC
  Administered 2017-04-22: 10 mg via ORAL
  Filled 2017-04-22: qty 1

## 2017-04-22 MED ORDER — LACTULOSE 10 GM/15ML PO SOLN
20.0000 g | Freq: Once | ORAL | Status: AC
  Administered 2017-04-24: 20 g via ORAL
  Filled 2017-04-22: qty 30

## 2017-04-22 MED ORDER — POLYETHYLENE GLYCOL 3350 17 G PO PACK: 17.0000 g | PACK | Freq: Every day | ORAL | Status: DC

## 2017-04-22 MED ORDER — BISACODYL 10 MG RE SUPP
10.0000 mg | Freq: Once | RECTAL | Status: AC
  Administered 2017-04-23: 10 mg via RECTAL

## 2017-04-22 MED ORDER — CAMPHOR-MENTHOL 0.5-0.5 % EX LOTN
TOPICAL_LOTION | CUTANEOUS | Status: DC | PRN
  Administered 2017-04-22: 14:00:00 via TOPICAL
  Filled 2017-04-22: qty 222

## 2017-04-22 NOTE — Progress Notes (Signed)
PROGRESS NOTE    [name]  PNT:[id number] DOB: 11-23-1945 DOA: 04/20/2017 PCP: Glendale Chard, MD  Brief Narrative: 72 y.o.femalewith medical history significantfor end-stage renal disease on dialysis, hypertension, pulmonary hypertension, hyperlipidemia, CAD with previous RCA stenting, obstructive sleep apnea and atrial fibrillation on Coumadin presents to the emergency department with the chief complaint of chest pain. Initial evaluation reveals atrial fibrillation with rapid ventricular response. [facility] are asked to admit  Information is obtained from the patient and the chart. She reports she was discharged yesterday after a five-day hospitalization for atrial fib with RVR, elevated troponin without acute EKG changes, anemia, infection and dialysis catheter. She reports she at home yesterday afternoon went to bed. She did not feel well as she believes "I should not have come home". She reports she went to dialysis center today they referred her to the emergency department for a heart rate range 80-140. She denies headache dizziness syncope or near-syncope. She does endorse some intermittent chest pain and palpitations that she says are chronic. She denies shortness of breath diaphoresis nausea vomiting diarrhea. She denies any lower extremity edema cough fever chills. She reports she did not take her medication today she usually does this after dialysis. ED Course:in the emergency department she's afebrile with a heart rate in 147 is not hypoxic she is provided with Cardizem drip. At the time of admission rate 64 blood pressure 120/61     Assessment & Plan:   Principal Problem:   Atrial fibrillation with RVR (HCC) Active Problems:   Essential hypertension, malignant   Coronary atherosclerosis   Chronic diastolic heart failure (HCC)   ESRD   Depression   GERD (gastroesophageal reflux disease)   Epilepsy (HCC)   Dyslipidemia   Elevated troponin   A-fib (HCC)  ]  A. fib with RVR recurrent secondary to noncompliance to medications.  Patient was discharged from the hospital for 18 and was readmitted from dialysis center with shortness of breath and elevated heart rate.  He was started on Cardizem drip in the ER.  Patient reported that she did not know that she had new medications to take at home.  It is noted that her heart rate was difficult to control even during the last admission due to soft blood pressure.  She is currently on digoxin 0.125 mg 3 times a week, Cardizem 60 mg 3 times daily, Isordil 20 mg 3 times a day, Toprol 100 mg daily, Ranexa 500 mg twice a day aspirin and Coumadin.  Patient also has a history of pulmonary hypertension for which she takes ambrisentan.  Will place consult to social services and home health care upon discharge and will discuss with patient's daughter to see if we can maximize her compliance to medications.  Discussed with daughter Denice Paradise who is planning to move in with her mom June 2019. however she would like some kind of help home health social services to assist with managing her medications at home.  I will also put in a PT and OT evaluation.?IF ISORDIL DOSE CAN BE DECREASED DUE TO SOFT BP.DEFER TO CARDS.  2] end-stage renal disease dialysis Monday Wednesday and Friday.  She was unable to finish dialysis on the day of admission yesterday.  Nephrology following.  Patient had infected TDC right thigh which was removed 414 and a new TDC has been placed in the left thigh.  She got her last dose of antibiotics vancomycin during last session.  3] anemia status post blood transfusion last admission.  hemoglobin  9.3 today stable.  4] history of hypertension but with soft blood pressure.  5] hypothyroidism continue Synthroid.  6] seizure disorder continue Keppra.  7]constipation-dulcolax      DVT prophylaxis: Coumadin Code Status: DO NOT RESUSCITATE  family Communication: No family available Disposition Plan: .   TBD   Consultants: Cardiology, nephrology  Procedures: None Antimicrobials: None  Subjective: Feels better slept well breathing better has not had a bowel movement for 2-3 days   Objective: Resting in bed in no acute distress Vitals:   04/21/17 2326 04/22/17 0300 04/22/17 0543 04/22/17 0752  BP: (!) 100/48 124/69  117/71  Pulse:  (!) 113    Resp: (!) 23 20  14   Temp:  98.3 F (36.8 C)  (!) 97.5 F (36.4 C)  TempSrc:  Oral  Oral  SpO2:  94%    Weight:   79.8 kg (175 lb 14.8 oz)   Height:        Intake/Output Summary (Last 24 hours) at 04/22/2017 0942 Last data filed at 04/22/2017 0900 Gross per 24 hour  Intake 630 ml  Output 1 ml  Net 629 ml   Filed Weights   04/20/17 2342 04/21/17 0557 04/22/17 0543  Weight: 80.4 kg (177 lb 4 oz) 78.9 kg (173 lb 15.1 oz) 79.8 kg (175 lb 14.8 oz)    Examination:  General exam: Appears calm and comfortable  Respiratory system: Clear to auscultation. Respiratory effort normal. Cardiovascular system: S1 & S2 heard, RRR. No JVD, murmurs, rubs, gallops or clicks. No pedal edema. Gastrointestinal system: Abdomen is nondistended, soft and nontender. No organomegaly or masses felt. Normal bowel sounds heard. Central nervous system: Alert and oriented. No focal neurological deficits. Extremities: Symmetric 5 x 5 power. Skin: No rashes, lesions or ulcers Psychiatry: Judgement and insight appear normal. Mood & affect appropriate.     Data Reviewed: I have personally reviewed following labs and imaging studies  CBC: Recent Labs  Lab 04/17/17 0559 04/17/17 1026 04/18/17 0847 04/20/17 0730 04/21/17 0014  WBC 4.4 4.6 4.9 6.1 6.3  NEUTROABS 2.5  --   --  4.0  --   HGB 9.4* 9.6* 8.8* 9.4* 9.3*  HCT 29.5* 29.3* 27.3* 28.9* 27.3*  MCV 83.8 82.8 84.0 85.3 83.7  PLT 144* 112* 133* 129* 355*   Basic Metabolic Panel: Recent Labs  Lab 04/15/17 1031 04/17/17 0559 04/17/17 1026 04/18/17 0803 04/20/17 0730 04/21/17 0014  NA  --  134*  132* 133* 137 137  K  --  4.5 4.4 4.5 4.4 3.4*  CL  --  97* 95* 97* 98* 101  CO2  --  21* 22 23 23 25   GLUCOSE  --  86 103* 149* 98 108*  BUN  --  45* 44* 39* 50* 26*  CREATININE  --  8.33* 8.29* 7.30* 7.11* 4.05*  CALCIUM  --  9.0 8.9 9.1 9.8 9.2  PHOS 4.5 6.1* 6.2* 4.9*  --   --    GFR: Estimated Creatinine Clearance: 12.3 mL/min (A) (by C-G formula based on SCr of 4.05 mg/dL (H)). Liver Function Tests: Recent Labs  Lab 04/17/17 0559 04/17/17 1026 04/18/17 0803 04/20/17 0730  AST  --   --   --  17  ALT  --   --   --  17  ALKPHOS  --   --   --  58  BILITOT  --   --   --  0.7  PROT  --   --   --  6.4*  ALBUMIN 3.2* 3.1* 3.0* 3.2*   No results for input(s): LIPASE, AMYLASE in the last 168 hours. No results for input(s): AMMONIA in the last 168 hours. Coagulation Profile: Recent Labs  Lab 04/18/17 0803 04/19/17 0958 04/20/17 0730 04/21/17 0014 04/22/17 0618  INR 1.79 1.32 1.26 1.45 1.29   Cardiac Enzymes: Recent Labs  Lab 04/17/17 1620 04/20/17 0730 04/20/17 1351 04/20/17 1824 04/21/17 0014  TROPONINI 0.33* 0.26* 0.27* 0.31* 0.25*   BNP (last 3 results) No results for input(s): PROBNP in the last 8760 hours. HbA1C: No results for input(s): HGBA1C in the last 72 hours. CBG: No results for input(s): GLUCAP in the last 168 hours. Lipid Profile: No results for input(s): CHOL, HDL, LDLCALC, TRIG, CHOLHDL, LDLDIRECT in the last 72 hours. Thyroid Function Tests: No results for input(s): TSH, T4TOTAL, FREET4, T3FREE, THYROIDAB in the last 72 hours. Anemia Panel: No results for input(s): VITAMINB12, FOLATE, FERRITIN, TIBC, IRON, RETICCTPCT in the last 72 hours. Sepsis Labs: No results for input(s): PROCALCITON, LATICACIDVEN in the last 168 hours.  Recent Results (from the past 240 hour(s))  Aerobic Culture (superficial specimen)     Status: None (Preliminary result)   Collection Time: 04/14/17  6:45 PM  Result Value Ref Range Status   Specimen Description  WOUND RIGHT LEG  Final   Special Requests Immunocompromised  Final   Gram Stain   Final    RARE WBC PRESENT, PREDOMINANTLY PMN NO ORGANISMS SEEN    Culture   Final    RARE MOLD ISOLATE REFERRED FOR ID ONLY Performed at [facility], [address], [place], [place] [address]    Report Status PENDING  Incomplete  Culture, blood (routine x 2)     Status: None   Collection Time: 04/14/17  7:15 PM  Result Value Ref Range Status   Specimen Description BLOOD HEMODIALYSIS CATHETER  Final   Special Requests   Final    BOTTLES DRAWN AEROBIC AND ANAEROBIC Blood Culture results may not be optimal due to an excessive volume of blood received in culture bottles   Culture   Final    NO GROWTH 5 DAYS Performed at [facility], [place] [address], [place], [place] [address]    Report Status 04/19/2017 FINAL  Final  Culture, blood (routine x 2)     Status: None   Collection Time: 04/14/17  7:20 PM  Result Value Ref Range Status   Specimen Description BLOOD HEMODIALYSIS CATHETER  Final   Special Requests   Final    BOTTLES DRAWN AEROBIC AND ANAEROBIC Blood Culture results may not be optimal due to an excessive volume of blood received in culture bottles   Culture   Final    NO GROWTH 5 DAYS Performed at [facility], [place] [address], [place], [place] [address]    Report Status 04/19/2017 FINAL  Final  MRSA PCR Screening     Status: None   Collection Time: 04/15/17  1:06 AM  Result Value Ref Range Status   MRSA by PCR NEGATIVE NEGATIVE Final    Comment:        The GeneXpert MRSA Assay (FDA approved for NASAL specimens only), is one component of a comprehensive MRSA colonization surveillance program. It is not intended to diagnose MRSA infection nor to guide or monitor treatment for MRSA infections. Performed at [facility], [place] [address], [place], [place] [address]   Culture, blood (Routine X 2) w Reflex to ID Panel  Status: None   Collection Time: 04/16/17  10:14 AM  Result Value Ref Range Status   Specimen Description BLOOD RIGHT ARM  Final   Special Requests   Final    BOTTLES DRAWN AEROBIC AND ANAEROBIC Blood Culture adequate volume   Culture   Final    NO GROWTH 5 DAYS Performed at [facility], [address] [place], [place] [address]    Report Status 04/21/2017 FINAL  Final  Culture, blood (Routine X 2) w Reflex to ID Panel     Status: None   Collection Time: 04/16/17 10:21 AM  Result Value Ref Range Status   Specimen Description BLOOD RIGHT HAND  Final   Special Requests   Final    BOTTLES DRAWN AEROBIC AND ANAEROBIC Blood Culture adequate volume   Culture   Final    NO GROWTH 5 DAYS Performed at [facility], [place] [address], [place], [place] [address]    Report Status 04/21/2017 FINAL  Final  Surgical pcr screen     Status: None   Collection Time: 04/17/17  2:22 AM  Result Value Ref Range Status   MRSA, PCR NEGATIVE NEGATIVE Final   Staphylococcus aureus NEGATIVE NEGATIVE Final    Comment: (NOTE) The Xpert SA Assay (FDA approved for NASAL specimens in patients 38 years of age and older), is one component of a comprehensive surveillance program. It is not intended to diagnose infection nor to guide or monitor treatment. Performed at [facility], [place] [address] [place], [place] [address]   MRSA PCR Screening     Status: None   Collection Time: 04/20/17  4:39 PM  Result Value Ref Range Status   MRSA by PCR NEGATIVE NEGATIVE Final    Comment:        The GeneXpert MRSA Assay (FDA approved for NASAL specimens only), is one component of a comprehensive MRSA colonization surveillance program. It is not intended to diagnose MRSA infection nor to guide or monitor treatment for MRSA infections. Performed at [facility], [place] [address] [place], [place] [address]          Radiology Studies: No results found.      Scheduled Meds: . ambrisentan  5 mg Oral Daily  . aspirin EC  81 mg  Oral Daily  . bisacodyl  10 mg Rectal Once  . cilostazol  100 mg Oral BID AC  . [START ON 04/23/2017] cinacalcet  90 mg Oral Q M,W,F-HD  . [START ON 04/23/2017] Darbepoetin Alfa  60 mcg Subcutaneous Q Mon-HD  . digoxin  0.125 mg Oral Once per day on Mon Wed Fri  . diltiazem  60 mg Oral TID  . diphenhydrAMINE  50 mg Oral Q M,W,F  . HYDROcodone-acetaminophen  1 tablet Oral Once  . isosorbide dinitrate  20 mg Oral TID  . lactulose  20 g Oral Once  . levETIRAcetam  500 mg Oral Q2000  . levothyroxine  150 mcg Oral QAC breakfast  . metoprolol succinate  100 mg Oral Daily  . multivitamin  1 tablet Oral QHS  . pantoprazole  40 mg Oral QHS  . pravastatin  40 mg Oral QHS  . ranolazine  500 mg Oral BID  . sevelamer carbonate  1,600 mg Oral TID WC  . sodium chloride flush  3 mL Intravenous Q12H  . Warfarin - Pharmacist Dosing Inpatient   Does not apply q1800   Continuous Infusions: . sodium chloride    . sodium chloride    .  sodium chloride       LOS: 2 days     Georgette Shell, MD [facility]  If 7PM-7AM, please contact night-coverage [email or web address] Password [facility] 04/22/2017, 9:42 AM

## 2017-04-22 NOTE — Progress Notes (Signed)
[facility] Progress Note   Subjective: no c/o, up on side of bed eating breakfast, no sob or cough   Objective Vitals:   04/21/17 2326 04/22/17 0300 04/22/17 0543 04/22/17 0752  BP: (!) 100/48 124/69  117/71  Pulse:  (!) 113    Resp: (!) 23 20  14   Temp:  98.3 F (36.8 C)  (!) 97.5 F (36.4 C)  TempSrc:  Oral  Oral  SpO2:  94%    Weight:   79.8 kg (175 lb 14.8 oz)   Height:       Physical Exam General:NAD, WDWN female, sitting in bed Heart:regular rate, irregular rhythm  Lungs:CTAB Abdomen:soft, NTND Extremities:1+ bilat LE edema Dialysis Access: L thigh TDC dsg intact no drainage, LU AVG +b/t    CXR 4/19 - negative, no edema  Dialysis Orders:  MWF East 75kg 2/2 bath P4  TDC L thigh // maturing LUA AVG Hep none (allergic) -Epogen 9600 Units IV qHD - Weekly dose 67209 -Sensipar 120mg  PO qHD -Calcitriol 2.30mch PO qHD   Assessment/Plan: 1. A fib w/RVR - meds per primary. No heparin d/t allergy. Had difficulty with new medication regimen at home. Primary to arrange [facility] to help  2. Recent cath drainage - sp catheter removal/ replacement last admission. Cx's neg, completed course of vancomycin/Fortaz.  3. ESRD - HD MWF. Next HD 4/22 4. Volume - still vol up w/ LE edema, ^UF goal on HD tomorrow. Vol control difficult due to  Intermittent problems with hypotension, high afib VR, and/or angina while on HD.  4. Anemia of CKD- Hgb 9's  S/p transfusion during prior admision, started darbe 60 ug/ Monday, 1st dose 4/22 5. MBD ckd  - Ca/Phos at goal.  Cont Senispar, calcitriol.Renvela 2AC TID, w/snacks BID. 6. HTN - BP's on lower side partly due to afib meds  7. Nutrition - Renal diet with fluid restrictions.  8.  Seizure do - on keppra 9. Chronic CHF/CAD - per primary  10.  S/p new LUA AVG - placed on 4/4 by Dr. Bridgett Larsson, 4 wks will be 05/03/17   Kelly Splinter MD [facility] pager [phone]   04/22/2017, 11:00 AM     Additional  Objective Labs: Basic Metabolic Panel: Recent Labs  Lab 04/17/17 0559 04/17/17 1026 04/18/17 0803 04/20/17 0730 04/21/17 0014  NA 134* 132* 133* 137 137  K 4.5 4.4 4.5 4.4 3.4*  CL 97* 95* 97* 98* 101  CO2 21* 22 23 23 25   GLUCOSE 86 103* 149* 98 108*  BUN 45* 44* 39* 50* 26*  CREATININE 8.33* 8.29* 7.30* 7.11* 4.05*  CALCIUM 9.0 8.9 9.1 9.8 9.2  PHOS 6.1* 6.2* 4.9*  --   --    CBC: Recent Labs  Lab 04/17/17 0559 04/17/17 1026 04/18/17 0847 04/20/17 0730 04/21/17 0014  WBC 4.4 4.6 4.9 6.1 6.3  NEUTROABS 2.5  --   --  4.0  --   HGB 9.4* 9.6* 8.8* 9.4* 9.3*  HCT 29.5* 29.3* 27.3* 28.9* 27.3*  MCV 83.8 82.8 84.0 85.3 83.7  PLT 144* 112* 133* 129* 118*   Blood Culture    Component Value Date/Time   SDES BLOOD RIGHT HAND 04/16/2017 1021   SPECREQUEST  04/16/2017 1021    BOTTLES DRAWN AEROBIC AND ANAEROBIC Blood Culture adequate volume   CULT  04/16/2017 1021    NO GROWTH 5 DAYS Performed at [facility], [place] [address], [place], [place] [address]    REPTSTATUS 04/21/2017 FINAL 04/16/2017 1021  Cardiac Enzymes: Recent Labs  Lab 04/17/17 1620 04/20/17 0730 04/20/17 1351 04/20/17 1824 04/21/17 0014  TROPONINI 0.33* 0.26* 0.27* 0.31* 0.25*   CBG: No results for input(s): GLUCAP in the last 168 hours. Iron Studies: No results for input(s): IRON, TIBC, TRANSFERRIN, FERRITIN in the last 72 hours. Lab Results  Component Value Date   INR 1.29 04/22/2017   INR 1.45 04/21/2017   INR 1.26 04/20/2017   Medications: . sodium chloride    . sodium chloride    . sodium chloride     . ambrisentan  5 mg Oral Daily  . aspirin EC  81 mg Oral Daily  . bisacodyl  10 mg Rectal Once  . cilostazol  100 mg Oral BID AC  . [START ON 04/23/2017] cinacalcet  90 mg Oral Q M,W,F-HD  . [START ON 04/23/2017] Darbepoetin Alfa  60 mcg Subcutaneous Q Mon-HD  . digoxin  0.125 mg Oral Once per day on Mon Wed Fri  . diltiazem  60 mg Oral TID  . diphenhydrAMINE  50 mg Oral Q  M,W,F  . HYDROcodone-acetaminophen  1 tablet Oral Once  . isosorbide dinitrate  20 mg Oral TID  . lactulose  20 g Oral Once  . levETIRAcetam  500 mg Oral Q2000  . levothyroxine  150 mcg Oral QAC breakfast  . metoprolol succinate  100 mg Oral Daily  . multivitamin  1 tablet Oral QHS  . pantoprazole  40 mg Oral QHS  . pravastatin  40 mg Oral QHS  . ranolazine  500 mg Oral BID  . sevelamer carbonate  1,600 mg Oral TID WC  . sodium chloride flush  3 mL Intravenous Q12H  . warfarin  10 mg Oral ONCE-1800  . Warfarin - Pharmacist Dosing Inpatient   Does not apply [phone]

## 2017-04-22 NOTE — Progress Notes (Signed)
ANTICOAGULATION CONSULT NOTE - [place] for warfarin Indication: atrial fibrillation  Allergies  Allergen Reactions  . Ace Inhibitors Anaphylaxis and Rash  . Heparin Other (See Comments)    MDs told her not to take after reaction in ICU. Tolerates Lovenox fine  . Iohexol Swelling and Other (See Comments)    1970s; passed out and had facial/tongue swelling.  Requires 13-hour prep with prednisone and benadryl  . Phenytoin Other (See Comments)    Had reaction while in ICU; doesn't know.  MDs told her not to take ever again.  . Wellbutrin [Bupropion] Other (See Comments)    seizures  . Meperidine Hcl Swelling and Rash    Makes tongue swell  . Morphine Rash  . Penicillins Hives and Rash    Has patient had a PCN reaction causing immediate rash, facial/tongue/throat swelling, SOB or lightheadedness with hypotension: Yes Has patient had a PCN reaction causing severe rash involving mucus membranes or skin necrosis: No Has patient had a PCN reaction that required hospitalization: No Has patient had a PCN reaction occurring within the last 10 years: No If all of the above answers are "NO", then may proceed with Cephalosporin use.    . Valproic Acid And Related Other (See Comments)    Confusion   . Iodinated Diagnostic Agents Other (See Comments)    Pre-meditate with benadryl and prednisone 3 times before appt.  . Pentazocine Lactate Other (See Comments)    Patient does not remember reaction to this med ([place]).     Patient Measurements: Height: 5\' 2"  (157.5 cm) Weight: 175 lb 14.8 oz (79.8 kg) IBW/kg (Calculated) : 50.1   Vital Signs: Temp: 97.5 F (36.4 C) (04/21 0752) Temp Source: Oral (04/21 0752) BP: 117/71 (04/21 0752) Pulse Rate: 113 (04/21 0300)  Labs: Recent Labs    04/20/17 0730 04/20/17 1351 04/20/17 1824 04/21/17 0014 04/22/17 0618  HGB 9.4*  --   --  9.3*  --   HCT 28.9*  --   --  27.3*  --   PLT 129*  --   --  118*  --    LABPROT 15.7*  --   --  17.5* 16.0*  INR 1.26  --   --  1.45 1.29  CREATININE 7.11*  --   --  4.05*  --   TROPONINI 0.26* 0.27* 0.31* 0.25*  --     Estimated Creatinine Clearance: 12.3 mL/min (A) (by C-G formula based on SCr of 4.05 mg/dL (H)).   Medical History: Past Medical History:  Diagnosis Date  . Anemia    History  of Blood transfusion  . Asthma    PMH  . Atrial fibrillation with RVR ([place]) 11/2016  . CAD (coronary artery disease)    stent to RCA  . Cancer ([employer])    clear cell cancer, kidney  . Complication of anesthesia 12/2010   pt is very confused, with AMS with anesthesia  . CVA (cerebral infarction) 2003   no apparent residual  . Dyslipidemia   . Encephalopathy   . ESRD 07/27/2008   ESRD due to HTN and NSAID's, started hemodialysis in 2005 in [place], [place]. Went to [facility] from 2010 to 2012 and since 2012 has been getting dialysis at [facility] on [address] in [place] on a MWF schedule. First access with RUA AVG placed in [place]. Next and current access was LUA AVG placed by Dr. Lucky Cowboy in [place] in or around 2012. Has had 2 or 3 procedures on  that graft since placed per family. She gets her access work done here in [place] now. She is allergic to heparin and does not get any heparin at dialysis; she had an allergic reaction apparently when in ICU in the past.      . GERD (gastroesophageal reflux disease)   . Hyperlipidemia   . Hypertension   . Hypothyroidism   . Myocardial infarction ([place])    per pt  . Positive PPD    completed rifampin  . Pulmonary hypertension ([place])   . PVD (peripheral vascular disease) ([place])   . Seizures ([place])    last seizure 6 years ago  . Sleep apnea    Sleep Study 2008  . Traumatic seroma of left lower leg ([place])   . Wears partial dentures      Assessment: 72 yo female with AFib and reported non-compliance with warfarin since discharge 4/10. INR remains subtherapeutic at 1.29.   Goal of Therapy:  INR  2-3 Monitor platelets by anticoagulation protocol: Yes  Plan:  -Warfarin 10mg  PO x1 tonight -Daily INR  [name], PharmD, BCPS PGY-2 Cardiology Pharmacy Resident Pager: [phone] 04/22/2017

## 2017-04-22 NOTE — Progress Notes (Signed)
Progress Note  Patient Name: [name] Date of Encounter: 04/22/2017  Primary Cardiologist: Dr. Minus Breeding  Subjective   Shortness of breath generally improved.  No chest pain or palpitations.  Inpatient Medications    Scheduled Meds: . ambrisentan  5 mg Oral Daily  . aspirin EC  81 mg Oral Daily  . bisacodyl  10 mg Rectal Once  . cilostazol  100 mg Oral BID AC  . [START ON 04/23/2017] cinacalcet  90 mg Oral Q M,W,F-HD  . [START ON 04/23/2017] Darbepoetin Alfa  60 mcg Subcutaneous Q Mon-HD  . digoxin  0.125 mg Oral Once per day on Mon Wed Fri  . diltiazem  60 mg Oral TID  . diphenhydrAMINE  50 mg Oral Q M,W,F  . HYDROcodone-acetaminophen  1 tablet Oral Once  . isosorbide dinitrate  20 mg Oral TID  . lactulose  20 g Oral Once  . levETIRAcetam  500 mg Oral Q2000  . levothyroxine  150 mcg Oral QAC breakfast  . metoprolol succinate  100 mg Oral Daily  . multivitamin  1 tablet Oral QHS  . pantoprazole  40 mg Oral QHS  . pravastatin  40 mg Oral QHS  . ranolazine  500 mg Oral BID  . sevelamer carbonate  1,600 mg Oral TID WC  . sodium chloride flush  3 mL Intravenous Q12H  . warfarin  10 mg Oral ONCE-1800  . Warfarin - Pharmacist Dosing Inpatient   Does not apply q1800   Continuous Infusions: . sodium chloride    . sodium chloride    . sodium chloride     PRN Meds: sodium chloride, sodium chloride, sodium chloride, acetaminophen, albuterol, alteplase, HYDROcodone-acetaminophen, lidocaine (PF), lidocaine-prilocaine, nitroGLYCERIN, ondansetron (ZOFRAN) IV, pentafluoroprop-tetrafluoroeth, polyethylene glycol, senna-docusate, sodium chloride flush, traZODone   Vital Signs    Vitals:   04/21/17 2326 04/22/17 0300 04/22/17 0543 04/22/17 0752  BP: (!) 100/48 124/69  117/71  Pulse:  (!) 113    Resp: (!) _0 Temp:  98.3 F (36.8 C)  (!) 97.5 F (36.4 C)  TempSrc:  Oral  Oral  SpO2:  94%    Weight:   175 lb 14.8 oz (79.8 kg)   Height:        Intake/Output  Summary (Last 24 hours) at 04/22/2017 1129 Last data filed at 04/22/2017 0900 Gross per 24 hour  Intake 630 ml  Output 1 ml  Net 629 ml   Filed Weights   04/20/17 2342 04/21/17 0557 04/22/17 0543  Weight: 177 lb 4 oz (80.4 kg) 173 lb 15.1 oz (78.9 kg) 175 lb 14.8 oz (79.8 kg)    Telemetry    Atrial fibrillation. Personally reviewed.  Physical Exam   GEN: No acute distress.   Neck: No JVD. Cardiac:  Irregularly irregular, 2/6 systolic murmur, no gallop.  Respiratory: Nonlabored. Clear to auscultation bilaterally. GI: Soft, nontender, bowel sounds present. MS:  Left upper extremity AV graft. Neuro:  Nonfocal. Psych: Alert and oriented x 3. Normal affect.  Labs    Chemistry Recent Labs  Lab 04/17/17 1026 04/18/17 0803 04/20/17 0730 04/21/17 0014  NA 132* 133* 137 137  K 4.4 4.5 4.4 3.4*  CL 95* 97* 98* 101  CO2 _1 GLUCOSE 103* 149* 98 108*  BUN 44* 39* 50* 26*  CREATININE 8.29* 7.30* 7.11* 4.05*  CALCIUM 8.9 9.1 9.8 9.2  PROT  --   --  6.4*  --   ALBUMIN 3.1* 3.0* 3.2*  --  AST  --   --  17  --   ALT  --   --  17  --   ALKPHOS  --   --  58  --   BILITOT  --   --  0.7  --   GFRNONAA 4* 5* 5* 10*  GFRAA 5* 6* 6* 12*  ANIONGAP 15 13 16* 11     Hematology Recent Labs  Lab 04/18/17 0847 04/20/17 0730 04/21/17 0014  WBC 4.9 6.1 6.3  RBC 3.25* 3.39* 3.26*  HGB 8.8* 9.4* 9.3*  HCT 27.3* 28.9* 27.3*  MCV 84.0 85.3 83.7  MCH 27.1 27.7 28.5  MCHC 32.2 32.5 34.1  RDW 18.8* 19.7* 19.8*  PLT 133* 129* 118*    Cardiac Enzymes Recent Labs  Lab 04/20/17 0730 04/20/17 1351 04/20/17 1824 04/21/17 0014  TROPONINI 0.26* 0.27* 0.31* 0.25*   No results for input(s): TROPIPOC in the last 168 hours.    Radiology    No results found.  Cardiac Studies   Echocardiogram 11/23/2016: Study Conclusions  - Left ventricle: The cavity size was normal. Wall thickness was increased in a pattern of mild LVH. Systolic function was normal. The  estimated ejection fraction was in the range of 55% to 60%. Wall motion was normal; there were no regional wall motion abnormalities. Doppler parameters are consistent with high ventricular filling pressure. - Aortic valve: There was mild stenosis. - Mitral valve: Severely calcified annulus. The findings are consistent with mild stenosis. There was mild regurgitation. Valve area by pressure half-time: 2.02 cm^2. - Left atrium: The atrium was severely dilated. - Right atrium: The atrium was mildly dilated. - Tricuspid valve: There was severe regurgitation. - Pulmonary arteries: Systolic pressure was severely increased. PA peak pressure: 80 mm Hg (S). - Pericardium, extracardiac: A small pericardial effusion was identified.  Impressions:  - Normal LV systolic function; mild LVH; calcified aortic valve with mild AS (mean gradient 12 mmHg); severe MAC with mild MS and mild MR; biatrial enlargment; moderate to severe TR withi severely elevated pulmonary pressure; small pericardial effusion.  Patient Profile     72 y.o. female with ESRD on HD, pulmonary HTN, essential HTN, HLD, CAD w/ previous RCA stenting, OSA and atrial fibrillationon warfarin.  Assessment & Plan    1.  Persistent atrial fibrillation with intermittent RVR.  Blood pressure is low normal and relatively stable.  She continues on Toprol-XL, short acting Cardizem, and Lanoxin. On Coumadin per pharmacy for stroke prophylaxis.  2.  Elevated troponin I in flat pattern consistent with demand ischemia.  3.  ESRD on hemodialysis.  4.  CAD without active angina on current medical therapy.  She is on nitrates and Ranexa.  Will reduce Isordil to 10 mg three times a day.  Continue Toprol-XL, short acting Cardizem, and Lanoxin for heart rate control. Coumadin being adjusted by pharmacy.  Signed, Rozann Lesches, MD  04/22/2017, 11:29 AM

## 2017-04-23 LAB — RENAL FUNCTION PANEL
ALBUMIN: 3.1 g/dL — AB (ref 3.5–5.0)
ANION GAP: 14 (ref 5–15)
BUN: 50 mg/dL — ABNORMAL HIGH (ref 6–20)
CO2: 22 mmol/L (ref 22–32)
Calcium: 9.3 mg/dL (ref 8.9–10.3)
Chloride: 97 mmol/L — ABNORMAL LOW (ref 101–111)
Creatinine, Ser: 8.3 mg/dL — ABNORMAL HIGH (ref 0.44–1.00)
GFR calc Af Amer: 5 mL/min — ABNORMAL LOW (ref >60)
GFR calc non Af Amer: 4 mL/min — ABNORMAL LOW (ref >60)
Glucose, Bld: 97 mg/dL (ref 65–99)
PHOSPHORUS: 5.7 mg/dL — AB (ref 2.5–4.6)
POTASSIUM: 5.6 mmol/L — AB (ref 3.5–5.1)
Sodium: 133 mmol/L — ABNORMAL LOW (ref 135–145)

## 2017-04-23 LAB — CBC
HEMATOCRIT: 27.1 % — AB (ref 36.0–46.0)
HEMOGLOBIN: 9.1 g/dL — AB (ref 12.0–15.0)
MCH: 28.1 pg (ref 26.0–34.0)
MCHC: 33.6 g/dL (ref 30.0–36.0)
MCV: 83.6 fL (ref 78.0–100.0)
Platelets: 131 10*3/uL — ABNORMAL LOW (ref 150–400)
RBC: 3.24 MIL/uL — ABNORMAL LOW (ref 3.87–5.11)
RDW: 19.8 % — ABNORMAL HIGH (ref 11.5–15.5)
WBC: 6.1 10*3/uL (ref 4.0–10.5)

## 2017-04-23 LAB — PROTIME-INR
INR: 1.39
Prothrombin Time: 17 seconds — ABNORMAL HIGH (ref 11.4–15.2)

## 2017-04-23 MED ORDER — LACTULOSE 10 GM/15ML PO SOLN: 20.0000 g | Freq: Once | ORAL | Status: DC | PRN

## 2017-04-23 MED ORDER — DARBEPOETIN ALFA 60 MCG/0.3ML IJ SOSY
PREFILLED_SYRINGE | INTRAMUSCULAR | Status: AC
  Administered 2017-04-23: 60 ug
  Filled 2017-04-23: qty 0.3

## 2017-04-23 MED ORDER — BISACODYL 10 MG RE SUPP: 10.0000 mg | Freq: Once | RECTAL | Status: DC

## 2017-04-23 MED ORDER — WARFARIN SODIUM 10 MG PO TABS
10.0000 mg | ORAL_TABLET | Freq: Once | ORAL | Status: AC
  Administered 2017-04-23: 10 mg via ORAL
  Filled 2017-04-23: qty 1

## 2017-04-23 MED ORDER — BISACODYL 5 MG PO TBEC
10.0000 mg | DELAYED_RELEASE_TABLET | Freq: Every day | ORAL | Status: DC
  Administered 2017-04-25: 10 mg via ORAL
  Filled 2017-04-23 (×3): qty 2

## 2017-04-23 MED ORDER — WARFARIN SODIUM 10 MG PO TABS: 10.0000 mg | ORAL_TABLET | Freq: Once | ORAL | Status: AC

## 2017-04-23 MED ORDER — DIPHENHYDRAMINE HCL 25 MG PO CAPS
ORAL_CAPSULE | ORAL | Status: AC
  Administered 2017-04-23: 50 mg via ORAL
  Filled 2017-04-23: qty 2

## 2017-04-23 NOTE — Progress Notes (Signed)
PT Cancellation Note  Patient Details Name: [name] MRN: [id number] DOB: 1945-11-30   Cancelled Treatment:    Reason Eval/Treat Not Completed: Patient at procedure or test/unavailable   Ehsan Corvin B Deonne Rooks 04/23/2017, 11:15 AM  Elwyn Reach, [place]

## 2017-04-23 NOTE — Procedures (Signed)
Tolerating HD today.  Goal 3500cc over weekend. Current BP 111/52 and HR 86. Using LUE AV access.  BFR 400cc/min No chest pain  [name], MD

## 2017-04-23 NOTE — Progress Notes (Signed)
ANTICOAGULATION CONSULT NOTE - [facility] for warfarin Indication: atrial fibrillation  Labs: Recent Labs    04/20/17 1351 04/20/17 1824 04/21/17 0014 04/22/17 0618 04/23/17 0206 04/23/17 0728  HGB  --   --  9.3*  --   --  9.1*  HCT  --   --  27.3*  --   --  27.1*  PLT  --   --  118*  --   --  131*  LABPROT  --   --  17.5* 16.0* 17.0*  --   INR  --   --  1.45 1.29 1.39  --   CREATININE  --   --  4.05*  --   --  8.30*  TROPONINI 0.27* 0.31* 0.25*  --   --   --    Estimated Creatinine Clearance: 6.1 mL/min (A) (by C-G formula based on SCr of 8.3 mg/dL (H)).  Assessment: 72 yo female with AFib and reported non-compliance with warfarin since discharge 4/10. INR remains subtherapeutic at 1.39 but trending up after dose increase yesterday.  She is without noted bleeding complications and CBC remains stable.   Potential Drug/Drug interactions: 1.  ASA/Warfarin - The risk of bleeding, particularly gastrointestinal, may be increased by coadministration of warfarin with aspirin EC. However, use of low-dose aspirin with warfarin may provide benefit that outweighs the risk of minor bleeding.  2.  Levothyroxine/Warfarin - The hypoprothrombinemic effect of warfarin may be increased by levothyroxine.    Goal of Therapy:  INR 2-3 Monitor platelets by anticoagulation protocol: Yes  Plan:  -Warfarin 10mg  PO x1 tonight -Daily INR  Rober Minion, PharmD., MS Clinical Pharmacist Pager:  [phone] Thank you for allowing pharmacy to be part of this patients care team. 04/23/2017

## 2017-04-23 NOTE — Progress Notes (Signed)
OT Cancellation Note  Patient Details Name: [name] MRN: [id number] DOB: May 20, 1945   Cancelled Treatment:    Reason Eval/Treat Not Completed: Patient at procedure or test/ unavailable. Pt currently in dialysis.  Almon Register [phone] 04/23/2017, 8:43 AM

## 2017-04-23 NOTE — Progress Notes (Signed)
PROGRESS NOTE    [name]  UMP:[id number] DOB: April 26, 1945 DOA: 04/20/2017 PCP: Glendale Chard, MD   Brief Narrative:72 y.o.femalewith medical history significantfor end-stage renal disease on dialysis, hypertension, pulmonary hypertension, hyperlipidemia, CAD with previous RCA stenting, obstructive sleep apnea and atrial fibrillation on Coumadin presents to the emergency department with the chief complaint of chest pain. Initial evaluation reveals atrial fibrillation with rapid ventricular response. [facility] hospitalists are asked to admit  Information is obtained from the patient and the chart. She reports she was discharged yesterday after a five-day hospitalization for atrial fib with RVR, elevated troponin without acute EKG changes, anemia, infection and dialysis catheter. She reports she at home yesterday afternoon went to bed. She did not feel well as she believes "I should not have come home". She reports she went to dialysis center today they referred her to the emergency department for a heart rate range 80-140. She denies headache dizziness syncope or near-syncope. She does endorse some intermittent chest pain and palpitations that she says are chronic. She denies shortness of breath diaphoresis nausea vomiting diarrhea. She denies any lower extremity edema cough fever chills. She reports she did not take her medication today she usually does this after dialysis. ED Course:in the emergency department she's afebrile with a heart rate in 147 is not hypoxic she is provided with Cardizem drip. At the time of admission rate 64 blood pressure 120/61     Assessment & Plan:   Principal Problem:   Atrial fibrillation with RVR (HCC) Active Problems:   Essential hypertension, malignant   Coronary atherosclerosis   Chronic diastolic heart failure (HCC)   ESRD   Depression   GERD (gastroesophageal reflux disease)   Epilepsy (HCC)   Dyslipidemia   Elevated troponin   A-fib  (HCC)  A. fib with RVR recurrent secondary to noncompliance to medications. Patient was discharged from the hospital for 18 and was readmitted from dialysis center with shortness of breath and elevated heart rate. He was started on Cardizem drip in the ER. Patient reported that she did not know that she had new medications to take at home. It is noted that her heart rate was difficult to control even during the last admission due to soft blood pressure. She is currently on digoxin 0.125 mg 3 times a week, Cardizem 60 mg 3 times daily, Isordil 20 mg 3 times a day, Toprol 100 mg daily, Ranexa 500 mg twice a day aspirin and Coumadin. Patient also has a history of pulmonary hypertension for which she takes ambrisentan. Will place consult to social services and home health care upon discharge and will discuss with patient's daughter to see if we can maximize her compliance to medications. Discussed with daughter Denice Paradise who is planning to move in with her mom June 2019. however she would like some kind of help home health social services to assist with managing her medications at home.  PT OT has seen the patient has  Recommended home health PT.  2]end-stage renal disease dialysis Monday Wednesday and Friday. She was unable to finish dialysis on the day of admission yesterday. Nephrologyfollowing. Patient had infected TDC right thigh which was removed 414 and a new TDC has been placed in the left thigh. She got her last dose of antibiotics vancomycin during last session.  3]anemia status post blood transfusion last admission. hemoglobin 9.3 today stable.  4]history of hypertension but with soft blood pressure.  5]hypothyroidism continue Synthroid.  6]seizure disorder continue Keppra.  7]constipation-dulcolax  DVT prophylaxis: Coumadin Code Status: DNR Family Communication: No family available Disposition Plan: Hope to discharge in the next 1-2 days if okay with  cardiology. Consultants: Nephrology, cardiology  Procedures: None Antimicrobials: None Subjective: Feels better but feels she is not ready to go home yet.  Objective: Vitals:   04/23/17 0900 04/23/17 0930 04/23/17 1000 04/23/17 1030  BP: (!) 125/56 (!) 118/45 (!) 115/50 (!) 106/54  Pulse: 92 98 (!) 111 (!) 118  Resp:      Temp:      TempSrc:      SpO2:      Weight:      Height:        Intake/Output Summary (Last 24 hours) at 04/23/2017 1235 Last data filed at 04/22/2017 2047 Gross per 24 hour  Intake 720 ml  Output 0 ml  Net 720 ml   Filed Weights   04/22/17 0543 04/23/17 0522 04/23/17 0720  Weight: 79.8 kg (175 lb 14.8 oz) 81.3 kg (179 lb 4.8 oz) 82.1 kg (181 lb)    Examination:  General exam: Appears calm and comfortable  Respiratory system: decresed breath sounds auscultation. Respiratory effort normal. Cardiovascular system: S1 & S2 heard, RRR. No JVD, murmurs, rubs, gallops or clicks. No pedal edema. Gastrointestinal system: Abdomen is nondistended, soft and nontender. No organomegaly or masses felt. Normal bowel sounds heard. Central nervous system: Alert and oriented. No focal neurological deficits. Extremities: Symmetric 5 x 5 power. Skin: No rashes, lesions or ulcers Psychiatry: Judgement and insight appear normal. Mood & affect appropriate.     Data Reviewed: I have personally reviewed following labs and imaging studies  CBC: Recent Labs  Lab 04/17/17 0559 04/17/17 1026 04/18/17 0847 04/20/17 0730 04/21/17 0014 04/23/17 0728  WBC 4.4 4.6 4.9 6.1 6.3 6.1  NEUTROABS 2.5  --   --  4.0  --   --   HGB 9.4* 9.6* 8.8* 9.4* 9.3* 9.1*  HCT 29.5* 29.3* 27.3* 28.9* 27.3* 27.1*  MCV 83.8 82.8 84.0 85.3 83.7 83.6  PLT 144* 112* 133* 129* 118* 751*   Basic Metabolic Panel: Recent Labs  Lab 04/17/17 0559 04/17/17 1026 04/18/17 0803 04/20/17 0730 04/21/17 0014 04/23/17 0728  NA 134* 132* 133* 137 137 133*  K 4.5 4.4 4.5 4.4 3.4* 5.6*  CL 97* 95* 97*  98* 101 97*  CO2 21* 22 23 23 25 22   GLUCOSE 86 103* 149* 98 108* 97  BUN 45* 44* 39* 50* 26* 50*  CREATININE 8.33* 8.29* 7.30* 7.11* 4.05* 8.30*  CALCIUM 9.0 8.9 9.1 9.8 9.2 9.3  PHOS 6.1* 6.2* 4.9*  --   --  5.7*   GFR: Estimated Creatinine Clearance: 6.1 mL/min (A) (by C-G formula based on SCr of 8.3 mg/dL (H)). Liver Function Tests: Recent Labs  Lab 04/17/17 0559 04/17/17 1026 04/18/17 0803 04/20/17 0730 04/23/17 0728  AST  --   --   --  17  --   ALT  --   --   --  17  --   ALKPHOS  --   --   --  58  --   BILITOT  --   --   --  0.7  --   PROT  --   --   --  6.4*  --   ALBUMIN 3.2* 3.1* 3.0* 3.2* 3.1*   No results for input(s): LIPASE, AMYLASE in the last 168 hours. No results for input(s): AMMONIA in the last 168 hours. Coagulation Profile: Recent Labs  Lab 04/19/17 [phone]  04/20/17 0730 04/21/17 0014 04/22/17 0618 04/23/17 0206  INR 1.32 1.26 1.45 1.29 1.39   Cardiac Enzymes: Recent Labs  Lab 04/17/17 1620 04/20/17 0730 04/20/17 1351 04/20/17 1824 04/21/17 0014  TROPONINI 0.33* 0.26* 0.27* 0.31* 0.25*   BNP (last 3 results) No results for input(s): PROBNP in the last 8760 hours. HbA1C: No results for input(s): HGBA1C in the last 72 hours. CBG: No results for input(s): GLUCAP in the last 168 hours. Lipid Profile: No results for input(s): CHOL, HDL, LDLCALC, TRIG, CHOLHDL, LDLDIRECT in the last 72 hours. Thyroid Function Tests: No results for input(s): TSH, T4TOTAL, FREET4, T3FREE, THYROIDAB in the last 72 hours. Anemia Panel: No results for input(s): VITAMINB12, FOLATE, FERRITIN, TIBC, IRON, RETICCTPCT in the last 72 hours. Sepsis Labs: No results for input(s): PROCALCITON, LATICACIDVEN in the last 168 hours.  Recent Results (from the past 240 hour(s))  Aerobic Culture (superficial specimen)     Status: None (Preliminary result)   Collection Time: 04/14/17  6:45 PM  Result Value Ref Range Status   Specimen Description WOUND RIGHT LEG  Final    Special Requests Immunocompromised  Final   Gram Stain   Final    RARE WBC PRESENT, PREDOMINANTLY PMN NO ORGANISMS SEEN    Culture   Final    RARE MOLD ISOLATE REFERRED FOR ID ONLY Performed at [facility], [address], [place], [place] [address]    Report Status PENDING  Incomplete  Culture, blood (routine x 2)     Status: None   Collection Time: 04/14/17  7:15 PM  Result Value Ref Range Status   Specimen Description BLOOD HEMODIALYSIS CATHETER  Final   Special Requests   Final    BOTTLES DRAWN AEROBIC AND ANAEROBIC Blood Culture results may not be optimal due to an excessive volume of blood received in culture bottles   Culture   Final    NO GROWTH 5 DAYS Performed at [facility], [place] [address], [place], [place] [address]    Report Status 04/19/2017 FINAL  Final  Culture, blood (routine x 2)     Status: None   Collection Time: 04/14/17  7:20 PM  Result Value Ref Range Status   Specimen Description BLOOD HEMODIALYSIS CATHETER  Final   Special Requests   Final    BOTTLES DRAWN AEROBIC AND ANAEROBIC Blood Culture results may not be optimal due to an excessive volume of blood received in culture bottles   Culture   Final    NO GROWTH 5 DAYS Performed at [facility], [place] [address], [place], [place] [address]    Report Status 04/19/2017 FINAL  Final  MRSA PCR Screening     Status: None   Collection Time: 04/15/17  1:06 AM  Result Value Ref Range Status   MRSA by PCR NEGATIVE NEGATIVE Final    Comment:        The GeneXpert MRSA Assay (FDA approved for NASAL specimens only), is one component of a comprehensive MRSA colonization surveillance program. It is not intended to diagnose MRSA infection nor to guide or monitor treatment for MRSA infections. Performed at [facility], [place] [address] [place], [place] [address]   Culture, blood (Routine X 2) w Reflex to ID Panel     Status: None   Collection Time: 04/16/17 10:14 AM  Result Value Ref  Range Status   Specimen Description BLOOD RIGHT ARM  Final   Special Requests   Final  BOTTLES DRAWN AEROBIC AND ANAEROBIC Blood Culture adequate volume   Culture   Final    NO GROWTH 5 DAYS Performed at [facility], [place] [address] [place], [place] [address]    Report Status 04/21/2017 FINAL  Final  Culture, blood (Routine X 2) w Reflex to ID Panel     Status: None   Collection Time: 04/16/17 10:21 AM  Result Value Ref Range Status   Specimen Description BLOOD RIGHT HAND  Final   Special Requests   Final    BOTTLES DRAWN AEROBIC AND ANAEROBIC Blood Culture adequate volume   Culture   Final    NO GROWTH 5 DAYS Performed at [facility], [place] [address] [place], [place] [address]    Report Status 04/21/2017 FINAL  Final  Surgical pcr screen     Status: None   Collection Time: 04/17/17  2:22 AM  Result Value Ref Range Status   MRSA, PCR NEGATIVE NEGATIVE Final   Staphylococcus aureus NEGATIVE NEGATIVE Final    Comment: (NOTE) The Xpert SA Assay (FDA approved for NASAL specimens in patients 62 years of age and older), is one component of a comprehensive surveillance program. It is not intended to diagnose infection nor to guide or monitor treatment. Performed at [facility], [place] [address] [place], [place] [address]   MRSA PCR Screening     Status: None   Collection Time: 04/20/17  4:39 PM  Result Value Ref Range Status   MRSA by PCR NEGATIVE NEGATIVE Final    Comment:        The GeneXpert MRSA Assay (FDA approved for NASAL specimens only), is one component of a comprehensive MRSA colonization surveillance program. It is not intended to diagnose MRSA infection nor to guide or monitor treatment for MRSA infections. Performed at [facility], [place] [address], [place], [place] [address]          Radiology Studies: No results found.      Scheduled Meds: . ambrisentan  5 mg Oral Daily  . aspirin EC  81 mg Oral Daily  . bisacodyl  10  mg Rectal Once  . cilostazol  100 mg Oral BID AC  . cinacalcet  90 mg Oral Q M,W,F-HD  . Darbepoetin Alfa  60 mcg Subcutaneous Q Mon-HD  . digoxin  0.125 mg Oral Once per day on Mon Wed Fri  . diltiazem  60 mg Oral TID  . diphenhydrAMINE  50 mg Oral Q M,W,F  . HYDROcodone-acetaminophen  1 tablet Oral Once  . isosorbide dinitrate  10 mg Oral TID  . lactulose  20 g Oral Once  . levETIRAcetam  500 mg Oral Q2000  . levothyroxine  150 mcg Oral QAC breakfast  . metoprolol succinate  100 mg Oral Daily  . multivitamin  1 tablet Oral QHS  . pantoprazole  40 mg Oral QHS  . pravastatin  40 mg Oral QHS  . ranolazine  500 mg Oral BID  . sevelamer carbonate  1,600 mg Oral TID WC  . sodium chloride flush  3 mL Intravenous Q12H  . warfarin  10 mg Oral ONCE-1800  . warfarin  10 mg Oral ONCE-1800  . Warfarin - Pharmacist Dosing Inpatient   Does not apply q1800   Continuous Infusions: . sodium chloride    . sodium chloride    . sodium chloride       LOS: 3 days     Georgette Shell, MD  If  7PM-7AM, please contact night-coverage [email or web address] Password [facility] 04/23/2017, 12:35 PM

## 2017-04-23 NOTE — Progress Notes (Signed)
Progress Note  Patient Name: [name] Date of Encounter: 04/23/2017  Primary Cardiologist: Dr. Minus Breeding  Subjective   Breathing is OK   No CP   Inpatient Medications    Scheduled Meds: . ambrisentan  5 mg Oral Daily  . aspirin EC  81 mg Oral Daily  . bisacodyl  10 mg Rectal Once  . cilostazol  100 mg Oral BID AC  . cinacalcet  90 mg Oral Q M,W,F-HD  . Darbepoetin Alfa  60 mcg Subcutaneous Q Mon-HD  . digoxin  0.125 mg Oral Once per day on Mon Wed Fri  . diltiazem  60 mg Oral TID  . diphenhydrAMINE  50 mg Oral Q M,W,F  . HYDROcodone-acetaminophen  1 tablet Oral Once  . isosorbide dinitrate  10 mg Oral TID  . lactulose  20 g Oral Once  . levETIRAcetam  500 mg Oral Q2000  . levothyroxine  150 mcg Oral QAC breakfast  . metoprolol succinate  100 mg Oral Daily  . multivitamin  1 tablet Oral QHS  . pantoprazole  40 mg Oral QHS  . pravastatin  40 mg Oral QHS  . ranolazine  500 mg Oral BID  . sevelamer carbonate  1,600 mg Oral TID WC  . sodium chloride flush  3 mL Intravenous Q12H  . Warfarin - Pharmacist Dosing Inpatient   Does not apply q1800   Continuous Infusions: . sodium chloride    . sodium chloride    . sodium chloride     PRN Meds: sodium chloride, sodium chloride, sodium chloride, acetaminophen, albuterol, alteplase, camphor-menthol, HYDROcodone-acetaminophen, lidocaine (PF), lidocaine-prilocaine, nitroGLYCERIN, ondansetron (ZOFRAN) IV, pentafluoroprop-tetrafluoroeth, polyethylene glycol, senna-docusate, sodium chloride flush, traZODone   Vital Signs    Vitals:   04/22/17 2056 04/22/17 2344 04/23/17 0321 04/23/17 0522  BP: (!) 108/57 109/67 106/62   Pulse:  (!) 108 88 (!) 104  Resp:  _0 Temp:  98.2 F (36.8 C) 97.8 F (36.6 C)   TempSrc:  Oral Oral   SpO2:  93% 95% 94%  Weight:    179 lb 4.8 oz (81.3 kg)  Height:        Intake/Output Summary (Last 24 hours) at 04/23/2017 0629 Last data filed at 04/22/2017 2047 Gross per 24 hour    Intake 960 ml  Output 0 ml  Net 960 ml   Filed Weights   04/21/17 0557 04/22/17 0543 04/23/17 0522  Weight: 173 lb 15.1 oz (78.9 kg) 175 lb 14.8 oz (79.8 kg) 179 lb 4.8 oz (81.3 kg)    Telemetry    .Atrial fib  90s   Personally reviewed.  Physical Exam   GEN: No acute distress.   Neck: JVP normal   Cardiac:  Irregularly irregular, 2/6 systolic murmur   Respiratory: . Clear to auscultation bilaterally. GI: Soft, nontender, bowel sounds present. Ext  No edeia Neuro:  Nonfocal. Psych: Alert and oriented x 3. Normal affect.  Labs    Chemistry Recent Labs  Lab 04/17/17 1026 04/18/17 0803 04/20/17 0730 04/21/17 0014  NA 132* 133* 137 137  K 4.4 4.5 4.4 3.4*  CL 95* 97* 98* 101  CO2 _1 GLUCOSE 103* 149* 98 108*  BUN 44* 39* 50* 26*  CREATININE 8.29* 7.30* 7.11* 4.05*  CALCIUM 8.9 9.1 9.8 9.2  PROT  --   --  6.4*  --   ALBUMIN 3.1* 3.0* 3.2*  --   AST  --   --  17  --  ALT  --   --  17  --   ALKPHOS  --   --  58  --   BILITOT  --   --  0.7  --   GFRNONAA 4* 5* 5* 10*  GFRAA 5* 6* 6* 12*  ANIONGAP 15 13 16* 11     Hematology Recent Labs  Lab 04/18/17 0847 04/20/17 0730 04/21/17 0014  WBC 4.9 6.1 6.3  RBC 3.25* 3.39* 3.26*  HGB 8.8* 9.4* 9.3*  HCT 27.3* 28.9* 27.3*  MCV 84.0 85.3 83.7  MCH 27.1 27.7 28.5  MCHC 32.2 32.5 34.1  RDW 18.8* 19.7* 19.8*  PLT 133* 129* 118*    Cardiac Enzymes Recent Labs  Lab 04/20/17 0730 04/20/17 1351 04/20/17 1824 04/21/17 0014  TROPONINI 0.26* 0.27* 0.31* 0.25*   No results for input(s): TROPIPOC in the last 168 hours.    Radiology    No results found.  Cardiac Studies   Echocardiogram 11/23/2016: Study Conclusions  - Left ventricle: The cavity size was normal. Wall thickness was increased in a pattern of mild LVH. Systolic function was normal. The estimated ejection fraction was in the range of 55% to 60%. Wall motion was normal; there were no regional wall motion abnormalities.  Doppler parameters are consistent with high ventricular filling pressure. - Aortic valve: There was mild stenosis. - Mitral valve: Severely calcified annulus. The findings are consistent with mild stenosis. There was mild regurgitation. Valve area by pressure half-time: 2.02 cm^2. - Left atrium: The atrium was severely dilated. - Right atrium: The atrium was mildly dilated. - Tricuspid valve: There was severe regurgitation. - Pulmonary arteries: Systolic pressure was severely increased. PA peak pressure: 80 mm Hg (S). - Pericardium, extracardiac: A small pericardial effusion was identified.  Impressions:  - Normal LV systolic function; mild LVH; calcified aortic valve with mild AS (mean gradient 12 mmHg); severe MAC with mild MS and mild MR; biatrial enlargment; moderate to severe TR withi severely elevated pulmonary pressure; small pericardial effusion.  Patient Profile     72 y.o. female with ESRD on HD, pulmonary HTN, essential HTN, HLD, CAD w/ previous RCA stenting, OSA and atrial fibrillationon warfarin.  Admitted 4/13 to 4/18 with SOB and C   Afib with RVR.  Anemic    Assessment & Plan    1.  Persistent atrial fibrillation  PT had not been taking meds HR is better   Keep on same regimen for now    2.  Elevated troponin I  flat pattern  3.  ESRD on hemodialysis.Getting this AM  4.  CAD Cath November 2018  No active angina   Signed, Dorris Carnes, MD  04/23/2017, 6:29 AM

## 2017-04-24 ENCOUNTER — Inpatient Hospital Stay (HOSPITAL_COMMUNITY): Payer: Medicare Other

## 2017-04-24 DIAGNOSIS — I4891 Unspecified atrial fibrillation: Secondary | ICD-10-CM

## 2017-04-24 LAB — PROTIME-INR
INR: 1.47
Prothrombin Time: 17.7 seconds — ABNORMAL HIGH (ref 11.4–15.2)

## 2017-04-24 LAB — ECHOCARDIOGRAM LIMITED
HEIGHTINCHES: 62 in
WEIGHTICAEL: 2804.8 [oz_av]

## 2017-04-24 MED ORDER — METOPROLOL TARTRATE 50 MG PO TABS
75.0000 mg | ORAL_TABLET | Freq: Two times a day (BID) | ORAL | Status: DC
  Administered 2017-04-24 – 2017-04-26 (×5): 75 mg via ORAL
  Filled 2017-04-24 (×5): qty 1

## 2017-04-24 MED ORDER — WARFARIN SODIUM 2.5 MG PO TABS
12.5000 mg | ORAL_TABLET | Freq: Once | ORAL | Status: AC
  Administered 2017-04-24: 12.5 mg via ORAL
  Filled 2017-04-24: qty 1

## 2017-04-24 NOTE — Care Management Important Message (Signed)
Important Message  Patient Details  Name: ROSABELL GEYER MRN: [id number] Date of Birth: 1945-06-02   Medicare Important Message Given:  Yes    Barb Merino Brenda Cowher 04/24/2017, 1:55 PM

## 2017-04-24 NOTE — Evaluation (Signed)
Occupational Therapy Evaluation Patient Details Name: [name] MRN: [id number] DOB: [birth year] Today's Date: 04/24/2017    History of Present Illness Pt is a 72 y/o female with medical history significant for end-stage renal disease on dialysis, hypertension, pulmonary hypertension, hyperlipidemia, CAD with previous RCA stenting, obstructive sleep apnea and atrial fibrillation on Coumadin presents to the emergency department with the chief complaint of chest pain. Initial evaluation reveals atrial fibrillation with rapid ventricular response.   Clinical Impression   Pt admitted with the above diagnosis and has the deficits listed below. Pt would benefit from cont OT to increase independence and safety with basic adls so she can return home alone with occasional supervision. Spoke with pt about always having a phone with her when she is up and about.  Pt fatigues quickly but with rest breaks does well.      Follow Up Recommendations  No OT follow up;Supervision/Assistance - 24 hour    Equipment Recommendations  None recommended by OT    Recommendations for Other Services       Precautions / Restrictions Precautions Precautions: Fall Precaution Comments: Pt falls once every 6 months or so Restrictions Weight Bearing Restrictions: No      Mobility Bed Mobility Overal bed mobility: Needs Assistance Bed Mobility: Supine to Sit;Sit to Supine           General bed mobility comments: pt in chair on arrival.  Transfers Overall transfer level: Needs assistance Equipment used: None Transfers: Sit to/from [employer] Transfers Sit to Stand: Supervision Stand pivot transfers: Min guard       General transfer comment: for safety    Balance Overall balance assessment: Needs assistance Sitting-balance support: Feet supported Sitting balance-Leahy Scale: Good     Standing balance support: During functional activity;No upper extremity supported Standing  balance-Leahy Scale: Fair Standing balance comment: Pt could stand for shorts amount of time without outside assist to bathe LEs.                           ADL either performed or assessed with clinical judgement   ADL Overall ADL's : Needs assistance/impaired Eating/Feeding: Independent;Sitting   Grooming: Wash/dry hands;Wash/dry face;Oral care;Set up;Standing;Supervision/safety   Upper Body Bathing: Set up;Sitting   Lower Body Bathing: Min guard;Sit to/from stand   Upper Body Dressing : Set up;Sitting   Lower Body Dressing: Min guard;Sit to/from stand Lower Body Dressing Details (indicate cue type and reason): Pt struggles with socks and shoes but is able to donn them without assist. Toilet Transfer: Min guard;Ambulation;Grab bars;Comfort height toilet   Toileting- Clothing Manipulation and Hygiene: Min guard;Sit to/from stand       Functional mobility during ADLs: Supervision/safety General ADL Comments: Pt does well but does require rest breaks every 5 minutes or so due to SOB     Vision Baseline Vision/History: Wears glasses Wears Glasses: At all times Patient Visual Report: No change from baseline Vision Assessment?: Yes Eye Alignment: Within Functional Limits Ocular Range of Motion: Within Functional Limits Alignment/Gaze Preference: Within Defined Limits Tracking/Visual Pursuits: Able to track stimulus in all quads without difficulty Saccades: Within functional limits Convergence: Within functional limits Visual Fields: No apparent deficits Additional Comments: Pt wears glasses at all times     Perception Perception Perception Tested?: No   Praxis Praxis Praxis tested?: Within functional limits    Pertinent Vitals/Pain Pain Assessment: No/denies pain     Hand Dominance Left   Extremity/Trunk Assessment Upper Extremity  Assessment Upper Extremity Assessment: Overall WFL for tasks assessed   Lower Extremity Assessment Lower Extremity  Assessment: Defer to PT evaluation   Cervical / Trunk Assessment Cervical / Trunk Assessment: Normal   Communication Communication Communication: No difficulties   Cognition Arousal/Alertness: Awake/alert Behavior During Therapy: WFL for tasks assessed/performed Overall Cognitive Status: Within Functional Limits for tasks assessed                                 General Comments: Pt states she falls once every 6 months   General Comments  Pt does well but requires supervision for safety and fatigue.    Exercises     Shoulder Instructions      Home Living Family/patient expects to be discharged to:: Private residence Living Arrangements: Alone Available Help at Discharge: Family;Friend(s);Available PRN/intermittently;Neighbor Type of Home: Apartment Home Access: Level entry     Home Layout: One level     Bathroom Shower/Tub: [place] unit;Curtain   Biochemist, clinical: [place]: Environmental consultant - 2 wheels;Cane - single point;Shower seat          Prior Functioning/Environment Level of Independence: Independent with assistive device(s)        Comments: pt ambulates with use of SPC PRN        OT Problem List: Decreased activity tolerance;Decreased knowledge of use of DME or AE;Cardiopulmonary status limiting activity      OT Treatment/Interventions: Self-care/ADL training;DME and/or AE instruction;Therapeutic activities    OT Goals(Current goals can be found in the care plan section) Acute Rehab OT Goals Patient Stated Goal: return home OT Goal Formulation: With patient Time For Goal Achievement: 05/08/17 Potential to Achieve Goals: Good ADL Goals Pt Will Perform Grooming: with modified independence;standing Pt Will Perform Lower Body Bathing: with supervision;sit to/from stand Pt Will Perform Lower Body Dressing: with supervision;sit to/from stand Pt Will Perform Tub/Shower Transfer: Tub transfer;3 in 1;ambulating;with  supervision Additional ADL Goal #1: Pt wil walk to bathroom and do all toileting tasks with modified I.  OT Frequency: Min 2X/week   Barriers to D/C: Decreased caregiver support  pt lives alone       Co-evaluation              AM-PAC PT "6 Clicks" Daily Activity     Outcome Measure Help from another person eating meals?: None Help from another person taking care of personal grooming?: None Help from another person toileting, which includes using toliet, bedpan, or urinal?: A Little Help from another person bathing (including washing, rinsing, drying)?: A Little Help from another person to put on and taking off regular upper body clothing?: A Little Help from another person to put on and taking off regular lower body clothing?: A Little 6 Click Score: 20   End of Session Equipment Utilized During Treatment: Gait belt Nurse Communication: Mobility status  Activity Tolerance: Patient tolerated treatment well Patient left: in chair;with call bell/phone within reach  OT Visit Diagnosis: Unsteadiness on feet (R26.81)                Time: 6734-1937 OT Time Calculation (min): 26 min Charges:  OT General Charges $OT Visit: 1 Visit OT Evaluation $OT Eval Moderate Complexity: 1 Mod OT Treatments $Self Care/Home Management : 8-22 mins G-Codes:     Jinger Neighbors, OTR/L [phone]  Glenford Peers 04/24/2017, 9:51 AM

## 2017-04-24 NOTE — Progress Notes (Signed)
Assessment/Plan: 1. A fib w/RVR -  Per cards 2. Recent cath drainage - sp catheter removal/ replacement last admission and ab.  3. ESRD - HD MWF.Next HD 4/24 No heparin due to allergy 4. Volume - still vol up w/ LE edema, ^UF goal on HD tomorrow. Vol control difficult due to  Intermittent problems with hypotension, high afib VR, and/or angina while on HD.  [name]5S/p new LUA AVG - placed on 4/4 by Dr. Bridgett Larsson, 4 wks will be 05/03/17  Subjective: Interval History: c/o leg swelling- weigh up in hospital  Objective: Vital signs in last 24 hours: Temp:  [97.6 F (36.4 C)-98.8 F (37.1 C)] 97.6 F (36.4 C) (04/23 1130) Pulse Rate:  [78-126] 110 (04/23 1130) Resp:  [17-30] 20 (04/23 1130) BP: (77-135)/(54-92) 105/63 (04/23 1130) SpO2:  [92 %-98 %] 92 % (04/23 1130) Weight:  [79.5 kg (175 lb 4.8 oz)] 79.5 kg (175 lb 4.8 oz) (04/23 0320) Weight change: 0.77 kg (1 lb 11.2 oz)  Intake/Output from previous day: 04/22 0701 - 04/23 0700 In: 480 [P.O.:480] Out: 3000  Intake/Output this shift: Total I/O In: 240 [P.O.:240] Out: -   General appearance: alert and cooperative Resp: clear to auscultation bilaterally Cardio: irregularly irregular rhythm GI: soft, non-tender; bowel sounds normal; no masses,  no organomegaly Extremities: edema pedal and ankle  Lab Results: Recent Labs    04/23/17 0728  WBC 6.1  HGB 9.1*  HCT 27.1*  PLT 131*   BMET:  Recent Labs    04/23/17 0728  NA 133*  K 5.6*  CL 97*  CO2 22  GLUCOSE 97  BUN 50*  CREATININE 8.30*  CALCIUM 9.3   No results for input(s): PTH in the last 72 hours. Iron Studies: No results for input(s): IRON, TIBC, TRANSFERRIN, FERRITIN in the last 72 hours. Studies/Results: No results found.  Scheduled: . ambrisentan  5 mg Oral Daily  . aspirin EC  81 mg Oral Daily  . bisacodyl  10 mg Oral Daily  . bisacodyl  10 mg Rectal Once  . cilostazol  100 mg Oral BID AC  . cinacalcet  90 mg Oral Q M,W,F-HD  . Darbepoetin Alfa  60 mcg  Subcutaneous Q Mon-HD  . digoxin  0.125 mg Oral Once per day on Mon Wed Fri  . diltiazem  60 mg Oral TID  . diphenhydrAMINE  50 mg Oral Q M,W,F  . HYDROcodone-acetaminophen  1 tablet Oral Once  . levETIRAcetam  500 mg Oral Q2000  . levothyroxine  150 mcg Oral QAC breakfast  . metoprolol tartrate  75 mg Oral BID  . multivitamin  1 tablet Oral QHS  . pantoprazole  40 mg Oral QHS  . pravastatin  40 mg Oral QHS  . ranolazine  500 mg Oral BID  . sevelamer carbonate  1,600 mg Oral TID WC  . sodium chloride flush  3 mL Intravenous Q12H  . warfarin  12.5 mg Oral ONCE-1800  . Warfarin - Pharmacist Dosing Inpatient   Does not apply q1800   Continuous: . sodium chloride    . sodium chloride    . sodium chloride        LOS: 4 days   [name] 04/24/2017,1:24 PM

## 2017-04-24 NOTE — Progress Notes (Addendum)
Physical Therapy Treatment Patient Details Name: [name] MRN: [id number] DOB: 07-19-1945 Today's Date: 04/24/2017    History of Present Illness Pt is a 72 y/o female with medical history significant for end-stage renal disease on dialysis, hypertension, pulmonary hypertension, hyperlipidemia, CAD with previous RCA stenting, obstructive sleep apnea and atrial fibrillation on Coumadin presents to the emergency department with the chief complaint of chest pain. Initial evaluation reveals atrial fibrillation with rapid ventricular response.    PT Comments    Pt in chair on arrival after OT and bath. Pt reports fatigue but willing to mobilize. Pt able to increase gait distance but continues to be limited by fatigue and balance. Pt requires UE support throughout gait for stability and fall prevention with education to maintain DME at all times with mobility. Will continue to follow as pt can tolerate to maximize gait and balance.   HR 115-126 during activity    Follow Up Recommendations  Home health PT;Supervision/Assistance - 24 hour     Equipment Recommendations  None recommended by PT    Recommendations for Other Services       Precautions / Restrictions Precautions Precautions: Fall Precaution Comments: Pt falls once every 6 months or so Restrictions Weight Bearing Restrictions: No    Mobility  Bed Mobility Overal bed mobility: Needs Assistance            General bed mobility comments: sitting EOB end of session, in chair on arrival  Transfers Overall transfer level: Needs assistance Equipment used: None Transfers: Sit to/from Stand Sit to Stand: Supervision Stand pivot transfers: Min guard       General transfer comment: supervision for safety and balance  Ambulation/Gait Ambulation/Gait assistance: Min guard;Min assist Ambulation Distance (Feet): 100 Feet Assistive device: 1 person hand held assist;Rolling walker (2 wheeled) Gait Pattern/deviations:  Step-through pattern;Wide base of support;Decreased stride length   Gait velocity interpretation: <1.8 ft/sec, indicate of risk for recurrent falls General Gait Details: pt with wide BOS with unsteady gait requiring min assist with HHA for gait, walked last 20' with RW with decreased assist and recommend using rollator at home for all mobility and cane if in close quarters. limited by fatigue   Stairs             Wheelchair Mobility    Modified Rankin (Stroke Patients Only)       Balance Overall balance assessment: Needs assistance Sitting-balance support: Feet supported Sitting balance-Leahy Scale: Good     Standing balance support: During functional activity;No upper extremity supported Standing balance-Leahy Scale: Fair Standing balance comment:                             Cognition Arousal/Alertness: Awake/alert Behavior During Therapy: WFL for tasks assessed/performed Overall Cognitive Status: Impaired/Different from baseline Area of Impairment: Safety/judgement                         Safety/Judgement: Decreased awareness of deficits     General Comments: Pt states she falls once every 6 months      Exercises      General Comments General comments (skin integrity, edema, etc.): Pt does well but requires supervision for safety and fatigue.      Pertinent Vitals/Pain Pain Assessment: No/denies pain    Home Living Family/patient expects to be discharged to:: Private residence Living Arrangements: Alone Available Help at Discharge: Family;Friend(s);Available PRN/intermittently;Neighbor Type of Home: Apartment Home Access: Level  entry   Home Layout: One level Home Equipment: [place] - 2 wheels;Cane - single point;Shower seat      Prior Function Level of Independence: Independent with assistive device(s)      Comments: pt ambulates with use of SPC PRN   PT Goals (current goals can now be found in the care plan section) Acute  Rehab PT Goals Patient Stated Goal: return home Progress towards PT goals: Progressing toward goals    Frequency           PT Plan Current plan remains appropriate    Co-evaluation              AM-PAC PT "6 Clicks" Daily Activity  Outcome Measure  Difficulty turning over in bed (including adjusting bedclothes, sheets and blankets)?: None Difficulty moving from lying on back to sitting on the side of the bed? : None Difficulty sitting down on and standing up from a chair with arms (e.g., wheelchair, bedside commode, etc,.)?: A Little Help needed moving to and from a bed to chair (including a wheelchair)?: A Little Help needed walking in hospital room?: A Little Help needed climbing 3-5 steps with a railing? : A Lot 6 Click Score: 19    End of Session Equipment Utilized During Treatment: Gait belt Activity Tolerance: Patient limited by fatigue Patient left: in bed;with call bell/phone within reach;with family/visitor present Nurse Communication: Mobility status PT Visit Diagnosis: Other abnormalities of gait and mobility (R26.89);Muscle weakness (generalized) (M62.81)     Time: 6962-9528 PT Time Calculation (min) (ACUTE ONLY): 12 min  Charges:  $Gait Training: 8-22 mins                    G Codes:       Elwyn Reach, PT [phone]    [place] 04/24/2017, 10:31 AM

## 2017-04-24 NOTE — Care Management Note (Signed)
Case Management Note  Patient Details  Name: [name] MRN: [id number] Date of Birth: 07/27/1945  Subjective/Objective: Pt is a readmit for A fib - pt was discharged on 4/18 and returned 4/19    Action/Plan: PTA independent from home.  Pt is ESRD on outpt HD - pt utlizes [facility] for appts and HD sessions.  Pt is active with [facility] - agency aware of admit.      Expected Discharge Date:                  Expected Discharge Plan:     In-House Referral:     Discharge planning Services     Post Acute Care Choice:    Choice offered to:     DME Arranged:    DME Agency:     HH Arranged:    HH Agency:     Status of Service:     If discussed at [employer], dates discussed:    Additional Comments: Pt informed CM that she fell asleep the night before readmission and therefore did not take evening dose of medications.   Maryclare Labrador, RN 04/24/2017, 2:18 PM

## 2017-04-24 NOTE — Progress Notes (Signed)
Progress Note  Patient Name: [name] Date of Encounter: 04/24/2017  Primary Cardiologist: Minus Breeding, MD   Subjective   Breathing is OK   No CP    Inpatient Medications    Scheduled Meds: . ambrisentan  5 mg Oral Daily  . aspirin EC  81 mg Oral Daily  . bisacodyl  10 mg Oral Daily  . bisacodyl  10 mg Rectal Once  . cilostazol  100 mg Oral BID AC  . cinacalcet  90 mg Oral Q M,W,F-HD  . Darbepoetin Alfa  60 mcg Subcutaneous Q Mon-HD  . digoxin  0.125 mg Oral Once per day on Mon Wed Fri  . diltiazem  60 mg Oral TID  . diphenhydrAMINE  50 mg Oral Q M,W,F  . HYDROcodone-acetaminophen  1 tablet Oral Once  . isosorbide dinitrate  10 mg Oral TID  . lactulose  20 g Oral Once  . levETIRAcetam  500 mg Oral Q2000  . levothyroxine  150 mcg Oral QAC breakfast  . metoprolol succinate  100 mg Oral Daily  . multivitamin  1 tablet Oral QHS  . pantoprazole  40 mg Oral QHS  . pravastatin  40 mg Oral QHS  . ranolazine  500 mg Oral BID  . sevelamer carbonate  1,600 mg Oral TID WC  . sodium chloride flush  3 mL Intravenous Q12H  . Warfarin - Pharmacist Dosing Inpatient   Does not apply q1800   Continuous Infusions: . sodium chloride    . sodium chloride    . sodium chloride     PRN Meds: sodium chloride, sodium chloride, sodium chloride, acetaminophen, albuterol, alteplase, camphor-menthol, HYDROcodone-acetaminophen, lactulose, lidocaine (PF), lidocaine-prilocaine, nitroGLYCERIN, ondansetron (ZOFRAN) IV, pentafluoroprop-tetrafluoroeth, polyethylene glycol, senna-docusate, sodium chloride flush, traZODone   Vital Signs    Vitals:   04/24/17 0208 04/24/17 0209 04/24/17 0320 04/24/17 0700  BP: 125/74 125/74 117/83 (!) 135/91  Pulse:  92 85 (!) 114  Resp:  (!) 25 (!) 30 (!) 21  Temp:   98.8 F (37.1 C) 98.2 F (36.8 C)  TempSrc:   Oral Oral  SpO2:  95% 95% 98%  Weight:   175 lb 4.8 oz (79.5 kg)   Height:        Intake/Output Summary (Last 24 hours) at 04/24/2017  0759 Last data filed at 04/24/2017 0531 Gross per 24 hour  Intake 480 ml  Output 3000 ml  Net -2520 ml   Filed Weights   04/23/17 0522 04/23/17 0720 04/24/17 0320  Weight: 179 lb 4.8 oz (81.3 kg) 181 lb (82.1 kg) 175 lb 4.8 oz (79.5 kg)    Telemetry    afib   80s to 110s   - Personally Reviewed  ECG    Physical Exam   GEN: No acute distress.   Neck: No JVD Cardiac:Irreg irreg   S1, S2  No S3   II/VI systolic murmu   I/VI diastolic murmur Respiratory: Clear to auscultation bilaterally. GI: Soft, nontender, non-distended  MS: No edema; No deformity. Neuro:  Nonfocal  Psych: Normal affect   Labs    Chemistry Recent Labs  Lab 04/18/17 0803 04/20/17 0730 04/21/17 0014 04/23/17 0728  NA 133* 137 137 133*  K 4.5 4.4 3.4* 5.6*  CL 97* 98* 101 97*  CO2 _0 GLUCOSE 149* 98 108* 97  BUN 39* 50* 26* 50*  CREATININE 7.30* 7.11* 4.05* 8.30*  CALCIUM 9.1 9.8 9.2 9.3  PROT  --  6.4*  --   --  ALBUMIN 3.0* 3.2*  --  3.1*  AST  --  17  --   --   ALT  --  17  --   --   ALKPHOS  --  58  --   --   BILITOT  --  0.7  --   --   GFRNONAA 5* 5* 10* 4*  GFRAA 6* 6* 12* 5*  ANIONGAP 13 16* 11 14     Hematology Recent Labs  Lab 04/20/17 0730 04/21/17 0014 04/23/17 0728  WBC 6.1 6.3 6.1  RBC 3.39* 3.26* 3.24*  HGB 9.4* 9.3* 9.1*  HCT 28.9* 27.3* 27.1*  MCV 85.3 83.7 83.6  MCH 27.7 28.5 28.1  MCHC 32.5 34.1 33.6  RDW 19.7* 19.8* 19.8*  PLT 129* 118* 131*    Cardiac Enzymes Recent Labs  Lab 04/20/17 0730 04/20/17 1351 04/20/17 1824 04/21/17 0014  TROPONINI 0.26* 0.27* 0.31* 0.25*   No results for input(s): TROPIPOC in the last 168 hours.   BNPNo results for input(s): BNP, PROBNP in the last 168 hours.   DDimer No results for input(s): DDIMER in the last 168 hours.   Radiology    No results found.  Cardiac Studies   Echocardiogram 11/23/2016: Study Conclusions  - Left ventricle: The cavity size was normal. Wall thickness was increased in a  pattern of mild LVH. Systolic function was normal. The estimated ejection fraction was in the range of 55% to 60%. Wall motion was normal; there were no regional wall motion abnormalities. Doppler parameters are consistent with high ventricular filling pressure. - Aortic valve: There was mild stenosis. - Mitral valve: Severely calcified annulus. The findings are consistent with mild stenosis. There was mild regurgitation. Valve area by pressure half-time: 2.02 cm^2. - Left atrium: The atrium was severely dilated. - Right atrium: The atrium was mildly dilated. - Tricuspid valve: There was severe regurgitation. - Pulmonary arteries: Systolic pressure was severely increased. PA peak pressure: 80 mm Hg (S). - Pericardium, extracardiac: A small pericardial effusion was identified.  Impressions:  - Normal LV systolic function; mild LVH; calcified aortic valve with mild AS (mean gradient 12 mmHg); severe MAC with mild MS and mild MR; biatrial enlargment; moderate to severe TR withi severely elevated pulmonary pressure; small pericardial effusion.     Patient Profile     72 y.o. female with hx of ESRD (on dialysis), HTN, pulm HTN, HL, CAD, OSA and atrial fib  Admitted with CP, afib with RVR    Assessment & Plan    1 Persistent atrial fib Rates are 80s to 110  Labile even just sitting INR is subtherapeutic  I have reviewed echo from Nov 2018  PAP 80 at time  Atria are large  Mild MS  Mild RV dysfunction    Note that she was in SR at time   With atrial size and valvular problems I am surprised.  WOuld be challenging to get rhythm control but may need to consider drug Rx (if INR becomes therapeutic  I have switched lopressor to 75 bid (tartrate)   On short acting dilt   I have stopped isordil to help BP     2  CAD   Minimal flat trop elev on admitt  No evid for active ischemia  3  Pulmonary HTN   On Letairis  Limitied echo for PAP, RV function    3  ESRD  On  dialysis.      4  Mild mitral stenosis    Ambulate to  eval HR response with walking around    For questions or updates, please contact [place] Please consult [email or web address] for contact info under Cardiology/STEMI.      Signed, Dorris Carnes, MD  04/24/2017, 7:59 AM

## 2017-04-24 NOTE — Progress Notes (Signed)
  Echocardiogram 2D Echocardiogram has been performed.  [name] 04/24/2017, 12:52 PM

## 2017-04-24 NOTE — Progress Notes (Signed)
ANTICOAGULATION CONSULT NOTE - [place] for warfarin Indication: atrial fibrillation  Labs: Recent Labs    04/22/17 0618 04/23/17 0206 04/23/17 0728 04/24/17 0246  HGB  --   --  9.1*  --   HCT  --   --  27.1*  --   PLT  --   --  131*  --   LABPROT 16.0* 17.0*  --  17.7*  INR 1.29 1.39  --  1.47  CREATININE  --   --  8.30*  --    Estimated Creatinine Clearance: 6 mL/min (A) (by C-G formula based on SCr of 8.3 mg/dL (H)).  Assessment: 72 yo female with AFib and reported non-compliance with warfarin since discharge 4/10. INR remains subtherapeutic at 1.47 but trending up after dose increase yesterday.  She is without noted bleeding complications and CBC remains stable.   Potential Drug/Drug interactions: 1.  ASA/Warfarin - The risk of bleeding, particularly gastrointestinal, may be increased by coadministration of warfarin with aspirin EC. However, use of low-dose aspirin with warfarin may provide benefit that outweighs the risk of minor bleeding.  2.  Levothyroxine/Warfarin - The hypoprothrombinemic effect of warfarin may be increased by levothyroxine.    Goal of Therapy:  INR 2-3 Monitor platelets by anticoagulation protocol: Yes  Plan:  -Warfarin 12.5 mg PO x1 tonight -Daily INR  Thank you Anette Guarneri, PharmD [phone] 04/24/2017

## 2017-04-24 NOTE — Progress Notes (Signed)
PROGRESS NOTE    [name]  OZD:[id number] DOB: December 23, 1945 DOA: 04/20/2017 PCP: Glendale Chard, MD   Brief Narrative:72 y.o.femalewith medical history significantfor end-stage renal disease on dialysis, hypertension, pulmonary hypertension, hyperlipidemia, CAD with previous RCA stenting, obstructive sleep apnea and atrial fibrillation on Coumadin presents to the emergency department with the chief complaint of chest pain. Initial evaluation reveals atrial fibrillation with rapid ventricular response. [facility] are asked to admit  Information is obtained from the patient and the chart. She reports she was discharged yesterday after a five-day hospitalization for atrial fib with RVR, elevated troponin without acute EKG changes, anemia, infection and dialysis catheter. She reports she at home yesterday afternoon went to bed. She did not feel well as she believes "I should not have come home". She reports she went to dialysis center today they referred her to the emergency department for a heart rate range 80-140. She denies headache dizziness syncope or near-syncope. She does endorse some intermittent chest pain and palpitations that she says are chronic. She denies shortness of breath diaphoresis nausea vomiting diarrhea. She denies any lower extremity edema cough fever chills. She reports she did not take her medication today she usually does this after dialysis. ED Course:in the emergency department she's afebrile with a heart rate in 147 is not hypoxic she is provided with Cardizem drip. At the time of admission rate 64 blood pressure 120/61     Assessment & Plan:   Principal Problem:   Atrial fibrillation with RVR (HCC) Active Problems:   Essential hypertension, malignant   Coronary atherosclerosis   Chronic diastolic heart failure (HCC)   ESRD   Depression   GERD (gastroesophageal reflux disease)   Epilepsy (HCC)   Dyslipidemia   Elevated troponin   A-fib  (HCC)   1]A. fib with RVR recurrent secondary to noncompliance to medications. Patient was discharged from the hospital on 18 and was readmitted from dialysis center with shortness of breath and elevated heart rate. she was started on Cardizem drip in the ER. Patient reported that she did not know that she had new medications to take at home. It is noted that her heart rate was difficult to control even during the last admission due to soft blood pressure. She is currently on digoxin 0.125 mg 3 times a week, Cardizem 60 mg 3 times daily, Isordil 20 mg 3 times a day, Toprol 75 mg bid, Ranexa 500 mg twice a day aspirin and Coumadin. Patient also has a history of pulmonary hypertension for which she takes ambrisentan.  She was on Isordil which has been DC'd by cardiology rate controlled.  Due to soft blood pressure and to maximize rate control.  She does already has home health set up.  Patient has been seen by PT and OT and recommends no rehab.  2]end-stage renal disease dialysis Monday Wednesday and Friday. She was unable to finish dialysis on the day of admission. Nephrologyfollowing. Patient had infected TDC right thigh which was removed 4/14 and a new TDC has been placed in the left thigh. She got her last dose of antibiotics vancomycin duringlast session.  3]anemia status post blood transfusion last admission. hemoglobin 9.1today stable.  4]history of hypertension but with soft blood pressure.  5]hypothyroidism continue Synthroid.  6]seizure disorder continue Keppra.  7]constipation-dulcolax     DVT prophylax is: Coumadin Code Status: DNR Family Communication: No family available Disposition Plan: Discharge home when INR therapeutic and when okay with cardiology.  She is allergic to heparin so  heparin cannot be given.  She is only on Coumadin. Consultants:  Cardiology, nephrology. Procedures: None Antimicrobials: None  Subjective: Feels well had bowel movements  last night denies shortness of breath or chest pain.   Objective: Vitals:   04/24/17 0208 04/24/17 0209 04/24/17 0320 04/24/17 0700  BP: 125/74 125/74 117/83 (!) 135/91  Pulse:  92 85 (!) 114  Resp:  (!) 25 (!) 30 (!) 21  Temp:   98.8 F (37.1 C) 98.2 F (36.8 C)  TempSrc:   Oral Oral  SpO2:  95% 95% 98%  Weight:   79.5 kg (175 lb 4.8 oz)   Height:        Intake/Output Summary (Last 24 hours) at 04/24/2017 0959 Last data filed at 04/24/2017 0800 Gross per 24 hour  Intake 720 ml  Output 3000 ml  Net -2280 ml   Filed Weights   04/23/17 0522 04/23/17 0720 04/24/17 0320  Weight: 81.3 kg (179 lb 4.8 oz) 82.1 kg (181 lb) 79.5 kg (175 lb 4.8 oz)    Examination:  General exam: Appears calm and comfortable  Respiratory system:  DECREASED breath sounds at the bases auscultation. Respiratory effort normal. Cardiovascular system: S1 & S2 heard, RRR. No JVD, murmurs, rubs, gallops or clicks. No pedal edema. Gastrointestinal system: Abdomen is nondistended, soft and nontender. No organomegaly or masses felt. Normal bowel sounds heard. Central nervous system: Alert and oriented. No focal neurological deficits. Extremities: Symmetric 5 x 5 power. Skin: No rashes, lesions or ulcers Psychiatry: Judgement and insight appear normal. Mood & affect appropriate.     Data Reviewed: I have personally reviewed following labs and imaging studies  CBC: Recent Labs  Lab 04/17/17 1026 04/18/17 0847 04/20/17 0730 04/21/17 0014 04/23/17 0728  WBC 4.6 4.9 6.1 6.3 6.1  NEUTROABS  --   --  4.0  --   --   HGB 9.6* 8.8* 9.4* 9.3* 9.1*  HCT 29.3* 27.3* 28.9* 27.3* 27.1*  MCV 82.8 84.0 85.3 83.7 83.6  PLT 112* 133* 129* 118* 237*   Basic Metabolic Panel: Recent Labs  Lab 04/17/17 1026 04/18/17 0803 04/20/17 0730 04/21/17 0014 04/23/17 0728  NA 132* 133* 137 137 133*  K 4.4 4.5 4.4 3.4* 5.6*  CL 95* 97* 98* 101 97*  CO2 22 23 23 25 22   GLUCOSE 103* 149* 98 108* 97  BUN 44* 39* 50* 26*  50*  CREATININE 8.29* 7.30* 7.11* 4.05* 8.30*  CALCIUM 8.9 9.1 9.8 9.2 9.3  PHOS 6.2* 4.9*  --   --  5.7*   GFR: Estimated Creatinine Clearance: 6 mL/min (A) (by C-G formula based on SCr of 8.3 mg/dL (H)). Liver Function Tests: Recent Labs  Lab 04/17/17 1026 04/18/17 0803 04/20/17 0730 04/23/17 0728  AST  --   --  17  --   ALT  --   --  17  --   ALKPHOS  --   --  58  --   BILITOT  --   --  0.7  --   PROT  --   --  6.4*  --   ALBUMIN 3.1* 3.0* 3.2* 3.1*   No results for input(s): LIPASE, AMYLASE in the last 168 hours. No results for input(s): AMMONIA in the last 168 hours. Coagulation Profile: Recent Labs  Lab 04/20/17 0730 04/21/17 0014 04/22/17 0618 04/23/17 0206 04/24/17 0246  INR 1.26 1.45 1.29 1.39 1.47   Cardiac Enzymes: Recent Labs  Lab 04/17/17 1620 04/20/17 0730 04/20/17 1351 04/20/17 1824 04/21/17 0014  TROPONINI 0.33* 0.26* 0.27* 0.31* 0.25*   BNP (last 3 results) No results for input(s): PROBNP in the last 8760 hours. HbA1C: No results for input(s): HGBA1C in the last 72 hours. CBG: No results for input(s): GLUCAP in the last 168 hours. Lipid Profile: No results for input(s): CHOL, HDL, LDLCALC, TRIG, CHOLHDL, LDLDIRECT in the last 72 hours. Thyroid Function Tests: No results for input(s): TSH, T4TOTAL, FREET4, T3FREE, THYROIDAB in the last 72 hours. Anemia Panel: No results for input(s): VITAMINB12, FOLATE, FERRITIN, TIBC, IRON, RETICCTPCT in the last 72 hours. Sepsis Labs: No results for input(s): PROCALCITON, LATICACIDVEN in the last 168 hours.  Recent Results (from the past 240 hour(s))  Aerobic Culture (superficial specimen)     Status: None (Preliminary result)   Collection Time: 04/14/17  6:45 PM  Result Value Ref Range Status   Specimen Description WOUND RIGHT LEG  Final   Special Requests Immunocompromised  Final   Gram Stain   Final    RARE WBC PRESENT, PREDOMINANTLY PMN NO ORGANISMS SEEN    Culture   Final    RARE  MOLD ISOLATE REFERRED FOR ID ONLY Performed at [facility], [address], [place], [place] [address]    Report Status PENDING  Incomplete  Culture, blood (routine x 2)     Status: None   Collection Time: 04/14/17  7:15 PM  Result Value Ref Range Status   Specimen Description BLOOD HEMODIALYSIS CATHETER  Final   Special Requests   Final    BOTTLES DRAWN AEROBIC AND ANAEROBIC Blood Culture results may not be optimal due to an excessive volume of blood received in culture bottles   Culture   Final    NO GROWTH 5 DAYS Performed at [facility], [place] [address], [place], [place] [address]    Report Status 04/19/2017 FINAL  Final  Culture, blood (routine x 2)     Status: None   Collection Time: 04/14/17  7:20 PM  Result Value Ref Range Status   Specimen Description BLOOD HEMODIALYSIS CATHETER  Final   Special Requests   Final    BOTTLES DRAWN AEROBIC AND ANAEROBIC Blood Culture results may not be optimal due to an excessive volume of blood received in culture bottles   Culture   Final    NO GROWTH 5 DAYS Performed at [facility], [place] [address] [place], [place] [address]    Report Status 04/19/2017 FINAL  Final  MRSA PCR Screening     Status: None   Collection Time: 04/15/17  1:06 AM  Result Value Ref Range Status   MRSA by PCR NEGATIVE NEGATIVE Final    Comment:        The GeneXpert MRSA Assay (FDA approved for NASAL specimens only), is one component of a comprehensive MRSA colonization surveillance program. It is not intended to diagnose MRSA infection nor to guide or monitor treatment for MRSA infections. Performed at [facility], [place] [address] [place],  [address]   Culture, blood (Routine X 2) w Reflex to ID Panel     Status: None   Collection Time: 04/16/17 10:14 AM  Result Value Ref Range Status   Specimen Description BLOOD RIGHT ARM  Final   Special Requests   Final    BOTTLES DRAWN AEROBIC AND ANAEROBIC Blood Culture adequate  volume   Culture   Final    NO GROWTH 5 DAYS Performed at [facility], [place] [address] [place], [place]  [address]    Report Status 04/21/2017 FINAL  Final  Culture, blood (Routine X 2) w Reflex to ID Panel     Status: None   Collection Time: 04/16/17 10:21 AM  Result Value Ref Range Status   Specimen Description BLOOD RIGHT HAND  Final   Special Requests   Final    BOTTLES DRAWN AEROBIC AND ANAEROBIC Blood Culture adequate volume   Culture   Final    NO GROWTH 5 DAYS Performed at [facility], [place] [address] [place], [place] [address]    Report Status 04/21/2017 FINAL  Final  Surgical pcr screen     Status: None   Collection Time: 04/17/17  2:22 AM  Result Value Ref Range Status   MRSA, PCR NEGATIVE NEGATIVE Final   Staphylococcus aureus NEGATIVE NEGATIVE Final    Comment: (NOTE) The Xpert SA Assay (FDA approved for NASAL specimens in patients 55 years of age and older), is one component of a comprehensive surveillance program. It is not intended to diagnose infection nor to guide or monitor treatment. Performed at [facility], [place] [address] [place], [place] [address]   MRSA PCR Screening     Status: None   Collection Time: 04/20/17  4:39 PM  Result Value Ref Range Status   MRSA by PCR NEGATIVE NEGATIVE Final    Comment:        The GeneXpert MRSA Assay (FDA approved for NASAL specimens only), is one component of a comprehensive MRSA colonization surveillance program. It is not intended to diagnose MRSA infection nor to guide or monitor treatment for MRSA infections. Performed at [facility], [place] [address] [place], [place] [address]          Radiology Studies: No results found.      Scheduled Meds: . ambrisentan  5 mg Oral Daily  . aspirin EC  81 mg Oral Daily  . bisacodyl  10 mg Oral Daily  . bisacodyl  10 mg Rectal Once  . cilostazol  100 mg Oral BID AC  . cinacalcet  90 mg Oral Q M,W,F-HD  . Darbepoetin Alfa  60 mcg  Subcutaneous Q Mon-HD  . digoxin  0.125 mg Oral Once per day on Mon Wed Fri  . diltiazem  60 mg Oral TID  . diphenhydrAMINE  50 mg Oral Q M,W,F  . HYDROcodone-acetaminophen  1 tablet Oral Once  . lactulose  20 g Oral Once  . levETIRAcetam  500 mg Oral Q2000  . levothyroxine  150 mcg Oral QAC breakfast  . metoprolol tartrate  75 mg Oral BID  . multivitamin  1 tablet Oral QHS  . pantoprazole  40 mg Oral QHS  . pravastatin  40 mg Oral QHS  . ranolazine  500 mg Oral BID  . sevelamer carbonate  1,600 mg Oral TID WC  . sodium chloride flush  3 mL Intravenous Q12H  . warfarin  12.5 mg Oral ONCE-1800  . Warfarin - Pharmacist Dosing Inpatient   Does not apply q1800   Continuous Infusions: . sodium chloride    . sodium chloride    . sodium chloride       LOS: 4 days     Georgette Shell, MD [facility] If 7PM-7AM, please contact night-coverage [email or web address] Password [facility] 04/24/2017, 9:59 AM

## 2017-04-25 ENCOUNTER — Ambulatory Visit: Payer: Medicare Other | Admitting: Physician Assistant

## 2017-04-25 LAB — CBC
HEMATOCRIT: 29.8 % — AB (ref 36.0–46.0)
HEMOGLOBIN: 9.6 g/dL — AB (ref 12.0–15.0)
MCH: 27.6 pg (ref 26.0–34.0)
MCHC: 32.2 g/dL (ref 30.0–36.0)
MCV: 85.6 fL (ref 78.0–100.0)
Platelets: 122 10*3/uL — ABNORMAL LOW (ref 150–400)
RBC: 3.48 MIL/uL — AB (ref 3.87–5.11)
RDW: 19.9 % — ABNORMAL HIGH (ref 11.5–15.5)
WBC: 5.2 10*3/uL (ref 4.0–10.5)

## 2017-04-25 LAB — RENAL FUNCTION PANEL
Albumin: 3 g/dL — ABNORMAL LOW (ref 3.5–5.0)
Anion gap: 12 (ref 5–15)
BUN: 39 mg/dL — ABNORMAL HIGH (ref 6–20)
CHLORIDE: 93 mmol/L — AB (ref 101–111)
CO2: 26 mmol/L (ref 22–32)
Calcium: 9.5 mg/dL (ref 8.9–10.3)
Creatinine, Ser: 7.36 mg/dL — ABNORMAL HIGH (ref 0.44–1.00)
GFR calc Af Amer: 6 mL/min — ABNORMAL LOW (ref >60)
GFR, EST NON AFRICAN AMERICAN: 5 mL/min — AB (ref >60)
Glucose, Bld: 87 mg/dL (ref 65–99)
POTASSIUM: 4.9 mmol/L (ref 3.5–5.1)
Phosphorus: 5.3 mg/dL — ABNORMAL HIGH (ref 2.5–4.6)
Sodium: 131 mmol/L — ABNORMAL LOW (ref 135–145)

## 2017-04-25 LAB — PROTIME-INR
INR: 1.8
Prothrombin Time: 20.7 seconds — ABNORMAL HIGH (ref 11.4–15.2)

## 2017-04-25 MED ORDER — ALTEPLASE 2 MG IJ SOLR: 2.0000 mg | Freq: Once | INTRAMUSCULAR | Status: DC | PRN

## 2017-04-25 MED ORDER — SODIUM CHLORIDE 0.9 % IV SOLN: 100.0000 mL | INTRAVENOUS | Status: DC | PRN

## 2017-04-25 MED ORDER — LIDOCAINE-PRILOCAINE 2.5-2.5 % EX CREA: 1.0000 "application " | TOPICAL_CREAM | CUTANEOUS | Status: DC | PRN

## 2017-04-25 MED ORDER — WARFARIN SODIUM 10 MG PO TABS
10.0000 mg | ORAL_TABLET | Freq: Once | ORAL | Status: AC
  Administered 2017-04-25: 10 mg via ORAL
  Filled 2017-04-25: qty 1

## 2017-04-25 MED ORDER — PENTAFLUOROPROP-TETRAFLUOROETH EX AERO: 1.0000 "application " | INHALATION_SPRAY | CUTANEOUS | Status: DC | PRN

## 2017-04-25 MED ORDER — LIDOCAINE HCL (PF) 1 % IJ SOLN: 5.0000 mL | INTRAMUSCULAR | Status: DC | PRN

## 2017-04-25 MED ORDER — DIPHENHYDRAMINE HCL 25 MG PO CAPS
ORAL_CAPSULE | ORAL | Status: AC
  Filled 2017-04-25: qty 2

## 2017-04-25 MED ORDER — HEPARIN SODIUM (PORCINE) 1000 UNIT/ML DIALYSIS: 1000.0000 [IU] | INTRAMUSCULAR | Status: DC | PRN

## 2017-04-25 NOTE — Progress Notes (Signed)
PROGRESS NOTE    [name]  YTK:[id number] DOB: [birth year] DOA: 04/20/2017 PCP: Glendale Chard, MD  Brief Narrative:71 y.o.femalewith medical history significantfor end-stage renal disease on dialysis, hypertension, pulmonary hypertension, hyperlipidemia, CAD with previous RCA stenting, obstructive sleep apnea and atrial fibrillation on Coumadin presents to the emergency department with the chief complaint of chest pain. Initial evaluation reveals atrial fibrillation with rapid ventricular response. [facility] are asked to admit  Information is obtained from the patient and the chart. She reports she was discharged yesterday after a five-day hospitalization for atrial fib with RVR, elevated troponin without acute EKG changes, anemia, infection and dialysis catheter. She reports she at home yesterday afternoon went to bed. She did not feel well as she believes "I should not have come home". She reports she went to dialysis center today they referred her to the emergency department for a heart rate range 80-140. She denies headache dizziness syncope or near-syncope. She does endorse some intermittent chest pain and palpitations that she says are chronic. She denies shortness of breath diaphoresis nausea vomiting diarrhea. She denies any lower extremity edema cough fever chills. She reports she did not take her medication today she usually does this after dialysis. ED Course:in the emergency department she's afebrile with a heart rate in 147 is not hypoxic she is provided with Cardizem drip. At the time of admission rate 64 blood pressure 120/61     Assessment & Plan:   Principal Problem:   Atrial fibrillation with RVR (HCC) Active Problems:   Essential hypertension, malignant   Coronary atherosclerosis   Chronic diastolic heart failure (HCC)   ESRD   Depression   GERD (gastroesophageal reflux disease)   Epilepsy (HCC)   Dyslipidemia   Elevated troponin   A-fib  (HCC)  1]A. fib with RVR recurrent secondary to noncompliance to medications. Patient was discharged from the hospital on 18 and was readmitted from dialysis center with shortness of breath and elevated heart rate. she was started on Cardizem drip in the ER. Patient reported that she did not know that she had new medications to take at home. It is noted that her heart rate was difficult to control even during the last admission due to soft blood pressure. She is currently on digoxin 0.125 mg 3 times a week, Cardizem 60 mg 3 times daily Toprol 75 mg bid, Ranexa 500 mg twice a day aspirin and Coumadin. Patient also has a history of pulmonary hypertension for which she takes ambrisentan.  She was on Isordil which has been DC'd by cardiology in order to better control the heart rate and to optimize rate control medications.  She does already has home health set up.  Patient has been seen by PT and OT and recommends no rehab.  Her INR is 1.80.  Today.  2]end-stage renal disease dialysis Monday Wednesday and Friday. She was unable to finish dialysis on the day of admission. Nephrologyfollowing. Patient had infected TDC right thigh which was removed 4/14 and a new TDC has been placed in the left thigh. She got her last dose of antibiotics vancomycin duringlast session.  3]anemia status post blood transfusion last admission. hemoglobin 9.1today stable.  4]history of hypertension but with soft blood pressure.  5]hypothyroidism continue Synthroid.  6]seizure disorder continue Keppra.  7]constipation-dulcolax     DVT prophylaxis: coumadin Code Status:dnr Family Communication: none Disposition Plan:tbd Consultants:  Cardiology and nephrology Procedures: None  antimicrobials: None Subjective: Denies any shortness of breath or chest pain.  Slept well.  Had bowel movement.   Objective: Vitals:   04/25/17 1030 04/25/17 1100 04/25/17 1128 04/25/17 1158  BP: (!) 104/50 (!)  110/52 (!) 111/56 (!) 109/57  Pulse: 80 83 84 (!) 110  Resp:   18   Temp:   98 F (36.7 C) 97.8 F (36.6 C)  TempSrc:   Oral Oral  SpO2:   98% 94%  Weight:   76.5 kg (168 lb 10.4 oz)   Height:        Intake/Output Summary (Last 24 hours) at 04/25/2017 1243 Last data filed at 04/25/2017 1128 Gross per 24 hour  Intake 720 ml  Output 3500 ml  Net -2780 ml   Filed Weights   04/25/17 0601 04/25/17 0728 04/25/17 1128  Weight: 79.4 kg (175 lb 1.6 oz) 79.8 kg (175 lb 14.8 oz) 76.5 kg (168 lb 10.4 oz)    Examination:  General exam: Appears calm and comfortable  Respiratory system: decreased breath sounds b/l bases auscultation. Respiratory effort normal. Cardiovascular system: S1 & S2 heard, RRR. No JVD, murmurs, rubs, gallops or clicks. No pedal edema. Gastrointestinal system: Abdomen is nondistended, soft and nontender. No organomegaly or masses felt. Normal bowel sounds heard. Central nervous system: Alert and oriented. No focal neurological deficits. Extremities edema 2 plus Skin: No rashes, lesions or ulcers Psychiatry: Judgement and insight appear normal. Mood & affect appropriate.     Data Reviewed: I have personally reviewed following labs and imaging studies  CBC: Recent Labs  Lab 04/20/17 0730 04/21/17 0014 04/23/17 0728 04/25/17 0817  WBC 6.1 6.3 6.1 5.2  NEUTROABS 4.0  --   --   --   HGB 9.4* 9.3* 9.1* 9.6*  HCT 28.9* 27.3* 27.1* 29.8*  MCV 85.3 83.7 83.6 85.6  PLT 129* 118* 131* 132*   Basic Metabolic Panel: Recent Labs  Lab 04/20/17 0730 04/21/17 0014 04/23/17 0728 04/25/17 0816  NA 137 137 133* 131*  K 4.4 3.4* 5.6* 4.9  CL 98* 101 97* 93*  CO2 23 25 22 26   GLUCOSE 98 108* 97 87  BUN 50* 26* 50* 39*  CREATININE 7.11* 4.05* 8.30* 7.36*  CALCIUM 9.8 9.2 9.3 9.5  PHOS  --   --  5.7* 5.3*   GFR: Estimated Creatinine Clearance: 6.6 mL/min (A) (by C-G formula based on SCr of 7.36 mg/dL (H)). Liver Function Tests: Recent Labs  Lab 04/20/17 0730  04/23/17 0728 04/25/17 0816  AST 17  --   --   ALT 17  --   --   ALKPHOS 58  --   --   BILITOT 0.7  --   --   PROT 6.4*  --   --   ALBUMIN 3.2* 3.1* 3.0*   No results for input(s): LIPASE, AMYLASE in the last 168 hours. No results for input(s): AMMONIA in the last 168 hours. Coagulation Profile: Recent Labs  Lab 04/21/17 0014 04/22/17 0618 04/23/17 0206 04/24/17 0246 04/25/17 0200  INR 1.45 1.29 1.39 1.47 1.80   Cardiac Enzymes: Recent Labs  Lab 04/20/17 0730 04/20/17 1351 04/20/17 1824 04/21/17 0014  TROPONINI 0.26* 0.27* 0.31* 0.25*   BNP (last 3 results) No results for input(s): PROBNP in the last 8760 hours. HbA1C: No results for input(s): HGBA1C in the last 72 hours. CBG: No results for input(s): GLUCAP in the last 168 hours. Lipid Profile: No results for input(s): CHOL, HDL, LDLCALC, TRIG, CHOLHDL, LDLDIRECT in the last 72 hours. Thyroid Function Tests: No results for input(s): TSH, T4TOTAL, FREET4, T3FREE, THYROIDAB in the  last 72 hours. Anemia Panel: No results for input(s): VITAMINB12, FOLATE, FERRITIN, TIBC, IRON, RETICCTPCT in the last 72 hours. Sepsis Labs: No results for input(s): PROCALCITON, LATICACIDVEN in the last 168 hours.  Recent Results (from the past 240 hour(s))  Culture, blood (Routine X 2) w Reflex to ID Panel     Status: None   Collection Time: 04/16/17 10:14 AM  Result Value Ref Range Status   Specimen Description BLOOD RIGHT ARM  Final   Special Requests   Final    BOTTLES DRAWN AEROBIC AND ANAEROBIC Blood Culture adequate volume   Culture   Final    NO GROWTH 5 DAYS Performed at [facility], [address], [place], [place] [address]    Report Status 04/21/2017 FINAL  Final  Culture, blood (Routine X 2) w Reflex to ID Panel     Status: None   Collection Time: 04/16/17 10:21 AM  Result Value Ref Range Status   Specimen Description BLOOD RIGHT HAND  Final   Special Requests   Final    BOTTLES DRAWN AEROBIC AND ANAEROBIC  Blood Culture adequate volume   Culture   Final    NO GROWTH 5 DAYS Performed at [facility], [place] [address] [place], [place] [address]    Report Status 04/21/2017 FINAL  Final  Surgical pcr screen     Status: None   Collection Time: 04/17/17  2:22 AM  Result Value Ref Range Status   MRSA, PCR NEGATIVE NEGATIVE Final   Staphylococcus aureus NEGATIVE NEGATIVE Final    Comment: (NOTE) The Xpert SA Assay (FDA approved for NASAL specimens in patients 39 years of age and older), is one component of a comprehensive surveillance program. It is not intended to diagnose infection nor to guide or monitor treatment. Performed at [facility], [place] [address], [place], [place] [address]   MRSA PCR Screening     Status: None   Collection Time: 04/20/17  4:39 PM  Result Value Ref Range Status   MRSA by PCR NEGATIVE NEGATIVE Final    Comment:        The GeneXpert MRSA Assay (FDA approved for NASAL specimens only), is one component of a comprehensive MRSA colonization surveillance program. It is not intended to diagnose MRSA infection nor to guide or monitor treatment for MRSA infections. Performed at [facility], [place] [address], [place], [place] [address]          Radiology Studies: No results found.      Scheduled Meds: . ambrisentan  5 mg Oral Daily  . aspirin EC  81 mg Oral Daily  . bisacodyl  10 mg Oral Daily  . bisacodyl  10 mg Rectal Once  . cilostazol  100 mg Oral BID AC  . cinacalcet  90 mg Oral Q M,W,F-HD  . Darbepoetin Alfa  60 mcg Subcutaneous Q Mon-HD  . digoxin  0.125 mg Oral Once per day on Mon Wed Fri  . diltiazem  60 mg Oral TID  . diphenhydrAMINE  50 mg Oral Q M,W,F  . HYDROcodone-acetaminophen  1 tablet Oral Once  . levETIRAcetam  500 mg Oral Q2000  . levothyroxine  150 mcg Oral QAC breakfast  . metoprolol tartrate  75 mg Oral BID  . multivitamin  1 tablet Oral QHS  . pantoprazole  40 mg Oral QHS  . pravastatin  40 mg Oral QHS   . ranolazine  500 mg Oral BID  . sevelamer carbonate  1,600 mg Oral TID WC  . sodium chloride flush  3 mL Intravenous Q12H  . Warfarin - Pharmacist Dosing Inpatient   Does not apply q1800   Continuous Infusions: . sodium chloride       LOS: 5 days     Georgette Shell, MD [facility] If 7PM-7AM, please contact night-coverage [email or web address] Password [facility] 04/25/2017, 12:43 PM

## 2017-04-25 NOTE — Procedures (Signed)
Attempting 4000cc as has evidence of volume overload.  Weight 79.8 kg.  77 on admission, if real. Afib on exam, BP soft, BFR 400. Sarah Span [name],MD

## 2017-04-25 NOTE — Plan of Care (Signed)
Patient is progressing toward all care goals.  Remains in atrial fibrillation but w/controlled rate in the 70-80s.  Patient ambulates to the bathroom w/SBA x 1 and rolling walker safely.  No complaints throughout this afternoon.  Tolerated HD this AM without difficulty.

## 2017-04-25 NOTE — Progress Notes (Signed)
Patient transported to dialysis for treatment - remains on monitor.

## 2017-04-25 NOTE — Progress Notes (Signed)
ANTICOAGULATION CONSULT NOTE - [place] for warfarin Indication: atrial fibrillation  Labs: Recent Labs    04/23/17 0206 04/23/17 0728 04/24/17 0246 04/25/17 0200 04/25/17 0816 04/25/17 0817  HGB  --  9.1*  --   --   --  9.6*  HCT  --  27.1*  --   --   --  29.8*  PLT  --  131*  --   --   --  122*  LABPROT 17.0*  --  17.7* 20.7*  --   --   INR 1.39  --  1.47 1.80  --   --   CREATININE  --  8.30*  --   --  7.36*  --    Estimated Creatinine Clearance: 6.6 mL/min (A) (by C-G formula based on SCr of 7.36 mg/dL (H)).  Assessment: 73 yo female with AFib and reported non-compliance with warfarin since discharge 4/10.   INR = 1.8  She is without noted bleeding complications and CBC remains stable.   Potential Drug/Drug interactions: 1.  ASA/Warfarin - The risk of bleeding, particularly gastrointestinal, may be increased by coadministration of warfarin with aspirin EC. However, use of low-dose aspirin with warfarin may provide benefit that outweighs the risk of minor bleeding.  2.  Levothyroxine/Warfarin - The hypoprothrombinemic effect of warfarin may be increased by levothyroxine.    Goal of Therapy:  INR 2-3 Monitor platelets by anticoagulation protocol: Yes  Plan:  -Warfarin 10 mg PO x1 tonight -Daily INR  Thank you Anette Guarneri, PharmD [phone] 04/25/2017

## 2017-04-26 ENCOUNTER — Encounter (HOSPITAL_COMMUNITY): Payer: Self-pay

## 2017-04-26 LAB — PROTIME-INR
INR: 2.02
Prothrombin Time: 22.7 seconds — ABNORMAL HIGH (ref 11.4–15.2)

## 2017-04-26 MED ORDER — WARFARIN SODIUM 2.5 MG PO TABS: ORAL_TABLET | ORAL | 1 refills | Status: DC

## 2017-04-26 MED ORDER — BISACODYL 5 MG PO TBEC: 10.0000 mg | DELAYED_RELEASE_TABLET | Freq: Every day | ORAL | 0 refills | Status: DC

## 2017-04-26 MED ORDER — SODIUM CHLORIDE 0.9 % IV SOLN: 100.0000 mL | INTRAVENOUS | Status: DC | PRN

## 2017-04-26 MED ORDER — LIDOCAINE HCL (PF) 1 % IJ SOLN: 5.0000 mL | INTRAMUSCULAR | Status: DC | PRN

## 2017-04-26 MED ORDER — WARFARIN SODIUM 7.5 MG PO TABS: 7.5000 mg | ORAL_TABLET | ORAL | Status: DC

## 2017-04-26 MED ORDER — POLYETHYLENE GLYCOL 3350 17 G PO PACK: 17.0000 g | PACK | Freq: Every day | ORAL | 0 refills | Status: DC | PRN

## 2017-04-26 MED ORDER — CAMPHOR-MENTHOL 0.5-0.5 % EX LOTN: TOPICAL_LOTION | CUTANEOUS | 0 refills | Status: DC | PRN

## 2017-04-26 MED ORDER — HEPARIN SODIUM (PORCINE) 1000 UNIT/ML DIALYSIS: 1000.0000 [IU] | INTRAMUSCULAR | Status: DC | PRN

## 2017-04-26 MED ORDER — DARBEPOETIN ALFA 60 MCG/0.3ML IJ SOSY: 60.0000 ug | PREFILLED_SYRINGE | INTRAMUSCULAR | 0 refills | Status: DC

## 2017-04-26 MED ORDER — WARFARIN SODIUM 10 MG PO TABS: 10.0000 mg | ORAL_TABLET | ORAL | Status: DC

## 2017-04-26 MED ORDER — DIPHENHYDRAMINE HCL 25 MG PO CAPS
ORAL_CAPSULE | ORAL | Status: AC
  Administered 2017-04-26: 50 mg via ORAL
  Filled 2017-04-26: qty 1

## 2017-04-26 MED ORDER — LIDOCAINE-PRILOCAINE 2.5-2.5 % EX CREA: 1.0000 "application " | TOPICAL_CREAM | CUTANEOUS | Status: DC | PRN

## 2017-04-26 MED ORDER — ALTEPLASE 2 MG IJ SOLR: 2.0000 mg | Freq: Once | INTRAMUSCULAR | Status: DC | PRN

## 2017-04-26 MED ORDER — METOPROLOL TARTRATE 75 MG PO TABS: 75.0000 mg | ORAL_TABLET | Freq: Two times a day (BID) | ORAL | 0 refills | Status: DC

## 2017-04-26 MED ORDER — WARFARIN SODIUM 10 MG PO TABS
10.0000 mg | ORAL_TABLET | Freq: Once | ORAL | Status: AC
  Administered 2017-04-26: 10 mg via ORAL
  Filled 2017-04-26: qty 1

## 2017-04-26 MED ORDER — PENTAFLUOROPROP-TETRAFLUOROETH EX AERO: 1.0000 "application " | INHALATION_SPRAY | CUTANEOUS | Status: DC | PRN

## 2017-04-26 NOTE — Progress Notes (Signed)
Discharge note. Patient and daughter educated at bedside. RN educated on medications and when to take them, follow-up appointments, diet and activity recommendations, when to call the MD, and when to seek immediate medical attention.   PIV removed without complications.   Pt discharged by wheelchair with NT.

## 2017-04-26 NOTE — Progress Notes (Signed)
Progress Note  Patient Name: [name] Date of Encounter: 04/26/2017  Primary Cardiologist: Minus Breeding, MD   Subjective  No SOB  No CP    Inpatient Medications    Scheduled Meds: . ambrisentan  5 mg Oral Daily  . aspirin EC  81 mg Oral Daily  . bisacodyl  10 mg Oral Daily  . bisacodyl  10 mg Rectal Once  . cilostazol  100 mg Oral BID AC  . cinacalcet  90 mg Oral Q M,W,F-HD  . Darbepoetin Alfa  60 mcg Subcutaneous Q Mon-HD  . digoxin  0.125 mg Oral Once per day on Mon Wed Fri  . diltiazem  60 mg Oral TID  . diphenhydrAMINE  50 mg Oral Q M,W,F  . HYDROcodone-acetaminophen  1 tablet Oral Once  . levETIRAcetam  500 mg Oral Q2000  . levothyroxine  150 mcg Oral QAC breakfast  . metoprolol tartrate  75 mg Oral BID  . multivitamin  1 tablet Oral QHS  . pantoprazole  40 mg Oral QHS  . pravastatin  40 mg Oral QHS  . ranolazine  500 mg Oral BID  . sevelamer carbonate  1,600 mg Oral TID WC  . sodium chloride flush  3 mL Intravenous Q12H  . Warfarin - Pharmacist Dosing Inpatient   Does not apply q1800   Continuous Infusions: . sodium chloride     PRN Meds: sodium chloride, acetaminophen, albuterol, camphor-menthol, HYDROcodone-acetaminophen, lactulose, nitroGLYCERIN, ondansetron (ZOFRAN) IV, polyethylene glycol, senna-docusate, sodium chloride flush, traZODone   Vital Signs    Vitals:   04/25/17 2142 04/25/17 2358 04/26/17 0343 04/26/17 0500  BP: 98/73 120/69 94/63   Pulse: 83 67 (!) 111   Resp:  20 16   Temp:  98.2 F (36.8 C) 97.7 F (36.5 C)   TempSrc:  Oral Oral   SpO2:  93% 90%   Weight:    170 lb 6.4 oz (77.3 kg)  Height:        Intake/Output Summary (Last 24 hours) at 04/26/2017 0708 Last data filed at 04/25/2017 2149 Gross per 24 hour  Intake 3 ml  Output 3500 ml  Net -3497 ml   Filed Weights   04/25/17 0728 04/25/17 1128 04/26/17 0500  Weight: 175 lb 14.8 oz (79.8 kg) 168 lb 10.4 oz (76.5 kg) 170 lb 6.4 oz (77.3 kg)    Telemetry    afib   80s to 90s   - Personally Reviewed  ECG    Physical Exam   GEN: No acute distress.   Neck: No JVD Cardiac:Irreg irreg   S1, S2  No S3   II/VI systolic murmur Respiratory: Clear to auscultation bilaterally. GI: Soft, nontender, non-distended  MS: No edema; No deformity. Neuro:  Nonfocal  Psych: Normal affect   Labs    Chemistry Recent Labs  Lab 04/20/17 0730 04/21/17 0014 04/23/17 0728 04/25/17 0816  NA 137 137 133* 131*  K 4.4 3.4* 5.6* 4.9  CL 98* 101 97* 93*  CO2 23 25 22 26   GLUCOSE 98 108* 97 87  BUN 50* 26* 50* 39*  CREATININE 7.11* 4.05* 8.30* 7.36*  CALCIUM 9.8 9.2 9.3 9.5  PROT 6.4*  --   --   --   ALBUMIN 3.2*  --  3.1* 3.0*  AST 17  --   --   --   ALT 17  --   --   --   ALKPHOS 58  --   --   --   BILITOT 0.7  --   --   --  GFRNONAA 5* 10* 4* 5*  GFRAA 6* 12* 5* 6*  ANIONGAP 16* 11 14 12      Hematology Recent Labs  Lab 04/21/17 0014 04/23/17 0728 04/25/17 0817  WBC 6.3 6.1 5.2  RBC 3.26* 3.24* 3.48*  HGB 9.3* 9.1* 9.6*  HCT 27.3* 27.1* 29.8*  MCV 83.7 83.6 85.6  MCH 28.5 28.1 27.6  MCHC 34.1 33.6 32.2  RDW 19.8* 19.8* 19.9*  PLT 118* 131* 122*    Cardiac Enzymes Recent Labs  Lab 04/20/17 0730 04/20/17 1351 04/20/17 1824 04/21/17 0014  TROPONINI 0.26* 0.27* 0.31* 0.25*   No results for input(s): TROPIPOC in the last 168 hours.   BNPNo results for input(s): BNP, PROBNP in the last 168 hours.   DDimer No results for input(s): DDIMER in the last 168 hours.   Radiology    No results found.  Cardiac Studies      Pat------------------------------------------------------------------- Study Conclusions  - Left ventricle: The cavity size was normal. Wall thickness was   increased in a pattern of severe LVH. Systolic function was   mildly to moderately reduced. The estimated ejection fraction was   in the range of 40% to 45%. Diffuse hypokinesis. - Mitral valve: Calcified annulus. The findings are consistent with   mild  stenosis. There was mild regurgitation. - Left atrium: The atrium was moderately dilated. - Right ventricle: The cavity size was mildly dilated. - Right atrium: The atrium was moderately dilated. - Tricuspid valve: There was severe regurgitation. - Pulmonary arteries: Systolic pressure was moderately to severely   increased. PA peak pressure: 65 mm Hg (S). - Line: A venous catheter was visualized with its tip in the right   atrium. - Pericardium, extracardiac: A small pericardial effusion was   identified.  Impressions:  - Mild global reduction in LV systolic function; severe LVH;   calcified aortic valve with no significant AS; severe MAC with   mild MS and mild MR; moderate biatrial enlargement; mld RVE;   severe TR with moderate to severe pulmonary hypertension; small   pericardial effusion.ient Profile     72 y.o. female with hx of ESRD (on dialysis), HTN, pulm HTN, HL, CAD, OSA and atrial fib  Admitted with CP, afib with RVR    Assessment & Plan    1 Persistent atrial fibI Rates are not bad overall   Keep on current meds  INR 2.    2  CAD   Minimal flat trop elev on admit  Pt deneis CP    3  Pulmonary HTN   On Letairis  Limitied echo for PAP, RV function  PAP is 65 by echo  RVEF is normal  WIll need to review initiatioin of med  3  ESRD  On dialysis.      4  Mild mitral stenosis    Pt OK for d/c from cardiac standpoint   Will make sure she has f/u  For questions or updates, please contact [place] Please consult [email or web address] for contact info under Cardiology/STEMI.      Signed, Dorris Carnes, MD  04/26/2017, 7:08 AM

## 2017-04-26 NOTE — Discharge Summary (Addendum)
Physician Discharge Summary  [name] TMA:[id number] DOB: 1945-07-26 DOA: 04/20/2017  PCP: Glendale Chard, MD  Admit date: 04/20/2017 Discharge date: 04/26/2017  Admitted From: Home Disposition: Home Recommendations for Outpatient Follow-up:  1. Follow up with PCP in 1-2 weeks 2. Please obtain BMP/CBC in one week 3. Please obtain INR on Monday, April 29.  Home Health yes Equipment/Devices: None Discharge Condition stable CODE STATUS full code Diet recommendation: Cardiac renal diet Brief/Interim Summary:72 y.o.femalewith medical history significantfor end-stage renal disease on dialysis, hypertension, pulmonary hypertension, hyperlipidemia, CAD with previous RCA stenting, obstructive sleep apnea and atrial fibrillation on Coumadin presents to the emergency department with the chief complaint of chest pain. Initial evaluation reveals atrial fibrillation with rapid ventricular response. [facility] are asked to admit  Information is obtained from the patient and the chart. She reports she was discharged yesterday after a five-day hospitalization for atrial fib with RVR, elevated troponin without acute EKG changes, anemia, infection and dialysis catheter. She reports she at home yesterday afternoon went to bed. She did not feel well as she believes "I should not have come home". She reports she went to dialysis center today they referred her to the emergency department for a heart rate range 80-140. She denies headache dizziness syncope or near-syncope. She does endorse some intermittent chest pain and palpitations that she says are chronic. She denies shortness of breath diaphoresis nausea vomiting diarrhea. She denies any lower extremity edema cough fever chills. She reports she did not take her medication today she usually does this after dialysis. ED Course:in the emergency department she's afebrile with a heart rate in 147 is not hypoxic she is provided with Cardizem drip. At  the time of admission rate 64 blood pressure 120/61     Discharge Diagnoses:  Principal Problem:   Atrial fibrillation with RVR (HCC) Active Problems:   Essential hypertension, malignant   Coronary atherosclerosis   Chronic diastolic heart failure (HCC)   ESRD   Depression   GERD (gastroesophageal reflux disease)   Epilepsy (HCC)   Dyslipidemia   Elevated troponin   A-fib (HCC)  1]A. fib with RVR recurrent secondary to noncompliance to medications. Patient was discharged from the hospital on73 and was readmitted from dialysis center with shortness of breath and elevated heart rate. she was started on Cardizem drip in the ER. Patient reported that she did not know that she had new medications to take at home. It is noted that her heart rate was difficult to control even during the last admission due to soft blood pressure. She is currently on digoxin 0.125 mg 3 times a week, Cardizem 60 mg 3 times daily Toprol 75mg  bid, Ranexa 500 mg twice a day aspirin and Coumadin. Patient also has a history of pulmonary hypertension for which she takes ambrisentan. She was on Isordil which has been DC'd by cardiology in order to better control the heart rate and to optimize rate control medications.  She doesalready has home health set up. Patient has been seen by PT and OT and recommends HHPT. Her INR is 2.02Today.  Will discharge her on Coumadin 10 mg Monday Wednesday Friday and 7.5 mg all the other days.  We will also order an INR to be checked Monday the 29th.  Patient will follow-up with [facility].  2]end-stage renal disease dialysis Monday Wednesday and Friday. Patient had infected TDC right thigh which was removed 4/14 and a new TDC has been placed in the left thigh. She got her last dose  of antibiotics vancomycin duringlast session.  3]anemia status post blood transfusion last admission. hemoglobin 9.1today stable.  4]history of hypertension but with soft blood  pressure.  5]hypothyroidism continue Synthroid.     Discharge Instructions  Discharge Instructions    Call MD for:  difficulty breathing, headache or visual disturbances   Complete by:  As directed    Call MD for:  persistant dizziness or light-headedness   Complete by:  As directed    Call MD for:  persistant nausea and vomiting   Complete by:  As directed    Call MD for:  severe uncontrolled pain   Complete by:  As directed    Call MD for:  temperature >100.4   Complete by:  As directed    Diet - low sodium heart healthy   Complete by:  As directed    Diet - low sodium heart healthy   Complete by:  As directed    Increase activity slowly   Complete by:  As directed    Increase activity slowly   Complete by:  As directed      Allergies as of 04/26/2017      Reactions   Ace Inhibitors Anaphylaxis, Rash   Heparin Other (See Comments)   MDs told her not to take after reaction in ICU. Tolerates Lovenox fine   Iohexol Swelling, Other (See Comments)   1970s; passed out and had facial/tongue swelling.  Requires 13-hour prep with prednisone and benadryl   Phenytoin Other (See Comments)   Had reaction while in ICU; doesn't know.  MDs told her not to take ever again.   Wellbutrin [bupropion] Other (See Comments)   seizures   Meperidine Hcl Swelling, Rash   Makes tongue swell   Morphine Rash   Penicillins Hives, Rash   Has patient had a PCN reaction causing immediate rash, facial/tongue/throat swelling, SOB or lightheadedness with hypotension: Yes Has patient had a PCN reaction causing severe rash involving mucus membranes or skin necrosis: No Has patient had a PCN reaction that required hospitalization: No Has patient had a PCN reaction occurring within the last 10 years: No If all of the above answers are "NO", then may proceed with Cephalosporin use.   Valproic Acid And Related Other (See Comments)   Confusion   Iodinated Diagnostic Agents Other (See Comments)    Pre-meditate with benadryl and prednisone 3 times before appt.   Pentazocine Lactate Other (See Comments)   Patient does not remember reaction to this med (Talwin).      Medication List    STOP taking these medications   cefTAZidime 2 g in sodium chloride 0.9 % 100 mL   isosorbide dinitrate 20 MG tablet Commonly known as:  ISORDIL   Vancomycin 750-5 MG/150ML-% Soln Commonly known as:  VANCOCIN     TAKE these medications   acetaminophen 500 MG tablet Commonly known as:  TYLENOL Take 1,000 mg by mouth daily as needed for mild pain.   albuterol 108 (90 Base) MCG/ACT inhaler Commonly known as:  PROVENTIL HFA;VENTOLIN HFA Inhale 1-2 puffs into the lungs every 6 (six) hours as needed for wheezing or shortness of breath.   aspirin 81 MG EC tablet Take 1 tablet (81 mg total) by mouth daily.   bisacodyl 5 MG EC tablet Commonly known as:  DULCOLAX Take 2 tablets (10 mg total) by mouth daily. Start taking on:  04/27/2017   camphor-menthol lotion Commonly known as:  SARNA Apply topically as needed for itching.   cilostazol 100  MG tablet Commonly known as:  PLETAL Take 1 tablet (100 mg total) by mouth 2 (two) times daily before a meal.   Darbepoetin Alfa 60 MCG/0.3ML Sosy injection Commonly known as:  ARANESP Inject 0.3 mLs (60 mcg total) into the skin every Monday with hemodialysis.   digoxin 0.125 MG tablet Commonly known as:  LANOXIN Take 1 tablet (0.125 mg total) by mouth 3 (three) times a week.   diltiazem 60 MG tablet Commonly known as:  CARDIZEM Take 1 tablet (60 mg total) by mouth 3 (three) times daily. Take BP prior to each dose and Hold if SBP < 90   diphenhydrAMINE 50 MG capsule Commonly known as:  BENADRYL Take 1 capsule (50 mg total) by mouth every Monday, Wednesday, and Friday. On dialysis days   folic acid-vitamin b complex-vitamin c-selenium-zinc 3 MG Tabs tablet Take 1 tablet by mouth daily.   HYDROcodone-acetaminophen 5-325 MG tablet Commonly known  as:  NORCO Take 1 tablet by mouth every 6 (six) hours as needed for moderate pain.   LETAIRIS 5 MG tablet Generic drug:  ambrisentan TAKE 1 TABLET (5MG ) BY MOUTH DAILY. DO NOT HANDLE IF PREGNANT. DO NOTSPLIT, CRUSH, OR CHEW. AVOID INHALATION AND CONTACT WITH SKIN OR EYES.   levETIRAcetam 500 MG tablet Commonly known as:  KEPPRA Take 500 mg by mouth daily at 8 pm.   levothyroxine 150 MCG tablet Commonly known as:  SYNTHROID, LEVOTHROID Take 150 mcg by mouth daily before breakfast.   Metoprolol Tartrate 75 MG Tabs Take 75 mg by mouth 2 (two) times daily.   multivitamin Tabs tablet Take 1 tablet by mouth at bedtime.   nitroGLYCERIN 0.4 MG SL tablet Commonly known as:  NITROSTAT Place 1 tablet (0.4 mg total) under the tongue every 5 (five) minutes as needed for chest pain.   omeprazole 20 MG tablet Commonly known as:  PRILOSEC OTC Take 20 mg by mouth daily.   polyethylene glycol packet Commonly known as:  MIRALAX / GLYCOLAX Take 17 g by mouth daily as needed for severe constipation.   pravastatin 40 MG tablet Commonly known as:  PRAVACHOL Take 1 tablet (40 mg total) by mouth at bedtime.   ranolazine 500 MG 12 hr tablet Commonly known as:  RANEXA Take 1 tablet (500 mg total) by mouth 2 (two) times daily.   RENVELA 800 MG tablet Generic drug:  sevelamer carbonate Take 1,600 mg by mouth 3 (three) times daily with meals. May take 1600 mg with each snack   senna-docusate 8.6-50 MG tablet Commonly known as:  Senokot-S Take 2 tablets by mouth at bedtime as needed for mild constipation or moderate constipation.   SENSIPAR 90 MG tablet Generic drug:  cinacalcet Take 90 mg by mouth every Monday, Wednesday, and Friday with hemodialysis.   traZODone 50 MG tablet Commonly known as:  DESYREL Take 50 mg by mouth at bedtime as needed for sleep.   warfarin 2.5 MG tablet Commonly known as:  COUMADIN Take as directed. If you are unsure how to take this medication, talk to your  nurse or doctor. Original instructions:  Take 10 mg Monday/wednesday/friday and 7.5 mg other days.check inr Monday April 29th. What changed:  additional instructions      Follow-up Information    Glendale Chard, MD Follow up.   Specialty:  Internal Medicine Contact information: [address] [place] [address] [phone]        Minus Breeding, MD .   Specialty:  Cardiology Contact information: [place]  [address] [place] [address] [phone]          Allergies  Allergen Reactions  . Ace Inhibitors Anaphylaxis and Rash  . Heparin Other (See Comments)    MDs told her not to take after reaction in ICU. Tolerates Lovenox fine  . Iohexol Swelling and Other (See Comments)    1970s; passed out and had facial/tongue swelling.  Requires 13-hour prep with prednisone and benadryl  . Phenytoin Other (See Comments)    Had reaction while in ICU; doesn't know.  MDs told her not to take ever again.  . Wellbutrin [Bupropion] Other (See Comments)    seizures  . Meperidine Hcl Swelling and Rash    Makes tongue swell  . Morphine Rash  . Penicillins Hives and Rash    Has patient had a PCN reaction causing immediate rash, facial/tongue/throat swelling, SOB or lightheadedness with hypotension: Yes Has patient had a PCN reaction causing severe rash involving mucus membranes or skin necrosis: No Has patient had a PCN reaction that required hospitalization: No Has patient had a PCN reaction occurring within the last 10 years: No If all of the above answers are "NO", then may proceed with Cephalosporin use.    . Valproic Acid And Related Other (See Comments)    Confusion   . Iodinated Diagnostic Agents Other (See Comments)    Pre-meditate with benadryl and prednisone 3 times before appt.  . Pentazocine Lactate Other (See Comments)    Patient does not remember reaction to this med (Talwin).      Consultations:  CARDIOLOGY,NEPHROLOGY   Procedures/Studies: Dg Chest 2 View  Result Date: 04/14/2017 CLINICAL DATA:  Chest pain today EXAM: CHEST - 2 VIEW COMPARISON:  Chest x-rays dated 04/10/2017 and 01/05/2017. FINDINGS: Stable cardiomegaly. Aortic atherosclerosis. Central catheter from femoral pro ches stable in position with tip at the expected level of the cavoatrial junction. Diffuse interstitial prominence is stable compared to recent exam, increased compared to an earlier chest x-ray of 10/06/2013. no confluent opacity to suggest a consolidating pneumonia. No pleural effusion or pneumothorax seen. No acute or suspicious osseous finding. IMPRESSION: 1. Cardiomegaly with persistent interstitial prominence, presumed interstitial edema, compatible with chronic mild CHF. No evidence of overt alveolar pulmonary edema. No evidence of pneumonia. 2. Aortic atherosclerosis. Electronically Signed   By: Franki Cabot M.D.   On: 04/14/2017 14:59   Dg Chest Portable 1 View  Result Date: 04/20/2017 CLINICAL DATA:  Shortness of breath, tachycardia, swelling EXAM: PORTABLE CHEST 1 VIEW COMPARISON:  04/17/2017 FINDINGS: There is bilateral interstitial thickening. There is no focal consolidation. There is no pleural effusion or pneumothorax. There is stable cardiomegaly. There is a central venous catheter coursing from the lower abdomen with the tip terminating in the lower SVC. The osseous structures are unremarkable. IMPRESSION: Cardiomegaly with mild pulmonary vascular congestion. Electronically Signed   By: Kathreen Devoid   On: 04/20/2017 08:45   Dg Chest Port 1 View  Result Date: 04/17/2017 CLINICAL DATA:  72 year old female with end-stage renal disease on dialysis. Postop. Subsequent encounter. EXAM: PORTABLE CHEST 1 VIEW COMPARISON:  04/16/2017 and 11/20/2016 chest x-ray. FINDINGS: Catheter extends from below diaphragm courses over the heart with the tip projecting at the level of the distal  superior vena cava just above the expected level of the cavoatrial junction. Cardiomegaly.  Mitral valve calcifications. Chronic lung changes with superimposed pulmonary vascular congestion. Calcified aorta. No pneumothorax. IMPRESSION: Catheter extends from below diaphragm courses over the heart with the tip projecting at the  level of the distal superior vena cava just above the expected level of the cavoatrial junction. Cardiomegaly. Mitral valve calcifications. Chronic lung changes with superimposed pulmonary vascular congestion. Aortic Atherosclerosis (ICD10-I70.0). Electronically Signed   By: Genia Del M.D.   On: 04/17/2017 08:49   Dg Chest Port 1 View  Result Date: 04/16/2017 CLINICAL DATA:  Pulmonary edema. EXAM: PORTABLE CHEST 1 VIEW COMPARISON:  Radiographs of April 14, 2017. FINDINGS: Stable cardiomegaly. Atherosclerosis of thoracic aorta is noted. No pneumothorax or pleural effusion is noted. Stable diffuse interstitial prominence is noted which may represent chronic scarring, but superimposed acute edema or inflammation cannot be excluded. Bony thorax is unremarkable. IMPRESSION: Stable diffuse interstitial prominence is noted which may represent chronic scarring, but superimposed acute edema or inflammation cannot be excluded. Aortic Atherosclerosis (ICD10-I70.0). Electronically Signed   By: Marijo Conception, M.D.   On: 04/16/2017 07:34   Dg Chest Port 1 View  Result Date: 04/10/2017 CLINICAL DATA:  Dialysis catheter placement EXAM: PORTABLE CHEST 1 VIEW COMPARISON:  January 05, 2017 FINDINGS: Central catheter placed from a femoral approach has its tip at the superior vena cava-right atrium junction. No pneumothorax. There is diffuse interstitial prominence, likely interstitial edema. No consolidation. There is cardiomegaly with pulmonary venous hypertension. No adenopathy. There is aortic atherosclerosis. No bone lesions. There is calcification in both carotid arteries. IMPRESSION: Inferiorly  placed central catheter tip at superior vena cava-right atrium junction. No pneumothorax. Evidence of chronic congestive heart failure. No consolidation. There is aortic atherosclerosis. There is calcification in each carotid artery. Aortic Atherosclerosis (ICD10-I70.0). Electronically Signed   By: Lowella Grip III M.D.   On: 04/10/2017 10:04   Dg Fluoro Guide Cv Line-no Report  Result Date: 04/17/2017 Fluoroscopy was utilized by the requesting physician.  No radiographic interpretation.   Dg Fluoro Guide Cv Line-no Report  Result Date: 04/10/2017 Fluoroscopy was utilized by the requesting physician.  No radiographic interpretation.    (Echo, Carotid, EGD, Colonoscopy, ERCP)    Subjective:   Discharge Exam: Vitals:   04/26/17 0829 04/26/17 0928  BP: 133/84   Pulse: 84 (!) 103  Resp: 18   Temp: (!) 97.5 F (36.4 C)   SpO2: 100%    Vitals:   04/26/17 0343 04/26/17 0500 04/26/17 0829 04/26/17 0928  BP: 94/63  133/84   Pulse: (!) 111  84 (!) 103  Resp: 16  18   Temp: 97.7 F (36.5 C)  (!) 97.5 F (36.4 C)   TempSrc: Oral  Oral   SpO2: 90%  100%   Weight:  77.3 kg (170 lb 6.4 oz)    Height:        General: Pt is alert, awake, not in acute distress Cardiovascular: RRR, S1/S2 +, no rubs, no gallops Respiratory: CTA bilaterally, no wheezing, no rhonchi Abdominal: Soft, NT, ND, bowel sounds + Extremities: no edema, no cyanosis    The results of significant diagnostics from this hospitalization (including imaging, microbiology, ancillary and laboratory) are listed below for reference.     Microbiology: Recent Results (from the past 240 hour(s))  Surgical pcr screen     Status: None   Collection Time: 04/17/17  2:22 AM  Result Value Ref Range Status   MRSA, PCR NEGATIVE NEGATIVE Final   Staphylococcus aureus NEGATIVE NEGATIVE Final    Comment: (NOTE) The Xpert SA Assay (FDA approved for NASAL specimens in patients 64 years of age and older), is one component of a  comprehensive surveillance program. It is not intended to  diagnose infection nor to guide or monitor treatment. Performed at [facility], [place] [address] [place], [place] [address]   MRSA PCR Screening     Status: None   Collection Time: 04/20/17  4:39 PM  Result Value Ref Range Status   MRSA by PCR NEGATIVE NEGATIVE Final    Comment:        The GeneXpert MRSA Assay (FDA approved for NASAL specimens only), is one component of a comprehensive MRSA colonization surveillance program. It is not intended to diagnose MRSA infection nor to guide or monitor treatment for MRSA infections. Performed at [facility], [place] [address] [place], [place] [address]      Labs: BNP (last 3 results) Recent Labs    04/14/17 1533  BNP 563.8*   Basic Metabolic Panel: Recent Labs  Lab 04/20/17 0730 04/21/17 0014 04/23/17 0728 04/25/17 0816  NA 137 137 133* 131*  K 4.4 3.4* 5.6* 4.9  CL 98* 101 97* 93*  CO2 23 25 22 26   GLUCOSE 98 108* 97 87  BUN 50* 26* 50* 39*  CREATININE 7.11* 4.05* 8.30* 7.36*  CALCIUM 9.8 9.2 9.3 9.5  PHOS  --   --  5.7* 5.3*   Liver Function Tests: Recent Labs  Lab 04/20/17 0730 04/23/17 0728 04/25/17 0816  AST 17  --   --   ALT 17  --   --   ALKPHOS 58  --   --   BILITOT 0.7  --   --   PROT 6.4*  --   --   ALBUMIN 3.2* 3.1* 3.0*   No results for input(s): LIPASE, AMYLASE in the last 168 hours. No results for input(s): AMMONIA in the last 168 hours. CBC: Recent Labs  Lab 04/20/17 0730 04/21/17 0014 04/23/17 0728 04/25/17 0817  WBC 6.1 6.3 6.1 5.2  NEUTROABS 4.0  --   --   --   HGB 9.4* 9.3* 9.1* 9.6*  HCT 28.9* 27.3* 27.1* 29.8*  MCV 85.3 83.7 83.6 85.6  PLT 129* 118* 131* 122*   Cardiac Enzymes: Recent Labs  Lab 04/20/17 0730 04/20/17 1351 04/20/17 1824 04/21/17 0014  TROPONINI 0.26* 0.27* 0.31* 0.25*   BNP: Invalid input(s): POCBNP CBG: No results for input(s): GLUCAP in the last 168 hours. D-Dimer No results for  input(s): DDIMER in the last 72 hours. Hgb A1c No results for input(s): HGBA1C in the last 72 hours. Lipid Profile No results for input(s): CHOL, HDL, LDLCALC, TRIG, CHOLHDL, LDLDIRECT in the last 72 hours. Thyroid function studies No results for input(s): TSH, T4TOTAL, T3FREE, THYROIDAB in the last 72 hours.  Invalid input(s): FREET3 Anemia work up No results for input(s): VITAMINB12, FOLATE, FERRITIN, TIBC, IRON, RETICCTPCT in the last 72 hours. Urinalysis    Component Value Date/Time   COLORURINE YELLOW 09/03/2014 0018   APPEARANCEUR CLOUDY (A) 09/03/2014 0018   LABSPEC 1.010 09/03/2014 0018   PHURINE 5.5 09/03/2014 0018   GLUCOSEU 250 (A) 09/03/2014 0018   HGBUR TRACE (A) 09/03/2014 0018   BILIRUBINUR NEGATIVE 09/03/2014 0018   KETONESUR NEGATIVE 09/03/2014 0018   PROTEINUR 100 (A) 09/03/2014 0018   UROBILINOGEN 0.2 09/03/2014 0018   NITRITE NEGATIVE 09/03/2014 0018   LEUKOCYTESUR NEGATIVE 09/03/2014 0018   Sepsis Labs Invalid input(s): PROCALCITONIN,  WBC,  LACTICIDVEN Microbiology Recent Results (from the past 240 hour(s))  Surgical pcr screen     Status: None   Collection Time: 04/17/17  2:22 AM  Result Value Ref Range Status   MRSA,  PCR NEGATIVE NEGATIVE Final   Staphylococcus aureus NEGATIVE NEGATIVE Final    Comment: (NOTE) The Xpert SA Assay (FDA approved for NASAL specimens in patients 3 years of age and older), is one component of a comprehensive surveillance program. It is not intended to diagnose infection nor to guide or monitor treatment. Performed at [facility], [place] [address] [place], [place] [address]   MRSA PCR Screening     Status: None   Collection Time: 04/20/17  4:39 PM  Result Value Ref Range Status   MRSA by PCR NEGATIVE NEGATIVE Final    Comment:        The GeneXpert MRSA Assay (FDA approved for NASAL specimens only), is one component of a comprehensive MRSA colonization surveillance program. It is not intended to diagnose  MRSA infection nor to guide or monitor treatment for MRSA infections. Performed at [facility], [place] [address] [place], [place] [address]      Time coordinating discharge: Over 93minutes  SIGNED:   Georgette Shell, MD  [facility] 04/26/2017, 10:24 AM Pager   If 7PM-7AM, please contact night-coverage [email or web address] Password [email or web address]

## 2017-04-26 NOTE — Progress Notes (Signed)
Assessment/Plan: 1. Persistent A fib s/p/RVR -  Per cards 2. Recent cath drainage - sp catheter removal/ replacementlast admission and ab.  3. ESRD - HD MWF.Do again today to max fluid removal which is more difficult with afib. No heparin due to allergy--reeval EDW 4. Volume - still vol up w/ LE edema, Vol control difficult due to Intermittent problems with hypotension, high afib VR, and/or angina while on HD.  5.S/p new LUA AVG - placed on 4/4 by Dr. Bridgett Larsson, 4 wks will be 05/03/17  Subjective: Interval History: ok  Objective: Vital signs in last 24 hours: Temp:  [97.5 F (36.4 C)-98.4 F (36.9 C)] 97.6 F (36.4 C) (04/25 1140) Pulse Rate:  [67-119] 84 (04/25 1140) Resp:  [15-22] 22 (04/25 1140) BP: (84-133)/(48-84) 106/66 (04/25 1140) SpO2:  [90 %-100 %] 93 % (04/25 1140) Weight:  [77.3 kg (170 lb 6.4 oz)] 77.3 kg (170 lb 6.4 oz) (04/25 0500) Weight change: 0.375 kg (13.2 oz)  Intake/Output from previous day: 04/24 0701 - 04/25 0700 In: 3 [I.V.:3] Out: 3500  Intake/Output this shift: No intake/output data recorded.  General appearance: alert and cooperative Resp: clear to auscultation bilaterally Cardio: irregularly irregular rhythm Extremities: edema 1+  TDC AVG LUE  Lab Results: Recent Labs    04/25/17 0817  WBC 5.2  HGB 9.6*  HCT 29.8*  PLT 122*   BMET:  Recent Labs    04/25/17 0816  NA 131*  K 4.9  CL 93*  CO2 26  GLUCOSE 87  BUN 39*  CREATININE 7.36*  CALCIUM 9.5   No results for input(s): PTH in the last 72 hours. Iron Studies: No results for input(s): IRON, TIBC, TRANSFERRIN, FERRITIN in the last 72 hours. Studies/Results: No results found.  Scheduled: . ambrisentan  5 mg Oral Daily  . aspirin EC  81 mg Oral Daily  . bisacodyl  10 mg Oral Daily  . bisacodyl  10 mg Rectal Once  . cilostazol  100 mg Oral BID AC  . cinacalcet  90 mg Oral Q M,W,F-HD  . Darbepoetin Alfa  60 mcg Subcutaneous Q Mon-HD  . digoxin  0.125 mg Oral Once per day on  Mon Wed Fri  . diltiazem  60 mg Oral TID  . diphenhydrAMINE  50 mg Oral Q M,W,F  . HYDROcodone-acetaminophen  1 tablet Oral Once  . levETIRAcetam  500 mg Oral Q2000  . levothyroxine  150 mcg Oral QAC breakfast  . metoprolol tartrate  75 mg Oral BID  . multivitamin  1 tablet Oral QHS  . pantoprazole  40 mg Oral QHS  . pravastatin  40 mg Oral QHS  . ranolazine  500 mg Oral BID  . sevelamer carbonate  1,600 mg Oral TID WC  . sodium chloride flush  3 mL Intravenous Q12H  . warfarin  10 mg Oral ONCE-1800  . [START ON 04/27/2017] warfarin  10 mg Oral Q M,W,F-1800  . [START ON 04/28/2017] warfarin  7.5 mg Oral Q T,Th,S,Su-1800  . Warfarin - Pharmacist Dosing Inpatient   Does not apply q1800     LOS: 6 days   [name] 04/26/2017,12:47 PM

## 2017-04-26 NOTE — Progress Notes (Signed)
ANTICOAGULATION CONSULT NOTE - [place] for warfarin Indication: atrial fibrillation  Labs: Recent Labs    04/24/17 0246 04/25/17 0200 04/25/17 0816 04/25/17 0817 04/26/17 0305  HGB  --   --   --  9.6*  --   HCT  --   --   --  29.8*  --   PLT  --   --   --  122*  --   LABPROT 17.7* 20.7*  --   --  22.7*  INR 1.47 1.80  --   --  2.02  CREATININE  --   --  7.36*  --   --    Estimated Creatinine Clearance: 6.7 mL/min (A) (by C-G formula based on SCr of 7.36 mg/dL (H)).  Assessment: 72 yo female with AFib and reported non-compliance with warfarin since discharge 4/10.   INR = 2.02  She is without noted bleeding complications and CBC remains stable.   Potential Drug/Drug interactions: 1.  ASA/Warfarin - The risk of bleeding, particularly gastrointestinal, may be increased by coadministration of warfarin with aspirin EC. However, use of low-dose aspirin with warfarin may provide benefit that outweighs the risk of minor bleeding.  2.  Levothyroxine/Warfarin - The hypoprothrombinemic effect of warfarin may be increased by levothyroxine.    Goal of Therapy:  INR 2-3 Monitor platelets by anticoagulation protocol: Yes  Plan:  -Warfarin 10 mg PO x1 tonight -Home dose recommendation - 10 mg MWF, 7.5 mg other days, INR check Monday -Daily INR  Thank you Anette Guarneri, PharmD [phone] 04/26/2017

## 2017-04-26 NOTE — Care Management Note (Addendum)
Case Management Note  Patient Details  Name: [name] MRN: [id number] Date of Birth: [birth year]  Subjective/Objective: Pt is a readmit for A fib - pt was discharged on 4/18 and returned 4/19    Action/Plan: PTA independent from home.  Pt is ESRD on outpt HD - pt utlizes [facility] for appts and HD sessions.  Pt is active with [facility] - agency aware of admit.      Expected Discharge Date:  04/26/17               Expected Discharge Plan:  [place]  In-House Referral:     Discharge planning Services  CM Consult  Post Acute Care Choice:    Choice offered to:  Patient  DME Arranged:    DME Agency:     HH Arranged:  RN, Disease Management, Nurse's Aide, PT HH Agency:  [facility]  Status of Service:  Completed, signed off  If discussed at [place] of Stay Meetings, dates discussed:    Additional Comments: Pt discussed in LOS 4/25 - pt was placed in [facility] program with [facility].  CM discussed recommendation with pt and she is in agreement with both the program and agency change.  CM informed [facility] that services are no longer needed.  Pt states her family will provide the recommended supervision at discharge - pt will transport home via private vehicle.  Pt has PCP and denied barriers with obtaining and paying for medications  04/24/17 Pt informed CM that she fell asleep the night before readmission and therefore did not take evening dose of medications.   Maryclare Labrador, RN 04/26/2017, 10:37 AM

## 2017-04-26 NOTE — Progress Notes (Signed)
OT orders received and acknowledged. Pt has been evaluated by OT and scheduled for OT session today

## 2017-04-26 NOTE — Progress Notes (Signed)
OT Cancellation Note  Patient Details Name: [name] MRN: [id number] DOB: 03-02-1945   Cancelled Treatment:    Reason Eval/Treat Not Completed: Patient at procedure or test/ unavailable. Pt at HD  [name] 04/26/2017, 1:44 PM

## 2017-04-26 NOTE — Progress Notes (Signed)
PT Cancellation Note  Patient Details Name: [name] MRN: [id number] DOB: [birth year]   Cancelled Treatment:    Reason Eval/Treat Not Completed: Patient at procedure or test/unavailable (HD). Will follow-up for PT treatment as schedule permits.  Mabeline Caras, PT, DPT Acute Rehab Services  Pager: [place] 04/26/2017, 2:14 PM

## 2017-04-27 ENCOUNTER — Other Ambulatory Visit: Payer: Self-pay

## 2017-04-27 DIAGNOSIS — D631 Anemia in chronic kidney disease: Secondary | ICD-10-CM | POA: Diagnosis not present

## 2017-04-27 DIAGNOSIS — N186 End stage renal disease: Secondary | ICD-10-CM | POA: Diagnosis not present

## 2017-04-27 DIAGNOSIS — D509 Iron deficiency anemia, unspecified: Secondary | ICD-10-CM | POA: Diagnosis not present

## 2017-04-27 DIAGNOSIS — N2581 Secondary hyperparathyroidism of renal origin: Secondary | ICD-10-CM | POA: Diagnosis not present

## 2017-04-27 DIAGNOSIS — E119 Type 2 diabetes mellitus without complications: Secondary | ICD-10-CM | POA: Diagnosis not present

## 2017-04-27 NOTE — Consult Note (Signed)
Post hospital follow up assess re-admission. Call placed to patient, [phone] spoke with the patient and HIPAA verified by DOB and home address.  Patient states she is feeling weak as she had dialysis today and had her HD catheter placed yesterday.  She states, "I no longer have [facility], I have [facility]."  Chart reviewed that patient is now [place].  Explained [facility] Care Management services, again.  Patient states, "I feel like I will get confused with all the different nurses and stuff since I am getting a new one anyway."  Patient verified that she has the 24 hour magnet on the refrigerator.  Encouraged her to call the nurse line for questions and additional needs. She again politely declined [facility] stating she will have a new company starting [[facility]] for home care.  For questions, please contact:  Natividad Brood, RN BSN [facility] Liaison  [phone] business mobile phone Toll free office [phone]

## 2017-04-28 DIAGNOSIS — I480 Paroxysmal atrial fibrillation: Secondary | ICD-10-CM | POA: Diagnosis not present

## 2017-04-28 DIAGNOSIS — I132 Hypertensive heart and chronic kidney disease with heart failure and with stage 5 chronic kidney disease, or end stage renal disease: Secondary | ICD-10-CM | POA: Diagnosis not present

## 2017-04-30 ENCOUNTER — Ambulatory Visit (INDEPENDENT_AMBULATORY_CARE_PROVIDER_SITE_OTHER): Payer: Medicare Other | Admitting: Pharmacist

## 2017-04-30 DIAGNOSIS — I132 Hypertensive heart and chronic kidney disease with heart failure and with stage 5 chronic kidney disease, or end stage renal disease: Secondary | ICD-10-CM | POA: Diagnosis not present

## 2017-04-30 DIAGNOSIS — I480 Paroxysmal atrial fibrillation: Secondary | ICD-10-CM | POA: Diagnosis not present

## 2017-04-30 DIAGNOSIS — I482 Chronic atrial fibrillation: Secondary | ICD-10-CM | POA: Diagnosis not present

## 2017-04-30 DIAGNOSIS — N2581 Secondary hyperparathyroidism of renal origin: Secondary | ICD-10-CM | POA: Diagnosis not present

## 2017-04-30 DIAGNOSIS — D631 Anemia in chronic kidney disease: Secondary | ICD-10-CM | POA: Diagnosis not present

## 2017-04-30 DIAGNOSIS — Z7901 Long term (current) use of anticoagulants: Secondary | ICD-10-CM

## 2017-04-30 DIAGNOSIS — E119 Type 2 diabetes mellitus without complications: Secondary | ICD-10-CM | POA: Diagnosis not present

## 2017-04-30 DIAGNOSIS — D509 Iron deficiency anemia, unspecified: Secondary | ICD-10-CM | POA: Diagnosis not present

## 2017-04-30 DIAGNOSIS — I4891 Unspecified atrial fibrillation: Secondary | ICD-10-CM

## 2017-04-30 DIAGNOSIS — N186 End stage renal disease: Secondary | ICD-10-CM | POA: Diagnosis not present

## 2017-04-30 LAB — POCT INR: INR: 6.1

## 2017-05-01 DIAGNOSIS — I480 Paroxysmal atrial fibrillation: Secondary | ICD-10-CM | POA: Diagnosis not present

## 2017-05-01 DIAGNOSIS — I132 Hypertensive heart and chronic kidney disease with heart failure and with stage 5 chronic kidney disease, or end stage renal disease: Secondary | ICD-10-CM | POA: Diagnosis not present

## 2017-05-02 DIAGNOSIS — I272 Pulmonary hypertension, unspecified: Secondary | ICD-10-CM | POA: Diagnosis not present

## 2017-05-02 DIAGNOSIS — I482 Chronic atrial fibrillation: Secondary | ICD-10-CM | POA: Diagnosis not present

## 2017-05-02 DIAGNOSIS — I4891 Unspecified atrial fibrillation: Secondary | ICD-10-CM | POA: Diagnosis not present

## 2017-05-02 DIAGNOSIS — Z8673 Personal history of transient ischemic attack (TIA), and cerebral infarction without residual deficits: Secondary | ICD-10-CM | POA: Diagnosis not present

## 2017-05-02 DIAGNOSIS — I2510 Atherosclerotic heart disease of native coronary artery without angina pectoris: Secondary | ICD-10-CM | POA: Diagnosis not present

## 2017-05-02 DIAGNOSIS — I429 Cardiomyopathy, unspecified: Secondary | ICD-10-CM | POA: Diagnosis not present

## 2017-05-02 DIAGNOSIS — N186 End stage renal disease: Secondary | ICD-10-CM | POA: Diagnosis not present

## 2017-05-02 DIAGNOSIS — Z7901 Long term (current) use of anticoagulants: Secondary | ICD-10-CM | POA: Diagnosis not present

## 2017-05-02 DIAGNOSIS — I10 Essential (primary) hypertension: Secondary | ICD-10-CM | POA: Diagnosis not present

## 2017-05-02 DIAGNOSIS — I480 Paroxysmal atrial fibrillation: Secondary | ICD-10-CM | POA: Diagnosis not present

## 2017-05-02 DIAGNOSIS — N2581 Secondary hyperparathyroidism of renal origin: Secondary | ICD-10-CM | POA: Diagnosis not present

## 2017-05-02 DIAGNOSIS — I132 Hypertensive heart and chronic kidney disease with heart failure and with stage 5 chronic kidney disease, or end stage renal disease: Secondary | ICD-10-CM | POA: Diagnosis not present

## 2017-05-02 DIAGNOSIS — D509 Iron deficiency anemia, unspecified: Secondary | ICD-10-CM | POA: Diagnosis not present

## 2017-05-02 DIAGNOSIS — E1129 Type 2 diabetes mellitus with other diabetic kidney complication: Secondary | ICD-10-CM | POA: Diagnosis not present

## 2017-05-02 DIAGNOSIS — E119 Type 2 diabetes mellitus without complications: Secondary | ICD-10-CM | POA: Diagnosis not present

## 2017-05-02 DIAGNOSIS — Z992 Dependence on renal dialysis: Secondary | ICD-10-CM | POA: Diagnosis not present

## 2017-05-02 DIAGNOSIS — D631 Anemia in chronic kidney disease: Secondary | ICD-10-CM | POA: Diagnosis not present

## 2017-05-03 ENCOUNTER — Encounter: Payer: Self-pay | Admitting: Cardiology

## 2017-05-03 ENCOUNTER — Telehealth: Payer: Self-pay | Admitting: Cardiology

## 2017-05-03 ENCOUNTER — Ambulatory Visit (INDEPENDENT_AMBULATORY_CARE_PROVIDER_SITE_OTHER): Payer: Medicare Other | Admitting: Pharmacist Clinician (PhC)/ Clinical Pharmacy Specialist

## 2017-05-03 ENCOUNTER — Ambulatory Visit (INDEPENDENT_AMBULATORY_CARE_PROVIDER_SITE_OTHER): Payer: Medicare Other | Admitting: Cardiology

## 2017-05-03 VITALS — BP 118/50 | HR 72 | Ht 62.0 in | Wt 165.0 lb

## 2017-05-03 DIAGNOSIS — I272 Pulmonary hypertension, unspecified: Secondary | ICD-10-CM | POA: Diagnosis not present

## 2017-05-03 DIAGNOSIS — I70213 Atherosclerosis of native arteries of extremities with intermittent claudication, bilateral legs: Secondary | ICD-10-CM

## 2017-05-03 DIAGNOSIS — I429 Cardiomyopathy, unspecified: Secondary | ICD-10-CM

## 2017-05-03 DIAGNOSIS — Z7901 Long term (current) use of anticoagulants: Secondary | ICD-10-CM | POA: Diagnosis not present

## 2017-05-03 DIAGNOSIS — I4891 Unspecified atrial fibrillation: Secondary | ICD-10-CM

## 2017-05-03 DIAGNOSIS — I132 Hypertensive heart and chronic kidney disease with heart failure and with stage 5 chronic kidney disease, or end stage renal disease: Secondary | ICD-10-CM | POA: Diagnosis not present

## 2017-05-03 DIAGNOSIS — I10 Essential (primary) hypertension: Secondary | ICD-10-CM

## 2017-05-03 DIAGNOSIS — I480 Paroxysmal atrial fibrillation: Secondary | ICD-10-CM | POA: Diagnosis not present

## 2017-05-03 DIAGNOSIS — Z8673 Personal history of transient ischemic attack (TIA), and cerebral infarction without residual deficits: Secondary | ICD-10-CM

## 2017-05-03 DIAGNOSIS — N186 End stage renal disease: Secondary | ICD-10-CM | POA: Diagnosis not present

## 2017-05-03 DIAGNOSIS — I2510 Atherosclerotic heart disease of native coronary artery without angina pectoris: Secondary | ICD-10-CM | POA: Diagnosis not present

## 2017-05-03 LAB — POCT INR: INR: 2

## 2017-05-03 NOTE — Assessment & Plan Note (Signed)
CVA 2003 affected cognition and memory per family, no focal deficits.

## 2017-05-03 NOTE — Patient Instructions (Signed)
Description   Take 2 tablets each Monday and Friday, 1.5 tablets all other days.  Repeat INR Monday with [place] - please draw INR Monday May 6 and call the results to [facility] at [place]

## 2017-05-03 NOTE — Telephone Encounter (Signed)
Returned call to [facility], patient will need to have INR repeat on Monday.

## 2017-05-03 NOTE — Assessment & Plan Note (Signed)
Admitted from dialysis 04/20/17 with AF with RVR-rate control, she is on Coumadin

## 2017-05-03 NOTE — Telephone Encounter (Signed)
Spoke with [facility] rn, aware patient has protime today. They are asking for more orders, will forward to CVRR.

## 2017-05-03 NOTE — Assessment & Plan Note (Signed)
Followed by [facility]anna Shape pulmonary-PA pressure 65 mmHg on recent echo

## 2017-05-03 NOTE — Assessment & Plan Note (Signed)
Recent INR 6- repeat today

## 2017-05-03 NOTE — Assessment & Plan Note (Signed)
Controlled.  

## 2017-05-03 NOTE — Progress Notes (Signed)
05/03/2017 [name]   10/26/1945  [id number]  Primary Physician Glendale Chard, MD Primary Cardiologist: Dr Percival Spanish  HPI:  Pleasant 72 y/o female followed by Dr Percival Spanish with a history of ESRD, CAD, pulmonary HTN,, PAF, chronic Coumadin Rx. She was hospitalized in [place] 2018 and had a cath which revealed CAD as described below, plan was for medical Rx. She had PAF then but it converted to NSR. She was admitted earlier in April with chest pain and noted to be anemic. She was transfused. She was also noted to have an infected [facility] that had just been placed 4/9. This was removed 04/15/17. She was discharge 04/19/17 and re admitted 04/20/17 with chest pain and AF with RVR. In reviewing her EKGs she was in NSR on 11/22/16 but subsequent EKGs in April all show AF.   She is in the office today as a post hospital follow up. She says she feels weak-"it's not like me". She attributes her symptoms to being in AF. Her rate is 72, B/P 118/50. She gets dialysis MWF.    Current Outpatient Medications  Medication Sig Dispense Refill  . acetaminophen (TYLENOL) 500 MG tablet Take 1,000 mg by mouth daily as needed for mild pain.     [name] albuterol (PROVENTIL HFA;VENTOLIN HFA) 108 (90 Base) MCG/ACT inhaler Inhale 1-2 puffs into the lungs every 6 (six) hours as needed for wheezing or shortness of breath. 1 Inhaler 0  . aspirin EC 81 MG EC tablet Take 1 tablet (81 mg total) by mouth daily. 30 tablet 0  . bisacodyl (DULCOLAX) 5 MG EC tablet Take 2 tablets (10 mg total) by mouth daily. 30 tablet 0  . camphor-menthol (SARNA) lotion Apply topically as needed for itching. 222 mL 0  . cilostazol (PLETAL) 100 MG tablet Take 1 tablet (100 mg total) by mouth 2 (two) times daily before a meal. 60 tablet 11  . Darbepoetin Alfa (ARANESP) 60 MCG/0.3ML SOSY injection Inject 0.3 mLs (60 mcg total) into the skin every Monday with hemodialysis. 4.2 mL 0  . digoxin (LANOXIN) 0.125 MG tablet Take 1 tablet (0.125 mg total) by mouth 3  (three) times a week. 30 tablet 0  . diltiazem (CARDIZEM) 60 MG tablet Take 1 tablet (60 mg total) by mouth 3 (three) times daily. Take BP prior to each dose and Hold if SBP < 90 90 tablet 0  . diphenhydrAMINE (BENADRYL) 50 MG capsule Take 1 capsule (50 mg total) by mouth every Monday, Wednesday, and Friday. On dialysis days    . folic acid-vitamin b complex-vitamin c-selenium-zinc (DIALYVITE) 3 MG TABS tablet Take 1 tablet by mouth daily.    [name] HYDROcodone-acetaminophen (NORCO) 5-325 MG tablet Take 1 tablet by mouth every 6 (six) hours as needed for moderate pain. 10 tablet 0  . LETAIRIS 5 MG tablet TAKE 1 TABLET (5MG ) BY MOUTH DAILY. DO NOT HANDLE IF PREGNANT. DO NOTSPLIT, CRUSH, OR CHEW. AVOID INHALATION AND CONTACT WITH SKIN OR EYES. 30 tablet 11  . levETIRAcetam (KEPPRA) 500 MG tablet Take 500 mg by mouth daily at 8 pm.    . levothyroxine (SYNTHROID, LEVOTHROID) 150 MCG tablet Take 150 mcg by mouth daily before breakfast.    . Metoprolol Tartrate 75 MG TABS Take 75 mg by mouth 2 (two) times daily. 60 tablet 0  . multivitamin (RENA-VIT) TABS tablet Take 1 tablet by mouth at bedtime. 30 each 0  . nitroGLYCERIN (NITROSTAT) 0.4 MG SL tablet Place 1 tablet (0.4 mg total) under the tongue  every 5 (five) minutes as needed for chest pain. 25 tablet 1  . omeprazole (PRILOSEC OTC) 20 MG tablet Take 20 mg by mouth daily.    . polyethylene glycol (MIRALAX / GLYCOLAX) packet Take 17 g by mouth daily as needed for severe constipation. 14 each 0  . pravastatin (PRAVACHOL) 40 MG tablet Take 1 tablet (40 mg total) by mouth at bedtime. 90 tablet 3  . ranolazine (RANEXA) 500 MG 12 hr tablet Take 1 tablet (500 mg total) by mouth 2 (two) times daily. 60 tablet 0  . RENVELA 800 MG tablet Take 1,600 mg by mouth 3 (three) times daily with meals. May take 1600 mg with each snack    . senna-docusate (SENOKOT-S) 8.6-50 MG tablet Take 2 tablets by mouth at bedtime as needed for mild constipation or moderate constipation. 30  tablet 0  . SENSIPAR 90 MG tablet Take 90 mg by mouth every Monday, Wednesday, and Friday with hemodialysis.     [name] traZODone (DESYREL) 50 MG tablet Take 50 mg by mouth at bedtime as needed for sleep.     [name] warfarin (COUMADIN) 2.5 MG tablet Take 10 mg Monday/wednesday/friday and 7.5 mg other days.check inr Monday April 29th. 90 tablet 1   No current facility-administered medications for this visit.    Facility-Administered Medications Ordered in Other Visits  Medication Dose Route Frequency Provider Last Rate Last Dose  . diphenhydrAMINE (BENADRYL) capsule 50 mg  50 mg Oral Once Aletta Edouard, MD        Allergies  Allergen Reactions  . Ace Inhibitors Anaphylaxis and Rash  . Heparin Other (See Comments)    MDs told her not to take after reaction in ICU. Tolerates Lovenox fine  . Iohexol Swelling and Other (See Comments)    1970s; passed out and had facial/tongue swelling.  Requires 13-hour prep with prednisone and benadryl  . Phenytoin Other (See Comments)    Had reaction while in ICU; doesn't know.  MDs told her not to take ever again.  . Wellbutrin [Bupropion] Other (See Comments)    seizures  . Meperidine Hcl Swelling and Rash    Makes tongue swell  . Morphine Rash  . Penicillins Hives and Rash    Has patient had a PCN reaction causing immediate rash, facial/tongue/throat swelling, SOB or lightheadedness with hypotension: Yes Has patient had a PCN reaction causing severe rash involving mucus membranes or skin necrosis: No Has patient had a PCN reaction that required hospitalization: No Has patient had a PCN reaction occurring within the last 10 years: No If all of the above answers are "NO", then may proceed with Cephalosporin use.    . Valproic Acid And Related Other (See Comments)    Confusion   . Iodinated Diagnostic Agents Other (See Comments)    Pre-meditate with benadryl and prednisone 3 times before appt.  . Pentazocine Lactate Other (See Comments)    Patient does  not remember reaction to this med ([place]).     Past Medical History:  Diagnosis Date  . Anemia    History  of Blood transfusion  . Asthma    PMH  . Atrial fibrillation with RVR ([place]) 11/2016  . CAD (coronary artery disease)    stent to RCA  . Cancer ([place])    clear cell cancer, kidney  . Complication of anesthesia 12/2010   pt is very confused, with AMS with anesthesia  . CVA (cerebral infarction) 2003   no apparent residual  . Dyslipidemia   .  Encephalopathy   . ESRD 07/27/2008   ESRD due to HTN and NSAID's, started hemodialysis in 2005 in [place], [place]. Went to [facility] from 2010 to 2012 and since 2012 has been getting dialysis at [facility] on [address] in [place] on a MWF schedule. First access with RUA AVG placed in [place]. Next and current access was LUA AVG placed by Dr. Lucky Cowboy in [place] in or around 2012. Has had 2 or 3 procedures on that graft since placed per family. She gets her access work done here in [place] now. She is allergic to heparin and does not get any heparin at dialysis; she had an allergic reaction apparently when in ICU in the past.      . GERD (gastroesophageal reflux disease)   . Hyperlipidemia   . Hypertension   . Hypothyroidism   . Myocardial infarction ([place])    per pt  . Positive PPD    completed rifampin  . Pulmonary hypertension ([place])   . PVD (peripheral vascular disease) ([place])   . Seizures ([place])    last seizure 6 years ago  . Sleep apnea    Sleep Study 2008  . Traumatic seroma of left lower leg ([place])   . Wears partial dentures     Social History   Socioeconomic History  . Marital status: Divorced    Spouse name: Not on file  . Number of children: 2  . Years of education: 35  . Highest education level: Not on file  Occupational History  . Occupation: Retired Optician, dispensing: RETIRED  Social Needs  . Financial resource strain: Not on file  . Food insecurity:    Worry: Not on file    Inability: Not on file    . Transportation needs:    Medical: Not on file    Non-medical: Not on file  Tobacco Use  . Smoking status: Former Smoker    Packs/day: 0.00    Years: 53.00    Pack years: 0.00    Types: Cigarettes  . Smokeless tobacco: Never Used  . Tobacco comment: quit smoking cigarettes in January 2019  Substance and Sexual Activity  . Alcohol use: Yes    Alcohol/week: 0.0 oz    Comment: very rarely per pt  . Drug use: No    Comment: former marijuana use, several years  . Sexual activity: Not Currently    Birth control/protection: Post-menopausal  Lifestyle  . Physical activity:    Days per week: Not on file    Minutes per session: Not on file  . Stress: Not on file  Relationships  . Social connections:    Talks on phone: Not on file    Gets together: Not on file    Attends religious service: Not on file    Active member of club or organization: Not on file    Attends meetings of clubs or organizations: Not on file    Relationship status: Not on file  . Intimate partner violence:    Fear of current or ex partner: Not on file    Emotionally abused: Not on file    Physically abused: Not on file    Forced sexual activity: Not on file  Other Topics Concern  . Not on file  Social History Narrative  . Not on file     Family History  Problem Relation Age of Onset  . Heart disease Father        CAD, died age 96  . Hypertension  Mother   . Dementia Mother   . Coronary artery disease Sister 69  . Hypertension Brother      Review of Systems: General: negative for chills, fever, night sweats or weight changes.  Cardiovascular: negative for chest pain, dyspnea on exertion, edema, orthopnea, palpitations, paroxysmal nocturnal dyspnea or shortness of breath Dermatological: negative for rash Respiratory: negative for cough or wheezing Urologic: negative for hematuria Abdominal: negative for nausea, vomiting, diarrhea, bright red blood per rectum, melena, or hematemesis Neurologic:  negative for visual changes, syncope, or dizziness All other systems reviewed and are otherwise negative except as noted above.    Blood pressure (!) 118/50, pulse 72, height 5\' 2"  (1.575 m), weight 165 lb (74.8 kg), SpO2 97 %.  General appearance: alert, cooperative and no distress Lungs: crackles Lt base Heart: irregularly irregular rhythm and 2/6 systolic murmur AOV area Skin: Skin color, texture, turgor normal. No rashes or lesions Neurologic: Grossly normal   ASSESSMENT AND PLAN:   Atrial fibrillation with RVR ([place]) Admitted from dialysis 04/20/17 with AF with RVR-rate control, she is on Coumadin  Cardiomyopathy ([place]) EF 40-45% by echo April 2019- in AF then. She also has severe LVH, AOV Ca++ without stenosis, and biatrial enlargement  Coronary atherosclerosis Last cath Nov 2018- medical Rx (PCI not felt to be an option, pt is poor candidate for CABG)  ESRD ESRD due to HTN and NSAID's, started hemodialysis in 2005 in [place], [place]. She is allergic to heparin and does not get any heparin at dialysis; she had an allergic reaction apparently while in ICU in the past. HD MWF  HTN (hypertension) Controlled  Pulmonary hypertension ([place]) Followed by [facility] pulmonary-PA pressure 65 mmHg on recent echo  Long term (current) use of anticoagulants [Z79.01] Recent INR 6- repeat today  History of CVA (cerebrovascular accident) CVA 2003 affected cognition and memory per family, no focal deficits.    PLAN  Will discuss with Dr Percival Spanish- she doesn't seem to be tolerating AF well- ? Consider Amiodarone and DCCV, would need to discuss this with pulmonary.   Kerin Ransom PA-C 05/03/2017 10:56 AM

## 2017-05-03 NOTE — Patient Instructions (Signed)
Medication Instructions:  Your physician recommends that you continue on your current medications as directed. Please refer to the Current Medication list given to you today.  Labwork: None   Testing/Procedures: None   Follow-Up: Your physician recommends that you schedule a follow-up appointment in: 6 weeks with DR [facility] ONLY.  Any Other Special Instructions Will Be Listed Below (If Applicable).  If you need a refill on your cardiac medications before your next appointment, please call your pharmacy.

## 2017-05-03 NOTE — Telephone Encounter (Signed)
New message:     Alvis Lemmings RN would like to know if pt had labs at her appt today.

## 2017-05-03 NOTE — Assessment & Plan Note (Signed)
EF 40-45% by echo April 2019- in AF then. She also has severe LVH, AOV Ca++ without stenosis, and biatrial enlargement

## 2017-05-03 NOTE — Assessment & Plan Note (Signed)
Last cath Nov 2018- medical Rx (PCI not felt to be an option, pt is poor candidate for CABG)

## 2017-05-03 NOTE — Assessment & Plan Note (Signed)
ESRD due to HTN and NSAID's, started hemodialysis in 2005 in [place], [place]. She is allergic to heparin and does not get any heparin at dialysis; she had an allergic reaction apparently while in ICU in the past. HD MWF

## 2017-05-04 ENCOUNTER — Telehealth: Payer: Self-pay | Admitting: Cardiology

## 2017-05-04 DIAGNOSIS — D631 Anemia in chronic kidney disease: Secondary | ICD-10-CM | POA: Diagnosis not present

## 2017-05-04 DIAGNOSIS — N186 End stage renal disease: Secondary | ICD-10-CM | POA: Diagnosis not present

## 2017-05-04 DIAGNOSIS — E119 Type 2 diabetes mellitus without complications: Secondary | ICD-10-CM | POA: Diagnosis not present

## 2017-05-04 DIAGNOSIS — D509 Iron deficiency anemia, unspecified: Secondary | ICD-10-CM | POA: Diagnosis not present

## 2017-05-04 DIAGNOSIS — N2581 Secondary hyperparathyroidism of renal origin: Secondary | ICD-10-CM | POA: Diagnosis not present

## 2017-05-04 NOTE — Telephone Encounter (Addendum)
Spoke with [place] from [employer]. She is calling b/c patient needed a BMET/CBC post hospital and it was not done at her OV yesterday and she cannot get vein access as patient has poor veins d/t dialysis. She called patient's dialysis center [facility] and was informed this center did her labs. Called dialysis center and spoke with Vicky. Will fax over request to [phone] for labs to be faxed to attn: Nya/Dr. Hochrein. Please follow up with [facility] nurse.

## 2017-05-04 NOTE — Telephone Encounter (Signed)
New message    Ag[place]ito Games from [place] calling to request verbal order for no repeat lab draw CBC, BMP.  Call [phone]

## 2017-05-07 ENCOUNTER — Ambulatory Visit (INDEPENDENT_AMBULATORY_CARE_PROVIDER_SITE_OTHER): Payer: Medicare Other | Admitting: Pharmacist Clinician (PhC)/ Clinical Pharmacy Specialist

## 2017-05-07 ENCOUNTER — Telehealth: Payer: Self-pay | Admitting: Cardiology

## 2017-05-07 DIAGNOSIS — D631 Anemia in chronic kidney disease: Secondary | ICD-10-CM | POA: Diagnosis not present

## 2017-05-07 DIAGNOSIS — D509 Iron deficiency anemia, unspecified: Secondary | ICD-10-CM | POA: Diagnosis not present

## 2017-05-07 DIAGNOSIS — E119 Type 2 diabetes mellitus without complications: Secondary | ICD-10-CM | POA: Diagnosis not present

## 2017-05-07 DIAGNOSIS — I4891 Unspecified atrial fibrillation: Secondary | ICD-10-CM

## 2017-05-07 DIAGNOSIS — I480 Paroxysmal atrial fibrillation: Secondary | ICD-10-CM | POA: Diagnosis not present

## 2017-05-07 DIAGNOSIS — I132 Hypertensive heart and chronic kidney disease with heart failure and with stage 5 chronic kidney disease, or end stage renal disease: Secondary | ICD-10-CM | POA: Diagnosis not present

## 2017-05-07 DIAGNOSIS — N186 End stage renal disease: Secondary | ICD-10-CM | POA: Diagnosis not present

## 2017-05-07 DIAGNOSIS — Z7901 Long term (current) use of anticoagulants: Secondary | ICD-10-CM | POA: Diagnosis not present

## 2017-05-07 DIAGNOSIS — N2581 Secondary hyperparathyroidism of renal origin: Secondary | ICD-10-CM | POA: Diagnosis not present

## 2017-05-07 LAB — POCT INR: INR: 1.7

## 2017-05-07 NOTE — Telephone Encounter (Signed)
See anticoag note

## 2017-05-07 NOTE — Telephone Encounter (Signed)
New Message:      shradda from [facility] home nurse is calling to give and inr report on pt. She states pt's inr today is 1.7.

## 2017-05-09 DIAGNOSIS — I480 Paroxysmal atrial fibrillation: Secondary | ICD-10-CM | POA: Diagnosis not present

## 2017-05-09 DIAGNOSIS — N2581 Secondary hyperparathyroidism of renal origin: Secondary | ICD-10-CM | POA: Diagnosis not present

## 2017-05-09 DIAGNOSIS — D509 Iron deficiency anemia, unspecified: Secondary | ICD-10-CM | POA: Diagnosis not present

## 2017-05-09 DIAGNOSIS — I132 Hypertensive heart and chronic kidney disease with heart failure and with stage 5 chronic kidney disease, or end stage renal disease: Secondary | ICD-10-CM | POA: Diagnosis not present

## 2017-05-09 DIAGNOSIS — I482 Chronic atrial fibrillation: Secondary | ICD-10-CM | POA: Diagnosis not present

## 2017-05-09 DIAGNOSIS — E119 Type 2 diabetes mellitus without complications: Secondary | ICD-10-CM | POA: Diagnosis not present

## 2017-05-09 DIAGNOSIS — D631 Anemia in chronic kidney disease: Secondary | ICD-10-CM | POA: Diagnosis not present

## 2017-05-09 DIAGNOSIS — N186 End stage renal disease: Secondary | ICD-10-CM | POA: Diagnosis not present

## 2017-05-11 DIAGNOSIS — D509 Iron deficiency anemia, unspecified: Secondary | ICD-10-CM | POA: Diagnosis not present

## 2017-05-11 DIAGNOSIS — N186 End stage renal disease: Secondary | ICD-10-CM | POA: Diagnosis not present

## 2017-05-11 DIAGNOSIS — D631 Anemia in chronic kidney disease: Secondary | ICD-10-CM | POA: Diagnosis not present

## 2017-05-11 DIAGNOSIS — N2581 Secondary hyperparathyroidism of renal origin: Secondary | ICD-10-CM | POA: Diagnosis not present

## 2017-05-11 DIAGNOSIS — E119 Type 2 diabetes mellitus without complications: Secondary | ICD-10-CM | POA: Diagnosis not present

## 2017-05-11 NOTE — Progress Notes (Signed)
    Postoperative Access Visit   History of Present Illness   [name] is a 72 y.o. year old female who presents for postoperative follow-up for: left redo UA AVG (Date: 04/11/18).  The patient's wounds are healed.  The patient notes no steal symptoms.  The patient is able to complete their activities of daily living.  The patient's current symptoms are: none.   Physical Examination   Vitals:   05/16/17 1522  BP: 133/76  Pulse: 87  Resp: 14  Temp: (!) 97.3 F (36.3 C)  TempSrc: Oral  SpO2: 99%  Weight: 161 lb (73 kg)  Height: 5\' 4"  (1.626 m)   Body mass index is 27.64 kg/m.  left arm Incisions are healed, skin feels warm, hand grip is 5/5, sensation in digits is intact, faintly palpable thrill, bruit can be auscultated     Medical Decision Making   [name] is a 72 y.o. year old female who presents s/p left redo UA AVG   The patient's access is ready for use. New arteriovenous graft is LATERAL to prior AVG.  The patient's tunneled dialysis catheter can be removed after two successful cannulations and completed dialysis treatments.  Thank you for allowing [place] to participate in this patient's care.   Adele Barthel, MD, FACS [facility] Office: [phone] Pager: [phone]

## 2017-05-14 ENCOUNTER — Ambulatory Visit (INDEPENDENT_AMBULATORY_CARE_PROVIDER_SITE_OTHER): Payer: Medicare Other | Admitting: Pharmacist

## 2017-05-14 DIAGNOSIS — N2581 Secondary hyperparathyroidism of renal origin: Secondary | ICD-10-CM | POA: Diagnosis not present

## 2017-05-14 DIAGNOSIS — N186 End stage renal disease: Secondary | ICD-10-CM | POA: Diagnosis not present

## 2017-05-14 DIAGNOSIS — I132 Hypertensive heart and chronic kidney disease with heart failure and with stage 5 chronic kidney disease, or end stage renal disease: Secondary | ICD-10-CM | POA: Diagnosis not present

## 2017-05-14 DIAGNOSIS — Z7901 Long term (current) use of anticoagulants: Secondary | ICD-10-CM | POA: Diagnosis not present

## 2017-05-14 DIAGNOSIS — I480 Paroxysmal atrial fibrillation: Secondary | ICD-10-CM | POA: Diagnosis not present

## 2017-05-14 DIAGNOSIS — D509 Iron deficiency anemia, unspecified: Secondary | ICD-10-CM | POA: Diagnosis not present

## 2017-05-14 DIAGNOSIS — E119 Type 2 diabetes mellitus without complications: Secondary | ICD-10-CM | POA: Diagnosis not present

## 2017-05-14 DIAGNOSIS — D631 Anemia in chronic kidney disease: Secondary | ICD-10-CM | POA: Diagnosis not present

## 2017-05-14 LAB — POCT INR: INR: 2.2

## 2017-05-16 ENCOUNTER — Other Ambulatory Visit: Payer: Self-pay

## 2017-05-16 ENCOUNTER — Encounter: Payer: Self-pay | Admitting: Vascular Surgery

## 2017-05-16 ENCOUNTER — Ambulatory Visit (INDEPENDENT_AMBULATORY_CARE_PROVIDER_SITE_OTHER): Payer: Self-pay | Admitting: Vascular Surgery

## 2017-05-16 VITALS — BP 133/76 | HR 87 | Temp 97.3°F | Resp 14 | Ht 64.0 in | Wt 161.0 lb

## 2017-05-16 DIAGNOSIS — D631 Anemia in chronic kidney disease: Secondary | ICD-10-CM | POA: Diagnosis not present

## 2017-05-16 DIAGNOSIS — E119 Type 2 diabetes mellitus without complications: Secondary | ICD-10-CM | POA: Diagnosis not present

## 2017-05-16 DIAGNOSIS — I482 Chronic atrial fibrillation: Secondary | ICD-10-CM | POA: Diagnosis not present

## 2017-05-16 DIAGNOSIS — N186 End stage renal disease: Secondary | ICD-10-CM

## 2017-05-16 DIAGNOSIS — D509 Iron deficiency anemia, unspecified: Secondary | ICD-10-CM | POA: Diagnosis not present

## 2017-05-16 DIAGNOSIS — N2581 Secondary hyperparathyroidism of renal origin: Secondary | ICD-10-CM | POA: Diagnosis not present

## 2017-05-16 LAB — MISC LABCORP TEST (SEND OUT): Labcorp test code: 8482

## 2017-05-18 DIAGNOSIS — D631 Anemia in chronic kidney disease: Secondary | ICD-10-CM | POA: Diagnosis not present

## 2017-05-18 DIAGNOSIS — D509 Iron deficiency anemia, unspecified: Secondary | ICD-10-CM | POA: Diagnosis not present

## 2017-05-18 DIAGNOSIS — N2581 Secondary hyperparathyroidism of renal origin: Secondary | ICD-10-CM | POA: Diagnosis not present

## 2017-05-18 DIAGNOSIS — N186 End stage renal disease: Secondary | ICD-10-CM | POA: Diagnosis not present

## 2017-05-18 DIAGNOSIS — E119 Type 2 diabetes mellitus without complications: Secondary | ICD-10-CM | POA: Diagnosis not present

## 2017-05-21 ENCOUNTER — Other Ambulatory Visit: Payer: Self-pay

## 2017-05-21 ENCOUNTER — Emergency Department (HOSPITAL_COMMUNITY)
Admission: EM | Admit: 2017-05-21 | Discharge: 2017-05-21 | Disposition: A | Discharge status: Home / Self Care | Payer: Medicare Other | Attending: Emergency Medicine | Admitting: Emergency Medicine

## 2017-05-21 ENCOUNTER — Encounter (HOSPITAL_COMMUNITY): Payer: Self-pay

## 2017-05-21 DIAGNOSIS — J45909 Unspecified asthma, uncomplicated: Secondary | ICD-10-CM | POA: Insufficient documentation

## 2017-05-21 DIAGNOSIS — Z87891 Personal history of nicotine dependence: Secondary | ICD-10-CM | POA: Diagnosis not present

## 2017-05-21 DIAGNOSIS — T82898A Other specified complication of vascular prosthetic devices, implants and grafts, initial encounter: Secondary | ICD-10-CM | POA: Diagnosis not present

## 2017-05-21 DIAGNOSIS — Z7901 Long term (current) use of anticoagulants: Secondary | ICD-10-CM | POA: Insufficient documentation

## 2017-05-21 DIAGNOSIS — Z85528 Personal history of other malignant neoplasm of kidney: Secondary | ICD-10-CM | POA: Diagnosis not present

## 2017-05-21 DIAGNOSIS — I252 Old myocardial infarction: Secondary | ICD-10-CM | POA: Insufficient documentation

## 2017-05-21 DIAGNOSIS — I2510 Atherosclerotic heart disease of native coronary artery without angina pectoris: Secondary | ICD-10-CM | POA: Diagnosis not present

## 2017-05-21 DIAGNOSIS — Z7982 Long term (current) use of aspirin: Secondary | ICD-10-CM | POA: Diagnosis not present

## 2017-05-21 DIAGNOSIS — E039 Hypothyroidism, unspecified: Secondary | ICD-10-CM | POA: Insufficient documentation

## 2017-05-21 DIAGNOSIS — D631 Anemia in chronic kidney disease: Secondary | ICD-10-CM | POA: Diagnosis not present

## 2017-05-21 DIAGNOSIS — Z992 Dependence on renal dialysis: Secondary | ICD-10-CM | POA: Diagnosis not present

## 2017-05-21 DIAGNOSIS — T83498A Other mechanical complication of other prosthetic devices, implants and grafts of genital tract, initial encounter: Secondary | ICD-10-CM | POA: Diagnosis not present

## 2017-05-21 DIAGNOSIS — T829XXA Unspecified complication of cardiac and vascular prosthetic device, implant and graft, initial encounter: Secondary | ICD-10-CM

## 2017-05-21 DIAGNOSIS — I120 Hypertensive chronic kidney disease with stage 5 chronic kidney disease or end stage renal disease: Secondary | ICD-10-CM | POA: Insufficient documentation

## 2017-05-21 DIAGNOSIS — E119 Type 2 diabetes mellitus without complications: Secondary | ICD-10-CM | POA: Diagnosis not present

## 2017-05-21 DIAGNOSIS — Z79899 Other long term (current) drug therapy: Secondary | ICD-10-CM | POA: Insufficient documentation

## 2017-05-21 DIAGNOSIS — D509 Iron deficiency anemia, unspecified: Secondary | ICD-10-CM | POA: Diagnosis not present

## 2017-05-21 DIAGNOSIS — Z452 Encounter for adjustment and management of vascular access device: Secondary | ICD-10-CM | POA: Insufficient documentation

## 2017-05-21 DIAGNOSIS — N186 End stage renal disease: Secondary | ICD-10-CM | POA: Insufficient documentation

## 2017-05-21 DIAGNOSIS — N2581 Secondary hyperparathyroidism of renal origin: Secondary | ICD-10-CM | POA: Diagnosis not present

## 2017-05-21 DIAGNOSIS — T849XXA Unspecified complication of internal orthopedic prosthetic device, implant and graft, initial encounter: Secondary | ICD-10-CM | POA: Diagnosis not present

## 2017-05-21 NOTE — ED Provider Notes (Signed)
[facility] EMERGENCY DEPARTMENT Provider Note   CSN: [id number] Arrival date & time: 05/21/17  6546     History   Chief Complaint Chief Complaint  Patient presents with  . Vascular Access Problem    HPI Patient is a 72 year old female with history of ESRD on HD MWF, pulmonary HTN, HTN, HLD, CAD s/p RCA stenting, OSA, AF on coumadin who presents from dialysis center with concerns for vascular access issue. Patient reports she dislodged her left tunneled groin dialysis catheter while in the shower today. When she arrived for dialysis and informed them of this, she was taken to ED. Of note, patient has recently undergone revision of her L AVG. She was seen by Vascular Surgery on 5/15, cleared to use her graft with plans to discontinue tunneled catheter after 2 successful HD rounds.  It seems that there has been some confusion with the dialysis center, and she has not used her left UE graft site. Patient currently denies any complaints.   Past Medical History:  Diagnosis Date  . Anemia    History  of Blood transfusion  . Asthma    PMH  . Atrial fibrillation with RVR ([place]) 11/2016  . CAD (coronary artery disease)    stent to RCA  . Cancer ([place])    clear cell cancer, kidney  . Complication of anesthesia 12/2010   pt is very confused, with AMS with anesthesia  . CVA (cerebral infarction) 2003   no apparent residual  . Dyslipidemia   . Encephalopathy   . ESRD 07/27/2008   ESRD due to HTN and NSAID's, started hemodialysis in 2005 in [place], [place]. Went to [facility] from 2010 to 2012 and since 2012 has been getting dialysis at [facility] on [address] in [place] on a MWF schedule. First access with RUA AVG placed in [place]. Next and current access was LUA AVG placed by Dr. Lucky Cowboy in [place] in or around 2012. Has had 2 or 3 procedures on that graft since placed per family. She gets her access work done here in [place] now. She is allergic to heparin and  does not get any heparin at dialysis; she had an allergic reaction apparently when in ICU in the past.      . GERD (gastroesophageal reflux disease)   . Hyperlipidemia   . Hypertension   . Hypothyroidism   . Myocardial infarction ([place])    per pt  . Positive PPD    completed rifampin  . Pulmonary hypertension ([place])   . PVD (peripheral vascular disease) ([place])   . Seizures ([place])    last seizure 6 years ago  . Sleep apnea    Sleep Study 2008  . Traumatic seroma of left lower leg ([place])   . Wears partial dentures     Patient Active Problem List   Diagnosis Date Noted  . Cardiomyopathy ([name]) 05/03/2017  . Elevated troponin 04/20/2017  . Infection of hemodialysis tunneled catheter ([place]) 04/19/2017  . Chest pain at rest 04/19/2017  . Long term (current) use of anticoagulants [Z79.01] 12/08/2016  . Atrial fibrillation with RVR ([place]) 11/20/2016  . Precordial pain 10/26/2016  . Hematoma 09/12/2016  . Dyslipidemia 05/25/2016  . Atherosclerosis of native arteries of extremity with intermittent claudication ([place]) 12/16/2014  . Nausea vomiting and diarrhea 09/03/2014  . Hyperkalemia 09/03/2014  . Diarrhea 09/03/2014  . Pulmonary hypertension ([place]) 09/23/2012  . Hyperkalemia, diminished renal excretion 06/27/2012  . Generalized convulsive epilepsy without mention of intractable epilepsy 09/14/2011  .  Epilepsy ([place]) 05/04/2011  . Physical deconditioning 05/04/2011  . HTN (hypertension) 04/30/2011  . Depression 04/30/2011  . GERD (gastroesophageal reflux disease) 04/30/2011  . Acute pulmonary edema ([place]) 04/29/2011  . Nonspecific abnormal electrocardiogram (ECG) (EKG) 02/23/2011  . Essential hypertension, malignant 05/11/2009  . TOBACCO USER 07/27/2008  . Coronary atherosclerosis 07/27/2008  . History of CVA (cerebrovascular accident) 07/27/2008  . PVD 07/27/2008  . ESRD 07/27/2008    Past Surgical History:  Procedure Laterality Date  . ABDOMINAL AORTOGRAM W/LOWER EXTREMITY  Bilateral 06/15/2016   Procedure: Abdominal Aortogram w/Lower Extremity;  Surgeon: Conrad [place], MD;  Location: [facility];  Service: Cardiovascular;  Laterality: Bilateral;  . ABDOMINAL HYSTERECTOMY    . APPENDECTOMY    . APPLICATION OF WOUND VAC Left 09/12/2016   Procedure: APPLICATION OF WOUND VAC;  Surgeon: Conrad [place], MD;  Location: [facility];  Service: Vascular;  Laterality: Left;  . ARTERY EXPLORATION Left 07/25/2016   Procedure: ARTERY EXPLORATION LEFT ABOVE KNEE POPLITEAL AND GROIN;  Surgeon: Conrad [place], MD;  Location: [facility];  Service: Vascular;  Laterality: Left;  . AV FISTULA PLACEMENT Left 04/10/2017   Procedure: INSERTION OF ARTERIOVENOUS (AV) GORE-TEX GRAFT REDO LEFT UPPER ARM;  Surgeon: Conrad Madera, MD;  Location: [facility];  Service: Vascular;  Laterality: Left;  . CARDIAC CATHETERIZATION     last 2016  . CHOLECYSTECTOMY    . COLONOSCOPY    . D&Cs    . HEMATOMA EVACUATION Left 09/12/2016   Procedure: EVACUATION HEMATOMA;  Surgeon: Conrad [place], MD;  Location: [facility];  Service: Vascular;  Laterality: Left;  . INSERTION OF DIALYSIS CATHETER Left 04/17/2017   Procedure: INSERTION OF DIALYSIS CATHETER LEFT GROIN;  Surgeon: Conrad Dewey-Humboldt, MD;  Location: [facility];  Service: Vascular;  Laterality: Left;  . INSERTION OF DIALYSIS CATHETER Right 04/10/2017   Procedure: INSERTION OF DIALYSIS CATHETER;  Surgeon: Conrad Browning, MD;  Location: [facility] OR;  Service: Vascular;  Laterality: Right;  . IR RADIOLOGIST EVAL & MGMT  03/13/2017  . LEFT HEART CATH AND CORONARY ANGIOGRAPHY N/A 11/22/2016   Procedure: LEFT HEART CATH AND CORONARY ANGIOGRAPHY;  Surgeon: Belva Crome, MD;  Location: [facility];  Service: Cardiovascular;  Laterality: N/A;  . LEFT HEART CATHETERIZATION WITH CORONARY ANGIOGRAM N/A 02/22/2011   Procedure: LEFT HEART CATHETERIZATION WITH CORONARY ANGIOGRAM;  Surgeon: Wellington Hampshire, MD;  Location: [facility] CATH LAB;  Service: Cardiovascular;  Laterality: N/A;  . left  nephrectomy    . MULTIPLE TOOTH EXTRACTIONS    . NEPHRECTOMY Left    Malignant tumor  . PERIPHERAL VASCULAR CATHETERIZATION N/A 12/31/2014   Procedure: Abdominal Aortogram;  Surgeon: Conrad [place], MD;  Location: [facility] CV LAB;  Service: Cardiovascular;  Laterality: N/A;  . right ankle repair    . RIGHT HEART CATHETERIZATION N/A 01/29/2014   Procedure: RIGHT HEART CATH;  Surgeon: Larey Dresser, MD;  Location: [facility] CATH LAB;  Service: Cardiovascular;  Laterality: N/A;  . TONSILLECTOMY    . TUBAL LIGATION    . ULTRASOUND GUIDANCE FOR VASCULAR ACCESS  11/22/2016   Procedure: Ultrasound Guidance For Vascular Access;  Surgeon: Belva Crome, MD;  Location: [facility] CV LAB;  Service: Cardiovascular;;  . UPPER EXTREMITY VENOGRAPHY Bilateral 03/22/2017   Procedure: UPPER EXTREMITY VENOGRAPHY;  Surgeon: Conrad , MD;  Location: [facility] CV LAB;  Service: Cardiovascular;  Laterality: Bilateral;  bilateral  . VASCULAR SURGERY  11/2010   graft inserted  to left arm     OB History   None      Home Medications    Prior to Admission medications   Medication Sig Start Date End Date Taking? Authorizing Provider  acetaminophen (TYLENOL) 500 MG tablet Take 1,000 mg by mouth daily as needed for mild pain.     [provider]  albuterol (PROVENTIL HFA;VENTOLIN HFA) 108 (90 Base) MCG/ACT inhaler Inhale 1-2 puffs into the lungs every 6 (six) hours as needed for wheezing or shortness of breath. 01/05/17   Marney Setting, NP  aspirin EC 81 MG EC tablet Take 1 tablet (81 mg total) by mouth daily. 04/20/17   Debbe Odea, MD  bisacodyl (DULCOLAX) 5 MG EC tablet Take 2 tablets (10 mg total) by mouth daily. 04/27/17   Georgette Shell, MD  camphor-menthol [facility]) lotion Apply topically as needed for itching. 04/26/17   Georgette Shell, MD  cilostazol (PLETAL) 100 MG tablet Take 1 tablet (100 mg total) by mouth 2 (two) times daily before a meal. 05/24/16   Conrad [place]field, MD    Darbepoetin Alfa (ARANESP) 60 MCG/0.3ML SOSY injection Inject 0.3 mLs (60 mcg total) into the skin every Monday with hemodialysis. 04/26/17   Georgette Shell, MD  digoxin (LANOXIN) 0.125 MG tablet Take 1 tablet (0.125 mg total) by mouth 3 (three) times a week. 04/19/17   Debbe Odea, MD  diltiazem (CARDIZEM) 60 MG tablet Take 1 tablet (60 mg total) by mouth 3 (three) times daily. Take BP prior to each dose and Hold if SBP < 90 04/19/17   Debbe Odea, MD  diphenhydrAMINE (BENADRYL) 50 MG capsule Take 1 capsule (50 mg total) by mouth every Monday, Wednesday, and Friday. On dialysis days 09/15/16   Gabriel Earing, PA-C  folic acid-vitamin b complex-vitamin c-selenium-zinc (DIALYVITE) 3 MG TABS tablet Take 1 tablet by mouth daily.    [provider]  HYDROcodone-acetaminophen (NORCO) 5-325 MG tablet Take 1 tablet by mouth every 6 (six) hours as needed for moderate pain. Patient not taking: Reported on 05/16/2017 04/10/17   Dagoberto Ligas, PA-C  LETAIRIS 5 MG tablet TAKE 1 TABLET (5MG ) BY MOUTH DAILY. DO NOT HANDLE IF PREGNANT. DO NOTSPLIT, CRUSH, OR CHEW. AVOID INHALATION AND CONTACT WITH SKIN OR EYES. 09/18/16   Juanito Doom, MD  levETIRAcetam (KEPPRA) 500 MG tablet Take 500 mg by mouth daily at 8 pm.    [provider]  levothyroxine (SYNTHROID, LEVOTHROID) 150 MCG tablet Take 150 mcg by mouth daily before breakfast.    [provider]  metoprolol tartrate (LOPRESSOR) 50 MG tablet TAKE 1 AND 1/2 TABS (75 MG) BY MOUTH 2 (TWO) TIMES DAILY 04/26/17   [provider]  Metoprolol Tartrate 75 MG TABS Take 75 mg by mouth 2 (two) times daily. 04/26/17   Georgette Shell, MD  multivitamin (RENA-VIT) TABS tablet Take 1 tablet by mouth at bedtime. 04/19/17   Debbe Odea, MD  nitroGLYCERIN (NITROSTAT) 0.4 MG SL tablet Place 1 tablet (0.4 mg total) under the tongue every 5 (five) minutes as needed for chest pain. 10/26/16   Minus Breeding, MD  omeprazole (PRILOSEC  OTC) 20 MG tablet Take 20 mg by mouth daily.    [provider]  polyethylene glycol (MIRALAX / GLYCOLAX) packet Take 17 g by mouth daily as needed for severe constipation. 04/26/17   Georgette Shell, MD  polyethylene glycol powder (GLYCOLAX/MIRALAX) powder TAKE 17 G BY MOUTH DAILY AS NEEDED FOR SEVERE CONSTIPATION. 04/26/17  [provider]  pravastatin (PRAVACHOL) 40 MG tablet Take 1 tablet (40 mg total) by mouth at bedtime. 05/23/16   Minus Breeding, MD  ranolazine (RANEXA) 500 MG 12 hr tablet Take 1 tablet (500 mg total) by mouth 2 (two) times daily. 04/19/17   Debbe Odea, MD  RENVELA 800 MG tablet Take 1,600 mg by mouth 3 (three) times daily with meals. May take 1600 mg with each snack 03/08/17   [provider]  senna-docusate (SENOKOT-S) 8.6-50 MG tablet Take 2 tablets by mouth at bedtime as needed for mild constipation or moderate constipation. 04/19/17   Debbe Odea, MD  SENSIPAR 90 MG tablet Take 90 mg by mouth every Monday, Wednesday, and Friday with hemodialysis.  03/05/14   [provider]  traZODone (DESYREL) 50 MG tablet Take 50 mg by mouth at bedtime as needed for sleep.     [provider]  warfarin (COUMADIN) 2.5 MG tablet Take 10 mg Monday/wednesday/friday and 7.5 mg other days.check inr Monday April 29th. 04/26/17   Georgette Shell, MD  warfarin (COUMADIN) 5 MG tablet  05/03/17   [provider]  warfarin (COUMADIN) 7.5 MG tablet  05/03/17   [provider]    Family History Family History  Problem Relation Age of Onset  . Heart disease Father        CAD, died age 35  . Hypertension Mother   . Dementia Mother   . Coronary artery disease Sister 79  . Hypertension Brother     Social History Social History   Tobacco Use  . Smoking status: Former Smoker    Packs/day: 0.00    Years: 53.00    Pack years: 0.00    Types: Cigarettes  . Smokeless tobacco: Never Used  . Tobacco comment: quit smoking  cigarettes in January 2019  Substance Use Topics  . Alcohol use: Yes    Alcohol/week: 0.0 oz    Comment: very rarely per pt  . Drug use: No    Comment: former marijuana use, several years     Allergies   Ace inhibitors; Heparin; Iohexol; Phenytoin; Wellbutrin [bupropion]; Meperidine hcl; Morphine; Penicillins; Valproic acid and related; Iodinated diagnostic agents; and Pentazocine lactate   Review of Systems Review of Systems  Constitutional: Negative for chills and fever.  HENT: Negative for ear pain and sore throat.   Eyes: Negative for pain and visual disturbance.  Respiratory: Negative for cough and shortness of breath.   Cardiovascular: Negative for chest pain and palpitations.  Gastrointestinal: Negative for abdominal pain and vomiting.  Genitourinary: Negative for dysuria and hematuria.  Musculoskeletal: Negative for arthralgias and back pain.  Skin: Negative for color change and rash.  Neurological: Negative for seizures and syncope.  All other systems reviewed and are negative.    Physical Exam Updated Vital Signs BP (!) 143/74   Pulse 84   Temp 98.2 F (36.8 C) (Oral)   Resp 16   Ht 5\' 4"  (1.626 m)   Wt 74 kg (163 lb 2.3 oz)   SpO2 95%   BMI 28.00 kg/m   Physical Exam  Constitutional: She appears well-developed and well-nourished. No distress.  HENT:  Head: Normocephalic and atraumatic.  Eyes: Conjunctivae are normal.  Neck: Neck supple.  Cardiovascular: Normal rate and regular rhythm.  No murmur heard. Pulmonary/Chest: Effort normal and breath sounds normal. No respiratory distress.  Abdominal: Soft. There is no tenderness.  Musculoskeletal: She exhibits no edema.  Neurological: She is alert.  Skin: Skin is warm  and dry.  L groin tunneled catheter dislodged approximately 2 cm out from skin. No purulence or erythema noted.   Psychiatric: She has a normal mood and affect.  Nursing note and vitals reviewed.    ED Treatments / Results  Labs (all  labs ordered are listed, but only abnormal results are displayed) Labs Reviewed - No data to display  EKG None  Radiology No results found.  Procedures Procedures (including critical care time)  Medications Ordered in ED Medications - No data to display   Initial Impression / Assessment and Plan / ED Course  I have reviewed the triage vital signs and the nursing notes.  Pertinent labs & imaging results that were available during my care of the patient were reviewed by me and considered in my medical decision making (see chart for details).     Patient is a 72 year old female with history of ESRD on HD MWF, pulmonary HTN, HTN, HLD, CAD s/p RCA stenting, OSA, AF on coumadin who presents from dialysis center with concerns for vascular access issue.  Dislodged left femoral tunneled line while in shower this morning.  On examination, tunneled line is still in place, pulled out approximately 2 cm.  I reviewed records, recent documentation from vascular surgeon saying okay to use left AV graft site.  I called the dialysis clinic and spoke to charge nurse about patient's access.  She reported they were unaware the patient was able to use her graft site yet.  Plan for transfer back to dialysis center for dialysis today, provided documentation from last vascular surgery clinic appointment stating okay to use left AV graft site.  Sterile dressing placed over tunneled catheter, as I imagine this will be removed if patient successfully undergoes next 2 treatments of dialysis from L AVG site.   Patient's daughter arrived to transport patient back to dialysis facility.  Return precautions discussed.  Patient and plan of care discussed with Attending physician, Dr. Jeanell Sparrow.    Final Clinical Impressions(s) / ED Diagnoses   Final diagnoses:  Central line complication, initial encounter    ED Discharge Orders    None       Arnetha Massy, MD 05/21/17 1941    Pattricia Boss, MD 05/22/17  1600

## 2017-05-21 NOTE — ED Provider Notes (Signed)
72 year old female sent from hemodialysis for reports that they did not have access.  She has a left groin dialysis catheter in place and has recently had AV fistula placed in the left arm.  Review of chart reveals that Dr. Bridgett Larsson had cleared the AV fistula for use.  She had some dislodgment of the left groin dialysis catheter this morning. Vitals:   05/21/17 0831  BP: (!) 149/86  Pulse: 67  Resp: 16  Temp: 98.2 F (36.8 C)  SpO2: 98%   Well-developed well-nourished female who does not appear to be in any acute distress. Left groin catheter pulled out approximately 4 cm. Discussed with dialysis they will be able to use AV fistula Dressing placed for catheter Patient will be transported back to dialysis by her daughter.   I performed a history and physical examination of [name] and discussed her management with Dr. Olin Pia.  I agree with the history, physical, assessment, and plan of care, with the following exceptions: None  I was present for the following procedures: None Time Spent in Critical Care of the patient: None Time spent in discussions with the patient and family: 10  Normajean Baxter, MD 05/21/17 [phone]

## 2017-05-21 NOTE — Discharge Instructions (Addendum)
You will be discharged back to your dialysis center to receive dialysis today through your repaired left AV fistula graft.  Your groin dialysis catheter has been secured, and should remain in place.  After 2 sessions of dialysis, this can be removed.

## 2017-05-21 NOTE — ED Notes (Signed)
Pt goes to [facility] on [address]

## 2017-05-21 NOTE — ED Triage Notes (Addendum)
Per [facility], pt accidentally pulled dialysis catheter out this morning while showering. Pt then went to dialysis and stated that they couldn't do dialysis due to "graft not being entered into the computer right". VS 148 palpated BP, Pulse 82, 94% on RA.

## 2017-05-21 NOTE — ED Notes (Addendum)
Pt verbalized understanding of d/c instructions and has no further questions, VSS, NAD. New dressing was placed over dialysis catheter. Pt is being transported to dialysis by her daughter and has an appointment at 1000

## 2017-05-23 ENCOUNTER — Telehealth (HOSPITAL_COMMUNITY): Payer: Self-pay

## 2017-05-23 DIAGNOSIS — N2581 Secondary hyperparathyroidism of renal origin: Secondary | ICD-10-CM | POA: Diagnosis not present

## 2017-05-23 DIAGNOSIS — D631 Anemia in chronic kidney disease: Secondary | ICD-10-CM | POA: Diagnosis not present

## 2017-05-23 DIAGNOSIS — I482 Chronic atrial fibrillation: Secondary | ICD-10-CM | POA: Diagnosis not present

## 2017-05-23 DIAGNOSIS — N186 End stage renal disease: Secondary | ICD-10-CM | POA: Diagnosis not present

## 2017-05-23 DIAGNOSIS — E119 Type 2 diabetes mellitus without complications: Secondary | ICD-10-CM | POA: Diagnosis not present

## 2017-05-23 DIAGNOSIS — D509 Iron deficiency anemia, unspecified: Secondary | ICD-10-CM | POA: Diagnosis not present

## 2017-05-23 LAB — PROTIME-INR: INR: 2 — AB (ref 0.9–1.1)

## 2017-05-23 NOTE — Telephone Encounter (Signed)
Called to schedule, no answer, left vm. AW

## 2017-05-24 DIAGNOSIS — I480 Paroxysmal atrial fibrillation: Secondary | ICD-10-CM | POA: Diagnosis not present

## 2017-05-24 DIAGNOSIS — D631 Anemia in chronic kidney disease: Secondary | ICD-10-CM | POA: Diagnosis not present

## 2017-05-24 DIAGNOSIS — D509 Iron deficiency anemia, unspecified: Secondary | ICD-10-CM | POA: Diagnosis not present

## 2017-05-24 DIAGNOSIS — I132 Hypertensive heart and chronic kidney disease with heart failure and with stage 5 chronic kidney disease, or end stage renal disease: Secondary | ICD-10-CM | POA: Diagnosis not present

## 2017-05-24 DIAGNOSIS — E119 Type 2 diabetes mellitus without complications: Secondary | ICD-10-CM | POA: Diagnosis not present

## 2017-05-24 DIAGNOSIS — N2581 Secondary hyperparathyroidism of renal origin: Secondary | ICD-10-CM | POA: Diagnosis not present

## 2017-05-24 DIAGNOSIS — N186 End stage renal disease: Secondary | ICD-10-CM | POA: Diagnosis not present

## 2017-05-25 DIAGNOSIS — D509 Iron deficiency anemia, unspecified: Secondary | ICD-10-CM | POA: Diagnosis not present

## 2017-05-25 DIAGNOSIS — N186 End stage renal disease: Secondary | ICD-10-CM | POA: Diagnosis not present

## 2017-05-25 DIAGNOSIS — N2581 Secondary hyperparathyroidism of renal origin: Secondary | ICD-10-CM | POA: Diagnosis not present

## 2017-05-25 DIAGNOSIS — E119 Type 2 diabetes mellitus without complications: Secondary | ICD-10-CM | POA: Diagnosis not present

## 2017-05-25 DIAGNOSIS — D631 Anemia in chronic kidney disease: Secondary | ICD-10-CM | POA: Diagnosis not present

## 2017-05-28 DIAGNOSIS — N186 End stage renal disease: Secondary | ICD-10-CM | POA: Diagnosis not present

## 2017-05-28 DIAGNOSIS — N2581 Secondary hyperparathyroidism of renal origin: Secondary | ICD-10-CM | POA: Diagnosis not present

## 2017-05-28 DIAGNOSIS — E119 Type 2 diabetes mellitus without complications: Secondary | ICD-10-CM | POA: Diagnosis not present

## 2017-05-28 DIAGNOSIS — D631 Anemia in chronic kidney disease: Secondary | ICD-10-CM | POA: Diagnosis not present

## 2017-05-28 DIAGNOSIS — D509 Iron deficiency anemia, unspecified: Secondary | ICD-10-CM | POA: Diagnosis not present

## 2017-05-29 ENCOUNTER — Ambulatory Visit (INDEPENDENT_AMBULATORY_CARE_PROVIDER_SITE_OTHER): Payer: Medicare Other | Admitting: Pharmacist Clinician (PhC)/ Clinical Pharmacy Specialist

## 2017-05-29 DIAGNOSIS — Z7901 Long term (current) use of anticoagulants: Secondary | ICD-10-CM

## 2017-05-29 DIAGNOSIS — I4891 Unspecified atrial fibrillation: Secondary | ICD-10-CM

## 2017-05-30 DIAGNOSIS — D509 Iron deficiency anemia, unspecified: Secondary | ICD-10-CM | POA: Diagnosis not present

## 2017-05-30 DIAGNOSIS — E119 Type 2 diabetes mellitus without complications: Secondary | ICD-10-CM | POA: Diagnosis not present

## 2017-05-30 DIAGNOSIS — N2581 Secondary hyperparathyroidism of renal origin: Secondary | ICD-10-CM | POA: Diagnosis not present

## 2017-05-30 DIAGNOSIS — D631 Anemia in chronic kidney disease: Secondary | ICD-10-CM | POA: Diagnosis not present

## 2017-05-30 DIAGNOSIS — N186 End stage renal disease: Secondary | ICD-10-CM | POA: Diagnosis not present

## 2017-05-30 DIAGNOSIS — I482 Chronic atrial fibrillation: Secondary | ICD-10-CM | POA: Diagnosis not present

## 2017-06-01 DIAGNOSIS — D631 Anemia in chronic kidney disease: Secondary | ICD-10-CM | POA: Diagnosis not present

## 2017-06-01 DIAGNOSIS — D509 Iron deficiency anemia, unspecified: Secondary | ICD-10-CM | POA: Diagnosis not present

## 2017-06-01 DIAGNOSIS — N2581 Secondary hyperparathyroidism of renal origin: Secondary | ICD-10-CM | POA: Diagnosis not present

## 2017-06-01 DIAGNOSIS — N186 End stage renal disease: Secondary | ICD-10-CM | POA: Diagnosis not present

## 2017-06-01 DIAGNOSIS — E119 Type 2 diabetes mellitus without complications: Secondary | ICD-10-CM | POA: Diagnosis not present

## 2017-06-02 DIAGNOSIS — Z992 Dependence on renal dialysis: Secondary | ICD-10-CM | POA: Diagnosis not present

## 2017-06-02 DIAGNOSIS — E1129 Type 2 diabetes mellitus with other diabetic kidney complication: Secondary | ICD-10-CM | POA: Diagnosis not present

## 2017-06-02 DIAGNOSIS — N186 End stage renal disease: Secondary | ICD-10-CM | POA: Diagnosis not present

## 2017-06-04 DIAGNOSIS — E119 Type 2 diabetes mellitus without complications: Secondary | ICD-10-CM | POA: Diagnosis not present

## 2017-06-04 DIAGNOSIS — D631 Anemia in chronic kidney disease: Secondary | ICD-10-CM | POA: Diagnosis not present

## 2017-06-04 DIAGNOSIS — N186 End stage renal disease: Secondary | ICD-10-CM | POA: Diagnosis not present

## 2017-06-04 DIAGNOSIS — N2581 Secondary hyperparathyroidism of renal origin: Secondary | ICD-10-CM | POA: Diagnosis not present

## 2017-06-04 DIAGNOSIS — D509 Iron deficiency anemia, unspecified: Secondary | ICD-10-CM | POA: Diagnosis not present

## 2017-06-06 DIAGNOSIS — N2581 Secondary hyperparathyroidism of renal origin: Secondary | ICD-10-CM | POA: Diagnosis not present

## 2017-06-06 DIAGNOSIS — E119 Type 2 diabetes mellitus without complications: Secondary | ICD-10-CM | POA: Diagnosis not present

## 2017-06-06 DIAGNOSIS — D509 Iron deficiency anemia, unspecified: Secondary | ICD-10-CM | POA: Diagnosis not present

## 2017-06-06 DIAGNOSIS — N186 End stage renal disease: Secondary | ICD-10-CM | POA: Diagnosis not present

## 2017-06-06 DIAGNOSIS — D631 Anemia in chronic kidney disease: Secondary | ICD-10-CM | POA: Diagnosis not present

## 2017-06-06 DIAGNOSIS — I482 Chronic atrial fibrillation: Secondary | ICD-10-CM | POA: Diagnosis not present

## 2017-06-08 DIAGNOSIS — D631 Anemia in chronic kidney disease: Secondary | ICD-10-CM | POA: Diagnosis not present

## 2017-06-08 DIAGNOSIS — N186 End stage renal disease: Secondary | ICD-10-CM | POA: Diagnosis not present

## 2017-06-08 DIAGNOSIS — E119 Type 2 diabetes mellitus without complications: Secondary | ICD-10-CM | POA: Diagnosis not present

## 2017-06-08 DIAGNOSIS — D509 Iron deficiency anemia, unspecified: Secondary | ICD-10-CM | POA: Diagnosis not present

## 2017-06-08 DIAGNOSIS — N2581 Secondary hyperparathyroidism of renal origin: Secondary | ICD-10-CM | POA: Diagnosis not present

## 2017-06-11 DIAGNOSIS — N2581 Secondary hyperparathyroidism of renal origin: Secondary | ICD-10-CM | POA: Diagnosis not present

## 2017-06-11 DIAGNOSIS — D631 Anemia in chronic kidney disease: Secondary | ICD-10-CM | POA: Diagnosis not present

## 2017-06-11 DIAGNOSIS — D509 Iron deficiency anemia, unspecified: Secondary | ICD-10-CM | POA: Diagnosis not present

## 2017-06-11 DIAGNOSIS — N186 End stage renal disease: Secondary | ICD-10-CM | POA: Diagnosis not present

## 2017-06-11 DIAGNOSIS — E119 Type 2 diabetes mellitus without complications: Secondary | ICD-10-CM | POA: Diagnosis not present

## 2017-06-13 DIAGNOSIS — I482 Chronic atrial fibrillation: Secondary | ICD-10-CM | POA: Diagnosis not present

## 2017-06-13 DIAGNOSIS — N2581 Secondary hyperparathyroidism of renal origin: Secondary | ICD-10-CM | POA: Diagnosis not present

## 2017-06-13 DIAGNOSIS — E119 Type 2 diabetes mellitus without complications: Secondary | ICD-10-CM | POA: Diagnosis not present

## 2017-06-13 DIAGNOSIS — N186 End stage renal disease: Secondary | ICD-10-CM | POA: Diagnosis not present

## 2017-06-13 DIAGNOSIS — D631 Anemia in chronic kidney disease: Secondary | ICD-10-CM | POA: Diagnosis not present

## 2017-06-13 DIAGNOSIS — D509 Iron deficiency anemia, unspecified: Secondary | ICD-10-CM | POA: Diagnosis not present

## 2017-06-15 DIAGNOSIS — E119 Type 2 diabetes mellitus without complications: Secondary | ICD-10-CM | POA: Diagnosis not present

## 2017-06-15 DIAGNOSIS — D509 Iron deficiency anemia, unspecified: Secondary | ICD-10-CM | POA: Diagnosis not present

## 2017-06-15 DIAGNOSIS — D631 Anemia in chronic kidney disease: Secondary | ICD-10-CM | POA: Diagnosis not present

## 2017-06-15 DIAGNOSIS — N186 End stage renal disease: Secondary | ICD-10-CM | POA: Diagnosis not present

## 2017-06-15 DIAGNOSIS — N2581 Secondary hyperparathyroidism of renal origin: Secondary | ICD-10-CM | POA: Diagnosis not present

## 2017-06-15 LAB — ORGANISM IDENTIFICATION, MOLD

## 2017-06-15 LAB — MOLD ORGANISM REFLEX

## 2017-06-16 LAB — AEROBIC CULTURE W GRAM STAIN (SUPERFICIAL SPECIMEN)

## 2017-06-16 LAB — AEROBIC CULTURE  (SUPERFICIAL SPECIMEN)

## 2017-06-18 DIAGNOSIS — D509 Iron deficiency anemia, unspecified: Secondary | ICD-10-CM | POA: Diagnosis not present

## 2017-06-18 DIAGNOSIS — E119 Type 2 diabetes mellitus without complications: Secondary | ICD-10-CM | POA: Diagnosis not present

## 2017-06-18 DIAGNOSIS — N2581 Secondary hyperparathyroidism of renal origin: Secondary | ICD-10-CM | POA: Diagnosis not present

## 2017-06-18 DIAGNOSIS — N186 End stage renal disease: Secondary | ICD-10-CM | POA: Diagnosis not present

## 2017-06-18 DIAGNOSIS — D631 Anemia in chronic kidney disease: Secondary | ICD-10-CM | POA: Diagnosis not present

## 2017-06-19 ENCOUNTER — Other Ambulatory Visit: Payer: Self-pay | Admitting: *Deleted

## 2017-06-19 DIAGNOSIS — I739 Peripheral vascular disease, unspecified: Secondary | ICD-10-CM

## 2017-06-19 MED ORDER — CILOSTAZOL 100 MG PO TABS: 100.0000 mg | ORAL_TABLET | Freq: Two times a day (BID) | ORAL | 11 refills | Status: DC

## 2017-06-20 ENCOUNTER — Encounter (HOSPITAL_COMMUNITY): Payer: Self-pay | Admitting: Emergency Medicine

## 2017-06-20 ENCOUNTER — Emergency Department (HOSPITAL_COMMUNITY): Payer: Medicare Other

## 2017-06-20 ENCOUNTER — Inpatient Hospital Stay (HOSPITAL_COMMUNITY)
Admission: EM | Admit: 2017-06-20 | Discharge: 2017-06-23 | DRG: 640 | Disposition: A | Discharge status: Home Health | Payer: Medicare Other | Attending: Internal Medicine | Admitting: Internal Medicine

## 2017-06-20 ENCOUNTER — Other Ambulatory Visit: Payer: Self-pay

## 2017-06-20 DIAGNOSIS — N2581 Secondary hyperparathyroidism of renal origin: Secondary | ICD-10-CM | POA: Diagnosis present

## 2017-06-20 DIAGNOSIS — R531 Weakness: Secondary | ICD-10-CM | POA: Diagnosis not present

## 2017-06-20 DIAGNOSIS — Z905 Acquired absence of kidney: Secondary | ICD-10-CM

## 2017-06-20 DIAGNOSIS — I4891 Unspecified atrial fibrillation: Secondary | ICD-10-CM | POA: Diagnosis not present

## 2017-06-20 DIAGNOSIS — G40909 Epilepsy, unspecified, not intractable, without status epilepticus: Secondary | ICD-10-CM

## 2017-06-20 DIAGNOSIS — I482 Chronic atrial fibrillation: Secondary | ICD-10-CM | POA: Diagnosis not present

## 2017-06-20 DIAGNOSIS — D631 Anemia in chronic kidney disease: Secondary | ICD-10-CM | POA: Diagnosis present

## 2017-06-20 DIAGNOSIS — G4730 Sleep apnea, unspecified: Secondary | ICD-10-CM | POA: Diagnosis present

## 2017-06-20 DIAGNOSIS — I252 Old myocardial infarction: Secondary | ICD-10-CM

## 2017-06-20 DIAGNOSIS — R609 Edema, unspecified: Secondary | ICD-10-CM | POA: Diagnosis not present

## 2017-06-20 DIAGNOSIS — W19XXXA Unspecified fall, initial encounter: Secondary | ICD-10-CM | POA: Diagnosis not present

## 2017-06-20 DIAGNOSIS — E1151 Type 2 diabetes mellitus with diabetic peripheral angiopathy without gangrene: Secondary | ICD-10-CM | POA: Diagnosis present

## 2017-06-20 DIAGNOSIS — I959 Hypotension, unspecified: Secondary | ICD-10-CM | POA: Diagnosis present

## 2017-06-20 DIAGNOSIS — I272 Pulmonary hypertension, unspecified: Secondary | ICD-10-CM | POA: Diagnosis present

## 2017-06-20 DIAGNOSIS — Z7982 Long term (current) use of aspirin: Secondary | ICD-10-CM

## 2017-06-20 DIAGNOSIS — E875 Hyperkalemia: Secondary | ICD-10-CM | POA: Diagnosis not present

## 2017-06-20 DIAGNOSIS — M79676 Pain in unspecified toe(s): Secondary | ICD-10-CM

## 2017-06-20 DIAGNOSIS — N186 End stage renal disease: Secondary | ICD-10-CM | POA: Diagnosis present

## 2017-06-20 DIAGNOSIS — Z8673 Personal history of transient ischemic attack (TIA), and cerebral infarction without residual deficits: Secondary | ICD-10-CM

## 2017-06-20 DIAGNOSIS — M79674 Pain in right toe(s): Secondary | ICD-10-CM | POA: Diagnosis present

## 2017-06-20 DIAGNOSIS — Z7901 Long term (current) use of anticoagulants: Secondary | ICD-10-CM

## 2017-06-20 DIAGNOSIS — E785 Hyperlipidemia, unspecified: Secondary | ICD-10-CM | POA: Diagnosis present

## 2017-06-20 DIAGNOSIS — Z992 Dependence on renal dialysis: Secondary | ICD-10-CM

## 2017-06-20 DIAGNOSIS — R55 Syncope and collapse: Secondary | ICD-10-CM | POA: Diagnosis not present

## 2017-06-20 DIAGNOSIS — I2510 Atherosclerotic heart disease of native coronary artery without angina pectoris: Secondary | ICD-10-CM | POA: Diagnosis present

## 2017-06-20 DIAGNOSIS — I120 Hypertensive chronic kidney disease with stage 5 chronic kidney disease or end stage renal disease: Secondary | ICD-10-CM | POA: Diagnosis present

## 2017-06-20 DIAGNOSIS — Z85528 Personal history of other malignant neoplasm of kidney: Secondary | ICD-10-CM

## 2017-06-20 DIAGNOSIS — E8770 Fluid overload, unspecified: Principal | ICD-10-CM | POA: Diagnosis present

## 2017-06-20 DIAGNOSIS — I429 Cardiomyopathy, unspecified: Secondary | ICD-10-CM | POA: Diagnosis present

## 2017-06-20 DIAGNOSIS — Z7951 Long term (current) use of inhaled steroids: Secondary | ICD-10-CM

## 2017-06-20 DIAGNOSIS — I10 Essential (primary) hypertension: Secondary | ICD-10-CM | POA: Diagnosis present

## 2017-06-20 DIAGNOSIS — D509 Iron deficiency anemia, unspecified: Secondary | ICD-10-CM | POA: Diagnosis not present

## 2017-06-20 DIAGNOSIS — R000 Tachycardia, unspecified: Secondary | ICD-10-CM | POA: Diagnosis not present

## 2017-06-20 DIAGNOSIS — R5383 Other fatigue: Secondary | ICD-10-CM | POA: Diagnosis not present

## 2017-06-20 DIAGNOSIS — K219 Gastro-esophageal reflux disease without esophagitis: Secondary | ICD-10-CM | POA: Diagnosis present

## 2017-06-20 DIAGNOSIS — Z66 Do not resuscitate: Secondary | ICD-10-CM | POA: Diagnosis present

## 2017-06-20 DIAGNOSIS — Z955 Presence of coronary angioplasty implant and graft: Secondary | ICD-10-CM

## 2017-06-20 DIAGNOSIS — E039 Hypothyroidism, unspecified: Secondary | ICD-10-CM | POA: Diagnosis present

## 2017-06-20 DIAGNOSIS — Z79899 Other long term (current) drug therapy: Secondary | ICD-10-CM

## 2017-06-20 DIAGNOSIS — I480 Paroxysmal atrial fibrillation: Secondary | ICD-10-CM | POA: Diagnosis present

## 2017-06-20 DIAGNOSIS — S0990XA Unspecified injury of head, initial encounter: Secondary | ICD-10-CM | POA: Diagnosis not present

## 2017-06-20 DIAGNOSIS — E119 Type 2 diabetes mellitus without complications: Secondary | ICD-10-CM | POA: Diagnosis not present

## 2017-06-20 DIAGNOSIS — E1122 Type 2 diabetes mellitus with diabetic chronic kidney disease: Secondary | ICD-10-CM | POA: Diagnosis present

## 2017-06-20 DIAGNOSIS — R410 Disorientation, unspecified: Secondary | ICD-10-CM | POA: Diagnosis not present

## 2017-06-20 LAB — CBC WITH DIFFERENTIAL/PLATELET
ABS IMMATURE GRANULOCYTES: 0 10*3/uL (ref 0.0–0.1)
BASOS ABS: 0.1 10*3/uL (ref 0.0–0.1)
Basophils Relative: 2 %
EOS PCT: 3 %
Eosinophils Absolute: 0.1 10*3/uL (ref 0.0–0.7)
HCT: 34.9 % — ABNORMAL LOW (ref 36.0–46.0)
Hemoglobin: 11 g/dL — ABNORMAL LOW (ref 12.0–15.0)
IMMATURE GRANULOCYTES: 0 %
LYMPHS ABS: 0.8 10*3/uL (ref 0.7–4.0)
LYMPHS PCT: 21 %
MCH: 27.2 pg (ref 26.0–34.0)
MCHC: 31.5 g/dL (ref 30.0–36.0)
MCV: 86.4 fL (ref 78.0–100.0)
MONO ABS: 0.6 10*3/uL (ref 0.1–1.0)
Monocytes Relative: 16 %
Neutro Abs: 2.2 10*3/uL (ref 1.7–7.7)
Neutrophils Relative %: 58 %
Platelets: 140 10*3/uL — ABNORMAL LOW (ref 150–400)
RBC: 4.04 MIL/uL (ref 3.87–5.11)
RDW: 16.5 % — AB (ref 11.5–15.5)
WBC: 3.7 10*3/uL — ABNORMAL LOW (ref 4.0–10.5)

## 2017-06-20 LAB — COMPREHENSIVE METABOLIC PANEL
ALT: 20 U/L (ref 14–54)
AST: 31 U/L (ref 15–41)
Albumin: 3 g/dL — ABNORMAL LOW (ref 3.5–5.0)
Alkaline Phosphatase: 79 U/L (ref 38–126)
Anion gap: 13 (ref 5–15)
BUN: 24 mg/dL — AB (ref 6–20)
CHLORIDE: 94 mmol/L — AB (ref 101–111)
CO2: 29 mmol/L (ref 22–32)
CREATININE: 5.64 mg/dL — AB (ref 0.44–1.00)
Calcium: 8.8 mg/dL — ABNORMAL LOW (ref 8.9–10.3)
GFR calc non Af Amer: 7 mL/min — ABNORMAL LOW (ref >60)
GFR, EST AFRICAN AMERICAN: 8 mL/min — AB (ref >60)
Glucose, Bld: 80 mg/dL (ref 65–99)
POTASSIUM: 5.7 mmol/L — AB (ref 3.5–5.1)
SODIUM: 136 mmol/L (ref 135–145)
Total Bilirubin: 0.8 mg/dL (ref 0.3–1.2)
Total Protein: 6.7 g/dL (ref 6.5–8.1)

## 2017-06-20 MED ORDER — SODIUM ZIRCONIUM CYCLOSILICATE 5 G PO PACK
10.0000 g | PACK | Freq: Every day | ORAL | Status: DC
  Filled 2017-06-20: qty 2

## 2017-06-20 NOTE — ED Notes (Signed)
Patient transported to CT 

## 2017-06-20 NOTE — ED Provider Notes (Signed)
[facility] EMERGENCY DEPARTMENT Provider Note   CSN: [id number] Arrival date & time: 06/20/17  1904     History   Chief Complaint Chief Complaint  Patient presents with  . Fall    HPI [name] is a 72 y.o. female presenting via EMS after a fall at 4:30 PM today.  Patient states that she was sitting on her bed when she slid off the side onto the floor.  Patient denies lightheadedness or dizziness prior to the fall.  Patient denies loss of consciousness or head injury.  Patient states that she was unable to get up from the floor due to weakness.  Patient denies any pain or injury at this time from the fall.  Patient is on dialysis and received her full treatment today, patient's family in room states that she seems more tired than she normally does after her dialysis treatments.  Family also notes that patient's legs appear to be more swollen than normal.  Patient does endorse right third toe pain from a fracture 3 days ago but states that she has seen podiatrist for this pain.  HPI  Past Medical History:  Diagnosis Date  . Anemia    History  of Blood transfusion  . Asthma    PMH  . Atrial fibrillation with RVR ([place]) 11/2016  . CAD (coronary artery disease)    stent to RCA  . Cancer ([place])    clear cell cancer, kidney  . Complication of anesthesia 12/2010   pt is very confused, with AMS with anesthesia  . CVA (cerebral infarction) 2003   no apparent residual  . Dyslipidemia   . Encephalopathy   . ESRD 07/27/2008   ESRD due to HTN and NSAID's, started hemodialysis in 2005 in [place], [place]. Went to [facility] from 2010 to 2012 and since 2012 has been getting dialysis at [facility] on [address] in [place] on a MWF schedule. First access with RUA AVG placed in [place]. Next and current access was LUA AVG placed by Dr. Lucky Cowboy in [place] in or around 2012. Has had 2 or 3 procedures on that graft since placed per family. She gets her access work  done here in [place] now. She is allergic to heparin and does not get any heparin at dialysis; she had an allergic reaction apparently when in ICU in the past.      . GERD (gastroesophageal reflux disease)   . Hyperlipidemia   . Hypertension   . Hypothyroidism   . Myocardial infarction ([place])    per pt  . Positive PPD    completed rifampin  . Pulmonary hypertension ([place])   . PVD (peripheral vascular disease) ([place])   . Seizures ([place])    last seizure 6 years ago  . Sleep apnea    Sleep Study 2008  . Traumatic seroma of left lower leg ([place])   . Wears partial dentures     Patient Active Problem List   Diagnosis Date Noted  . Cardiomyopathy ([place]) 05/03/2017  . Elevated troponin 04/20/2017  . Infection of hemodialysis tunneled catheter ([place]) 04/19/2017  . Chest pain at rest 04/19/2017  . Long term (current) use of anticoagulants [Z79.01] 12/08/2016  . Atrial fibrillation with RVR ([place]) 11/20/2016  . Precordial pain 10/26/2016  . Hematoma 09/12/2016  . Dyslipidemia 05/25/2016  . Atherosclerosis of native arteries of extremity with intermittent claudication ([place]) 12/16/2014  . Nausea vomiting and diarrhea 09/03/2014  . Hyperkalemia 09/03/2014  . Diarrhea 09/03/2014  . Pulmonary  hypertension ([place]) 09/23/2012  . Hyperkalemia, diminished renal excretion 06/27/2012  . Generalized convulsive epilepsy without mention of intractable epilepsy 09/14/2011  . Epilepsy ([place]) 05/04/2011  . Physical deconditioning 05/04/2011  . HTN (hypertension) 04/30/2011  . Depression 04/30/2011  . GERD (gastroesophageal reflux disease) 04/30/2011  . Acute pulmonary edema ([place]) 04/29/2011  . Nonspecific abnormal electrocardiogram (ECG) (EKG) 02/23/2011  . Essential hypertension, malignant 05/11/2009  . TOBACCO USER 07/27/2008  . Coronary atherosclerosis 07/27/2008  . History of CVA (cerebrovascular accident) 07/27/2008  . PVD 07/27/2008  . ESRD 07/27/2008    Past Surgical History:  Procedure  Laterality Date  . ABDOMINAL AORTOGRAM W/LOWER EXTREMITY Bilateral 06/15/2016   Procedure: Abdominal Aortogram w/Lower Extremity;  Surgeon: Conrad Beaulieu, MD;  Location: [facility];  Service: Cardiovascular;  Laterality: Bilateral;  . ABDOMINAL HYSTERECTOMY    . APPENDECTOMY    . APPLICATION OF WOUND VAC Left 09/12/2016   Procedure: APPLICATION OF WOUND VAC;  Surgeon: Conrad Bude, MD;  Location: [facility];  Service: Vascular;  Laterality: Left;  . ARTERY EXPLORATION Left 07/25/2016   Procedure: ARTERY EXPLORATION LEFT ABOVE KNEE POPLITEAL AND GROIN;  Surgeon: Conrad Cleburne, MD;  Location: [facility];  Service: Vascular;  Laterality: Left;  . AV FISTULA PLACEMENT Left 04/10/2017   Procedure: INSERTION OF ARTERIOVENOUS (AV) GORE-TEX GRAFT REDO LEFT UPPER ARM;  Surgeon: Conrad Heckscherville, MD;  Location: [facility];  Service: Vascular;  Laterality: Left;  . CARDIAC CATHETERIZATION     last 2016  . CHOLECYSTECTOMY    . COLONOSCOPY    . D&Cs    . HEMATOMA EVACUATION Left 09/12/2016   Procedure: EVACUATION HEMATOMA;  Surgeon: Conrad Philipsburg, MD;  Location: [facility];  Service: Vascular;  Laterality: Left;  . INSERTION OF DIALYSIS CATHETER Left 04/17/2017   Procedure: INSERTION OF DIALYSIS CATHETER LEFT GROIN;  Surgeon: Conrad Ideal, MD;  Location: [facility];  Service: Vascular;  Laterality: Left;  . INSERTION OF DIALYSIS CATHETER Right 04/10/2017   Procedure: INSERTION OF DIALYSIS CATHETER;  Surgeon: Conrad Butlertown, MD;  Location[facility] OR;  Service: Vascular;  Laterality: Right;  . IR RADIOLOGIST EVAL & MGMT  03/13/2017  . LEFT HEART CATH AND CORONARY ANGIOGRAPHY N/A 11/22/2016   Procedure: LEFT HEART CATH AND CORONARY ANGIOGRAPHY;  Surgeon: Belva Crome, MD;  Location: [facility];  Service: Cardiovascular;  Laterality: N/A;  . LEFT HEART CATHETERIZATION WITH CORONARY ANGIOGRAM N/A 02/22/2011   Procedure: LEFT HEART CATHETERIZATION WITH CORONARY ANGIOGRAM;  Surgeon: Wellington Hampshire, MD;  Location: [facility] CATH LAB;   Service: Cardiovascular;  Laterality: N/A;  . left nephrectomy    . MULTIPLE TOOTH EXTRACTIONS    . NEPHRECTOMY Left    Malignant tumor  . PERIPHERAL VASCULAR CATHETERIZATION N/A 12/31/2014   Procedure: Abdominal Aortogram;  Surgeon: Conrad [place], MD;  Location: [facility];  Service: Cardiovascular;  Laterality: N/A;  . right ankle repair    . RIGHT HEART CATHETERIZATION N/A 01/29/2014   Procedure: RIGHT HEART CATH;  Surgeon: Larey Dresser, MD;  Location: [facility] CATH LAB;  Service: Cardiovascular;  Laterality: N/A;  . TONSILLECTOMY    . TUBAL LIGATION    . ULTRASOUND GUIDANCE FOR VASCULAR ACCESS  11/22/2016   Procedure: Ultrasound Guidance For Vascular Access;  Surgeon: Belva Crome, MD;  Location: [facility];  Service: Cardiovascular;;  . UPPER EXTREMITY VENOGRAPHY Bilateral 03/22/2017   Procedure: UPPER EXTREMITY VENOGRAPHY;  Surgeon: Conrad [place], MD;  Location: [facility];  Service: Cardiovascular;  Laterality: Bilateral;  bilateral  . VASCULAR SURGERY  11/2010   graft inserted to left arm     OB History   None      Home Medications    Prior to Admission medications   Medication Sig Start Date End Date Taking? Authorizing Provider  acetaminophen (TYLENOL) 500 MG tablet Take 1,000 mg by mouth daily as needed for mild pain.     [provider]  albuterol (PROVENTIL HFA;VENTOLIN HFA) 108 (90 Base) MCG/ACT inhaler Inhale 1-2 puffs into the lungs every 6 (six) hours as needed for wheezing or shortness of breath. 01/05/17   Marney Setting, NP  aspirin EC 81 MG EC tablet Take 1 tablet (81 mg total) by mouth daily. 04/20/17   Debbe Odea, MD  bisacodyl (DULCOLAX) 5 MG EC tablet Take 2 tablets (10 mg total) by mouth daily. 04/27/17   Georgette Shell, MD  camphor-menthol [facility]) lotion Apply topically as needed for itching. 04/26/17   Georgette Shell, MD  cilostazol (PLETAL) 100 MG tablet Take 1 tablet (100 mg total) by mouth 2 (two) times  daily before a meal. 06/19/17   Conrad , MD  Darbepoetin Alfa (ARANESP) 60 MCG/0.3ML SOSY injection Inject 0.3 mLs (60 mcg total) into the skin every Monday with hemodialysis. 04/26/17   Georgette Shell, MD  digoxin (LANOXIN) 0.125 MG tablet Take 1 tablet (0.125 mg total) by mouth 3 (three) times a week. 04/19/17   Debbe Odea, MD  diltiazem (CARDIZEM) 60 MG tablet Take 1 tablet (60 mg total) by mouth 3 (three) times daily. Take BP prior to each dose and Hold if SBP < 90 04/19/17   Debbe Odea, MD  diphenhydrAMINE (BENADRYL) 50 MG capsule Take 1 capsule (50 mg total) by mouth every Monday, Wednesday, and Friday. On dialysis days 09/15/16   Gabriel Earing, PA-C  folic acid-vitamin b complex-vitamin c-selenium-zinc (DIALYVITE) 3 MG TABS tablet Take 1 tablet by mouth daily.    [provider]  HYDROcodone-acetaminophen (NORCO) 5-325 MG tablet Take 1 tablet by mouth every 6 (six) hours as needed for moderate pain. Patient not taking: Reported on 05/16/2017 04/10/17   Dagoberto Ligas, PA-C  LETAIRIS 5 MG tablet TAKE 1 TABLET (5MG ) BY MOUTH DAILY. DO NOT HANDLE IF PREGNANT. DO NOTSPLIT, CRUSH, OR CHEW. AVOID INHALATION AND CONTACT WITH SKIN OR EYES. 09/18/16   Juanito Doom, MD  levETIRAcetam (KEPPRA) 500 MG tablet Take 500 mg by mouth daily at 8 pm.    [provider]  levothyroxine (SYNTHROID, LEVOTHROID) 150 MCG tablet Take 150 mcg by mouth daily before breakfast.    [provider]  metoprolol tartrate (LOPRESSOR) 50 MG tablet TAKE 1 AND 1/2 TABS (75 MG) BY MOUTH 2 (TWO) TIMES DAILY 04/26/17   [provider]  Metoprolol Tartrate 75 MG TABS Take 75 mg by mouth 2 (two) times daily. 04/26/17   Georgette Shell, MD  multivitamin (RENA-VIT) TABS tablet Take 1 tablet by mouth at bedtime. 04/19/17   Debbe Odea, MD  nitroGLYCERIN (NITROSTAT) 0.4 MG SL tablet Place 1 tablet (0.4 mg total) under the tongue every 5 (five) minutes as needed for chest pain.  10/26/16   Minus Breeding, MD  omeprazole (PRILOSEC OTC) 20 MG tablet Take 20 mg by mouth daily.    [provider]  polyethylene glycol (MIRALAX / GLYCOLAX) packet Take 17 g by mouth daily as needed for severe constipation. 04/26/17  Georgette Shell, MD  polyethylene glycol powder (GLYCOLAX/MIRALAX) powder TAKE 17 G BY MOUTH DAILY AS NEEDED FOR SEVERE CONSTIPATION. 04/26/17   [provider]  pravastatin (PRAVACHOL) 40 MG tablet Take 1 tablet (40 mg total) by mouth at bedtime. 05/23/16   Minus Breeding, MD  ranolazine (RANEXA) 500 MG 12 hr tablet Take 1 tablet (500 mg total) by mouth 2 (two) times daily. 04/19/17   Debbe Odea, MD  senna-docusate (SENOKOT-S) 8.6-50 MG tablet Take 2 tablets by mouth at bedtime as needed for mild constipation or moderate constipation. 04/19/17   Debbe Odea, MD  SENSIPAR 90 MG tablet Take 90 mg by mouth every Monday, Wednesday, and Friday with hemodialysis.  03/05/14   [provider]  traZODone (DESYREL) 50 MG tablet Take 50 mg by mouth at bedtime as needed for sleep.     [provider]  warfarin (COUMADIN) 2.5 MG tablet Take 10 mg Monday/wednesday/friday and 7.5 mg other days.check inr Monday April 29th. 04/26/17   Georgette Shell, MD    Family History Family History  Problem Relation Age of Onset  . Heart disease Father        CAD, died age 38  . Hypertension Mother   . Dementia Mother   . Coronary artery disease Sister 54  . Hypertension Brother     Social History Social History   Tobacco Use  . Smoking status: Former Smoker    Packs/day: 0.00    Years: 53.00    Pack years: 0.00    Types: Cigarettes  . Smokeless tobacco: Never Used  . Tobacco comment: quit smoking cigarettes in January 2019  Substance Use Topics  . Alcohol use: Yes    Alcohol/week: 0.0 oz    Comment: very rarely per pt  . Drug use: No    Comment: former marijuana use, several years     Allergies   Ace inhibitors; Heparin;  Iohexol; Phenytoin; Wellbutrin [bupropion]; Meperidine hcl; Morphine; Penicillins; Valproic acid and related; Iodinated diagnostic agents; and Pentazocine lactate   Review of Systems Review of Systems  Constitutional: Positive for fatigue. Negative for chills, diaphoresis and fever.  HENT: Negative.  Negative for congestion, ear pain, rhinorrhea, sore throat and trouble swallowing.   Eyes: Negative.  Negative for pain and visual disturbance.  Respiratory: Negative.  Negative for cough, chest tightness and shortness of breath.   Cardiovascular: Negative.  Negative for chest pain, palpitations and leg swelling.  Gastrointestinal: Negative.  Negative for abdominal pain, blood in stool, diarrhea, nausea and vomiting.  Genitourinary: Negative.  Negative for flank pain and pelvic pain.  Musculoskeletal: Negative.  Negative for arthralgias, myalgias and neck pain.  Skin: Negative.  Negative for rash.       Lower extremity swelling  Neurological: Positive for weakness. Negative for dizziness, syncope, facial asymmetry, speech difficulty, light-headedness, numbness and headaches.     Physical Exam Updated Vital Signs BP (!) 107/93   Pulse (!) 115   Temp 98.1 F (36.7 C) (Oral)   Resp 17   Ht 5\' 2"  (1.575 m)   Wt 77.1 kg (170 lb)   SpO2 97%   BMI 31.09 kg/m   Physical Exam  Constitutional: She is oriented to person, place, and time. She appears well-developed and well-nourished. She appears lethargic.  Non-toxic appearance. No distress.  HENT:  Head: Normocephalic and atraumatic.  Right Ear: External ear normal.  Left Ear: External ear normal.  Nose: Nose normal.  Mouth/Throat: Oropharynx is clear and moist. No oropharyngeal exudate.  Eyes: Pupils are equal, round, and reactive to light. Conjunctivae and EOM are normal.  Neck: Trachea normal, normal range of motion and phonation normal. Neck supple. No JVD present. No spinous process tenderness and no muscular tenderness present. No  neck rigidity. No tracheal deviation and normal range of motion present.  Cardiovascular: Normal heart sounds and intact distal pulses. Tachycardia present. Exam reveals no gallop and no friction rub.  No murmur heard. Tachycardic  Pulmonary/Chest: Effort normal and breath sounds normal. No respiratory distress. She has no wheezes. She has no rales. She exhibits no tenderness.  Abdominal: Soft. Bowel sounds are normal. There is no tenderness. There is no rebound and no guarding.  No signs of trauma to the abdomen.  Musculoskeletal: Normal range of motion.       Right foot: There is no deformity.       Left foot: There is no deformity.  No tenderness to palpation of upper lower extremities, torso, pelvis, neck, back, head, except for third right toe.  Bilateral 1+ pitting edema to the lower extremities.  Feet:  Right Foot:  Protective Sensation: 3 sites tested. 3 sites sensed.  Left Foot:  Protective Sensation: 3 sites tested. 3 sites sensed.  Neurological: She is oriented to person, place, and time. She has normal strength. She appears lethargic. No cranial nerve deficit or sensory deficit. She displays a negative Romberg sign.  Skin: Skin is warm and dry.     ED Treatments / Results  Labs (all labs ordered are listed, but only abnormal results are displayed) Labs Reviewed  CBC WITH DIFFERENTIAL/PLATELET - Abnormal; Notable for the following components:      Result Value   WBC 3.7 (*)    Hemoglobin 11.0 (*)    HCT 34.9 (*)    RDW 16.5 (*)    Platelets 140 (*)    All other components within normal limits  COMPREHENSIVE METABOLIC PANEL - Abnormal; Notable for the following components:   Potassium 5.7 (*)    Chloride 94 (*)    BUN 24 (*)    Creatinine, Ser 5.64 (*)    Calcium 8.8 (*)    Albumin 3.0 (*)    GFR calc non Af Amer 7 (*)    GFR calc Af Amer 8 (*)    All other components within normal limits    EKG EKG Interpretation  Date/Time:  Wednesday June 20 2017  19:14:24 EDT Ventricular Rate:  113 PR Interval:    QRS Duration: 96 QT Interval:  344 QTC Calculation: 472 R Axis:   -123 Text Interpretation:  narrow complex regular tachycardia w unclear origin T wave abnormality Abnormal ekg Confirmed by Carmin Muskrat [phone]) on 06/20/2017 7:20:58 PM   Radiology Dg Chest 2 View  Result Date: 06/20/2017 CLINICAL DATA:  Weakness EXAM: CHEST - 2 VIEW COMPARISON:  04/20/2017 FINDINGS: Shallow inspiration. Cardiac enlargement. Pulmonary vascularity is normal for technique. No definite infiltration or edema. No blunting of costophrenic angles. No pneumothorax. Calcification of the aorta. Degenerative changes in the spine and shoulders. IMPRESSION: Cardiac enlargement. No evidence of active pulmonary disease. Aortic atherosclerosis. Electronically Signed   By: Lucienne Capers M.D.   On: 06/20/2017 21:29   Ct Head Wo Contrast  Result Date: 06/20/2017 CLINICAL DATA:  Patient fell attempting to get out of bed. EXAM: CT HEAD WITHOUT CONTRAST TECHNIQUE: Contiguous axial images were obtained from the base of the skull through the vertex without intravenous contrast. COMPARISON:  08/24/2015 FINDINGS: Brain: Remote infarcts in the left  frontal, parietal, bilateral cerebellar and left occipital lobes as before. No acute intracranial hemorrhage, intra-axial mass nor extra-axial fluid collections. No hydrocephalus. Chronic microvascular ischemic disease. Vascular: No hyperdense vessel sign. Atherosclerosis of the cavernous internal carotids. Skull: No displaced calvarial fracture. Sinuses/Orbits: No acute finding. Other: No significant scalp swelling. IMPRESSION: 1. Chronic stable microvascular ischemic disease and chronic infarcts as above described. 2. No acute intracranial abnormality.  No skull fracture noted. Electronically Signed   By: Ashley Royalty M.D.   On: 06/20/2017 20:57    Procedures Procedures (including critical care time)  Medications Ordered in  ED Medications  sodium zirconium cyclosilicate (LOKELMA) packet 10 g (has no administration in time range)     Initial Impression / Assessment and Plan / ED Course  I have reviewed the triage vital signs and the nursing notes.  Pertinent labs & imaging results that were available during my care of the patient were reviewed by me and considered in my medical decision making (see chart for details).   Patient presenting after fall, near syncope versus mechanical fall.  After fall patient was weak and unable to lift herself off the ground.  Patient is on Monday Wednesday Friday dialysis at dialysis [place] off of [address].  Patient states that she received her for treatment today, however patient is hyperkalemic of 5.7 and lower extremity swelling.  Patient is lethargic in room, falling asleep while talking.  CT head shows no acute abnormalities.  Patient is tachycardic in the 100s.  EKG reviewed by Dr. Vanita Panda.  Patient seen by Dr. Vanita Panda and [facility] Ward, PA-C.  Decision made to admit patient to hospital.   Consult called by Amie Portland, PA-C. to nephrology Dr. Justin Mend who recommended Lokelma 10g to treat hyperkalemia, they will reassess in the morning and possibly take patient to dialysis. Consult called to hospitalist by [facility], PA-C.  Dr. Hal Hope has agreed to admit patient to his service.   Final Clinical Impressions(s) / ED Diagnoses   Final diagnoses:  Weakness  Near syncope  Hyperkalemia    ED Discharge Orders    None       Gari Crown 06/20/17 2232    Carmin Muskrat, MD 06/21/17 1350

## 2017-06-20 NOTE — ED Triage Notes (Signed)
Per EMS, patient fell of the bed this afternoon, when family arrived they reported weakness and confusion that then resolved once she got up from the floor. Patient AOx4 at this time. No LOC, did not hit her head, and no complaints of pain PTA. Patient is on coumadin and has a history of afib.

## 2017-06-20 NOTE — ED Notes (Signed)
Gave pt a [place] sandwich and a coke.

## 2017-06-21 ENCOUNTER — Encounter (HOSPITAL_COMMUNITY): Payer: Self-pay | Admitting: Internal Medicine

## 2017-06-21 ENCOUNTER — Other Ambulatory Visit: Payer: Self-pay

## 2017-06-21 DIAGNOSIS — R55 Syncope and collapse: Secondary | ICD-10-CM | POA: Diagnosis not present

## 2017-06-21 DIAGNOSIS — N186 End stage renal disease: Secondary | ICD-10-CM | POA: Diagnosis not present

## 2017-06-21 DIAGNOSIS — I4891 Unspecified atrial fibrillation: Secondary | ICD-10-CM | POA: Diagnosis present

## 2017-06-21 LAB — CBC
HCT: 33.8 % — ABNORMAL LOW (ref 36.0–46.0)
HEMOGLOBIN: 10.8 g/dL — AB (ref 12.0–15.0)
MCH: 27.5 pg (ref 26.0–34.0)
MCHC: 32 g/dL (ref 30.0–36.0)
MCV: 86 fL (ref 78.0–100.0)
Platelets: 108 10*3/uL — ABNORMAL LOW (ref 150–400)
RBC: 3.93 MIL/uL (ref 3.87–5.11)
RDW: 16.3 % — AB (ref 11.5–15.5)
WBC: 3 10*3/uL — AB (ref 4.0–10.5)

## 2017-06-21 LAB — BASIC METABOLIC PANEL
ANION GAP: 17 — AB (ref 5–15)
BUN: 28 mg/dL — AB (ref 6–20)
CALCIUM: 8.3 mg/dL — AB (ref 8.9–10.3)
CO2: 24 mmol/L (ref 22–32)
CREATININE: 5.99 mg/dL — AB (ref 0.44–1.00)
Chloride: 98 mmol/L — ABNORMAL LOW (ref 101–111)
GFR calc Af Amer: 7 mL/min — ABNORMAL LOW (ref >60)
GFR, EST NON AFRICAN AMERICAN: 6 mL/min — AB (ref >60)
GLUCOSE: 64 mg/dL — AB (ref 65–99)
Potassium: 5.6 mmol/L — ABNORMAL HIGH (ref 3.5–5.1)
Sodium: 139 mmol/L (ref 135–145)

## 2017-06-21 LAB — MAGNESIUM: Magnesium: 1.7 mg/dL (ref 1.7–2.4)

## 2017-06-21 LAB — PROTIME-INR
INR: 1.35
PROTHROMBIN TIME: 16.5 s — AB (ref 11.4–15.2)

## 2017-06-21 LAB — DIGOXIN LEVEL: Digoxin Level: 0.3 ng/mL — ABNORMAL LOW (ref 0.8–2.0)

## 2017-06-21 LAB — MRSA PCR SCREENING: MRSA BY PCR: NEGATIVE

## 2017-06-21 LAB — TROPONIN I: TROPONIN I: 0.04 ng/mL — AB (ref <0.03)

## 2017-06-21 LAB — TSH: TSH: 6.576 u[IU]/mL — ABNORMAL HIGH (ref 0.350–4.500)

## 2017-06-21 MED ORDER — DILTIAZEM HCL 60 MG PO TABS
60.0000 mg | ORAL_TABLET | Freq: Three times a day (TID) | ORAL | Status: DC
  Administered 2017-06-21 – 2017-06-23 (×3): 60 mg via ORAL
  Filled 2017-06-21 (×5): qty 1

## 2017-06-21 MED ORDER — POLYETHYLENE GLYCOL 3350 17 G PO PACK: 17.0000 g | PACK | Freq: Every day | ORAL | Status: DC | PRN

## 2017-06-21 MED ORDER — RENA-VITE PO TABS
1.0000 | ORAL_TABLET | Freq: Every day | ORAL | Status: DC
  Administered 2017-06-21 – 2017-06-22 (×3): 1 via ORAL
  Filled 2017-06-21 (×4): qty 1

## 2017-06-21 MED ORDER — ONDANSETRON HCL 4 MG/2ML IJ SOLN: 4.0000 mg | Freq: Four times a day (QID) | INTRAMUSCULAR | Status: DC | PRN

## 2017-06-21 MED ORDER — SODIUM CHLORIDE 0.9 % IV BOLUS
250.0000 mL | Freq: Once | INTRAVENOUS | Status: AC
  Administered 2017-06-21: 250 mL via INTRAVENOUS

## 2017-06-21 MED ORDER — SENNOSIDES-DOCUSATE SODIUM 8.6-50 MG PO TABS: 2.0000 | ORAL_TABLET | Freq: Every evening | ORAL | Status: DC | PRN

## 2017-06-21 MED ORDER — CILOSTAZOL 100 MG PO TABS
100.0000 mg | ORAL_TABLET | Freq: Two times a day (BID) | ORAL | Status: DC
  Administered 2017-06-21 – 2017-06-23 (×5): 100 mg via ORAL
  Filled 2017-06-21 (×5): qty 1

## 2017-06-21 MED ORDER — NITROGLYCERIN 0.4 MG SL SUBL: 0.4000 mg | SUBLINGUAL_TABLET | SUBLINGUAL | Status: DC | PRN

## 2017-06-21 MED ORDER — WHITE PETROLATUM EX OINT
TOPICAL_OINTMENT | CUTANEOUS | Status: AC
  Administered 2017-06-21: 01:00:00
  Filled 2017-06-21: qty 28.35

## 2017-06-21 MED ORDER — LEVOTHYROXINE SODIUM 75 MCG PO TABS
150.0000 ug | ORAL_TABLET | Freq: Every day | ORAL | Status: DC
  Administered 2017-06-21 – 2017-06-23 (×3): 150 ug via ORAL
  Filled 2017-06-21: qty 1
  Filled 2017-06-21 (×4): qty 2

## 2017-06-21 MED ORDER — WARFARIN - PHARMACIST DOSING INPATIENT
Freq: Every day | Status: DC
  Administered 2017-06-21 – 2017-06-22 (×2)

## 2017-06-21 MED ORDER — TRAZODONE HCL 50 MG PO TABS
50.0000 mg | ORAL_TABLET | Freq: Every evening | ORAL | Status: DC | PRN
  Administered 2017-06-21: 50 mg via ORAL
  Filled 2017-06-21: qty 1

## 2017-06-21 MED ORDER — ONDANSETRON HCL 4 MG PO TABS: 4.0000 mg | ORAL_TABLET | Freq: Four times a day (QID) | ORAL | Status: DC | PRN

## 2017-06-21 MED ORDER — METOPROLOL TARTRATE 25 MG PO TABS
75.0000 mg | ORAL_TABLET | Freq: Two times a day (BID) | ORAL | Status: DC
  Administered 2017-06-21 – 2017-06-23 (×3): 75 mg via ORAL
  Filled 2017-06-21 (×4): qty 3

## 2017-06-21 MED ORDER — PATIROMER SORBITEX CALCIUM 8.4 G PO PACK
8.4000 g | PACK | Freq: Every day | ORAL | Status: DC
  Administered 2017-06-21: 8.4 g via ORAL
  Filled 2017-06-21 (×3): qty 1

## 2017-06-21 MED ORDER — PRAVASTATIN SODIUM 40 MG PO TABS
40.0000 mg | ORAL_TABLET | Freq: Every day | ORAL | Status: DC
  Administered 2017-06-21 – 2017-06-22 (×3): 40 mg via ORAL
  Filled 2017-06-21 (×3): qty 1

## 2017-06-21 MED ORDER — PANTOPRAZOLE SODIUM 40 MG PO TBEC
40.0000 mg | DELAYED_RELEASE_TABLET | Freq: Every day | ORAL | Status: DC
  Administered 2017-06-21 – 2017-06-23 (×3): 40 mg via ORAL
  Filled 2017-06-21 (×3): qty 1

## 2017-06-21 MED ORDER — CINACALCET HCL 30 MG PO TABS
90.0000 mg | ORAL_TABLET | ORAL | Status: DC
  Administered 2017-06-22: 90 mg via ORAL
  Filled 2017-06-21: qty 3

## 2017-06-21 MED ORDER — ACETAMINOPHEN 650 MG RE SUPP: 650.0000 mg | Freq: Four times a day (QID) | RECTAL | Status: DC | PRN

## 2017-06-21 MED ORDER — ASPIRIN EC 81 MG PO TBEC
81.0000 mg | DELAYED_RELEASE_TABLET | Freq: Every day | ORAL | Status: DC
  Administered 2017-06-21 – 2017-06-23 (×3): 81 mg via ORAL
  Filled 2017-06-21 (×3): qty 1

## 2017-06-21 MED ORDER — LEVETIRACETAM 500 MG PO TABS
500.0000 mg | ORAL_TABLET | Freq: Every day | ORAL | Status: DC
  Administered 2017-06-21 – 2017-06-22 (×3): 500 mg via ORAL
  Filled 2017-06-21 (×3): qty 1

## 2017-06-21 MED ORDER — SEVELAMER CARBONATE 800 MG PO TABS: 800.0000 mg | ORAL_TABLET | ORAL | Status: DC | PRN

## 2017-06-21 MED ORDER — ACETAMINOPHEN 325 MG PO TABS
650.0000 mg | ORAL_TABLET | Freq: Four times a day (QID) | ORAL | Status: DC | PRN
  Administered 2017-06-22: 650 mg via ORAL
  Filled 2017-06-21: qty 2

## 2017-06-21 MED ORDER — SEVELAMER CARBONATE 800 MG PO TABS
1600.0000 mg | ORAL_TABLET | Freq: Three times a day (TID) | ORAL | Status: DC
  Administered 2017-06-21 – 2017-06-22 (×3): 1600 mg via ORAL
  Filled 2017-06-21 (×4): qty 2

## 2017-06-21 MED ORDER — AMBRISENTAN 5 MG PO TABS
5.0000 mg | ORAL_TABLET | Freq: Every day | ORAL | Status: DC
  Administered 2017-06-22 – 2017-06-23 (×2): 5 mg via ORAL
  Filled 2017-06-21 (×3): qty 1

## 2017-06-21 MED ORDER — WARFARIN SODIUM 10 MG PO TABS
10.0000 mg | ORAL_TABLET | Freq: Once | ORAL | Status: AC
  Administered 2017-06-21: 10 mg via ORAL
  Filled 2017-06-21: qty 1

## 2017-06-21 MED ORDER — HYDROCODONE-ACETAMINOPHEN 5-325 MG PO TABS: 1.0000 | ORAL_TABLET | Freq: Four times a day (QID) | ORAL | Status: DC | PRN

## 2017-06-21 MED ORDER — CHLORHEXIDINE GLUCONATE CLOTH 2 % EX PADS: 6.0000 | MEDICATED_PAD | Freq: Every day | CUTANEOUS | Status: DC

## 2017-06-21 MED ORDER — TRAMADOL HCL 50 MG PO TABS
50.0000 mg | ORAL_TABLET | Freq: Four times a day (QID) | ORAL | Status: DC | PRN
  Administered 2017-06-21 – 2017-06-23 (×3): 50 mg via ORAL
  Filled 2017-06-21 (×3): qty 1

## 2017-06-21 MED ORDER — DIGOXIN 125 MCG PO TABS
0.1250 mg | ORAL_TABLET | ORAL | Status: DC
  Administered 2017-06-22: 0.125 mg via ORAL
  Filled 2017-06-21: qty 1

## 2017-06-21 MED ORDER — RANOLAZINE ER 500 MG PO TB12
500.0000 mg | ORAL_TABLET | Freq: Two times a day (BID) | ORAL | Status: DC
  Administered 2017-06-21 – 2017-06-23 (×5): 500 mg via ORAL
  Filled 2017-06-21 (×7): qty 1

## 2017-06-21 NOTE — Progress Notes (Signed)
Inpatient Diabetes Program Recommendations  AACE/ADA: New Consensus Statement on Inpatient Glycemic Control (2015)  Target Ranges:  Prepandial:   less than 140 mg/dL      Peak postprandial:   less than 180 mg/dL (1-2 hours)      Critically ill patients:  140 - 180 mg/dL   Lab Results  Component Value Date   GLUCAP 112 (H) 07/26/2016    Review of Glycemic Control Results for AYERIM, BERQUIST (MRN [id number]) as of 06/21/2017 12:26  Ref. Range 06/20/2017 20:58 06/21/2017 05:23  Glucose Latest Ref Range: 65 - 99 mg/dL 80 64 (L)   Diabetes history: Type 2 DM? Only report is per K. Stovall's note from today.  Outpatient Diabetes medications: none Current orders for Inpatient glycemic control: none  Inpatient Diabetes Program Recommendations:    Patient noted to have a low BS of 64 mg/dL. No noted repeat? May want to place orders for glycemic control order set, in order to be checking BS systematically and have appropriate orders in the event of hypoglycemia.   Additionally, no noted A1C, want to order lab for A1C?  Thanks, Bronson Curb, MSN, RNC-OB Diabetes Coordinator [phone] (8a-5p)

## 2017-06-21 NOTE — Progress Notes (Signed)
ANTICOAGULATION CONSULT NOTE  Pharmacy Consult for Warfarin  Indication: atrial fibrillation  Patient Measurements: Height: 5\' 2"  (157.5 cm) Weight: 167 lb 8.8 oz (76 kg) IBW/kg (Calculated) : 50.1  Vital Signs: Temp: 97.9 F (36.6 C) (06/20 0753) Temp Source: Oral (06/20 0753) BP: 90/61 (06/20 0753) Pulse Rate: 79 (06/20 0753)  Labs: Recent Labs    06/20/17 2058 06/21/17 0523  HGB 11.0* 10.8*  HCT 34.9* 33.8*  PLT 140* 108*  LABPROT  --  16.5*  INR  --  1.35  CREATININE 5.64* 5.99*  TROPONINI  --  0.04*    Estimated Creatinine Clearance: 8 mL/min (A) (by C-G formula based on SCr of 5.99 mg/dL (H)).  Assessment: 72 y/o F here with fall/weakness/confusion, CT head negative, on warfarin PTA for afib- home dose if 7.5mg  daily with last dose on 6/19. Admit INR low at 1.35.  Goal of Therapy:  INR 2-3 Monitor platelets by anticoagulation protocol: Yes   Plan:  Warfarin 10mg  po x1 tonight Daily INR Follow for s/s bleeding  Oda Lansdowne D. Keondria Siever, PharmD, BCPS Clinical Pharmacist [phone] Please check AMION for all [place] numbers 06/21/2017 11:19 AM

## 2017-06-21 NOTE — Progress Notes (Signed)
Patient admitted after midnight.  Seen in room.  Will awaken and will answer questions but falls asleep quickly.  Suspect she did not sleep much last night.  Check EKG-- ? A fib-- per records, saw cards last month and was weak/fatigue and they thought perhaps she was not tolerating a fib well- were going to discuss cardioversion/amiodarone but I am not sure what came of those discussion.  For today, PT eval ordered, plan for HD to remove volume but will have to be cautious with BP as low--- on BB and Cardizem.  Echo from April shows:  - Mild global reduction in LV systolic function; severe LVH;   calcified aortic valve with no significant AS; severe MAC with   mild MS and mild MR; moderate biatrial enlargement; mld RVE;   severe TR with moderate to severe pulmonary hypertension; small   pericardial effusion. -if in a fib, may need cardiology consult for clarification -TSH is also higher than normal 1.7--->6.5-- on synthroid  Eulogio Bear DO

## 2017-06-21 NOTE — Progress Notes (Signed)
Report received. Room ready.  

## 2017-06-21 NOTE — H&P (Addendum)
History and Physical    [name] RSW:[id number] DOB: 10/24/1945 DOA: 06/20/2017  PCP: Glendale Chard, MD  Patient coming from: Home.  Chief Complaint: Fall.  History obtained from patient's daughter.  HPI: [name] is a 72 y.o. female with history of atrial fibrillation, pulmonary hypertension, CAD, cardiomyopathy, hypothyroidism, ESRD on hemodialysis was brought to the ER after patient had a fall at home.  As per the daughter patient was trying to reach something over her bed after she reached home following dialysis.  Letter she was found on the floor.  It was not exactly known how she restart to the floor.  When the patient's daughter checked on the patient while she was on the floor she appeared confused and was initially not following commands well.  Patient did not have any incontinence of urine or tongue bite.  Did not complain of any chest pain or shortness of breath.  After around 10 minutes patient was back to baseline.  Patient states he recalls all the incident.  ED Course: In the ER patient was mildly hypotensive initially but improved slowly.  Tachycardic.  CT head was unremarkable.  Patient is afebrile.  On exam patient has significant lower extremity edema which is concerning for the family also since this happened in the last 2 days.  Patient denies chest pain or shortness of breath.  Denies any nausea vomiting or abdominal pain.  Review of Systems: As per HPI, rest all negative.   Past Medical History:  Diagnosis Date  . Anemia    History  of Blood transfusion  . Asthma    PMH  . Atrial fibrillation with RVR ([place]) 11/2016  . CAD (coronary artery disease)    stent to RCA  . Cancer ([place])    clear cell cancer, kidney  . Complication of anesthesia 12/2010   pt is very confused, with AMS with anesthesia  . CVA (cerebral infarction) 2003   no apparent residual  . Dyslipidemia   . Encephalopathy   . ESRD 07/27/2008   ESRD due to HTN and NSAID's, started  hemodialysis in 2005 in [place], [place]. Went to [facility] from 2010 to 2012 and since 2012 has been getting dialysis at [facility] on [address] in [place] on a MWF schedule. First access with RUA AVG placed in [place]. Next and current access was LUA AVG placed by Dr. Lucky Cowboy in [place] in or around 2012. Has had 2 or 3 procedures on that graft since placed per family. She gets her access work done here in [place] now. She is allergic to heparin and does not get any heparin at dialysis; she had an allergic reaction apparently when in ICU in the past.      . GERD (gastroesophageal reflux disease)   . Hyperlipidemia   . Hypertension   . Hypothyroidism   . Myocardial infarction ([place])    per pt  . Positive PPD    completed rifampin  . Pulmonary hypertension ([place])   . PVD (peripheral vascular disease) ([place])   . Seizures ([place])    last seizure 6 years ago  . Sleep apnea    Sleep Study 2008  . Traumatic seroma of left lower leg ([place])   . Wears partial dentures     Past Surgical History:  Procedure Laterality Date  . ABDOMINAL AORTOGRAM W/LOWER EXTREMITY Bilateral 06/15/2016   Procedure: Abdominal Aortogram w/Lower Extremity;  Surgeon: Conrad Gladwin, MD;  Location: [facility];  Service: Cardiovascular;  Laterality: Bilateral;  . ABDOMINAL HYSTERECTOMY    . APPENDECTOMY    . APPLICATION OF WOUND VAC Left 09/12/2016   Procedure: APPLICATION OF WOUND VAC;  Surgeon: Conrad Dunkirk, MD;  Location: [facility];  Service: Vascular;  Laterality: Left;  . ARTERY EXPLORATION Left 07/25/2016   Procedure: ARTERY EXPLORATION LEFT ABOVE KNEE POPLITEAL AND GROIN;  Surgeon: Conrad Furnas, MD;  Location: [facility];  Service: Vascular;  Laterality: Left;  . AV FISTULA PLACEMENT Left 04/10/2017   Procedure: INSERTION OF ARTERIOVENOUS (AV) GORE-TEX GRAFT REDO LEFT UPPER ARM;  Surgeon: Conrad Lebanon, MD;  Location: [facility];  Service: Vascular;  Laterality: Left;  . CARDIAC CATHETERIZATION     last 2016  .  CHOLECYSTECTOMY    . COLONOSCOPY    . D&Cs    . HEMATOMA EVACUATION Left 09/12/2016   Procedure: EVACUATION HEMATOMA;  Surgeon: Conrad Hamilton, MD;  Location: [facility];  Service: Vascular;  Laterality: Left;  . INSERTION OF DIALYSIS CATHETER Left 04/17/2017   Procedure: INSERTION OF DIALYSIS CATHETER LEFT GROIN;  Surgeon: Conrad Mount Ephraim, MD;  Location: [facility];  Service: Vascular;  Laterality: Left;  . INSERTION OF DIALYSIS CATHETER Right 04/10/2017   Procedure: INSERTION OF DIALYSIS CATHETER;  Surgeon: Conrad Moorhead, MD;  Location: [facility] OR;  Service: Vascular;  Laterality: Right;  . IR RADIOLOGIST EVAL & MGMT  03/13/2017  . LEFT HEART CATH AND CORONARY ANGIOGRAPHY N/A 11/22/2016   Procedure: LEFT HEART CATH AND CORONARY ANGIOGRAPHY;  Surgeon: Belva Crome, MD;  Location: [facility];  Service: Cardiovascular;  Laterality: N/A;  . LEFT HEART CATHETERIZATION WITH CORONARY ANGIOGRAM N/A 02/22/2011   Procedure: LEFT HEART CATHETERIZATION WITH CORONARY ANGIOGRAM;  Surgeon: Wellington Hampshire, MD;  Location: [facility];  Service: Cardiovascular;  Laterality: N/A;  . left nephrectomy    . MULTIPLE TOOTH EXTRACTIONS    . NEPHRECTOMY Left    Malignant tumor  . PERIPHERAL VASCULAR CATHETERIZATION N/A 12/31/2014   Procedure: Abdominal Aortogram;  Surgeon: Conrad Stockham, MD;  Location: [facility];  Service: Cardiovascular;  Laterality: N/A;  . right ankle repair    . RIGHT HEART CATHETERIZATION N/A 01/29/2014   Procedure: RIGHT HEART CATH;  Surgeon: Larey Dresser, MD;  Location[facility];  Service: Cardiovascular;  Laterality: N/A;  . TONSILLECTOMY    . TUBAL LIGATION    . ULTRASOUND GUIDANCE FOR VASCULAR ACCESS  11/22/2016   Procedure: Ultrasound Guidance For Vascular Access;  Surgeon: Belva Crome, MD;  Location: [facility];  Service: Cardiovascular;;  . UPPER EXTREMITY VENOGRAPHY Bilateral 03/22/2017   Procedure: UPPER EXTREMITY VENOGRAPHY;  Surgeon: Conrad , MD;  Location: [facility];  Service: Cardiovascular;  Laterality: Bilateral;  bilateral  . VASCULAR SURGERY  11/2010   graft inserted to left arm     reports that she has quit smoking. Her smoking use included cigarettes. She smoked 0.00 packs per day for 53.00 years. She has never used smokeless tobacco. She reports that she drinks alcohol. She reports that she does not use drugs.  Allergies  Allergen Reactions  . Ace Inhibitors Anaphylaxis and Rash  . Heparin Other (See Comments)    MDs told her not to take after reaction in ICU. Tolerates Lovenox fine  . Iohexol Swelling and Other (See Comments)    1970s; passed out and had facial/tongue swelling.  Requires 13-hour prep with prednisone and benadryl  . Phenytoin Other (See  Comments)    Had reaction while in ICU; doesn't know.  MDs told her not to take ever again.  . Wellbutrin [Bupropion] Other (See Comments)    seizures  . Meperidine Hcl Swelling and Rash    Makes tongue swell  . Morphine Rash  . Penicillins Hives and Rash    Has patient had a PCN reaction causing immediate rash, facial/tongue/throat swelling, SOB or lightheadedness with hypotension: Yes Has patient had a PCN reaction causing severe rash involving mucus membranes or skin necrosis: No Has patient had a PCN reaction that required hospitalization: No Has patient had a PCN reaction occurring within the last 10 years: No If all of the above answers are "NO", then may proceed with Cephalosporin use.    . Valproic Acid And Related Other (See Comments)    Confusion   . Iodinated Diagnostic Agents Other (See Comments)    Pre-meditate with benadryl and prednisone 3 times before appt.  . Pentazocine Lactate Other (See Comments)    Patient does not remember reaction to this med ([place]).     Family History  Problem Relation Age of Onset  . Heart disease Father        CAD, died age 2  . Hypertension Mother   . Dementia Mother   . Coronary artery disease Sister 51  .  Hypertension Brother     Prior to Admission medications   Medication Sig Start Date End Date Taking? Authorizing Provider  acetaminophen (TYLENOL) 500 MG tablet Take 1,000 mg by mouth daily as needed for mild pain.    Yes [provider]  albuterol (PROVENTIL HFA;VENTOLIN HFA) 108 (90 Base) MCG/ACT inhaler Inhale 1-2 puffs into the lungs every 6 (six) hours as needed for wheezing or shortness of breath. 01/05/17  Yes Marney Setting, NP  aspirin EC 81 MG EC tablet Take 1 tablet (81 mg total) by mouth daily. 04/20/17  Yes Debbe Odea, MD  camphor-menthol [facility]) lotion Apply topically as needed for itching. Patient taking differently: Apply 1 application topically daily as needed for itching.  04/26/17  Yes Georgette Shell, MD  cilostazol (PLETAL) 100 MG tablet Take 1 tablet (100 mg total) by mouth 2 (two) times daily before a meal. 06/19/17  Yes Conrad Lauderdale, MD  Darbepoetin Alfa (ARANESP) 60 MCG/0.3ML SOSY injection Inject 0.3 mLs (60 mcg total) into the skin every Monday with hemodialysis. 04/26/17  Yes Georgette Shell, MD  digoxin (LANOXIN) 0.125 MG tablet Take 1 tablet (0.125 mg total) by mouth 3 (three) times a week. 04/19/17  Yes Debbe Odea, MD  diltiazem (CARDIZEM) 60 MG tablet Take 1 tablet (60 mg total) by mouth 3 (three) times daily. Take BP prior to each dose and Hold if SBP < 90 04/19/17  Yes Debbe Odea, MD  diphenhydrAMINE (BENADRYL) 50 MG capsule Take 1 capsule (50 mg total) by mouth every Monday, Wednesday, and Friday. On dialysis days 09/15/16  Yes Rhyne, Hulen Shouts, PA-C  HYDROcodone-acetaminophen (NORCO) 5-325 MG tablet Take 1 tablet by mouth every 6 (six) hours as needed for moderate pain. 04/10/17  Yes Eveland, Matthew, PA-C  LETAIRIS 5 MG tablet TAKE 1 TABLET (5MG ) BY MOUTH DAILY. DO NOT HANDLE IF PREGNANT. DO NOTSPLIT, CRUSH, OR CHEW. AVOID INHALATION AND CONTACT WITH SKIN OR EYES. 09/18/16  Yes Juanito Doom, MD  levETIRAcetam (KEPPRA) 500 MG tablet  Take 500 mg by mouth daily at 8 pm.   Yes [provider]  levothyroxine (SYNTHROID, LEVOTHROID) 150 MCG tablet  Take 150 mcg by mouth daily before breakfast.   Yes [provider]  Metoprolol Tartrate 75 MG TABS Take 75 mg by mouth 2 (two) times daily. 04/26/17  Yes Georgette Shell, MD  multivitamin (RENA-VIT) TABS tablet Take 1 tablet by mouth at bedtime. 04/19/17  Yes Debbe Odea, MD  nitroGLYCERIN (NITROSTAT) 0.4 MG SL tablet Place 1 tablet (0.4 mg total) under the tongue every 5 (five) minutes as needed for chest pain. 10/26/16  Yes Minus Breeding, MD  omeprazole (PRILOSEC OTC) 20 MG tablet Take 20 mg by mouth daily.   Yes [provider]  polyethylene glycol (MIRALAX / GLYCOLAX) packet Take 17 g by mouth daily as needed for severe constipation. 04/26/17  Yes Georgette Shell, MD  polyethylene glycol powder (GLYCOLAX/MIRALAX) powder TAKE 17 G BY MOUTH DAILY AS NEEDED FOR SEVERE CONSTIPATION. 04/26/17  Yes [provider]  pravastatin (PRAVACHOL) 40 MG tablet Take 1 tablet (40 mg total) by mouth at bedtime. 05/23/16  Yes Minus Breeding, MD  ranolazine (RANEXA) 500 MG 12 hr tablet Take 1 tablet (500 mg total) by mouth 2 (two) times daily. 04/19/17  Yes Debbe Odea, MD  senna-docusate (SENOKOT-S) 8.6-50 MG tablet Take 2 tablets by mouth at bedtime as needed for mild constipation or moderate constipation. 04/19/17  Yes Debbe Odea, MD  SENSIPAR 90 MG tablet Take 90 mg by mouth every Monday, Wednesday, and Friday with hemodialysis.  03/05/14  Yes [provider]  sevelamer carbonate (RENVELA) 800 MG tablet Take 800-1,600 mg by mouth See admin instructions. Take 1 tablet with snacks and 2 tablets with meals   Yes [provider]  traMADol (ULTRAM) 50 MG tablet Take 50 mg by mouth every 6 (six) hours as needed for moderate pain.   Yes [provider]  traZODone (DESYREL) 50 MG tablet Take 50 mg by mouth at bedtime as needed for sleep.     Yes [provider]  warfarin (COUMADIN) 2.5 MG tablet Take 10 mg Monday/wednesday/friday and 7.5 mg other days.check inr Monday April 29th. Patient taking differently: Take 7.5 mg by mouth daily.  04/26/17  Yes Georgette Shell, MD  bisacodyl (DULCOLAX) 5 MG EC tablet Take 2 tablets (10 mg total) by mouth daily. Patient not taking: Reported on 06/20/2017 04/27/17   Georgette Shell, MD    Physical Exam: Vitals:   06/20/17 2200 06/20/17 2215 06/20/17 2230 06/20/17 2245  BP: 100/63 108/69 111/70 106/68  Pulse: (!) 116 (!) 118 (!) 118 (!) 114  Resp: 20  18 18   Temp:      TempSrc:      SpO2: 96% 94% 92% 93%  Weight:      Height:          Constitutional: Moderately built and nourished. Vitals:   06/20/17 2200 06/20/17 2215 06/20/17 2230 06/20/17 2245  BP: 100/63 108/69 111/70 106/68  Pulse: (!) 116 (!) 118 (!) 118 (!) 114  Resp: 20  18 18   Temp:      TempSrc:      SpO2: 96% 94% 92% 93%  Weight:      Height:       Eyes: Anicteric no pallor. ENMT: No discharge from the ears eyes nose or mouth. Neck: No mass palpated no JVD appreciated. Respiratory: No rhonchi or crepitations. Cardiovascular: S1-S2 heard tachycardic. Abdomen: Soft nontender bowel sounds present. Musculoskeletal: Bilateral lower extremity edema extend into the thigh. Skin: No rash. Neurologic: Alert awake oriented to time place and person.  Moves  all extremities. Psychiatric: Appears normal.  Normal affect.   Labs on Admission: I have personally reviewed following labs and imaging studies  CBC: Recent Labs  Lab 06/20/17 2058  WBC 3.7*  NEUTROABS 2.2  HGB 11.0*  HCT 34.9*  MCV 86.4  PLT 193*   Basic Metabolic Panel: Recent Labs  Lab 06/20/17 2058  NA 136  K 5.7*  CL 94*  CO2 29  GLUCOSE 80  BUN 24*  CREATININE 5.64*  CALCIUM 8.8*   GFR: Estimated Creatinine Clearance: 8.5 mL/min (A) (by C-G formula based on SCr of 5.64 mg/dL (H)). Liver Function Tests: Recent Labs    Lab 06/20/17 2058  AST 31  ALT 20  ALKPHOS 79  BILITOT 0.8  PROT 6.7  ALBUMIN 3.0*   No results for input(s): LIPASE, AMYLASE in the last 168 hours. No results for input(s): AMMONIA in the last 168 hours. Coagulation Profile: No results for input(s): INR, PROTIME in the last 168 hours. Cardiac Enzymes: No results for input(s): CKTOTAL, CKMB, CKMBINDEX, TROPONINI in the last 168 hours. BNP (last 3 results) No results for input(s): PROBNP in the last 8760 hours. HbA1C: No results for input(s): HGBA1C in the last 72 hours. CBG: No results for input(s): GLUCAP in the last 168 hours. Lipid Profile: No results for input(s): CHOL, HDL, LDLCALC, TRIG, CHOLHDL, LDLDIRECT in the last 72 hours. Thyroid Function Tests: No results for input(s): TSH, T4TOTAL, FREET4, T3FREE, THYROIDAB in the last 72 hours. Anemia Panel: No results for input(s): VITAMINB12, FOLATE, FERRITIN, TIBC, IRON, RETICCTPCT in the last 72 hours. Urine analysis:    Component Value Date/Time   COLORURINE YELLOW 09/03/2014 0018   APPEARANCEUR CLOUDY (A) 09/03/2014 0018   LABSPEC 1.010 09/03/2014 0018   PHURINE 5.5 09/03/2014 0018   GLUCOSEU 250 (A) 09/03/2014 0018   HGBUR TRACE (A) 09/03/2014 0018   BILIRUBINUR NEGATIVE 09/03/2014 0018   KETONESUR NEGATIVE 09/03/2014 0018   PROTEINUR 100 (A) 09/03/2014 0018   UROBILINOGEN 0.2 09/03/2014 0018   NITRITE NEGATIVE 09/03/2014 0018   LEUKOCYTESUR NEGATIVE 09/03/2014 0018   Sepsis Labs: @LABRCNTIP (procalcitonin:4,lacticidven:4) )No results found for this or any previous visit (from the past 240 hour(s)).   Radiological Exams on Admission: Dg Chest 2 View  Result Date: 06/20/2017 CLINICAL DATA:  Weakness EXAM: CHEST - 2 VIEW COMPARISON:  04/20/2017 FINDINGS: Shallow inspiration. Cardiac enlargement. Pulmonary vascularity is normal for technique. No definite infiltration or edema. No blunting of costophrenic angles. No pneumothorax. Calcification of the aorta.  Degenerative changes in the spine and shoulders. IMPRESSION: Cardiac enlargement. No evidence of active pulmonary disease. Aortic atherosclerosis. Electronically Signed   By: Lucienne Capers M.D.   On: 06/20/2017 21:29   Ct Head Wo Contrast  Result Date: 06/20/2017 CLINICAL DATA:  Patient fell attempting to get out of bed. EXAM: CT HEAD WITHOUT CONTRAST TECHNIQUE: Contiguous axial images were obtained from the base of the skull through the vertex without intravenous contrast. COMPARISON:  08/24/2015 FINDINGS: Brain: Remote infarcts in the left frontal, parietal, bilateral cerebellar and left occipital lobes as before. No acute intracranial hemorrhage, intra-axial mass nor extra-axial fluid collections. No hydrocephalus. Chronic microvascular ischemic disease. Vascular: No hyperdense vessel sign. Atherosclerosis of the cavernous internal carotids. Skull: No displaced calvarial fracture. Sinuses/Orbits: No acute finding. Other: No significant scalp swelling. IMPRESSION: 1. Chronic stable microvascular ischemic disease and chronic infarcts as above described. 2. No acute intracranial abnormality.  No skull fracture noted. Electronically Signed   By: Ashley Royalty M.D.   On: 06/20/2017 20:57  EKG: Independently reviewed.  Tachycardia narrow complex regular.  Assessment/Plan Principal Problem:   Fluid overload Active Problems:   ESRD   HTN (hypertension)   Epilepsy (HCC)   Pulmonary hypertension (HCC)   Unspecified atrial fibrillation ([place])    1. Fluid overload with history of ESRD -patient is not in respiratory distress will consult nephrology in a.m. for dialysis.  Closely observe in telemetry. 2. Fall/near syncope -patient does have history of epilepsy.  But at this time since patient states he recalls the whole incident unlikely to be seizures.  Patient's heart rate is slightly increased and was mildly hypotensive on presentation.  Closely observe in telemetry.  We will cycle cardiac  markers. 3. Atrial fibrillation with tachycardia heart rate mildly elevated -I have ordered patient's home dose of metoprolol and Cardizem 1 dose now which is due.  Patient is on Coumadin per pharmacy.  If heart rate persistently remains high may need Cardizem infusion.  Check digoxin level and TSH. 4. Epilepsy on Keppra.  See #2. 5. Pulmonary hypertension on Letairis. 6. CAD denies any chest pain.  Check troponin. 7. Cardiomyopathy last EF measured in April 2019 was around 40 to 45%.  Volume management per dialysis. 8. Hypothyroidism on Synthroid. 9. Anemia likely from ESRD.  Follow CBC.  DVT prophylaxis: Coumadin. Code Status: DNR. Family Communication: Patient's daughter. Disposition Plan: Home. Consults called: None. Admission status: Observation.   Rise Patience MD [facility] Pager [phone].  If 7PM-7AM, please contact night-coverage [email or web address] Password [email or web address]  06/21/2017, 12:09 AM

## 2017-06-21 NOTE — Progress Notes (Signed)
New Admission Note:  Arrival Method: Stretcher Mental Orientation: Alert and oriented x 4 Telemetry: Box 21 Assessment: Completed Skin: Warm and dry. IV: NSL Pain: Denies  Tubes: N/A Safety Measures: Safety Fall Prevention Plan initiated.  Admission: Completed 5 M  Orientation: Patient has been orientated to the room, unit and the staff. Family: DTRS Orders have been reviewed and implemented. Will continue to monitor the patient. Call light has been placed within reach and bed alarm has been activated.   Sima Matas BSN, RN  Phone Number: [phone]

## 2017-06-21 NOTE — Progress Notes (Signed)
   06/21/17 0433  Vitals  BP (!) 75/49  MAP (mmHg) (!) 58  NP made aware. Patient feels dizzy. Will continue to monitor.

## 2017-06-21 NOTE — Consult Note (Addendum)
[facility] Renal Consultation Note    Indication for Consultation:  Management of ESRD/hemodialysis, anemia, hypertension/volume, and secondary hyperparathyroidism. PCP:  HPI: [name] is a 72 y.o. female with ESRD, CAD, T2DM, Hx CVA, A-fib (on warfarin), severe PAD, Hx RCC, hypothyroidism, and GERD who was admitted after a fall from bed yesterday.  Ms. Liebert tells me she fell out of bed yesterday and could not get up. Her daughter felt like she was acting confused and brought her to the ED. There, found to be slightly hypotensive. Head CT negative. Labs showed K 5.7, Na 136, WBC 3.7, Hgb 11, Trop 0.04. She was admitted under OBS status. Today, denies CP but + mild SOB and edema, feels like she needs another HD. No fever or chills, denies confusion.  Dialyzes MWF at [facility], last HD was 6/19. She completed 3:30hr of 4hr treatment, left approx 3.4kg above her HD. She has been signing off HD lately with c/o severe B leg pains, known severe PAD (s/p prior revascularization - not much option for further treatment per last vascular note regarding PAD). Using L AVG for access, tells me that there have been cannulation issues recently? Will follow.  Mechanical fall  - some mild failure and possibly underdialysis   Past Medical History:  Diagnosis Date  . Anemia    History  of Blood transfusion  . Asthma    PMH  . Atrial fibrillation with RVR ([place]) 11/2016  . CAD (coronary artery disease)    stent to RCA  . Cancer ([place])    clear cell cancer, kidney  . Complication of anesthesia 12/2010   pt is very confused, with AMS with anesthesia  . CVA (cerebral infarction) 2003   no apparent residual  . Dyslipidemia   . Encephalopathy   . ESRD 07/27/2008   ESRD due to HTN and NSAID's, started hemodialysis in 2005 in [place], [place]. Went to [facility] from 2010 to 2012 and since 2012 has been getting dialysis at [facility] on [address] in [place] on a MWF  schedule. First access with RUA AVG placed in [place]. Next and current access was LUA AVG placed by Dr. Lucky Cowboy in [place] in or around 2012. Has had 2 or 3 procedures on that graft since placed per family. She gets her access work done here in [place] now. She is allergic to heparin and does not get any heparin at dialysis; she had an allergic reaction apparently when in ICU in the past.      . GERD (gastroesophageal reflux disease)   . Hyperlipidemia   . Hypertension   . Hypothyroidism   . Myocardial infarction ([place])    per pt  . Positive PPD    completed rifampin  . Pulmonary hypertension ([place])   . PVD (peripheral vascular disease) ([place])   . Seizures ([place])    last seizure 6 years ago  . Sleep apnea    Sleep Study 2008  . Traumatic seroma of left lower leg ([place])   . Wears partial dentures    Past Surgical History:  Procedure Laterality Date  . ABDOMINAL AORTOGRAM W/LOWER EXTREMITY Bilateral 06/15/2016   Procedure: Abdominal Aortogram w/Lower Extremity;  Surgeon: Conrad [place], MD;  Location: [place] [facility];  Service: Cardiovascular;  Laterality: Bilateral;  . ABDOMINAL HYSTERECTOMY    . APPENDECTOMY    . APPLICATION OF WOUND VAC Left 09/12/2016   Procedure: APPLICATION OF WOUND VAC;  Surgeon: Conrad [place], MD;  Location: [facility]  OR;  Service: Vascular;  Laterality: Left;  . ARTERY EXPLORATION Left 07/25/2016   Procedure: ARTERY EXPLORATION LEFT ABOVE KNEE POPLITEAL AND GROIN;  Surgeon: Conrad Venice, MD;  Location: [place];  Service: Vascular;  Laterality: Left;  . AV FISTULA PLACEMENT Left 04/10/2017   Procedure: INSERTION OF ARTERIOVENOUS (AV) GORE-TEX GRAFT REDO LEFT UPPER ARM;  Surgeon: Conrad [place], MD;  Location: [place];  Service: Vascular;  Laterality: Left;  . CARDIAC CATHETERIZATION     last 2016  . CHOLECYSTECTOMY    . COLONOSCOPY    . D&Cs    . HEMATOMA EVACUATION Left 09/12/2016   Procedure: EVACUATION HEMATOMA;  Surgeon: Conrad Masury, MD;  Location: [place];  Service:  Vascular;  Laterality: Left;  . INSERTION OF DIALYSIS CATHETER Left 04/17/2017   Procedure: INSERTION OF DIALYSIS CATHETER LEFT GROIN;  Surgeon: Conrad Glencoe, MD;  Location: [place];  Service: Vascular;  Laterality: Left;  . INSERTION OF DIALYSIS CATHETER Right 04/10/2017   Procedure: INSERTION OF DIALYSIS CATHETER;  Surgeon: Conrad Mason Neck, MD;  Location: [facility] OR;  Service: Vascular;  Laterality: Right;  . IR RADIOLOGIST EVAL & MGMT  03/13/2017  . LEFT HEART CATH AND CORONARY ANGIOGRAPHY N/A 11/22/2016   Procedure: LEFT HEART CATH AND CORONARY ANGIOGRAPHY;  Surgeon: Belva Crome, MD;  Location: [place] [facility];  Service: Cardiovascular;  Laterality: N/A;  . LEFT HEART CATHETERIZATION WITH CORONARY ANGIOGRAM N/A 02/22/2011   Procedure: LEFT HEART CATHETERIZATION WITH CORONARY ANGIOGRAM;  Surgeon: Wellington Hampshire, MD;  Location: [place] CATH LAB;  Service: Cardiovascular;  Laterality: N/A;  . left nephrectomy    . MULTIPLE TOOTH EXTRACTIONS    . NEPHRECTOMY Left    Malignant tumor  . PERIPHERAL VASCULAR CATHETERIZATION N/A 12/31/2014   Procedure: Abdominal Aortogram;  Surgeon: Conrad Wheaton, MD;  Location: [place] [facility];  Service: Cardiovascular;  Laterality: N/A;  . right ankle repair    . RIGHT HEART CATHETERIZATION N/A 01/29/2014   Procedure: RIGHT HEART CATH;  Surgeon: Larey Dresser, MD;  Location: [facility] CATH LAB;  Service: Cardiovascular;  Laterality: N/A;  . TONSILLECTOMY    . TUBAL LIGATION    . ULTRASOUND GUIDANCE FOR VASCULAR ACCESS  11/22/2016   Procedure: Ultrasound Guidance For Vascular Access;  Surgeon: Belva Crome, MD;  Location: [place] [facility];  Service: Cardiovascular;;  . UPPER EXTREMITY VENOGRAPHY Bilateral 03/22/2017   Procedure: UPPER EXTREMITY VENOGRAPHY;  Surgeon: Conrad Canyon Lake, MD;  Location: [place] CV [facility];  Service: Cardiovascular;  Laterality: Bilateral;  bilateral  . VASCULAR SURGERY  11/2010   graft inserted to left arm   Family History  Problem Relation  Age of Onset  . Heart disease Father        CAD, died age 29  . Hypertension Mother   . Dementia Mother   . Coronary artery disease Sister 7  . Hypertension Brother    Social History:  reports that she has quit smoking. Her smoking use included cigarettes. She smoked 0.00 packs per day for 53.00 years. She has never used smokeless tobacco. She reports that she drinks alcohol. She reports that she does not use drugs.  ROS: As per HPI otherwise negative.  Physical Exam: Vitals:   06/21/17 0052 06/21/17 0433 06/21/17 0739 06/21/17 0753  BP: 100/64 (!) 75/49 (!) 87/55 90/61  Pulse: (!) 118 60 80 79  Resp: 20 18  20   Temp: 97.8 F (36.6 C) 97.8 F (36.6 C)  97.9 F (36.6 C)  TempSrc: Oral Oral  Oral  SpO2: 98% 97%  93%  Weight: 76 kg (167 lb 8.8 oz)     Height: 5\' 2"  (1.575 m)        General: Frail female, NAD Head: Normocephalic, atraumatic, sclera non-icteric, mucus membranes are moist. Neck: Supple without lymphadenopathy/masses.  Lungs: Clear bilaterally to auscultation without wheezes, rales, or rhonchi.  Heart: RRR with normal S1, S2. No murmurs, rubs, or gallops appreciated. Abdomen: Soft, non-tender, non-distended with normoactive bowel sounds. No rebound/guarding. No obvious abdominal masses. Musculoskeletal:  Strength and tone appear normal for age. Lower extremities: 2+ B LE edema Neuro: Alert and oriented X 3. Moves all extremities spontaneously. Psych:  Responds to questions appropriately with a normal affect. Dialysis Access: AVG + bruit  Allergies  Allergen Reactions  . Ace Inhibitors Anaphylaxis and Rash  . Heparin Other (See Comments)    MDs told her not to take after reaction in ICU. Tolerates Lovenox fine  . Iohexol Swelling and Other (See Comments)    1970s; passed out and had facial/tongue swelling.  Requires 13-hour prep with prednisone and benadryl  . Phenytoin Other (See Comments)    Had reaction while in ICU; doesn't know.  MDs told her not to take  ever again.  . Wellbutrin [Bupropion] Other (See Comments)    seizures  . Meperidine Hcl Swelling and Rash    Makes tongue swell  . Morphine Rash  . Penicillins Hives and Rash    Has patient had a PCN reaction causing immediate rash, facial/tongue/throat swelling, SOB or lightheadedness with hypotension: Yes Has patient had a PCN reaction causing severe rash involving mucus membranes or skin necrosis: No Has patient had a PCN reaction that required hospitalization: No Has patient had a PCN reaction occurring within the last 10 years: No If all of the above answers are "NO", then may proceed with Cephalosporin use.    . Valproic Acid And Related Other (See Comments)    Confusion   . Iodinated Diagnostic Agents Other (See Comments)    Pre-meditate with benadryl and prednisone 3 times before appt.  . Pentazocine Lactate Other (See Comments)    Patient does not remember reaction to this med ([place]).    Prior to Admission medications   Medication Sig Start Date End Date Taking? Authorizing Provider  acetaminophen (TYLENOL) 500 MG tablet Take 1,000 mg by mouth daily as needed for mild pain.    Yes [provider]  albuterol (PROVENTIL HFA;VENTOLIN HFA) 108 (90 Base) MCG/ACT inhaler Inhale 1-2 puffs into the lungs every 6 (six) hours as needed for wheezing or shortness of breath. 01/05/17  Yes Marney Setting, NP  aspirin EC 81 MG EC tablet Take 1 tablet (81 mg total) by mouth daily. 04/20/17  Yes Debbe Odea, MD  camphor-menthol [facility]) lotion Apply topically as needed for itching. Patient taking differently: Apply 1 application topically daily as needed for itching.  04/26/17  Yes Georgette Shell, MD  cilostazol (PLETAL) 100 MG tablet Take 1 tablet (100 mg total) by mouth 2 (two) times daily before a meal. 06/19/17  Yes Conrad [place], MD  Darbepoetin Alfa (ARANESP) 60 MCG/0.3ML SOSY injection Inject 0.3 mLs (60 mcg total) into the skin every Monday with hemodialysis. 04/26/17   Yes Georgette Shell, MD  digoxin (LANOXIN) 0.125 MG tablet Take 1 tablet (0.125 mg total) by mouth 3 (three) times a week. 04/19/17  Yes Debbe Odea, MD  diltiazem (CARDIZEM) 60 MG tablet  Take 1 tablet (60 mg total) by mouth 3 (three) times daily. Take BP prior to each dose and Hold if SBP < 90 04/19/17  Yes Debbe Odea, MD  diphenhydrAMINE (BENADRYL) 50 MG capsule Take 1 capsule (50 mg total) by mouth every Monday, Wednesday, and Friday. On dialysis days 09/15/16  Yes Rhyne, Hulen Shouts, PA-C  HYDROcodone-acetaminophen (NORCO) 5-325 MG tablet Take 1 tablet by mouth every 6 (six) hours as needed for moderate pain. 04/10/17  Yes Eveland, Matthew, PA-C  LETAIRIS 5 MG tablet TAKE 1 TABLET (5MG ) BY MOUTH DAILY. DO NOT HANDLE IF PREGNANT. DO NOTSPLIT, CRUSH, OR CHEW. AVOID INHALATION AND CONTACT WITH SKIN OR EYES. 09/18/16  Yes Juanito Doom, MD  levETIRAcetam (KEPPRA) 500 MG tablet Take 500 mg by mouth daily at 8 pm.   Yes [provider]  levothyroxine (SYNTHROID, LEVOTHROID) 150 MCG tablet Take 150 mcg by mouth daily before breakfast.   Yes [provider]  Metoprolol Tartrate 75 MG TABS Take 75 mg by mouth 2 (two) times daily. 04/26/17  Yes Georgette Shell, MD  multivitamin (RENA-VIT) TABS tablet Take 1 tablet by mouth at bedtime. 04/19/17  Yes Debbe Odea, MD  nitroGLYCERIN (NITROSTAT) 0.4 MG SL tablet Place 1 tablet (0.4 mg total) under the tongue every 5 (five) minutes as needed for chest pain. 10/26/16  Yes Minus Breeding, MD  omeprazole (PRILOSEC OTC) 20 MG tablet Take 20 mg by mouth daily.   Yes [provider]  polyethylene glycol (MIRALAX / GLYCOLAX) packet Take 17 g by mouth daily as needed for severe constipation. 04/26/17  Yes Georgette Shell, MD  polyethylene glycol powder (GLYCOLAX/MIRALAX) powder TAKE 17 G BY MOUTH DAILY AS NEEDED FOR SEVERE CONSTIPATION. 04/26/17  Yes [provider]  pravastatin (PRAVACHOL) 40 MG tablet Take 1 tablet  (40 mg total) by mouth at bedtime. 05/23/16  Yes Minus Breeding, MD  ranolazine (RANEXA) 500 MG 12 hr tablet Take 1 tablet (500 mg total) by mouth 2 (two) times daily. 04/19/17  Yes Debbe Odea, MD  senna-docusate (SENOKOT-S) 8.6-50 MG tablet Take 2 tablets by mouth at bedtime as needed for mild constipation or moderate constipation. 04/19/17  Yes Debbe Odea, MD  SENSIPAR 90 MG tablet Take 90 mg by mouth every Monday, Wednesday, and Friday with hemodialysis.  03/05/14  Yes [provider]  sevelamer carbonate (RENVELA) 800 MG tablet Take 800-1,600 mg by mouth See admin instructions. Take 1 tablet with snacks and 2 tablets with meals   Yes [provider]  traMADol (ULTRAM) 50 MG tablet Take 50 mg by mouth every 6 (six) hours as needed for moderate pain.   Yes [provider]  traZODone (DESYREL) 50 MG tablet Take 50 mg by mouth at bedtime as needed for sleep.    Yes [provider]  warfarin (COUMADIN) 2.5 MG tablet Take 10 mg Monday/wednesday/friday and 7.5 mg other days.check inr Monday April 29th. Patient taking differently: Take 7.5 mg by mouth daily.  04/26/17  Yes Georgette Shell, MD  bisacodyl (DULCOLAX) 5 MG EC tablet Take 2 tablets (10 mg total) by mouth daily. Patient not taking: Reported on 06/20/2017 04/27/17   Georgette Shell, MD   Current Facility-Administered Medications  Medication Dose Route Frequency Provider Last Rate Last Dose  . acetaminophen (TYLENOL) tablet 650 mg  650 mg Oral Q6H PRN Rise Patience, MD       Or  . acetaminophen (TYLENOL) suppository 650 mg  650 mg Rectal Q6H PRN  Rise Patience, MD      . ambrisentan Milda Smart) tablet 5 mg  5 mg Oral Daily Rise Patience, MD      . aspirin EC tablet 81 mg  81 mg Oral Daily Rise Patience, MD   81 mg at 06/21/17 0857  . Chlorhexidine Gluconate Cloth 2 % PADS 6 each  6 each Topical Q0600 Loren Racer, PA-C      . cilostazol (PLETAL) tablet 100 mg  100  mg Oral BID AC Rise Patience, MD   100 mg at 06/21/17 0857  . [START ON 06/22/2017] cinacalcet (SENSIPAR) tablet 90 mg  90 mg Oral Q M,W,F-HD Rise Patience, MD      . Derrill Memo ON 06/22/2017] digoxin (LANOXIN) tablet 0.125 mg  0.125 mg Oral Once per day on Mon Wed Fri Rise Patience, MD      . diltiazem (CARDIZEM) tablet 60 mg  60 mg Oral TID Rise Patience, MD   60 mg at 06/21/17 0114  . HYDROcodone-acetaminophen (NORCO/VICODIN) 5-325 MG per tablet 1 tablet  1 tablet Oral Q6H PRN Rise Patience, MD      . levETIRAcetam (KEPPRA) tablet 500 mg  500 mg Oral Q2000 Rise Patience, MD   500 mg at 06/21/17 0113  . levothyroxine (SYNTHROID, LEVOTHROID) tablet 150 mcg  150 mcg Oral QAC breakfast Rise Patience, MD   150 mcg at 06/21/17 0857  . metoprolol tartrate (LOPRESSOR) tablet 75 mg  75 mg Oral BID Rise Patience, MD   75 mg at 06/21/17 0113  . multivitamin (RENA-VIT) tablet 1 tablet  1 tablet Oral QHS Rise Patience, MD   1 tablet at 06/21/17 0113  . nitroGLYCERIN (NITROSTAT) SL tablet 0.4 mg  0.4 mg Sublingual Q5 min PRN Rise Patience, MD      . ondansetron [facility]) tablet 4 mg  4 mg Oral Q6H PRN Rise Patience, MD       Or  . ondansetron [facility]) injection 4 mg  4 mg Intravenous Q6H PRN Rise Patience, MD      . pantoprazole (PROTONIX) EC tablet 40 mg  40 mg Oral Daily Rise Patience, MD   40 mg at 06/21/17 0857  . polyethylene glycol (MIRALAX / GLYCOLAX) packet 17 g  17 g Oral Daily PRN Rise Patience, MD      . pravastatin (PRAVACHOL) tablet 40 mg  40 mg Oral QHS Rise Patience, MD   40 mg at 06/21/17 0113  . ranolazine (RANEXA) 12 hr tablet 500 mg  500 mg Oral BID Rise Patience, MD   500 mg at 06/21/17 0856  . senna-docusate (Senokot-S) tablet 2 tablet  2 tablet Oral QHS PRN Rise Patience, MD      . sevelamer carbonate (RENVELA) tablet 1,600 mg  1,600 mg Oral TID WC Rise Patience, MD    1,600 mg at 06/21/17 0856  . sevelamer carbonate (RENVELA) tablet 800 mg  800 mg Oral PRN Rise Patience, MD      . traMADol Veatrice Bourbon) tablet 50 mg  50 mg Oral Q6H PRN Rise Patience, MD      . traZODone (DESYREL) tablet 50 mg  50 mg Oral QHS PRN Rise Patience, MD       Facility-Administered Medications Ordered in Other Encounters  Medication Dose Route Frequency Provider Last Rate Last Dose  . diphenhydrAMINE (BENADRYL) capsule 50 mg  50 mg Oral Once Aletta Edouard,  MD       Labs: Basic Metabolic Panel: Recent Labs  Lab 06/20/17 2058 06/21/17 0523  NA 136 139  K 5.7* 5.6*  CL 94* 98*  CO2 29 24  GLUCOSE 80 64*  BUN 24* 28*  CREATININE 5.64* 5.99*  CALCIUM 8.8* 8.3*   Liver Function Tests: Recent Labs  Lab 06/20/17 2058  AST 31  ALT 20  ALKPHOS 79  BILITOT 0.8  PROT 6.7  ALBUMIN 3.0*   CBC: Recent Labs  Lab 06/20/17 2058 06/21/17 0523  WBC 3.7* 3.0*  NEUTROABS 2.2  --   HGB 11.0* 10.8*  HCT 34.9* 33.8*  MCV 86.4 86.0  PLT 140* 108*   Cardiac Enzymes: Recent Labs  Lab 06/21/17 0523  TROPONINI 0.04*   Studies/Results: Dg Chest 2 View  Result Date: 06/20/2017 CLINICAL DATA:  Weakness EXAM: CHEST - 2 VIEW COMPARISON:  04/20/2017 FINDINGS: Shallow inspiration. Cardiac enlargement. Pulmonary vascularity is normal for technique. No definite infiltration or edema. No blunting of costophrenic angles. No pneumothorax. Calcification of the aorta. Degenerative changes in the spine and shoulders. IMPRESSION: Cardiac enlargement. No evidence of active pulmonary disease. Aortic atherosclerosis. Electronically Signed   By: Lucienne Capers M.D.   On: 06/20/2017 21:29   Ct Head Wo Contrast  Result Date: 06/20/2017 CLINICAL DATA:  Patient fell attempting to get out of bed. EXAM: CT HEAD WITHOUT CONTRAST TECHNIQUE: Contiguous axial images were obtained from the base of the skull through the vertex without intravenous contrast. COMPARISON:  08/24/2015  FINDINGS: Brain: Remote infarcts in the left frontal, parietal, bilateral cerebellar and left occipital lobes as before. No acute intracranial hemorrhage, intra-axial mass nor extra-axial fluid collections. No hydrocephalus. Chronic microvascular ischemic disease. Vascular: No hyperdense vessel sign. Atherosclerosis of the cavernous internal carotids. Skull: No displaced calvarial fracture. Sinuses/Orbits: No acute finding. Other: No significant scalp swelling. IMPRESSION: 1. Chronic stable microvascular ischemic disease and chronic infarcts as above described. 2. No acute intracranial abnormality.  No skull fracture noted. Electronically Signed   By: Ashley Royalty M.D.   On: 06/20/2017 20:57    Dialysis Orders:  MWF at [facility] 4hr, 450/800, EDW 72kg, 2K/2Ca, UF #4, AVG, no heparin - Calcitriol 2.70mcg PO q HD - Sensipar 120mg  PO q HD - Venofer 50mg  IV weekly, No ESA for now - Binder: Renvela 4/meals.  Assessment/Plan: 1.  Fall (no acute injuries): Per primary. 2.  Mild hyperkalemia: For extra HD today agree that she may benefit for extra treatment. 3.  Edema: + volume excess on exam, for HD today - hopefully hypotension will not preclude much UF. Likely need to reduce BP med doses. 4.  ESRD: Usual MWF schedule. For extra HD, as above, then back to usual MWF sched. 5.  Anemia: Hgb 10.8, no ESA yet. 6.  Metabolic bone disease: Corr Ca ok, continue current binders/meds. 7.  Nutrition: Alb low, adding pro-stat supplement. 8.  Severe PAD 9.  CAD 10.  T2DM 11.  A-fib (on warfarin) 12. Hx seizure disorder 13. Hx pulmonary HTN   I have seen and examined this patient and agree with the plan of care    [facility] 06/21/2017, 3:24 PM      Veneta Penton, PA-C 06/21/2017, 10:07 AM  [facility] Pager: [phone]

## 2017-06-21 NOTE — Progress Notes (Signed)
ANTICOAGULATION CONSULT NOTE - Initial Consult  Pharmacy Consult for Warfarin  Indication: atrial fibrillation  Patient Measurements: Height: 5\' 2"  (157.5 cm) Weight: 170 lb (77.1 kg) IBW/kg (Calculated) : 50.1  Vital Signs: Temp: 98.1 F (36.7 C) (06/19 1919) Temp Source: Oral (06/19 1919) BP: 99/75 (06/20 0015) Pulse Rate: 120 (06/20 0015)  Labs: Recent Labs    06/20/17 2058  HGB 11.0*  HCT 34.9*  PLT 140*  CREATININE 5.64*    Estimated Creatinine Clearance: 8.5 mL/min (A) (by C-G formula based on SCr of 5.64 mg/dL (H)).   Medical History: Past Medical History:  Diagnosis Date  . Anemia    History  of Blood transfusion  . Asthma    PMH  . Atrial fibrillation with RVR ([place]) 11/2016  . CAD (coronary artery disease)    stent to RCA  . Cancer ([place])    clear cell cancer, kidney  . Complication of anesthesia 12/2010   pt is very confused, with AMS with anesthesia  . CVA (cerebral infarction) 2003   no apparent residual  . Dyslipidemia   . Encephalopathy   . ESRD 07/27/2008   ESRD due to HTN and NSAID's, started hemodialysis in 2005 in [place], [place]. Went to [facility] from 2010 to 2012 and since 2012 has been getting dialysis at [facility] on [address] in [place] on a MWF schedule. First access with RUA AVG placed in [place]. Next and current access was LUA AVG placed by Dr. Lucky Cowboy in [place] in or around 2012. Has had 2 or 3 procedures on that graft since placed per family. She gets her access work done here in [place] now. She is allergic to heparin and does not get any heparin at dialysis; she had an allergic reaction apparently when in ICU in the past.      . GERD (gastroesophageal reflux disease)   . Hyperlipidemia   . Hypertension   . Hypothyroidism   . Myocardial infarction ([place])    per pt  . Positive PPD    completed rifampin  . Pulmonary hypertension ([place])   . PVD (peripheral vascular disease) ([place])   . Seizures ([place])    last seizure  6 years ago  . Sleep apnea    Sleep Study 2008  . Traumatic seroma of left lower leg ([place])   . Wears partial dentures    Assessment: 72 y/o F here with fall/weakness/confusion, CT head negative, on warfarin PTA for afib, no INR drawn  Goal of Therapy:  INR 2-3 Monitor platelets by anticoagulation protocol: Yes   Plan:  -INR with AM labs to assess dosing needs  Narda Bonds 06/21/2017,12:30 AM

## 2017-06-21 NOTE — Progress Notes (Signed)
PT Cancellation Note  Patient Details Name: [name] MRN: [id number] DOB: 05/05/1945   Cancelled Treatment:    Reason Eval/Treat Not Completed: PT eval received, chart reviewed. Noted pt currently in HD. Will continue to follow and initiate PT eval when pt is available.   Thelma Comp 06/21/2017, 11:32 AM   Rolinda Roan, PT, DPT Acute Rehabilitation Services Pager: [phone]

## 2017-06-22 ENCOUNTER — Observation Stay (HOSPITAL_COMMUNITY): Payer: Medicare Other

## 2017-06-22 DIAGNOSIS — E785 Hyperlipidemia, unspecified: Secondary | ICD-10-CM | POA: Diagnosis present

## 2017-06-22 DIAGNOSIS — E8770 Fluid overload, unspecified: Secondary | ICD-10-CM | POA: Diagnosis not present

## 2017-06-22 DIAGNOSIS — I272 Pulmonary hypertension, unspecified: Secondary | ICD-10-CM | POA: Diagnosis present

## 2017-06-22 DIAGNOSIS — Z79899 Other long term (current) drug therapy: Secondary | ICD-10-CM | POA: Diagnosis not present

## 2017-06-22 DIAGNOSIS — Z992 Dependence on renal dialysis: Secondary | ICD-10-CM | POA: Diagnosis not present

## 2017-06-22 DIAGNOSIS — N186 End stage renal disease: Secondary | ICD-10-CM | POA: Diagnosis not present

## 2017-06-22 DIAGNOSIS — I959 Hypotension, unspecified: Secondary | ICD-10-CM | POA: Diagnosis present

## 2017-06-22 DIAGNOSIS — G4730 Sleep apnea, unspecified: Secondary | ICD-10-CM | POA: Diagnosis present

## 2017-06-22 DIAGNOSIS — G40909 Epilepsy, unspecified, not intractable, without status epilepticus: Secondary | ICD-10-CM | POA: Diagnosis not present

## 2017-06-22 DIAGNOSIS — K219 Gastro-esophageal reflux disease without esophagitis: Secondary | ICD-10-CM | POA: Diagnosis present

## 2017-06-22 DIAGNOSIS — S99921A Unspecified injury of right foot, initial encounter: Secondary | ICD-10-CM | POA: Diagnosis not present

## 2017-06-22 DIAGNOSIS — E1151 Type 2 diabetes mellitus with diabetic peripheral angiopathy without gangrene: Secondary | ICD-10-CM | POA: Diagnosis present

## 2017-06-22 DIAGNOSIS — M7989 Other specified soft tissue disorders: Secondary | ICD-10-CM | POA: Diagnosis not present

## 2017-06-22 DIAGNOSIS — I2510 Atherosclerotic heart disease of native coronary artery without angina pectoris: Secondary | ICD-10-CM | POA: Diagnosis present

## 2017-06-22 DIAGNOSIS — N2581 Secondary hyperparathyroidism of renal origin: Secondary | ICD-10-CM | POA: Diagnosis present

## 2017-06-22 DIAGNOSIS — I252 Old myocardial infarction: Secondary | ICD-10-CM | POA: Diagnosis not present

## 2017-06-22 DIAGNOSIS — I10 Essential (primary) hypertension: Secondary | ICD-10-CM

## 2017-06-22 DIAGNOSIS — E1122 Type 2 diabetes mellitus with diabetic chronic kidney disease: Secondary | ICD-10-CM | POA: Diagnosis present

## 2017-06-22 DIAGNOSIS — I4891 Unspecified atrial fibrillation: Secondary | ICD-10-CM | POA: Diagnosis not present

## 2017-06-22 DIAGNOSIS — D631 Anemia in chronic kidney disease: Secondary | ICD-10-CM | POA: Diagnosis not present

## 2017-06-22 DIAGNOSIS — E039 Hypothyroidism, unspecified: Secondary | ICD-10-CM | POA: Diagnosis present

## 2017-06-22 DIAGNOSIS — Z955 Presence of coronary angioplasty implant and graft: Secondary | ICD-10-CM | POA: Diagnosis not present

## 2017-06-22 DIAGNOSIS — I480 Paroxysmal atrial fibrillation: Secondary | ICD-10-CM | POA: Diagnosis present

## 2017-06-22 DIAGNOSIS — I120 Hypertensive chronic kidney disease with stage 5 chronic kidney disease or end stage renal disease: Secondary | ICD-10-CM | POA: Diagnosis present

## 2017-06-22 DIAGNOSIS — I429 Cardiomyopathy, unspecified: Secondary | ICD-10-CM | POA: Diagnosis present

## 2017-06-22 DIAGNOSIS — M79674 Pain in right toe(s): Secondary | ICD-10-CM | POA: Diagnosis present

## 2017-06-22 DIAGNOSIS — E875 Hyperkalemia: Secondary | ICD-10-CM | POA: Diagnosis present

## 2017-06-22 DIAGNOSIS — Z8673 Personal history of transient ischemic attack (TIA), and cerebral infarction without residual deficits: Secondary | ICD-10-CM | POA: Diagnosis not present

## 2017-06-22 DIAGNOSIS — Z905 Acquired absence of kidney: Secondary | ICD-10-CM | POA: Diagnosis not present

## 2017-06-22 LAB — PROTIME-INR
INR: 1.88
Prothrombin Time: 21.5 seconds — ABNORMAL HIGH (ref 11.4–15.2)

## 2017-06-22 LAB — CBC
HEMATOCRIT: 33.6 % — AB (ref 36.0–46.0)
Hemoglobin: 10.6 g/dL — ABNORMAL LOW (ref 12.0–15.0)
MCH: 27.2 pg (ref 26.0–34.0)
MCHC: 31.5 g/dL (ref 30.0–36.0)
MCV: 86.4 fL (ref 78.0–100.0)
PLATELETS: 131 10*3/uL — AB (ref 150–400)
RBC: 3.89 MIL/uL (ref 3.87–5.11)
RDW: 16.3 % — AB (ref 11.5–15.5)
WBC: 3.8 10*3/uL — ABNORMAL LOW (ref 4.0–10.5)

## 2017-06-22 LAB — RENAL FUNCTION PANEL
Albumin: 2.4 g/dL — ABNORMAL LOW (ref 3.5–5.0)
Anion gap: 10 (ref 5–15)
BUN: 26 mg/dL — AB (ref 6–20)
CHLORIDE: 104 mmol/L (ref 101–111)
CO2: 23 mmol/L (ref 22–32)
Calcium: 7.2 mg/dL — ABNORMAL LOW (ref 8.9–10.3)
Creatinine, Ser: 4.31 mg/dL — ABNORMAL HIGH (ref 0.44–1.00)
GFR calc Af Amer: 11 mL/min — ABNORMAL LOW (ref >60)
GFR calc non Af Amer: 9 mL/min — ABNORMAL LOW (ref >60)
GLUCOSE: 99 mg/dL (ref 65–99)
POTASSIUM: 3.7 mmol/L (ref 3.5–5.1)
Phosphorus: 3.9 mg/dL (ref 2.5–4.6)
Sodium: 137 mmol/L (ref 135–145)

## 2017-06-22 MED ORDER — CAMPHOR-MENTHOL 0.5-0.5 % EX LOTN
TOPICAL_LOTION | CUTANEOUS | Status: DC | PRN
  Filled 2017-06-22: qty 222

## 2017-06-22 MED ORDER — WARFARIN SODIUM 7.5 MG PO TABS
7.5000 mg | ORAL_TABLET | Freq: Once | ORAL | Status: AC
  Administered 2017-06-22: 7.5 mg via ORAL
  Filled 2017-06-22: qty 1

## 2017-06-22 MED ORDER — LIDOCAINE-PRILOCAINE 2.5-2.5 % EX CREA: 1.0000 "application " | TOPICAL_CREAM | CUTANEOUS | Status: DC | PRN

## 2017-06-22 MED ORDER — PENTAFLUOROPROP-TETRAFLUOROETH EX AERO: 1.0000 "application " | INHALATION_SPRAY | CUTANEOUS | Status: DC | PRN

## 2017-06-22 MED ORDER — CHLORHEXIDINE GLUCONATE CLOTH 2 % EX PADS: 6.0000 | MEDICATED_PAD | Freq: Every day | CUTANEOUS | Status: DC

## 2017-06-22 MED ORDER — CALCITRIOL 0.25 MCG PO CAPS
ORAL_CAPSULE | ORAL | Status: AC
  Filled 2017-06-22: qty 1

## 2017-06-22 MED ORDER — LIDOCAINE HCL (PF) 1 % IJ SOLN: 5.0000 mL | INTRAMUSCULAR | Status: DC | PRN

## 2017-06-22 MED ORDER — NEPRO/CARBSTEADY PO LIQD
237.0000 mL | Freq: Two times a day (BID) | ORAL | Status: DC
  Administered 2017-06-22 – 2017-06-23 (×2): 237 mL via ORAL
  Filled 2017-06-22 (×6): qty 237

## 2017-06-22 MED ORDER — SODIUM CHLORIDE 0.9 % IV SOLN: 100.0000 mL | INTRAVENOUS | Status: DC | PRN

## 2017-06-22 MED ORDER — CALCITRIOL 0.25 MCG PO CAPS
0.2500 ug | ORAL_CAPSULE | ORAL | Status: DC
  Administered 2017-06-22: 0.25 ug via ORAL

## 2017-06-22 NOTE — Clinical Social Work Note (Addendum)
Clinical Social Work Assessment **Discharge Plan is Home with [facility] services. CSW signing off. **Nurse Case Manager contacted regarding [facility] services   Bradley Details  Name: [name] MRN: [id number] Date of Birth: 21-Jan-1945  Date of referral:  06/22/17               Reason for consult:  Facility Placement, Discharge Planning                Permission sought to share information with:  Other(Bradley discharging home, did not request permission to speak with family) Permission granted to share information::     Name::        Agency::     Relationship::     Contact Information:     Housing/Transportation Living arrangements for the past 2 months:  Apartment([address]) Source of Information:  Bradley Bradley Interpreter Needed:  None Criminal Activity/Legal Involvement Pertinent to Current Situation/Hospitalization:  No - Comment as needed Significant Relationships:  Other Family Members, Adult Children, Neighbor Lives with:  Self Do you feel safe going back to the place where you live?  Yes Need for family participation in Bradley care:  No (Coment)  Care giving concerns: Bradley reported that she lives alone with her 2 dogs, but has neighbors that will check on her. Bradley ideally would like an aide to come in and assist her, but understands that she is not eligible get one. Ms. Drucilla Schmidt agreeable to [facility] services, but does not want to use [facility].  Social Worker assessment / plan:  CSW talked with Bradley in her room regarding her discharge disposition and the recommendation by PT of SNF or home with [facility] services. Ms. Delaney was sitting up in a chair drinking coffee and engaged easily with CSW in conversation. Bradley reported that she has 2 Sarah who live in [place], [place] and Dennie Fetters and reported that they call her daily and come by to check on her frequently. Bradley added that she has 4 Sarah and 1 Sarah. Bradley shared that she  graduated from [employer] in 1969 with a degree in nursing. Bradley shared that she and daughter [name] have been looking for a house to purchase together.  Short term rehab discussed and Bradley advised CSW that she has been to SNF before for rehab, does not want to return and feels that she will be fine at home. When asked, Ms. Skalsky responded that she got from the bed to the chair independently this morning. Bradley reported that she has durable medical but does not use it and this was discussed. CSW also talked with Bradley about a medical alert because she lives alone, and this was discussed.   Employment status:  Retired Health visitor, Engineer, agricultural Supplement) PT Recommendations:  [place], [place] / Referral to community resources:  Other (Comment Required)(None needed or requested from [place])  Bradley/Family's Response to care: Bradley expressed no concerns regarding her care during hospitalization.  Bradley/Family's Understanding of and Emotional Response to Diagnosis, Current Treatment, and Prognosis:  Bradley talked very intelligently with the renal staff person that came in to check her during CSW's visit. Bradley expressed understanding that she is weak from being in the hospital and indicated that she will take it easy at home.  Emotional Assessment Appearance:  Appears younger than stated age Attitude/Demeanor/Rapport:  Engaged Affect (typically observed):  Accepting, Pleasant, Appropriate Orientation:  Oriented to Self, Oriented to Place, Oriented to  Time, Oriented to Situation  Alcohol / Substance use:  Tobacco Use, Alcohol Use, Illicit Drugs(Bradley reported that she quit smoking, drinks alcohol and does not use illict drugs) Psych involvement (Current and /or in the community):  No (Comment)  Discharge Needs  Concerns to be addressed:  Discharge Planning Concerns Readmission within the last 30 days:  No Current  discharge risk:  None Barriers to Discharge:  Continued Medical Work up   [employer] Mila Homer, LCSW 06/22/2017, 10:39 AM

## 2017-06-22 NOTE — Progress Notes (Signed)
Physical Therapy Treatment Patient Details Name: [name] MRN: [id number] DOB: 10-19-1945 Today's Date: 06/22/2017    History of Present Illness Patient is a 72 y.o. F with history of atrial fibrillation, pulmonary hypertension, CAD, cardiomyopathy, hypothyroidism, ESRD on hemodialysis was brought to ER after fall at home.     PT Comments    Pt admitted with above diagnosis. Pt has decreased caregiver support and lives alone. On PT evaluation, patient performing all functional mobility at a min assist level; ambulating 80 feet with RW. Had overt loss of balance when attempted dynamic balance without an external device. Of note, patient also has right 2nd and 3rd toe pain which increases with weightbearing (Xray ordered) and low BP (see below), but orthostatics stable. Presents as a high fall risk secondary to repeated falls and decreased gait speed. Also per chart review, PT on last admission educated extensively on using Rollator for all mobility, however, patient states she does not use it at home. Therefore, recommending SNF to maximize functional independence. Could also consider [place], but not sure if patient will qualify due to decreased family support. Pt will benefit from skilled PT to increase their independence and safety with mobility.  Orthostatics:  Supine - 75/54 Sitting: 81/64 Standing: 94/59    Follow Up Recommendations  SNF;Supervision for mobility/OOB (could consider [facility] if pt has sufficient family support)     Equipment Recommendations  None recommended by PT    Recommendations for Other Services       Precautions / Restrictions Precautions Precautions: Fall Precaution Comments: low BP Restrictions Weight Bearing Restrictions: No    Mobility  Bed Mobility Overal bed mobility: Needs Assistance Bed Mobility: Supine to Sit     Supine to sit: Min assist     General bed mobility comments: light min hand held assist to pull up  to sit  Transfers Overall transfer level: Needs assistance Equipment used: None;Rolling walker (2 wheeled) Transfers: Sit to/from Stand Sit to Stand: Min assist         General transfer comment: initially trialed transitioning from sit to stand without device; patient able to statically stand, but once she began marching in place, had loss of balance and had to sit back on the bed. therefore, utilized RW for next transfer  Ambulation/Gait Ambulation/Gait assistance: Min Web designer (Feet): 80 Feet Assistive device: Rolling walker (2 wheeled) Gait Pattern/deviations: Step-through pattern;Shuffle;Decreased stance time - left;Wide base of support;Decreased stride length   Gait velocity interpretation: <1.31 ft/sec, indicative of household ambulator General Gait Details: Slow trendelenberg gait with decreased bilateral foot clearance. Cueing for RW proximity. HR 113 following ambulation.   Stairs             Wheelchair Mobility    Modified Rankin (Stroke Patients Only)       Balance Overall balance assessment: Needs assistance Sitting-balance support: Bilateral upper extremity supported;Feet supported Sitting balance-Leahy Scale: Fair     Standing balance support: No upper extremity supported;During functional activity Standing balance-Leahy Scale: Poor Standing balance comment: needs external support                            Cognition Arousal/Alertness: Awake/alert Behavior During Therapy: WFL for tasks assessed/performed Overall Cognitive Status: Within Functional Limits for tasks assessed  Exercises      General Comments        Pertinent Vitals/Pain Pain Assessment: Faces Faces Pain Scale: Hurts little more Pain Location: Pain in 2nd and 3rd right toes following fall - xray ordered Pain Descriptors / Indicators: Discomfort Pain Intervention(s): Monitored during session     Home Living Family/patient expects to be discharged to:: Private residence Living Arrangements: Alone Available Help at Discharge: Family;Friend(s);Available PRN/intermittently Type of Home: Apartment Home Access: Level entry   Home Layout: One level Home Equipment: Walker - 2 wheels;Cane - single point;Shower seat      Prior Function Level of Independence: Independent with assistive device(s)      Comments: pt ambulates with use of SPC PRN   PT Goals (current goals can now be found in the care plan section) Acute Rehab PT Goals Patient Stated Goal: have more help at home PT Goal Formulation: With patient Time For Goal Achievement: 07/06/17 Potential to Achieve Goals: Fair    Frequency    Min 3X/week      PT Plan      Co-evaluation              AM-PAC PT "6 Clicks" Daily Activity  Outcome Measure  Difficulty turning over in bed (including adjusting bedclothes, sheets and blankets)?: None Difficulty moving from lying on back to sitting on the side of the bed? : Unable Difficulty sitting down on and standing up from a chair with arms (e.g., wheelchair, bedside commode, etc,.)?: Unable Help needed moving to and from a bed to chair (including a wheelchair)?: A Little Help needed walking in hospital room?: A Little Help needed climbing 3-5 steps with a railing? : A Lot 6 Click Score: 14    End of Session Equipment Utilized During Treatment: Gait belt Activity Tolerance: Patient tolerated treatment well Patient left: in chair;with call bell/phone within reach;with chair alarm set Nurse Communication: Mobility status PT Visit Diagnosis: Unsteadiness on feet (R26.81);History of falling (Z91.81);Difficulty in walking, not elsewhere classified (R26.2)     Time: 0233-4356 PT Time Calculation (min) (ACUTE ONLY): 25 min  Charges:  $Gait Training: 8-22 mins                    G Codes:       [name], PT, DPT Acute Rehabilitation Services  Pager:  [phone]    [name] 06/22/2017, 8:53 AM

## 2017-06-22 NOTE — Progress Notes (Signed)
ANTICOAGULATION CONSULT NOTE  Pharmacy Consult for Warfarin  Indication: atrial fibrillation  Patient Measurements: Height: 5\' 2"  (157.5 cm) Weight: 159 lb 6.3 oz (72.3 kg) IBW/kg (Calculated) : 50.1  Vital Signs: Temp: 97.4 F (36.3 C) (06/21 1420) Temp Source: Oral (06/21 1420) BP: 81/53 (06/21 1420) Pulse Rate: 67 (06/21 1420)  Labs: Recent Labs    06/20/17 2058 06/21/17 0523  HGB 11.0* 10.8*  HCT 34.9* 33.8*  PLT 140* 108*  LABPROT  --  16.5*  INR  --  1.35  CREATININE 5.64* 5.99*  TROPONINI  --  0.04*    Estimated Creatinine Clearance: 7.8 mL/min (A) (by C-G formula based on SCr of 5.99 mg/dL (H)).  Assessment: 72 y/o F here with fall/weakness/confusion, CT head negative, on warfarin PTA for afib- home dose if 7.5mg  daily with last dose on 6/19. Admit INR low at 1.35, repeat INR has not yet been drawn today- will likely be done when patient goes to HD, but if it done after patient starts, it will be inaccurate. No bleeding noted.  Goal of Therapy:  INR 2-3 Monitor platelets by anticoagulation protocol: Yes   Plan:  Warfarin 7.5mg  po x1 tonight as per home dose Daily INR Follow for s/s bleeding  Kaye Luoma D. Kato Wieczorek, PharmD, BCPS Clinical Pharmacist [phone] Please check AMION for all [place] numbers 06/22/2017 3:34 PM

## 2017-06-22 NOTE — Care Management Obs Status (Signed)
[place] NOTIFICATION   Patient Details  Name: [name] MRN: [id number] Date of Birth: 09-24-1945   Medicare Observation Status Notification Given:       Marilu Favre, RN 06/22/2017, 10:49 AM

## 2017-06-22 NOTE — Progress Notes (Addendum)
Patient ID: [name], female   DOB: 03/15/1945, 72 y.o.   MRN: [id number]                                                                PROGRESS NOTE                                                                                                                                                                                                             Patient Demographics:    [name], is a 72 y.o. female, DOB - December 11, 1945, IOE:[id number]  Admit date - 06/20/2017   Admitting Physician Rise Patience, MD  Outpatient Primary MD for the patient is Glendale Chard, MD  LOS - 0  Outpatient Specialists:     Chief Complaint  Patient presents with  . Fall       Brief Narrative     72 y.o. female with history of atrial fibrillation, pulmonary hypertension, CAD, cardiomyopathy, hypothyroidism, ESRD on hemodialysis was brought to the ER after patient had a fall at home.  As per the daughter patient was trying to reach something over her bed after she reached home following dialysis.  Letter she was found on the floor.  It was not exactly known how she restart to the floor.  When the patient's daughter checked on the patient while she was on the floor she appeared confused and was initially not following commands well.  Patient did not have any incontinence of urine or tongue bite.  Did not complain of any chest pain or shortness of breath.  After around 10 minutes patient was back to baseline.  Patient states he recalls all the incident.  ED Course: In the ER patient was mildly hypotensive initially but improved slowly.  Tachycardic.  CT head was unremarkable.  Patient is afebrile.  On exam patient has significant lower extremity edema which is concerning for the family also since this happened in the last 2 days.  Patient denies chest pain or shortness of breath.  Denies any nausea vomiting or abdominal pain.     Subjective:    [name] today has been afebrile overnite. Edema  in her legs has improved.  Pt bp low, awaiting dialysis today.    No headache, No chest pain, No abdominal pain - No  Nausea, No new weakness tingling or numbness, No Cough - SOB.    Assessment  & Plan :    Principal Problem:   Fluid overload Active Problems:   ESRD   HTN (hypertension)   Epilepsy (HCC)   Pulmonary hypertension (HCC)   Unspecified atrial fibrillation (HCC)  Fluid overloard/ Edema Improved with extra HD yesterday. HD today  ESRD on HD M, W, F Appreciate nephrology input  Hypotension Will observe today, if doing well anticipate discharge in AM  Toe pain right foot 2nd and 3rd toe, hx of bumping toe prior to admission Xray today  Pafib Cont coumadin pharmacy t odose Cont digoxin Cont cardizem Cont metoprolol  Seizure do Cont keppra  Pulmonary hypertension Cont Letairis  CAD, Cardiomyopathy (EF 40-45%) Stable  Hypothyroidism Cont synthroid  Anemia Check cbc, cmp in am  DVT prophylaxis: Coumadin. Code Status: DNR. Family Communication:  w patient Disposition Plan: Home tomorrow if bp stable Consults called: None. Admission status: Observation.     Lab Results  Component Value Date   PLT 108 (L) 06/21/2017    Antibiotics  :  none  Anti-infectives (From admission, onward)   None        Objective:   Vitals:   06/21/17 1512 06/21/17 1627 06/21/17 2046 06/22/17 0516  BP: (!) 103/52 (!) 86/58 107/62 (!) 72/52  Pulse: (!) 101 73 (!) 126 68  Resp: 19  16 16   Temp: 97.7 F (36.5 C) 98.2 F (36.8 C) 98.1 F (36.7 C) 98.4 F (36.9 C)  TempSrc: Oral Oral Oral   SpO2: 99% 94% 96% (!) 89%  Weight: 72.3 kg (159 lb 6.3 oz)  72.3 kg (159 lb 6.3 oz)   Height:        Wt Readings from Last 3 Encounters:  06/21/17 72.3 kg (159 lb 6.3 oz)  05/21/17 74 kg (163 lb 2.3 oz)  05/16/17 73 kg (161 lb)     Intake/Output Summary (Last 24 hours) at 06/22/2017 0759 Last data filed at 06/22/2017 8119 Gross per 24 hour  Intake 180 ml  Output  4000 ml  Net -3820 ml     Physical Exam  Awake Alert, Oriented X 3, No new F.N deficits, Normal affect [place].AT,PERRAL Supple Neck,No JVD, No cervical lymphadenopathy appriciated.  Symmetrical Chest wall movement, Good air movement bilaterally, CTAB RRR,No Gallops,Rubs or new Murmurs, No Parasternal Heave +ve B.Sounds, Abd Soft, No tenderness, No organomegaly appriciated, No rebound - guarding or rigidity. No Cyanosis, Clubbing or edema, No new Rash or bruise   Lavf Right foot toes no bruise, no redness, 3rd toe slight tenderness with palpation  Gait instability    Data Review:    CBC Recent Labs  Lab 06/20/17 2058 06/21/17 0523  WBC 3.7* 3.0*  HGB 11.0* 10.8*  HCT 34.9* 33.8*  PLT 140* 108*  MCV 86.4 86.0  MCH 27.2 27.5  MCHC 31.5 32.0  RDW 16.5* 16.3*  LYMPHSABS 0.8  --   MONOABS 0.6  --   EOSABS 0.1  --   BASOSABS 0.1  --     Chemistries  Recent Labs  Lab 06/20/17 2058 06/21/17 0523  NA 136 139  K 5.7* 5.6*  CL 94* 98*  CO2 29 24  GLUCOSE 80 64*  BUN 24* 28*  CREATININE 5.64* 5.99*  CALCIUM 8.8* 8.3*  MG  --  1.7  AST 31  --   ALT 20  --   ALKPHOS 79  --   BILITOT 0.8  --    ------------------------------------------------------------------------------------------------------------------  No results for input(s): CHOL, HDL, LDLCALC, TRIG, CHOLHDL, LDLDIRECT in the last 72 hours.  No results found for: HGBA1C ------------------------------------------------------------------------------------------------------------------ Recent Labs    06/21/17 0523  TSH 6.576*   ------------------------------------------------------------------------------------------------------------------ No results for input(s): VITAMINB12, FOLATE, FERRITIN, TIBC, IRON, RETICCTPCT in the last 72 hours.  Coagulation profile Recent Labs  Lab 06/21/17 0523  INR 1.35    No results for input(s): DDIMER in the last 72 hours.  Cardiac Enzymes Recent Labs  Lab  06/21/17 0523  TROPONINI 0.04*   ------------------------------------------------------------------------------------------------------------------    Component Value Date/Time   BNP 600.3 (H) 04/14/2017 1533    Inpatient Medications  Scheduled Meds: . ambrisentan  5 mg Oral Daily  . aspirin EC  81 mg Oral Daily  . cilostazol  100 mg Oral BID AC  . cinacalcet  90 mg Oral Q M,W,F-HD  . digoxin  0.125 mg Oral Once per day on Mon Wed Fri  . diltiazem  60 mg Oral TID  . levETIRAcetam  500 mg Oral Q2000  . levothyroxine  150 mcg Oral QAC breakfast  . metoprolol tartrate  75 mg Oral BID  . multivitamin  1 tablet Oral QHS  . pantoprazole  40 mg Oral Daily  . patiromer  8.4 g Oral Daily  . pravastatin  40 mg Oral QHS  . ranolazine  500 mg Oral BID  . sevelamer carbonate  1,600 mg Oral TID WC  . Warfarin - Pharmacist Dosing Inpatient   Does not apply q1800   Continuous Infusions: PRN Meds:.acetaminophen **OR** acetaminophen, nitroGLYCERIN, ondansetron **OR** ondansetron (ZOFRAN) IV, polyethylene glycol, senna-docusate, sevelamer carbonate, traMADol, traZODone  Micro Results Recent Results (from the past 240 hour(s))  MRSA PCR Screening     Status: None   Collection Time: 06/21/17  1:18 AM  Result Value Ref Range Status   MRSA by PCR NEGATIVE NEGATIVE Final    Comment:        The GeneXpert MRSA Assay (FDA approved for NASAL specimens only), is one component of a comprehensive MRSA colonization surveillance program. It is not intended to diagnose MRSA infection nor to guide or monitor treatment for MRSA infections. Performed at [facility], [place] [address], [place], [place] [address]     Radiology Reports Dg Chest 2 View  Result Date: 06/20/2017 CLINICAL DATA:  Weakness EXAM: CHEST - 2 VIEW COMPARISON:  04/20/2017 FINDINGS: Shallow inspiration. Cardiac enlargement. Pulmonary vascularity is normal for technique. No definite infiltration or edema. No blunting of  costophrenic angles. No pneumothorax. Calcification of the aorta. Degenerative changes in the spine and shoulders. IMPRESSION: Cardiac enlargement. No evidence of active pulmonary disease. Aortic atherosclerosis. Electronically Signed   By: Lucienne Capers M.D.   On: 06/20/2017 21:29   Ct Head Wo Contrast  Result Date: 06/20/2017 CLINICAL DATA:  Patient fell attempting to get out of bed. EXAM: CT HEAD WITHOUT CONTRAST TECHNIQUE: Contiguous axial images were obtained from the base of the skull through the vertex without intravenous contrast. COMPARISON:  08/24/2015 FINDINGS: Brain: Remote infarcts in the left frontal, parietal, bilateral cerebellar and left occipital lobes as before. No acute intracranial hemorrhage, intra-axial mass nor extra-axial fluid collections. No hydrocephalus. Chronic microvascular ischemic disease. Vascular: No hyperdense vessel sign. Atherosclerosis of the cavernous internal carotids. Skull: No displaced calvarial fracture. Sinuses/Orbits: No acute finding. Other: No significant scalp swelling. IMPRESSION: 1. Chronic stable microvascular ischemic disease and chronic infarcts as above described. 2. No acute intracranial abnormality.  No skull fracture noted. Electronically Signed   By:  Ashley Royalty M.D.   On: 06/20/2017 20:57    Time Spent in minutes  30   Jani Gravel M.D on 06/22/2017 at 7:59 AM  Between 7am to 7pm - Pager - [phone]    After 7pm go to [email or web address] - password [facility]  [facility] -  Office  [phone]

## 2017-06-22 NOTE — Care Management Note (Signed)
Case Management Note  Patient Details  Name: [name] MRN: [id number] Date of Birth: [birth year]  Subjective/Objective:                    Action/Plan:   Expected Discharge Date:  06/25/17               Expected Discharge Plan:  [place]  In-House Referral:     Discharge planning Services  CM Consult  Post Acute Care Choice:  Home Health Choice offered to:  Patient  DME Arranged:  N/A DME Agency:  NA  HH Arranged:  RN, PT HH Agency:  [facility]  Status of Service:  Completed, signed off  If discussed at [place] of Stay Meetings, dates discussed:    Additional Comments:  Marilu Favre, RN 06/22/2017, 10:51 AM

## 2017-06-22 NOTE — Progress Notes (Signed)
[facility] Progress Note   Subjective: Up in chair, no C/Os. Says she feels better today. No C/O SOB. Mental status appear at baseline.  Objective Vitals:   06/21/17 1627 06/21/17 2046 06/22/17 0516 06/22/17 0917  BP: (!) 86/58 107/62 (!) 72/52 (!) 103/57  Pulse: 73 (!) 126 68 84  Resp:  16 16 18   Temp: 98.2 F (36.8 C) 98.1 F (36.7 C) 98.4 F (36.9 C) 97.7 F (36.5 C)  TempSrc: Oral Oral  Oral  SpO2: 94% 96% (!) 89%   Weight:  72.3 kg (159 lb 6.3 oz)    Height:       Physical Exam General: chronically ill appearing elderly female in NAD Heart:S1,S2 regularly irreg.  Lungs: Few bibasilar crackles. No WOB Abdomen: soft, NT Extremities: BLE trace-1+pitting edema Dialysis Access: RUA AVG-concerned D/T very faint bruit. May need VVS consult if AVG does not work in HD today.   Additional Objective Labs: Basic Metabolic Panel: Recent Labs  Lab 06/20/17 2058 06/21/17 0523  NA 136 139  K 5.7* 5.6*  CL 94* 98*  CO2 29 24  GLUCOSE 80 64*  BUN 24* 28*  CREATININE 5.64* 5.99*  CALCIUM 8.8* 8.3*   Liver Function Tests: Recent Labs  Lab 06/20/17 2058  AST 31  ALT 20  ALKPHOS 79  BILITOT 0.8  PROT 6.7  ALBUMIN 3.0*   No results for input(s): LIPASE, AMYLASE in the last 168 hours. CBC: Recent Labs  Lab 06/20/17 2058 06/21/17 0523  WBC 3.7* 3.0*  NEUTROABS 2.2  --   HGB 11.0* 10.8*  HCT 34.9* 33.8*  MCV 86.4 86.0  PLT 140* 108*   Blood Culture    Component Value Date/Time   SDES BLOOD RIGHT HAND 04/16/2017 1021   SPECREQUEST  04/16/2017 1021    BOTTLES DRAWN AEROBIC AND ANAEROBIC Blood Culture adequate volume   CULT  04/16/2017 1021    NO GROWTH 5 DAYS Performed at [facility], [place] [address], [place], [place] [address]    REPTSTATUS 04/21/2017 FINAL 04/16/2017 1021    Cardiac Enzymes: Recent Labs  Lab 06/21/17 0523  TROPONINI 0.04*   CBG: No results for input(s): GLUCAP in the last 168 hours. Iron Studies: No results  for input(s): IRON, TIBC, TRANSFERRIN, FERRITIN in the last 72 hours. @lablastinr3 @ Studies/Results: Dg Chest 2 View  Result Date: 06/20/2017 CLINICAL DATA:  Weakness EXAM: CHEST - 2 VIEW COMPARISON:  04/20/2017 FINDINGS: Shallow inspiration. Cardiac enlargement. Pulmonary vascularity is normal for technique. No definite infiltration or edema. No blunting of costophrenic angles. No pneumothorax. Calcification of the aorta. Degenerative changes in the spine and shoulders. IMPRESSION: Cardiac enlargement. No evidence of active pulmonary disease. Aortic atherosclerosis. Electronically Signed   By: Lucienne Capers M.D.   On: 06/20/2017 21:29   Ct Head Wo Contrast  Result Date: 06/20/2017 CLINICAL DATA:  Patient fell attempting to get out of bed. EXAM: CT HEAD WITHOUT CONTRAST TECHNIQUE: Contiguous axial images were obtained from the base of the skull through the vertex without intravenous contrast. COMPARISON:  08/24/2015 FINDINGS: Brain: Remote infarcts in the left frontal, parietal, bilateral cerebellar and left occipital lobes as before. No acute intracranial hemorrhage, intra-axial mass nor extra-axial fluid collections. No hydrocephalus. Chronic microvascular ischemic disease. Vascular: No hyperdense vessel sign. Atherosclerosis of the cavernous internal carotids. Skull: No displaced calvarial fracture. Sinuses/Orbits: No acute finding. Other: No significant scalp swelling. IMPRESSION: 1. Chronic stable microvascular ischemic disease and chronic infarcts as above described. 2. No acute intracranial abnormality.  No skull fracture noted. Electronically Signed   By: Ashley Royalty M.D.   On: 06/20/2017 20:57   Medications:  . ambrisentan  5 mg Oral Daily  . aspirin EC  81 mg Oral Daily  . cilostazol  100 mg Oral BID AC  . cinacalcet  90 mg Oral Q M,W,F-HD  . digoxin  0.125 mg Oral Once per day on Mon Wed Fri  . diltiazem  60 mg Oral TID  . levETIRAcetam  500 mg Oral Q2000  . levothyroxine  150 mcg  Oral QAC breakfast  . metoprolol tartrate  75 mg Oral BID  . multivitamin  1 tablet Oral QHS  . pantoprazole  40 mg Oral Daily  . patiromer  8.4 g Oral Daily  . pravastatin  40 mg Oral QHS  . ranolazine  500 mg Oral BID  . sevelamer carbonate  1,600 mg Oral TID WC  . Warfarin - Pharmacist Dosing Inpatient   Does not apply q1800    HD Orders: [facility] MWF 4 hrs 180NRe 450/800  72 kg 2.0 K/2.0 Ca UFP 4  - Calcitriol 2.61mcg PO q HD - Sensipar 120mg  PO q HD - Venofer 50mg  IV weekly, No ESA for now - Binder: Renvela 4/meals. LUA HERO AVG  Assessment/Plan: 1. Volume overload: H/O shortened HD treatments. HD off schedule yesterday and again today to get back on schedule.  2. ESRD -MWF [facility]. HD as noted above.  3. Anemia - HGB 10.8 No ESA needed.  4. Secondary hyperparathyroidism - Ca 8.8 Check renal function panel with HD today. Cont binders, senispar, VDRA.  5. Hypotension/volume -BP remains soft-may confound HD today. On Dilt. HD yesterday Pre wt 76.3 kg Net UF 4 liters. Post wt 72.3 kg. Close to OP EDW however still with BLE pitting edema, few crackles. Lower EDW while in hospital if possible with low BP. May need midodrine.   6. Nutrition -Albumin 3.0 Renal Diet/nepro, renal vit.   7. H/O Seizure disorder-on Keppra-per primary 8 Hypothyroidism-per primary 9 Paroxysmal AFIB-On coumadin followed by pharmacy. Last INR 1.35 10. Mechanical fall at home- 11 H/O Cardiomyopathy 12 H/O Pulmonary HTN. On Letairis. Per primary 13. H/O PAD  Analys Ryden H. Johanthan Kneeland NP-C 06/22/2017, 9:38 AM  [employer] [phone]

## 2017-06-22 NOTE — Care Management Note (Signed)
Case Management Note  Patient Details  Name: NAVAYA WIATREK MRN: [id number] Date of Birth: 11/19/1945  Subjective/Objective:                    Action/Plan:  PT recommendation is for SNF. Explained to patient . Patient states understanding but declining SNF. She lives alone but states  Her children can help her.   She already has walker , 3 in 1 and shower chair at home. She does not want [facility] for home health. Choice offered , she wants [facility]. Text page Dr Maudie Mercury asking for home health orders. Patient aware. Expected Discharge Date:  06/25/17               Expected Discharge Plan:  [place]  In-House Referral:     Discharge planning Services  CM Consult  Post Acute Care Choice:  Home Health Choice offered to:  Patient  DME Arranged:  N/A DME Agency:  NA  HH Arranged:    [place] Agency:  [facility]  Status of Service:  In process, will continue to follow  If discussed at Long Length of Stay Meetings, dates discussed:    Additional Comments:  Marilu Favre, RN 06/22/2017, 10:36 AM

## 2017-06-23 DIAGNOSIS — I4891 Unspecified atrial fibrillation: Secondary | ICD-10-CM

## 2017-06-23 LAB — CBC
HCT: 36.4 % (ref 36.0–46.0)
Hemoglobin: 11.3 g/dL — ABNORMAL LOW (ref 12.0–15.0)
MCH: 27.1 pg (ref 26.0–34.0)
MCHC: 31 g/dL (ref 30.0–36.0)
MCV: 87.3 fL (ref 78.0–100.0)
PLATELETS: 97 10*3/uL — AB (ref 150–400)
RBC: 4.17 MIL/uL (ref 3.87–5.11)
RDW: 16.3 % — ABNORMAL HIGH (ref 11.5–15.5)
WBC: 5.3 10*3/uL (ref 4.0–10.5)

## 2017-06-23 LAB — PROTIME-INR
INR: 1.81
PROTHROMBIN TIME: 20.8 s — AB (ref 11.4–15.2)

## 2017-06-23 MED ORDER — NEPRO/CARBSTEADY PO LIQD: 237.0000 mL | Freq: Two times a day (BID) | ORAL | 0 refills | Status: DC

## 2017-06-23 MED ORDER — PATIROMER SORBITEX CALCIUM 8.4 G PO PACK: 8.4000 g | PACK | Freq: Every day | ORAL | 0 refills | Status: DC

## 2017-06-23 MED ORDER — CALCITRIOL 0.25 MCG PO CAPS: 0.2500 ug | ORAL_CAPSULE | ORAL | 0 refills | Status: DC

## 2017-06-23 NOTE — Progress Notes (Signed)
ANTICOAGULATION CONSULT NOTE  Pharmacy Consult for Warfarin  Indication: atrial fibrillation  Patient Measurements: Height: 5\' 2"  (157.5 cm) Weight: 160 lb 15 oz (73 kg) IBW/kg (Calculated) : 50.1  Vital Signs: Temp: 97.4 F (36.3 C) (06/22 0437) Temp Source: Oral (06/22 0437) BP: 93/79 (06/22 0437) Pulse Rate: 106 (06/22 0437)  Labs: Recent Labs    06/20/17 2058 06/21/17 0523 06/22/17 1514 06/22/17 1535 06/23/17 0532  HGB 11.0* 10.8*  --  10.6*  --   HCT 34.9* 33.8*  --  33.6*  --   PLT 140* 108*  --  131*  --   LABPROT  --  16.5*  --  21.5* 20.8*  INR  --  1.35  --  1.88 1.81  CREATININE 5.64* 5.99* 4.31*  --   --   TROPONINI  --  0.04*  --   --   --     Estimated Creatinine Clearance: 10.9 mL/min (A) (by C-G formula based on SCr of 4.31 mg/dL (H)).  Assessment: 72 y/o F here with fall/weakness/confusion, CT head negative, on warfarin PTA for afib- home dose if 7.5mg  daily with last dose on 6/19. INR low on admission and INR this am close to goal range at 1.81. Appears patient being discharged.   Goal of Therapy:  INR 2-3 Monitor platelets by anticoagulation protocol: Yes   Plan:  Patient to take dose of warfarin at home upon discharge Nursing to call pharmacy back if patient will need a dose of warfarin before discharge and orders will be entered.   Jalene Mullet, Pharm.D. PGY1 Pharmacy Resident 06/23/2017 9:14 AM Please check AMION for all [place] numbers

## 2017-06-23 NOTE — Progress Notes (Addendum)
[facility] Progress Note   Subjective:  Seen in room. No new complaints, LE edema improving. No SOB/CP. Seems like will be discharged today. Stable from renal perspective   Objective Vitals:   06/22/17 1800 06/22/17 1820 06/22/17 2051 06/23/17 0437  BP: (!) 118/56 (!) 120/43 (!) 82/56 93/79  Pulse: 85 96 88 (!) 106  Resp:  18 16 20   Temp:  98.4 F (36.9 C) 98.7 F (37.1 C) (!) 97.4 F (36.3 C)  TempSrc:  Oral Oral Oral  SpO2:  96% 97% 100%  Weight:  73 kg (160 lb 15 oz)    Height:       Physical Exam General: Well appearing, NAD Heart: RRR; no murmur Lungs: CTAB Extremities: Trace LE edema Dialysis Access: LUE AVG + bruit  Additional Objective Labs: Basic Metabolic Panel: Recent Labs  Lab 06/20/17 2058 06/21/17 0523 06/22/17 1514  NA 136 139 137  K 5.7* 5.6* 3.7  CL 94* 98* 104  CO2 29 24 23   GLUCOSE 80 64* 99  BUN 24* 28* 26*  CREATININE 5.64* 5.99* 4.31*  CALCIUM 8.8* 8.3* 7.2*  PHOS  --   --  3.9   Liver Function Tests: Recent Labs  Lab 06/20/17 2058 06/22/17 1514  AST 31  --   ALT 20  --   ALKPHOS 79  --   BILITOT 0.8  --   PROT 6.7  --   ALBUMIN 3.0* 2.4*   CBC: Recent Labs  Lab 06/20/17 2058 06/21/17 0523 06/22/17 1535  WBC 3.7* 3.0* 3.8*  NEUTROABS 2.2  --   --   HGB 11.0* 10.8* 10.6*  HCT 34.9* 33.8* 33.6*  MCV 86.4 86.0 86.4  PLT 140* 108* 131*   Studies/Results: Dg Toe 2nd Right  Result Date: 06/22/2017 CLINICAL DATA:  Pain and swelling due to blunt trauma. EXAM: RIGHT SECOND TOE COMPARISON:  None. FINDINGS: There is no evidence of fracture or dislocation. There is no evidence of arthropathy or other focal bone abnormality. Soft tissues are unremarkable. IMPRESSION: Negative. Electronically Signed   By: Lorriane Shire M.D.   On: 06/22/2017 09:46   Dg Toe 3rd Right  Result Date: 06/22/2017 CLINICAL DATA:  Pain and swelling after blunt trauma. EXAM: RIGHT THIRD TOE COMPARISON:  None. FINDINGS: There is no evidence  of fracture or dislocation. There is no evidence of arthropathy or other focal bone abnormality. Soft tissues are unremarkable. IMPRESSION: Negative. Electronically Signed   By: Lorriane Shire M.D.   On: 06/22/2017 09:46   Medications: . sodium chloride    . sodium chloride     . ambrisentan  5 mg Oral Daily  . aspirin EC  81 mg Oral Daily  . calcitRIOL  0.25 mcg Oral Q M,W,F-HD  . Chlorhexidine Gluconate Cloth  6 each Topical Q0600  . cilostazol  100 mg Oral BID AC  . cinacalcet  90 mg Oral Q M,W,F-HD  . digoxin  0.125 mg Oral Once per day on Mon Wed Fri  . diltiazem  60 mg Oral TID  . feeding supplement (NEPRO CARB STEADY)  237 mL Oral BID BM  . levETIRAcetam  500 mg Oral Q2000  . levothyroxine  150 mcg Oral QAC breakfast  . metoprolol tartrate  75 mg Oral BID  . multivitamin  1 tablet Oral QHS  . pantoprazole  40 mg Oral Daily  . patiromer  8.4 g Oral Daily  . pravastatin  40 mg Oral QHS  . ranolazine  500 mg Oral BID  .  sevelamer carbonate  1,600 mg Oral TID WC  . Warfarin - Pharmacist Dosing Inpatient   Does not apply q1800    Dialysis Orders: [facility] MWF 4 hrs 180NRe 450/800 72 kg 2.0 K/2.0 Ca UFP 4 AVG - Calcitriol 2.52mcg PO q HD - Sensipar 120mg  PO q HD - Venofer 50mg  IV weekly, No ESA for now - Binder: Renvela 4/meals.   Assessment/Plan: 1. Volume overload: Hx shortened HD treatments. S/p extra HD here, volume improving. Looks that she is fine for discharge from renal stanbdpoint  2. ESRD -MWF [facility].  3. Anemia - HGB 10.6 No ESA needed.  4. Secondary hyperparathyroidism: Labs ok. 5. Hypotension/volume: Continue to challenge EDW as tolerated.  6. Nutrition -Albumin 2.4 Renal Diet/nepro, renal vit.   7. H/O Seizure disorder-on Keppra-per primary 8 Hypothyroidism-per primary 9 Paroxysmal AFIB-On coumadin followed by pharmacy. Last INR 1.81 10. Mechanical fall at home- 11 H/O Cardiomyopathy 12 H/O Pulmonary HTN. On Letairis. Per primary 13. H/O PAD  I have seen  and examined this patient and agree with the plan of care  [facility] W 06/23/2017, 10:44 AM    Veneta Penton, PA-C 06/23/2017, 9:52 AM  [facility] Pager: [phone]

## 2017-06-23 NOTE — Progress Notes (Signed)
Patient discharged home per MD order. Discharge instructions reviewed with patient. Patient verbalized understanding to follow up with cardiologist and PCP, and that [place] will f/u likely on Monday for PT. Patient to pick up prescriptions as called in to [facility] on [address]. Patient escorted out  Via wheelchair and RN. Bartholomew Crews, RN

## 2017-06-23 NOTE — Discharge Instructions (Signed)

## 2017-06-23 NOTE — Progress Notes (Addendum)
Patient reported bright red blood this morning when having a bowel movement. Stated she noted a small amount of blood on the toilet paper, and then saw some dark red blood in stool. Blood was not witnessed by this RN. Stated that this has not been a problem for a long time. VSS. Hemoglobin 10.6 yesterday. Text message sent to Dr. Maudie Mercury with call back. Stat CBC. Will continue to monitor. Bartholomew Crews, RN

## 2017-06-23 NOTE — Discharge Summary (Signed)
[name], is a 72 y.o. female  DOB 14-Feb-1945  MRN [id number].  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 Due to extra HD session Pt will monitor bp at home.   Toe pain right foot 2nd and 3rd toe, hx of bumping toe prior to admission Xray 6/21=> negative for fracture  Pafib, CAD, PVD Cont coumadin  Cont digoxin Cont cardizem Cont metoprolol Cont Ranexa Cont Pletal  Seizure do Cont keppra  Pulmonary hypertension Cont Letairis  CAD, Cardiomyopathy (EF 40-45%) Stable  Hypothyroidism Cont synthroid  Anemia Check cbc, cmp in 1 week w pcp  Hyperkalemia      Admission Diagnosis  Hyperkalemia [E87.5] Weakness [R53.1] Near syncope [R55] Fluid overload [E87.70]   Discharge Diagnosis  Hyperkalemia [E87.5] Weakness [R53.1] Near syncope [R55] Fluid overload [E87.70]     Principal Problem:   Fluid overload Active Problems:   ESRD   HTN (hypertension)   Epilepsy (HCC)   Pulmonary hypertension (HCC)   Unspecified atrial fibrillation (HCC)      Past Medical History:  Diagnosis Date  . Anemia    History  of Blood transfusion  . Asthma    PMH  . Atrial fibrillation with RVR ([place]) 11/2016  . CAD (coronary artery disease)    stent to RCA  . Cancer ([place])    clear cell cancer, kidney  . Complication of anesthesia 12/2010   pt is very confused, with AMS with anesthesia  . CVA (cerebral infarction) 2003   no apparent residual  .  Dyslipidemia   . Encephalopathy   . ESRD 07/27/2008   ESRD due to HTN and NSAID's, started hemodialysis in 2005 in [place], [place]. Went to [facility] from 2010 to 2012 and since 2012 has been getting dialysis at [facility] on [address] in [place] on a MWF schedule. First access with RUA AVG placed in [place]. Next and current access was LUA AVG placed by Dr. Lucky Cowboy in [place] in or around 2012. Has had 2 or 3 procedures on that graft since placed per family. She gets her access work done here in [place] now. She is allergic to heparin and does not get any heparin at dialysis; she had an allergic reaction apparently when in ICU in the past.      . GERD (gastroesophageal reflux disease)   . Hyperlipidemia   . Hypertension   . Hypothyroidism   . Myocardial infarction ([place])    per pt  . Positive PPD    completed rifampin  . Pulmonary hypertension ([place])   . PVD (  peripheral vascular disease) ([place])   . Seizures ([place])    last seizure 6 years ago  . Sleep apnea    Sleep Study 2008  . Traumatic seroma of left lower leg ([place])   . Wears partial dentures     Past Surgical History:  Procedure Laterality Date  . ABDOMINAL AORTOGRAM W/LOWER EXTREMITY Bilateral 06/15/2016   Procedure: Abdominal Aortogram w/Lower Extremity;  Surgeon: Conrad Sardis, MD;  Location: [facility];  Service: Cardiovascular;  Laterality: Bilateral;  . ABDOMINAL HYSTERECTOMY    . APPENDECTOMY    . APPLICATION OF WOUND VAC Left 09/12/2016   Procedure: APPLICATION OF WOUND VAC;  Surgeon: Conrad Cromberg, MD;  Location: [facility];  Service: Vascular;  Laterality: Left;  . ARTERY EXPLORATION Left 07/25/2016   Procedure: ARTERY EXPLORATION LEFT ABOVE KNEE POPLITEAL AND GROIN;  Surgeon: Conrad Wallace, MD;  Location: [facility];  Service: Vascular;  Laterality: Left;  . AV FISTULA PLACEMENT Left 04/10/2017   Procedure: INSERTION OF ARTERIOVENOUS (AV) GORE-TEX GRAFT REDO LEFT UPPER ARM;  Surgeon: Conrad Reeltown, MD;  Location:  [facility];  Service: Vascular;  Laterality: Left;  . CARDIAC CATHETERIZATION     last 2016  . CHOLECYSTECTOMY    . COLONOSCOPY    . D&Cs    . HEMATOMA EVACUATION Left 09/12/2016   Procedure: EVACUATION HEMATOMA;  Surgeon: Conrad Nelson Lagoon, MD;  Location: [facility];  Service: Vascular;  Laterality: Left;  . INSERTION OF DIALYSIS CATHETER Left 04/17/2017   Procedure: INSERTION OF DIALYSIS CATHETER LEFT GROIN;  Surgeon: Conrad [place], MD;  Location: [facility];  Service: Vascular;  Laterality: Left;  . INSERTION OF DIALYSIS CATHETER Right 04/10/2017   Procedure: INSERTION OF DIALYSIS CATHETER;  Surgeon: Conrad Pendleton, MD;  Location[facility];  Service: Vascular;  Laterality: Right;  . IR RADIOLOGIST EVAL & MGMT  03/13/2017  . LEFT HEART CATH AND CORONARY ANGIOGRAPHY N/A 11/22/2016   Procedure: LEFT HEART CATH AND CORONARY ANGIOGRAPHY;  Surgeon: Belva Crome, MD;  Location: [facility];  Service: Cardiovascular;  Laterality: N/A;  . LEFT HEART CATHETERIZATION WITH CORONARY ANGIOGRAM N/A 02/22/2011   Procedure: LEFT HEART CATHETERIZATION WITH CORONARY ANGIOGRAM;  Surgeon: Wellington Hampshire, MD;  Location: [facility] C[facility] LAB;  Service: Cardiovascular;  Laterality: N/A;  . left nephrectomy    . MULTIPLE TOOTH EXTRACTIONS    . NEPHRECTOMY Left    Malignant tumor  . PERIPHERAL VASCULAR CATHETERIZATION N/A 12/31/2014   Procedure: Abdominal Aortogram;  Surgeon: Conrad B[place], MD;  Location: [facility];  Service: Cardiovascular;  Laterality: N/A;  . right ankle repair    . RIGHT HEART CATHETERIZATION N/A 01/29/2014   Procedure: RIGHT HEART CATH;  Surgeon: Larey Dresser, MD;  Location: [facility];  Service: Cardiovascular;  Laterality: N/A;  . TONSILLECTOMY    . TUBAL LIGATION    . ULTRASOUND GUIDANCE FOR VASCULAR ACCESS  11/22/2016   Procedure: Ultrasound Guidance For Vascular Access;  Surgeon: Belva Crome, MD;  Location: [facility];  Service: Cardiovascular;;  . UPPER EXTREMITY VENOGRAPHY Bilateral  03/22/2017   Procedure: UPPER EXTREMITY VENOGRAPHY;  Surgeon: Conrad , MD;  Location: [facility];  Service: Cardiovascular;  Laterality: Bilateral;  bilateral  . VASCULAR SURGERY  11/2010   graft inserted to left arm       HPI  from the history and physical done on the day of admission:      72  y.o. female with history of atrial fibrillation, pulmonary hypertension, CAD, cardiomyopathy, hypothyroidism, ESRD on hemodialysis was brought to the ER after patient had a fall at home.  As per the daughter patient was trying to reach something over her bed after she reached home following dialysis.  Letter she was found on the floor.  It was not exactly known how she restart to the floor.  When the patient's daughter checked on the patient while she was on the floor she appeared confused and was initially not following commands well.  Patient did not have any incontinence of urine or tongue bite.  Did not complain of any chest pain or shortness of breath.  After around 10 minutes patient was back to baseline.  Patient states he recalls all the incident.  ED Course: In the ER patient was mildly hypotensive initially but improved slowly.  Tachycardic.  CT head was unremarkable.  Patient is afebrile.  On exam patient has significant lower extremity edema which is concerning for the family also since this happened in the last 2 days.  Patient denies chest pain or shortness of breath.  Denies any nausea vomiting or abdominal pain. Ct brain negative.  CXR no active cardiopulmonary disease      Hospital Course:       Pt was admitted for fluid overload (edema) as well as Afib with RVR, given cardizem x1 and continued w home metoprolol, cardizem,  and digoxin.  Pt hr improved.  Pt has not had any syncope or c/o chest pain or seizure during this admission.  She was dialyzed and her edema in her legs improved.  Pt was very happy about this.  Pt was noted to be hypotensive yesterday and had  dialysis and her bp improved.  Pt complains of toe pain yesterday (6/21)  and xray was negative for fracture.  Pt feels back to her baseline and is requesting discharged today.     Follow UP  Follow-up Information    [facility] Follow up.   Specialty:  [place]:  home health nurse and PT Contact information: [place] [address] [phone]        Glendale Chard, MD Follow up in 1 week(s).   Specialty:  Internal Medicine Contact information: [address] [place] [address] [phone]        Minus Breeding, MD .   Specialty:  Cardiology Contact information: [address] [place] [address] [phone]            Consults obtained - nephrology  Discharge Condition: stable  Diet and Activity recommendation: See Discharge Instructions below  Discharge Instructions          Discharge Medications     Allergies as of 06/23/2017      Reactions   Ace Inhibitors Anaphylaxis, Rash   Heparin Other (See Comments)   MDs told her not to take after reaction in ICU. Tolerates Lovenox fine   Iohexol Swelling, Other (See Comments)   1970s; passed out and had facial/tongue swelling.  Requires 13-hour prep with prednisone and benadryl   Phenytoin Other (See Comments)   Had reaction while in ICU; doesn't know.  MDs told her not to take ever again.   Wellbutrin [bupropion] Other (See Comments)   seizures   Meperidine Hcl Swelling, Rash   Makes tongue swell   Morphine Rash   Penicillins Hives, Rash   Has patient had a PCN reaction causing immediate rash,  facial/tongue/throat swelling, SOB or lightheadedness with hypotension: Yes Has patient had a PCN reaction causing severe rash involving mucus membranes or skin necrosis: No Has patient had a PCN reaction that required hospitalization: No Has patient had a PCN reaction occurring within the last 10 years: No If all of the above answers  are "NO", then may proceed with Cephalosporin use.   Valproic Acid And Related Other (See Comments)   Confusion   Iodinated Diagnostic Agents Other (See Comments)   Pre-meditate with benadryl and prednisone 3 times before appt.   Pentazocine Lactate Other (See Comments)   Patient does not remember reaction to this med ([place]).      Medication List    TAKE these medications   acetaminophen 500 MG tablet Commonly known as:  TYLENOL Take 1,000 mg by mouth daily as needed for mild pain.   albuterol 108 (90 Base) MCG/ACT inhaler Commonly known as:  PROVENTIL HFA;VENTOLIN HFA Inhale 1-2 puffs into the lungs every 6 (six) hours as needed for wheezing or shortness of breath.   aspirin 81 MG EC tablet Take 1 tablet (81 mg total) by mouth daily.   bisacodyl 5 MG EC tablet Commonly known as:  DULCOLAX Take 2 tablets (10 mg total) by mouth daily.   calcitRIOL 0.25 MCG capsule Commonly known as:  ROCALTROL Take 1 capsule (0.25 mcg total) by mouth every Monday, Wednesday, and Friday with hemodialysis. Start taking on:  06/25/2017   camphor-menthol lotion Commonly known as:  SARNA Apply topically as needed for itching. What changed:    how much to take  when to take this   cilostazol 100 MG tablet Commonly known as:  PLETAL Take 1 tablet (100 mg total) by mouth 2 (two) times daily before a meal.   Darbepoetin Alfa 60 MCG/0.3ML Sosy injection Commonly known as:  ARANESP Inject 0.3 mLs (60 mcg total) into the skin every Monday with hemodialysis.   digoxin 0.125 MG tablet Commonly known as:  LANOXIN Take 1 tablet (0.125 mg total) by mouth 3 (three) times a week.   diltiazem 60 MG tablet Commonly known as:  CARDIZEM Take 1 tablet (60 mg total) by mouth 3 (three) times daily. Take BP prior to each dose and Hold if SBP < 90   diphenhydrAMINE 50 MG capsule Commonly known as:  BENADRYL Take 1 capsule (50 mg total) by mouth every Monday, Wednesday, and Friday. On dialysis days     feeding supplement (NEPRO CARB STEADY) Liqd Take 237 mLs by mouth 2 (two) times daily between meals.   HYDROcodone-acetaminophen 5-325 MG tablet Commonly known as:  NORCO Take 1 tablet by mouth every 6 (six) hours as needed for moderate pain.   LETAIRIS 5 MG tablet Generic drug:  ambrisentan TAKE 1 TABLET (5MG ) BY MOUTH DAILY. DO NOT HANDLE IF PREGNANT. DO NOTSPLIT, CRUSH, OR CHEW. AVOID INHALATION AND CONTACT WITH SKIN OR EYES.   levETIRAcetam 500 MG tablet Commonly known as:  KEPPRA Take 500 mg by mouth daily at 8 pm.   levothyroxine 150 MCG tablet Commonly known as:  SYNTHROID, LEVOTHROID Take 150 mcg by mouth daily before breakfast.   Metoprolol Tartrate 75 MG Tabs Take 75 mg by mouth 2 (two) times daily.   multivitamin Tabs tablet Take 1 tablet by mouth at bedtime.   nitroGLYCERIN 0.4 MG SL tablet Commonly known as:  NITROSTAT Place 1 tablet (0.4 mg total) under the tongue every 5 (five) minutes as needed for chest pain.   omeprazole 20 MG tablet Commonly  known as:  PRILOSEC OTC Take 20 mg by mouth daily.   patiromer 8.4 g packet Commonly known as:  VELTASSA Take 1 packet (8.4 g total) by mouth daily.   polyethylene glycol packet Commonly known as:  MIRALAX / GLYCOLAX Take 17 g by mouth daily as needed for severe constipation. What changed:  Another medication with the same name was removed. Continue taking this medication, and follow the directions you see here.   pravastatin 40 MG tablet Commonly known as:  PRAVACHOL Take 1 tablet (40 mg total) by mouth at bedtime.   ranolazine 500 MG 12 hr tablet Commonly known as:  RANEXA Take 1 tablet (500 mg total) by mouth 2 (two) times daily.   senna-docusate 8.6-50 MG tablet Commonly known as:  Senokot-S Take 2 tablets by mouth at bedtime as needed for mild constipation or moderate constipation.   SENSIPAR 90 MG tablet Generic drug:  cinacalcet Take 90 mg by mouth every Monday, Wednesday, and Friday with  hemodialysis.   sevelamer carbonate 800 MG tablet Commonly known as:  RENVELA Take 800-1,600 mg by mouth See admin instructions. Take 1 tablet with snacks and 2 tablets with meals   traMADol 50 MG tablet Commonly known as:  ULTRAM Take 50 mg by mouth every 6 (six) hours as needed for moderate pain.   traZODone 50 MG tablet Commonly known as:  DESYREL Take 50 mg by mouth at bedtime as needed for sleep.   warfarin 2.5 MG tablet Commonly known as:  COUMADIN Take as directed. If you are unsure how to take this medication, talk to your nurse or doctor. Original instructions:  Take 10 mg Monday/wednesday/friday and 7.5 mg other days.check inr Monday April 29th. What changed:    how much to take  how to take this  when to take this  additional instructions       Major procedures and Radiology Reports - PLEASE review detailed and final reports for all details, in brief -      Dg Chest 2 View  Result Date: 06/20/2017 CLINICAL DATA:  Weakness EXAM: CHEST - 2 VIEW COMPARISON:  04/20/2017 FINDINGS: Shallow inspiration. Cardiac enlargement. Pulmonary vascularity is normal for technique. No definite infiltration or edema. No blunting of costophrenic angles. No pneumothorax. Calcification of the aorta. Degenerative changes in the spine and shoulders. IMPRESSION: Cardiac enlargement. No evidence of active pulmonary disease. Aortic atherosclerosis. Electronically Signed   By: Lucienne Capers M.D.   On: 06/20/2017 21:29   Ct Head Wo Contrast  Result Date: 06/20/2017 CLINICAL DATA:  Patient fell attempting to get out of bed. EXAM: CT HEAD WITHOUT CONTRAST TECHNIQUE: Contiguous axial images were obtained from the base of the skull through the vertex without intravenous contrast. COMPARISON:  08/24/2015 FINDINGS: Brain: Remote infarcts in the left frontal, parietal, bilateral cerebellar and left occipital lobes as before. No acute intracranial hemorrhage, intra-axial mass nor extra-axial fluid  collections. No hydrocephalus. Chronic microvascular ischemic disease. Vascular: No hyperdense vessel sign. Atherosclerosis of the cavernous internal carotids. Skull: No displaced calvarial fracture. Sinuses/Orbits: No acute finding. Other: No significant scalp swelling. IMPRESSION: 1. Chronic stable microvascular ischemic disease and chronic infarcts as above described. 2. No acute intracranial abnormality.  No skull fracture noted. Electronically Signed   By: Ashley Royalty M.D.   On: 06/20/2017 20:57   Dg Toe 2nd Right  Result Date: 06/22/2017 CLINICAL DATA:  Pain and swelling due to blunt trauma. EXAM: RIGHT SECOND TOE COMPARISON:  None. FINDINGS: There is no evidence of fracture  or dislocation. There is no evidence of arthropathy or other focal bone abnormality. Soft tissues are unremarkable. IMPRESSION: Negative. Electronically Signed   By: Lorriane Shire M.D.   On: 06/22/2017 09:46   Dg Toe 3rd Right  Result Date: 06/22/2017 CLINICAL DATA:  Pain and swelling after blunt trauma. EXAM: RIGHT THIRD TOE COMPARISON:  None. FINDINGS: There is no evidence of fracture or dislocation. There is no evidence of arthropathy or other focal bone abnormality. Soft tissues are unremarkable. IMPRESSION: Negative. Electronically Signed   By: Lorriane Shire M.D.   On: 06/22/2017 09:46    Micro Results     Recent Results (from the past 240 hour(s))  MRSA PCR Screening     Status: None   Collection Time: 06/21/17  1:18 AM  Result Value Ref Range Status   MRSA by PCR NEGATIVE NEGATIVE Final    Comment:        The GeneXpert MRSA Assay (FDA approved for NASAL specimens only), is one component of a comprehensive MRSA colonization surveillance program. It is not intended to diagnose MRSA infection nor to guide or monitor treatment for MRSA infections. Performed at [facility], [place] [address] [place], [place] [address]        Today   Subjective    Cylah Fannin today is feeling back to  baseline, edema has resolved with extra HD.  Pt denies syncope, cp, palp, sob, n/v, diarrhea, brbpr, black stool.   Pt has no headache, no new weakness tingling or numbness, feels much better wants to go home today.     Objective   Blood pressure 93/79, pulse (!) 106, temperature (!) 97.4 F (36.3 C), temperature source Oral, resp. rate 20, height 5\' 2"  (1.575 m), weight 73 kg (160 lb 15 oz), SpO2 100 %.   Intake/Output Summary (Last 24 hours) at 06/23/2017 0757 Last data filed at 06/23/2017 0600 Gross per 24 hour  Intake 480 ml  Output 1800 ml  Net -1320 ml    Exam Awake Alert, Oriented x 3, No new F.N deficits, Normal affect [place].AT,PERRAL Supple Neck,No JVD, No cervical lymphadenopathy appriciated.  Symmetrical Chest wall movement, Good air movement bilaterally, CTAB RRR s1, s2, 2/6 sem apex +ve B.Sounds, Abd Soft, Non tender, No organomegaly appriciated, No rebound -guarding or rigidity. No Cyanosis, Clubbing or edema, No new Rash or bruise L AVF   Data Review   CBC w Diff:  Lab Results  Component Value Date   WBC 3.8 (L) 06/22/2017   HGB 10.6 (L) 06/22/2017   HCT 33.6 (L) 06/22/2017   PLT 131 (L) 06/22/2017   LYMPHOPCT 21 06/20/2017   MONOPCT 16 06/20/2017   EOSPCT 3 06/20/2017   BASOPCT 2 06/20/2017    CMP:  Lab Results  Component Value Date   NA 137 06/22/2017   K 3.7 06/22/2017   K 4.9 01/16/2011   CL 104 06/22/2017   CO2 23 06/22/2017   BUN 26 (H) 06/22/2017   CREATININE 4.31 (H) 06/22/2017   CREATININE 4.16 (H) 06/04/2015   PROT 6.7 06/20/2017   ALBUMIN 2.4 (L) 06/22/2017   BILITOT 0.8 06/20/2017   ALKPHOS 79 06/20/2017   AST 31 06/20/2017   ALT 20 06/20/2017  .   Total Time in preparing paper work, data evaluation and todays exam - 83 minutes  Jani Gravel M.D on 06/23/2017 at 7:57 AM  [facility]   Office  [phone]

## 2017-06-25 DIAGNOSIS — E119 Type 2 diabetes mellitus without complications: Secondary | ICD-10-CM | POA: Diagnosis not present

## 2017-06-25 DIAGNOSIS — N2581 Secondary hyperparathyroidism of renal origin: Secondary | ICD-10-CM | POA: Diagnosis not present

## 2017-06-25 DIAGNOSIS — D631 Anemia in chronic kidney disease: Secondary | ICD-10-CM | POA: Diagnosis not present

## 2017-06-25 DIAGNOSIS — N186 End stage renal disease: Secondary | ICD-10-CM | POA: Diagnosis not present

## 2017-06-25 DIAGNOSIS — D509 Iron deficiency anemia, unspecified: Secondary | ICD-10-CM | POA: Diagnosis not present

## 2017-06-25 NOTE — Progress Notes (Signed)
Cardiology Office Note   Date:  06/26/2017   ID:  [name], DOB 1945/02/24, MRN [id number]  PCP:  Glendale Chard, MD  Cardiologist:   Minus Breeding, MD    Chief Complaint  Patient presents with  . Coronary Artery Disease      History of Present Illness: [name] is a 72 y.o. female who presents for follow up of nonobsructive coronary disease. She has end-stage renal disease and is on dialysis. She has had an extensive workup for dyspnea and this included an echocardiogram. This demonstrated pulmonary pressure to be 107 with an EF of 35-40%. She did have an evaluation in our office for consideration of an MRI because of a possible speckling on echo.  However, she could not have this done as she has some problems with claustrophobia. She had SPEP sent off. There was no protein spike on the SPEP.  She had a right heart cath with a normal wedge.  She was started on Letairis.  I saw her prior to popliteal bypass.  She had a perfusion study.  The study did not demonstrate an inferior scar but was a low risk study with no other areas of ischemia.  She was approved for surgery.  However, she had a very complicated course with hematoma requiring surgical exploration and a wound vac.  Since I last saw her she was in the hospital with atrial fib.  I reviewed these records for this visit.  She was treated with Xarelto and Cardizem. She had a NSTEMI.  Cath demonstrated severe mid RCA stenosis as described below.  This was diffuse and managed medically.    She was admitted in April with chest pain and noted to be anemic. She was transfused. She was also noted to have an infected [facility] that had just been placed 4/9. This was removed 04/15/17. She was discharged 04/19/17 and re admitted 04/20/17 with chest pain and AF with RVR.   She was most recently in the hospital and discharged 3 days ago.  I reviewed these records for this visit.    She had a fall at home.   She had hypotension, volume overload  and atrial fib with RVR.   She was treated with dialysis.   I reviewed these records for this visit.    I carefully reviewed the discharge meds.    In the few days since she has been home she has been weak but she is been a dialysis and tolerated this.  She has not had any syncope or presyncope.  She has not had any acute shortness of breath, PND or orthopnea.  She has no palpitations.  She has had chronic leg swelling.  Her feet are cold.  She has discomfort with her new left upper arm dialysis fistula.  She does say her diet is better.    Past Medical History:  Diagnosis Date  . Anemia    History  of Blood transfusion  . Asthma    PMH  . Atrial fibrillation with RVR ([place]) 11/2016  . CAD (coronary artery disease)    stent to RCA  . Cancer ([place])    clear cell cancer, kidney  . Complication of anesthesia 12/2010   pt is very confused, with AMS with anesthesia  . CVA (cerebral infarction) 2003   no apparent residual  . Dyslipidemia   . Encephalopathy   . ESRD 07/27/2008   ESRD due to HTN and NSAID's, started hemodialysis in 2005 in [place], [place].  Went to [facility] from 2010 to 2012 and since 2012 has been getting dialysis at [facility] on [address] in [place] on a MWF schedule. First access with RUA AVG placed in [place]. Next and current access was LUA AVG placed by Dr. Lucky Cowboy in [place] in or around 2012. Has had 2 or 3 procedures on that graft since placed per family. She gets her access work done here in [place] now. She is allergic to heparin and does not get any heparin at dialysis; she had an allergic reaction apparently when in ICU in the past.      . GERD (gastroesophageal reflux disease)   . Hyperlipidemia   . Hypertension   . Hypothyroidism   . Myocardial infarction ([place])    per pt  . Positive PPD    completed rifampin  . Pulmonary hypertension ([place])   . PVD (peripheral vascular disease) ([place])   . Seizures ([place])    last seizure 6 years ago  . Sleep  apnea    Sleep Study 2008  . Traumatic seroma of left lower leg ([place])   . Wears partial dentures     Past Surgical History:  Procedure Laterality Date  . ABDOMINAL AORTOGRAM W/LOWER EXTREMITY Bilateral 06/15/2016   Procedure: Abdominal Aortogram w/Lower Extremity;  Surgeon: Conrad Otter Tail, MD;  Location: [facility];  Service: Cardiovascular;  Laterality: Bilateral;  . ABDOMINAL HYSTERECTOMY    . APPENDECTOMY    . APPLICATION OF WOUND VAC Left 09/12/2016   Procedure: APPLICATION OF WOUND VAC;  Surgeon: Conrad Riverton, MD;  Location: [facility];  Service: Vascular;  Laterality: Left;  . ARTERY EXPLORATION Left 07/25/2016   Procedure: ARTERY EXPLORATION LEFT ABOVE KNEE POPLITEAL AND GROIN;  Surgeon: Conrad Tecopa, MD;  Location: [facility];  Service: Vascular;  Laterality: Left;  . AV FISTULA PLACEMENT Left 04/10/2017   Procedure: INSERTION OF ARTERIOVENOUS (AV) GORE-TEX GRAFT REDO LEFT UPPER ARM;  Surgeon: Conrad Santa Margarita, MD;  Location: [facility];  Service: Vascular;  Laterality: Left;  . CARDIAC CATHETERIZATION     last 2016  . CHOLECYSTECTOMY    . COLONOSCOPY    . D&Cs    . HEMATOMA EVACUATION Left 09/12/2016   Procedure: EVACUATION HEMATOMA;  Surgeon: Conrad Vandervoort, MD;  Location: [facility];  Service: Vascular;  Laterality: Left;  . INSERTION OF DIALYSIS CATHETER Left 04/17/2017   Procedure: INSERTION OF DIALYSIS CATHETER LEFT GROIN;  Surgeon: Conrad Langlois, MD;  Location: [facility];  Service: Vascular;  Laterality: Left;  . INSERTION OF DIALYSIS CATHETER Right 04/10/2017   Procedure: INSERTION OF DIALYSIS CATHETER;  Surgeon: Conrad Sherburne, MD;  [facility] OR;  Service: Vascular;  Laterality: Right;  . IR RADIOLOGIST EVAL & MGMT  03/13/2017  . LEFT HEART CATH AND CORONARY ANGIOGRAPHY N/A 11/22/2016   Procedure: LEFT HEART CATH AND CORONARY ANGIOGRAPHY;  Surgeon: Belva Crome, MD;  Location: [place] [facility];  Service: Cardiovascular;  Laterality: N/A;  . LEFT HEART CATHETERIZATION WITH CORONARY ANGIOGRAM  N/A 02/22/2011   Procedure: LEFT HEART CATHETERIZATION WITH CORONARY ANGIOGRAM;  Surgeon: Wellington Hampshire, MD;  Location: [facility];  Service: Cardiovascular;  Laterality: N/A;  . left nephrectomy    . MULTIPLE TOOTH EXTRACTIONS    . NEPHRECTOMY Left    Malignant tumor  . PERIPHERAL VASCULAR CATHETERIZATION N/A 12/31/2014   Procedure: Abdominal Aortogram;  Surgeon: Conrad Anoka, MD;  Location: [facility];  Service: Cardiovascular;  Laterality: N/A;  .  right ankle repair    . RIGHT HEART CATHETERIZATION N/A 01/29/2014   Procedure: RIGHT HEART CATH;  Surgeon: Larey Dresser, MD;  Location: [facility];  Service: Cardiovascular;  Laterality: N/A;  . TONSILLECTOMY    . TUBAL LIGATION    . ULTRASOUND GUIDANCE FOR VASCULAR ACCESS  11/22/2016   Procedure: Ultrasound Guidance For Vascular Access;  Surgeon: Belva Crome, MD;  Location: [facility];  Service: Cardiovascular;;  . UPPER EXTREMITY VENOGRAPHY Bilateral 03/22/2017   Procedure: UPPER EXTREMITY VENOGRAPHY;  Surgeon: Conrad Sabetha, MD;  Location: [facility];  Service: Cardiovascular;  Laterality: Bilateral;  bilateral  . VASCULAR SURGERY  11/2010   graft inserted to left arm     Current Outpatient Medications  Medication Sig Dispense Refill  . acetaminophen (TYLENOL) 500 MG tablet Take 1,000 mg by mouth daily as needed for mild pain.     [name] albuterol (PROVENTIL HFA;VENTOLIN HFA) 108 (90 Base) MCG/ACT inhaler Inhale 1-2 puffs into the lungs every 6 (six) hours as needed for wheezing or shortness of breath. 1 Inhaler 0  . aspirin EC 81 MG EC tablet Take 1 tablet (81 mg total) by mouth daily. 30 tablet 0  . calcitRIOL (ROCALTROL) 0.25 MCG capsule Take 1 capsule (0.25 mcg total) by mouth every Monday, Wednesday, and Friday with hemodialysis. 10 capsule 0  . camphor-menthol (SARNA) lotion Apply topically as needed for itching. (Patient taking differently: Apply 1 application topically daily as needed for itching. ) 222  mL 0  . cilostazol (PLETAL) 100 MG tablet Take 1 tablet (100 mg total) by mouth 2 (two) times daily before a meal. 60 tablet 11  . Darbepoetin Alfa (ARANESP) 60 MCG/0.3ML SOSY injection Inject 0.3 mLs (60 mcg total) into the skin every Monday with hemodialysis. 4.2 mL 0  . digoxin (LANOXIN) 0.125 MG tablet Take 1 tablet (0.125 mg total) by mouth 3 (three) times a week. 30 tablet 0  . diltiazem (CARDIZEM) 60 MG tablet Take 1 tablet (60 mg total) by mouth 3 (three) times daily. Take BP prior to each dose and Hold if SBP < 90 90 tablet 0  . diphenhydrAMINE (BENADRYL) 50 MG capsule Take 1 capsule (50 mg total) by mouth every Monday, Wednesday, and Friday. On dialysis days    . HYDROcodone-acetaminophen (NORCO) 5-325 MG tablet Take 1 tablet by mouth every 6 (six) hours as needed for moderate pain. 10 tablet 0  . LETAIRIS 5 MG tablet TAKE 1 TABLET (5MG ) BY MOUTH DAILY. DO NOT HANDLE IF PREGNANT. DO NOTSPLIT, CRUSH, OR CHEW. AVOID INHALATION AND CONTACT WITH SKIN OR EYES. 30 tablet 11  . levETIRAcetam (KEPPRA) 500 MG tablet Take 500 mg by mouth daily at 8 pm.    . levothyroxine (SYNTHROID, LEVOTHROID) 150 MCG tablet Take 150 mcg by mouth daily before breakfast.    . Metoprolol Tartrate 75 MG TABS Take 75 mg by mouth 2 (two) times daily. 60 tablet 0  . multivitamin (RENA-VIT) TABS tablet Take 1 tablet by mouth at bedtime. 30 each 0  . nitroGLYCERIN (NITROSTAT) 0.4 MG SL tablet Place 1 tablet (0.4 mg total) under the tongue every 5 (five) minutes as needed for chest pain. 25 tablet 1  . Nutritional Supplements (FEEDING SUPPLEMENT, NEPRO CARB STEADY,) LIQD Take 237 mLs by mouth 2 (two) times daily between meals. 60 Can 0  . omeprazole (PRILOSEC OTC) 20 MG tablet Take 20 mg by mouth daily.    . patiromer (VELTASSA) 8.4 g packet Take  1 packet (8.4 g total) by mouth daily. 30 packet 0  . polyethylene glycol (MIRALAX / GLYCOLAX) packet Take 17 g by mouth daily as needed for severe constipation. 14 each 0  .  pravastatin (PRAVACHOL) 40 MG tablet Take 1 tablet (40 mg total) by mouth at bedtime. 90 tablet 3  . ranolazine (RANEXA) 500 MG 12 hr tablet Take 1 tablet (500 mg total) by mouth 2 (two) times daily. 60 tablet 0  . senna-docusate (SENOKOT-S) 8.6-50 MG tablet Take 2 tablets by mouth at bedtime as needed for mild constipation or moderate constipation. 30 tablet 0  . SENSIPAR 90 MG tablet Take 90 mg by mouth every Monday, Wednesday, and Friday with hemodialysis.     [name] sevelamer carbonate (RENVELA) 800 MG tablet Take 800-1,600 mg by mouth See admin instructions. Take 1 tablet with snacks and 2 tablets with meals    . traMADol (ULTRAM) 50 MG tablet Take 50 mg by mouth every 6 (six) hours as needed for moderate pain.    . traZODone (DESYREL) 50 MG tablet Take 50 mg by mouth at bedtime as needed for sleep.     [name] warfarin (COUMADIN) 2.5 MG tablet Take 10 mg Monday/wednesday/friday and 7.5 mg other days.check inr Monday April 29th. (Patient taking differently: Take 7.5 mg by mouth daily. ) 90 tablet 1   No current facility-administered medications for this visit.    Facility-Administered Medications Ordered in Other Visits  Medication Dose Route Frequency Provider Last Rate Last Dose  . diphenhydrAMINE (BENADRYL) capsule 50 mg  50 mg Oral Once Aletta Edouard, MD        Allergies:   Ace inhibitors; Heparin; Iohexol; Phenytoin; Wellbutrin [bupropion]; Meperidine hcl; Morphine; Penicillins; Valproic acid and related; Iodinated diagnostic agents; and Pentazocine lactate    ROS:  Please see the history of present illness.   Otherwise, review of systems are positive none.   All other systems are reviewed and negative.    PHYSICAL EXAM:  VS:  BP 102/68   Pulse 93   Ht 5\' 2"  (1.575 m)   Wt 166 lb 6.4 oz (75.5 kg)   BMI 30.43 kg/m  , BMI Body mass index is 30.43 kg/m.  GENERAL:  Frail appearing NECK:  No jugular venous distention, waveform within normal limits, carotid upstroke brisk and symmetric, no  bruits, no thyromegaly LUNGS:  Clear to auscultation bilaterally CHEST:  Unremarkable HEART:  PMI not displaced or sustained,S1 and S2 within normal limits, no S3, no S4, no clicks, no rubs, 3 out of 6 apical systolic murmur radiating slightly at the aortic outflow tract, no diastolic murmurs ABD:  Flat, positive bowel sounds normal in frequency in pitch, no bruits, no rebound, no guarding, no midline pulsatile mass, no hepatomegaly, no splenomegaly EXT:  2 plus pulses throughout, moderate edema, no cyanosis no clubbing   EKG:  EKG is  noy ordered today.   CATH  Diagnostic  Dominance: Right  Left Main  Mid LM to Dist LM lesion 45% stenosed  Mid LM to Dist LM lesion is 45% stenosed.  Left Circumflex  Ost Cx lesion 80% stenosed  Ost Cx lesion is 80% stenosed.  Mid Cx to Dist Cx lesion 75% stenosed  Mid Cx to Dist Cx lesion is 75% stenosed.  Right Coronary Artery  Ost RCA to Prox RCA lesion 60% stenosed  Ost RCA to Prox RCA lesion is 60% stenosed. The lesion was previously treated.  Prox RCA to Mid RCA lesion 85% stenosed  Prox RCA  to Mid RCA lesion is 85% stenosed.  Intervention       Recent Labs: 04/14/2017: B Natriuretic Peptide 600.3 06/20/2017: ALT 20 06/21/2017: Magnesium 1.7; TSH 6.576 06/22/2017: BUN 26; Creatinine, Ser 4.31; Potassium 3.7; Sodium 137 06/23/2017: Hemoglobin 11.3; Platelets 97    Lipid Panel    Component Value Date/Time   CHOL 104 04/15/2017 0556   TRIG 54 04/15/2017 0556   HDL 42 04/15/2017 0556   CHOLHDL 2.5 04/15/2017 0556   VLDL 11 04/15/2017 0556   LDLCALC 51 04/15/2017 0556      Wt Readings from Last 3 Encounters:  06/26/17 166 lb 6.4 oz (75.5 kg)  06/22/17 160 lb 15 oz (73 kg)  05/21/17 163 lb 2.3 oz (74 kg)      Other studies Reviewed: Additional studies/ records that were reviewed today include:   Extensive review of hospital records with results recorded above.   Review of the above records demonstrates:         ASSESSMENT  AND PLAN:  CHRONIC SYSTOLIC/DIASTOLIC HF:   EF was 40 -45% on echo in April.  She does have chronic lower extremity swelling but at this point she is breathing okay and her lungs are clear and she is not hypotensive so I think I will make no adjustment to her meds.  PULMONARY HTN:   Pressure was moderately to moderately to severely elevated.  However, we have decided that this is to be managed conservatively without further cardiovascular testing.  Had this discussion with the patient when she was hospitalized previously.   ATRIAL FIB:   [name] has a CHA2DS2 - VASc score of 6.     She tolerates anticoagulation.  She will continue the meds as listed.  CAD: She has disease as described above.  She had obstructive RCA disease that was not amenable to percutaneous revascularization. She is not having active angina.  No change in therapy.     TOBACCO ABUSE: She is smoking sometimes and I asked her not to do this.   DYSLIPIDEMIA:   LDL was at target as above.    PVD:     This is followed by Dr. Bridgett Larsson.  She has reduced pulses in the left hand and feet   HTN:    Her BP is controlled.  She does not take her   Dry weight was recently increased because of her blood pressure.  We will have to watch this as we restart the Cardizem.  Current medicines are reviewed at length with the patient today.  The patient does not have concerns regarding medicines.  The following changes have been made: None  Labs/ tests ordered today include: None  No orders of the defined types were placed in this encounter.    Disposition:   FU with Kerin Ransom PAc in 1 month.      Signed, Minus Breeding, MD  06/26/2017 2:04 PM    [place]

## 2017-06-26 ENCOUNTER — Ambulatory Visit (INDEPENDENT_AMBULATORY_CARE_PROVIDER_SITE_OTHER): Payer: Medicare Other | Admitting: Cardiology

## 2017-06-26 ENCOUNTER — Ambulatory Visit (INDEPENDENT_AMBULATORY_CARE_PROVIDER_SITE_OTHER): Payer: Medicare Other | Admitting: Pharmacist Clinician (PhC)/ Clinical Pharmacy Specialist

## 2017-06-26 ENCOUNTER — Encounter: Payer: Self-pay | Admitting: Cardiology

## 2017-06-26 VITALS — BP 102/68 | HR 93 | Ht 62.0 in | Wt 166.4 lb

## 2017-06-26 DIAGNOSIS — I2510 Atherosclerotic heart disease of native coronary artery without angina pectoris: Secondary | ICD-10-CM

## 2017-06-26 DIAGNOSIS — I132 Hypertensive heart and chronic kidney disease with heart failure and with stage 5 chronic kidney disease, or end stage renal disease: Secondary | ICD-10-CM | POA: Diagnosis not present

## 2017-06-26 DIAGNOSIS — Z992 Dependence on renal dialysis: Secondary | ICD-10-CM | POA: Diagnosis not present

## 2017-06-26 DIAGNOSIS — I10 Essential (primary) hypertension: Secondary | ICD-10-CM | POA: Diagnosis not present

## 2017-06-26 DIAGNOSIS — I5032 Chronic diastolic (congestive) heart failure: Secondary | ICD-10-CM | POA: Diagnosis not present

## 2017-06-26 DIAGNOSIS — I482 Chronic atrial fibrillation, unspecified: Secondary | ICD-10-CM

## 2017-06-26 DIAGNOSIS — Z720 Tobacco use: Secondary | ICD-10-CM

## 2017-06-26 DIAGNOSIS — E785 Hyperlipidemia, unspecified: Secondary | ICD-10-CM | POA: Diagnosis not present

## 2017-06-26 DIAGNOSIS — I5042 Chronic combined systolic (congestive) and diastolic (congestive) heart failure: Secondary | ICD-10-CM | POA: Diagnosis not present

## 2017-06-26 DIAGNOSIS — I272 Pulmonary hypertension, unspecified: Secondary | ICD-10-CM

## 2017-06-26 DIAGNOSIS — I4891 Unspecified atrial fibrillation: Secondary | ICD-10-CM | POA: Diagnosis not present

## 2017-06-26 DIAGNOSIS — E1122 Type 2 diabetes mellitus with diabetic chronic kidney disease: Secondary | ICD-10-CM | POA: Diagnosis not present

## 2017-06-26 DIAGNOSIS — Z8673 Personal history of transient ischemic attack (TIA), and cerebral infarction without residual deficits: Secondary | ICD-10-CM | POA: Diagnosis not present

## 2017-06-26 DIAGNOSIS — N186 End stage renal disease: Secondary | ICD-10-CM | POA: Diagnosis not present

## 2017-06-26 DIAGNOSIS — Z7901 Long term (current) use of anticoagulants: Secondary | ICD-10-CM

## 2017-06-26 DIAGNOSIS — I70213 Atherosclerosis of native arteries of extremities with intermittent claudication, bilateral legs: Secondary | ICD-10-CM | POA: Diagnosis not present

## 2017-06-26 LAB — POCT INR: INR: 2.1 (ref 2.0–3.0)

## 2017-06-26 NOTE — Patient Instructions (Signed)
Medication Instructions:  Continue current medications  If you need a refill on your cardiac medications before your next appointment, please call your pharmacy.  Labwork: None ordered   Testing/Procedures: None Ordered  Follow-Up: Your physician wants you to follow-up in: Kerin Ransom in 10 days.      Thank you for choosing [facility]!!

## 2017-06-27 ENCOUNTER — Other Ambulatory Visit: Payer: Self-pay | Admitting: Cardiology

## 2017-06-27 DIAGNOSIS — D509 Iron deficiency anemia, unspecified: Secondary | ICD-10-CM | POA: Diagnosis not present

## 2017-06-27 DIAGNOSIS — Z8673 Personal history of transient ischemic attack (TIA), and cerebral infarction without residual deficits: Secondary | ICD-10-CM | POA: Diagnosis not present

## 2017-06-27 DIAGNOSIS — I5032 Chronic diastolic (congestive) heart failure: Secondary | ICD-10-CM | POA: Diagnosis not present

## 2017-06-27 DIAGNOSIS — N2581 Secondary hyperparathyroidism of renal origin: Secondary | ICD-10-CM | POA: Diagnosis not present

## 2017-06-27 DIAGNOSIS — N186 End stage renal disease: Secondary | ICD-10-CM | POA: Diagnosis not present

## 2017-06-27 DIAGNOSIS — E1122 Type 2 diabetes mellitus with diabetic chronic kidney disease: Secondary | ICD-10-CM | POA: Diagnosis not present

## 2017-06-27 DIAGNOSIS — D631 Anemia in chronic kidney disease: Secondary | ICD-10-CM | POA: Diagnosis not present

## 2017-06-27 DIAGNOSIS — Z992 Dependence on renal dialysis: Secondary | ICD-10-CM | POA: Diagnosis not present

## 2017-06-27 DIAGNOSIS — I132 Hypertensive heart and chronic kidney disease with heart failure and with stage 5 chronic kidney disease, or end stage renal disease: Secondary | ICD-10-CM | POA: Diagnosis not present

## 2017-06-27 DIAGNOSIS — I482 Chronic atrial fibrillation: Secondary | ICD-10-CM | POA: Diagnosis not present

## 2017-06-27 DIAGNOSIS — E119 Type 2 diabetes mellitus without complications: Secondary | ICD-10-CM | POA: Diagnosis not present

## 2017-06-27 MED ORDER — RANOLAZINE ER 500 MG PO TB12: 500.0000 mg | ORAL_TABLET | Freq: Two times a day (BID) | ORAL | 3 refills | Status: DC

## 2017-06-27 MED ORDER — METOPROLOL TARTRATE 75 MG PO TABS: 75.0000 mg | ORAL_TABLET | Freq: Two times a day (BID) | ORAL | 3 refills | Status: DC

## 2017-06-27 MED ORDER — PRAVASTATIN SODIUM 40 MG PO TABS: 40.0000 mg | ORAL_TABLET | Freq: Every day | ORAL | 3 refills | Status: DC

## 2017-06-27 MED ORDER — DILTIAZEM HCL 60 MG PO TABS: 60.0000 mg | ORAL_TABLET | Freq: Three times a day (TID) | ORAL | 3 refills | Status: DC

## 2017-06-27 NOTE — Telephone Encounter (Signed)
°*  STAT* If patient is at the pharmacy, call can be transferred to refill team.   1. Which medications need to be refilled? (please list name of each medication and dose if known) Metoprolol, Provastatin, Ranolazine and Diltiazem  2. Which pharmacy/location (including street and city if local pharmacy) is medication to be sent to?[facility] [phone] 3. Do they need a 30 day or 90 day supply? 90 and refills

## 2017-06-27 NOTE — Telephone Encounter (Signed)
Rx(s) sent to pharmacy electronically.  

## 2017-06-28 ENCOUNTER — Other Ambulatory Visit: Payer: Self-pay

## 2017-06-28 ENCOUNTER — Inpatient Hospital Stay (HOSPITAL_COMMUNITY)
Admission: EM | Admit: 2017-06-28 | Discharge: 2017-07-01 | DRG: 377 | Disposition: A | Discharge status: Hospice | Payer: Medicare Other | Attending: Internal Medicine | Admitting: Internal Medicine

## 2017-06-28 DIAGNOSIS — D6832 Hemorrhagic disorder due to extrinsic circulating anticoagulants: Secondary | ICD-10-CM | POA: Diagnosis present

## 2017-06-28 DIAGNOSIS — Z992 Dependence on renal dialysis: Secondary | ICD-10-CM

## 2017-06-28 DIAGNOSIS — K922 Gastrointestinal hemorrhage, unspecified: Secondary | ICD-10-CM | POA: Diagnosis present

## 2017-06-28 DIAGNOSIS — I132 Hypertensive heart and chronic kidney disease with heart failure and with stage 5 chronic kidney disease, or end stage renal disease: Secondary | ICD-10-CM | POA: Diagnosis present

## 2017-06-28 DIAGNOSIS — I739 Peripheral vascular disease, unspecified: Secondary | ICD-10-CM | POA: Diagnosis present

## 2017-06-28 DIAGNOSIS — Z79899 Other long term (current) drug therapy: Secondary | ICD-10-CM

## 2017-06-28 DIAGNOSIS — I428 Other cardiomyopathies: Secondary | ICD-10-CM | POA: Diagnosis present

## 2017-06-28 DIAGNOSIS — R5383 Other fatigue: Secondary | ICD-10-CM | POA: Diagnosis not present

## 2017-06-28 DIAGNOSIS — Z743 Need for continuous supervision: Secondary | ICD-10-CM | POA: Diagnosis not present

## 2017-06-28 DIAGNOSIS — I2510 Atherosclerotic heart disease of native coronary artery without angina pectoris: Secondary | ICD-10-CM | POA: Diagnosis present

## 2017-06-28 DIAGNOSIS — I4891 Unspecified atrial fibrillation: Secondary | ICD-10-CM | POA: Diagnosis not present

## 2017-06-28 DIAGNOSIS — Z8673 Personal history of transient ischemic attack (TIA), and cerebral infarction without residual deficits: Secondary | ICD-10-CM

## 2017-06-28 DIAGNOSIS — I482 Chronic atrial fibrillation, unspecified: Secondary | ICD-10-CM | POA: Insufficient documentation

## 2017-06-28 DIAGNOSIS — Z66 Do not resuscitate: Secondary | ICD-10-CM | POA: Diagnosis present

## 2017-06-28 DIAGNOSIS — Z90710 Acquired absence of both cervix and uterus: Secondary | ICD-10-CM

## 2017-06-28 DIAGNOSIS — I480 Paroxysmal atrial fibrillation: Secondary | ICD-10-CM | POA: Diagnosis present

## 2017-06-28 DIAGNOSIS — I5022 Chronic systolic (congestive) heart failure: Secondary | ICD-10-CM | POA: Diagnosis present

## 2017-06-28 DIAGNOSIS — N186 End stage renal disease: Secondary | ICD-10-CM | POA: Diagnosis present

## 2017-06-28 DIAGNOSIS — Z972 Presence of dental prosthetic device (complete) (partial): Secondary | ICD-10-CM

## 2017-06-28 DIAGNOSIS — I252 Old myocardial infarction: Secondary | ICD-10-CM | POA: Diagnosis not present

## 2017-06-28 DIAGNOSIS — T45515A Adverse effect of anticoagulants, initial encounter: Secondary | ICD-10-CM | POA: Diagnosis present

## 2017-06-28 DIAGNOSIS — D631 Anemia in chronic kidney disease: Secondary | ICD-10-CM | POA: Diagnosis present

## 2017-06-28 DIAGNOSIS — I679 Cerebrovascular disease, unspecified: Secondary | ICD-10-CM | POA: Diagnosis not present

## 2017-06-28 DIAGNOSIS — E785 Hyperlipidemia, unspecified: Secondary | ICD-10-CM | POA: Diagnosis present

## 2017-06-28 DIAGNOSIS — Z905 Acquired absence of kidney: Secondary | ICD-10-CM

## 2017-06-28 DIAGNOSIS — I272 Pulmonary hypertension, unspecified: Secondary | ICD-10-CM | POA: Diagnosis present

## 2017-06-28 DIAGNOSIS — E039 Hypothyroidism, unspecified: Secondary | ICD-10-CM | POA: Diagnosis present

## 2017-06-28 DIAGNOSIS — Z79891 Long term (current) use of opiate analgesic: Secondary | ICD-10-CM

## 2017-06-28 DIAGNOSIS — K921 Melena: Secondary | ICD-10-CM | POA: Diagnosis not present

## 2017-06-28 DIAGNOSIS — R195 Other fecal abnormalities: Secondary | ICD-10-CM | POA: Diagnosis not present

## 2017-06-28 DIAGNOSIS — Z91041 Radiographic dye allergy status: Secondary | ICD-10-CM

## 2017-06-28 DIAGNOSIS — D62 Acute posthemorrhagic anemia: Secondary | ICD-10-CM | POA: Diagnosis present

## 2017-06-28 DIAGNOSIS — G40909 Epilepsy, unspecified, not intractable, without status epilepticus: Secondary | ICD-10-CM | POA: Diagnosis present

## 2017-06-28 DIAGNOSIS — Z888 Allergy status to other drugs, medicaments and biological substances status: Secondary | ICD-10-CM

## 2017-06-28 DIAGNOSIS — Z885 Allergy status to narcotic agent status: Secondary | ICD-10-CM

## 2017-06-28 DIAGNOSIS — Z7982 Long term (current) use of aspirin: Secondary | ICD-10-CM

## 2017-06-28 DIAGNOSIS — D509 Iron deficiency anemia, unspecified: Secondary | ICD-10-CM | POA: Diagnosis not present

## 2017-06-28 DIAGNOSIS — Z515 Encounter for palliative care: Secondary | ICD-10-CM | POA: Diagnosis present

## 2017-06-28 DIAGNOSIS — Z87891 Personal history of nicotine dependence: Secondary | ICD-10-CM

## 2017-06-28 DIAGNOSIS — I10 Essential (primary) hypertension: Secondary | ICD-10-CM | POA: Diagnosis not present

## 2017-06-28 DIAGNOSIS — K219 Gastro-esophageal reflux disease without esophagitis: Secondary | ICD-10-CM | POA: Diagnosis present

## 2017-06-28 DIAGNOSIS — R279 Unspecified lack of coordination: Secondary | ICD-10-CM | POA: Diagnosis not present

## 2017-06-28 DIAGNOSIS — Z7901 Long term (current) use of anticoagulants: Secondary | ICD-10-CM

## 2017-06-28 DIAGNOSIS — Z9049 Acquired absence of other specified parts of digestive tract: Secondary | ICD-10-CM | POA: Diagnosis not present

## 2017-06-28 DIAGNOSIS — K625 Hemorrhage of anus and rectum: Secondary | ICD-10-CM | POA: Diagnosis not present

## 2017-06-28 DIAGNOSIS — Z8249 Family history of ischemic heart disease and other diseases of the circulatory system: Secondary | ICD-10-CM

## 2017-06-28 DIAGNOSIS — R531 Weakness: Secondary | ICD-10-CM | POA: Diagnosis not present

## 2017-06-28 DIAGNOSIS — R58 Hemorrhage, not elsewhere classified: Secondary | ICD-10-CM | POA: Diagnosis not present

## 2017-06-28 DIAGNOSIS — G4733 Obstructive sleep apnea (adult) (pediatric): Secondary | ICD-10-CM | POA: Diagnosis present

## 2017-06-28 DIAGNOSIS — Z880 Allergy status to penicillin: Secondary | ICD-10-CM

## 2017-06-28 DIAGNOSIS — Z79890 Hormone replacement therapy (postmenopausal): Secondary | ICD-10-CM

## 2017-06-28 DIAGNOSIS — Z85528 Personal history of other malignant neoplasm of kidney: Secondary | ICD-10-CM

## 2017-06-28 DIAGNOSIS — Z955 Presence of coronary angioplasty implant and graft: Secondary | ICD-10-CM

## 2017-06-28 LAB — COMPREHENSIVE METABOLIC PANEL
ALBUMIN: 3.1 g/dL — AB (ref 3.5–5.0)
ALT: 26 U/L (ref 0–44)
AST: 37 U/L (ref 15–41)
Alkaline Phosphatase: 76 U/L (ref 38–126)
Anion gap: 17 — ABNORMAL HIGH (ref 5–15)
BUN: 63 mg/dL — AB (ref 8–23)
CHLORIDE: 94 mmol/L — AB (ref 98–111)
CO2: 26 mmol/L (ref 22–32)
Calcium: 8.7 mg/dL — ABNORMAL LOW (ref 8.9–10.3)
Creatinine, Ser: 6.37 mg/dL — ABNORMAL HIGH (ref 0.44–1.00)
GFR calc Af Amer: 7 mL/min — ABNORMAL LOW (ref >60)
GFR calc non Af Amer: 6 mL/min — ABNORMAL LOW (ref >60)
GLUCOSE: 70 mg/dL (ref 70–99)
POTASSIUM: 5.1 mmol/L (ref 3.5–5.1)
Sodium: 137 mmol/L (ref 135–145)
Total Bilirubin: 1 mg/dL (ref 0.3–1.2)
Total Protein: 6.6 g/dL (ref 6.5–8.1)

## 2017-06-28 LAB — CBC WITH DIFFERENTIAL/PLATELET
ABS IMMATURE GRANULOCYTES: 0 10*3/uL (ref 0.0–0.1)
Basophils Absolute: 0 10*3/uL (ref 0.0–0.1)
Basophils Relative: 1 %
Eosinophils Absolute: 0.1 10*3/uL (ref 0.0–0.7)
Eosinophils Relative: 3 %
HEMATOCRIT: 31.1 % — AB (ref 36.0–46.0)
Hemoglobin: 10 g/dL — ABNORMAL LOW (ref 12.0–15.0)
IMMATURE GRANULOCYTES: 0 %
LYMPHS ABS: 1.1 10*3/uL (ref 0.7–4.0)
Lymphocytes Relative: 28 %
MCH: 27 pg (ref 26.0–34.0)
MCHC: 32.2 g/dL (ref 30.0–36.0)
MCV: 83.8 fL (ref 78.0–100.0)
Monocytes Absolute: 0.6 10*3/uL (ref 0.1–1.0)
Monocytes Relative: 14 %
NEUTROS ABS: 2.2 10*3/uL (ref 1.7–7.7)
NEUTROS PCT: 54 %
PLATELETS: 158 10*3/uL (ref 150–400)
RBC: 3.71 MIL/uL — AB (ref 3.87–5.11)
RDW: 15.6 % — ABNORMAL HIGH (ref 11.5–15.5)
WBC: 4.1 10*3/uL (ref 4.0–10.5)

## 2017-06-28 LAB — PROTIME-INR
INR: 2.71
Prothrombin Time: 28.6 seconds — ABNORMAL HIGH (ref 11.4–15.2)

## 2017-06-28 LAB — CBC
HEMATOCRIT: 28 % — AB (ref 36.0–46.0)
Hemoglobin: 9.1 g/dL — ABNORMAL LOW (ref 12.0–15.0)
MCH: 27.3 pg (ref 26.0–34.0)
MCHC: 32.5 g/dL (ref 30.0–36.0)
MCV: 84.1 fL (ref 78.0–100.0)
PLATELETS: 157 10*3/uL (ref 150–400)
RBC: 3.33 MIL/uL — ABNORMAL LOW (ref 3.87–5.11)
RDW: 15.8 % — AB (ref 11.5–15.5)
WBC: 4.2 10*3/uL (ref 4.0–10.5)

## 2017-06-28 LAB — POC OCCULT BLOOD, ED: Fecal Occult Bld: POSITIVE — AB

## 2017-06-28 MED ORDER — CALCITRIOL 0.25 MCG PO CAPS: 0.2500 ug | ORAL_CAPSULE | ORAL | Status: DC

## 2017-06-28 MED ORDER — LEVETIRACETAM 500 MG PO TABS
500.0000 mg | ORAL_TABLET | Freq: Every day | ORAL | Status: DC
  Administered 2017-06-28 – 2017-06-30 (×3): 500 mg via ORAL
  Filled 2017-06-28 (×3): qty 1

## 2017-06-28 MED ORDER — ZOLPIDEM TARTRATE 5 MG PO TABS
5.0000 mg | ORAL_TABLET | Freq: Every evening | ORAL | Status: DC | PRN
  Administered 2017-06-29: 5 mg via ORAL
  Filled 2017-06-28: qty 1

## 2017-06-28 MED ORDER — LORAZEPAM 2 MG/ML IJ SOLN: 1.0000 mg | INTRAMUSCULAR | Status: DC | PRN

## 2017-06-28 MED ORDER — SEVELAMER CARBONATE 800 MG PO TABS: 1600.0000 mg | ORAL_TABLET | Freq: Three times a day (TID) | ORAL | Status: DC

## 2017-06-28 MED ORDER — VITAMIN K1 10 MG/ML IJ SOLN
10.0000 mg | Freq: Once | INTRAMUSCULAR | Status: AC
  Administered 2017-06-28: 10 mg via INTRAVENOUS
  Filled 2017-06-28: qty 1

## 2017-06-28 MED ORDER — DIPHENHYDRAMINE HCL 50 MG/ML IJ SOLN: 25.0000 mg | Freq: Once | INTRAMUSCULAR | Status: DC

## 2017-06-28 MED ORDER — METOPROLOL TARTRATE 25 MG PO TABS
75.0000 mg | ORAL_TABLET | Freq: Two times a day (BID) | ORAL | Status: DC
  Administered 2017-06-28: 75 mg via ORAL
  Filled 2017-06-28: qty 1

## 2017-06-28 MED ORDER — SENNOSIDES-DOCUSATE SODIUM 8.6-50 MG PO TABS
2.0000 | ORAL_TABLET | Freq: Every evening | ORAL | Status: DC | PRN
  Filled 2017-06-28: qty 2

## 2017-06-28 MED ORDER — AMBRISENTAN 5 MG PO TABS
5.0000 mg | ORAL_TABLET | Freq: Every day | ORAL | Status: DC
  Administered 2017-06-28 – 2017-07-01 (×4): 5 mg via ORAL
  Filled 2017-06-28 (×4): qty 1

## 2017-06-28 MED ORDER — DIGOXIN 125 MCG PO TABS
0.1250 mg | ORAL_TABLET | ORAL | Status: DC
  Administered 2017-06-29: 0.125 mg via ORAL
  Filled 2017-06-28: qty 1

## 2017-06-28 MED ORDER — LEVOTHYROXINE SODIUM 75 MCG PO TABS
150.0000 ug | ORAL_TABLET | Freq: Every day | ORAL | Status: DC
  Administered 2017-06-30 – 2017-07-01 (×2): 150 ug via ORAL
  Filled 2017-06-28 (×2): qty 2
  Filled 2017-06-28: qty 1

## 2017-06-28 MED ORDER — PANTOPRAZOLE SODIUM 40 MG IV SOLR
40.0000 mg | Freq: Two times a day (BID) | INTRAVENOUS | Status: DC
  Administered 2017-06-28 – 2017-07-01 (×6): 40 mg via INTRAVENOUS
  Filled 2017-06-28 (×6): qty 40

## 2017-06-28 MED ORDER — METOPROLOL TARTRATE 75 MG PO TABS: 75.0000 mg | ORAL_TABLET | Freq: Two times a day (BID) | ORAL | Status: DC

## 2017-06-28 MED ORDER — SEVELAMER CARBONATE 800 MG PO TABS: 800.0000 mg | ORAL_TABLET | Freq: Two times a day (BID) | ORAL | Status: DC | PRN

## 2017-06-28 MED ORDER — DIPHENHYDRAMINE HCL 25 MG PO CAPS: 50.0000 mg | ORAL_CAPSULE | ORAL | Status: DC

## 2017-06-28 MED ORDER — TRAZODONE HCL 50 MG PO TABS
50.0000 mg | ORAL_TABLET | Freq: Every evening | ORAL | Status: DC | PRN
  Administered 2017-06-30: 50 mg via ORAL
  Filled 2017-06-28: qty 1

## 2017-06-28 MED ORDER — CINACALCET HCL 30 MG PO TABS
90.0000 mg | ORAL_TABLET | ORAL | Status: DC
  Filled 2017-06-28: qty 3

## 2017-06-28 MED ORDER — ALBUTEROL SULFATE (2.5 MG/3ML) 0.083% IN NEBU
2.5000 mg | INHALATION_SOLUTION | RESPIRATORY_TRACT | Status: DC | PRN
Start: 2017-06-28 — End: 2017-07-01

## 2017-06-28 MED ORDER — RANOLAZINE ER 500 MG PO TB12
500.0000 mg | ORAL_TABLET | Freq: Two times a day (BID) | ORAL | Status: DC
  Administered 2017-06-28 – 2017-07-01 (×6): 500 mg via ORAL
  Filled 2017-06-28 (×6): qty 1

## 2017-06-28 MED ORDER — NITROGLYCERIN 0.4 MG SL SUBL: 0.4000 mg | SUBLINGUAL_TABLET | SUBLINGUAL | Status: DC | PRN

## 2017-06-28 MED ORDER — PRAVASTATIN SODIUM 40 MG PO TABS
40.0000 mg | ORAL_TABLET | Freq: Every day | ORAL | Status: DC
  Administered 2017-06-28 – 2017-06-30 (×3): 40 mg via ORAL
  Filled 2017-06-28 (×3): qty 1

## 2017-06-28 MED ORDER — ONDANSETRON HCL 4 MG/2ML IJ SOLN: 4.0000 mg | Freq: Four times a day (QID) | INTRAMUSCULAR | Status: DC | PRN

## 2017-06-28 MED ORDER — DIPHENHYDRAMINE HCL 50 MG/ML IJ SOLN
25.0000 mg | Freq: Once | INTRAMUSCULAR | Status: AC
  Administered 2017-06-28: 25 mg via INTRAVENOUS
  Filled 2017-06-28: qty 1

## 2017-06-28 MED ORDER — CILOSTAZOL 100 MG PO TABS
100.0000 mg | ORAL_TABLET | Freq: Two times a day (BID) | ORAL | Status: DC
  Administered 2017-06-29 – 2017-07-01 (×4): 100 mg via ORAL
  Filled 2017-06-28 (×4): qty 1

## 2017-06-28 MED ORDER — HYDROCODONE-ACETAMINOPHEN 5-325 MG PO TABS
1.0000 | ORAL_TABLET | Freq: Four times a day (QID) | ORAL | Status: DC | PRN
  Administered 2017-06-29 – 2017-06-30 (×2): 1 via ORAL
  Filled 2017-06-28 (×2): qty 1

## 2017-06-28 MED ORDER — RENA-VITE PO TABS: 1.0000 | ORAL_TABLET | Freq: Every day | ORAL | Status: DC

## 2017-06-28 MED ORDER — HYDRALAZINE HCL 20 MG/ML IJ SOLN: 5.0000 mg | INTRAMUSCULAR | Status: DC | PRN

## 2017-06-28 MED ORDER — CAMPHOR-MENTHOL 0.5-0.5 % EX LOTN
1.0000 "application " | TOPICAL_LOTION | Freq: Every day | CUTANEOUS | Status: DC | PRN
  Administered 2017-06-28 – 2017-06-29 (×2): 1 via TOPICAL
  Filled 2017-06-28 (×3): qty 222

## 2017-06-28 MED ORDER — ACETAMINOPHEN 325 MG PO TABS: 650.0000 mg | ORAL_TABLET | Freq: Every day | ORAL | Status: DC | PRN

## 2017-06-28 MED ORDER — DILTIAZEM HCL 60 MG PO TABS
60.0000 mg | ORAL_TABLET | Freq: Three times a day (TID) | ORAL | Status: DC
  Administered 2017-06-28: 60 mg via ORAL
  Filled 2017-06-28: qty 1

## 2017-06-28 MED ORDER — SODIUM CHLORIDE 0.9% IV SOLUTION
Freq: Once | INTRAVENOUS | Status: AC
  Administered 2017-06-28: 23:00:00 via INTRAVENOUS

## 2017-06-28 NOTE — ED Notes (Signed)
Pt had a large bloody bowel movement. Pt cleaned up and bed linen changed. Ben RN and Dr. Blaine Hamper made aware

## 2017-06-28 NOTE — ED Notes (Signed)
Attempted report 

## 2017-06-28 NOTE — Progress Notes (Signed)
Pt arrived to unit accompanied by ER nurse and daughter. Pt used the bathroom once before getting into bed. BM was dark brown. Will continue to monitor the pt.

## 2017-06-28 NOTE — ED Triage Notes (Signed)
Patient sent to ED from physicians office "because they were closing" for a diagnosed GI Bleed.

## 2017-06-28 NOTE — ED Provider Notes (Addendum)
[facility]GENCY DEPARTMENT Provider Note   CSN: [id number] Arrival date & time: 06/28/17  1617     History   Chief Complaint Chief Complaint  Patient presents with  . GI Bleeding    HPI [name] is a 72 y.o. female. CC: Weakness, bloody stools.  HPI: 72 year old female.  History of ESRD.  Dialyzed yesterday.  History of A. fib.  Rate controlled with Cardizem, digoxin, anticoagulation with Coumadin.  Recent admission for AF RVR.  Discharge 6/19.  States on Friday, 6 days ago before leaving the hospital did have a bloody stool.  States this has continued.  Occasionally dark.  Occasionally "maroon-colored".  Progressive fatigue.  Not syncopal.  Presents here today with ongoing fatigue and concern about her bloody stools.  Past Medical History:  Diagnosis Date  . Anemia    History  of Blood transfusion  . Asthma    PMH  . Atrial fibrillation with RVR ([place]) 11/2016  . CAD (coronary artery disease)    stent to RCA  . Cancer ([place])    clear cell cancer, kidney  . Complication of anesthesia 12/2010   pt is very confused, with AMS with anesthesia  . CVA (cerebral infarction) 2003   no apparent residual  . Dyslipidemia   . Encephalopathy   . ESRD 07/27/2008   ESRD due to HTN and NSAID's, started hemodialysis in 2005 in [place], [place]. Went to [facility] from 2010 to 2012 and since 2012 has been getting dialysis at [facility] on [address] in [place] on a MWF schedule. First access with RUA AVG placed in [place]. Next and current access was LUA AVG placed by Dr. Lucky Cowboy in [place] in or around 2012. Has had 2 or 3 procedures on that graft since placed per family. She gets her access work done here in [place] now. She is allergic to heparin and does not get any heparin at dialysis; she had an allergic reaction apparently when in ICU in the past.      . GERD (gastroesophageal reflux disease)   . Hyperlipidemia   . Hypertension   .  Hypothyroidism   . Myocardial infarction ([place])    per pt  . Positive PPD    completed rifampin  . Pulmonary hypertension ([place])   . PVD (peripheral vascular disease) ([place])   . Seizures ([place])    last seizure 6 years ago  . Sleep apnea    Sleep Study 2008  . Traumatic seroma of left lower leg ([place])   . Wears partial dentures     Patient Active Problem List   Diagnosis Date Noted  . Unspecified atrial fibrillation ([place]) 06/21/2017  . Near syncope   . Fluid overload 06/20/2017  . Cardiomyopathy ([place]) 05/03/2017  . Elevated troponin 04/20/2017  . Infection of hemodialysis tunneled catheter ([place]) 04/19/2017  . Chest pain at rest 04/19/2017  . Long term (current) use of anticoagulants [Z79.01] 12/08/2016  . Atrial fibrillation with RVR ([place]) 11/20/2016  . Precordial pain 10/26/2016  . Hematoma 09/12/2016  . Dyslipidemia 05/25/2016  . Atherosclerosis of native arteries of extremity with intermittent claudication ([place]) 12/16/2014  . Nausea vomiting and diarrhea 09/03/2014  . Hyperkalemia 09/03/2014  . Diarrhea 09/03/2014  . Pulmonary hypertension ([place]) 09/23/2012  . Hyperkalemia, diminished renal excretion 06/27/2012  . Generalized convulsive epilepsy without mention of intractable epilepsy 09/14/2011  . Epilepsy ([place]) 05/04/2011  . Physical deconditioning 05/04/2011  . HTN (hypertension) 04/30/2011  . Depression 04/30/2011  . GERD (gastroesophageal  reflux disease) 04/30/2011  . Acute pulmonary edema ([place]) 04/29/2011  . Nonspecific abnormal electrocardiogram (ECG) (EKG) 02/23/2011  . Essential hypertension, malignant 05/11/2009  . TOBACCO USER 07/27/2008  . Coronary atherosclerosis 07/27/2008  . History of CVA (cerebrovascular accident) 07/27/2008  . PVD 07/27/2008  . ESRD 07/27/2008    Past Surgical History:  Procedure Laterality Date  . ABDOMINAL AORTOGRAM W/LOWER EXTREMITY Bilateral 06/15/2016   Procedure: Abdominal Aortogram w/Lower Extremity;  Surgeon: Conrad [place],  MD;  Location: [facility];  Service: Cardiovascular;  Laterality: Bilateral;  . ABDOMINAL HYSTERECTOMY    . APPENDECTOMY    . APPLICATION OF WOUND VAC Left 09/12/2016   Procedure: APPLICATION OF WOUND VAC;  Surgeon: Conrad [place], MD;  Location: [facility];  Service: Vascular;  Laterality: Left;  . ARTERY EXPLORATION Left 07/25/2016   Procedure: ARTERY EXPLORATION LEFT ABOVE KNEE POPLITEAL AND GROIN;  Surgeon: Conrad Hazel Crest, MD;  Location: [facility];  Service: Vascular;  Laterality: Left;  . AV FISTULA PLACEMENT Left 04/10/2017   Procedure: INSERTION OF ARTERIOVENOUS (AV) GORE-TEX GRAFT REDO LEFT UPPER ARM;  Surgeon: Conrad [place], MD;  Location: [facility];  Service: Vascular;  Laterality: Left;  . CARDIAC CATHETERIZATION     last 2016  . CHOLECYSTECTOMY    . COLONOSCOPY    . D&Cs    . HEMATOMA EVACUATION Left 09/12/2016   Procedure: EVACUATION HEMATOMA;  Surgeon: Conrad River Road, MD;  Location: [facility];  Service: Vascular;  Laterality: Left;  . INSERTION OF DIALYSIS CATHETER Left 04/17/2017   Procedure: INSERTION OF DIALYSIS CATHETER LEFT GROIN;  Surgeon: Conrad Taz[place]ell, MD;  Location: [facility];  Service: Vascular;  Laterality: Left;  . INSERTION OF DIALYSIS CATHETER Right 04/10/2017   Procedure: INSERTION OF DIALYSIS CATHETER;  Surgeon: Conrad Ecorse, MD;  Location: [facility] OR;  Service: Vascular;  Laterality: Right;  . IR RADIOLOGIST EVAL & MGMT  03/13/2017  . LEFT HEART CATH AND CORONARY ANGIOGRAPHY N/A 11/22/2016   Procedure: LEFT HEART CATH AND CORONARY ANGIOGRAPHY;  Surgeon: Belva Crome, MD;  Location: [facility];  Service: Cardiovascular;  Laterality: N/A;  . LEFT HEART CATHETERIZATION WITH CORONARY ANGIOGRAM N/A 02/22/2011   Procedure: LEFT HEART CATHETERIZATION WITH CORONARY ANGIOGRAM;  Surgeon: Wellington Hampshire, MD;  Location: [facility] C[facility] LAB;  Service: Cardiovascular;  Laterality: N/A;  . left nephrectomy    . MULTIPLE TOOTH EXTRACTIONS    . NEPHRECTOMY Left    Malignant tumor  . PERIPHERAL  VASCULAR CATHETERIZATION N/A 12/31/2014   Procedure: Abdominal Aortogram;  Surgeon: Conrad Allen, MD;  Location: [facility];  Service: Cardiovascular;  Laterality: N/A;  . right ankle repair    . RIGHT HEART CATHETERIZATION N/A 01/29/2014   Procedure: RIGHT HEART CATH;  Surgeon: Larey Dresser, MD;  Location: [facility] C[facility] LAB;  Service: Cardiovascular;  Laterality: N/A;  . TONSILLECTOMY    . TUBAL LIGATION    . ULTRASOUND GUIDANCE FOR VASCULAR ACCESS  11/22/2016   Procedure: Ultrasound Guidance For Vascular Access;  Surgeon: Belva Crome, MD;  Location: [facility];  Service: Cardiovascular;;  . UPPER EXTREMITY VENOGRAPHY Bilateral 03/22/2017   Procedure: UPPER EXTREMITY VENOGRAPHY;  Surgeon: Conrad , MD;  Location: [facility];  Service: Cardiovascular;  Laterality: Bilateral;  bilateral  . VASCULAR SURGERY  11/2010   graft inserted to left arm     OB History   None      Home Medications  Prior to Admission medications   Medication Sig Start Date End Date Taking? Authorizing Provider  acetaminophen (TYLENOL) 500 MG tablet Take 1,000 mg by mouth daily as needed for mild pain.     [provider]  albuterol (PROVENTIL HFA;VENTOLIN HFA) 108 (90 Base) MCG/ACT inhaler Inhale 1-2 puffs into the lungs every 6 (six) hours as needed for wheezing or shortness of breath. 01/05/17   Marney Setting, NP  aspirin EC 81 MG EC tablet Take 1 tablet (81 mg total) by mouth daily. 04/20/17   Debbe Odea, MD  calcitRIOL (ROCALTROL) 0.25 MCG capsule Take 1 capsule (0.25 mcg total) by mouth every Monday, Wednesday, and Friday with hemodialysis. 06/25/17   Jani Gravel, MD  camphor-menthol [facility]) lotion Apply topically as needed for itching. Patient taking differently: Apply 1 application topically daily as needed for itching.  04/26/17   Georgette Shell, MD  cilostazol (PLETAL) 100 MG tablet Take 1 tablet (100 mg total) by mouth 2 (two) times daily before a meal.  06/19/17   Conrad Williamson, MD  Darbepoetin Alfa (ARANESP) 60 MCG/0.3ML SOSY injection Inject 0.3 mLs (60 mcg total) into the skin every Monday with hemodialysis. 04/26/17   Georgette Shell, MD  digoxin (LANOXIN) 0.125 MG tablet Take 1 tablet (0.125 mg total) by mouth 3 (three) times a week. 04/19/17   Debbe Odea, MD  diltiazem (CARDIZEM) 60 MG tablet Take 1 tablet (60 mg total) by mouth 3 (three) times daily. Take BP prior to each dose and Hold if SBP < 90 06/27/17   Minus Breeding, MD  diphenhydrAMINE (BENADRYL) 50 MG capsule Take 1 capsule (50 mg total) by mouth every Monday, Wednesday, and Friday. On dialysis days 09/15/16   Gabriel Earing, PA-C  HYDROcodone-acetaminophen (NORCO) 5-325 MG tablet Take 1 tablet by mouth every 6 (six) hours as needed for moderate pain. 04/10/17   Dagoberto Ligas, PA-C  LETAIRIS 5 MG tablet TAKE 1 TABLET (5MG ) BY MOUTH DAILY. DO NOT HANDLE IF PREGNANT. DO NOTSPLIT, CRUSH, OR CHEW. AVOID INHALATION AND CONTACT WITH SKIN OR EYES. 09/18/16   Juanito Doom, MD  levETIRAcetam (KEPPRA) 500 MG tablet Take 500 mg by mouth daily at 8 pm.    [provider]  levothyroxine (SYNTHROID, LEVOTHROID) 150 MCG tablet Take 150 mcg by mouth daily before breakfast.    [provider]  Metoprolol Tartrate 75 MG TABS Take 75 mg by mouth 2 (two) times daily. 06/27/17   Minus Breeding, MD  multivitamin (RENA-VIT) TABS tablet Take 1 tablet by mouth at bedtime. 04/19/17   Debbe Odea, MD  nitroGLYCERIN (NITROSTAT) 0.4 MG SL tablet Place 1 tablet (0.4 mg total) under the tongue every 5 (five) minutes as needed for chest pain. 10/26/16   Minus Breeding, MD  Nutritional Supplements (FEEDING SUPPLEMENT, NEPRO CARB STEADY,) LIQD Take 237 mLs by mouth 2 (two) times daily between meals. 06/23/17   Jani Gravel, MD  omeprazole (PRILOSEC OTC) 20 MG tablet Take 20 mg by mouth daily.    [provider]  patiromer (VELTASSA) 8.4 g packet Take 1 packet (8.4 g total) by  mouth daily. 06/23/17   Jani Gravel, MD  polyethylene glycol [facility] Floria Raveling) packet Take 17 g by mouth daily as needed for severe constipation. 04/26/17   Georgette Shell, MD  pravastatin (PRAVACHOL) 40 MG tablet Take 1 tablet (40 mg total) by mouth at bedtime. 06/27/17   Minus Breeding, MD  ranolazine (RANEXA) 500 MG 12 hr tablet Take 1 tablet (  500 mg total) by mouth 2 (two) times daily. 06/27/17   Minus Breeding, MD  senna-docusate (SENOKOT-S) 8.6-50 MG tablet Take 2 tablets by mouth at bedtime as needed for mild constipation or moderate constipation. 04/19/17   Debbe Odea, MD  SENSIPAR 90 MG tablet Take 90 mg by mouth every Monday, Wednesday, and Friday with hemodialysis.  03/05/14   [provider]  sevelamer carbonate (RENVELA) 800 MG tablet Take 800-1,600 mg by mouth See admin instructions. Take 1 tablet with snacks and 2 tablets with meals    [provider]  traMADol (ULTRAM) 50 MG tablet Take 50 mg by mouth every 6 (six) hours as needed for moderate pain.    [provider]  traZODone (DESYREL) 50 MG tablet Take 50 mg by mouth at bedtime as needed for sleep.     [provider]  warfarin (COUMADIN) 2.5 MG tablet Take 10 mg Monday/wednesday/friday and 7.5 mg other days.check inr Monday April 29th. Patient taking differently: Take 7.5 mg by mouth daily.  04/26/17   Georgette Shell, MD    Family History Family History  Problem Relation Age of Onset  . Heart disease Father        CAD, died age 50  . Hypertension Mother   . Dementia Mother   . Coronary artery disease Sister 52  . Hypertension Brother     Social History Social History   Tobacco Use  . Smoking status: Former Smoker    Packs/day: 0.00    Years: 53.00    Pack years: 0.00    Types: Cigarettes  . Smokeless tobacco: Never Used  . Tobacco comment: quit smoking cigarettes in January 2019  Substance Use Topics  . Alcohol use: Yes    Alcohol/week: 0.0 oz    Comment: very  rarely per pt  . Drug use: No    Comment: former marijuana use, several years     Allergies   Ace inhibitors; Heparin; Iohexol; Phenytoin; Wellbutrin [bupropion]; Meperidine hcl; Morphine; Penicillins; Valproic acid and related; Iodinated diagnostic agents; and Pentazocine lactate   Review of Systems Review of Systems  Constitutional: Positive for fatigue. Negative for appetite change, chills, diaphoresis and fever.  HENT: Negative for mouth sores, sore throat and trouble swallowing.   Eyes: Negative for visual disturbance.  Respiratory: Negative for cough, chest tightness, shortness of breath and wheezing.   Cardiovascular: Negative for chest pain.  Gastrointestinal: Positive for blood in stool. Negative for abdominal distention, abdominal pain, diarrhea, nausea and vomiting.  Endocrine: Negative for polydipsia, polyphagia and polyuria.  Genitourinary: Negative for dysuria, frequency and hematuria.  Musculoskeletal: Negative for gait problem.  Skin: Negative for color change, pallor and rash.  Neurological: Positive for weakness. Negative for dizziness, syncope, light-headedness and headaches.  Hematological: Does not bruise/bleed easily.  Psychiatric/Behavioral: Negative for behavioral problems and confusion.     Physical Exam Updated Vital Signs Temp (!) 97.3 F (36.3 C) (Axillary)   Ht 5\' 2"  (1.575 m)   Wt 72.6 kg (160 lb)   BMI 29.26 kg/m   Physical Exam  Constitutional: She is oriented to person, place, and time. She appears well-developed and well-nourished. No distress.  HENT:  Head: Normocephalic.  Eyes: Pupils are equal, round, and reactive to light. No scleral icterus.  Conjunctiva not pale  Neck: Normal range of motion. Neck supple. No thyromegaly present.  Cardiovascular: Normal rate and regular rhythm. Exam reveals no gallop and no friction rub.  No murmur heard. Pulmonary/Chest: Effort normal and breath sounds  normal. No respiratory distress. She has no  wheezes. She has no rales.  Abdominal: Soft. Bowel sounds are normal. She exhibits no distension. There is no tenderness. There is no rebound.  Genitourinary:  Genitourinary Comments: Maroon stool. Small external hemorrhoid.  Not inflamed.  Musculoskeletal: Normal range of motion.  Neurological: She is alert and oriented to person, place, and time.  Skin: Skin is warm and dry. No rash noted.  Psychiatric: She has a normal mood and affect. Her behavior is normal.     ED Treatments / Results  Labs (all labs ordered are listed, but only abnormal results are displayed) Labs Reviewed  CBC WITH DIFFERENTIAL/PLATELET - Abnormal; Notable for the following components:      Result Value   RBC 3.71 (*)    Hemoglobin 10.0 (*)    HCT 31.1 (*)    RDW 15.6 (*)    All other components within normal limits  COMPREHENSIVE METABOLIC PANEL - Abnormal; Notable for the following components:   Chloride 94 (*)    BUN 63 (*)    Creatinine, Ser 6.37 (*)    Calcium 8.7 (*)    Albumin 3.1 (*)    GFR calc non Af Amer 6 (*)    GFR calc Af Amer 7 (*)    Anion gap 17 (*)    All other components within normal limits  PROTIME-INR - Abnormal; Notable for the following components:   Prothrombin Time 28.6 (*)    All other components within normal limits  POC OCCULT BLOOD, ED - Abnormal; Notable for the following components:   Fecal Occult Bld POSITIVE (*)    All other components within normal limits  TYPE AND SCREEN    EKG EKG Interpretation  Date/Time:  Thursday June 28 2017 16:43:03 EDT Ventricular Rate:  114 PR Interval:    QRS Duration: 107 QT Interval:  355 QTC Calculation: 489 R Axis:   -150 Text Interpretation:  Atrial fibrillation Abnormal lateral Q waves Anterior infarct, old Confirmed by Tanna Furry [phone]) on 06/28/2017 5:05:38 PM   Radiology No results found.  Procedures Procedures (including critical care time)  Medications Ordered in ED Medications - No data to  display   Initial Impression / Assessment and Plan / ED Course  I have reviewed the triage vital signs and the nursing notes.  Pertinent labs & imaging results that were available during my care of the patient were reviewed by me and considered in my medical decision making (see chart for details).    10.0.  Only down from 11.16 days ago.  However she is anticoagulated.  Symptomatic.  Will discuss with hospitalist and GI regarding admission, correction of any coagulation, consideration for endoscopy, serial hemoglobins.  Final Clinical Impressions(s) / ED Diagnoses   Final diagnoses:  Melena    ED Discharge Orders    None       Tanna Furry, MD 06/28/17 1814    Tanna Furry, MD 06/28/17 [phone]

## 2017-06-28 NOTE — H&P (Addendum)
History and Physical    [name] OBS:[id number] DOB: Apr 13, 1945 DOA: 06/28/2017  Referring MD/NP/PA:   PCP: Glendale Chard, MD   Patient coming from:  The patient is coming from home.  At baseline, pt is independent for most of ADL.   Chief Complaint: Dark and maroon stool  HPI: [name] is a 72 y.o. female with medical history significant of A. fib on Coumadin, ESRD-HD (MWF), hypertension, hyperlipidemia, asthma, stroke, GERD, hypothyroidism, PVD, pulmonary hypertension, OSA not on CPAP, seizure, sCHF with EF of 40%, who presents with dark and maroon stool.  Patient states that  She has been having intermittent dark and maroon stool in the past 6 days.  She developed fatigue, no syncope. She had another episode of dark stool BM with large amount of blood in ED. Patient denies chest pain, shortness of breath.  No cough, fever or chills.  She has nausea, no vomiting or abdominal pain.  Denies symptoms of UTI or unilateral weakness.  Patient states that she had a colonoscopy approximately 2 years ago by Dr. Collene Mares, which was negative. She had last dialysis on Wednesday.  ED Course: pt was found to have hemoglobin dropped from 11.3 on 06/23/17-10.0 today, positive FOBT, potassium 5.1, bicarbonate 26, creatinine 6.37, BUN 63, temperature 97, tachycardia, heart rate 110, oxygen saturation 94% on room air.  Patient is admitted to stepdown as inpatient.  Review of Systems:   General: no fevers, chills, no body weight gain, has poor appetite, has fatigue HEENT: no blurry vision, hearing changes or sore throat Respiratory: no dyspnea, coughing, wheezing CV: no chest pain, no palpitations GI: has nausea, no vomiting, abdominal pain, diarrhea, constipation. Has dark and maroon stool GU: no dysuria, burning on urination, increased urinary frequency, hematuria  Ext: has trace leg edema Neuro: no unilateral weakness, numbness, or tingling, no vision change or hearing loss Skin: no rash, no  skin tear. MSK: No muscle spasm, no deformity, no limitation of range of movement in spin Heme: No easy bruising.  Travel history: No recent long distant travel.  Allergy:  Allergies  Allergen Reactions  . Ace Inhibitors Anaphylaxis and Rash  . Heparin Other (See Comments)    MDs told her not to take after reaction in ICU. Tolerates Lovenox fine  . Iohexol Swelling and Other (See Comments)    1970s; passed out and had facial/tongue swelling.  Requires 13-hour prep with prednisone and benadryl  . Meperidine Hcl Anaphylaxis  . Phenytoin Other (See Comments)    Had reaction while in ICU; doesn't know.  MDs told her not to take ever again.  . Wellbutrin [Bupropion] Other (See Comments)    seizures  . Iodinated Diagnostic Agents Hives, Itching, Rash and Other (See Comments)    Pre-meditate with benadryl and prednisone 3 times before appt.  . Morphine Rash  . Penicillins Hives and Rash    Has patient had a PCN reaction causing immediate rash, facial/tongue/throat swelling, SOB or lightheadedness with hypotension: Yes Has patient had a PCN reaction causing severe rash involving mucus membranes or skin necrosis: No Has patient had a PCN reaction that required hospitalization: No Has patient had a PCN reaction occurring within the last 10 years: No If all of the above answers are "NO", then may proceed with Cephalosporin use.    . Valproic Acid And Related Other (See Comments)    Confusion   . Codeine Itching and Rash  . Pentazocine Lactate Other (See Comments)    Patient does not remember  reaction to this med ([place]).     Past Medical History:  Diagnosis Date  . Anemia    History  of Blood transfusion  . Asthma    PMH  . Atrial fibrillation with RVR ([place]) 11/2016  . CAD (coronary artery disease)    stent to RCA  . Cancer ([place])    clear cell cancer, kidney  . Complication of anesthesia 12/2010   pt is very confused, with AMS with anesthesia  . CVA (cerebral infarction) 2003    no apparent residual  . Dyslipidemia   . Encephalopathy   . ESRD 07/27/2008   ESRD due to HTN and NSAID's, started hemodialysis in 2005 in [place], [place]. Went to [facility] from 2010 to 2012 and since 2012 has been getting dialysis at [facility] on [address] in [place] on a MWF schedule. First access with RUA AVG placed in [place]. Next and current access was LUA AVG placed by Dr. Lucky Cowboy in [place] in or around 2012. Has had 2 or 3 procedures on that graft since placed per family. She gets her access work done here in [place] now. She is allergic to heparin and does not get any heparin at dialysis; she had an allergic reaction apparently when in ICU in the past.      . GERD (gastroesophageal reflux disease)   . Hyperlipidemia   . Hypertension   . Hypothyroidism   . Myocardial infarction ([place])    per pt  . Positive PPD    completed rifampin  . Pulmonary hypertension ([place])   . PVD (peripheral vascular disease) ([place])   . Seizures ([place])    last seizure 6 years ago  . Sleep apnea    Sleep Study 2008  . Traumatic seroma of left lower leg ([place])   . Wears partial dentures     Past Surgical History:  Procedure Laterality Date  . ABDOMINAL AORTOGRAM W/LOWER EXTREMITY Bilateral 06/15/2016   Procedure: Abdominal Aortogram w/Lower Extremity;  Surgeon: Conrad Cokato, MD;  Location: [facility];  Service: Cardiovascular;  Laterality: Bilateral;  . ABDOMINAL HYSTERECTOMY    . APPENDECTOMY    . APPLICATION OF WOUND VAC Left 09/12/2016   Procedure: APPLICATION OF WOUND VAC;  Surgeon: Conrad Glenbeulah, MD;  Location: [facility];  Service: Vascular;  Laterality: Left;  . ARTERY EXPLORATION Left 07/25/2016   Procedure: ARTERY EXPLORATION LEFT ABOVE KNEE POPLITEAL AND GROIN;  Surgeon: Conrad Bluewater, MD;  Location: [facility];  Service: Vascular;  Laterality: Left;  . AV FISTULA PLACEMENT Left 04/10/2017   Procedure: INSERTION OF ARTERIOVENOUS (AV) GORE-TEX GRAFT REDO LEFT UPPER ARM;  Surgeon:  Conrad Woodson, MD;  Location: [facility];  Service: Vascular;  Laterality: Left;  . CARDIAC CATHETERIZATION     last 2016  . CHOLECYSTECTOMY    . COLONOSCOPY    . D&Cs    . HEMATOMA EVACUATION Left 09/12/2016   Procedure: EVACUATION HEMATOMA;  Surgeon: Conrad Wilmore, MD;  Location: [facility];  Service: Vascular;  Laterality: Left;  . INSERTION OF DIALYSIS CATHETER Left 04/17/2017   Procedure: INSERTION OF DIALYSIS CATHETER LEFT GROIN;  Surgeon: Conrad Theresa, MD;  Location: [facility];  Service: Vascular;  Laterality: Left;  . INSERTION OF DIALYSIS CATHETER Right 04/10/2017   Procedure: INSERTION OF DIALYSIS CATHETER;  Surgeon: Conrad Hurt, MD;  Location: [facility];  Service: Vascular;  Laterality: Right;  . IR RADIOLOGIST EVAL & MGMT  03/13/2017  . LEFT HEART CATH AND CORONARY  ANGIOGRAPHY N/A 11/22/2016   Procedure: LEFT HEART CATH AND CORONARY ANGIOGRAPHY;  Surgeon: Belva Crome, MD;  Location: [facility];  Service: Cardiovascular;  Laterality: N/A;  . LEFT HEART CATHETERIZATION WITH CORONARY ANGIOGRAM N/A 02/22/2011   Procedure: LEFT HEART CATHETERIZATION WITH CORONARY ANGIOGRAM;  Surgeon: Wellington Hampshire, MD;  Location: [facility] C[facility];  Service: Cardiovascular;  Laterality: N/A;  . left nephrectomy    . MULTIPLE TOOTH EXTRACTIONS    . NEPHRECTOMY Left    Malignant tumor  . PERIPHERAL VASCULAR CATHETERIZATION N/A 12/31/2014   Procedure: Abdominal Aortogram;  Surgeon: Conrad Lisco, MD;  Location: [facility];  Service: Cardiovascular;  Laterality: N/A;  . right ankle repair    . RIGHT HEART CATHETERIZATION N/A 01/29/2014   Procedure: RIGHT HEART CATH;  Surgeon: Larey Dresser, MD;  Location: [facility];  Service: Cardiovascular;  Laterality: N/A;  . TONSILLECTOMY    . TUBAL LIGATION    . ULTRASOUND GUIDANCE FOR VASCULAR ACCESS  11/22/2016   Procedure: Ultrasound Guidance For Vascular Access;  Surgeon: Belva Crome, MD;  Location: [facility];  Service: Cardiovascular;;  . UPPER  EXTREMITY VENOGRAPHY Bilateral 03/22/2017   Procedure: UPPER EXTREMITY VENOGRAPHY;  Surgeon: Conrad Mount Olivet, MD;  Location: [facility];  Service: Cardiovascular;  Laterality: Bilateral;  bilateral  . VASCULAR SURGERY  11/2010   graft inserted to left arm    Social History:  reports that she has quit smoking. Her smoking use included cigarettes. She smoked 0.00 packs per day for 53.00 years. She has never used smokeless tobacco. She reports that she drinks alcohol. She reports that she does not use drugs.  Family History:  Family History  Problem Relation Age of Onset  . Heart disease Father        CAD, died age 46  . Hypertension Mother   . Dementia Mother   . Coronary artery disease Sister 14  . Hypertension Brother      Prior to Admission medications   Medication Sig Start Date End Date Taking? Authorizing Provider  acetaminophen (TYLENOL) 500 MG tablet Take 1,000 mg by mouth daily as needed for mild pain.    Yes [provider]  albuterol (PROVENTIL HFA;VENTOLIN HFA) 108 (90 Base) MCG/ACT inhaler Inhale 1-2 puffs into the lungs every 6 (six) hours as needed for wheezing or shortness of breath. 01/05/17  Yes Marney Setting, NP  aspirin EC 81 MG EC tablet Take 1 tablet (81 mg total) by mouth daily. 04/20/17  Yes Debbe Odea, MD  calcitRIOL (ROCALTROL) 0.25 MCG capsule Take 1 capsule (0.25 mcg total) by mouth every Monday, Wednesday, and Friday with hemodialysis. 06/25/17  Yes Jani Gravel, MD  camphor-menthol [facility]) lotion Apply topically as needed for itching. Patient taking differently: Apply 1 application topically daily as needed for itching.  04/26/17  Yes Georgette Shell, MD  cilostazol (PLETAL) 100 MG tablet Take 1 tablet (100 mg total) by mouth 2 (two) times daily before a meal. 06/19/17  Yes Conrad Langlade, MD  digoxin (LANOXIN) 0.125 MG tablet Take 1 tablet (0.125 mg total) by mouth 3 (three) times a week. 04/19/17  Yes Debbe Odea, MD  diltiazem (CARDIZEM)  60 MG tablet Take 1 tablet (60 mg total) by mouth 3 (three) times daily. Take BP prior to each dose and Hold if SBP < 90 06/27/17  Yes Hochrein, Jeneen Rinks, MD  diphenhydrAMINE (BENADRYL) 50 MG capsule Take 1 capsule (50 mg total)  by mouth every Monday, Wednesday, and Friday. On dialysis days 09/15/16  Yes Rhyne, Hulen Shouts, PA-C  HYDROcodone-acetaminophen (NORCO) 5-325 MG tablet Take 1 tablet by mouth every 6 (six) hours as needed for moderate pain. 04/10/17  Yes Eveland, Matthew, PA-C  LETAIRIS 5 MG tablet TAKE 1 TABLET (5MG ) BY MOUTH DAILY. DO NOT HANDLE IF PREGNANT. DO NOTSPLIT, CRUSH, OR CHEW. AVOID INHALATION AND CONTACT WITH SKIN OR EYES. 09/18/16  Yes Juanito Doom, MD  levETIRAcetam (KEPPRA) 500 MG tablet Take 500 mg by mouth daily at 8 pm.   Yes [provider]  levothyroxine (SYNTHROID, LEVOTHROID) 150 MCG tablet Take 150 mcg by mouth daily before breakfast.   Yes [provider]  Metoprolol Tartrate 75 MG TABS Take 75 mg by mouth 2 (two) times daily. 06/27/17  Yes Minus Breeding, MD  multivitamin (RENA-VIT) TABS tablet Take 1 tablet by mouth at bedtime. 04/19/17  Yes Debbe Odea, MD  nitroGLYCERIN (NITROSTAT) 0.4 MG SL tablet Place 1 tablet (0.4 mg total) under the tongue every 5 (five) minutes as needed for chest pain. 10/26/16  Yes Minus Breeding, MD  Nutritional Supplements (FEEDING SUPPLEMENT, NEPRO CARB STEADY,) LIQD Take 237 mLs by mouth 2 (two) times daily between meals. 06/23/17  Yes Jani Gravel, MD  omeprazole (PRILOSEC OTC) 20 MG tablet Take 20 mg by mouth daily.   Yes [provider]  polyethylene glycol (MIRALAX / GLYCOLAX) packet Take 17 g by mouth daily as needed for severe constipation. 04/26/17  Yes Georgette Shell, MD  pravastatin (PRAVACHOL) 40 MG tablet Take 1 tablet (40 mg total) by mouth at bedtime. 06/27/17  Yes Minus Breeding, MD  ranolazine (RANEXA) 500 MG 12 hr tablet Take 1 tablet (500 mg total) by mouth 2 (two) times daily. 06/27/17  Yes  Minus Breeding, MD  senna-docusate (SENOKOT-S) 8.6-50 MG tablet Take 2 tablets by mouth at bedtime as needed for mild constipation or moderate constipation. 04/19/17  Yes Debbe Odea, MD  SENSIPAR 90 MG tablet Take 90 mg by mouth every Monday, Wednesday, and Friday with hemodialysis.  03/05/14  Yes [provider]  sevelamer carbonate (RENVELA) 800 MG tablet Take 800-1,600 mg by mouth See admin instructions. Take 1 tablet with snacks and 2 tablets with meals   Yes [provider]  traMADol (ULTRAM) 50 MG tablet Take 50 mg by mouth every 6 (six) hours as needed for moderate pain.   Yes [provider]  traZODone (DESYREL) 50 MG tablet Take 50 mg by mouth at bedtime as needed for sleep.    Yes [provider]  warfarin (COUMADIN) 5 MG tablet Take 10 mg by mouth 2 (two) times a week. On mondays and fridays   Yes [provider]  warfarin (COUMADIN) 7.5 MG tablet Take 7.5 mg by mouth See admin instructions. Takes on Tuesday, Wednesday, Thursday, Saturday & Sunday (takes 10 mg on mondays and fridays)   Yes [provider]  Darbepoetin Alfa (ARANESP) 60 MCG/0.3ML SOSY injection Inject 0.3 mLs (60 mcg total) into the skin every Monday with hemodialysis. 04/26/17   Georgette Shell, MD  patiromer (VELTASSA) 8.4 g packet Take 1 packet (8.4 g total) by mouth daily. Patient not taking: Reported on 06/28/2017 06/23/17   Jani Gravel, MD  warfarin (COUMADIN) 2.5 MG tablet Take 10 mg Monday/wednesday/friday and 7.5 mg other days.check inr Monday April 29th. Patient not taking: Reported on 06/28/2017 04/26/17   Georgette Shell, MD    Physical Exam: Vitals:   06/28/17  1830 06/28/17 1845 06/28/17 1915 06/28/17 2112  BP: 105/75 101/71 108/78 110/74  Pulse: (!) 115 (!) 112 99 (!) 105  Resp: 16 15 17 18   Temp:    97.7 F (36.5 C)  TempSrc:    Oral  SpO2: 100% 94% 97% 100%  Weight:    72.2 kg (159 lb 1.6 oz)  Height:    5\' 4"  (1.626 m)   General: Not in  acute distress HEENT:       Eyes: PERRL, EOMI, no scleral icterus.       ENT: No discharge from the ears and nose, no pharynx injection, no tonsillar enlargement.        Neck: No JVD, no bruit, no mass felt. Heme: No neck lymph node enlargement. Cardiac: S1/S2, RRR, No murmurs, No gallops or rubs. Respiratory: No rales, wheezing, rhonchi or rubs. GI: Soft, nondistended, nontender, no rebound pain, no organomegaly, BS present. GU: No hematuria Ext: has trace leg edema bilaterally. 2+DP/PT pulse bilaterally. Musculoskeletal: No joint deformities, No joint redness or warmth, no limitation of ROM in spin. Skin: No rashes.  Neuro: Alert, oriented X3, cranial nerves II-XII grossly intact, moves all extremities normally Psych: Patient is not psychotic, no suicidal or hemocidal ideation.  Labs on Admission: I have personally reviewed following labs and imaging studies  CBC: Recent Labs  Lab 06/22/17 1535 06/23/17 1111 06/28/17 1650  WBC 3.8* 5.3 4.1  NEUTROABS  --   --  2.2  HGB 10.6* 11.3* 10.0*  HCT 33.6* 36.4 31.1*  MCV 86.4 87.3 83.8  PLT 131* 97* 779   Basic Metabolic Panel: Recent Labs  Lab 06/22/17 1514 06/28/17 1650  NA 137 137  K 3.7 5.1  CL 104 94*  CO2 23 26  GLUCOSE 99 70  BUN 26* 63*  CREATININE 4.31* 6.37*  CALCIUM 7.2* 8.7*  PHOS 3.9  --    GFR: Estimated Creatinine Clearance: 7.7 mL/min (A) (by C-G formula based on SCr of 6.37 mg/dL (H)). Liver Function Tests: Recent Labs  Lab 06/22/17 1514 06/28/17 1650  AST  --  37  ALT  --  26  ALKPHOS  --  76  BILITOT  --  1.0  PROT  --  6.6  ALBUMIN 2.4* 3.1*   No results for input(s): LIPASE, AMYLASE in the last 168 hours. No results for input(s): AMMONIA in the last 168 hours. Coagulation Profile: Recent Labs  Lab 06/22/17 1535 06/23/17 0532 06/26/17 1145 06/28/17 1650  INR 1.88 1.81 2.1 2.71   Cardiac Enzymes: No results for input(s): CKTOTAL, CKMB, CKMBINDEX, TROPONINI in the last 168  hours. BNP (last 3 results) No results for input(s): PROBNP in the last 8760 hours. HbA1C: No results for input(s): HGBA1C in the last 72 hours. CBG: No results for input(s): GLUCAP in the last 168 hours. Lipid Profile: No results for input(s): CHOL, HDL, LDLCALC, TRIG, CHOLHDL, LDLDIRECT in the last 72 hours. Thyroid Function Tests: No results for input(s): TSH, T4TOTAL, FREET4, T3FREE, THYROIDAB in the last 72 hours. Anemia Panel: No results for input(s): VITAMINB12, FOLATE, FERRITIN, TIBC, IRON, RETICCTPCT in the last 72 hours. Urine analysis:    Component Value Date/Time   COLORURINE YELLOW 09/03/2014 0018   APPEARANCEUR CLOUDY (A) 09/03/2014 0018   LABSPEC 1.010 09/03/2014 0018   PHURINE 5.5 09/03/2014 0018   GLUCOSEU 250 (A) 09/03/2014 0018   HGBUR TRACE (A) 09/03/2014 0018   BILIRUBINUR NEGATIVE 09/03/2014 0018   KETONESUR NEGATIVE 09/03/2014 0018   PROTEINUR 100 (A) 09/03/2014 0018  UROBILINOGEN 0.2 09/03/2014 0018   NITRITE NEGATIVE 09/03/2014 0018   LEUKOCYTESUR NEGATIVE 09/03/2014 0018   Sepsis Labs: @LABRCNTIP (procalcitonin:4,lacticidven:4) ) Recent Results (from the past 240 hour(s))  MRSA PCR Screening     Status: None   Collection Time: 06/21/17  1:18 AM  Result Value Ref Range Status   MRSA by PCR NEGATIVE NEGATIVE Final    Comment:        The GeneXpert MRSA Assay (FDA approved for NASAL specimens only), is one component of a comprehensive MRSA colonization surveillance program. It is not intended to diagnose MRSA infection nor to guide or monitor treatment for MRSA infections. Performed at [facility], [place] [address], [place], [place] [address]      Radiological Exams on Admission: No results found.   EKG: Independently reviewed.  Atrial fibrillation, QTc 489, low voltage, poor R wave progression, anteroseptal infarction pattern.  Assessment/Plan Principal Problem:   GIB (gastrointestinal bleeding) Active Problems:   CAD (coronary  artery disease)   History of CVA (cerebrovascular accident)   PVD   HTN (hypertension)   Pulmonary hypertension (HCC)   Atrial fibrillation with RVR ([facility])   ESRD on dialysis ([place])   Anemia in ESRD (end-stage renal disease) ([place])   HLD (hyperlipidemia)  GIB (gastrointestinal bleeding): in the setting of coumadin use. INR 2.71. Had negative colonoscopy by Dr. Collene Mares 2 years ago per patient.  Patient has fatigue, but hemodynamically stable.  No chest pain or shortness breath.  - will admit to tele bed as inpt - hold coumadin and ASA - transfuse 1 unit of FFP - 10 mg of IV Vk - NPO  - will not give IVF since pt is ESRD and hemodynamically stable - Start IV pantoprazole 40 mg bid - Zofran IV for nausea - Avoid NSAIDs and SQ heparin - Maintain IV access (2 large bore IVs if possible). - Monitor closely and follow q6h cbc, transfuse as necessary, if Hgb<7.0 - LaB: type screen  Addendum:  Pt patient has deteriorated after arrival to the floor.  Blood pressure dropped to SBP 70-80s. She had three more bloody bowel movements per nurse.  -give one dose of K-centra -tranfuse 2U of blood. -1L of NS bolus -PCCM was consulted. Since pt responded to the IV fluid resuscitation and blood transfusion, blood pressure comes back to SBP 90s. Repeated Hgb 8.7, therefore PCCM is watching pt. -GI, Dr. Ronalee Red was consulted. He highly suspects patient may have diverticular bleeding, but wants to r/o any possibility of upper GI bleeding.  He recommended to put NG tube in and get some gastric material for testing for blood. I ordered both. Dr. Benson Norway also recommended to get CT angiogram of abdomen/pelvis.  Patient is allergic to dye.  I will pretreat patient with 200 mg of Solu-Cortef at 5:00, and 50 mg of Benadryl at 8:00, plan to do CT angiogram at 9:00.   Anemia in ESRD (end-stage renal disease) ([place]): worsened by blood loss from GIB. -see above  History of CVA (cerebrovascular accident): -hold ASA and  coumadin  PVD: -Cilostazol  HTN:  -Continue home medications: Cardizem, metoprolol -IV hydralazine prn  Pulmonary hypertension ([place]): -Letairis  Atrial Fibrillation: CHA2DS2-VASc Score is 7, needs oral anticoagulation. Patient is on Coumadin at home. INR is 2.71 on admission. Heart rate is 110s. -hold coumadin due to GIB -Continue metoprolol    ESRD on dialysis (MWF):  potassium 5.1, bicarbonate 26, creatinine 6.37, BUN 63. -please call renal for HD in AM.  CAD: no CP.  -  prn NTG -continue metoprolol and Ranexa -Hold aspirin  HLD: -Pravastatin   DVT ppx: SCD Code Status: Partial code (I discussed with the patient in the presence of daughter, and explained the meaning of CODE STATUS, patient wants to be partial code, OK for CPR, but no intubation). Family Communication: Yes, patient's  daughter at bed side Disposition Plan:  Anticipate discharge back to previous home environment Consults called:  none Admission status: SDU/inpation       Date of Service 06/28/2017    Ivor Costa [facility] Pager [phone]  If 7PM-7AM, please contact night-coverage [email or web address] Password [email or web address] 06/28/2017, 10:40 PM

## 2017-06-29 ENCOUNTER — Encounter (HOSPITAL_COMMUNITY)
Admission: EM | Disposition: A | Discharge status: Hospice | Payer: Self-pay | Source: Home / Self Care | Attending: Internal Medicine

## 2017-06-29 DIAGNOSIS — I4891 Unspecified atrial fibrillation: Secondary | ICD-10-CM

## 2017-06-29 DIAGNOSIS — N186 End stage renal disease: Secondary | ICD-10-CM

## 2017-06-29 DIAGNOSIS — K921 Melena: Principal | ICD-10-CM

## 2017-06-29 DIAGNOSIS — Z992 Dependence on renal dialysis: Secondary | ICD-10-CM

## 2017-06-29 LAB — CBC WITH DIFFERENTIAL/PLATELET
ABS IMMATURE GRANULOCYTES: 0 10*3/uL (ref 0.0–0.1)
Basophils Absolute: 0 10*3/uL (ref 0.0–0.1)
Basophils Relative: 1 %
Eosinophils Absolute: 0.1 10*3/uL (ref 0.0–0.7)
Eosinophils Relative: 1 %
HCT: 38 % (ref 36.0–46.0)
Hemoglobin: 12.6 g/dL (ref 12.0–15.0)
IMMATURE GRANULOCYTES: 1 %
LYMPHS ABS: 0.9 10*3/uL (ref 0.7–4.0)
LYMPHS PCT: 15 %
MCH: 27.5 pg (ref 26.0–34.0)
MCHC: 33.2 g/dL (ref 30.0–36.0)
MCV: 83 fL (ref 78.0–100.0)
MONOS PCT: 7 %
Monocytes Absolute: 0.4 10*3/uL (ref 0.1–1.0)
NEUTROS ABS: 4.7 10*3/uL (ref 1.7–7.7)
Neutrophils Relative %: 75 %
Platelets: 124 10*3/uL — ABNORMAL LOW (ref 150–400)
RBC: 4.58 MIL/uL (ref 3.87–5.11)
RDW: 15.3 % (ref 11.5–15.5)
WBC: 6.2 10*3/uL (ref 4.0–10.5)

## 2017-06-29 LAB — CBC
HCT: 26.9 % — ABNORMAL LOW (ref 36.0–46.0)
Hemoglobin: 8.7 g/dL — ABNORMAL LOW (ref 12.0–15.0)
MCH: 27.1 pg (ref 26.0–34.0)
MCHC: 32.3 g/dL (ref 30.0–36.0)
MCV: 83.8 fL (ref 78.0–100.0)
Platelets: 110 10*3/uL — ABNORMAL LOW (ref 150–400)
RBC: 3.21 MIL/uL — ABNORMAL LOW (ref 3.87–5.11)
RDW: 15.6 % — ABNORMAL HIGH (ref 11.5–15.5)
WBC: 3.6 10*3/uL — ABNORMAL LOW (ref 4.0–10.5)

## 2017-06-29 LAB — CORTISOL-AM, BLOOD

## 2017-06-29 LAB — PROTIME-INR
INR: 1.24
PROTHROMBIN TIME: 15.5 s — AB (ref 11.4–15.2)

## 2017-06-29 LAB — PREPARE RBC (CROSSMATCH)

## 2017-06-29 SURGERY — ESOPHAGOGASTRODUODENOSCOPY (EGD) WITH PROPOFOL
Anesthesia: Monitor Anesthesia Care

## 2017-06-29 MED ORDER — SODIUM CHLORIDE 0.9% IV SOLUTION
Freq: Once | INTRAVENOUS | Status: AC
  Administered 2017-06-29: 05:00:00 via INTRAVENOUS

## 2017-06-29 MED ORDER — SODIUM CHLORIDE 0.9 % IV BOLUS
1000.0000 mL | Freq: Once | INTRAVENOUS | Status: AC
  Administered 2017-06-29: 1000 mL via INTRAVENOUS

## 2017-06-29 MED ORDER — PROTHROMBIN COMPLEX CONC HUMAN 500 UNITS IV KIT
3770.0000 [IU] | PACK | Status: AC
  Administered 2017-06-29: 3770 [IU] via INTRAVENOUS
  Filled 2017-06-29: qty 3270

## 2017-06-29 MED ORDER — SODIUM CHLORIDE 0.9 % IV SOLN
50.0000 mg | Freq: Once | INTRAVENOUS | Status: AC
  Administered 2017-06-29: 50 mg via INTRAVENOUS
  Filled 2017-06-29: qty 1

## 2017-06-29 MED ORDER — DIPHENHYDRAMINE HCL 50 MG/ML IJ SOLN
25.0000 mg | Freq: Once | INTRAMUSCULAR | Status: AC
  Administered 2017-06-29: 25 mg via INTRAVENOUS
  Filled 2017-06-29: qty 1

## 2017-06-29 MED ORDER — SODIUM CHLORIDE 0.9% IV SOLUTION: Freq: Once | INTRAVENOUS | Status: DC

## 2017-06-29 MED ORDER — HYDROCORTISONE NA SUCCINATE PF 250 MG IJ SOLR
200.0000 mg | Freq: Once | INTRAMUSCULAR | Status: AC
  Administered 2017-06-29: 200 mg via INTRAVENOUS
  Filled 2017-06-29: qty 200

## 2017-06-29 NOTE — Progress Notes (Signed)
Pt resting in bed comfortably. Pt had 120ml of FFP and kcentra. Pt currently getting blood transfused. Pt shows no signs of fluid overload. Pt's b/p still running from 35-59'R systolic. Pt received 767mL of fluid bolus prior to blood transfusion. Bolus cut off per Dr. Edgar Frisk orders. Will continue to monitor the pt and transfuse the next unit of blood after current unit is completed.

## 2017-06-29 NOTE — Progress Notes (Signed)
[facility] RN note.  Received request from [name], [place] for family interest in [facility]. Chart reviewed and spoke with daughters, [name] and Denice to acknowledge referral. Unfortunately [facility] is not able to offer a room today. Family and CSW are aware [facility] liaison will follow up with CSW and family tomorrow or sooner if room becomes available.   Please do not hesitate to call with questions.  Thank you,   Farrel Gordon, RN, [facility] Liaison  [place] are on AMION

## 2017-06-29 NOTE — Progress Notes (Signed)
I was just informed that the patient is refusing all care and desires Hospice care.  The EGD will be cancelled.

## 2017-06-29 NOTE — Consult Note (Signed)
   [facility] CM Inpatient Consult   06/29/2017  [name] 06/30/1945 [id number]    Patient screened for potential [facility] Care Management program services due multiple hospitalizations.   Spoke with inpatient LCSW. Disposition plan is for residential hospice facility.   No identifiable [facility] Care Management needs.   Marthenia Rolling, MSN-Ed, RN,BSN [facility] Liaison [phone]

## 2017-06-29 NOTE — Consult Note (Signed)
Renal Service Consult Note [name]  [name] 06/29/2017 Sol Blazing Requesting Physician:  Dr Erlinda Hong  Reason for Consult:  ESRD pt w/ GI bleed HPI: The patient is a 72 y.o. year-old with hx of HTN asthma CVA CHF ESRD on HD presented to ED w/ c/o maroon stools yesterday evening.  In the ED she had 3 maroon BMs and HB 11.3 initial then f/u was 10.0.  BP's were low and she rec'd saline bolus overnight.  She got 2u prbc's.  Asked to see for dialysis.   Talking w/ the patient she is saying that she is "tired", "I don't want any more procedures", " I don't want anymore dialysis", "I've had 13 years of dialysis, I don't want any more".  She knows the consequences of not doing dialysis.  The daughter arrived and listened to the patient state these things again.  The daughter is supportive of pt's decision to stop dialysis.   We discussed briefly expected EOL scenario when HD is withdrawn.  Pt would like to go to [facility].  Questions answered.     ROS  denies CP  no joint pain   no HA  no blurry vision  no rash  no diarrhea  no nausea/ vomiting  no dysuria  no difficulty voiding  no change in urine color    Past Medical History  Past Medical History:  Diagnosis Date  . Anemia    History  of Blood transfusion  . Asthma    PMH  . Atrial fibrillation with RVR ([place]) 11/2016  . CAD (coronary artery disease)    stent to RCA  . Cancer ([place])    clear cell cancer, kidney  . Complication of anesthesia 12/2010   pt is very confused, with AMS with anesthesia  . CVA (cerebral infarction) 2003   no apparent residual  . Dyslipidemia   . Encephalopathy   . ESRD 07/27/2008   ESRD due to HTN and NSAID's, started hemodialysis in 2005 in [place], [place]. Went to [facility] from 2010 to 2012 and since 2012 has been getting dialysis at [facility] on [address] in [place] on a MWF schedule. First access with RUA AVG placed in [place]. Next and current access  was LUA AVG placed by Dr. Lucky Cowboy in [place] in or around 2012. Has had 2 or 3 procedures on that graft since placed per family. She gets her access work done here in [place] now. She is allergic to heparin and does not get any heparin at dialysis; she had an allergic reaction apparently when in ICU in the past.      . GERD (gastroesophageal reflux disease)   . Hyperlipidemia   . Hypertension   . Hypothyroidism   . Myocardial infarction ([place])    per pt  . Positive PPD    completed rifampin  . Pulmonary hypertension ([place])   . PVD (peripheral vascular disease) ([place])   . Seizures ([place])    last seizure 6 years ago  . Sleep apnea    Sleep Study 2008  . Traumatic seroma of left lower leg ([place])   . Wears partial dentures    Past Surgical History  Past Surgical History:  Procedure Laterality Date  . ABDOMINAL AORTOGRAM W/LOWER EXTREMITY Bilateral 06/15/2016   Procedure: Abdominal Aortogram w/Lower Extremity;  Surgeon: Conrad [place], MD;  Location: [place] CV LAB;  Service: Cardiovascular;  Laterality: Bilateral;  . ABDOMINAL HYSTERECTOMY    . APPENDECTOMY    .  APPLICATION OF WOUND VAC Left 09/12/2016   Procedure: APPLICATION OF WOUND VAC;  Surgeon: Conrad Allgood, MD;  Location: [place];  Service: Vascular;  Laterality: Left;  . ARTERY EXPLORATION Left 07/25/2016   Procedure: ARTERY EXPLORATION LEFT ABOVE KNEE POPLITEAL AND GROIN;  Surgeon: Conrad [place], MD;  Location: [place];  Service: Vascular;  Laterality: Left;  . AV FISTULA PLACEMENT Left 04/10/2017   Procedure: INSERTION OF ARTERIOVENOUS (AV) GORE-TEX GRAFT REDO LEFT UPPER ARM;  Surgeon: Conrad Vowinckel, MD;  Location: [place];  Service: Vascular;  Laterality: Left;  . CARDIAC CATHETERIZATION     last 2016  . CHOLECYSTECTOMY    . COLONOSCOPY    . D&Cs    . HEMATOMA EVACUATION Left 09/12/2016   Procedure: EVACUATION HEMATOMA;  Surgeon: Conrad Brewster, MD;  Location: [place];  Service: Vascular;  Laterality: Left;  . INSERTION OF DIALYSIS  CATHETER Left 04/17/2017   Procedure: INSERTION OF DIALYSIS CATHETER LEFT GROIN;  Surgeon: Conrad [place], MD;  Location: [place];  Service: Vascular;  Laterality: Left;  . INSERTION OF DIALYSIS CATHETER Right 04/10/2017   Procedure: INSERTION OF DIALYSIS CATHETER;  Surgeon: Conrad La [place], MD;  Location: [facility] OR;  Service: Vascular;  Laterality: Right;  . IR RADIOLOGIST EVAL & MGMT  03/13/2017  . LEFT HEART CATH AND CORONARY ANGIOGRAPHY N/A 11/22/2016   Procedure: LEFT HEART CATH AND CORONARY ANGIOGRAPHY;  Surgeon: Belva Crome, MD;  Location: [place] CV LAB;  Service: Cardiovascular;  Laterality: N/A;  . LEFT HEART CATHETERIZATION WITH CORONARY ANGIOGRAM N/A 02/22/2011   Procedure: LEFT HEART CATHETERIZATION WITH CORONARY ANGIOGRAM;  Surgeon: Wellington Hampshire, MD;  Location: [place] CATH LAB;  Service: Cardiovascular;  Laterality: N/A;  . left nephrectomy    . MULTIPLE TOOTH EXTRACTIONS    . NEPHRECTOMY Left    Malignant tumor  . PERIPHERAL VASCULAR CATHETERIZATION N/A 12/31/2014   Procedure: Abdominal Aortogram;  Surgeon: Conrad [place], MD;  Location: [place] CV LAB;  Service: Cardiovascular;  Laterality: N/A;  . right ankle repair    . RIGHT HEART CATHETERIZATION N/A 01/29/2014   Procedure: RIGHT HEART CATH;  Surgeon: Larey Dresser, MD;  Location: [facility] CATH LAB;  Service: Cardiovascular;  Laterality: N/A;  . TONSILLECTOMY    . TUBAL LIGATION    . ULTRASOUND GUIDANCE FOR VASCULAR ACCESS  11/22/2016   Procedure: Ultrasound Guidance For Vascular Access;  Surgeon: Belva Crome, MD;  Location: [place] CV LAB;  Service: Cardiovascular;;  . UPPER EXTREMITY VENOGRAPHY Bilateral 03/22/2017   Procedure: UPPER EXTREMITY VENOGRAPHY;  Surgeon: Conrad , MD;  Location: [place] CV LAB;  Service: Cardiovascular;  Laterality: Bilateral;  bilateral  . VASCULAR SURGERY  11/2010   graft inserted to left arm   Family History  Family History  Problem Relation Age of Onset  . Heart disease Father         CAD, died age 37  . Hypertension Mother   . Dementia Mother   . Coronary artery disease Sister 76  . Hypertension Brother    Social History  reports that she has quit smoking. Her smoking use included cigarettes. She smoked 0.00 packs per day for 53.00 years. She has never used smokeless tobacco. She reports that she drinks alcohol. She reports that she does not use drugs. Allergies  Allergies  Allergen Reactions  . Ace Inhibitors Anaphylaxis and Rash  . Heparin Other (See Comments)    MDs told her not to take  after reaction in ICU. Tolerates Lovenox fine  . Iohexol Swelling and Other (See Comments)    1970s; passed out and had facial/tongue swelling.  Requires 13-hour prep with prednisone and benadryl  . Meperidine Hcl Anaphylaxis  . Phenytoin Other (See Comments)    Had reaction while in ICU; doesn't know.  MDs told her not to take ever again.  . Wellbutrin [Bupropion] Other (See Comments)    seizures  . Iodinated Diagnostic Agents Hives, Itching, Rash and Other (See Comments)    Pre-meditate with benadryl and prednisone 3 times before appt.  . Morphine Rash  . Penicillins Hives and Rash    Has patient had a PCN reaction causing immediate rash, facial/tongue/throat swelling, SOB or lightheadedness with hypotension: Yes Has patient had a PCN reaction causing severe rash involving mucus membranes or skin necrosis: No Has patient had a PCN reaction that required hospitalization: No Has patient had a PCN reaction occurring within the last 10 years: No If all of the above answers are "NO", then may proceed with Cephalosporin use.    . Valproic Acid And Related Other (See Comments)    Confusion   . Codeine Itching and Rash  . Pentazocine Lactate Other (See Comments)    Patient does not remember reaction to this med ([place]).    Home medications Prior to Admission medications   Medication Sig Start Date End Date Taking? Authorizing Provider  acetaminophen (TYLENOL) 500 MG  tablet Take 1,000 mg by mouth daily as needed for mild pain.    Yes [provider]  albuterol (PROVENTIL HFA;VENTOLIN HFA) 108 (90 Base) MCG/ACT inhaler Inhale 1-2 puffs into the lungs every 6 (six) hours as needed for wheezing or shortness of breath. 01/05/17  Yes Marney Setting, NP  aspirin EC 81 MG EC tablet Take 1 tablet (81 mg total) by mouth daily. 04/20/17  Yes Debbe Odea, MD  calcitRIOL (ROCALTROL) 0.25 MCG capsule Take 1 capsule (0.25 mcg total) by mouth every Monday, Wednesday, and Friday with hemodialysis. 06/25/17  Yes Jani Gravel, MD  camphor-menthol [facility]) lotion Apply topically as needed for itching. Patient taking differently: Apply 1 application topically daily as needed for itching.  04/26/17  Yes Georgette Shell, MD  cilostazol (PLETAL) 100 MG tablet Take 1 tablet (100 mg total) by mouth 2 (two) times daily before a meal. 06/19/17  Yes Conrad [place]omonie, MD  digoxin (LANOXIN) 0.125 MG tablet Take 1 tablet (0.125 mg total) by mouth 3 (three) times a week. 04/19/17  Yes Debbe Odea, MD  diltiazem (CARDIZEM) 60 MG tablet Take 1 tablet (60 mg total) by mouth 3 (three) times daily. Take BP prior to each dose and Hold if SBP < 90 06/27/17  Yes Minus Breeding, MD  diphenhydrAMINE (BENADRYL) 50 MG capsule Take 1 capsule (50 mg total) by mouth every Monday, Wednesday, and Friday. On dialysis days 09/15/16  Yes Rhyne, Hulen Shouts, PA-C  HYDROcodone-acetaminophen (NORCO) 5-325 MG tablet Take 1 tablet by mouth every 6 (six) hours as needed for moderate pain. 04/10/17  Yes Eveland, Matthew, PA-C  LETAIRIS 5 MG tablet TAKE 1 TABLET (5MG ) BY MOUTH DAILY. DO NOT HANDLE IF PREGNANT. DO NOTSPLIT, CRUSH, OR CHEW. AVOID INHALATION AND CONTACT WITH SKIN OR EYES. 09/18/16  Yes Juanito Doom, MD  levETIRAcetam (KEPPRA) 500 MG tablet Take 500 mg by mouth daily at 8 pm.   Yes [provider]  levothyroxine (SYNTHROID, LEVOTHROID) 150 MCG tablet Take 150 mcg by mouth daily before  breakfast.  Yes [provider]  Metoprolol Tartrate 75 MG TABS Take 75 mg by mouth 2 (two) times daily. 06/27/17  Yes Minus Breeding, MD  multivitamin (RENA-VIT) TABS tablet Take 1 tablet by mouth at bedtime. 04/19/17  Yes Debbe Odea, MD  nitroGLYCERIN (NITROSTAT) 0.4 MG SL tablet Place 1 tablet (0.4 mg total) under the tongue every 5 (five) minutes as needed for chest pain. 10/26/16  Yes Minus Breeding, MD  Nutritional Supplements (FEEDING SUPPLEMENT, NEPRO CARB STEADY,) LIQD Take 237 mLs by mouth 2 (two) times daily between meals. 06/23/17  Yes Jani Gravel, MD  omeprazole (PRILOSEC OTC) 20 MG tablet Take 20 mg by mouth daily.   Yes [provider]  polyethylene glycol (MIRALAX / GLYCOLAX) packet Take 17 g by mouth daily as needed for severe constipation. 04/26/17  Yes Georgette Shell, MD  pravastatin (PRAVACHOL) 40 MG tablet Take 1 tablet (40 mg total) by mouth at bedtime. 06/27/17  Yes Minus Breeding, MD  ranolazine (RANEXA) 500 MG 12 hr tablet Take 1 tablet (500 mg total) by mouth 2 (two) times daily. 06/27/17  Yes Minus Breeding, MD  senna-docusate (SENOKOT-S) 8.6-50 MG tablet Take 2 tablets by mouth at bedtime as needed for mild constipation or moderate constipation. 04/19/17  Yes Debbe Odea, MD  SENSIPAR 90 MG tablet Take 90 mg by mouth every Monday, Wednesday, and Friday with hemodialysis.  03/05/14  Yes [provider]  sevelamer carbonate (RENVELA) 800 MG tablet Take 800-1,600 mg by mouth See admin instructions. Take 1 tablet with snacks and 2 tablets with meals   Yes [provider]  traMADol (ULTRAM) 50 MG tablet Take 50 mg by mouth every 6 (six) hours as needed for moderate pain.   Yes [provider]  traZODone (DESYREL) 50 MG tablet Take 50 mg by mouth at bedtime as needed for sleep.    Yes [provider]  warfarin (COUMADIN) 5 MG tablet Take 10 mg by mouth 2 (two) times a week. On mondays and fridays   Yes [provider]  warfarin (COUMADIN) 7.5 MG tablet Take 7.5 mg by mouth See admin instructions. Takes on Tuesday, Wednesday, Thursday, Saturday & Sunday (takes 10 mg on mondays and fridays)   Yes [provider]  Darbepoetin Alfa (ARANESP) 60 MCG/0.3ML SOSY injection Inject 0.3 mLs (60 mcg total) into the skin every Monday with hemodialysis. 04/26/17   Georgette Shell, MD  patiromer (VELTASSA) 8.4 g packet Take 1 packet (8.4 g total) by mouth daily. Patient not taking: Reported on 06/28/2017 06/23/17   Jani Gravel, MD  warfarin (COUMADIN) 2.5 MG tablet Take 10 mg Monday/wednesday/friday and 7.5 mg other days.check inr Monday April 29th. Patient not taking: Reported on 06/28/2017 04/26/17   Georgette Shell, MD   Liver Function Tests Recent Labs  Lab 06/22/17 1514 06/28/17 1650  AST  --  37  ALT  --  26  ALKPHOS  --  76  BILITOT  --  1.0  PROT  --  6.6  ALBUMIN 2.4* 3.1*   No results for input(s): LIPASE, AMYLASE in the last 168 hours. CBC Recent Labs  Lab 06/28/17 1650  06/29/17 0223 06/29/17 0825 06/29/17 0857  WBC 4.1   < > 3.6* 6.2 DUPLICATE  NEUTROABS 2.2  --   --  4.7 PENDING  HGB 10.0*   < > 8.7* 94.4 DUPLICATE  HCT 96.7*   < > 59.1* 63.8 DUPLICATE  MCV 46.6   < > 59.9 35.7 DUPLICATE  PLT  158   < > 364* 383* DUPLICATE   < > = values in this interval not displayed.   Basic Metabolic Panel Recent Labs  Lab 06/22/17 1514 06/28/17 1650  NA 137 137  K 3.7 5.1  CL 104 94*  CO2 23 26  GLUCOSE 99 70  BUN 26* 63*  CREATININE 4.31* 6.37*  CALCIUM 7.2* 8.7*  PHOS 3.9  --    Iron/TIBC/Ferritin/ %Sat    Component Value Date/Time   IRON 38 04/15/2017 1031   TIBC 276 04/15/2017 1031   FERRITIN 1189 (H) 09/01/2009 1711   IRONPCTSAT 14 04/15/2017 1031    Vitals:   06/29/17 0605 06/29/17 0620 06/29/17 0626 06/29/17 0703  BP: 124/83 114/89  114/71  Pulse:      Resp:    16  Temp:    (!) 97.5 F (36.4 C)  TempSrc:    Oral  SpO2:      Weight:   73.9 kg  (163 lb)   Height:       Exam Gen alert pleasant no distress chron ill appearing No rash, cyanosis or gangrene Sclera anicteric, throat clear  No jvd or bruits Chest clear bilat RRR no MRG Abd soft ntnd no mass or ascites +bs GU defer MS no joint effusions or deformity Ext no LE edema, no wounds or ulcers Neuro is alert, Ox 3 , nf     Impression: 1  GIB/ melena 2  ESRD - pt is requesting withdrawal of HD and transition to hospice care at outpt hospice facility.  Will consult SW.   3  Afib - on coumadin 4  Hx CVA 5  Systolic CHF   Plan - as above  Kelly Splinter MD [facility] pager [phone]   06/29/2017, 10:36 AM

## 2017-06-29 NOTE — Clinical Social Work Note (Signed)
Clinical Social Work Assessment  Patient Details  Name: [name] MRN: [id number] Date of Birth: 04-01-1945  Date of referral:  06/29/17               Reason for consult:  Facility Placement, Discharge Planning, End of Life/Hospice                Permission sought to share information with:  Facility Sport and exercise psychologist, Family Supports Permission granted to share information::  Yes, Verbal Permission Granted(patient lethargic/sleeping; daughter at bedside)  Name::     [name]  Agency::  [employer]  Relationship::  daughter  Contact Information:  [phone]  Housing/Transportation Living arrangements for the past 2 months:  Apartment Source of Information:  Adult Children Patient Interpreter Needed:  None Criminal Activity/Legal Involvement Pertinent to Current Situation/Hospitalization:  No - Comment as needed Significant Relationships:  Adult Children, Other Family Members Lives with:  Self Do you feel safe going back to the place where you live?  Yes Need for family participation in patient care:  Yes (Comment)  Care giving concerns: Patient has decided to stop dialysis and transition to hospice care. Patient prefers residential hospice at [facility].   Social Worker assessment / plan: CSW met with patient and daughter at bedside. Patient initially greeted CSW but was then lethargic and dozing off through conversation. Daughter, [name], confirmed patient has decided to stop dialysis and would like to go to hospice at [facility]. Daughter indicated if [facility] is not available, patient would prefer to go home with hospice.   CSW called referral to [facility] at [facility]. [facility] does not have any beds today, but will follow up with CSW tomorrow for bed availability. CSW will follow and support with disposition planning pending if bed opens up at [facility].  Employment status:  Retired Forensic scientist:  Medicare PT Recommendations:  Not assessed at this  time [place] / Referral to community resources:  Other (Comment Required)(residential hospice)  Patient/Family's Response to care: Daughter appreciative of care.  Patient/Family's Understanding of and Emotional Response to Diagnosis, Current Treatment, and Prognosis: Patient and daughter with understanding of patient's prognosis given decision to stop dialysis. Patient agreeable to hospice.  Emotional Assessment Appearance:  Appears stated age Attitude/Demeanor/Rapport:  Lethargic Affect (typically observed):  Unable to Assess Orientation:  Oriented to Self, Oriented to Place, Oriented to  Time, Oriented to Situation Alcohol / Substance use:  Not Applicable Psych involvement (Current and /or in the community):  No (Comment)  Discharge Needs  Concerns to be addressed:  Care Coordination, Discharge Planning Concerns, Other (Comment Required(end of life) Readmission within the last 30 days:  Yes Current discharge risk:  Physical Impairment Barriers to Discharge:  Continued Medical Work up, Hospice Bed not available   Sarah Emms, LCSW 06/29/2017, 12:36 PM

## 2017-06-29 NOTE — Consult Note (Signed)
Reason for Consult: Anemia, melena, maroon stool Referring Physician: [facility].  [name] HPI:  This is a 72 year old female with a PMH of afib on coumadin, recently discharged from the hospital, ESRD, HTN, asthma, CVA, ESRD, GERD, pulm HTN, OSA, and CHF sent to the ER for rectal bleeding in the form of maroon stools and dark stools.  The patient was recently discharged from the [facility] on 06/20/2017 for afib with RVR.  Per the notes, the patient reported hematochezia at that time, but she was discharged home.  She appears unwilling to provide a history at this time.  The majority of the information was gleaned from the chart.  In the ER there was witnessed maroon stool.  Her HGB droppped from 11.3 down to 10 g/dL on admission, but overnight her blood pressure dropped down to the 80's for her SBP.  There are no reports of nausea or vomiting.  Fluid resuscitation and blood transfusion has helped to improve her SBP.  CCM was made aware of the patient.  She states that Dr. Collene Mares performed a colonoscopy two years ago, however, that information is incorrect.  She had her last colonoscopy in [place], [place] 10+ years ago.  Dr. Collene Mares performed an EGD in 2012 to further work up abdominal pain and the possibility of PUD, which was a normal examination.  A CT scan in 2011 was significant for distal diverticula.   Past Medical History:  Diagnosis Date  . Anemia    History  of Blood transfusion  . Asthma    PMH  . Atrial fibrillation with RVR ([place]) 11/2016  . CAD (coronary artery disease)    stent to RCA  . Cancer ([place])    clear cell cancer, kidney  . Complication of anesthesia 12/2010   pt is very confused, with AMS with anesthesia  . CVA (cerebral infarction) 2003   no apparent residual  . Dyslipidemia   . Encephalopathy   . ESRD 07/27/2008   ESRD due to HTN and NSAID's, started hemodialysis in 2005 in [place], [place]. Went to [facility] from 2010 to 2012 and since 2012 has been  getting dialysis at [facility] on [address] in [place] on a MWF schedule. First access with RUA AVG placed in [place]. Next and current access was LUA AVG placed by Dr. Lucky Cowboy in [place] in or around 2012. Has had 2 or 3 procedures on that graft since placed per family. She gets her access work done here in [place] now. She is allergic to heparin and does not get any heparin at dialysis; she had an allergic reaction apparently when in ICU in the past.      . GERD (gastroesophageal reflux disease)   . Hyperlipidemia   . Hypertension   . Hypothyroidism   . Myocardial infarction ([place])    per pt  . Positive PPD    completed rifampin  . Pulmonary hypertension ([place])   . PVD (peripheral vascular disease) ([place])   . Seizures ([place])    last seizure 6 years ago  . Sleep apnea    Sleep Study 2008  . Traumatic seroma of left lower leg ([place])   . Wears partial dentures     Past Surgical History:  Procedure Laterality Date  . ABDOMINAL AORTOGRAM W/LOWER EXTREMITY Bilateral 06/15/2016   Procedure: Abdominal Aortogram w/Lower Extremity;  Surgeon: Conrad Byers, MD;  Location: [facility];  Service: Cardiovascular;  Laterality: Bilateral;  . ABDOMINAL HYSTERECTOMY    .  APPENDECTOMY    . APPLICATION OF WOUND VAC Left 09/12/2016   Procedure: APPLICATION OF WOUND VAC;  Surgeon: Conrad Hickory, MD;  Location: [facility];  Service: Vascular;  Laterality: Left;  . ARTERY EXPLORATION Left 07/25/2016   Procedure: ARTERY EXPLORATION LEFT ABOVE KNEE POPLITEAL AND GROIN;  Surgeon: Conrad Shiawassee, MD;  Location: [facility];  Service: Vascular;  Laterality: Left;  . AV FISTULA PLACEMENT Left 04/10/2017   Procedure: INSERTION OF ARTERIOVENOUS (AV) GORE-TEX GRAFT REDO LEFT UPPER ARM;  Surgeon: Conrad Kane, MD;  Location: [facility];  Service: Vascular;  Laterality: Left;  . CARDIAC CATHETERIZATION     last 2016  . CHOLECYSTECTOMY    . COLONOSCOPY    . D&Cs    . HEMATOMA EVACUATION Left 09/12/2016   Procedure:  EVACUATION HEMATOMA;  Surgeon: Conrad Edison, MD;  Location: [facility];  Service: Vascular;  Laterality: Left;  . INSERTION OF DIALYSIS CATHETER Left 04/17/2017   Procedure: INSERTION OF DIALYSIS CATHETER LEFT GROIN;  Surgeon: Conrad Echo, MD;  Location: [facility];  Service: Vascular;  Laterality: Left;  . INSERTION OF DIALYSIS CATHETER Right 04/10/2017   Procedure: INSERTION OF DIALYSIS CATHETER;  Surgeon: Conrad Brooker, MD;  Location: [facility];  Service: Vascular;  Laterality: Right;  . IR RADIOLOGIST EVAL & MGMT  03/13/2017  . LEFT HEART CATH AND CORONARY ANGIOGRAPHY N/A 11/22/2016   Procedure: LEFT HEART CATH AND CORONARY ANGIOGRAPHY;  Surgeon: Belva Crome, MD;  Location: [facility];  Service: Cardiovascular;  Laterality: N/A;  . LEFT HEART CATHETERIZATION WITH CORONARY ANGIOGRAM N/A 02/22/2011   Procedure: LEFT HEART CATHETERIZATION WITH CORONARY ANGIOGRAM;  Surgeon: Wellington Hampshire, MD;  Location: [facility];  Service: Cardiovascular;  Laterality: N/A;  . left nephrectomy    . MULTIPLE TOOTH EXTRACTIONS    . NEPHRECTOMY Left    Malignant tumor  . PERIPHERAL VASCULAR CATHETERIZATION N/A 12/31/2014   Procedure: Abdominal Aortogram;  Surgeon: Conrad Shoreham, MD;  Location: [facility];  Service: Cardiovascular;  Laterality: N/A;  . right ankle repair    . RIGHT HEART CATHETERIZATION N/A 01/29/2014   Procedure: RIGHT HEART CATH;  Surgeon: Larey Dresser, MD;  Location: [facility];  Service: Cardiovascular;  Laterality: N/A;  . TONSILLECTOMY    . TUBAL LIGATION    . ULTRASOUND GUIDANCE FOR VASCULAR ACCESS  11/22/2016   Procedure: Ultrasound Guidance For Vascular Access;  Surgeon: Belva Crome, MD;  Location: [facility];  Service: Cardiovascular;;  . UPPER EXTREMITY VENOGRAPHY Bilateral 03/22/2017   Procedure: UPPER EXTREMITY VENOGRAPHY;  Surgeon: Conrad Bell, MD;  Location: [facility];  Service: Cardiovascular;  Laterality: Bilateral;  bilateral  . VASCULAR SURGERY   11/2010   graft inserted to left arm    Family History  Problem Relation Age of Onset  . Heart disease Father        CAD, died age 48  . Hypertension Mother   . Dementia Mother   . Coronary artery disease Sister 43  . Hypertension Brother     Social History:  reports that she has quit smoking. Her smoking use included cigarettes. She smoked 0.00 packs per day for 53.00 years. She has never used smokeless tobacco. She reports that she drinks alcohol. She reports that she does not use drugs.  Allergies:  Allergies  Allergen Reactions  . Ace Inhibitors Anaphylaxis and Rash  . Heparin Other (See Comments)    MDs  told her not to take after reaction in ICU. Tolerates Lovenox fine  . Iohexol Swelling and Other (See Comments)    1970s; passed out and had facial/tongue swelling.  Requires 13-hour prep with prednisone and benadryl  . Meperidine Hcl Anaphylaxis  . Phenytoin Other (See Comments)    Had reaction while in ICU; doesn't know.  MDs told her not to take ever again.  . Wellbutrin [Bupropion] Other (See Comments)    seizures  . Iodinated Diagnostic Agents Hives, Itching, Rash and Other (See Comments)    Pre-meditate with benadryl and prednisone 3 times before appt.  . Morphine Rash  . Penicillins Hives and Rash    Has patient had a PCN reaction causing immediate rash, facial/tongue/throat swelling, SOB or lightheadedness with hypotension: Yes Has patient had a PCN reaction causing severe rash involving mucus membranes or skin necrosis: No Has patient had a PCN reaction that required hospitalization: No Has patient had a PCN reaction occurring within the last 10 years: No If all of the above answers are "NO", then may proceed with Cephalosporin use.    . Valproic Acid And Related Other (See Comments)    Confusion   . Codeine Itching and Rash  . Pentazocine Lactate Other (See Comments)    Patient does not remember reaction to this med ([place]).     Medications:   Scheduled: . sodium chloride   Intravenous Once  . sodium chloride   Intravenous Once  . ambrisentan  5 mg Oral Daily  . calcitRIOL  0.25 mcg Oral Q M,W,F-HD  . cilostazol  100 mg Oral BID AC  . cinacalcet  90 mg Oral Q M,W,F-HD  . digoxin  0.125 mg Oral Once per day on Mon Wed Fri  . levETIRAcetam  500 mg Oral Q2000  . levothyroxine  150 mcg Oral QAC breakfast  . multivitamin  1 tablet Oral QHS  . pantoprazole  40 mg Intravenous Q12H  . pravastatin  40 mg Oral QHS  . ranolazine  500 mg Oral BID  . sevelamer carbonate  1,600 mg Oral TID WC   Continuous:   Results for orders placed or performed during the hospital encounter of 06/28/17 (from the past 24 hour(s))  POC occult blood, ED RN will collect     Status: Abnormal   Collection Time: 06/28/17  4:34 PM  Result Value Ref Range   Fecal Occult Bld POSITIVE (A) NEGATIVE  CBC with Differential/Platelet     Status: Abnormal   Collection Time: 06/28/17  4:50 PM  Result Value Ref Range   WBC 4.1 4.0 - 10.5 K/uL   RBC 3.71 (L) 3.87 - 5.11 MIL/uL   Hemoglobin 10.0 (L) 12.0 - 15.0 g/dL   HCT 31.1 (L) 36.0 - 46.0 %   MCV 83.8 78.0 - 100.0 fL   MCH 27.0 26.0 - 34.0 pg   MCHC 32.2 30.0 - 36.0 g/dL   RDW 15.6 (H) 11.5 - 15.5 %   Platelets 158 150 - 400 K/uL   Neutrophils Relative % 54 %   Neutro Abs 2.2 1.7 - 7.7 K/uL   Lymphocytes Relative 28 %   Lymphs Abs 1.1 0.7 - 4.0 K/uL   Monocytes Relative 14 %   Monocytes Absolute 0.6 0.1 - 1.0 K/uL   Eosinophils Relative 3 %   Eosinophils Absolute 0.1 0.0 - 0.7 K/uL   Basophils Relative 1 %   Basophils Absolute 0.0 0.0 - 0.1 K/uL   Immature Granulocytes 0 %   Abs Immature  Granulocytes 0.0 0.0 - 0.1 K/uL  Comprehensive metabolic panel     Status: Abnormal   Collection Time: 06/28/17  4:50 PM  Result Value Ref Range   Sodium 137 135 - 145 mmol/L   Potassium 5.1 3.5 - 5.1 mmol/L   Chloride 94 (L) 98 - 111 mmol/L   CO2 26 22 - 32 mmol/L   Glucose, Bld 70 70 - 99 mg/dL   BUN 63 (H)  8 - 23 mg/dL   Creatinine, Ser 6.37 (H) 0.44 - 1.00 mg/dL   Calcium 8.7 (L) 8.9 - 10.3 mg/dL   Total Protein 6.6 6.5 - 8.1 g/dL   Albumin 3.1 (L) 3.5 - 5.0 g/dL   AST 37 15 - 41 U/L   ALT 26 0 - 44 U/L   Alkaline Phosphatase 76 38 - 126 U/L   Total Bilirubin 1.0 0.3 - 1.2 mg/dL   GFR calc non Af Amer 6 (L) >60 mL/min   GFR calc Af Amer 7 (L) >60 mL/min   Anion gap 17 (H) 5 - 15  Type and screen     Status: None (Preliminary result)   Collection Time: 06/28/17  4:50 PM  Result Value Ref Range   ABO/RH(D) B POS    Antibody Screen NEG    Sample Expiration      07/01/2017 Performed at [facility], [address], [place], [place] [address]    Unit Number [id number]    Blood Component Type RED CELLS,LR    Unit division 00    Status of Unit ISSUED    Transfusion Status OK TO TRANSFUSE    Crossmatch Result Compatible    Unit Number [id number]    Blood Component Type RED CELLS,LR    Unit division 00    Status of Unit ISSUED    Transfusion Status OK TO TRANSFUSE    Crossmatch Result Compatible    Unit Number [id number]    Blood Component Type RED CELLS,LR    Unit division 00    Status of Unit ALLOCATED    Transfusion Status OK TO TRANSFUSE    Crossmatch Result Compatible    Unit Number [id number]    Blood Component Type RED CELLS,LR    Unit division 00    Status of Unit ALLOCATED    Transfusion Status OK TO TRANSFUSE    Crossmatch Result Compatible    Unit Number [id number]    Blood Component Type RED CELLS,LR    Unit division 00    Status of Unit ALLOCATED    Transfusion Status OK TO TRANSFUSE    Crossmatch Result Compatible    Unit Number [id number]    Blood Component Type RED CELLS,LR    Unit division 00    Status of Unit ALLOCATED    Transfusion Status OK TO TRANSFUSE    Crossmatch Result Compatible   Protime-INR     Status: Abnormal   Collection Time: 06/28/17  4:50 PM  Result Value Ref Range   Prothrombin Time 28.6 (H) 11.4 - 15.2  seconds   INR 2.71   Prepare fresh frozen plasma     Status: None (Preliminary result)   Collection Time: 06/28/17  7:59 PM  Result Value Ref Range   Unit Number [id number]    Blood Component Type THW PLS APHR    Unit division A0    Status of Unit ISSUED    Transfusion Status OK TO TRANSFUSE   CBC     Status: Abnormal   Collection Time:  06/28/17 10:16 PM  Result Value Ref Range   WBC 4.2 4.0 - 10.5 K/uL   RBC 3.33 (L) 3.87 - 5.11 MIL/uL   Hemoglobin 9.1 (L) 12.0 - 15.0 g/dL   HCT 28.0 (L) 36.0 - 46.0 %   MCV 84.1 78.0 - 100.0 fL   MCH 27.3 26.0 - 34.0 pg   MCHC 32.5 30.0 - 36.0 g/dL   RDW 15.8 (H) 11.5 - 15.5 %   Platelets 157 150 - 400 K/uL  Prepare RBC     Status: None   Collection Time: 06/29/17  1:03 AM  Result Value Ref Range   Order Confirmation      ORDER PROCESSED BY BLOOD BANK Performed at [facility], [place] [address] [place], [place] [address]   CBC     Status: Abnormal   Collection Time: 06/29/17  2:23 AM  Result Value Ref Range   WBC 3.6 (L) 4.0 - 10.5 K/uL   RBC 3.21 (L) 3.87 - 5.11 MIL/uL   Hemoglobin 8.7 (L) 12.0 - 15.0 g/dL   HCT 26.9 (L) 36.0 - 46.0 %   MCV 83.8 78.0 - 100.0 fL   MCH 27.1 26.0 - 34.0 pg   MCHC 32.3 30.0 - 36.0 g/dL   RDW 15.6 (H) 11.5 - 15.5 %   Platelets 110 (L) 150 - 400 K/uL  Prepare RBC     Status: None   Collection Time: 06/29/17  2:32 AM  Result Value Ref Range   Order Confirmation      ORDER PROCESSED BY BLOOD BANK Performed at [facility], [address], [place], [place]  [address]      No results found.  ROS:  As stated above in the HPI otherwise negative.  Blood pressure 114/71, pulse 82, temperature (!) 97.5 F (36.4 C), temperature source Oral, resp. rate 16, height 5\' 4"  (1.626 m), weight 73.9 kg (163 lb), SpO2 98 %.    PE: Gen: NAD, Alert and Oriented HEENT:  Gap/AT, EOMI Neck: Supple, no LAD Lungs: CTA Bilaterally CV: RRR without M/G/R ABM: Soft, NTND, +BS Ext: No  C/C/E  Assessment/Plan: 1) GI bleed. 2) Melena. 3) ? Hematochezia. 4) Diverticula. 5) Multiple medical problems.   The rectal examination was significant for watery melenic stools.  An EGD will be performed today.  Her blood pressure has improved.  Pending the results, a colonoscopy may be performed in the hospital or as an outpatient with Dr. Collene Mares.  She was called from the office this past April to schedule a colonoscopy, but she did not respond.  Plan: 1) EGD today.  Nyair Depaulo D 06/29/2017, 8:10 AM

## 2017-06-29 NOTE — Progress Notes (Signed)
Dr. Blaine Hamper paged with patient's recent hemoglobin result of 8.7.

## 2017-06-29 NOTE — Progress Notes (Signed)
PROGRESS NOTE  [name] OEU:[id number] DOB: 11/30/1945 DOA: 06/28/2017 PCP: Glendale Chard, MD  HPI/Recap of past 24 hours:  Patient is seen with RN in room, her daughter is at bedside She appear comfortable, she declined further testing or procedure, she wants to be on comforts measures,  She does want to continue take her medicines  Assessment/Plan: Principal Problem:   GIB (gastrointestinal bleeding) Active Problems:   CAD (coronary artery disease)   History of CVA (cerebrovascular accident)   PVD   HTN (hypertension)   Pulmonary hypertension ([place])   Atrial fibrillation with RVR ([place])   ESRD on dialysis ([place])   Anemia in ESRD (end-stage renal disease) ([place])   HLD (hyperlipidemia)  GI bleed , she declined further testing.  Gi input appreciated  Acute blood loss anemia hgb on presentation was 8.7, she received 3units prbc on admission She declined further testing or prbc transfusion She wants to be on full comfort measures and want to discharge to residential hospice  ESRD has been on dialysis, she declined further dialysis Nephrology input appreciated  H/o cad/systolic chf, euvolemic currently  H/o afib/cva/seizure Coumadin held since admission She wants to continue take her other home meds for now She wants to  discharge to residential hospitce   Code Status: DNR  Family Communication: patient and daughter at bedside  Disposition Plan: residential hospice once bed available   Consultants: EDP discussed case with critical care  GI Dr Benson [place]  Nephrology   hospice  Procedures:  prbc transfusion x3units on presentation  Antibiotics:  none   Objective: BP 114/71   Pulse 82   Temp (!) 97.5 F (36.4 C) (Oral)   Resp 16   Ht 5\' 4"  (1.626 m)   Wt 73.9 kg (163 lb)   SpO2 98%   BMI 27.98 kg/m   Intake/Output Summary (Last 24 hours) at 06/29/2017 0912 Last data filed at 06/29/2017 0657 Gross per 24 hour  Intake 862.5 ml  Output -  Net  862.5 ml   Filed Weights   06/28/17 2112 06/29/17 0351 06/29/17 0626  Weight: 72.2 kg (159 lb 1.6 oz) 74.6 kg (164 lb 7.4 oz) 73.9 kg (163 lb)    Exam: Patient is examined daily including today on 06/29/2017, exams remain the same as of yesterday except that has changed    General:  NAD  Cardiovascular: IRRR  Respiratory: CTABL  Abdomen: Soft/ND/NT, positive BS  Musculoskeletal: No Edema  Neuro: alert, oriented   Data Reviewed: Basic Metabolic Panel: Recent Labs  Lab 06/22/17 1514 06/28/17 1650  NA 137 137  K 3.7 5.1  CL 104 94*  CO2 23 26  GLUCOSE 99 70  BUN 26* 63*  CREATININE 4.31* 6.37*  CALCIUM 7.2* 8.7*  PHOS 3.9  --    Liver Function Tests: Recent Labs  Lab 06/22/17 1514 06/28/17 1650  AST  --  37  ALT  --  26  ALKPHOS  --  76  BILITOT  --  1.0  PROT  --  6.6  ALBUMIN 2.4* 3.1*   No results for input(s): LIPASE, AMYLASE in the last 168 hours. No results for input(s): AMMONIA in the last 168 hours. CBC: Recent Labs  Lab 06/28/17 1650 06/28/17 2216 06/29/17 0223 06/29/17 0825 06/29/17 0857  WBC 4.1 4.2 3.6* 6.2 DUPLICATE  NEUTROABS 2.2  --   --  4.7 PENDING  HGB 10.0* 9.1* 8.7* 15.4 DUPLICATE  HCT 00.8* 67.6* 19.5* 09.3 DUPLICATE  MCV 26.7 12.4 58.0 99.8 DUPLICATE  PLT 158 014 103* 013* DUPLICATE   Cardiac Enzymes:   No results for input(s): CKTOTAL, CKMB, CKMBINDEX, TROPONINI in the last 168 hours. BNP (last 3 results) Recent Labs    04/14/17 1533  BNP 600.3*    ProBNP (last 3 results) No results for input(s): PROBNP in the last 8760 hours.  CBG: No results for input(s): GLUCAP in the last 168 hours.  Recent Results (from the past 240 hour(s))  MRSA PCR Screening     Status: None   Collection Time: 06/21/17  1:18 AM  Result Value Ref Range Status   MRSA by PCR NEGATIVE NEGATIVE Final    Comment:        The GeneXpert MRSA Assay (FDA approved for NASAL specimens only), is one component of a comprehensive MRSA  colonization surveillance program. It is not intended to diagnose MRSA infection nor to guide or monitor treatment for MRSA infections. Performed at [facility], [place] [address], [place], [place] [address]      Studies: No results found.  Scheduled Meds: . sodium chloride   Intravenous Once  . sodium chloride   Intravenous Once  . ambrisentan  5 mg Oral Daily  . calcitRIOL  0.25 mcg Oral Q M,W,F-HD  . cilostazol  100 mg Oral BID AC  . cinacalcet  90 mg Oral Q M,W,F-HD  . digoxin  0.125 mg Oral Once per day on Mon Wed Fri  . levETIRAcetam  500 mg Oral Q2000  . levothyroxine  150 mcg Oral QAC breakfast  . multivitamin  1 tablet Oral QHS  . pantoprazole  40 mg Intravenous Q12H  . pravastatin  40 mg Oral QHS  . ranolazine  500 mg Oral BID  . sevelamer carbonate  1,600 mg Oral TID WC    Continuous Infusions:   Time spent: 5mins I have personally reviewed and interpreted on  06/29/2017 daily labs, tele strips, imagings as discussed above under date review session and assessment and plans.  I reviewed all nursing notes, pharmacy notes, consultant notes,  vitals, pertinent old records  I have discussed plan of care as described above with RN , patient and family on 06/29/2017   Florencia Reasons MD, PhD  [facility] Pager [phone]. If 7PM-7AM, please contact night-coverage at [email or web address], password [facility] 06/29/2017, 9:12 AM  LOS: 1 day

## 2017-06-30 DIAGNOSIS — K922 Gastrointestinal hemorrhage, unspecified: Secondary | ICD-10-CM

## 2017-06-30 LAB — BPAM FFP
Blood Product Expiration Date: 201907022359
ISSUE DATE / TIME: 201906280046
Unit Type and Rh: 7300

## 2017-06-30 LAB — PREPARE FRESH FROZEN PLASMA

## 2017-06-30 MED ORDER — DIPHENHYDRAMINE HCL 50 MG/ML IJ SOLN
12.5000 mg | Freq: Three times a day (TID) | INTRAMUSCULAR | Status: DC | PRN
  Administered 2017-06-30 – 2017-07-01 (×2): 12.5 mg via INTRAVENOUS
  Filled 2017-06-30 (×2): qty 1

## 2017-06-30 MED ORDER — HYDROCORTISONE 2.5 % RE CREA
TOPICAL_CREAM | Freq: Two times a day (BID) | RECTAL | Status: DC
  Administered 2017-06-30 – 2017-07-01 (×2): via RECTAL
  Filled 2017-06-30: qty 28.35

## 2017-06-30 NOTE — Care Management (Signed)
[facility] will not have a bed today and has four patients on waiting list.  Family does not want patient to be relocated from [facility] and is agreeable to take patient home.  Bevely Palmer with [facility] advised and will come visit patient shortly.  Pt will need hospital bed delivered, which may not be until tomorrow.

## 2017-06-30 NOTE — Progress Notes (Signed)
Clinical Social Worker following patient for support and discharge needs. At this time [facility] still does not have a bed available for patient. CSW met patient and daughter [name]) at bedside to make them aware of status at [facility].   [name] stated that since patient is unable to get into [facility] they would like patient to go home with hospice to follow. CSW referred patient and family to [facility] to assist with going home with Hospice. CSW signing off as patients social work needs have been addressed.   Rhea Pink, MSW,  [place]

## 2017-06-30 NOTE — Progress Notes (Signed)
[facility] Liaison: RN visit   Notified by Quenton Fetter, [facility] of patient/family request for [facility] services at home after discharge. Chart and patient information reviewed and approved by [facility] physician.    Writer spoke with daughter [name] at bedside to initiate education related to hospice philosophy, services and team approach to care. [name] verbalized understanding of information given. Per discussion, plan is for discharge to home by [facility] tomorrow after DME is delivered.   Please send signed and completed DNR form home with patient/family. Patient will need prescriptions for discharge comfort medications.   DME needs have been discussed, patient currently has the following equipment in the home: waler.  Patient/family requests the following DME for delivery to the home: hospital bed, OBT, 3N1, and transfer w/c. [facility] equipment manager has been notified and will contact [facility] to arrange delivery to the home. Home address has been verified and is correct in the chart. [name] is the family member to contact to arrange time of delivery.   [facility] Referral Center aware of the above. Please notify [facility] when patient is ready to leave the unit at discharge. (Call [phone] or [phone] after 5pm.) [facility] information and contact numbers given to [name] at time of visit. Above information shared with Quenton Fetter, [facility]   Please call with any hospice related questions.   Thank you for this referral.   Farrel Gordon, RN, [facility] Liaison [phone] ? Hospital liaisons are now on [place].

## 2017-06-30 NOTE — Progress Notes (Signed)
Pt is now comfort care and no further HD is planned. Will sign off.    Kelly Splinter MD [employer] 06/30/2017, 12:52 PM

## 2017-06-30 NOTE — Progress Notes (Signed)
PROGRESS NOTE  [name] [id number] DOB: August 30, 1945 DOA: 06/28/2017 PCP: Glendale Chard, MD  HPI/Recap of past 24 hours:  Patient is seen with RN in room, her daughter is at bedside She appear comfortable,  She wants regular diet She does want to continue take her medicines  Assessment/Plan: Principal Problem:   GIB (gastrointestinal bleeding) Active Problems:   CAD (coronary artery disease)   History of CVA (cerebrovascular accident)   PVD   HTN (hypertension)   Pulmonary hypertension ([place])   Atrial fibrillation with RVR ([place])   ESRD on dialysis ([place])   Anemia in ESRD (end-stage renal disease) ([place])   HLD (hyperlipidemia)  GI bleed, she declined further testing.  Gi input appreciated  Acute blood loss anemia hgb on presentation was 8.7, she received 3units prbc on admission She declined further testing or prbc transfusion She wants to be on full comfort measures,  hospice  ESRD has been on dialysis, she declined further dialysis Nephrology input appreciated  H/o cad/systolic chf, euvolemic currently  H/o afib/cva/seizure Coumadin held since admission She wants to continue take her other home meds for now She wants to  discharge to residential hospitce   Code Status: DNR  Family Communication: patient and daughter at bedside  Disposition Plan: home hospice on 6/29   Consultants: EDP discussed case with critical care  GI Dr Benson Norway  Nephrology   hospice  Procedures:  prbc transfusion x3units on presentation  Antibiotics:  none   Objective: BP 125/71 (BP Location: Right Arm)   Pulse (!) 102   Temp 98 F (36.7 C) (Oral)   Resp 18   Ht 5\' 4"  (1.626 m)   Wt 74.1 kg (163 lb 4.8 oz)   SpO2 97%   BMI 28.03 kg/m   Intake/Output Summary (Last 24 hours) at 06/30/2017 1812 Last data filed at 06/30/2017 1700 Gross per 24 hour  Intake 720 ml  Output -  Net 720 ml   Filed Weights   06/29/17 0351 06/29/17 0626 06/30/17 0408  Weight:  74.6 kg (164 lb 7.4 oz) 73.9 kg (163 lb) 74.1 kg (163 lb 4.8 oz)    Exam: Patient is examined daily including today on 06/30/2017, exams remain the same as of yesterday except that has changed    General:  NAD  Cardiovascular: IRRR  Respiratory: CTABL  Abdomen: Soft/ND/NT, positive BS  Musculoskeletal: No Edema  Neuro: alert, oriented   Data Reviewed: Basic Metabolic Panel: Recent Labs  Lab 06/28/17 1650  NA 137  K 5.1  CL 94*  CO2 26  GLUCOSE 70  BUN 63*  CREATININE 6.37*  CALCIUM 8.7*   Liver Function Tests: Recent Labs  Lab 06/28/17 1650  AST 37  ALT 26  ALKPHOS 76  BILITOT 1.0  PROT 6.6  ALBUMIN 3.1*   No results for input(s): LIPASE, AMYLASE in the last 168 hours. No results for input(s): AMMONIA in the last 168 hours. CBC: Recent Labs  Lab 06/28/17 1650 06/28/17 2216 06/29/17 0223 06/29/17 0825 06/29/17 0857  WBC 4.1 4.2 3.6* 6.2 DUPLICATE  NEUTROABS 2.2  --   --  4.7 PENDING  HGB 10.0* 9.1* 8.7* 38.1 DUPLICATE  HCT 01.7* 51.0* 25.8* 52.7 DUPLICATE  MCV 78.2 42.3 53.6 14.4 DUPLICATE  PLT 315 400 867* 619* DUPLICATE   Cardiac Enzymes:   No results for input(s): CKTOTAL, CKMB, CKMBINDEX, TROPONINI in the last 168 hours. BNP (last 3 results) Recent Labs    04/14/17 1533  BNP 600.3*    ProBNP (  last 3 results) No results for input(s): PROBNP in the last 8760 hours.  CBG: No results for input(s): GLUCAP in the last 168 hours.  Recent Results (from the past 240 hour(s))  MRSA PCR Screening     Status: None   Collection Time: 06/21/17  1:18 AM  Result Value Ref Range Status   MRSA by PCR NEGATIVE NEGATIVE Final    Comment:        The GeneXpert MRSA Assay (FDA approved for NASAL specimens only), is one component of a comprehensive MRSA colonization surveillance program. It is not intended to diagnose MRSA infection nor to guide or monitor treatment for MRSA infections. Performed at [facility], [place] [address],  [place], [place] [address]      Studies: No results found.  Scheduled Meds: . ambrisentan  5 mg Oral Daily  . cilostazol  100 mg Oral BID AC  . digoxin  0.125 mg Oral Once per day on Mon Wed Fri  . hydrocortisone   Rectal BID  . levETIRAcetam  500 mg Oral Q2000  . levothyroxine  150 mcg Oral QAC breakfast  . pantoprazole  40 mg Intravenous Q12H  . pravastatin  40 mg Oral QHS  . ranolazine  500 mg Oral BID    Continuous Infusions:   Time spent: 38mins   I reviewed all nursing notes,  consultant notes,  vitals, pertinent old records  I have discussed plan of care as described above with RN , patient and family on 06/30/2017   Florencia Reasons MD, PhD  [facility] Pager [phone]. If 7PM-7AM, please contact night-coverage at [email or web address], password [facility] 06/30/2017, 6:12 PM  LOS: 2 days

## 2017-07-01 ENCOUNTER — Other Ambulatory Visit: Payer: Self-pay | Admitting: Cardiology

## 2017-07-01 LAB — LEVETIRACETAM LEVEL: Levetiracetam Lvl: 10.2 ug/mL (ref 10.0–40.0)

## 2017-07-01 MED ORDER — SENNOSIDES-DOCUSATE SODIUM 8.6-50 MG PO TABS
2.0000 | ORAL_TABLET | Freq: Two times a day (BID) | ORAL | Status: DC | PRN
Start: 2017-07-01 — End: 2017-07-01
  Administered 2017-07-01: 2 via ORAL

## 2017-07-01 NOTE — Progress Notes (Signed)
Patient will Discharge To: [facility] Date:07/01/17 Family Notified: Yes, (bedside) [name] [phone] Transport By: Corey Harold   Per MD patient ready for DC to [facility] . RN, patient, patient's family, and facility notified of DC. Assessment, Fl2/Pasrr, and Discharge Summary sent to facility. RN given number for report [phone]). DC packet on chart. Ambulance transport requested for patient.   CSW signing off.  Reed Breech LCSWA [phone]

## 2017-07-01 NOTE — Progress Notes (Signed)
[name] has a bed for patient today.  Family was made aware of placement.  Audrea Muscat will meet family between 11:00am- 11:30 am to complete paperwork.  Reed Breech LCSWA [phone]

## 2017-07-01 NOTE — Discharge Summary (Signed)
Discharge Summary  [name] [id number]:[id number] DOB: [birth year]  PCP: Glendale Chard, MD  Admit date: 06/28/2017 Discharge date: 07/01/2017  Time spent: 53mins, more than 50% time spent on coordination of care.  Patient is to discharge to residential hospice at [facility]    Discharge Diagnoses:  Active Hospital Problems   Diagnosis Date Noted  . GIB (gastrointestinal bleeding) 06/28/2017  . ESRD on dialysis ([place]) 06/28/2017  . Anemia in ESRD (end-stage renal disease) ([place]) 06/28/2017  . HLD (hyperlipidemia) 06/28/2017  . Atrial fibrillation with RVR ([place]) 11/20/2016  . Pulmonary hypertension ([place]) 09/23/2012  . HTN (hypertension) 04/30/2011  . History of CVA (cerebrovascular accident) 07/27/2008  . PVD 07/27/2008  . CAD (coronary artery disease) 07/27/2008    Resolved Hospital Problems  No resolved problems to display.    Discharge Condition: stable  Diet recommendation: regular diet  Filed Weights   06/29/17 0626 06/30/17 0408 07/01/17 0510  Weight: 73.9 kg (163 lb) 74.1 kg (163 lb 4.8 oz) 75.3 kg (166 lb)    History of present illness: (per admitting MD Dr Ivor Costa) PCP: Glendale Chard, MD   Patient coming from:  The patient is coming from home.  At baseline, pt is independent for most of ADL.   Chief Complaint: Dark and maroon stool  HPI: [name] is a 72 y.o. female with medical history significant of A. fib on Coumadin, ESRD-HD (MWF), hypertension, hyperlipidemia, asthma, stroke, GERD, hypothyroidism, PVD, pulmonary hypertension, OSA not on CPAP, seizure, sCHF with EF of 40%, who presents with dark and maroon stool.  Patient states that  She has been having intermittent dark and maroon stool in the past 6 days.  She developed fatigue, no syncope. She had another episode of dark stool BM with large amount of blood in ED. Patient denies chest pain, shortness of breath.  No cough, fever or chills.  She has nausea, no vomiting or abdominal pain.   Denies symptoms of UTI or unilateral weakness.  Patient states that she had a colonoscopy approximately 2 years ago by Dr. Collene Mares, which was negative. She had last dialysis on Wednesday.  ED Course: pt was found to have hemoglobin dropped from 11.3 on 06/23/17-10.0 today, positive FOBT, potassium 5.1, bicarbonate 26, creatinine 6.37, BUN 63, temperature 97, tachycardia, heart rate 110, oxygen saturation 94% on room air.  Patient is admitted to stepdown as inpatient.     Hospital Course:  Principal Problem:   GIB (gastrointestinal bleeding) Active Problems:   CAD (coronary artery disease)   History of CVA (cerebrovascular accident)   PVD   HTN (hypertension)   Pulmonary hypertension ([facility])   Atrial fibrillation with RVR ([place])   ESRD on dialysis ([place])   Anemia in ESRD (end-stage renal disease) ([place])   HLD (hyperlipidemia)   GI bleed, she declined further testing.  Gi input appreciated  Acute blood loss anemia hgb on presentation was 8.7, she received 3units prbc on admission She declined further testing or prbc transfusion She wants to be on full comfort measures,  hospice  ESRD has been on dialysis, she declined further dialysis Nephrology input appreciated  H/o cad/systolic chf, euvolemic currently  H/o afib/cva/seizure Coumadin held since admission She wants to  discharge to residential hospitce   Code Status: DNR  Family Communication: patient and daughter at bedside  Disposition Plan: rediential hospice on 6/29   Consultants: EDP discussed case with critical care  GI Dr Benson [place]  Nephrology   hospice  Procedures:  prbc transfusion x3units on  presentation  Antibiotics:  none   Discharge Exam: BP 119/69 (BP Location: Right Arm)   Pulse (!) 110   Temp 97.8 F (36.6 C) (Oral)   Resp (!) 25   Ht 5\' 4"  (1.626 m)   Wt 75.3 kg (166 lb)   SpO2 95%   BMI 28.49 kg/m    General:  NAD  Cardiovascular: IRRR  Respiratory: CTABL  Abdomen:  Soft/ND/NT, positive BS  Musculoskeletal: No Edema  Neuro: alert, oriented      Discharge Instructions    Diet general   Complete by:  As directed    Increase activity slowly   Complete by:  As directed      Allergies as of 07/01/2017      Reactions   Ace Inhibitors Anaphylaxis, Rash   Heparin Other (See Comments)   MDs told her not to take after reaction in ICU. Tolerates Lovenox fine   Iohexol Swelling, Other (See Comments)   1970s; passed out and had facial/tongue swelling.  Requires 13-hour prep with prednisone and benadryl   Meperidine Hcl Anaphylaxis   Phenytoin Other (See Comments)   Had reaction while in ICU; doesn't know.  MDs told her not to take ever again.   Wellbutrin [bupropion] Other (See Comments)   seizures   Iodinated Diagnostic Agents Hives, Itching, Rash, Other (See Comments)   Pre-meditate with benadryl and prednisone 3 times before appt.   Morphine Rash   Penicillins Hives, Rash   Has patient had a PCN reaction causing immediate rash, facial/tongue/throat swelling, SOB or lightheadedness with hypotension: Yes Has patient had a PCN reaction causing severe rash involving mucus membranes or skin necrosis: No Has patient had a PCN reaction that required hospitalization: No Has patient had a PCN reaction occurring within the last 10 years: No If all of the above answers are "NO", then may proceed with Cephalosporin use.   Valproic Acid And Related Other (See Comments)   Confusion   Codeine Itching, Rash   Pentazocine Lactate Other (See Comments)   Patient does not remember reaction to this med (Talwin).      Medication List    STOP taking these medications   acetaminophen 500 MG tablet Commonly known as:  TYLENOL   albuterol 108 (90 Base) MCG/ACT inhaler Commonly known as:  PROVENTIL HFA;VENTOLIN HFA   aspirin 81 MG EC tablet   calcitRIOL 0.25 MCG capsule Commonly known as:  ROCALTROL   camphor-menthol lotion Commonly known as:  SARNA     cilostazol 100 MG tablet Commonly known as:  PLETAL   Darbepoetin Alfa 60 MCG/0.3ML Sosy injection Commonly known as:  ARANESP   digoxin 0.125 MG tablet Commonly known as:  LANOXIN   diltiazem 60 MG tablet Commonly known as:  CARDIZEM   diphenhydrAMINE 50 MG capsule Commonly known as:  BENADRYL   feeding supplement (NEPRO CARB STEADY) Liqd   HYDROcodone-acetaminophen 5-325 MG tablet Commonly known as:  NORCO   LETAIRIS 5 MG tablet Generic drug:  ambrisentan   levETIRAcetam 500 MG tablet Commonly known as:  KEPPRA   levothyroxine 150 MCG tablet Commonly known as:  SYNTHROID, LEVOTHROID   Metoprolol Tartrate 75 MG Tabs   multivitamin Tabs tablet   nitroGLYCERIN 0.4 MG SL tablet Commonly known as:  NITROSTAT   omeprazole 20 MG tablet Commonly known as:  PRILOSEC OTC   patiromer 8.4 g packet Commonly known as:  VELTASSA   polyethylene glycol packet Commonly known as:  MIRALAX / GLYCOLAX   pravastatin 40 MG tablet  Commonly known as:  PRAVACHOL   ranolazine 500 MG 12 hr tablet Commonly known as:  RANEXA   senna-docusate 8.6-50 MG tablet Commonly known as:  Senokot-S   SENSIPAR 90 MG tablet Generic drug:  cinacalcet   sevelamer carbonate 800 MG tablet Commonly known as:  RENVELA   traMADol 50 MG tablet Commonly known as:  ULTRAM   traZODone 50 MG tablet Commonly known as:  DESYREL   warfarin 2.5 MG tablet Commonly known as:  COUMADIN   warfarin 5 MG tablet Commonly known as:  COUMADIN   warfarin 7.5 MG tablet Commonly known as:  COUMADIN      Allergies  Allergen Reactions  . Ace Inhibitors Anaphylaxis and Rash  . Heparin Other (See Comments)    MDs told her not to take after reaction in ICU. Tolerates Lovenox fine  . Iohexol Swelling and Other (See Comments)    1970s; passed out and had facial/tongue swelling.  Requires 13-hour prep with prednisone and benadryl  . Meperidine Hcl Anaphylaxis  . Phenytoin Other (See Comments)    Had  reaction while in ICU; doesn't know.  MDs told her not to take ever again.  . Wellbutrin [Bupropion] Other (See Comments)    seizures  . Iodinated Diagnostic Agents Hives, Itching, Rash and Other (See Comments)    Pre-meditate with benadryl and prednisone 3 times before appt.  . Morphine Rash  . Penicillins Hives and Rash    Has patient had a PCN reaction causing immediate rash, facial/tongue/throat swelling, SOB or lightheadedness with hypotension: Yes Has patient had a PCN reaction causing severe rash involving mucus membranes or skin necrosis: No Has patient had a PCN reaction that required hospitalization: No Has patient had a PCN reaction occurring within the last 10 years: No If all of the above answers are "NO", then may proceed with Cephalosporin use.    . Valproic Acid And Related Other (See Comments)    Confusion   . Codeine Itching and Rash  . Pentazocine Lactate Other (See Comments)    Patient does not remember reaction to this med (Talwin).       The results of significant diagnostics from this hospitalization (including imaging, microbiology, ancillary and laboratory) are listed below for reference.    Significant Diagnostic Studies: Dg Chest 2 View  Result Date: 06/20/2017 CLINICAL DATA:  Weakness EXAM: CHEST - 2 VIEW COMPARISON:  04/20/2017 FINDINGS: Shallow inspiration. Cardiac enlargement. Pulmonary vascularity is normal for technique. No definite infiltration or edema. No blunting of costophrenic angles. No pneumothorax. Calcification of the aorta. Degenerative changes in the spine and shoulders. IMPRESSION: Cardiac enlargement. No evidence of active pulmonary disease. Aortic atherosclerosis. Electronically Signed   By: Lucienne Capers M.D.   On: 06/20/2017 21:29   Ct Head Wo Contrast  Result Date: 06/20/2017 CLINICAL DATA:  Patient fell attempting to get out of bed. EXAM: CT HEAD WITHOUT CONTRAST TECHNIQUE: Contiguous axial images were obtained from the base of  the skull through the vertex without intravenous contrast. COMPARISON:  08/24/2015 FINDINGS: Brain: Remote infarcts in the left frontal, parietal, bilateral cerebellar and left occipital lobes as before. No acute intracranial hemorrhage, intra-axial mass nor extra-axial fluid collections. No hydrocephalus. Chronic microvascular ischemic disease. Vascular: No hyperdense vessel sign. Atherosclerosis of the cavernous internal carotids. Skull: No displaced calvarial fracture. Sinuses/Orbits: No acute finding. Other: No significant scalp swelling. IMPRESSION: 1. Chronic stable microvascular ischemic disease and chronic infarcts as above described. 2. No acute intracranial abnormality.  No skull fracture noted. Electronically  Signed   By: Ashley Royalty M.D.   On: 06/20/2017 20:57   Dg Toe 2nd Right  Result Date: 06/22/2017 CLINICAL DATA:  Pain and swelling due to blunt trauma. EXAM: RIGHT SECOND TOE COMPARISON:  None. FINDINGS: There is no evidence of fracture or dislocation. There is no evidence of arthropathy or other focal bone abnormality. Soft tissues are unremarkable. IMPRESSION: Negative. Electronically Signed   By: Lorriane Shire M.D.   On: 06/22/2017 09:46   Dg Toe 3rd Right  Result Date: 06/22/2017 CLINICAL DATA:  Pain and swelling after blunt trauma. EXAM: RIGHT THIRD TOE COMPARISON:  None. FINDINGS: There is no evidence of fracture or dislocation. There is no evidence of arthropathy or other focal bone abnormality. Soft tissues are unremarkable. IMPRESSION: Negative. Electronically Signed   By: Lorriane Shire M.D.   On: 06/22/2017 09:46    Microbiology: No results found for this or any previous visit (from the past 240 hour(s)).   Labs: Basic Metabolic Panel: Recent Labs  Lab 06/28/17 1650  NA 137  K 5.1  CL 94*  CO2 26  GLUCOSE 70  BUN 63*  CREATININE 6.37*  CALCIUM 8.7*   Liver Function Tests: Recent Labs  Lab 06/28/17 1650  AST 37  ALT 26  ALKPHOS 76  BILITOT 1.0  PROT 6.6    ALBUMIN 3.1*   No results for input(s): LIPASE, AMYLASE in the last 168 hours. No results for input(s): AMMONIA in the last 168 hours. CBC: Recent Labs  Lab 06/28/17 1650 06/28/17 2216 06/29/17 0223 06/29/17 0825 06/29/17 0857  WBC 4.1 4.2 3.6* 6.2 DUPLICATE  NEUTROABS 2.2  --   --  4.7 PENDING  HGB 10.0* 9.1* 8.7* 79.0 DUPLICATE  HCT 38.3* 33.8* 32.9* 19.1 DUPLICATE  MCV 66.0 60.0 45.9 97.7 DUPLICATE  PLT 414 239 532* 023* DUPLICATE   Cardiac Enzymes: No results for input(s): CKTOTAL, CKMB, CKMBINDEX, TROPONINI in the last 168 hours. BNP: BNP (last 3 results) Recent Labs    04/14/17 1533  BNP 600.3*    ProBNP (last 3 results) No results for input(s): PROBNP in the last 8760 hours.  CBG: No results for input(s): GLUCAP in the last 168 hours.     Signed:  Florencia Reasons MD, PhD  [facility] 07/01/2017, 12:24 PM

## 2017-07-01 NOTE — Progress Notes (Signed)
[facility] RN note.  [facility] is able to accept patient today, family states that is their first choice verses going home with Hospice.  Reed Breech, CSW aware.  Registration paper work completed. Dr. Orpah Melter to assume care per family request. Please fax discharge summary to [phone]. RN please call report to [phone]. Please arrange transport for patient to arrive as soon as possible.  Thank you,  Farrel Gordon, RN, [facility] Liaison  [place] are on AMION

## 2017-07-01 NOTE — Progress Notes (Signed)
RN called report to [facility]. [facility] received patients discharge information and DNR form. Patient IVs were removed.

## 2017-07-01 NOTE — Care Management (Signed)
Spoke with patient's daughter, [name], to inform her of bed availability at [facility].  [name] chooses for patient to go to [facility] and cancel home hospice services.  Audrea Muscat, RN with [facility] made aware of choice.  Medical Necessity form completed and left on chart for RN to call [facility] when patient ready for transport.  Text sent to Dr. Erlinda Hong to advise of change of plans.

## 2017-07-02 DIAGNOSIS — I132 Hypertensive heart and chronic kidney disease with heart failure and with stage 5 chronic kidney disease, or end stage renal disease: Secondary | ICD-10-CM | POA: Diagnosis not present

## 2017-07-02 DIAGNOSIS — E1122 Type 2 diabetes mellitus with diabetic chronic kidney disease: Secondary | ICD-10-CM | POA: Diagnosis not present

## 2017-07-02 DIAGNOSIS — N186 End stage renal disease: Secondary | ICD-10-CM | POA: Diagnosis not present

## 2017-07-02 DIAGNOSIS — I5032 Chronic diastolic (congestive) heart failure: Secondary | ICD-10-CM | POA: Diagnosis not present

## 2017-07-02 LAB — TYPE AND SCREEN
ABO/RH(D): B POS
Antibody Screen: NEGATIVE
UNIT DIVISION: 0
UNIT DIVISION: 0
UNIT DIVISION: 0
Unit division: 0
Unit division: 0
Unit division: 0

## 2017-07-02 LAB — BPAM RBC
BLOOD PRODUCT EXPIRATION DATE: 201907222359
BLOOD PRODUCT EXPIRATION DATE: 201907252359
BLOOD PRODUCT EXPIRATION DATE: 201907252359
Blood Product Expiration Date: 201907222359
Blood Product Expiration Date: 201907232359
Blood Product Expiration Date: 201907252359
ISSUE DATE / TIME: 201906280153
ISSUE DATE / TIME: 201906280443
UNIT TYPE AND RH: 7300
UNIT TYPE AND RH: 7300
UNIT TYPE AND RH: 7300
UNIT TYPE AND RH: 7300
UNIT TYPE AND RH: 7300
Unit Type and Rh: 7300

## 2017-07-19 ENCOUNTER — Ambulatory Visit: Payer: Medicare Other | Admitting: Cardiology

## 2017-08-02 DEATH — deceased

## 2018-06-16 IMAGING — CT CT ABDOMEN WO/W CM
2 of 10 series · 9 of 46 positions shown, 15 images · IV contrast (ISOVUE 370)
Comparison: 06/08/2015

CLINICAL DATA: Right renal mass/angiomyolipoma, history of left
renal cancer status post nephrectomy.

EXAM:
CT ABDOMEN WITHOUT AND WITH CONTRAST
TECHNIQUE: Multidetector CT imaging of the abdomen was performed following the
standard protocol before and following the bolus administration of
intravenous contrast.
CONTRAST:  100mL [id number] IOPAMIDOL ([id number]) INJECTION 76%

[Series 3: coronal pre · coronal · non-contrast · 0.56mm/px · 2 of 74 slices shown, 3 images]
[im 25/74  soft-tissue]
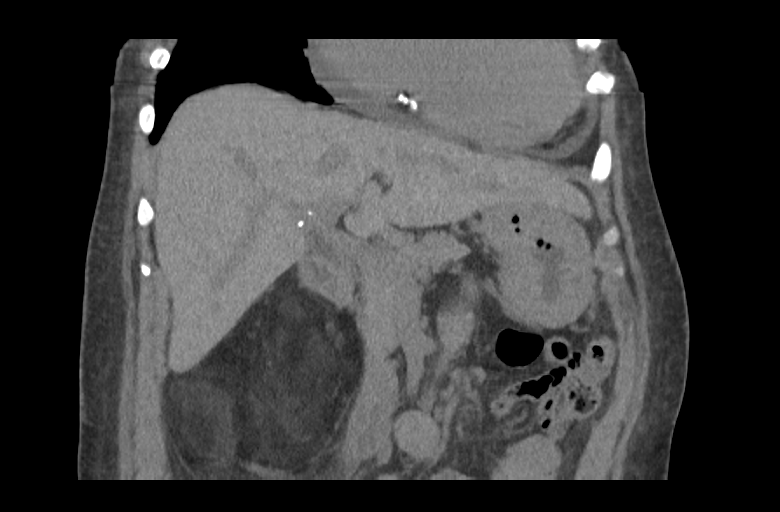
[im 25/74  bone]
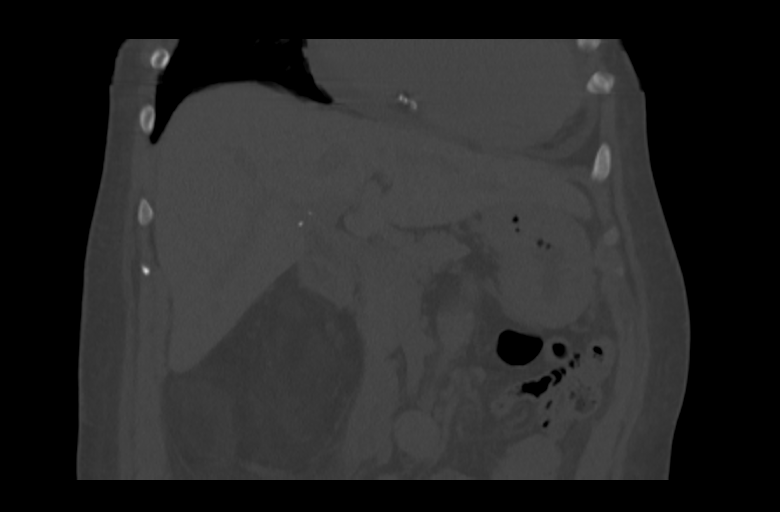
[im 49/74  soft-tissue]
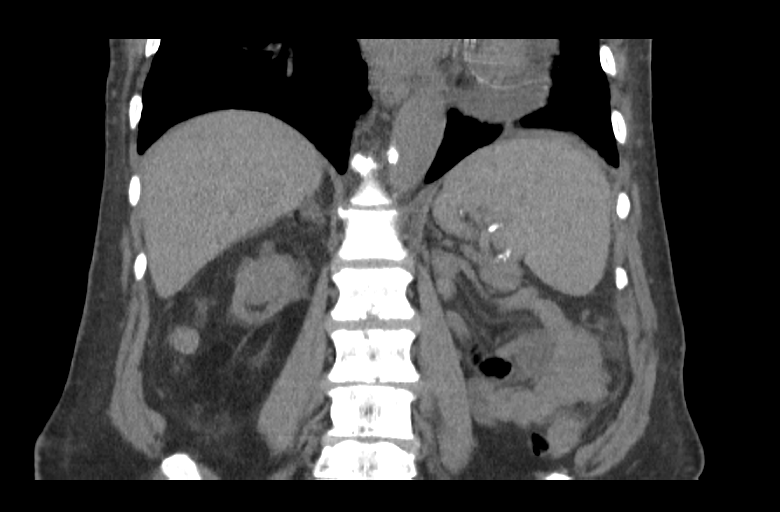

[Series 5: axial arterial · axial · arterial · 0.73mm/px · z∈[+1284,+1464]mm · 7 of 82 slices shown, 12 images]
[im 11/82  soft-tissue]
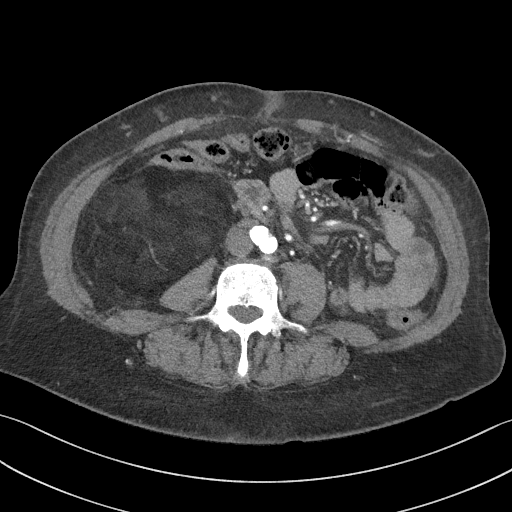
[im 11/82  bone]
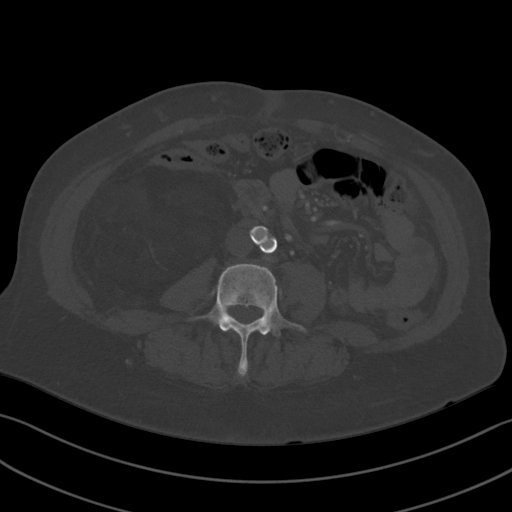
[im 21/82  soft-tissue]
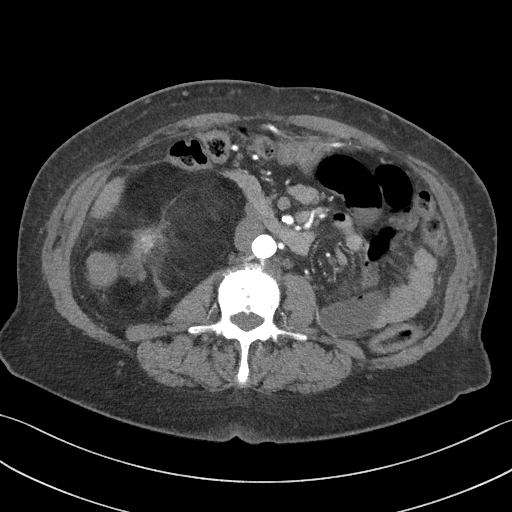
[im 31/82  soft-tissue]
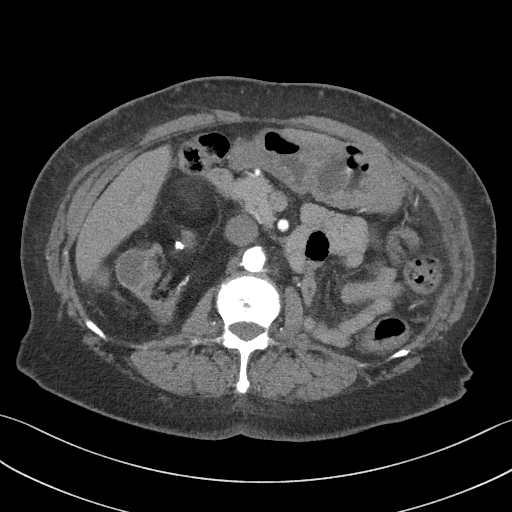
[im 41/82  soft-tissue]
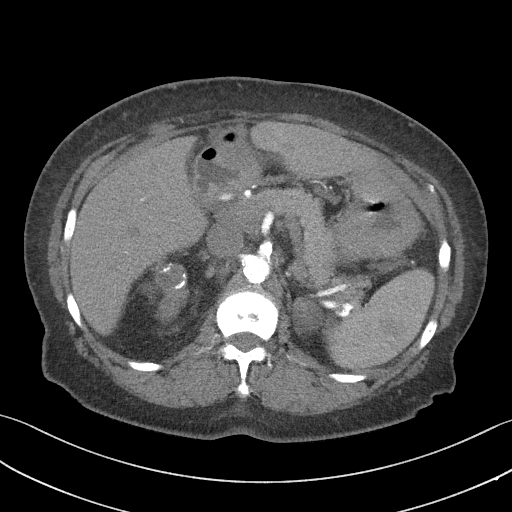
[im 41/82  lung]
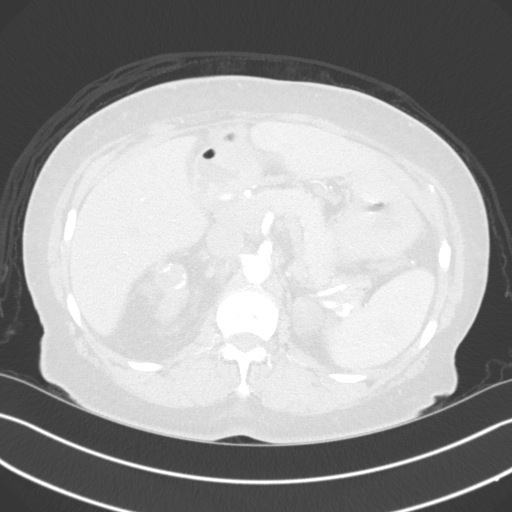
[im 51/82  soft-tissue]
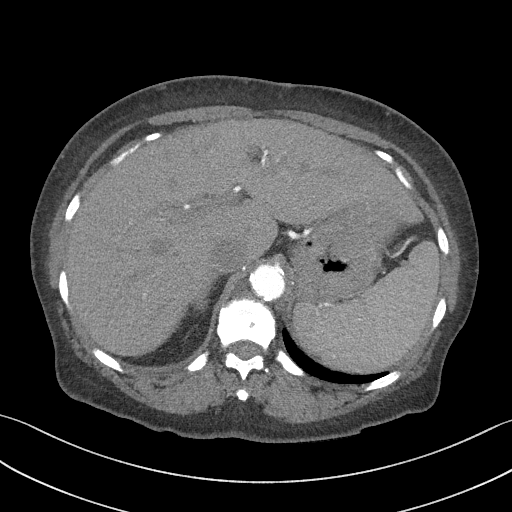
[im 51/82  lung]
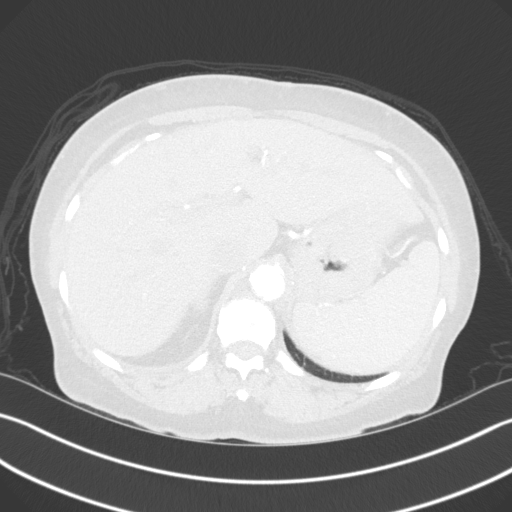
[im 61/82  soft-tissue]
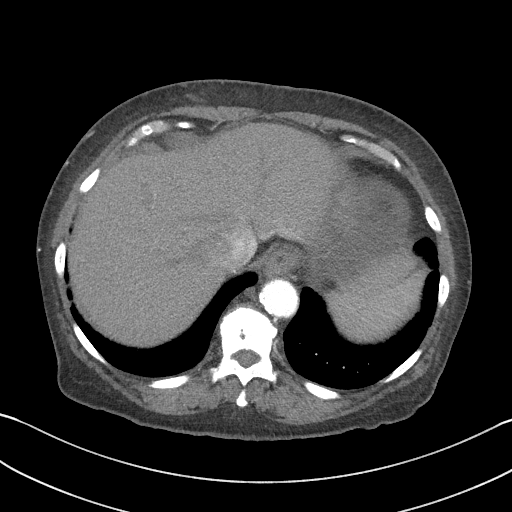
[im 61/82  lung]
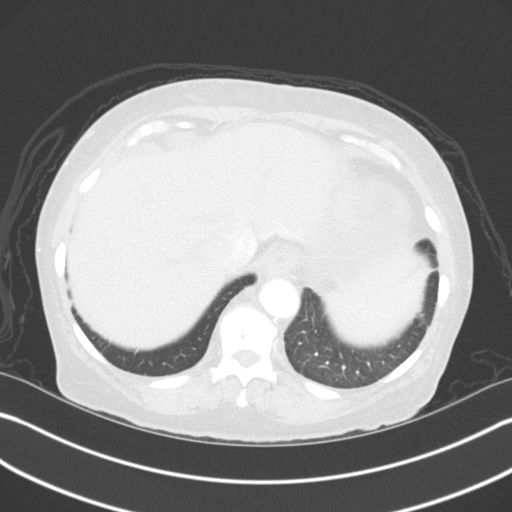
[im 71/82  soft-tissue]
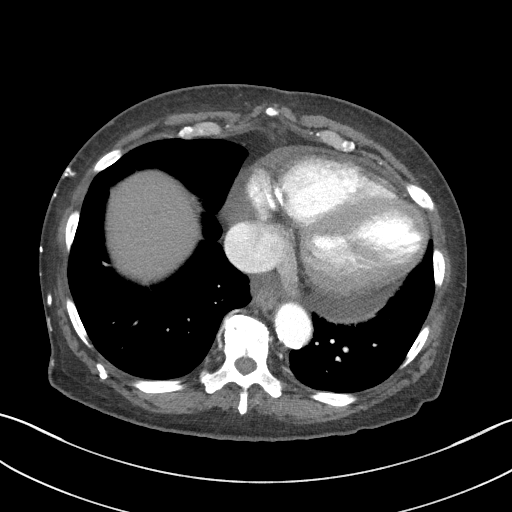
[im 71/82  lung]
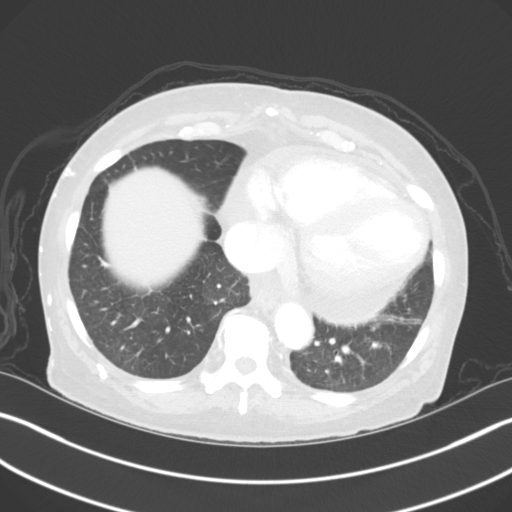

[9 of 46 positions shown; findings below may reference images not displayed]

FINDINGS: Lower chest: Lung bases are essentially clear.

Cardiomegaly. Small inferior pericardial effusion. Mitral valve
annular calcifications. Coronary atherosclerosis of the right
coronary artery.

Hepatobiliary: Liver is within normal limits.

Status post cholecystectomy. No intrahepatic or extrahepatic ductal
dilatation.

Pancreas: Within normal limits.

Spleen: Within normal limits.

Adrenals/Urinary Tract: Right adrenal glands within normal limits.
Left adrenal gland is surgically absent.

Status post left nephrectomy.

Right renal cortical scarring/atrophy. No hydronephrosis. Multiple
right renal lesions, including:

--simple cysts, including a dominant 2.8 x 2.3 cm simple cyst in the
lateral right upper kidney (series 5/image 51), benign (Bosniak I).

--hemorrhagic cysts, including a 3.5 x 2.8 cm hemorrhagic cyst in
the posterior right upper kidney (series 2/image 48) and a 4.4 x
cm hemorrhagic cyst in the lateral right lower kidney (series
2/image 57), benign (Bosniak II).

--complex cyst, including a 1.7 x 2.2 x 1.9 cm rim calcified lesion
with progressive enhancement in the anterior right upper kidney
(series 5/image 42), raising concern for cystic renal neoplasm
(Bosniak III), previously 1.7 x 2.0 x 1.7 cm

--suspected fat predominant angiomyolipoma, with small enhancing
soft tissue component along the anterior right lower kidney (series
5/image 61), measuring at least 9.6 x 14.3 x 10.6 cm (incompletely
visualized craniocaudally), previously 7.9 x 11.0 x 9.7 cm

Stomach/Bowel: Stomach is within normal limits.

Visualized bowel is grossly unremarkable, noting left colonic
diverticulosis.

Vascular/Lymphatic: No evidence of abdominal aortic aneurysm.

Atherosclerotic calcifications of the abdominal aorta and branch
vessels.

No suspicious abdominal lymphadenopathy.

Other: No abdominal ascites.

Musculoskeletal: Degenerative changes of the visualized
thoracolumbar spine. Renal osteodystrophy.
IMPRESSION: 2.2 cm enhancing complex cystic lesion in the anterior right upper
kidney, mildly increased from 7112, raising concern for cystic renal
neoplasm (Bosniak III).

14.3 cm fat predominant angiomyolipoma along the right lower kidney,
previously 11.0 cm, significantly increased.

Additional right renal cysts, as described above, benign (Bosniak
I-II).

Status post radical left nephrectomy. No evidence of recurrent or
metastatic disease.

## 2018-07-21 IMAGING — DX DG CHEST 1V PORT
1 series · 1 of 1 positions shown · non-contrast
Comparison: 04/16/2017 and 11/20/2016 chest x-ray.

CLINICAL DATA: 71-year-old female with end-stage renal disease on
dialysis. Postop. Subsequent encounter.

EXAM:
PORTABLE CHEST 1 VIEW

[chest ap]
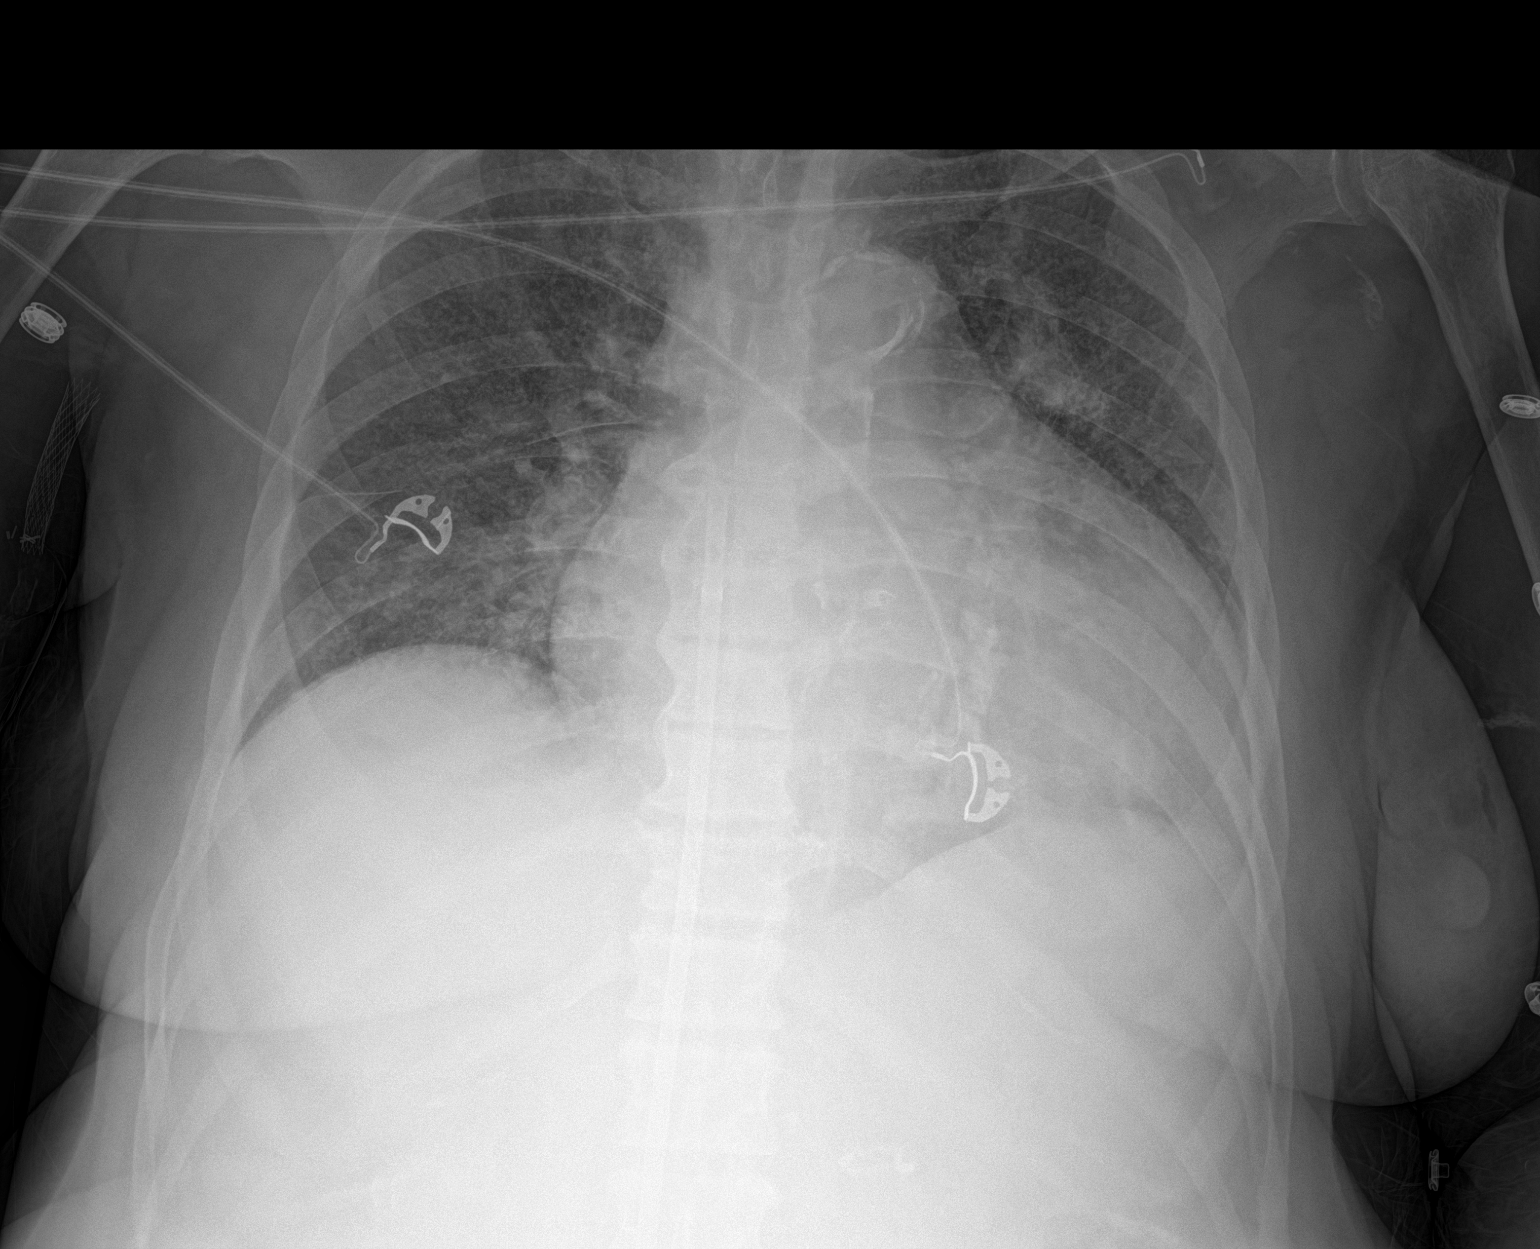

[1 of 1 positions shown; findings below may reference images not displayed]

FINDINGS: Catheter extends from below diaphragm courses over the heart with
the tip projecting at the level of the distal superior vena cava
just above the expected level of the cavoatrial junction.

Cardiomegaly.  Mitral valve calcifications.

Chronic lung changes with superimposed pulmonary vascular
congestion.

Calcified aorta.

No pneumothorax.
IMPRESSION: Catheter extends from below diaphragm courses over the heart with
the tip projecting at the level of the distal superior vena cava
just above the expected level of the cavoatrial junction.

Cardiomegaly. Mitral valve calcifications.

Chronic lung changes with superimposed pulmonary vascular
congestion.

Aortic Atherosclerosis (8TDCG-89P.P).

## 2018-07-24 IMAGING — DX DG CHEST 1V PORT
1 series · 1 of 1 positions shown · non-contrast
Comparison: 04/17/2017

CLINICAL DATA: Shortness of breath, tachycardia, swelling

EXAM:
PORTABLE CHEST 1 VIEW

[chest]
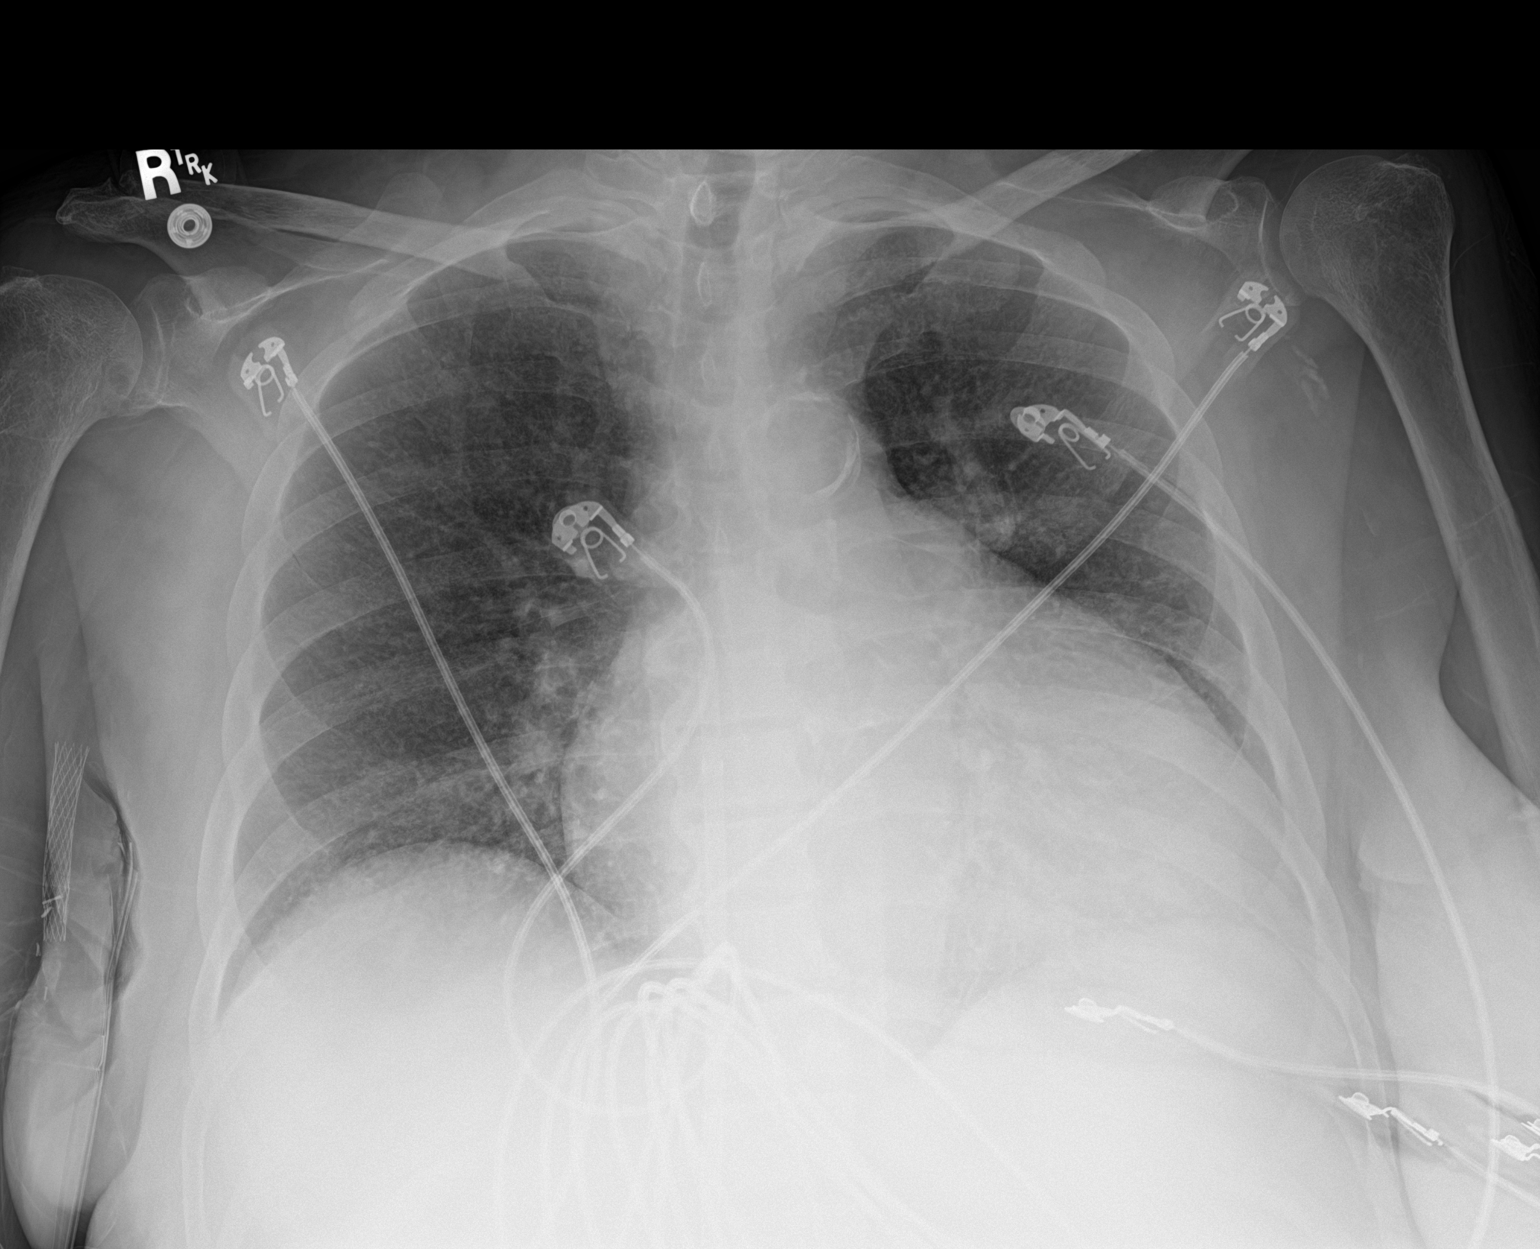

[1 of 1 positions shown; findings below may reference images not displayed]

FINDINGS: There is bilateral interstitial thickening. There is no focal
consolidation. There is no pleural effusion or pneumothorax. There
is stable cardiomegaly. There is a central venous catheter coursing
from the lower abdomen with the tip terminating in the lower SVC.

The osseous structures are unremarkable.
IMPRESSION: Cardiomegaly with mild pulmonary vascular congestion.

## 2021-02-25 NOTE — Progress Notes (Signed)
X °
